# Patient Record
Sex: Female | Born: 1950 | Race: Black or African American | Hispanic: No | Smoking: Former smoker
Health system: Southern US, Community
[De-identification: ages and names within clinical notes are randomized; demographics above are authoritative.]

## PROBLEM LIST (undated history)

## (undated) DIAGNOSIS — I272 Pulmonary hypertension, unspecified: Secondary | ICD-10-CM

## (undated) DIAGNOSIS — M1990 Unspecified osteoarthritis, unspecified site: Secondary | ICD-10-CM

## (undated) DIAGNOSIS — I2510 Atherosclerotic heart disease of native coronary artery without angina pectoris: Secondary | ICD-10-CM

## (undated) DIAGNOSIS — E1169 Type 2 diabetes mellitus with other specified complication: Secondary | ICD-10-CM

## (undated) DIAGNOSIS — I5022 Chronic systolic (congestive) heart failure: Secondary | ICD-10-CM

## (undated) DIAGNOSIS — R0600 Dyspnea, unspecified: Secondary | ICD-10-CM

## (undated) DIAGNOSIS — IMO0002 Reserved for concepts with insufficient information to code with codable children: Secondary | ICD-10-CM

## (undated) DIAGNOSIS — I10 Essential (primary) hypertension: Secondary | ICD-10-CM

## (undated) DIAGNOSIS — E669 Obesity, unspecified: Secondary | ICD-10-CM

## (undated) DIAGNOSIS — R0601 Orthopnea: Secondary | ICD-10-CM

## (undated) DIAGNOSIS — I340 Nonrheumatic mitral (valve) insufficiency: Secondary | ICD-10-CM

## (undated) DIAGNOSIS — E785 Hyperlipidemia, unspecified: Secondary | ICD-10-CM

## (undated) DIAGNOSIS — G5603 Carpal tunnel syndrome, bilateral upper limbs: Secondary | ICD-10-CM

## (undated) DIAGNOSIS — R9430 Abnormal result of cardiovascular function study, unspecified: Secondary | ICD-10-CM

## (undated) DIAGNOSIS — I255 Ischemic cardiomyopathy: Secondary | ICD-10-CM

## (undated) DIAGNOSIS — I509 Heart failure, unspecified: Secondary | ICD-10-CM

## (undated) DIAGNOSIS — J90 Pleural effusion, not elsewhere classified: Secondary | ICD-10-CM

## (undated) DIAGNOSIS — I219 Acute myocardial infarction, unspecified: Secondary | ICD-10-CM

## (undated) HISTORY — DX: Type 2 diabetes mellitus with other specified complication: E11.69

## (undated) HISTORY — DX: Acute myocardial infarction, unspecified: I21.9

## (undated) HISTORY — DX: Pulmonary hypertension, unspecified: I27.20

## (undated) HISTORY — DX: Reserved for concepts with insufficient information to code with codable children: IMO0002

## (undated) HISTORY — DX: Morbid (severe) obesity due to excess calories: E66.01

## (undated) HISTORY — DX: Ischemic cardiomyopathy: I25.5

## (undated) HISTORY — PX: LAPAROSCOPY: SHX197

## (undated) HISTORY — DX: Chronic systolic (congestive) heart failure: I50.22

## (undated) HISTORY — DX: Hyperlipidemia, unspecified: E78.5

## (undated) HISTORY — PX: CHOLECYSTECTOMY: SHX55

## (undated) HISTORY — DX: Atherosclerotic heart disease of native coronary artery without angina pectoris: I25.10

## (undated) HISTORY — DX: Abnormal result of cardiovascular function study, unspecified: R94.30

## (undated) HISTORY — DX: Heart failure, unspecified: I50.9

## (undated) HISTORY — DX: Obesity, unspecified: E66.9

---

## 2011-08-09 ENCOUNTER — Inpatient Hospital Stay (HOSPITAL_COMMUNITY): Payer: Managed Care, Other (non HMO)

## 2011-08-09 ENCOUNTER — Inpatient Hospital Stay (HOSPITAL_COMMUNITY)
Admission: EM | Admit: 2011-08-09 | Discharge: 2011-08-26 | DRG: 231 | Disposition: A | Discharge status: Home Health | Payer: Managed Care, Other (non HMO) | Attending: Thoracic Surgery (Cardiothoracic Vascular Surgery) | Admitting: Thoracic Surgery (Cardiothoracic Vascular Surgery)

## 2011-08-09 ENCOUNTER — Emergency Department (HOSPITAL_COMMUNITY): Payer: Managed Care, Other (non HMO)

## 2011-08-09 DIAGNOSIS — E785 Hyperlipidemia, unspecified: Secondary | ICD-10-CM | POA: Diagnosis present

## 2011-08-09 DIAGNOSIS — D72829 Elevated white blood cell count, unspecified: Secondary | ICD-10-CM | POA: Diagnosis not present

## 2011-08-09 DIAGNOSIS — I509 Heart failure, unspecified: Secondary | ICD-10-CM | POA: Diagnosis present

## 2011-08-09 DIAGNOSIS — I2510 Atherosclerotic heart disease of native coronary artery without angina pectoris: Secondary | ICD-10-CM | POA: Diagnosis present

## 2011-08-09 DIAGNOSIS — I214 Non-ST elevation (NSTEMI) myocardial infarction: Principal | ICD-10-CM | POA: Diagnosis present

## 2011-08-09 DIAGNOSIS — E1169 Type 2 diabetes mellitus with other specified complication: Secondary | ICD-10-CM | POA: Diagnosis present

## 2011-08-09 DIAGNOSIS — J9819 Other pulmonary collapse: Secondary | ICD-10-CM | POA: Diagnosis present

## 2011-08-09 DIAGNOSIS — I472 Ventricular tachycardia, unspecified: Secondary | ICD-10-CM | POA: Diagnosis present

## 2011-08-09 DIAGNOSIS — I5023 Acute on chronic systolic (congestive) heart failure: Secondary | ICD-10-CM | POA: Diagnosis present

## 2011-08-09 DIAGNOSIS — D62 Acute posthemorrhagic anemia: Secondary | ICD-10-CM | POA: Diagnosis not present

## 2011-08-09 DIAGNOSIS — K089 Disorder of teeth and supporting structures, unspecified: Secondary | ICD-10-CM | POA: Diagnosis present

## 2011-08-09 DIAGNOSIS — E876 Hypokalemia: Secondary | ICD-10-CM | POA: Diagnosis present

## 2011-08-09 DIAGNOSIS — N390 Urinary tract infection, site not specified: Secondary | ICD-10-CM | POA: Diagnosis not present

## 2011-08-09 DIAGNOSIS — K5900 Constipation, unspecified: Secondary | ICD-10-CM | POA: Diagnosis not present

## 2011-08-09 DIAGNOSIS — E7800 Pure hypercholesterolemia, unspecified: Secondary | ICD-10-CM | POA: Diagnosis present

## 2011-08-09 DIAGNOSIS — I959 Hypotension, unspecified: Secondary | ICD-10-CM | POA: Diagnosis not present

## 2011-08-09 DIAGNOSIS — B961 Klebsiella pneumoniae [K. pneumoniae] as the cause of diseases classified elsewhere: Secondary | ICD-10-CM | POA: Diagnosis not present

## 2011-08-09 DIAGNOSIS — I519 Heart disease, unspecified: Secondary | ICD-10-CM | POA: Diagnosis present

## 2011-08-09 DIAGNOSIS — Z87891 Personal history of nicotine dependence: Secondary | ICD-10-CM

## 2011-08-09 DIAGNOSIS — I4729 Other ventricular tachycardia: Secondary | ICD-10-CM | POA: Diagnosis present

## 2011-08-09 DIAGNOSIS — R079 Chest pain, unspecified: Secondary | ICD-10-CM

## 2011-08-09 LAB — COMPREHENSIVE METABOLIC PANEL
ALT: 40 U/L — ABNORMAL HIGH (ref 0–35)
AST: 66 U/L — ABNORMAL HIGH (ref 0–37)
Albumin: 3.9 g/dL (ref 3.5–5.2)
Alkaline Phosphatase: 133 U/L — ABNORMAL HIGH (ref 39–117)
CO2: 24 mEq/L (ref 19–32)
Chloride: 98 mEq/L (ref 96–112)
GFR calc non Af Amer: >60 mL/min (ref >60)
Potassium: 4.1 mEq/L (ref 3.5–5.1)
Total Bilirubin: 0.6 mg/dL (ref 0.3–1.2)

## 2011-08-09 LAB — DIFFERENTIAL
Basophils Absolute: 0 10*3/uL (ref 0.0–0.1)
Eosinophils Absolute: 0 10*3/uL (ref 0.0–0.7)
Eosinophils Relative: 0 % (ref 0–5)
Lymphocytes Relative: 9 % — ABNORMAL LOW (ref 12–46)
Lymphs Abs: 0.7 10*3/uL (ref 0.7–4.0)
Neutrophils Relative %: 89 % — ABNORMAL HIGH (ref 43–77)

## 2011-08-09 LAB — CBC
Platelets: 267 10*3/uL (ref 150–400)
RBC: 5.04 MIL/uL (ref 3.87–5.11)
RDW: 12.7 % (ref 11.5–15.5)
WBC: 8.3 10*3/uL (ref 4.0–10.5)

## 2011-08-09 LAB — PROTIME-INR
INR: 0.98 (ref 0.00–1.49)
Prothrombin Time: 13.2 seconds (ref 11.6–15.2)

## 2011-08-09 LAB — LIPID PANEL
HDL: 61 mg/dL (ref >39)
LDL Cholesterol: 294 mg/dL — ABNORMAL HIGH (ref 0–99)
Total CHOL/HDL Ratio: 6.1 RATIO
Triglycerides: 94 mg/dL (ref <150)
VLDL: 19 mg/dL (ref 0–40)

## 2011-08-09 LAB — CK TOTAL AND CKMB (NOT AT ARMC)
Relative Index: 9.9 — ABNORMAL HIGH (ref 0.0–2.5)
Relative Index: 10 — ABNORMAL HIGH (ref 0.0–2.5)

## 2011-08-09 LAB — TROPONIN I: Troponin I: 3.77 ng/mL (ref <0.30)

## 2011-08-09 LAB — MAGNESIUM: Magnesium: 1.8 mg/dL (ref 1.5–2.5)

## 2011-08-10 DIAGNOSIS — E1169 Type 2 diabetes mellitus with other specified complication: Secondary | ICD-10-CM

## 2011-08-10 DIAGNOSIS — E669 Obesity, unspecified: Secondary | ICD-10-CM

## 2011-08-10 DIAGNOSIS — I059 Rheumatic mitral valve disease, unspecified: Secondary | ICD-10-CM

## 2011-08-10 DIAGNOSIS — I2510 Atherosclerotic heart disease of native coronary artery without angina pectoris: Secondary | ICD-10-CM

## 2011-08-10 DIAGNOSIS — E785 Hyperlipidemia, unspecified: Secondary | ICD-10-CM

## 2011-08-10 HISTORY — DX: Type 2 diabetes mellitus with other specified complication: E66.9

## 2011-08-10 HISTORY — DX: Type 2 diabetes mellitus with other specified complication: E11.69

## 2011-08-10 HISTORY — DX: Hyperlipidemia, unspecified: E78.5

## 2011-08-10 LAB — CARDIAC PANEL(CRET KIN+CKTOT+MB+TROPI)
CK, MB: 118.7 ng/mL (ref 0.3–4.0)
Total CK: 2426 U/L — ABNORMAL HIGH (ref 7–177)
Troponin I: >25 ng/mL (ref <0.30)

## 2011-08-10 LAB — GLUCOSE, CAPILLARY
Glucose-Capillary: 320 mg/dL — ABNORMAL HIGH (ref 70–99)
Glucose-Capillary: 252 mg/dL — ABNORMAL HIGH (ref 70–99)
Glucose-Capillary: 297 mg/dL — ABNORMAL HIGH (ref 70–99)

## 2011-08-10 LAB — BASIC METABOLIC PANEL
CO2: 27 mEq/L (ref 19–32)
Chloride: 100 mEq/L (ref 96–112)
Creatinine, Ser: 0.68 mg/dL (ref 0.50–1.10)
Glucose, Bld: 343 mg/dL — ABNORMAL HIGH (ref 70–99)

## 2011-08-10 LAB — CBC
MCHC: 35 g/dL (ref 30.0–36.0)
MCV: 88.9 fL (ref 78.0–100.0)
Platelets: 232 10*3/uL (ref 150–400)
RDW: 12.7 % (ref 11.5–15.5)
WBC: 12.1 10*3/uL — ABNORMAL HIGH (ref 4.0–10.5)

## 2011-08-10 LAB — LIPID PANEL
HDL: 59 mg/dL (ref >39)
Total CHOL/HDL Ratio: 5.8 RATIO

## 2011-08-10 LAB — PRO B NATRIURETIC PEPTIDE: Pro B Natriuretic peptide (BNP): 1852 pg/mL — ABNORMAL HIGH (ref 0–125)

## 2011-08-10 LAB — URINALYSIS, DIPSTICK ONLY
Ketones, ur: NEGATIVE mg/dL
Protein, ur: NEGATIVE mg/dL
Urobilinogen, UA: 1 mg/dL (ref 0.0–1.0)

## 2011-08-10 LAB — HEPARIN LEVEL (UNFRACTIONATED): Heparin Unfractionated: 0.43 IU/mL (ref 0.30–0.70)

## 2011-08-10 LAB — HEMOGLOBIN A1C: Mean Plasma Glucose: 249 mg/dL — ABNORMAL HIGH (ref <117)

## 2011-08-11 ENCOUNTER — Inpatient Hospital Stay (HOSPITAL_COMMUNITY): Payer: Managed Care, Other (non HMO)

## 2011-08-11 LAB — COMPREHENSIVE METABOLIC PANEL
ALT: 50 U/L — ABNORMAL HIGH (ref 0–35)
AST: 188 U/L — ABNORMAL HIGH (ref 0–37)
Alkaline Phosphatase: 94 U/L (ref 39–117)
CO2: 31 mEq/L (ref 19–32)
Calcium: 9 mg/dL (ref 8.4–10.5)
GFR calc Af Amer: >60 mL/min (ref >60)
GFR calc non Af Amer: >60 mL/min (ref >60)
Glucose, Bld: 157 mg/dL — ABNORMAL HIGH (ref 70–99)
Potassium: 3.3 mEq/L — ABNORMAL LOW (ref 3.5–5.1)
Sodium: 136 mEq/L (ref 135–145)

## 2011-08-11 LAB — BLOOD GAS, ARTERIAL
Acid-Base Excess: 5.9 mmol/L — ABNORMAL HIGH (ref 0.0–2.0)
FIO2: 0.4 %
MECHVT: 550 mL
O2 Content: 3 L/min
O2 Saturation: 90.6 %
O2 Saturation: 97.7 %
PEEP: 5 cmH2O
Patient temperature: 98.6
Patient temperature: 99.3
RATE: 14 resp/min
pH, Arterial: 7.457 — ABNORMAL HIGH (ref 7.350–7.400)
pO2, Arterial: 59.6 mmHg — ABNORMAL LOW (ref 80.0–100.0)

## 2011-08-11 LAB — CBC
Hemoglobin: 13.3 g/dL (ref 12.0–15.0)
Platelets: 214 10*3/uL (ref 150–400)
RBC: 4.29 MIL/uL (ref 3.87–5.11)
WBC: 9.7 10*3/uL (ref 4.0–10.5)

## 2011-08-11 LAB — PROTIME-INR
INR: 1.11 (ref 0.00–1.49)
Prothrombin Time: 14.5 seconds (ref 11.6–15.2)

## 2011-08-11 LAB — GLUCOSE, CAPILLARY
Glucose-Capillary: 299 mg/dL — ABNORMAL HIGH (ref 70–99)
Glucose-Capillary: 187 mg/dL — ABNORMAL HIGH (ref 70–99)
Glucose-Capillary: 144 mg/dL — ABNORMAL HIGH (ref 70–99)
Glucose-Capillary: 171 mg/dL — ABNORMAL HIGH (ref 70–99)
Glucose-Capillary: 152 mg/dL — ABNORMAL HIGH (ref 70–99)

## 2011-08-11 LAB — APTT: aPTT: 52 seconds — ABNORMAL HIGH (ref 24–37)

## 2011-08-11 NOTE — Consult Note (Signed)
Christine Knight, Christine Knight NO.:  [id number]  MEDICAL RECORD NO.:  [id number]  LOCATION:  2903                         FACILITY:  [facility]  PHYSICIAN:  [name], M.D. DATE OF BIRTH:  October 17, 1951  DATE OF CONSULTATION:  08/10/2011 DATE OF DISCHARGE:                                CONSULTATION   REQUESTING PHYSICIAN:  Dr. Deloris Ping. Nahser  REASON FOR CONSULTATION:  Severe three-vessel coronary artery disease status post acute myocardial infarction.  HISTORY OF PRESENT ILLNESS:  The patient is a 60 year old obese African American female from [place] who has not seen a physician in many years.  The patient denies any known history of cardiac disease, and she also denies any history of diabetes, hypertension, or hyperlipidemia. The patient states she was in her usual state of health until 3 days ago when she first began to develop severe substernal chest pain and epigastric pain that initially she attributed to indigestion.  The painwas associated with nausea, vomiting, and diarrhea.  The pain persisted for the next 48 hours without relief and began to become associated with shortness of breath.  She finally presented to the hospital where baseline electrocardiograms revealed sinus rhythm with nonspecific ST and T-wave changes.  However, cardiac enzymes were markedly elevated with troponin levels greater than 25.  The patient was admitted to the Cardiology Service.  She continued to have chest pain off and on and was subsequently brought to the cardiac cath lab earlier today by Dr. Elease Hashimoto and Dr. Nanetta Batty.  The patient was found to have severe three- vessel coronary artery disease with subtotal occlusion of the left circumflex coronary artery representing likely culprit vessel.  The patient underwent primary balloon angioplasty of the left circumflex coronary artery with good result.  Intra-aortic balloon pump was placed during the procedure.  The patient  was noted to have severe left ventricular dysfunction.  Cardiothoracic surgical consultation was requested.  REVIEW OF SYSTEMS:  The patient claims that she was in her usual state of health until 3 days ago.  She states that prior to that she had good energy and normal appetite.  CARDIAC:  The patient does admit that in retrospect she has been having intermittent episodic pain in her lower chest and epigastric region that she has attributed to indigestion. This has been going off and on for sometime.  The patient has stable mild exertional shortness of breath prior to this acute event.  Her breathing is much better since she returned from the cath lab today. She denies resting shortness of breath, PND, orthopnea, or syncope. RESPIRATORY:  Notable for some recent productive cough.  The patient states she had a viral illness.  She denies hemoptysis or wheezing. GASTROINTESTINAL:  The patient had nausea, vomiting, and diarrhea during her acute illness over the last few days.  She denies any ongoing abdominal pain at present and states that her bowels have settled down. She denies any history of difficulty swallowing.  NEUROLOGIC:  Negative. The patient denies symptoms suggestive of previous TIA or stroke. GENITOURINARY:  Negative.  HEENT:  Negative.  INFECTIOUS:  Negative.  PAST MEDICAL HISTORY:  The patient denies any known history of coronary artery disease, hypertension,  hyperlipidemia, or diabetes.  Since hospital admission, the patient has been found to have completely uncontrolled type 2 diabetes mellitus as well as severe hyperlipidemia and congestive heart failure.  The patient also is morbidly obese.  PAST SURGICAL HISTORY:  Cholecystectomy.  FAMILY HISTORY:  Noncontributory.  MEDICATIONS PRIOR TO ADMISSION:  None other than over-the-counter Mucinex.  DRUG ALLERGIES:  None known.  SOCIAL HISTORY:  The patient is a morbidly obese African American female who lives in  [place].  She works for [employer] [place].  She has a remote history of tobacco use but quit smoking 30 years ago.  She does not use alcohol.  She lives a sedentary lifestyle.  She lives with her son.  PHYSICAL EXAMINATION:  GENERAL:  The patient is morbidly obese African American female who appears her stated age in no acute distress.  She is in sinus rhythm. HEENT:  Grossly unrevealing. NECK:  Supple.  There are no carotid bruits. CHEST:  Auscultation of the chest demonstrates coarse rhonchi in both lung fields.  Breath sounds are symmetrical. CARDIOVASCULAR:  Notable for regular rate and rhythm.  No murmurs, rubs, or gallops noted. ABDOMEN:  Obese but soft, nontender.  Bowel sounds are present. EXTREMITIES:  Warm and adequately perfused.  There is mild bilateral lower extremity edema.  Distal pulses are not palpable in either lower leg.  Intra-aortic balloon pump was in place and functioning normally.  DIAGNOSTIC TEST:  A 2-D echocardiogram and left heart catheterization performed earlier today are both reviewed.  Echocardiogram demonstrates severe left ventricular dysfunction with akinesis of the inferior and lateral wall.  There is mild mitral regurgitation.  There is severe left ventricular chamber enlargement with severe left ventricular dysfunction.  The aortic valve appears normal.  Cardiac catheterization demonstrates severe three-vessel coronary artery disease with severe left ventricular dysfunction.  There is long segment 90% stenosis of the mid-left anterior descending coronary artery beginning just prior to and after takeoff of the diagonal branch.  There is 90% ostial stenosis of the diagonal branch.  The distal left anterior descending coronary artery is diffusely diseased and has numerous areas of high-grade narrowing.  The left circumflex coronary artery gives rise to a single large dominant obtuse marginal branch.  There is 99% subtotal occlusion of the proximal  vessel with thrombus and TIMI 2 flow in the distal vessel.  There is right dominant coronary circulation. There is long segment 70-80% stenosis of the proximal right coronary artery with 100% occlusion of the distal right coronary artery.  The posterior descending coronary artery and the terminal branch of the right coronary artery appeared diffusely disease and fill only via collaterals.  There is severe left ventricular dysfunction with ejection fraction estimated less than or equal to 20%.  Left ventricular end- diastolic pressure is 37.  There may be some mitral regurgitation.  IMPRESSION:  Severe three-vessel coronary artery disease with ischemic cardiomyopathy, status post acute lateral wall myocardial infarction, complicated by congestive heart failure.  The patient has undergone successful primary balloon angioplasty of the culprit vessel and is currently pain free and appears substantially improved.  She clearly has likely preexisting ischemic cardiomyopathy with previous occlusion of the right coronary artery in the remote past and diffuse disease involving the left anterior descending coronary artery as well.  The patient has relatively poor target vessels for grafting and risks associated with surgical revascularization will be exceptionally high. Unfortunately, there are no alternatives as the outlook with primary medical therapy would be dismal and there  are no reasonable options for any other percutaneous approach to her revascularization.  I agree that she needs coronary artery bypass grafting.  At this point, the only question will be timing in that she would clearly benefit from time to recover from her acute event as long as she remains clinically stable. Finally, she suffers from morbid obesity as well as completely uncontrolled diabetes mellitus and hyperlipidemia.  Her medical problems will be of critical importance if she is able to survive  surgical revascularization as her long-term outlook will still be remain guarded at best under any circumstances.  PLAN:  I have discussed matters at length with Ms. Tortora and her family at the bedside.  The rationale for surgical revascularization has been discussed.  The high-risk nature of the surgery has been discussed. All their questions have been addressed.  She understands and accepts all potential associated risks including but not limited to risk of death, stroke, myocardial infarction, congestive heart failure, respiratory failure, renal failure, bleeding requiring blood transfusion, arrhythmia, infection, late recurrence of coronary artery disease and/or congestive heart failure.  We will continue to follow along closely, make final plans for surgery depending on her clinical course in the near future.     [name], M.D.     CHO/MEDQ  D:  08/10/2011  T:  08/10/2011  Job:  [id number]  cc:   Luis Abed, MD, [facility] Nanetta Batty, M.D. Vesta Mixer, M.D.  Electronically Signed by Tressie Stalker M.D. on 08/11/2011 08:08:00 AM

## 2011-08-11 NOTE — Cardiovascular Report (Signed)
Christine Knight, DALIA NO.:  [id number]  MEDICAL RECORD NO.:  [id number]  LOCATION:  2903                         FACILITY:  [facility]  PHYSICIAN:  Nanetta Batty, M.D.   DATE OF BIRTH:  [birth year]  DATE OF PROCEDURE:  08/10/2011 DATE OF DISCHARGE:                           CARDIAC CATHETERIZATION   PROCEDURES:  Cardiac catheterization/aspiration thrombectomy/cutting balloon atherectomy/placement of intra-aortic balloon pump.  SURGEON:  Nanetta Batty, MD  INDICATIONS:  Ms. Aguado is a 60 year old mildly overweight African American female without prior cardiac history who presented to [facility] on August 10, 2011 with nausea, vomiting, and abdominal pain.  She ultimately ends up involving a non-ST-segment elevation myocardial infarction with no EKG changes.  Because of recurrent pain, she was brought to the cath lab urgently for angiography and potential intervention.  DESCRIPTION OF PROCEDURE:  The patient was brought to the second floor [place] cardiac cath lab urgently in the postabsorptive state.  She was premedicated with p.o. Valium, IV Versed, and fentanyl.  She had received aspirin prior to coming to the cath lab.  Right groin was prepped and shaved in the usual sterile fashion.  Xylocaine 1% was used for local anesthesia.  A 6-French sheath was inserted into the right femoral artery using standard Seldinger technique.  A 6-French right and left Judkins diagnostic catheters as well as French pigtail catheter were used for selective coronary angiography, left ventriculography, subselective left internal mammary artery angiography, and the distal abdominal aortography.  Visipaque dye was used to the entirety of the case.  Retrograde aortic, left ventricular, and pullback pressures were recorded.  HEMODYNAMIC RESULTS: 1. Aortic systolic pressure 111, diastolic pressure 77. 2. Left ventricular systolic pressure 105, end-diastolic pressure  33.  SELECTIVE CORONARY ANGIOGRAPHY: 1. Left main normal. 2. LAD; the LAD had a long segmental 80-90% stenosis in the midportion     at the takeoff of a moderate-sized first diagonal branch which had     a 95% ostial stenosis and 50% segmental proximal stenosis.  There     was 80% segmental stenosis just between the mid and distal third of     the vessel. 3. Left circumflex; this was the "infarct-related vessel" with 99%     stenosis in the mid AV groove with visible thrombus and TIMI 3     flow. 4. Right coronary artery; dominant with an occluded distal RCA with     bidirectional collaterals. 5. Left internal mammary artery; this was subselectively visualized     and this was widely patent.  Suitable for use during coronary     bypass grafting if required. 6. Left ventriculography; RAO left ventriculogram was performed using     20 mL of Visipaque dye at 12 mL per second.  The overall LVEF was     estimated approximately 20% with severe global hypokinesia. 7. Distal abdominal aortography; distal abdominal aortogram was     performed using 20 mL of Visipaque dye at 20 mL per second.  The     renal arteries were widely patent.  The infrarenal abdominal aorta     and iliac bifurcation were free of any significant atherosclerotic     changes and suitable for  placement of an intra-aortic balloon pump     if necessary.  IMPRESSION:  Ms. Flaim has severe three-vessel disease with severe LV dysfunction.  She will need coronary bypass grafting.  Her culprit vessel is her mid circumflex which is a large vessel with TIMI 2 flow. I have discussed the case with Dr. Elease Hashimoto, her attending cardiologist. The plan is to perform percutaneous intervention with aspiration thrombectomy to reestablish antegrade flow in anticipation of coronary bypass grafting.  Intra-aortic balloon pump will be placed for afterload reduction and to augment coronary blood flow.  DESCRIPTION OF PROCEDURE:  The patient  received Angiomax bolus with an ACT of 389.  Total of 190 mL of contrast was administered during the case.  Using a 6-French [id number] guide catheter along with an 0.014 x 190 Asahi soft wire and a 2.0 x 15 Emerge balloon angioplasty was performed. Following this, aspiration thrombectomy was performed with a Pronto aspiration device.  Cutting balloon atherectomy was then performed with a 2.5 x 15 angioscope resulting reduction of a 99% mid AV groove circumflex with TIMI 2 flow to less than 30% with TIMI 3 flow.  The patient tolerated the procedure well.  At the termination of the case, an intra-aortic balloon pump was placed via the right femoral artery at 1:1 counter pulsation.  The patient received 40 mg of Lasix intravenously and a Foley catheter was placed in the cath lab.  Angiomax was turned off and the patient got a bolus of Integrilin and was placed on an Integrilin drip.  She left the lab in stable condition.     Nanetta Batty, M.D.     Cordelia Pen  D:  08/10/2011  T:  08/10/2011  Job:  [id number]  cc:   Second Floor [facility] Vesta Mixer, M.D.  Electronically Signed by Nanetta Batty M.D. on 08/11/2011 11:53:37 AM

## 2011-08-12 DIAGNOSIS — I2510 Atherosclerotic heart disease of native coronary artery without angina pectoris: Secondary | ICD-10-CM

## 2011-08-12 DIAGNOSIS — Z01810 Encounter for preprocedural cardiovascular examination: Secondary | ICD-10-CM

## 2011-08-12 DIAGNOSIS — I219 Acute myocardial infarction, unspecified: Secondary | ICD-10-CM

## 2011-08-12 HISTORY — DX: Acute myocardial infarction, unspecified: I21.9

## 2011-08-12 HISTORY — DX: Atherosclerotic heart disease of native coronary artery without angina pectoris: I25.10

## 2011-08-12 LAB — BASIC METABOLIC PANEL
BUN: 10 mg/dL (ref 6–23)
CO2: 29 mEq/L (ref 19–32)
Calcium: 8.7 mg/dL (ref 8.4–10.5)
Glucose, Bld: 116 mg/dL — ABNORMAL HIGH (ref 70–99)
Sodium: 135 mEq/L (ref 135–145)

## 2011-08-12 LAB — CBC
HCT: 36.7 % (ref 36.0–46.0)
Hemoglobin: 12.6 g/dL (ref 12.0–15.0)
MCH: 30.9 pg (ref 26.0–34.0)
MCHC: 34.3 g/dL (ref 30.0–36.0)
MCV: 90 fL (ref 78.0–100.0)
RDW: 12.6 % (ref 11.5–15.5)

## 2011-08-12 LAB — GLUCOSE, CAPILLARY
Glucose-Capillary: 128 mg/dL — ABNORMAL HIGH (ref 70–99)
Glucose-Capillary: 116 mg/dL — ABNORMAL HIGH (ref 70–99)
Glucose-Capillary: 124 mg/dL — ABNORMAL HIGH (ref 70–99)
Glucose-Capillary: 173 mg/dL — ABNORMAL HIGH (ref 70–99)

## 2011-08-12 LAB — URINE CULTURE: Culture: NO GROWTH

## 2011-08-12 LAB — SURGICAL PCR SCREEN: MRSA, PCR: NEGATIVE

## 2011-08-13 ENCOUNTER — Inpatient Hospital Stay (HOSPITAL_COMMUNITY): Payer: Managed Care, Other (non HMO)

## 2011-08-13 DIAGNOSIS — I255 Ischemic cardiomyopathy: Secondary | ICD-10-CM | POA: Insufficient documentation

## 2011-08-13 DIAGNOSIS — I2510 Atherosclerotic heart disease of native coronary artery without angina pectoris: Secondary | ICD-10-CM

## 2011-08-13 HISTORY — DX: Ischemic cardiomyopathy: I25.5

## 2011-08-13 HISTORY — PX: CORONARY ARTERY BYPASS GRAFT: SHX141

## 2011-08-13 LAB — POCT I-STAT 3, ART BLOOD GAS (G3+)
Acid-base deficit: 4 mmol/L — ABNORMAL HIGH (ref 0.0–2.0)
Acid-base deficit: 2 mmol/L (ref 0.0–2.0)
Bicarbonate: 24.5 mEq/L — ABNORMAL HIGH (ref 20.0–24.0)
O2 Saturation: 90 %
O2 Saturation: 99 %
O2 Saturation: 99 %
Patient temperature: 37.1
TCO2: 26 mmol/L (ref 0–100)
TCO2: 22 mmol/L (ref 0–100)
TCO2: 24 mmol/L (ref 0–100)
pCO2 arterial: 40.3 mmHg (ref 35.0–45.0)
pCO2 arterial: 31.1 mmHg — ABNORMAL LOW (ref 35.0–45.0)
pCO2 arterial: 39.5 mmHg (ref 35.0–45.0)
pH, Arterial: 7.403 — ABNORMAL HIGH (ref 7.350–7.400)
pH, Arterial: 7.359 (ref 7.350–7.400)
pH, Arterial: 7.374 (ref 7.350–7.400)
pO2, Arterial: 71 mmHg — ABNORMAL LOW (ref 80.0–100.0)
pO2, Arterial: 131 mmHg — ABNORMAL HIGH (ref 80.0–100.0)

## 2011-08-13 LAB — CBC
HCT: 37.9 % (ref 36.0–46.0)
Hemoglobin: 13.3 g/dL (ref 12.0–15.0)
Hemoglobin: 11.9 g/dL — ABNORMAL LOW (ref 12.0–15.0)
Hemoglobin: 10.3 g/dL — ABNORMAL LOW (ref 12.0–15.0)
MCH: 31.4 pg (ref 26.0–34.0)
MCH: 30.7 pg (ref 26.0–34.0)
MCH: 30.7 pg (ref 26.0–34.0)
MCHC: 35.1 g/dL (ref 30.0–36.0)
MCHC: 34.1 g/dL (ref 30.0–36.0)
MCV: 89.4 fL (ref 78.0–100.0)
MCV: 88.1 fL (ref 78.0–100.0)
Platelets: 143 10*3/uL — ABNORMAL LOW (ref 150–400)
RBC: 3.36 MIL/uL — ABNORMAL LOW (ref 3.87–5.11)
RDW: 12.5 % (ref 11.5–15.5)
RDW: 12.5 % (ref 11.5–15.5)
WBC: 12.3 10*3/uL — ABNORMAL HIGH (ref 4.0–10.5)

## 2011-08-13 LAB — POCT I-STAT 4, (NA,K, GLUC, HGB,HCT)
Glucose, Bld: 97 mg/dL (ref 70–99)
Glucose, Bld: 85 mg/dL (ref 70–99)
Glucose, Bld: 119 mg/dL — ABNORMAL HIGH (ref 70–99)
Glucose, Bld: 158 mg/dL — ABNORMAL HIGH (ref 70–99)
Glucose, Bld: 162 mg/dL — ABNORMAL HIGH (ref 70–99)
HCT: 36 % (ref 36.0–46.0)
HCT: 35 % — ABNORMAL LOW (ref 36.0–46.0)
Hemoglobin: 12.2 g/dL (ref 12.0–15.0)
Hemoglobin: 9.2 g/dL — ABNORMAL LOW (ref 12.0–15.0)
Hemoglobin: 9.2 g/dL — ABNORMAL LOW (ref 12.0–15.0)
Hemoglobin: 11.9 g/dL — ABNORMAL LOW (ref 12.0–15.0)
Potassium: 4 mEq/L (ref 3.5–5.1)
Potassium: 3.8 mEq/L (ref 3.5–5.1)
Potassium: 5.1 mEq/L (ref 3.5–5.1)
Potassium: 4.7 mEq/L (ref 3.5–5.1)
Potassium: 4.4 mEq/L (ref 3.5–5.1)
Sodium: 137 mEq/L (ref 135–145)
Sodium: 133 mEq/L — ABNORMAL LOW (ref 135–145)
Sodium: 132 mEq/L — ABNORMAL LOW (ref 135–145)
Sodium: 136 mEq/L (ref 135–145)

## 2011-08-13 LAB — POCT I-STAT, CHEM 8
BUN: 7 mg/dL (ref 6–23)
Creatinine, Ser: 0.5 mg/dL (ref 0.50–1.10)
Potassium: 4.2 mEq/L (ref 3.5–5.1)
Sodium: 139 mEq/L (ref 135–145)

## 2011-08-13 LAB — PLATELET COUNT: Platelets: 169 10*3/uL (ref 150–400)

## 2011-08-13 LAB — POCT I-STAT 3, VENOUS BLOOD GAS (G3P V)
Bicarbonate: 24.1 mEq/L — ABNORMAL HIGH (ref 20.0–24.0)
pCO2, Ven: 44 mmHg — ABNORMAL LOW (ref 45.0–50.0)
pH, Ven: 7.346 — ABNORMAL HIGH (ref 7.250–7.300)

## 2011-08-13 LAB — BASIC METABOLIC PANEL
BUN: 8 mg/dL (ref 6–23)
CO2: 26 mEq/L (ref 19–32)
Calcium: 9 mg/dL (ref 8.4–10.5)
Creatinine, Ser: 0.47 mg/dL — ABNORMAL LOW (ref 0.50–1.10)
GFR calc non Af Amer: >90 mL/min (ref >90)
Glucose, Bld: 87 mg/dL (ref 70–99)

## 2011-08-13 LAB — POCT I-STAT GLUCOSE: Operator id: 3342

## 2011-08-13 LAB — CREATININE, SERUM: Creatinine, Ser: <0.47 mg/dL — ABNORMAL LOW (ref 0.50–1.10)

## 2011-08-13 LAB — GLUCOSE, CAPILLARY
Glucose-Capillary: 95 mg/dL (ref 70–99)
Glucose-Capillary: 103 mg/dL — ABNORMAL HIGH (ref 70–99)

## 2011-08-13 LAB — MAGNESIUM: Magnesium: 2.9 mg/dL — ABNORMAL HIGH (ref 1.5–2.5)

## 2011-08-13 LAB — PROTIME-INR: Prothrombin Time: 16.9 seconds — ABNORMAL HIGH (ref 11.6–15.2)

## 2011-08-14 ENCOUNTER — Inpatient Hospital Stay (HOSPITAL_COMMUNITY): Payer: Managed Care, Other (non HMO)

## 2011-08-14 LAB — BASIC METABOLIC PANEL
BUN: 9 mg/dL (ref 6–23)
Calcium: 8.5 mg/dL (ref 8.4–10.5)
Chloride: 105 mEq/L (ref 96–112)
Creatinine, Ser: 0.48 mg/dL — ABNORMAL LOW (ref 0.50–1.10)
GFR calc Af Amer: >90 mL/min (ref >90)
GFR calc non Af Amer: >90 mL/min (ref >90)

## 2011-08-14 LAB — GLUCOSE, CAPILLARY
Glucose-Capillary: 114 mg/dL — ABNORMAL HIGH (ref 70–99)
Glucose-Capillary: 98 mg/dL (ref 70–99)
Glucose-Capillary: 112 mg/dL — ABNORMAL HIGH (ref 70–99)
Glucose-Capillary: 113 mg/dL — ABNORMAL HIGH (ref 70–99)
Glucose-Capillary: 106 mg/dL — ABNORMAL HIGH (ref 70–99)
Glucose-Capillary: 129 mg/dL — ABNORMAL HIGH (ref 70–99)

## 2011-08-14 LAB — CBC
HCT: 30.5 % — ABNORMAL LOW (ref 36.0–46.0)
MCHC: 33.8 g/dL (ref 30.0–36.0)
RDW: 12.4 % (ref 11.5–15.5)
WBC: 11.8 10*3/uL — ABNORMAL HIGH (ref 4.0–10.5)

## 2011-08-14 LAB — POCT I-STAT 3, ART BLOOD GAS (G3+)
Acid-base deficit: 2 mmol/L (ref 0.0–2.0)
O2 Saturation: 94 %
Patient temperature: 37.7

## 2011-08-14 LAB — MAGNESIUM: Magnesium: 2.4 mg/dL (ref 1.5–2.5)

## 2011-08-14 LAB — POCT I-STAT, CHEM 8
BUN: 8 mg/dL (ref 6–23)
Calcium, Ion: 1.16 mmol/L (ref 1.12–1.32)
Creatinine, Ser: 0.7 mg/dL (ref 0.50–1.10)
Glucose, Bld: 132 mg/dL — ABNORMAL HIGH (ref 70–99)
TCO2: 24 mmol/L (ref 0–100)

## 2011-08-14 LAB — APTT: aPTT: 37 seconds (ref 24–37)

## 2011-08-14 LAB — PROTIME-INR
INR: 1.32 (ref 0.00–1.49)
Prothrombin Time: 16.6 seconds — ABNORMAL HIGH (ref 11.6–15.2)

## 2011-08-14 NOTE — Op Note (Signed)
NAMEAMELIANNA, MELLER NO.:  [id number]  MEDICAL RECORD NO.:  [id number]  LOCATION:  2301                         FACILITY:  [facility]  PHYSICIAN:  [name], M.D. DATE OF BIRTH:  03/11/1951  DATE OF PROCEDURE:  08/13/2011 DATE OF DISCHARGE:                              OPERATIVE REPORT   PREOPERATIVE DIAGNOSIS:  Severe three-vessel coronary artery disease.  POSTOPERATIVE DIAGNOSIS:  Severe three-vessel coronary artery disease.  PROCEDURES:  Median sternotomy for coronary artery bypass grafting x4 (left internal mammary artery to distal left anterior descending coronary artery, saphenous vein graft to diagonal branch, saphenous vein graft to obtuse marginal branch of the left circumflex coronary artery, saphenous vein graft to posterior descending coronary artery, endoscopic saphenous vein harvest from bilateral thighs).  SURGEON:  [name], MD  ASSISTANT:  Kerin Perna, MD  SECOND ASSISTANT:  [name], PA  BRIEF CLINICAL NOTE:  The patient is a 60 year old morbidly obese African American female from [place] who had not sought any type of routine medical care in a long time.  The patient was admitted to the hospital on August 09, 2011, with an acute myocardial infarction complicated by congestive heart failure.  The patient ultimately underwent cardiac catheterization demonstrating severe three-vessel coronary artery disease with severe left ventricular dysfunction and ischemic cardiomyopathy.  There was subtotal occlusion of the left circumflex coronary artery and this was felt to represent the culprit vessel responsible for the patient's acute presentation.  The patient had primary balloon angioplasty of the high-grade circumflex lesion and intra-aortic balloon pump was placed.  She stabilized medically and improved.  A full consultation note has been dictated previously.  The patient is now brought to the operating room  for definitive surgical revascularization.  The patient and her family understand the high-risk nature of surgery.  They understand and accept all associated risks and desire to proceed as described.  OPERATIVE FINDINGS: 1. Severe left ventricular dysfunction with ischemic cardiomyopathy,     ejection fraction estimated less than or equal to 20%. 2. Mild mitral regurgitation. 3. Fair quality left internal mammary artery conduit for grafting. 4. Fair-to-good quality saphenous vein conduit for grafting. 5. Fair-to-good quality target vessels for grafting.  OPERATIVE PROCEDURE IN DETAIL:  The patient was brought to the operating room directly from the Cardiac Intensive Care Unit and placed in the supine position on the operating table.  Central monitoring was established by the Anesthesia team under the care and direction of Dr. Judie Petit.  Specifically, a Swan-Ganz catheter was placed through the right internal jugular approach.  A radial arterial line was placed. Intravenous antibiotics were administered.  The patient's preexisting intra-aortic balloon pump was monitored carefully.  General endotracheal anesthesia was induced uneventfully.  A Foley catheter was placed.  The patient's chest, abdomen, both groins, and both lower extremities were prepared and draped in a sterile manner.  Baseline transesophageal echocardiogram was performed by Dr. Randa Evens. This demonstrates severe left ventricular dysfunction.  There was mild mitral regurgitation.  No other significant abnormalities were noted.  A median sternotomy incision was performed and the left internal mammary artery was dissected from the chest wall and prepared for bypass grafting.  The  left internal mammary artery was slightly small-caliber, but otherwise fair-to-good quality conduit.  Simultaneously, saphenous vein is obtained from both the left and right thighs using endoscopic vein harvest technique through small  incision made just below both knees.  The saphenous vein was fair-to-good quality conduit.  After the saphenous vein has been removed, the small incisions in both lower extremities were closed with absorbable suture.  Following systemic heparinization, the left internal mammary artery was transected distally and noted to have adequate flow.  The pericardium was opened.  The ascending aorta was normal in appearance.  The ascending aorta and the right atrium were cannulated for cardiopulmonary bypass.  A retrograde cardioplegic cannula was placed through the right atrium into the coronary sinus.  Antegrade cardioplegic cannula was placed directly in the ascending aorta.  The patient was allowed to cool passively to 32 degrees systemic temperature.  The aortic crossclamp was applied and cold blood cardioplegia was delivered in an antegrade fashion through the aortic root.  Iced saline slush was applied for topical hypothermia.  The initial cardioplegic arrest was rapid with early diastolic arrest. Supplemental cardioplegia was administered retrograde through the coronary sinus catheter.  Repeat doses of cardioplegia were administered intermittently throughout the entire crossclamp portion of the operation through the aortic root, down the subsequently placed vein grafts, and retrograde through the coronary sinus catheter to maintain completely flat electrocardiogram and left ventricular septal myocardial temperature below 15 degrees centigrade.  Myocardial protection was felt to be excellent.  The following distal coronary anastomoses were performed: 1. The obtuse marginal branch off the left circumflex coronary artery     was grafted with a saphenous vein graft in an end-to-side fashion.     This vessel measures 2.0 mm in diameter and was a good-quality     target vessel at the site of distal grafting. 2. The posterior descending coronary artery was grafted with a     saphenous vein  graft in an end-to-side fashion.  This vessel     measured 1.5 mm in diameter and was a fair to good quality target     vessel for grafting. 3. The second diagonal branch of the left anterior descending coronary     artery was grafted with a saphenous vein graft in an end-to-side     fashion.  This vessel measured 1.5 mm in diameter and was a fair-to-     good quality target vessel for grafting. 4. The distal left anterior descending coronary artery was grafted     with left internal mammary artery in an end-to-side fashion.  This     vessel measured 1.7 mm in diameter and was a fair-to-good quality     target vessel at the site of distal grafting.  However, there was     diffuse disease both proximal and distal to this vessel, and     approximately 1 cm beyond the distal anastomoses, there was a     lesion that will not take a 1.5 probe, but a 1.0 probe will pass to     the apex.  All three proximal saphenous vein anastomoses were performed directly to the ascending aorta prior to removal of the aortic crossclamp.  The left ventricular septal temperature rises rapidly with reperfusion of the left internal mammary artery.  One final dose of warm retrograde hot shot cardioplegia was administered.  The aortic crossclamp was removed after total crossclamp time of 91 minutes.  Heart began to beat spontaneously without need for cardioversion.  All proximal and distal coronary anastomoses were inspect for hemostasis and appropriate graft orientation.  Epicardial pacing wires were affixed to the right ventricular outflow tract into the right atrial appendage.  Low-dose milrinone and dopamine infusions were begun.  The patient was rewarmed to 37 degrees centigrade temperature.  Initial attempts at separation from bypass were notable for the development of intracoronary air with transient worsened biventricular dysfunction.  The patient was rested on cardiopulmonary bypass for an additional  several minutes and intracoronary air passes.  The patient was subsequently weaned from cardiopulmonary bypass without difficulty.  The patient's rhythm at separation from bypass was sinus tachycardia.  The patient was weaned from bypass on dopamine at 5 mcg/kg/minute and milrinone at 0.3 mcg/kg/minute as well as intra-aortic balloon counterpulsation.  Total cardiopulmonary bypass time for the operation was 124 minutes.  Followup transesophageal echocardiogram was performed by Dr. Randa Evens after separation from bypass demonstrates some improvement in left ventricular function.  There remains mild mitral regurgitation.  No other abnormalities were noted.  The venous and arterial cannulae were removed uneventfully.  Protamine was administered to reverse the anticoagulation.  The mediastinum and the left chest were irrigated with saline solution.  Meticulous surgical hemostasis was ascertained.  The mediastinum and both left and right pleural spaces were drained with four chest tubes placed through separate stab incisions inferiorly.  The pericardium and soft tissues anterior to the aorta were reapproximated loosely.  The sternum was closed using double-strength sternal wire.  The soft tissues anterior to the sternum were closed in multiple layers and the skin was closed with a running subcuticular skin closure.  The patient tolerated the procedure well and was transported to the Surgical Intensive Care Unit in stable condition.  There were no intraoperative complications.  All sponge, instrument, and needle counts were verified and correct at completion of the operation.  No blood products were administered.     [name], M.D.     CHO/MEDQ  D:  08/13/2011  T:  08/14/2011  Job:  [id number]  cc:   Vesta Mixer, M.D. Nanetta Batty, M.D. Luis Abed, MD, [facility]  Electronically Signed by Tressie Stalker M.D. on 08/14/2011 09:32:14 AM

## 2011-08-15 ENCOUNTER — Inpatient Hospital Stay (HOSPITAL_COMMUNITY): Payer: Managed Care, Other (non HMO)

## 2011-08-15 LAB — CBC
HCT: 29.5 % — ABNORMAL LOW (ref 36.0–46.0)
Hemoglobin: 10.2 g/dL — ABNORMAL LOW (ref 12.0–15.0)
MCV: 87.8 fL (ref 78.0–100.0)
Platelets: 140 10*3/uL — ABNORMAL LOW (ref 150–400)
RBC: 3.36 MIL/uL — ABNORMAL LOW (ref 3.87–5.11)
WBC: 12.1 10*3/uL — ABNORMAL HIGH (ref 4.0–10.5)

## 2011-08-15 LAB — GLUCOSE, CAPILLARY
Glucose-Capillary: 162 mg/dL — ABNORMAL HIGH (ref 70–99)
Glucose-Capillary: 244 mg/dL — ABNORMAL HIGH (ref 70–99)
Glucose-Capillary: 163 mg/dL — ABNORMAL HIGH (ref 70–99)
Glucose-Capillary: 151 mg/dL — ABNORMAL HIGH (ref 70–99)

## 2011-08-15 LAB — TYPE AND SCREEN
Antibody Screen: NEGATIVE
Unit division: 0

## 2011-08-15 LAB — BASIC METABOLIC PANEL
BUN: 10 mg/dL (ref 6–23)
Calcium: 8.6 mg/dL (ref 8.4–10.5)
Chloride: 98 mEq/L (ref 96–112)
GFR calc non Af Amer: >90 mL/min (ref >90)
Potassium: 4.1 mEq/L (ref 3.5–5.1)

## 2011-08-15 LAB — POCT ACTIVATED CLOTTING TIME: Activated Clotting Time: 149 seconds

## 2011-08-16 ENCOUNTER — Inpatient Hospital Stay (HOSPITAL_COMMUNITY): Payer: Managed Care, Other (non HMO)

## 2011-08-16 LAB — GLUCOSE, CAPILLARY
Glucose-Capillary: 131 mg/dL — ABNORMAL HIGH (ref 70–99)
Glucose-Capillary: 140 mg/dL — ABNORMAL HIGH (ref 70–99)
Glucose-Capillary: 167 mg/dL — ABNORMAL HIGH (ref 70–99)
Glucose-Capillary: 204 mg/dL — ABNORMAL HIGH (ref 70–99)

## 2011-08-16 LAB — CBC
HCT: 28 % — ABNORMAL LOW (ref 36.0–46.0)
MCH: 30.2 pg (ref 26.0–34.0)
MCV: 87.2 fL (ref 78.0–100.0)
RBC: 3.21 MIL/uL — ABNORMAL LOW (ref 3.87–5.11)
WBC: 12.2 10*3/uL — ABNORMAL HIGH (ref 4.0–10.5)

## 2011-08-16 LAB — BASIC METABOLIC PANEL
BUN: 11 mg/dL (ref 6–23)
CO2: 34 mEq/L — ABNORMAL HIGH (ref 19–32)
Calcium: 8.5 mg/dL (ref 8.4–10.5)
Chloride: 96 mEq/L (ref 96–112)
Creatinine, Ser: <0.47 mg/dL — ABNORMAL LOW (ref 0.50–1.10)
Glucose, Bld: 108 mg/dL — ABNORMAL HIGH (ref 70–99)
Potassium: 3.4 mEq/L — ABNORMAL LOW (ref 3.5–5.1)
Sodium: 134 mEq/L — ABNORMAL LOW (ref 135–145)

## 2011-08-17 ENCOUNTER — Inpatient Hospital Stay (HOSPITAL_COMMUNITY): Payer: Managed Care, Other (non HMO)

## 2011-08-17 DIAGNOSIS — IMO0001 Reserved for inherently not codable concepts without codable children: Secondary | ICD-10-CM

## 2011-08-17 DIAGNOSIS — E1165 Type 2 diabetes mellitus with hyperglycemia: Secondary | ICD-10-CM

## 2011-08-17 LAB — BASIC METABOLIC PANEL
BUN: 14 mg/dL (ref 6–23)
Chloride: 94 mEq/L — ABNORMAL LOW (ref 96–112)
GFR calc Af Amer: >90 mL/min (ref >90)
GFR calc non Af Amer: >90 mL/min (ref >90)
Potassium: 3.9 mEq/L (ref 3.5–5.1)
Sodium: 131 mEq/L — ABNORMAL LOW (ref 135–145)

## 2011-08-17 LAB — GLUCOSE, CAPILLARY
Glucose-Capillary: 103 mg/dL — ABNORMAL HIGH (ref 70–99)
Glucose-Capillary: 129 mg/dL — ABNORMAL HIGH (ref 70–99)
Glucose-Capillary: 151 mg/dL — ABNORMAL HIGH (ref 70–99)
Glucose-Capillary: 151 mg/dL — ABNORMAL HIGH (ref 70–99)
Glucose-Capillary: 179 mg/dL — ABNORMAL HIGH (ref 70–99)

## 2011-08-17 LAB — CULTURE, BLOOD (ROUTINE X 2): Culture: NO GROWTH

## 2011-08-17 LAB — CBC
HCT: 30.5 % — ABNORMAL LOW (ref 36.0–46.0)
MCHC: 34.8 g/dL (ref 30.0–36.0)
Platelets: 239 10*3/uL (ref 150–400)
RDW: 12.4 % (ref 11.5–15.5)
WBC: 11.1 10*3/uL — ABNORMAL HIGH (ref 4.0–10.5)

## 2011-08-18 ENCOUNTER — Inpatient Hospital Stay (HOSPITAL_COMMUNITY): Payer: Managed Care, Other (non HMO)

## 2011-08-18 LAB — BASIC METABOLIC PANEL
BUN: 13 mg/dL (ref 6–23)
CO2: 27 mEq/L (ref 19–32)
Calcium: 8.6 mg/dL (ref 8.4–10.5)
Chloride: 95 mEq/L — ABNORMAL LOW (ref 96–112)
Creatinine, Ser: 0.56 mg/dL (ref 0.50–1.10)
GFR calc Af Amer: >90 mL/min (ref >90)
GFR calc non Af Amer: >90 mL/min (ref >90)
Glucose, Bld: 151 mg/dL — ABNORMAL HIGH (ref 70–99)
Potassium: 3.6 mEq/L (ref 3.5–5.1)
Sodium: 133 mEq/L — ABNORMAL LOW (ref 135–145)

## 2011-08-18 LAB — GLUCOSE, CAPILLARY
Glucose-Capillary: 148 mg/dL — ABNORMAL HIGH (ref 70–99)
Glucose-Capillary: 155 mg/dL — ABNORMAL HIGH (ref 70–99)

## 2011-08-19 DIAGNOSIS — I472 Ventricular tachycardia: Secondary | ICD-10-CM

## 2011-08-19 LAB — BASIC METABOLIC PANEL
CO2: 30 mEq/L (ref 19–32)
Calcium: 9 mg/dL (ref 8.4–10.5)
GFR calc non Af Amer: >90 mL/min (ref >90)
Potassium: 3.5 mEq/L (ref 3.5–5.1)
Sodium: 132 mEq/L — ABNORMAL LOW (ref 135–145)

## 2011-08-19 LAB — GLUCOSE, CAPILLARY
Glucose-Capillary: 119 mg/dL — ABNORMAL HIGH (ref 70–99)
Glucose-Capillary: 109 mg/dL — ABNORMAL HIGH (ref 70–99)
Glucose-Capillary: 107 mg/dL — ABNORMAL HIGH (ref 70–99)

## 2011-08-20 DIAGNOSIS — I517 Cardiomegaly: Secondary | ICD-10-CM

## 2011-08-20 LAB — GLUCOSE, CAPILLARY
Glucose-Capillary: 54 mg/dL — ABNORMAL LOW (ref 70–99)
Glucose-Capillary: 125 mg/dL — ABNORMAL HIGH (ref 70–99)

## 2011-08-21 ENCOUNTER — Inpatient Hospital Stay (HOSPITAL_COMMUNITY): Payer: Managed Care, Other (non HMO)

## 2011-08-21 DIAGNOSIS — I219 Acute myocardial infarction, unspecified: Secondary | ICD-10-CM

## 2011-08-21 LAB — BASIC METABOLIC PANEL
BUN: 15 mg/dL (ref 6–23)
Calcium: 9.3 mg/dL (ref 8.4–10.5)
Creatinine, Ser: 0.87 mg/dL (ref 0.50–1.10)
GFR calc non Af Amer: 71 mL/min — ABNORMAL LOW (ref >90)
Glucose, Bld: 144 mg/dL — ABNORMAL HIGH (ref 70–99)

## 2011-08-21 LAB — HEPARIN LEVEL (UNFRACTIONATED): Heparin Unfractionated: 0.14 IU/mL — ABNORMAL LOW (ref 0.30–0.70)

## 2011-08-21 LAB — GLUCOSE, CAPILLARY
Glucose-Capillary: 91 mg/dL (ref 70–99)
Glucose-Capillary: 63 mg/dL — ABNORMAL LOW (ref 70–99)
Glucose-Capillary: 157 mg/dL — ABNORMAL HIGH (ref 70–99)

## 2011-08-21 LAB — CBC
MCH: 29.7 pg (ref 26.0–34.0)
MCHC: 33.1 g/dL (ref 30.0–36.0)
MCV: 89.6 fL (ref 78.0–100.0)
Platelets: 505 10*3/uL — ABNORMAL HIGH (ref 150–400)
RDW: 13.2 % (ref 11.5–15.5)
WBC: 15.9 10*3/uL — ABNORMAL HIGH (ref 4.0–10.5)

## 2011-08-21 NOTE — Discharge Summary (Signed)
NAMERAIMI, GUILLERMO NO.:  [id number]  MEDICAL RECORD NO.:  [id number]  LOCATION:  2017                         FACILITY:  [facility]  PHYSICIAN:  [name], M.D. DATE OF BIRTH:  05-Jul-1951  DATE OF ADMISSION:  08/09/2011 DATE OF DISCHARGE:                              DISCHARGE SUMMARY   ADMITTING DIAGNOSES: 1. Non-ST elevation myocardial infarction. 2. 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 3. Newly diagnosed diabetes mellitus. 4. Congestive heart failure. 5. Newly diagnosed hyperlipidemia. 6. History of remote tobacco abuse.  DISCHARGE DIAGNOSES: 1. Non-ST elevation myocardial infarction. 2. 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 3. Newly diagnosed diabetes mellitus. 4. Congestive heart failure. 5. Newly diagnosed hyperlipidemia. 6. History of remote tobacco abuse. 7. Acute blood loss anemia.  PROCEDURES: 1. Cardiac catheterization performed by Dr. Allyson Sabal on August 10, 2011. 2. Median sternotomy for CABG x4 (LIMA to LAD, SVG to diagonal, SVG to     obtuse marginal branch of the left circumflex artery, SVG to the     posterior descending coronary artery, BVH from bilateral thighs by     Dr. [name] on August 13, 2011.  HISTORY OF PRESENTING ILLNESS:  This is a 60 year old African American female who has not seen a physician in many years.  She denied any previous history of cardiac disease, diabetes, hypertension, or hyperlipidemia.  According to medical records, the patient was in her usual state of health until approximately 3 days prior to admission when she developed severe substernal chest pain and epigastric pain that she initially attributed to indigestion.  Associated with this was nausea, vomiting, and diarrhea.  The pain persisted for the next 48 hours without any relief and then became associated with shortness of breath as well.  She presented to [facility] Emergency Room for  further evaluation and treatment.  Baseline electrocardiograms revealed sinus rhythm with nonspecific ST and T changes.  Cardiac enzymes were markedly elevated with troponin levels greater than 25.  The patient ruled in for a non-STEMI.  She then continued to have intermittent chest pain and was taken to the cath lab and underwent a cardiac catheterization by Dr. Allyson Sabal on August 10, 2011.  The patient was found to have severe 3- vessel coronary artery disease with a subtotal occlusion of the left circumflex coronary artery (likely the culprit vessel).  The patient underwent primary balloon angioplasty of left circumflex coronary artery with good results.  The patient was found to severe left ventricular dysfunction (approximately 20%).  An intra-aortic balloon pump was placed.  A cardiothoracic consultation was then obtained with Dr. [name] for the consideration of coronary bypass grafting surgery.  Potential risks, complications, and benefits of the surgery were discussed with the patient and she did agree to proceed.  Prior to undergoing surgery, however, the patient did undergo duplex carotid ultrasound which showed possible 40-59% of internal carotid artery stenosis in the left but no significant right internal carotid artery stenosis.  ABIs were 1.16 on the right and 0.86 on the left.  The patient then underwent CABG x4 on August 13, 2011.  BRIEF HOSPITAL COURSE STAY:  The patient was extubated early the evening  of surgery without difficulty.  She remained afebrile and hemodynamically stable.  Initially she was on milrinone, dopamine, and Neo-Synephrine drips.  Her IVP was removed on postoperative day #1.  Her drips were gradually weaned as tolerated and she was started on low-dose Coreg.  Swan-Ganz, A-line, chest tubes, and Foley were all removed early in her postoperative course.  The patient was found to have acute blood loss anemia.  Her H and H went as low as 9.7 and 28.  She  did not require postoperative transfusion.  She was started on Nu-Iron.  As previously stated, the patient had newly diagnosed diabetes mellitus. Her preop hemoglobin A1c was 10.2.  She was initially maintained on an insulin drip.  She was able to be weaned off this after being given Lantus insulin b.i.d. as well as meal coverage with NovoLog.  As she continued to tolerate her diet better, she was started on glipizide 10 mg p.o. 2 times daily and the scheduled Lantus and NovoLog insulin were stopped.  She will require close management and followup upon discharge. The patient was felt surgically stable for discharge from the Intensive Care Unit to [facility]U for further convalescence on August 15, 2011.  The patient was very volume overloaded and diuresed accordingly.  She continued to progress with cardiac rehab, currently on postop day #5. Her complaints include not sleeping well and cough with sputum.  She will be given Ambien p.r.n. for sleep as well as Mucinex 2 times daily. She had T-max 99.3 but later became afebrile.  Her heart rate has been in the 70s-80s, BP is 140/90, O2 sat 96% on room air.  Her preop weight is 113 kg.  Today's weight is down to 116.7 kg.  CBGs are 183, 175, 103 respectively.  PHYSICAL EXAMINATION:  CARDIOVASCULAR:  Regular rate and rhythm. PULMONARY:  Slight decreased at bases. ABDOMEN:  Soft, nontender.  Bowel sounds present. EXTREMITIES:  2+ lower extremity edema.  Her sternal and bilateral thigh wounds are clean, dry, and continuing to heal.  On tele she is maintaining sinus rhythm and PVCs.  Coreg was increased to 12.5  mg p.o. 2 times daily and she was also started on lisinopril 10 mg p.o. daily as well.  It should be noted that the patient then had several beats of WCT (? aberrated SVT).  Her potassium was found to be low and  supplmenented accordingly.  Dr. Myrtis Ser ordered an echocardiogram, which was  finally done on Tuesday morning (results are currently  pending).  In addition, EPS was consulted to see whether or not an ICD needed to be placed.  It was ultimately decided that she would be re-evaluated in 3 months and if her EF is less than 35%, she would require an ICD.  Dr. Johney Frame also discussed the use of a Lifevest, but the patient dif not want this.  She has had no further WCT on tele.  She has already been tolerating a diabetic diet; however, had not had a bowel movement until she was given lactulose. Epicardial pacing wires have already been removed and her chest tube sutures will be removed in the morning prior to discharge.  Latest laboratory studies are as follows:  BMET done on October 8, potassium 3.5 which has been supplemented.  Sodium 132, BUN and creatinine 10 and 0.6 respectively.  Blood culture showed no growth. CBC last done on October 6, H and H 10.6 and 30.5.  White count of 11,100, platelet count 239,000.  Last chest x-ray  done on August 18, 2011 shows cardiac silhouette with stable enlargement and central vascular congestion, left lower lobe consolidation with small left pleural effusion, no pneumothorax, right lung clear.  DISCHARGE INSTRUCTIONS: 1. Diet:  Low-sodium heart-healthy, diabetic diet. 2. Activity:  The patient may walk up steps.  She may shower.  She is     not to lift more than 10 pounds for 4 weeks.  She is not to drive     until after 4 weeks.  She is to continue with breathing exercise     daily.  She is to walk daily and increase her frequency of duration     as tolerates. 3. Wound care:  She is to soap and water on her wound.  She is to     contact the office if any wound problems arise.  FOLLOWUP APPOINTMENTS: 1. The patient is to contact her medical doctor for further management     of her diabetes. 2. The patient is to contact Dr. Myrtis Ser office for followup appointment     in 2 weeks. 3. The patient is going to have an appointment made to see Dr. [name]     within 3 weeks and 45 minutes prior  to this office appointment, a     chest x-ray will be obtained.  DISCHARGE MEDICATIONS:  1. Enteric-coated aspirin 325 mg p.o. daily. 2. Coreg 12.5 mg p.o. 2 times daily. 3. Lasix 40 mg p.o. two times daily. 4. Potassium chloride 20 mEq p.o. two times daily. 5. Glipizide 10 mg p.o. 2 times daily. 6. Oxycodone 5 mg IR 1-2 tabs p.o. q.4-6 hours p.r.n. pain. 7. Crestor 20 mg p.o. at bedtime. 8. Mucinex 600 mg p.o. 2 times daily p.r.n. cough. 9. Nu-Iron 150 mg p.o. daily. 10.Lisinopril 10 mg p.o. daily.     [name], PA   ______________________________ Salvatore Decent [name], M.D.    DZ/MEDQ  D:  08/18/2011  T:  08/18/2011  Job:  [id number]  cc:   [name], M.D. Luis Abed, MD, [facility]  Electronically Signed by [name] PA on 08/20/2011 03:40:15 PM Electronically Signed by Tressie Stalker M.D. on 08/21/2011 08:59:42 AM

## 2011-08-22 ENCOUNTER — Inpatient Hospital Stay (HOSPITAL_COMMUNITY): Payer: Managed Care, Other (non HMO)

## 2011-08-22 LAB — GLUCOSE, CAPILLARY: Glucose-Capillary: 130 mg/dL — ABNORMAL HIGH (ref 70–99)

## 2011-08-22 LAB — CBC
Hemoglobin: 10 g/dL — ABNORMAL LOW (ref 12.0–15.0)
MCH: 30.1 pg (ref 26.0–34.0)
MCV: 89.2 fL (ref 78.0–100.0)
RBC: 3.32 MIL/uL — ABNORMAL LOW (ref 3.87–5.11)

## 2011-08-23 LAB — GLUCOSE, CAPILLARY
Glucose-Capillary: 182 mg/dL — ABNORMAL HIGH (ref 70–99)
Glucose-Capillary: 228 mg/dL — ABNORMAL HIGH (ref 70–99)
Glucose-Capillary: 130 mg/dL — ABNORMAL HIGH (ref 70–99)

## 2011-08-23 LAB — CBC
HCT: 28.7 % — ABNORMAL LOW (ref 36.0–46.0)
Hemoglobin: 9.7 g/dL — ABNORMAL LOW (ref 12.0–15.0)
MCH: 30.3 pg (ref 26.0–34.0)
MCHC: 33.8 g/dL (ref 30.0–36.0)
MCV: 89.7 fL (ref 78.0–100.0)
Platelets: 544 K/uL — ABNORMAL HIGH (ref 150–400)
RBC: 3.2 MIL/uL — ABNORMAL LOW (ref 3.87–5.11)
RDW: 13.1 % (ref 11.5–15.5)
WBC: 11.3 K/uL — ABNORMAL HIGH (ref 4.0–10.5)

## 2011-08-24 DIAGNOSIS — I5023 Acute on chronic systolic (congestive) heart failure: Secondary | ICD-10-CM

## 2011-08-24 LAB — CULTURE, RESPIRATORY W GRAM STAIN: Culture: NORMAL

## 2011-08-24 LAB — CBC
HCT: 28.4 % — ABNORMAL LOW (ref 36.0–46.0)
MCHC: 33.8 g/dL (ref 30.0–36.0)
Platelets: 554 10*3/uL — ABNORMAL HIGH (ref 150–400)
RDW: 13.1 % (ref 11.5–15.5)
WBC: 10.5 10*3/uL (ref 4.0–10.5)

## 2011-08-24 LAB — GLUCOSE, CAPILLARY: Glucose-Capillary: 137 mg/dL — ABNORMAL HIGH (ref 70–99)

## 2011-08-24 LAB — URINE CULTURE

## 2011-08-25 LAB — CBC
MCH: 30.8 pg (ref 26.0–34.0)
MCV: 89.4 fL (ref 78.0–100.0)
Platelets: 569 10*3/uL — ABNORMAL HIGH (ref 150–400)
RDW: 13.2 % (ref 11.5–15.5)
WBC: 11.6 10*3/uL — ABNORMAL HIGH (ref 4.0–10.5)

## 2011-08-25 LAB — GLUCOSE, CAPILLARY
Glucose-Capillary: 142 mg/dL — ABNORMAL HIGH (ref 70–99)
Glucose-Capillary: 114 mg/dL — ABNORMAL HIGH (ref 70–99)
Glucose-Capillary: 126 mg/dL — ABNORMAL HIGH (ref 70–99)

## 2011-08-25 NOTE — Discharge Summary (Signed)
Christine Knight, WHISNANT NO.:  [id number]  MEDICAL RECORD NO.:  [id number]  LOCATION:  2017                         FACILITY:  [facility]  PHYSICIAN:  Salvatore Decent. Christine Knight, M.D. DATE OF BIRTH:  1950/12/01  DATE OF ADMISSION:  08/09/2011 DATE OF DISCHARGE:  08/23/2011                              DISCHARGE SUMMARY   ADDENDUM  BRIEF HOSPITAL COURSE STAY SINCE LAST DICTATION:  The patient was going to be discharged on August 20, 2011; however, 2D echocardiogram that had been ordered earlier in the day, apparently raised a question of possible left ventricular mural thrombus at the apex.  It should be noted the intraoperative TEE that was done on August 13, 2011, did not show any signs of this.  Dr. Cornelius Knight thoroughly reviewed both echocardiograms with Dr. Shirlee Latch and felt there was unlikely a mural thrombus present. Pharmacy did start a heparin drip as well as gave 1 dose of Coumadin.  The patient then developed hypotension and hypoglycemia.  Systolic blood pressures remained in the 60s to 80s on August 20, 2011.  As a result, her Coreg dose was decreased to 6.25 mg p.o. 2 times daily, Lasix and potassium were held, she was given normal saline 100 mL x4 hours, and her vital signs were closely monitored.  She continued with hypotension and was then given 250 mL bolus.  On August 21, 2011, the patient was found to be afebrile, heart rate 70s to 80s, BP 96/58, and O2 sat 99%, and at that time, she was on 2 L nasal cannula.  Her complaints included not sleeping well  and as a result had a headache.  She also had complaints of feeling "hot and cold"and increasing cough with sputum; however, she did deny abdominal pain, shortness of breath, or chest pain.        Laboratory studies done10/08/2011 found that the potassium was low at 3.3 and this was supplemented accordingly.  BUN and creatinine was 15 and 0.87 respectively.  H and H was 10.3 and 31.1 and the white count was now up  to 15,900.  Chest x-ray essentially remained stable (no pneumothorax, right lung clear, cardiomegaly, atelectasis, and persistent consolidation of the left base).  There was no change on her physical exam.  Her lisinopril, Coreg, as well as diuretics were held on this day secondary to continued hypotension.  We did obtain a sputum culture, which results are currently pending.  It was felt that with the increase in white blood cell count from 11,100 to 15,900 and increased congestion as well as hypotension that she may be developing early sepsis possible secondary to pneumonia.  As a result, she was placed on Zosyn 3.375 mg IV q.6 h.  Regarding the patient's hot and cold symptoms this was probably related to her hypoglycemia. She later did note that she had some blurriness of her vision and also had some sweating; again, related to hypoglycemia.  Another echocardiogram with contrast was also obtained on August 21, 2011. There was no evidence of thrombus seen.  As a result, the patient's heparin was stopped and she was not given any more Coumadin.      Currently on August 22, 2011, the patient has  no complaints, she is feeling much better.  She did have a T-max of 100.3 overnight and was afebrile for last 12 hours.  Her heart rate is in the 80s, BP 103/78, O2 sat is 98% on room air.  Her preop weight is 113 kg, today's weight is 115.5 kg. CBG is 103, 82, and 127 respectively.  CBC done today showed a white count down to 13,000, H and H 10 and 29.6 respectively.  Gram smear from the sputum shows few white blood cells, few gram-positive cocci, rare yeast, and rare gram-positive rods.  Provided the patient remains afebrile, white blood cell count continues to decrease, she remains hemodynamically stable, and pending morning round evaluation, she will be surgically stable for discharge on August 23, 2011.  We will discontinue her Zosyn and start her on Augmentin 875 mg p.o. 2 times daily for  1 week.  In addition, because of the several episodes of hypoglycemia, I will decrease her glipizide to 5 mg p.o. 2 times daily and again, she will need to follow up with her medical doctor regarding further diabetes management.  As previously stated, her Coreg has already been decreased to 6.25 mg p.o. 2 times daily and she will be continued on this as well as lisinopril 10 mg p.o. daily.     [name], PA   ______________________________ Salvatore Decent Christine Knight, M.D.    DZ/MEDQ  D:  08/22/2011  T:  08/22/2011  Job:  [id number]  cc:   Luis Abed, MD, [facility]  Electronically Signed by [name] PA on 08/22/2011 12:26:59 PM Electronically Signed by Tressie Stalker M.D. on 08/25/2011 07:19:52 AM

## 2011-08-26 LAB — BASIC METABOLIC PANEL
BUN: 10 mg/dL (ref 6–23)
CO2: 28 mEq/L (ref 19–32)
Chloride: 96 mEq/L (ref 96–112)
Glucose, Bld: 89 mg/dL (ref 70–99)
Potassium: 3.8 mEq/L (ref 3.5–5.1)
Sodium: 135 mEq/L (ref 135–145)

## 2011-08-26 LAB — CBC
HCT: 26.9 % — ABNORMAL LOW (ref 36.0–46.0)
Hemoglobin: 9.1 g/dL — ABNORMAL LOW (ref 12.0–15.0)
MCV: 89.7 fL (ref 78.0–100.0)
WBC: 10.4 10*3/uL (ref 4.0–10.5)

## 2011-08-27 NOTE — Discharge Summary (Signed)
Christine Knight, Christine Knight NO.:  [id number]  MEDICAL RECORD NO.:  [id number]  LOCATION:  2017                         FACILITY:  [facility]  PHYSICIAN:  [name], M.D. DATE OF BIRTH:  08-01-1951  DATE OF ADMISSION:  08/09/2011 DATE OF DISCHARGE:  08/26/2011                              DISCHARGE SUMMARY   ADDENDUM:  BRIEF HOSPITAL COURSE STAY: Since last dictation, the patient did have a T-max of a 100, but had been afebrile the last 12 hours and her vital signs  remained stable.  On tele, she did have a brief run of the wide-complex tachycardia (as she had previously) questionable junctional, but then returned to sinus rhythm.  The patient initially was feeling okay in the morning, however, as the day progressed she did not feel all that well. As a result, her discharge was held.  Over the weekend she continued to progress with cardiac rehab.  She did continue to have hypotension (systolic blood pressure in the 70s to 80s) and was symptomatic.  As a result, the Coreg was then decreased at 3.125 mg p.o. daily and lisinopril was held.  Also as far as her diuresis, when her systolic blood pressure was in the 90s or higher, she was given Lasix 40 mg p.o. daily with potassium supplement.  The patient was also found to have a urinary tract infection (greater than 100,000 Klebsiella pneumoniae and Proteus mirabilis).  As a result, her Augmentin was discontinued and she was placed on Cipro 500 mg p.o. 2 times daily.  Currently, the patient had a T-max of 99.1 but it then become afebrile, heart rate is in the 80s, BP 113/79, O2 sat 100% on room air.  Preop weight is 113 kg, today's weight down to 108.9 kg. CBG is 86, 126, and 118.  PHYSICAL EXAMINATION:  CARDIOVASCULAR:  Regular rate and rhythm. PULMONARY:  Clear. ABDOMEN:  Soft and nontender.  Bowel sounds present. EXTREMITY:  Decreased lower extremity edema.  Sternal right and left lower extremity wounds are  clean, dry, and continuing to heal.  She has been seen and evaluated by both Cardiology and Dr. Barry Dienes and is felt surgically stable for discharge today.  LATEST LABORATORY STUDIES:  CBC done today H and H 9.1/26.9, white count 10,400, platelet count of 132,000.  BMET on this day, potassium 3.8 which has been supplemented.  BUN and creatinine 10 and 0.57 respectively.  Sodium 135.  CHANGES TO HER DISCHARGE INSTRUCTIONS:  The patient is going to be seen in followup by Dr. Barry Dienes on September 02, 2011.  45 minutes prior to this office appointment chest x-ray does need to be obtained. Please also add under follow up appointments, the patient is going to be seen and evaluated by EPS in approximately 3 months to determine whether or not her EF is low and whether or not an ICD needs to be placed. In addition, Cardiology has placed her on a limitation of her sodium to 2 g or less per day.    DISCHARGE MEDICATIONS:  At time of dictation include the following; 1. Crestor 20 mg p.o. h.s. 2. Enteric-coated aspirin 325 mg p.o. daily. 3. Mucinex 600 mg p.o. 2 times daily for  cough. 4. Glipizide 5 mg p.o. 2 times daily. 5. Coreg 3.125 mg p.o. 2 times daily. 6. Cipro 500 mg p.o. 2 times daily for 5 days. 7. Lasix 40 mg p.o. daily. 8. Potassium chloride 20 mEq p.o. daily. 9. Oxycodone 5 mg 1-2 tablets p.o. q.4-6 hours p.r.n. pain.  Please note the patient was not discharged home on an ACE inhibitor secondary to symptomatic hypotension.  We will begin this as an outpatient as her blood pressure tolerates.     [name], PA   ______________________________ Salvatore Decent [name], M.D.    DZ/MEDQ  D:  08/26/2011  T:  08/27/2011  Job:  [id number]  cc:   Luis Abed, MD, [facility]  Electronically Signed by [name] PA on 08/27/2011 04:54:09 AM Electronically Signed by Tressie Stalker M.D. on 08/27/2011 05:39:01 PM

## 2011-08-28 ENCOUNTER — Other Ambulatory Visit: Payer: Self-pay | Admitting: Thoracic Surgery (Cardiothoracic Vascular Surgery)

## 2011-08-28 DIAGNOSIS — I2510 Atherosclerotic heart disease of native coronary artery without angina pectoris: Secondary | ICD-10-CM

## 2011-08-30 ENCOUNTER — Encounter: Payer: Self-pay | Admitting: *Deleted

## 2011-08-30 DIAGNOSIS — E1140 Type 2 diabetes mellitus with diabetic neuropathy, unspecified: Secondary | ICD-10-CM | POA: Insufficient documentation

## 2011-08-30 DIAGNOSIS — Z87891 Personal history of nicotine dependence: Secondary | ICD-10-CM | POA: Insufficient documentation

## 2011-08-30 DIAGNOSIS — Z951 Presence of aortocoronary bypass graft: Secondary | ICD-10-CM | POA: Insufficient documentation

## 2011-09-02 ENCOUNTER — Encounter: Payer: Self-pay | Admitting: Thoracic Surgery (Cardiothoracic Vascular Surgery)

## 2011-09-02 ENCOUNTER — Ambulatory Visit
Admission: RE | Admit: 2011-09-02 | Discharge: 2011-09-02 | Disposition: A | Discharge status: Home / Self Care | Payer: Managed Care, Other (non HMO) | Source: Ambulatory Visit | Attending: Thoracic Surgery (Cardiothoracic Vascular Surgery) | Admitting: Thoracic Surgery (Cardiothoracic Vascular Surgery)

## 2011-09-02 ENCOUNTER — Ambulatory Visit (INDEPENDENT_AMBULATORY_CARE_PROVIDER_SITE_OTHER): Payer: Self-pay | Admitting: Thoracic Surgery (Cardiothoracic Vascular Surgery)

## 2011-09-02 ENCOUNTER — Other Ambulatory Visit: Payer: Self-pay | Admitting: Thoracic Surgery (Cardiothoracic Vascular Surgery)

## 2011-09-02 ENCOUNTER — Other Ambulatory Visit: Payer: Self-pay

## 2011-09-02 VITALS — BP 118/85 | HR 100 | Resp 20 | Ht 67.0 in | Wt 250.0 lb

## 2011-09-02 DIAGNOSIS — I2510 Atherosclerotic heart disease of native coronary artery without angina pectoris: Secondary | ICD-10-CM

## 2011-09-02 DIAGNOSIS — J90 Pleural effusion, not elsewhere classified: Secondary | ICD-10-CM | POA: Insufficient documentation

## 2011-09-02 DIAGNOSIS — Z951 Presence of aortocoronary bypass graft: Secondary | ICD-10-CM | POA: Insufficient documentation

## 2011-09-02 NOTE — Progress Notes (Signed)
HPI: Patient returns for routine postoperative follow-up having undergone high risk coronary artery bypass grafting x4 on 08/13/2011. The patient's early postoperative recovery while in the hospital was remarkably uncomplicated. Since hospital discharge the patient reports progressing fairly well. The patient reports some exertional shortness of breath. She has some mild residual soreness in her chest, but this does not seem to limit her much. She is walking every day. Her appetite is fair. She reports to have reasonable blood sugar control. Overall she has no complaints.   Current Outpatient Prescriptions  Medication Sig Dispense Refill  . aspirin 325 MG EC tablet Take 325 mg by mouth daily.        . carvedilol (COREG) 6.25 MG tablet Take 6.25 mg by mouth 2 (two) times daily with a meal.        . glipiZIDE (GLUCOTROL) 10 MG tablet Take 10 mg by mouth 2 (two) times daily before a meal.        . guaiFENesin (MUCINEX) 600 MG 12 hr tablet Take 1,200 mg by mouth 2 (two) times daily.        Marland Kitchen lisinopril (PRINIVIL,ZESTRIL) 10 MG tablet Take 10 mg by mouth daily.        Marland Kitchen oxycodone (OXY-IR) 5 MG capsule Take 5 mg by mouth every 4 (four) hours as needed. One or two q4-6h prn       . polysaccharide iron (NIFEREX) 150 MG CAPS capsule Take 150 mg by mouth daily.        . potassium chloride SA (K-DUR,KLOR-CON) 20 MEQ tablet daily.       . rosuvastatin (CRESTOR) 20 MG tablet Take 20 mg by mouth daily.          Physical Exam: On physical exam the patient has regular rate and rhythm with stable blood pressure. Her median sternotomy incision is healing nicely although there is an area near the top where a dry eschar has apparently fallen off leaving some underlying granulation tissue. The incision remains intact. The sternum is stable on palpation. Chest tube incisions are slowly healing but clean. Auscultation of the chest reveals diminished breath sounds on the left side. Breath sounds on the right are clear.  Cardiovascular exam is notable for regular rate and rhythm. The abdomen is soft and nontender. Extremities warm and well-perfused. There is mild to moderate bilateral lower extremity edema, more so on the right than the left. This has actually decreased in comparison with the amount of edema she had at the time of hospital discharge. The saphenous vein harvest incisions are healing well so far.  Diagnostic Tests: Chest x-ray performed today demonstrates a large left pleural effusion with near complete opacification of the left chest. All of the sternal wires appear intact and no other abnormalities are noted.  Impression: The patient has developed a large left pleural effusion since hospital discharge. Clinically she is getting along fairly well, although she does have some exertional shortness of breath. She otherwise seems to be doing fairly well.  Procedure Note: Following full informed consent the patient undergoes left needle thoracentesis here in the office today. With the patient sitting upright on the exam table, the left posterior chest was prepared and draped in a sterile manner. 1% lidocaine solution is utilized to anesthetize the skin and subcutaneous tissues. Needle thoracentesis is performed in the mid scapular line overlying approximately the eighth or ninth intercostal space. A total of 1.4 L of thin serous slightly blood-tinged fluid is easily evacuated. The patient tolerated the  procedure well.  Plan: We will obtain followup chest x-ray today. We will also have the patient return in 2 weeks with another followup chest x-ray at that time. We have not made any changes in her current medications. All of her questions been addressed.

## 2011-09-07 LAB — BODY FLUID CULTURE: Gram Stain: NONE SEEN

## 2011-09-09 ENCOUNTER — Ambulatory Visit: Payer: Managed Care, Other (non HMO) | Admitting: Thoracic Surgery (Cardiothoracic Vascular Surgery)

## 2011-09-10 ENCOUNTER — Encounter: Payer: Self-pay | Admitting: Thoracic Surgery (Cardiothoracic Vascular Surgery)

## 2011-09-11 ENCOUNTER — Other Ambulatory Visit: Payer: Self-pay | Admitting: Thoracic Surgery (Cardiothoracic Vascular Surgery)

## 2011-09-11 DIAGNOSIS — I2510 Atherosclerotic heart disease of native coronary artery without angina pectoris: Secondary | ICD-10-CM

## 2011-09-13 NOTE — Consult Note (Signed)
NAMEGRAYCEE, GREESON            ACCOUNT NO.:  [id number]  MEDICAL RECORD NO.:  [id number]  LOCATION:  2903                         FACILITY:  [facility]  PHYSICIAN:  Richarda Overlie, MD       DATE OF BIRTH:  Nov 17, 1950  DATE OF CONSULTATION:  08/10/2011 DATE OF DISCHARGE:                                CONSULTATION   REASON FOR CONSULTATION:  Consultation for management of hyperglycemia and uncontrolled diabetes.  REQUESTING PROVIDER:  Luis Abed, MD, [facility]  SUBJECTIVE:  A 60 year old female with no previous cardiac history and no history of diabetes, who presents to the ER with chest pain.  The patient also had some associated nausea, vomiting and diarrhea and had chest pressure 10/10 in intensity.  She has been unaware of any history of diabetes and has not seen a physician since 2005.  She was admitted only once in the last 8 years for a gallstone operation for cholecystectomy.  The patient states that she has otherwise been feeling well.  She has not had any orthopnea, paroxysmal nocturnal dyspnea, dependent edema.  She denies any fever, chills, rigors.  She was found to have an inferior Q-wave MI during this admission and has been managed by the cardiology service.  Consultation is requested to manage her diabetes.  PAST MEDICAL HISTORY:  History of cholecystectomy at [facility].  ALLERGIES:  No known drug allergies.  HOME MEDICATIONS:  Mucinex.  SOCIAL HISTORY:  Lives in [place] and her son lives with her.  She works as a Corporate investment banker.  She quit smoking more than 30 years ago and denies alcohol and drug use.  FAMILY HISTORY:  Her mother is 70 when she died with no cardiac issues. Father died in his 9s and had heart issues.  She has one brother who has diabetes.  REVIEW OF SYSTEMS:  Complete review of systems was done as documented in the HPI.  PHYSICAL EXAMINATION:  VITAL SIGNS: Blood pressure 113/71, heart rate of 88, temperature 97.8. GENERAL:  Currently comfortable, in no acute cardiopulmonary distress. HEENT: Pupils equal and reactive.  Extraocular movements are intact. NECK: Supple.  No JVD. LUNGS: Clear to auscultation bilaterally.  No wheezes or crackles or rhonchi. CARDIOVASCULAR: Regular rate and rhythm.  No murmurs, rubs or gallops. ABDOMEN: Soft, nontender, nondistended. EXTREMITIES: Without cyanosis, clubbing or edema. NEUROLOGIC: Cranial nerves II-XII grossly intact.  LAB:  Hemoglobin A1c is 10.3.  Troponin peaked at greater than 25. Sodium 136, potassium 4.3, chloride 100, bicarb 27, glucose 343, BUN 18, creatinine 0.68, and calcium of 10.1.  Lipid panel shows total cholesterol of 344, triglyceride 67, LDL of 272.  Pro BNP of 1852.  TSH 0.486, MRSA PCR is negative.  Chest x-ray shows persistent cardiac enlargement and pulmonary vascular congestion with developing perihilar airspace disease suggestive of developing edema.  ASSESSMENT/PLAN: 1. New-onset type 2 diabetes, probably insulin dependent. 2. Morbid obesity. 3. Dyslipidemia with elevated LDL. 4. Inferior Q-wave myocardial infarction.  PLAN:  The patient will be started on Lantus and sliding scale insulin, has already been initiated.  We will check her sugars q.a.c. and at bedtime and inpatient diabetes education and nutrition consult will be provided.  The patient refuses  to be discharged home on insulin therapy. Therefore, the patient will be started on oral hypoglycemics most likely a sulfonylurea.  The patient appears to the motivated to lose weight and monitor her CBGs judiciously at home.  We will continue to follow this patient along with cardiology service.     Richarda Overlie, MD     NA/MEDQ  D:  08/10/2011  T:  08/10/2011  Job:  [id number]  Electronically Signed by Richarda Overlie MD on 09/13/2011 10:40:10 AM

## 2011-09-14 NOTE — Consult Note (Signed)
NAMESCHERRY, Christine Knight NO.:  [id number]  MEDICAL RECORD NO.:  [id number]  LOCATION:  [facility]                         FACILITY:  [facility]  PHYSICIAN:  Luis Abed, MD, FACCDATE OF BIRTH:  1951-05-12  DATE OF CONSULTATION:  08/09/2011 DATE OF DISCHARGE:                                CONSULTATION   PRIMARY CARE PHYSICIAN:  None.  PRIMARY CARDIOLOGIST:  None.  CHIEF COMPLAINT:  Chest pain.  HISTORY OF PRESENT ILLNESS:  Christine Knight is a 60 year old female with no previous history of cardiac issues.  At approximately midnight last night at rest, she had onset of lower abdominal pain she describes as cramping.  She had nausea and vomiting and diarrhea.  She describes the pain is radiating up her abdomen and into her chest.  It was a pressure. It was a 10/10.  She thought she was having a heart attack but did not seek help or take any medications.  The nausea, vomiting, and diarrhea continued all night.  She came to the emergency room today when her symptoms did not resolve.  She was trying to drink Sprite but has not had any other significant p.o. intake.  She is currently describing the chest pain as a pressure.  It does not change with palpation of her chest or deep inspiration but she states it is worse lying down.  She has never had it before.  It does not radiate.  There was no blood in the vomitus or in the diarrhea.  She denies orthopnea or PND.  Currently she looks acutely uncomfortable and is vomiting a bilious vomitus.  PAST MEDICAL HISTORY:  Her last checkup was 7 or 8 years ago.  She denies any history of diabetes, hypertension, hyperlipidemia.  SURGICAL HISTORY:  She had a procedure for cholelithiasis that she describes it as them going through her navel but not taking out her gallbladder.  This was more than 5 years ago at [facility].  ALLERGIES:  No known drug allergies.  CURRENT MEDICATIONS:  Mucinex.  SOCIAL HISTORY:  She lives in  [place] and her son lives with her. She works as a Research officer, political party.  She quit tobacco more than 30 years ago and denies alcohol or drug abuse.  FAMILY HISTORY:  Her mother is 72 when she died with no cardiac issues. Her father was in his 40s and may have had cardiac issues but she has no siblings.  REVIEW OF SYSTEMS:  She denies any recent fevers, chills, or sweats. She has had some upper respiratory nasal congestion and cough but no wheezing for which she has been taking the Mucinex p.r.n.Marland Kitchen  She feels weak now.  The nausea, vomiting, diarrhea is described above.  Full 14 point review of systems is otherwise negative except as stated in the HPI.  PHYSICAL EXAMINATION:  VITAL SIGNS:  Temperature is 97.6, blood pressure 123/82, pulse 79, respiratory rate 30, O2 saturation 96% on room air. GENERAL:  She is a well-developed obese [place] American female who appears to be in mild to moderate distress. HEENT:  Normal. NECK:  There is no lymphadenopathy, thyromegaly, or bruits noted.  JVD is felt to be minimal but not well seen because  of body habitus. CV:  Her heart is regular in rate and rhythm with an S1, S2 and no significant murmur, rub, or gallop is noted.  Distal pulses are intact in all 4 extremities. LUNGS:  She has bilateral rales and possibly few crackles in the bases. SKIN:  No rashes or lesions are noted. ABDOMEN:  Her lower abdomen is firm and tender with very few bowel sounds noted. EXTREMITIES:  There is no cyanosis, clubbing, or edema noted. MUSCULOSKELETAL:  There is no joint deformity or effusions and no spine or CVA tenderness. NEURO:  She is alert and oriented with cranial nerves II-XII grossly intact.  Chest x-ray shows cardiomegaly with pulmonary vascular congestion.  EKG:  Sinus rhythm with PVCs, rate 91 with borderline ST changes and no old is available for comparison.  LABORATORY VALUES:  Initial point-of-care troponin was negative. Hemoglobin 16,  hematocrit 44.7, WBCs 8.3, platelets 267,000.  Sodium 137, potassium 4.1, chloride 98, CO2 24, BUN 13, creatinine 0.54, glucose 443, alkaline phosphatase 133, SGOT 66, SGPT 40, albumin 3.9. BNP 471.1.  IMPRESSION:  Christine Knight was seen today by Dr. Myrtis Ser, the patient evaluated and the data reviewed.  It is very difficult to assess her chest pressure.  She had vomiting all night and diarrhea.  Her EKG is nonspecific.  Her initial point-of-care troponin was initially not available but now turns out to be negative.  We will be happy to follow this patient in the hospital.  We will cycle cardiac enzymes and check an echocardiogram.  We will be happy to consult on this patient and continue to follow her closely with you.     Theodore Demark, PA-C   ______________________________ Luis Abed, MD, [facility]    RB/MEDQ  D:  08/09/2011  T:  08/09/2011  Job:  [id number]  Electronically Signed by Theodore Demark PA-C on 09/13/2011 04:09:13 PM Electronically Signed by Willa Rough MD FACC on 09/14/2011 07:59:08 AM

## 2011-09-16 ENCOUNTER — Ambulatory Visit
Admission: RE | Admit: 2011-09-16 | Discharge: 2011-09-16 | Disposition: A | Discharge status: Home / Self Care | Payer: Managed Care, Other (non HMO) | Source: Ambulatory Visit | Attending: Thoracic Surgery (Cardiothoracic Vascular Surgery) | Admitting: Thoracic Surgery (Cardiothoracic Vascular Surgery)

## 2011-09-16 ENCOUNTER — Encounter: Payer: Self-pay | Admitting: Thoracic Surgery (Cardiothoracic Vascular Surgery)

## 2011-09-16 ENCOUNTER — Ambulatory Visit (INDEPENDENT_AMBULATORY_CARE_PROVIDER_SITE_OTHER): Payer: Self-pay | Admitting: Thoracic Surgery (Cardiothoracic Vascular Surgery)

## 2011-09-16 VITALS — BP 127/91 | HR 96 | Resp 20 | Ht 67.0 in | Wt 246.0 lb

## 2011-09-16 DIAGNOSIS — J90 Pleural effusion, not elsewhere classified: Secondary | ICD-10-CM | POA: Insufficient documentation

## 2011-09-16 DIAGNOSIS — I2510 Atherosclerotic heart disease of native coronary artery without angina pectoris: Secondary | ICD-10-CM

## 2011-09-16 DIAGNOSIS — Z951 Presence of aortocoronary bypass graft: Secondary | ICD-10-CM

## 2011-09-16 MED ORDER — FUROSEMIDE 40 MG PO TABS: 40.0000 mg | ORAL_TABLET | Freq: Two times a day (BID) | ORAL | Status: DC

## 2011-09-16 NOTE — Patient Instructions (Signed)
Start lasix and continue potassium.  Return if shortness of breath develops.

## 2011-09-16 NOTE — Progress Notes (Signed)
Patient returns for further followup status post coronary artery bypass grafting x4 on 08/13/2011. She was last seen here in the office 2 weeks ago at which time she underwent thoracentesis for left pleural effusion. Since then she has felt better. She returns to the office today reporting that she feels quite well. She does have some soreness in her chest and in particular she has sensitivity along the left anterior chest wall which is commonly seen for patient with left internal mammary artery harvested in grafting. Her appetite is good. She reports her blood sugars have been under good control. She denies any shortness of breath. The remainder of her review of systems unremarkable.  On physical exam the patient surgical incisions are all healing nicely. Breath sounds are clear to auscultation although there is slightly diminished breath sounds left lung base. The extremities warm and well-perfused. There is mild to moderate bilateral lower extremity edema.  Chest x-ray performed today demonstrates some partial reaccumulation of the left pleural effusion. This is not large, but nonetheless there has been some reaccumulation in comparison with her last previous film.  We will restart the patient on oral Lasix. We will see the patient back in 2 weeks to see she is doing and get another followup x-ray. All of her questions been addressed.

## 2011-09-17 ENCOUNTER — Telehealth: Payer: Self-pay | Admitting: Cardiology

## 2011-09-17 NOTE — Telephone Encounter (Signed)
N/A.  LMTC. 

## 2011-09-17 NOTE — Telephone Encounter (Signed)
Pt saw dr Myrtis Ser in hospital about 3 weeks ago, having numbness in hands that she thinks is coming from medication

## 2011-09-18 NOTE — Telephone Encounter (Signed)
Post hospital patient called want  to be seen by Dr. Myrtis Ser. On D/C note from [facility] hospital, patient needed to have  A 2 weeks f/U in  this office.Patient was d/C on October 16 th 2012. Patient also C/O of constant pain and numbness on her left hand.. An appointment was made with Jacolyn Reedy PA on 09/19/11 at 1:00 Pm, patient aware.

## 2011-09-18 NOTE — Telephone Encounter (Signed)
Pt returning your call

## 2011-09-19 ENCOUNTER — Encounter: Payer: Self-pay | Admitting: Physician Assistant

## 2011-09-19 ENCOUNTER — Ambulatory Visit (INDEPENDENT_AMBULATORY_CARE_PROVIDER_SITE_OTHER): Payer: Managed Care, Other (non HMO) | Admitting: Physician Assistant

## 2011-09-19 VITALS — BP 125/95 | HR 94 | Ht 67.0 in | Wt 246.4 lb

## 2011-09-19 DIAGNOSIS — Z951 Presence of aortocoronary bypass graft: Secondary | ICD-10-CM

## 2011-09-19 DIAGNOSIS — J90 Pleural effusion, not elsewhere classified: Secondary | ICD-10-CM

## 2011-09-19 DIAGNOSIS — E785 Hyperlipidemia, unspecified: Secondary | ICD-10-CM

## 2011-09-19 DIAGNOSIS — I519 Heart disease, unspecified: Secondary | ICD-10-CM

## 2011-09-19 DIAGNOSIS — I2510 Atherosclerotic heart disease of native coronary artery without angina pectoris: Secondary | ICD-10-CM

## 2011-09-19 DIAGNOSIS — M79609 Pain in unspecified limb: Secondary | ICD-10-CM

## 2011-09-19 DIAGNOSIS — I509 Heart failure, unspecified: Secondary | ICD-10-CM

## 2011-09-19 DIAGNOSIS — M79643 Pain in unspecified hand: Secondary | ICD-10-CM

## 2011-09-19 LAB — BASIC METABOLIC PANEL
BUN: 10 mg/dL (ref 6–23)
Calcium: 9.3 mg/dL (ref 8.4–10.5)
Chloride: 105 mEq/L (ref 96–112)
Creatinine, Ser: 0.7 mg/dL (ref 0.4–1.2)

## 2011-09-19 NOTE — Progress Notes (Signed)
HPI:  This is a 60 year old African American female patient who is here for a postop follow-up after undergoing CABG x4 August 13, 2011 with LIMA to the LAD, SVG to the diagonal, SVG to the OM, SVG to the PDA. This occurred after a non-ST elevation MI. She is severely dysfunction ejection fraction 20%. She had thoracentesis for left pleural effusion approximately 2 weeks ago.She saw Dr. [name] on 09/16/2011 and had re accumulation of a left pleural effusion and was restarted on Lasix.  In the office today she complains of significant pain in her bilateral hands and feet. She says she's had severe pain that is constant since discharge from the hospital. She says it feels like pins and needles and sometimes wakes her at night. She's never had pain like this before.  She denies cardiac complaints of chest pain, palpitations, dyspnea, dyspnea on exertion, dizziness or presyncope. She does have minor soreness in her chest. No Known Allergies  Current Outpatient Prescriptions on File Prior to Visit  Medication Sig Dispense Refill  . aspirin 325 MG EC tablet Take 325 mg by mouth daily.        . carvedilol (COREG) 6.25 MG tablet Take 3.125 mg by mouth 2 (two) times daily with a meal.       . furosemide (LASIX) 40 MG tablet Take 1 tablet (40 mg total) by mouth 2 (two) times daily.  60 tablet  11  . glipiZIDE (GLUCOTROL) 10 MG tablet Take 5 mg by mouth 2 (two) times daily before a meal.       . potassium chloride SA (K-DUR,KLOR-CON) 20 MEQ tablet daily.       . rosuvastatin (CRESTOR) 20 MG tablet Take 20 mg by mouth daily.          Past Medical History  Diagnosis Date  . Obesity, morbid   . CHF (congestive heart failure)   . History of tobacco abuse   . Myocardial infarction 08/10/11    non-STEMI  . Hyperlipidemia 08/10/11    newly diagnosed  . Diabetes mellitus type 2 in obese 08/10/11/    discovered this admission  . CAD (coronary artery disease) 08/30/2011  . Ischemic cardiomyopathy 08/13/2011    EF  20%    Past Surgical History  Procedure Date  . Cholecystectomy   . Coronary artery bypass graft 08/13/11    CABG x4 with LIMA to LAD, SVG to Diag, SVG to OM, SVG to PDA, EVH via both thighs    No family history on file.  History   Social History  . Marital Status: Divorced    Spouse Name: N/A    Number of Children: N/A  . Years of Education: N/A   Occupational History  . Not on file.   Social History Main Topics  . Smoking status: Former Smoker    Types: Cigarettes    Quit date: 11/11/1978  . Smokeless tobacco: Never Used  . Alcohol Use: No  . Drug Use: No  . Sexually Active: Not on file   Other Topics Concern  . Not on file   Social History Narrative  . No narrative on file    ROS: See HPI Eyes: Negative Ears:Negative for hearing loss, tinnitus Cardiovascular: Negative for chest pain, palpitations,irregular heartbeat, dyspnea, dyspnea on exertion, near-syncope, orthopnea, paroxysmal nocturnal dyspnia and syncope,edema, claudication, cyanosis,.  Respiratory:   Negative for cough, hemoptysis, shortness of breath, sleep disturbances due to breathing, sputum production and wheezing.   Endocrine: Negative for cold intolerance and heat intolerance.  Hematologic/Lymphatic: Negative for adenopathy and bleeding problem. Does not bruise/bleed easily.  Musculoskeletal: soreness in chest. Pain in hands and feet. See above.  Gastrointestinal: Negative for nausea, vomiting, reflux, abdominal pain, diarrhea, constipation.   Genitourinary: Negative for bladder incontinence, dysuria, flank pain, frequency, hematuria, hesitancy, nocturia and urgency.  Neurological: Negative.  Allergic/Immunologic: Negative for environmental allergies.   PHYSICAL EXAM: Well-nournished, in no acute distress. Neck: No JVD, HJR, Bruit, or thyroid enlargement Lungs: decreased breath sounds on the left lung halfway up,No tachypnea, clear without wheezing, rales, or rhonchion the  right Cardiovascular: RRR, PMI not displaced,positive S4, bruit, thrill, or heave. Abdomen: BS normal. Soft without organomegaly, masses, lesions or tenderness. Extremities: without cyanosis, clubbing or edema. Good distal pulses bilateral SKin: Warm, no lesions or rashes  Musculoskeletal: No deformities Neuro: no focal signs  BP 125/95  Pulse 94  Ht 5\' 7"  (1.702 m)  Wt 246 lb 6.4 oz (111.766 kg)  BMI 38.59 kg/m2

## 2011-09-19 NOTE — Assessment & Plan Note (Signed)
Patient has an ejection fraction of 20%. There is no evidence of heart failure. We will check a 2-D echo when she sees Dr. Myrtis Ser in a month.

## 2011-09-19 NOTE — Assessment & Plan Note (Signed)
Check fasting lipid panel and LFTs. When asked the patient to hold her Crestor for 2-3 days to see if her hand and foot pain goes away.

## 2011-09-19 NOTE — Assessment & Plan Note (Signed)
Status post thoracentesis 2 weeks ago. Now on Lasix because of reaccumulation.

## 2011-09-19 NOTE — Assessment & Plan Note (Signed)
Overall the patient is doing well status post CABG from a cardiac standpoint. She does have a recurrent left pleural effusion and was just restarted on Lasix and is being followed closely by Dr. [name].

## 2011-09-19 NOTE — Patient Instructions (Signed)
Your physician recommends that you schedule a follow-up appointment in: 1 month with Dr Myrtis Ser Your physician recommends that you return for lab work in: today [facility]) Your physician has recommended you make the following change in your medication: You may hold Crestor for 2-3 days to see if pain stops, please call us and let us know.

## 2011-09-19 NOTE — Assessment & Plan Note (Signed)
Patient has significant pain in her hands and feet. Is not typical joint aches and pains. Not sure whether it could be coming from the Crestor but the pain has only been present since discharge from hospital. I asked her to hold it for 2 or 3 days to see if it makes a difference. She is to call us and if it has gone away we need to start another statin. We also will check her potassium.

## 2011-09-24 ENCOUNTER — Other Ambulatory Visit: Payer: Self-pay | Admitting: Thoracic Surgery (Cardiothoracic Vascular Surgery)

## 2011-09-24 DIAGNOSIS — I2510 Atherosclerotic heart disease of native coronary artery without angina pectoris: Secondary | ICD-10-CM

## 2011-09-25 ENCOUNTER — Other Ambulatory Visit: Payer: Self-pay

## 2011-09-25 DIAGNOSIS — J90 Pleural effusion, not elsewhere classified: Secondary | ICD-10-CM

## 2011-09-25 DIAGNOSIS — Z951 Presence of aortocoronary bypass graft: Secondary | ICD-10-CM

## 2011-09-25 MED ORDER — HYDROCODONE-ACETAMINOPHEN 7.5-750 MG PO TABS: 1.0000 | ORAL_TABLET | Freq: Four times a day (QID) | ORAL | Status: DC | PRN

## 2011-09-25 NOTE — Telephone Encounter (Signed)
Rx for Hydrocodone 7.5/500 mg po every 6 hours prn pain #40/0 refill called to pharm

## 2011-09-27 ENCOUNTER — Encounter: Payer: Self-pay | Admitting: *Deleted

## 2011-09-30 ENCOUNTER — Ambulatory Visit
Admission: RE | Admit: 2011-09-30 | Discharge: 2011-09-30 | Disposition: A | Discharge status: Home / Self Care | Payer: Managed Care, Other (non HMO) | Source: Ambulatory Visit | Attending: Thoracic Surgery (Cardiothoracic Vascular Surgery) | Admitting: Thoracic Surgery (Cardiothoracic Vascular Surgery)

## 2011-09-30 ENCOUNTER — Ambulatory Visit (INDEPENDENT_AMBULATORY_CARE_PROVIDER_SITE_OTHER): Payer: Self-pay | Admitting: Thoracic Surgery (Cardiothoracic Vascular Surgery)

## 2011-09-30 ENCOUNTER — Encounter: Payer: Self-pay | Admitting: Thoracic Surgery (Cardiothoracic Vascular Surgery)

## 2011-09-30 VITALS — BP 136/88 | HR 92 | Resp 20 | Ht 67.0 in | Wt 245.0 lb

## 2011-09-30 DIAGNOSIS — I2510 Atherosclerotic heart disease of native coronary artery without angina pectoris: Secondary | ICD-10-CM

## 2011-09-30 DIAGNOSIS — Z951 Presence of aortocoronary bypass graft: Secondary | ICD-10-CM

## 2011-09-30 DIAGNOSIS — J90 Pleural effusion, not elsewhere classified: Secondary | ICD-10-CM

## 2011-09-30 MED ORDER — POTASSIUM CHLORIDE CRYS ER 20 MEQ PO TBCR: 20.0000 meq | EXTENDED_RELEASE_TABLET | Freq: Every day | ORAL | Status: DC

## 2011-09-30 NOTE — Progress Notes (Signed)
                   [address]            [place] [address]          [phone]     CARDIOTHORACIC SURGERY OFFICE NOTE  Referring Provider is Luis Abed, MD PCP is Jacolyn Reedy, PA, PA   HPI:  Patient returns for further follow-up s/p CABG with postop left pleural effusions.  She was last seen in the office 2 weeks ago. Since then she reports continued improvement. She reports no shortness of breath. She states that her blood sugars have remained under good control. She still has significant soreness in her chest but overall things continue to gradually improve. She also has pain and numbness in her hands which is minimally improved.   Current Outpatient Prescriptions  Medication Sig Dispense Refill  . aspirin 325 MG EC tablet Take 325 mg by mouth daily.        . carvedilol (COREG) 6.25 MG tablet Take 3.125 mg by mouth 2 (two) times daily with a meal.       . furosemide (LASIX) 40 MG tablet Take 1 tablet (40 mg total) by mouth 2 (two) times daily.  60 tablet  11  . glipiZIDE (GLUCOTROL) 10 MG tablet Take 5 mg by mouth 2 (two) times daily before a meal.       . HYDROcodone-acetaminophen (VICODIN ES) 7.5-750 MG per tablet Take 1 tablet by mouth every 6 (six) hours as needed for pain.  40 tablet  0  . potassium chloride SA (K-DUR,KLOR-CON) 20 MEQ tablet daily.       . rosuvastatin (CRESTOR) 20 MG tablet Take 20 mg by mouth daily.            Physical Exam:   BP 136/88  Pulse 92  Resp 20  Ht 5\' 7"  (1.702 m)  Wt 245 lb (111.131 kg)  BMI 38.37 kg/m2  SpO2 97% Breath sounds are clear to auscultation and somewhat diminished at both lung bases. Her sternal incision is healing nicely. Extremities are warm and well-perfused. There is mild lower extremity edema.  Diagnostic Tests:  Chest x-ray performed today demonstrates interval decrease in the small left pleural effusion. There is a very small residual effusion at this time.  Impression:  Improvement in  small residual left pleural effusion  Plan:  We will not make any changes in the patient's care at this time. She will return in 2 months for further followup with 1 final chest x-ray to make sure that her pleural effusion has been resolved    Marilu Favre H. [name], MD

## 2011-10-15 ENCOUNTER — Encounter: Payer: Self-pay | Admitting: *Deleted

## 2011-10-21 ENCOUNTER — Other Ambulatory Visit (INDEPENDENT_AMBULATORY_CARE_PROVIDER_SITE_OTHER): Payer: Managed Care, Other (non HMO) | Admitting: *Deleted

## 2011-10-21 ENCOUNTER — Ambulatory Visit (HOSPITAL_COMMUNITY): Payer: Managed Care, Other (non HMO) | Attending: Internal Medicine | Admitting: Radiology

## 2011-10-21 DIAGNOSIS — R072 Precordial pain: Secondary | ICD-10-CM

## 2011-10-21 DIAGNOSIS — E785 Hyperlipidemia, unspecified: Secondary | ICD-10-CM

## 2011-10-21 DIAGNOSIS — I059 Rheumatic mitral valve disease, unspecified: Secondary | ICD-10-CM | POA: Insufficient documentation

## 2011-10-21 DIAGNOSIS — R209 Unspecified disturbances of skin sensation: Secondary | ICD-10-CM

## 2011-10-21 DIAGNOSIS — I079 Rheumatic tricuspid valve disease, unspecified: Secondary | ICD-10-CM | POA: Insufficient documentation

## 2011-10-21 DIAGNOSIS — I519 Heart disease, unspecified: Secondary | ICD-10-CM

## 2011-10-21 LAB — HEPATIC FUNCTION PANEL
ALT: 25 U/L (ref 0–35)
AST: 31 U/L (ref 0–37)
Albumin: 4.2 g/dL (ref 3.5–5.2)
Alkaline Phosphatase: 85 U/L (ref 39–117)
Bilirubin, Direct: 0.1 mg/dL (ref 0.0–0.3)
Total Bilirubin: 0.7 mg/dL (ref 0.3–1.2)
Total Protein: 8 g/dL (ref 6.0–8.3)

## 2011-10-21 LAB — LIPID PANEL
Cholesterol: 147 mg/dL (ref 0–200)
HDL: 40 mg/dL (ref >39.00)
LDL Cholesterol: 90 mg/dL (ref 0–99)
Total CHOL/HDL Ratio: 4
Triglycerides: 87 mg/dL (ref 0.0–149.0)
VLDL: 17.4 mg/dL (ref 0.0–40.0)

## 2011-10-25 ENCOUNTER — Other Ambulatory Visit (INDEPENDENT_AMBULATORY_CARE_PROVIDER_SITE_OTHER): Payer: Managed Care, Other (non HMO)

## 2011-10-25 ENCOUNTER — Encounter: Payer: Self-pay | Admitting: Internal Medicine

## 2011-10-25 ENCOUNTER — Ambulatory Visit: Payer: Managed Care, Other (non HMO) | Admitting: Cardiology

## 2011-10-25 ENCOUNTER — Other Ambulatory Visit (HOSPITAL_COMMUNITY): Payer: Managed Care, Other (non HMO) | Admitting: Radiology

## 2011-10-25 ENCOUNTER — Ambulatory Visit (INDEPENDENT_AMBULATORY_CARE_PROVIDER_SITE_OTHER): Payer: Managed Care, Other (non HMO) | Admitting: Internal Medicine

## 2011-10-25 VITALS — BP 130/80 | HR 84 | Temp 98.9°F | Resp 16 | Ht 66.0 in | Wt 249.0 lb

## 2011-10-25 DIAGNOSIS — M79609 Pain in unspecified limb: Secondary | ICD-10-CM

## 2011-10-25 DIAGNOSIS — D649 Anemia, unspecified: Secondary | ICD-10-CM | POA: Insufficient documentation

## 2011-10-25 DIAGNOSIS — G5600 Carpal tunnel syndrome, unspecified upper limb: Secondary | ICD-10-CM

## 2011-10-25 DIAGNOSIS — M609 Myositis, unspecified: Secondary | ICD-10-CM

## 2011-10-25 DIAGNOSIS — E785 Hyperlipidemia, unspecified: Secondary | ICD-10-CM

## 2011-10-25 DIAGNOSIS — E669 Obesity, unspecified: Secondary | ICD-10-CM

## 2011-10-25 DIAGNOSIS — M79643 Pain in unspecified hand: Secondary | ICD-10-CM

## 2011-10-25 DIAGNOSIS — E119 Type 2 diabetes mellitus without complications: Secondary | ICD-10-CM

## 2011-10-25 DIAGNOSIS — Z0000 Encounter for general adult medical examination without abnormal findings: Secondary | ICD-10-CM

## 2011-10-25 DIAGNOSIS — IMO0001 Reserved for inherently not codable concepts without codable children: Secondary | ICD-10-CM

## 2011-10-25 DIAGNOSIS — Z1231 Encounter for screening mammogram for malignant neoplasm of breast: Secondary | ICD-10-CM

## 2011-10-25 DIAGNOSIS — E1169 Type 2 diabetes mellitus with other specified complication: Secondary | ICD-10-CM

## 2011-10-25 LAB — CBC WITH DIFFERENTIAL/PLATELET
Basophils Relative: 0.6 % (ref 0.0–3.0)
Eosinophils Relative: 6.3 % — ABNORMAL HIGH (ref 0.0–5.0)
Hemoglobin: 12.4 g/dL (ref 12.0–15.0)
Lymphocytes Relative: 33.5 % (ref 12.0–46.0)
MCHC: 32.9 g/dL (ref 30.0–36.0)
Monocytes Relative: 6.9 % (ref 3.0–12.0)
Neutro Abs: 3 10*3/uL (ref 1.4–7.7)
RBC: 4.17 Mil/uL (ref 3.87–5.11)
WBC: 5.6 10*3/uL (ref 4.5–10.5)

## 2011-10-25 LAB — LIPID PANEL
Cholesterol: 139 mg/dL (ref 0–200)
LDL Cholesterol: 79 mg/dL (ref 0–99)
Total CHOL/HDL Ratio: 3

## 2011-10-25 LAB — COMPREHENSIVE METABOLIC PANEL
Albumin: 3.9 g/dL (ref 3.5–5.2)
BUN: 16 mg/dL (ref 6–23)
CO2: 27 mEq/L (ref 19–32)
Calcium: 9.3 mg/dL (ref 8.4–10.5)
Chloride: 108 mEq/L (ref 96–112)
Glucose, Bld: 135 mg/dL — ABNORMAL HIGH (ref 70–99)
Potassium: 4 mEq/L (ref 3.5–5.1)
Total Protein: 7.9 g/dL (ref 6.0–8.3)

## 2011-10-25 LAB — URINALYSIS, ROUTINE W REFLEX MICROSCOPIC
Nitrite: NEGATIVE
Specific Gravity, Urine: >=1.03 (ref 1.000–1.030)
Total Protein, Urine: 100
Urine Glucose: NEGATIVE
pH: 6 (ref 5.0–8.0)

## 2011-10-25 LAB — HM DIABETES EYE EXAM

## 2011-10-25 LAB — TSH: TSH: 1.37 u[IU]/mL (ref 0.35–5.50)

## 2011-10-25 LAB — SEDIMENTATION RATE: Sed Rate: 41 mm/hr — ABNORMAL HIGH (ref 0–22)

## 2011-10-25 LAB — HM DIABETES FOOT EXAM: HM Diabetic Foot Exam: NORMAL

## 2011-10-25 LAB — HM MAMMOGRAPHY

## 2011-10-25 LAB — IBC PANEL: Iron: 50 ug/dL (ref 42–145)

## 2011-10-25 MED ORDER — OXYCODONE HCL 5 MG PO TABS: 5.0000 mg | ORAL_TABLET | ORAL | Status: AC | PRN

## 2011-10-25 MED ORDER — OLMESARTAN MEDOXOMIL 20 MG PO TABS: 20.0000 mg | ORAL_TABLET | Freq: Every day | ORAL | Status: DC

## 2011-10-25 NOTE — Patient Instructions (Signed)
Carpal Tunnel Syndrome The carpal tunnel is a narrow hollow area in the wrist. It is formed by the wrist bones and ligaments. Nerves, blood vessels, and tendons (cord like structures which attach muscle to bone) on the palm side (the side of your hand in the direction your fingers bend) of your hand pass through the carpal tunnel. Repeated wrist motion or certain diseases may cause swelling within the tunnel. (That is why these are called repetitive trauma (damage caused by over use) disorders. It is also a common problem in late pregnancy.) This swelling pinches the main nerve in the wrist (median nerve) and causes the painful condition called carpal tunnel syndrome. A feeling of "pins and needles" may be noticed in the fingers or hand; however, the entire arm may ache from this condition. Carpal tunnel syndrome may clear up by itself. Cortisone injections may help. Sometimes, an operation may be needed to free the pinched nerve. An electromyogram (a type of test) may be needed to confirm this diagnosis (learning what is wrong). This is a test which measures nerve conduction. The nerve conduction is usually slowed in a carpal tunnel syndrome. HOME CARE INSTRUCTIONS   If your caregiver prescribed medication to help reduce swelling, take as directed.   If you were given a splint to keep your wrist from bending, use it as instructed. It is important to wear the splint at night. Use the splint for as long as you have pain or numbness in your hand, arm or wrist. This may take 1 to 2 months.   If you have pain at night, it may help to rub or shake your hand, or elevate your hand above the level of your heart (the center of your chest).   It is important to give your wrist a rest by stopping the activities that are causing the problem. If your symptoms (problems) are work-related, you may need to talk to your employer about changing to a job that does not require using your wrist.   Only take over-the-counter  or prescription medicines for pain, discomfort, or fever as directed by your caregiver.   Following periods of extended use, particularly strenuous use, apply an ice pack wrapped in a towel to the anterior (palm) side of the affected wrist for 20 to 30 minutes. Repeat as needed three to four times per day. This will help reduce the swelling.   Follow all instructions for follow-up with your caregiver. This includes any orthopedic referrals, physical therapy, and rehabilitation. Any delay in obtaining necessary care could result in a delay or failure of your condition to heal.  SEEK IMMEDIATE MEDICAL CARE IF:   You are still having pain and numbness following a week of treatment.   You develop new, unexplained symptoms.   Your current symptoms are getting worse and are not helped or controlled with medications.  MAKE SURE YOU:   Understand these instructions.   Will watch your condition.   Will get help right away if you are not doing well or get worse.  Document Released: 10/25/2000 Document Revised: 07/10/2011 Document Reviewed: 06/01/2008 [facility] Patient Information 2012 [place], [place].Diabetes, Type 2 Diabetes is a long-lasting (chronic) disease. In type 2 diabetes, the pancreas does not make enough insulin (a hormone), and the body does not respond normally to the insulin that is made. This type of diabetes was also previously called adult-onset diabetes. It usually occurs after the age of 3, but it can occur at any age.  CAUSES  Type 2  diabetes happens because the pancreasis not making enough insulin or your body has trouble using the insulin that your pancreas does make properly. SYMPTOMS   Drinking more than usual.   Urinating more than usual.   Blurred vision.   Dry, itchy skin.   Frequent infections.   Feeling more tired than usual (fatigue).  DIAGNOSIS The diagnosis of type 2 diabetes is usually made by one of the following tests:  Fasting blood glucose test. You  will not eat for at least 8 hours and then take a blood test.   Random blood glucose test. Your blood glucose (sugar) is checked at any time of the day regardless of when you ate.   Oral glucose tolerance test (OGTT). Your blood glucose is measured after you have not eaten (fasted) and then after you drink a glucose containing beverage.  TREATMENT   Healthy eating.   Exercise.   Medicine, if needed.   Monitoring blood glucose.   Seeing your caregiver regularly.  HOME CARE INSTRUCTIONS   Check your blood glucose at least once a day. More frequent monitoring may be necessary, depending on your medicines and on how well your diabetes is controlled. Your caregiver will advise you.   Take your medicine as directed by your caregiver.   Do not smoke.   Make wise food choices. Ask your caregiver for information. Weight loss can improve your diabetes.   Learn about low blood glucose (hypoglycemia) and how to treat it.   Get your eyes checked regularly.   Have a yearly physical exam. Have your blood pressure checked and your blood and urine tested.   Wear a pendant or bracelet saying that you have diabetes.   Check your feet every night for cuts, sores, blisters, and redness. Let your caregiver know if you have any problems.  SEEK MEDICAL CARE IF:   You have problems keeping your blood glucose in target range.   You have problems with your medicines.   You have symptoms of an illness that do not improve after 24 hours.   You have a sore or wound that is not healing.   You notice a change in vision or a new problem with your vision.   You have a fever.  MAKE SURE YOU:  Understand these instructions.   Will watch your condition.   Will get help right away if you are not doing well or get worse.  Document Released: 10/28/2005 Document Revised: 07/11/2011 Document Reviewed: 04/15/2011 [facility] Patient Information 2012 [place], [place].

## 2011-10-25 NOTE — Assessment & Plan Note (Signed)
I am suspicious that this is the cause of her pain so I have asked her to get a NCS/EMG done and I gave her an Rx of oxycodone for the pain

## 2011-10-25 NOTE — Progress Notes (Signed)
Addended by: Etta Grandchild on: 10/25/2011 12:00 PM   Modules accepted: Orders

## 2011-10-25 NOTE — Assessment & Plan Note (Signed)
She has an unusual pain syndrome happening, I will start evaluating her for myopathy, inflammatory disease, CT, RA, B12 deficiency, etc. She will take oxy for the pain.

## 2011-10-25 NOTE — Assessment & Plan Note (Signed)
I will recheck her CBC and will check her vitamin levels as well 

## 2011-10-25 NOTE — Assessment & Plan Note (Signed)
She is due for an a1c check, also I will monitor her renal function, she needs to start an ARB for renal protection as well

## 2011-10-25 NOTE — Progress Notes (Signed)
Subjective:    Patient ID: Christine Knight, female    DOB: [birth year], 60 y.o.   MRN: [id number]  Diabetes She presents for her follow-up diabetic visit. She has type 2 diabetes mellitus. The initial diagnosis of diabetes was made 3 months ago. Her disease course has been stable. There are no hypoglycemic associated symptoms. Pertinent negatives for hypoglycemia include no confusion, dizziness, headaches, nervousness/anxiousness, pallor, seizures, speech difficulty or tremors. Pertinent negatives for diabetes include no blurred vision, no chest pain, no fatigue, no foot paresthesias, no foot ulcerations, no polydipsia, no polyphagia, no polyuria, no visual change, no weakness and no weight loss. There are no hypoglycemic complications. Symptoms are stable. Diabetic complications include heart disease and peripheral neuropathy. Current diabetic treatment includes oral agent (monotherapy). She is compliant with treatment all of the time. Her weight is stable. She is following a generally healthy diet. Meal planning includes avoidance of concentrated sweets. She has not had a previous visit with a dietician. She never participates in exercise. There is no change in her home blood glucose trend. Her breakfast blood glucose range is generally 70-90 mg/dl. Her lunch blood glucose range is generally 90-110 mg/dl. Her dinner blood glucose range is generally 140-180 mg/dl. Her highest blood glucose is 140-180 mg/dl. Her overall blood glucose range is 130-140 mg/dl. An ACE inhibitor/angiotensin II receptor blocker is not being taken. She does not see a podiatrist.Eye exam is not current.   New to me she complains of debilitating bilateral hand pain that started a few days after she had CABG, she describes the pain as burning, sharp, and stabbing throughout both of her fingers and around her wrists. She also has some numbness and tingling. She was given oxycodone 5 mg post-op and that controlled the pain but she has  been out of meds for one month and reports that the pain keeps her awake at night and it interferes with her daily activities. She is distraught over this pain. Review of Systems  Constitutional: Negative for fever, chills, weight loss, diaphoresis, activity change, appetite change, fatigue and unexpected weight change.  HENT: Negative.   Eyes: Negative.  Negative for blurred vision.  Respiratory: Negative for cough, chest tightness, shortness of breath, wheezing and stridor.   Cardiovascular: Negative for chest pain, palpitations and leg swelling.  Gastrointestinal: Negative for nausea, vomiting, abdominal pain, diarrhea, constipation, blood in stool and abdominal distention.  Genitourinary: Negative for dysuria, urgency, polyuria, frequency, hematuria, flank pain, decreased urine volume, enuresis, difficulty urinating and dyspareunia.  Musculoskeletal: Positive for myalgias and arthralgias. Negative for back pain, joint swelling and gait problem.  Skin: Negative for color change, pallor, rash and wound.  Neurological: Positive for numbness. Negative for dizziness, tremors, seizures, syncope, facial asymmetry, speech difficulty, weakness, light-headedness and headaches.  Hematological: Negative for polydipsia, polyphagia and adenopathy. Does not bruise/bleed easily.  Psychiatric/Behavioral: Positive for dysphoric mood. Negative for suicidal ideas, hallucinations, behavioral problems, confusion, sleep disturbance, self-injury, decreased concentration and agitation. The patient is not nervous/anxious and is not hyperactive.        Objective:   Physical Exam  Constitutional: She is oriented to person, place, and time. She appears well-developed and well-nourished.  Non-toxic appearance. She does not have a sickly appearance. She does not appear ill. She appears distressed (tearful).  HENT:  Head: Normocephalic and atraumatic.  Mouth/Throat: Oropharynx is clear and moist. No oropharyngeal exudate.   Eyes: Conjunctivae are normal. Right eye exhibits no discharge. Left eye exhibits no discharge. No scleral icterus.  Neck: Normal  range of motion. Neck supple. No JVD present. No tracheal deviation present. No thyromegaly present.  Cardiovascular: Normal rate, regular rhythm, normal heart sounds and intact distal pulses.  Exam reveals no gallop and no friction rub.   No murmur heard. Pulses:      Carotid pulses are 1+ on the right side, and 1+ on the left side.      Radial pulses are 1+ on the right side, and 1+ on the left side.       Femoral pulses are 1+ on the right side, and 1+ on the left side.      Popliteal pulses are 1+ on the right side, and 1+ on the left side.       Dorsalis pedis pulses are 1+ on the right side, and 1+ on the left side.       Posterior tibial pulses are 1+ on the right side, and 1+ on the left side.  Pulmonary/Chest: Effort normal and breath sounds normal. No stridor. No respiratory distress. She has no decreased breath sounds. She has no wheezes. She has no rhonchi. She has no rales. She exhibits no tenderness.    Abdominal: Soft. Bowel sounds are normal. She exhibits no distension and no mass. There is no tenderness. There is no rebound and no guarding.  Musculoskeletal: Normal range of motion. She exhibits no edema and no tenderness.       Right hand: Normal. She exhibits normal range of motion, no tenderness, no bony tenderness, normal capillary refill, no deformity, no laceration and no swelling. normal sensation noted. Normal strength noted.       Left hand: Normal. She exhibits normal range of motion, no tenderness, no bony tenderness, normal capillary refill, no deformity, no laceration and no swelling. normal sensation noted. Normal strength noted.       + Tinel's and Phalen's tests in both wrists  Lymphadenopathy:    She has no cervical adenopathy.  Neurological: She is alert and oriented to person, place, and time. She displays no atrophy, no tremor and  normal reflexes. No cranial nerve deficit or sensory deficit. She exhibits normal muscle tone. She displays no seizure activity. Coordination and gait normal. She displays no Babinski's sign on the right side. She displays Babinski's sign on the left side.  Reflex Scores:      Tricep reflexes are 1+ on the right side and 1+ on the left side.      Bicep reflexes are 1+ on the right side and 1+ on the left side.      Brachioradialis reflexes are 1+ on the right side and 1+ on the left side.      Patellar reflexes are 1+ on the right side and 1+ on the left side.      Achilles reflexes are 1+ on the right side and 1+ on the left side. Skin: Skin is warm and dry. No rash noted. She is not diaphoretic. No erythema. No pallor.  Psychiatric: She has a normal mood and affect. Her behavior is normal. Judgment and thought content normal.      Lab Results  Component Value Date   WBC 10.4 08/26/2011   HGB 9.1* 08/26/2011   HCT 26.9* 08/26/2011   PLT 532* 08/26/2011   GLUCOSE 107* 09/19/2011   CHOL 147 10/21/2011   TRIG 87.0 10/21/2011   HDL 40.00 10/21/2011   LDLCALC 90 10/21/2011   ALT 25 10/21/2011   AST 31 10/21/2011   NA 139 09/19/2011   K  4.1 09/19/2011   CL 105 09/19/2011   CREATININE 0.7 09/19/2011   BUN 10 09/19/2011   CO2 27 09/19/2011   TSH 0.486 08/09/2011   INR 1.09 08/21/2011   HGBA1C 10.2* 08/12/2011      Assessment & Plan:

## 2011-10-25 NOTE — Assessment & Plan Note (Signed)
IF her CK level is elevated then she will stop or change the crestor and if no other causes for the pain are found I will change to another statin to see if that helps, I will check her FLP and other labs today

## 2011-10-25 NOTE — Assessment & Plan Note (Signed)
I am not sure what the cause of this is, I have started an extensive evaluation

## 2011-10-28 ENCOUNTER — Other Ambulatory Visit: Payer: Self-pay | Admitting: Internal Medicine

## 2011-10-28 DIAGNOSIS — E1169 Type 2 diabetes mellitus with other specified complication: Secondary | ICD-10-CM

## 2011-10-31 ENCOUNTER — Encounter: Payer: Self-pay | Admitting: *Deleted

## 2011-11-06 ENCOUNTER — Encounter: Payer: Self-pay | Admitting: Cardiology

## 2011-11-08 ENCOUNTER — Ambulatory Visit: Payer: Self-pay | Admitting: Cardiology

## 2011-11-13 ENCOUNTER — Other Ambulatory Visit: Payer: Self-pay

## 2011-11-13 ENCOUNTER — Emergency Department (HOSPITAL_COMMUNITY): Payer: Managed Care, Other (non HMO)

## 2011-11-13 ENCOUNTER — Encounter (HOSPITAL_COMMUNITY): Payer: Self-pay | Admitting: *Deleted

## 2011-11-13 ENCOUNTER — Inpatient Hospital Stay (HOSPITAL_COMMUNITY)
Admission: EM | Admit: 2011-11-13 | Discharge: 2011-11-15 | DRG: 292 | Disposition: A | Discharge status: Home / Self Care | Payer: Managed Care, Other (non HMO) | Attending: Cardiology | Admitting: Cardiology

## 2011-11-13 DIAGNOSIS — I5023 Acute on chronic systolic (congestive) heart failure: Principal | ICD-10-CM | POA: Diagnosis present

## 2011-11-13 DIAGNOSIS — IMO0002 Reserved for concepts with insufficient information to code with codable children: Secondary | ICD-10-CM | POA: Insufficient documentation

## 2011-11-13 DIAGNOSIS — I2510 Atherosclerotic heart disease of native coronary artery without angina pectoris: Secondary | ICD-10-CM | POA: Diagnosis present

## 2011-11-13 DIAGNOSIS — E1140 Type 2 diabetes mellitus with diabetic neuropathy, unspecified: Secondary | ICD-10-CM | POA: Insufficient documentation

## 2011-11-13 DIAGNOSIS — E669 Obesity, unspecified: Secondary | ICD-10-CM

## 2011-11-13 DIAGNOSIS — E785 Hyperlipidemia, unspecified: Secondary | ICD-10-CM | POA: Insufficient documentation

## 2011-11-13 DIAGNOSIS — Z7982 Long term (current) use of aspirin: Secondary | ICD-10-CM

## 2011-11-13 DIAGNOSIS — I2589 Other forms of chronic ischemic heart disease: Secondary | ICD-10-CM | POA: Diagnosis present

## 2011-11-13 DIAGNOSIS — Z6841 Body Mass Index (BMI) 40.0 and over, adult: Secondary | ICD-10-CM

## 2011-11-13 DIAGNOSIS — Z951 Presence of aortocoronary bypass graft: Secondary | ICD-10-CM | POA: Insufficient documentation

## 2011-11-13 DIAGNOSIS — E119 Type 2 diabetes mellitus without complications: Secondary | ICD-10-CM | POA: Diagnosis present

## 2011-11-13 DIAGNOSIS — I252 Old myocardial infarction: Secondary | ICD-10-CM

## 2011-11-13 DIAGNOSIS — R079 Chest pain, unspecified: Secondary | ICD-10-CM

## 2011-11-13 DIAGNOSIS — R0602 Shortness of breath: Secondary | ICD-10-CM

## 2011-11-13 DIAGNOSIS — I509 Heart failure, unspecified: Secondary | ICD-10-CM | POA: Diagnosis present

## 2011-11-13 HISTORY — DX: Pleural effusion, not elsewhere classified: J90

## 2011-11-13 HISTORY — DX: Essential (primary) hypertension: I10

## 2011-11-13 HISTORY — DX: Carpal tunnel syndrome, bilateral upper limbs: G56.03

## 2011-11-13 HISTORY — DX: Nonrheumatic mitral (valve) insufficiency: I34.0

## 2011-11-13 LAB — PRO B NATRIURETIC PEPTIDE: Pro B Natriuretic peptide (BNP): 1260 pg/mL — ABNORMAL HIGH (ref 0–125)

## 2011-11-13 LAB — DIFFERENTIAL
Basophils Absolute: 0 10*3/uL (ref 0.0–0.1)
Basophils Relative: 1 % (ref 0–1)
Eosinophils Absolute: 0.2 10*3/uL (ref 0.0–0.7)
Eosinophils Relative: 3 % (ref 0–5)
Lymphocytes Relative: 30 % (ref 12–46)
Lymphs Abs: 1.6 10*3/uL (ref 0.7–4.0)
Monocytes Absolute: 0.3 10*3/uL (ref 0.1–1.0)
Monocytes Relative: 6 % (ref 3–12)
Neutro Abs: 3.2 10*3/uL (ref 1.7–7.7)
Neutrophils Relative %: 60 % (ref 43–77)

## 2011-11-13 LAB — POCT I-STAT, CHEM 8
BUN: 14 mg/dL (ref 6–23)
Calcium, Ion: 1.21 mmol/L (ref 1.12–1.32)
Chloride: 108 mEq/L (ref 96–112)
Creatinine, Ser: 0.6 mg/dL (ref 0.50–1.10)
Glucose, Bld: 156 mg/dL — ABNORMAL HIGH (ref 70–99)
HCT: 43 % (ref 36.0–46.0)
Hemoglobin: 14.6 g/dL (ref 12.0–15.0)
Potassium: 4 mEq/L (ref 3.5–5.1)
Sodium: 142 mEq/L (ref 135–145)
TCO2: 25 mmol/L (ref 0–100)

## 2011-11-13 LAB — CBC
HCT: 39.8 % (ref 36.0–46.0)
HCT: 41.4 % (ref 36.0–46.0)
Hemoglobin: 13.3 g/dL (ref 12.0–15.0)
MCH: 29.4 pg (ref 26.0–34.0)
MCH: 28.9 pg (ref 26.0–34.0)
MCHC: 33.4 g/dL (ref 30.0–36.0)
MCHC: 32.9 g/dL (ref 30.0–36.0)
MCV: 88.1 fL (ref 78.0–100.0)
MCV: 87.9 fL (ref 78.0–100.0)
Platelets: 270 10*3/uL (ref 150–400)
RBC: 4.52 MIL/uL (ref 3.87–5.11)
RDW: 14.5 % (ref 11.5–15.5)
RDW: 14.3 % (ref 11.5–15.5)
WBC: 5.3 10*3/uL (ref 4.0–10.5)

## 2011-11-13 LAB — POCT I-STAT TROPONIN I: Troponin i, poc: 0.04 ng/mL (ref 0.00–0.08)

## 2011-11-13 LAB — CARDIAC PANEL(CRET KIN+CKTOT+MB+TROPI)
Relative Index: 1.9 (ref 0.0–2.5)
Troponin I: <0.3 ng/mL (ref <0.30)

## 2011-11-13 MED ORDER — ZOLPIDEM TARTRATE 5 MG PO TABS: 10.0000 mg | ORAL_TABLET | Freq: Every evening | ORAL | Status: DC | PRN

## 2011-11-13 MED ORDER — CARVEDILOL 6.25 MG PO TABS
6.2500 mg | ORAL_TABLET | Freq: Two times a day (BID) | ORAL | Status: DC
  Administered 2011-11-14 – 2011-11-15 (×2): 6.25 mg via ORAL
  Filled 2011-11-13 (×4): qty 1

## 2011-11-13 MED ORDER — POTASSIUM CHLORIDE CRYS ER 20 MEQ PO TBCR
20.0000 meq | EXTENDED_RELEASE_TABLET | Freq: Every day | ORAL | Status: DC
  Administered 2011-11-13 – 2011-11-15 (×3): 20 meq via ORAL
  Filled 2011-11-13 (×3): qty 1

## 2011-11-13 MED ORDER — FUROSEMIDE 10 MG/ML IJ SOLN
INTRAMUSCULAR | Status: AC
  Administered 2011-11-13: 40 mg via INTRAVENOUS
  Filled 2011-11-13: qty 4

## 2011-11-13 MED ORDER — SODIUM CHLORIDE 0.9 % IV SOLN: 250.0000 mL | INTRAVENOUS | Status: DC | PRN

## 2011-11-13 MED ORDER — ASPIRIN 81 MG PO CHEW
324.0000 mg | CHEWABLE_TABLET | Freq: Once | ORAL | Status: AC
  Administered 2011-11-13: 324 mg via ORAL
  Filled 2011-11-13: qty 4

## 2011-11-13 MED ORDER — SPIRONOLACTONE 25 MG PO TABS
25.0000 mg | ORAL_TABLET | Freq: Every day | ORAL | Status: DC
  Administered 2011-11-13 – 2011-11-15 (×3): 25 mg via ORAL
  Filled 2011-11-13 (×3): qty 1

## 2011-11-13 MED ORDER — GLIPIZIDE 5 MG PO TABS
5.0000 mg | ORAL_TABLET | Freq: Two times a day (BID) | ORAL | Status: DC
  Administered 2011-11-14 – 2011-11-15 (×3): 5 mg via ORAL
  Filled 2011-11-13 (×5): qty 1

## 2011-11-13 MED ORDER — ASPIRIN EC 81 MG PO TBEC
81.0000 mg | DELAYED_RELEASE_TABLET | Freq: Every day | ORAL | Status: DC
  Administered 2011-11-14 – 2011-11-15 (×2): 81 mg via ORAL
  Filled 2011-11-13 (×2): qty 1

## 2011-11-13 MED ORDER — ROSUVASTATIN CALCIUM 20 MG PO TABS
20.0000 mg | ORAL_TABLET | Freq: Every day | ORAL | Status: DC
  Administered 2011-11-13 – 2011-11-14 (×2): 20 mg via ORAL
  Filled 2011-11-13 (×3): qty 1

## 2011-11-13 MED ORDER — CARVEDILOL 6.25 MG PO TABS
6.2500 mg | ORAL_TABLET | Freq: Two times a day (BID) | ORAL | Status: DC
  Administered 2011-11-13 – 2011-11-14 (×3): 6.25 mg via ORAL
  Filled 2011-11-13 (×7): qty 1

## 2011-11-13 MED ORDER — FUROSEMIDE 10 MG/ML IJ SOLN: 80.0000 mg | Freq: Once | INTRAMUSCULAR | Status: DC

## 2011-11-13 MED ORDER — INSULIN ASPART 100 UNIT/ML ~~LOC~~ SOLN
0.0000 [IU] | Freq: Three times a day (TID) | SUBCUTANEOUS | Status: DC
  Administered 2011-11-14: 1 [IU] via SUBCUTANEOUS
  Administered 2011-11-14: 2 [IU] via SUBCUTANEOUS
  Administered 2011-11-15: 1 [IU] via SUBCUTANEOUS
  Filled 2011-11-13: qty 3

## 2011-11-13 MED ORDER — ONDANSETRON HCL 4 MG/2ML IJ SOLN: 4.0000 mg | Freq: Four times a day (QID) | INTRAMUSCULAR | Status: DC | PRN

## 2011-11-13 MED ORDER — OXYCODONE HCL 5 MG PO TABS
5.0000 mg | ORAL_TABLET | Freq: Four times a day (QID) | ORAL | Status: DC | PRN
  Administered 2011-11-13 – 2011-11-15 (×4): 5 mg via ORAL
  Filled 2011-11-13 (×4): qty 1

## 2011-11-13 MED ORDER — SODIUM CHLORIDE 0.9 % IJ SOLN
3.0000 mL | Freq: Two times a day (BID) | INTRAMUSCULAR | Status: DC
  Administered 2011-11-13 – 2011-11-15 (×4): 3 mL via INTRAVENOUS

## 2011-11-13 MED ORDER — FUROSEMIDE 10 MG/ML IJ SOLN
40.0000 mg | Freq: Three times a day (TID) | INTRAMUSCULAR | Status: DC
  Administered 2011-11-13 – 2011-11-14 (×2): 40 mg via INTRAVENOUS
  Filled 2011-11-13 (×4): qty 4

## 2011-11-13 MED ORDER — HEPARIN SODIUM (PORCINE) 5000 UNIT/ML IJ SOLN
5000.0000 [IU] | Freq: Three times a day (TID) | INTRAMUSCULAR | Status: DC
  Administered 2011-11-13 – 2011-11-15 (×5): 5000 [IU] via SUBCUTANEOUS
  Filled 2011-11-13 (×8): qty 1

## 2011-11-13 MED ORDER — SODIUM CHLORIDE 0.9 % IJ SOLN: 3.0000 mL | INTRAMUSCULAR | Status: DC | PRN

## 2011-11-13 MED ORDER — LISINOPRIL 5 MG PO TABS
5.0000 mg | ORAL_TABLET | Freq: Every day | ORAL | Status: DC
  Administered 2011-11-13 – 2011-11-15 (×3): 5 mg via ORAL
  Filled 2011-11-13 (×3): qty 1

## 2011-11-13 MED ORDER — ALPRAZOLAM 0.25 MG PO TABS: 0.2500 mg | ORAL_TABLET | Freq: Two times a day (BID) | ORAL | Status: DC | PRN

## 2011-11-13 MED ORDER — NITROGLYCERIN 0.4 MG SL SUBL
0.4000 mg | SUBLINGUAL_TABLET | Freq: Once | SUBLINGUAL | Status: AC
Start: 2011-11-13 — End: 2011-11-13
  Administered 2011-11-13: 0.4 mg via SUBLINGUAL
  Filled 2011-11-13: qty 25

## 2011-11-13 MED ORDER — ACETAMINOPHEN 325 MG PO TABS: 650.0000 mg | ORAL_TABLET | ORAL | Status: DC | PRN

## 2011-11-13 NOTE — ED Notes (Signed)
[id number] Ready

## 2011-11-13 NOTE — ED Notes (Addendum)
Pt had 4x cabg in Oct.  Pt states that for past 6 days she has had chest pain that is bilateral and feels like a burning in her chest. Pain is not constant and feels like shooting pain.  She has had SOB.  Pt experiencing anxiety over her SOB. Denies N/V and was not sweating.  Pt states SOB is increased when lying flat.

## 2011-11-13 NOTE — ED Provider Notes (Signed)
History     CSN: [id number]  Arrival date & time 11/13/11  1724   First MD Initiated Contact with Patient 11/13/11 1811      Chief Complaint  Patient presents with  . Chest Pain   HPI Patient presents to the emergency room with complaint of several days of chest pain that lasts for a few minutes at a time. States that it is unchanged with exertion. Reports associated shortness of breath, worse with laying down. Denies any exertional sob. Denies any leg pain. Reports some increased swelling in her legs. Patient is on lasix for CHF. Patient had a recent CABG at the end of September. No other complaints. No abdominal pain or nausea.   Past Medical History  Diagnosis Date  . Obesity, morbid   . CHF (congestive heart failure)   . History of tobacco abuse   . Myocardial infarction 08/10/11    non-STEMI  . Hyperlipidemia 08/10/11    newly diagnosed  . Diabetes mellitus type 2 in obese 08/10/11/    discovered this admission  . CAD (coronary artery disease) 08/30/2011  . Ischemic cardiomyopathy 08/13/2011    EF 20%  . Hx of CABG     [place], 2012    Past Surgical History  Procedure Date  . Cholecystectomy   . Coronary artery bypass graft 08/13/11    CABG x4 with LIMA to LAD, SVG to Diag, SVG to OM, SVG to PDA, EVH via both thighs    Family History  Problem Relation Age of Onset  . Heart disease Father   . Cancer Neg Hx   . Diabetes Neg Hx   . Hypertension Neg Hx   . Kidney disease Neg Hx     History  Substance Use Topics  . Smoking status: Former Smoker    Types: Cigarettes    Quit date: 11/11/1978  . Smokeless tobacco: Never Used  . Alcohol Use: No    OB History    Grav Para Term Preterm Abortions TAB SAB Ect Mult Living                  Review of Systems  Constitutional: Negative for fever, chills, diaphoresis and appetite change.  HENT: Negative for neck pain.   Eyes: Negative for photophobia and visual disturbance.  Respiratory: Positive for shortness of breath.  Negative for cough and chest tightness.   Cardiovascular: Positive for chest pain and leg swelling.  Gastrointestinal: Negative for nausea, vomiting and abdominal pain.  Genitourinary: Negative for flank pain.  Musculoskeletal: Negative for back pain.  Skin: Negative for rash.  Neurological: Negative for weakness and numbness.  All other systems reviewed and are negative.    Allergies  Review of patient's allergies indicates no known allergies.  Home Medications   Current Outpatient Rx  Name Route Sig Dispense Refill  . ASPIRIN 325 MG PO TBEC Oral Take 325 mg by mouth daily.      Marland Kitchen CARVEDILOL 6.25 MG PO TABS Oral Take 3.125 mg by mouth 2 (two) times daily with a meal.     . FUROSEMIDE 40 MG PO TABS Oral Take 40 mg by mouth 2 (two) times daily.      Marland Kitchen GLIPIZIDE 10 MG PO TABS Oral Take 5 mg by mouth 2 (two) times daily before a meal.     . OXYCODONE HCL 5 MG PO CAPS Oral Take 5 mg by mouth every 4 (four) hours as needed. For pain     . POTASSIUM CHLORIDE CRYS ER  20 MEQ PO TBCR Oral Take 20 mEq by mouth daily.      Marland Kitchen ROSUVASTATIN CALCIUM 20 MG PO TABS Oral Take 20 mg by mouth daily.       BP 142/111  Pulse 99  Temp(Src) 98.5 F (36.9 C) (Oral)  SpO2 95%  Physical Exam  Nursing note and vitals reviewed. Constitutional: She is oriented to person, place, and time. She appears well-developed and well-nourished.  Non-toxic appearance. No distress.       Vital signs stable  HENT:  Head: Normocephalic and atraumatic.  Eyes: EOM are normal. Pupils are equal, round, and reactive to light.  Neck: Normal range of motion. Neck supple.  Cardiovascular: Normal rate, regular rhythm, normal heart sounds and intact distal pulses.  Exam reveals no gallop and no friction rub.   No murmur heard. Pulmonary/Chest: Effort normal and breath sounds normal. No respiratory distress. She has no wheezes. She exhibits no tenderness.       Crackles heard at the base of lungs. CABG scar, well healing. No  signs of infection.   Abdominal: Soft. Bowel sounds are normal. There is no tenderness. There is no rebound and no guarding.  Musculoskeletal: Normal range of motion. She exhibits no edema and no tenderness.  Neurological: She is alert and oriented to person, place, and time. She exhibits normal muscle tone.  Skin: Skin is warm and dry. No rash noted. She is not diaphoretic.  Psychiatric: She has a normal mood and affect. Her behavior is normal. Judgment and thought content normal.    ED Course  Procedures (including critical care time)  Patient seen and evaluated.  VSS reviewed. . Nursing notes reviewed.  Initial testing ordered. Will monitor the patient closely. They agree with the treatment plan and diagnosis.   Results for orders placed during the hospital encounter of 11/13/11  CBC      Component Value Range   WBC 5.3  4.0 - 10.5 (K/uL)   RBC 4.52  3.87 - 5.11 (MIL/uL)   Hemoglobin 13.3  12.0 - 15.0 (g/dL)   HCT 09.8  11.9 - 14.7 (%)   MCV 88.1  78.0 - 100.0 (fL)   MCH 29.4  26.0 - 34.0 (pg)   MCHC 33.4  30.0 - 36.0 (g/dL)   RDW 82.9  56.2 - 13.0 (%)   Platelets 270  150 - 400 (K/uL)  DIFFERENTIAL      Component Value Range   Neutrophils Relative 60  43 - 77 (%)   Neutro Abs 3.2  1.7 - 7.7 (K/uL)   Lymphocytes Relative 30  12 - 46 (%)   Lymphs Abs 1.6  0.7 - 4.0 (K/uL)   Monocytes Relative 6  3 - 12 (%)   Monocytes Absolute 0.3  0.1 - 1.0 (K/uL)   Eosinophils Relative 3  0 - 5 (%)   Eosinophils Absolute 0.2  0.0 - 0.7 (K/uL)   Basophils Relative 1  0 - 1 (%)   Basophils Absolute 0.0  0.0 - 0.1 (K/uL)  POCT I-STAT, CHEM 8      Component Value Range   Sodium 142  135 - 145 (mEq/L)   Potassium 4.0  3.5 - 5.1 (mEq/L)   Chloride 108  96 - 112 (mEq/L)   BUN 14  6 - 23 (mg/dL)   Creatinine, Ser 8.65  0.50 - 1.10 (mg/dL)   Glucose, Bld 784 (*) 70 - 99 (mg/dL)   Calcium, Ion 6.96  2.95 - 1.32 (mmol/L)   TCO2 25  0 - 100 (mmol/L)   Hemoglobin 14.6  12.0 - 15.0 (g/dL)   HCT  16.1  09.6 - 04.5 (%)  POCT I-STAT TROPONIN I      Component Value Range   Troponin i, poc 0.04  0.00 - 0.08 (ng/mL)   Comment 3           PRO B NATRIURETIC PEPTIDE      Component Value Range   Pro B Natriuretic peptide (BNP) 1260.0 (*) 0 - 125 (pg/mL)   Dg Chest 2 View  11/13/2011  *RADIOLOGY REPORT*  Clinical Data: Chest pain.  Short of breath.  Previous CABG.  CHEST - 2 VIEW  Comparison: 09/30/2011 and previous  Findings: There has been previous median sternotomy and coronary artery bypass grafting.  The heart is enlarged.  There is venous hypertension with interstitial and early alveolar edema.  There is pleural fluid on the left with basilar volume loss.  There is a small amount of pleural fluid in the posterior costophrenic angle on the right.  Findings are consistent with worsened congestive heart failure.  IMPRESSION: Was suggest heart failure.  Venous hypertension.  Interstitial and alveolar edema.  Increasing effusions left more than right with basilar volume loss.  Original Report Authenticated By: Thomasenia Sales, M.D.    Patient seen and re-evaluated. Resting comfortably. VSS stable. NAD. Patient notified of testing results. Stated agreement and understanding. Patient stated understanding to treatment plan and diagnosis.   No diagnosis found.  7:20 PM spoke to [facility], who will come see the patient in the ED for further evaluation. Patient notified.     Date: 11/13/2011  Rate: 94  Rhythm: normal sinus rhythm  QRS Axis: right  Intervals: normal  ST/T Wave abnormalities: nonspecific ST/T changes  Conduction Disutrbances:none  Narrative Interpretation:   Old EKG Reviewed: unchanged    MDM  Chest pain SOB    Demetrius Charity, PA 11/13/11 2039

## 2011-11-13 NOTE — ED Notes (Signed)
Pt reports intermittent bilateral chest pain x several days. Pt reports today she started to feel sob. Pt denies hx of asthma or copd. Pt with cabg x 4 in October. Reports cough. Denies fever.

## 2011-11-13 NOTE — ED Notes (Signed)
RN introduced self to pt; no chest pain; only complaint is carpal tunnel in wrist which pt reports PCP manages; VSS; no signs of distress.

## 2011-11-13 NOTE — ED Notes (Signed)
Report given to floor. Cardiology currently at bedside. Will transport pt after being seen by MD.

## 2011-11-13 NOTE — H&P (Addendum)
History and Physical  Patient ID: Christine Knight MRN: [id number], SOB: 07/08/1951 61 y.o. Date of Encounter: 11/13/2011, 7:34 PM  Primary Physician: Sanda Linger, MD, MD Primary Cardiologist: Dr. Myrtis Ser  Chief Complaint: chest pain/SOB  HPI:  61y/o F with hx CAD s/p recent CABG 08/13/2011 with ICM EF 25%, DM, & post-op L pleural effusion requiring thoracentesis 09/02/11. After thoracentesis, her pleural effusion recurred requiring maintenance lasix for which she takes 40mg  bid. She presented to [facility] with complaints of CP. For the last 2-3 days has had intermittent chest pain, R&L sided, non-exertional described as sharp & burning. It is somewhat positional and is not similar to her prior angina. She also has noted marked DOE with even low-activity ADL's including walking to the bathroom. She endorses 2-3lb weight gain, +LEE and PND. She has been out of her lasix for the past 3 days. CXR showed CHF with venous HTN with interstitial and early alveloar edema, with pleural fluid on the left with basilar volume loss and small amount of pleural fluid in the posterior costophrenic angle. POC troponin is neg, BMET/CBC unremarkable except for glucose 156, & BNP is up to 1260.  Past Medical History  Diagnosis Date  . Obesity, morbid   . CHF (congestive heart failure)   . History of tobacco abuse   . Myocardial infarction 08/10/11    non-STEMI  . Hyperlipidemia 08/10/11    newly diagnosed  . Diabetes mellitus type 2 in obese 08/10/11/    discovered this admission  . CAD (coronary artery disease) 08/30/2011  . Ischemic cardiomyopathy 08/13/2011    EF 20%  . Hx of CABG     [place], 2012     Surgical History:  Past Surgical History  Procedure Date  . Cholecystectomy   . Coronary artery bypass graft 08/13/11    CABG x4 with LIMA to LAD, SVG to Diag, SVG to OM, SVG to PDA, EVH via both thighs     Home Meds: Medication Sig  aspirin 325 MG EC tablet Take 325 mg by mouth daily.    carvedilol (COREG)  6.25 MG tablet Take 3.125 mg by mouth 2 (two) times daily with a meal.   furosemide (LASIX) 40 MG tablet Take 40 mg by mouth 2 (two) times daily.    glipiZIDE (GLUCOTROL) 10 MG tablet Take 5 mg by mouth 2 (two) times daily before a meal.   oxycodone (OXY-IR) 5 MG capsule Take 5 mg by mouth every 4 (four) hours as needed. For pain   potassium chloride SA (K-DUR,KLOR-CON) 20 MEQ tablet Take 20 mEq by mouth daily.    rosuvastatin (CRESTOR) 20 MG tablet Take 20 mg by mouth daily.   It is unclear why she is not on an ACEI.  Allergies: No Known Allergies  History   Social History  . Marital Status: Divorced    Spouse Name: N/A    Number of Children: N/A  . Years of Education: N/A   Occupational History  . Not on file.   Social History Main Topics  . Smoking status: Former Smoker    Types: Cigarettes    Quit date: 11/11/1978  . Smokeless tobacco: Never Used  . Alcohol Use: No  . Drug Use: No  . Sexually Active: Not Currently    Birth Control/ Protection: Post-menopausal   Other Topics Concern  . Not on file   Social History Narrative  . No narrative on file     Family History  Problem Relation Age of Onset  .  Heart disease Father   . Cancer Neg Hx   . Diabetes Neg Hx   . Hypertension Neg Hx   . Kidney disease Neg Hx     Review of Systems: General: negative for chills, fever, night sweats  Cardiovascular: No palpitations. See above otherwise Dermatological: negative for rash Respiratory: negative for cough or wheezing Urologic: negative for hematuria Abdominal: negative for nausea, vomiting, diarrhea, bright red blood per rectum, melena, or hematemesis Neurologic: negative for visual changes, syncope, or dizziness All other systems reviewed and are otherwise negative except as noted above.  Labs:   Lab Results  Component Value Date   WBC 5.3 11/13/2011   HGB 14.6 11/13/2011   HCT 43.0 11/13/2011   MCV 88.1 11/13/2011   PLT 270 11/13/2011    Lab 11/13/11 1814  NA 142    K 4.0  CL 108  CO2 --  BUN 14  CREATININE 0.60  CALCIUM --  PROT --  BILITOT --  ALKPHOS --  ALT --  AST --  GLUCOSE 156*   POC troponin negative x 1  Lab Results  Component Value Date   CHOL 139 10/25/2011   HDL 53.80 10/25/2011   LDLCALC 79 10/25/2011   TRIG 32.0 10/25/2011   Radiology/Studies:  1. Chest 2 View 11/13/2011  *RADIOLOGY REPORT*  Clinical Data: Chest pain.  Short of breath.  Previous CABG.  CHEST - 2 VIEW  Comparison: 09/30/2011 and previous  Findings: There has been previous median sternotomy and coronary artery bypass grafting.  The heart is enlarged.  There is venous hypertension with interstitial and early alveolar edema.  There is pleural fluid on the left with basilar volume loss.  There is a small amount of pleural fluid in the posterior costophrenic angle on the right.  Findings are consistent with worsened congestive heart failure.  IMPRESSION: Was suggest heart failure.  Venous hypertension.  Interstitial and alveolar edema.  Increasing effusions left more than right with basilar volume loss.   2. Most recent echo 10/21/11 - Study Conclusions - Left ventricle: LVEF is approximately 25% with inferiror, distal inferoseptal akinesis; hypokinesis elsewhere. The cavity size was mildly dilated. Wall thickness was normal. - Mitral valve: Mild regurgitation. - Left atrium: The atrium was moderately dilated.  EKG: NSR with new LPFB, anterior infarct as seen on prior EKG with some extension of Q waves into V5   Physical Exam: Blood pressure 134/86, pulse 88, temperature 98.5 F (36.9 C), temperature source Oral, resp. rate 18, SpO2 100.00%. General: Well developed, well nourished, in no acute distress. Head: Normocephalic, atraumatic, sclera non-icteric, no xanthomas, nares are without discharge.  Neck: Negative for carotid bruits. ++JVD  Lungs: Tachypneic, bibasilar rales without wheezes or rhonchi. Heart: RRR with distant S1 S2. No murmurs, rubs, or gallops  appreciated. Abdomen: Soft, non-tender, non-distended with normoactive bowel sounds. No hepatomegaly. No rebound/guarding. No obvious abdominal masses. Msk:  Strength and tone appear normal for age. Extremities: No clubbing or cyanosis. Tr pedal edema.  Distal pedal pulses are 2+ and equal bilaterally. Neuro: Alert and oriented X 3. Moves all extremities spontaneously. Psych:  Responds to questions appropriately with a normal affect.  ASSESSMENT AND PLAN:   1. Acute on chronic systolic CHF exacerbation (EF 16%) - dyspnea likely reflects CHF exacerbation.  Sxs have been progressive over the past week, but recent noncompliance with Lasix the past 3 days may have caused more rapid and profound decompensation.   2. Chest pain with hx of CAD s/p recent CABG -  cycle enzymes. Pain is different from prior angina. Continue ASA/BB/statin.  3. DM - continue glipizide, add SSI.  Signed, Ronie Spies PA-C 11/13/2011, 7:34 PM  Cardiology Attending Patient interviewed and examined. Discussed with Dayna Dunn PA-C.  Above note annotated and modified based upon my findings.  Subacute CHF with known severe ischemic cardiomyopathy.  Associated CP of concern, but no acute abnormalities on EKG.  Cardiac markers are pending.  Proceed with diuresis.  Needs better medical regimen to include spironolactone, ACE-I or ARB, diuresis, titration of B-blocker, and consideration for AICD.  Patient has had symptoms recently diagnosed as bilateral carpal tunnel syndrome.  Attention to this issue during hospitalization with orthopedic or hand surgery consult would be appreciated by the patient.  [place] Bing, MD 11/13/2011, 9:36 PM

## 2011-11-13 NOTE — ED Notes (Signed)
Administered Lasix per cardiologist & admission orders. Phoned Irving Burton, 4700 RN and updated her on this. Pt is currently being transported by tele tech to floor.

## 2011-11-13 NOTE — ED Notes (Signed)
Attempted to phone floor report. Receiving RN providing pt care and unable to take report at this time. Informed them that we will bring pt up and give bedside report if receiving RN is unable to return call by 9:30.

## 2011-11-14 DIAGNOSIS — I509 Heart failure, unspecified: Secondary | ICD-10-CM

## 2011-11-14 LAB — BASIC METABOLIC PANEL
Calcium: 9.2 mg/dL (ref 8.4–10.5)
Creatinine, Ser: 0.56 mg/dL (ref 0.50–1.10)
GFR calc Af Amer: >90 mL/min (ref >90)
Sodium: 141 mEq/L (ref 135–145)

## 2011-11-14 LAB — CARDIAC PANEL(CRET KIN+CKTOT+MB+TROPI)
CK, MB: 2.6 ng/mL (ref 0.3–4.0)
Total CK: 126 U/L (ref 7–177)
Total CK: 135 U/L (ref 7–177)
Troponin I: <0.3 ng/mL (ref <0.30)

## 2011-11-14 LAB — GLUCOSE, CAPILLARY
Glucose-Capillary: 88 mg/dL (ref 70–99)
Glucose-Capillary: 152 mg/dL — ABNORMAL HIGH (ref 70–99)
Glucose-Capillary: 65 mg/dL — ABNORMAL LOW (ref 70–99)

## 2011-11-14 MED ORDER — FUROSEMIDE 10 MG/ML IJ SOLN
40.0000 mg | Freq: Two times a day (BID) | INTRAMUSCULAR | Status: DC
  Administered 2011-11-14 – 2011-11-15 (×2): 40 mg via INTRAVENOUS
  Filled 2011-11-14 (×4): qty 4

## 2011-11-14 NOTE — Progress Notes (Signed)
CSW met with pt  And pt's daughter at bedside. CSW met to provide emotional support and also provide education on CHF management. Pt was very knowledgeable about CHF management and was able to identify that her lack of adherence to her medications was the leading cause of her hospitalization. Pt was unable to afford her medications and did not ask for help from her children. Pt's daughter reported that all of her prescriptions have been filled and are at her home currently. Pt reported that this would not be a problem again.  Pt. Reported that she does not want home health but would like  a referral for outpatient rehab. CSW will follow up with this referral. Pt also reported symptoms of depression and scored a 5 on the PHQ9 depression screen. Pt was tearful and reported feelings of hopelessness and frustration. CSW provided brief goal directed  Therapy  to help the patient identify activities that canl help with her symptoms of depression. The patient vocalized that staying in her home all day increased her symptoms of depression. The CSW helped the patient to identify ways to get out of her house and get moving around. The CSW also provided the patient with referrals to the [facility] and other Mental health agencies to receive counseling for her depression. Pt has good support from her children and the goals defined are achievable. Clinical Social worker completed a comprehensive psychosocial assessment which can be found in the shadow chart. Clinical Social Worker will sign off for now as social work intervention is no longer needed. Please consult [place] again if new need arises.

## 2011-11-14 NOTE — ED Provider Notes (Signed)
Medical screening examination/treatment/procedure(s) were performed by non-physician practitioner and as supervising physician I was immediately available for consultation/collaboration.  Arlet Marter, MD 11/14/11 1550 

## 2011-11-14 NOTE — Progress Notes (Signed)
UR Completed.  Cannie Muckle Norris 11/14/2011 [phone]  

## 2011-11-14 NOTE — Progress Notes (Signed)
Clinical Social Worker was consulted due to the concerns the patient had regarding financial assistance. CSW provided patient with resources from [facility] DSS and [employer] regarding their emergency assistance program. CSW also provided the patient with information from [employer] and their [employer] Counseling Service that provide workshops regarding money management and financial planning. Patient was appreciative of these resources and stated "I will read over this information today." CSW alerted CM to assist the patient with her medication concerns. CSW will sign off for now, as social work intervention is no longer needed. Please consult [place] again should new needs arise.   Rozetta Nunnery MSW, [employer] [phone]

## 2011-11-14 NOTE — Progress Notes (Signed)
Physical Therapy Evaluation Patient Details Name: Christine Knight MRN: [id number] DOB: 1951-09-30 Today's Date: 11/14/2011  Problem List:  Patient Active Problem List  Diagnoses  . Obesity, morbid  . CHF (congestive heart failure)  . History of tobacco abuse  . Myocardial infarction  . Hyperlipidemia  . Diabetes mellitus type 2 in obese  . CAD (coronary artery disease)  . S/P CABG x 4  . Pleural effusion, left  . Unspecified pleural effusion  . Hand pain  . Anemia  . Myositis  . CTS (carpal tunnel syndrome)    Past Medical History:  Past Medical History  Diagnosis Date  . Obesity, morbid   . CHF (congestive heart failure)   . History of tobacco abuse   . Hyperlipidemia 08/10/11  . Diabetes mellitus type 2 in obese 08/10/11  . CAD (coronary artery disease) 08/2011    NSTEMI with subsequent CABG 08/13/2011 with LIMA-LAD, SVG-diagonal, SVG-OM, SVG-PDA  . Ischemic cardiomyopathy 08/13/2011    EF 20% in 08/2011, still 25% 10/21/2011  . Pleural effusion     Requiring L thoracentesis 09/02/11  . Mitral regurgitation     Mild by echo 10/21/11  . Carpal tunnel syndrome on both sides     "since OHS 08/2011"  . Angina   . Myocardial infarction 08/10/2011  . Shortness of breath on exertion 11/13/11    "this is my first time"  . Hypertension    Past Surgical History:  Past Surgical History  Procedure Date  . Coronary artery bypass graft 08/13/11    CABG x4 with LIMA to LAD, SVG to Diag, SVG to OM, SVG to PDA, EVH via both thighs  . Gallstones removed laparoscopy ~ 2000    PT Assessment/Plan/Recommendation PT Assessment Clinical Impression Statement: Pt is a 61 y/o female with recent CABG and admitted for SOB and chest pain.  Per MD chest pain muscoskeletal at orgin.  Pt moving independently however continues to be slightly SOB with increase ambulation distance.  Pt needed 2 standing rest breaks to walk 300 feet.  Pt is moving well and has no further acute PT needs.  However  recommend pt to be followed by the mobility team in hospital for daily ambulation.  Pt encouraged to ambulate 2-3 times/day and educated on energy conservation with handout given. PT Recommendation/Assessment: Patent does not need any further PT services No Skilled PT: All education completed;Patient is independent with all acitivity/mobility PT Recommendation Follow Up Recommendations: None Equipment Recommended: None recommended by PT PT Goals     PT Evaluation Precautions/Restrictions    Prior Functioning  Home Living Lives With: Spouse Receives Help From: Family Type of Home: House Home Layout: One level Home Access: Stairs to enter Entrance Stairs-Rails: None Entrance Stairs-Number of Steps: 2 Bathroom Shower/Tub: Associate Professor: Yes How Accessible: Accessible via walker Home Adaptive Equipment: None Prior Function Level of Independence: Independent with basic ADLs;Independent with gait;Independent with transfers Able to Take Stairs?: Yes Driving: Yes Vocation: Full time employment Cognition Cognition Arousal/Alertness: Awake/alert Overall Cognitive Status: Appears within functional limits for tasks assessed Sensation/Coordination Sensation Light Touch: Appears Intact Extremity Assessment RUE Assessment RUE Assessment: Within Functional Limits LUE Assessment LUE Assessment: Within Functional Limits RLE Assessment RLE Assessment: Within Functional Limits LLE Assessment LLE Assessment: Within Functional Limits Mobility (including Balance) Bed Mobility Bed Mobility: Yes Supine to Sit: 7: Independent Transfers Transfers: Yes Sit to Stand: 7: Independent Stand to Sit: 7: Independent Ambulation/Gait Ambulation/Gait: Yes Ambulation/Gait Assistance: 5: Supervision Ambulation/Gait Assistance Details (  indicate cue type and reason): Supervision due to pt with SOB at end of session. Ambulation Distance (Feet): 300  Feet Assistive device: None Gait Pattern: Within Functional Limits Stairs: Yes Stairs Assistance: 6: Modified independent (Device/Increase time) Stair Management Technique: One rail Right Number of Stairs: 2   Posture/Postural Control Posture/Postural Control: No significant limitations Exercise    End of Session PT - End of Session Equipment Utilized During Treatment: Gait belt Activity Tolerance: Patient tolerated treatment well Patient left: in bed;with call bell in reach (sitting EOB) Nurse Communication: Mobility status for transfers;Mobility status for ambulation General Behavior During Session: [facility] for tasks performed Cognition: [facility] for tasks performed  Dominyk Law 11/14/2011, 11:30 AM [phone]

## 2011-11-14 NOTE — Progress Notes (Signed)
@   Subjective:  Chest pain with lying in certain positions; dyspnea improving.   Objective:  Filed Vitals:   11/13/11 2030 11/13/11 2145 11/13/11 2154 11/14/11 0503  BP: 130/90  145/96 97/66  Pulse: 87  84 75  Temp:   97.3 F (36.3 C) 97.9 F (36.6 C)  TempSrc:   Oral Oral  Resp: 25  19 19   Height:   5\' 6"  (1.676 m)   Weight:   251 lb 11.2 oz (114.17 kg) 248 lb 3.2 oz (112.583 kg)  SpO2: 99% 100% 100% 98%    Intake/Output from previous day:  Intake/Output Summary (Last 24 hours) at 11/14/11 0741 Last data filed at 11/14/11 0600  Gross per 24 hour  Intake    248 ml  Output    500 ml  Net   -252 ml    Physical Exam: Physical exam: Well-developed well-nourished in no acute distress.  Skin is warm and dry.  HEENT is normal.  Neck is supple. No thyromegaly.  Chest is diminished BS bases Cardiovascular exam is regular rate and rhythm.  Abdominal exam nontender or distended. No masses palpated. Extremities show trace edema. neuro grossly intact    Lab Results: Basic Metabolic Panel:  Basename 11/14/11 0455 11/13/11 2225 11/13/11 1814  NA 141 -- 142  K 3.9 -- 4.0  CL 106 -- 108  CO2 27 -- --  GLUCOSE 169* -- 156*  BUN 13 -- 14  CREATININE 0.56 0.59 --  CALCIUM 9.2 -- --  MG -- -- --  PHOS -- -- --   CBC:  Basename 11/13/11 2225 11/13/11 1814 11/13/11 1755  WBC 5.2 -- 5.3  NEUTROABS -- -- 3.2  HGB 13.6 14.6 --  HCT 41.4 43.0 --  MCV 87.9 -- 88.1  PLT 290 -- 270   Cardiac Enzymes:  Basename 11/13/11 2216  CKTOTAL 148  CKMB 2.8  CKMBINDEX --  TROPONINI <0.30     Assessment/Plan:  1) chest pain - symptoms atypical and may be muskuloskeletal; enzymes negative; no further ischemia evaluation 2) Acute on chronic systolic CHF; improving; continue present meds but decrease lasix to 40 bid. Titrate other meds as outpatient. I have stressed compliance. 3) ICM; titrate meds; repeat echo as outpatient; if EF < 35 ICD 4) HTN continue present BP  meds.  Olga Millers 11/14/2011, 7:41 AM

## 2011-11-15 ENCOUNTER — Other Ambulatory Visit: Payer: Self-pay | Admitting: Physician Assistant

## 2011-11-15 DIAGNOSIS — I5023 Acute on chronic systolic (congestive) heart failure: Principal | ICD-10-CM

## 2011-11-15 LAB — BASIC METABOLIC PANEL
BUN: 15 mg/dL (ref 6–23)
Creatinine, Ser: 0.64 mg/dL (ref 0.50–1.10)
GFR calc Af Amer: >90 mL/min (ref >90)
GFR calc non Af Amer: >90 mL/min (ref >90)
Potassium: 4.5 mEq/L (ref 3.5–5.1)

## 2011-11-15 LAB — GLUCOSE, CAPILLARY

## 2011-11-15 MED ORDER — LISINOPRIL 5 MG PO TABS: 5.0000 mg | ORAL_TABLET | Freq: Every day | ORAL | Status: DC

## 2011-11-15 MED ORDER — CARVEDILOL 6.25 MG PO TABS: 6.2500 mg | ORAL_TABLET | Freq: Two times a day (BID) | ORAL | Status: DC

## 2011-11-15 NOTE — Progress Notes (Signed)
Patient ID: Christine Knight, female   DOB: Oct 13, 1951, 61 y.o.   MRN: [id number] A complete rounding note Has already been entered into the chart. I have seen the patient and reviewed all the data. She is stable. She is improved. She is to be discharged home on the same medicines that she was on coming into the hospital. Further medication titration will occur as an outpatient. She can be seen by me for Tereso Newcomer in the office over the next 2 weeks.

## 2011-11-15 NOTE — Progress Notes (Addendum)
IV d/c'd. Tele d/c'd. Pt d/c'd to home. Home meds and d/c instructions discussed with pt. Pt denies any questions or concerns at this time. Pt leaving unit via wheelchair and appears in no acute distress. Nino Glow RN

## 2011-11-15 NOTE — Discharge Summary (Signed)
Discharge Summary   Patient ID: Christine Knight MRN: [id number], DOB/AGE: 61-Aug-1952 61 y.o.  Primary MD: Sanda Linger, MD, MD Primary Cardiologist: Luis Abed, MD  Admit date: 11/13/2011 D/C date:     11/15/2011      Primary Discharge Diagnoses:  1. Acute on Chronic Systolic Heart Failure  - Diuresed with IV lasix, discharged on oral Lasix 40mg  BID  - Coreg increased to 6.25mg  BID, Lisinopril 5mg  initiated  2. Chest pain, atypical, likely musculoskeletal s/p CABG  - Negative cardiac enzymes x3  - No acute EKG changes  Secondary Discharge Diagnoses:  1. Ischemic Cardiomyopathy  - LVEF is approximately 25% with inferior,distal inferoseptal akinesis by echo 10/21/11.  - Recommend an outpatient echocardiogram with consideration of ICD placement if EF remains < 35% with medical therapy 2. CAD s/p CABG on 08/13/2011 3. Hyperlipidemia - LDL 79 (10/25/11).  4. Diabetes Mellitus: A1C 6.4 (10/25/11).   Allergies No Known Allergies  Diagnostic Studies/Procedures:  None  History of Present Illness: 61 y.o. female w/ PMHx significant for CAD s/p recent CABG 08/13/2011 with ICM EF 25%, HLD, DM, and obesity who presented to [facility] on 11/13/11 with complaints of shortness of breath.  She developed a post-op L pleural effusion requiring thoracentesis on 09/02/11. After thoracentesis, her pleural effusion recurred requiring maintenance lasix for which she takes 40mg  bid. She presented to [facility] with complaints of CP. For 2-3 days prior to presentation she had intermittent chest pain, R&L sided, non-exertional described as sharp & burning. It was somewhat positional and was not similar to her prior angina. She also noted marked DOE with even low-activity ADL's including walking to the bathroom. She endorsed 2-3lb weight gain, lower extremity edema and PND. She had been out of her lasix for the 3 days.  Hospital Course: In the ED her CXR showed CHF with venous HTN with interstitial and  early alveloar edema, with pleural fluid on the left with basilar volume loss and small amount of pleural fluid in the posterior costophrenic angle. POC troponin was negative, BMET/CBC were unremarkable except for glucose 156, & her BNP was up to 1260. She was admitted for further treatment and evaluation.  She was diuresed with IV Lasix with improvement in her shortness of breath and lower extremity edema. Lisinopril and Spironolactone were initiated and her Coreg was increased. She was able to ambulate without complaints of chest pain and with mild DOE, but felt she was almost back to baseline on day of discharge. Her discharge weight was 244lbs (111kg), down from 251lbs (114kg) on admission. She continued to have intermittent sharp right-sided chest pains, but her cardiac enzymes remained negative and her EKG was without acute changes and it was felt her chest pain was musculoskeletal.  She was discharged in stable condition to home with plans to follow up with Dr. Myrtis Ser. Lisinopril, Coreg and Lasix were continued upon discharge and Spironolactone was discontinued with further titration of medications to be done as an outpatient. It is recommended she continue with medical therapy and have a repeat echocardiogram to assess her need for possible ICD placement if EF remains < 35%.    Discharge Vitals: Blood pressure 101/66, pulse 71, temperature 98 F (36.7 C), temperature source Oral, resp. rate 24, height 5\' 6"  (1.676 m), weight 244 lb 11.4 oz (111 kg), SpO2 96.00%.  Labs: Lab Results  Component Value Date   WBC 5.2 11/13/2011   HGB 13.6 11/13/2011   HCT 41.4 11/13/2011   MCV 87.9  11/13/2011   PLT 290 11/13/2011    Lab 11/15/11 0550  NA 137  K 4.5  CL 103  CO2 26  BUN 15  CREATININE 0.64  CALCIUM 9.2  GLUCOSE 102*   Basename 11/14/11 1606 11/14/11 0915 11/13/11 2216  CKTOTAL 135 126 148  CKMB 2.6 2.7 2.8  TROPONINI <0.30 <0.30 <0.30   11/13/2011 18:39 Pro B Natriuretic peptide (BNP)  1260.0  (H)    Discharge Medications   Current Discharge Medication List    START taking these medications   Details  lisinopril (PRINIVIL,ZESTRIL) 5 MG tablet Take 1 tablet (5 mg total) by mouth daily. Qty: 30 tablet, Refills: 6    CONTINUE these medications which have CHANGED   Details  carvedilol (COREG) 6.25 MG tablet Take 1 tablet (6.25 mg total) by mouth 2 (two) times daily with a meal. Qty: 60 tablet, Refills: 6    CONTINUE these medications which have NOT CHANGED   Details  aspirin 325 MG EC tablet Take 325 mg by mouth daily.      furosemide (LASIX) 40 MG tablet Take 40 mg by mouth 2 (two) times daily.      glipiZIDE (GLUCOTROL) 10 MG tablet Take 5 mg by mouth 2 (two) times daily before a meal.     oxycodone (OXY-IR) 5 MG capsule Take 5 mg by mouth every 4 (four) hours as needed. For pain     potassium chloride SA (K-DUR,KLOR-CON) 20 MEQ tablet Take 20 mEq by mouth daily.      rosuvastatin (CRESTOR) 20 MG tablet Take 20 mg by mouth daily.       Disposition   Discharge Orders    Future Appointments: Provider: Department: Dept Phone: Center:   11/20/2011 11:00 AM [facility] [phone] [facility]   11/28/2011 11:00 AM Beatrice Lecher, PA [facility] [phone] [facility]   12/04/2011 11:30 AM Erick Blinks, MD [facility] [phone] [facility]   12/09/2011 1:00 PM Purcell Nails, MD [facility] [phone] [facility]   12/16/2011 2:45 PM Luis Abed, MD [facility] [phone] [facility]     Future Orders Please Complete By Expires   Diet - low sodium heart healthy      Increase activity slowly      Follow-up Information    Follow up with Sanda Linger, MD. Make an appointment in 2 weeks. [facility] follow up)       Follow up with Willa Rough, MD on 11/28/2011. (11:00)    Contact information:   [facility] [address], [address] [place] [address] [phone]     Outstanding  Labs/Studies: Outpatient Echocardiogram  Duration of Discharge Encounter: Greater than 30 minutes including physician and PA time.  Signed, Mitcheal Sweetin PA-C 11/15/2011, 3:03 PM

## 2011-11-15 NOTE — Progress Notes (Signed)
Cardiology Progress Note Patient Name: Christine Knight Date of Encounter: 11/15/2011, 1:31 PM     Subjective  No overnight events. Dyspnea improving, almost back to baseline. Able to ambulate without c/o chest pain, but still with DOE. Reports intermittent sharp right sided chest pains   Objective   Telemetry: sinus rhythm, 60s-80s, multiple PVCs  Medications: . aspirin  81 mg Oral Daily  . carvedilol  6.25 mg Oral BID WC  . furosemide  40 mg Intravenous BID  . glipiZIDE  5 mg Oral BID AC  . heparin  5,000 Units Subcutaneous Q8H  . insulin aspart  0-9 Units Subcutaneous TID WC  . lisinopril  5 mg Oral Daily  . potassium chloride SA  20 mEq Oral Daily  . rosuvastatin  20 mg Oral QHS  . sodium chloride  3 mL Intravenous Q12H  . spironolactone  25 mg Oral Daily   Physical Exam: Temp:  [97.6 F (36.4 C)-98.2 F (36.8 C)] 98 F (36.7 C) (01/04 0427) Pulse Rate:  [68-83] 71  (01/04 0900) Resp:  [18-24] 24  (01/04 0427) BP: (101-129)/(64-82) 101/66 mmHg (01/04 0900) SpO2:  [95 %-100 %] 96 % (01/04 0717) Weight:  [244 lb 11.4 oz (111 kg)] 244 lb 11.4 oz (111 kg) (01/04 0427)  General: Pleasant, overweight black female, in no acute distress. Head: Normocephalic, atraumatic, sclera non-icteric, nares are without discharge.  Neck: Supple. Negative for carotid bruits or JVD Lungs: Distant breath sounds. Scattered wheezes and fine bibasilar rales. No rhonchi. Breathing is unlabored. Heart: Distant heart sounds. RRR S1 S2 without murmurs, rubs, or gallops.  Abdomen: Soft, non-tender, non-distended with normoactive bowel sounds. No rebound/guarding. No obvious abdominal masses. Msk:  Strength and tone appear normal for age. Extremities: Trace bilat LE edema. No clubbing or cyanosis. Distal pedal pulses are 2+ and equal bilaterally. Neuro: Alert and oriented X 3. Moves all extremities spontaneously. Psych:  Responds to questions appropriately with a normal affect.  Intake/Output  Summary (Last 24 hours) at 11/15/11 1331 Last data filed at 11/15/11 1021  Gross per 24 hour  Intake   1078 ml  Output    500 ml  Net    578 ml   Labs:  [facility] 11/15/11 0550 11/14/11 0455  NA 137 141  K 4.5 3.9  CL 103 106  CO2 26 27  GLUCOSE 102* 169*  BUN 15 13  CREATININE 0.64 0.56  CALCIUM 9.2 9.2   Basename 11/13/11 2225 11/13/11 1814 11/13/11 1755  WBC 5.2 -- 5.3  NEUTROABS -- -- 3.2  HGB 13.6 14.6 --  HCT 41.4 43.0 --  MCV 87.9 -- 88.1  PLT 290 -- 270   Basename 11/14/11 1606 11/14/11 0915 11/13/11 2216  CKTOTAL 135 126 148  CKMB 2.6 2.7 2.8  TROPONINI <0.30 <0.30 <0.30     11/13/2011 18:39  Pro B Natriuretic peptide (BNP) 1260.0 (H)    Radiology/Studies:  Dg Chest 2 View 11/13/2011  Findings: There has been previous median sternotomy and coronary artery bypass grafting.  The heart is enlarged.  There is venous hypertension with interstitial and early alveolar edema.  There is pleural fluid on the left with basilar volume loss.  There is a small amount of pleural fluid in the posterior costophrenic angle on the right.  Findings are consistent with worsened congestive heart failure.  IMPRESSION: Was suggest heart failure.  Venous hypertension.  Interstitial and alveolar edema.  Increasing effusions left more than right with basilar volume loss.  Assessment and Plan  61 y.o. female w/ PMHx significant for CAD s/p recent CABG 08/13/2011 with ICM EF 25%, HLD, DM, and obesity who presented to [facility] on 11/13/11 with complaints of shortness of breath.  1. Acute on Chronic Systolic Heart Failure: Her dyspnea and lower extremity edema have improved with IV diuresis. She is receiving Lasix 40mg  IV BID. She is net positive on her I&Os but her weight is 244lbs (111kg) this am, down from 251lbs (114kg) on admission. She has been able to ambulate in the halls with mild DOE and feels like she is almost back to baseline. Social work discussed availability of medication  assistance with her and she communicates understanding of importance of medication compliance. Consider transitioning to oral lasix with possible dc later today. Cont Coreg, Lisinopril and Spironolactone with up titration as tolerated and close monitoring of K+.   2. Ischemic Cardiomyopathy: LVEF is approximately 25% with inferior,distal inferoseptal akinesis by echo 10/21/11. Titrate meds as above as tolerated. Repeat echo as an outpatient and consider ICD if EF remains < 35%  3. Chest Pain: Likely musculoskeletal with negative cardiac enzymes x3 and no acute changes on EKG. No further ischemic evaluation.  4. Hyperlipidemia: LDL 79 (10/25/11). Cont statin.  5. Diabetes Mellitus: A1C 6.4 (10/25/11). Cont home meds.  Signed, Jaynie Hitch PA-C

## 2011-11-19 ENCOUNTER — Other Ambulatory Visit: Payer: Self-pay | Admitting: Internal Medicine

## 2011-11-20 ENCOUNTER — Ambulatory Visit (AMBULATORY_SURGERY_CENTER): Payer: Managed Care, Other (non HMO) | Admitting: *Deleted

## 2011-11-20 VITALS — Ht 67.0 in | Wt 242.9 lb

## 2011-11-20 DIAGNOSIS — Z1211 Encounter for screening for malignant neoplasm of colon: Secondary | ICD-10-CM

## 2011-11-20 MED ORDER — MOVIPREP 100 G PO SOLR: ORAL | Status: DC

## 2011-11-25 ENCOUNTER — Ambulatory Visit (INDEPENDENT_AMBULATORY_CARE_PROVIDER_SITE_OTHER): Payer: Managed Care, Other (non HMO) | Admitting: Internal Medicine

## 2011-11-25 ENCOUNTER — Encounter: Payer: Self-pay | Admitting: Internal Medicine

## 2011-11-25 DIAGNOSIS — M79609 Pain in unspecified limb: Secondary | ICD-10-CM

## 2011-11-25 DIAGNOSIS — G5600 Carpal tunnel syndrome, unspecified upper limb: Secondary | ICD-10-CM

## 2011-11-25 DIAGNOSIS — M79643 Pain in unspecified hand: Secondary | ICD-10-CM

## 2011-11-25 DIAGNOSIS — I509 Heart failure, unspecified: Secondary | ICD-10-CM

## 2011-11-25 MED ORDER — OXYCODONE HCL 5 MG PO CAPS: 5.0000 mg | ORAL_CAPSULE | ORAL | Status: DC | PRN

## 2011-11-25 NOTE — Assessment & Plan Note (Signed)
Continue oxy-ir as needed

## 2011-11-25 NOTE — Assessment & Plan Note (Signed)
Her fluid status is normal today

## 2011-11-25 NOTE — Assessment & Plan Note (Signed)
Start OT, get a NCS/EMG done, continue oxy-ir as needed

## 2011-11-25 NOTE — Progress Notes (Signed)
Subjective:    Patient ID: Christine Knight, female    DOB: [birth year], 61 y.o.   MRN: [id number]  HPI She returns for f/up on the persistent bilateral hand and wrist pain, she tells me that she takes Oxy-IR for the more severe episodes of pain and it helps. She can't take nsaids due to CHF. She reports that the pain radiates up and down from her wrists and she has intermittent tingling in her hands and fingers but no weakness or numbness.   Review of Systems  Constitutional: Negative for fever, chills, diaphoresis, activity change, appetite change, fatigue and unexpected weight change.  HENT: Negative.   Eyes: Negative.   Respiratory: Negative for cough, chest tightness, shortness of breath, wheezing and stridor.   Cardiovascular: Negative for chest pain, palpitations and leg swelling.  Gastrointestinal: Negative for nausea, vomiting, diarrhea and anal bleeding.  Genitourinary: Negative.   Musculoskeletal: Positive for arthralgias. Negative for myalgias, back pain, joint swelling and gait problem.  Skin: Negative.   Neurological: Negative for dizziness, tremors, facial asymmetry, weakness and numbness.  Hematological: Negative for adenopathy. Does not bruise/bleed easily.  Psychiatric/Behavioral: Negative.        Objective:   Physical Exam  Vitals reviewed. Constitutional: She is oriented to person, place, and time. She appears well-developed and well-nourished. No distress.  HENT:  Head: Normocephalic and atraumatic.  Mouth/Throat: Oropharynx is clear and moist. No oropharyngeal exudate.  Eyes: Conjunctivae are normal. Right eye exhibits no discharge. Left eye exhibits no discharge. No scleral icterus.  Neck: Normal range of motion. Neck supple. No JVD present. No tracheal deviation present. No thyromegaly present.  Cardiovascular: Normal rate, regular rhythm, normal heart sounds and intact distal pulses.  Exam reveals no gallop and no friction rub.   No murmur  heard. Pulmonary/Chest: Effort normal and breath sounds normal. No stridor. No respiratory distress. She has no wheezes. She has no rales. She exhibits no tenderness.  Abdominal: Soft. Bowel sounds are normal. She exhibits no distension and no mass. There is no tenderness. There is no rebound and no guarding.  Musculoskeletal: Normal range of motion. She exhibits no edema and no tenderness.       Right wrist: Normal. She exhibits normal range of motion, no tenderness, no bony tenderness, no swelling, no effusion, no crepitus, no deformity and no laceration.       Left wrist: Normal. She exhibits normal range of motion, no tenderness, no bony tenderness, no swelling, no effusion, no crepitus, no deformity and no laceration.  Lymphadenopathy:    She has no cervical adenopathy.  Neurological: She is alert and oriented to person, place, and time. She has normal strength. She displays no atrophy, no tremor and normal reflexes. No cranial nerve deficit or sensory deficit. She exhibits normal muscle tone. She displays a negative Romberg sign. She displays no seizure activity. Coordination and gait normal. She displays no Babinski's sign on the right side. She displays no Babinski's sign on the left side.  Reflex Scores:      Tricep reflexes are 1+ on the right side and 1+ on the left side.      Bicep reflexes are 1+ on the right side and 1+ on the left side.      Brachioradialis reflexes are 1+ on the right side and 1+ on the left side.      Patellar reflexes are 1+ on the right side and 1+ on the left side.      Achilles reflexes are 1+ on the  right side and 1+ on the left side. Skin: Skin is warm and dry. No rash noted. She is not diaphoretic. No erythema. No pallor.  Psychiatric: She has a normal mood and affect. Her behavior is normal. Judgment and thought content normal.      Lab Results  Component Value Date   WBC 5.2 11/13/2011   HGB 13.6 11/13/2011   HCT 41.4 11/13/2011   PLT 290 11/13/2011    GLUCOSE 102* 11/15/2011   CHOL 139 10/25/2011   TRIG 32.0 10/25/2011   HDL 53.80 10/25/2011   LDLCALC 79 10/25/2011   ALT 28 10/25/2011   AST 28 10/25/2011   NA 137 11/15/2011   K 4.5 11/15/2011   CL 103 11/15/2011   CREATININE 0.64 11/15/2011   BUN 15 11/15/2011   CO2 26 11/15/2011   TSH 1.37 10/25/2011   INR 1.09 08/21/2011   HGBA1C 6.4 10/25/2011      Assessment & Plan:

## 2011-11-25 NOTE — Patient Instructions (Signed)
Carpal Tunnel Syndrome The carpal tunnel is a narrow hollow area in the wrist. It is formed by the wrist bones and ligaments. Nerves, blood vessels, and tendons (cord like structures which attach muscle to bone) on the palm side (the side of your hand in the direction your fingers bend) of your hand pass through the carpal tunnel. Repeated wrist motion or certain diseases may cause swelling within the tunnel. (That is why these are called repetitive trauma (damage caused by over use) disorders. It is also a common problem in late pregnancy.) This swelling pinches the main nerve in the wrist (median nerve) and causes the painful condition called carpal tunnel syndrome. A feeling of "pins and needles" may be noticed in the fingers or hand; however, the entire arm may ache from this condition. Carpal tunnel syndrome may clear up by itself. Cortisone injections may help. Sometimes, an operation may be needed to free the pinched nerve. An electromyogram (a type of test) may be needed to confirm this diagnosis (learning what is wrong). This is a test which measures nerve conduction. The nerve conduction is usually slowed in a carpal tunnel syndrome. HOME CARE INSTRUCTIONS   If your caregiver prescribed medication to help reduce swelling, take as directed.   If you were given a splint to keep your wrist from bending, use it as instructed. It is important to wear the splint at night. Use the splint for as long as you have pain or numbness in your hand, arm or wrist. This may take 1 to 2 months.   If you have pain at night, it may help to rub or shake your hand, or elevate your hand above the level of your heart (the center of your chest).   It is important to give your wrist a rest by stopping the activities that are causing the problem. If your symptoms (problems) are work-related, you may need to talk to your employer about changing to a job that does not require using your wrist.   Only take over-the-counter  or prescription medicines for pain, discomfort, or fever as directed by your caregiver.   Following periods of extended use, particularly strenuous use, apply an ice pack wrapped in a towel to the anterior (palm) side of the affected wrist for 20 to 30 minutes. Repeat as needed three to four times per day. This will help reduce the swelling.   Follow all instructions for follow-up with your caregiver. This includes any orthopedic referrals, physical therapy, and rehabilitation. Any delay in obtaining necessary care could result in a delay or failure of your condition to heal.  SEEK IMMEDIATE MEDICAL CARE IF:   You are still having pain and numbness following a week of treatment.   You develop new, unexplained symptoms.   Your current symptoms are getting worse and are not helped or controlled with medications.  MAKE SURE YOU:   Understand these instructions.   Will watch your condition.   Will get help right away if you are not doing well or get worse.  Document Released: 10/25/2000 Document Revised: 07/10/2011 Document Reviewed: 06/01/2008 Exit[employer] Patient Information 2012 [employer]. 

## 2011-11-28 ENCOUNTER — Encounter: Payer: Managed Care, Other (non HMO) | Admitting: Physician Assistant

## 2011-12-04 ENCOUNTER — Encounter: Payer: Self-pay | Admitting: Internal Medicine

## 2011-12-04 ENCOUNTER — Ambulatory Visit (AMBULATORY_SURGERY_CENTER): Payer: Managed Care, Other (non HMO) | Admitting: Internal Medicine

## 2011-12-04 VITALS — BP 121/87 | HR 82 | Temp 99.0°F | Resp 20 | Ht 67.0 in | Wt 242.0 lb

## 2011-12-04 DIAGNOSIS — K5730 Diverticulosis of large intestine without perforation or abscess without bleeding: Secondary | ICD-10-CM

## 2011-12-04 DIAGNOSIS — Z1211 Encounter for screening for malignant neoplasm of colon: Secondary | ICD-10-CM

## 2011-12-04 MED ORDER — SODIUM CHLORIDE 0.9 % IV SOLN: 500.0000 mL | INTRAVENOUS | Status: DC

## 2011-12-04 NOTE — Op Note (Signed)
[facility] [address] [place], [place]  [address]  COLONOSCOPY PROCEDURE REPORT  PATIENT:  Christine, Knight  MR#:  [id number] BIRTHDATE:  April 27, 1951, 60 yrs. old  GENDER:  female ENDOSCOPIST:  Carie Caddy. Armilda Vanderlinden, MD REF. BY:  Etta Grandchild, M.D. PROCEDURE DATE:  12/04/2011 PROCEDURE:  Colonoscopy 19147 ASA CLASS:  Class III INDICATIONS:  Routine Risk Screening (1st colonoscopy) MEDICATIONS:   These medications were titrated to patient response per physician's verbal order, Benadryl 25 mg IV, Fentanyl 75 mcg IV, Versed 8 mg IV  DESCRIPTION OF PROCEDURE:   After the risks benefits and alternatives of the procedure were thoroughly explained, informed consent was obtained.  Digital rectal exam was performed and revealed no rectal masses.   The LB CF-[id number] endoscope was introduced through the anus and advanced to the terminal ileum which was intubated for a short distance, without limitations. The quality of the prep was Moviprep fair.  The instrument was then slowly withdrawn as the colon was fully examined. <<PROCEDUREIMAGES>>  FINDINGS:  Fair prep throughout the colon.  Copious irrigation and lavage performed.  Moderate to severe diverticulosis was found in the left colon.  Small internal hemorrhoids were found. Retroflexed views in the rectum revealed no other findings other than those already described.   The scope was then withdrawn  from the cecum and the procedure completed.  COMPLICATIONS:  None  ENDOSCOPIC IMPRESSION: 1) Fair prep throughout the colon 2) Moderate diverticulosis in the left colon 3) Internal hemorrhoids  RECOMMENDATIONS: 1) High fiber diet. 2) Repeat Colonoscopy in 5 years, based on fair preparation today.  Carie Caddy. Rhea Belton, MD  CC:  Etta Grandchild, MD The Patient  n. eSIGNEDCarie Caddy. Valdez Brannan at 12/04/2011 10:38 AM  [name], [phone]

## 2011-12-04 NOTE — Patient Instructions (Signed)
HANDOUTS GIVEN ON DIVERTICULOSIS, HIGH FIBER DIET, AND HEMORRHOIDS  DISCHARGE INSTRUCTIONS PER BLUE AND NEON GREEN SHEETS. PLEASE KEEP THE GREEN SHEET CLOSE FOR THE NEXT 24 HOURS.  REPEAT COLONOSCOPY IN 5 YEARS. 2018. WE WILL MAIL YOU A LETTER TO REMIND YOU OF THIS SO YOU MAY CALL AND SCHEDULE THIS PROCEDURE.

## 2011-12-04 NOTE — Progress Notes (Signed)
Patient did not experience any of the following events: a burn prior to discharge; a fall within the facility; wrong site/side/patient/procedure/implant event; or a hospital transfer or hospital admission upon discharge from the facility. (G8907) Patient did not have preoperative order for IV antibiotic SSI prophylaxis. (G8918)  

## 2011-12-05 ENCOUNTER — Telehealth: Payer: Self-pay | Admitting: *Deleted

## 2011-12-05 ENCOUNTER — Other Ambulatory Visit: Payer: Self-pay | Admitting: Cardiology

## 2011-12-05 NOTE — Telephone Encounter (Signed)

## 2011-12-05 NOTE — Telephone Encounter (Signed)
Refill   Potassium   Lasix   Crestor  Verified pharmacy [facility]  [address]  [place], [place]

## 2011-12-06 ENCOUNTER — Other Ambulatory Visit: Payer: Self-pay | Admitting: Thoracic Surgery (Cardiothoracic Vascular Surgery)

## 2011-12-06 ENCOUNTER — Other Ambulatory Visit: Payer: Self-pay | Admitting: Internal Medicine

## 2011-12-06 DIAGNOSIS — J90 Pleural effusion, not elsewhere classified: Secondary | ICD-10-CM

## 2011-12-06 DIAGNOSIS — G5600 Carpal tunnel syndrome, unspecified upper limb: Secondary | ICD-10-CM

## 2011-12-09 ENCOUNTER — Ambulatory Visit
Admission: RE | Admit: 2011-12-09 | Discharge: 2011-12-09 | Disposition: A | Discharge status: Home / Self Care | Payer: Managed Care, Other (non HMO) | Source: Ambulatory Visit | Attending: Thoracic Surgery (Cardiothoracic Vascular Surgery) | Admitting: Thoracic Surgery (Cardiothoracic Vascular Surgery)

## 2011-12-09 ENCOUNTER — Encounter: Payer: Self-pay | Admitting: Thoracic Surgery (Cardiothoracic Vascular Surgery)

## 2011-12-09 ENCOUNTER — Ambulatory Visit (INDEPENDENT_AMBULATORY_CARE_PROVIDER_SITE_OTHER): Payer: Managed Care, Other (non HMO) | Admitting: Thoracic Surgery (Cardiothoracic Vascular Surgery)

## 2011-12-09 DIAGNOSIS — J90 Pleural effusion, not elsewhere classified: Secondary | ICD-10-CM

## 2011-12-09 DIAGNOSIS — I2510 Atherosclerotic heart disease of native coronary artery without angina pectoris: Secondary | ICD-10-CM

## 2011-12-09 DIAGNOSIS — Z951 Presence of aortocoronary bypass graft: Secondary | ICD-10-CM

## 2011-12-09 NOTE — Progress Notes (Signed)
[address]            [place] [address]          [phone]     CARDIOTHORACIC SURGERY OFFICE NOTE  Referring Provider is Luis Abed, MD PCP is Sanda Linger, MD, MD   HPI:  Patient returns for follow-up s/p CABG this past October.  Her early postoperative recovery was notable for the development of left pleural effusion requiring thoracentesis. She returns to the office today for further followup. She reports that she's feeling exceptionally well. She still has pain in both pains in this has kept her from going back to work. She apparently has been diagnosed with carpal tunnel syndrome and hopes to get this addressed in the near future. From a cardiovascular standpoint she is doing exceptionally well. She reports that her breathing is better than it was prior to surgery and her excise tolerance is quite good. She is also prepped that her blood sugars have been under good control she continues to work hard to try to lose weight. She states that she feels well and she is very pleased with her progress.   Current Outpatient Prescriptions  Medication Sig Dispense Refill  . ACCU-CHEK SMARTVIEW test strip       . aspirin 325 MG EC tablet Take 325 mg by mouth daily.        . carvedilol (COREG) 3.125 MG tablet TAKE 1 TABLET BY MOUTH TWICE DAILY WITH MEALS  60 tablet  0  . carvedilol (COREG) 6.25 MG tablet Take 1 tablet (6.25 mg total) by mouth 2 (two) times daily with a meal.  60 tablet  6  . CRESTOR 20 MG tablet TAKE 1 TABLET BY MOUTH EVERY NIGHT AT BEDTIME  30 tablet  0  . furosemide (LASIX) 40 MG tablet Take 40 mg by mouth 2 (two) times daily.        Marland Kitchen glipiZIDE (GLUCOTROL) 10 MG tablet Take 5 mg by mouth 2 (two) times daily before a meal.       . glipiZIDE (GLUCOTROL) 5 MG tablet TAKE 1 TABLET BY MOUTH TWICE DAILY WITH MEALS  60 tablet  0  . MOVIPREP 100 G SOLR       . oxycodone (OXY-IR) 5 MG capsule Take 1 capsule (5 mg total) by mouth every 4  (four) hours as needed. For pain  65 capsule  0  . potassium chloride SA (K-DUR,KLOR-CON) 20 MEQ tablet Take 20 mEq by mouth daily.            Physical Exam:   BP 136/94  Pulse 86  Resp 20  Ht 5\' 7"  (1.702 m)  Wt 245 lb (111.131 kg)  BMI 38.37 kg/m2  SpO2 96%  General:  Well appearing  Chest:   Clear to auscultation, sternotomy well healed  CV:   RRR  Abdomen:  soft  Extremities:  warm  Diagnostic Tests:  CHEST - 2 VIEW  12/09/2011 Comparison: 11/13/2011  Findings: Decreased left lower lobe atelectasis is seen and a small  left pleural effusion has nearly completely resolved. There is  minimal scarring seen in the lateral left lung base.  Cardiomegaly and pulmonary venous hypertension again demonstrated,  however there is no evidence of acute infiltrate. Prior CABG again  noted.  IMPRESSION:  1. Decreased left lower lobe atelectasis and near complete  resolution of small left pleural effusion.  2. Persistent cardiomegaly and pulmonary venous hypertension,  without definite pulmonary edema.    Impression:  Patient is doing well over 3 months following surgery. Her pleural effusion has resolved.  Plan:  In the future the patient will call and return to see [place] as needed. All of her questions have been addressed.   [name], MD 12/09/2011 1:11 PM

## 2011-12-10 ENCOUNTER — Encounter: Payer: Self-pay | Admitting: Internal Medicine

## 2011-12-11 LAB — HM COLONOSCOPY: HM Colonoscopy: NORMAL

## 2011-12-12 ENCOUNTER — Telehealth: Payer: Self-pay | Admitting: Cardiology

## 2011-12-12 NOTE — Telephone Encounter (Signed)
New Msg: Individual managing pt FMLA calling wanting to speak to nurse/MD regarding recommended time pt should be out of work. Please return call to discuss further.

## 2011-12-12 NOTE — Telephone Encounter (Signed)
Pt not feeling ready to return to work due to pain in her arms.  Appt coming up on 12/16/11 and will be evaluated at that time.  Her employer was notified.

## 2011-12-13 ENCOUNTER — Encounter: Payer: Self-pay | Admitting: Cardiology

## 2011-12-13 DIAGNOSIS — I5022 Chronic systolic (congestive) heart failure: Secondary | ICD-10-CM | POA: Insufficient documentation

## 2011-12-13 DIAGNOSIS — Z951 Presence of aortocoronary bypass graft: Secondary | ICD-10-CM | POA: Insufficient documentation

## 2011-12-13 DIAGNOSIS — J90 Pleural effusion, not elsewhere classified: Secondary | ICD-10-CM | POA: Insufficient documentation

## 2011-12-16 ENCOUNTER — Ambulatory Visit (INDEPENDENT_AMBULATORY_CARE_PROVIDER_SITE_OTHER): Payer: Managed Care, Other (non HMO) | Admitting: Cardiology

## 2011-12-16 ENCOUNTER — Encounter: Payer: Self-pay | Admitting: Cardiology

## 2011-12-16 DIAGNOSIS — I509 Heart failure, unspecified: Secondary | ICD-10-CM

## 2011-12-16 DIAGNOSIS — E119 Type 2 diabetes mellitus without complications: Secondary | ICD-10-CM

## 2011-12-16 DIAGNOSIS — E669 Obesity, unspecified: Secondary | ICD-10-CM

## 2011-12-16 DIAGNOSIS — I255 Ischemic cardiomyopathy: Secondary | ICD-10-CM

## 2011-12-16 DIAGNOSIS — E1169 Type 2 diabetes mellitus with other specified complication: Secondary | ICD-10-CM

## 2011-12-16 DIAGNOSIS — I2510 Atherosclerotic heart disease of native coronary artery without angina pectoris: Secondary | ICD-10-CM

## 2011-12-16 DIAGNOSIS — I059 Rheumatic mitral valve disease, unspecified: Secondary | ICD-10-CM

## 2011-12-16 DIAGNOSIS — I340 Nonrheumatic mitral (valve) insufficiency: Secondary | ICD-10-CM

## 2011-12-16 DIAGNOSIS — J90 Pleural effusion, not elsewhere classified: Secondary | ICD-10-CM

## 2011-12-16 DIAGNOSIS — I2589 Other forms of chronic ischemic heart disease: Secondary | ICD-10-CM

## 2011-12-16 MED ORDER — RAMIPRIL 2.5 MG PO CAPS: 2.5000 mg | ORAL_CAPSULE | Freq: Every day | ORAL | Status: DC

## 2011-12-16 MED ORDER — CARVEDILOL 3.125 MG PO TABS: 3.1250 mg | ORAL_TABLET | Freq: Two times a day (BID) | ORAL | Status: DC

## 2011-12-16 MED ORDER — POTASSIUM CHLORIDE CRYS ER 20 MEQ PO TBCR: 20.0000 meq | EXTENDED_RELEASE_TABLET | Freq: Every day | ORAL | Status: DC

## 2011-12-16 MED ORDER — SIMVASTATIN 20 MG PO TABS: 20.0000 mg | ORAL_TABLET | Freq: Every evening | ORAL | Status: DC

## 2011-12-16 MED ORDER — FUROSEMIDE 40 MG PO TABS: 40.0000 mg | ORAL_TABLET | Freq: Two times a day (BID) | ORAL | Status: DC

## 2011-12-16 NOTE — Assessment & Plan Note (Signed)
The patient underwent CABG in October, 2012. This was done after she presented with a large MI. She has significant left ventricular dysfunction. She does not need any further coronary workup at this time.

## 2011-12-16 NOTE — Assessment & Plan Note (Signed)
Her congestive heart failure is compensated at this time on her diuretics. Chemistry will be checked.

## 2011-12-16 NOTE — Assessment & Plan Note (Signed)
Clinically her effusions are improved. We will not proceed with a chest x-ray today. She will remain on her diuretic. Chemistry level be checked today.

## 2011-12-16 NOTE — Assessment & Plan Note (Signed)
The patient had only mild mitral regurgitation by echo in December, 2012. No further workup is needed at this time.

## 2011-12-16 NOTE — Patient Instructions (Addendum)
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

## 2011-12-16 NOTE — Assessment & Plan Note (Signed)
I explained to the patient the extreme importance of using medications for her left ventricular dysfunction. We will try once again to get her into blocker and ACE inhibitor restarted. I will not start spironolactone at this time. We will see her back to taper these medications over time.

## 2011-12-16 NOTE — Progress Notes (Signed)
HPI Patient is seen today to followup coronary disease and to followup a recent hospitalization. As part of today's evaluation I have reviewed the hospital records carefully. The patient was admitted on November 13 2011.  The patient has coronary disease. She had an MI and underwent CABG in October, 2012. She then presented to the hospital volume overloaded on November 13, 2011. She had a persistent pleural effusion. She had had a thoracentesis in late October 2 012. During the hospitalization in January there was a small effusion. She was diuresis with diuretics and she improved. Her edema decreased and went away. She was sent home on lisinopril and Coreg. However communication was not good with her pharmacy and ultimately she has not been on these medications. She has remained on a diuretic she was sent home on along with potassium. There was a decision to look into restarting spironolactone at a later date.  Fortunately by her maintaining her diuretic her volume status is stable. She is in fact feeling better. No Known Allergies  Current Outpatient Prescriptions  Medication Sig Dispense Refill  . ACCU-CHEK SMARTVIEW test strip       . aspirin 325 MG EC tablet Take 325 mg by mouth daily.        . carvedilol (COREG) 3.125 MG tablet TAKE 1 TABLET BY MOUTH TWICE DAILY WITH MEALS  60 tablet  0  . CRESTOR 20 MG tablet TAKE 1 TABLET BY MOUTH EVERY NIGHT AT BEDTIME  30 tablet  0  . furosemide (LASIX) 40 MG tablet Take 40 mg by mouth 2 (two) times daily.        Marland Kitchen glipiZIDE (GLUCOTROL) 5 MG tablet TAKE 1 TABLET BY MOUTH TWICE DAILY WITH MEALS  60 tablet  0  . oxycodone (OXY-IR) 5 MG capsule Take 1 capsule (5 mg total) by mouth every 4 (four) hours as needed. For pain  65 capsule  0  . potassium chloride SA (K-DUR,KLOR-CON) 20 MEQ tablet Take 20 mEq by mouth daily.          History   Social History  . Marital Status: Divorced    Spouse Name: N/A    Number of Children: N/A  . Years of Education: N/A    Occupational History  . Not on file.   Social History Main Topics  . Smoking status: Former Smoker    Types: Cigarettes    Quit date: 11/11/1978  . Smokeless tobacco: Never Used   Comment: "only a social smoker when I did smoke"  . Alcohol Use: Yes     11/13/11 "in the last 6 months I've had 1 drink"  . Drug Use: No  . Sexually Active: No   Other Topics Concern  . Not on file   Social History Narrative  . No narrative on file    Family History  Problem Relation Age of Onset  . Heart disease Father   . Cancer Neg Hx   . Diabetes Neg Hx   . Hypertension Neg Hx   . Kidney disease Neg Hx     Past Medical History  Diagnosis Date  . Obesity, morbid   . CHF (congestive heart failure)     Chronic systolic CHF  . History of tobacco abuse   . Hyperlipidemia 08/10/11  . Diabetes mellitus type 2 in obese 08/10/11  . CAD (coronary artery disease) 08/2011    NSTEMI with subsequent CABG 08/13/2011 with LIMA-LAD, SVG-diagonal, SVG-OM, SVG-PDA  . Ischemic cardiomyopathy 08/13/2011    EF 20% in  08/2011, still 25% 10/21/2011  . Pleural effusion     Requiring L thoracentesis 09/02/11  . Mitral regurgitation     Mild by echo 10/21/11  . Carpal tunnel syndrome on both sides     "since OHS 08/2011"  . Hypertension   . Hx of CABG     October, 2012    Past Surgical History  Procedure Date  . Coronary artery bypass graft 08/13/11    CABG x4 with LIMA to LAD, SVG to Diag, SVG to OM, SVG to PDA, EVH via both thighs  . Gallstones removed laparoscopy ~ 2000    ROS  Patient denies fever, chills, headache, sweats, rash, change in vision, change in hearing, chest pain. She does have tenderness from her mediastinal scar. She is not having shortness of breath. She's not had cough. She denies change in vision or change in hearing. She's having no GI or GU symptoms. All other systems are reviewed and are negative.  PHYSICAL EXAM The patient is here with a family member. She is oriented to  person time and place. Affect is normal. There is no jugulovenous distention. Lungs are clear. Respiratory effort is nonlabored. Cardiac exam reveals S1 and S2. There no clicks or significant murmurs. The abdomen is soft. There is no peripheral edema. There are no musculoskeletal deformities. There are no skin rashes. Filed Vitals:   12/16/11 1518  BP: 124/82  Pulse: 85  Height: 5\' 7"  (1.702 m)  Weight: 245 lb (111.131 kg)    ASSESSMENT & PLAN

## 2011-12-16 NOTE — Assessment & Plan Note (Signed)
Unfortunately the patient is not taking her diabetic medicine either. She has an appointment to see her primary physician within the next few days. Dr. Yetta Barre will have access to this record.

## 2011-12-17 LAB — BASIC METABOLIC PANEL
Chloride: 106 mEq/L (ref 96–112)
Potassium: 4.1 mEq/L (ref 3.5–5.1)

## 2011-12-19 ENCOUNTER — Ambulatory Visit (INDEPENDENT_AMBULATORY_CARE_PROVIDER_SITE_OTHER): Payer: Managed Care, Other (non HMO) | Admitting: Internal Medicine

## 2011-12-19 ENCOUNTER — Encounter: Payer: Self-pay | Admitting: Internal Medicine

## 2011-12-19 DIAGNOSIS — E119 Type 2 diabetes mellitus without complications: Secondary | ICD-10-CM

## 2011-12-19 DIAGNOSIS — G5600 Carpal tunnel syndrome, unspecified upper limb: Secondary | ICD-10-CM

## 2011-12-19 DIAGNOSIS — I509 Heart failure, unspecified: Secondary | ICD-10-CM

## 2011-12-19 DIAGNOSIS — E669 Obesity, unspecified: Secondary | ICD-10-CM

## 2011-12-19 DIAGNOSIS — E785 Hyperlipidemia, unspecified: Secondary | ICD-10-CM

## 2011-12-19 MED ORDER — GLIPIZIDE 5 MG PO TABS: ORAL_TABLET | ORAL | Status: DC

## 2011-12-19 NOTE — Assessment & Plan Note (Signed)
She can't take nsaids due to her history of CHF, I have asked her to go for OT in addition to ortho referral, she'll continue current meds for pain

## 2011-12-19 NOTE — Assessment & Plan Note (Signed)
Her recent FLP looks good

## 2011-12-19 NOTE — Patient Instructions (Signed)
Carpal Tunnel Syndrome The carpal tunnel is a narrow hollow area in the wrist. It is formed by the wrist bones and ligaments. Nerves, blood vessels, and tendons (cord like structures which attach muscle to bone) on the palm side (the side of your hand in the direction your fingers bend) of your hand pass through the carpal tunnel. Repeated wrist motion or certain diseases may cause swelling within the tunnel. (That is why these are called repetitive trauma (damage caused by over use) disorders. It is also a common problem in late pregnancy.) This swelling pinches the main nerve in the wrist (median nerve) and causes the painful condition called carpal tunnel syndrome. A feeling of "pins and needles" may be noticed in the fingers or hand; however, the entire arm may ache from this condition. Carpal tunnel syndrome may clear up by itself. Cortisone injections may help. Sometimes, an operation may be needed to free the pinched nerve. An electromyogram (a type of test) may be needed to confirm this diagnosis (learning what is wrong). This is a test which measures nerve conduction. The nerve conduction is usually slowed in a carpal tunnel syndrome. HOME CARE INSTRUCTIONS   If your caregiver prescribed medication to help reduce swelling, take as directed.   If you were given a splint to keep your wrist from bending, use it as instructed. It is important to wear the splint at night. Use the splint for as long as you have pain or numbness in your hand, arm or wrist. This may take 1 to 2 months.   If you have pain at night, it may help to rub or shake your hand, or elevate your hand above the level of your heart (the center of your chest).   It is important to give your wrist a rest by stopping the activities that are causing the problem. If your symptoms (problems) are work-related, you may need to talk to your employer about changing to a job that does not require using your wrist.   Only take over-the-counter  or prescription medicines for pain, discomfort, or fever as directed by your caregiver.   Following periods of extended use, particularly strenuous use, apply an ice pack wrapped in a towel to the anterior (palm) side of the affected wrist for 20 to 30 minutes. Repeat as needed three to four times per day. This will help reduce the swelling.   Follow all instructions for follow-up with your caregiver. This includes any orthopedic referrals, physical therapy, and rehabilitation. Any delay in obtaining necessary care could result in a delay or failure of your condition to heal.  SEEK IMMEDIATE MEDICAL CARE IF:   You are still having pain and numbness following a week of treatment.   You develop new, unexplained symptoms.   Your current symptoms are getting worse and are not helped or controlled with medications.  MAKE SURE YOU:   Understand these instructions.   Will watch your condition.   Will get help right away if you are not doing well or get worse.  Document Released: 10/25/2000 Document Revised: 07/10/2011 Document Reviewed: 06/01/2008 Exit[employer] Patient Information 2012 [employer]. 

## 2011-12-19 NOTE — Progress Notes (Signed)
  Subjective:    Patient ID: Christine Knight, female    DOB: 1951-07-11, 61 y.o.   MRN: [id number]  HPI  She needs help with bilateral wrist pain, a recent NCS/EMG showed mild-moderate CTS. She has been offered an ortho referral to consider surgical intervention.  Review of Systems  Constitutional: Negative.   HENT: Negative.   Eyes: Negative.   Respiratory: Negative for apnea, cough, choking, chest tightness, shortness of breath, wheezing and stridor.   Cardiovascular: Negative for chest pain, palpitations and leg swelling.  Gastrointestinal: Negative.   Genitourinary: Negative.   Musculoskeletal: Positive for arthralgias (both wrists). Negative for myalgias, back pain, joint swelling and gait problem.  Skin: Negative for color change, pallor, rash and wound.  Neurological: Negative.   Hematological: Negative for adenopathy. Does not bruise/bleed easily.  Psychiatric/Behavioral: Negative.        Objective:   Physical Exam  Vitals reviewed. Constitutional: She is oriented to person, place, and time. She appears well-developed and well-nourished. No distress.  HENT:  Head: Atraumatic.  Mouth/Throat: Oropharynx is clear and moist. No oropharyngeal exudate.  Eyes: Conjunctivae are normal. Right eye exhibits no discharge. Left eye exhibits no discharge. No scleral icterus.  Neck: Normal range of motion. Neck supple. No JVD present. No tracheal deviation present. No thyromegaly present.  Cardiovascular: Normal rate, regular rhythm, normal heart sounds and intact distal pulses.  Exam reveals no gallop and no friction rub.   No murmur heard. Pulmonary/Chest: Effort normal and breath sounds normal. No stridor. No respiratory distress. She has no wheezes. She has no rales. She exhibits no tenderness.  Abdominal: Soft. Bowel sounds are normal. She exhibits no distension and no mass. There is no tenderness. There is no rebound and no guarding.  Musculoskeletal: Normal range of motion. She  exhibits no edema and no tenderness.  Neurological: She is oriented to person, place, and time.  Skin: Skin is warm and dry. No rash noted. She is not diaphoretic. No erythema. No pallor.  Psychiatric: She has a normal mood and affect. Her behavior is normal. Judgment and thought content normal.      Lab Results  Component Value Date   WBC 5.2 11/13/2011   HGB 13.6 11/13/2011   HCT 41.4 11/13/2011   PLT 290 11/13/2011   GLUCOSE 171* 12/16/2011   CHOL 139 10/25/2011   TRIG 32.0 10/25/2011   HDL 53.80 10/25/2011   LDLCALC 79 10/25/2011   ALT 28 10/25/2011   AST 28 10/25/2011   NA 138 12/16/2011   K 4.1 12/16/2011   CL 106 12/16/2011   CREATININE 0.6 12/16/2011   BUN 15 12/16/2011   CO2 25 12/16/2011   TSH 1.37 10/25/2011   INR 1.09 08/21/2011   HGBA1C 6.4 10/25/2011      Assessment & Plan:

## 2011-12-19 NOTE — Assessment & Plan Note (Signed)
She has a normal fluid status today 

## 2011-12-19 NOTE — Assessment & Plan Note (Signed)
Her recent a1c looks good 

## 2011-12-20 ENCOUNTER — Telehealth: Payer: Self-pay

## 2011-12-20 NOTE — Telephone Encounter (Signed)
FLMA [employer] forms completed and faxed to [phone]. Original mailed to pt address

## 2011-12-26 ENCOUNTER — Telehealth: Payer: Self-pay

## 2011-12-26 NOTE — Telephone Encounter (Signed)
From my standpoint her disability is based on CTS so I think she will be out until that is seen and treated but the ortho surgeon

## 2011-12-26 NOTE — Telephone Encounter (Signed)
Christine Knight with pt disability called stating that pt was seen by PCP and cardiologist. Per Tammy cardiologist referred her back to PCP to determine if out of work leave should go thru 12/31/11 (cariologist appt) or if PCP feels that it should extend thru the next time patient see PCP which has not been scheduled or determined. Please advise Thanks

## 2011-12-31 ENCOUNTER — Encounter: Payer: Self-pay | Admitting: Physician Assistant

## 2011-12-31 ENCOUNTER — Ambulatory Visit (INDEPENDENT_AMBULATORY_CARE_PROVIDER_SITE_OTHER): Payer: Managed Care, Other (non HMO) | Admitting: Physician Assistant

## 2011-12-31 VITALS — BP 122/88 | HR 77 | Ht 67.0 in | Wt 257.0 lb

## 2011-12-31 DIAGNOSIS — I2589 Other forms of chronic ischemic heart disease: Secondary | ICD-10-CM

## 2011-12-31 DIAGNOSIS — I255 Ischemic cardiomyopathy: Secondary | ICD-10-CM

## 2011-12-31 DIAGNOSIS — I2510 Atherosclerotic heart disease of native coronary artery without angina pectoris: Secondary | ICD-10-CM

## 2011-12-31 DIAGNOSIS — I5022 Chronic systolic (congestive) heart failure: Secondary | ICD-10-CM

## 2011-12-31 MED ORDER — CARVEDILOL 6.25 MG PO TABS: 6.2500 mg | ORAL_TABLET | Freq: Two times a day (BID) | ORAL | Status: DC

## 2011-12-31 NOTE — Assessment & Plan Note (Signed)
She will need titration of her medications and follow up echo in 3 months to reassess LVF.  If EF still < 35%, she will need to see EP for consideration of ICD.  I discussed this with her today.  We also discussed the importance of beta blockers and ACE inhibitors in the treatment of systolic CHF.

## 2011-12-31 NOTE — Patient Instructions (Signed)
Your physician recommends that you schedule a follow-up appointment in: 2 WEEKS WITH SCOTT WEAVER, PA-C  Your physician recommends that you schedule a follow-up appointment in: 8 WEEKS WITH DR. KATZ  Your physician has recommended you make the following change in your medication: INCREASE COREG TO 6.25 MG TWICE DAILY A NEW PRESCRIPTION HAS BEEN SENT IN TODAY FOR YOU  Your physician recommends that you return for lab work in: TODAY BMET 428.22, 414.01

## 2011-12-31 NOTE — Assessment & Plan Note (Signed)
Doing well post CABG.  Continue ASA and statin.

## 2011-12-31 NOTE — Progress Notes (Signed)
[address] [place], [place]  [address] Phone: [phone] Fax:  [phone]  Date:  12/31/2011   Name:  Christine Knight       DOB:  April 17, 1951 MRN:  [id number]  PCP:  Dr. Sanda Linger Primary Cardiologist:  Dr. Zackery Barefoot  Primary Electrophysiologist:  None    History of Present Illness: Christine Knight is a 61 y.o. female who presents for follow up.  She has a history of CAD, status post recent NSTEMI followed by CABG 08/2011, Ischemic cardiomyopathy, systolic heart failure, diabetes, hyperlipidemia.  Last echocardiogram 12/12: EF 25%, mild MR, moderate LAE.  She was admitted 1/2-1/4 with acute on chronic systolic heart failure.  She was diuresed.  Coreg was increased.  ACE inhibitor and Aldactone were both initiated.  However, Aldactone was discontinued at discharge.  She was seen in follow up 12/16/11 by Dr. Myrtis Ser.  She was not on her ACE inhibitor at that time.  Weight was 245 pounds.  Volume status appeared stable.  ACE inhibitor was restarted.  Labs: Potassium 4.1, creatinine 0.6.  She was brought back for followup today and further titration of her medications.  She is doing well.  Median sternotomy scar is hypersensitive.  She also has CTS and is seeing OT at [facility].  Otherwise, denies chest pain.  He weight is up significantly today.  However, she notes she had her coat, gloves, etc on when she was weighed.  She denies increased DOE.  Notes class 2-2b symptoms.  No orthopnea, PND or edema.  No syncope.  No cough.    Past Medical History  Diagnosis Date  . Obesity, morbid   . Chronic systolic heart failure     Chronic systolic CHF  . History of tobacco abuse   . Hyperlipidemia 08/10/11  . Diabetes mellitus type 2 in obese 08/10/11  . CAD (coronary artery disease) 08/2011    NSTEMI with subsequent CABG 08/13/2011 with LIMA-LAD, SVG-diagonal, SVG-OM, SVG-PDA  . Ischemic cardiomyopathy 08/13/2011    EF 20% in 08/2011, still 25% 10/21/2011  .  Pleural effusion     Requiring L thoracentesis 09/02/11  . Mitral regurgitation     Mild by echo 10/21/11  . Carpal tunnel syndrome on both sides     "since OHS 08/2011"  . Hypertension   . Hx of CABG     October, 2012    Current Outpatient Prescriptions  Medication Sig Dispense Refill  . ACCU-CHEK SMARTVIEW test strip       . aspirin 325 MG EC tablet Take 325 mg by mouth daily.        . carvedilol (COREG) 3.125 MG tablet Take 1 tablet (3.125 mg total) by mouth 2 (two) times daily with a meal.  180 tablet  1  . furosemide (LASIX) 40 MG tablet Take 1 tablet (40 mg total) by mouth 2 (two) times daily.  180 tablet  3  . glipiZIDE (GLUCOTROL) 5 MG tablet TAKE 1 TABLET BY MOUTH TWICE DAILY WITH MEALS  180 tablet  1  . oxycodone (OXY-IR) 5 MG capsule Take 1 capsule (5 mg total) by mouth every 4 (four) hours as needed. For pain  65 capsule  0  . potassium chloride SA (K-DUR,KLOR-CON) 20 MEQ tablet Take 1 tablet (20 mEq total) by mouth daily.  90 tablet  3  . ramipril (ALTACE) 2.5 MG capsule Take 1 capsule (2.5 mg total) by mouth daily.  90 capsule  1  . simvastatin (ZOCOR) 20  MG tablet Take 1 tablet (20 mg total) by mouth every evening.  90 tablet  1    Allergies: No Known Allergies  History  Substance Use Topics  . Smoking status: Former Smoker    Types: Cigarettes    Quit date: 11/11/1978  . Smokeless tobacco: Never Used   Comment: "only a social smoker when I did smoke"  . Alcohol Use: Yes     11/13/11 "in the last 6 months I've had 1 drink"     ROS:  Please see the history of present illness.   All other systems reviewed and negative.   PHYSICAL EXAM: VS:  BP 122/88  Pulse 77  Ht 5\' 7"  (1.702 m)  Wt 257 lb (116.574 kg)  BMI 40.25 kg/m2 Well nourished, well developed, in no acute distress HEENT: normal Neck: no JVD Cardiac:  normal S1, S2; RRR; no murmur Lungs:  Decreased breath sounds bilaterally, no wheezing, rhonchi or rales Abd: soft, nontender, no hepatomegaly Ext:  no edema Skin: warm and dry Neuro:  CNs 2-12 intact, no focal abnormalities noted  EKG:  Sinus rhythm, heart rate 77, normal axis, poor R-wave progression, nonspecific ST-T wave changes  ASSESSMENT AND PLAN:

## 2011-12-31 NOTE — Assessment & Plan Note (Signed)
Volume is stable.  Weight today is unreliable.  She reports compliance with all of her medications.  She is limiting her salt.  Continue current dose of ramipril.  Increase carvedilol to 6.25 mg BID.  Check BMET today to follow up on renal fxn and K+ with recent initiation of ACE.  I will see her in 2 weeks for further titration of her medications.   I will set her up to see Dr. Zackery Barefoot in 8 weeks.

## 2012-01-01 ENCOUNTER — Telehealth: Payer: Self-pay | Admitting: *Deleted

## 2012-01-01 LAB — BASIC METABOLIC PANEL
CO2: 27 mEq/L (ref 19–32)
GFR: 101.22 mL/min (ref >60.00)
Glucose, Bld: 157 mg/dL — ABNORMAL HIGH (ref 70–99)
Potassium: 4 mEq/L (ref 3.5–5.1)
Sodium: 139 mEq/L (ref 135–145)

## 2012-01-01 NOTE — Telephone Encounter (Signed)
lmom labs normal. Christine Knight  

## 2012-01-01 NOTE — Telephone Encounter (Signed)
Message copied by Tarri Fuller on Wed Jan 01, 2012 12:23 PM ------      Message from: [place], [place] T      Created: Wed Jan 01, 2012 12:01 PM       Please notify patient that the lab results are ok.      Tereso Newcomer, PA-C  12:01 PM 01/01/2012

## 2012-01-02 ENCOUNTER — Ambulatory Visit: Payer: Managed Care, Other (non HMO) | Attending: Internal Medicine | Admitting: Occupational Therapy

## 2012-01-02 DIAGNOSIS — M6281 Muscle weakness (generalized): Secondary | ICD-10-CM | POA: Insufficient documentation

## 2012-01-02 DIAGNOSIS — M2550 Pain in unspecified joint: Secondary | ICD-10-CM | POA: Insufficient documentation

## 2012-01-02 DIAGNOSIS — IMO0001 Reserved for inherently not codable concepts without codable children: Secondary | ICD-10-CM | POA: Insufficient documentation

## 2012-01-02 NOTE — Telephone Encounter (Signed)
Returned call back to Tammy Gordon//LMOVM advising per MD

## 2012-01-07 ENCOUNTER — Encounter: Payer: Managed Care, Other (non HMO) | Admitting: Occupational Therapy

## 2012-01-09 ENCOUNTER — Ambulatory Visit: Payer: Managed Care, Other (non HMO) | Admitting: Occupational Therapy

## 2012-01-14 ENCOUNTER — Ambulatory Visit: Payer: Self-pay | Admitting: Physician Assistant

## 2012-01-14 ENCOUNTER — Ambulatory Visit: Payer: Managed Care, Other (non HMO) | Attending: Internal Medicine | Admitting: Occupational Therapy

## 2012-01-14 DIAGNOSIS — M2550 Pain in unspecified joint: Secondary | ICD-10-CM | POA: Insufficient documentation

## 2012-01-14 DIAGNOSIS — IMO0001 Reserved for inherently not codable concepts without codable children: Secondary | ICD-10-CM | POA: Insufficient documentation

## 2012-01-14 DIAGNOSIS — M6281 Muscle weakness (generalized): Secondary | ICD-10-CM | POA: Insufficient documentation

## 2012-01-16 ENCOUNTER — Ambulatory Visit: Payer: Managed Care, Other (non HMO) | Admitting: Occupational Therapy

## 2012-01-21 ENCOUNTER — Ambulatory Visit: Payer: Managed Care, Other (non HMO) | Admitting: Occupational Therapy

## 2012-01-22 ENCOUNTER — Encounter: Payer: Self-pay | Admitting: Physician Assistant

## 2012-01-22 ENCOUNTER — Ambulatory Visit (INDEPENDENT_AMBULATORY_CARE_PROVIDER_SITE_OTHER): Payer: Managed Care, Other (non HMO) | Admitting: Physician Assistant

## 2012-01-22 VITALS — BP 120/78 | HR 70 | Ht 67.0 in | Wt 251.0 lb

## 2012-01-22 DIAGNOSIS — I255 Ischemic cardiomyopathy: Secondary | ICD-10-CM

## 2012-01-22 DIAGNOSIS — I5022 Chronic systolic (congestive) heart failure: Secondary | ICD-10-CM

## 2012-01-22 DIAGNOSIS — I2510 Atherosclerotic heart disease of native coronary artery without angina pectoris: Secondary | ICD-10-CM

## 2012-01-22 DIAGNOSIS — I2589 Other forms of chronic ischemic heart disease: Secondary | ICD-10-CM

## 2012-01-22 MED ORDER — PRAVASTATIN SODIUM 40 MG PO TABS: 40.0000 mg | ORAL_TABLET | Freq: Every day | ORAL | Status: DC

## 2012-01-22 MED ORDER — LISINOPRIL 5 MG PO TABS: 5.0000 mg | ORAL_TABLET | Freq: Every day | ORAL | Status: DC

## 2012-01-22 MED ORDER — POTASSIUM CHLORIDE CRYS ER 20 MEQ PO TBCR: 20.0000 meq | EXTENDED_RELEASE_TABLET | Freq: Every day | ORAL | Status: DC

## 2012-01-22 NOTE — Patient Instructions (Addendum)
Your physician has recommended you make the following change in your medication: START LISINOPRIL 5 MG 1 TABLET DAILY;  START PRAVASTATIN 40 MG 1 TABLET EVERY NIGHT  STOP RAMIPRIL (ALTACE); STOP SIMVASTATIN  Your physician recommends that you return for lab work in: 1 WEEK 01/29/12 BMET 414.01  Your physician recommends that you return for lab work in: 6 WEEKS AROUND MAY 1ST FOR FASTING LIPID/LIVER PANEL 272.4  KEEP APPOINTMENT WITH DR. KATZ 03/04/12 @ 2:30

## 2012-01-22 NOTE — Progress Notes (Signed)
[address] [place], [place]  [address] Phone: [phone] Fax:  [phone]  Date:  01/22/2012   Name:  Christine Knight       DOB:  Sep 09, 1951 MRN:  [id number]  PCP:  Dr. Sanda Linger Primary Cardiologist:  Dr. Zackery Barefoot  Primary Electrophysiologist:  None    History of Present Illness: Christine Knight is a 61 y.o. female who presents for follow up.  She has a history of CAD, status post recent NSTEMI followed by CABG 08/2011, Ischemic cardiomyopathy, systolic heart failure, diabetes, hyperlipidemia.  Last echocardiogram 12/12: EF 25%, mild MR, moderate LAE.  Admitted 11/2011 with a/c systolic heart failure.  Seen in follow up 12/16/11 by Dr. Myrtis Ser.  She was not on her ACE inhibitor at that time and this was restarted.    I saw her 12/31/11.  I increased her carvedilol.  She was brought back today for further titration of her medications.  Doing well.  The patient denies chest pain, shortness of breath, syncope, orthopnea, PND or significant pedal edema.  She ran out of K+, simvastatin and ramipril 3 days ago (too expensive).  Past Medical History  Diagnosis Date  . Obesity, morbid   . Chronic systolic heart failure     Chronic systolic CHF  . History of tobacco abuse   . Hyperlipidemia 08/10/11  . Diabetes mellitus type 2 in obese 08/10/11  . CAD (coronary artery disease) 08/2011    NSTEMI with subsequent CABG 08/13/2011 with LIMA-LAD, SVG-diagonal, SVG-OM, SVG-PDA  . Ischemic cardiomyopathy 08/13/2011    EF 20% in 08/2011, still 25% 10/21/2011  . Pleural effusion     Requiring L thoracentesis 09/02/11  . Mitral regurgitation     Mild by echo 10/21/11  . Carpal tunnel syndrome on both sides     "since OHS 08/2011"  . Hypertension   . Hx of CABG     October, 2012    Current Outpatient Prescriptions  Medication Sig Dispense Refill  . ACCU-CHEK SMARTVIEW test strip       . aspirin 325 MG EC tablet Take 325 mg by mouth daily.        . carvedilol  (COREG) 6.25 MG tablet Take 1 tablet (6.25 mg total) by mouth 2 (two) times daily.  60 tablet  11  . furosemide (LASIX) 40 MG tablet Take 1 tablet (40 mg total) by mouth 2 (two) times daily.  180 tablet  3  . glipiZIDE (GLUCOTROL) 5 MG tablet TAKE 1 TABLET BY MOUTH TWICE DAILY WITH MEALS  180 tablet  1  . oxycodone (OXY-IR) 5 MG capsule Take 1 capsule (5 mg total) by mouth every 4 (four) hours as needed. For pain  65 capsule  0  . potassium chloride SA (K-DUR,KLOR-CON) 20 MEQ tablet Take 1 tablet (20 mEq total) by mouth daily.  90 tablet  3  . ramipril (ALTACE) 2.5 MG capsule Take 1 capsule (2.5 mg total) by mouth daily.  90 capsule  1  . simvastatin (ZOCOR) 20 MG tablet Take 1 tablet (20 mg total) by mouth every evening.  90 tablet  1    Allergies: No Known Allergies   History  Substance Use Topics  . Smoking status: Former Smoker    Types: Cigarettes    Quit date: 11/11/1978  . Smokeless tobacco: Never Used   Comment: "only a social smoker when I did smoke"  . Alcohol Use: Yes     11/13/11 "in the last 6 months I've had  1 drink"     ROS:  Please see the history of present illness.   All other systems reviewed and negative.   PHYSICAL EXAM: VS:  BP 120/78  Pulse 70  Ht 5\' 7"  (1.702 m)  Wt 251 lb (113.853 kg)  BMI 39.31 kg/m2 Well nourished, well developed, in no acute distress HEENT: normal Neck: no JVD Cardiac:  normal S1, S2; RRR; no murmur Lungs:  Decreased breath sounds bilaterally, no wheezing, rhonchi or rales Abd: soft, nontender, no hepatomegaly Ext: no edema Skin: warm and dry Neuro:  CNs 2-12 intact, no focal abnormalities noted  Labs:  Lab Results  Component Value Date   CREATININE 0.8 12/31/2011   BUN 25* 12/31/2011   NA 139 12/31/2011   K 4.0 12/31/2011   CL 106 12/31/2011   CO2 27 12/31/2011     ASSESSMENT AND PLAN:   1. CAD (coronary artery disease)  Stable.  Continue ASA and statin.  Follow up with Dr. Zackery Barefoot in April.   2. Chronic systolic  heart failure  Volume stable.  Change ramipril to lisinopril 5 mg QD ($4 at [facility]).  Check BMET in one week.  Refill K+.     3. Ischemic cardiomyopathy  Follow up with Dr. Zackery Barefoot in April.  She will need a repeat echo scheduled sometime in May to reassess LVF.   4.  Hyperlipidemia      Change simvastatin to pravastatin 40 mg QHS ($4).  Check FLP and LFTs in 6 weeks.   Signed, Tereso Newcomer, PA-C  2:19 PM 01/22/2012

## 2012-01-23 ENCOUNTER — Ambulatory Visit: Payer: Managed Care, Other (non HMO) | Admitting: Occupational Therapy

## 2012-01-28 ENCOUNTER — Ambulatory Visit: Payer: Managed Care, Other (non HMO) | Admitting: Occupational Therapy

## 2012-01-29 ENCOUNTER — Other Ambulatory Visit: Payer: Managed Care, Other (non HMO)

## 2012-01-30 ENCOUNTER — Other Ambulatory Visit (INDEPENDENT_AMBULATORY_CARE_PROVIDER_SITE_OTHER): Payer: Self-pay

## 2012-01-30 ENCOUNTER — Encounter: Payer: Managed Care, Other (non HMO) | Admitting: Occupational Therapy

## 2012-01-30 DIAGNOSIS — I2589 Other forms of chronic ischemic heart disease: Secondary | ICD-10-CM

## 2012-01-30 DIAGNOSIS — I255 Ischemic cardiomyopathy: Secondary | ICD-10-CM

## 2012-01-30 DIAGNOSIS — R0989 Other specified symptoms and signs involving the circulatory and respiratory systems: Secondary | ICD-10-CM

## 2012-01-30 DIAGNOSIS — I2510 Atherosclerotic heart disease of native coronary artery without angina pectoris: Secondary | ICD-10-CM

## 2012-01-30 DIAGNOSIS — I5022 Chronic systolic (congestive) heart failure: Secondary | ICD-10-CM

## 2012-01-30 LAB — HEPATIC FUNCTION PANEL
AST: 35 U/L (ref 0–37)
Albumin: 3.7 g/dL (ref 3.5–5.2)
Alkaline Phosphatase: 95 U/L (ref 39–117)
Total Bilirubin: 0.3 mg/dL (ref 0.3–1.2)

## 2012-01-30 LAB — BASIC METABOLIC PANEL
CO2: 26 mEq/L (ref 19–32)
Chloride: 101 mEq/L (ref 96–112)
Glucose, Bld: 98 mg/dL (ref 70–99)
Sodium: 136 mEq/L (ref 135–145)

## 2012-01-30 LAB — LIPID PANEL
HDL: 51 mg/dL (ref >39.00)
LDL Cholesterol: 112 mg/dL — ABNORMAL HIGH (ref 0–99)
Total CHOL/HDL Ratio: 3
Triglycerides: 57 mg/dL (ref 0.0–149.0)

## 2012-01-31 ENCOUNTER — Telehealth: Payer: Self-pay | Admitting: *Deleted

## 2012-01-31 NOTE — Telephone Encounter (Signed)
Message copied by Tarri Fuller on Fri Jan 31, 2012  2:39 PM ------      Message from: Reine Just      Created: Thu Jan 30, 2012  6:53 PM                   ----- Message -----         From: Beatrice Lecher, PA         Sent: 01/30/2012   5:22 PM           To: Reine Just, CMA, Tarri Fuller, CMA            BMET ok      I just changed simvastatin to pravastatin and plan was for Lipids and LFTs in 6 weeks      Credit her for lipid and LFTs done today and schedule for 6 weeks from now      [place], [place]  5:22 PM 01/30/2012

## 2012-01-31 NOTE — Telephone Encounter (Signed)
lmom labs ok, pt had L/L done in error, was noted for L/L to be done 03/11/12 but lab drew any way. I had pt acct credited for the L/L since these were not to be done until 03/11/12, pt will still have to L/L done 03/11/12 due to statin change. Danielle Rankin

## 2012-02-13 ENCOUNTER — Encounter (HOSPITAL_COMMUNITY)
Admission: RE | Admit: 2012-02-13 | Discharge: 2012-02-13 | Disposition: A | Discharge status: Home / Self Care | Payer: Managed Care, Other (non HMO) | Source: Ambulatory Visit | Attending: Cardiology | Admitting: Cardiology

## 2012-02-13 DIAGNOSIS — E119 Type 2 diabetes mellitus without complications: Secondary | ICD-10-CM | POA: Insufficient documentation

## 2012-02-13 DIAGNOSIS — I252 Old myocardial infarction: Secondary | ICD-10-CM | POA: Insufficient documentation

## 2012-02-13 DIAGNOSIS — Z7982 Long term (current) use of aspirin: Secondary | ICD-10-CM | POA: Insufficient documentation

## 2012-02-13 DIAGNOSIS — I509 Heart failure, unspecified: Secondary | ICD-10-CM | POA: Insufficient documentation

## 2012-02-13 DIAGNOSIS — Z951 Presence of aortocoronary bypass graft: Secondary | ICD-10-CM | POA: Insufficient documentation

## 2012-02-13 DIAGNOSIS — I5022 Chronic systolic (congestive) heart failure: Secondary | ICD-10-CM | POA: Insufficient documentation

## 2012-02-13 DIAGNOSIS — E785 Hyperlipidemia, unspecified: Secondary | ICD-10-CM | POA: Insufficient documentation

## 2012-02-13 DIAGNOSIS — Z5189 Encounter for other specified aftercare: Secondary | ICD-10-CM | POA: Insufficient documentation

## 2012-02-13 DIAGNOSIS — I2589 Other forms of chronic ischemic heart disease: Secondary | ICD-10-CM | POA: Insufficient documentation

## 2012-02-13 DIAGNOSIS — I2510 Atherosclerotic heart disease of native coronary artery without angina pectoris: Secondary | ICD-10-CM | POA: Insufficient documentation

## 2012-02-13 NOTE — Progress Notes (Signed)
Cardiac Rehab Medication Review by a Pharmacist  Does the patient  feel that his/her medications are working for him/her?  yes  Has the patient been experiencing any side effects to the medications prescribed?  yes  Does the patient measure his/her own blood pressure or blood glucose at home?  yes   Does the patient have any problems obtaining medications due to transportation or finances?   no  Understanding of regimen: good Understanding of indications: good Potential of compliance: good    Pharmacist comments: Patient reports feeling tired and constipated since starting coreg.  I encouraged her to continue with it as the symptoms should alleviate within a few weeks.  She also reports a dry cough with lisinopril.  Could an ARB be an option for her?    Ival Bible 02/13/2012 8:37 AM

## 2012-02-17 ENCOUNTER — Encounter (HOSPITAL_COMMUNITY)
Admission: RE | Admit: 2012-02-17 | Discharge: 2012-02-17 | Disposition: A | Discharge status: Home / Self Care | Payer: Managed Care, Other (non HMO) | Source: Ambulatory Visit | Attending: Cardiology | Admitting: Cardiology

## 2012-02-17 ENCOUNTER — Encounter (HOSPITAL_COMMUNITY): Payer: Self-pay

## 2012-02-17 LAB — GLUCOSE, CAPILLARY: Glucose-Capillary: 150 mg/dL — ABNORMAL HIGH (ref 70–99)

## 2012-02-17 NOTE — Progress Notes (Signed)
Pt started cardiac rehab today.  Pt tolerated light exercise without difficulty.  Pt denies chest pain or dyspnea.  Telemetry-NSR, frequent multifocal PVC, rare PAC.  VSS.  Pt oriented to exercise equipment and routine.  Understanding verbalized.  Will continue to monitor the patient throughout  the program.-jrion,rn

## 2012-02-19 ENCOUNTER — Encounter (HOSPITAL_COMMUNITY): Payer: Managed Care, Other (non HMO)

## 2012-02-19 ENCOUNTER — Telehealth: Payer: Self-pay | Admitting: Cardiology

## 2012-02-19 NOTE — Telephone Encounter (Signed)
Patient request return call at [phone]  Patient is having problems with medication, c/o coughing and fatigue.  Please return call to patient [phone]

## 2012-02-19 NOTE — Telephone Encounter (Signed)
N/A.  LMTC. 

## 2012-02-20 NOTE — Progress Notes (Signed)
Christine Knight 61 y.o. female       Nutrition Screen                                                                    YES  NO Do you live in a nursing home?  X   Do you eat out more than 3 times/week?    X If yes, how many times per week do you eat out?  Do you have food allergies?   X If yes, what are you allergic to?  Have you gained or lost more than 10 lbs without trying?               X If yes, how much weight have you lost and over what time period?  lbs gained or lost over  weeks/month  Do you want to lose weight?    X  If yes, what is a goal weight or amount of weight you would like to lose?  lb  Do you eat alone most of the time?   X   Do you eat less than 2 meals/day?  X If yes, how many meals do you eat?  Do you drink more than 3 alcohol drinks/day?  X If yes, how many drinks per day?  Are you having trouble with constipation? *  X If yes, what are you doing to help relieve constipation?  Do you have financial difficulties with buying food?*    X   Are you experiencing regular nausea/ vomiting?*     X   Do you have a poor appetite? *                                        X   Do you have trouble chewing/swallowing? *   X    Pt with diagnoses of:  X CABG              X Dyslipidemia  / HDL< 40 / LDL>70 / High TG      X %  Body fat >goal / Body Mass Index >25 X MI X CHF  X DM/A1c >6 / CBG >126       Pt Risk Score   1       Diagnosis Risk Score  120       Total Risk Score   121                        X High Risk                Low Risk    HT: 67" Ht Readings from Last 1 Encounters:  02/13/12 5\' 7"  (1.702 m)    WT:   254.5 lb (115.7 kg) Wt Readings from Last 3 Encounters:  02/13/12 255 lb 1.2 oz (115.7 kg)  01/22/12 251 lb (113.853 kg)  12/31/11 257 lb (116.574 kg)     IBW 61.4 189%IBW BMI 39.9 50.6%body fat  Meds reviewed: Glipizide, Lasix, Potassium Chloride Past Medical History  Diagnosis Date  . Obesity, morbid   . Chronic systolic heart failure    Chronic systolic CHF  .  History of tobacco abuse   . Hyperlipidemia 08/10/11  . Diabetes mellitus type 2 in obese 08/10/11  . CAD (coronary artery disease) 08/2011    NSTEMI with subsequent CABG 08/13/2011 with LIMA-LAD, SVG-diagonal, SVG-OM, SVG-PDA  . Ischemic cardiomyopathy 08/13/2011    EF 20% in 08/2011, still 25% 10/21/2011  . Pleural effusion     Requiring L thoracentesis 09/02/11  . Mitral regurgitation     Mild by echo 10/21/11  . Carpal tunnel syndrome on both sides     "since OHS 08/2011"  . Hypertension   . Hx of CABG     October, 2012       Activity level: Pt is moderately active  Wt goal: 230-242 lb ( 104.5-110 kg) Current tobacco use? No Food/Drug Interaction? No Labs:  Lipid Panel     Component Value Date/Time   CHOL 174 01/30/2012 1314   TRIG 57.0 01/30/2012 1314   HDL 51.00 01/30/2012 1314   CHOLHDL 3 01/30/2012 1314   VLDL 11.4 01/30/2012 1314   LDLCALC 112* 01/30/2012 1314    Lab Results  Component Value Date   HGBA1C 6.4 10/25/2011   01/30/12 Glucose 98  LDL goal: < 70      MI, DM and > 2:      family h/o, >61 yo female Estimated Daily Nutrition Needs for: ? wt loss  1650-2150 Kcal , Total Fat 45-60gm, Saturated Fat 12-16 gm, Trans Fat 1.6-2.2 gm,  Sodium less than 1500 mg , Grams of CHO 175-250

## 2012-02-20 NOTE — Telephone Encounter (Signed)
NA. Voice mail full 

## 2012-02-21 ENCOUNTER — Encounter (HOSPITAL_COMMUNITY)
Admission: RE | Admit: 2012-02-21 | Discharge: 2012-02-21 | Disposition: A | Discharge status: Home / Self Care | Payer: Managed Care, Other (non HMO) | Source: Ambulatory Visit | Attending: Cardiology | Admitting: Cardiology

## 2012-02-21 ENCOUNTER — Telehealth: Payer: Self-pay | Admitting: Cardiology

## 2012-02-21 MED ORDER — RAMIPRIL 2.5 MG PO CAPS: 2.5000 mg | ORAL_CAPSULE | Freq: Every day | ORAL | Status: DC

## 2012-02-21 NOTE — Telephone Encounter (Signed)
Christine Knight states that last time she saw Tereso Newcomer, he changed her ramipril to lisinopril because the ramipril was too expensive.  She states she has developed a dry cough and tiredness from the lisinopril.  She states her insurance has been reinstated and wishes to go back on the ramipril.

## 2012-02-21 NOTE — Telephone Encounter (Signed)
Ok per Dr Myrtis Ser for pt to go back to the Ramipril.  Rx was sent to [facility] per her request.

## 2012-02-21 NOTE — Telephone Encounter (Signed)
Pt notified that refill was called to pharmacy

## 2012-02-21 NOTE — Telephone Encounter (Signed)
Patient returning nurse Debbie call, she can be reached at [phone].

## 2012-02-21 NOTE — Telephone Encounter (Signed)
Patient missed nurse Debbie call once again, she can be reached at [phone] for return call.

## 2012-02-24 ENCOUNTER — Encounter (HOSPITAL_COMMUNITY)
Admission: RE | Admit: 2012-02-24 | Discharge: 2012-02-24 | Disposition: A | Discharge status: Home / Self Care | Payer: Managed Care, Other (non HMO) | Source: Ambulatory Visit | Attending: Cardiology | Admitting: Cardiology

## 2012-02-24 LAB — GLUCOSE, CAPILLARY
Glucose-Capillary: 137 mg/dL — ABNORMAL HIGH (ref 70–99)
Glucose-Capillary: 96 mg/dL (ref 70–99)

## 2012-02-26 ENCOUNTER — Encounter (HOSPITAL_COMMUNITY)
Admission: RE | Admit: 2012-02-26 | Discharge: 2012-02-26 | Disposition: A | Discharge status: Home / Self Care | Payer: Managed Care, Other (non HMO) | Source: Ambulatory Visit | Attending: Cardiology | Admitting: Cardiology

## 2012-02-26 LAB — GLUCOSE, CAPILLARY: Glucose-Capillary: 173 mg/dL — ABNORMAL HIGH (ref 70–99)

## 2012-02-28 ENCOUNTER — Encounter (HOSPITAL_COMMUNITY): Payer: Managed Care, Other (non HMO)

## 2012-03-02 ENCOUNTER — Encounter (HOSPITAL_COMMUNITY)
Admission: RE | Admit: 2012-03-02 | Discharge: 2012-03-02 | Disposition: A | Discharge status: Home / Self Care | Payer: Managed Care, Other (non HMO) | Source: Ambulatory Visit | Attending: Cardiology | Admitting: Cardiology

## 2012-03-04 ENCOUNTER — Encounter (HOSPITAL_COMMUNITY): Payer: Managed Care, Other (non HMO)

## 2012-03-04 ENCOUNTER — Encounter: Payer: Self-pay | Admitting: Cardiology

## 2012-03-04 ENCOUNTER — Ambulatory Visit (INDEPENDENT_AMBULATORY_CARE_PROVIDER_SITE_OTHER): Payer: Managed Care, Other (non HMO) | Admitting: Cardiology

## 2012-03-04 VITALS — BP 131/87 | HR 76 | Ht 67.0 in | Wt 254.0 lb

## 2012-03-04 DIAGNOSIS — I2589 Other forms of chronic ischemic heart disease: Secondary | ICD-10-CM

## 2012-03-04 DIAGNOSIS — I2510 Atherosclerotic heart disease of native coronary artery without angina pectoris: Secondary | ICD-10-CM

## 2012-03-04 DIAGNOSIS — I255 Ischemic cardiomyopathy: Secondary | ICD-10-CM

## 2012-03-04 MED ORDER — CARVEDILOL 12.5 MG PO TABS: 12.5000 mg | ORAL_TABLET | Freq: Two times a day (BID) | ORAL | Status: DC

## 2012-03-04 NOTE — Patient Instructions (Signed)
Your physician recommends that you schedule a follow-up appointment in: 2 weeks  Your physician has recommended you make the following change in your medication: Increase your carvedilol (coreg) to 12.5mg  twice daily  Please call Debby Jasiya Markie at [phone] and have me paged to give me a list of your medications

## 2012-03-04 NOTE — Assessment & Plan Note (Signed)
Coronary disease is stable post CABG.

## 2012-03-04 NOTE — Assessment & Plan Note (Signed)
The patient really is feeling well. She will be calling back today to reconfirm exactly what he said have been there is she is on. There is question that she is on both lisinopril and Altace, both at low doses. Her carvedilol dose will be increased today from 6.25 twice a day to 12.5 twice a day. I will plan to see her back over several weeks. I had a careful discussion with her explaining the rationale for titrating these medications. She seems to understand.

## 2012-03-04 NOTE — Progress Notes (Signed)
HPI Patient returns today to followup coronary disease and ischemic cardiomyopathy. She is feeling well. She works with [employer] and is ready to return to work. She's not having any chest pain or shortness of breath. She does have significant left ventricular dysfunction. Her echo in December, 2012 showed that she still had significant LV dysfunction. We have been trying to titrate her medications and there is more titration to be done.  No Known Allergies  Current Outpatient Prescriptions  Medication Sig Dispense Refill  . ACCU-CHEK SMARTVIEW test strip       . aspirin 325 MG EC tablet Take 325 mg by mouth daily.        . carvedilol (COREG) 6.25 MG tablet Take 1 tablet (6.25 mg total) by mouth 2 (two) times daily.  60 tablet  11  . furosemide (LASIX) 40 MG tablet Take 1 tablet (40 mg total) by mouth 2 (two) times daily.  180 tablet  3  . glipiZIDE (GLUCOTROL) 5 MG tablet TAKE 1 TABLET BY MOUTH TWICE DAILY WITH MEALS  180 tablet  1  . lisinopril (PRINIVIL,ZESTRIL) 5 MG tablet Take 1 tablet (5 mg total) by mouth daily.  30 tablet  11  . potassium chloride SA (K-DUR,KLOR-CON) 20 MEQ tablet Take 1 tablet (20 mEq total) by mouth daily.  30 tablet  11  . pravastatin (PRAVACHOL) 40 MG tablet Take 1 tablet (40 mg total) by mouth daily.  30 tablet  11  . ramipril (ALTACE) 2.5 MG capsule Take 1 capsule (2.5 mg total) by mouth daily.  90 capsule  2  . DISCONTD: simvastatin (ZOCOR) 20 MG tablet Take 1 tablet (20 mg total) by mouth every evening.  90 tablet  1    History   Social History  . Marital Status: Divorced    Spouse Name: N/A    Number of Children: N/A  . Years of Education: N/A   Occupational History  . Not on file.   Social History Main Topics  . Smoking status: Former Smoker    Types: Cigarettes    Quit date: 11/11/1978  . Smokeless tobacco: Never Used   Comment: "only a social smoker when I did smoke"  . Alcohol Use: Yes     11/13/11 "in the last 6 months I've had 1  drink"  . Drug Use: No  . Sexually Active: No   Other Topics Concern  . Not on file   Social History Narrative  . No narrative on file    Family History  Problem Relation Age of Onset  . Heart disease Father   . Cancer Neg Hx   . Diabetes Neg Hx   . Hypertension Neg Hx   . Kidney disease Neg Hx     Past Medical History  Diagnosis Date  . Obesity, morbid   . Chronic systolic heart failure     Chronic systolic CHF  . History of tobacco abuse   . Hyperlipidemia 08/10/11  . Diabetes mellitus type 2 in obese 08/10/11  . CAD (coronary artery disease) 08/2011    NSTEMI with subsequent CABG 08/13/2011 with LIMA-LAD, SVG-diagonal, SVG-OM, SVG-PDA  . Ischemic cardiomyopathy 08/13/2011    EF 20% in 08/2011, still 25% 10/21/2011  . Pleural effusion     Requiring L thoracentesis 09/02/11  . Mitral regurgitation     Mild by echo 10/21/11  . Carpal tunnel syndrome on both sides     "since OHS 08/2011"  . Hypertension   . Hx of CABG  October, 2012    Past Surgical History  Procedure Date  . Coronary artery bypass graft 08/13/11    CABG x4 with LIMA to LAD, SVG to Diag, SVG to OM, SVG to PDA, EVH via both thighs  . Gallstones removed laparoscopy ~ 2000    ROS   Patient denies fever, chills, headache, sweats, rash, change in vision, change in hearing, chest pain, cough, nausea vomiting, urinary symptoms. All other systems are reviewed and are negative.  PHYSICAL EXAM  Patient is oriented to person time and place. Affect is normal. She is overweight. She is trying to lose weight. Lungs are clear. Respiratory effort is nonlabored. Cardiac exam reveals S1 and S2. There no clicks or significant murmurs. The abdomen is soft. There is no significant peripheral edema.  Filed Vitals:   03/04/12 1443  BP: 131/87  Pulse: 76  Height: 5\' 7"  (1.702 m)  Weight: 254 lb (115.214 kg)     ASSESSMENT & PLAN

## 2012-03-06 ENCOUNTER — Encounter (HOSPITAL_COMMUNITY): Payer: Managed Care, Other (non HMO)

## 2012-03-06 IMAGING — CR DG CHEST 2V
2 series · 2 of 2 positions shown · non-contrast
Comparison: 09/02/2011

CLINICAL DATA: Post heart surgery, follow-up

CHEST - 2 VIEW

[view not recorded (1 of 2)]
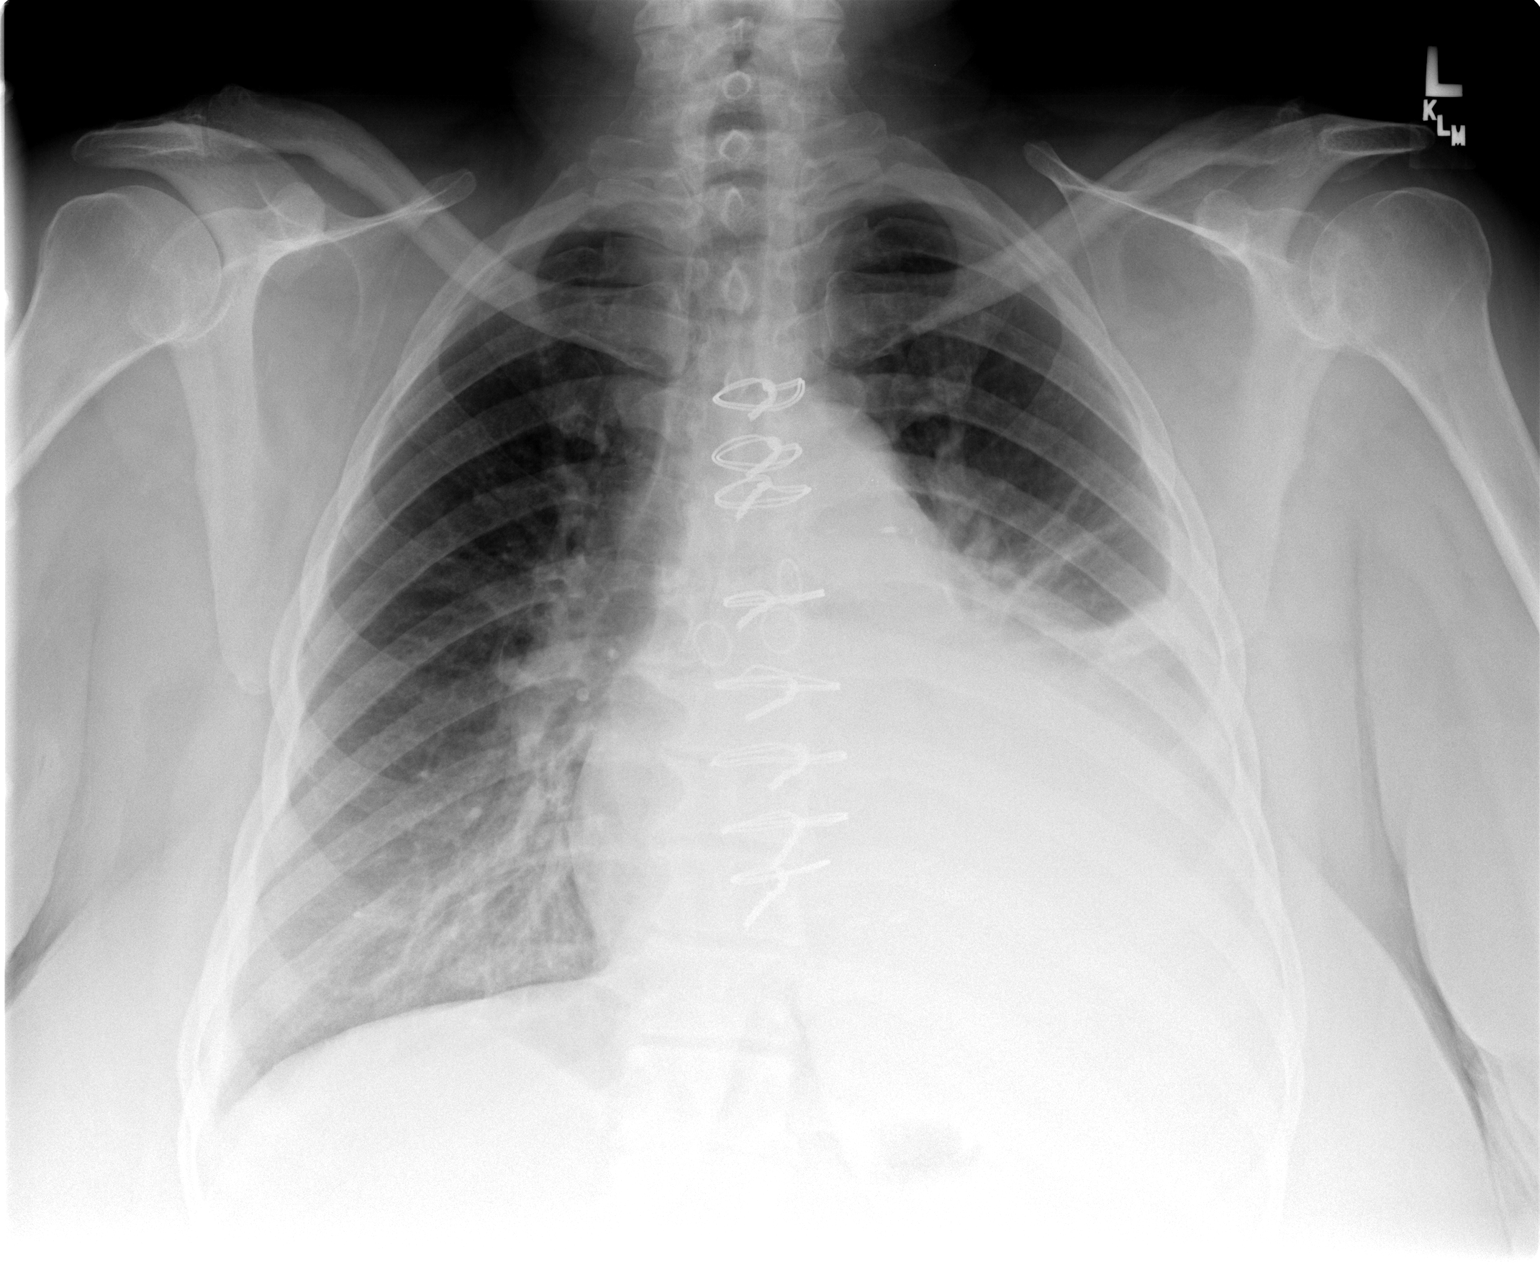

[view not recorded (2 of 2)]
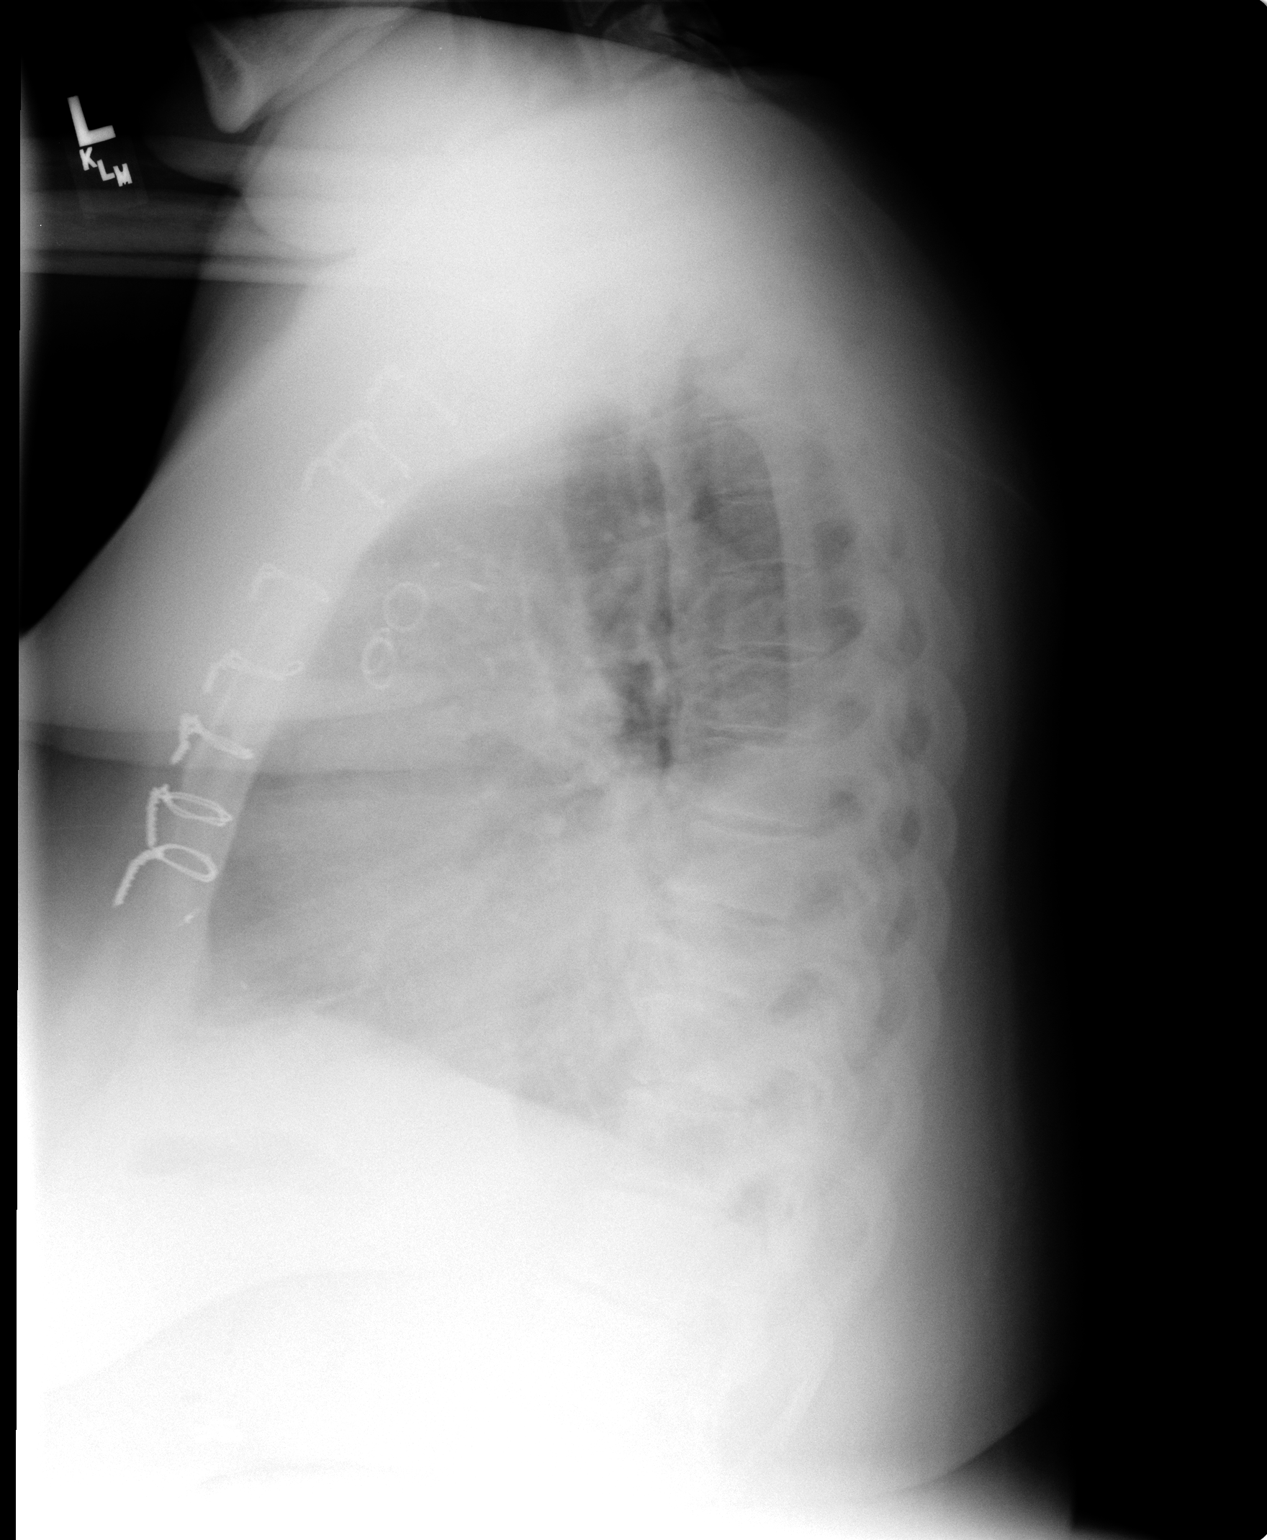

[2 of 2 positions shown; findings below may reference images not displayed]

FINDINGS: Enlargement of cardiac silhouette post CABG.
Pulmonary vascular congestion.
Moderate sized left pleural effusion and basilar atelectasis.
Minimal right base atelectasis.
Upper lungs clear.
No acute osseous findings.
IMPRESSION: Enlargement of cardiac silhouette post CABG.
New moderate sized left pleural effusion with significant
atelectasis of mid to lower left lung.

## 2012-03-09 ENCOUNTER — Encounter (HOSPITAL_COMMUNITY)
Admission: RE | Admit: 2012-03-09 | Discharge: 2012-03-09 | Disposition: A | Discharge status: Home / Self Care | Payer: Managed Care, Other (non HMO) | Source: Ambulatory Visit | Attending: Cardiology | Admitting: Cardiology

## 2012-03-09 ENCOUNTER — Telehealth: Payer: Self-pay

## 2012-03-09 ENCOUNTER — Encounter: Payer: Self-pay | Admitting: Cardiology

## 2012-03-09 NOTE — Progress Notes (Addendum)
The patient called back listing her medicines. She is on lisinopril but not ramapril. I gave instructions to increase her lisinopril to 10 mg daily.

## 2012-03-09 NOTE — Telephone Encounter (Signed)
N/A.  LMTC. 

## 2012-03-09 NOTE — Telephone Encounter (Signed)
The original concern had been at she was listed as being on both lisinopril and her ramipril. I see from the current note that she is not on ramipril. We had increased her carvedilol when she was there. I want to increase the lisinopril also. We can increase her from 5 mg to 10 mg. And I will reassess how she's feeling in her blood pressure when she returns for her followup visit

## 2012-03-09 NOTE — Telephone Encounter (Signed)
Pt called with her meds.  They are as follows. Pravastatin 40mg  qhs Carvedilol 12.5mg  bid Lisinopril 5mg  qd ASA 325mg  qd Furosemide 40mg  bid Glipizide 5mg  bid Potassium qd  Pt states she feels well.

## 2012-03-10 NOTE — Telephone Encounter (Signed)
Pt called back and was notified.

## 2012-03-10 NOTE — Telephone Encounter (Signed)
LMTC.  N/A. 

## 2012-03-11 ENCOUNTER — Other Ambulatory Visit: Payer: Managed Care, Other (non HMO)

## 2012-03-11 ENCOUNTER — Encounter (HOSPITAL_COMMUNITY)
Admission: RE | Admit: 2012-03-11 | Discharge: 2012-03-11 | Disposition: A | Discharge status: Home / Self Care | Payer: Managed Care, Other (non HMO) | Source: Ambulatory Visit | Attending: Cardiology | Admitting: Cardiology

## 2012-03-11 DIAGNOSIS — Z5189 Encounter for other specified aftercare: Secondary | ICD-10-CM | POA: Insufficient documentation

## 2012-03-11 DIAGNOSIS — E785 Hyperlipidemia, unspecified: Secondary | ICD-10-CM | POA: Insufficient documentation

## 2012-03-11 DIAGNOSIS — Z7982 Long term (current) use of aspirin: Secondary | ICD-10-CM | POA: Insufficient documentation

## 2012-03-11 DIAGNOSIS — I509 Heart failure, unspecified: Secondary | ICD-10-CM | POA: Insufficient documentation

## 2012-03-11 DIAGNOSIS — I5022 Chronic systolic (congestive) heart failure: Secondary | ICD-10-CM | POA: Insufficient documentation

## 2012-03-11 DIAGNOSIS — I252 Old myocardial infarction: Secondary | ICD-10-CM | POA: Insufficient documentation

## 2012-03-11 DIAGNOSIS — I2589 Other forms of chronic ischemic heart disease: Secondary | ICD-10-CM | POA: Insufficient documentation

## 2012-03-11 DIAGNOSIS — E119 Type 2 diabetes mellitus without complications: Secondary | ICD-10-CM | POA: Insufficient documentation

## 2012-03-11 DIAGNOSIS — I2510 Atherosclerotic heart disease of native coronary artery without angina pectoris: Secondary | ICD-10-CM | POA: Insufficient documentation

## 2012-03-11 DIAGNOSIS — Z951 Presence of aortocoronary bypass graft: Secondary | ICD-10-CM | POA: Insufficient documentation

## 2012-03-11 NOTE — Progress Notes (Signed)
Christine Knight 61 y.o. female Nutrition Note: High Risk change to Moderate Nutrition Risk Spoke with pt.  Nutrition Plan and Nutrition Survey reviewed with pt. Pt is following Step 2 of the Therapeutic Lifestyle Changes diet. Pt is diabetic. Pt reports she checks her CBG's 2 times daily. Last A1c indicates blood glucose well-controlled. Pt is trying to lose wt and gets frustrated when she has to eat a pre-exercise snack. Given pt on Glipizide, this writer explained that her CBG's need to be >110 mg/dL before exercise. Since pt CBG's well-controlled, pt to hold Glipizide Friday 5/3 am and monitor pre- and post-exercise CBG's. Pt states she has been previously educated re: DM diet and she feels knowledgeable about her disease. Pt wants to lose wt and is keeping her kcal intake to 1200 kcal/day. This Clinical research associate discussed recommended kcal restriction. Weight loss tips discussed. Low sodium diet discussed given pt with CHF. Pt monitoring sodium intake and reports making many changes to diet since her heart procedure. Pt expressed understanding of above. Nutrition Diagnosis   Food-and nutrition-related knowledge deficit related to lack of exposure to information as related to diagnosis of: ? CVD ? DM (A1c 6.4)    Obesity related to excessive energy intake as evidenced by a BMI of 39.9  Nutrition RX/ Estimated Daily Nutrition Needs for: wt loss  1650-2150 Kcal, 45-60 gm fat, 12-16 gm sat fat, 1.6-2.1 gm trans-fat, <1500 mg sodium , 175-250 gm CHO   Nutrition Intervention   Pt's individual nutrition plan including cholesterol goals reviewed with pt.   Benefits of adopting Therapeutic Lifestyle Changes discussed when Medficts reviewed.   Pt to attend the Portion Distortion class   Pt to attend the  ? Nutrition I class                          ? Nutrition II class        ? Diabetes Blitz class                          ? Diabetes Q & A class   Pt given handouts for: ? wt loss ? DM ? low sodium    Continue  client-centered nutrition education by RD, as part of interdisciplinary care. Goal(s)   Pt to identify food quantities necessary to achieve: ? wt loss to a goal wt of 230-242 lb (104.5-110 kg) at graduation from cardiac rehab.  Monitor and Evaluate progress toward nutrition goal with team.

## 2012-03-13 ENCOUNTER — Encounter (HOSPITAL_COMMUNITY)
Admission: RE | Admit: 2012-03-13 | Discharge: 2012-03-13 | Disposition: A | Discharge status: Home / Self Care | Payer: Managed Care, Other (non HMO) | Source: Ambulatory Visit | Attending: Cardiology | Admitting: Cardiology

## 2012-03-16 ENCOUNTER — Encounter (HOSPITAL_COMMUNITY)
Admission: RE | Admit: 2012-03-16 | Discharge: 2012-03-16 | Disposition: A | Discharge status: Home / Self Care | Payer: Managed Care, Other (non HMO) | Source: Ambulatory Visit | Attending: Cardiology | Admitting: Cardiology

## 2012-03-18 ENCOUNTER — Encounter (HOSPITAL_COMMUNITY): Payer: Managed Care, Other (non HMO)

## 2012-03-19 ENCOUNTER — Ambulatory Visit: Payer: Managed Care, Other (non HMO) | Admitting: Cardiology

## 2012-03-20 ENCOUNTER — Encounter (HOSPITAL_COMMUNITY): Payer: Managed Care, Other (non HMO)

## 2012-03-23 ENCOUNTER — Encounter (HOSPITAL_COMMUNITY): Payer: Managed Care, Other (non HMO)

## 2012-03-25 ENCOUNTER — Encounter (HOSPITAL_COMMUNITY)
Admission: RE | Admit: 2012-03-25 | Discharge: 2012-03-25 | Disposition: A | Discharge status: Home / Self Care | Payer: Managed Care, Other (non HMO) | Source: Ambulatory Visit | Attending: Cardiology | Admitting: Cardiology

## 2012-03-25 ENCOUNTER — Encounter (HOSPITAL_COMMUNITY): Payer: Managed Care, Other (non HMO)

## 2012-03-27 ENCOUNTER — Encounter (HOSPITAL_COMMUNITY): Payer: Managed Care, Other (non HMO)

## 2012-03-30 ENCOUNTER — Encounter (HOSPITAL_COMMUNITY)
Admission: RE | Admit: 2012-03-30 | Discharge: 2012-03-30 | Disposition: A | Discharge status: Home / Self Care | Payer: Managed Care, Other (non HMO) | Source: Ambulatory Visit | Attending: Cardiology | Admitting: Cardiology

## 2012-03-30 ENCOUNTER — Encounter (HOSPITAL_COMMUNITY): Payer: Managed Care, Other (non HMO)

## 2012-03-30 LAB — GLUCOSE, CAPILLARY
Glucose-Capillary: 82 mg/dL (ref 70–99)
Glucose-Capillary: 84 mg/dL (ref 70–99)
Glucose-Capillary: 107 mg/dL — ABNORMAL HIGH (ref 70–99)

## 2012-03-30 NOTE — Progress Notes (Signed)
Brief Nutrition Note Pt CBG 82 mg/dL, which is too low for exercise. Pt last ate at 10 am. Pt is aware of the need to eat before exercise. Pt returned to work last week and is struggling with managing her personal expectations of herself at work and her energy level. Pt stated, "I couldn't even go into work on Friday I felt so sick." Pt encouraged to pace herself and apply some of the stress management techniques she has learned in Cardiac Rehab. Pt reports her boss is very understanding and supportive of her medical situation and is willing to work with the pt re: hours worked. Continue client-centered nutrition education by RD as part of interdisciplinary care.  Monitor and evaluate progress toward nutrition goal with team.

## 2012-03-30 NOTE — Progress Notes (Signed)
Blood sugar initially 84. Lazette was given peanut butter graham crackers and tang repeat blood sugar 82 15 minutes later, Britta Mccreedy did not exercise today per protocol. Breeana ate breakfast at 1000 am and was counseled by the dietitian. Daysie was given ginger ale.  Repeat blood sugar 107.  Beyonca left cardiac rehab without complaints and plans to go home and eat a meal.

## 2012-04-01 ENCOUNTER — Encounter (HOSPITAL_COMMUNITY): Admission: RE | Admit: 2012-04-01 | Payer: Managed Care, Other (non HMO) | Source: Ambulatory Visit

## 2012-04-01 ENCOUNTER — Encounter (HOSPITAL_COMMUNITY): Payer: Managed Care, Other (non HMO)

## 2012-04-03 ENCOUNTER — Encounter (HOSPITAL_COMMUNITY): Payer: Managed Care, Other (non HMO)

## 2012-04-06 ENCOUNTER — Encounter (HOSPITAL_COMMUNITY): Payer: Managed Care, Other (non HMO)

## 2012-04-08 ENCOUNTER — Encounter (HOSPITAL_COMMUNITY): Payer: Managed Care, Other (non HMO)

## 2012-04-10 ENCOUNTER — Encounter (HOSPITAL_COMMUNITY): Payer: Managed Care, Other (non HMO)

## 2012-04-13 ENCOUNTER — Encounter (HOSPITAL_COMMUNITY): Payer: Managed Care, Other (non HMO)

## 2012-04-15 ENCOUNTER — Encounter (HOSPITAL_COMMUNITY): Payer: Managed Care, Other (non HMO)

## 2012-04-17 ENCOUNTER — Encounter (HOSPITAL_COMMUNITY): Payer: Managed Care, Other (non HMO)

## 2012-04-20 ENCOUNTER — Encounter (HOSPITAL_COMMUNITY): Payer: Managed Care, Other (non HMO)

## 2012-04-22 ENCOUNTER — Encounter (HOSPITAL_COMMUNITY): Payer: Managed Care, Other (non HMO)

## 2012-04-23 ENCOUNTER — Encounter (HOSPITAL_COMMUNITY): Payer: Self-pay | Admitting: *Deleted

## 2012-04-23 NOTE — Progress Notes (Deleted)
Subjective:      Patient ID: Christine Knight is a 61 y.o. female.  Chief Complaint: HPI {Common ambulatory SmartLinks:19316} ROS    Objective:    Physical Exam  Lab Review:  {Recent labs:19471::"not applicable"}    Assessment:     No diagnosis found.   Plan:     ***

## 2012-04-23 NOTE — Progress Notes (Signed)
Christine Knight has not been to exercise at cardiac rehab since 03/30/2012.  Will discharge the patient from the program from the program at this time for nonattendance.

## 2012-04-24 ENCOUNTER — Encounter (HOSPITAL_COMMUNITY): Payer: Managed Care, Other (non HMO)

## 2012-04-27 ENCOUNTER — Encounter (HOSPITAL_COMMUNITY): Payer: Managed Care, Other (non HMO)

## 2012-04-27 NOTE — Progress Notes (Signed)
Cardiac Rehabilitation Program Outcomes Report Orientation:  02/14/2012 Graduate Date:   Discharge Date:  04/25/2012 # of sessions completed: 10  Cardiologist: Adline Potter MD:  Ellan Lambert Time:  1315  A.  Exercise Program:  Tolerates exercise @ 2.3 METS for 30 minutes, Poor attendance due to work schedule and Discharged  B.  Mental Health:   C.  Education/Instruction/Skills  Uses Perceived Exertion Scale   D.  Nutrition/Weight Control/Body Composition:  Patient has lost 1.3 kg Pt following a step 2 Therapeutic Lifestyle Changes diet on admission. Section Completed by: Mickle Plumb, M.Ed, RD, LDN, CDE   E.  Blood Lipids  Lab Results  Component Value Date   CHOL 174 01/30/2012   HDL 51.00 01/30/2012   LDLCALC 112* 01/30/2012   TRIG 57.0 01/30/2012   CHOLHDL 3 01/30/2012   F.  Lifestyle Changes:   G.  Symptoms noted with exercise:  Hypoglycemic  Report Completed By:  Electronically signed by Harriett Sine MS on Monday April 27 2012 at 0646  Comments:

## 2012-04-29 ENCOUNTER — Encounter (HOSPITAL_COMMUNITY): Payer: Managed Care, Other (non HMO)

## 2012-05-01 ENCOUNTER — Encounter (HOSPITAL_COMMUNITY): Payer: Managed Care, Other (non HMO)

## 2012-05-04 ENCOUNTER — Encounter (HOSPITAL_COMMUNITY): Payer: Managed Care, Other (non HMO)

## 2012-05-06 ENCOUNTER — Encounter (HOSPITAL_COMMUNITY): Payer: Managed Care, Other (non HMO)

## 2012-05-06 ENCOUNTER — Ambulatory Visit: Payer: Managed Care, Other (non HMO) | Admitting: Cardiology

## 2012-05-07 ENCOUNTER — Ambulatory Visit: Payer: Managed Care, Other (non HMO) | Admitting: Cardiology

## 2012-05-08 ENCOUNTER — Encounter (HOSPITAL_COMMUNITY): Payer: Managed Care, Other (non HMO)

## 2012-05-11 ENCOUNTER — Encounter (HOSPITAL_COMMUNITY): Payer: Managed Care, Other (non HMO)

## 2012-05-13 ENCOUNTER — Encounter (HOSPITAL_COMMUNITY): Payer: Managed Care, Other (non HMO)

## 2012-05-15 ENCOUNTER — Encounter (HOSPITAL_COMMUNITY): Payer: Managed Care, Other (non HMO)

## 2012-05-18 ENCOUNTER — Encounter (HOSPITAL_COMMUNITY): Payer: Managed Care, Other (non HMO)

## 2012-05-20 ENCOUNTER — Encounter (HOSPITAL_COMMUNITY): Payer: Managed Care, Other (non HMO)

## 2012-05-22 ENCOUNTER — Encounter (HOSPITAL_COMMUNITY): Payer: Managed Care, Other (non HMO)

## 2012-05-29 ENCOUNTER — Ambulatory Visit: Payer: Managed Care, Other (non HMO) | Admitting: Cardiology

## 2012-07-21 ENCOUNTER — Ambulatory Visit (INDEPENDENT_AMBULATORY_CARE_PROVIDER_SITE_OTHER): Payer: Managed Care, Other (non HMO) | Admitting: Cardiology

## 2012-07-21 ENCOUNTER — Encounter: Payer: Self-pay | Admitting: Cardiology

## 2012-07-21 ENCOUNTER — Other Ambulatory Visit: Payer: Self-pay | Admitting: Internal Medicine

## 2012-07-21 VITALS — BP 122/84 | HR 64 | Resp 18 | Ht 67.0 in | Wt 256.4 lb

## 2012-07-21 DIAGNOSIS — I255 Ischemic cardiomyopathy: Secondary | ICD-10-CM

## 2012-07-21 DIAGNOSIS — I5022 Chronic systolic (congestive) heart failure: Secondary | ICD-10-CM

## 2012-07-21 DIAGNOSIS — I2589 Other forms of chronic ischemic heart disease: Secondary | ICD-10-CM

## 2012-07-21 DIAGNOSIS — I2510 Atherosclerotic heart disease of native coronary artery without angina pectoris: Secondary | ICD-10-CM

## 2012-07-21 MED ORDER — LISINOPRIL 10 MG PO TABS: 10.0000 mg | ORAL_TABLET | Freq: Every day | ORAL | Status: DC

## 2012-07-21 NOTE — Assessment & Plan Note (Signed)
We've been able to titrate her medications but not optimally. Her ACE inhibitor dose will be increased. For her overall fatigue we will proceed with a followup 2-D echo to reassess LV function. In addition I will obtain a CBC and chemistry, thyroid functions and CPK to be sure she is not having problems with her statin. 2-D echo will be obtained. I will then be in touch with her with information to plan the next step.

## 2012-07-21 NOTE — Addendum Note (Signed)
Addended by: Demetrios Loll on: 07/21/2012 05:23 PM   Modules accepted: Orders

## 2012-07-21 NOTE — Assessment & Plan Note (Signed)
The patient is post CABG from October, 2012. There is no evidence that she's having ongoing ischemia.

## 2012-07-21 NOTE — Progress Notes (Signed)
HPI  Patient is seen to followup CHF. She had an MI and CABG in October, 2012. Unfortunately she had severe left ventricular dysfunction. Echo was repeated in December, 2012. Her ejection fraction remains in the 25% range. I've tried to titrate her medications. We have not gotten as far as I would like. She feels fatigued. She says that normally she does not have edema. She has not had PND orthopnea. She has an overall general fatigue.  I reviewed the other records in this electronically record. We had hoped that she could go to rehabilitation but she was not able to do that. No Known Allergies  Current Outpatient Prescriptions  Medication Sig Dispense Refill  . ACCU-CHEK SMARTVIEW test strip       . aspirin 325 MG EC tablet Take 325 mg by mouth daily.        . carvedilol (COREG) 12.5 MG tablet Take 1 tablet (12.5 mg total) by mouth 2 (two) times daily.  60 tablet  11  . furosemide (LASIX) 40 MG tablet Take 1 tablet (40 mg total) by mouth 2 (two) times daily.  180 tablet  3  . glipiZIDE (GLUCOTROL) 5 MG tablet TAKE ONE TABLET BY MOUTH TWICE DAILY WITH MEALS  60 tablet  0  . lisinopril (PRINIVIL,ZESTRIL) 5 MG tablet Take 1 tablet (5 mg total) by mouth daily.  30 tablet  11  . potassium chloride SA (K-DUR,KLOR-CON) 20 MEQ tablet Take 1 tablet (20 mEq total) by mouth daily.  30 tablet  11  . pravastatin (PRAVACHOL) 40 MG tablet Take 1 tablet (40 mg total) by mouth daily.  30 tablet  11  . DISCONTD: glipiZIDE (GLUCOTROL) 5 MG tablet TAKE 1 TABLET BY MOUTH TWICE DAILY WITH MEALS  180 tablet  1  . DISCONTD: simvastatin (ZOCOR) 20 MG tablet Take 1 tablet (20 mg total) by mouth every evening.  90 tablet  1    History   Social History  . Marital Status: Divorced    Spouse Name: N/A    Number of Children: N/A  . Years of Education: N/A   Occupational History  . Not on file.   Social History Main Topics  . Smoking status: Former Smoker    Types: Cigarettes    Quit date: 11/11/1978  .  Smokeless tobacco: Never Used   Comment: "only a social smoker when I did smoke"  . Alcohol Use: Yes     11/13/11 "in the last 6 months I've had 1 drink"  . Drug Use: No  . Sexually Active: No   Other Topics Concern  . Not on file   Social History Narrative  . No narrative on file    Family History  Problem Relation Age of Onset  . Heart disease Father   . Cancer Neg Hx   . Diabetes Neg Hx   . Hypertension Neg Hx   . Kidney disease Neg Hx     Past Medical History  Diagnosis Date  . Obesity, morbid   . Chronic systolic heart failure     Chronic systolic CHF  . History of tobacco abuse   . Hyperlipidemia 08/10/11  . Diabetes mellitus type 2 in obese 08/10/11  . CAD (coronary artery disease) 08/2011    NSTEMI with subsequent CABG 08/13/2011 with LIMA-LAD, SVG-diagonal, SVG-OM, SVG-PDA  . Ischemic cardiomyopathy 08/13/2011    EF 20% in 08/2011, still 25% 10/21/2011  . Pleural effusion     Requiring L thoracentesis 09/02/11  . Mitral regurgitation  Mild by echo 10/21/11  . Carpal tunnel syndrome on both sides     "since OHS 08/2011"  . Hypertension   . Hx of CABG     October, 2012    Past Surgical History  Procedure Date  . Coronary artery bypass graft 08/13/11    CABG x4 with LIMA to LAD, SVG to Diag, SVG to OM, SVG to PDA, EVH via both thighs  . Gallstones removed laparoscopy ~ 2000    ROS   Patient denies fever, chills, headache, sweats, rash, change in vision, change in hearing, chest pain, cough, nausea vomiting, urinary symptoms. All other systems are reviewed and are negative.  PHYSICAL EXAM  Patient is oriented to person time and place. Affect is normal. Lungs are clear. Respiratory effort is nonlabored. There is no jugulovenous distention. Cardiac exam reveals S1 and S2. There no clicks or significant murmurs. The abdomen is obese but soft. She does have 1+ peripheral edema today. She says that she has this is a day goes on and then it disappears at nighttime.  There no musculoskeletal deformities. There are no skin rashes.  Filed Vitals:   07/21/12 1559  BP: 122/84  Pulse: 64  Resp: 18  Height: 5\' 7"  (1.702 m)  Weight: 256 lb 6.4 oz (116.302 kg)  SpO2: 94%    EKG is done today and reviewed by me. There is sinus rhythm. There is decreased anterior R wave progression. The QRS duration is 104 ms.  ASSESSMENT & PLAN

## 2012-07-21 NOTE — Patient Instructions (Signed)
Your physician recommends that you return for lab work in: today  Your physician has requested that you have an echocardiogram. Echocardiography is a painless test that uses sound waves to create images of your heart. It provides your doctor with information about the size and shape of your heart and how well your heart's chambers and valves are working. This procedure takes approximately one hour. There are no restrictions for this procedure.  Your physician wants you to follow-up in: 5 weeks. You will receive a reminder letter in the mail two months in advance. If you don't receive a letter, please call our office to schedule the follow-up appointment.

## 2012-07-21 NOTE — Assessment & Plan Note (Signed)
The patient is mildly volume overloaded today. She does that she had some ham that she boiled recently. She says that this is very infrequent. She says that the edema that she has today is more than usual. I decided not to change her diuretic dose today.

## 2012-07-22 LAB — CBC WITH DIFFERENTIAL/PLATELET
Basophils Relative: 0.5 % (ref 0.0–3.0)
Eosinophils Relative: 4.7 % (ref 0.0–5.0)
Lymphocytes Relative: 31 % (ref 12.0–46.0)
Monocytes Relative: 8.1 % (ref 3.0–12.0)
Neutrophils Relative %: 55.7 % (ref 43.0–77.0)
RBC: 4.45 Mil/uL (ref 3.87–5.11)
WBC: 5.4 10*3/uL (ref 4.5–10.5)

## 2012-07-22 LAB — BASIC METABOLIC PANEL
CO2: 24 mEq/L (ref 19–32)
Calcium: 9.5 mg/dL (ref 8.4–10.5)
GFR: 117.1 mL/min (ref >60.00)
Sodium: 136 mEq/L (ref 135–145)

## 2012-07-22 LAB — TSH: TSH: 1.38 u[IU]/mL (ref 0.35–5.50)

## 2012-07-23 ENCOUNTER — Encounter: Payer: Self-pay | Admitting: Cardiology

## 2012-07-23 NOTE — Progress Notes (Addendum)
   Labs were checked in the office yesterday when the patient was here. Renal function hemoglobin and TSH are normal. Total CPK is 183 with top normal 177. It would seem unlikely that she is having any type of abnormality that is significant with this trivial elevation. Her CPKs over the past several months however after she recovered from her MI, or in the 120-150 range. This data will be kept in mind.  I will have her hold her Pravachol for several weeks to see if this makes any difference.

## 2012-07-28 ENCOUNTER — Ambulatory Visit (HOSPITAL_COMMUNITY): Payer: Managed Care, Other (non HMO) | Attending: Cardiovascular Disease | Admitting: Radiology

## 2012-07-28 DIAGNOSIS — I255 Ischemic cardiomyopathy: Secondary | ICD-10-CM

## 2012-07-28 DIAGNOSIS — I2589 Other forms of chronic ischemic heart disease: Secondary | ICD-10-CM | POA: Insufficient documentation

## 2012-07-28 DIAGNOSIS — I509 Heart failure, unspecified: Secondary | ICD-10-CM | POA: Insufficient documentation

## 2012-07-28 DIAGNOSIS — E119 Type 2 diabetes mellitus without complications: Secondary | ICD-10-CM | POA: Insufficient documentation

## 2012-07-28 DIAGNOSIS — I2510 Atherosclerotic heart disease of native coronary artery without angina pectoris: Secondary | ICD-10-CM | POA: Insufficient documentation

## 2012-07-28 DIAGNOSIS — I369 Nonrheumatic tricuspid valve disorder, unspecified: Secondary | ICD-10-CM | POA: Insufficient documentation

## 2012-07-28 DIAGNOSIS — I379 Nonrheumatic pulmonary valve disorder, unspecified: Secondary | ICD-10-CM | POA: Insufficient documentation

## 2012-07-28 DIAGNOSIS — I059 Rheumatic mitral valve disease, unspecified: Secondary | ICD-10-CM | POA: Insufficient documentation

## 2012-07-28 NOTE — Addendum Note (Signed)
Addended by: Demetrios Loll on: 07/28/2012 10:47 AM   Modules accepted: Orders

## 2012-07-28 NOTE — Progress Notes (Signed)
Echocardiogram performed.  

## 2012-07-31 ENCOUNTER — Telehealth: Payer: Self-pay | Admitting: Cardiology

## 2012-07-31 NOTE — Telephone Encounter (Signed)
Pt was notified of lab results.  She states she will hold her pravachol x 3 weeks as requested by Dr Myrtis Ser due to an elevated CPK.  She states she has been having muscle pain x 4 weeks after helping her daughter move.

## 2012-07-31 NOTE — Telephone Encounter (Signed)
New problem    Returning call back to nurse.   

## 2012-07-31 NOTE — Telephone Encounter (Signed)
N/A.  LMTC. 

## 2012-08-05 ENCOUNTER — Other Ambulatory Visit: Payer: Self-pay | Admitting: *Deleted

## 2012-08-05 MED ORDER — GLIPIZIDE 5 MG PO TABS: 5.0000 mg | ORAL_TABLET | Freq: Two times a day (BID) | ORAL | Status: DC

## 2012-08-07 ENCOUNTER — Other Ambulatory Visit: Payer: Self-pay | Admitting: *Deleted

## 2012-08-07 DIAGNOSIS — I2510 Atherosclerotic heart disease of native coronary artery without angina pectoris: Secondary | ICD-10-CM

## 2012-08-07 DIAGNOSIS — I255 Ischemic cardiomyopathy: Secondary | ICD-10-CM

## 2012-08-07 DIAGNOSIS — I5022 Chronic systolic (congestive) heart failure: Secondary | ICD-10-CM

## 2012-08-07 MED ORDER — FUROSEMIDE 40 MG PO TABS: 40.0000 mg | ORAL_TABLET | Freq: Two times a day (BID) | ORAL | Status: DC

## 2012-08-07 MED ORDER — POTASSIUM CHLORIDE CRYS ER 20 MEQ PO TBCR: 20.0000 meq | EXTENDED_RELEASE_TABLET | Freq: Every day | ORAL | Status: DC

## 2012-08-07 MED ORDER — CARVEDILOL 12.5 MG PO TABS: 12.5000 mg | ORAL_TABLET | Freq: Two times a day (BID) | ORAL | Status: DC

## 2012-08-07 MED ORDER — LISINOPRIL 10 MG PO TABS: 10.0000 mg | ORAL_TABLET | Freq: Every day | ORAL | Status: DC

## 2012-08-10 ENCOUNTER — Ambulatory Visit: Payer: Managed Care, Other (non HMO) | Admitting: Internal Medicine

## 2012-08-10 ENCOUNTER — Telehealth: Payer: Self-pay | Admitting: Cardiology

## 2012-08-10 DIAGNOSIS — Z0289 Encounter for other administrative examinations: Secondary | ICD-10-CM

## 2012-08-10 NOTE — Telephone Encounter (Signed)
Pt returning nurse Larita Fife call, she can be reached at hm# [phone]

## 2012-08-10 NOTE — Telephone Encounter (Signed)
**Note De-Identified Christine Knight Obfuscation** Pt. Given Echo results, she verbalized understanding./LV

## 2012-08-20 ENCOUNTER — Telehealth: Payer: Self-pay | Admitting: *Deleted

## 2012-08-20 NOTE — Telephone Encounter (Signed)
Patient walked in thinking her appointment was today with Dr Myrtis Ser, he is not in the office. Patient stated she was still not feeling well, fatigue and shortness of breath. Reviewed last office note with Dr Myrtis Ser and this seemed to be going on at last visit. Did ask patient if she was feeling worse or about the same as at her last office visit with Dr Myrtis Ser and she stated no worse about the same.  Did get her weight today and it was up from 256 to 261.8 she stated it was from her not being active.  Patient states does have some swelling in her feet after working all day (worked today) but when she goes home and elevates it does go down. Did schedule her an appointment with Dr Myrtis Ser for Monday which was sooner than her original appointment.  Advised to go to emergency department if worse.  Patient was ok with this and verbalized understanding. Will forward to Dr Myrtis Ser so he will be aware prior to her appointment

## 2012-08-21 ENCOUNTER — Encounter: Payer: Self-pay | Admitting: Cardiology

## 2012-08-21 DIAGNOSIS — I272 Pulmonary hypertension, unspecified: Secondary | ICD-10-CM | POA: Insufficient documentation

## 2012-08-21 NOTE — Telephone Encounter (Signed)
OK 

## 2012-08-24 ENCOUNTER — Ambulatory Visit (INDEPENDENT_AMBULATORY_CARE_PROVIDER_SITE_OTHER): Payer: Managed Care, Other (non HMO) | Admitting: Cardiology

## 2012-08-24 ENCOUNTER — Encounter: Payer: Self-pay | Admitting: Cardiology

## 2012-08-24 VITALS — BP 114/79 | HR 68 | Ht 67.0 in | Wt 267.1 lb

## 2012-08-24 DIAGNOSIS — I272 Pulmonary hypertension, unspecified: Secondary | ICD-10-CM

## 2012-08-24 DIAGNOSIS — I059 Rheumatic mitral valve disease, unspecified: Secondary | ICD-10-CM

## 2012-08-24 DIAGNOSIS — I2789 Other specified pulmonary heart diseases: Secondary | ICD-10-CM

## 2012-08-24 DIAGNOSIS — E785 Hyperlipidemia, unspecified: Secondary | ICD-10-CM

## 2012-08-24 DIAGNOSIS — I2589 Other forms of chronic ischemic heart disease: Secondary | ICD-10-CM

## 2012-08-24 DIAGNOSIS — I5022 Chronic systolic (congestive) heart failure: Secondary | ICD-10-CM

## 2012-08-24 DIAGNOSIS — I340 Nonrheumatic mitral (valve) insufficiency: Secondary | ICD-10-CM

## 2012-08-24 DIAGNOSIS — I255 Ischemic cardiomyopathy: Secondary | ICD-10-CM

## 2012-08-24 DIAGNOSIS — I2510 Atherosclerotic heart disease of native coronary artery without angina pectoris: Secondary | ICD-10-CM

## 2012-08-24 DIAGNOSIS — D649 Anemia, unspecified: Secondary | ICD-10-CM

## 2012-08-24 MED ORDER — FUROSEMIDE 40 MG PO TABS: ORAL_TABLET | ORAL | Status: DC

## 2012-08-24 NOTE — Assessment & Plan Note (Signed)
The patient is post CABG. At this time I do not think she is having any significant ischemia.

## 2012-08-24 NOTE — Assessment & Plan Note (Signed)
Unfortunately she is having ongoing symptoms of congestive heart failure. I do believe that some of her exertional shortness of breath and fatigue are related to this. My plan is to increase her diuretic. In addition she does admit that she drinks excess fluid. I am hopeful that with further adjustments we can improve how she feels. However I did make it clear to her that we may not be able to make her feel a great deal better. She is able to do her work. However she is shortness of breath when she is going to and from her work.

## 2012-08-24 NOTE — Assessment & Plan Note (Signed)
Historically there was some anemia. Her most recent hemoglobin was 14. This is quite good and no further workup is needed.

## 2012-08-24 NOTE — Patient Instructions (Addendum)
Your physician recommends that you schedule a follow-up appointment in: 1 week  Your physician has recommended you make the following change in your medication: INCREASE your lasix to 80mg  twice daily for 3 days and then DECREASE to 80mg  am and 40mg  pm.   RESTART your Pravachol at 40mg  daily  Decrease your intake of oral fluids  Elevate your legs above the level of your heart as much as possible when not at work

## 2012-08-24 NOTE — Assessment & Plan Note (Signed)
We have put her statin on hold wondering if she was having any myalgias from the medication. She does not feel any better in this regard. Therefore her statin will be restarted.

## 2012-08-24 NOTE — Progress Notes (Signed)
Patient ID: Christine Knight, female   DOB: 16-Apr-1951, 61 y.o.   MRN: [id number]   HPI:   Patient is seen for followup coronary disease and CHF. I saw her last July 21, 2012. At that time we drew labs. Her renal function is stable. Thyroid was normal and hemoglobin was normal. There was a question of whether she could be having problems from statins. Total CPK was very slightly elevated. We put her statin on hold. She does not think that this helped significantly. Overall she is fatigued. She is not able to exercise because of shortness of breath. She's not having PND or orthopnea. She's not having any significant chest pain. He followup two-dimensional echo was done. Her ejection fraction is 35%. This is actually somewhat increased for her. However she does have significant pulmonary hypertension with her PA pressure in the range of 72 mmHg. This is higher than the past. I wonder if this measurement was somewhat based on her volume status at this time.   No Known Allergies  Current Outpatient Prescriptions  Medication Sig Dispense Refill  . ACCU-CHEK SMARTVIEW test strip       . aspirin 325 MG EC tablet Take 325 mg by mouth daily.        . carvedilol (COREG) 12.5 MG tablet Take 1 tablet (12.5 mg total) by mouth 2 (two) times daily.  180 tablet  3  . furosemide (LASIX) 40 MG tablet Take 1 tablet (40 mg total) by mouth 2 (two) times daily.  180 tablet  3  . glipiZIDE (GLUCOTROL) 5 MG tablet Take 1 tablet (5 mg total) by mouth 2 (two) times daily before a meal.  180 tablet  3  . lisinopril (PRINIVIL,ZESTRIL) 10 MG tablet Take 1 tablet (10 mg total) by mouth daily.  90 tablet  3  . potassium chloride SA (K-DUR,KLOR-CON) 20 MEQ tablet Take 1 tablet (20 mEq total) by mouth daily.  90 tablet  3  . pravastatin (PRAVACHOL) 40 MG tablet Take 1 tablet (40 mg total) by mouth daily.  30 tablet  11  . DISCONTD: simvastatin (ZOCOR) 20 MG tablet Take 1 tablet (20 mg total) by mouth every evening.  90 tablet   1    History   Social History  . Marital Status: Divorced    Spouse Name: N/A    Number of Children: N/A  . Years of Education: N/A   Occupational History  . Not on file.   Social History Main Topics  . Smoking status: Former Smoker    Types: Cigarettes    Quit date: 11/11/1978  . Smokeless tobacco: Never Used   Comment: "only a social smoker when I did smoke"  . Alcohol Use: Yes     11/13/11 "in the last 6 months I've had 1 drink"  . Drug Use: No  . Sexually Active: No   Other Topics Concern  . Not on file   Social History Narrative  . No narrative on file    Family History  Problem Relation Age of Onset  . Heart disease Father   . Cancer Neg Hx   . Diabetes Neg Hx   . Hypertension Neg Hx   . Kidney disease Neg Hx     Past Medical History  Diagnosis Date  . Obesity, morbid   . Chronic systolic heart failure     Chronic systolic CHF  . History of tobacco abuse   . Hyperlipidemia 08/10/11  . Diabetes mellitus type 2 in obese 08/10/11  .  CAD (coronary artery disease) 08/2011    NSTEMI with subsequent CABG 08/13/2011 with LIMA-LAD, SVG-diagonal, SVG-OM, SVG-PDA  . Ischemic cardiomyopathy 08/13/2011    EF 20% in 08/2011, still 25% 10/21/2011  . Pleural effusion     Requiring L thoracentesis 09/02/11  . Mitral regurgitation     Mild by echo 10/21/11  . Carpal tunnel syndrome on both sides     "since OHS 08/2011"  . Hypertension   . Hx of CABG     October, 2012  . Pulmonary hypertension     Echo, September, 2013, 72 mmHg.  Marland Kitchen Ejection fraction < 50%     EF 20%, October, 2012  //  EF 25% December, 2012  //   EF 35%, echo, September, 2013    Past Surgical History  Procedure Date  . Coronary artery bypass graft 08/13/11    CABG x4 with LIMA to LAD, SVG to Diag, SVG to OM, SVG to PDA, EVH via both thighs  . Gallstones removed laparoscopy ~ 2000    Patient Active Problem List  Diagnosis  . Obesity, morbid  . Hyperlipidemia  . Diabetes mellitus type 2 in  obese  . CAD (coronary artery disease)  . Anemia  . CTS (carpal tunnel syndrome)  . S/P CABG x 4  . Chronic systolic heart failure  . Ischemic cardiomyopathy  . Mitral regurgitation  . Pulmonary hypertension  . Ejection fraction < 50%    ROS   Patient denies fever, chills, headache, sweats, rash, change in vision, change in hearing, chest pain, cough, nausea vomiting, urinary symptoms. All other systems are reviewed and are negative very  PHYSICAL EXAM    Patient is overweight. She is tearful and that she hopes to be feeling better. She asks what she can do. She is oriented to person time and place. Affect is normal. There is no jugular venous distention. Lungs reveal a few scattered rhonchi. Cardiac exam reveals S1 and S2. There are no clicks or significant murmurs. The abdomen is soft. She does have 1+ peripheral edema. There are no musculoskeletal deformities. There are no skin rashes.  Filed Vitals:   08/24/12 0908  BP: 114/79  Pulse: 68  Height: 5\' 7"  (1.702 m)  Weight: 267 lb 1.6 oz (121.156 kg)  SpO2: 97%     ASSESSMENT & PLAN

## 2012-08-24 NOTE — Assessment & Plan Note (Signed)
Her PA pressure was higher than it had been at the time of her last echo. I am hoping that this will improve with better fluid management.

## 2012-08-24 NOTE — Assessment & Plan Note (Signed)
There has been mild mitral regurgitation. No further workup at this time.

## 2012-08-24 NOTE — Assessment & Plan Note (Signed)
The patient is overweight. She would like to try to lose some weight. However she feels she cannot be active because she has shortness of breath.

## 2012-08-24 NOTE — Assessment & Plan Note (Signed)
Currently she is on carvedilol 12.5 twice a day. She is on lisinopril 10 mg daily. She is on some potassium. The next step will be to adjust her diuretic. Then I will see her back soon and we will recheck her renal function and decide if we can push her beta blocker and ACE inhibitor further. After that I will  Consider spironolactone. The patient's ejection fraction has improved somewhat. Therefore I have not begun in the discussion about the possibility of whether or not she needs an ICD or other intervention.

## 2012-09-01 ENCOUNTER — Ambulatory Visit: Payer: Managed Care, Other (non HMO) | Admitting: Cardiology

## 2012-10-06 ENCOUNTER — Ambulatory Visit: Payer: Managed Care, Other (non HMO) | Admitting: Cardiology

## 2012-10-14 ENCOUNTER — Encounter: Payer: Self-pay | Admitting: Cardiology

## 2012-10-30 ENCOUNTER — Other Ambulatory Visit: Payer: Self-pay

## 2012-10-30 DIAGNOSIS — I2510 Atherosclerotic heart disease of native coronary artery without angina pectoris: Secondary | ICD-10-CM

## 2012-10-30 MED ORDER — PRAVASTATIN SODIUM 40 MG PO TABS: 40.0000 mg | ORAL_TABLET | Freq: Every day | ORAL | Status: DC

## 2013-03-17 ENCOUNTER — Encounter (HOSPITAL_COMMUNITY): Payer: Self-pay | Admitting: *Deleted

## 2013-03-17 ENCOUNTER — Inpatient Hospital Stay (HOSPITAL_COMMUNITY)
Admission: EM | Admit: 2013-03-17 | Discharge: 2013-03-25 | DRG: 291 | Disposition: A | Discharge status: Home / Self Care | Payer: Managed Care, Other (non HMO) | Attending: Cardiovascular Disease | Admitting: Cardiovascular Disease

## 2013-03-17 ENCOUNTER — Emergency Department (HOSPITAL_COMMUNITY): Payer: Managed Care, Other (non HMO)

## 2013-03-17 DIAGNOSIS — E1140 Type 2 diabetes mellitus with diabetic neuropathy, unspecified: Secondary | ICD-10-CM | POA: Diagnosis present

## 2013-03-17 DIAGNOSIS — I272 Pulmonary hypertension, unspecified: Secondary | ICD-10-CM | POA: Diagnosis present

## 2013-03-17 DIAGNOSIS — R109 Unspecified abdominal pain: Secondary | ICD-10-CM

## 2013-03-17 DIAGNOSIS — I255 Ischemic cardiomyopathy: Secondary | ICD-10-CM

## 2013-03-17 DIAGNOSIS — Z79899 Other long term (current) drug therapy: Secondary | ICD-10-CM

## 2013-03-17 DIAGNOSIS — I2789 Other specified pulmonary heart diseases: Secondary | ICD-10-CM | POA: Diagnosis present

## 2013-03-17 DIAGNOSIS — I059 Rheumatic mitral valve disease, unspecified: Secondary | ICD-10-CM | POA: Diagnosis present

## 2013-03-17 DIAGNOSIS — I2589 Other forms of chronic ischemic heart disease: Secondary | ICD-10-CM | POA: Diagnosis present

## 2013-03-17 DIAGNOSIS — E1169 Type 2 diabetes mellitus with other specified complication: Secondary | ICD-10-CM

## 2013-03-17 DIAGNOSIS — J9620 Acute and chronic respiratory failure, unspecified whether with hypoxia or hypercapnia: Secondary | ICD-10-CM | POA: Diagnosis present

## 2013-03-17 DIAGNOSIS — Z6841 Body Mass Index (BMI) 40.0 and over, adult: Secondary | ICD-10-CM

## 2013-03-17 DIAGNOSIS — I509 Heart failure, unspecified: Secondary | ICD-10-CM | POA: Diagnosis present

## 2013-03-17 DIAGNOSIS — Z87891 Personal history of nicotine dependence: Secondary | ICD-10-CM

## 2013-03-17 DIAGNOSIS — Z7982 Long term (current) use of aspirin: Secondary | ICD-10-CM

## 2013-03-17 DIAGNOSIS — Z951 Presence of aortocoronary bypass graft: Secondary | ICD-10-CM

## 2013-03-17 DIAGNOSIS — IMO0001 Reserved for inherently not codable concepts without codable children: Secondary | ICD-10-CM | POA: Diagnosis present

## 2013-03-17 DIAGNOSIS — I10 Essential (primary) hypertension: Secondary | ICD-10-CM | POA: Diagnosis present

## 2013-03-17 DIAGNOSIS — E662 Morbid (severe) obesity with alveolar hypoventilation: Secondary | ICD-10-CM | POA: Diagnosis present

## 2013-03-17 DIAGNOSIS — I5022 Chronic systolic (congestive) heart failure: Secondary | ICD-10-CM

## 2013-03-17 DIAGNOSIS — I252 Old myocardial infarction: Secondary | ICD-10-CM

## 2013-03-17 DIAGNOSIS — I5023 Acute on chronic systolic (congestive) heart failure: Principal | ICD-10-CM | POA: Diagnosis present

## 2013-03-17 DIAGNOSIS — E785 Hyperlipidemia, unspecified: Secondary | ICD-10-CM | POA: Diagnosis present

## 2013-03-17 DIAGNOSIS — D649 Anemia, unspecified: Secondary | ICD-10-CM

## 2013-03-17 DIAGNOSIS — E039 Hypothyroidism, unspecified: Secondary | ICD-10-CM | POA: Diagnosis present

## 2013-03-17 DIAGNOSIS — I2510 Atherosclerotic heart disease of native coronary artery without angina pectoris: Secondary | ICD-10-CM | POA: Diagnosis present

## 2013-03-17 HISTORY — DX: Orthopnea: R06.01

## 2013-03-17 LAB — GLUCOSE, CAPILLARY: Glucose-Capillary: 97 mg/dL (ref 70–99)

## 2013-03-17 LAB — CBC
HCT: 39.5 % (ref 36.0–46.0)
MCH: 30.8 pg (ref 26.0–34.0)
MCH: 30.6 pg (ref 26.0–34.0)
MCV: 89.6 fL (ref 78.0–100.0)
MCV: 90.1 fL (ref 78.0–100.0)
Platelets: 220 10*3/uL (ref 150–400)
Platelets: 243 10*3/uL (ref 150–400)
RBC: 4.41 MIL/uL (ref 3.87–5.11)
RDW: 15 % (ref 11.5–15.5)
RDW: 14.8 % (ref 11.5–15.5)
WBC: 4.8 10*3/uL (ref 4.0–10.5)
WBC: 5.6 10*3/uL (ref 4.0–10.5)

## 2013-03-17 LAB — BASIC METABOLIC PANEL
BUN: 19 mg/dL (ref 6–23)
Chloride: 106 mEq/L (ref 96–112)
Creatinine, Ser: 0.79 mg/dL (ref 0.50–1.10)
GFR calc Af Amer: >90 mL/min (ref >90)
Glucose, Bld: 100 mg/dL — ABNORMAL HIGH (ref 70–99)
Potassium: 4.4 mEq/L (ref 3.5–5.1)

## 2013-03-17 LAB — CREATININE, SERUM: Creatinine, Ser: 0.81 mg/dL (ref 0.50–1.10)

## 2013-03-17 LAB — TROPONIN I
Troponin I: <0.3 ng/mL (ref <0.30)
Troponin I: <0.3 ng/mL (ref <0.30)

## 2013-03-17 LAB — TSH: TSH: 2.331 u[IU]/mL (ref 0.350–4.500)

## 2013-03-17 MED ORDER — FUROSEMIDE 10 MG/ML IJ SOLN
40.0000 mg | Freq: Two times a day (BID) | INTRAMUSCULAR | Status: DC
  Administered 2013-03-17 – 2013-03-19 (×4): 40 mg via INTRAVENOUS
  Filled 2013-03-17 (×6): qty 4

## 2013-03-17 MED ORDER — SODIUM CHLORIDE 0.9 % IV SOLN: 250.0000 mL | INTRAVENOUS | Status: DC | PRN

## 2013-03-17 MED ORDER — ONDANSETRON HCL 4 MG/2ML IJ SOLN: 4.0000 mg | Freq: Four times a day (QID) | INTRAMUSCULAR | Status: DC | PRN

## 2013-03-17 MED ORDER — HEPARIN SODIUM (PORCINE) 5000 UNIT/ML IJ SOLN
5000.0000 [IU] | Freq: Three times a day (TID) | INTRAMUSCULAR | Status: DC
  Filled 2013-03-17 (×27): qty 1

## 2013-03-17 MED ORDER — FUROSEMIDE 10 MG/ML IJ SOLN: 40.0000 mg | Freq: Two times a day (BID) | INTRAMUSCULAR | Status: DC

## 2013-03-17 MED ORDER — CARVEDILOL 12.5 MG PO TABS
12.5000 mg | ORAL_TABLET | Freq: Two times a day (BID) | ORAL | Status: DC
  Administered 2013-03-17 – 2013-03-19 (×4): 12.5 mg via ORAL
  Filled 2013-03-17 (×5): qty 1

## 2013-03-17 MED ORDER — SODIUM CHLORIDE 0.9 % IJ SOLN: 3.0000 mL | INTRAMUSCULAR | Status: DC | PRN

## 2013-03-17 MED ORDER — INSULIN ASPART 100 UNIT/ML ~~LOC~~ SOLN
0.0000 [IU] | Freq: Three times a day (TID) | SUBCUTANEOUS | Status: DC
  Administered 2013-03-18: 3 [IU] via SUBCUTANEOUS
  Administered 2013-03-19: 2 [IU] via SUBCUTANEOUS
  Administered 2013-03-19: 3 [IU] via SUBCUTANEOUS
  Administered 2013-03-20 (×2): 4 [IU] via SUBCUTANEOUS
  Administered 2013-03-20: 3 [IU] via SUBCUTANEOUS
  Administered 2013-03-21: 4 [IU] via SUBCUTANEOUS
  Administered 2013-03-21 – 2013-03-22 (×3): 3 [IU] via SUBCUTANEOUS
  Administered 2013-03-22 – 2013-03-24 (×6): 4 [IU] via SUBCUTANEOUS
  Administered 2013-03-24: 3 [IU] via SUBCUTANEOUS
  Administered 2013-03-24 – 2013-03-25 (×3): 4 [IU] via SUBCUTANEOUS

## 2013-03-17 MED ORDER — POTASSIUM CHLORIDE CRYS ER 20 MEQ PO TBCR
20.0000 meq | EXTENDED_RELEASE_TABLET | Freq: Every day | ORAL | Status: DC
  Administered 2013-03-18 – 2013-03-25 (×8): 20 meq via ORAL
  Filled 2013-03-17 (×7): qty 1
  Filled 2013-03-17: qty 2
  Filled 2013-03-17: qty 1

## 2013-03-17 MED ORDER — INSULIN ASPART 100 UNIT/ML ~~LOC~~ SOLN
0.0000 [IU] | Freq: Every day | SUBCUTANEOUS | Status: DC
  Administered 2013-03-22: 2 [IU] via SUBCUTANEOUS

## 2013-03-17 MED ORDER — LISINOPRIL 10 MG PO TABS
10.0000 mg | ORAL_TABLET | Freq: Every day | ORAL | Status: DC
  Administered 2013-03-18 – 2013-03-19 (×2): 10 mg via ORAL
  Filled 2013-03-17 (×2): qty 1

## 2013-03-17 MED ORDER — ACETAMINOPHEN 325 MG PO TABS: 650.0000 mg | ORAL_TABLET | ORAL | Status: DC | PRN

## 2013-03-17 MED ORDER — SODIUM CHLORIDE 0.9 % IJ SOLN
3.0000 mL | Freq: Two times a day (BID) | INTRAMUSCULAR | Status: DC
  Administered 2013-03-17 – 2013-03-25 (×7): 3 mL via INTRAVENOUS

## 2013-03-17 MED ORDER — ASPIRIN EC 325 MG PO TBEC
325.0000 mg | DELAYED_RELEASE_TABLET | Freq: Every day | ORAL | Status: DC
  Administered 2013-03-18 – 2013-03-25 (×8): 325 mg via ORAL
  Filled 2013-03-17 (×8): qty 1

## 2013-03-17 MED ORDER — FUROSEMIDE 10 MG/ML IJ SOLN
80.0000 mg | Freq: Once | INTRAMUSCULAR | Status: AC
  Administered 2013-03-17: 80 mg via INTRAVENOUS
  Filled 2013-03-17: qty 8

## 2013-03-17 NOTE — Progress Notes (Signed)
Patient transferred from ED to room 4735. Patient alert and oriented. Oriented and educated patient of reason she is here on the [facility]. HF folder given and explained. VSS. Oriented patient to call bell system and surroundings in room. Will continue to monitor to end of shift.

## 2013-03-17 NOTE — ED Provider Notes (Signed)
History     CSN: [id number]  Arrival date & time 03/17/13  1141   First MD Initiated Contact with Patient 03/17/13 1213      Chief Complaint  Patient presents with  . Chest Pain  . Shortness of Breath    (Consider location/radiation/quality/duration/timing/severity/associated sxs/prior treatment) Patient is a 62 y.o. female presenting with chest pain and shortness of breath. The history is provided by the patient.  Chest Pain Associated symptoms: shortness of breath   Associated symptoms: no abdominal pain, no back pain, no cough, no fever, no headache and no palpitations   Shortness of Breath Associated symptoms: chest pain   Associated symptoms: no abdominal pain, no cough, no fever, no headaches, no neck pain and no rash   pt notes hx cad/cabg, chf, c/o progressive sob for the past 1-2 weeks. Feels similar to prior chf exacerbations. Pt states is taking her normal meds, no recent change in meds/dose. Urinating normal amount. States unspecific wt increase above baseline. +orthopnea/pnd. No chest pain. +bil leg swelling. No cough or uri c/o. No fever or chills.     Past Medical History  Diagnosis Date  . Obesity, morbid   . Chronic systolic heart failure     Chronic systolic CHF  . History of tobacco abuse   . Hyperlipidemia 08/10/11  . Diabetes mellitus type 2 in obese 08/10/11  . CAD (coronary artery disease) 08/2011    NSTEMI with subsequent CABG 08/13/2011 with LIMA-LAD, SVG-diagonal, SVG-OM, SVG-PDA  . Ischemic cardiomyopathy 08/13/2011    EF 20% in 08/2011, still 25% 10/21/2011  . Pleural effusion     Requiring L thoracentesis 09/02/11  . Mitral regurgitation     Mild by echo 10/21/11  . Carpal tunnel syndrome on both sides     "since OHS 08/2011"  . Hypertension   . Hx of CABG     October, 2012  . Pulmonary hypertension     Echo, September, 2013, 72 mmHg.  Marland Kitchen Ejection fraction < 50%     EF 20%, October, 2012  //  EF 25% December, 2012  //   EF 35%, echo,  September, 2013    Past Surgical History  Procedure Laterality Date  . Coronary artery bypass graft  08/13/11    CABG x4 with LIMA to LAD, SVG to Diag, SVG to OM, SVG to PDA, EVH via both thighs  . Gallstones removed laparoscopy  ~ 2000    Family History  Problem Relation Age of Onset  . Heart disease Father   . Cancer Neg Hx   . Diabetes Neg Hx   . Hypertension Neg Hx   . Kidney disease Neg Hx     History  Substance Use Topics  . Smoking status: Former Smoker    Types: Cigarettes    Quit date: 11/11/1978  . Smokeless tobacco: Never Used     Comment: "only a social smoker when I did smoke"  . Alcohol Use: Yes     Comment: 11/13/11 "in the last 6 months I've had 1 drink"    OB History   Grav Para Term Preterm Abortions TAB SAB Ect Mult Living                  Review of Systems  Constitutional: Negative for fever and chills.  HENT: Negative for neck pain.   Eyes: Negative for redness.  Respiratory: Positive for shortness of breath. Negative for cough.   Cardiovascular: Positive for chest pain and leg swelling. Negative for palpitations.  Gastrointestinal: Negative for abdominal pain.  Genitourinary: Negative for flank pain.  Musculoskeletal: Negative for back pain.  Skin: Negative for rash.  Neurological: Negative for headaches.  Hematological: Does not bruise/bleed easily.  Psychiatric/Behavioral: Negative for confusion.    Allergies  Review of patient's allergies indicates no known allergies.  Home Medications   Current Outpatient Rx  Name  Route  Sig  Dispense  Refill  . ACCU-CHEK SMARTVIEW test strip               . aspirin 325 MG EC tablet   Oral   Take 325 mg by mouth daily.           . carvedilol (COREG) 12.5 MG tablet   Oral   Take 1 tablet (12.5 mg total) by mouth 2 (two) times daily.   180 tablet   3   . furosemide (LASIX) 40 MG tablet      Take 80mg  (2 tablets) by mouth twice daily for 3 days and then decrease to 80mg  (2 tabs) every  am and 40mg  (1 tab) every pm   270 tablet   3   . glipiZIDE (GLUCOTROL) 5 MG tablet   Oral   Take 1 tablet (5 mg total) by mouth 2 (two) times daily before a meal.   180 tablet   3   . lisinopril (PRINIVIL,ZESTRIL) 10 MG tablet   Oral   Take 1 tablet (10 mg total) by mouth daily.   90 tablet   3     Dose increase   . potassium chloride SA (K-DUR,KLOR-CON) 20 MEQ tablet   Oral   Take 1 tablet (20 mEq total) by mouth daily.   90 tablet   3   . pravastatin (PRAVACHOL) 40 MG tablet   Oral   Take 1 tablet (40 mg total) by mouth daily.   90 tablet   0     BP 135/78  Pulse 73  Temp(Src) 97.8 F (36.6 C) (Oral)  Resp 20  SpO2 93%  Physical Exam  Nursing note and vitals reviewed. Constitutional: She appears well-developed and well-nourished. No distress.  HENT:  Mouth/Throat: Oropharynx is clear and moist.  Eyes: Conjunctivae are normal. No scleral icterus.  Neck: Neck supple. JVD present. No tracheal deviation present.  Cardiovascular: Normal rate, regular rhythm, normal heart sounds and intact distal pulses.   Pulmonary/Chest: Effort normal and breath sounds normal. No respiratory distress.  Abdominal: Soft. Normal appearance and bowel sounds are normal. She exhibits no distension. There is no tenderness.  obese  Genitourinary:  No cva tenderness  Musculoskeletal: She exhibits edema. She exhibits no tenderness.  Symmetric edema bil legs to thigh  Neurological: She is alert.  Skin: Skin is warm and dry. No rash noted. She is not diaphoretic.  Psychiatric: She has a normal mood and affect.    ED Course  Procedures (including critical care time)   Results for orders placed during the hospital encounter of 03/17/13  CBC      Result Value Range   WBC 4.8  4.0 - 10.5 K/uL   RBC 4.41  3.87 - 5.11 MIL/uL   Hemoglobin 13.6  12.0 - 15.0 g/dL   HCT 16.1  09.6 - 04.5 %   MCV 89.6  78.0 - 100.0 fL   MCH 30.8  26.0 - 34.0 pg   MCHC 34.4  30.0 - 36.0 g/dL   RDW 40.9   81.1 - 91.4 %   Platelets 220  150 - 400 K/uL  PRO B NATRIURETIC PEPTIDE      Result Value Range   Pro B Natriuretic peptide (BNP) 1322.0 (*) 0 - 125 pg/mL  BASIC METABOLIC PANEL      Result Value Range   Sodium 138  135 - 145 mEq/L   Potassium 4.4  3.5 - 5.1 mEq/L   Chloride 106  96 - 112 mEq/L   CO2 25  19 - 32 mEq/L   Glucose, Bld 100 (*) 70 - 99 mg/dL   BUN 19  6 - 23 mg/dL   Creatinine, Ser 1.61  0.50 - 1.10 mg/dL   Calcium 9.3  8.4 - 09.6 mg/dL   GFR calc non Af Amer 88 (*) >90 mL/min   GFR calc Af Amer >90  >90 mL/min  POCT I-STAT TROPONIN I      Result Value Range   Troponin i, poc 0.02  0.00 - 0.08 ng/mL   Comment 3            Dg Chest 2 View  03/17/2013  *RADIOLOGY REPORT*  Clinical Data: Chest tightness and shortness of breath.  CHEST - 2 VIEW  Comparison: 12/09/2011  Findings: Two views of the chest demonstrate stable cardiomegaly. The patient has median sternotomy wires.  Prominent central vascular structures appear unchanged.  Stable densities at the left lung base may represent scarring.  No evidence for pleural effusions.  IMPRESSION: Stable cardiomegaly and prominent central vascular structures. Findings are similar to the previous examination.  No evidence for frank pulmonary edema.   Original Report Authenticated By: Richarda Overlie, M.D.       MDM  Iv ns. Labs. Cxr. Ecg.  Reviewed nursing notes and prior charts for additional history.   Lasix iv.   [place] card called-  Will see in ed.  Recheck pt no cp.        Suzi Roots, MD 03/17/13 1416

## 2013-03-17 NOTE — H&P (Signed)
History and Physical  Patient ID: Christine Knight MRN: [id number], SOB: [birth year] 62 y.o. Date of Encounter: 03/17/2013, 2:55 PM  Primary Physician: Sanda Linger, MD Primary Cardiologist: Willa Rough, MD   Chief Complaint: shortness of breath and leg swelling  HPI: 62 y.o. female w/ PMHx significant for ischemic cardiomyopathy, h/o NSTEMI s/p CABG Oct 2012, chronic systolic congestive heart failure (07/2012 EF 35%), pulmonary hypertension (PA peak ), mild mitral valve regurgitation, poorly controlled diabetes mellitus type 2, hypothyroidism and morbid obesity who presented to [facility] on 03/17/2013 with complaints of increasing shortness of breath and progressive bilateral leg swelling over the past few months since starting an additional job "doing taxes". She reports weight gain, pain in her legs with ambulation and fatigue. She normally uses one pillow at night and did so yesterday evening. She presented today because she was awakened from sleep short of breath such that she had to sit upright.   In addition, she experienced fleeting chest pressure that radiates to her left shoulder.  This pressure lasts for seconds at a time but has occurred several times since onset yesterday evening. She reports compliance without missed doses of all of her medications namely Lasix, Lisinopril, and Carvedilol.  Of note, pt last evaluated in Dr. Myrtis Ser office Oct 2013 for sob and fatigue at which time she was put on tapering dose of Lasix (weight 267 lbs).  She reports that following the increase in Lasix at that time she felt "good" and was down to 242 lbs by January.    Past Medical History  Diagnosis Date  . Obesity, morbid   . Chronic systolic heart failure     Chronic systolic CHF  . History of tobacco abuse   . Hyperlipidemia 08/10/11  . Diabetes mellitus type 2 in obese 08/10/11  . CAD (coronary artery disease) 08/2011    NSTEMI with subsequent CABG 08/13/2011 with LIMA-LAD,  SVG-diagonal, SVG-OM, SVG-PDA  . Ischemic cardiomyopathy 08/13/2011    EF 20% in 08/2011, still 25% 10/21/2011  . Pleural effusion     Requiring L thoracentesis 09/02/11  . Mitral regurgitation     Mild by echo 10/21/11  . Carpal tunnel syndrome on both sides     "since OHS 08/2011"  . Hypertension   . Hx of CABG     October, 2012  . Pulmonary hypertension     Echo, September, 2013, 72 mmHg.  Marland Kitchen Ejection fraction < 50%     EF 20%, October, 2012  //  EF 25% December, 2012  //   EF 35%, echo, September, 2013     Surgical History:  Past Surgical History  Procedure Laterality Date  . Coronary artery bypass graft  08/13/11    CABG x4 with LIMA to LAD, SVG to Diag, SVG to OM, SVG to PDA, EVH via both thighs  . Gallstones removed laparoscopy  ~ 2000     Home Meds: Prior to Admission medications   Medication Sig Start Date End Date Taking? Authorizing Provider  aspirin 325 MG EC tablet Take 325 mg by mouth daily.     Yes Historical Provider, MD  carvedilol (COREG) 12.5 MG tablet Take 1 tablet (12.5 mg total) by mouth 2 (two) times daily. 08/07/12 08/07/13 Yes Luis Abed, MD  furosemide (LASIX) 40 MG tablet Take 40 mg by mouth 2 (two) times daily.   Yes Historical Provider, MD  glipiZIDE (GLUCOTROL) 5 MG tablet Take 1 tablet (5 mg total) by mouth 2 (two) times  daily before a meal. 08/05/12 08/05/13 Yes Michele Mcalpine, MD  lisinopril (PRINIVIL,ZESTRIL) 10 MG tablet Take 1 tablet (10 mg total) by mouth daily. 08/07/12 08/07/13 Yes Luis Abed, MD  potassium chloride SA (K-DUR,KLOR-CON) 20 MEQ tablet Take 1 tablet (20 mEq total) by mouth daily. 08/07/12  Yes Luis Abed, MD  ACCU-CHEK SMARTVIEW test strip  08/27/11   Historical Provider, MD    Allergies: No Known Allergies  History   Social History  . Marital Status: Divorced    Spouse Name: N/A    Number of Children: N/A  . Years of Education: N/A   Occupational History  . Not on file.   Social History Main Topics  . Smoking  status: Former Smoker    Types: Cigarettes    Quit date: 11/11/1978  . Smokeless tobacco: Never Used     Comment: "only a social smoker when I did smoke"  . Alcohol Use: Yes     Comment: 11/13/11 "in the last 6 months I've had 1 drink"  . Drug Use: No  . Sexually Active: No   Other Topics Concern  . Not on file   Social History Narrative  . No narrative on file     Family History  Problem Relation Age of Onset  . Heart disease Father   . Cancer Neg Hx   . Diabetes Neg Hx   . Hypertension Neg Hx   . Kidney disease Neg Hx     Review of Systems: Constitutional:  Endorses fatigue, Denies fever, chills, diaphoresis, appetite change  HEENT: Denies congestion, sore throat, rhinorrhea  Respiratory: Endorses SOB, DOE, Denies cough, wheezing.  Cardiovascular: Endorses chest pressure and leg swelling, denies palpitations  Gastrointestinal: Denies nausea, vomiting, abdominal pain, diarrhea, constipation, blood in stool and abdominal distention.  Genitourinary: Denies dysuria, urgency, frequency, hematuria, flank pain and difficulty urinating.  Musculoskeletal: Denies myalgias, back pain, joint swelling, arthralgias and gait problem.   Skin: Denies rash and wound.  Neurological: Denies dizziness, seizures, syncope, weakness, light-headedness, numbness and headaches.   All other systems reviewed and are otherwise negative except as noted above.    Physical Exam: Blood pressure 119/71, pulse 73, temperature 97.8 F (36.6 C), temperature source Oral, resp. rate 18, SpO2 95.00%. General: Obese, AA female, alert and oriented, in mild distress. HEENT: Normocephalic, atraumatic, murky sclera, no xanthomas  Neck: Supple. Carotids 2+ without bruits. No increased JVP appreciated Lungs: Clear bilaterally to auscultation without wheezes, rales, or rhonchi Heart: RRR with normal S1 and S2. No murmurs, rubs, or gallops appreciated. Abdomen: Soft, obese, non-tender, normoactive bowel sounds.    Back: No CVA tenderness Msk:  Strength and tone appear normal for age although lifting her legs on table required exertion Extremities: Bilateral LE 2+ edema up to lower thigh.  Distal pedal pulses are 2+ and equal bilaterally. Warm, mildly tender to palpation along shin, no posterior leg tenderness Neuro: CNII-XII intact, moves all extremities spontaneously. Psych:  Responds to questions appropriately with a normal affect.   Labs:   Lab Results  Component Value Date   WBC 4.8 03/17/2013   HGB 13.6 03/17/2013   HCT 39.5 03/17/2013   MCV 89.6 03/17/2013   PLT 220 03/17/2013    Recent Labs Lab 03/17/13 1152  NA 138  K 4.4  CL 106  CO2 25  BUN 19  CREATININE 0.79  CALCIUM 9.3  GLUCOSE 100*   No results found for this basename: CKTOTAL, CKMB, TROPONINI,  in the last 72 hours  Lab Results  Component Value Date   CHOL 174 01/30/2012   HDL 51.00 01/30/2012   LDLCALC 112* 01/30/2012   TRIG 57.0 01/30/2012   No results found for this basename: DDIMER    Radiology/Studies:  Dg Chest 2 View  03/17/2013  *RADIOLOGY REPORT*  Clinical Data: Chest tightness and shortness of breath.  CHEST - 2 VIEW  Comparison: 12/09/2011  Findings: Two views of the chest demonstrate stable cardiomegaly. The patient has median sternotomy wires.  Prominent central vascular structures appear unchanged.  Stable densities at the left lung base may represent scarring.  No evidence for pleural effusions.  IMPRESSION: Stable cardiomegaly and prominent central vascular structures. Findings are similar to the previous examination.  No evidence for frank pulmonary edema.   Original Report Authenticated By: Richarda Overlie, M.D.    2D ECHO 07/18/2012 Study Conclusions - Left ventricle: Inferior wall hypokinesis with septal and apical akinesis The cavity size was moderately dilated. Wall thickness was increased in a pattern of mild LVH. The estimated ejection fraction was 35%. - Mitral valve: Mild regurgitation. - Left atrium: The  atrium was mildly dilated. - Atrial septum: No defect or patent foramen ovale was identified. - Pulmonary arteries: PA peak pressure: 72mm Hg (S).  Oct 2012 CABG x4 with LIMA to LAD, SVG to Diag, SVG to OM, SVG to PDA, EVH via both thighs     EKG: EKG revealed 79bpm, NSR, RAD, decreased anterior R-wave progression.   ASSESSMENT AND PLAN:  1. Acute on chronic systolic congestive heart failure: check BNP, repeat 2D ECHO, IV Lasix 2. Ischemic cardiomyopathy with h/o NSTEMI: new chest pressure with radiation, cycle troponins  3. Pulmonary hypertension: repeat ECHO to assess peak PA pressure 4. Mitral regurgitation, mild 5. CAD s/p CABG  6. Diabetes Mellitus Type 2, uncontrolled: check HgbA1c  DISCUSSION: Ms. Mand symptoms of increased dyspnea, fatigue, and lower extremity edema is likely secondary to acute exacerbation of her CHF.  She has a 25 lb weight gained since Jan 2013 (dry weight ~242 lbs). Her most recent EF was 35% and she reports compliance with her loop diuretic therapy. Pt received Lasix 80 mg IV in ED. Check pro-BNP and get 2D ECHO to reassess her left ventricular function as well as pulmonary artery pressure. We will medically manage with Lasix 40 mg IV bid (double her home dose equivalent of 40 mg orally), monitor daily weights,  I/Os and creatinine function.  She is currently without chest pressure as she states it is fleeting in nature.  We will cycle her troponins given her cardiac history and continue her ACEi and beta-blocker as her blood pressure tolerates.  As for her diabetes, we will assess her glycemic control by checking HgbA1c and hold her glipizide in the acute setting.  She will need SSI coverage with consideration for basal insulin.  She would also benefit from Diabetes Coordinator consultation.  Signed, Kristie Cowman, MD Internal Medicine Resident PGY2 03/17/2013, 2:55 PM   Attending Note:   The patient was seen and examined.  Agree with assessment and  plan as noted above.  Changes made to the above note as needed.  She presents with 3 weeks of progressive weight gain, leg edema and dyspnea.  She has been compliant with her meds.    Exam is above. 2+ leg edema.   Will give her Lasix 40 IV bid for now.  She is putting out lots of urine with that dose.  Check labs tomorrow.    Vesta Mixer,  Montez Hageman., MD, [facility] 03/17/2013, 4:57 PM

## 2013-03-17 NOTE — ED Notes (Signed)
Reports not feeling well x several weeks, having sob with exertion and swelling to her legs. Now having increase in sob, unable to sleep and has chest pressure that radiates into her neck. Airway intact at triage, speaking in full sentences and ekg done.

## 2013-03-18 DIAGNOSIS — E039 Hypothyroidism, unspecified: Secondary | ICD-10-CM | POA: Diagnosis present

## 2013-03-18 DIAGNOSIS — I509 Heart failure, unspecified: Secondary | ICD-10-CM

## 2013-03-18 DIAGNOSIS — I5023 Acute on chronic systolic (congestive) heart failure: Principal | ICD-10-CM

## 2013-03-18 DIAGNOSIS — I059 Rheumatic mitral valve disease, unspecified: Secondary | ICD-10-CM

## 2013-03-18 LAB — BASIC METABOLIC PANEL
BUN: 23 mg/dL (ref 6–23)
CO2: 27 mEq/L (ref 19–32)
Calcium: 9.1 mg/dL (ref 8.4–10.5)
Glucose, Bld: 122 mg/dL — ABNORMAL HIGH (ref 70–99)
Potassium: 4.1 mEq/L (ref 3.5–5.1)
Sodium: 140 mEq/L (ref 135–145)

## 2013-03-18 LAB — GLUCOSE, CAPILLARY
Glucose-Capillary: 115 mg/dL — ABNORMAL HIGH (ref 70–99)
Glucose-Capillary: 122 mg/dL — ABNORMAL HIGH (ref 70–99)
Glucose-Capillary: 160 mg/dL — ABNORMAL HIGH (ref 70–99)

## 2013-03-18 NOTE — Progress Notes (Signed)
Patient ID: Christine Knight, female   DOB: Mar 27, 1951, 62 y.o.   MRN: [id number]   SUBJECTIVE: Good diuresis.Renal function is stable today. The patient is still uncomfortable with her fluid overload. She has marked edema. She says that she had been doing quite well for a period of time. Then the edema started and continued to become out of control.   Filed Vitals:   03/17/13 1638 03/17/13 2100 03/18/13 0225 03/18/13 0605  BP: 120/64 121/71 107/61 102/58  Pulse: 83 69 62 58  Temp: 97.7 F (36.5 C) 97.8 F (36.6 C) 97.6 F (36.4 C) 99.2 F (37.3 C)  TempSrc: Oral Oral Oral Oral  Resp: 20 20 18 20   Height: 5\' 7"  (1.702 m)     Weight: 293 lb 11.2 oz (133.221 kg)   291 lb 14.2 oz (132.4 kg)  SpO2: 99% 95% 95% 95%    Intake/Output Summary (Last 24 hours) at 03/18/13 0718 Last data filed at 03/18/13 4098  Gross per 24 hour  Intake    600 ml  Output   2500 ml  Net  -1900 ml    LABS: Basic Metabolic Panel:  Recent Labs  11/91/47 1152 03/17/13 1631 03/18/13 0350  NA 138  --  140  K 4.4  --  4.1  CL 106  --  103  CO2 25  --  27  GLUCOSE 100*  --  122*  BUN 19  --  23  CREATININE 0.79 0.81 0.83  CALCIUM 9.3  --  9.1  MG  --  2.0  --    Liver Function Tests: No results found for this basename: AST, ALT, ALKPHOS, BILITOT, PROT, ALBUMIN,  in the last 72 hours No results found for this basename: LIPASE, AMYLASE,  in the last 72 hours CBC:  Recent Labs  03/17/13 1152 03/17/13 1631  WBC 4.8 5.6  HGB 13.6 13.6  HCT 39.5 40.1  MCV 89.6 90.1  PLT 220 243   Cardiac Enzymes:  Recent Labs  03/17/13 1631 03/17/13 2221 03/18/13 0350  TROPONINI <0.30 <0.30 <0.30   BNP: No components found with this basename: POCBNP,  D-Dimer: No results found for this basename: DDIMER,  in the last 72 hours Hemoglobin A1C:  Recent Labs  03/17/13 1631  HGBA1C 6.7*   Fasting Lipid Panel: No results found for this basename: CHOL, HDL, LDLCALC, TRIG, CHOLHDL, LDLDIRECT,  in the  last 72 hours Thyroid Function Tests:  Recent Labs  03/17/13 1631  TSH 2.331    RADIOLOGY: Dg Chest 2 View  03/17/2013  *RADIOLOGY REPORT*  Clinical Data: Chest tightness and shortness of breath.  CHEST - 2 VIEW  Comparison: 12/09/2011  Findings: Two views of the chest demonstrate stable cardiomegaly. The patient has median sternotomy wires.  Prominent central vascular structures appear unchanged.  Stable densities at the left lung base may represent scarring.  No evidence for pleural effusions.  IMPRESSION: Stable cardiomegaly and prominent central vascular structures. Findings are similar to the previous examination.  No evidence for frank pulmonary edema.   Original Report Authenticated By: Richarda Overlie, M.D.     PHYSICAL EXAM  The patient has 2+ peripheral edema. She is oriented to person time and place. Affect is normal. Lungs reveal scattered rhonchi. Cardiac exam reveals S1 and S2. The patient is overweight. There is 2+ peripheral edema.   TELEMETRY: I have reviewed telemetry today Mar 18, 2013. She has normal sinus rhythm. The rate is remaining between 60 and 65.   ASSESSMENT AND PLAN:  Obesity, morbid    Hyperlipidemia     When I saw her last in the office I outlined the fact that she had had some muscle aches and pains. We kept her off Pravachol for a while and this did not help her aches and pains. Decision at that time was to resume Pravachol. I will speak with her on rounds tomorrow concerning this issue to see if we can restart her Pravachol.    Diabetes mellitus type 2    CAD (coronary artery disease)     Troponins are normal    S/P CABG x 4    Ischemic cardiomyopathy     I have been trying to titrate her medications as an outpatient. She's been motivated to try to work as an outpatient. It has been difficult to have her in the office on a regular basis to titrate her meds. She is on carvedilol and her heart rate is at optimal level with her current dose. She is on an  ACE inhibitor. She has not yet had a trial of spironolactone. After further diuresis in the hospital I will try to add spironolactone while she is in the hospital. At some point we will reassess her LV function. I have not had a formal discussion with her about the possibility of an ICD over time.    Mitral regurgitation    Mild by history    Pulmonary hypertension    Ejection fraction < 50%    Hypothyroidism    TSH is normal    Acute on chronic systolic CHF (congestive heart failure)     She is diaries in her current regimen. I will leave her on Lasix 40 IV twice a day. I spoke with her again about being careful with her salt intake and limiting her fluid intake. She needs to keep her feet elevated.  Willa Rough 03/18/2013 7:18 AM

## 2013-03-18 NOTE — Progress Notes (Signed)
Utilization Review Completed.   Linday Rhodes, RN, BSN Nurse Case Manager  [phone]  

## 2013-03-18 NOTE — Progress Notes (Signed)
  Echocardiogram 2D Echocardiogram has been performed.  Christine Knight FRANCES 03/18/2013, 5:36 PM

## 2013-03-19 LAB — BASIC METABOLIC PANEL
Chloride: 101 mEq/L (ref 96–112)
GFR calc Af Amer: 88 mL/min — ABNORMAL LOW (ref >90)
Potassium: 4.2 mEq/L (ref 3.5–5.1)

## 2013-03-19 LAB — GLUCOSE, CAPILLARY
Glucose-Capillary: 167 mg/dL — ABNORMAL HIGH (ref 70–99)
Glucose-Capillary: 120 mg/dL — ABNORMAL HIGH (ref 70–99)
Glucose-Capillary: 145 mg/dL — ABNORMAL HIGH (ref 70–99)

## 2013-03-19 MED ORDER — CARVEDILOL 6.25 MG PO TABS
6.2500 mg | ORAL_TABLET | Freq: Two times a day (BID) | ORAL | Status: DC
  Administered 2013-03-19 – 2013-03-20 (×2): 6.25 mg via ORAL
  Filled 2013-03-19 (×4): qty 1

## 2013-03-19 MED ORDER — LISINOPRIL 5 MG PO TABS
5.0000 mg | ORAL_TABLET | Freq: Every day | ORAL | Status: DC
  Administered 2013-03-20: 5 mg via ORAL
  Filled 2013-03-19: qty 1

## 2013-03-19 MED ORDER — FUROSEMIDE 10 MG/ML IJ SOLN
10.0000 mg/h | INTRAVENOUS | Status: DC
  Administered 2013-03-19 – 2013-03-23 (×4): 10 mg/h via INTRAVENOUS
  Filled 2013-03-19 (×11): qty 25

## 2013-03-19 MED ORDER — SODIUM CHLORIDE 0.9 % IJ SOLN
10.0000 mL | INTRAMUSCULAR | Status: DC | PRN
  Administered 2013-03-20 – 2013-03-24 (×8): 10 mL
  Administered 2013-03-24 – 2013-03-25 (×2): 20 mL

## 2013-03-19 MED ORDER — CARVEDILOL 3.125 MG PO TABS
3.1250 mg | ORAL_TABLET | Freq: Two times a day (BID) | ORAL | Status: DC
  Filled 2013-03-19 (×2): qty 1

## 2013-03-19 NOTE — Plan of Care (Signed)
Problem: Food- and Nutrition-Related Knowledge Deficit (NB-1.1) Goal: Nutrition education Formal process to instruct or train a patient/client in a skill or to impart knowledge to help patients/clients voluntarily manage or modify food choices and eating behavior to maintain or improve health. Outcome: Completed/Met Date Met:  03/19/13  Nutrition Education Note  RD consulted for nutrition education regarding a Low Sodium diet.   Lipid Panel     Component Value Date/Time    CHOL 174 01/30/2012 1314    TRIG 57.0 01/30/2012 1314    HDL 51.00 01/30/2012 1314    CHOLHDL 3 01/30/2012 1314    VLDL 11.4 01/30/2012 1314    LDLCALC 112* 01/30/2012 1314    RD provided "Heart Healthy Nutrition Therapy." Reviewed patient's dietary recall. Provided examples on ways to decrease sodium and fat intake in diet. Discouraged intake of processed foods and use of salt shaker. Encouraged fresh fruits and vegetables as well as whole grain sources of carbohydrates to maximize fiber intake. Teach back method used.  Expect good compliance.  Body mass index is 45.57 kg/(m^2). Pt meets criteria for Obese Class III based on current BMI.  Current diet order is Heart Healthy, patient is consuming approximately 100% of meals at this time. Labs and medications reviewed. No further nutrition interventions warranted at this time. RD contact information provided. If additional nutrition issues arise, please re-consult RD.  Jarold Motto MS, RD, LDN Pager: [phone] After-hours pager: [phone]

## 2013-03-19 NOTE — Progress Notes (Signed)
03/19/13 1540 In to complete Heart Failure Home health screen.  Pt. states she does take care of herself, however lately she has been busy and did not look after herself well.  At this time, she does not want home health, and this NCM explained to pt. that home health would be helpful to keep her healthy, however, she did not want any assistance.  NCM will follow if further dc needs arise. Tera Mater, RN, BSN NCM [phone]

## 2013-03-19 NOTE — Progress Notes (Signed)
CARDIAC REHAB PHASE I   PRE:  Rate/Rhythm: 66 SR    BP: sitting 109/79    SaO2: 98 RA  MODE:  Ambulation: 200 ft   POST:  Rate/Rhythm: 66 SR    BP: sitting 110/70     SaO2: 98 RA  Pt very DOE. Sts this is constant but worse with activity. Sts her SOB hasn't changed since admit. SaO2 good. Pt rested at 100 ft then returned to room. Very dyspneic. Will continue to follow. IV team arrived for PICC. [id number]   Christine Knight CES, ACSM 03/19/2013 2:29 PM

## 2013-03-19 NOTE — Progress Notes (Signed)
PT had heart rate down to 36/min.then went back to upper 50's. Pt asymptomatic. Dr. Gerome Apley made aware no orders received. Continued to closely monitor pt.

## 2013-03-19 NOTE — Consult Note (Addendum)
Advanced Heart Failure Team Consult Note  Referring Physician: Dr Myrtis Ser Primary Physician: Dr Yetta Barre Primary Cardiologist:  Dr Myrtis Ser   Reason for Consultation: Acute/Chronic  Systolic Heart Failure with worsening EF.   HPI:   The Heart Failure Team was asked to provide a consult by Dr Myrtis Ser due acute/chronic systolic heart failure with worsening EF.   62 y.o. female w/ PMHx significant for morbid obesity, systolic HF due to ischemic cardiomyopathy (10/12 EF 25%; 07/2012 EF 35%; 03/18/13 EF 20-25%), CAD with h/o NSTEMI s/p CABG Oct 2012, pulmonary hypertension (PA peak ), and poorly controlled diabetes mellitus type 2.  Over the last 5 months her weight has gone up from the 240s to 290 pounds. In the last 2 weeks she reports increased dyspnea with exertion, resting SOB, and PND. Limited activity due to dyspena. She noticed increased lower extremity edema. She reports compliance with medications but had poor urine output from home diuretic regimen (lasix 40 mg bid). She continued to work full time until hospital admit. Lives alone.   Admitted 03/17/13 from [facility] ED with recurrent HF. She was placed 40 mg IV lasix bid. Admit weight 293 pounds. Pertinent admission labs include: Pro BNP 1322, CEs negative, Magnesium 2.0, and creatinine 0.81. Has been diuresing fairly well but weight only down 2 pounds.   Remains SOB talking. + Orthopnea.    ECHO 03/18/14 EF 20-25% Peak PA pressure 65 RV mildly elevated.     Review of Systems: [y] = yes, [ ]  = no   General: Weight gain [Y ]; Weight loss [ ] ; Anorexia [ ] ; Fatigue [ Y]; Fever [ ] ; Chills [ ] ; Weakness [ ]   Cardiac: Chest pain/pressure [ ] ; Resting SOB [Y ]; Exertional SOB [ Y]; Orthopnea [Y ]; Pedal Edema [ Y]; Palpitations [ ] ; Syncope [ ] ; Presyncope [ ] ; Paroxysmal nocturnal dyspnea[Y ]  Pulmonary: Cough [Y ]; Wheezing[ ] ; Hemoptysis[ ] ; Sputum [ ] ; Snoring [ ]   GI: Vomiting[ ] ; Dysphagia[ ] ; Melena[ ] ; Hematochezia [ ] ; Heartburn[ ] ; Abdominal pain  [ ] ; Constipation [ ] ; Diarrhea [ ] ; BRBPR [ ]   GU: Hematuria[ ] ; Dysuria [ ] ; Nocturia[ ]   Vascular: Pain in legs with walking [ ] ; Pain in feet with lying flat [ ] ; Non-healing sores [ ] ; Stroke [ ] ; TIA [ ] ; Slurred speech [ ] ;  Neuro: Headaches[ ] ; Vertigo[ ] ; Seizures[ ] ; Paresthesias[ ] ;Blurred vision [ ] ; Diplopia [ ] ; Vision changes [ ]   Ortho/Skin: Arthritis [ ] ; Joint pain [Y ]; Muscle pain [ ] ; Joint swelling [ ] ; Back Pain [ Y]; Rash [ ]   Psych: Depression[ ] ; Anxiety[ ]   Heme: Bleeding problems [ ] ; Clotting disorders [ ] ; Anemia [ ]   Endocrine: Diabetes [ Y]; Thyroid dysfunction[ ]   Home Medications Prior to Admission medications   Medication Sig Start Date End Date Taking? Authorizing Provider  aspirin 325 MG EC tablet Take 325 mg by mouth daily.     Yes Historical Provider, MD  carvedilol (COREG) 12.5 MG tablet Take 1 tablet (12.5 mg total) by mouth 2 (two) times daily. 08/07/12 08/07/13 Yes Luis Abed, MD  furosemide (LASIX) 40 MG tablet Take 40 mg by mouth 2 (two) times daily.   Yes Historical Provider, MD  glipiZIDE (GLUCOTROL) 5 MG tablet Take 1 tablet (5 mg total) by mouth 2 (two) times daily before a meal. 08/05/12 08/05/13 Yes Michele Mcalpine, MD  lisinopril (PRINIVIL,ZESTRIL) 10 MG tablet Take 1 tablet (10 mg total) by mouth daily. 08/07/12  08/07/13 Yes Luis Abed, MD  potassium chloride SA (K-DUR,KLOR-CON) 20 MEQ tablet Take 1 tablet (20 mEq total) by mouth daily. 08/07/12  Yes Luis Abed, MD  ACCU-CHEK SMARTVIEW test strip  08/27/11   Historical Provider, MD    Past Medical History: Past Medical History  Diagnosis Date  . Obesity, morbid   . Chronic systolic heart failure     Chronic systolic CHF  . Hyperlipidemia 08/10/11  . Diabetes mellitus type 2 in obese 08/10/11  . CAD (coronary artery disease) 08/2011    NSTEMI with subsequent CABG 08/13/2011 with LIMA-LAD, SVG-diagonal, SVG-OM, SVG-PDA  . Ischemic cardiomyopathy 08/13/2011    EF 20% in 08/2011, still  25% 10/21/2011  . Pleural effusion     Requiring L thoracentesis 09/02/11  . Mitral regurgitation     Mild by echo 10/21/11  . Hypertension   . Pulmonary hypertension     Echo, September, 2013, 72 mmHg.  Marland Kitchen Ejection fraction < 50%     EF 20%, October, 2012  //  EF 25% December, 2012  //   EF 35%, echo, September, 2013  . Myocardial infarction 08/2011  . Orthopnoea     "progressively worse over last 3 wks" (03/17/2013)  . CHF (congestive heart failure)   . Carpal tunnel syndrome on both sides     "post OHS in 08/2011; resolved now" (03/17/2013)    Past Surgical History: Past Surgical History  Procedure Laterality Date  . Laparoscopy  1980's?    "removed gallstones; not gallbladder" (03/17/2013)  . Coronary artery bypass graft  08/13/11    CABG x4 with LIMA to LAD, SVG to Diag, SVG to OM, SVG to PDA, EVH via both thighs    Family History: Family History  Problem Relation Age of Onset  . Heart disease Father   . Cancer Neg Hx   . Diabetes Neg Hx   . Hypertension Neg Hx   . Kidney disease Neg Hx     Social History: History   Social History  . Marital Status: Divorced    Spouse Name: N/A    Number of Children: N/A  . Years of Education: N/A   Social History Main Topics  . Smoking status: Former Smoker    Types: Cigarettes    Quit date: 11/11/1978  . Smokeless tobacco: Never Used     Comment: 03/17/2013 "only a social smoker when I did smoke; ever bought any"  . Alcohol Use: Yes     Comment: 03/17/2013 "in the last 6 months I've had 1 glass of wine"  . Drug Use: No  . Sexually Active: Not Currently    Birth Control/ Protection: Post-menopausal   Other Topics Concern  . None   Social History Narrative  . None    Allergies:  No Known Allergies  Objective:    Vital Signs:   Temp:  [97.8 F (36.6 C)-97.9 F (36.6 C)] 97.9 F (36.6 C) (05/09 0530) Pulse Rate:  [59-71] 59 (05/09 0530) Resp:  [16-20] 16 (05/09 0530) BP: (113-121)/(62-77) 120/77 mmHg (05/09  0530) SpO2:  [97 %-100 %] 100 % (05/09 0530) Weight:  [291 lb (131.997 kg)] 291 lb (131.997 kg) (05/09 0530) Last BM Date: 03/18/13  Weight change: Filed Weights   03/17/13 1638 03/18/13 0605 03/19/13 0530  Weight: 293 lb 11.2 oz (133.221 kg) 291 lb 14.2 oz (132.4 kg) 291 lb (131.997 kg)    Intake/Output:   Intake/Output Summary (Last 24 hours) at 03/19/13 0914 Last data filed at 03/19/13  7829  Gross per 24 hour  Intake   1086 ml  Output   2100 ml  Net  -1014 ml     Physical Exam: General:  Chronically ill  appearing. Breathless when talking HEENT: normal Neck: supple. JVP to ear . Carotids 2+ bilat; no bruits. No lymphadenopathy or thryomegaly appreciated. Cor: PMI nondisplaced. Regular rate & rhythm. No rubs, gallops or murmurs. Lungs: clear Abdomen: Obese, soft, nontender, nondistended. No hepatosplenomegaly. No bruits or masses. Good bowel sounds. Extremities: no cyanosis, clubbing, rash, RLE and LLE 3+ edema Neuro: alert & orientedx3, cranial nerves grossly intact. moves all 4 extremities w/o difficulty. Affect pleasant  Telemetry: SR  Labs: Basic Metabolic Panel:  Recent Labs Lab 03/17/13 1152 03/17/13 1631 03/18/13 0350 03/19/13 0500  NA 138  --  140 138  K 4.4  --  4.1 4.2  CL 106  --  103 101  CO2 25  --  27 28  GLUCOSE 100*  --  122* 135*  BUN 19  --  23 24*  CREATININE 0.79 0.81 0.83 0.82  CALCIUM 9.3  --  9.1 9.5  MG  --  2.0  --   --     Liver Function Tests: No results found for this basename: AST, ALT, ALKPHOS, BILITOT, PROT, ALBUMIN,  in the last 168 hours No results found for this basename: LIPASE, AMYLASE,  in the last 168 hours No results found for this basename: AMMONIA,  in the last 168 hours  CBC:  Recent Labs Lab 03/17/13 1152 03/17/13 1631  WBC 4.8 5.6  HGB 13.6 13.6  HCT 39.5 40.1  MCV 89.6 90.1  PLT 220 243    Cardiac Enzymes:  Recent Labs Lab 03/17/13 1631 03/17/13 2221 03/18/13 0350  TROPONINI <0.30 <0.30 <0.30     BNP: BNP (last 3 results)  Recent Labs  03/17/13 1152  PROBNP 1322.0*    CBG:  Recent Labs Lab 03/18/13 0607 03/18/13 1132 03/18/13 1628 03/18/13 2120 03/19/13 0615  GLUCAP 115* 146* 122* 160* 141*    Coagulation Studies: No results found for this basename: LABPROT, INR,  in the last 72 hours  Other results: EKG: low voltage. SR 79 bpm Imaging: Dg Chest 2 View  03/17/2013  *RADIOLOGY REPORT*  Clinical Data: Chest tightness and shortness of breath.  CHEST - 2 VIEW  Comparison: 12/09/2011  Findings: Two views of the chest demonstrate stable cardiomegaly. The patient has median sternotomy wires.  Prominent central vascular structures appear unchanged.  Stable densities at the left lung base may represent scarring.  No evidence for pleural effusions.  IMPRESSION: Stable cardiomegaly and prominent central vascular structures. Findings are similar to the previous examination.  No evidence for frank pulmonary edema.   Original Report Authenticated By: Richarda Overlie, M.D.       Medications:     Current Medications: . aspirin  325 mg Oral Daily  . carvedilol  12.5 mg Oral BID  . furosemide  40 mg Intravenous BID  . heparin  5,000 Units Subcutaneous Q8H  . insulin aspart  0-20 Units Subcutaneous TID WC  . insulin aspart  0-5 Units Subcutaneous QHS  . lisinopril  10 mg Oral Daily  . potassium chloride SA  20 mEq Oral Daily  . sodium chloride  3 mL Intravenous Q12H     Infusions:      Assessment:   1. Acute on chronic systolic congestive heart failure: EF 25% 03/18/13  2. Ischemic cardiomyopathy   --EF (10/12) 25%   --  EF (9/13)  35%             --EF (03/18/13)  20-25%  with h/o NSTEMI: CEs negative  3. Pulmonary hypertension: Peak PA pressure 65 4. Mitral regurgitation, mild  5. CAD s/p CABG 2012 6. Diabetes Mellitus Type 2, uncontrolled:  Hgb A1c 6.7 7. Morbid obesity 8. Acute on chronic respiratory failure 9. Probable obesity hypoventilation  syndrome   Plan/Discussion:    Ms Knight is 38 year admitted with acute systolic heart failure with massive volume overload. EF actually appears pretty stable when compared to 10/12. Perhaps slightly down from 2013.  Wii start lasix drip at 10 mg per hour. Try to diurese 20-30 pounds. May need PICC to assess CVP/CO-OX. Cut carvedilol down from 12.5 to 3.125 mg twice daily due to acute decompensation. Cut back lisinopril to 5 mg twice a day. Will need spironolactone later and possibly hydralazine/IMDUR. Once diuresed will need RHC.   Consult dietitian and cardiac rehab.   Length of Stay: 2  CLEGG,AMY 03/19/2013, 9:14 AM  Patient seen and examined with Tonye Becket, NP. We discussed all aspects of the encounter. I agree with the assessment and plan as stated above.   Ms. Cardella is massively volume overloaded in the setting of severe systolic HF and probable obesity hypoventilation syndrome. Agree with lasix gtt. Place PICC. If not diuresing well will need transfer to stepdown for monitoring of CVP and co-ox.   Will need RHC once diuresed to establish baseline. At discharge would favor torsemide over po lasix. Agree with cutting back b-blocker and ACE-I for now given soft BP. Add spiro to minimize potassium requirements in setting of need for large-volume diuresis.  Over weekend would focus on diuresis (baseline weight ~240) . If not, diuresing would send to SDU for co-ox and CVP montoring with probable addition of milrinone.  Dyrell Tuccillo,MD 10:20 AM    Advanced Heart Failure Team Pager [phone] (M-F; 7a - 4p)  Please contact [facility] for night-coverage after hours (4p -7a ) and weekends on [email or web address]

## 2013-03-19 NOTE — Progress Notes (Signed)
Peripherally Inserted Central Catheter/Midline Placement  The IV Nurse has discussed with the patient and/or persons authorized to consent for the patient, the purpose of this procedure and the potential benefits and risks involved with this procedure.  The benefits include less needle sticks, lab draws from the catheter and patient may be discharged home with the catheter.  Risks include, but not limited to, infection, bleeding, blood clot (thrombus formation), and puncture of an artery; nerve damage and irregular heat beat.  Alternatives to this procedure were also discussed.  PICC/Midline Placement Documentation        Christine Knight 03/19/2013, 2:49 PM

## 2013-03-19 NOTE — Progress Notes (Signed)
Patient ID: Christine Knight, female   DOB: 04/26/1951, 62 y.o.   MRN: [id number]   Pt. Diuresing. I read echo from last night. EF now 20-25%, somewhat worse than 2013. I will ask Heart Failure team to help.  Jerral Bonito, MD

## 2013-03-20 DIAGNOSIS — I2589 Other forms of chronic ischemic heart disease: Secondary | ICD-10-CM

## 2013-03-20 LAB — BASIC METABOLIC PANEL
BUN: 24 mg/dL — ABNORMAL HIGH (ref 6–23)
Calcium: 8.9 mg/dL (ref 8.4–10.5)
Creatinine, Ser: 0.92 mg/dL (ref 0.50–1.10)
GFR calc Af Amer: 76 mL/min — ABNORMAL LOW (ref >90)
GFR calc non Af Amer: 66 mL/min — ABNORMAL LOW (ref >90)

## 2013-03-20 LAB — GLUCOSE, CAPILLARY: Glucose-Capillary: 138 mg/dL — ABNORMAL HIGH (ref 70–99)

## 2013-03-20 MED ORDER — SPIRONOLACTONE 25 MG PO TABS
25.0000 mg | ORAL_TABLET | Freq: Every day | ORAL | Status: DC
  Administered 2013-03-20 – 2013-03-25 (×6): 25 mg via ORAL
  Filled 2013-03-20 (×6): qty 1

## 2013-03-20 MED ORDER — LISINOPRIL 5 MG PO TABS
5.0000 mg | ORAL_TABLET | Freq: Two times a day (BID) | ORAL | Status: DC
  Administered 2013-03-20 – 2013-03-25 (×8): 5 mg via ORAL
  Filled 2013-03-20 (×11): qty 1

## 2013-03-20 MED ORDER — CARVEDILOL 12.5 MG PO TABS
12.5000 mg | ORAL_TABLET | Freq: Two times a day (BID) | ORAL | Status: DC
  Administered 2013-03-20 – 2013-03-25 (×8): 12.5 mg via ORAL
  Filled 2013-03-20 (×13): qty 1

## 2013-03-20 NOTE — Progress Notes (Signed)
Christine Knight  62 y.o.  female  Subjective: Patient feels much better. Dyspnea resolved. Edema has notably decreased. Polyuria and urinary frequency secondary to diuretic continues unabated today.  Allergy: Review of patient's allergies indicates no known allergies.  Objective: Vital signs in last 24 hours: Temp:  [97.8 F (36.6 C)-98.6 F (37 C)] 98 F (36.7 C) (05/10 0552) Pulse Rate:  [58-69] 68 (05/10 0552) Resp:  [18] 18 (05/10 0552) BP: (100-109)/(59-79) 103/61 mmHg (05/10 0552) SpO2:  [90 %-98 %] 90 % (05/10 0552) Weight:  [128.005 kg (282 lb 3.2 oz)] 128.005 kg (282 lb 3.2 oz) (05/10 0552)  128.005 kg (282 lb 3.2 oz) Body mass index is 44.19 kg/(m^2).  Weight change: -3.992 kg (-8 lb 12.8 oz) Last BM Date: 03/18/13  Intake/Output from previous day: 05/09 0701 - 05/10 0700 In: 1260 [P.O.:1260] Out: 5950 [Urine:5950] Total I/O since admission:  -7.8 L  General- Well developed; no acute distress; markedly overweight Neck-mild JVD, no carotid bruits Lungs- decreased breath sounds at the bases; normal I:E ratio Cardiovascular-PMI not palpable; distant S1 and S2 Abdomen- normal bowel sounds; soft and non-tender without masses or organomegaly Skin- Warm, no significant lesions Extremities- Nl distal pulses; 2+ pretibial edema  Lab Results: Cardiac Markers:   Recent Labs  03/17/13 2221 03/18/13 0350  TROPONINI <0.30 <0.30   CBC:   Recent Labs  03/17/13 1631  WBC 5.6  HGB 13.6  HCT 40.1  PLT 243   BMET:  Recent Labs  03/19/13 0500 03/20/13 0500  NA 138 137  K 4.2 3.9  CL 101 97  CO2 28 32  GLUCOSE 135* 156*  BUN 24* 24*  CREATININE 0.82 0.92  CALCIUM 9.5 8.9   EKG: Tracing performed 03/17/13 reviewed: Normal sinus rhythm; right ventricular conduction delay; possible prior septal MI; low-voltage; left posterior fascicular block; minor nonspecific ST-T wave abnormality; no previous tracing for comparison.  Echocardiogram 03/18/13: Moderate left  ventricular enlargement; severe left ventricular dysfunction with EF 20-25%, moderate pulmonary hypertension.  Imaging Studies/Results: CXR 03/17/2013: Stable cardiomegaly and vascular redistribution; no pulmonary edema.  Imaging: Imaging results have been reviewed  Medications:  I have reviewed the patient's current medications. Scheduled: . aspirin  325 mg Oral Daily  . carvedilol  6.25 mg Oral BID WC  . heparin  5,000 Units Subcutaneous Q8H  . insulin aspart  0-20 Units Subcutaneous TID WC  . insulin aspart  0-5 Units Subcutaneous QHS  . lisinopril  5 mg Oral Daily  . potassium chloride SA  20 mEq Oral Daily  . sodium chloride  3 mL Intravenous Q12H   Infusions:   . furosemide (LASIX) infusion 10 mg/hr (03/20/13 0951)    Assessment/Plan: Congestive heart failure, systolic, acute on chronic: Patient is diuresing exuberantly with continuous furosemide infusion and tolerating well.  Blood pressure is on the low side, but no systolics have been less than 100 mmHg. We will continue to titrate her medications and to diuresis as tolerated.  Baseline weight appears to be approximately 255 pounds suggesting the presence of substantial remaining excess fluid.  Ischemic cardiomyopathy: No evidence for acute ischemia; myocardial infarction ruled out.  LOS: 3 days   [place] Bing 03/20/2013, 12:30 PM

## 2013-03-20 NOTE — Progress Notes (Signed)
CARDIAC REHAB PHASE I   PRE:  Rate/Rhythm: 60SR with PVC    BP: sitting 100/70    SaO2: 95 RA  MODE:  Ambulation: 360 ft   POST:  Rate/Rhythm: 72     BP: sitting 120/84     SaO2: 88-91 RA  Pt less SOB today. Able to increase distance. Still considerably SOB at 1/2 way, then rested, and SOB on return to room. SaO2 surprisingly lower after walk (wasn't yesterday). Encouraged being up in recliner and pursed lip breathing. Also to walk 3-4 times a day. [id number]   Elissa Lovett [place] CES, ACSM 03/20/2013 10:31 AM

## 2013-03-20 NOTE — Plan of Care (Signed)
Problem: Phase I Progression Outcomes Goal: EF % per last Echo/documented,Core Reminder form on chart Outcome: Completed/Met Date Met:  03/20/13 EF 20-25% from 03/18/2013

## 2013-03-21 DIAGNOSIS — D649 Anemia, unspecified: Secondary | ICD-10-CM

## 2013-03-21 DIAGNOSIS — E669 Obesity, unspecified: Secondary | ICD-10-CM

## 2013-03-21 DIAGNOSIS — E119 Type 2 diabetes mellitus without complications: Secondary | ICD-10-CM

## 2013-03-21 LAB — GLUCOSE, CAPILLARY
Glucose-Capillary: 144 mg/dL — ABNORMAL HIGH (ref 70–99)
Glucose-Capillary: 129 mg/dL — ABNORMAL HIGH (ref 70–99)

## 2013-03-21 LAB — BASIC METABOLIC PANEL
BUN: 28 mg/dL — ABNORMAL HIGH (ref 6–23)
CO2: 32 mEq/L (ref 19–32)
Chloride: 94 mEq/L — ABNORMAL LOW (ref 96–112)
Creatinine, Ser: 0.98 mg/dL (ref 0.50–1.10)
Potassium: 3.7 mEq/L (ref 3.5–5.1)

## 2013-03-21 MED ORDER — POTASSIUM CHLORIDE CRYS ER 20 MEQ PO TBCR: 40.0000 meq | EXTENDED_RELEASE_TABLET | Freq: Two times a day (BID) | ORAL | Status: DC

## 2013-03-21 NOTE — Progress Notes (Signed)
Christine Knight  62 y.o.  female  Subjective: Patient feels much better. Dyspnea resolved.  No orthopnea last night for the first time in a long time enabling her to sleep well.   Edema continues to decrease.   Allergy: Review of patient's allergies indicates no known allergies.  Objective: Vital signs in last 24 hours: Temp:  [98.2 F (36.8 C)-98.7 F (37.1 C)] 98.2 F (36.8 C) (05/11 0524) Pulse Rate:  [60-64] 64 (05/11 0524) Resp:  [18] 18 (05/11 0524) BP: (95-111)/(57-71) 95/57 mmHg (05/11 0524) SpO2:  [94 %-98 %] 98 % (05/11 0524) Weight:  [125.283 kg (276 lb 3.2 oz)] 125.283 kg (276 lb 3.2 oz) (05/11 0524)  125.283 kg (276 lb 3.2 oz) Body mass index is 43.25 kg/(m^2).  Weight change: -2.722 kg (-6 lb) Last BM Date: 03/18/13  Intake/Output from previous day: 05/10 0701 - 05/11 0700 In: 660 [P.O.:540; I.V.:120] Out: 5400 [Urine:5400] Total I/O since admission:  -11.5 L  General- Well developed; no acute distress; markedly overweight Neck-no JVD when sitting upright, no carotid bruits Lungs- decreased breath sounds at the bases-essentially clear; normal I:E ratio Cardiovascular-PMI not palpable; distant S1 and S2; no third heart sound appreciated Abdomen- normal bowel sounds; soft and non-tender without masses or organomegaly Skin- Warm, no significant lesions Extremities- Nl distal pulses; 2+ edema to just above the knees  Lab Results:  Recent Labs  03/20/13 0500 03/21/13 0500  NA 137 137  K 3.9 3.7  CL 97 94*  CO2 32 32  GLUCOSE 156* 160*  BUN 24* 28*  CREATININE 0.92 0.98  CALCIUM 8.9 9.1   EKG: Tracing performed 03/17/13 reviewed: Normal sinus rhythm; right ventricular conduction delay; possible prior septal MI; low-voltage; left posterior fascicular block; minor nonspecific ST-T wave abnormality; no previous tracing for comparison.  Echocardiogram 03/18/13: Moderate left ventricular enlargement; severe left ventricular dysfunction with EF 20-25%, moderate  pulmonary hypertension.  Imaging Studies/Results: CXR 03/17/2013: Stable cardiomegaly and vascular redistribution; no pulmonary edema.  Imaging: Imaging results have been reviewed  Medications:  I have reviewed the patient's current medications. Scheduled: . aspirin  325 mg Oral Daily  . carvedilol  12.5 mg Oral BID WC  . heparin  5,000 Units Subcutaneous Q8H  . insulin aspart  0-20 Units Subcutaneous TID WC  . insulin aspart  0-5 Units Subcutaneous QHS  . lisinopril  5 mg Oral BID  . potassium chloride SA  20 mEq Oral Daily  . sodium chloride  3 mL Intravenous Q12H  . spironolactone  25 mg Oral Daily   Infusions:   . furosemide (LASIX) infusion 10 mg/hr (03/20/13 0951)    Assessment/Plan: Congestive heart failure, systolic, acute on chronic: Impressive diuresis continues and has been well tolerated.  Blood pressure continues to trend down; remains adequate, but I am not inclined to increase her dose of beta blocker or ACE inhibitor.  Estimated dry weight of 255 pounds suggests considerable diuresis remains; will continue furosemide infusion for now.  Ischemic cardiomyopathy: No evidence for acute ischemia; myocardial infarction ruled out.  Borderline hypokalemia: Potassium level continues to drift down, but spironolactone added to medications yesterday; monitoring electrolytes and renal function.   LOS: 4 days   [place] Bing 03/21/2013, 8:52 AM

## 2013-03-22 DIAGNOSIS — R109 Unspecified abdominal pain: Secondary | ICD-10-CM

## 2013-03-22 LAB — BASIC METABOLIC PANEL
BUN: 28 mg/dL — ABNORMAL HIGH (ref 6–23)
Chloride: 95 mEq/L — ABNORMAL LOW (ref 96–112)
Creatinine, Ser: 1.03 mg/dL (ref 0.50–1.10)
GFR calc Af Amer: 67 mL/min — ABNORMAL LOW (ref >90)
GFR calc non Af Amer: 57 mL/min — ABNORMAL LOW (ref >90)
Glucose, Bld: 160 mg/dL — ABNORMAL HIGH (ref 70–99)
Potassium: 3.7 mEq/L (ref 3.5–5.1)

## 2013-03-22 LAB — GLUCOSE, CAPILLARY
Glucose-Capillary: 166 mg/dL — ABNORMAL HIGH (ref 70–99)
Glucose-Capillary: 206 mg/dL — ABNORMAL HIGH (ref 70–99)

## 2013-03-22 NOTE — Plan of Care (Signed)
Problem: Phase III Progression Outcomes Goal: Tolerating diet Outcome: Completed/Met Date Met:  03/22/13 Pt tolerating diet, no c/o n/v

## 2013-03-22 NOTE — Progress Notes (Signed)
CARDIAC REHAB PHASE I   PRE:  Rate/Rhythm: 71 SR with PVC    BP: sitting 104/50    SaO2: 95 RA  MODE:  Ambulation: 660 ft   POST:  Rate/Rhythm: 78 SR with PVCs    BP: sitting 90/60     SaO2: 92 RA  Tolerated fairly well, improving. Did not need to rest until she had walked 450 ft. BP lower after walk, denied dizziness. Sts she walked long distance yesterday but was tired afterward.  Will continue to follow. [id number]  Elissa Lovett [place] CES, ACSM 03/22/2013 10:04 AM

## 2013-03-22 NOTE — Progress Notes (Signed)
Advanced Heart Failure Rounding Note   Subjective:    62 y.o. female w/ PMHx significant for morbid obesity, systolic HF due to ischemic cardiomyopathy (10/12 EF 25%; 07/2012 EF 35%; 03/18/13 EF 20-25%), CAD with h/o NSTEMI s/p CABG Oct 2012, pulmonary hypertension (PA peak ), and poorly controlled diabetes mellitus type 2.  Admitted 03/17/13 from [facility] ED with recurrent HF. She was placed 40 mg IV lasix bid. Admit weight 293 pounds. (Baseline apparently near 240 pounds) Pertinent admission labs include: Pro BNP 1322, CEs negative, Magnesium 2.0, and creatinine 0.81.  ECHO 03/18/14 EF 20-25% Peak PA pressure 65 RV mildly elevated.  Started on lasix gtt at 10 on 5/10.  Down 8 pounds overnight, total 25 pounds total.  SBP 84-113  C/o nausea and stomach pain over the last couple of days.  Appetite diminished.  SOB with ambulation.  Legs feel tight but improving.    Objective:   Weight Range:  Vital Signs:   Temp:  [97.9 F (36.6 C)-99.3 F (37.4 C)] 99.1 F (37.3 C) (05/12 0700) Pulse Rate:  [65-95] 65 (05/12 0700) Resp:  [20-22] 22 (05/12 0700) BP: (84-113)/(52-74) 95/55 mmHg (05/12 0700) SpO2:  [95 %-96 %] 95 % (05/12 0700) Weight:  [268 lb 1.3 oz (121.6 kg)] 268 lb 1.3 oz (121.6 kg) (05/12 0700) Last BM Date: 03/21/13  Weight change: Filed Weights   03/20/13 0552 03/21/13 0524 03/22/13 0700  Weight: 282 lb 3.2 oz (128.005 kg) 276 lb 3.2 oz (125.283 kg) 268 lb 1.3 oz (121.6 kg)    Intake/Output:   Intake/Output Summary (Last 24 hours) at 03/22/13 0812 Last data filed at 03/22/13 0700  Gross per 24 hour  Intake    480 ml  Output   3225 ml  Net  -2745 ml     Physical Exam: General:  Well appearing. No resp difficulty HEENT: normal Neck: supple. JVP  jaw. Carotids 2+ bilat; no bruits. No lymphadenopathy or thryomegaly appreciated. Cor: PMI nondisplaced. Regular rate & rhythm. No rubs, gallops or murmurs. Lungs: clear Abdomen: soft, nontender, + distention.  No  hepatosplenomegaly. No bruits or masses. Good bowel sounds. Extremities: no cyanosis, clubbing, rash, 2+ lower extremity edema Neuro: alert & orientedx3, cranial nerves grossly intact. moves all 4 extremities w/o difficulty. Affect pleasant  Telemetry:   Labs: Basic Metabolic Panel:  Recent Labs Lab 03/17/13 1631 03/18/13 0350 03/19/13 0500 03/20/13 0500 03/21/13 0500 03/22/13 0505  NA  --  140 138 137 137 136  K  --  4.1 4.2 3.9 3.7 3.7  CL  --  103 101 97 94* 95*  CO2  --  27 28 32 32 34*  GLUCOSE  --  122* 135* 156* 160* 160*  BUN  --  23 24* 24* 28* 28*  CREATININE 0.81 0.83 0.82 0.92 0.98 1.03  CALCIUM  --  9.1 9.5 8.9 9.1 9.6  MG 2.0  --   --   --   --   --     Liver Function Tests: No results found for this basename: AST, ALT, ALKPHOS, BILITOT, PROT, ALBUMIN,  in the last 168 hours No results found for this basename: LIPASE, AMYLASE,  in the last 168 hours No results found for this basename: AMMONIA,  in the last 168 hours  CBC:  Recent Labs Lab 03/17/13 1152 03/17/13 1631  WBC 4.8 5.6  HGB 13.6 13.6  HCT 39.5 40.1  MCV 89.6 90.1  PLT 220 243    Cardiac Enzymes:  Recent Labs Lab  03/17/13 1631 03/17/13 2221 03/18/13 0350  TROPONINI <0.30 <0.30 <0.30    BNP: BNP (last 3 results)  Recent Labs  03/17/13 1152  PROBNP 1322.0*     Other results:  EKG:   Imaging:  No results found.   Medications:     Scheduled Medications: . aspirin  325 mg Oral Daily  . carvedilol  12.5 mg Oral BID WC  . heparin  5,000 Units Subcutaneous Q8H  . insulin aspart  0-20 Units Subcutaneous TID WC  . insulin aspart  0-5 Units Subcutaneous QHS  . lisinopril  5 mg Oral BID  . potassium chloride SA  20 mEq Oral Daily  . sodium chloride  3 mL Intravenous Q12H  . spironolactone  25 mg Oral Daily     Infusions: . furosemide (LASIX) infusion 10 mg/hr (03/20/13 0951)     PRN Medications:  sodium chloride, acetaminophen, ondansetron (ZOFRAN) IV, sodium  chloride, sodium chloride   Assessment:   1. Acute on chronic systolic congestive heart failure: EF 25% 03/18/13  2. Ischemic cardiomyopathy  --EF (10/12) 25%  --EF (9/13) 35%  --EF (03/18/13) 20-25%  with h/o NSTEMI: CEs negative  3. Pulmonary hypertension: Peak PA pressure 65  4. Mitral regurgitation, mild  5. CAD s/p CABG 2012  6. Diabetes Mellitus Type 2, uncontrolled: Hgb A1c 6.7  7. Morbid obesity  8. Acute on chronic respiratory failure  9. Probable obesity hypoventilation syndrome  Plan/Discussion:    Patient remains volume overloaded despite removal of 25 pounds.  Will continue with lasix gtt as tolerated.  Can give dose of metolazone today.  Continue carvedilol, lisinopril and spiro.  No room for titration with soft BP.    Will continue to monitor nausea, suspect it maybe gastritis.  Continue to work with cardiac rehab  Length of Stay: 5  Robbi Garter, [facility] 03/22/2013, 8:12 AM  Advanced Heart Failure Team Pager [phone] (M-F; 7a - 4p)  Please contact  Cardiology for night-coverage after hours (4p -7a ) and weekends on [email or web address]  Patient seen and examined with Ulyess Blossom, PA-C. We discussed all aspects of the encounter. I agree with the assessment and plan as stated above.   Continues to diurese well. Still with fluid on board. Continue current regimen.  Ab exam is benign. She is having regular BMs. Will check CMET and amylase/lipase.  Ashanti Ratti,MD 8:46 AM

## 2013-03-23 LAB — COMPREHENSIVE METABOLIC PANEL
ALT: 28 U/L (ref 0–35)
AST: 31 U/L (ref 0–37)
Alkaline Phosphatase: 155 U/L — ABNORMAL HIGH (ref 39–117)
Calcium: 9.4 mg/dL (ref 8.4–10.5)
Glucose, Bld: 154 mg/dL — ABNORMAL HIGH (ref 70–99)
Potassium: 3.8 mEq/L (ref 3.5–5.1)
Sodium: 136 mEq/L (ref 135–145)
Total Protein: 7.8 g/dL (ref 6.0–8.3)

## 2013-03-23 LAB — GLUCOSE, CAPILLARY: Glucose-Capillary: 181 mg/dL — ABNORMAL HIGH (ref 70–99)

## 2013-03-23 NOTE — Progress Notes (Signed)
Advanced Heart Failure Rounding Note   Subjective:    62 y.o. female w/ PMHx significant for morbid obesity, systolic HF due to ischemic cardiomyopathy (10/12 EF 25%; 07/2012 EF 35%; 03/18/13 EF 20-25%), CAD with h/o NSTEMI s/p CABG Oct 2012, pulmonary hypertension (PA peak ), and poorly controlled diabetes mellitus type 2.  Admitted 03/17/13 from [facility] ED with recurrent HF. She was placed 40 mg IV lasix bid. Admit weight 293 pounds. (Baseline apparently near 240 pounds) Pertinent admission labs include: Pro BNP 1322, CEs negative, Magnesium 2.0, and creatinine 0.81.  ECHO 03/18/14 EF 20-25% Peak PA pressure 65 RV mildly elevated.  Started on lasix gtt at 10 on 5/10.  Patient down another 2 pounds overnight, total 27 pounds total.  Diuresis beginning to slow. SBP 85-100. No longer complaining of nausea. Ambulating in the halls with no SOB. Denies orthopnea or CP.   Objective:   Weight Range:  Vital Signs:   Temp:  [98 F (36.7 C)-98.6 F (37 C)] 98.6 F (37 C) (05/13 0636) Pulse Rate:  [63-69] 65 (05/13 0636) Resp:  [16-20] 16 (05/13 0636) BP: (85-101)/(50-67) 100/67 mmHg (05/13 0635) SpO2:  [96 %-99 %] 96 % (05/13 0636) Weight:  [266 lb 12.1 oz (121 kg)] 266 lb 12.1 oz (121 kg) (05/13 0636) Last BM Date: 03/21/13  Weight change: Filed Weights   03/21/13 0524 03/22/13 0700 03/23/13 0636  Weight: 276 lb 3.2 oz (125.283 kg) 268 lb 1.3 oz (121.6 kg) 266 lb 12.1 oz (121 kg)    Intake/Output:   Intake/Output Summary (Last 24 hours) at 03/23/13 0844 Last data filed at 03/23/13 0700  Gross per 24 hour  Intake   1040 ml  Output   1550 ml  Net   -510 ml     Physical Exam: General:  Well appearing. No resp difficulty; laying in bed HEENT: normal Neck: supple. JVP hard to see appears 8-9. Carotids 2+ bilat; no bruits. No lymphadenopathy or thryomegaly appreciated. Cor: PMI nondisplaced. Regular rate & rhythm. No rubs, gallops or murmurs. Lungs: CTA diminished in bases Abdomen: soft,  nontender, + distention.  No hepatosplenomegaly. No bruits or masses. Good bowel sounds. Extremities: no cyanosis, clubbing, rash, tr-1++ lower extremity edema Neuro: alert & orientedx3, cranial nerves grossly intact. moves all 4 extremities w/o difficulty. Affect pleasant  Telemetry: SR with PVCs 60  Labs: Basic Metabolic Panel:  Recent Labs Lab 03/17/13 1631  03/19/13 0500 03/20/13 0500 03/21/13 0500 03/22/13 0505 03/23/13 0550  NA  --   < > 138 137 137 136 136  K  --   < > 4.2 3.9 3.7 3.7 3.8  CL  --   < > 101 97 94* 95* 92*  CO2  --   < > 28 32 32 34* 33*  GLUCOSE  --   < > 135* 156* 160* 160* 154*  BUN  --   < > 24* 24* 28* 28* 30*  CREATININE 0.81  < > 0.82 0.92 0.98 1.03 1.13*  CALCIUM  --   < > 9.5 8.9 9.1 9.6 9.4  MG 2.0  --   --   --   --   --   --   < > = values in this interval not displayed.  Liver Function Tests:  Recent Labs Lab 03/23/13 0550  AST 31  ALT 28  ALKPHOS 155*  BILITOT 0.7  PROT 7.8  ALBUMIN 3.4*    Recent Labs Lab 03/23/13 0550  LIPASE 44  AMYLASE 117*  No results found for this basename: AMMONIA,  in the last 168 hours  CBC:  Recent Labs Lab 03/17/13 1152 03/17/13 1631  WBC 4.8 5.6  HGB 13.6 13.6  HCT 39.5 40.1  MCV 89.6 90.1  PLT 220 243    Cardiac Enzymes:  Recent Labs Lab 03/17/13 1631 03/17/13 2221 03/18/13 0350  TROPONINI <0.30 <0.30 <0.30    BNP: BNP (last 3 results)  Recent Labs  03/17/13 1152  PROBNP 1322.0*     Other results:  EKG:   Imaging: No results found.   Medications:     Scheduled Medications: . aspirin  325 mg Oral Daily  . carvedilol  12.5 mg Oral BID WC  . heparin  5,000 Units Subcutaneous Q8H  . insulin aspart  0-20 Units Subcutaneous TID WC  . insulin aspart  0-5 Units Subcutaneous QHS  . lisinopril  5 mg Oral BID  . potassium chloride SA  20 mEq Oral Daily  . sodium chloride  3 mL Intravenous Q12H  . spironolactone  25 mg Oral Daily    Infusions: . furosemide  (LASIX) infusion 10 mg/hr (03/22/13 1502)    PRN Medications: sodium chloride, acetaminophen, ondansetron (ZOFRAN) IV, sodium chloride, sodium chloride   Assessment:   1. Acute on chronic systolic congestive heart failure: EF 25% 03/18/13  2. Ischemic cardiomyopathy  --EF (10/12) 25%  --EF (9/13) 35%  --EF (03/18/13) 20-25%  with h/o NSTEMI: CEs negative  3. Pulmonary hypertension: Peak PA pressure 65  4. Mitral regurgitation, mild  5. CAD s/p CABG 2012  6. Diabetes Mellitus Type 2, uncontrolled: Hgb A1c 6.7  7. Morbid obesity  8. Acute on chronic respiratory failure  9. Probable obesity hypoventilation syndrome  Plan/Discussion:    Continues to be volume overloaded. Will continue IV lasix gtt and give 5 mg metolazone today, continue to monitor Cr. SBP still remains soft will not titrate any medications. Continue to ambulate with CR.    Nausea improved and no longer complains of abdominal pain.   Length of Stay: 6  Aundria Rud, [facility] 03/23/2013, 8:44 AM  Advanced Heart Failure Team Pager [phone] (M-F; 7a - 4p)  Please contact [facility] for night-coverage after hours (4p -7a ) and weekends on [email or web address]   Patient seen and examined with Ulla Potash, NP. We discussed all aspects of the encounter. I agree with the assessment and plan as stated above. I think she is nearing euvolemia. Will continue lasix drip today as long as BP tolerates. Likely turn off tonight or tomorrow. Possibly home tomorrow or Thursday.   Reuel Boom Bensimhon,MD 3:42 PM

## 2013-03-23 NOTE — Plan of Care (Signed)
Problem: Phase III Progression Outcomes Goal: Dyspnea controlled with activity Outcome: Completed/Met Date Met:  03/23/13 Pt is oob ad lib, pt states her breathing feels much better, pt on room air o2 sats WNL

## 2013-03-24 LAB — GLUCOSE, CAPILLARY
Glucose-Capillary: 147 mg/dL — ABNORMAL HIGH (ref 70–99)
Glucose-Capillary: 198 mg/dL — ABNORMAL HIGH (ref 70–99)
Glucose-Capillary: 162 mg/dL — ABNORMAL HIGH (ref 70–99)

## 2013-03-24 LAB — BASIC METABOLIC PANEL
BUN: 29 mg/dL — ABNORMAL HIGH (ref 6–23)
CO2: 32 mEq/L (ref 19–32)
Chloride: 93 mEq/L — ABNORMAL LOW (ref 96–112)
Creatinine, Ser: 1 mg/dL (ref 0.50–1.10)
Glucose, Bld: 121 mg/dL — ABNORMAL HIGH (ref 70–99)
Potassium: 3.6 mEq/L (ref 3.5–5.1)

## 2013-03-24 MED ORDER — FUROSEMIDE 80 MG PO TABS
80.0000 mg | ORAL_TABLET | Freq: Two times a day (BID) | ORAL | Status: DC
  Administered 2013-03-24 – 2013-03-25 (×2): 80 mg via ORAL
  Filled 2013-03-24 (×4): qty 1

## 2013-03-24 NOTE — Progress Notes (Signed)
Advanced Heart Failure Rounding Note   Subjective:    62 y.o. female w/ PMHx significant for morbid obesity, systolic HF due to ischemic cardiomyopathy (10/12 EF 25%; 07/2012 EF 35%; 03/18/13 EF 20-25%), CAD with h/o NSTEMI s/p CABG Oct 2012, pulmonary hypertension (PA peak ), and poorly controlled diabetes mellitus type 2.  Admitted 03/17/13 from [facility] ED with recurrent HF. She was placed 40 mg IV lasix bid. Admit weight 293 pounds. (Baseline apparently near 240 pounds) Pertinent admission labs include: Pro BNP 1322, CEs negative, Magnesium 2.0, and creatinine 0.81.  ECHO 03/18/14 EF 20-25% Peak PA pressure 65 RV mildly elevated.  Started on lasix gtt at 10 on 5/10.  Patient down another 1 pound overnight, total 28 pounds total.  UOP -2 L.  SBP 100s. No more nausea. Ambulating in the halls with no SOB.  No orthopnea or CP.   Objective:   Weight Range:  Vital Signs:   Temp:  [98.3 F (36.8 C)-99 F (37.2 C)] 98.3 F (36.8 C) (05/14 0552) Pulse Rate:  [57-69] 63 (05/14 0552) Resp:  [18] 18 (05/14 0552) BP: (100-122)/(57-89) 100/67 mmHg (05/14 0730) SpO2:  [97 %-99 %] 98 % (05/14 0552) Weight:  [265 lb 3.4 oz (120.3 kg)] 265 lb 3.4 oz (120.3 kg) (05/14 0552) Last BM Date: 03/23/13  Weight change: Filed Weights   03/22/13 0700 03/23/13 0636 03/24/13 0552  Weight: 268 lb 1.3 oz (121.6 kg) 266 lb 12.1 oz (121 kg) 265 lb 3.4 oz (120.3 kg)    Intake/Output:   Intake/Output Summary (Last 24 hours) at 03/24/13 1208 Last data filed at 03/24/13 0900  Gross per 24 hour  Intake    720 ml  Output   2050 ml  Net  -1330 ml     Physical Exam: General:  Well appearing. No resp difficulty; lying in bed HEENT: normal Neck: supple. JVP hard to see appears down. Carotids 2+ bilat; no bruits. No lymphadenopathy or thryomegaly appreciated. Cor: PMI nondisplaced. Regular rate & rhythm. No rubs, gallops or murmurs. Lungs: CTA diminished in bases Abdomen: soft, nontender, obese. no distention.  No  hepatosplenomegaly. No bruits or masses. Good bowel sounds. Extremities: no cyanosis, clubbing, rash, tr lower extremity edema Neuro: alert & orientedx3, cranial nerves grossly intact. moves all 4 extremities w/o difficulty. Affect pleasant  Telemetry: SR with PVCs 60  Labs: Basic Metabolic Panel:  Recent Labs Lab 03/17/13 1631  03/20/13 0500 03/21/13 0500 03/22/13 0505 03/23/13 0550 03/24/13 0500  NA  --   < > 137 137 136 136 135  K  --   < > 3.9 3.7 3.7 3.8 3.6  CL  --   < > 97 94* 95* 92* 93*  CO2  --   < > 32 32 34* 33* 32  GLUCOSE  --   < > 156* 160* 160* 154* 121*  BUN  --   < > 24* 28* 28* 30* 29*  CREATININE 0.81  < > 0.92 0.98 1.03 1.13* 1.00  CALCIUM  --   < > 8.9 9.1 9.6 9.4 9.3  MG 2.0  --   --   --   --   --   --   < > = values in this interval not displayed.  Liver Function Tests:  Recent Labs Lab 03/23/13 0550  AST 31  ALT 28  ALKPHOS 155*  BILITOT 0.7  PROT 7.8  ALBUMIN 3.4*    Recent Labs Lab 03/23/13 0550  LIPASE 44  AMYLASE 117*  No results found for this basename: AMMONIA,  in the last 168 hours  CBC:  Recent Labs Lab 03/17/13 1631  WBC 5.6  HGB 13.6  HCT 40.1  MCV 90.1  PLT 243    Cardiac Enzymes:  Recent Labs Lab 03/17/13 1631 03/17/13 2221 03/18/13 0350  TROPONINI <0.30 <0.30 <0.30    BNP: BNP (last 3 results)  Recent Labs  03/17/13 1152  PROBNP 1322.0*      Medications:     Scheduled Medications: . aspirin  325 mg Oral Daily  . carvedilol  12.5 mg Oral BID WC  . heparin  5,000 Units Subcutaneous Q8H  . insulin aspart  0-20 Units Subcutaneous TID WC  . insulin aspart  0-5 Units Subcutaneous QHS  . lisinopril  5 mg Oral BID  . potassium chloride SA  20 mEq Oral Daily  . sodium chloride  3 mL Intravenous Q12H  . spironolactone  25 mg Oral Daily    Infusions: . furosemide (LASIX) infusion 10 mg/hr (03/23/13 1749)    PRN Medications: sodium chloride, acetaminophen, ondansetron (ZOFRAN) IV, sodium  chloride, sodium chloride   Assessment:   1. Acute on chronic systolic congestive heart failure: EF 25% 03/18/13  2. Ischemic cardiomyopathy  --EF (10/12) 25%  --EF (9/13) 35%  --EF (03/18/13) 20-25%  with h/o NSTEMI: CEs negative  3. Pulmonary hypertension: Peak PA pressure 65  4. Mitral regurgitation, mild  5. CAD s/p CABG 2012  6. Diabetes Mellitus Type 2, uncontrolled: Hgb A1c 6.7  7. Morbid obesity  8. Acute on chronic respiratory failure  9. Probable obesity hypoventilation syndrome  Plan/Discussion:    Volume status looks good.  Will transition back to po lasix 80 mg BID.  If weight remains stable will discharge tomorrow. BP soft therefore will not titrate medications at this time.  Further med titration in outpatient setting.    Length of Stay: 7  Robbi Garter, [facility] 03/24/2013, 12:08 PM  Advanced Heart Failure Team Pager [phone] (M-F; 7a - 4p)  Please contact [facility] for night-coverage after hours (4p -7a ) and weekends on [email or web address]  Patient seen and examined with Ulyess Blossom, PA-C. We discussed all aspects of the encounter. I agree with the assessment and plan as stated above.   Volume status looks much better. She appears euvolemic. Agree with switching back to po lasix. May need demadex in future. Will follow in HF clinic. Reinforced need for daily weights and reviewed use of sliding scale diuretics. Hopefully home in am.    Truman Hayward 4:18 PM

## 2013-03-24 NOTE — Progress Notes (Signed)
UR complete.  Milt Coye RN, MSN 

## 2013-03-24 NOTE — Plan of Care (Addendum)
Problem: Phase II Progression Outcomes Goal: Fluid volume status improved Outcome: Progressing Pt still has bilateral lower extremely edema, swelling is down, pt diuresing well  Problem: Phase III Progression Outcomes Goal: Activity at appropriate level-compared to baseline Outcome: Completed/Met Date Met:  03/24/13 Pt oob ad lib, pt activity is at baseline, pt states breathing is better

## 2013-03-24 NOTE — Progress Notes (Addendum)
CARDIAC REHAB PHASE I   PRE:  Rate/Rhythm: 62 SR  BP:  Supine:   Sitting: 100/70  Standing:    SaO2: 98 RA  MODE:  Ambulation: 460 ft   POST:  Rate/Rhythm: 75  BP:  Supine:   Sitting: 104/70  Standing:    SaO2: 92 RA 1015-1040 Pt tolerated ambulation well without c/o. Gait steady VS stable. Some DOE noted but pt did not c/o. Pt back to recliner after walk with call light in reach. Completed CHF education with pt Discussed daily weights, low sodium diet , zones and when to call MD and 911. Pt has CHF packet and states that she has watched CHF video twice. She declines Outpt. CRP due to her work hours.  Melina Copa RN 03/24/2013 10:40 AM

## 2013-03-25 LAB — BASIC METABOLIC PANEL
BUN: 31 mg/dL — ABNORMAL HIGH (ref 6–23)
Calcium: 9.2 mg/dL (ref 8.4–10.5)
Chloride: 93 mEq/L — ABNORMAL LOW (ref 96–112)
Creatinine, Ser: 1.13 mg/dL — ABNORMAL HIGH (ref 0.50–1.10)
GFR calc Af Amer: 60 mL/min — ABNORMAL LOW (ref >90)

## 2013-03-25 LAB — GLUCOSE, CAPILLARY: Glucose-Capillary: 160 mg/dL — ABNORMAL HIGH (ref 70–99)

## 2013-03-25 MED ORDER — SPIRONOLACTONE 25 MG PO TABS: 25.0000 mg | ORAL_TABLET | Freq: Every day | ORAL | Status: DC

## 2013-03-25 MED ORDER — LISINOPRIL 5 MG PO TABS: 5.0000 mg | ORAL_TABLET | Freq: Two times a day (BID) | ORAL | Status: DC

## 2013-03-25 MED ORDER — FUROSEMIDE 80 MG PO TABS: 80.0000 mg | ORAL_TABLET | Freq: Two times a day (BID) | ORAL | Status: DC

## 2013-03-25 NOTE — Discharge Summary (Signed)
Advanced Heart Failure Team  Discharge Summary   Patient ID: Christine Knight MRN: [id number], DOB/AGE: 1951/10/30 62 y.o. Admit date: 03/17/2013 D/C date:     03/25/2013   Primary Discharge Diagnoses:  1. Acute on chronic systolic congestive heart failure: EF 25% 03/18/13  2. Ischemic cardiomyopathy, with h/o NSTEMI 3. Pulmonary hypertension: Peak PA pressure 65 4.  Acute on chronic respiratory failure 5. Probable obesity hypoventilation syndrome  Secondary Discharge Diagnoses:  1. Mitral regurgitation, mild  2. CAD s/p CABG 2012 3. Diabetes Mellitus Type 2, uncontrolled: Hgb A1c 6.7 4. Morbid obesity  Hospital Course: Christine Knight is a 62 y.o. female w/ PMHx significant for morbid obesity, systolic HF due to ischemic cardiomyopathy (10/12 EF 25%; 07/2012 EF 35%; 03/18/13 EF 20-25%), CAD with h/o NSTEMI s/p CABG Oct 2012, pulmonary hypertension (PA peak ), and poorly controlled diabetes mellitus type 2.  She presented to the ED on 03/17/13 for increased SOB, bilateral leg edema, and weight gain over the last couple of months (about 50 pounds). In the ED she received 40 mg IV lasix BID. On admission pertinent labs include: Pro BNP 1322, CEs negative, Magnesium 2.0, and creatinine 0.81. Admission weight was 293 lbs.   During her hospital stay she was placed on a lasix gtt with great diuresis. She is -15 Liters and down 29 pounds total. On 03/24/13 she was transitioned to PO lasix BID and weight remained stable. If in the future she is unable to maintain weight could consider switching to demadex. She remained on a BB, ACE-I and Cleda Daub with no titration due to hypotension. On exam she denies SOB, orthopnea, CP or PND.   Christine Knight will be discharged home with close follow up in the HF clinic next week. We will continue to titrate medications as tolerated on the outpatient side. Upon discharge her physical exam was as below:    Physical Exam:  General: Well appearing. NAD; laying in bed  HEENT:  normal  Neck: supple. JVP hard to see appears 7-8. Carotids 2+ bilat; no bruits. No lymphadenopathy or thryomegaly appreciated.  Cor: PMI nondisplaced. Regular rate & rhythm. No rubs, gallops or murmurs.  Lungs: CTA diminished in bases  Abdomen: soft, nontender, + distention. No hepatosplenomegaly. No bruits or masses. +BS.  Extremities: no cyanosis, clubbing, rash, no edema  Neuro: alert & orientedx3, cranial nerves grossly intact. moves all 4 extremities w/o difficulty. Affect pleasant     Discharge Weight Range: 264-266 lbs Discharge Vitals: Blood pressure 102/75, pulse 59, temperature 98.3 F (36.8 C), temperature source Oral, resp. rate 18, height 5\' 7"  (1.702 m), weight 264 lb 5.3 oz (119.9 kg), SpO2 96.00%.  Labs: Lab Results  Component Value Date   WBC 5.6 03/17/2013   HGB 13.6 03/17/2013   HCT 40.1 03/17/2013   MCV 90.1 03/17/2013   PLT 243 03/17/2013    Recent Labs Lab 03/23/13 0550  03/25/13 0634  NA 136  < > 133*  K 3.8  < > 4.2  CL 92*  < > 93*  CO2 33*  < > 26  BUN 30*  < > 31*  CREATININE 1.13*  < > 1.13*  CALCIUM 9.4  < > 9.2  PROT 7.8  --   --   BILITOT 0.7  --   --   ALKPHOS 155*  --   --   ALT 28  --   --   AST 31  --   --   GLUCOSE 154*  < > 157*  < > =  values in this interval not displayed. Lab Results  Component Value Date   CHOL 174 01/30/2012   HDL 51.00 01/30/2012   LDLCALC 112* 01/30/2012   TRIG 57.0 01/30/2012   BNP (last 3 results)  Recent Labs  03/17/13 1152  PROBNP 1322.0*    Diagnostic Studies/Procedures   No results found.  Discharge Medications     Medication List    ASK your doctor about these medications       ACCU-CHEK SMARTVIEW test strip  Generic drug:  glucose blood     aspirin 325 MG EC tablet  Take 325 mg by mouth daily.     carvedilol 12.5 MG tablet  Commonly known as:  COREG  Take 1 tablet (12.5 mg total) by mouth 2 (two) times daily.     furosemide 40 MG tablet  Commonly known as:  LASIX  Take 40 mg by mouth  2 (two) times daily.     glipiZIDE 5 MG tablet  Commonly known as:  GLUCOTROL  Take 1 tablet (5 mg total) by mouth 2 (two) times daily before a meal.     lisinopril 10 MG tablet  Commonly known as:  PRINIVIL,ZESTRIL  Take 1 tablet (10 mg total) by mouth daily.     potassium chloride SA 20 MEQ tablet  Commonly known as:  K-DUR,KLOR-CON  Take 1 tablet (20 mEq total) by mouth daily.        Disposition   The patient will be discharged in stable condition to home.      Future Appointments Provider Department Dept Phone   03/31/2013 4:00 PM [facility] [phone]     Follow-up Information   Follow up with Arvilla Meres, MD On 03/31/2013. (@ 4:00 pm; garage code [id number])    Contact information:   [address] [place] [address] [phone]         Duration of Discharge Encounter: Greater than 35 minutes   Signed, Aundria Rud  03/25/2013, 9:46 AM   Patient seen and examined with Ulla Potash, NP. We discussed all aspects of the encounter. I agree with the assessment and plan as stated above. Volume status much better. Will d/c home today on lasix 80 po bid (up from 40 bid). Reinforced need for daily weights and reviewed use of sliding scale diuretics. See in HF clinic. If not stable can switch to demadex 40 bid.   Alina Gilkey,MD 1:46 PM

## 2013-03-26 ENCOUNTER — Other Ambulatory Visit (HOSPITAL_COMMUNITY): Payer: Self-pay | Admitting: *Deleted

## 2013-03-26 DIAGNOSIS — I255 Ischemic cardiomyopathy: Secondary | ICD-10-CM

## 2013-03-26 DIAGNOSIS — I2510 Atherosclerotic heart disease of native coronary artery without angina pectoris: Secondary | ICD-10-CM

## 2013-03-26 MED ORDER — LISINOPRIL 5 MG PO TABS: 5.0000 mg | ORAL_TABLET | Freq: Two times a day (BID) | ORAL | Status: DC

## 2013-03-26 MED ORDER — CARVEDILOL 12.5 MG PO TABS: 12.5000 mg | ORAL_TABLET | Freq: Two times a day (BID) | ORAL | Status: DC

## 2013-03-26 MED ORDER — SPIRONOLACTONE 25 MG PO TABS: 25.0000 mg | ORAL_TABLET | Freq: Every day | ORAL | Status: DC

## 2013-03-26 MED ORDER — POTASSIUM CHLORIDE CRYS ER 20 MEQ PO TBCR: 20.0000 meq | EXTENDED_RELEASE_TABLET | Freq: Every day | ORAL | Status: DC

## 2013-03-26 MED ORDER — FUROSEMIDE 80 MG PO TABS: 80.0000 mg | ORAL_TABLET | Freq: Two times a day (BID) | ORAL | Status: DC

## 2013-03-26 NOTE — Progress Notes (Signed)
Utilization Review Completed.   Amanii Snethen, RN, BSN Nurse Case Manager  [phone]  

## 2013-03-30 NOTE — Progress Notes (Signed)
Utilization Review Completed.   Kalei Meda, RN, BSN Nurse Case Manager  [phone]  

## 2013-03-31 ENCOUNTER — Encounter (HOSPITAL_COMMUNITY): Payer: Self-pay

## 2013-03-31 ENCOUNTER — Ambulatory Visit (HOSPITAL_COMMUNITY)
Admit: 2013-03-31 | Discharge: 2013-03-31 | Disposition: A | Discharge status: Home / Self Care | Payer: Managed Care, Other (non HMO) | Source: Ambulatory Visit | Attending: Internal Medicine | Admitting: Internal Medicine

## 2013-03-31 VITALS — BP 112/68 | HR 79 | Resp 19 | Ht 67.0 in | Wt 270.0 lb

## 2013-03-31 DIAGNOSIS — I5022 Chronic systolic (congestive) heart failure: Secondary | ICD-10-CM

## 2013-03-31 DIAGNOSIS — I509 Heart failure, unspecified: Secondary | ICD-10-CM | POA: Insufficient documentation

## 2013-03-31 DIAGNOSIS — I2510 Atherosclerotic heart disease of native coronary artery without angina pectoris: Secondary | ICD-10-CM

## 2013-03-31 LAB — BASIC METABOLIC PANEL WITH GFR
BUN: 51 mg/dL — ABNORMAL HIGH (ref 6–23)
CO2: 21 meq/L (ref 19–32)
Calcium: 9.5 mg/dL (ref 8.4–10.5)
Chloride: 98 meq/L (ref 96–112)
Creatinine, Ser: 1.1 mg/dL (ref 0.50–1.10)
GFR calc Af Amer: 61 mL/min — ABNORMAL LOW
GFR calc non Af Amer: 53 mL/min — ABNORMAL LOW
Glucose, Bld: 117 mg/dL — ABNORMAL HIGH (ref 70–99)
Potassium: 5.2 meq/L — ABNORMAL HIGH (ref 3.5–5.1)
Sodium: 134 meq/L — ABNORMAL LOW (ref 135–145)

## 2013-03-31 MED ORDER — TORSEMIDE 20 MG PO TABS: 40.0000 mg | ORAL_TABLET | Freq: Two times a day (BID) | ORAL | Status: DC

## 2013-03-31 NOTE — Progress Notes (Signed)
HPI: Ms.Barra is a 62 y.o. female w/ PMHx significant for morbid obesity, systolic HF due to ischemic cardiomyopathy (10/12 EF 25%; 07/2012 EF 35%; 03/18/13 EF 20-25%), CAD with h/o NSTEMI s/p CABG Oct 2012, pulmonary hypertension (PA peak ), and poorly controlled diabetes mellitus type 2.  She was admitted to [facility] for dyspnea, leg edema, and weight gain.  Lasix gtt used and he diuresed 15 liters. Discharge weight 264 pounds.  She returns for post hospital follow up today.  Feels well today.  She is walking  20 min twice daily and gets tired by the end of it.  No orthopnea/PND.  No edema but says weight has climbed 6 pounds since discharge.  264->270 pounds.  She is watching fluid closely and only taking in 1.5 L a day.  She feels her urine output has diminished greatly.  She hasn't started spiro yet due to it is mail order, it should be here today.  She is back to working daily.     ROS: All systems negative except as listed in HPI, PMH and Problem List.  Past Medical History  Diagnosis Date  . Obesity, morbid   . Chronic systolic heart failure     Chronic systolic CHF  . Hyperlipidemia 08/10/11  . Diabetes mellitus type 2 in obese 08/10/11  . CAD (coronary artery disease) 08/2011    NSTEMI with subsequent CABG 08/13/2011 with LIMA-LAD, SVG-diagonal, SVG-OM, SVG-PDA  . Ischemic cardiomyopathy 08/13/2011    EF 20% in 08/2011, still 25% 10/21/2011  . Pleural effusion     Requiring L thoracentesis 09/02/11  . Mitral regurgitation     Mild by echo 10/21/11  . Hypertension   . Pulmonary hypertension     Echo, September, 2013, 72 mmHg.  Marland Kitchen Ejection fraction < 50%     EF 20%, October, 2012  //  EF 25% December, 2012  //   EF 35%, echo, September, 2013  . Myocardial infarction 08/2011  . Orthopnoea     "progressively worse over last 3 wks" (03/17/2013)  . CHF (congestive heart failure)   . Carpal tunnel syndrome on both sides     "post OHS in 08/2011; resolved now" (03/17/2013)    Current  Outpatient Prescriptions  Medication Sig Dispense Refill  . ACCU-CHEK SMARTVIEW test strip       . aspirin 325 MG EC tablet Take 325 mg by mouth daily.        . carvedilol (COREG) 12.5 MG tablet Take 1 tablet (12.5 mg total) by mouth 2 (two) times daily.  180 tablet  3  . furosemide (LASIX) 80 MG tablet Take 1 tablet (80 mg total) by mouth 2 (two) times daily.  180 tablet  3  . glipiZIDE (GLUCOTROL) 5 MG tablet Take 1 tablet (5 mg total) by mouth 2 (two) times daily before a meal.  180 tablet  3  . lisinopril (PRINIVIL,ZESTRIL) 5 MG tablet Take 1 tablet (5 mg total) by mouth 2 (two) times daily.  180 tablet  3  . potassium chloride SA (K-DUR,KLOR-CON) 20 MEQ tablet Take 1 tablet (20 mEq total) by mouth daily.  90 tablet  3  . spironolactone (ALDACTONE) 25 MG tablet Take 1 tablet (25 mg total) by mouth daily.  90 tablet  3  . [DISCONTINUED] simvastatin (ZOCOR) 20 MG tablet Take 1 tablet (20 mg total) by mouth every evening.  90 tablet  1   No current facility-administered medications for this encounter.     PHYSICAL EXAM: Filed Vitals:  03/31/13 1550  BP: 112/68  Pulse: 79  Resp: 19  Height: 5\' 7"  (1.702 m)  Weight: 270 lb (122.471 kg)  SpO2: 95%    General:  Well appearing. No resp difficulty HEENT: normal Neck: supple. JVP hard to assess. Carotids 2+ bilaterally; no bruits. No lymphadenopathy or thryomegaly appreciated. Cor: PMI normal. Regular rate & rhythm. No rubs, gallops or murmurs. Lungs: clear Abdomen: soft, nontender, nondistended. No hepatosplenomegaly. No bruits or masses. Good bowel sounds. Extremities: no cyanosis, clubbing, rash, tr lower extremity edema Neuro: alert & orientedx3, cranial nerves grossly intact. Moves all 4 extremities w/o difficulty. Affect pleasant.     ASSESSMENT & PLAN:

## 2013-03-31 NOTE — Patient Instructions (Addendum)
Stop lasix  Start torsemide 40 mg (2 tabs) twice daily  Labs today  Follow up 3 weeks.

## 2013-04-02 DIAGNOSIS — I5022 Chronic systolic (congestive) heart failure: Secondary | ICD-10-CM | POA: Insufficient documentation

## 2013-04-02 NOTE — Assessment & Plan Note (Signed)
NYHA II.  Volume status climbing 6 pounds since discharge and urine output down.  Will change lasix to torsemide 40 mg BID for better absorption.  Have discussed need to call clinic if weight is not returning to baseline.  Have also talked about low sodium diet and fluid restrictions, she says she is working on both.  Will not titrate other HF meds with change in torsemide.  Continue carvedilol, lisinopril and spiro.  Check BMET today.  Continue to follow in clinic and reassess echo in 3 months.

## 2013-04-02 NOTE — Assessment & Plan Note (Signed)
No ischemic symptoms.  Continue ASA

## 2013-04-06 ENCOUNTER — Telehealth (HOSPITAL_COMMUNITY): Payer: Self-pay | Admitting: *Deleted

## 2013-04-06 DIAGNOSIS — I5022 Chronic systolic (congestive) heart failure: Secondary | ICD-10-CM

## 2013-04-06 NOTE — Telephone Encounter (Signed)
Message copied by Noralee Space on Tue Apr 06, 2013  4:46 PM ------      Message from: Hadassah Pais      Created: Thu Apr 01, 2013  8:09 AM       Stop potassium.  Recheck early next week. ------

## 2013-04-09 ENCOUNTER — Other Ambulatory Visit: Payer: Managed Care, Other (non HMO)

## 2013-04-22 ENCOUNTER — Ambulatory Visit (HOSPITAL_COMMUNITY)
Admission: RE | Admit: 2013-04-22 | Discharge: 2013-04-22 | Disposition: A | Discharge status: Home / Self Care | Payer: Managed Care, Other (non HMO) | Source: Ambulatory Visit | Attending: Internal Medicine | Admitting: Internal Medicine

## 2013-04-22 ENCOUNTER — Encounter (HOSPITAL_COMMUNITY): Payer: Self-pay

## 2013-04-22 ENCOUNTER — Telehealth (HOSPITAL_COMMUNITY): Payer: Self-pay | Admitting: Adult Health

## 2013-04-22 VITALS — BP 90/66 | HR 67 | Wt 266.4 lb

## 2013-04-22 DIAGNOSIS — E119 Type 2 diabetes mellitus without complications: Secondary | ICD-10-CM | POA: Insufficient documentation

## 2013-04-22 DIAGNOSIS — I10 Essential (primary) hypertension: Secondary | ICD-10-CM | POA: Insufficient documentation

## 2013-04-22 DIAGNOSIS — I5022 Chronic systolic (congestive) heart failure: Secondary | ICD-10-CM

## 2013-04-22 DIAGNOSIS — E669 Obesity, unspecified: Secondary | ICD-10-CM | POA: Insufficient documentation

## 2013-04-22 DIAGNOSIS — I2510 Atherosclerotic heart disease of native coronary artery without angina pectoris: Secondary | ICD-10-CM | POA: Insufficient documentation

## 2013-04-22 DIAGNOSIS — E785 Hyperlipidemia, unspecified: Secondary | ICD-10-CM | POA: Insufficient documentation

## 2013-04-22 LAB — BASIC METABOLIC PANEL
Calcium: 9.2 mg/dL (ref 8.4–10.5)
Creatinine, Ser: 1.32 mg/dL — ABNORMAL HIGH (ref 0.50–1.10)
GFR calc Af Amer: 49 mL/min — ABNORMAL LOW (ref >90)
Sodium: 130 mEq/L — ABNORMAL LOW (ref 135–145)

## 2013-04-22 MED ORDER — SPIRONOLACTONE 25 MG PO TABS: 12.5000 mg | ORAL_TABLET | Freq: Every day | ORAL | Status: DC

## 2013-04-22 NOTE — Assessment & Plan Note (Signed)
NYHA II. Volume improved but mildly elevated which is likely due to increased fluid intake from fruit. Continue current diuretic regimen. Will not titrate HF  meds today as BP is soft. Check BMET today if potassium increased will cut back spironolactone to 12.5 mg daily. Plan to repeat ECHO in late July after HF medications optimized. Reinforced limiting fluid intake which includes fruits such as watermelon, daily weights, and low salt food choices. Follow up in 3 weeks.

## 2013-04-22 NOTE — Patient Instructions (Addendum)
Follow up in 3 weeks  Do the following things EVERYDAY: 1) Weigh yourself in the morning before breakfast. Write it down and keep it in a log. 2) Take your medicines as prescribed 3) Eat low salt foods-Limit salt (sodium) to 2000 mg per day.  4) Stay as active as you can everyday 5) Limit all fluids for the day to less than 2 liters 

## 2013-04-22 NOTE — Telephone Encounter (Signed)
Attempted to call with lab results.  Will need to decrease spironolactone to 12.5 mg daily.   CLEGG,AMY 4:10 PM

## 2013-04-22 NOTE — Progress Notes (Signed)
Patient ID: Christine Knight, female   DOB: 1951/02/13, 62 y.o.   MRN: [id number] HPI: Christine Knight is a 62 y.o. female w/ PMHx significant for morbid obesity, systolic HF due to ICM (10/12 EF 25%; 07/2012 EF 35%; 03/18/13 EF 20-25%), CAD with h/o NSTEMI s/p CABG Oct 2012, pulmonary hypertension (PA peak ), and poorly controlled DM 2.  She was admitted to [facility] for dyspnea, leg edema, and weight gain.  Lasix gtt used and she diuresed 15 liters. ECHO- EF 20-25%.  Discharge weight 264 pounds.   She returns for follow up today.  Last visit lasix stopped and she was started on Torsemide 40 mg twice a day. Denies SOB/PND/Orthopnea/CP. Denies dizziness.Compliant with medications.  Weight at home trending down from 270 to 263 pounds. Works full time. Tries to follow low salt diet. She limits fluid intake to < 2 liters. Eats 2 entire watermelons a week. Uses tread mill 3-4 times a week at a speed of 2.5.    03/18/13 ECHO EF 20-25%   ROS: All systems negative except as listed in HPI, PMH and Problem List.  Past Medical History  Diagnosis Date  . Obesity, morbid   . Chronic systolic heart failure     Chronic systolic CHF  . Hyperlipidemia 08/10/11  . Diabetes mellitus type 2 in obese 08/10/11  . CAD (coronary artery disease) 08/2011    NSTEMI with subsequent CABG 08/13/2011 with LIMA-LAD, SVG-diagonal, SVG-OM, SVG-PDA  . Ischemic cardiomyopathy 08/13/2011    EF 20% in 08/2011, still 25% 10/21/2011  . Pleural effusion     Requiring L thoracentesis 09/02/11  . Mitral regurgitation     Mild by echo 10/21/11  . Hypertension   . Pulmonary hypertension     Echo, September, 2013, 72 mmHg.  Marland Kitchen Ejection fraction < 50%     EF 20%, October, 2012  //  EF 25% December, 2012  //   EF 35%, echo, September, 2013  . Myocardial infarction 08/2011  . Orthopnoea     "progressively worse over last 3 wks" (03/17/2013)  . CHF (congestive heart failure)   . Carpal tunnel syndrome on both sides     "post OHS in 08/2011;  resolved now" (03/17/2013)    Current Outpatient Prescriptions  Medication Sig Dispense Refill  . ACCU-CHEK SMARTVIEW test strip       . aspirin 325 MG EC tablet Take 325 mg by mouth daily.        . carvedilol (COREG) 12.5 MG tablet Take 1 tablet (12.5 mg total) by mouth 2 (two) times daily.  180 tablet  3  . glipiZIDE (GLUCOTROL) 5 MG tablet Take 1 tablet (5 mg total) by mouth 2 (two) times daily before a meal.  180 tablet  3  . lisinopril (PRINIVIL,ZESTRIL) 5 MG tablet Take 1 tablet (5 mg total) by mouth 2 (two) times daily.  180 tablet  3  . spironolactone (ALDACTONE) 25 MG tablet Take 1 tablet (25 mg total) by mouth daily.  90 tablet  3  . torsemide (DEMADEX) 20 MG tablet Take 2 tablets (40 mg total) by mouth 2 (two) times daily.  360 tablet  1  . [DISCONTINUED] simvastatin (ZOCOR) 20 MG tablet Take 1 tablet (20 mg total) by mouth every evening.  90 tablet  1   No current facility-administered medications for this encounter.     PHYSICAL EXAM: Filed Vitals:   04/22/13 1433  BP: 90/66  Pulse: 67  Weight: 266 lb 6.4 oz (120.838 kg)  SpO2: 98%  General:  Well appearing. No resp difficulty HEENT: normal Neck: supple. JVP hard to assess. Carotids 2+ bilaterally; no bruits. No lymphadenopathy or thryomegaly appreciated. Cor: PMI normal. Regular rate & rhythm. No rubs, gallops or murmurs. Lungs: clear Abdomen: soft, nontender, nondistended. No hepatosplenomegaly. No bruits or masses. Good bowel sounds. Extremities: no cyanosis, clubbing, rash, tr lower extremity edema Neuro: alert & orientedx3, cranial nerves grossly intact. Moves all 4 extremities w/o difficulty. Affect pleasant.     ASSESSMENT & PLAN:

## 2013-04-27 ENCOUNTER — Telehealth (HOSPITAL_COMMUNITY): Payer: Self-pay | Admitting: Adult Health

## 2013-04-27 NOTE — Telephone Encounter (Signed)
Provided lab results. Potassium elevated. 5.2  Recommended to cut spironolactone to 12.5 mg daily however she is complaining of dizziness that she believes is from the Spironolactone.   Stop spironolactone. Follow up 05/11/13. Repeat BEMT at that time.   Instructed to call back if dizziness persists.   Christine Knight verbalized understanding.

## 2013-05-11 ENCOUNTER — Encounter (HOSPITAL_COMMUNITY): Payer: Self-pay

## 2013-05-11 ENCOUNTER — Ambulatory Visit (HOSPITAL_COMMUNITY)
Admission: RE | Admit: 2013-05-11 | Discharge: 2013-05-11 | Disposition: A | Discharge status: Home / Self Care | Payer: Managed Care, Other (non HMO) | Source: Ambulatory Visit | Attending: Internal Medicine | Admitting: Internal Medicine

## 2013-05-11 VITALS — BP 93/60 | HR 60 | Wt 267.4 lb

## 2013-05-11 DIAGNOSIS — I509 Heart failure, unspecified: Secondary | ICD-10-CM | POA: Insufficient documentation

## 2013-05-11 DIAGNOSIS — I5022 Chronic systolic (congestive) heart failure: Secondary | ICD-10-CM | POA: Insufficient documentation

## 2013-05-11 DIAGNOSIS — I428 Other cardiomyopathies: Secondary | ICD-10-CM | POA: Insufficient documentation

## 2013-05-11 DIAGNOSIS — I2510 Atherosclerotic heart disease of native coronary artery without angina pectoris: Secondary | ICD-10-CM | POA: Insufficient documentation

## 2013-05-11 DIAGNOSIS — E785 Hyperlipidemia, unspecified: Secondary | ICD-10-CM | POA: Insufficient documentation

## 2013-05-11 DIAGNOSIS — Z951 Presence of aortocoronary bypass graft: Secondary | ICD-10-CM | POA: Insufficient documentation

## 2013-05-11 DIAGNOSIS — E119 Type 2 diabetes mellitus without complications: Secondary | ICD-10-CM | POA: Insufficient documentation

## 2013-05-11 DIAGNOSIS — Z79899 Other long term (current) drug therapy: Secondary | ICD-10-CM | POA: Insufficient documentation

## 2013-05-11 DIAGNOSIS — I252 Old myocardial infarction: Secondary | ICD-10-CM | POA: Insufficient documentation

## 2013-05-11 DIAGNOSIS — I059 Rheumatic mitral valve disease, unspecified: Secondary | ICD-10-CM | POA: Insufficient documentation

## 2013-05-11 DIAGNOSIS — Z7982 Long term (current) use of aspirin: Secondary | ICD-10-CM | POA: Insufficient documentation

## 2013-05-11 NOTE — Progress Notes (Signed)
Patient ID: Christine Knight, female   DOB: January 23, 1951, 63 y.o.   MRN: [id number] PCP: None  HPI: Christine Knight is a 62 y.o. female w/ PMHx significant for morbid obesity, systolic HF due to ICM (10/12 EF 25%; 07/2012 EF 35%; 03/18/13 EF 20-25%), CAD with h/o NSTEMI s/p CABG Oct 2012, pulmonary hypertension (PA peak ), and poorly controlled DM 2.  She was admitted to [facility] for dyspnea, leg edema, and weight gain.  Lasix gtt used and she diuresed 15 liters. Diureses 29 pounds.  ECHO- EF 20-25%.  Discharge weight 264 pounds.   03/18/13 ECHO EF 20-25%  She returns for follow up today.  Last visit spironolactone stopped due to hyperkalemia and dizziness. Says she feels much better off spironolactone. Denies SOB/PND/Orthopnea/dizziness. Weight at home 263 pounds. She has cut back her fluid intake to < 2 liters per day. Following low salt diet. Compliant with medications. Working full time.        ROS: All systems negative except as listed in HPI, PMH and Problem List.  Past Medical History  Diagnosis Date  . Obesity, morbid   . Chronic systolic heart failure     Chronic systolic CHF  . Hyperlipidemia 08/10/11  . Diabetes mellitus type 2 in obese 08/10/11  . CAD (coronary artery disease) 08/2011    NSTEMI with subsequent CABG 08/13/2011 with LIMA-LAD, SVG-diagonal, SVG-OM, SVG-PDA  . Ischemic cardiomyopathy 08/13/2011    EF 20% in 08/2011, still 25% 10/21/2011  . Pleural effusion     Requiring L thoracentesis 09/02/11  . Mitral regurgitation     Mild by echo 10/21/11  . Hypertension   . Pulmonary hypertension     Echo, September, 2013, 72 mmHg.  Marland Kitchen Ejection fraction < 50%     EF 20%, October, 2012  //  EF 25% December, 2012  //   EF 35%, echo, September, 2013  . Myocardial infarction 08/2011  . Orthopnoea     "progressively worse over last 3 wks" (03/17/2013)  . CHF (congestive heart failure)   . Carpal tunnel syndrome on both sides     "post OHS in 08/2011; resolved now" (03/17/2013)     Current Outpatient Prescriptions  Medication Sig Dispense Refill  . ACCU-CHEK SMARTVIEW test strip       . aspirin 325 MG EC tablet Take 325 mg by mouth daily.        . carvedilol (COREG) 12.5 MG tablet Take 1 tablet (12.5 mg total) by mouth 2 (two) times daily.  180 tablet  3  . glipiZIDE (GLUCOTROL) 5 MG tablet Take 1 tablet (5 mg total) by mouth 2 (two) times daily before a meal.  180 tablet  3  . lisinopril (PRINIVIL,ZESTRIL) 5 MG tablet Take 1 tablet (5 mg total) by mouth 2 (two) times daily.  180 tablet  3  . torsemide (DEMADEX) 20 MG tablet Take 2 tablets (40 mg total) by mouth 2 (two) times daily.  360 tablet  1  . [DISCONTINUED] simvastatin (ZOCOR) 20 MG tablet Take 1 tablet (20 mg total) by mouth every evening.  90 tablet  1   No current facility-administered medications for this encounter.     PHYSICAL EXAM: Filed Vitals:   05/11/13 1452  BP: 93/60  Pulse: 60  Weight: 267 lb 6.4 oz (121.292 kg)  SpO2: 99%    General:  Well appearing. No resp difficulty HEENT: normal Neck: supple. JVP hard to assess but does not appear elevated. Carotids 2+ bilaterally; no bruits. No lymphadenopathy or thryomegaly  appreciated. Cor: PMI normal. Regular rate & rhythm. No rubs, gallops or murmurs. Lungs: clear Abdomen: soft, nontender, nondistended. No hepatosplenomegaly. No bruits or masses. Good bowel sounds. Extremities: no cyanosis, clubbing, rash, tr lower extremity edema Neuro: alert & orientedx3, cranial nerves grossly intact. Moves all 4 extremities w/o difficulty. Affect pleasant.     ASSESSMENT & PLAN:

## 2013-05-11 NOTE — Patient Instructions (Addendum)
Follow up in 4-5 weeks with an ECHO  Do the following things EVERYDAY: 1) Weigh yourself in the morning before breakfast. Write it down and keep it in a log. 2) Take your medicines as prescribed 3) Eat low salt foods-Limit salt (sodium) to 2000 mg per day.  4) Stay as active as you can everyday 5) Limit all fluids for the day to less than 2 liters

## 2013-05-11 NOTE — Assessment & Plan Note (Signed)
NYHA II. Volume status stable. Continue current diuretic regimen. Unable to titrate beta blocker due to soft BP and HR 60. Continue lisinopril at current dose will not titrate up due to elevated potassium. She will remain off spironolactone due to hyperkalemia and dizziness. Follow up in 1 month with repeat ECHO. If EF remains will need referral for ICD to prevent SCD.

## 2013-06-17 ENCOUNTER — Ambulatory Visit (HOSPITAL_BASED_OUTPATIENT_CLINIC_OR_DEPARTMENT_OTHER)
Admission: RE | Admit: 2013-06-17 | Discharge: 2013-06-17 | Disposition: A | Discharge status: Home / Self Care | Payer: Managed Care, Other (non HMO) | Source: Ambulatory Visit | Attending: Internal Medicine | Admitting: Internal Medicine

## 2013-06-17 ENCOUNTER — Ambulatory Visit (HOSPITAL_COMMUNITY)
Admission: RE | Admit: 2013-06-17 | Discharge: 2013-06-17 | Disposition: A | Discharge status: Home / Self Care | Payer: Managed Care, Other (non HMO) | Source: Ambulatory Visit | Attending: Internal Medicine | Admitting: Internal Medicine

## 2013-06-17 ENCOUNTER — Telehealth (HOSPITAL_COMMUNITY): Payer: Self-pay | Admitting: Anesthesiology

## 2013-06-17 VITALS — BP 118/78 | HR 70 | Wt 268.5 lb

## 2013-06-17 DIAGNOSIS — I5022 Chronic systolic (congestive) heart failure: Secondary | ICD-10-CM

## 2013-06-17 DIAGNOSIS — I2510 Atherosclerotic heart disease of native coronary artery without angina pectoris: Secondary | ICD-10-CM

## 2013-06-17 DIAGNOSIS — I379 Nonrheumatic pulmonary valve disorder, unspecified: Secondary | ICD-10-CM | POA: Insufficient documentation

## 2013-06-17 DIAGNOSIS — I059 Rheumatic mitral valve disease, unspecified: Secondary | ICD-10-CM | POA: Insufficient documentation

## 2013-06-17 DIAGNOSIS — I252 Old myocardial infarction: Secondary | ICD-10-CM | POA: Insufficient documentation

## 2013-06-17 DIAGNOSIS — I079 Rheumatic tricuspid valve disease, unspecified: Secondary | ICD-10-CM | POA: Insufficient documentation

## 2013-06-17 DIAGNOSIS — E119 Type 2 diabetes mellitus without complications: Secondary | ICD-10-CM | POA: Insufficient documentation

## 2013-06-17 DIAGNOSIS — R0602 Shortness of breath: Secondary | ICD-10-CM | POA: Insufficient documentation

## 2013-06-17 DIAGNOSIS — E875 Hyperkalemia: Secondary | ICD-10-CM | POA: Insufficient documentation

## 2013-06-17 DIAGNOSIS — I509 Heart failure, unspecified: Secondary | ICD-10-CM | POA: Insufficient documentation

## 2013-06-17 DIAGNOSIS — I428 Other cardiomyopathies: Secondary | ICD-10-CM | POA: Insufficient documentation

## 2013-06-17 DIAGNOSIS — E785 Hyperlipidemia, unspecified: Secondary | ICD-10-CM | POA: Insufficient documentation

## 2013-06-17 DIAGNOSIS — I10 Essential (primary) hypertension: Secondary | ICD-10-CM | POA: Insufficient documentation

## 2013-06-17 LAB — BASIC METABOLIC PANEL
BUN: 43 mg/dL — ABNORMAL HIGH (ref 6–23)
CO2: 28 mEq/L (ref 19–32)
Calcium: 10 mg/dL (ref 8.4–10.5)
Creatinine, Ser: 0.99 mg/dL (ref 0.50–1.10)
Glucose, Bld: 95 mg/dL (ref 70–99)
Sodium: 138 mEq/L (ref 135–145)

## 2013-06-17 MED ORDER — GLIPIZIDE 10 MG PO TABS: 10.0000 mg | ORAL_TABLET | Freq: Two times a day (BID) | ORAL | Status: DC

## 2013-06-17 NOTE — Progress Notes (Signed)
Patient ID: Christine Knight, female   DOB: [birth year], 62 y.o.   MRN: [id number] PCP: None  HPI: Christine Knight is a 62 y.o. female w/ PMHx significant for morbid obesity, systolic HF due to ICM (10/12 EF 25%; 07/2012 EF 35%; 03/18/13 EF 20-25%), CAD with h/o NSTEMI s/p CABG Oct 2012, pulmonary hypertension (PA peak ), and poorly controlled DM 2.  She was admitted to [facility] for dyspnea, leg edema, and weight gain.  Lasix gtt used and she diuresed 15 liters. Diuresed 29 pounds.  ECHO- EF 20-25%.  Discharge weight 264 pounds. Cleda Daub stopped d/t dizziness and hyperkalemia   03/18/13 ECHO EF 20-25%  06/17/2013 ECHO EF 35% RV ok (read formally after visit 35-40%)  Follow up: Reports feeling good. Denies SOB, orthopnea and CP. No edema Does not try to walk up steps. Goal is to lose some weight. Taking medications as prescribed and weighing daily. Weight at home 260-263.   ROS: All systems negative except as listed in HPI, PMH and Problem List.  Past Medical History  Diagnosis Date  . Obesity, morbid   . Chronic systolic heart failure     Chronic systolic CHF  . Hyperlipidemia 08/10/11  . Diabetes mellitus type 2 in obese 08/10/11  . CAD (coronary artery disease) 08/2011    NSTEMI with subsequent CABG 08/13/2011 with LIMA-LAD, SVG-diagonal, SVG-OM, SVG-PDA  . Ischemic cardiomyopathy 08/13/2011    EF 20% in 08/2011, still 25% 10/21/2011  . Pleural effusion     Requiring L thoracentesis 09/02/11  . Mitral regurgitation     Mild by echo 10/21/11  . Hypertension   . Pulmonary hypertension     Echo, September, 2013, 72 mmHg.  Marland Kitchen Ejection fraction < 50%     EF 20%, October, 2012  //  EF 25% December, 2012  //   EF 35%, echo, September, 2013  . Myocardial infarction 08/2011  . Orthopnoea     "progressively worse over last 3 wks" (03/17/2013)  . CHF (congestive heart failure)   . Carpal tunnel syndrome on both sides     "post OHS in 08/2011; resolved now" (03/17/2013)    Current Outpatient Prescriptions   Medication Sig Dispense Refill  . ACCU-CHEK SMARTVIEW test strip       . aspirin 325 MG EC tablet Take 325 mg by mouth daily.        . carvedilol (COREG) 12.5 MG tablet Take 1 tablet (12.5 mg total) by mouth 2 (two) times daily.  180 tablet  3  . glipiZIDE (GLUCOTROL) 5 MG tablet Take 1 tablet (5 mg total) by mouth 2 (two) times daily before a meal.  180 tablet  3  . lisinopril (PRINIVIL,ZESTRIL) 5 MG tablet Take 1 tablet (5 mg total) by mouth 2 (two) times daily.  180 tablet  3  . torsemide (DEMADEX) 20 MG tablet Take 2 tablets (40 mg total) by mouth 2 (two) times daily.  360 tablet  1  . [DISCONTINUED] simvastatin (ZOCOR) 20 MG tablet Take 1 tablet (20 mg total) by mouth every evening.  90 tablet  1   No current facility-administered medications for this encounter.     PHYSICAL EXAM: Filed Vitals:   06/17/13 1601  BP: 118/78  Pulse: 70  Weight: 268 lb 8 oz (121.791 kg)  SpO2: 95%    General:  Well appearing. No resp difficulty HEENT: normal Neck: supple. JVP hard to assess but does not appear elevated. Carotids 2+ bilaterally; no bruits. No lymphadenopathy or thryomegaly appreciated. Cor: PMI normal.  Regular rate & rhythm. No rubs, gallops or murmurs. Lungs: clear Abdomen: soft, nontender, nondistended. No hepatosplenomegaly. No bruits or masses. Good bowel sounds. Extremities: no cyanosis, clubbing, rash, tr lower extremity edema Neuro: alert & orientedx3, cranial nerves grossly intact. Moves all 4 extremities w/o difficulty. Affect pleasant.  ASSESSMENT  1. Chronic systolic HF due to iCM EF 35% 2. CAD s/p NSTEMI and CABG 10/12 3. Morbid obesity 4. Pulmonary HTN 5. DM2 6. Hyperkalemia - spiro stopped  PLAN/DISCUSSION:  Attending: Patient seen and examined with Ulla Potash, NP. We discussed all aspects of the encounter. I agree with the assessment and plan as stated above.   Continues to improve from functional standpoint now NYHA I-II. Volume status looks good. I  reviewed echo today and EF ~35%. We discussed possibility of ICD but she feels strongly that her heart will continue to recover and wants to wait to make decision. Is aware of risk of SCD. Will increase lisinopril to 10 bid. Will also decrease ASA to 81 mg daily. Will check BMET today and in 1 week. (Wait to see K+ before increasing lisinopril).   Daniel Bensimhon,MD 4:18 PM

## 2013-06-17 NOTE — Progress Notes (Signed)
Echo Lab  2D Echocardiogram completed.  Kenan Moodie L Alyzabeth Pontillo, RDCS 06/17/2013 3:47 PM

## 2013-06-17 NOTE — Patient Instructions (Addendum)
Will call with lab results, if K+ good will increase lisinopril to 10 mg twice a day.  Follow up in 2 months.

## 2013-06-17 NOTE — Telephone Encounter (Signed)
Patient's K+ stable 3.9 and Cr. 99 will increase lisinopril to 10 mg BID. Recheck BMET next week at [facility]. Patient states she understands.

## 2013-09-07 ENCOUNTER — Encounter (HOSPITAL_COMMUNITY): Payer: Self-pay | Admitting: Cardiology

## 2013-09-07 ENCOUNTER — Telehealth (HOSPITAL_COMMUNITY): Payer: Self-pay | Admitting: Cardiology

## 2013-09-07 NOTE — Telephone Encounter (Signed)
Attempting to schedule 2 month follow up I have been unable to reach this patient by phone.  A letter is being sent to the last known home address.  

## 2013-09-16 ENCOUNTER — Other Ambulatory Visit: Payer: Self-pay

## 2013-10-05 ENCOUNTER — Other Ambulatory Visit (HOSPITAL_COMMUNITY): Payer: Self-pay | Admitting: *Deleted

## 2013-10-05 MED ORDER — TORSEMIDE 20 MG PO TABS: 40.0000 mg | ORAL_TABLET | Freq: Two times a day (BID) | ORAL | Status: DC

## 2013-11-14 ENCOUNTER — Encounter: Payer: Self-pay | Admitting: Cardiology

## 2013-11-14 DIAGNOSIS — T500X5A Adverse effect of mineralocorticoids and their antagonists, initial encounter: Secondary | ICD-10-CM | POA: Insufficient documentation

## 2013-11-15 ENCOUNTER — Ambulatory Visit: Payer: Managed Care, Other (non HMO) | Admitting: Cardiology

## 2013-11-15 ENCOUNTER — Encounter: Payer: Self-pay | Admitting: Cardiology

## 2013-11-15 ENCOUNTER — Ambulatory Visit (INDEPENDENT_AMBULATORY_CARE_PROVIDER_SITE_OTHER): Payer: Managed Care, Other (non HMO) | Admitting: Cardiology

## 2013-11-15 VITALS — BP 137/82 | HR 80 | Ht 67.0 in | Wt 256.8 lb

## 2013-11-15 DIAGNOSIS — I5022 Chronic systolic (congestive) heart failure: Secondary | ICD-10-CM

## 2013-11-15 DIAGNOSIS — I255 Ischemic cardiomyopathy: Secondary | ICD-10-CM

## 2013-11-15 DIAGNOSIS — E119 Type 2 diabetes mellitus without complications: Secondary | ICD-10-CM

## 2013-11-15 DIAGNOSIS — T500X5D Adverse effect of mineralocorticoids and their antagonists, subsequent encounter: Secondary | ICD-10-CM

## 2013-11-15 DIAGNOSIS — Z5189 Encounter for other specified aftercare: Secondary | ICD-10-CM

## 2013-11-15 DIAGNOSIS — I2589 Other forms of chronic ischemic heart disease: Secondary | ICD-10-CM

## 2013-11-15 DIAGNOSIS — E1169 Type 2 diabetes mellitus with other specified complication: Secondary | ICD-10-CM

## 2013-11-15 DIAGNOSIS — I2510 Atherosclerotic heart disease of native coronary artery without angina pectoris: Secondary | ICD-10-CM

## 2013-11-15 DIAGNOSIS — E669 Obesity, unspecified: Secondary | ICD-10-CM

## 2013-11-15 MED ORDER — GLIPIZIDE 10 MG PO TABS: 10.0000 mg | ORAL_TABLET | Freq: Two times a day (BID) | ORAL | Status: DC

## 2013-11-15 MED ORDER — LISINOPRIL 10 MG PO TABS: 10.0000 mg | ORAL_TABLET | Freq: Every day | ORAL | Status: DC

## 2013-11-15 MED ORDER — ASPIRIN EC 81 MG PO TBEC: 81.0000 mg | DELAYED_RELEASE_TABLET | Freq: Every day | ORAL | Status: DC

## 2013-11-15 MED ORDER — CARVEDILOL 25 MG PO TABS: 25.0000 mg | ORAL_TABLET | Freq: Two times a day (BID) | ORAL | Status: DC

## 2013-11-15 NOTE — Assessment & Plan Note (Signed)
The patient's ejection fraction was as low as 20-25% in May, 2014. The left ventricular EF was up to 35% in August, 2014. Also her right ventricular function then seemed to be okay. She is feeling much better. I will continue to titrate her medications. She cannot take spear lactone because of dizziness and hyperkalemia. The patient has not wanted to consider an ICD up to this point. I will continue to adjust her meds and and see her back for followup.

## 2013-11-15 NOTE — Assessment & Plan Note (Signed)
Patient underwent bypass surgery in 2012. She does not need any further workup of her coronary disease at this time.

## 2013-11-15 NOTE — Progress Notes (Signed)
HPI  Patient is seen today to followup cardiomyopathy. I saw her last in the office in October, 2013. After that time she was actually admitted to the hospital with marked CHF in May, 2014. Her EF was 20-35%. She had marked diuresis. She was then seen in followup in the heart failure clinic on several occasions. Her ejection fraction increased to 35% by echo in August, 2014. In the heart failure clinic she was feeling better and now she continues to feel better. She says she is very careful with her medications and limiting her salt and fluid intake. She says she can climb stairs and she feels much better.  As part of today's evaluation I have reviewed the extensive hospital records. I have updated this record. I've reviewed the visits in the heart failure clinic. No Known Allergies  Current Outpatient Prescriptions  Medication Sig Dispense Refill  . ACCU-CHEK SMARTVIEW test strip       . aspirin 325 MG EC tablet Take 325 mg by mouth daily.        . carvedilol (COREG) 12.5 MG tablet Take 1 tablet (12.5 mg total) by mouth 2 (two) times daily.  180 tablet  3  . glipiZIDE (GLUCOTROL) 10 MG tablet Take 1 tablet (10 mg total) by mouth 2 (two) times daily before a meal.  180 tablet  3  . lisinopril (PRINIVIL,ZESTRIL) 5 MG tablet Take 1 tablet (5 mg total) by mouth 2 (two) times daily.  180 tablet  3  . torsemide (DEMADEX) 20 MG tablet Take 2 tablets (40 mg total) by mouth 2 (two) times daily.  360 tablet  3  . [DISCONTINUED] simvastatin (ZOCOR) 20 MG tablet Take 1 tablet (20 mg total) by mouth every evening.  90 tablet  1   No current facility-administered medications for this visit.    History   Social History  . Marital Status: Divorced    Spouse Name: N/A    Number of Children: N/A  . Years of Education: N/A   Occupational History  . Not on file.   Social History Main Topics  . Smoking status: Former Smoker    Types: Cigarettes    Quit date: 11/11/1978  . Smokeless tobacco: Never  Used     Comment: 03/17/2013 "only a social smoker when I did smoke; ever bought any"  . Alcohol Use: Yes     Comment: 03/17/2013 "in the last 6 months I've had 1 glass of wine"  . Drug Use: No  . Sexual Activity: Not Currently    Birth Control/ Protection: Post-menopausal   Other Topics Concern  . Not on file   Social History Narrative  . No narrative on file    Family History  Problem Relation Age of Onset  . Heart disease Father   . Cancer Neg Hx   . Diabetes Neg Hx   . Hypertension Neg Hx   . Kidney disease Neg Hx     Past Medical History  Diagnosis Date  . Obesity, morbid   . Chronic systolic heart failure     Chronic systolic CHF  . Hyperlipidemia 08/10/11  . Diabetes mellitus type 2 in obese 08/10/11  . CAD (coronary artery disease) 08/2011    NSTEMI with subsequent CABG 08/13/2011 with LIMA-LAD, SVG-diagonal, SVG-OM, SVG-PDA  . Ischemic cardiomyopathy 08/13/2011    EF 20% in 08/2011, still 25% 10/21/2011  . Pleural effusion     Requiring L thoracentesis 09/02/11  . Mitral regurgitation     Mild  by echo 10/21/11  . Hypertension   . Pulmonary hypertension     Echo, September, 2013, 72 mmHg.  Marland Kitchen Ejection fraction < 50%     EF 20%, October, 2012  //  EF 25% December, 2012  //   EF 35%, echo, September, 2013  . Myocardial infarction 08/2011  . Orthopnoea     "progressively worse over last 3 wks" (03/17/2013)  . CHF (congestive heart failure)   . Carpal tunnel syndrome on both sides     "post OHS in 08/2011; resolved now" (03/17/2013)    Past Surgical History  Procedure Laterality Date  . Laparoscopy  1980's?    "removed gallstones; not gallbladder" (03/17/2013)  . Coronary artery bypass graft  08/13/11    CABG x4 with LIMA to LAD, SVG to Diag, SVG to OM, SVG to PDA, EVH via both thighs    Patient Active Problem List   Diagnosis Date Noted  . Spironolactone adverse reaction 11/14/2013  . Hyperkalemia 06/17/2013  . Chronic systolic heart failure 04/02/2013  .  Abdominal pain 03/22/2013  . Hypothyroidism 03/18/2013  . Pulmonary hypertension   . Mitral regurgitation   . Anemia 10/25/2011  . CAD (coronary artery disease) 08/30/2011  . Obesity, morbid   . Diabetes mellitus type 2 in obese   . Ischemic cardiomyopathy 08/13/2011  . Hyperlipidemia 08/10/2011    ROS   Patient denies fever, chills, headache, sweats, rash, change in vision, change in hearing, chest pain, cough, nausea vomiting, urinary symptoms. All other systems are reviewed and are negative.  PHYSICAL EXAM  Patient is oriented to person time and place. Affect is normal. There is no jugulovenous distention. Lungs are clear. Respiratory effort is nonlabored. Cardiac exam reveals S1 and S2. The abdomen is soft. There is no peripheral edema. There no musculoskeletal deformities. There are no skin rashes.  Filed Vitals:   11/15/13 1424  BP: 137/82  Pulse: 80  Height: 5\' 7"  (1.702 m)  Weight: 256 lb 12.8 oz (116.484 kg)     ASSESSMENT & PLAN

## 2013-11-15 NOTE — Assessment & Plan Note (Addendum)
The patient's chronic systolic heart failure is under control today. Continue the same medications. Chemistry lab will be checked today.  As part of today's evaluation I spent greater than 25 minutes with a total care. More than half of this time was with direct contact with her talking about her medications and her overall care.

## 2013-11-15 NOTE — Assessment & Plan Note (Signed)
Patient ran out of her glipizide. We will write his prescription for her as I want her to have this before she will be able to see a primary physician

## 2013-11-15 NOTE — Patient Instructions (Signed)
**Note De-Identified Christine Knight Obfuscation** Your physician has recommended you make the following change in your medication: increase Carvedilol to 25 mg twice daily and Lisinopril to 10 mg once daily  Your physician recommends that you return for lab work in: today  Your physician recommends that you schedule a follow-up appointment in: 6 weeks

## 2013-11-16 ENCOUNTER — Telehealth: Payer: Self-pay | Admitting: *Deleted

## 2013-11-16 LAB — BASIC METABOLIC PANEL
BUN: 22 mg/dL (ref 6–23)
CALCIUM: 9.2 mg/dL (ref 8.4–10.5)
CHLORIDE: 88 meq/L — AB (ref 96–112)
CO2: 29 meq/L (ref 19–32)
Creatinine, Ser: 1.2 mg/dL (ref 0.4–1.2)
GFR: 57.93 mL/min — ABNORMAL LOW (ref >60.00)
Glucose, Bld: 756 mg/dL (ref 70–99)
Potassium: 3.9 mEq/L (ref 3.5–5.1)
SODIUM: 127 meq/L — AB (ref 135–145)

## 2013-11-16 NOTE — Telephone Encounter (Signed)
[facility] called critical glucose from yesterday/ glucose 756. I called pt and left a msg to call back regarding lab results.

## 2013-11-16 NOTE — Telephone Encounter (Signed)
Left another msg to call for lab results.

## 2013-11-17 NOTE — Telephone Encounter (Signed)
LMTCB

## 2013-11-22 NOTE — Telephone Encounter (Signed)
LMTCB

## 2013-11-23 NOTE — Telephone Encounter (Signed)
Phone call went straight to pts VM.  We have been trying to reach pt at phone number provided with out success since 11/16/13. Results letter will be mailed to pts address.

## 2013-12-16 ENCOUNTER — Emergency Department (HOSPITAL_COMMUNITY)
Admission: EM | Admit: 2013-12-16 | Discharge: 2013-12-17 | Disposition: A | Discharge status: Home / Self Care | Payer: Managed Care, Other (non HMO) | Attending: Emergency Medicine | Admitting: Emergency Medicine

## 2013-12-16 ENCOUNTER — Encounter (HOSPITAL_COMMUNITY): Payer: Self-pay | Admitting: Emergency Medicine

## 2013-12-16 DIAGNOSIS — R739 Hyperglycemia, unspecified: Secondary | ICD-10-CM

## 2013-12-16 DIAGNOSIS — Z87891 Personal history of nicotine dependence: Secondary | ICD-10-CM | POA: Insufficient documentation

## 2013-12-16 DIAGNOSIS — Z8709 Personal history of other diseases of the respiratory system: Secondary | ICD-10-CM | POA: Insufficient documentation

## 2013-12-16 DIAGNOSIS — I2510 Atherosclerotic heart disease of native coronary artery without angina pectoris: Secondary | ICD-10-CM | POA: Insufficient documentation

## 2013-12-16 DIAGNOSIS — I10 Essential (primary) hypertension: Secondary | ICD-10-CM | POA: Insufficient documentation

## 2013-12-16 DIAGNOSIS — H9319 Tinnitus, unspecified ear: Secondary | ICD-10-CM

## 2013-12-16 DIAGNOSIS — I059 Rheumatic mitral valve disease, unspecified: Secondary | ICD-10-CM | POA: Insufficient documentation

## 2013-12-16 DIAGNOSIS — Z951 Presence of aortocoronary bypass graft: Secondary | ICD-10-CM | POA: Insufficient documentation

## 2013-12-16 DIAGNOSIS — I5022 Chronic systolic (congestive) heart failure: Secondary | ICD-10-CM | POA: Insufficient documentation

## 2013-12-16 DIAGNOSIS — R42 Dizziness and giddiness: Secondary | ICD-10-CM

## 2013-12-16 DIAGNOSIS — R110 Nausea: Secondary | ICD-10-CM | POA: Insufficient documentation

## 2013-12-16 DIAGNOSIS — E119 Type 2 diabetes mellitus without complications: Secondary | ICD-10-CM | POA: Insufficient documentation

## 2013-12-16 DIAGNOSIS — Z7982 Long term (current) use of aspirin: Secondary | ICD-10-CM | POA: Insufficient documentation

## 2013-12-16 DIAGNOSIS — I252 Old myocardial infarction: Secondary | ICD-10-CM | POA: Insufficient documentation

## 2013-12-16 DIAGNOSIS — Z79899 Other long term (current) drug therapy: Secondary | ICD-10-CM | POA: Insufficient documentation

## 2013-12-16 LAB — CBC
HCT: 40.6 % (ref 36.0–46.0)
HEMOGLOBIN: 14.4 g/dL (ref 12.0–15.0)
MCH: 31.7 pg (ref 26.0–34.0)
MCHC: 35.5 g/dL (ref 30.0–36.0)
MCV: 89.4 fL (ref 78.0–100.0)
Platelets: 263 10*3/uL (ref 150–400)
RBC: 4.54 MIL/uL (ref 3.87–5.11)
RDW: 12.9 % (ref 11.5–15.5)
WBC: 5.7 10*3/uL (ref 4.0–10.5)

## 2013-12-16 LAB — DIFFERENTIAL
Basophils Absolute: 0 10*3/uL (ref 0.0–0.1)
Basophils Relative: 0 % (ref 0–1)
EOS ABS: 0.1 10*3/uL (ref 0.0–0.7)
Eosinophils Relative: 3 % (ref 0–5)
LYMPHS ABS: 1.8 10*3/uL (ref 0.7–4.0)
LYMPHS PCT: 32 % (ref 12–46)
MONOS PCT: 8 % (ref 3–12)
Monocytes Absolute: 0.4 10*3/uL (ref 0.1–1.0)
NEUTROS PCT: 58 % (ref 43–77)
Neutro Abs: 3.3 10*3/uL (ref 1.7–7.7)

## 2013-12-16 LAB — COMPREHENSIVE METABOLIC PANEL
ALK PHOS: 139 U/L — AB (ref 39–117)
ALT: 15 U/L (ref 0–35)
AST: 21 U/L (ref 0–37)
Albumin: 3.5 g/dL (ref 3.5–5.2)
BILIRUBIN TOTAL: 0.4 mg/dL (ref 0.3–1.2)
BUN: 14 mg/dL (ref 6–23)
CO2: 25 meq/L (ref 19–32)
Calcium: 9.3 mg/dL (ref 8.4–10.5)
Chloride: 96 mEq/L (ref 96–112)
Creatinine, Ser: 0.72 mg/dL (ref 0.50–1.10)
GLUCOSE: 313 mg/dL — AB (ref 70–99)
POTASSIUM: 4.1 meq/L (ref 3.7–5.3)
SODIUM: 136 meq/L — AB (ref 137–147)
TOTAL PROTEIN: 8.3 g/dL (ref 6.0–8.3)

## 2013-12-16 LAB — GLUCOSE, CAPILLARY: GLUCOSE-CAPILLARY: 225 mg/dL — AB (ref 70–99)

## 2013-12-16 LAB — POCT I-STAT TROPONIN I: Troponin i, poc: 0.03 ng/mL (ref 0.00–0.08)

## 2013-12-16 LAB — TROPONIN I

## 2013-12-16 MED ORDER — LORAZEPAM 2 MG/ML IJ SOLN
1.0000 mg | Freq: Once | INTRAMUSCULAR | Status: AC
  Administered 2013-12-16: 1 mg via INTRAVENOUS
  Filled 2013-12-16: qty 1

## 2013-12-16 MED ORDER — SODIUM CHLORIDE 0.9 % IV BOLUS (SEPSIS)
1000.0000 mL | Freq: Once | INTRAVENOUS | Status: AC
  Administered 2013-12-16: 1000 mL via INTRAVENOUS

## 2013-12-16 MED ORDER — INSULIN ASPART 100 UNIT/ML ~~LOC~~ SOLN
10.0000 [IU] | Freq: Once | SUBCUTANEOUS | Status: AC
  Administered 2013-12-16: 10 [IU] via INTRAVENOUS
  Filled 2013-12-16: qty 1

## 2013-12-16 NOTE — ED Notes (Signed)
Performed pt CBG and the result was 225 and nurse was notified of result

## 2013-12-16 NOTE — ED Provider Notes (Signed)
CSN: [id number]     Arrival date & time 12/16/13  1824 History   First MD Initiated Contact with Patient 12/16/13 2158     Chief Complaint  Patient presents with  . Tinnitus   (Consider location/radiation/quality/duration/timing/severity/associated sxs/prior Treatment) HPI  This patient presents with concerns of tinnitus, nausea. Symptoms began approximately 4 days ago. Initially the problem was isolated, but in the interval she has developed nausea, mild disequilibrium, with no falls, loss of consciousness, confusion, disorientation, vomiting, diarrhea, fever, chills. Patient has no mouth pain, no hearing loss. No new medication.  However, the patient has been without her anti-hyperglycemics until recently. No clear alleviating or exacerbating factors.   Past Medical History  Diagnosis Date  . Obesity, morbid   . Chronic systolic heart failure     Chronic systolic CHF  . Hyperlipidemia 08/10/11  . Diabetes mellitus type 2 in obese 08/10/11  . CAD (coronary artery disease) 08/2011    NSTEMI with subsequent CABG 08/13/2011 with LIMA-LAD, SVG-diagonal, SVG-OM, SVG-PDA  . Ischemic cardiomyopathy 08/13/2011    EF 20% in 08/2011, still 25% 10/21/2011  . Pleural effusion     Requiring L thoracentesis 09/02/11  . Mitral regurgitation     Mild by echo 10/21/11  . Hypertension   . Pulmonary hypertension     Echo, September, 2013, 72 mmHg.  Marland Kitchen. Ejection fraction < 50%     EF 20%, October, 2012  //  EF 25% December, 2012  //   EF 35%, echo, September, 2013  . Myocardial infarction 08/2011  . Orthopnoea     "progressively worse over last 3 wks" (03/17/2013)  . CHF (congestive heart failure)   . Carpal tunnel syndrome on both sides     "post OHS in 08/2011; resolved now" (03/17/2013)   Past Surgical History  Procedure Laterality Date  . Laparoscopy  1980's?    "removed gallstones; not gallbladder" (03/17/2013)  . Coronary artery bypass graft  08/13/11    CABG x4 with LIMA to LAD, SVG to Diag,  SVG to OM, SVG to PDA, EVH via both thighs   Family History  Problem Relation Age of Onset  . Heart disease Father   . Cancer Neg Hx   . Diabetes Neg Hx   . Hypertension Neg Hx   . Kidney disease Neg Hx    History  Substance Use Topics  . Smoking status: Former Smoker    Types: Cigarettes    Quit date: 11/11/1978  . Smokeless tobacco: Never Used     Comment: 03/17/2013 "only a social smoker when I did smoke; ever bought any"  . Alcohol Use: Yes     Comment: 03/17/2013 "in the last 6 months I've had 1 glass of wine"   OB History   Grav Para Term Preterm Abortions TAB SAB Ect Mult Living                 Review of Systems  Constitutional:       Per HPI, otherwise negative  HENT:       Per HPI, otherwise negative  Respiratory:       Per HPI, otherwise negative  Cardiovascular:       Per HPI, otherwise negative  Gastrointestinal: Positive for nausea. Negative for vomiting.  Endocrine:       Negative aside from HPI  Genitourinary:       Neg aside from HPI   Musculoskeletal:       Per HPI, otherwise negative  Skin: Negative.  Neurological: Positive for dizziness. Negative for syncope.    Allergies  Review of patient's allergies indicates no known allergies.  Home Medications   Current Outpatient Rx  Name  Route  Sig  Dispense  Refill  . ACCU-CHEK SMARTVIEW test strip               . aspirin 81 MG tablet   Oral   Take 1 tablet (81 mg total) by mouth daily.   30 tablet   9   . carvedilol (COREG) 25 MG tablet   Oral   Take 1 tablet (25 mg total) by mouth 2 (two) times daily.   180 tablet   0     Increase in dose   . glipiZIDE (GLUCOTROL) 10 MG tablet   Oral   Take 1 tablet (10 mg total) by mouth 2 (two) times daily before a meal.   180 tablet   0   . lisinopril (PRINIVIL,ZESTRIL) 10 MG tablet   Oral   Take 1 tablet (10 mg total) by mouth daily.   90 tablet   0     Increase in dose   . torsemide (DEMADEX) 20 MG tablet   Oral   Take 2 tablets  (40 mg total) by mouth 2 (two) times daily.   360 tablet   3    BP 145/76  Pulse 63  Temp(Src) 98.8 F (37.1 C) (Oral)  Resp 16  Ht 5\' 7"  (1.702 m)  Wt 262 lb 12.8 oz (119.205 kg)  BMI 41.15 kg/m2  SpO2 100% Physical Exam  Nursing note and vitals reviewed. Constitutional: She is oriented to person, place, and time. She appears well-developed and well-nourished. No distress.  HENT:  Head: Normocephalic and atraumatic.  Left tympanic membrane with mild effusion, no injection, mild sclerotic changes  Eyes: Conjunctivae and EOM are normal.  Cardiovascular: Normal rate and regular rhythm.   Pulmonary/Chest: Effort normal and breath sounds normal. No stridor. No respiratory distress.  Abdominal: She exhibits no distension.  Musculoskeletal: She exhibits no edema.  Neurological: She is alert and oriented to person, place, and time. She displays no atrophy and no tremor. No cranial nerve deficit or sensory deficit. She exhibits normal muscle tone. She displays no seizure activity. Coordination normal.  Patient with nausea provoked with head rotation.   Skin: Skin is warm and dry.  Psychiatric: She has a normal mood and affect.    ED Course  Procedures (including critical care time) Labs Review Labs Reviewed  COMPREHENSIVE METABOLIC PANEL - Abnormal; Notable for the following:    Sodium 136 (*)    Glucose, Bld 313 (*)    Alkaline Phosphatase 139 (*)    All other components within normal limits  CBC  DIFFERENTIAL  TROPONIN I  POCT I-STAT TROPONIN I   Imaging Review No results found.  EKG Interpretation    Date/Time:  Thursday December 16 2013 19:00:19 EST Ventricular Rate:  63 PR Interval:  166 QRS Duration: 108 QT Interval:  440 QTC Calculation: 450 R Axis:   93 Text Interpretation:  Normal sinus rhythm Rightward axis Borderline ECG Sinus rhythm Rightward axis Non-specific intra-ventricular conduction delay No significant change since last tracing (slightly greater  amplitude) Abnormal ekg Confirmed by Gerhard Munch  MD ([id number]) on 12/16/2013 10:51:31 PM          Initial labs notable for hyperglycemia. Patient seems convinced that her symptoms are secondary to this. She has received fluids, will receive a small bolus of insulin as well.  12:21 AM Patient in no distress w no ongoing complaints. Glucose 225 (down from >300)    MDM   1. Tinnitus   2. Vertigo   3. Hyperglycemia    Patient presents with concerns of tinnitus, vertigo-like symptoms and concern for hyperglycemia.  Exam she is awake and alert, neurologically intact, in no distress.  Patient's presentation may be secondary to benign vertigo, though with hyperglycemia, it is likely multifactorial. Patient has improvement with fluids, insulin, antinausea medication. Absent distress, with no acute findings, she was discharged in stable condition to follow up with primary care for further evaluation and management.    Gerhard Munch, MD 12/17/13 Rich Fuchs

## 2013-12-16 NOTE — ED Notes (Addendum)
Reports "ringing in her left ear" that started on tues. Now feeling off balance and nauseated. Grips are equal, no arm drift, no facial droop, speech clear at triage.

## 2013-12-16 NOTE — ED Notes (Signed)
Patient presents today with a chief complaint of tinnitus to bilateral ears with dizziness since Tuesday. Patient denies trauma, pain, syncope. Patient states "I just feel drunk when I walk." Patient alert and orientedx4, resting comfortably, family at bedside.

## 2013-12-17 MED ORDER — MECLIZINE HCL 25 MG PO TABS: 25.0000 mg | ORAL_TABLET | Freq: Three times a day (TID) | ORAL | Status: DC

## 2013-12-17 MED ORDER — LORAZEPAM 1 MG PO TABS: 1.0000 mg | ORAL_TABLET | Freq: Three times a day (TID) | ORAL | Status: DC | PRN

## 2013-12-17 NOTE — Discharge Instructions (Signed)
As discussed, it is important that you follow up as soon as possible with your physician for continued management of your condition. ° °If you develop any new, or concerning changes in your condition, please return to the emergency department immediately. ° °Dizziness °Dizziness is a common problem. It is a feeling of unsteadiness or lightheadedness. You may feel like you are about to faint. Dizziness can lead to injury if you stumble or fall. A person of any age group can suffer from dizziness, but dizziness is more common in older adults. °CAUSES  °Dizziness can be caused by many different things, including: °· Middle ear problems. °· Standing for too long. °· Infections. °· An allergic reaction. °· Aging. °· An emotional response to something, such as the sight of blood. °· Side effects of medicines. °· Fatigue. °· Problems with circulation or blood pressure. °· Excess use of alcohol, medicines, or illegal drug use. °· Breathing too fast (hyperventilation). °· An arrhythmia or problems with your heart rhythm. °· Low red blood cell count (anemia). °· Pregnancy. °· Vomiting, diarrhea, fever, or other illnesses that cause dehydration. °· Diseases or conditions such as Parkinson's disease, high blood pressure (hypertension), diabetes, and thyroid problems. °· Exposure to extreme heat. °DIAGNOSIS  °To find the cause of your dizziness, your caregiver may do a physical exam, lab tests, radiologic imaging scans, or an electrocardiography test (ECG).  °TREATMENT  °Treatment of dizziness depends on the cause of your symptoms and can vary greatly. °HOME CARE INSTRUCTIONS  °· Drink enough fluids to keep your urine clear or pale yellow. This is especially important in very hot weather. In the elderly, it is also important in cold weather. °· If your dizziness is caused by medicines, take them exactly as directed. When taking blood pressure medicines, it is especially important to get up slowly. °· Rise slowly from chairs and  steady yourself until you feel okay. °· In the morning, first sit up on the side of the bed. When this seems okay, stand slowly while holding onto something until you know your balance is fine. °· If you need to stand in one place for a long time, be sure to move your legs often. Tighten and relax the muscles in your legs while standing. °· If dizziness continues to be a problem, have someone stay with you for a day or two. Do this until you feel you are well enough to stay alone. Have the person call your caregiver if he or she notices changes in you that are concerning. °· Do not drive or use heavy machinery if you feel dizzy. °· Do not drink alcohol. °SEEK IMMEDIATE MEDICAL CARE IF:  °· Your dizziness or lightheadedness gets worse. °· You feel nauseous or vomit. °· You develop problems with talking, walking, weakness, or using your arms, hands, or legs. °· You are not thinking clearly or you have difficulty forming sentences. It may take a friend or family member to determine if your thinking is normal. °· You develop chest pain, abdominal pain, shortness of breath, or sweating. °· Your vision changes. °· You notice any bleeding. °· You have side effects from medicine that seems to be getting worse rather than better. °MAKE SURE YOU:  °· Understand these instructions. °· Will watch your condition. °· Will get help right away if you are not doing well or get worse. °Document Released: 04/23/2001 Document Revised: 01/20/2012 Document Reviewed: 05/17/2011 °[employer]® Patient Information ©2014 [employer]. ° °

## 2013-12-19 ENCOUNTER — Emergency Department (HOSPITAL_COMMUNITY)
Admission: EM | Admit: 2013-12-19 | Discharge: 2013-12-19 | Disposition: A | Discharge status: Home / Self Care | Payer: Managed Care, Other (non HMO) | Attending: Emergency Medicine | Admitting: Emergency Medicine

## 2013-12-19 ENCOUNTER — Encounter (HOSPITAL_COMMUNITY): Payer: Self-pay | Admitting: Emergency Medicine

## 2013-12-19 ENCOUNTER — Emergency Department (HOSPITAL_COMMUNITY): Payer: Managed Care, Other (non HMO)

## 2013-12-19 DIAGNOSIS — H7409 Tympanosclerosis, unspecified ear: Secondary | ICD-10-CM | POA: Insufficient documentation

## 2013-12-19 DIAGNOSIS — Z79899 Other long term (current) drug therapy: Secondary | ICD-10-CM | POA: Insufficient documentation

## 2013-12-19 DIAGNOSIS — H9209 Otalgia, unspecified ear: Secondary | ICD-10-CM | POA: Insufficient documentation

## 2013-12-19 DIAGNOSIS — I2510 Atherosclerotic heart disease of native coronary artery without angina pectoris: Secondary | ICD-10-CM | POA: Insufficient documentation

## 2013-12-19 DIAGNOSIS — Z8709 Personal history of other diseases of the respiratory system: Secondary | ICD-10-CM | POA: Insufficient documentation

## 2013-12-19 DIAGNOSIS — I5022 Chronic systolic (congestive) heart failure: Secondary | ICD-10-CM | POA: Insufficient documentation

## 2013-12-19 DIAGNOSIS — H9319 Tinnitus, unspecified ear: Secondary | ICD-10-CM | POA: Insufficient documentation

## 2013-12-19 DIAGNOSIS — I509 Heart failure, unspecified: Secondary | ICD-10-CM | POA: Insufficient documentation

## 2013-12-19 DIAGNOSIS — N390 Urinary tract infection, site not specified: Secondary | ICD-10-CM | POA: Insufficient documentation

## 2013-12-19 DIAGNOSIS — I252 Old myocardial infarction: Secondary | ICD-10-CM | POA: Insufficient documentation

## 2013-12-19 DIAGNOSIS — E119 Type 2 diabetes mellitus without complications: Secondary | ICD-10-CM | POA: Insufficient documentation

## 2013-12-19 DIAGNOSIS — Z7982 Long term (current) use of aspirin: Secondary | ICD-10-CM | POA: Insufficient documentation

## 2013-12-19 DIAGNOSIS — Z951 Presence of aortocoronary bypass graft: Secondary | ICD-10-CM | POA: Insufficient documentation

## 2013-12-19 DIAGNOSIS — I10 Essential (primary) hypertension: Secondary | ICD-10-CM | POA: Insufficient documentation

## 2013-12-19 DIAGNOSIS — R42 Dizziness and giddiness: Secondary | ICD-10-CM

## 2013-12-19 DIAGNOSIS — H9312 Tinnitus, left ear: Secondary | ICD-10-CM

## 2013-12-19 DIAGNOSIS — Z87891 Personal history of nicotine dependence: Secondary | ICD-10-CM | POA: Insufficient documentation

## 2013-12-19 LAB — BASIC METABOLIC PANEL
BUN: 22 mg/dL (ref 6–23)
CO2: 26 mEq/L (ref 19–32)
CREATININE: 0.83 mg/dL (ref 0.50–1.10)
Calcium: 9.6 mg/dL (ref 8.4–10.5)
Chloride: 94 mEq/L — ABNORMAL LOW (ref 96–112)
GFR, EST AFRICAN AMERICAN: 86 mL/min — AB (ref >90)
GFR, EST NON AFRICAN AMERICAN: 74 mL/min — AB (ref >90)
GLUCOSE: 372 mg/dL — AB (ref 70–99)
POTASSIUM: 4.3 meq/L (ref 3.7–5.3)
Sodium: 135 mEq/L — ABNORMAL LOW (ref 137–147)

## 2013-12-19 LAB — URINALYSIS, ROUTINE W REFLEX MICROSCOPIC
BILIRUBIN URINE: NEGATIVE
Glucose, UA: 500 mg/dL — AB
KETONES UR: NEGATIVE mg/dL
Nitrite: NEGATIVE
PH: 5 (ref 5.0–8.0)
Protein, ur: NEGATIVE mg/dL
SPECIFIC GRAVITY, URINE: 1.012 (ref 1.005–1.030)
UROBILINOGEN UA: 0.2 mg/dL (ref 0.0–1.0)

## 2013-12-19 LAB — CBC WITH DIFFERENTIAL/PLATELET
BASOS PCT: 0 % (ref 0–1)
Basophils Absolute: 0 10*3/uL (ref 0.0–0.1)
EOS ABS: 0.1 10*3/uL (ref 0.0–0.7)
EOS PCT: 3 % (ref 0–5)
HEMATOCRIT: 44 % (ref 36.0–46.0)
HEMOGLOBIN: 15.4 g/dL — AB (ref 12.0–15.0)
LYMPHS ABS: 1.4 10*3/uL (ref 0.7–4.0)
Lymphocytes Relative: 26 % (ref 12–46)
MCH: 31.5 pg (ref 26.0–34.0)
MCHC: 35 g/dL (ref 30.0–36.0)
MCV: 90 fL (ref 78.0–100.0)
MONO ABS: 0.4 10*3/uL (ref 0.1–1.0)
MONOS PCT: 7 % (ref 3–12)
NEUTROS PCT: 65 % (ref 43–77)
Neutro Abs: 3.5 10*3/uL (ref 1.7–7.7)
Platelets: 264 10*3/uL (ref 150–400)
RBC: 4.89 MIL/uL (ref 3.87–5.11)
RDW: 13.1 % (ref 11.5–15.5)
WBC: 5.3 10*3/uL (ref 4.0–10.5)

## 2013-12-19 LAB — URINE MICROSCOPIC-ADD ON

## 2013-12-19 LAB — TROPONIN I: Troponin I: <0.3 ng/mL (ref <0.30)

## 2013-12-19 MED ORDER — DIAZEPAM 5 MG PO TABS: 5.0000 mg | ORAL_TABLET | Freq: Three times a day (TID) | ORAL | Status: DC | PRN

## 2013-12-19 MED ORDER — DIAZEPAM 5 MG PO TABS
5.0000 mg | ORAL_TABLET | Freq: Once | ORAL | Status: AC
  Administered 2013-12-19: 5 mg via ORAL
  Filled 2013-12-19: qty 1

## 2013-12-19 MED ORDER — CEPHALEXIN 500 MG PO CAPS: 500.0000 mg | ORAL_CAPSULE | Freq: Four times a day (QID) | ORAL | Status: DC

## 2013-12-19 MED ORDER — DIAZEPAM 5 MG/ML IJ SOLN
5.0000 mg | Freq: Once | INTRAMUSCULAR | Status: AC
  Administered 2013-12-19: 5 mg via INTRAVENOUS
  Filled 2013-12-19: qty 2

## 2013-12-19 NOTE — ED Provider Notes (Signed)
CSN: [id number]     Arrival date & time 12/19/13  1316 History   First MD Initiated Contact with Patient 12/19/13 1554     Chief Complaint  Patient presents with  . Dizziness  . Otalgia   (Consider location/radiation/quality/duration/timing/severity/associated sxs/prior Treatment) HPI Comments: Patient is a 63 year old female past medical history significant for CHF, CAD, DM, CAD, HTN, MI presenting to the ED for continued dizziness with left sided otalgia and tinnitus since being seen on 12/16/12 in the ED for the same complaints. Patient states that the symptoms began one week prior to initial evaluation suddenly with position change at work. Patient states she was beginning to feel better after discharge from the hospital on the 5th, but then took one dose of the Meclizine and developed worsening symptoms.   Patient is a 63 y.o. female presenting with dizziness and ear pain.  Dizziness Otalgia   Past Medical History  Diagnosis Date  . Obesity, morbid   . Chronic systolic heart failure     Chronic systolic CHF  . Hyperlipidemia 08/10/11  . Diabetes mellitus type 2 in obese 08/10/11  . CAD (coronary artery disease) 08/2011    NSTEMI with subsequent CABG 08/13/2011 with LIMA-LAD, SVG-diagonal, SVG-OM, SVG-PDA  . Ischemic cardiomyopathy 08/13/2011    EF 20% in 08/2011, still 25% 10/21/2011  . Pleural effusion     Requiring L thoracentesis 09/02/11  . Mitral regurgitation     Mild by echo 10/21/11  . Hypertension   . Pulmonary hypertension     Echo, September, 2013, 72 mmHg.  Marland Kitchen Ejection fraction < 50%     EF 20%, October, 2012  //  EF 25% December, 2012  //   EF 35%, echo, September, 2013  . Myocardial infarction 08/2011  . Orthopnoea     "progressively worse over last 3 wks" (03/17/2013)  . CHF (congestive heart failure)   . Carpal tunnel syndrome on both sides     "post OHS in 08/2011; resolved now" (03/17/2013)   Past Surgical History  Procedure Laterality Date  . Laparoscopy   1980's?    "removed gallstones; not gallbladder" (03/17/2013)  . Coronary artery bypass graft  08/13/11    CABG x4 with LIMA to LAD, SVG to Diag, SVG to OM, SVG to PDA, EVH via both thighs   Family History  Problem Relation Age of Onset  . Heart disease Father   . Cancer Neg Hx   . Diabetes Neg Hx   . Hypertension Neg Hx   . Kidney disease Neg Hx    History  Substance Use Topics  . Smoking status: Former Smoker    Types: Cigarettes    Quit date: 11/11/1978  . Smokeless tobacco: Never Used     Comment: 03/17/2013 "only a social smoker when I did smoke; ever bought any"  . Alcohol Use: Yes     Comment: 03/17/2013 "in the last 6 months I've had 1 glass of wine"   OB History   Grav Para Term Preterm Abortions TAB SAB Ect Mult Living                 Review of Systems  HENT: Positive for ear pain.   Neurological: Positive for dizziness.    Allergies  Review of patient's allergies indicates no known allergies.  Home Medications   Current Outpatient Rx  Name  Route  Sig  Dispense  Refill  . aspirin 81 MG tablet   Oral   Take 1 tablet (81 mg total)  by mouth daily.   30 tablet   9   . carvedilol (COREG) 25 MG tablet   Oral   Take 1 tablet (25 mg total) by mouth 2 (two) times daily.   180 tablet   0     Increase in dose   . glipiZIDE (GLUCOTROL) 10 MG tablet   Oral   Take 1 tablet (10 mg total) by mouth 2 (two) times daily before a meal.   180 tablet   0   . lisinopril (PRINIVIL,ZESTRIL) 10 MG tablet   Oral   Take 1 tablet (10 mg total) by mouth daily.   90 tablet   0     Increase in dose   . Propylene Glycol (SYSTANE BALANCE) 0.6 % SOLN   Ophthalmic   Apply 2 drops to eye 3 (three) times daily.         Marland Kitchen. torsemide (DEMADEX) 20 MG tablet   Oral   Take 2 tablets (40 mg total) by mouth 2 (two) times daily.   360 tablet   3    BP 123/76  Pulse 62  Temp(Src) 98.3 F (36.8 C) (Oral)  Resp 16  Ht 5\' 7"  (1.702 m)  Wt 262 lb (118.842 kg)  BMI 41.03 kg/m2   SpO2 100% Physical Exam  Constitutional: She is oriented to person, place, and time. She appears well-developed and well-nourished. No distress.  HENT:  Head: Normocephalic and atraumatic.  Right Ear: Hearing, tympanic membrane, external ear and ear canal normal.  Left Ear: Hearing, external ear and ear canal normal. Tympanic membrane is scarred.  Nose: Nose normal.  Mouth/Throat: Uvula is midline, oropharynx is clear and moist and mucous membranes are normal. No oropharyngeal exudate.  L TM w/ sclerotic changes.   Eyes: Conjunctivae and EOM are normal. Pupils are equal, round, and reactive to light.  Neck: Normal range of motion. Neck supple.  Cardiovascular: Normal rate, regular rhythm, normal heart sounds and intact distal pulses.   Pulmonary/Chest: Effort normal and breath sounds normal. No respiratory distress.  Abdominal: Soft. There is no tenderness.  Neurological: She is alert and oriented to person, place, and time. She has normal strength. No cranial nerve deficit or sensory deficit. She displays a negative Romberg sign. Gait normal. GCS eye subscore is 4. GCS verbal subscore is 5. GCS motor subscore is 6.  No pronator drift. Bilateral heel-knee-shin intact. Bilateral finger-nose-finger intact.   Skin: Skin is warm and dry. She is not diaphoretic.    ED Course  Procedures (including critical care time) Medications  diazepam (VALIUM) injection 5 mg (5 mg Intravenous Given 12/19/13 1651)    Labs Review Labs Reviewed  CBC WITH DIFFERENTIAL - Abnormal; Notable for the following:    Hemoglobin 15.4 (*)    All other components within normal limits  BASIC METABOLIC PANEL - Abnormal; Notable for the following:    Sodium 135 (*)    Chloride 94 (*)    Glucose, Bld 372 (*)    GFR calc non Af Amer 74 (*)    GFR calc Af Amer 86 (*)    All other components within normal limits  URINALYSIS, ROUTINE W REFLEX MICROSCOPIC - Abnormal; Notable for the following:    APPearance CLOUDY (*)     Glucose, UA 500 (*)    Hgb urine dipstick MODERATE (*)    Leukocytes, UA MODERATE (*)    All other components within normal limits  URINE MICROSCOPIC-ADD ON - Abnormal; Notable for the following:  Bacteria, UA FEW (*)    Casts HYALINE CASTS (*)    All other components within normal limits  URINE CULTURE  TROPONIN I   Imaging Review Mr Brain Wo Contrast  12/19/2013   CLINICAL DATA:  Dizziness.  Left-sided otalgia.  EXAM: MRI HEAD WITHOUT CONTRAST  TECHNIQUE: Multiplanar, multiecho pulse sequences of the brain and surrounding structures were obtained without intravenous contrast.  COMPARISON:  None.  FINDINGS: Diffusion imaging does not show any acute or subacute infarction. The brainstem and cerebellum are normal except for a few old small vessel cerebellar infarctions. The cerebral hemispheres show mild chronic small-vessel change of the deep white matter. No cortical or large vessel territory infarction. No mass lesion, hemorrhage, hydrocephalus or extra-axial collection. CP angle regions are normal. Seventh and eighth nerve complexes appear normal as seen. No pituitary mass. No fluid in the sinuses, middle ears or mastoids. No soft tissue lesion seen in the region of the left ear. No skull base lesion.  IMPRESSION: No acute or significant finding. Mild chronic small-vessel change. No cause of left-sided otalgia identified.   Electronically Signed   By: Paulina Fusi M.D.   On: 12/19/2013 18:27    EKG Interpretation   None       MDM   1. Vertigo   2. Sclerosis, tympanic   3. Tinnitus, left   4. UTI (urinary tract infection)     Filed Vitals:   12/19/13 1817  BP: 123/76  Pulse: 62  Temp:   Resp:     Anion Gap 15. No evidence of DKA.   Afebrile, NAD, non-toxic appearing, AAOx4. No neural focal deficits on examination. No changes in neurologic examination from previous ED visit on the 5th. Sclerotic changes to L TM noted. Blood work was repeated and without acute abnormality.  Urine did note urinary tract infection. EKG unremarkable. MRI was obtained and without acute abnormality, no evidence of acute finding. Valium did help alleviate patient's vertigo, which again appears to be a peripheral cause rather than central cause. Will have patient followup with ear nose and throat for sclerotic changes and followup with outpatient neurology for vertigo. Precautions discussed. Patient to plan. Patient is agreeable to plan. Patient d/w with Dr. Redgie Grayer, agrees with plan.          Jeannetta Ellis, PA-C 12/20/13 0112  Lise Auer Kenniyah Sasaki, PA-C 12/20/13 1610

## 2013-12-19 NOTE — Discharge Instructions (Signed)
Please follow up with your primary care physician in 1-2 days. If you do not have one please call the [facility] number listed above. Please follow up with Dr. Pollyann Kennedyosen, ENT, and one of the [facility] to schedule a follow up appointment. Please take Valium as prescribed for vertigo. Please take your antibiotic until completion. Please read all discharge instructions and return precautions.    Benign Positional Vertigo Vertigo means you feel like you or your surroundings are moving when they are not. Benign positional vertigo is the most common form of vertigo. Benign means that the cause of your condition is not serious. Benign positional vertigo is more common in older adults. CAUSES  Benign positional vertigo is the result of an upset in the labyrinth system. This is an area in the middle ear that helps control your balance. This may be caused by a viral infection, head injury, or repetitive motion. However, often no specific cause is found. SYMPTOMS  Symptoms of benign positional vertigo occur when you move your head or eyes in different directions. Some of the symptoms may include:  Loss of balance and falls.  Vomiting.  Blurred vision.  Dizziness.  Nausea.  Involuntary eye movements (nystagmus). DIAGNOSIS  Benign positional vertigo is usually diagnosed by physical exam. If the specific cause of your benign positional vertigo is unknown, your caregiver may perform imaging tests, such as magnetic resonance imaging (MRI) or computed tomography (CT). TREATMENT  Your caregiver may recommend movements or procedures to correct the benign positional vertigo. Medicines such as meclizine, benzodiazepines, and medicines for nausea may be used to treat your symptoms. In rare cases, if your symptoms are caused by certain conditions that affect the inner ear, you may need surgery. HOME CARE INSTRUCTIONS   Follow your caregiver's instructions.  Move slowly. Do  not make sudden body or head movements.  Avoid driving.  Avoid operating heavy machinery.  Avoid performing any tasks that would be dangerous to you or others during a vertigo episode.  Drink enough fluids to keep your urine clear or pale yellow. SEEK IMMEDIATE MEDICAL CARE IF:   You develop problems with walking, weakness, numbness, or using your arms, hands, or legs.  You have difficulty speaking.  You develop severe headaches.  Your nausea or vomiting continues or gets worse.  You develop visual changes.  Your family or friends notice any behavioral changes.  Your condition gets worse.  You have a fever.  You develop a stiff neck or sensitivity to light. MAKE SURE YOU:   Understand these instructions.  Will watch your condition.  Will get help right away if you are not doing well or get worse. Document Released: 08/05/2006 Document Revised: 01/20/2012 Document Reviewed: 07/18/2011 [facility] Patient Information 2014 [place] [place]. Tinnitus Sounds you hear in your ears and coming from within the ear is called tinnitus. This can be a symptom of many ear disorders. It is often associated with hearing loss.  Tinnitus can be seen with: Infections. Ear blockages such as wax buildup. Meniere's disease. Ear damage. Inherited. Occupational causes. While irritating, it is not usually a threat to health. When the cause of the tinnitus is wax, infection in the middle ear, or foreign body it is easily treated. Hearing loss will usually be reversible.  TREATMENT  When treating the underlying cause does not get rid of tinnitus, it may be necessary to get rid of the unwanted sound by covering it up with more pleasant background noises. This may  include music, the radio etc. There are tinnitus maskers which can be worn which produce background noise to cover up the tinnitus. Avoid all medications which tend to make tinnitus worse such as alcohol, caffeine, aspirin, and nicotine.  There are many soothing background tapes such as rain, ocean, thunderstorms, etc. These soothing sounds help with sleeping or resting. Keep all follow-up appointments and referrals. This is important to identify the cause of the problem. It also helps avoid complications, impaired hearing, disability, or chronic pain. Document Released: 10/28/2005 Document Revised: 01/20/2012 Document Reviewed: 06/15/2008 [facility] Patient Information 2014 [place], [place].  Urinary Tract Infection Urinary tract infections (UTIs) can develop anywhere along your urinary tract. Your urinary tract is your body's drainage system for removing wastes and extra water. Your urinary tract includes two kidneys, two ureters, a bladder, and a urethra. Your kidneys are a pair of bean-shaped organs. Each kidney is about the size of your fist. They are located below your ribs, one on each side of your spine. CAUSES Infections are caused by microbes, which are microscopic organisms, including fungi, viruses, and bacteria. These organisms are so small that they can only be seen through a microscope. Bacteria are the microbes that most commonly cause UTIs. SYMPTOMS  Symptoms of UTIs may vary by age and gender of the patient and by the location of the infection. Symptoms in young women typically include a frequent and intense urge to urinate and a painful, burning feeling in the bladder or urethra during urination. Older women and men are more likely to be tired, shaky, and weak and have muscle aches and abdominal pain. A fever may mean the infection is in your kidneys. Other symptoms of a kidney infection include pain in your back or sides below the ribs, nausea, and vomiting. DIAGNOSIS To diagnose a UTI, your caregiver will ask you about your symptoms. Your caregiver also will ask to provide a urine sample. The urine sample will be tested for bacteria and white blood cells. White blood cells are made by your body to help fight  infection. TREATMENT  Typically, UTIs can be treated with medication. Because most UTIs are caused by a bacterial infection, they usually can be treated with the use of antibiotics. The choice of antibiotic and length of treatment depend on your symptoms and the type of bacteria causing your infection. HOME CARE INSTRUCTIONS  If you were prescribed antibiotics, take them exactly as your caregiver instructs you. Finish the medication even if you feel better after you have only taken some of the medication.  Drink enough water and fluids to keep your urine clear or pale yellow.  Avoid caffeine, tea, and carbonated beverages. They tend to irritate your bladder.  Empty your bladder often. Avoid holding urine for long periods of time.  Empty your bladder before and after sexual intercourse.  After a bowel movement, women should cleanse from front to back. Use each tissue only once. SEEK MEDICAL CARE IF:   You have back pain.  You develop a fever.  Your symptoms do not begin to resolve within 3 days. SEEK IMMEDIATE MEDICAL CARE IF:   You have severe back pain or lower abdominal pain.  You develop chills.  You have nausea or vomiting.  You have continued burning or discomfort with urination. MAKE SURE YOU:   Understand these instructions.  Will watch your condition.  Will get help right away if you are not doing well or get worse. Document Released: 08/07/2005 Document Revised: 04/28/2012 Document Reviewed: 12/06/2011  Reviewed: 01/07/2012 °Exit[employer]® Patient Information ©2014 [employer]. ° °

## 2013-12-19 NOTE — ED Notes (Signed)
Patient still in MRI.  

## 2013-12-19 NOTE — ED Notes (Signed)
Pt presents to department for evaluation of L ear "fullness" and dizziness. Was seen last week for L ear issue. Was prescribed meclizine and ativan. No relief of symptoms. Pt states L ear pain increased, and also states high pitched noises and full feeling to ear. Pt is alert and oriented x4. Respirations unlabored.

## 2013-12-19 NOTE — ED Notes (Signed)
Patient returned from MRI.

## 2013-12-20 LAB — URINE CULTURE: Colony Count: 30000

## 2013-12-20 NOTE — ED Provider Notes (Signed)
Medical screening examination/treatment/procedure(s) were performed by non-physician practitioner and as supervising physician I was immediately available for consultation/collaboration.  EKG Interpretation   None         Darlys Galesavid Leovanni Bjorkman, MD 12/20/13 1328

## 2013-12-21 ENCOUNTER — Other Ambulatory Visit: Payer: Self-pay | Admitting: *Deleted

## 2013-12-21 DIAGNOSIS — I255 Ischemic cardiomyopathy: Secondary | ICD-10-CM

## 2013-12-21 DIAGNOSIS — I2510 Atherosclerotic heart disease of native coronary artery without angina pectoris: Secondary | ICD-10-CM

## 2013-12-21 MED ORDER — LISINOPRIL 10 MG PO TABS: 10.0000 mg | ORAL_TABLET | Freq: Every day | ORAL | Status: DC

## 2013-12-21 MED ORDER — CARVEDILOL 25 MG PO TABS: 25.0000 mg | ORAL_TABLET | Freq: Two times a day (BID) | ORAL | Status: DC

## 2013-12-28 ENCOUNTER — Ambulatory Visit: Payer: Managed Care, Other (non HMO) | Admitting: Cardiology

## 2014-01-31 ENCOUNTER — Ambulatory Visit: Payer: Managed Care, Other (non HMO) | Admitting: Cardiology

## 2014-02-18 ENCOUNTER — Encounter: Payer: Self-pay | Admitting: Cardiology

## 2014-03-04 ENCOUNTER — Encounter: Payer: Self-pay | Admitting: Cardiology

## 2014-03-21 ENCOUNTER — Other Ambulatory Visit: Payer: Self-pay | Admitting: *Deleted

## 2014-03-21 DIAGNOSIS — I2510 Atherosclerotic heart disease of native coronary artery without angina pectoris: Secondary | ICD-10-CM

## 2014-03-21 DIAGNOSIS — I255 Ischemic cardiomyopathy: Secondary | ICD-10-CM

## 2014-03-21 MED ORDER — CARVEDILOL 25 MG PO TABS: 25.0000 mg | ORAL_TABLET | Freq: Two times a day (BID) | ORAL | Status: DC

## 2014-03-21 MED ORDER — LISINOPRIL 10 MG PO TABS: 10.0000 mg | ORAL_TABLET | Freq: Every day | ORAL | Status: DC

## 2014-04-13 ENCOUNTER — Ambulatory Visit (INDEPENDENT_AMBULATORY_CARE_PROVIDER_SITE_OTHER): Payer: Managed Care, Other (non HMO) | Admitting: Cardiology

## 2014-04-13 ENCOUNTER — Encounter: Payer: Self-pay | Admitting: Cardiology

## 2014-04-13 VITALS — BP 110/70 | HR 56 | Ht 67.0 in | Wt 262.0 lb

## 2014-04-13 DIAGNOSIS — I255 Ischemic cardiomyopathy: Secondary | ICD-10-CM

## 2014-04-13 DIAGNOSIS — I5022 Chronic systolic (congestive) heart failure: Secondary | ICD-10-CM

## 2014-04-13 DIAGNOSIS — E785 Hyperlipidemia, unspecified: Secondary | ICD-10-CM

## 2014-04-13 DIAGNOSIS — I2510 Atherosclerotic heart disease of native coronary artery without angina pectoris: Secondary | ICD-10-CM

## 2014-04-13 DIAGNOSIS — I2589 Other forms of chronic ischemic heart disease: Secondary | ICD-10-CM

## 2014-04-13 MED ORDER — ATORVASTATIN CALCIUM 10 MG PO TABS: 10.0000 mg | ORAL_TABLET | Freq: Every day | ORAL | Status: DC

## 2014-04-13 NOTE — Assessment & Plan Note (Signed)
Coronary disease is stable at this time. No change in therapy. 

## 2014-04-13 NOTE — Assessment & Plan Note (Signed)
Her volume status is stable. No change in therapy. 

## 2014-04-13 NOTE — Assessment & Plan Note (Signed)
The patient has known coronary disease. Historically there was question of myalgias. However when I took her off her medication this did not help her symptoms. I will be encouraging her to restart any statin.

## 2014-04-13 NOTE — Progress Notes (Signed)
Patient ID: Christine Knight, female   DOB: 11/20/1950, 63 y.o.   MRN: [id number]    HPI  Patient is seen today to followup coronary disease and cardiomyopathy. Saw her last in the office January, 2015. At that time her carvedilol was increased to 25 twice a day. She cannot take spironolactone. She's been stable since her last visit. She's not having any chest pain or shortness of breath.  She has had some significant vertigo that is now improved.  No Known Allergies  Current Outpatient Prescriptions  Medication Sig Dispense Refill  . aspirin 81 MG tablet Take 1 tablet (81 mg total) by mouth daily.  30 tablet  9  . glipiZIDE (GLUCOTROL) 10 MG tablet Take 1 tablet (10 mg total) by mouth 2 (two) times daily before a meal.  180 tablet  0  . lisinopril (PRINIVIL,ZESTRIL) 10 MG tablet Take 1 tablet (10 mg total) by mouth daily.  90 tablet  0  . torsemide (DEMADEX) 20 MG tablet Take 2 tablets (40 mg total) by mouth 2 (two) times daily.  360 tablet  3  . [DISCONTINUED] simvastatin (ZOCOR) 20 MG tablet Take 1 tablet (20 mg total) by mouth every evening.  90 tablet  1   No current facility-administered medications for this visit.    History   Social History  . Marital Status: Divorced    Spouse Name: N/A    Number of Children: N/A  . Years of Education: N/A   Occupational History  . Not on file.   Social History Main Topics  . Smoking status: Former Smoker    Types: Cigarettes    Quit date: 11/11/1978  . Smokeless tobacco: Never Used     Comment: 03/17/2013 "only a social smoker when I did smoke; ever bought any"  . Alcohol Use: Yes     Comment: 03/17/2013 "in the last 6 months I've had 1 glass of wine"  . Drug Use: No  . Sexual Activity: Not Currently    Birth Control/ Protection: Post-menopausal   Other Topics Concern  . Not on file   Social History Narrative  . No narrative on file    Family History  Problem Relation Age of Onset  . Heart disease Father   . Cancer Neg Hx     . Diabetes Neg Hx   . Hypertension Neg Hx   . Kidney disease Neg Hx     Past Medical History  Diagnosis Date  . Obesity, morbid   . Chronic systolic heart failure     Chronic systolic CHF  . Hyperlipidemia 08/10/11  . Diabetes mellitus type 2 in obese 08/10/11  . CAD (coronary artery disease) 08/2011    NSTEMI with subsequent CABG 08/13/2011 with LIMA-LAD, SVG-diagonal, SVG-OM, SVG-PDA  . Ischemic cardiomyopathy 08/13/2011    EF 20% in 08/2011, still 25% 10/21/2011  . Pleural effusion     Requiring L thoracentesis 09/02/11  . Mitral regurgitation     Mild by echo 10/21/11  . Hypertension   . Pulmonary hypertension     Echo, September, 2013, 72 mmHg.  Marland Kitchen Ejection fraction < 50%     EF 20%, October, 2012  //  EF 25% December, 2012  //   EF 35%, echo, September, 2013  . Myocardial infarction 08/2011  . Orthopnoea     "progressively worse over last 3 wks" (03/17/2013)  . CHF (congestive heart failure)   . Carpal tunnel syndrome on both sides     "post OHS in 08/2011; resolved  now" (03/17/2013)    Past Surgical History  Procedure Laterality Date  . Laparoscopy  1980's?    "removed gallstones; not gallbladder" (03/17/2013)  . Coronary artery bypass graft  08/13/11    CABG x4 with LIMA to LAD, SVG to Diag, SVG to OM, SVG to PDA, EVH via both thighs    Patient Active Problem List   Diagnosis Date Noted  . Spironolactone adverse reaction 11/14/2013  . Hyperkalemia 06/17/2013  . Chronic systolic heart failure 04/02/2013  . Abdominal pain 03/22/2013  . Hypothyroidism 03/18/2013  . Pulmonary hypertension   . Mitral regurgitation   . Anemia 10/25/2011  . CAD (coronary artery disease) 08/30/2011  . Obesity, morbid   . Diabetes mellitus type 2 in obese   . Ischemic cardiomyopathy 08/13/2011  . Hyperlipidemia 08/10/2011    ROS   Patient denies fever, chills, headache, sweats, rash, change in vision, change in hearing, chest pain, cough, nausea vomiting, urinary symptoms. All other  systems are reviewed and are negative other than the history of present illness.  PHYSICAL EXAM  Patient is overweight. She is oriented to person time and place. Affect is normal. Head is atraumatic. Sclera and conjunctiva are normal. There is no jugulovenous distention. Lungs are clear. Respiratory effort is nonlabored. Cardiac exam reveals S1 and S2. Abdomen is soft. She is overweight. There is no significant peripheral edema. There are no musculoskeletal deformities. There are no skin rashes.  Filed Vitals:   04/13/14 1533  BP: 110/70  Pulse: 56  Height: 5\' 7"  (1.702 m)  Weight: 262 lb (118.842 kg)     ASSESSMENT & PLAN

## 2014-04-13 NOTE — Assessment & Plan Note (Signed)
She is taking medications for LV dysfunction. She cannot take spironolactone. She has not wanted to consider an ICD.

## 2014-04-13 NOTE — Patient Instructions (Signed)
**Note De-Identified Sofie Schendel Obfuscation** Your physician has recommended you make the following change in your medication: start taking Atorvastatin 10 mg at bedtime.  Your physician wants you to follow-up in: 6 months. You will receive a reminder letter in the mail two months in advance. If you don't receive a letter, please call our office to schedule the follow-up appointment.

## 2014-05-04 ENCOUNTER — Telehealth: Payer: Self-pay | Admitting: Cardiology

## 2014-05-04 NOTE — Telephone Encounter (Signed)
LMTCB

## 2014-05-04 NOTE — Telephone Encounter (Signed)
**Note De-Identified Airik Goodlin Obfuscation** The pt is advised to go to urgent care, since she has no PCP, to be evaluated for her knee and joint pains and that if the MD there agrees that she needs referral for rheumatologist they will refer her. She verbalized understanding and agrees with plan.

## 2014-05-04 NOTE — Telephone Encounter (Signed)
New message         Pt needs a referral to a rheumatologist / please give pt a call

## 2014-08-08 ENCOUNTER — Other Ambulatory Visit: Payer: Self-pay

## 2014-08-08 ENCOUNTER — Telehealth: Payer: Self-pay | Admitting: Cardiology

## 2014-08-08 MED ORDER — ATORVASTATIN CALCIUM 10 MG PO TABS
10.0000 mg | ORAL_TABLET | Freq: Every day | ORAL | Status: DC
Start: 2014-08-08 — End: 2014-12-12

## 2014-08-08 MED ORDER — LISINOPRIL 10 MG PO TABS: 10.0000 mg | ORAL_TABLET | Freq: Every day | ORAL | Status: DC

## 2014-08-08 MED ORDER — CARVEDILOL 25 MG PO TABS: 25.0000 mg | ORAL_TABLET | Freq: Two times a day (BID) | ORAL | Status: DC

## 2014-08-08 NOTE — Telephone Encounter (Signed)
New message           Pt needs a referral to see someone for her joint paints / pt has no PCP

## 2014-08-08 NOTE — Telephone Encounter (Signed)
The pt called the office on 05/04/14 with c/o a lot of pain in her knees and joints and was advised as follows: The pt is advised to go to urgent care, since she has no PCP, to be evaluated for her knee and joint pains and that if the MD there agrees that she needs referral for rheumatologist they will refer her. She verbalized understanding and agrees with plan.  The pt called back today and states that she did not go to urgent care at that time (she would not say why) and that her feet and joints are really hurting her now. I again recommended that she go to ER or Urgent care but she wants a referral from Dr Myrtis Ser as she has no PCP. Please advise.

## 2014-08-19 LAB — HM DIABETES EYE EXAM

## 2014-08-29 ENCOUNTER — Encounter: Payer: Self-pay | Admitting: Cardiology

## 2014-10-07 ENCOUNTER — Other Ambulatory Visit: Payer: Self-pay

## 2014-10-07 ENCOUNTER — Other Ambulatory Visit: Payer: Self-pay | Admitting: Cardiology

## 2014-10-07 MED ORDER — GLIPIZIDE 10 MG PO TABS: 10.0000 mg | ORAL_TABLET | Freq: Two times a day (BID) | ORAL | Status: DC

## 2014-10-07 NOTE — Telephone Encounter (Signed)
Patient  Requesting a refill of Glipizide. She was supposed to have found a PCP back in February of this year. Advised her we could refill for one month only. She states this is OK, as she has an appointment with someone next week.

## 2014-10-10 ENCOUNTER — Ambulatory Visit (INDEPENDENT_AMBULATORY_CARE_PROVIDER_SITE_OTHER): Payer: Managed Care, Other (non HMO) | Admitting: Internal Medicine

## 2014-10-10 ENCOUNTER — Encounter: Payer: Self-pay | Admitting: Internal Medicine

## 2014-10-10 ENCOUNTER — Other Ambulatory Visit (INDEPENDENT_AMBULATORY_CARE_PROVIDER_SITE_OTHER): Payer: Managed Care, Other (non HMO)

## 2014-10-10 VITALS — BP 112/70 | HR 62 | Temp 98.6°F | Resp 16 | Ht 66.0 in | Wt 276.0 lb

## 2014-10-10 DIAGNOSIS — E669 Obesity, unspecified: Secondary | ICD-10-CM

## 2014-10-10 DIAGNOSIS — E785 Hyperlipidemia, unspecified: Secondary | ICD-10-CM

## 2014-10-10 DIAGNOSIS — Z23 Encounter for immunization: Secondary | ICD-10-CM

## 2014-10-10 DIAGNOSIS — I270 Primary pulmonary hypertension: Secondary | ICD-10-CM

## 2014-10-10 DIAGNOSIS — E119 Type 2 diabetes mellitus without complications: Secondary | ICD-10-CM

## 2014-10-10 DIAGNOSIS — E1169 Type 2 diabetes mellitus with other specified complication: Secondary | ICD-10-CM

## 2014-10-10 DIAGNOSIS — E1140 Type 2 diabetes mellitus with diabetic neuropathy, unspecified: Secondary | ICD-10-CM

## 2014-10-10 DIAGNOSIS — E038 Other specified hypothyroidism: Secondary | ICD-10-CM

## 2014-10-10 DIAGNOSIS — I272 Pulmonary hypertension, unspecified: Secondary | ICD-10-CM

## 2014-10-10 LAB — CBC WITH DIFFERENTIAL/PLATELET
BASOS ABS: 0 10*3/uL (ref 0.0–0.1)
Basophils Relative: 0.7 % (ref 0.0–3.0)
Eosinophils Absolute: 0.3 10*3/uL (ref 0.0–0.7)
Eosinophils Relative: 5 % (ref 0.0–5.0)
HCT: 41 % (ref 36.0–46.0)
Hemoglobin: 13.3 g/dL (ref 12.0–15.0)
Lymphocytes Relative: 17.2 % (ref 12.0–46.0)
Lymphs Abs: 1.1 10*3/uL (ref 0.7–4.0)
MCHC: 32.5 g/dL (ref 30.0–36.0)
MCV: 92.8 fl (ref 78.0–100.0)
MONO ABS: 0.4 10*3/uL (ref 0.1–1.0)
Monocytes Relative: 6.8 % (ref 3.0–12.0)
Neutro Abs: 4.5 10*3/uL (ref 1.4–7.7)
Neutrophils Relative %: 70.3 % (ref 43.0–77.0)
PLATELETS: 243 10*3/uL (ref 150.0–400.0)
RBC: 4.42 Mil/uL (ref 3.87–5.11)
RDW: 14.7 % (ref 11.5–15.5)
WBC: 6.3 10*3/uL (ref 4.0–10.5)

## 2014-10-10 LAB — URINALYSIS, ROUTINE W REFLEX MICROSCOPIC
BILIRUBIN URINE: NEGATIVE
Ketones, ur: NEGATIVE
Nitrite: NEGATIVE
Specific Gravity, Urine: 1.01 (ref 1.000–1.030)
TOTAL PROTEIN, URINE-UPE24: NEGATIVE
URINE GLUCOSE: NEGATIVE
Urobilinogen, UA: 1 (ref 0.0–1.0)
pH: 6 (ref 5.0–8.0)

## 2014-10-10 LAB — LIPID PANEL
CHOL/HDL RATIO: 6
CHOLESTEROL: 148 mg/dL (ref 0–200)
HDL: 26.1 mg/dL — ABNORMAL LOW (ref >39.00)
LDL Cholesterol: 105 mg/dL — ABNORMAL HIGH (ref 0–99)
NonHDL: 121.9
TRIGLYCERIDES: 87 mg/dL (ref 0.0–149.0)
VLDL: 17.4 mg/dL (ref 0.0–40.0)

## 2014-10-10 LAB — COMPREHENSIVE METABOLIC PANEL
ALK PHOS: 116 U/L (ref 39–117)
ALT: 23 U/L (ref 0–35)
AST: 27 U/L (ref 0–37)
Albumin: 3.7 g/dL (ref 3.5–5.2)
BUN: 32 mg/dL — ABNORMAL HIGH (ref 6–23)
CO2: 27 mEq/L (ref 19–32)
Calcium: 9 mg/dL (ref 8.4–10.5)
Chloride: 99 mEq/L (ref 96–112)
Creatinine, Ser: 1.1 mg/dL (ref 0.4–1.2)
GFR: 67.29 mL/min (ref >60.00)
GLUCOSE: 127 mg/dL — AB (ref 70–99)
POTASSIUM: 3.8 meq/L (ref 3.5–5.1)
SODIUM: 136 meq/L (ref 135–145)
TOTAL PROTEIN: 7.7 g/dL (ref 6.0–8.3)
Total Bilirubin: 0.9 mg/dL (ref 0.2–1.2)

## 2014-10-10 LAB — MICROALBUMIN / CREATININE URINE RATIO
CREATININE, U: 67.2 mg/dL
MICROALB UR: 1.8 mg/dL (ref 0.0–1.9)
Microalb Creat Ratio: 2.7 mg/g (ref 0.0–30.0)

## 2014-10-10 LAB — HEMOGLOBIN A1C: Hgb A1c MFr Bld: 8.6 % — ABNORMAL HIGH (ref 4.6–6.5)

## 2014-10-10 LAB — CK: CK TOTAL: 117 U/L (ref 7–177)

## 2014-10-10 LAB — SEDIMENTATION RATE: Sed Rate: 33 mm/hr — ABNORMAL HIGH (ref 0–22)

## 2014-10-10 LAB — TSH: TSH: 2.55 u[IU]/mL (ref 0.35–4.50)

## 2014-10-10 MED ORDER — OXYCODONE-ACETAMINOPHEN 7.5-325 MG PO TABS: 1.0000 | ORAL_TABLET | Freq: Three times a day (TID) | ORAL | Status: DC | PRN

## 2014-10-10 NOTE — Progress Notes (Signed)
Pre visit review using our clinic review tool, if applicable. No additional management support is needed unless otherwise documented below in the visit note. 

## 2014-10-10 NOTE — Progress Notes (Signed)
Subjective:    Patient ID: Christine Knight, female    DOB: [birth year], 63 y.o.   MRN: [id number]  Diabetes She presents for her follow-up diabetic visit. She has type 2 diabetes mellitus. Her disease course has been worsening. There are no hypoglycemic associated symptoms. Associated symptoms include polydipsia. Pertinent negatives for diabetes include no blurred vision, no chest pain, no fatigue, no foot paresthesias, no foot ulcerations, no polyphagia, no polyuria, no visual change, no weakness and no weight loss. There are no hypoglycemic complications. Symptoms are worsening. Diabetic complications include peripheral neuropathy. Current diabetic treatment includes oral agent (monotherapy). She is compliant with treatment most of the time. Her weight is stable. She is following a generally healthy diet. Meal planning includes avoidance of concentrated sweets. She never participates in exercise. Her home blood glucose trend is increasing steadily. An ACE inhibitor/angiotensin II receptor blocker is being taken. She does not see a podiatrist.Eye exam is current.      Review of Systems  Constitutional: Positive for unexpected weight change (wt gain). Negative for fever, chills, weight loss, diaphoresis, appetite change and fatigue.  HENT: Negative.   Eyes: Negative.  Negative for blurred vision.  Respiratory: Negative.  Negative for cough, choking, chest tightness, shortness of breath and stridor.   Cardiovascular: Negative.  Negative for chest pain, palpitations and leg swelling.  Gastrointestinal: Negative.  Negative for nausea, vomiting, abdominal pain, diarrhea and constipation.  Endocrine: Positive for polydipsia. Negative for polyphagia and polyuria.  Genitourinary: Negative.  Negative for urgency, frequency, hematuria and difficulty urinating.  Musculoskeletal: Positive for arthralgias. Negative for myalgias, back pain, joint swelling, gait problem, neck pain and neck stiffness.   She complains of severe stabbing and burning pain in her knees and down to her feet  Skin: Negative.   Allergic/Immunologic: Negative.   Neurological: Negative.  Negative for weakness.  Hematological: Negative.  Negative for adenopathy. Does not bruise/bleed easily.  Psychiatric/Behavioral: Negative.        Objective:   Physical Exam  Constitutional: She is oriented to person, place, and time. She appears well-developed and well-nourished. No distress.  HENT:  Head: Normocephalic and atraumatic.  Mouth/Throat: Oropharynx is clear and moist. No oropharyngeal exudate.  Eyes: Conjunctivae are normal. Right eye exhibits no discharge. Left eye exhibits no discharge. No scleral icterus.  Neck: Normal range of motion. Neck supple. No JVD present. No tracheal deviation present. No thyromegaly present.  Cardiovascular: Normal rate, regular rhythm, normal heart sounds and intact distal pulses.  Exam reveals no gallop and no friction rub.   No murmur heard. Pulmonary/Chest: Effort normal and breath sounds normal. No stridor. No respiratory distress. She has no wheezes. She has no rales. She exhibits no tenderness.  Abdominal: Soft. Bowel sounds are normal. She exhibits no distension and no mass. There is no tenderness. There is no rebound and no guarding.  Musculoskeletal: Normal range of motion. She exhibits no edema or tenderness.  Lymphadenopathy:    She has no cervical adenopathy.  Neurological: She is oriented to person, place, and time. She has normal strength. She displays no atrophy, no tremor and normal reflexes. No cranial nerve deficit or sensory deficit. She exhibits normal muscle tone. She displays a negative Romberg sign. She displays no seizure activity. Coordination and gait normal.  Reflex Scores:      Tricep reflexes are 1+ on the right side and 1+ on the left side.      Bicep reflexes are 1+ on the right side and 1+ on the  left side.      Brachioradialis reflexes are 1+ on the  right side and 1+ on the left side.      Patellar reflexes are 1+ on the right side and 1+ on the left side.      Achilles reflexes are 1+ on the right side and 1+ on the left side. Skin: Skin is warm and dry. No rash noted. She is not diaphoretic. No erythema. No pallor.  Vitals reviewed.    Lab Results  Component Value Date   WBC 5.3 12/19/2013   HGB 15.4* 12/19/2013   HCT 44.0 12/19/2013   PLT 264 12/19/2013   GLUCOSE 372* 12/19/2013   CHOL 174 01/30/2012   TRIG 57.0 01/30/2012   HDL 51.00 01/30/2012   LDLCALC 112* 01/30/2012   ALT 15 12/16/2013   AST 21 12/16/2013   NA 135* 12/19/2013   K 4.3 12/19/2013   CL 94* 12/19/2013   CREATININE 0.83 12/19/2013   BUN 22 12/19/2013   CO2 26 12/19/2013   TSH 2.331 03/17/2013   INR 1.09 08/21/2011   HGBA1C 6.7* 03/17/2013       Assessment & Plan:

## 2014-10-10 NOTE — Patient Instructions (Signed)

## 2014-10-11 ENCOUNTER — Encounter: Payer: Self-pay | Admitting: Internal Medicine

## 2014-10-12 MED ORDER — GLUCOSE BLOOD VI STRP: ORAL_STRIP | Status: DC

## 2014-10-12 MED ORDER — EXENATIDE ER 2 MG ~~LOC~~ SUSR: 2.0000 mg | SUBCUTANEOUS | Status: DC

## 2014-10-12 MED ORDER — ONETOUCH VERIO W/DEVICE KIT: 1.0000 | PACK | Freq: Three times a day (TID) | Status: DC

## 2014-10-12 MED ORDER — METFORMIN HCL 1000 MG PO TABS: 1000.0000 mg | ORAL_TABLET | Freq: Two times a day (BID) | ORAL | Status: DC

## 2014-10-12 NOTE — Assessment & Plan Note (Signed)
She will try percocet for the pain

## 2014-10-12 NOTE — Addendum Note (Signed)
Addended by: Etta GrandchildJONES, Matias Thurman L on: 10/12/2014 02:26 PM   Modules accepted: Orders

## 2014-10-12 NOTE — Assessment & Plan Note (Signed)
Her blood sugars are not well controlled, A1C = 8.6 Will start an GLP-1 agonist and metformin

## 2014-10-12 NOTE — Assessment & Plan Note (Signed)
Her BP is well controlled She has no s/s of this Lytes and renal function are stable

## 2014-10-12 NOTE — Assessment & Plan Note (Signed)
Her TSH is on the normal range She will stay on the current synthroid dose

## 2014-11-07 ENCOUNTER — Encounter: Payer: Self-pay | Admitting: Internal Medicine

## 2014-11-07 ENCOUNTER — Ambulatory Visit (INDEPENDENT_AMBULATORY_CARE_PROVIDER_SITE_OTHER): Payer: Managed Care, Other (non HMO) | Admitting: Internal Medicine

## 2014-11-07 VITALS — BP 116/74 | HR 63 | Temp 98.6°F | Resp 16 | Ht 66.0 in | Wt 282.0 lb

## 2014-11-07 DIAGNOSIS — I5022 Chronic systolic (congestive) heart failure: Secondary | ICD-10-CM

## 2014-11-07 DIAGNOSIS — I255 Ischemic cardiomyopathy: Secondary | ICD-10-CM

## 2014-11-07 DIAGNOSIS — E1169 Type 2 diabetes mellitus with other specified complication: Secondary | ICD-10-CM

## 2014-11-07 DIAGNOSIS — E038 Other specified hypothyroidism: Secondary | ICD-10-CM

## 2014-11-07 DIAGNOSIS — M150 Primary generalized (osteo)arthritis: Secondary | ICD-10-CM

## 2014-11-07 DIAGNOSIS — E669 Obesity, unspecified: Secondary | ICD-10-CM

## 2014-11-07 DIAGNOSIS — E119 Type 2 diabetes mellitus without complications: Secondary | ICD-10-CM

## 2014-11-07 DIAGNOSIS — M159 Polyosteoarthritis, unspecified: Secondary | ICD-10-CM | POA: Insufficient documentation

## 2014-11-07 DIAGNOSIS — I270 Primary pulmonary hypertension: Secondary | ICD-10-CM

## 2014-11-07 DIAGNOSIS — I272 Pulmonary hypertension, unspecified: Secondary | ICD-10-CM

## 2014-11-07 MED ORDER — EXENATIDE ER 2 MG ~~LOC~~ SUSR: 2.0000 mg | SUBCUTANEOUS | Status: DC

## 2014-11-07 MED ORDER — GLIPIZIDE 10 MG PO TABS: 10.0000 mg | ORAL_TABLET | Freq: Two times a day (BID) | ORAL | Status: DC

## 2014-11-07 MED ORDER — CARVEDILOL 25 MG PO TABS: 25.0000 mg | ORAL_TABLET | Freq: Two times a day (BID) | ORAL | Status: DC

## 2014-11-07 NOTE — Patient Instructions (Signed)

## 2014-11-08 NOTE — Assessment & Plan Note (Signed)
She has not been compliant with her medications and her blood sugars are not well controlled I have asked her to be more compliant and to work on her lifestyle modifications

## 2014-11-08 NOTE — Progress Notes (Signed)
Subjective:    Patient ID: Christine Knight, female    DOB: Aug 25, 1951, 63 y.o.   MRN: [id number]  Arthritis Presents for follow-up visit. The disease course has been worsening. She complains of pain. She reports no stiffness, joint swelling or joint warmth. Affected locations include the left knee, right ankle, left ankle and right knee. Her pain is at a severity of 2/10. Pertinent negatives include no diarrhea, dry eyes, dry mouth, dysuria, fatigue, fever, pain at night, pain while resting, rash, Raynaud's syndrome, uveitis or weight loss. Her past medical history is significant for osteoarthritis and rheumatoid arthritis. There is no history of chronic back pain, lupus or psoriasis.  Past treatments include acetaminophen and an opioid. The treatment provided moderate relief. Factors aggravating her arthritis include activity.      Review of Systems  Constitutional: Negative.  Negative for fever, chills, weight loss, diaphoresis, appetite change and fatigue.  HENT: Negative.   Eyes: Negative.   Respiratory: Negative.  Negative for cough, choking, chest tightness, shortness of breath and stridor.   Cardiovascular: Negative.  Negative for chest pain, palpitations and leg swelling.  Gastrointestinal: Negative.  Negative for nausea, vomiting, abdominal pain, diarrhea, constipation and blood in stool.  Endocrine: Negative.   Genitourinary: Negative.  Negative for dysuria.  Musculoskeletal: Positive for arthralgias and arthritis. Negative for myalgias, back pain, joint swelling, gait problem, stiffness, neck pain and neck stiffness.  Skin: Negative.  Negative for rash.  Allergic/Immunologic: Negative.   Neurological: Negative.   Hematological: Negative.  Negative for adenopathy. Does not bruise/bleed easily.  Psychiatric/Behavioral: Negative.        Objective:   Physical Exam  Constitutional: She is oriented to person, place, and time. She appears well-developed and well-nourished. No  distress.  HENT:  Head: Normocephalic and atraumatic.  Mouth/Throat: Oropharynx is clear and moist. No oropharyngeal exudate.  Eyes: Conjunctivae are normal. Right eye exhibits no discharge. Left eye exhibits no discharge. No scleral icterus.  Neck: Normal range of motion. Neck supple. No JVD present. No tracheal deviation present. No thyromegaly present.  Cardiovascular: Normal rate, regular rhythm, normal heart sounds and intact distal pulses.  Exam reveals no gallop and no friction rub.   No murmur heard. Pulmonary/Chest: Effort normal and breath sounds normal. No stridor. No respiratory distress. She has no wheezes. She has no rales. She exhibits no tenderness.  Abdominal: Soft. Bowel sounds are normal. She exhibits no distension and no mass. There is no tenderness. There is no rebound and no guarding.  Musculoskeletal: Normal range of motion. She exhibits edema (trace edema in BLE). She exhibits no tenderness.       Right knee: Normal.       Left knee: Normal.       Right ankle: Normal.       Left ankle: Normal.  Lymphadenopathy:    She has no cervical adenopathy.  Neurological: She is oriented to person, place, and time.  Skin: Skin is warm and dry. No rash noted. She is not diaphoretic. No erythema. No pallor.  Vitals reviewed.    Lab Results  Component Value Date   WBC 6.3 10/10/2014   HGB 13.3 10/10/2014   HCT 41.0 10/10/2014   PLT 243.0 10/10/2014   GLUCOSE 127* 10/10/2014   CHOL 148 10/10/2014   TRIG 87.0 10/10/2014   HDL 26.10* 10/10/2014   LDLCALC 105* 10/10/2014   ALT 23 10/10/2014   AST 27 10/10/2014   NA 136 10/10/2014   K 3.8 10/10/2014   CL  99 10/10/2014   CREATININE 1.1 10/10/2014   BUN 32* 10/10/2014   CO2 27 10/10/2014   TSH 2.55 10/10/2014   INR 1.09 08/21/2011   HGBA1C 8.6* 10/10/2014   MICROALBUR 1.8 10/10/2014       Assessment & Plan:

## 2014-11-08 NOTE — Assessment & Plan Note (Signed)
She has stopped taking percocet because she did not like they way it made her feel She wants to see a rheumatologist to be "properly diagnosed"

## 2014-11-08 NOTE — Assessment & Plan Note (Signed)
Her TSH is in the normal range 

## 2014-11-08 NOTE — Assessment & Plan Note (Signed)
Her BP is normal She has a normal fluid status Will cont the current meds for now

## 2014-11-08 NOTE — Addendum Note (Signed)
Addended by: Etta GrandchildJONES, Rylyn Ranganathan L on: 11/08/2014 02:33 PM   Modules accepted: Kipp BroodSmartSet

## 2014-11-09 ENCOUNTER — Other Ambulatory Visit: Payer: Self-pay

## 2014-11-09 MED ORDER — TORSEMIDE 20 MG PO TABS: 40.0000 mg | ORAL_TABLET | Freq: Two times a day (BID) | ORAL | Status: DC

## 2014-11-14 ENCOUNTER — Ambulatory Visit: Payer: Managed Care, Other (non HMO) | Admitting: Cardiology

## 2014-12-12 ENCOUNTER — Encounter: Payer: Self-pay | Admitting: Cardiology

## 2014-12-12 ENCOUNTER — Ambulatory Visit (INDEPENDENT_AMBULATORY_CARE_PROVIDER_SITE_OTHER): Payer: Managed Care, Other (non HMO) | Admitting: Cardiology

## 2014-12-12 VITALS — BP 120/82 | HR 62 | Ht 66.0 in | Wt 280.0 lb

## 2014-12-12 DIAGNOSIS — M159 Polyosteoarthritis, unspecified: Secondary | ICD-10-CM

## 2014-12-12 DIAGNOSIS — I255 Ischemic cardiomyopathy: Secondary | ICD-10-CM

## 2014-12-12 DIAGNOSIS — I270 Primary pulmonary hypertension: Secondary | ICD-10-CM

## 2014-12-12 DIAGNOSIS — I272 Pulmonary hypertension, unspecified: Secondary | ICD-10-CM

## 2014-12-12 DIAGNOSIS — I25810 Atherosclerosis of coronary artery bypass graft(s) without angina pectoris: Secondary | ICD-10-CM

## 2014-12-12 DIAGNOSIS — I5022 Chronic systolic (congestive) heart failure: Secondary | ICD-10-CM

## 2014-12-12 MED ORDER — CARVEDILOL 25 MG PO TABS: 25.0000 mg | ORAL_TABLET | Freq: Two times a day (BID) | ORAL | Status: DC

## 2014-12-12 MED ORDER — ATORVASTATIN CALCIUM 10 MG PO TABS: 10.0000 mg | ORAL_TABLET | Freq: Every day | ORAL | Status: DC

## 2014-12-12 NOTE — Progress Notes (Signed)
Cardiology Office Note   Date:  12/12/2014   ID:  Christine Knight, DOB 1951/01/24, MRN [id number]  PCP:  Scarlette Calico, MD  Cardiologist:  Dola Argyle, MD   Chief Complaint  Patient presents with  . Appointment    Follow-up coronary disease and cardiomyopathy      History of Present Illness: Christine Knight is a 64 y.o. female who presents for follow-up of coronary disease and cardiomyopathy. I saw her last June, 2015. I have tried over time to titrate her medications. She cannot take spironolactone. She is on carvedilol and ACE inhibitor. She has significant left ventricular dysfunction. At some point we will need a follow-up echo for further evaluation to see if they device is indicated. However she's had multiple other medical problems. She has severe arthritic pain. She will be seeing rheumatology in consultation in March. She is not having any chest pain or significant shortness of breath.    Past Medical History  Diagnosis Date  . Obesity, morbid   . Chronic systolic heart failure     Chronic systolic CHF  . Hyperlipidemia 08/10/11  . Diabetes mellitus type 2 in obese 08/10/11  . CAD (coronary artery disease) 08/2011    NSTEMI with subsequent CABG 08/13/2011 with LIMA-LAD, SVG-diagonal, SVG-OM, SVG-PDA  . Ischemic cardiomyopathy 08/13/2011    EF 20% in 08/2011, still 25% 10/21/2011  . Pleural effusion     Requiring L thoracentesis 09/02/11  . Mitral regurgitation     Mild by echo 10/21/11  . Hypertension   . Pulmonary hypertension     Echo, September, 2013, 72 mmHg.  Marland Kitchen Ejection fraction < 50%     EF 20%, October, 2012  //  EF 25% December, 2012  //   EF 35%, echo, September, 2013  . Myocardial infarction 08/2011  . Orthopnoea     "progressively worse over last 3 wks" (03/17/2013)  . CHF (congestive heart failure)   . Carpal tunnel syndrome on both sides     "post OHS in 08/2011; resolved now" (03/17/2013)    Past Surgical History  Procedure Laterality Date  .  Laparoscopy  1980's?    "removed gallstones; not gallbladder" (03/17/2013)  . Coronary artery bypass graft  08/13/11    CABG x4 with LIMA to LAD, SVG to Diag, SVG to OM, SVG to PDA, EVH via both thighs    Patient Active Problem List   Diagnosis Date Noted  . DJD (degenerative joint disease), multiple sites 11/07/2014  . Painful diabetic neuropathy 10/10/2014  . Spironolactone adverse reaction 11/14/2013  . Chronic systolic heart failure 58/83/2549  . Hypothyroidism 03/18/2013  . Pulmonary hypertension   . Mitral regurgitation   . CAD (coronary artery disease) 08/30/2011  . Obesity, morbid   . Diabetes mellitus type 2 in obese   . Ischemic cardiomyopathy 08/13/2011  . Hyperlipidemia LDL goal <70 08/10/2011      Current Outpatient Prescriptions  Medication Sig Dispense Refill  . acetaminophen (TYLENOL) 650 MG CR tablet Take 650 mg by mouth every 8 (eight) hours as needed for pain.    Marland Kitchen aspirin 81 MG tablet Take 1 tablet (81 mg total) by mouth daily. 30 tablet 9  . atorvastatin (LIPITOR) 10 MG tablet Take 1 tablet (10 mg total) by mouth daily. 90 tablet 1  . Blood Glucose Monitoring Suppl (ONETOUCH VERIO) W/DEVICE KIT 1 Act by Does not apply route 3 (three) times daily. 2 kit 0  . carvedilol (COREG) 25 MG tablet Take 1 tablet (25 mg  total) by mouth 2 (two) times daily. 180 tablet 1  . glipiZIDE (GLUCOTROL) 10 MG tablet Take 1 tablet (10 mg total) by mouth 2 (two) times daily before a meal. 180 tablet 1  . glucose blood test strip Use TID 100 each 12  . lisinopril (PRINIVIL,ZESTRIL) 10 MG tablet Take 1 tablet (10 mg total) by mouth daily. 90 tablet 1  . torsemide (DEMADEX) 20 MG tablet Take 2 tablets (40 mg total) by mouth 2 (two) times daily. 360 tablet 3  . [DISCONTINUED] simvastatin (ZOCOR) 20 MG tablet Take 1 tablet (20 mg total) by mouth every evening. 90 tablet 1   No current facility-administered medications for this visit.    Allergies:   Review of patient's allergies  indicates no known allergies.    Social History:  The patient  reports that she quit smoking about 36 years ago. Her smoking use included Cigarettes. She has never used smokeless tobacco. She reports that she drinks alcohol. She reports that she does not use illicit drugs.   Family History:  The patient's family history includes Heart disease in her father. There is no history of Cancer, Diabetes, Hypertension, or Kidney disease.    ROS:  Please see the history of present illness. Patient denies fever, chills, headache, sweats, rash, change in vision, change in hearing, chest pain, cough, nausea or vomiting, urinary symptoms. All other systems are reviewed and are negative.        PHYSICAL EXAM: VS:  BP 120/82 mmHg  Pulse 62  Ht _0  (1.676 m)  Wt 280 lb (127.007 kg)  BMI 45.21 kg/m2 , The patient is overweight. She is stable. She is oriented to person time and place. Affect is normal. Head is atraumatic. Sclera and conjunctiva are normal. There is no jugular venous distention. Lungs are clear. Respiratory effort is not labored. Cardiac exam reveals an S1 and S2. Abdomen is soft there is no peripheral edema. There are no musculoskeletal deformities. There are no skin rashes. Neurologic is grossly intact.  EKG:   EKG is done today and reviewed by me. She has low voltage QRS. This has been seen in the past. There is evidence of an old septal MI.   Recent Labs: 10/10/2014: ALT 23; BUN 32*; Creatinine 1.1; Hemoglobin 13.3; Platelets 243.0; Potassium 3.8; Sodium 136; TSH 2.55    Lipid Panel    Component Value Date/Time   CHOL 148 10/10/2014 1649   TRIG 87.0 10/10/2014 1649   HDL 26.10* 10/10/2014 1649   CHOLHDL 6 10/10/2014 1649   VLDL 17.4 10/10/2014 1649   LDLCALC 105* 10/10/2014 1649      Wt Readings from Last 3 Encounters:  12/12/14 280 lb (127.007 kg)  11/07/14 282 lb (127.914 kg)  10/10/14 276 lb (125.193 kg)      Current medicines are reviewed. I discussed her cardiac  meds at length. We will not make any changes at this time.      ASSESSMENT AND PLAN:

## 2014-12-12 NOTE — Assessment & Plan Note (Signed)
Her volume status is stable. No change in therapy. 

## 2014-12-12 NOTE — Assessment & Plan Note (Signed)
I have been concerned about the treatment of her cardiomyopathy. She is on reasonable medications. She has diffuse joint pains. I'm hesitant to adjust her meds any further. Her ejection fraction had been slowly improving up to 35% in August, 2014. When I see her next we will consider a follow-up echo.

## 2014-12-12 NOTE — Assessment & Plan Note (Signed)
The patient is very limited by her arthritic pain. She will be seeing rheumatology in March, 2016. She does try to limit the medication she uses for this. Most recently her renal function was good.

## 2014-12-12 NOTE — Patient Instructions (Signed)
Your physician recommends that you continue on your current medications as directed. Please refer to the Current Medication list given to you today.  Your physician recommends that you schedule a follow-up appointment in: 3 months  

## 2014-12-12 NOTE — Assessment & Plan Note (Signed)
The patient underwent bypass surgery in 2012. Unfortunately she has left ventricular dysfunction from her infarct. She's not having any significant ischemic symptoms.

## 2015-02-06 ENCOUNTER — Ambulatory Visit: Payer: Managed Care, Other (non HMO) | Admitting: Internal Medicine

## 2015-02-20 ENCOUNTER — Other Ambulatory Visit: Payer: Self-pay

## 2015-02-20 MED ORDER — LISINOPRIL 10 MG PO TABS: 10.0000 mg | ORAL_TABLET | Freq: Every day | ORAL | Status: DC

## 2015-03-13 ENCOUNTER — Inpatient Hospital Stay (HOSPITAL_COMMUNITY)
Admission: EM | Admit: 2015-03-13 | Discharge: 2015-03-20 | DRG: 292 | Disposition: A | Discharge status: Home / Self Care | Payer: BLUE CROSS/BLUE SHIELD | Attending: Internal Medicine | Admitting: Internal Medicine

## 2015-03-13 ENCOUNTER — Telehealth: Payer: Self-pay | Admitting: Cardiology

## 2015-03-13 ENCOUNTER — Encounter (HOSPITAL_COMMUNITY): Payer: Self-pay

## 2015-03-13 ENCOUNTER — Other Ambulatory Visit (HOSPITAL_COMMUNITY): Payer: Self-pay

## 2015-03-13 ENCOUNTER — Other Ambulatory Visit: Payer: Self-pay

## 2015-03-13 ENCOUNTER — Emergency Department (HOSPITAL_COMMUNITY): Payer: BLUE CROSS/BLUE SHIELD

## 2015-03-13 DIAGNOSIS — I5023 Acute on chronic systolic (congestive) heart failure: Secondary | ICD-10-CM | POA: Diagnosis not present

## 2015-03-13 DIAGNOSIS — E785 Hyperlipidemia, unspecified: Secondary | ICD-10-CM | POA: Diagnosis present

## 2015-03-13 DIAGNOSIS — I340 Nonrheumatic mitral (valve) insufficiency: Secondary | ICD-10-CM | POA: Diagnosis present

## 2015-03-13 DIAGNOSIS — E039 Hypothyroidism, unspecified: Secondary | ICD-10-CM | POA: Diagnosis present

## 2015-03-13 DIAGNOSIS — I5043 Acute on chronic combined systolic (congestive) and diastolic (congestive) heart failure: Secondary | ICD-10-CM | POA: Diagnosis not present

## 2015-03-13 DIAGNOSIS — I255 Ischemic cardiomyopathy: Secondary | ICD-10-CM | POA: Diagnosis present

## 2015-03-13 DIAGNOSIS — Z951 Presence of aortocoronary bypass graft: Secondary | ICD-10-CM | POA: Diagnosis not present

## 2015-03-13 DIAGNOSIS — I509 Heart failure, unspecified: Secondary | ICD-10-CM | POA: Diagnosis not present

## 2015-03-13 DIAGNOSIS — I5022 Chronic systolic (congestive) heart failure: Secondary | ICD-10-CM | POA: Diagnosis present

## 2015-03-13 DIAGNOSIS — R0789 Other chest pain: Secondary | ICD-10-CM | POA: Diagnosis present

## 2015-03-13 DIAGNOSIS — I272 Other secondary pulmonary hypertension: Secondary | ICD-10-CM | POA: Diagnosis present

## 2015-03-13 DIAGNOSIS — M159 Polyosteoarthritis, unspecified: Secondary | ICD-10-CM | POA: Diagnosis present

## 2015-03-13 DIAGNOSIS — IMO0002 Reserved for concepts with insufficient information to code with codable children: Secondary | ICD-10-CM | POA: Diagnosis present

## 2015-03-13 DIAGNOSIS — T500X5A Adverse effect of mineralocorticoids and their antagonists, initial encounter: Secondary | ICD-10-CM | POA: Diagnosis present

## 2015-03-13 DIAGNOSIS — I25709 Atherosclerosis of coronary artery bypass graft(s), unspecified, with unspecified angina pectoris: Secondary | ICD-10-CM | POA: Diagnosis not present

## 2015-03-13 DIAGNOSIS — I252 Old myocardial infarction: Secondary | ICD-10-CM | POA: Diagnosis not present

## 2015-03-13 DIAGNOSIS — Z87891 Personal history of nicotine dependence: Secondary | ICD-10-CM | POA: Diagnosis not present

## 2015-03-13 DIAGNOSIS — R079 Chest pain, unspecified: Secondary | ICD-10-CM

## 2015-03-13 DIAGNOSIS — E1140 Type 2 diabetes mellitus with diabetic neuropathy, unspecified: Secondary | ICD-10-CM | POA: Diagnosis not present

## 2015-03-13 DIAGNOSIS — R072 Precordial pain: Secondary | ICD-10-CM | POA: Diagnosis not present

## 2015-03-13 DIAGNOSIS — I10 Essential (primary) hypertension: Secondary | ICD-10-CM | POA: Diagnosis present

## 2015-03-13 DIAGNOSIS — E1165 Type 2 diabetes mellitus with hyperglycemia: Secondary | ICD-10-CM

## 2015-03-13 DIAGNOSIS — I2510 Atherosclerotic heart disease of native coronary artery without angina pectoris: Secondary | ICD-10-CM | POA: Diagnosis present

## 2015-03-13 DIAGNOSIS — Z6841 Body Mass Index (BMI) 40.0 and over, adult: Secondary | ICD-10-CM

## 2015-03-13 LAB — TROPONIN I: Troponin I: <0.03 ng/mL (ref <0.031)

## 2015-03-13 LAB — GLUCOSE, CAPILLARY
GLUCOSE-CAPILLARY: 256 mg/dL — AB (ref 70–99)
Glucose-Capillary: 204 mg/dL — ABNORMAL HIGH (ref 70–99)

## 2015-03-13 LAB — BASIC METABOLIC PANEL
Anion gap: 9 (ref 5–15)
BUN: 17 mg/dL (ref 6–20)
CALCIUM: 9.5 mg/dL (ref 8.9–10.3)
CHLORIDE: 102 mmol/L (ref 101–111)
CO2: 27 mmol/L (ref 22–32)
CREATININE: 0.98 mg/dL (ref 0.44–1.00)
GFR calc non Af Amer: >60 mL/min (ref >60)
Glucose, Bld: 230 mg/dL — ABNORMAL HIGH (ref 70–99)
Potassium: 4.5 mmol/L (ref 3.5–5.1)
Sodium: 138 mmol/L (ref 135–145)

## 2015-03-13 LAB — CBC
HCT: 40.1 % (ref 36.0–46.0)
HEMOGLOBIN: 13.2 g/dL (ref 12.0–15.0)
MCH: 29.3 pg (ref 26.0–34.0)
MCHC: 32.9 g/dL (ref 30.0–36.0)
MCV: 89.1 fL (ref 78.0–100.0)
Platelets: 203 10*3/uL (ref 150–400)
RBC: 4.5 MIL/uL (ref 3.87–5.11)
RDW: 15.2 % (ref 11.5–15.5)
WBC: 6.5 10*3/uL (ref 4.0–10.5)

## 2015-03-13 LAB — I-STAT TROPONIN, ED: TROPONIN I, POC: 0 ng/mL (ref 0.00–0.08)

## 2015-03-13 LAB — BRAIN NATRIURETIC PEPTIDE: B NATRIURETIC PEPTIDE 5: 750.3 pg/mL — AB (ref 0.0–100.0)

## 2015-03-13 MED ORDER — ASPIRIN 81 MG PO CHEW
81.0000 mg | CHEWABLE_TABLET | Freq: Every day | ORAL | Status: DC
  Administered 2015-03-14 – 2015-03-20 (×7): 81 mg via ORAL
  Filled 2015-03-13 (×8): qty 1

## 2015-03-13 MED ORDER — ACETAMINOPHEN 325 MG PO TABS
650.0000 mg | ORAL_TABLET | ORAL | Status: DC | PRN
  Administered 2015-03-15 – 2015-03-18 (×4): 650 mg via ORAL
  Filled 2015-03-13 (×4): qty 2

## 2015-03-13 MED ORDER — LISINOPRIL 10 MG PO TABS
10.0000 mg | ORAL_TABLET | Freq: Every day | ORAL | Status: DC
  Administered 2015-03-14 – 2015-03-18 (×5): 10 mg via ORAL
  Filled 2015-03-13 (×6): qty 1

## 2015-03-13 MED ORDER — FUROSEMIDE 10 MG/ML IJ SOLN
60.0000 mg | Freq: Once | INTRAMUSCULAR | Status: AC
  Administered 2015-03-13: 60 mg via INTRAVENOUS
  Filled 2015-03-13: qty 6

## 2015-03-13 MED ORDER — HEPARIN SODIUM (PORCINE) 5000 UNIT/ML IJ SOLN
5000.0000 [IU] | Freq: Three times a day (TID) | INTRAMUSCULAR | Status: DC
  Administered 2015-03-18 – 2015-03-19 (×2): 5000 [IU] via SUBCUTANEOUS
  Filled 2015-03-13 (×21): qty 1

## 2015-03-13 MED ORDER — FUROSEMIDE 10 MG/ML IJ SOLN
60.0000 mg | Freq: Once | INTRAMUSCULAR | Status: AC
  Administered 2015-03-13: 60 mg via INTRAVENOUS

## 2015-03-13 MED ORDER — NITROGLYCERIN 0.4 MG SL SUBL: 0.4000 mg | SUBLINGUAL_TABLET | SUBLINGUAL | Status: DC | PRN

## 2015-03-13 MED ORDER — CARVEDILOL 25 MG PO TABS
25.0000 mg | ORAL_TABLET | Freq: Two times a day (BID) | ORAL | Status: DC
  Administered 2015-03-13 – 2015-03-18 (×9): 25 mg via ORAL
  Filled 2015-03-13 (×12): qty 1

## 2015-03-13 MED ORDER — FUROSEMIDE 10 MG/ML IJ SOLN
120.0000 mg | Freq: Two times a day (BID) | INTRAVENOUS | Status: DC
  Administered 2015-03-13 – 2015-03-14 (×2): 120 mg via INTRAVENOUS
  Filled 2015-03-13 (×4): qty 12

## 2015-03-13 MED ORDER — ONDANSETRON HCL 4 MG/2ML IJ SOLN: 4.0000 mg | Freq: Four times a day (QID) | INTRAMUSCULAR | Status: DC | PRN

## 2015-03-13 MED ORDER — INSULIN ASPART 100 UNIT/ML ~~LOC~~ SOLN
0.0000 [IU] | Freq: Three times a day (TID) | SUBCUTANEOUS | Status: DC
  Administered 2015-03-13 – 2015-03-14 (×2): 7 [IU] via SUBCUTANEOUS
  Administered 2015-03-14 (×2): 4 [IU] via SUBCUTANEOUS
  Administered 2015-03-15: 3 [IU] via SUBCUTANEOUS
  Administered 2015-03-15 (×2): 4 [IU] via SUBCUTANEOUS
  Administered 2015-03-16: 7 [IU] via SUBCUTANEOUS
  Administered 2015-03-16 – 2015-03-17 (×2): 3 [IU] via SUBCUTANEOUS
  Administered 2015-03-17: 4 [IU] via SUBCUTANEOUS
  Administered 2015-03-17: 3 [IU] via SUBCUTANEOUS
  Administered 2015-03-18: 4 [IU] via SUBCUTANEOUS
  Administered 2015-03-18: 11 [IU] via SUBCUTANEOUS
  Administered 2015-03-18: 7 [IU] via SUBCUTANEOUS
  Administered 2015-03-19: 3 [IU] via SUBCUTANEOUS
  Administered 2015-03-19: 7 [IU] via SUBCUTANEOUS
  Administered 2015-03-19 – 2015-03-20 (×2): 3 [IU] via SUBCUTANEOUS
  Administered 2015-03-20: 6 [IU] via SUBCUTANEOUS
  Filled 2015-03-13: qty 0.2

## 2015-03-13 MED ORDER — ATORVASTATIN CALCIUM 10 MG PO TABS
10.0000 mg | ORAL_TABLET | Freq: Every day | ORAL | Status: DC
  Administered 2015-03-14 – 2015-03-20 (×7): 10 mg via ORAL
  Filled 2015-03-13 (×7): qty 1

## 2015-03-13 NOTE — ED Notes (Signed)
Pt was wheeled to bathroom and became very sob from getting out of the wheelchair into the bed.

## 2015-03-13 NOTE — ED Provider Notes (Signed)
CSN: [id number]     Arrival date & time 03/13/15  1275 History   First MD Initiated Contact with Patient 03/13/15 1105     Chief Complaint  Patient presents with  . Chest Pain     (Consider location/radiation/quality/duration/timing/severity/associated sxs/prior Treatment) Patient is a 64 y.o. female presenting with chest pain.  Chest Pain Pain location:  Substernal area Pain quality: pressure   Pain radiates to:  Neck and L arm Pain radiates to the back: no   Pain severity:  Severe Onset quality:  Gradual Duration:  2 weeks Timing:  Constant Progression:  Unchanged Chronicity:  Recurrent Context comment:  Prior MI with EF 35% 2 years ago Relieved by:  Nothing Worsened by:  Exertion Associated symptoms: shortness of breath   Associated symptoms: no abdominal pain, no nausea and not vomiting     Past Medical History  Diagnosis Date  . Obesity, morbid   . Chronic systolic heart failure     Chronic systolic CHF  . Hyperlipidemia 08/10/11  . Diabetes mellitus type 2 in obese 08/10/11  . CAD (coronary artery disease) 08/2011    NSTEMI with subsequent CABG 08/13/2011 with LIMA-LAD, SVG-diagonal, SVG-OM, SVG-PDA  . Ischemic cardiomyopathy 08/13/2011    EF 20% in 08/2011, still 25% 10/21/2011  . Pleural effusion     Requiring L thoracentesis 09/02/11  . Mitral regurgitation     Mild by echo 10/21/11  . Hypertension   . Pulmonary hypertension     Echo, September, 2013, 72 mmHg.  Marland Kitchen Ejection fraction < 50%     EF 20%, October, 2012  //  EF 25% December, 2012  //   EF 35%, echo, September, 2013  . Myocardial infarction 08/2011  . Orthopnoea     "progressively worse over last 3 wks" (03/17/2013)  . CHF (congestive heart failure)   . Carpal tunnel syndrome on both sides     "post OHS in 08/2011; resolved now" (03/17/2013)   Past Surgical History  Procedure Laterality Date  . Laparoscopy  1980's?    "removed gallstones; not gallbladder" (03/17/2013)  . Coronary artery bypass graft   08/13/11    CABG x4 with LIMA to LAD, SVG to Diag, SVG to OM, SVG to PDA, EVH via both thighs   Family History  Problem Relation Age of Onset  . Heart disease Father   . Cancer Neg Hx   . Diabetes Neg Hx   . Hypertension Neg Hx   . Kidney disease Neg Hx    History  Substance Use Topics  . Smoking status: Former Smoker    Types: Cigarettes    Quit date: 11/11/1978  . Smokeless tobacco: Never Used     Comment: 03/17/2013 "only a social smoker when I did smoke; ever bought any"  . Alcohol Use: Yes     Comment: 03/17/2013 "in the last 6 months I've had 1 glass of wine"   OB History    No data available     Review of Systems  Respiratory: Positive for shortness of breath.   Cardiovascular: Positive for chest pain.  Gastrointestinal: Negative for nausea, vomiting and abdominal pain.  All other systems reviewed and are negative.     Allergies  Review of patient's allergies indicates no known allergies.  Home Medications   Prior to Admission medications   Medication Sig Start Date End Date Taking? Authorizing Provider  acetaminophen (TYLENOL) 650 MG CR tablet Take 650 mg by mouth every 8 (eight) hours as needed for pain.  Yes Historical Provider, MD  amoxicillin (AMOXIL) 500 MG capsule Take 500 mg by mouth every 6 (six) hours as needed. Infection prevention 03/02/15  Yes Historical Provider, MD  aspirin 81 MG tablet Take 1 tablet (81 mg total) by mouth daily. 11/15/13  Yes Carlena Bjornstad, MD  atorvastatin (LIPITOR) 10 MG tablet Take 1 tablet (10 mg total) by mouth daily. 12/12/14  Yes Carlena Bjornstad, MD  Blood Glucose Monitoring Suppl (ONETOUCH VERIO) W/DEVICE KIT 1 Act by Does not apply route 3 (three) times daily. 10/12/14  Yes Janith Lima, MD  carvedilol (COREG) 25 MG tablet Take 1 tablet (25 mg total) by mouth 2 (two) times daily. 12/12/14  Yes Carlena Bjornstad, MD  glipiZIDE (GLUCOTROL) 10 MG tablet Take 1 tablet (10 mg total) by mouth 2 (two) times daily before a meal. 11/07/14  11/07/15 Yes Janith Lima, MD  glucose blood test strip Use TID 10/12/14  Yes Janith Lima, MD  lisinopril (PRINIVIL,ZESTRIL) 10 MG tablet Take 1 tablet (10 mg total) by mouth daily. 02/20/15  Yes Carlena Bjornstad, MD  torsemide (DEMADEX) 20 MG tablet Take 2 tablets (40 mg total) by mouth 2 (two) times daily. 11/09/14  Yes Carlena Bjornstad, MD   BP 98/54 mmHg  Pulse 60  Temp(Src) 97 F (36.1 C) (Oral)  Resp 18  Ht _0  (1.702 m)  Wt 293 lb 4.8 oz (133.04 kg)  BMI 45.93 kg/m2  SpO2 95% Physical Exam  Constitutional: She is oriented to person, place, and time. She appears well-developed and well-nourished.  HENT:  Head: Normocephalic and atraumatic.  Right Ear: External ear normal.  Left Ear: External ear normal.  Eyes: Conjunctivae and EOM are normal. Pupils are equal, round, and reactive to light.  Neck: Normal range of motion. Neck supple.  Cardiovascular: Normal rate, regular rhythm, normal heart sounds and intact distal pulses.   Pulmonary/Chest: Effort normal. She has rales in the right lower field and the left lower field.  Abdominal: Soft. Bowel sounds are normal. There is no tenderness.  Musculoskeletal: Normal range of motion.       Right lower leg: She exhibits edema.       Left lower leg: She exhibits edema.  Neurological: She is alert and oriented to person, place, and time.  Skin: Skin is warm and dry.  Vitals reviewed.   ED Course  Procedures (including critical care time) Labs Review Labs Reviewed  BASIC METABOLIC PANEL - Abnormal; Notable for the following:    Glucose, Bld 230 (*)    All other components within normal limits  BRAIN NATRIURETIC PEPTIDE - Abnormal; Notable for the following:    B Natriuretic Peptide 750.3 (*)    All other components within normal limits  BASIC METABOLIC PANEL - Abnormal; Notable for the following:    Chloride 98 (*)    Glucose, Bld 206 (*)    All other components within normal limits  HEMOGLOBIN A1C - Abnormal; Notable for  the following:    Hgb A1c MFr Bld 11.2 (*)    All other components within normal limits  GLUCOSE, CAPILLARY - Abnormal; Notable for the following:    Glucose-Capillary 204 (*)    All other components within normal limits  LIPID PANEL - Abnormal; Notable for the following:    HDL 26 (*)    LDL Cholesterol 126 (*)    All other components within normal limits  GLUCOSE, CAPILLARY - Abnormal; Notable for the following:  Glucose-Capillary 256 (*)    All other components within normal limits  GLUCOSE, CAPILLARY - Abnormal; Notable for the following:    Glucose-Capillary 198 (*)    All other components within normal limits  CBC  TROPONIN I  TROPONIN I  TROPONIN I  I-STAT TROPOININ, ED    Imaging Review Dg Chest 2 View  03/13/2015   CLINICAL DATA:  Chest pressure and shortness of breath for 2 weeks  EXAM: CHEST  2 VIEW  COMPARISON:  03/17/2013  FINDINGS: Cardiac shadow remains mildly enlarged. Postsurgical changes are seen. Mild central vascular congestion is noted. No focal infiltrate or sizable effusion is seen. No bony abnormality is noted.  IMPRESSION: Central vascular congestion consistent with mild CHF.   Electronically Signed   By: Inez Catalina M.D.   On: 03/13/2015 10:43     EKG Interpretation None      MDM   Final diagnoses:  None    64 y.o. female with pertinent PMH of CAD, CHF (last EF 35% 2014), DM, obesity presents with chest pain and exertional dyspnea as above.  Exam consistent with chf exacerbation. Wu consistent with same.  No ECG changes, trop negative.  Consulted cardiology for ACS ro and CHF exacerbation   I have reviewed all laboratory and imaging studies if ordered as above  1. Chest pain 2. CHF exacerbation    Debby Freiberg, MD 03/14/15 [phone]

## 2015-03-13 NOTE — Progress Notes (Signed)
Pt a/o, no c/o pain, pt oob ad lib, pt given heart failure booklet and zone tool reviewed with pt, VSS, pt stable

## 2015-03-13 NOTE — ED Notes (Signed)
Attempted to call report. Floor RN unable to accept report.  

## 2015-03-13 NOTE — Telephone Encounter (Signed)
Called patient. Someone answered phone. Indistinguishable noises and then loud "plunk" like phone dropped. This occurred the same way during two immediately following call attempts. Attempted to call Emergency Contacts: Christine Knight x2, lmtcb urgently and Christine Knight x2, line either busy or non-working. Called patient again. No answer at all. Called 911 immediately. They are willing to do Welfare Check and send out EMS, however we have no address on file, just [address] 911 had difficulty finding address, finally used [facility] address, sent unit out there however it was an old address from two years ago and patient not there. 911 called us back and we continued to search for a more recent address. Finally found one from 2015 - [address], [place], [place] [id number]. 911 sending unit and EMS to that address. Attempted to call Christine Knight again without success, lmtcb. Checked status of [facility] encounters. Patient has arrived to ED (apparently someone drove her there since she was not listed with EMS). Called 911 to let them know that patient had just arrived to ED.

## 2015-03-13 NOTE — H&P (Signed)
Patient ID: Christine Knight MRN: [id number], DOB/AGE: 08-06-1951   Admit date: 03/13/2015   Primary Physician: Scarlette Calico, MD Primary Cardiologist: Dr. Ron Parker (also seen Dr. Haroldine Laws in [facility] in 2014)  Pt. Profile:  morbidly obese 64 year old African-American female with has medical history of CAD s/p 4v CABG (LIMA to LAD, SVG to diagonal, SVG to OM, SVG to PDA) on 94/05/6545, chronic systolic heart failure with baseline EF around 35%, HTN, HLD, DM and pulmonary HTN present with dyspnea, orthopnea, PND and CP x 2 wks  Problem List  Past Medical History  Diagnosis Date  . Obesity, morbid   . Chronic systolic heart failure     Chronic systolic CHF  . Hyperlipidemia 08/10/11  . Diabetes mellitus type 2 in obese 08/10/11  . CAD (coronary artery disease) 08/2011    NSTEMI with subsequent CABG 08/13/2011 with LIMA-LAD, SVG-diagonal, SVG-OM, SVG-PDA  . Ischemic cardiomyopathy 08/13/2011    EF 20% in 08/2011, still 25% 10/21/2011  . Pleural effusion     Requiring L thoracentesis 09/02/11  . Mitral regurgitation     Mild by echo 10/21/11  . Hypertension   . Pulmonary hypertension     Echo, September, 2013, 72 mmHg.  Marland Kitchen Ejection fraction < 50%     EF 20%, October, 2012  //  EF 25% December, 2012  //   EF 35%, echo, September, 2013  . Myocardial infarction 08/2011  . Orthopnoea     "progressively worse over last 3 wks" (03/17/2013)  . CHF (congestive heart failure)   . Carpal tunnel syndrome on both sides     "post OHS in 08/2011; resolved now" (03/17/2013)    Past Surgical History  Procedure Laterality Date  . Laparoscopy  1980's?    "removed gallstones; not gallbladder" (03/17/2013)  . Coronary artery bypass graft  08/13/11    CABG x4 with LIMA to LAD, SVG to Diag, SVG to OM, SVG to PDA, EVH via both thighs     Allergies  No Known Allergies  HPI  The patient is a pleasant morbidly obese 64 year old African-American female with has medical history of CAD s/p 4v CABG (LIMA to  LAD, SVG to diagonal, SVG to OM, SVG to PDA) on 50/01/5464, chronic systolic heart failure with baseline EF around 35%, HTN, HLD, DM and pulmonary HTN. Patient's last cardiac catheterization was prior to her CABG. Surprisingly she states she did not have any chest discomfort prior to cardiac catheterization, however she did note a lot of "burping". Due to previous infarct, her EF in 2012 was 20% which improved to 35% in 2014. She was last admitted for heart failure in 2014, at which time he was seen by heart failure service. She has since been on carvedilol and lisinopril. She has not been able to tolerate spironolactone due to side effect of dizziness and hyperkalemia.   She has been compliant with her medications at home. She states she also has been very careful about fluid intake and sodium intake. She does not weigh herself on daily basis. For the past 2 weeks, she has been noticing increasing exertional chest discomfort. She described as a pressure-like sensation over the left substernal area that is worse with exertion and relieved with rest. Around the same time, she also noted worsening lower extremity edema, orthopnea and paroxysmal nocturnal dyspnea. She denies other exacerbating factors such as palpation, deep inspiration or body rotation. She has an intermittent cough recently, however denies any recent fever or chill.   Patient's shortness  of breath and paroxysmal nocturnal dyspnea worsened to the degree that she decided to seek medical attention at [facility] on 03/13/2015. Her BMET and CBC were normal. Troponin negative. BNP 750. Glucose 230. Chest x-ray shows central vascular congestion consistent with mild CHF. EKG had motion artifact and difficult to interpret, appears to be sinus rhythm, unable to assess T waves due to poor quality. Cardiology has been consulted for heart failure and chest pain.   Home Medications  Prior to Admission medications   Medication Sig Start Date End Date  Taking? Authorizing Provider  acetaminophen (TYLENOL) 650 MG CR tablet Take 650 mg by mouth every 8 (eight) hours as needed for pain.   Yes Historical Provider, MD  amoxicillin (AMOXIL) 500 MG capsule Take 500 mg by mouth every 6 (six) hours as needed. Infection prevention 03/02/15  Yes Historical Provider, MD  aspirin 81 MG tablet Take 1 tablet (81 mg total) by mouth daily. 11/15/13  Yes Carlena Bjornstad, MD  atorvastatin (LIPITOR) 10 MG tablet Take 1 tablet (10 mg total) by mouth daily. 12/12/14  Yes Carlena Bjornstad, MD  Blood Glucose Monitoring Suppl (ONETOUCH VERIO) W/DEVICE KIT 1 Act by Does not apply route 3 (three) times daily. 10/12/14  Yes Janith Lima, MD  carvedilol (COREG) 25 MG tablet Take 1 tablet (25 mg total) by mouth 2 (two) times daily. 12/12/14  Yes Carlena Bjornstad, MD  glipiZIDE (GLUCOTROL) 10 MG tablet Take 1 tablet (10 mg total) by mouth 2 (two) times daily before a meal. 11/07/14 11/07/15 Yes Janith Lima, MD  glucose blood test strip Use TID 10/12/14  Yes Janith Lima, MD  lisinopril (PRINIVIL,ZESTRIL) 10 MG tablet Take 1 tablet (10 mg total) by mouth daily. 02/20/15  Yes Carlena Bjornstad, MD  torsemide (DEMADEX) 20 MG tablet Take 2 tablets (40 mg total) by mouth 2 (two) times daily. 11/09/14  Yes Carlena Bjornstad, MD    Family History  Family History  Problem Relation Age of Onset  . Heart disease Father   . Cancer Neg Hx   . Diabetes Neg Hx   . Hypertension Neg Hx   . Kidney disease Neg Hx     Social History  History   Social History  . Marital Status: Divorced    Spouse Name: N/A  . Number of Children: N/A  . Years of Education: N/A   Occupational History  . Not on file.   Social History Main Topics  . Smoking status: Former Smoker    Types: Cigarettes    Quit date: 11/11/1978  . Smokeless tobacco: Never Used     Comment: 03/17/2013 "only a social smoker when I did smoke; ever bought any"  . Alcohol Use: Yes     Comment: 03/17/2013 "in the last 6 months I've had  1 glass of wine"  . Drug Use: No  . Sexual Activity: Not Currently    Birth Control/ Protection: Post-menopausal   Other Topics Concern  . Not on file   Social History Narrative     Review of Systems General:  No chills, fever, night sweats or weight changes.  Cardiovascular:  No palpitations +chest pain, dyspnea on exertion, edema, orthopnea, paroxysmal nocturnal dyspnea. Dermatological: No rash, lesions/masses Respiratory: +cough, dyspnea Urologic: No hematuria, dysuria Abdominal:   No nausea, vomiting, diarrhea, bright red blood per rectum, melena, or hematemesis Neurologic:  No visual changes, wkns, changes in mental status. All other systems reviewed and are otherwise negative except  as noted above.  Physical Exam  Blood pressure 120/67, pulse 58, temperature 98.4 F (36.9 C), temperature source Oral, resp. rate 17, height _0  (1.702 m), weight 291 lb 6 oz (132.167 kg), SpO2 89 %.  General: Pleasant, NAD Psych: Normal affect. Neuro: Alert and oriented X 3. Moves all extremities spontaneously. HEENT: Normal  Neck: Supple without bruits. JVD difficult to assess as neck full   Lungs:  Resp regular and unlabored. Markedly diminished breath sound, did not appreciate obvious rale, however difficult to assess given body habitus Heart: RRR no s3, s4, or murmurs. Abdomen: Soft, non-tender, non-distended, BS + x 4.  Extremities: No clubbing, cyanosis. DP/PT/Radials 2+ and equal bilaterally. 2+ edema in LE  Labs  Troponin (Point of Care Test)  Recent Labs  03/13/15 1135  TROPIPOC 0.00   No results for input(s): CKTOTAL, CKMB, TROPONINI in the last 72 hours. Lab Results  Component Value Date   WBC 6.5 03/13/2015   HGB 13.2 03/13/2015   HCT 40.1 03/13/2015   MCV 89.1 03/13/2015   PLT 203 03/13/2015    Recent Labs Lab 03/13/15 1135  NA 138  K 4.5  CL 102  CO2 27  BUN 17  CREATININE 0.98  CALCIUM 9.5  GLUCOSE 230*   Lab Results  Component Value Date   CHOL  148 10/10/2014   HDL 26.10* 10/10/2014   LDLCALC 105* 10/10/2014   TRIG 87.0 10/10/2014   No results found for: DDIMER   Radiology/Studies  Dg Chest 2 View  03/13/2015   CLINICAL DATA:  Chest pressure and shortness of breath for 2 weeks  EXAM: CHEST  2 VIEW  COMPARISON:  03/17/2013  FINDINGS: Cardiac shadow remains mildly enlarged. Postsurgical changes are seen. Mild central vascular congestion is noted. No focal infiltrate or sizable effusion is seen. No bony abnormality is noted.  IMPRESSION: Central vascular congestion consistent with mild CHF.   Electronically Signed   By: Inez Catalina M.D.   On: 03/13/2015 10:43    ECG  EKG had motion artifact and difficult to interpret, appears to be sinus rhythm, unable to assess T waves due to poor quality.  Echocardiogram  EF improved to 35% in August, 2014    ASSESSMENT AND PLAN  1. Acute on chronic HF  - obtain repeat echo, trend enzyme  - start IV lasix 182m BID  2. Chest pain, worse with exertion and relieved with rest  This is new for her  Prior to CABG felt like she had to belch    R/O MI  ONce diuresed would recomm R and L heart cath to define     3. CAD s/p CABG (LIMA to LAD, SVG to diagonal, SVG to OM, SVG to PDA)  As noted above    4. HTN  Follow   5. HLD  Continue statin   6. DM  7. pulmonary HTN  Signed, MAlmyra Deforest PA-C 03/13/2015, 2:43 PM  Patinet seen and examined  I have amended noted by HJanan Ridgeabove  to reflect my findings   Patient with history of CAD and chronic systolic CHF  Now with worsenting SOB and edema  And now CP which is new.  Concerining for ischemia. WIll plan to admit and diurese  ONce volume improved would recomm R and L heart cath to redefine anatomy and define pressures PDorris Carnes Will admit

## 2015-03-13 NOTE — Telephone Encounter (Signed)
New Message  Pt wanted to make out office/Dr. Myrtis SerKAtz aware that she is headed [facility]- per pt- she is very sick.

## 2015-03-13 NOTE — ED Notes (Signed)
Pt. Reports having lt. Side chest pain for 2 weeks.l  The pressure has been constant with twinges of pain. Pt. Reports having difficulty sleeping due to waking up gasping for air,  Pt. Cannot lay down flat.  Pt. Also reports that her lower face is numb, pt. Woke up those symptoms.  Chest pressure radiated to under her lt. Breast.  Bilateral lower extremity edema noted. GCS 15. Skin is was warm and dry.  No Neuro deficits noted

## 2015-03-13 NOTE — Telephone Encounter (Signed)
Patient admitted to [facility] with CHF exacerbation. Routed to Dr. Myrtis SerKatz.

## 2015-03-13 NOTE — ED Notes (Signed)
Pt O2 dropped to 87% after ambulating

## 2015-03-14 ENCOUNTER — Encounter (HOSPITAL_COMMUNITY): Payer: Self-pay | Admitting: General Practice

## 2015-03-14 LAB — GLUCOSE, CAPILLARY
GLUCOSE-CAPILLARY: 194 mg/dL — AB (ref 70–99)
Glucose-Capillary: 198 mg/dL — ABNORMAL HIGH (ref 70–99)
Glucose-Capillary: 217 mg/dL — ABNORMAL HIGH (ref 70–99)
Glucose-Capillary: 152 mg/dL — ABNORMAL HIGH (ref 70–99)

## 2015-03-14 LAB — HEMOGLOBIN A1C
HEMOGLOBIN A1C: 11.2 % — AB (ref 4.8–5.6)
Mean Plasma Glucose: 275 mg/dL

## 2015-03-14 LAB — BASIC METABOLIC PANEL
Anion gap: 9 (ref 5–15)
BUN: 19 mg/dL (ref 6–20)
CO2: 29 mmol/L (ref 22–32)
CREATININE: 0.84 mg/dL (ref 0.44–1.00)
Calcium: 8.9 mg/dL (ref 8.9–10.3)
Chloride: 98 mmol/L — ABNORMAL LOW (ref 101–111)
GFR calc non Af Amer: >60 mL/min (ref >60)
Glucose, Bld: 206 mg/dL — ABNORMAL HIGH (ref 70–99)
Potassium: 4 mmol/L (ref 3.5–5.1)
Sodium: 136 mmol/L (ref 135–145)

## 2015-03-14 LAB — LIPID PANEL
Cholesterol: 167 mg/dL (ref 0–200)
HDL: 26 mg/dL — ABNORMAL LOW (ref >40)
LDL Cholesterol: 126 mg/dL — ABNORMAL HIGH (ref 0–99)
Total CHOL/HDL Ratio: 6.4 RATIO
Triglycerides: 76 mg/dL (ref <150)
VLDL: 15 mg/dL (ref 0–40)

## 2015-03-14 LAB — TROPONIN I: Troponin I: <0.03 ng/mL (ref <0.031)

## 2015-03-14 MED ORDER — FUROSEMIDE 10 MG/ML IJ SOLN
8.0000 mg/h | INTRAVENOUS | Status: DC
  Administered 2015-03-14 – 2015-03-17 (×3): 8 mg/h via INTRAVENOUS
  Filled 2015-03-14 (×6): qty 25

## 2015-03-14 NOTE — Progress Notes (Signed)
Inpatient Diabetes Program Recommendations  AACE/ADA: New Consensus Statement on Inpatient Glycemic Control (2013)  Target Ranges:  Prepandial:   less than 140 mg/dL      Peak postprandial:   less than 180 mg/dL (1-2 hours)      Critically ill patients:  140 - 180 mg/dL    Inpatient Diabetes Program Recommendations Insulin - Basal: Glucose sustained in 200 range since admission. Resistant correction used tidwc. Pt would benefit from addition of basal lantus/levemir at 15-20 units to start (minimal starting dose based on wt in kg, at 0.2 units/kg, pt would require 26.6 units basl)  Thank you Lenor CoffinAnn Klaire Court, RN, MSN, CDE  Diabetes Inpatient Program Office: [phone] Pager: [phone] 8:00 am to 5:00 pm

## 2015-03-14 NOTE — Care Management Note (Signed)
Case Management Note  Patient Details  Name: Christine Knight MRN: [id number] Date of Birth: 10/16/1951  Subjective/Objective:    Pt admitted on 03/13/15 with CHF.  PTA, pt resides at home.  Will follow up with pt on home support.                  Action/Plan: Will follow for home needs as pt progresses.    Expected Discharge Date:  03/18/15               Expected Discharge Plan:  Home w Home Health Services  In-House Referral:     Discharge planning Services  CM Consult  Post Acute Care Choice:    Choice offered to:     DME Arranged:    DME Agency:     HH Arranged:    HH Agency:     Status of Service:  In process, will continue to follow  Medicare Important Message Given:    Date Medicare IM Given:    Medicare IM give by:    Date Additional Medicare IM Given:    Additional Medicare Important Message give by:     If discussed at Long Length of Stay Meetings, dates discussed:    Additional Comments:  Christine Knight, Christine Shillingford M, RN 03/14/2015, 4:12 PM Phone #[phone]

## 2015-03-14 NOTE — Progress Notes (Signed)
SUBJECTIVE:  Tearful, no acute SOB, still not improved much however.  Unable to be flat.     PHYSICAL EXAM Filed Vitals:   03/13/15 2036 03/14/15 0206 03/14/15 0558 03/14/15 1028  BP: 101/46 99/54 98/54  116/69  Pulse: 63 58 60 60  Temp: 96.7 F (35.9 C) 96.7 F (35.9 C) 97 F (36.1 C)   TempSrc: Oral Oral Oral   Resp: Height:      Weight:   293 lb 4.8 oz (133.04 kg)   SpO2: 97% 95% 95% 100%   General:  No acute distress Lungs:  Clear Heart:  RRR Abdomen:  Positive bowel sounds, no rebound no guarding\ Extremities:  Severe edema   LABS: Lab Results  Component Value Date   TROPONINI <0.03 03/14/2015   Results for orders placed or performed during the hospital encounter of 03/13/15 (from the past 24 hour(s))  Glucose, capillary     Status: Abnormal   Collection Time: 03/13/15  5:14 PM  Result Value Ref Range   Glucose-Capillary 204 (H) 70 - 99 mg/dL   Comment 1 Notify RN   Troponin I     Status: None   Collection Time: 03/13/15  6:38 PM  Result Value Ref Range   Troponin I <0.03 <0.031 ng/mL  Hemoglobin A1c     Status: Abnormal   Collection Time: 03/13/15  6:38 PM  Result Value Ref Range   Hgb A1c MFr Bld 11.2 (H) 4.8 - 5.6 %   Mean Plasma Glucose 275 mg/dL  Glucose, capillary     Status: Abnormal   Collection Time: 03/13/15  9:53 PM  Result Value Ref Range   Glucose-Capillary 256 (H) 70 - 99 mg/dL  Troponin I     Status: None   Collection Time: 03/13/15 10:33 PM  Result Value Ref Range   Troponin I <0.03 <0.031 ng/mL  Basic metabolic panel     Status: Abnormal   Collection Time: 03/14/15  5:24 AM  Result Value Ref Range   Sodium 136 135 - 145 mmol/L   Potassium 4.0 3.5 - 5.1 mmol/L   Chloride 98 (L) 101 - 111 mmol/L   CO2 29 22 - 32 mmol/L   Glucose, Bld 206 (H) 70 - 99 mg/dL   BUN 19 6 - 20 mg/dL   Creatinine, Ser 0.45 0.44 - 1.00 mg/dL   Calcium 8.9 8.9 - 40.9 mg/dL   GFR calc non Af Amer >60 >60 mL/min   GFR calc Af Amer >60 >60  mL/min   Anion gap 9 5 - 15  Troponin I     Status: None   Collection Time: 03/14/15  5:24 AM  Result Value Ref Range   Troponin I <0.03 <0.031 ng/mL  Lipid panel     Status: Abnormal   Collection Time: 03/14/15  5:24 AM  Result Value Ref Range   Cholesterol 167 0 - 200 mg/dL   Triglycerides 76 <811 mg/dL   HDL 26 (L) >91 mg/dL   Total CHOL/HDL Ratio 6.4 RATIO   VLDL 15 0 - 40 mg/dL   LDL Cholesterol 478 (H) 0 - 99 mg/dL  Glucose, capillary     Status: Abnormal   Collection Time: 03/14/15  6:13 AM  Result Value Ref Range   Glucose-Capillary 198 (H) 70 - 99 mg/dL  Glucose, capillary     Status: Abnormal   Collection Time: 03/14/15 11:35 AM  Result Value Ref Range   Glucose-Capillary 217 (H) 70 - 99  mg/dL    Intake/Output Summary (Last 24 hours) at 03/14/15 1211 Last data filed at 03/14/15 1029  Gross per 24 hour  Intake    762 ml  Output   1950 ml  Net  -1188 ml     ASSESSMENT AND PLAN:  ACUTE ON CHRONIC SYSTOLIC AND DIASTOLIC HF:  Continue IV diuresis.  I will change to continuous drip.    CHEST PAIN:  Enzymes negative .  Plan right and left heart cath   DM:  Continue current meds.   Christine FearingJames [facility] 03/14/2015 12:11 PM

## 2015-03-15 ENCOUNTER — Other Ambulatory Visit (HOSPITAL_COMMUNITY): Payer: Managed Care, Other (non HMO)

## 2015-03-15 ENCOUNTER — Ambulatory Visit: Payer: Managed Care, Other (non HMO) | Admitting: Cardiology

## 2015-03-15 ENCOUNTER — Inpatient Hospital Stay (HOSPITAL_COMMUNITY): Payer: BLUE CROSS/BLUE SHIELD

## 2015-03-15 DIAGNOSIS — I509 Heart failure, unspecified: Secondary | ICD-10-CM

## 2015-03-15 LAB — BASIC METABOLIC PANEL
Anion gap: 9 (ref 5–15)
BUN: 21 mg/dL — AB (ref 6–20)
CO2: 30 mmol/L (ref 22–32)
Calcium: 8.8 mg/dL — ABNORMAL LOW (ref 8.9–10.3)
Chloride: 96 mmol/L — ABNORMAL LOW (ref 101–111)
Creatinine, Ser: 1 mg/dL (ref 0.44–1.00)
GFR, EST NON AFRICAN AMERICAN: 59 mL/min — AB (ref >60)
GLUCOSE: 151 mg/dL — AB (ref 70–99)
POTASSIUM: 3.9 mmol/L (ref 3.5–5.1)
Sodium: 135 mmol/L (ref 135–145)

## 2015-03-15 LAB — GLUCOSE, CAPILLARY
GLUCOSE-CAPILLARY: 186 mg/dL — AB (ref 70–99)
Glucose-Capillary: 149 mg/dL — ABNORMAL HIGH (ref 70–99)
Glucose-Capillary: 187 mg/dL — ABNORMAL HIGH (ref 70–99)
Glucose-Capillary: 136 mg/dL — ABNORMAL HIGH (ref 70–99)

## 2015-03-15 MED ORDER — INSULIN GLARGINE 100 UNIT/ML ~~LOC~~ SOLN
15.0000 [IU] | Freq: Every day | SUBCUTANEOUS | Status: DC
  Administered 2015-03-15 – 2015-03-20 (×6): 15 [IU] via SUBCUTANEOUS
  Filled 2015-03-15 (×6): qty 0.15

## 2015-03-15 NOTE — Progress Notes (Signed)
Asked Dr. Antoine PocheHochrein, who was on floor and just saw pt, if any  medications should be held due to pt BP of 87/56. Dr. Antoine PocheHochrein stated to give pt all medicines and do not hold anything.Will continue to monitor pt.

## 2015-03-15 NOTE — Progress Notes (Signed)
    SUBJECTIVE:  Breathing somewhat better.  No pain   PHYSICAL EXAM Filed Vitals:   03/14/15 1359 03/14/15 2018 03/15/15 0149 03/15/15 0522  BP: 98/41 96/40 110/79 116/66  Pulse: 66 57 59 59  Temp: 98.4 F (36.9 C) 98.6 F (37 C) 98.3 F (36.8 C) 98.2 F (36.8 C)  TempSrc: Oral Oral Oral Oral  Resp: 18 18 22 22   Height:      Weight:    289 lb 12.8 oz (131.452 kg)  SpO2: 99% 92% 93% 93%   General:  No acute distress Lungs:  Clear Heart:  RRR Abdomen:  Positive bowel sounds, no rebound no guarding\ Extremities:  Severe edema slightly improved.   LABS: Lab Results  Component Value Date   TROPONINI <0.03 03/14/2015   Results for orders placed or performed during the hospital encounter of 03/13/15 (from the past 24 hour(s))  Glucose, capillary     Status: Abnormal   Collection Time: 03/14/15 11:35 AM  Result Value Ref Range   Glucose-Capillary 217 (H) 70 - 99 mg/dL  Glucose, capillary     Status: Abnormal   Collection Time: 03/14/15  4:02 PM  Result Value Ref Range   Glucose-Capillary 194 (H) 70 - 99 mg/dL  Glucose, capillary     Status: Abnormal   Collection Time: 03/14/15  8:42 PM  Result Value Ref Range   Glucose-Capillary 152 (H) 70 - 99 mg/dL   Comment 1 Notify RN    Comment 2 Document in Chart   Basic metabolic panel     Status: Abnormal   Collection Time: 03/15/15  3:41 AM  Result Value Ref Range   Sodium 135 135 - 145 mmol/L   Potassium 3.9 3.5 - 5.1 mmol/L   Chloride 96 (L) 101 - 111 mmol/L   CO2 30 22 - 32 mmol/L   Glucose, Bld 151 (H) 70 - 99 mg/dL   BUN 21 (H) 6 - 20 mg/dL   Creatinine, Ser 6.571.00 0.44 - 1.00 mg/dL   Calcium 8.8 (L) 8.9 - 10.3 mg/dL   GFR calc non Af Amer 59 (L) >60 mL/min   GFR calc Af Amer >60 >60 mL/min   Anion gap 9 5 - 15  Glucose, capillary     Status: Abnormal   Collection Time: 03/15/15  6:18 AM  Result Value Ref Range   Glucose-Capillary 149 (H) 70 - 99 mg/dL    Intake/Output Summary (Last 24 hours) at 03/15/15  [id number] Last data filed at 03/15/15 0600  Gross per 24 hour  Intake 1216.27 ml  Output   1975 ml  Net -758.73 ml     ASSESSMENT AND PLAN:  ACUTE ON CHRONIC SYSTOLIC AND DIASTOLIC HF:  Continue IV diuresis.  Started IV drip yesterday.  I will increase this.    CHEST PAIN:  Enzymes negative .  Plan right and left heart cath   DM:  I will start lantus per diabetes management.  I am thankful for their advice.  A1c is greater than 11.   Christine Knight 03/15/2015 9:22 AM

## 2015-03-15 NOTE — Progress Notes (Signed)
  Echocardiogram 2D Echocardiogram has been performed.  Arvil ChacoFoster, Anvita Hirata 03/15/2015, 3:01 PM

## 2015-03-16 ENCOUNTER — Other Ambulatory Visit: Payer: Self-pay

## 2015-03-16 DIAGNOSIS — R072 Precordial pain: Secondary | ICD-10-CM

## 2015-03-16 LAB — PROTIME-INR
INR: 1.28 (ref 0.00–1.49)
PROTHROMBIN TIME: 16.1 s — AB (ref 11.6–15.2)

## 2015-03-16 LAB — BASIC METABOLIC PANEL
Anion gap: 10 (ref 5–15)
BUN: 23 mg/dL — AB (ref 6–20)
CO2: 28 mmol/L (ref 22–32)
CREATININE: 0.98 mg/dL (ref 0.44–1.00)
Calcium: 9 mg/dL (ref 8.9–10.3)
Chloride: 99 mmol/L — ABNORMAL LOW (ref 101–111)
GFR calc Af Amer: >60 mL/min (ref >60)
GLUCOSE: 127 mg/dL — AB (ref 70–99)
Potassium: 4 mmol/L (ref 3.5–5.1)
Sodium: 137 mmol/L (ref 135–145)

## 2015-03-16 LAB — GLUCOSE, CAPILLARY
GLUCOSE-CAPILLARY: 112 mg/dL — AB (ref 70–99)
GLUCOSE-CAPILLARY: 133 mg/dL — AB (ref 70–99)
Glucose-Capillary: 148 mg/dL — ABNORMAL HIGH (ref 70–99)
Glucose-Capillary: 208 mg/dL — ABNORMAL HIGH (ref 70–99)

## 2015-03-16 MED ORDER — SODIUM CHLORIDE 0.9 % IV SOLN: 250.0000 mL | INTRAVENOUS | Status: DC | PRN

## 2015-03-16 MED ORDER — LIVING WELL WITH DIABETES BOOK
Freq: Once | Status: AC
  Administered 2015-03-16: 1
  Filled 2015-03-16: qty 1

## 2015-03-16 MED ORDER — SODIUM CHLORIDE 0.9 % IV SOLN
INTRAVENOUS | Status: DC
Start: 2015-03-16 — End: 2015-03-20
  Administered 2015-03-16 – 2015-03-17 (×2): via INTRAVENOUS

## 2015-03-16 MED ORDER — SODIUM CHLORIDE 0.9 % IJ SOLN
3.0000 mL | Freq: Two times a day (BID) | INTRAMUSCULAR | Status: DC
  Administered 2015-03-16 – 2015-03-19 (×7): 3 mL via INTRAVENOUS

## 2015-03-16 MED ORDER — SODIUM CHLORIDE 0.9 % IJ SOLN: 3.0000 mL | INTRAMUSCULAR | Status: DC | PRN

## 2015-03-16 MED ORDER — INSULIN STARTER KIT- PEN NEEDLES (ENGLISH)
1.0000 | Freq: Once | Status: AC
  Administered 2015-03-16: 1
  Filled 2015-03-16: qty 1

## 2015-03-16 NOTE — Progress Notes (Signed)
    SUBJECTIVE:  Breathing better.  No pain   PHYSICAL EXAM Filed Vitals:   03/15/15 1744 03/15/15 2106 03/15/15 2205 03/16/15 0628  BP: 110/49 98/50 110/73 103/53  Pulse: 56 58 60 54  Temp: 98.6 F (37 C) 98.6 F (37 C)  97.6 F (36.4 C)  TempSrc: Oral Oral  Oral  Resp: 20 18  19   Height:      Weight:    247 lb 8 oz (112.265 kg)  SpO2: 93% 100%  99%   General:  No acute distress Lungs:  Clear Heart:  RRR Abdomen:  Positive bowel sounds, no rebound no guarding Extremities:  Severe edema slightly improved.  Neuro:  Nonfocal  LABS: Lab Results  Component Value Date   TROPONINI <0.03 03/14/2015   Results for orders placed or performed during the hospital encounter of 03/13/15 (from the past 24 hour(s))  Glucose, capillary     Status: Abnormal   Collection Time: 03/15/15 11:29 AM  Result Value Ref Range   Glucose-Capillary 187 (H) 70 - 99 mg/dL  Glucose, capillary     Status: Abnormal   Collection Time: 03/15/15  4:29 PM  Result Value Ref Range   Glucose-Capillary 186 (H) 70 - 99 mg/dL   Comment 1 Notify RN   Glucose, capillary     Status: Abnormal   Collection Time: 03/15/15 10:52 PM  Result Value Ref Range   Glucose-Capillary 136 (H) 70 - 99 mg/dL  Basic metabolic panel     Status: Abnormal   Collection Time: 03/16/15  3:29 AM  Result Value Ref Range   Sodium 137 135 - 145 mmol/L   Potassium 4.0 3.5 - 5.1 mmol/L   Chloride 99 (L) 101 - 111 mmol/L   CO2 28 22 - 32 mmol/L   Glucose, Bld 127 (H) 70 - 99 mg/dL   BUN 23 (H) 6 - 20 mg/dL   Creatinine, Ser 0.980.98 0.44 - 1.00 mg/dL   Calcium 9.0 8.9 - 11.910.3 mg/dL   GFR calc non Af Amer >60 >60 mL/min   GFR calc Af Amer >60 >60 mL/min   Anion gap 10 5 - 15  Glucose, capillary     Status: Abnormal   Collection Time: 03/16/15  6:46 AM  Result Value Ref Range   Glucose-Capillary 112 (H) 70 - 99 mg/dL    Intake/Output Summary (Last 24 hours) at 03/16/15 0851 Last data filed at 03/16/15 0827  Gross per 24 hour    Intake 1562.53 ml  Output   2675 ml  Net -1112.47 ml     ASSESSMENT AND PLAN:  ACUTE ON CHRONIC SYSTOLIC AND DIASTOLIC HF:  Continue IV diuresis.  Started IV drip two days ago.  I will continue this.   CHEST PAIN:  Enzymes negative .  Plan right and left heart cath for the AM.  The patient understands that risks included but are not limited to stroke (1 in 1000), death (1 in 1000), kidney failure [usually temporary] (1 in 500), bleeding (1 in 200), allergic reaction [possibly serious] (1 in 200).  The patient understands and agrees to proceed.   DM:  I was started on lantus.  A1c is greater than 11.   Rollene RotundaJames Brylon Brenning 03/16/2015 8:51 AM

## 2015-03-16 NOTE — Progress Notes (Signed)
Inpatient Diabetes Program Recommendations  AACE/ADA: New Consensus Statement on Inpatient Glycemic Control (2013)  Target Ranges:  Prepandial:   less than 140 mg/dL      Peak postprandial:   less than 180 mg/dL (1-2 hours)      Critically ill patients:  140 - 180 mg/dL    Inpatient Diabetes Program Recommendations Insulin - Basal: Glucose improved with addition of basal lantus. Pt most probably needs basal insulin at discharge. Ordered insulin pen starter kit and RN assist with instuction on administeriing insulin. HgbA1C: 11.2% Diet: Ordered dietiician consult   Ordered other teaching materials including system network ed'l videos, RD consult for diet review for both cardiac and diabetes, and ed'l manual from pharmacy. Will instruct on insulin pen use if to be discharged home on insulin. For cardiac cath tomorrow am, thus will wait to do this teaching after procedure.   Thank you Rosita Kea, RN, MSN, CDE  Diabetes Inpatient Program Office: [phone] Pager: [phone] 8:00 am to 5:00 pm

## 2015-03-17 ENCOUNTER — Encounter (HOSPITAL_COMMUNITY)
Admission: EM | Disposition: A | Discharge status: Home / Self Care | Payer: Self-pay | Source: Home / Self Care | Attending: Internal Medicine

## 2015-03-17 DIAGNOSIS — I272 Other secondary pulmonary hypertension: Secondary | ICD-10-CM

## 2015-03-17 DIAGNOSIS — I255 Ischemic cardiomyopathy: Secondary | ICD-10-CM

## 2015-03-17 LAB — GLUCOSE, CAPILLARY
Glucose-Capillary: 132 mg/dL — ABNORMAL HIGH (ref 70–99)
Glucose-Capillary: 125 mg/dL — ABNORMAL HIGH (ref 70–99)
Glucose-Capillary: 187 mg/dL — ABNORMAL HIGH (ref 70–99)
Glucose-Capillary: 188 mg/dL — ABNORMAL HIGH (ref 70–99)

## 2015-03-17 LAB — BASIC METABOLIC PANEL
Anion gap: 9 (ref 5–15)
BUN: 22 mg/dL — ABNORMAL HIGH (ref 6–20)
CHLORIDE: 100 mmol/L — AB (ref 101–111)
CO2: 28 mmol/L (ref 22–32)
Calcium: 9.3 mg/dL (ref 8.9–10.3)
Creatinine, Ser: 0.91 mg/dL (ref 0.44–1.00)
GFR calc non Af Amer: >60 mL/min (ref >60)
Glucose, Bld: 129 mg/dL — ABNORMAL HIGH (ref 70–99)
Potassium: 3.5 mmol/L (ref 3.5–5.1)
SODIUM: 137 mmol/L (ref 135–145)

## 2015-03-17 SURGERY — RIGHT/LEFT HEART CATH AND CORONARY/GRAFT ANGIOGRAPHY
Anesthesia: LOCAL

## 2015-03-17 NOTE — Progress Notes (Signed)
  RD consulted for nutrition education regarding diabetics and heart healthy diet.   Lab Results  Component Value Date   HGBA1C 11.2* 03/13/2015   Pt states that due to her arthritis she has been primarily sedentary for over one year causing elevated glucose levels. Reviewed patient's dietary recall. Provided examples on ways to decrease sodium intake and carbohydrate intake in diet. Discouraged intake of processed foods and use of salt shaker. Encouraged fresh fruits and vegetables as well as whole grain sources of carbohydrates to maximize fiber intake. Encouraged intake of lean protein and heart-healthy fats.   RD provided "Carbohydrate Counting for People with Diabetes" and "Low Sodium Nutrition Therapy"  handout from the Academy of Nutrition and Dietetics. Provided list of carbohydrates and recommended serving sizes of common foods. Encouraged pt to aim for 3-5 servings of carbohydrates foods per meal.   Teach back method used.  Expect good compliance.  Body mass index is 40.77 kg/(m^2). Pt meets criteria for Morbid Obesity based on current BMI.  Current diet order is Heart Healthy/Carb Modified, patient is consuming approximately 100% of meals at this time. Labs and medications reviewed. No further nutrition interventions warranted at this time. RD contact information provided. If additional nutrition issues arise, please re-consult RD.  Ian Malkineanne Barnett RD, LDN Inpatient Clinical Dietitian Pager: [phone] After Hours Pager: [phone]

## 2015-03-17 NOTE — Progress Notes (Signed)
Patient Name: Christine Knight Date of Encounter: 03/17/2015     Active Problems:   Acute on chronic systolic heart failure    SUBJECTIVE  No CP. SOB much improved from admission. No more orthopnea. Still with LE edema. L>R which is unual for her. She does not think she is at her baseline volume status. She has refused L/RHC scheduled for this AM. SHe was up all night worried about it. Her family could not be here for the procedure, which was unacceptable to her. She was "okay with them just looking but does not want any stents."  CURRENT MEDS . aspirin  81 mg Oral Daily  . atorvastatin  10 mg Oral Daily  . carvedilol  25 mg Oral BID  . heparin  5,000 Units Subcutaneous 3 times per day  . insulin aspart  0-20 Units Subcutaneous TID WC  . insulin glargine  15 Units Subcutaneous Daily  . lisinopril  10 mg Oral Daily  . sodium chloride  3 mL Intravenous Q12H    OBJECTIVE  Filed Vitals:   03/16/15 1045 03/16/15 1412 03/16/15 2006 03/17/15 0600  BP: 106/59 94/50 101/70 105/62  Pulse: 46 53 55 55  Temp:  97.7 F (36.5 C) 98 F (36.7 C) 98.4 F (36.9 C)  TempSrc:  Oral Oral Oral  Resp:  18 18 20   Height:      Weight:    260 lb 5.8 oz (118.1 kg)  SpO2:  91% 97% 98%    Intake/Output Summary (Last 24 hours) at 03/17/15 1031 Last data filed at 03/17/15 0914  Gross per 24 hour  Intake 1166.47 ml  Output   2375 ml  Net -1208.53 ml   Filed Weights   03/15/15 0522 03/16/15 0628 03/17/15 0600  Weight: 289 lb 12.8 oz (131.452 kg) 247 lb 8 oz (112.265 kg) 260 lb 5.8 oz (118.1 kg)    PHYSICAL EXAM  General: Pleasant, NAD. obese Neuro: Alert and oriented X 3. Moves all extremities spontaneously. Psych: Normal affect. HEENT:  Normal  Neck: Supple without bruits or JVD. Lungs:  Resp regular and unlabored, CTA. Mild diffuse wheezing Heart: RRR no s3, s4, or murmurs. Abdomen: Soft, non-tender, non-distended, BS + x 4.  Extremities: No clubbing, cyanosis. DP/PT/Radials 2+ and  equal bilaterally. BIlateral LE edema  L>R  Accessory Clinical Findings   Basic Metabolic Panel  Recent Labs  03/15/15 0341 03/16/15 0329  NA 135 137  K 3.9 4.0  CL 96* 99*  CO2 30 28  GLUCOSE 151* 127*  BUN 21* 23*  CREATININE 1.00 0.98  CALCIUM 8.8* 9.0    TELE  NSR with some PVCs  Radiology/Studies  Dg Chest 2 View  03/13/2015   CLINICAL DATA:  Chest pressure and shortness of breath for 2 weeks  EXAM: CHEST  2 VIEW  COMPARISON:  03/17/2013  FINDINGS: Cardiac shadow remains mildly enlarged. Postsurgical changes are seen. Mild central vascular congestion is noted. No focal infiltrate or sizable effusion is seen. No bony abnormality is noted.  IMPRESSION: Central vascular congestion consistent with mild CHF.   Electronically Signed   By: Alcide CleverMark  Lukens M.D.   On: 03/13/2015 10:43    2D ECHO: 03/15/2015 LV EF: 30% -   35% Study Conclusions - Left ventricle: The cavity size was mildly dilated. Systolic   function was moderately to severely reduced. The estimated   ejection fraction was in the range of 30% to 35%. Diffuse   hypokinesis. There is akinesis of the entire inferolateral  myocardium. Other myocardial segments are not adequately   visualized. Recommend definity contrast study. There was a   reduced contribution of atrial contraction to ventricular   filling, due to increased ventricular diastolic pressure or   atrial contractile dysfunction. Doppler parameters are consistent   with a reversible restrictive pattern, indicative of decreased   left ventricular diastolic compliance and/or increased left   atrial pressure (grade 3 diastolic dysfunction). Doppler   parameters are consistent with high ventricular filling pressure. - Aortic valve: Trileaflet; normal thickness, mildly calcified   leaflets. - Mitral valve: Poorly visualized. - Right ventricle: Poorly visualized. The cavity size was   moderately dilated. Wall thickness was normal. Systolic function   was  severely reduced. - Right atrium: The atrium was severely dilated. - Tricuspid valve: There was moderate regurgitation. - Pulmonic valve: There was mild regurgitation. - Pulmonary arteries: PA peak pressure: 81 mm Hg (S). Impressions: - The right ventricular systolic pressure was increased consistent   with severe pulmonary hypertension.   ASSESSMENT AND PLAN  This is a morbidly obese 64 year old African-American female with has medical history of CAD s/p 4v CABG (LIMA to LAD, SVG to diagonal, SVG to OM, SVG to PDA) on 08/13/2011, chronic systolic heart failure with baseline EF around 35%, HTN, HLD, DM and pulmonary HTN who [facility] with dyspnea, orthopnea, PND and CP x 2 wks.  ACUTE ON CHRONIC SYSTOLIC AND DIASTOLIC HF/ Severe pulmonary HTN  -- Placed on IV lasix which was converted to a lasix gtt on 03/14/15. Net neg 4.6L and weight down 31 lbs ( 291--> 260 lbs). Still with LE edema but breathing much better. Not sure what her dry weight is ( she was 280lbs at last office visit in 12/2014) -- Repeat 2D ECHO 03/15/15 with EF 30-35%, diffuse HK, akinesis of entire inferolat myocardium, G3DD, RV systolic function severely reduced, RA severely dilated, mod TR, mild PR, RV systolic pressure increased c/w severe pulmonary HTN ( PA pk pressure 81mm Hg). Pulmonary HTN new since last 2D ECHO -- She was set up for [facility]/RHC this morning but refused.  -- Continue carvedilol 25mg  BID and lisinopril 10mg  qd. She has not been able to tolerate spironolactone due to side effect of dizziness and hyperkalemia  CAD s/p CABG/Chest pain: Enzymes negative, but no serial enzymes. It was planned for [facility]/RHC today, but now patient refusing because she was up all night worried and does not want any stents placed (she was okay with them only looking but does not want anymore interventions)  DM: A1c is greater than 11. She was started on Lantus. Diabetes coordinator assistance appreciated.   HLD- LDL above  goal at 126. Consider up titrating her statin   LE edema- L>R . She usually does not have unequal swelling. No calf pain. Consider LE dopplers to rule out DVT.   Dispo- patient eager to go home. I do not think she is ready for discharge. SHe is still on a lasix gtt with some evidence of volume overload. She was supposed to have a L/RHC heart cath today but refused. She does not want any interventions ( stents placed.) she does have new significant pulmonary HTN and difficult to tell what her dry weight is so she may still benefit from a diagnostic RHC. This would have to be put off until Monday due to scheduling. Dr. Royann Shiversroitoru to follow.   Billy FischerSigned, THOMPSON, KATHRYN R PA-C  Pager [phone]  She was very upset when consent paper listed "  possible stent" - she does not want one. Cath canceled today, especially since her family could not come today and she wants them here for the procedure.. Agree she would benefit from further diuresis and from right heart catheterization. This could help establish whether it is due entirely to LV failure or also intrinsic lung disease. She is still reluctant, but may agree to have a diagnostic only procedure next week.Thurmon Fair, MD, [facility] [phone] office [phone] pager

## 2015-03-17 NOTE — Progress Notes (Signed)
As ordered, pt was given the "Living with Diabetes" handbook.  She was also given the insulin pen and instruction manual.  Following is a list of the educational videos that she watched: "Monitoring Glucose" "Managing Diabetes" "How to Use Insulin Pen"

## 2015-03-18 LAB — BASIC METABOLIC PANEL
ANION GAP: 9 (ref 5–15)
BUN: 27 mg/dL — ABNORMAL HIGH (ref 6–20)
CHLORIDE: 98 mmol/L — AB (ref 101–111)
CO2: 28 mmol/L (ref 22–32)
Calcium: 9 mg/dL (ref 8.9–10.3)
Creatinine, Ser: 0.94 mg/dL (ref 0.44–1.00)
GFR calc non Af Amer: >60 mL/min (ref >60)
Glucose, Bld: 156 mg/dL — ABNORMAL HIGH (ref 70–99)
POTASSIUM: 4 mmol/L (ref 3.5–5.1)
SODIUM: 135 mmol/L (ref 135–145)

## 2015-03-18 LAB — GLUCOSE, CAPILLARY
GLUCOSE-CAPILLARY: 173 mg/dL — AB (ref 70–99)
GLUCOSE-CAPILLARY: 222 mg/dL — AB (ref 70–99)
Glucose-Capillary: 178 mg/dL — ABNORMAL HIGH (ref 70–99)

## 2015-03-18 MED ORDER — CARVEDILOL 12.5 MG PO TABS
12.5000 mg | ORAL_TABLET | Freq: Two times a day (BID) | ORAL | Status: DC
  Administered 2015-03-18: 12.5 mg via ORAL
  Filled 2015-03-18 (×3): qty 1

## 2015-03-18 MED ORDER — FUROSEMIDE 10 MG/ML IJ SOLN
120.0000 mg | Freq: Two times a day (BID) | INTRAVENOUS | Status: DC
  Administered 2015-03-18: 120 mg via INTRAVENOUS
  Filled 2015-03-18 (×4): qty 12

## 2015-03-18 NOTE — Progress Notes (Signed)
Patient Name: Christine BabeBarbara Knight Date of Encounter: 03/18/2015     Active Problems:   Acute on chronic systolic heart failure    SUBJECTIVE  Dyspnea improving; no chest pain  CURRENT MEDS . aspirin  81 mg Oral Daily  . atorvastatin  10 mg Oral Daily  . carvedilol  25 mg Oral BID  . heparin  5,000 Units Subcutaneous 3 times per day  . insulin aspart  0-20 Units Subcutaneous TID WC  . insulin glargine  15 Units Subcutaneous Daily  . lisinopril  10 mg Oral Daily  . sodium chloride  3 mL Intravenous Q12H    OBJECTIVE  Filed Vitals:   03/17/15 1048 03/17/15 1413 03/17/15 2206 03/18/15 0459  BP: 120/64 92/54 108/53 102/60  Pulse: 59 51 61 56  Temp:  98.3 F (36.8 C) 98.2 F (36.8 C) 98 F (36.7 C)  TempSrc:  Oral Oral Oral  Resp:  20 20 20   Height:      Weight:    256 lb 9.6 oz (116.393 kg)  SpO2:  94% 95% 99%    Intake/Output Summary (Last 24 hours) at 03/18/15 1030 Last data filed at 03/18/15 0921  Gross per 24 hour  Intake 1548.34 ml  Output   3775 ml  Net -2226.66 ml   Filed Weights   03/16/15 0628 03/17/15 0600 03/18/15 0459  Weight: 247 lb 8 oz (112.265 kg) 260 lb 5.8 oz (118.1 kg) 256 lb 9.6 oz (116.393 kg)    PHYSICAL EXAM  General: Pleasant, NAD. obese Neuro: Alert and oriented X 3. Moves all extremities spontaneously. HEENT:  Normal  Neck: Supple  Lungs:  CTA Heart: RRR  Abdomen: Soft, non-tender, non-distended Extremities: trace to 1+ edema  Accessory Clinical Findings   Basic Metabolic Panel  Recent Labs  03/17/15 1159 03/18/15 0345  NA 137 135  K 3.5 4.0  CL 100* 98*  CO2 28 28  GLUCOSE 129* 156*  BUN 22* 27*  CREATININE 0.91 0.94  CALCIUM 9.3 9.0    TELE  Sinus  Radiology/Studies  Dg Chest 2 View  03/13/2015   CLINICAL DATA:  Chest pressure and shortness of breath for 2 weeks  EXAM: CHEST  2 VIEW  COMPARISON:  03/17/2013  FINDINGS: Cardiac shadow remains mildly enlarged. Postsurgical changes are seen. Mild central  vascular congestion is noted. No focal infiltrate or sizable effusion is seen. No bony abnormality is noted.  IMPRESSION: Central vascular congestion consistent with mild CHF.   Electronically Signed   By: Alcide CleverMark  Lukens M.D.   On: 03/13/2015 10:43    2D ECHO: 03/15/2015 LV EF: 30% -   35% Study Conclusions - Left ventricle: The cavity size was mildly dilated. Systolic   function was moderately to severely reduced. The estimated   ejection fraction was in the range of 30% to 35%. Diffuse   hypokinesis. There is akinesis of the entire inferolateral   myocardium. Other myocardial segments are not adequately   visualized. Recommend definity contrast study. There was a   reduced contribution of atrial contraction to ventricular   filling, due to increased ventricular diastolic pressure or   atrial contractile dysfunction. Doppler parameters are consistent   with a reversible restrictive pattern, indicative of decreased   left ventricular diastolic compliance and/or increased left   atrial pressure (grade 3 diastolic dysfunction). Doppler   parameters are consistent with high ventricular filling pressure. - Aortic valve: Trileaflet; normal thickness, mildly calcified   leaflets. - Mitral valve: Poorly visualized. - Right  ventricle: Poorly visualized. The cavity size was   moderately dilated. Wall thickness was normal. Systolic function   was severely reduced. - Right atrium: The atrium was severely dilated. - Tricuspid valve: There was moderate regurgitation. - Pulmonic valve: There was mild regurgitation. - Pulmonary arteries: PA peak pressure: 81 mm Hg (S). Impressions: - The right ventricular systolic pressure was increased consistent   with severe pulmonary hypertension.   ASSESSMENT AND PLAN  This is a morbidly obese 64 year old African-American female with has medical history of CAD s/p 4v CABG (LIMA to LAD, SVG to diagonal, SVG to OM, SVG to PDA) on 08/13/2011, chronic systolic  heart failure with baseline EF around 35%, HTN, HLD, DM and pulmonary HTN who [facility] with dyspnea, orthopnea, PND and CP x 2 wks.  ACUTE ON CHRONIC SYSTOLIC AND DIASTOLIC HF/ Severe pulmonary HTN  -- Repeat 2D ECHO 03/15/15 with EF 30-35%, diffuse HK, akinesis of entire inferolat myocardium, G3DD, RV systolic function severely reduced, RA severely dilated, mod TR, mild PR, RV systolic pressure increased c/w severe pulmonary HTN ( PA pk pressure 81mm Hg).  -- Change carvedilol to 12.5mg  BID (due to bradycardia) and continue lisinopril 10mg  qd. She has not been able to tolerate spironolactone due to side effect of dizziness and hyperkalemia -- DC lasix drip and begin 120 mg IV BID; follow renal function.  CAD s/p CABG/Chest pain: cardiac catheterization had been planned. However the patient declines. I explained the risks of undiagnosed coronary disease including myocardial infarction and death. She would prefer medical therapy.  DM: A1c greater than 11. She was started on Lantus. Diabetes coordinator assistance appreciated.   Plan to potentially convert IV Lasix to by mouth tomorrow. Discharge late tomorrow evening or Monday pending symptoms.  Signed, Olga MillersBrian Crenshaw MD

## 2015-03-19 LAB — BASIC METABOLIC PANEL
Anion gap: 8 (ref 5–15)
BUN: 30 mg/dL — ABNORMAL HIGH (ref 6–20)
CO2: 29 mmol/L (ref 22–32)
Calcium: 9 mg/dL (ref 8.9–10.3)
Chloride: 97 mmol/L — ABNORMAL LOW (ref 101–111)
Creatinine, Ser: 1.11 mg/dL — ABNORMAL HIGH (ref 0.44–1.00)
GFR calc Af Amer: 60 mL/min — ABNORMAL LOW (ref >60)
GFR, EST NON AFRICAN AMERICAN: 52 mL/min — AB (ref >60)
Glucose, Bld: 174 mg/dL — ABNORMAL HIGH (ref 70–99)
Potassium: 4.3 mmol/L (ref 3.5–5.1)
Sodium: 134 mmol/L — ABNORMAL LOW (ref 135–145)

## 2015-03-19 LAB — GLUCOSE, CAPILLARY
GLUCOSE-CAPILLARY: 204 mg/dL — AB (ref 70–99)
GLUCOSE-CAPILLARY: 172 mg/dL — AB (ref 70–99)
Glucose-Capillary: 142 mg/dL — ABNORMAL HIGH (ref 70–99)
Glucose-Capillary: 190 mg/dL — ABNORMAL HIGH (ref 70–99)

## 2015-03-19 MED ORDER — TORSEMIDE 20 MG PO TABS
40.0000 mg | ORAL_TABLET | Freq: Two times a day (BID) | ORAL | Status: DC
  Administered 2015-03-19 – 2015-03-20 (×3): 40 mg via ORAL
  Filled 2015-03-19 (×5): qty 2

## 2015-03-19 MED ORDER — CARVEDILOL 6.25 MG PO TABS
6.2500 mg | ORAL_TABLET | Freq: Two times a day (BID) | ORAL | Status: DC
  Administered 2015-03-19 – 2015-03-20 (×3): 6.25 mg via ORAL
  Filled 2015-03-19 (×4): qty 1

## 2015-03-19 MED ORDER — LISINOPRIL 5 MG PO TABS
5.0000 mg | ORAL_TABLET | Freq: Every day | ORAL | Status: DC
  Administered 2015-03-19 – 2015-03-20 (×2): 5 mg via ORAL
  Filled 2015-03-19 (×2): qty 1

## 2015-03-19 NOTE — Progress Notes (Signed)
Patient Name: Christine BabeBarbara Knight Date of Encounter: 03/19/2015     Active Problems:   Acute on chronic systolic heart failure    SUBJECTIVE  Denies dyspnea; no chest pain  CURRENT MEDS . aspirin  81 mg Oral Daily  . atorvastatin  10 mg Oral Daily  . carvedilol  12.5 mg Oral BID  . furosemide  120 mg Intravenous BID  . heparin  5,000 Units Subcutaneous 3 times per day  . insulin aspart  0-20 Units Subcutaneous TID WC  . insulin glargine  15 Units Subcutaneous Daily  . lisinopril  10 mg Oral Daily  . sodium chloride  3 mL Intravenous Q12H    OBJECTIVE  Filed Vitals:   03/18/15 1454 03/18/15 2203 03/18/15 2240 03/19/15 0632  BP: 99/62 98/56 105/62 88/67  Pulse:  52 58 57  Temp:  98.4 F (36.9 C)  98.6 F (37 C)  TempSrc:  Oral  Oral  Resp:  18  18  Height:      Weight:    256 lb 2.8 oz (116.2 kg)  SpO2:  100%  97%    Intake/Output Summary (Last 24 hours) at 03/19/15 0924 Last data filed at 03/19/15 0813  Gross per 24 hour  Intake   1262 ml  Output   1500 ml  Net   -238 ml   Filed Weights   03/17/15 0600 03/18/15 0459 03/19/15 0632  Weight: 260 lb 5.8 oz (118.1 kg) 256 lb 9.6 oz (116.393 kg) 256 lb 2.8 oz (116.2 kg)    PHYSICAL EXAM  General: Pleasant, NAD. obese Neuro: Alert and oriented X 3. Moves all extremities spontaneously. HEENT:  Normal  Neck: Supple  Lungs:  CTA Heart: RRR  Abdomen: Soft, non-tender, non-distended Extremities: no edema  Accessory Clinical Findings   Basic Metabolic Panel  Recent Labs  03/18/15 0345 03/19/15 0340  NA 135 134*  K 4.0 4.3  CL 98* 97*  CO2 28 29  GLUCOSE 156* 174*  BUN 27* 30*  CREATININE 0.94 1.11*  CALCIUM 9.0 9.0    TELE  Sinus bradycardia  Radiology/Studies  Dg Chest 2 View  03/13/2015   CLINICAL DATA:  Chest pressure and shortness of breath for 2 weeks  EXAM: CHEST  2 VIEW  COMPARISON:  03/17/2013  FINDINGS: Cardiac shadow remains mildly enlarged. Postsurgical changes are seen. Mild  central vascular congestion is noted. No focal infiltrate or sizable effusion is seen. No bony abnormality is noted.  IMPRESSION: Central vascular congestion consistent with mild CHF.   Electronically Signed   By: Alcide CleverMark  Lukens M.D.   On: 03/13/2015 10:43    2D ECHO: 03/15/2015 LV EF: 30% -   35% Study Conclusions - Left ventricle: The cavity size was mildly dilated. Systolic   function was moderately to severely reduced. The estimated   ejection fraction was in the range of 30% to 35%. Diffuse   hypokinesis. There is akinesis of the entire inferolateral   myocardium. Other myocardial segments are not adequately   visualized. Recommend definity contrast study. There was a   reduced contribution of atrial contraction to ventricular   filling, due to increased ventricular diastolic pressure or   atrial contractile dysfunction. Doppler parameters are consistent   with a reversible restrictive pattern, indicative of decreased   left ventricular diastolic compliance and/or increased left   atrial pressure (grade 3 diastolic dysfunction). Doppler   parameters are consistent with high ventricular filling pressure. - Aortic valve: Trileaflet; normal thickness, mildly calcified  leaflets. - Mitral valve: Poorly visualized. - Right ventricle: Poorly visualized. The cavity size was   moderately dilated. Wall thickness was normal. Systolic function   was severely reduced. - Right atrium: The atrium was severely dilated. - Tricuspid valve: There was moderate regurgitation. - Pulmonic valve: There was mild regurgitation. - Pulmonary arteries: PA peak pressure: 81 mm Hg (S). Impressions: - The right ventricular systolic pressure was increased consistent   with severe pulmonary hypertension.   ASSESSMENT AND PLAN  This is a morbidly obese 64 year old African-American female with has medical history of CAD s/p 4v CABG (LIMA to LAD, SVG to diagonal, SVG to OM, SVG to PDA) on 08/13/2011, chronic  systolic heart failure with baseline EF around 35%, HTN, HLD, DM and pulmonary HTN who [facility] with dyspnea, orthopnea, PND and CP x 2 wks.  ACUTE ON CHRONIC SYSTOLIC AND DIASTOLIC HF/ Severe pulmonary HTN  -- Repeat 2D ECHO 03/15/15 with EF 30-35%, diffuse HK, akinesis of entire inferolat myocardium, G3DD, RV systolic function severely reduced, RA severely dilated, mod TR, mild PR, RV systolic pressure increased c/w severe pulmonary HTN ( PA pk pressure 81mm Hg).  -- Change carvedilol to 6.25mg  BID (due to bradycardia) and decrease lisinopril to 5 mg qd (low BP). She has not been able to tolerate spironolactone due to side effect of dizziness and hyperkalemia -- DC IV lasix and resume demadex 40 mg BID; follow renal function.  CAD s/p CABG/Chest pain: cardiac catheterization had been planned. However the patient declines. I explained the risks of undiagnosed coronary disease including myocardial infarction and death previously. She continues to decline and would prefer medical therapy.  DM: A1c greater than 11. She was started on Lantus. Diabetes coordinator assistance appreciated.   Possible DC in AM if stable.  Signed, Olga MillersBrian Saren Corkern MD

## 2015-03-20 ENCOUNTER — Telehealth: Payer: Self-pay | Admitting: Cardiology

## 2015-03-20 DIAGNOSIS — Z951 Presence of aortocoronary bypass graft: Secondary | ICD-10-CM

## 2015-03-20 DIAGNOSIS — I5043 Acute on chronic combined systolic (congestive) and diastolic (congestive) heart failure: Principal | ICD-10-CM

## 2015-03-20 LAB — GLUCOSE, CAPILLARY
Glucose-Capillary: 140 mg/dL — ABNORMAL HIGH (ref 70–99)
Glucose-Capillary: 170 mg/dL — ABNORMAL HIGH (ref 70–99)

## 2015-03-20 LAB — BASIC METABOLIC PANEL
ANION GAP: 9 (ref 5–15)
BUN: 23 mg/dL — ABNORMAL HIGH (ref 6–20)
CALCIUM: 8.8 mg/dL — AB (ref 8.9–10.3)
CO2: 26 mmol/L (ref 22–32)
CREATININE: 0.89 mg/dL (ref 0.44–1.00)
Chloride: 98 mmol/L — ABNORMAL LOW (ref 101–111)
GFR calc non Af Amer: >60 mL/min (ref >60)
Glucose, Bld: 143 mg/dL — ABNORMAL HIGH (ref 70–99)
POTASSIUM: 4.1 mmol/L (ref 3.5–5.1)
SODIUM: 133 mmol/L — AB (ref 135–145)

## 2015-03-20 MED ORDER — CARVEDILOL 6.25 MG PO TABS: 6.2500 mg | ORAL_TABLET | Freq: Two times a day (BID) | ORAL | Status: DC

## 2015-03-20 MED ORDER — NITROGLYCERIN 0.4 MG SL SUBL: 0.4000 mg | SUBLINGUAL_TABLET | SUBLINGUAL | Status: DC | PRN

## 2015-03-20 MED ORDER — LISINOPRIL 5 MG PO TABS: 5.0000 mg | ORAL_TABLET | Freq: Every day | ORAL | Status: DC

## 2015-03-20 MED ORDER — INSULIN GLARGINE 100 UNIT/ML ~~LOC~~ SOLN: 15.0000 [IU] | Freq: Every day | SUBCUTANEOUS | Status: DC

## 2015-03-20 NOTE — Discharge Summary (Signed)
Patient ID: Christine Knight,  MRN: [id number], DOB/AGE: April 18, 1951 64 y.o.  Admit date: 03/13/2015 Discharge date: 03/20/2015  Primary Care Provider: Scarlette Calico, MD Primary Cardiologist: Dr Ron Parker  Discharge Diagnoses Principal Problem:   Acute on chronic combined systolic and diastolic heart failure Active Problems:   Chest pain with high risk of acute coronary syndrome-(pt declined cath)   Type 2 diabetes mellitus, uncontrolled   Hx of CABG 2012   Ischemic cardiomyopathy   Pulmonary hypertension   Chronic systolic heart failure   Diastolic dysfunction-grade 3   Obesity, morbid-BMI 45   Hyperlipidemia LDL goal <70   Hypothyroidism   Spironolactone adverse reaction   Painful diabetic neuropathy   DJD (degenerative joint disease), multiple sites    Hospital Course:  Christine Knight is a pleasant, morbidly obese 64 year old African-American female with a past medical history of CAD s/p 4v CABG (LIMA to LAD, SVG to diagonal, SVG to OM, SVG to PDA) on 08/13/2011. She has chronic systolic heart failure with baseline EF around 35%, HTN, HLD, DM and pulmonary HTN. She presented 03/13/15 with dyspnea, orthopnea, PND and CP x 2 wks. Echo showed EF 30-35% with pulm HTN and grade 3 DD. She had AK in her inferior lateral wall. Rt and Lt heart cath was recommended but the pt declined, opting for medical treatment.  She diuresed 8L. Discharge wgt is 262 lbs. Her beta blocker was cut back secondary to bradycardia. Her DM was poorly controlled and she was placed on Lantus. She'll need a TOC 14 day office visit.   Discharge Vitals:  Blood pressure 105/70, pulse 65, temperature 98 F (36.7 C), temperature source Oral, resp. rate 18, height $RemoveBe'5\' 7"'mlGsIMLBe$  (1.702 m), weight 262 lb 14.4 oz (119.251 kg), SpO2 97 %.    Labs: Results for orders placed or performed during the hospital encounter of 03/13/15 (from the past 24 hour(s))  Glucose, capillary     Status: Abnormal   Collection Time: 03/19/15  4:50 PM    Result Value Ref Range   Glucose-Capillary 204 (H) 70 - 99 mg/dL   Comment 1 Notify RN   Glucose, capillary     Status: Abnormal   Collection Time: 03/19/15 10:26 PM  Result Value Ref Range   Glucose-Capillary 172 (H) 70 - 99 mg/dL  Basic metabolic panel     Status: Abnormal   Collection Time: 03/20/15  3:26 AM  Result Value Ref Range   Sodium 133 (L) 135 - 145 mmol/L   Potassium 4.1 3.5 - 5.1 mmol/L   Chloride 98 (L) 101 - 111 mmol/L   CO2 26 22 - 32 mmol/L   Glucose, Bld 143 (H) 70 - 99 mg/dL   BUN 23 (H) 6 - 20 mg/dL   Creatinine, Ser 0.89 0.44 - 1.00 mg/dL   Calcium 8.8 (L) 8.9 - 10.3 mg/dL   GFR calc non Af Amer >60 >60 mL/min   GFR calc Af Amer >60 >60 mL/min   Anion gap 9 5 - 15  Glucose, capillary     Status: Abnormal   Collection Time: 03/20/15  6:31 AM  Result Value Ref Range   Glucose-Capillary 140 (H) 70 - 99 mg/dL    Disposition:  Follow-up Information    Follow up with Dola Argyle, MD.   Specialty:  Cardiology   Why:  office will contact you   Contact information:   [address] [address] [phone]       Discharge Medications:  Medication List    STOP taking these medications        glipiZIDE 10 MG tablet  Commonly known as:  GLUCOTROL      TAKE these medications        acetaminophen 650 MG CR tablet  Commonly known as:  TYLENOL  Take 650 mg by mouth every 8 (eight) hours as needed for pain.     amoxicillin 500 MG capsule  Commonly known as:  AMOXIL  Take 500 mg by mouth every 6 (six) hours as needed. Infection prevention     aspirin EC 81 MG tablet  Take 1 tablet (81 mg total) by mouth daily.     atorvastatin 10 MG tablet  Commonly known as:  LIPITOR  Take 1 tablet (10 mg total) by mouth daily.     carvedilol 6.25 MG tablet  Commonly known as:  COREG  Take 1 tablet (6.25 mg total) by mouth 2 (two) times daily.     glucose blood test strip  Use TID     insulin glargine 100 UNIT/ML injection   Commonly known as:  LANTUS  Inject 0.15 mLs (15 Units total) into the skin daily.     lisinopril 5 MG tablet  Commonly known as:  PRINIVIL,ZESTRIL  Take 1 tablet (5 mg total) by mouth daily.     nitroGLYCERIN 0.4 MG SL tablet  Commonly known as:  NITROSTAT  Place 1 tablet (0.4 mg total) under the tongue every 5 (five) minutes x 3 doses as needed for chest pain.     ONETOUCH VERIO W/DEVICE Kit  1 Act by Does not apply route 3 (three) times daily.     torsemide 20 MG tablet  Commonly known as:  DEMADEX  Take 2 tablets (40 mg total) by mouth 2 (two) times daily.         Duration of Discharge Encounter: Greater than 30 minutes including physician time.  Angelena Form PA-C 03/20/2015 9:18 AM

## 2015-03-20 NOTE — Telephone Encounter (Signed)
7-10 day TOC fu--appt with Christine Knight 03-29-15 at 11a

## 2015-03-20 NOTE — Progress Notes (Signed)
    Subjective:  No chest pain or SOB  Objective:  Vital Signs in the last 24 hours: Temp:  [98 F (36.7 C)-98.3 F (36.8 C)] 98 F (36.7 C) (05/09 0635) Pulse Rate:  [58-65] 65 (05/09 0635) Resp:  [18] 18 (05/09 0635) BP: (105-112)/(70) 105/70 mmHg (05/09 0635) SpO2:  [97 %-100 %] 97 % (05/09 0635) Weight:  [279 lb 6.4 oz (126.735 kg)] 279 lb 6.4 oz (126.735 kg) (05/09 16100635)  Intake/Output from previous day:  Intake/Output Summary (Last 24 hours) at 03/20/15 0903 Last data filed at 03/20/15 0253  Gross per 24 hour  Intake    483 ml  Output   1300 ml  Net   -817 ml    Physical Exam: General appearance: alert, cooperative, no distress and morbidly obese Neck: no JVD Lungs: clear to auscultation bilaterally Heart: regular rate and rhythm Extremities: trace edema Skin: Skin color, texture, turgor normal. No rashes or lesions Neurologic: Grossly normal   Rate: 54  Rhythm: NSR, SB, Wenkebach  Lab Results: No results for input(s): WBC, HGB, PLT in the last 72 hours.  Recent Labs  03/19/15 0340 03/20/15 0326  NA 134* 133*  K 4.3 4.1  CL 97* 98*  CO2 29 26  GLUCOSE 174* 143*  BUN 30* 23*  CREATININE 1.11* 0.89   No results for input(s): TROPONINI in the last 72 hours.  Invalid input(s): CK, MB No results for input(s): INR in the last 72 hours.  Scheduled Meds: . aspirin  81 mg Oral Daily  . atorvastatin  10 mg Oral Daily  . carvedilol  6.25 mg Oral BID  . heparin  5,000 Units Subcutaneous 3 times per day  . insulin aspart  0-20 Units Subcutaneous TID WC  . insulin glargine  15 Units Subcutaneous Daily  . lisinopril  5 mg Oral Daily  . torsemide  40 mg Oral BID   Continuous Infusions:  PRN Meds:.acetaminophen, nitroGLYCERIN, ondansetron (ZOFRAN) IV   Imaging: Imaging results have been reviewed   Assessment/Plan:  morbidly obese [age] year old African-American female with has medical history of CAD s/p 4v CABG (LIMA to LAD, SVG to diagonal, SVG to OM,  SVG to PDA) on 08/13/2011, chronic systolic heart failure with baseline EF around 35%, HTN, HLD, DM and pulmonary HTN present with dyspnea, orthopnea, PND and CP x 2 wks. Echo showed EF 30-35% with pulm HTN and grade 3 DD. She had AK in her inferior lateral wall. Rt and Lt heart cath recommended but pt declined. She diuresed 8L. Beta blocker cut back secondary to bradycardia.    Principal Problem:   Acute on chronic combined systolic and diastolic heart failure Active Problems:   Chest pain with high risk of acute coronary syndrome-(pt declined cath)   Type 2 diabetes mellitus, uncontrolled   Hx of CABG 2012   Ischemic cardiomyopathy   Pulmonary hypertension   Chronic systolic heart failure   Diastolic dysfunction-grade 3   Obesity, morbid-BMI 45   Hyperlipidemia LDL goal <70   Hypothyroidism   Spironolactone adverse reaction   Painful diabetic neuropathy   DJD (degenerative joint disease), multiple sites   PLAN: Home today, TOC f/u 14 days. Her DC wgt was re checked- 262.2 lbs  Corine ShelterLuke Lileigh Fahringer PA-C 03/20/2015, 9:03 AM [phone]

## 2015-03-20 NOTE — Discharge Instructions (Signed)

## 2015-03-20 NOTE — Progress Notes (Signed)
D/C Tele, D/C IV, D/C instructions reviewed with pt. And D/C paperwork given to pt., pt. Verbalized understanding of D/C instructions, pt. Showed no signs or symptoms of distress. Pt. Left unit via transporter in wheelchair and will drive herself home, no narcotic medications given to prevent pt. From driving.

## 2015-03-21 NOTE — Telephone Encounter (Signed)
Left message for patient to call back regarding follow up

## 2015-03-22 NOTE — Telephone Encounter (Signed)
Left message for patient to call office regarding recent hospital discharge

## 2015-03-23 NOTE — Telephone Encounter (Signed)
LM on VM re: TCM f/u

## 2015-03-29 ENCOUNTER — Inpatient Hospital Stay: Payer: Managed Care, Other (non HMO) | Admitting: Internal Medicine

## 2015-03-29 ENCOUNTER — Encounter: Payer: Managed Care, Other (non HMO) | Admitting: Cardiology

## 2015-03-29 DIAGNOSIS — Z0289 Encounter for other administrative examinations: Secondary | ICD-10-CM

## 2015-03-30 ENCOUNTER — Encounter: Payer: Self-pay | Admitting: Cardiovascular Disease

## 2015-03-30 ENCOUNTER — Encounter: Payer: Self-pay | Admitting: Cardiology

## 2015-05-02 ENCOUNTER — Telehealth: Payer: Self-pay | Admitting: *Deleted

## 2015-05-02 NOTE — Telephone Encounter (Signed)
Unable to reach patient to have a1c drawn.

## 2015-05-12 ENCOUNTER — Encounter: Payer: Self-pay | Admitting: Cardiology

## 2015-05-12 ENCOUNTER — Ambulatory Visit (INDEPENDENT_AMBULATORY_CARE_PROVIDER_SITE_OTHER): Payer: BLUE CROSS/BLUE SHIELD | Admitting: Cardiology

## 2015-05-12 VITALS — BP 100/62 | HR 59 | Ht 67.0 in | Wt 281.8 lb

## 2015-05-12 DIAGNOSIS — J069 Acute upper respiratory infection, unspecified: Secondary | ICD-10-CM | POA: Diagnosis not present

## 2015-05-12 DIAGNOSIS — I5022 Chronic systolic (congestive) heart failure: Secondary | ICD-10-CM

## 2015-05-12 DIAGNOSIS — I255 Ischemic cardiomyopathy: Secondary | ICD-10-CM

## 2015-05-12 MED ORDER — AZITHROMYCIN 250 MG PO TABS: ORAL_TABLET | ORAL | Status: DC

## 2015-05-12 NOTE — Assessment & Plan Note (Signed)
Patient has significant joint pain. She hopes to follow-up with her rheumatologist next week.  I've written a letter today to document that she needs to be out of work for a period of time until she is feeling better.

## 2015-05-12 NOTE — Assessment & Plan Note (Signed)
The patient was recently hospitalized with volume overload. She diuresed well and her weight is now stable. I have tried to titrate her medications over time. She did not tolerate spironolactone.

## 2015-05-12 NOTE — Assessment & Plan Note (Signed)
The patient has an upper respiratory infection. She'll be seeing her primary physician next week. She does have a cough with duct of sputum that has some discoloration. I decided to give her a Z-pack. She will continue to use Mucinex and also try Zyrtec

## 2015-05-12 NOTE — Progress Notes (Signed)
Cardiology Office Note   Date:  05/12/2015   ID:  Christine Knight, DOB [birth year], MRN [id number]  PCP:  Scarlette Calico, MD  Cardiologist:  Dola Argyle, MD   Chief Complaint  Patient presents with  . Appointment    Follow-up CHF      History of Present Illness: Christine Knight is a 64 y.o. female who presents today to follow-up CHF. She has known coronary disease and left ventricular dysfunction. She was recently hospitalized with volume overload and some chest discomfort. She diuresed. Her EF was documented at 35%. It appeared that her right heart function had decreased over time. Recommendation was made to proceed with right and left heart cath while she was in the hospital. The patient decided she did not want this and wanted to be treated medically. She is seen today in the office early posthospitalization for follow-up. Unfortunately she is not feeling well. She is tearful because she is not able to go to work. She has a significant upper respiratory infection. In addition she has significant arthritic pain. She is not having any marked shortness of breath.    Past Medical History  Diagnosis Date  . Obesity, morbid   . Chronic systolic heart failure     Chronic systolic CHF  . Hyperlipidemia 08/10/11  . Diabetes mellitus type 2 in obese 08/10/11  . CAD (coronary artery disease) 08/2011    NSTEMI with subsequent CABG 08/13/2011 with LIMA-LAD, SVG-diagonal, SVG-OM, SVG-PDA  . Ischemic cardiomyopathy 08/13/2011    EF 20% in 08/2011, still 25% 10/21/2011  . Pleural effusion     Requiring L thoracentesis 09/02/11  . Mitral regurgitation     Mild by echo 10/21/11  . Hypertension   . Pulmonary hypertension     Echo, September, 2013, 72 mmHg.  Marland Kitchen Ejection fraction < 50%     EF 20%, October, 2012  //  EF 25% December, 2012  //   EF 35%, echo, September, 2013  . Myocardial infarction 08/2011  . Orthopnoea     "progressively worse over last 3 wks" (03/17/2013)  . CHF (congestive  heart failure)   . Carpal tunnel syndrome on both sides     "post OHS in 08/2011; resolved now" (03/17/2013)    Past Surgical History  Procedure Laterality Date  . Laparoscopy  1980's?    "removed gallstones; not gallbladder" (03/17/2013)  . Coronary artery bypass graft  08/13/11    CABG x4 with LIMA to LAD, SVG to Diag, SVG to OM, SVG to PDA, EVH via both thighs    Patient Active Problem List   Diagnosis Date Noted  . Chest pain with high risk of acute coronary syndrome-(pt declined cath) 03/20/2015  . Diastolic dysfunction-grade 3 03/20/2015  . Acute on chronic combined systolic and diastolic heart failure 65/46/5035  . DJD (degenerative joint disease), multiple sites 11/07/2014  . Painful diabetic neuropathy 10/10/2014  . Spironolactone adverse reaction 11/14/2013  . Chronic systolic heart failure 46/56/8127  . Hypothyroidism 03/18/2013  . Pulmonary hypertension   . Hx of CABG 2012 08/30/2011  . Obesity, morbid-BMI 45   . Type 2 diabetes mellitus, uncontrolled   . Ischemic cardiomyopathy 08/13/2011  . Hyperlipidemia LDL goal <70 08/10/2011      Current Outpatient Prescriptions  Medication Sig Dispense Refill  . aspirin 81 MG tablet Take 1 tablet (81 mg total) by mouth daily. 30 tablet 9  . atorvastatin (LIPITOR) 10 MG tablet Take 1 tablet (10 mg total) by mouth daily. 90 tablet 1  .  Blood Glucose Monitoring Suppl (ONETOUCH VERIO) W/DEVICE KIT 1 Act by Does not apply route 3 (three) times daily. 2 kit 0  . carvedilol (COREG) 6.25 MG tablet Take 1 tablet (6.25 mg total) by mouth 2 (two) times daily. 60 tablet 11  . glipiZIDE (GLUCOTROL) 10 MG tablet Take 10 mg by mouth 2 (two) times daily.  1  . glucose blood test strip Use TID 100 each 12  . lisinopril (PRINIVIL,ZESTRIL) 10 MG tablet Take 10 mg by mouth daily.  0  . torsemide (DEMADEX) 20 MG tablet Take 2 tablets (40 mg total) by mouth 2 (two) times daily. 360 tablet 3  . Vitamin D, Ergocalciferol, (DRISDOL) 50000 UNITS CAPS  capsule Take 50,000 Units by mouth 2 (two) times daily.  0  . azithromycin (ZITHROMAX Z-PAK) 250 MG tablet Follow instructions on pac. 6 each 0  . [DISCONTINUED] simvastatin (ZOCOR) 20 MG tablet Take 1 tablet (20 mg total) by mouth every evening. 90 tablet 1   No current facility-administered medications for this visit.    Allergies:   Review of patient's allergies indicates no known allergies.    Social History:  The patient  reports that she quit smoking about 36 years ago. Her smoking use included Cigarettes. She has never used smokeless tobacco. She reports that she drinks alcohol. She reports that she does not use illicit drugs.   Family History:  The patient's family history includes Heart disease in her father. There is no history of Cancer, Diabetes, Hypertension, or Kidney disease.    ROS:  Please see the history of present illness.    Patient denies fever, chills, headache, sweats, rash, change in vision, change in hearing, chest pain. She does have a productive cough. She denies nausea vomiting or diaphoresis.   PHYSICAL EXAM: VS:  BP 100/62 mmHg  Pulse 59  Ht $R'5\' 7"'ql$  (1.702 m)  Wt 281 lb 12.8 oz (127.824 kg)  BMI 44.13 kg/m2 ,  Patient is significantly overweight. She is tearful today because of her upper respiratory infection and her arthritic pain. She is missing work and she is very concerned about this. She is oriented to person time and place. Affect is normal. Head is atraumatic. Sclera and conjunctiva are normal. There is no jugulovenous distention. Lungs reveal scattered rhonchi. Cardiac exam reveals S1 and S2. The abdomen is soft. She is overweight. She has no significant peripheral edema today. There are no skin rashes.  EKG:   EKG is not done today. Recent Labs: 10/10/2014: ALT 23; TSH 2.55 03/13/2015: B Natriuretic Peptide 750.3*; Hemoglobin 13.2; Platelets 203 03/20/2015: BUN 23*; Creatinine, Ser 0.89; Potassium 4.1; Sodium 133*    Lipid Panel    Component Value  Date/Time   CHOL 167 03/14/2015 0524   TRIG 76 03/14/2015 0524   HDL 26* 03/14/2015 0524   CHOLHDL 6.4 03/14/2015 0524   VLDL 15 03/14/2015 0524   LDLCALC 126* 03/14/2015 0524      Wt Readings from Last 3 Encounters:  05/12/15 281 lb 12.8 oz (127.824 kg)  03/20/15 262 lb 14.4 oz (119.251 kg)  12/12/14 280 lb (127.007 kg)      Current medicines are reviewed  The patient understands her medications.     ASSESSMENT AND PLAN:

## 2015-05-12 NOTE — Patient Instructions (Signed)
**Note De-Identified Epifania Littrell Obfuscation** Medication Instructions:  Take Azithromycin Z-Pac as directed You may take over the counter Musinex, Zyrtec and Claritin  Labwork: None  Testing/Procedures: None  Follow-Up: Your physician recommends that you schedule a follow-up appointment in: 4 weeks.

## 2015-05-16 ENCOUNTER — Ambulatory Visit (INDEPENDENT_AMBULATORY_CARE_PROVIDER_SITE_OTHER): Payer: BLUE CROSS/BLUE SHIELD | Admitting: Internal Medicine

## 2015-05-16 ENCOUNTER — Ambulatory Visit (INDEPENDENT_AMBULATORY_CARE_PROVIDER_SITE_OTHER)
Admission: RE | Admit: 2015-05-16 | Discharge: 2015-05-16 | Disposition: A | Discharge status: Home / Self Care | Payer: BLUE CROSS/BLUE SHIELD | Source: Ambulatory Visit | Attending: Internal Medicine | Admitting: Internal Medicine

## 2015-05-16 ENCOUNTER — Encounter: Payer: Self-pay | Admitting: Internal Medicine

## 2015-05-16 VITALS — BP 118/78 | HR 61 | Temp 98.6°F | Resp 12 | Ht 67.0 in | Wt 284.0 lb

## 2015-05-16 DIAGNOSIS — R05 Cough: Secondary | ICD-10-CM

## 2015-05-16 DIAGNOSIS — E1165 Type 2 diabetes mellitus with hyperglycemia: Secondary | ICD-10-CM | POA: Diagnosis not present

## 2015-05-16 DIAGNOSIS — R059 Cough, unspecified: Secondary | ICD-10-CM

## 2015-05-16 DIAGNOSIS — I270 Primary pulmonary hypertension: Secondary | ICD-10-CM | POA: Diagnosis not present

## 2015-05-16 DIAGNOSIS — I272 Pulmonary hypertension, unspecified: Secondary | ICD-10-CM

## 2015-05-16 DIAGNOSIS — IMO0002 Reserved for concepts with insufficient information to code with codable children: Secondary | ICD-10-CM

## 2015-05-16 DIAGNOSIS — I5022 Chronic systolic (congestive) heart failure: Secondary | ICD-10-CM

## 2015-05-16 MED ORDER — PROMETHAZINE-DM 6.25-15 MG/5ML PO SYRP: 5.0000 mL | ORAL_SOLUTION | Freq: Four times a day (QID) | ORAL | Status: DC | PRN

## 2015-05-16 NOTE — Patient Instructions (Signed)
Cough, Adult  A cough is a reflex that helps clear your throat and airways. It can help heal the body or may be a reaction to an irritated airway. A cough may only last 2 or 3 weeks (acute) or may last more than 8 weeks (chronic).  CAUSES Acute cough:  Viral or bacterial infections. Chronic cough:  Infections.  Allergies.  Asthma.  Post-nasal drip.  Smoking.  Heartburn or acid reflux.  Some medicines.  Chronic lung problems (COPD).  Cancer. SYMPTOMS   Cough.  Fever.  Chest pain.  Increased breathing rate.  High-pitched whistling sound when breathing (wheezing).  Colored mucus that you cough up (sputum). TREATMENT   A bacterial cough may be treated with antibiotic medicine.  A viral cough must run its course and will not respond to antibiotics.  Your caregiver may recommend other treatments if you have a chronic cough. HOME CARE INSTRUCTIONS   Only take over-the-counter or prescription medicines for pain, discomfort, or fever as directed by your caregiver. Use cough suppressants only as directed by your caregiver.  Use a cold steam vaporizer or humidifier in your bedroom or home to help loosen secretions.  Sleep in a semi-upright position if your cough is worse at night.  Rest as needed.  Stop smoking if you smoke. SEEK IMMEDIATE MEDICAL CARE IF:   You have pus in your sputum.  Your cough starts to worsen.  You cannot control your cough with suppressants and are losing sleep.  You begin coughing up blood.  You have difficulty breathing.  You develop pain which is getting worse or is uncontrolled with medicine.  You have a fever. MAKE SURE YOU:   Understand these instructions.  Will watch your condition.  Will get help right away if you are not doing well or get worse. Document Released: 04/26/2011 Document Revised: 01/20/2012 Document Reviewed: 04/26/2011 [employer] Patient Information 2015 [employer]. This information is not intended  to replace advice given to you by your health care provider. Make sure you discuss any questions you have with your health care provider.  

## 2015-05-16 NOTE — Progress Notes (Signed)
Pre visit review using our clinic review tool, if applicable. No additional management support is needed unless otherwise documented below in the visit note. 

## 2015-05-17 MED ORDER — SITAGLIPTIN PHOS-METFORMIN HCL 50-1000 MG PO TABS: 1.0000 | ORAL_TABLET | Freq: Two times a day (BID) | ORAL | Status: DC

## 2015-05-17 NOTE — Progress Notes (Signed)
Subjective:  Patient ID: Christine Knight, female    DOB: 1951/09/28  Age: 64 y.o. MRN: [id number]  CC: Hospitalization Follow-up and Cough   HPI Christine Knight presents for the complaint of cough productive of white phlegm for 2 months - she was recently placed on zithromax for the last 3 days but that has not helped.  Outpatient Prescriptions Prior to Visit  Medication Sig Dispense Refill  . aspirin 81 MG tablet Take 1 tablet (81 mg total) by mouth daily. 30 tablet 9  . atorvastatin (LIPITOR) 10 MG tablet Take 1 tablet (10 mg total) by mouth daily. 90 tablet 1  . azithromycin (ZITHROMAX Z-PAK) 250 MG tablet Follow instructions on pac. 6 each 0  . Blood Glucose Monitoring Suppl (ONETOUCH VERIO) W/DEVICE KIT 1 Act by Does not apply route 3 (three) times daily. 2 kit 0  . carvedilol (COREG) 6.25 MG tablet Take 1 tablet (6.25 mg total) by mouth 2 (two) times daily. 60 tablet 11  . glipiZIDE (GLUCOTROL) 10 MG tablet Take 10 mg by mouth 2 (two) times daily.  1  . glucose blood test strip Use TID 100 each 12  . torsemide (DEMADEX) 20 MG tablet Take 2 tablets (40 mg total) by mouth 2 (two) times daily. 360 tablet 3  . Vitamin D, Ergocalciferol, (DRISDOL) 50000 UNITS CAPS capsule Take 50,000 Units by mouth 2 (two) times daily.  0  . lisinopril (PRINIVIL,ZESTRIL) 10 MG tablet Take 10 mg by mouth daily.  0   No facility-administered medications prior to visit.    ROS Review of Systems  Constitutional: Negative.  Negative for fever, chills, diaphoresis, activity change, appetite change, fatigue and unexpected weight change.  HENT: Negative.  Negative for ear discharge, facial swelling, sinus pressure, sore throat, trouble swallowing and voice change.   Eyes: Negative.   Respiratory: Positive for cough. Negative for apnea, choking, chest tightness, shortness of breath, wheezing and stridor.   Cardiovascular: Negative.  Negative for chest pain, palpitations and leg swelling.    Gastrointestinal: Negative.  Negative for nausea, vomiting, abdominal pain, diarrhea, constipation and blood in stool.  Endocrine: Negative.   Genitourinary: Negative.  Negative for difficulty urinating.  Musculoskeletal: Negative.   Skin: Negative.  Negative for rash.  Allergic/Immunologic: Negative.   Neurological: Negative.  Negative for dizziness, syncope, speech difficulty, light-headedness, numbness and headaches.  Hematological: Negative.   Psychiatric/Behavioral: Negative.     Objective:  BP 118/78 mmHg  Pulse 61  Temp(Src) 98.6 F (37 C) (Oral)  Resp 12  Ht _0  (1.702 m)  Wt 284 lb (128.822 kg)  BMI 44.47 kg/m2  SpO2 97%  BP Readings from Last 3 Encounters:  05/16/15 118/78  05/12/15 100/62  03/20/15 114/64    Wt Readings from Last 3 Encounters:  05/16/15 284 lb (128.822 kg)  05/12/15 281 lb 12.8 oz (127.824 kg)  03/20/15 262 lb 14.4 oz (119.251 kg)    Physical Exam  Constitutional: She is oriented to person, place, and time. She appears well-developed and well-nourished. No distress.  HENT:  Head: Normocephalic and atraumatic.  Mouth/Throat: Oropharynx is clear and moist. No oropharyngeal exudate.  Eyes: Conjunctivae are normal. Right eye exhibits no discharge. Left eye exhibits no discharge. No scleral icterus.  Neck: Normal range of motion. Neck supple. No JVD present. No tracheal deviation present. No thyromegaly present.  Cardiovascular: Normal rate, regular rhythm, normal heart sounds and intact distal pulses.  Exam reveals no gallop and no friction rub.   No murmur heard. Pulmonary/Chest:  Effort normal and breath sounds normal. No stridor. No respiratory distress. She has no wheezes. She has no rales. She exhibits no tenderness.  Abdominal: Soft. Bowel sounds are normal. She exhibits no distension and no mass. There is no tenderness. There is no rebound and no guarding.  Musculoskeletal: Normal range of motion. She exhibits no edema or tenderness.   Lymphadenopathy:    She has no cervical adenopathy.  Neurological: She is alert and oriented to person, place, and time. She has normal reflexes.  Skin: Skin is warm and dry. No rash noted. She is not diaphoretic. No erythema. No pallor.  Psychiatric: She has a normal mood and affect. Her behavior is normal. Judgment and thought content normal.  Vitals reviewed.   Lab Results  Component Value Date   WBC 6.5 03/13/2015   HGB 13.2 03/13/2015   HCT 40.1 03/13/2015   PLT 203 03/13/2015   GLUCOSE 143* 03/20/2015   CHOL 167 03/14/2015   TRIG 76 03/14/2015   HDL 26* 03/14/2015   LDLCALC 126* 03/14/2015   ALT 23 10/10/2014   AST 27 10/10/2014   NA 133* 03/20/2015   K 4.1 03/20/2015   CL 98* 03/20/2015   CREATININE 0.89 03/20/2015   BUN 23* 03/20/2015   CO2 26 03/20/2015   TSH 2.55 10/10/2014   INR 1.28 03/16/2015   HGBA1C 11.2* 03/13/2015   MICROALBUR 1.8 10/10/2014    Dg Chest 2 View  05/16/2015   CLINICAL DATA:  64 year old female with cough and congestion, shortness of breath and low grade fever x2 months  EXAM: CHEST  2 VIEW  COMPARISON:  Chest radiograph dated 03/13/2015  FINDINGS: Two views of the chest again demonstrates mild central vascular prominence similar to prior study. There is no focal consolidation. There is minimal blunting of the left costophrenic angle. No pneumothorax. Median sternotomy wires and CABG clips noted. Stable mild cardiomegaly. The osseous structures appear unremarkable.  IMPRESSION: No significant interval change. Stable central vascular prominence. No focal consolidation.   Electronically Signed   By: Christine Knight M.D.   On: 05/16/2015 16:53    Assessment & Plan:   Christine Knight was seen today for hospitalization follow-up and cough.  Diagnoses and all orders for this visit:  Cough- her exam and CXR are WNL, I think this is an ACEI-related cough so will stop the ACEI, will try phenergan-dm for the cough Orders: -     DG Chest 2 View; Future -      promethazine-dextromethorphan (PROMETHAZINE-DM) 6.25-15 MG/5ML syrup; Take 5 mLs by mouth 4 (four) times daily as needed for cough.   I have discontinued Ms. Sommers's lisinopril. I am also having her start on promethazine-dextromethorphan. Additionally, I am having her maintain her aspirin EC, glucose blood, ONETOUCH VERIO, torsemide, atorvastatin, carvedilol, Vitamin D (Ergocalciferol), glipiZIDE, and azithromycin.  Meds ordered this encounter  Medications  . promethazine-dextromethorphan (PROMETHAZINE-DM) 6.25-15 MG/5ML syrup    Sig: Take 5 mLs by mouth 4 (four) times daily as needed for cough.    Dispense:  118 mL    Refill:  0     Follow-up: Return in about 3 weeks (around 06/06/2015).  Scarlette Calico, MD

## 2015-05-17 NOTE — Assessment & Plan Note (Addendum)
Her blood sugars are not well controlled Will recheck her A1C and renal function Will start metformin and a DDP-4 inhibitor Will refer to ENDO

## 2015-05-17 NOTE — Assessment & Plan Note (Signed)
Will stop the ACEI due to cough Will recheck in 2-3 weeks May start an ARB

## 2015-05-17 NOTE — Assessment & Plan Note (Signed)
Will have to stop the ACEI  Will recheck her BP in 3 weeks and may start an ARB if needed

## 2015-05-29 ENCOUNTER — Other Ambulatory Visit: Payer: Self-pay

## 2015-05-29 ENCOUNTER — Encounter (HOSPITAL_COMMUNITY): Payer: Self-pay | Admitting: *Deleted

## 2015-05-29 ENCOUNTER — Emergency Department (HOSPITAL_COMMUNITY): Payer: BLUE CROSS/BLUE SHIELD

## 2015-05-29 ENCOUNTER — Inpatient Hospital Stay (HOSPITAL_COMMUNITY)
Admission: EM | Admit: 2015-05-29 | Discharge: 2015-06-10 | DRG: 280 | Disposition: A | Discharge status: Home / Self Care | Payer: BLUE CROSS/BLUE SHIELD | Attending: Internal Medicine | Admitting: Internal Medicine

## 2015-05-29 DIAGNOSIS — I2510 Atherosclerotic heart disease of native coronary artery without angina pectoris: Secondary | ICD-10-CM | POA: Diagnosis present

## 2015-05-29 DIAGNOSIS — Z87891 Personal history of nicotine dependence: Secondary | ICD-10-CM | POA: Diagnosis not present

## 2015-05-29 DIAGNOSIS — E1165 Type 2 diabetes mellitus with hyperglycemia: Secondary | ICD-10-CM | POA: Diagnosis present

## 2015-05-29 DIAGNOSIS — I5023 Acute on chronic systolic (congestive) heart failure: Secondary | ICD-10-CM | POA: Diagnosis present

## 2015-05-29 DIAGNOSIS — Z7982 Long term (current) use of aspirin: Secondary | ICD-10-CM

## 2015-05-29 DIAGNOSIS — Z6841 Body Mass Index (BMI) 40.0 and over, adult: Secondary | ICD-10-CM

## 2015-05-29 DIAGNOSIS — N390 Urinary tract infection, site not specified: Secondary | ICD-10-CM | POA: Diagnosis present

## 2015-05-29 DIAGNOSIS — N179 Acute kidney failure, unspecified: Secondary | ICD-10-CM | POA: Diagnosis present

## 2015-05-29 DIAGNOSIS — T500X5A Adverse effect of mineralocorticoids and their antagonists, initial encounter: Secondary | ICD-10-CM | POA: Diagnosis present

## 2015-05-29 DIAGNOSIS — M159 Polyosteoarthritis, unspecified: Secondary | ICD-10-CM | POA: Diagnosis not present

## 2015-05-29 DIAGNOSIS — I214 Non-ST elevation (NSTEMI) myocardial infarction: Secondary | ICD-10-CM | POA: Diagnosis present

## 2015-05-29 DIAGNOSIS — E038 Other specified hypothyroidism: Secondary | ICD-10-CM

## 2015-05-29 DIAGNOSIS — I272 Other secondary pulmonary hypertension: Secondary | ICD-10-CM | POA: Diagnosis present

## 2015-05-29 DIAGNOSIS — I248 Other forms of acute ischemic heart disease: Secondary | ICD-10-CM | POA: Diagnosis present

## 2015-05-29 DIAGNOSIS — E785 Hyperlipidemia, unspecified: Secondary | ICD-10-CM | POA: Diagnosis present

## 2015-05-29 DIAGNOSIS — N289 Disorder of kidney and ureter, unspecified: Secondary | ICD-10-CM

## 2015-05-29 DIAGNOSIS — R05 Cough: Secondary | ICD-10-CM | POA: Diagnosis present

## 2015-05-29 DIAGNOSIS — I255 Ischemic cardiomyopathy: Secondary | ICD-10-CM | POA: Diagnosis present

## 2015-05-29 DIAGNOSIS — M1990 Unspecified osteoarthritis, unspecified site: Secondary | ICD-10-CM | POA: Diagnosis present

## 2015-05-29 DIAGNOSIS — E162 Hypoglycemia, unspecified: Secondary | ICD-10-CM | POA: Diagnosis present

## 2015-05-29 DIAGNOSIS — E871 Hypo-osmolality and hyponatremia: Secondary | ICD-10-CM | POA: Diagnosis present

## 2015-05-29 DIAGNOSIS — R531 Weakness: Secondary | ICD-10-CM | POA: Diagnosis present

## 2015-05-29 DIAGNOSIS — I5022 Chronic systolic (congestive) heart failure: Secondary | ICD-10-CM | POA: Diagnosis present

## 2015-05-29 DIAGNOSIS — J80 Acute respiratory distress syndrome: Secondary | ICD-10-CM | POA: Diagnosis present

## 2015-05-29 DIAGNOSIS — I509 Heart failure, unspecified: Secondary | ICD-10-CM

## 2015-05-29 DIAGNOSIS — J180 Bronchopneumonia, unspecified organism: Secondary | ICD-10-CM | POA: Diagnosis present

## 2015-05-29 DIAGNOSIS — L8990 Pressure ulcer of unspecified site, unspecified stage: Secondary | ICD-10-CM | POA: Diagnosis present

## 2015-05-29 DIAGNOSIS — I10 Essential (primary) hypertension: Secondary | ICD-10-CM | POA: Diagnosis present

## 2015-05-29 DIAGNOSIS — Z951 Presence of aortocoronary bypass graft: Secondary | ICD-10-CM | POA: Diagnosis not present

## 2015-05-29 DIAGNOSIS — R404 Transient alteration of awareness: Secondary | ICD-10-CM

## 2015-05-29 DIAGNOSIS — E870 Hyperosmolality and hypernatremia: Secondary | ICD-10-CM | POA: Diagnosis present

## 2015-05-29 DIAGNOSIS — R059 Cough, unspecified: Secondary | ICD-10-CM | POA: Diagnosis present

## 2015-05-29 DIAGNOSIS — T500X5D Adverse effect of mineralocorticoids and their antagonists, subsequent encounter: Secondary | ICD-10-CM

## 2015-05-29 DIAGNOSIS — E1140 Type 2 diabetes mellitus with diabetic neuropathy, unspecified: Secondary | ICD-10-CM | POA: Diagnosis present

## 2015-05-29 DIAGNOSIS — E11649 Type 2 diabetes mellitus with hypoglycemia without coma: Secondary | ICD-10-CM | POA: Diagnosis present

## 2015-05-29 DIAGNOSIS — I5043 Acute on chronic combined systolic (congestive) and diastolic (congestive) heart failure: Secondary | ICD-10-CM | POA: Diagnosis present

## 2015-05-29 DIAGNOSIS — IMO0002 Reserved for concepts with insufficient information to code with codable children: Secondary | ICD-10-CM | POA: Diagnosis present

## 2015-05-29 DIAGNOSIS — I5021 Acute systolic (congestive) heart failure: Secondary | ICD-10-CM | POA: Diagnosis not present

## 2015-05-29 DIAGNOSIS — E039 Hypothyroidism, unspecified: Secondary | ICD-10-CM | POA: Diagnosis present

## 2015-05-29 LAB — CBC
HEMATOCRIT: 42.2 % (ref 36.0–46.0)
HEMOGLOBIN: 14.6 g/dL (ref 12.0–15.0)
MCH: 30 pg (ref 26.0–34.0)
MCHC: 34.6 g/dL (ref 30.0–36.0)
MCV: 86.7 fL (ref 78.0–100.0)
Platelets: 173 10*3/uL (ref 150–400)
RBC: 4.87 MIL/uL (ref 3.87–5.11)
RDW: 14.4 % (ref 11.5–15.5)
WBC: 5.2 10*3/uL (ref 4.0–10.5)

## 2015-05-29 LAB — CBG MONITORING, ED
Glucose-Capillary: 141 mg/dL — ABNORMAL HIGH (ref 65–99)
Glucose-Capillary: 73 mg/dL (ref 65–99)
Glucose-Capillary: 127 mg/dL — ABNORMAL HIGH (ref 65–99)

## 2015-05-29 LAB — BASIC METABOLIC PANEL
ANION GAP: 11 (ref 5–15)
BUN: 54 mg/dL — AB (ref 6–20)
CHLORIDE: 91 mmol/L — AB (ref 101–111)
CO2: 26 mmol/L (ref 22–32)
Calcium: 9.1 mg/dL (ref 8.9–10.3)
Creatinine, Ser: 1.5 mg/dL — ABNORMAL HIGH (ref 0.44–1.00)
GFR calc non Af Amer: 36 mL/min — ABNORMAL LOW (ref >60)
GFR, EST AFRICAN AMERICAN: 42 mL/min — AB (ref >60)
Glucose, Bld: 42 mg/dL — CL (ref 65–99)
Potassium: 4.5 mmol/L (ref 3.5–5.1)
SODIUM: 128 mmol/L — AB (ref 135–145)

## 2015-05-29 LAB — URINE MICROSCOPIC-ADD ON

## 2015-05-29 LAB — URINALYSIS, ROUTINE W REFLEX MICROSCOPIC
BILIRUBIN URINE: NEGATIVE
GLUCOSE, UA: NEGATIVE mg/dL
Ketones, ur: NEGATIVE mg/dL
Nitrite: NEGATIVE
Protein, ur: 30 mg/dL — AB
Specific Gravity, Urine: 1.015 (ref 1.005–1.030)
Urobilinogen, UA: 1 mg/dL (ref 0.0–1.0)
pH: 5 (ref 5.0–8.0)

## 2015-05-29 LAB — BRAIN NATRIURETIC PEPTIDE: B Natriuretic Peptide: 921.7 pg/mL — ABNORMAL HIGH (ref 0.0–100.0)

## 2015-05-29 LAB — GLUCOSE, CAPILLARY: GLUCOSE-CAPILLARY: 146 mg/dL — AB (ref 65–99)

## 2015-05-29 LAB — MRSA PCR SCREENING: MRSA by PCR: NEGATIVE

## 2015-05-29 LAB — TROPONIN I
TROPONIN I: 2.81 ng/mL — AB (ref <0.031)
Troponin I: 1.98 ng/mL (ref <0.031)

## 2015-05-29 MED ORDER — SODIUM CHLORIDE 0.9 % IV SOLN
INTRAVENOUS | Status: DC
  Administered 2015-05-29: 16:00:00 via INTRAVENOUS

## 2015-05-29 MED ORDER — SODIUM CHLORIDE 0.9 % IV BOLUS (SEPSIS)
500.0000 mL | Freq: Once | INTRAVENOUS | Status: AC
  Administered 2015-05-29: 500 mL via INTRAVENOUS

## 2015-05-29 MED ORDER — ACETAMINOPHEN 325 MG PO TABS
650.0000 mg | ORAL_TABLET | Freq: Once | ORAL | Status: AC
  Administered 2015-05-29: 650 mg via ORAL
  Filled 2015-05-29: qty 2

## 2015-05-29 MED ORDER — SODIUM CHLORIDE 0.9 % IJ SOLN
3.0000 mL | Freq: Two times a day (BID) | INTRAMUSCULAR | Status: DC
  Administered 2015-05-30 – 2015-06-09 (×10): 3 mL via INTRAVENOUS

## 2015-05-29 MED ORDER — SODIUM CHLORIDE 0.9 % IJ SOLN
3.0000 mL | INTRAMUSCULAR | Status: DC | PRN
  Administered 2015-05-31: 3 mL via INTRAVENOUS
  Administered 2015-06-05: 10 mL via INTRAVENOUS
  Filled 2015-05-29 (×2): qty 3

## 2015-05-29 MED ORDER — FUROSEMIDE 10 MG/ML IJ SOLN
40.0000 mg | Freq: Once | INTRAMUSCULAR | Status: AC
  Administered 2015-05-29: 40 mg via INTRAVENOUS
  Filled 2015-05-29: qty 4

## 2015-05-29 MED ORDER — ASPIRIN EC 81 MG PO TBEC
81.0000 mg | DELAYED_RELEASE_TABLET | Freq: Every day | ORAL | Status: DC
  Administered 2015-05-30 – 2015-06-10 (×12): 81 mg via ORAL
  Filled 2015-05-29 (×13): qty 1

## 2015-05-29 MED ORDER — INSULIN ASPART 100 UNIT/ML ~~LOC~~ SOLN
0.0000 [IU] | Freq: Every day | SUBCUTANEOUS | Status: DC
  Administered 2015-05-30: 2 [IU] via SUBCUTANEOUS
  Administered 2015-05-31: 3 [IU] via SUBCUTANEOUS
  Administered 2015-06-01 – 2015-06-06 (×2): 2 [IU] via SUBCUTANEOUS

## 2015-05-29 MED ORDER — CEFTRIAXONE SODIUM IN DEXTROSE 20 MG/ML IV SOLN
1.0000 g | INTRAVENOUS | Status: DC
  Administered 2015-05-30: 1 g via INTRAVENOUS
  Filled 2015-05-29 (×3): qty 50

## 2015-05-29 MED ORDER — SODIUM CHLORIDE 0.9 % IV SOLN: INTRAVENOUS | Status: AC

## 2015-05-29 MED ORDER — SODIUM CHLORIDE 0.9 % IJ SOLN
3.0000 mL | Freq: Two times a day (BID) | INTRAMUSCULAR | Status: DC
  Administered 2015-05-30 – 2015-06-05 (×9): 3 mL via INTRAVENOUS
  Administered 2015-06-05: 10 mL via INTRAVENOUS
  Administered 2015-06-06 – 2015-06-10 (×8): 3 mL via INTRAVENOUS

## 2015-05-29 MED ORDER — SODIUM CHLORIDE 0.9 % IV SOLN
250.0000 mL | INTRAVENOUS | Status: DC | PRN
Start: 2015-05-29 — End: 2015-06-10
  Administered 2015-05-30: 250 mL via INTRAVENOUS

## 2015-05-29 MED ORDER — ACETAMINOPHEN 325 MG PO TABS
650.0000 mg | ORAL_TABLET | Freq: Four times a day (QID) | ORAL | Status: DC | PRN
  Administered 2015-05-30: 650 mg via ORAL
  Filled 2015-05-29: qty 2

## 2015-05-29 MED ORDER — INSULIN ASPART 100 UNIT/ML ~~LOC~~ SOLN
0.0000 [IU] | Freq: Three times a day (TID) | SUBCUTANEOUS | Status: DC
  Administered 2015-05-30: 8 [IU] via SUBCUTANEOUS
  Administered 2015-05-30: 5 [IU] via SUBCUTANEOUS
  Administered 2015-05-30: 8 [IU] via SUBCUTANEOUS
  Administered 2015-05-31: 5 [IU] via SUBCUTANEOUS
  Administered 2015-05-31: 11 [IU] via SUBCUTANEOUS
  Administered 2015-05-31: 8 [IU] via SUBCUTANEOUS
  Administered 2015-06-01: 5 [IU] via SUBCUTANEOUS
  Administered 2015-06-01: 8 [IU] via SUBCUTANEOUS
  Administered 2015-06-01: 15 [IU] via SUBCUTANEOUS
  Administered 2015-06-02 (×2): 8 [IU] via SUBCUTANEOUS
  Administered 2015-06-02: 5 [IU] via SUBCUTANEOUS
  Administered 2015-06-03: 2 [IU] via SUBCUTANEOUS
  Administered 2015-06-03: 3 [IU] via SUBCUTANEOUS
  Administered 2015-06-03: 2 [IU] via SUBCUTANEOUS
  Administered 2015-06-04 (×3): 3 [IU] via SUBCUTANEOUS
  Administered 2015-06-05: 5 [IU] via SUBCUTANEOUS
  Administered 2015-06-05: 8 [IU] via SUBCUTANEOUS
  Administered 2015-06-05: 5 [IU] via SUBCUTANEOUS
  Administered 2015-06-05: 3 [IU] via SUBCUTANEOUS
  Administered 2015-06-06 (×2): 5 [IU] via SUBCUTANEOUS
  Administered 2015-06-06: 8 [IU] via SUBCUTANEOUS
  Administered 2015-06-07: 5 [IU] via SUBCUTANEOUS
  Administered 2015-06-07: 3 [IU] via SUBCUTANEOUS
  Administered 2015-06-07: 11 [IU] via SUBCUTANEOUS
  Administered 2015-06-08: 5 [IU] via SUBCUTANEOUS
  Administered 2015-06-08: 2 [IU] via SUBCUTANEOUS
  Administered 2015-06-08: 11 [IU] via SUBCUTANEOUS
  Administered 2015-06-09 (×2): 5 [IU] via SUBCUTANEOUS
  Administered 2015-06-09: 11 [IU] via SUBCUTANEOUS
  Administered 2015-06-10: 3 [IU] via SUBCUTANEOUS

## 2015-05-29 MED ORDER — FUROSEMIDE 10 MG/ML IJ SOLN
40.0000 mg | Freq: Two times a day (BID) | INTRAMUSCULAR | Status: DC
  Administered 2015-05-30 – 2015-06-01 (×6): 40 mg via INTRAVENOUS
  Filled 2015-05-29 (×8): qty 4

## 2015-05-29 MED ORDER — HEPARIN SODIUM (PORCINE) 5000 UNIT/ML IJ SOLN
5000.0000 [IU] | Freq: Three times a day (TID) | INTRAMUSCULAR | Status: DC
  Administered 2015-05-29 – 2015-06-05 (×19): 5000 [IU] via SUBCUTANEOUS
  Filled 2015-05-29 (×25): qty 1

## 2015-05-29 MED ORDER — CEFTRIAXONE SODIUM 1 G IJ SOLR
1.0000 g | Freq: Once | INTRAMUSCULAR | Status: AC
  Administered 2015-05-29: 1 g via INTRAVENOUS
  Filled 2015-05-29: qty 10

## 2015-05-29 MED ORDER — DEXTROSE 50 % IV SOLN
1.0000 | Freq: Once | INTRAVENOUS | Status: AC
  Administered 2015-05-29: 50 mL via INTRAVENOUS
  Filled 2015-05-29: qty 50

## 2015-05-29 MED ORDER — ASPIRIN 81 MG PO CHEW
324.0000 mg | CHEWABLE_TABLET | Freq: Once | ORAL | Status: AC
  Administered 2015-05-29: 324 mg via ORAL
  Filled 2015-05-29: qty 4

## 2015-05-29 NOTE — Consult Note (Signed)
CARDIOLOGY CONSULT NOTE   Patient ID: Lodema Parma MRN: [id number] DOB/AGE: March 09, 1951 64 y.o.  Admit Date: 05/29/2015  Primary Physician: Sanda Linger, MD  Primary Cardiologist    Myrtis Ser   Clinical Summary Ms. Fritzsche is a 64 y.o.female. She presented the emergency room today with decreased mental status. Her glucose was 40. This was treated in her mental status improved. However her chest x-ray shows pulmonary edema. Her troponin is elevated.  I know this patient well from the office. She has known coronary disease post CABG in the past. She has significant left ventricular dysfunction. I have tried over time to optimize her medications and then begin to consider whether an ICD should be placed. Recently she was hospitalized with CHF. Right and left heart cath was recommended. The patient did not want this. She was discharged home and I saw her back in the office. On that day her volume status was stable. However she was having a cough. Since that time she's been seen by her primary physician. ACE inhibitor was put on hold and her cough was being treated. However despite this she has continued to feel poorly. She then presented to the emergency room today.   Allergies  Allergen Reactions  . Lisinopril Cough    cough    Medications Scheduled Medications:     Infusions: . sodium chloride 125 mL/hr at 05/29/15 1619     PRN Medications:     Past Medical History  Diagnosis Date  . Obesity, morbid   . Chronic systolic heart failure     Chronic systolic CHF  . Hyperlipidemia 08/10/11  . Diabetes mellitus type 2 in obese 08/10/11  . CAD (coronary artery disease) 08/2011    NSTEMI with subsequent CABG 08/13/2011 with LIMA-LAD, SVG-diagonal, SVG-OM, SVG-PDA  . Ischemic cardiomyopathy 08/13/2011    EF 20% in 08/2011, still 25% 10/21/2011  . Pleural effusion     Requiring L thoracentesis 09/02/11  . Mitral regurgitation     Mild by echo 10/21/11  . Hypertension   .  Pulmonary hypertension     Echo, September, 2013, 72 mmHg.  Marland Kitchen Ejection fraction < 50%     EF 20%, October, 2012  //  EF 25% December, 2012  //   EF 35%, echo, September, 2013  . Myocardial infarction 08/2011  . Orthopnoea     "progressively worse over last 3 wks" (03/17/2013)  . CHF (congestive heart failure)   . Carpal tunnel syndrome on both sides     "post OHS in 08/2011; resolved now" (03/17/2013)    Past Surgical History  Procedure Laterality Date  . Laparoscopy  1980's?    "removed gallstones; not gallbladder" (03/17/2013)  . Coronary artery bypass graft  08/13/11    CABG x4 with LIMA to LAD, SVG to Diag, SVG to OM, SVG to PDA, EVH via both thighs    Family History  Problem Relation Age of Onset  . Heart disease Father   . Cancer Neg Hx   . Diabetes Neg Hx   . Hypertension Neg Hx   . Kidney disease Neg Hx     Social History Ms. Castilleja reports that she quit smoking about 36 years ago. Her smoking use included Cigarettes. She has never used smokeless tobacco. Ms. Szczygiel reports that she drinks alcohol.  Review of Systems Patient denies fever, chills, headache, sweats, rash, change in vision, change in hearing, chest pain, nausea or vomiting, urinary symptoms. All other systems are reviewed and are negative.  Physical Examination Blood pressure 115/71, pulse 68, temperature 98.7 F (37.1 C), temperature source Oral, resp. rate 24, height 5\' 7"  (1.702 m), weight 280 lb (127.007 kg), SpO2 96 %. No intake or output data in the 24 hours ending 05/29/15 1807  Her mental status has improved since her hypoglycemia was treated. Currently she is oriented to person time and place. Affect is normal. There is a family member in the room. Head is atraumatic. Sclera and conjunctiva are normal. She has rales or rhonchi. There is no respiratory distress at this time. Cardiac exam reveals S1 and S2. The abdomen is soft. She does have 1+ peripheral edema. There are no musculoskeletal  deformities. Neurologic is grossly intact at this time.  Prior Cardiac Testing/Procedures  Lab Results  Basic Metabolic Panel:  Recent Labs Lab 05/29/15 1131  NA 128*  K 4.5  CL 91*  CO2 26  GLUCOSE 42*  BUN 54*  CREATININE 1.50*  CALCIUM 9.1    Liver Function Tests: No results for input(s): AST, ALT, ALKPHOS, BILITOT, PROT, ALBUMIN in the last 168 hours.  CBC:  Recent Labs Lab 05/29/15 1131  WBC 5.2  HGB 14.6  HCT 42.2  MCV 86.7  PLT 173    Cardiac Enzymes:  Recent Labs Lab 05/29/15 1608  TROPONINI 2.81*    BNP: Invalid input(s): POCBNP   Radiology: Dg Chest 2 View  05/29/2015   CLINICAL DATA:  Cough, congestion, wheezing and fever today.  EXAM: CHEST  2 VIEW  COMPARISON:  May 16, 2015  FINDINGS: The heart size and mediastinal contours are stable. The heart size is enlarged. Patient status post prior CABG and median sternotomy. There is pulmonary edema. There is no focal pneumonia. There is no pleural effusion. The visualized skeletal structures are stable.  IMPRESSION: Congestive heart failure.   Electronically Signed   By: Sherian ReinWei-Chen  Lin M.D.   On: 05/29/2015 14:50   Ct Head Wo Contrast  05/29/2015   CLINICAL DATA:  Generalized weakness for 1 week, slurred speech.  EXAM: CT HEAD WITHOUT CONTRAST  TECHNIQUE: Contiguous axial images were obtained from the base of the skull through the vertex without intravenous contrast.  COMPARISON:  MRI 12/19/2013  FINDINGS: No acute intracranial abnormality. Specifically, no hemorrhage, hydrocephalus, mass lesion, acute infarction, or significant intracranial injury. No acute calvarial abnormality. Visualized paranasal sinuses and mastoids clear. Orbital soft tissues unremarkable.  IMPRESSION: Negative.   Electronically Signed   By: Charlett NoseKevin  Dover M.D.   On: 05/29/2015 12:14     ECG: EKG is done today and reviewed by me. There is sinus rhythm. There are old changes of an old anterior MI. There is no acute  change.   Impression and Recommendations     Type 2 diabetes mellitus, uncontrolled    Ischemic cardiomyopathy     I have been trying to adjust her medicines as an outpatient with plans to repeat her echo and consider whether she should have an ICD placed. However with a recent admission in need for repeat catheterization I cannot proceed until we have more information.    Cough    She's had a persistent cough. Her ACE inhibitor was stopped. There may be an infectious component to it. She has been receiving medications from her primary physician. I suspect her pulmonary status has put additional stress on her overall cardiac status.    NSTEMI (non-ST elevated myocardial infarction)      At this point it is not possible to save her troponin elevation is  related to her CHF or a primary ischemic event. I want to proceed with right and left heart catheterization this admission. She tells me in the emergency room that she does not want to undergo cardiac catheterization. From our viewpoint we will need to try to stabilize her volume status to best possible.    Acute on chronic systolic CHF (congestive heart failure)     Patient is clearly volume overloaded at this time. Her hyponatremia and increased BUN and creatinine are due to her heart failure. She has received diuretics and she is already beginning to respond in the emergency room. Plan to continue to stabilize her volume status.    Hypoglycemia    she had significant hypoglycemia in the emergency room that caused mental status changes. In general her diabetes is out of control. We will have to try to stabilize this problem during her hospitalization.    Jerral Bonito, MD 05/29/2015, 6:07 PM

## 2015-05-29 NOTE — ED Provider Notes (Signed)
CSN: [id number]     Arrival date & time 05/29/15  1101 History   First MD Initiated Contact with Patient 05/29/15 1117     Chief Complaint  Patient presents with  . Aphasia  . Weakness     (Consider location/radiation/quality/duration/timing/severity/associated sxs/prior Treatment) HPI   Christine Knight is a 64 y.o. female who presents for evaluation of weakness, decreased appetite, lethargy, and slurred speech. It is gradual in onset over last 3-4 days. He also has a cough productive of white sputum, but no fever, or vomiting. She's been using her regular medications. No similar problems in the past. There are no other known modifying factors.   Past Medical History  Diagnosis Date  . Obesity, morbid   . Chronic systolic heart failure     Chronic systolic CHF  . Hyperlipidemia 08/10/11  . Diabetes mellitus type 2 in obese 08/10/11  . CAD (coronary artery disease) 08/2011    NSTEMI with subsequent CABG 08/13/2011 with LIMA-LAD, SVG-diagonal, SVG-OM, SVG-PDA  . Ischemic cardiomyopathy 08/13/2011    EF 20% in 08/2011, still 25% 10/21/2011  . Pleural effusion     Requiring L thoracentesis 09/02/11  . Mitral regurgitation     Mild by echo 10/21/11  . Hypertension   . Pulmonary hypertension     Echo, September, 2013, 72 mmHg.  Marland Kitchen Ejection fraction < 50%     EF 20%, October, 2012  //  EF 25% December, 2012  //   EF 35%, echo, September, 2013  . Myocardial infarction 08/2011  . Orthopnoea     "progressively worse over last 3 wks" (03/17/2013)  . CHF (congestive heart failure)   . Carpal tunnel syndrome on both sides     "post OHS in 08/2011; resolved now" (03/17/2013)   Past Surgical History  Procedure Laterality Date  . Laparoscopy  1980's?    "removed gallstones; not gallbladder" (03/17/2013)  . Coronary artery bypass graft  08/13/11    CABG x4 with LIMA to LAD, SVG to Diag, SVG to OM, SVG to PDA, EVH via both thighs   Family History  Problem Relation Age of Onset  . Heart disease  Father   . Cancer Neg Hx   . Diabetes Neg Hx   . Hypertension Neg Hx   . Kidney disease Neg Hx    History  Substance Use Topics  . Smoking status: Former Smoker    Types: Cigarettes    Quit date: 11/11/1978  . Smokeless tobacco: Never Used     Comment: 03/17/2013 "only a social smoker when I did smoke; ever bought any"  . Alcohol Use: Yes     Comment: 03/17/2013 "in the last 6 months I've had 1 glass of wine"   OB History    No data available     Review of Systems  All other systems reviewed and are negative.     Allergies  Lisinopril  Home Medications   Prior to Admission medications   Medication Sig Start Date End Date Taking? Authorizing Provider  acetaminophen (TYLENOL) 325 MG tablet Take 650 mg by mouth every 6 (six) hours as needed for mild pain.   Yes Historical Provider, MD  aspirin 81 MG tablet Take 1 tablet (81 mg total) by mouth daily. 11/15/13  Yes Carlena Bjornstad, MD  Blood Glucose Monitoring Suppl (ONETOUCH VERIO) W/DEVICE KIT 1 Act by Does not apply route 3 (three) times daily. 10/12/14  Yes Janith Lima, MD  glipiZIDE (GLUCOTROL) 10 MG tablet Take 10 mg  by mouth 2 (two) times daily. 02/20/15  Yes Historical Provider, MD  glucose blood test strip Use TID 10/12/14  Yes Janith Lima, MD  sitaGLIPtin-metformin (JANUMET) 50-1000 MG per tablet Take 1 tablet by mouth 2 (two) times daily with a meal. 05/17/15  Yes Janith Lima, MD  torsemide (DEMADEX) 20 MG tablet Take 2 tablets (40 mg total) by mouth 2 (two) times daily. 11/09/14  Yes Carlena Bjornstad, MD  Vitamin D, Ergocalciferol, (DRISDOL) 50000 UNITS CAPS capsule Take 50,000 Units by mouth 2 (two) times a week.  04/27/15  Yes Historical Provider, MD  atorvastatin (LIPITOR) 10 MG tablet Take 1 tablet (10 mg total) by mouth daily. Patient not taking: Reported on 05/29/2015 12/12/14   Carlena Bjornstad, MD  promethazine-dextromethorphan (PROMETHAZINE-DM) 6.25-15 MG/5ML syrup Take 5 mLs by mouth 4 (four) times daily as needed for  cough. Patient not taking: Reported on 05/29/2015 05/16/15   Janith Lima, MD   BP 115/71 mmHg  Pulse 68  Temp(Src) 98.7 F (37.1 C) (Oral)  Resp 24  Ht $R'5\' 7"'yv$  (1.702 m)  Wt 280 lb (127.007 kg)  BMI 43.84 kg/m2  SpO2 96% Physical Exam  Constitutional: She is oriented to person, place, and time. She appears well-developed and well-nourished.  HENT:  Head: Normocephalic and atraumatic.  Right Ear: External ear normal.  Left Ear: External ear normal.  Eyes: Conjunctivae and EOM are normal. Pupils are equal, round, and reactive to light.  Neck: Normal range of motion and phonation normal. Neck supple.  Cardiovascular: Normal rate, regular rhythm and normal heart sounds.   Pulmonary/Chest: Effort normal and breath sounds normal. She exhibits no bony tenderness.  Abdominal: Soft. There is no tenderness.  Musculoskeletal: Normal range of motion.  Neurological: She is alert and oriented to person, place, and time. No cranial nerve deficit or sensory deficit. She exhibits normal muscle tone. Coordination normal.  Skin: Skin is warm, dry and intact.  Psychiatric: She has a normal mood and affect. Her behavior is normal. Judgment and thought content normal.  Nursing note and vitals reviewed.   ED Course  Procedures (including critical care time)  Patient was evaluated initially by me, at the time  her glucose returned low. She is alert, conversant and did appear to have some slurred speech, and left facial droop. These symptoms completely resolved with administration of D50.  Medications  0.9 %  sodium chloride infusion ( Intravenous New Bag/Given 05/29/15 1619)  aspirin chewable tablet 324 mg (not administered)  dextrose 50 % solution 50 mL (50 mLs Intravenous Given 05/29/15 1217)  sodium chloride 0.9 % bolus 500 mL (0 mLs Intravenous Stopped 05/29/15 1623)  acetaminophen (TYLENOL) tablet 650 mg (650 mg Oral Given 05/29/15 1420)  furosemide (LASIX) injection 40 mg (40 mg Intravenous Given  05/29/15 1620)  cefTRIAXone (ROCEPHIN) 1 g in dextrose 5 % 50 mL IVPB (1 g Intravenous New Bag/Given 05/29/15 1629)    Patient Vitals for the past 24 hrs:  BP Temp Temp src Pulse Resp SpO2 Height Weight  05/29/15 1730 - - - 68 24 96 % - -  05/29/15 1715 115/71 mmHg - - - - - - -  05/29/15 1645 118/73 mmHg - - 61 13 95 % - -  05/29/15 1631 - 98.7 F (37.1 C) Oral - - - - -  05/29/15 1630 114/82 mmHg - - 65 18 93 % - -  05/29/15 1615 - - - 66 17 95 % - -  05/29/15 1613  104/55 mmHg - - 68 - 92 % - -  05/29/15 1545 - - - 63 20 92 % - -  05/29/15 1519 123/67 mmHg - - 74 22 99 % - -  05/29/15 1515 112/69 mmHg - - 67 23 94 % - -  05/29/15 1415 125/89 mmHg - - - 21 - - -  05/29/15 1403 - 100.2 F (37.9 C) - - - - - -  05/29/15 1345 114/70 mmHg - - 67 24 100 % - -  05/29/15 1315 117/58 mmHg - - 66 24 97 % - -  05/29/15 1300 124/73 mmHg - - 66 26 98 % - -  05/29/15 1230 112/98 mmHg - - 68 16 94 % - -  05/29/15 1216 116/72 mmHg - - 96 25 99 % - -  05/29/15 1145 131/84 mmHg - - 66 22 99 % - -  05/29/15 1130 125/72 mmHg - - 63 25 96 % - -  05/29/15 1109 121/71 mmHg 98.9 F (37.2 C) Oral 62 20 94 % $Re'5\' 7"'Ztk$  (1.702 m) 280 lb (127.007 kg)    3:46 PM Reevaluation with update and discussion. After initial assessment and treatment, an updated evaluation reveals she remains alert. CBG is somewhat low at 74. She is not eating yet, but has been able to drink. She agrees to try eating a sandwich at this time. At this time, the nursing staff is concerned because her cardiac monitor has shown asystole several times. I was with the patient, when a similar asystolic event occurred, however, it was notable for the presence of a regular cardiac beats, with low amplitude, but not asystole. At this time, the patient was wide awake, alert, and had a normal blood pressure. Shirlena Brinegar L   17:30- troponin is positive. At this time. The patient denies chest pain or shortness of breath, while resting, with nasal cannula  oxygen.  17:31- cardiology paged to see patient and likely admit. She was treated by them in May 2016 for congestive heart failure exacerbation, offered Cath, but declined.  17:40- Cardiology request Medicine admission, Dr. Ron Parker will see as consultation for cardiology.  5:44 PM-Consult complete with Hospitalist. Patient case explained and discussed. She agrees to admit patient for further evaluation and treatment. Call ended at 17:50  Labs Review Labs Reviewed  BASIC METABOLIC PANEL - Abnormal; Notable for the following:    Sodium 128 (*)    Chloride 91 (*)    Glucose, Bld 42 (*)    BUN 54 (*)    Creatinine, Ser 1.50 (*)    GFR calc non Af Amer 36 (*)    GFR calc Af Amer 42 (*)    All other components within normal limits  URINALYSIS, ROUTINE W REFLEX MICROSCOPIC (NOT AT [facility]) - Abnormal; Notable for the following:    Hgb urine dipstick MODERATE (*)    Protein, ur 30 (*)    Leukocytes, UA MODERATE (*)    All other components within normal limits  URINE MICROSCOPIC-ADD ON - Abnormal; Notable for the following:    Squamous Epithelial / LPF FEW (*)    Bacteria, UA FEW (*)    Crystals CA OXALATE CRYSTALS (*)    All other components within normal limits  TROPONIN I - Abnormal; Notable for the following:    Troponin I 2.81 (*)    All other components within normal limits  BRAIN NATRIURETIC PEPTIDE - Abnormal; Notable for the following:    B Natriuretic Peptide 921.7 (*)  All other components within normal limits  CBG MONITORING, ED - Abnormal; Notable for the following:    Glucose-Capillary 141 (*)    All other components within normal limits  CBC  CBG MONITORING, ED   BUN  Date Value Ref Range Status  05/29/2015 54* 6 - 20 mg/dL Final  03/20/2015 23* 6 - 20 mg/dL Final  03/19/2015 30* 6 - 20 mg/dL Final  03/18/2015 27* 6 - 20 mg/dL Final   CREATININE, SER  Date Value Ref Range Status  05/29/2015 1.50* 0.44 - 1.00 mg/dL Final  03/20/2015 0.89 0.44 - 1.00 mg/dL Final   03/19/2015 1.11* 0.44 - 1.00 mg/dL Final  03/18/2015 0.94 0.44 - 1.00 mg/dL Final   CRITICAL CARE Performed by: Daleen Bo L Total critical care time: 45 minutes Critical care time was exclusive of separately billable procedures and treating other patients. Critical care was necessary to treat or prevent imminent or life-threatening deterioration. Critical care was time spent personally by me on the following activities: development of treatment plan with patient and/or surrogate as well as nursing, discussions with consultants, evaluation of patient's response to treatment, examination of patient, obtaining history from patient or surrogate, ordering and performing treatments and interventions, ordering and review of laboratory studies, ordering and review of radiographic studies, pulse oximetry and re-evaluation of patient's condition.   Imaging Review Dg Chest 2 View  05/29/2015   CLINICAL DATA:  Cough, congestion, wheezing and fever today.  EXAM: CHEST  2 VIEW  COMPARISON:  May 16, 2015  FINDINGS: The heart size and mediastinal contours are stable. The heart size is enlarged. Patient status post prior CABG and median sternotomy. There is pulmonary edema. There is no focal pneumonia. There is no pleural effusion. The visualized skeletal structures are stable.  IMPRESSION: Congestive heart failure.   Electronically Signed   By: Abelardo Diesel M.D.   On: 05/29/2015 14:50   Ct Head Wo Contrast  05/29/2015   CLINICAL DATA:  Generalized weakness for 1 week, slurred speech.  EXAM: CT HEAD WITHOUT CONTRAST  TECHNIQUE: Contiguous axial images were obtained from the base of the skull through the vertex without intravenous contrast.  COMPARISON:  MRI 12/19/2013  FINDINGS: No acute intracranial abnormality. Specifically, no hemorrhage, hydrocephalus, mass lesion, acute infarction, or significant intracranial injury. No acute calvarial abnormality. Visualized paranasal sinuses and mastoids clear. Orbital  soft tissues unremarkable.  IMPRESSION: Negative.   Electronically Signed   By: Rolm Baptise M.D.   On: 05/29/2015 12:14     EKG Interpretation   Date/Time:  Monday May 29 2015 15:25:51 EDT Ventricular Rate:  75 PR Interval:  161 QRS Duration: 109 QT Interval:  391 QTC Calculation: 437 R Axis:   129 Text Interpretation:  Sinus rhythm Multiform ventricular premature  complexes Anterior infarct, old Since last tracing of earlier today  Premature ventricular complexes have occured and the native rate is slower  Confirmed by [facility]  MD, Gio Janoski [phone]) on 05/29/2015 3:45:50 PM      MDM   Final diagnoses:  Hypoglycemia  Transient alteration of awareness  Renal insufficiency  UTI (lower urinary tract infection)  CHF exacerbation  NSTEMI (non-ST elevated myocardial infarction)    Malaise, with anorexia, and altered mental status. Patient's evaluation consistent with hypoglycemia and NSTEMI. She has mild renal insufficiency at this time. She has mild hypoglycemia and congestive heart failure. She may have a urinary tract infection. Doubt pyelonephritis, or sepsis. No evidence for CVA, or persistent localizing neurologic abnormality. History of congestive heart  failure with increased pulmonary fluid, despite her recent anorexia and decreased oral intake. History of ejection fraction of 30-40%, on echo 2 years ago. Doubt ACS, at this time. She has mild renal insufficiency at this time, making treatment of fluid overload, difficult.I suspect the low voltage EKG is related to her body habitus and her intravascular volume overload.  Nursing Notes Reviewed/ Care Coordinated Applicable Imaging Reviewed Interpretation of Laboratory Data incorporated into ED treatment  Plan: Admit    Daleen Bo, MD 05/29/15 1754

## 2015-05-29 NOTE — ED Notes (Signed)
Pt presents via [facility] for slurred speech x 2 days and generalized weakness x 1 week. Pt d/c in June for respiratory infection.  Per EMS pt has not been able to get off of the couch since Saturday. Pt normally drives and lives on the 2nd floor apt.  Pt leaning to the left on arrival.  Pt denies pain/vomiting/diarrhea.  Pt incontinent on arrival, pupils equal and reactive per EMS.  Hx: CHF, HTN, DM, High cholesterol.  BP-112/65 P-50-60s, O2-95% RA, CBG-72.  EKG unremarkable per EMS.  Pt a x 4 on arrival, emotional and crying.  Pt NAD.

## 2015-05-29 NOTE — ED Notes (Signed)
MD at bedside. 

## 2015-05-29 NOTE — ED Notes (Signed)
Patient transported to CT 

## 2015-05-29 NOTE — ED Notes (Signed)
Pt brady down into 30s, EKG completed, MD at bedside assessing pt.  Pt asymptomatic.

## 2015-05-29 NOTE — H&P (Signed)
[facility] History and Physical  [name] FOY:[id number] DOB: [birth year] DOA: 05/29/2015  Referring physician: er PCP: Scarlette Calico, MD   Chief Complaint: AMS  HPI: Christine Knight is a 64 y.o. female  With PMhx of obesity, DM, CAD.   She was hospitalized in May with acute on chronic systolic and diastolic HF.  She recently followed up with her PCP for a 2 month cough- placed on azithromax and ACE was stopped.  She presents to the ER today with slurred speech x 2 days and weakness.  When EMS arrived, her blood sugars were low.  Symptoms of slurred speech resolved with the blood sugar treatment.  +SOB with exertion, no fever, no chills, + cough.  Decreased appetite.  No dysuria  In the ER, she was found to be hypoglycemia- this was corrected.  She also was found to have en elevated troponin.  Her Cr was worsened from her baseline.  Her urine had LE.  Her x ray showed pulm edema.  She was initially given fluid in ER but changed to lasix when x ray back Cath briefly discussed with patient by cardiology at bedside but so far patient is resistant.    Hospitalist were called to assist with admission.     Review of Systems:  All systems reviewed, negative unless stated above   Past Medical History  Diagnosis Date  . Obesity, morbid   . Chronic systolic heart failure     Chronic systolic CHF  . Hyperlipidemia 08/10/11  . Diabetes mellitus type 2 in obese 08/10/11  . CAD (coronary artery disease) 08/2011    NSTEMI with subsequent CABG 08/13/2011 with LIMA-LAD, SVG-diagonal, SVG-OM, SVG-PDA  . Ischemic cardiomyopathy 08/13/2011    EF 20% in 08/2011, still 25% 10/21/2011  . Pleural effusion     Requiring L thoracentesis 09/02/11  . Mitral regurgitation     Mild by echo 10/21/11  . Hypertension   . Pulmonary hypertension     Echo, September, 2013, 72 mmHg.  Marland Kitchen Ejection fraction < 50%     EF 20%, October, 2012  //  EF 25% December, 2012  //   EF 35%, echo, September, 2013  .  Myocardial infarction 08/2011  . Orthopnoea     "progressively worse over last 3 wks" (03/17/2013)  . CHF (congestive heart failure)   . Carpal tunnel syndrome on both sides     "post OHS in 08/2011; resolved now" (03/17/2013)   Past Surgical History  Procedure Laterality Date  . Laparoscopy  1980's?    "removed gallstones; not gallbladder" (03/17/2013)  . Coronary artery bypass graft  08/13/11    CABG x4 with LIMA to LAD, SVG to Diag, SVG to OM, SVG to PDA, EVH via both thighs   Social History:  reports that she quit smoking about 36 years ago. Her smoking use included Cigarettes. She has never used smokeless tobacco. She reports that she drinks alcohol. She reports that she does not use illicit drugs.  Allergies  Allergen Reactions  . Lisinopril Cough    cough    Family History  Problem Relation Age of Onset  . Heart disease Father   . Cancer Neg Hx   . Diabetes Neg Hx   . Hypertension Neg Hx   . Kidney disease Neg Hx      Prior to Admission medications   Medication Sig Start Date End Date Taking? Authorizing Provider  acetaminophen (TYLENOL) 325 MG tablet Take 650 mg by mouth every 6 (six) hours as  needed for mild pain.   Yes Historical Provider, MD  aspirin 81 MG tablet Take 1 tablet (81 mg total) by mouth daily. 11/15/13  Yes Carlena Bjornstad, MD  Blood Glucose Monitoring Suppl (ONETOUCH VERIO) W/DEVICE KIT 1 Act by Does not apply route 3 (three) times daily. 10/12/14  Yes Janith Lima, MD  glipiZIDE (GLUCOTROL) 10 MG tablet Take 10 mg by mouth 2 (two) times daily. 02/20/15  Yes Historical Provider, MD  glucose blood test strip Use TID 10/12/14  Yes Janith Lima, MD  sitaGLIPtin-metformin (JANUMET) 50-1000 MG per tablet Take 1 tablet by mouth 2 (two) times daily with a meal. 05/17/15  Yes Janith Lima, MD  torsemide (DEMADEX) 20 MG tablet Take 2 tablets (40 mg total) by mouth 2 (two) times daily. 11/09/14  Yes Carlena Bjornstad, MD  Vitamin D, Ergocalciferol, (DRISDOL) 50000 UNITS  CAPS capsule Take 50,000 Units by mouth 2 (two) times a week.  04/27/15  Yes Historical Provider, MD  atorvastatin (LIPITOR) 10 MG tablet Take 1 tablet (10 mg total) by mouth daily. Patient not taking: Reported on 05/29/2015 12/12/14   Carlena Bjornstad, MD  promethazine-dextromethorphan (PROMETHAZINE-DM) 6.25-15 MG/5ML syrup Take 5 mLs by mouth 4 (four) times daily as needed for cough. Patient not taking: Reported on 05/29/2015 05/16/15   Janith Lima, MD   Physical Exam: Filed Vitals:   05/29/15 1631 05/29/15 1645 05/29/15 1715 05/29/15 1730  BP:  118/73 115/71   Pulse:  61  68  Temp: 98.7 F (37.1 C)     TempSrc: Oral     Resp:  13  24  Height:      Weight:      SpO2:  95%  96%    Wt Readings from Last 3 Encounters:  05/29/15 127.007 kg (280 lb)  05/16/15 128.822 kg (284 lb)  05/12/15 127.824 kg (281 lb 12.8 oz)    General:  uncomfortable appearing Eyes: PERRL, normal lids, irises & conjunctiva ENT: grossly normal hearing, lips & tongue Neck: no LAD, masses or thyromegaly Cardiovascular: RRR, no m/r/g. + edema Respiratory: diminished Abdomen: soft, ntnd- obese Skin: no rash or induration seen on limited exam Musculoskeletal: grossly normal tone BUE/BLE Psychiatric: grossly normal mood and affect, speech fluent and appropriate Neurologic: grossly non-focal.          Labs on Admission:  Basic Metabolic Panel:  Recent Labs Lab 05/29/15 1131  NA 128*  K 4.5  CL 91*  CO2 26  GLUCOSE 42*  BUN 54*  CREATININE 1.50*  CALCIUM 9.1   Liver Function Tests: No results for input(s): AST, ALT, ALKPHOS, BILITOT, PROT, ALBUMIN in the last 168 hours. No results for input(s): LIPASE, AMYLASE in the last 168 hours. No results for input(s): AMMONIA in the last 168 hours. CBC:  Recent Labs Lab 05/29/15 1131  WBC 5.2  HGB 14.6  HCT 42.2  MCV 86.7  PLT 173   Cardiac Enzymes:  Recent Labs Lab 05/29/15 1608  TROPONINI 2.81*    BNP (last 3 results)  Recent Labs   03/13/15 1135 05/29/15 1608  BNP 750.3* 921.7*    ProBNP (last 3 results) No results for input(s): PROBNP in the last 8760 hours.  CBG:  Recent Labs Lab 05/29/15 1237 05/29/15 1531  GLUCAP 141* 73    Radiological Exams on Admission: Dg Chest 2 View  05/29/2015   CLINICAL DATA:  Cough, congestion, wheezing and fever today.  EXAM: CHEST  2 VIEW  COMPARISON:  May 16, 2015  FINDINGS: The heart size and mediastinal contours are stable. The heart size is enlarged. Patient status post prior CABG and median sternotomy. There is pulmonary edema. There is no focal pneumonia. There is no pleural effusion. The visualized skeletal structures are stable.  IMPRESSION: Congestive heart failure.   Electronically Signed   By: Abelardo Diesel M.D.   On: 05/29/2015 14:50   Ct Head Wo Contrast  05/29/2015   CLINICAL DATA:  Generalized weakness for 1 week, slurred speech.  EXAM: CT HEAD WITHOUT CONTRAST  TECHNIQUE: Contiguous axial images were obtained from the base of the skull through the vertex without intravenous contrast.  COMPARISON:  MRI 12/19/2013  FINDINGS: No acute intracranial abnormality. Specifically, no hemorrhage, hydrocephalus, mass lesion, acute infarction, or significant intracranial injury. No acute calvarial abnormality. Visualized paranasal sinuses and mastoids clear. Orbital soft tissues unremarkable.  IMPRESSION: Negative.   Electronically Signed   By: Rolm Baptise M.D.   On: 05/29/2015 12:14    EKG: Independently reviewed. Low voltage with PVCs  Assessment/Plan Active Problems:   Obesity, morbid-BMI 45   Type 2 diabetes mellitus, uncontrolled   Ischemic cardiomyopathy   Cough   CHF (congestive heart failure)   NSTEMI (non-ST elevated myocardial infarction)   Acute on chronic systolic CHF (congestive heart failure)   Hypoglycemia   Hyponatremia   NSTEMI- defer to cards ASA Echo -cycle troponin  Uncontrolled DM -SSI -carb mod diet HgbA1C: 11.2 -hold PO  meds  AKI -suspect from volume overload  UTI? -await culture but abx given before -continue rocephin for now  pulm edema with known CHF- EF 30% -IV Lasix -cards consult -BNP elevated  Obesity  Hypoglycemia -SSI -monitor  Hyponatremia -suspect fluid overload   cardiology Dr. Ron Parker at bedside with me  Code Status: full DVT Prophylaxis: Family Communication: family at bedside Disposition Plan:   Time spent: 27 min  Eulogio Bear [facility] Pager [phone]

## 2015-05-29 NOTE — ED Notes (Signed)
Patient transported to X-ray 

## 2015-05-30 ENCOUNTER — Ambulatory Visit: Payer: BLUE CROSS/BLUE SHIELD | Admitting: Cardiology

## 2015-05-30 DIAGNOSIS — R05 Cough: Secondary | ICD-10-CM

## 2015-05-30 DIAGNOSIS — L8990 Pressure ulcer of unspecified site, unspecified stage: Secondary | ICD-10-CM | POA: Diagnosis present

## 2015-05-30 DIAGNOSIS — E871 Hypo-osmolality and hyponatremia: Secondary | ICD-10-CM

## 2015-05-30 DIAGNOSIS — I214 Non-ST elevation (NSTEMI) myocardial infarction: Secondary | ICD-10-CM

## 2015-05-30 DIAGNOSIS — E162 Hypoglycemia, unspecified: Secondary | ICD-10-CM

## 2015-05-30 DIAGNOSIS — I255 Ischemic cardiomyopathy: Secondary | ICD-10-CM

## 2015-05-30 LAB — BASIC METABOLIC PANEL
Anion gap: 12 (ref 5–15)
BUN: 52 mg/dL — ABNORMAL HIGH (ref 6–20)
CALCIUM: 8.9 mg/dL (ref 8.9–10.3)
CO2: 26 mmol/L (ref 22–32)
CREATININE: 1.37 mg/dL — AB (ref 0.44–1.00)
Chloride: 91 mmol/L — ABNORMAL LOW (ref 101–111)
GFR calc Af Amer: 46 mL/min — ABNORMAL LOW (ref >60)
GFR, EST NON AFRICAN AMERICAN: 40 mL/min — AB (ref >60)
GLUCOSE: 166 mg/dL — AB (ref 65–99)
POTASSIUM: 4.4 mmol/L (ref 3.5–5.1)
Sodium: 129 mmol/L — ABNORMAL LOW (ref 135–145)

## 2015-05-30 LAB — HEMOGLOBIN A1C
HEMOGLOBIN A1C: 8 % — AB (ref 4.8–5.6)
MEAN PLASMA GLUCOSE: 183 mg/dL

## 2015-05-30 LAB — CBC
HCT: 43.9 % (ref 36.0–46.0)
Hemoglobin: 15.3 g/dL — ABNORMAL HIGH (ref 12.0–15.0)
MCH: 30.7 pg (ref 26.0–34.0)
MCHC: 34.9 g/dL (ref 30.0–36.0)
MCV: 88 fL (ref 78.0–100.0)
Platelets: 182 10*3/uL (ref 150–400)
RBC: 4.99 MIL/uL (ref 3.87–5.11)
RDW: 14.5 % (ref 11.5–15.5)
WBC: 4.7 10*3/uL (ref 4.0–10.5)

## 2015-05-30 LAB — GLUCOSE, CAPILLARY
GLUCOSE-CAPILLARY: 285 mg/dL — AB (ref 65–99)
Glucose-Capillary: 270 mg/dL — ABNORMAL HIGH (ref 65–99)
Glucose-Capillary: 226 mg/dL — ABNORMAL HIGH (ref 65–99)

## 2015-05-30 LAB — TROPONIN I
TROPONIN I: 1.45 ng/mL — AB (ref <0.031)
Troponin I: 1.56 ng/mL (ref <0.031)

## 2015-05-30 MED ORDER — INSULIN GLARGINE 100 UNIT/ML ~~LOC~~ SOLN
10.0000 [IU] | Freq: Every day | SUBCUTANEOUS | Status: DC
  Administered 2015-05-30: 10 [IU] via SUBCUTANEOUS
  Filled 2015-05-30 (×2): qty 0.1

## 2015-05-30 MED ORDER — INSULIN GLARGINE 100 UNIT/ML ~~LOC~~ SOLN: 5.0000 [IU] | Freq: Every day | SUBCUTANEOUS | Status: DC

## 2015-05-30 MED ORDER — CLOPIDOGREL BISULFATE 75 MG PO TABS
75.0000 mg | ORAL_TABLET | Freq: Every day | ORAL | Status: DC
  Administered 2015-05-30 – 2015-06-10 (×12): 75 mg via ORAL
  Filled 2015-05-30 (×12): qty 1

## 2015-05-30 NOTE — Progress Notes (Deleted)
Discussed with Dr. Joseph ArtWoods if we can advance her diet but claimed that pt still complaining of abdominal pain.

## 2015-05-30 NOTE — Progress Notes (Signed)
Inpatient Diabetes Program Recommendations  AACE/ADA: New Consensus Statement on Inpatient Glycemic Control (2013)  Target Ranges:  Prepandial:   less than 140 mg/dL      Peak postprandial:   less than 180 mg/dL (1-2 hours)      Critically ill patients:  140 - 180 mg/dL   Results for [name], Ryver (MRN [id number]) as of 05/30/2015 12:32  Ref. Range 05/29/2015 12:37 05/29/2015 15:31 05/29/2015 18:54 05/29/2015 21:23 05/30/2015 08:26 05/30/2015 11:55  Glucose-Capillary Latest Ref Range: 65-99 mg/dL 409141 (H) 73 811127 (H) 914146 (H) 285 (H) 270 (H)    Diabetes history: DM2 Outpatient Diabetes medications: Per pt she was only taking Janumet 50-1000 mg BID Current orders for Inpatient glycemic control: Novolog 0-15 units TID with meals, Novolog 0-5 units HS, Lantus 5 units QHS (just ordered)  Inpatient Diabetes Program Recommendations Insulin - Basal: Please consider increasing Lantus to 10 units QHS.  Note: In reviewing the chart, noted patient has Glipzide 10 mg BID and Janumet 50-1000 mg BID listed on home medication list as outpatient diabetes medications. Spoke with patient and she states that she was no longer taking the Glipizide. She states that her PCP just recently (05/17/15) started her on Janumet and that was the only diabetes medication she was taking prior to coming to the hospital. Also noted in the chart review that patient was hospitalized in May 2016 and when she was discharged on May 9, she was prescribed Lantus 15 units daily and asked to stop taking the Glipizide. Patient reports that she never started taking the Lantus after discharge.  At this time recommend ordering Lantus 10 units QHS. Will continue to follow and make further recommendations as more data is collected.  Thanks, Orlando PennerMarie Thorin Starner, RN, MSN, CCRN, CDE Diabetes Coordinator Inpatient Diabetes Program [phone] (Team Pager from 8am to 5pm) [phone] ([facility] office) [phone] [facility] office) [phone] [facility] office)

## 2015-05-30 NOTE — Progress Notes (Signed)
DAILY PROGRESS NOTE  Subjective:  Feels better today. Still coughing. Significant hypoglycemia with AMS yesterday. Troponin peaked to 2.81 and is declining. She is hyponatremic, probably hypervolemic hypernatremia.  Much clearer today than described in the notes. 1.2L negative overnight. Sodium is slowing rising.  Objective:  Temp:  [98 F (36.7 C)-101 F (38.3 C)] 101 F (38.3 C) (07/18 2300) Pulse Rate:  [59-96] 78 (07/19 0735) Resp:  [13-32] 25 (07/19 0735) BP: (104-135)/(55-98) 135/66 mmHg (07/19 0735) SpO2:  [90 %-100 %] 95 % (07/19 0735) Weight:  [272 lb 11.3 oz (123.7 kg)-280 lb (127.007 kg)] 272 lb 11.3 oz (123.7 kg) (07/18 2009) Weight change:   Intake/Output from previous day: 07/18 0701 - 07/19 0700 In: 120 [P.O.:120] Out: 1350 [Urine:1350]  Intake/Output from this shift:    Medications: Current Facility-Administered Medications  Medication Dose Route Frequency Provider Last Rate Last Dose  . 0.9 %  sodium chloride infusion   Intravenous STAT Mancel Bale, MD      . 0.9 %  sodium chloride infusion  250 mL Intravenous PRN Joseph Art, DO      . acetaminophen (TYLENOL) tablet 650 mg  650 mg Oral Q6H PRN Joseph Art, DO      . aspirin EC tablet 81 mg  81 mg Oral Daily Joseph Art, DO   81 mg at 05/29/15 2000  . cefTRIAXone (ROCEPHIN) 1 g in dextrose 5 % 50 mL IVPB - Premix  1 g Intravenous Q24H Joseph Art, DO   1 g at 05/29/15 2000  . furosemide (LASIX) injection 40 mg  40 mg Intravenous BID Joseph Art, DO   40 mg at 05/29/15 2000  . heparin injection 5,000 Units  5,000 Units Subcutaneous 3 times per day Joseph Art, DO   5,000 Units at 05/29/15 2329  . insulin aspart (novoLOG) injection 0-15 Units  0-15 Units Subcutaneous TID WC Jessica U Vann, DO      . insulin aspart (novoLOG) injection 0-5 Units  0-5 Units Subcutaneous QHS Joseph Art, DO   0 Units at 05/29/15 2324  . sodium chloride 0.9 % injection 3 mL  3 mL Intravenous Q12H Jessica U  Vann, DO      . sodium chloride 0.9 % injection 3 mL  3 mL Intravenous Q12H Jessica U Vann, DO      . sodium chloride 0.9 % injection 3 mL  3 mL Intravenous PRN Joseph Art, DO        Physical Exam: General appearance: alert, no distress and morbidly obese Neck: JVD - 5 cm above sternal notch and no carotid bruit Lungs: diminished breath sounds bilaterally and rhonchi bilaterally Heart: regular rate and rhythm and occasional missed beats Abdomen: soft, non-tender; bowel sounds normal; no masses,  no organomegaly and obese Extremities: edema trace bilateral pedal edema Pulses: 2+ and symmetric Skin: Skin color, texture, turgor normal. No rashes or lesions Neurologic: Mental status: Alert, oriented, thought content appropriate Psych: Pleasant  Lab Results: Results for orders placed or performed during the hospital encounter of 05/29/15 (from the past 48 hour(s))  Basic metabolic panel     Status: Abnormal   Collection Time: 05/29/15 11:31 AM  Result Value Ref Range   Sodium 128 (L) 135 - 145 mmol/L   Potassium 4.5 3.5 - 5.1 mmol/L   Chloride 91 (L) 101 - 111 mmol/L   CO2 26 22 - 32 mmol/L   Glucose, Bld 42 (LL) 65 - 99 mg/dL  Comment: REPEATED TO VERIFY CRITICAL RESULT CALLED TO, READ BACK BY AND VERIFIED WITH: H.SHELTON,RN 05/29/15 1209 BY BSLADE    BUN 54 (H) 6 - 20 mg/dL   Creatinine, Ser 1.50 (H) 0.44 - 1.00 mg/dL   Calcium 9.1 8.9 - 10.3 mg/dL   GFR calc non Af Amer 36 (L) >60 mL/min   GFR calc Af Amer 42 (L) >60 mL/min    Comment: (NOTE) The eGFR has been calculated using the CKD EPI equation. This calculation has not been validated in all clinical situations. eGFR's persistently <60 mL/min signify possible Chronic Kidney Disease.    Anion gap 11 5 - 15  CBC     Status: None   Collection Time: 05/29/15 11:31 AM  Result Value Ref Range   WBC 5.2 4.0 - 10.5 K/uL   RBC 4.87 3.87 - 5.11 MIL/uL   Hemoglobin 14.6 12.0 - 15.0 g/dL   HCT 42.2 36.0 - 46.0 %   MCV 86.7  78.0 - 100.0 fL   MCH 30.0 26.0 - 34.0 pg   MCHC 34.6 30.0 - 36.0 g/dL   RDW 14.4 11.5 - 15.5 %   Platelets 173 150 - 400 K/uL  Hemoglobin A1c     Status: Abnormal   Collection Time: 05/29/15 11:31 AM  Result Value Ref Range   Hgb A1c MFr Bld 8.0 (H) 4.8 - 5.6 %    Comment: (NOTE)         Pre-diabetes: 5.7 - 6.4         Diabetes: >6.4         Glycemic control for adults with diabetes: <7.0    Mean Plasma Glucose 183 mg/dL    Comment: (NOTE) Performed At: [facility] [place], [place] [id number] Lindon Romp MD GY:[id number]   CBG monitoring, ED     Status: Abnormal   Collection Time: 05/29/15 12:37 PM  Result Value Ref Range   Glucose-Capillary 141 (H) 65 - 99 mg/dL  Urinalysis, Routine w reflex microscopic (not at [facility])     Status: Abnormal   Collection Time: 05/29/15  2:14 PM  Result Value Ref Range   Color, Urine YELLOW YELLOW   APPearance CLEAR CLEAR   Specific Gravity, Urine 1.015 1.005 - 1.030   pH 5.0 5.0 - 8.0   Glucose, UA NEGATIVE NEGATIVE mg/dL   Hgb urine dipstick MODERATE (A) NEGATIVE   Bilirubin Urine NEGATIVE NEGATIVE   Ketones, ur NEGATIVE NEGATIVE mg/dL   Protein, ur 30 (A) NEGATIVE mg/dL   Urobilinogen, UA 1.0 0.0 - 1.0 mg/dL   Nitrite NEGATIVE NEGATIVE   Leukocytes, UA MODERATE (A) NEGATIVE  Urine microscopic-add on     Status: Abnormal   Collection Time: 05/29/15  2:14 PM  Result Value Ref Range   Squamous Epithelial / LPF FEW (A) RARE   WBC, UA 7-10 <3 WBC/hpf   RBC / HPF 3-6 <3 RBC/hpf   Bacteria, UA FEW (A) RARE   Crystals CA OXALATE CRYSTALS (A) NEGATIVE  CBG monitoring, ED     Status: None   Collection Time: 05/29/15  3:31 PM  Result Value Ref Range   Glucose-Capillary 73 65 - 99 mg/dL  Troponin I     Status: Abnormal   Collection Time: 05/29/15  4:08 PM  Result Value Ref Range   Troponin I 2.81 (HH) <0.031 ng/mL    Comment:        POSSIBLE MYOCARDIAL ISCHEMIA. SERIAL TESTING RECOMMENDED. REPEATED TO  VERIFY CRITICAL RESULT CALLED TO,  READ BACK BY AND VERIFIED WITH: EISNER,M RN $[email or web address]'@[email or web address]$  BY GRINSTEAD,C 7.18.16   Brain natriuretic peptide     Status: Abnormal   Collection Time: 05/29/15  4:08 PM  Result Value Ref Range   B Natriuretic Peptide 921.7 (H) 0.0 - 100.0 pg/mL  CBG monitoring, ED     Status: Abnormal   Collection Time: 05/29/15  6:54 PM  Result Value Ref Range   Glucose-Capillary 127 (H) 65 - 99 mg/dL  MRSA PCR Screening     Status: None   Collection Time: 05/29/15  7:20 PM  Result Value Ref Range   MRSA by PCR NEGATIVE NEGATIVE    Comment:        The GeneXpert MRSA Assay (FDA approved for NASAL specimens only), is one component of a comprehensive MRSA colonization surveillance program. It is not intended to diagnose MRSA infection nor to guide or monitor treatment for MRSA infections.   Troponin I     Status: Abnormal   Collection Time: 05/29/15  8:40 PM  Result Value Ref Range   Troponin I 1.98 (HH) <0.031 ng/mL    Comment:        POSSIBLE MYOCARDIAL ISCHEMIA. SERIAL TESTING RECOMMENDED. REPEATED TO VERIFY CRITICAL VALUE NOTED.  VALUE IS CONSISTENT WITH PREVIOUSLY REPORTED AND CALLED VALUE.   Glucose, capillary     Status: Abnormal   Collection Time: 05/29/15  9:23 PM  Result Value Ref Range   Glucose-Capillary 146 (H) 65 - 99 mg/dL   Comment 1 Notify RN   Basic metabolic panel     Status: Abnormal   Collection Time: 05/30/15 12:39 AM  Result Value Ref Range   Sodium 129 (L) 135 - 145 mmol/L   Potassium 4.4 3.5 - 5.1 mmol/L   Chloride 91 (L) 101 - 111 mmol/L   CO2 26 22 - 32 mmol/L   Glucose, Bld 166 (H) 65 - 99 mg/dL   BUN 52 (H) 6 - 20 mg/dL   Creatinine, Ser 1.37 (H) 0.44 - 1.00 mg/dL   Calcium 8.9 8.9 - 10.3 mg/dL   GFR calc non Af Amer 40 (L) >60 mL/min   GFR calc Af Amer 46 (L) >60 mL/min    Comment: (NOTE) The eGFR has been calculated using the CKD EPI equation. This calculation has not been validated in all clinical situations. eGFR's  persistently <60 mL/min signify possible Chronic Kidney Disease.    Anion gap 12 5 - 15  CBC     Status: Abnormal   Collection Time: 05/30/15 12:39 AM  Result Value Ref Range   WBC 4.7 4.0 - 10.5 K/uL   RBC 4.99 3.87 - 5.11 MIL/uL   Hemoglobin 15.3 (H) 12.0 - 15.0 g/dL   HCT 43.9 36.0 - 46.0 %   MCV 88.0 78.0 - 100.0 fL   MCH 30.7 26.0 - 34.0 pg   MCHC 34.9 30.0 - 36.0 g/dL   RDW 14.5 11.5 - 15.5 %   Platelets 182 150 - 400 K/uL  Troponin I     Status: Abnormal   Collection Time: 05/30/15 12:39 AM  Result Value Ref Range   Troponin I 1.56 (HH) <0.031 ng/mL    Comment:        POSSIBLE MYOCARDIAL ISCHEMIA. SERIAL TESTING RECOMMENDED. REPEATED TO VERIFY CRITICAL VALUE NOTED.  VALUE IS CONSISTENT WITH PREVIOUSLY REPORTED AND CALLED VALUE.     Imaging: Dg Chest 2 View  05/29/2015   CLINICAL DATA:  Cough, congestion, wheezing and fever today.  EXAM: CHEST  2  VIEW  COMPARISON:  May 16, 2015  FINDINGS: The heart size and mediastinal contours are stable. The heart size is enlarged. Patient status post prior CABG and median sternotomy. There is pulmonary edema. There is no focal pneumonia. There is no pleural effusion. The visualized skeletal structures are stable.  IMPRESSION: Congestive heart failure.   Electronically Signed   By: Abelardo Diesel M.D.   On: 05/29/2015 14:50   Ct Head Wo Contrast  05/29/2015   CLINICAL DATA:  Generalized weakness for 1 week, slurred speech.  EXAM: CT HEAD WITHOUT CONTRAST  TECHNIQUE: Contiguous axial images were obtained from the base of the skull through the vertex without intravenous contrast.  COMPARISON:  MRI 12/19/2013  FINDINGS: No acute intracranial abnormality. Specifically, no hemorrhage, hydrocephalus, mass lesion, acute infarction, or significant intracranial injury. No acute calvarial abnormality. Visualized paranasal sinuses and mastoids clear. Orbital soft tissues unremarkable.  IMPRESSION: Negative.   Electronically Signed   By: Rolm Baptise  M.D.   On: 05/29/2015 12:14    Assessment:  1. Active Problems: 2.   Obesity, morbid-BMI 45 3.   Type 2 diabetes mellitus, uncontrolled 4.   Ischemic cardiomyopathy 5.   Cough 6.   CHF (congestive heart failure) 7.   NSTEMI (non-ST elevated myocardial infarction) 8.   Acute on chronic systolic CHF (congestive heart failure) 9.   Hypoglycemia 10.   Hyponatremia 11.   Pressure ulcer 12.   Plan:  1. Significant volume overload with hypervolemic hyponatremia - seems to be slowly improving with diuresis. Continue diuresis today - lasix 40 mg IV BID. With downtrending troponin, would not recommend IV heparin at this point. Suspect this is demand ischemia. On aspirin 81 mg daily. Would benefit from DAPT given troponin elevation. Start plavix 75 mg daily. Will need repeat right and left heart catheterization, per Dr. Kae Heller note, later this week when she is more volume optimized and sodium is near normal.  Time Spent Directly with Patient:  15 minutes  Length of Stay:  LOS: 1 day   Pixie Casino, MD, [facility] Attending Cardiologist [place] 05/30/2015, 8:18 AM

## 2015-05-30 NOTE — Care Management Note (Signed)
Case Management Note  Patient Details  Name: Christine Knight MRN: [id number] Date of Birth: [birth year]  Subjective/Objective:    Adm w heart failure , nstemi                Action/Plan: lives w fam, pcp dr Sanda Lingerthomas jones   Expected Discharge Date:                  Expected Discharge Plan:     In-House Referral:     Discharge planning Services     Post Acute Care Choice:    Choice offered to:     DME Arranged:    DME Agency:     HH Arranged:    HH Agency:     Status of Service:     Medicare Important Message Given:    Date Medicare IM Given:    Medicare IM give by:    Date Additional Medicare IM Given:    Additional Medicare Important Message give by:     If discussed at Long Length of Stay Meetings, dates discussed:    Additional Comments: ur review done  Hanley HaysDowell, Collie Wernick T, RN 05/30/2015, 9:10 AM

## 2015-05-30 NOTE — Progress Notes (Signed)
PROGRESS NOTE  Chryl Holten WUJ:[id number] DOB: Jan 16, 1951 DOA: 05/29/2015 PCP: Sanda Linger, MD  Assessment/Plan: NSTEMI- defer to cards ASA Echo - troponin trending down  Uncontrolled DM -SSI- add lantus -carb mod diet HgbA1C: 11.2 -hold PO meds  AKI -suspect from volume overload  Fever with UTI? -await culture but abx given before -continue rocephin for now -add blood cultures  pulm edema with known CHF- EF 30% -IV Lasix -cards consult -BNP elevated  Obesity  Hypoglycemia -SSI -monitor  Hyponatremia -suspect fluid overload   Code Status: full Family Communication: patient Disposition Plan: leave in SDU for now   Consultants:  cards  Procedures:      HPI/Subjective: Fever overnight Breathing better  Objective: Filed Vitals:   05/30/15 1150  BP: 115/66  Pulse: 73  Temp: 100.3 F (37.9 C)  Resp: 27    Intake/Output Summary (Last 24 hours) at 05/30/15 1227 Last data filed at 05/30/15 0900  Gross per 24 hour  Intake    320 ml  Output   1750 ml  Net  -1430 ml   Filed Weights   05/29/15 1109 05/29/15 2009  Weight: 127.007 kg (280 lb) 123.7 kg (272 lb 11.3 oz)    Exam:   General:  Awake, no increased work of breathing  Cardiovascular: rrr  Respiratory: diminshed  Abdomen: +Bs, soft  Musculoskeletal: min LE edema   Data Reviewed: Basic Metabolic Panel:  Recent Labs Lab 05/29/15 1131 05/30/15 0039  NA 128* 129*  K 4.5 4.4  CL 91* 91*  CO2 26 26  GLUCOSE 42* 166*  BUN 54* 52*  CREATININE 1.50* 1.37*  CALCIUM 9.1 8.9   Liver Function Tests: No results for input(s): AST, ALT, ALKPHOS, BILITOT, PROT, ALBUMIN in the last 168 hours. No results for input(s): LIPASE, AMYLASE in the last 168 hours. No results for input(s): AMMONIA in the last 168 hours. CBC:  Recent Labs Lab 05/29/15 1131 05/30/15 0039  WBC 5.2 4.7  HGB 14.6 15.3*  HCT 42.2 43.9  MCV 86.7 88.0  PLT 173 182   Cardiac Enzymes:  Recent  Labs Lab 05/29/15 1608 05/29/15 2040 05/30/15 0039 05/30/15 0705  TROPONINI 2.81* 1.98* 1.56* 1.45*   BNP (last 3 results)  Recent Labs  03/13/15 1135 05/29/15 1608  BNP 750.3* 921.7*    ProBNP (last 3 results) No results for input(s): PROBNP in the last 8760 hours.  CBG:  Recent Labs Lab 05/29/15 1237 05/29/15 1531 05/29/15 1854 05/29/15 2123 05/30/15 0826  GLUCAP 141* 73 127* 146* 285*    Recent Results (from the past 240 hour(s))  Culture, Urine     Status: None (Preliminary result)   Collection Time: 05/29/15  2:04 PM  Result Value Ref Range Status   Specimen Description URINE, CLEAN CATCH  Final   Special Requests NONE  Final   Culture NO GROWTH < 24 HOURS  Final   Report Status PENDING  Incomplete  MRSA PCR Screening     Status: None   Collection Time: 05/29/15  7:20 PM  Result Value Ref Range Status   MRSA by PCR NEGATIVE NEGATIVE Final    Comment:        The GeneXpert MRSA Assay (FDA approved for NASAL specimens only), is one component of a comprehensive MRSA colonization surveillance program. It is not intended to diagnose MRSA infection nor to guide or monitor treatment for MRSA infections.      Studies: Dg Chest 2 View  05/29/2015   CLINICAL DATA:  Cough, congestion, wheezing  and fever today.  EXAM: CHEST  2 VIEW  COMPARISON:  May 16, 2015  FINDINGS: The heart size and mediastinal contours are stable. The heart size is enlarged. Patient status post prior CABG and median sternotomy. There is pulmonary edema. There is no focal pneumonia. There is no pleural effusion. The visualized skeletal structures are stable.  IMPRESSION: Congestive heart failure.   Electronically Signed   By: Sherian ReinWei-Chen  Lin M.D.   On: 05/29/2015 14:50   Ct Head Wo Contrast  05/29/2015   CLINICAL DATA:  Generalized weakness for 1 week, slurred speech.  EXAM: CT HEAD WITHOUT CONTRAST  TECHNIQUE: Contiguous axial images were obtained from the base of the skull through the vertex  without intravenous contrast.  COMPARISON:  MRI 12/19/2013  FINDINGS: No acute intracranial abnormality. Specifically, no hemorrhage, hydrocephalus, mass lesion, acute infarction, or significant intracranial injury. No acute calvarial abnormality. Visualized paranasal sinuses and mastoids clear. Orbital soft tissues unremarkable.  IMPRESSION: Negative.   Electronically Signed   By: Charlett NoseKevin  Dover M.D.   On: 05/29/2015 12:14    Scheduled Meds: . sodium chloride   Intravenous STAT  . aspirin  81 mg Oral Daily  . cefTRIAXone (ROCEPHIN)  IV  1 g Intravenous Q24H  . clopidogrel  75 mg Oral Daily  . furosemide  40 mg Intravenous BID  . heparin  5,000 Units Subcutaneous 3 times per day  . insulin aspart  0-15 Units Subcutaneous TID WC  . insulin aspart  0-5 Units Subcutaneous QHS  . sodium chloride  3 mL Intravenous Q12H  . sodium chloride  3 mL Intravenous Q12H   Continuous Infusions:  Antibiotics Given (last 72 hours)    None      Active Problems:   Obesity, morbid-BMI 45   Type 2 diabetes mellitus, uncontrolled   Ischemic cardiomyopathy   Cough   CHF (congestive heart failure)   NSTEMI (non-ST elevated myocardial infarction)   Acute on chronic systolic CHF (congestive heart failure)   Hypoglycemia   Hyponatremia   Pressure ulcer    Time spent: 35 min    Kelechi Astarita  [facility] Pager [phone]. If 7PM-7AM, please contact night-coverage at [email or web address], password [email or web address] 05/30/2015, 12:27 PM  LOS: 1 day

## 2015-05-31 DIAGNOSIS — N289 Disorder of kidney and ureter, unspecified: Secondary | ICD-10-CM

## 2015-05-31 DIAGNOSIS — E1165 Type 2 diabetes mellitus with hyperglycemia: Secondary | ICD-10-CM

## 2015-05-31 LAB — COMPREHENSIVE METABOLIC PANEL
ALBUMIN: 3.1 g/dL — AB (ref 3.5–5.0)
ALK PHOS: 142 U/L — AB (ref 38–126)
ALT: 70 U/L — ABNORMAL HIGH (ref 14–54)
ANION GAP: 13 (ref 5–15)
AST: 92 U/L — ABNORMAL HIGH (ref 15–41)
BUN: 47 mg/dL — AB (ref 6–20)
CALCIUM: 9 mg/dL (ref 8.9–10.3)
CO2: 23 mmol/L (ref 22–32)
Chloride: 92 mmol/L — ABNORMAL LOW (ref 101–111)
Creatinine, Ser: 1.08 mg/dL — ABNORMAL HIGH (ref 0.44–1.00)
GFR, EST NON AFRICAN AMERICAN: 53 mL/min — AB (ref >60)
GLUCOSE: 264 mg/dL — AB (ref 65–99)
Potassium: 4.7 mmol/L (ref 3.5–5.1)
Sodium: 128 mmol/L — ABNORMAL LOW (ref 135–145)
Total Bilirubin: 1.4 mg/dL — ABNORMAL HIGH (ref 0.3–1.2)
Total Protein: 7.6 g/dL (ref 6.5–8.1)

## 2015-05-31 LAB — CBC
HEMATOCRIT: 42.6 % (ref 36.0–46.0)
HEMOGLOBIN: 14.9 g/dL (ref 12.0–15.0)
MCH: 30 pg (ref 26.0–34.0)
MCHC: 35 g/dL (ref 30.0–36.0)
MCV: 85.9 fL (ref 78.0–100.0)
PLATELETS: 160 10*3/uL (ref 150–400)
RBC: 4.96 MIL/uL (ref 3.87–5.11)
RDW: 14.5 % (ref 11.5–15.5)
WBC: 5.1 10*3/uL (ref 4.0–10.5)

## 2015-05-31 LAB — URINE CULTURE

## 2015-05-31 LAB — GLUCOSE, CAPILLARY
GLUCOSE-CAPILLARY: 294 mg/dL — AB (ref 65–99)
Glucose-Capillary: 228 mg/dL — ABNORMAL HIGH (ref 65–99)
Glucose-Capillary: 217 mg/dL — ABNORMAL HIGH (ref 65–99)
Glucose-Capillary: 309 mg/dL — ABNORMAL HIGH (ref 65–99)
Glucose-Capillary: 287 mg/dL — ABNORMAL HIGH (ref 65–99)

## 2015-05-31 MED ORDER — LEVOFLOXACIN 750 MG PO TABS
750.0000 mg | ORAL_TABLET | Freq: Every day | ORAL | Status: AC
  Administered 2015-05-31 – 2015-06-04 (×5): 750 mg via ORAL
  Filled 2015-05-31 (×5): qty 1

## 2015-05-31 MED ORDER — ATORVASTATIN CALCIUM 80 MG PO TABS
80.0000 mg | ORAL_TABLET | Freq: Every day | ORAL | Status: DC
  Administered 2015-05-31 – 2015-06-09 (×10): 80 mg via ORAL
  Filled 2015-05-31 (×11): qty 1

## 2015-05-31 MED ORDER — GUAIFENESIN-CODEINE 100-10 MG/5ML PO SOLN
10.0000 mL | ORAL | Status: DC | PRN
  Administered 2015-05-31 – 2015-06-02 (×6): 10 mL via ORAL
  Filled 2015-05-31 (×6): qty 10

## 2015-05-31 MED ORDER — INSULIN GLARGINE 100 UNIT/ML ~~LOC~~ SOLN
13.0000 [IU] | Freq: Every day | SUBCUTANEOUS | Status: DC
  Administered 2015-05-31: 13 [IU] via SUBCUTANEOUS
  Filled 2015-05-31 (×2): qty 0.13

## 2015-05-31 NOTE — Progress Notes (Signed)
ANTIBIOTIC CONSULT NOTE - INITIAL  Pharmacy Consult for Levofloxacin Indication: rule out pneumonia / UTI  Allergies  Allergen Reactions  . Lisinopril Cough    cough    Patient Measurements: Height: 5\' 7"  (170.2 cm) Weight: 270 lb 1 oz (122.5 kg) IBW/kg (Calculated) : 61.6   Vital Signs: Temp: 99 F (37.2 C) (07/20 0720) Temp Source: Oral (07/20 0418) BP: 153/82 mmHg (07/20 0720) Pulse Rate: 69 (07/20 0720) Intake/Output from previous day: 07/19 0701 - 07/20 0700 In: 880 [P.O.:830; IV Piggyback:50] Out: 1275 [Urine:1275] Intake/Output from this shift:    Labs:  Recent Labs  05/29/15 1131 05/30/15 0039 05/31/15 0914  WBC 5.2 4.7 5.1  HGB 14.6 15.3* 14.9  PLT 173 182 160  CREATININE 1.50* 1.37* 1.08*   Estimated Creatinine Clearance: 72.4 mL/min (by C-G formula based on Cr of 1.08). No results for input(s): VANCOTROUGH, VANCOPEAK, VANCORANDOM, GENTTROUGH, GENTPEAK, GENTRANDOM, TOBRATROUGH, TOBRAPEAK, TOBRARND, AMIKACINPEAK, AMIKACINTROU, AMIKACIN in the last 72 hours.   Microbiology: Recent Results (from the past 720 hour(s))  Culture, Urine     Status: None   Collection Time: 05/29/15  2:04 PM  Result Value Ref Range Status   Specimen Description URINE, CLEAN CATCH  Final   Special Requests NONE  Final   Culture   Final    MULTIPLE SPECIES PRESENT, SUGGEST RECOLLECTION IF CLINICALLY INDICATED   Report Status 05/31/2015 FINAL  Final  MRSA PCR Screening     Status: None   Collection Time: 05/29/15  7:20 PM  Result Value Ref Range Status   MRSA by PCR NEGATIVE NEGATIVE Final    Comment:        The GeneXpert MRSA Assay (FDA approved for NASAL specimens only), is one component of a comprehensive MRSA colonization surveillance program. It is not intended to diagnose MRSA infection nor to guide or monitor treatment for MRSA infections.     Medical History: Past Medical History  Diagnosis Date  . Obesity, morbid   . Chronic systolic heart failure     Chronic systolic CHF  . Hyperlipidemia 08/10/11  . Diabetes mellitus type 2 in obese 08/10/11  . CAD (coronary artery disease) 08/2011    NSTEMI with subsequent CABG 08/13/2011 with LIMA-LAD, SVG-diagonal, SVG-OM, SVG-PDA  . Ischemic cardiomyopathy 08/13/2011    EF 20% in 08/2011, still 25% 10/21/2011  . Pleural effusion     Requiring L thoracentesis 09/02/11  . Mitral regurgitation     Mild by echo 10/21/11  . Hypertension   . Pulmonary hypertension     Echo, September, 2013, 72 mmHg.  Marland Kitchen. Ejection fraction < 50%     EF 20%, October, 2012  //  EF 25% December, 2012  //   EF 35%, echo, September, 2013  . Myocardial infarction 08/2011  . Orthopnoea     "progressively worse over last 3 wks" (03/17/2013)  . CHF (congestive heart failure)   . Carpal tunnel syndrome on both sides     "post OHS in 08/2011; resolved now" (03/17/2013)     Assessment: 63yof admitted with SOB and diuresed 1.5L with IV furosemide.  SOB and cough - continued.  Spiked low grade fever 100.5, WBC wnl, UA + leukocytes and few bacteria  - UCX in process.  Plan to treat for r/O pneumonia and UTI.  QTc 350     Plan:  Levofloxacin 750gm po q24 x 5  Leota SauersLisa Nayomi Tabron Pharm.D. CPP, BCPS Clinical Pharmacist [phone] 05/31/2015 10:44 AM

## 2015-05-31 NOTE — Progress Notes (Addendum)
DAILY PROGRESS NOTE  Subjective:  Feels better today. Creatinine and sodium improving with diuresis. Weight is down 9 lbs. -1.6L negative.  Persistent cough, making it difficult to sleep at night.  Objective:  Temp:  [97.7 F (36.5 C)-100.4 F (38 C)] 99.3 F (37.4 C) (07/20 0418) Pulse Rate:  [58-74] 69 (07/20 0720) Resp:  [23-29] 29 (07/20 0720) BP: (101-153)/(57-87) 153/82 mmHg (07/20 0720) SpO2:  [90 %-99 %] 98 % (07/20 0720) Weight:  [270 lb 1 oz (122.5 kg)] 270 lb 1 oz (122.5 kg) (07/20 0500) Weight change: -9 lb 15 oz (-4.507 kg)  Intake/Output from previous day: 07/19 0701 - 07/20 0700 In: 880 [P.O.:830; IV Piggyback:50] Out: 1275 [Urine:1275]  Intake/Output from this shift:    Medications: Current Facility-Administered Medications  Medication Dose Route Frequency Provider Last Rate Last Dose  . 0.9 %  sodium chloride infusion  250 mL Intravenous PRN Joseph Art, DO 0 mL/hr at 05/30/15 1941 250 mL at 05/30/15 1941  . acetaminophen (TYLENOL) tablet 650 mg  650 mg Oral Q6H PRN Joseph Art, DO   650 mg at 05/30/15 1726  . aspirin EC tablet 81 mg  81 mg Oral Daily Joseph Art, DO   81 mg at 05/30/15 0913  . cefTRIAXone (ROCEPHIN) 1 g in dextrose 5 % 50 mL IVPB - Premix  1 g Intravenous Q24H Joseph Art, DO   1 g at 05/30/15 1941  . clopidogrel (PLAVIX) tablet 75 mg  75 mg Oral Daily Chrystie Nose, MD   75 mg at 05/30/15 0913  . furosemide (LASIX) injection 40 mg  40 mg Intravenous BID Joseph Art, DO   40 mg at 05/30/15 1716  . heparin injection 5,000 Units  5,000 Units Subcutaneous 3 times per day Joseph Art, DO   5,000 Units at 05/31/15 0631  . insulin aspart (novoLOG) injection 0-15 Units  0-15 Units Subcutaneous TID WC Joseph Art, DO   5 Units at 05/30/15 1723  . insulin aspart (novoLOG) injection 0-5 Units  0-5 Units Subcutaneous QHS Joseph Art, DO   2 Units at 05/30/15 2139  . insulin glargine (LANTUS) injection 10 Units  10 Units  Subcutaneous QHS Joseph Art, DO   10 Units at 05/30/15 2138  . sodium chloride 0.9 % injection 3 mL  3 mL Intravenous Q12H Joseph Art, DO   3 mL at 05/30/15 2144  . sodium chloride 0.9 % injection 3 mL  3 mL Intravenous Q12H Joseph Art, DO   3 mL at 05/30/15 2144  . sodium chloride 0.9 % injection 3 mL  3 mL Intravenous PRN Joseph Art, DO        Physical Exam: General appearance: alert, no distress and morbidly obese Neck: JVD - 3 cm above sternal notch and no carotid bruit Lungs: diminished breath sounds bilaterally and rhonchi bilaterally Heart: regular rate and rhythm and occasional missed beats Abdomen: soft, non-tender; bowel sounds normal; no masses,  no organomegaly and obese Extremities: edema trace bilateral pedal edema Pulses: 2+ and symmetric Skin: Skin color, texture, turgor normal. No rashes or lesions Neurologic: Mental status: Alert, oriented, thought content appropriate Psych: Pleasant  Lab Results: Results for orders placed or performed during the hospital encounter of 05/29/15 (from the past 48 hour(s))  Basic metabolic panel     Status: Abnormal   Collection Time: 05/29/15 11:31 AM  Result Value Ref Range   Sodium 128 (L) 135 -  145 mmol/L   Potassium 4.5 3.5 - 5.1 mmol/L   Chloride 91 (L) 101 - 111 mmol/L   CO2 26 22 - 32 mmol/L   Glucose, Bld 42 (LL) 65 - 99 mg/dL    Comment: REPEATED TO VERIFY CRITICAL RESULT CALLED TO, READ BACK BY AND VERIFIED WITH: H.SHELTON,RN 05/29/15 1209 BY BSLADE    BUN 54 (H) 6 - 20 mg/dL   Creatinine, Ser 1.50 (H) 0.44 - 1.00 mg/dL   Calcium 9.1 8.9 - 10.3 mg/dL   GFR calc non Af Amer 36 (L) >60 mL/min   GFR calc Af Amer 42 (L) >60 mL/min    Comment: (NOTE) The eGFR has been calculated using the CKD EPI equation. This calculation has not been validated in all clinical situations. eGFR's persistently <60 mL/min signify possible Chronic Kidney Disease.    Anion gap 11 5 - 15  CBC     Status: None   Collection  Time: 05/29/15 11:31 AM  Result Value Ref Range   WBC 5.2 4.0 - 10.5 K/uL   RBC 4.87 3.87 - 5.11 MIL/uL   Hemoglobin 14.6 12.0 - 15.0 g/dL   HCT 42.2 36.0 - 46.0 %   MCV 86.7 78.0 - 100.0 fL   MCH 30.0 26.0 - 34.0 pg   MCHC 34.6 30.0 - 36.0 g/dL   RDW 14.4 11.5 - 15.5 %   Platelets 173 150 - 400 K/uL  Hemoglobin A1c     Status: Abnormal   Collection Time: 05/29/15 11:31 AM  Result Value Ref Range   Hgb A1c MFr Bld 8.0 (H) 4.8 - 5.6 %    Comment: (NOTE)         Pre-diabetes: 5.7 - 6.4         Diabetes: >6.4         Glycemic control for adults with diabetes: <7.0    Mean Plasma Glucose 183 mg/dL    Comment: (NOTE) Performed At: [facility] [place], [place] [id number] Lindon Romp MD [id number]   CBG monitoring, ED     Status: Abnormal   Collection Time: 05/29/15 12:37 PM  Result Value Ref Range   Glucose-Capillary 141 (H) 65 - 99 mg/dL  Culture, Urine     Status: None (Preliminary result)   Collection Time: 05/29/15  2:04 PM  Result Value Ref Range   Specimen Description URINE, CLEAN CATCH    Special Requests NONE    Culture NO GROWTH < 24 HOURS    Report Status PENDING   Urinalysis, Routine w reflex microscopic (not at [facility])     Status: Abnormal   Collection Time: 05/29/15  2:14 PM  Result Value Ref Range   Color, Urine YELLOW YELLOW   APPearance CLEAR CLEAR   Specific Gravity, Urine 1.015 1.005 - 1.030   pH 5.0 5.0 - 8.0   Glucose, UA NEGATIVE NEGATIVE mg/dL   Hgb urine dipstick MODERATE (A) NEGATIVE   Bilirubin Urine NEGATIVE NEGATIVE   Ketones, ur NEGATIVE NEGATIVE mg/dL   Protein, ur 30 (A) NEGATIVE mg/dL   Urobilinogen, UA 1.0 0.0 - 1.0 mg/dL   Nitrite NEGATIVE NEGATIVE   Leukocytes, UA MODERATE (A) NEGATIVE  Urine microscopic-add on     Status: Abnormal   Collection Time: 05/29/15  2:14 PM  Result Value Ref Range   Squamous Epithelial / LPF FEW (A) RARE   WBC, UA 7-10 <3 WBC/hpf   RBC / HPF 3-6 <3 RBC/hpf   Bacteria, UA FEW  (A) RARE  Crystals CA OXALATE CRYSTALS (A) NEGATIVE  CBG monitoring, ED     Status: None   Collection Time: 05/29/15  3:31 PM  Result Value Ref Range   Glucose-Capillary 73 65 - 99 mg/dL  Troponin I     Status: Abnormal   Collection Time: 05/29/15  4:08 PM  Result Value Ref Range   Troponin I 2.81 (HH) <0.031 ng/mL    Comment:        POSSIBLE MYOCARDIAL ISCHEMIA. SERIAL TESTING RECOMMENDED. REPEATED TO VERIFY CRITICAL RESULT CALLED TO, READ BACK BY AND VERIFIED WITH: EISNER,M RN $[email or web address]$  BY GRINSTEAD,C 7.18.16   Brain natriuretic peptide     Status: Abnormal   Collection Time: 05/29/15  4:08 PM  Result Value Ref Range   B Natriuretic Peptide 921.7 (H) 0.0 - 100.0 pg/mL  CBG monitoring, ED     Status: Abnormal   Collection Time: 05/29/15  6:54 PM  Result Value Ref Range   Glucose-Capillary 127 (H) 65 - 99 mg/dL  MRSA PCR Screening     Status: None   Collection Time: 05/29/15  7:20 PM  Result Value Ref Range   MRSA by PCR NEGATIVE NEGATIVE    Comment:        The GeneXpert MRSA Assay (FDA approved for NASAL specimens only), is one component of a comprehensive MRSA colonization surveillance program. It is not intended to diagnose MRSA infection nor to guide or monitor treatment for MRSA infections.   Troponin I     Status: Abnormal   Collection Time: 05/29/15  8:40 PM  Result Value Ref Range   Troponin I 1.98 (HH) <0.031 ng/mL    Comment:        POSSIBLE MYOCARDIAL ISCHEMIA. SERIAL TESTING RECOMMENDED. REPEATED TO VERIFY CRITICAL VALUE NOTED.  VALUE IS CONSISTENT WITH PREVIOUSLY REPORTED AND CALLED VALUE.   Glucose, capillary     Status: Abnormal   Collection Time: 05/29/15  9:23 PM  Result Value Ref Range   Glucose-Capillary 146 (H) 65 - 99 mg/dL   Comment 1 Notify RN   Basic metabolic panel     Status: Abnormal   Collection Time: 05/30/15 12:39 AM  Result Value Ref Range   Sodium 129 (L) 135 - 145 mmol/L   Potassium 4.4 3.5 - 5.1 mmol/L   Chloride 91 (L) 101  - 111 mmol/L   CO2 26 22 - 32 mmol/L   Glucose, Bld 166 (H) 65 - 99 mg/dL   BUN 52 (H) 6 - 20 mg/dL   Creatinine, Ser 1.37 (H) 0.44 - 1.00 mg/dL   Calcium 8.9 8.9 - 10.3 mg/dL   GFR calc non Af Amer 40 (L) >60 mL/min   GFR calc Af Amer 46 (L) >60 mL/min    Comment: (NOTE) The eGFR has been calculated using the CKD EPI equation. This calculation has not been validated in all clinical situations. eGFR's persistently <60 mL/min signify possible Chronic Kidney Disease.    Anion gap 12 5 - 15  CBC     Status: Abnormal   Collection Time: 05/30/15 12:39 AM  Result Value Ref Range   WBC 4.7 4.0 - 10.5 K/uL   RBC 4.99 3.87 - 5.11 MIL/uL   Hemoglobin 15.3 (H) 12.0 - 15.0 g/dL   HCT 43.9 36.0 - 46.0 %   MCV 88.0 78.0 - 100.0 fL   MCH 30.7 26.0 - 34.0 pg   MCHC 34.9 30.0 - 36.0 g/dL   RDW 14.5 11.5 - 15.5 %   Platelets 182 150 -  400 K/uL  Troponin I     Status: Abnormal   Collection Time: 05/30/15 12:39 AM  Result Value Ref Range   Troponin I 1.56 (HH) <0.031 ng/mL    Comment:        POSSIBLE MYOCARDIAL ISCHEMIA. SERIAL TESTING RECOMMENDED. REPEATED TO VERIFY CRITICAL VALUE NOTED.  VALUE IS CONSISTENT WITH PREVIOUSLY REPORTED AND CALLED VALUE.   Troponin I     Status: Abnormal   Collection Time: 05/30/15  7:05 AM  Result Value Ref Range   Troponin I 1.45 (HH) <0.031 ng/mL    Comment:        POSSIBLE MYOCARDIAL ISCHEMIA. SERIAL TESTING RECOMMENDED. REPEATED TO VERIFY CRITICAL VALUE NOTED.  VALUE IS CONSISTENT WITH PREVIOUSLY REPORTED AND CALLED VALUE.   Glucose, capillary     Status: Abnormal   Collection Time: 05/30/15  8:26 AM  Result Value Ref Range   Glucose-Capillary 285 (H) 65 - 99 mg/dL   Comment 1 Capillary Specimen   Glucose, capillary     Status: Abnormal   Collection Time: 05/30/15 11:55 AM  Result Value Ref Range   Glucose-Capillary 270 (H) 65 - 99 mg/dL  Glucose, capillary     Status: Abnormal   Collection Time: 05/30/15  5:19 PM  Result Value Ref Range    Glucose-Capillary 226 (H) 65 - 99 mg/dL  Glucose, capillary     Status: Abnormal   Collection Time: 05/30/15  9:39 PM  Result Value Ref Range   Glucose-Capillary 228 (H) 65 - 99 mg/dL   Comment 1 Capillary Specimen     Imaging: Dg Chest 2 View  05/29/2015   CLINICAL DATA:  Cough, congestion, wheezing and fever today.  EXAM: CHEST  2 VIEW  COMPARISON:  May 16, 2015  FINDINGS: The heart size and mediastinal contours are stable. The heart size is enlarged. Patient status post prior CABG and median sternotomy. There is pulmonary edema. There is no focal pneumonia. There is no pleural effusion. The visualized skeletal structures are stable.  IMPRESSION: Congestive heart failure.   Electronically Signed   By: Abelardo Diesel M.D.   On: 05/29/2015 14:50   Ct Head Wo Contrast  05/29/2015   CLINICAL DATA:  Generalized weakness for 1 week, slurred speech.  EXAM: CT HEAD WITHOUT CONTRAST  TECHNIQUE: Contiguous axial images were obtained from the base of the skull through the vertex without intravenous contrast.  COMPARISON:  MRI 12/19/2013  FINDINGS: No acute intracranial abnormality. Specifically, no hemorrhage, hydrocephalus, mass lesion, acute infarction, or significant intracranial injury. No acute calvarial abnormality. Visualized paranasal sinuses and mastoids clear. Orbital soft tissues unremarkable.  IMPRESSION: Negative.   Electronically Signed   By: Rolm Baptise M.D.   On: 05/29/2015 12:14    Assessment:  Active Problems:   Obesity, morbid-BMI 45   Type 2 diabetes mellitus, uncontrolled   Ischemic cardiomyopathy   Cough   CHF (congestive heart failure)   NSTEMI (non-ST elevated myocardial infarction)   Acute on chronic systolic CHF (congestive heart failure)   Hypoglycemia   Hyponatremia   Pressure ulcer   Renal insufficiency   Plan:  Continue diuresis. On DAPT. Will add high dose atorvastatin 80 mg, check FLP tomorrow. Will add codeine cough syrup and IS for bronchopneumonia. Will  need heart catheterization later this week. When this was presented to her she said repeatedly "we'll see".  I think she lacks insight into how important it is for [place] to understand the mechanism of her CHF and troponin elevation in getting her  better. Will continue to address this. Tentatively plan for [facility] and possible PCI Friday at 10:30.  Time Spent Directly with Patient:  15 minutes  Length of Stay:  LOS: 2 days   Pixie Casino, MD, [facility] Attending Cardiologist [place] 05/31/2015, 8:07 AM

## 2015-05-31 NOTE — Progress Notes (Signed)
PROGRESS NOTE  Christine BabeBarbara Knight ZOX:[id number] DOB: 1951/05/19 DOA: 05/29/2015 PCP: Sanda Lingerhomas Jones, MD  Assessment/Plan: NSTEMI- defer to cards ASA/plavix Echo - troponin trending down -FLP- check CMP and FLP in AM- start high dose statin -cath planned for Friday if allowed by patient  Uncontrolled DM -SSI- add lantus 10 units -carb mod diet HgbA1C: 11.2 -hold PO meds  AKI -suspect from volume overload  Fever with UTI? -await culture but abx given before -change to levaquin to cover PNA as well--- was on azithromycin as outpatient -add blood cultures  pulm edema with known CHF- EF 30% -IV Lasix- weight down -cards consult -BNP elevated  Obesity  Hypoglycemia -SSI -monitor  Hyponatremia -suspect fluid overload   Code Status: full Family Communication: patient- did not want me to call family Disposition Plan: leave in SDU for now   Consultants:  cards  Procedures:      HPI/Subjective: Hesitant to have cath- not sure of why says  " I just want to feel better" O2 is drying out nose  Objective: Filed Vitals:   05/31/15 0720  BP: 153/82  Pulse: 69  Temp:   Resp: 29    Intake/Output Summary (Last 24 hours) at 05/31/15 0800 Last data filed at 05/31/15 0600  Gross per 24 hour  Intake    880 ml  Output   1275 ml  Net   -395 ml   Filed Weights   05/29/15 1109 05/29/15 2009 05/31/15 0500  Weight: 127.007 kg (280 lb) 123.7 kg (272 lb 11.3 oz) 122.5 kg (270 lb 1 oz)    Exam:   General:  Awake, no increased work of breathing  Cardiovascular: rrr  Respiratory: diminshed  Abdomen: +Bs, soft  Musculoskeletal: min LE edema   Data Reviewed: Basic Metabolic Panel:  Recent Labs Lab 05/29/15 1131 05/30/15 0039  NA 128* 129*  K 4.5 4.4  CL 91* 91*  CO2 26 26  GLUCOSE 42* 166*  BUN 54* 52*  CREATININE 1.50* 1.37*  CALCIUM 9.1 8.9   Liver Function Tests: No results for input(s): AST, ALT, ALKPHOS, BILITOT, PROT, ALBUMIN in the last  168 hours. No results for input(s): LIPASE, AMYLASE in the last 168 hours. No results for input(s): AMMONIA in the last 168 hours. CBC:  Recent Labs Lab 05/29/15 1131 05/30/15 0039  WBC 5.2 4.7  HGB 14.6 15.3*  HCT 42.2 43.9  MCV 86.7 88.0  PLT 173 182   Cardiac Enzymes:  Recent Labs Lab 05/29/15 1608 05/29/15 2040 05/30/15 0039 05/30/15 0705  TROPONINI 2.81* 1.98* 1.56* 1.45*   BNP (last 3 results)  Recent Labs  03/13/15 1135 05/29/15 1608  BNP 750.3* 921.7*    ProBNP (last 3 results) No results for input(s): PROBNP in the last 8760 hours.  CBG:  Recent Labs Lab 05/29/15 2123 05/30/15 0826 05/30/15 1155 05/30/15 1719 05/30/15 2139  GLUCAP 146* 285* 270* 226* 228*    Recent Results (from the past 240 hour(s))  Culture, Urine     Status: None (Preliminary result)   Collection Time: 05/29/15  2:04 PM  Result Value Ref Range Status   Specimen Description URINE, CLEAN CATCH  Final   Special Requests NONE  Final   Culture NO GROWTH < 24 HOURS  Final   Report Status PENDING  Incomplete  MRSA PCR Screening     Status: None   Collection Time: 05/29/15  7:20 PM  Result Value Ref Range Status   MRSA by PCR NEGATIVE NEGATIVE Final    Comment:  The GeneXpert MRSA Assay (FDA approved for NASAL specimens only), is one component of a comprehensive MRSA colonization surveillance program. It is not intended to diagnose MRSA infection nor to guide or monitor treatment for MRSA infections.      Studies: Dg Chest 2 View  05/29/2015   CLINICAL DATA:  Cough, congestion, wheezing and fever today.  EXAM: CHEST  2 VIEW  COMPARISON:  May 16, 2015  FINDINGS: The heart size and mediastinal contours are stable. The heart size is enlarged. Patient status post prior CABG and median sternotomy. There is pulmonary edema. There is no focal pneumonia. There is no pleural effusion. The visualized skeletal structures are stable.  IMPRESSION: Congestive heart failure.    Electronically Signed   By: Sherian Rein M.D.   On: 05/29/2015 14:50   Ct Head Wo Contrast  05/29/2015   CLINICAL DATA:  Generalized weakness for 1 week, slurred speech.  EXAM: CT HEAD WITHOUT CONTRAST  TECHNIQUE: Contiguous axial images were obtained from the base of the skull through the vertex without intravenous contrast.  COMPARISON:  MRI 12/19/2013  FINDINGS: No acute intracranial abnormality. Specifically, no hemorrhage, hydrocephalus, mass lesion, acute infarction, or significant intracranial injury. No acute calvarial abnormality. Visualized paranasal sinuses and mastoids clear. Orbital soft tissues unremarkable.  IMPRESSION: Negative.   Electronically Signed   By: Charlett Nose M.D.   On: 05/29/2015 12:14    Scheduled Meds: . aspirin  81 mg Oral Daily  . cefTRIAXone (ROCEPHIN)  IV  1 g Intravenous Q24H  . clopidogrel  75 mg Oral Daily  . furosemide  40 mg Intravenous BID  . heparin  5,000 Units Subcutaneous 3 times per day  . insulin aspart  0-15 Units Subcutaneous TID WC  . insulin aspart  0-5 Units Subcutaneous QHS  . insulin glargine  10 Units Subcutaneous QHS  . sodium chloride  3 mL Intravenous Q12H  . sodium chloride  3 mL Intravenous Q12H   Continuous Infusions:  Antibiotics Given (last 72 hours)    Date/Time Action Medication Dose Rate   05/30/15 1941 Given   cefTRIAXone (ROCEPHIN) 1 g in dextrose 5 % 50 mL IVPB - Premix 1 g 100 mL/hr      Active Problems:   Obesity, morbid-BMI 45   Type 2 diabetes mellitus, uncontrolled   Ischemic cardiomyopathy   Cough   CHF (congestive heart failure)   NSTEMI (non-ST elevated myocardial infarction)   Acute on chronic systolic CHF (congestive heart failure)   Hypoglycemia   Hyponatremia   Pressure ulcer   Renal insufficiency    Time spent: 35 min    Brandy Zuba  [facility] Pager [phone]. If 7PM-7AM, please contact night-coverage at [email or web address], password [facility] 05/31/2015, 8:00 AM  LOS: 2 days

## 2015-05-31 NOTE — Progress Notes (Addendum)
Inpatient Diabetes Program Recommendations  AACE/ADA: New Consensus Statement on Inpatient Glycemic Control (2013)  Target Ranges:  Prepandial:   less than 140 mg/dL      Peak postprandial:   less than 180 mg/dL (1-2 hours)      Critically ill patients:  140 - 180 mg/dL   Results for Christine Knight, Christine Knight (MRN [id number]) as of 05/31/2015 10:07  Ref. Range 05/30/2015 08:26 05/30/2015 11:55 05/30/2015 17:19 05/30/2015 21:39 05/31/2015 07:38  Glucose-Capillary Latest Ref Range: 65-99 mg/dL 782285 (H) 956270 (H) 213226 (H) 228 (H) 217 (H)   Diabetes history: DM2 Outpatient Diabetes medications: Per pt she was only taking Janumet 50-1000 mg BID Current orders for Inpatient glycemic control: Novolog 0-15 units TID with meals, Novolog 0-5 units HS, Lantus 10 units QHS  Inpatient Diabetes Program Recommendations Insulin - Basal: Please consider increasing Lantus to 15 units QHS.  Thanks, Orlando PennerMarie Tyshun Tuckerman, RN, MSN, CCRN, CDE Diabetes Coordinator Inpatient Diabetes Program [phone] (Team Pager from 8am to 5pm) [phone] ([facility] office) [phone] [facility] office) [phone] [facility] office)

## 2015-05-31 NOTE — Consult Note (Addendum)
WOC wound consult note Reason for Consult: Consult requested for stage 2 wound to buttocks. Pt states this is an abrasion "where EMS dragged me across the hard board." Pt was admitted with decreased mental status; suspect she had an episode of incontinence and skin remained moist and she does not recall this occurring, then the partial thickness abrasion occurred. Pt states area is painful. Wound type: Appearance is consistent with moisture associated skin damage and partial thickness abrasion. Pressure Ulcer POA: This is NOT a pressure ulcer Measurement: Inner gluteal fold with 2 areas of partail thickness skin loss; 4X1 and 2X1cm on bilat sides.   Wound bed:  Affected areas are red and moist Drainage (amount, consistency, odor) Small amt yellow drainage, no odor Periwound: Intact skin surrounding. Dressing procedure/placement/frequency: Foam dressing in place to absorb drainage and promote healing.  Discussed plan of care with patient and she verbalizes understanding. Please re-consult if further assistance is needed.  Thank-you,  Cammie Mcgeeawn Geno Sydnor MSN, RN, CWOCN, PaulinaWCN-[facility], CNS [phone]

## 2015-05-31 NOTE — Progress Notes (Signed)
Managed to use the incentive spirometry, explained to her to use  It 10 times every hour.

## 2015-06-01 ENCOUNTER — Inpatient Hospital Stay (HOSPITAL_COMMUNITY): Payer: BLUE CROSS/BLUE SHIELD

## 2015-06-01 LAB — GLUCOSE, CAPILLARY
GLUCOSE-CAPILLARY: 361 mg/dL — AB (ref 65–99)
Glucose-Capillary: 265 mg/dL — ABNORMAL HIGH (ref 65–99)
Glucose-Capillary: 242 mg/dL — ABNORMAL HIGH (ref 65–99)
Glucose-Capillary: 203 mg/dL — ABNORMAL HIGH (ref 65–99)

## 2015-06-01 LAB — CBC
HEMATOCRIT: 39 % (ref 36.0–46.0)
HEMOGLOBIN: 13.1 g/dL (ref 12.0–15.0)
MCH: 29.7 pg (ref 26.0–34.0)
MCHC: 33.6 g/dL (ref 30.0–36.0)
MCV: 88.4 fL (ref 78.0–100.0)
PLATELETS: 227 10*3/uL (ref 150–400)
RBC: 4.41 MIL/uL (ref 3.87–5.11)
RDW: 14.4 % (ref 11.5–15.5)
WBC: 5 10*3/uL (ref 4.0–10.5)

## 2015-06-01 LAB — BASIC METABOLIC PANEL
Anion gap: 12 (ref 5–15)
BUN: 37 mg/dL — ABNORMAL HIGH (ref 6–20)
CO2: 27 mmol/L (ref 22–32)
CREATININE: 1.12 mg/dL — AB (ref 0.44–1.00)
Calcium: 8.8 mg/dL — ABNORMAL LOW (ref 8.9–10.3)
Chloride: 88 mmol/L — ABNORMAL LOW (ref 101–111)
GFR calc non Af Amer: 51 mL/min — ABNORMAL LOW (ref >60)
GFR, EST AFRICAN AMERICAN: 59 mL/min — AB (ref >60)
GLUCOSE: 374 mg/dL — AB (ref 65–99)
Potassium: 4.4 mmol/L (ref 3.5–5.1)
Sodium: 127 mmol/L — ABNORMAL LOW (ref 135–145)

## 2015-06-01 LAB — LIPID PANEL
CHOL/HDL RATIO: 6.8 ratio
CHOLESTEROL: 143 mg/dL (ref 0–200)
HDL: 21 mg/dL — ABNORMAL LOW (ref >40)
LDL CALC: 98 mg/dL (ref 0–99)
Triglycerides: 122 mg/dL (ref <150)
VLDL: 24 mg/dL (ref 0–40)

## 2015-06-01 MED ORDER — ALBUTEROL SULFATE (2.5 MG/3ML) 0.083% IN NEBU
2.5000 mg | INHALATION_SOLUTION | Freq: Four times a day (QID) | RESPIRATORY_TRACT | Status: DC
  Administered 2015-06-01 – 2015-06-02 (×6): 2.5 mg via RESPIRATORY_TRACT
  Filled 2015-06-01 (×6): qty 3

## 2015-06-01 MED ORDER — PERFLUTREN LIPID MICROSPHERE
INTRAVENOUS | Status: AC
  Filled 2015-06-01: qty 10

## 2015-06-01 MED ORDER — ALBUTEROL SULFATE HFA 108 (90 BASE) MCG/ACT IN AERS
2.0000 | INHALATION_SPRAY | Freq: Four times a day (QID) | RESPIRATORY_TRACT | Status: DC
Start: 2015-06-01 — End: 2015-06-01

## 2015-06-01 MED ORDER — PERFLUTREN LIPID MICROSPHERE: 1.0000 mL | INTRAVENOUS | Status: DC | PRN

## 2015-06-01 MED ORDER — INSULIN GLARGINE 100 UNIT/ML ~~LOC~~ SOLN
17.0000 [IU] | Freq: Every day | SUBCUTANEOUS | Status: DC
  Administered 2015-06-01: 17 [IU] via SUBCUTANEOUS
  Filled 2015-06-01 (×2): qty 0.17

## 2015-06-01 NOTE — Progress Notes (Signed)
DAILY PROGRESS NOTE  Subjective:  Diuresed additionally overnight - now out 2.5L. Creatinine improved. LDL is 98, TC 143. Sodium remains low. AST/ALT mildly elevated (<3x ULN).  Objective:  Temp:  [98.1 F (36.7 C)-99.8 F (37.7 C)] 98.2 F (36.8 C) (07/21 0718) Pulse Rate:  [63-72] 65 (07/21 0718) Resp:  [18-27] 27 (07/21 0718) BP: (103-127)/(56-81) 103/56 mmHg (07/21 0718) SpO2:  [92 %-99 %] 99 % (07/21 0718) Weight:  [272 lb 7.8 oz (123.6 kg)] 272 lb 7.8 oz (123.6 kg) (07/21 0341) Weight change: 2 lb 6.8 oz (1.1 kg)  Intake/Output from previous day: 07/20 0701 - 07/21 0700 In: 250 [P.O.:250] Out: 1150 [Urine:1150]  Intake/Output from this shift:    Medications: Current Facility-Administered Medications  Medication Dose Route Frequency Provider Last Rate Last Dose  . 0.9 %  sodium chloride infusion  250 mL Intravenous PRN Geradine Girt, DO 0 mL/hr at 05/30/15 1941 250 mL at 05/30/15 1941  . acetaminophen (TYLENOL) tablet 650 mg  650 mg Oral Q6H PRN Geradine Girt, DO   650 mg at 05/30/15 1726  . aspirin EC tablet 81 mg  81 mg Oral Daily Geradine Girt, DO   81 mg at 05/31/15 0857  . atorvastatin (LIPITOR) tablet 80 mg  80 mg Oral q1800 Pixie Casino, MD   80 mg at 05/31/15 1715  . clopidogrel (PLAVIX) tablet 75 mg  75 mg Oral Daily Pixie Casino, MD   75 mg at 05/31/15 0857  . furosemide (LASIX) injection 40 mg  40 mg Intravenous BID Geradine Girt, DO   40 mg at 06/01/15 0846  . guaiFENesin-codeine 100-10 MG/5ML solution 10 mL  10 mL Oral Q4H PRN Pixie Casino, MD   10 mL at 06/01/15 2505  . heparin injection 5,000 Units  5,000 Units Subcutaneous 3 times per day Geradine Girt, DO   5,000 Units at 06/01/15 3976  . insulin aspart (novoLOG) injection 0-15 Units  0-15 Units Subcutaneous TID WC Geradine Girt, DO   8 Units at 06/01/15 0846  . insulin aspart (novoLOG) injection 0-5 Units  0-5 Units Subcutaneous QHS Geradine Girt, DO   3 Units at 05/31/15 2204  .  insulin glargine (LANTUS) injection 13 Units  13 Units Subcutaneous QHS Geradine Girt, DO   13 Units at 05/31/15 2205  . levofloxacin (LEVAQUIN) tablet 750 mg  750 mg Oral Daily Geradine Girt, DO   750 mg at 05/31/15 1306  . sodium chloride 0.9 % injection 3 mL  3 mL Intravenous Q12H Jessica U Vann, DO   3 mL at 05/31/15 1311  . sodium chloride 0.9 % injection 3 mL  3 mL Intravenous Q12H Geradine Girt, DO   3 mL at 05/31/15 2205  . sodium chloride 0.9 % injection 3 mL  3 mL Intravenous PRN Geradine Girt, DO   3 mL at 05/31/15 7341    Physical Exam: General appearance: alert, no distress and morbidly obese Neck: JVD - 3 cm above sternal notch and no carotid bruit Lungs: diminished breath sounds bilaterally, rhonchi bilaterally and wheezes LUL and RUL Heart: regular rate and rhythm and occasional missed beats Abdomen: soft, non-tender; bowel sounds normal; no masses,  no organomegaly and obese Extremities: edema trace bilateral pedal edema Pulses: 2+ and symmetric Skin: Skin color, texture, turgor normal. No rashes or lesions Neurologic: Mental status: Alert, oriented, thought content appropriate Psych: Pleasant  Lab Results: Results for orders placed or  performed during the hospital encounter of 05/29/15 (from the past 48 hour(s))  Glucose, capillary     Status: Abnormal   Collection Time: 05/30/15 11:55 AM  Result Value Ref Range   Glucose-Capillary 270 (H) 65 - 99 mg/dL  Glucose, capillary     Status: Abnormal   Collection Time: 05/30/15  5:19 PM  Result Value Ref Range   Glucose-Capillary 226 (H) 65 - 99 mg/dL  Glucose, capillary     Status: Abnormal   Collection Time: 05/30/15  9:39 PM  Result Value Ref Range   Glucose-Capillary 228 (H) 65 - 99 mg/dL   Comment 1 Capillary Specimen   Glucose, capillary     Status: Abnormal   Collection Time: 05/31/15  7:38 AM  Result Value Ref Range   Glucose-Capillary 217 (H) 65 - 99 mg/dL   Comment 1 Notify RN   CBC     Status: None    Collection Time: 05/31/15  9:14 AM  Result Value Ref Range   WBC 5.1 4.0 - 10.5 K/uL   RBC 4.96 3.87 - 5.11 MIL/uL   Hemoglobin 14.9 12.0 - 15.0 g/dL   HCT 42.6 36.0 - 46.0 %   MCV 85.9 78.0 - 100.0 fL   MCH 30.0 26.0 - 34.0 pg   MCHC 35.0 30.0 - 36.0 g/dL   RDW 14.5 11.5 - 15.5 %   Platelets 160 150 - 400 K/uL  Comprehensive metabolic panel     Status: Abnormal   Collection Time: 05/31/15  9:14 AM  Result Value Ref Range   Sodium 128 (L) 135 - 145 mmol/L   Potassium 4.7 3.5 - 5.1 mmol/L   Chloride 92 (L) 101 - 111 mmol/L   CO2 23 22 - 32 mmol/L   Glucose, Bld 264 (H) 65 - 99 mg/dL   BUN 47 (H) 6 - 20 mg/dL   Creatinine, Ser 1.08 (H) 0.44 - 1.00 mg/dL   Calcium 9.0 8.9 - 10.3 mg/dL   Total Protein 7.6 6.5 - 8.1 g/dL   Albumin 3.1 (L) 3.5 - 5.0 g/dL   AST 92 (H) 15 - 41 U/L   ALT 70 (H) 14 - 54 U/L   Alkaline Phosphatase 142 (H) 38 - 126 U/L   Total Bilirubin 1.4 (H) 0.3 - 1.2 mg/dL   GFR calc non Af Amer 53 (L) >60 mL/min   GFR calc Af Amer >60 >60 mL/min    Comment: (NOTE) The eGFR has been calculated using the CKD EPI equation. This calculation has not been validated in all clinical situations. eGFR's persistently <60 mL/min signify possible Chronic Kidney Disease.    Anion gap 13 5 - 15  Glucose, capillary     Status: Abnormal   Collection Time: 05/31/15  1:00 PM  Result Value Ref Range   Glucose-Capillary 294 (H) 65 - 99 mg/dL   Comment 1 Notify RN   Glucose, capillary     Status: Abnormal   Collection Time: 05/31/15  5:08 PM  Result Value Ref Range   Glucose-Capillary 309 (H) 65 - 99 mg/dL  Glucose, capillary     Status: Abnormal   Collection Time: 05/31/15  9:27 PM  Result Value Ref Range   Glucose-Capillary 287 (H) 65 - 99 mg/dL   Comment 1 Capillary Specimen   Lipid panel     Status: Abnormal   Collection Time: 06/01/15  2:34 AM  Result Value Ref Range   Cholesterol 143 0 - 200 mg/dL   Triglycerides 122 <150 mg/dL  HDL 21 (L) >40 mg/dL   Total  CHOL/HDL Ratio 6.8 RATIO   VLDL 24 0 - 40 mg/dL   LDL Cholesterol 98 0 - 99 mg/dL    Comment:        Total Cholesterol/HDL:CHD Risk Coronary Heart Disease Risk Table                     Men   Women  1/2 Average Risk   3.4   3.3  Average Risk       5.0   4.4  2 X Average Risk   9.6   7.1  3 X Average Risk  23.4   11.0        Use the calculated Patient Ratio above and the CHD Risk Table to determine the patient's CHD Risk.        ATP III CLASSIFICATION (LDL):  <100     mg/dL   Optimal  100-129  mg/dL   Near or Above                    Optimal  130-159  mg/dL   Borderline  160-189  mg/dL   High  >190     mg/dL   Very High   Glucose, capillary     Status: Abnormal   Collection Time: 06/01/15  7:17 AM  Result Value Ref Range   Glucose-Capillary 265 (H) 65 - 99 mg/dL   Comment 1 Capillary Specimen     Imaging: Dg Chest Port 1 View  06/01/2015   CLINICAL DATA:  Cough congestion, and wheezing since May of 2016  EXAM: PORTABLE CHEST - 1 VIEW  COMPARISON:  Chest x-ray of May 29, 2015 and May 16, 2015.  FINDINGS: The lungs are adequately inflated. There remain increased interstitial densities bilaterally. These have slightly improved since the previous study. Pulmonary vascular congestion is also slightly decreased. The cardiac silhouette remains enlarged but its margins are more distinct. The patient has undergone previous CABG. There are 8 intact sternal wires. The mediastinum is normal in width. The bony thorax exhibits no acute abnormality.  IMPRESSION: Slight interval improvement in pulmonary interstitial edema secondary to CHF. There is no focal pneumonia.   Electronically Signed   By: David  Martinique M.D.   On: 06/01/2015 08:22    Assessment:  Active Problems:   Obesity, morbid-BMI 45   Type 2 diabetes mellitus, uncontrolled   Ischemic cardiomyopathy   Cough   CHF (congestive heart failure)   NSTEMI (non-ST elevated myocardial infarction)   Acute on chronic systolic CHF  (congestive heart failure)   Hypoglycemia   Hyponatremia   Pressure ulcer   Renal insufficiency   Plan:  Diuresis is slow - will increase lasix to 40 mg IV TID today. Decrease lipitor to 20 mg QHS due to elevated liver enzymes and LDL of 98. Patient is refusing heart catheterization- said today "please don't ask me again".  Will take her off the cath board for tomorrow. Add scheduled nebulizers per RT, still wheezy today.  Time Spent Directly with Patient:  15 minutes  Length of Stay:  LOS: 3 days   Pixie Casino, MD, [facility] Attending Cardiologist [place] 06/01/2015, 9:08 AM

## 2015-06-01 NOTE — Progress Notes (Signed)
Inpatient Diabetes Program Recommendations  AACE/ADA: New Consensus Statement on Inpatient Glycemic Control (2013)  Target Ranges:  Prepandial:   less than 140 mg/dL      Peak postprandial:   less than 180 mg/dL (1-2 hours)      Critically ill patients:  140 - 180 mg/dL   Results for Christine Knight, Christine Knight (MRN [id number]) as of 06/01/2015 08:24  Ref. Range 05/31/2015 07:38 05/31/2015 13:00 05/31/2015 17:08 05/31/2015 21:27 06/01/2015 07:17  Glucose-Capillary Latest Ref Range: 65-99 mg/dL 409 (H) 811 (H) 914 (H) 287 (H) 265 (H)   Diabetes history: DM2 Outpatient Diabetes medications: Per pt she was only taking Janumet 50-1000 mg BID Current orders for Inpatient glycemic control: Novolog 0-15 units TID with meals, Novolog 0-5 units HS, Lantus 13 units QHS  Inpatient Diabetes Program Recommendations Insulin - Basal: Please increase Lantus to 17 units QHS. Insulin - Meal Coverage: If patient is eating at least 50% of meals, please consider ordering Novolog 5 units TID with meals for meal coverage.  Thanks, Orlando Penner, RN, MSN, CCRN, CDE Diabetes Coordinator Inpatient Diabetes Program [phone] (Team Pager from 8am to 5pm) [phone] ([facility] office) [phone] [facility] office) [phone] [facility] office)

## 2015-06-01 NOTE — Progress Notes (Signed)
PROGRESS NOTE  Christine Knight ZOX:[id number] DOB: [birth year] DOA: 05/29/2015 PCP: Sanda Linger, MD  Ms. Marcoux is a 64 y.o.female. who presented the emergency room  with altered mental status. Her glucose was 40. This was treated in her mental status improved. However her chest x-ray showed pulmonary edema and her troponin was elevated. She has known coronary disease. She has significant left ventricular dysfunction.  Recently she was hospitalized with CHF. Right and left heart cath was recommended. The patient did not want this and she is still resistant to catherization   Assessment/Plan: NSTEMI- defer to cards ASA/plavix - troponin trending down -FLP- AST/ALT elevated, suspect due to congestion from heart failure - high dose statin-- trend liver enzymes -cath planned for Friday if allowed by patient  Uncontrolled DM -SSI- added lantus 10 units- adjust up -carb mod diet HgbA1C: 11.2 -hold PO meds  AKI -suspect from volume overload -decreasing with diuresis  Fever with UTI? -await culture but abx given before -change to levaquin to cover PNA as well--- was on azithromycin as outpatient -add blood cultures  pulm edema with known CHF- EF 30% -IV Lasix- weight down -cards consult -BNP elevated  Obesity  Hypoglycemia -SSI -monitor  Hyponatremia -suspect due to fluid overload   Code Status: full Family Communication: patient- did not want me to call family Disposition Plan: tx to tele?   Consultants:  cards  Procedures:      HPI/Subjective: Breathing still not improved  Objective: Filed Vitals:   06/01/15 0718  BP: 103/56  Pulse: 65  Temp: 98.2 F (36.8 C)  Resp: 27    Intake/Output Summary (Last 24 hours) at 06/01/15 0804 Last data filed at 06/01/15 0300  Gross per 24 hour  Intake    250 ml  Output   1150 ml  Net   -900 ml   Filed Weights   05/29/15 2009 05/31/15 0500 06/01/15 0341  Weight: 123.7 kg (272 lb 11.3 oz) 122.5 kg (270 lb 1  oz) 123.6 kg (272 lb 7.8 oz)    Exam:   General:  Awake, no increased work of breathing- on O2  Cardiovascular: rrr  Respiratory: diminished, mild wheezing  Abdomen: +Bs, soft  Musculoskeletal: b/l LE edema   Data Reviewed: Basic Metabolic Panel:  Recent Labs Lab 05/29/15 1131 05/30/15 0039 05/31/15 0914  NA 128* 129* 128*  K 4.5 4.4 4.7  CL 91* 91* 92*  CO2 GLUCOSE 42* 166* 264*  BUN 54* 52* 47*  CREATININE 1.50* 1.37* 1.08*  CALCIUM 9.1 8.9 9.0   Liver Function Tests:  Recent Labs Lab 05/31/15 0914  AST 92*  ALT 70*  ALKPHOS 142*  BILITOT 1.4*  PROT 7.6  ALBUMIN 3.1*   No results for input(s): LIPASE, AMYLASE in the last 168 hours. No results for input(s): AMMONIA in the last 168 hours. CBC:  Recent Labs Lab 05/29/15 1131 05/30/15 0039 05/31/15 0914  WBC 5.2 4.7 5.1  HGB 14.6 15.3* 14.9  HCT 42.2 43.9 42.6  MCV 86.7 88.0 85.9  PLT 173 182 160   Cardiac Enzymes:  Recent Labs Lab 05/29/15 1608 05/29/15 2040 05/30/15 0039 05/30/15 0705  TROPONINI 2.81* 1.98* 1.56* 1.45*   BNP (last 3 results)  Recent Labs  03/13/15 1135 05/29/15 1608  BNP 750.3* 921.7*    ProBNP (last 3 results) No results for input(s): PROBNP in the last 8760 hours.  CBG:  Recent Labs Lab 05/30/15 2139 05/31/15 0738 05/31/15 1300 05/31/15 1708 05/31/15 2127  GLUCAP 228*  217* 294* 309* 287*    Recent Results (from the past 240 hour(s))  Culture, Urine     Status: None   Collection Time: 05/29/15  2:04 PM  Result Value Ref Range Status   Specimen Description URINE, CLEAN CATCH  Final   Special Requests NONE  Final   Culture   Final    MULTIPLE SPECIES PRESENT, SUGGEST RECOLLECTION IF CLINICALLY INDICATED   Report Status 05/31/2015 FINAL  Final  MRSA PCR Screening     Status: None   Collection Time: 05/29/15  7:20 PM  Result Value Ref Range Status   MRSA by PCR NEGATIVE NEGATIVE Final    Comment:        The GeneXpert MRSA Assay  (FDA approved for NASAL specimens only), is one component of a comprehensive MRSA colonization surveillance program. It is not intended to diagnose MRSA infection nor to guide or monitor treatment for MRSA infections.   Culture, blood (routine x 2)     Status: None (Preliminary result)   Collection Time: 05/30/15  8:50 AM  Result Value Ref Range Status   Specimen Description BLOOD LEFT HAND  Final   Special Requests BOTTLES DRAWN AEROBIC ONLY  3CC  Final   Culture NO GROWTH 1 DAY  Final   Report Status PENDING  Incomplete     Studies: No results found.  Scheduled Meds: . aspirin  81 mg Oral Daily  . atorvastatin  80 mg Oral q1800  . clopidogrel  75 mg Oral Daily  . furosemide  40 mg Intravenous BID  . heparin  5,000 Units Subcutaneous 3 times per day  . insulin aspart  0-15 Units Subcutaneous TID WC  . insulin aspart  0-5 Units Subcutaneous QHS  . insulin glargine  13 Units Subcutaneous QHS  . levofloxacin  750 mg Oral Daily  . sodium chloride  3 mL Intravenous Q12H  . sodium chloride  3 mL Intravenous Q12H   Continuous Infusions:  Antibiotics Given (last 72 hours)    Date/Time Action Medication Dose Rate   05/30/15 1941 Given   cefTRIAXone (ROCEPHIN) 1 g in dextrose 5 % 50 mL IVPB - Premix 1 g 100 mL/hr   05/31/15 1306 Given   levofloxacin (LEVAQUIN) tablet 750 mg 750 mg       Active Problems:   Obesity, morbid-BMI 45   Type 2 diabetes mellitus, uncontrolled   Ischemic cardiomyopathy   Cough   CHF (congestive heart failure)   NSTEMI (non-ST elevated myocardial infarction)   Acute on chronic systolic CHF (congestive heart failure)   Hypoglycemia   Hyponatremia   Pressure ulcer   Renal insufficiency    Time spent: 35 min    Megin Consalvo  [facility] Pager [phone]. If 7PM-7AM, please contact night-coverage at [email or web address], password [email or web address] 06/01/2015, 8:04 AM  LOS: 3 days

## 2015-06-02 ENCOUNTER — Encounter (HOSPITAL_COMMUNITY)
Admission: EM | Disposition: A | Discharge status: Home / Self Care | Payer: Self-pay | Source: Home / Self Care | Attending: Internal Medicine

## 2015-06-02 DIAGNOSIS — M159 Polyosteoarthritis, unspecified: Secondary | ICD-10-CM

## 2015-06-02 LAB — CBC
HCT: 38 % (ref 36.0–46.0)
Hemoglobin: 12.8 g/dL (ref 12.0–15.0)
MCH: 29.8 pg (ref 26.0–34.0)
MCHC: 33.7 g/dL (ref 30.0–36.0)
MCV: 88.4 fL (ref 78.0–100.0)
Platelets: 247 10*3/uL (ref 150–400)
RBC: 4.3 MIL/uL (ref 3.87–5.11)
RDW: 14.6 % (ref 11.5–15.5)
WBC: 5.7 10*3/uL (ref 4.0–10.5)

## 2015-06-02 LAB — COMPREHENSIVE METABOLIC PANEL
ALBUMIN: 2.8 g/dL — AB (ref 3.5–5.0)
ALK PHOS: 141 U/L — AB (ref 38–126)
ALT: 56 U/L — AB (ref 14–54)
AST: 56 U/L — ABNORMAL HIGH (ref 15–41)
Anion gap: 10 (ref 5–15)
BILIRUBIN TOTAL: 1 mg/dL (ref 0.3–1.2)
BUN: 38 mg/dL — ABNORMAL HIGH (ref 6–20)
CHLORIDE: 91 mmol/L — AB (ref 101–111)
CO2: 29 mmol/L (ref 22–32)
Calcium: 9 mg/dL (ref 8.9–10.3)
Creatinine, Ser: 1.3 mg/dL — ABNORMAL HIGH (ref 0.44–1.00)
GFR calc non Af Amer: 43 mL/min — ABNORMAL LOW (ref >60)
GFR, EST AFRICAN AMERICAN: 50 mL/min — AB (ref >60)
GLUCOSE: 204 mg/dL — AB (ref 65–99)
POTASSIUM: 4.4 mmol/L (ref 3.5–5.1)
Sodium: 130 mmol/L — ABNORMAL LOW (ref 135–145)
Total Protein: 7.7 g/dL (ref 6.5–8.1)

## 2015-06-02 LAB — GLUCOSE, CAPILLARY
GLUCOSE-CAPILLARY: 212 mg/dL — AB (ref 65–99)
Glucose-Capillary: 258 mg/dL — ABNORMAL HIGH (ref 65–99)
Glucose-Capillary: 45 mg/dL — ABNORMAL LOW (ref 65–99)

## 2015-06-02 LAB — SEDIMENTATION RATE: Sed Rate: 70 mm/hr — ABNORMAL HIGH (ref 0–22)

## 2015-06-02 SURGERY — RIGHT/LEFT HEART CATH AND CORONARY ANGIOGRAPHY

## 2015-06-02 MED ORDER — ALBUTEROL SULFATE (2.5 MG/3ML) 0.083% IN NEBU: 2.5000 mg | INHALATION_SOLUTION | Freq: Four times a day (QID) | RESPIRATORY_TRACT | Status: DC | PRN

## 2015-06-02 MED ORDER — GLIPIZIDE 5 MG PO TABS
10.0000 mg | ORAL_TABLET | Freq: Two times a day (BID) | ORAL | Status: DC
  Administered 2015-06-02: 10 mg via ORAL
  Filled 2015-06-02 (×2): qty 1
  Filled 2015-06-02: qty 2

## 2015-06-02 MED ORDER — SITAGLIPTIN PHOS-METFORMIN HCL 50-1000 MG PO TABS: 1.0000 | ORAL_TABLET | Freq: Two times a day (BID) | ORAL | Status: DC

## 2015-06-02 MED ORDER — BENZONATATE 100 MG PO CAPS: 100.0000 mg | ORAL_CAPSULE | Freq: Three times a day (TID) | ORAL | Status: DC | PRN

## 2015-06-02 MED ORDER — LINAGLIPTIN 5 MG PO TABS
5.0000 mg | ORAL_TABLET | Freq: Every day | ORAL | Status: DC
  Administered 2015-06-02: 5 mg via ORAL
  Filled 2015-06-02: qty 1

## 2015-06-02 MED ORDER — INSULIN GLARGINE 100 UNIT/ML ~~LOC~~ SOLN
10.0000 [IU] | Freq: Every day | SUBCUTANEOUS | Status: DC
  Filled 2015-06-02: qty 0.1

## 2015-06-02 MED ORDER — FUROSEMIDE 40 MG PO TABS
40.0000 mg | ORAL_TABLET | Freq: Two times a day (BID) | ORAL | Status: DC
  Administered 2015-06-02 – 2015-06-05 (×7): 40 mg via ORAL
  Filled 2015-06-02 (×7): qty 1

## 2015-06-02 MED ORDER — METFORMIN HCL 500 MG PO TABS
1000.0000 mg | ORAL_TABLET | Freq: Two times a day (BID) | ORAL | Status: DC
  Administered 2015-06-02: 1000 mg via ORAL
  Filled 2015-06-02 (×3): qty 2

## 2015-06-02 MED ORDER — DM-GUAIFENESIN ER 30-600 MG PO TB12
1.0000 | ORAL_TABLET | Freq: Two times a day (BID) | ORAL | Status: DC
  Administered 2015-06-02 – 2015-06-10 (×17): 1 via ORAL
  Filled 2015-06-02 (×18): qty 1

## 2015-06-02 MED ORDER — ALBUTEROL SULFATE (2.5 MG/3ML) 0.083% IN NEBU
2.5000 mg | INHALATION_SOLUTION | Freq: Two times a day (BID) | RESPIRATORY_TRACT | Status: DC
  Administered 2015-06-03 – 2015-06-08 (×12): 2.5 mg via RESPIRATORY_TRACT
  Filled 2015-06-02 (×12): qty 3

## 2015-06-02 MED ORDER — ACETAMINOPHEN 325 MG PO TABS
650.0000 mg | ORAL_TABLET | Freq: Three times a day (TID) | ORAL | Status: DC
  Administered 2015-06-02 – 2015-06-10 (×25): 650 mg via ORAL
  Filled 2015-06-02 (×25): qty 2

## 2015-06-02 NOTE — Progress Notes (Signed)
DAILY PROGRESS NOTE  Subjective:  I's and O's recorded matched overnight, despite increase in lasix to 40 mg IV TID. Weight yesterday was 272, no weight recorded today. She reports her breathing is better. Sodium is up to 130 today. AST/ALT have improved, however creatinine has bumped up to 1.3. She is complaining of arthritis pain.  Objective:  Temp:  [98 F (36.7 C)-98.9 F (37.2 C)] 98.9 F (37.2 C) (07/22 0726) Pulse Rate:  [69-76] 76 (07/22 0127) Resp:  [17-21] 18 (07/22 0726) BP: (94-117)/(50-62) 100/62 mmHg (07/22 0726) SpO2:  [92 %-99 %] 92 % (07/22 0726) Weight change:   Intake/Output from previous day: 07/21 0701 - 07/22 0700 In: 850 [P.O.:850] Out: 850 [Urine:850]  Intake/Output from this shift:    Medications: Current Facility-Administered Medications  Medication Dose Route Frequency Provider Last Rate Last Dose  . 0.9 %  sodium chloride infusion  250 mL Intravenous PRN Geradine Girt, DO 0 mL/hr at 05/30/15 1941 250 mL at 05/30/15 1941  . acetaminophen (TYLENOL) tablet 650 mg  650 mg Oral Q6H PRN Geradine Girt, DO   650 mg at 05/30/15 1726  . albuterol (PROVENTIL) (2.5 MG/3ML) 0.083% nebulizer solution 2.5 mg  2.5 mg Nebulization Q6H Jessica U Vann, DO   2.5 mg at 06/02/15 0127  . aspirin EC tablet 81 mg  81 mg Oral Daily Geradine Girt, DO   81 mg at 06/01/15 1224  . atorvastatin (LIPITOR) tablet 80 mg  80 mg Oral q1800 Pixie Casino, MD   80 mg at 06/01/15 1815  . clopidogrel (PLAVIX) tablet 75 mg  75 mg Oral Daily Pixie Casino, MD   75 mg at 06/01/15 1224  . furosemide (LASIX) injection 40 mg  40 mg Intravenous BID Geradine Girt, DO   40 mg at 06/01/15 1815  . guaiFENesin-codeine 100-10 MG/5ML solution 10 mL  10 mL Oral Q4H PRN Pixie Casino, MD   10 mL at 06/02/15 0530  . heparin injection 5,000 Units  5,000 Units Subcutaneous 3 times per day Geradine Girt, DO   5,000 Units at 06/02/15 0526  . insulin aspart (novoLOG) injection 0-15 Units  0-15  Units Subcutaneous TID WC Geradine Girt, DO   5 Units at 06/01/15 1815  . insulin aspart (novoLOG) injection 0-5 Units  0-5 Units Subcutaneous QHS Geradine Girt, DO   2 Units at 06/01/15 2248  . insulin glargine (LANTUS) injection 17 Units  17 Units Subcutaneous QHS Pixie Casino, MD   17 Units at 06/01/15 2250  . levofloxacin (LEVAQUIN) tablet 750 mg  750 mg Oral Daily Geradine Girt, DO   750 mg at 06/01/15 1224  . sodium chloride 0.9 % injection 3 mL  3 mL Intravenous Q12H Jessica U Vann, DO   3 mL at 06/01/15 1000  . sodium chloride 0.9 % injection 3 mL  3 mL Intravenous Q12H Geradine Girt, DO   3 mL at 06/01/15 2252  . sodium chloride 0.9 % injection 3 mL  3 mL Intravenous PRN Geradine Girt, DO   3 mL at 05/31/15 0981    Physical Exam: General appearance: alert, no distress and morbidly obese Neck: JVD - 1 cm above sternal notch and no carotid bruit Lungs: diminished breath sounds bilaterally, rhonchi bilaterally and wheezes LUL and RUL Heart: regular rate and rhythm and occasional missed beats Abdomen: soft, non-tender; bowel sounds normal; no masses,  no organomegaly and obese Extremities: edema trace  bilateral pedal edema Pulses: 2+ and symmetric Skin: Skin color, texture, turgor normal. No rashes or lesions Neurologic: Mental status: Alert, oriented, thought content appropriate Psych: Pleasant  Lab Results: Results for orders placed or performed during the hospital encounter of 05/29/15 (from the past 48 hour(s))  CBC     Status: None   Collection Time: 05/31/15  9:14 AM  Result Value Ref Range   WBC 5.1 4.0 - 10.5 K/uL   RBC 4.96 3.87 - 5.11 MIL/uL   Hemoglobin 14.9 12.0 - 15.0 g/dL   HCT 42.6 36.0 - 46.0 %   MCV 85.9 78.0 - 100.0 fL   MCH 30.0 26.0 - 34.0 pg   MCHC 35.0 30.0 - 36.0 g/dL   RDW 14.5 11.5 - 15.5 %   Platelets 160 150 - 400 K/uL  Comprehensive metabolic panel     Status: Abnormal   Collection Time: 05/31/15  9:14 AM  Result Value Ref Range   Sodium  128 (L) 135 - 145 mmol/L   Potassium 4.7 3.5 - 5.1 mmol/L   Chloride 92 (L) 101 - 111 mmol/L   CO2 23 22 - 32 mmol/L   Glucose, Bld 264 (H) 65 - 99 mg/dL   BUN 47 (H) 6 - 20 mg/dL   Creatinine, Ser 1.08 (H) 0.44 - 1.00 mg/dL   Calcium 9.0 8.9 - 10.3 mg/dL   Total Protein 7.6 6.5 - 8.1 g/dL   Albumin 3.1 (L) 3.5 - 5.0 g/dL   AST 92 (H) 15 - 41 U/L   ALT 70 (H) 14 - 54 U/L   Alkaline Phosphatase 142 (H) 38 - 126 U/L   Total Bilirubin 1.4 (H) 0.3 - 1.2 mg/dL   GFR calc non Af Amer 53 (L) >60 mL/min   GFR calc Af Amer >60 >60 mL/min    Comment: (NOTE) The eGFR has been calculated using the CKD EPI equation. This calculation has not been validated in all clinical situations. eGFR's persistently <60 mL/min signify possible Chronic Kidney Disease.    Anion gap 13 5 - 15  Glucose, capillary     Status: Abnormal   Collection Time: 05/31/15  1:00 PM  Result Value Ref Range   Glucose-Capillary 294 (H) 65 - 99 mg/dL   Comment 1 Notify RN   Glucose, capillary     Status: Abnormal   Collection Time: 05/31/15  5:08 PM  Result Value Ref Range   Glucose-Capillary 309 (H) 65 - 99 mg/dL  Glucose, capillary     Status: Abnormal   Collection Time: 05/31/15  9:27 PM  Result Value Ref Range   Glucose-Capillary 287 (H) 65 - 99 mg/dL   Comment 1 Capillary Specimen   Lipid panel     Status: Abnormal   Collection Time: 06/01/15  2:34 AM  Result Value Ref Range   Cholesterol 143 0 - 200 mg/dL   Triglycerides 122 <150 mg/dL   HDL 21 (L) >40 mg/dL   Total CHOL/HDL Ratio 6.8 RATIO   VLDL 24 0 - 40 mg/dL   LDL Cholesterol 98 0 - 99 mg/dL    Comment:        Total Cholesterol/HDL:CHD Risk Coronary Heart Disease Risk Table                     Men   Women  1/2 Average Risk   3.4   3.3  Average Risk       5.0   4.4  2 X Average Risk  9.6   7.1  3 X Average Risk  23.4   11.0        Use the calculated Patient Ratio above and the CHD Risk Table to determine the patient's CHD Risk.        ATP III  CLASSIFICATION (LDL):  <100     mg/dL   Optimal  100-129  mg/dL   Near or Above                    Optimal  130-159  mg/dL   Borderline  160-189  mg/dL   High  >190     mg/dL   Very High   Glucose, capillary     Status: Abnormal   Collection Time: 06/01/15  7:17 AM  Result Value Ref Range   Glucose-Capillary 265 (H) 65 - 99 mg/dL   Comment 1 Capillary Specimen   CBC     Status: None   Collection Time: 06/01/15  9:42 AM  Result Value Ref Range   WBC 5.0 4.0 - 10.5 K/uL   RBC 4.41 3.87 - 5.11 MIL/uL   Hemoglobin 13.1 12.0 - 15.0 g/dL   HCT 39.0 36.0 - 46.0 %   MCV 88.4 78.0 - 100.0 fL   MCH 29.7 26.0 - 34.0 pg   MCHC 33.6 30.0 - 36.0 g/dL   RDW 14.4 11.5 - 15.5 %   Platelets 227 150 - 400 K/uL  Basic metabolic panel     Status: Abnormal   Collection Time: 06/01/15  9:42 AM  Result Value Ref Range   Sodium 127 (L) 135 - 145 mmol/L   Potassium 4.4 3.5 - 5.1 mmol/L   Chloride 88 (L) 101 - 111 mmol/L   CO2 27 22 - 32 mmol/L   Glucose, Bld 374 (H) 65 - 99 mg/dL   BUN 37 (H) 6 - 20 mg/dL   Creatinine, Ser 1.12 (H) 0.44 - 1.00 mg/dL   Calcium 8.8 (L) 8.9 - 10.3 mg/dL   GFR calc non Af Amer 51 (L) >60 mL/min   GFR calc Af Amer 59 (L) >60 mL/min    Comment: (NOTE) The eGFR has been calculated using the CKD EPI equation. This calculation has not been validated in all clinical situations. eGFR's persistently <60 mL/min signify possible Chronic Kidney Disease.    Anion gap 12 5 - 15  Glucose, capillary     Status: Abnormal   Collection Time: 06/01/15 11:11 AM  Result Value Ref Range   Glucose-Capillary 361 (H) 65 - 99 mg/dL   Comment 1 Capillary Specimen   Glucose, capillary     Status: Abnormal   Collection Time: 06/01/15  3:59 PM  Result Value Ref Range   Glucose-Capillary 242 (H) 65 - 99 mg/dL   Comment 1 Capillary Specimen   Glucose, capillary     Status: Abnormal   Collection Time: 06/01/15  9:54 PM  Result Value Ref Range   Glucose-Capillary 203 (H) 65 - 99 mg/dL    Comment 1 Notify RN   CBC     Status: None   Collection Time: 06/02/15  3:49 AM  Result Value Ref Range   WBC 5.7 4.0 - 10.5 K/uL   RBC 4.30 3.87 - 5.11 MIL/uL   Hemoglobin 12.8 12.0 - 15.0 g/dL   HCT 38.0 36.0 - 46.0 %   MCV 88.4 78.0 - 100.0 fL   MCH 29.8 26.0 - 34.0 pg   MCHC 33.7 30.0 - 36.0 g/dL   RDW 14.6  11.5 - 15.5 %   Platelets 247 150 - 400 K/uL  Comprehensive metabolic panel     Status: Abnormal   Collection Time: 06/02/15  3:49 AM  Result Value Ref Range   Sodium 130 (L) 135 - 145 mmol/L   Potassium 4.4 3.5 - 5.1 mmol/L   Chloride 91 (L) 101 - 111 mmol/L   CO2 29 22 - 32 mmol/L   Glucose, Bld 204 (H) 65 - 99 mg/dL   BUN 38 (H) 6 - 20 mg/dL   Creatinine, Ser 1.30 (H) 0.44 - 1.00 mg/dL   Calcium 9.0 8.9 - 10.3 mg/dL   Total Protein 7.7 6.5 - 8.1 g/dL   Albumin 2.8 (L) 3.5 - 5.0 g/dL   AST 56 (H) 15 - 41 U/L   ALT 56 (H) 14 - 54 U/L   Alkaline Phosphatase 141 (H) 38 - 126 U/L   Total Bilirubin 1.0 0.3 - 1.2 mg/dL   GFR calc non Af Amer 43 (L) >60 mL/min   GFR calc Af Amer 50 (L) >60 mL/min    Comment: (NOTE) The eGFR has been calculated using the CKD EPI equation. This calculation has not been validated in all clinical situations. eGFR's persistently <60 mL/min signify possible Chronic Kidney Disease.    Anion gap 10 5 - 15    Imaging: Dg Chest Port 1 View  06/01/2015   CLINICAL DATA:  Cough congestion, and wheezing since May of 2016  EXAM: PORTABLE CHEST - 1 VIEW  COMPARISON:  Chest x-ray of May 29, 2015 and May 16, 2015.  FINDINGS: The lungs are adequately inflated. There remain increased interstitial densities bilaterally. These have slightly improved since the previous study. Pulmonary vascular congestion is also slightly decreased. The cardiac silhouette remains enlarged but its margins are more distinct. The patient has undergone previous CABG. There are 8 intact sternal wires. The mediastinum is normal in width. The bony thorax exhibits no acute  abnormality.  IMPRESSION: Slight interval improvement in pulmonary interstitial edema secondary to CHF. There is no focal pneumonia.   Electronically Signed   By: David  Martinique M.D.   On: 06/01/2015 08:22    Assessment:  Active Problems:   Obesity, morbid-BMI 45   Type 2 diabetes mellitus, uncontrolled   Ischemic cardiomyopathy   Cough   CHF (congestive heart failure)   NSTEMI (non-ST elevated myocardial infarction)   Acute on chronic systolic CHF (congestive heart failure)   Hypoglycemia   Hyponatremia   Pressure ulcer   Renal insufficiency   Plan:  Really not a lot of recorded urine output overnight- creatinine is rising with TID lasix. Creatinine has been as high as 1.5 in the past, but was <1 last year which is likely her baseline. Will back off on diuresis today. Continue antibiotics and nebulizers. Arthritis management per medicine service. Probably can transfer to telemetry floor today.  Time Spent Directly with Patient:  15 minutes  Length of Stay:  LOS: 4 days   Pixie Casino, MD, [facility] Attending Cardiologist [place] 06/02/2015, 8:17 AM

## 2015-06-02 NOTE — Progress Notes (Signed)
Inpatient Diabetes Program Recommendations  AACE/ADA: New Consensus Statement on Inpatient Glycemic Control (2013)  Target Ranges:  Prepandial:   less than 140 mg/dL      Peak postprandial:   less than 180 mg/dL (1-2 hours)      Critically ill patients:  140 - 180 mg/dL   Pt had been on 17 units lantus and glucose running in low 200's. Lantus 10 units now ordered.  Inpatient Diabetes Program Recommendations Insulin - Basal: Please increase Lantus to 17 units QHS. Insulin - Meal Coverage: xxxxxxx Thank you Lenor Coffin, RN, MSN, CDE  Diabetes Inpatient Program Office: [phone] Pager: [phone] 8:00 am to 5:00 pm

## 2015-06-02 NOTE — Evaluation (Addendum)
Physical Therapy Evaluation Patient Details Name: Christine Knight MRN: [id number] DOB: Dec 07, 1950 Today's Date: 06/02/2015   History of Present Illness  64 y.o. female admitted to [facility] on 05/29/15 for slurred speech and weakness. Pt was found to be hypoglycemic and had elevated triponin in the ED.  CXR showed pulmonary edema and cardiology recommending cardiac cath, but pt refusing.  Pt dx with NSTEMI and uncontrolled DM.  Pt with significant PMHx of obesity, chronic systolic heart failure, DM, CAD (NSTEMI with subsequent CABG x 4 vessels), ischemic cardiomyopathy, pleural effusion (requiring L thoracentesis), and bil carpal tunnel syndrome.  Clinical Impression  Pt reports that today is a "bad day" re: her arthritis pain.  She has a very stiff, guarded gait pattern and is reaching for stability in her room and in the hallway "furniture walking."  She would benefit from a cane for home use on these bad days and PT plans to bring one for her to try during our next session.   PT to follow acutely for deficits listed below.       Follow Up Recommendations No PT follow up    Equipment Recommendations  Cane;Other (comment) (if pt agrees, she may opt to purchase it on her own)    Recommendations for Other Services   NA     Precautions / Restrictions Precautions Precautions: Fall Precaution Comments: due to painful arthritis throughout.  Pt is reaching for furniture for stability around room and in hallway.  Restrictions Weight Bearing Restrictions: No      Mobility   Transfers Overall transfer level: Needs assistance Equipment used: None Transfers: Sit to/from Stand Sit to Stand: Min guard         General transfer comment: Pt relies heavily on hands and extra time to get to standing.  Uncontrolled "crash" descent to sit despite use of hands.   Ambulation/Gait Ambulation/Gait assistance: Min assist Ambulation Distance (Feet): 100 Feet Assistive device: 1 person hand held  assist Gait Pattern/deviations: Step-through pattern;Antalgic;Staggering left;Staggering right Gait velocity: decreased Gait velocity interpretation: <1.8 ft/sec, indicative of risk for recurrent falls General Gait Details: pt with painful, stiff-legged gait pattern, reaching for hallway railing for stability during gait.  PT talked with pt about trying a cane that she could use at home on her "bad days" and pt seemed open to the idea.          Balance Overall balance assessment: Needs assistance Sitting-balance support: Feet supported;No upper extremity supported Sitting balance-[name] Scale: Good     Standing balance support: Single extremity supported Standing balance-[name] Scale: Fair                               Pertinent Vitals/Pain Pain Assessment: 0-10 Pain Score: 6  Pain Location: generalized in her joints Pain Descriptors / Indicators: Aching;Constant Pain Intervention(s): Limited activity within patient's tolerance;Monitored during session;Repositioned;Patient requesting pain meds-RN notified    Home Living Family/patient expects to be discharged to:: Private residence     Type of Home: Apartment Home Access: Stairs to enter Entrance Stairs-Rails: Right Entrance Stairs-Number of Steps: 6 Home Layout: One level Home Equipment: None      Prior Function Level of Independence: Independent         Comments: pt works full time at a desk job, drives        Extremity/Trunk Assessment   Upper Extremity Assessment: Overall [facility] for tasks assessed  Lower Extremity Assessment: Generalized weakness (limited by pain)         Communication   Communication: No difficulties  Cognition Arousal/Alertness: Awake/alert Behavior During Therapy: WFL for tasks assessed/performed;Flat affect Overall Cognitive Status: Within Functional Limits for tasks assessed                               Assessment/Plan    PT Assessment  Patient needs continued PT services  PT Diagnosis Difficulty walking;Abnormality of gait;Generalized weakness;Acute pain   PT Problem List Decreased strength;Decreased activity tolerance;Decreased balance;Decreased mobility;Decreased knowledge of use of DME;Obesity;Pain  PT Treatment Interventions DME instruction;Gait training;Stair training;Functional mobility training;Therapeutic activities;Therapeutic exercise;Balance training;Neuromuscular re-education;Patient/family education;Modalities   PT Goals (Current goals can be found in the Care Plan section) Acute Rehab PT Goals Patient Stated Goal: to feel better so she can go home PT Goal Formulation: With patient Time For Goal Achievement: 06/16/15 Potential to Achieve Goals: Good    Frequency Min 3X/week    End of Session   Activity Tolerance: No increased pain;Patient limited by fatigue Patient left: in chair;with call bell/phone within reach Nurse Communication: Patient requests pain meds         Time: 1610-9604 PT Time Calculation (min) (ACUTE ONLY): 13 min   Charges:   PT Evaluation $Initial PT Evaluation Tier I: 1 Procedure          Jibril Mcminn B. Caedyn Raygoza, PT, DPT [phone]   06/02/2015, 5:38 PM

## 2015-06-02 NOTE — Progress Notes (Signed)
PROGRESS NOTE  Christine Knight LXB:[id number] DOB: 08/20/1951 DOA: 05/29/2015 PCP: Scarlette Calico, MD  [name] is a 64 y.o.female. who presented the emergency room  with altered mental status. Her glucose was 40. This was treated in her mental status improved. However her chest x-ray showed pulmonary edema and her troponin was elevated. She has known coronary disease. She has significant left ventricular dysfunction.  Recently she was hospitalized with CHF. Right and left heart cath was recommended. The patient did not want this and she is still resistant to catherization   Assessment/Plan: NSTEMI- defer to cards ASA/plavix/statin.  Refuses cath. Transfer to telemetry  Uncontrolled DM CBGs remain high.  Resume home meds. Continue lantus HgbA1C: 11.2  AKI Monitor. Creatinine increased.  Cardiology decreasing Lasix.  Cough/UTI? Urine culture with multiple morphotypes.  Continue Levaquin  blood cultures negative to date.   Acute on chronic systolic and diastolic heart failure, improving   Obesity  Hypoglycemia Resolved   Hyponatremia Secondary to heart failure, improving  Polyarthritis: Patient has pain in multiple joints, including hands and knees wrists ankles and feet. She reports she has an appointment with Dr. Estanislado Pandy next month. Per outpatient records has a history of osteoarthritis. Will schedule Tylenol 3 times a day. Check rheumatoid factor, ANA, ESR. Physical therapy evaluation  Code Status: full Family Communication: patient a and o Disposition Plan: eventually home    Consultants:  cards  Procedures:    HPI/Subjective: Still with cough productive of white sputum. Breathing improved. Complains of arthritis pain in hands fingers wrists and feet and ankles and knees.   Objective: Filed Vitals:   06/02/15 0726  BP: 100/62  Pulse:   Temp: 98.9 F (37.2 C)  Resp: 18    Intake/Output Summary (Last 24 hours) at 06/02/15 0915 Last data filed at  06/01/15 1800  Gross per 24 hour  Intake    670 ml  Output    600 ml  Net     70 ml   Filed Weights   05/29/15 2009 05/31/15 0500 06/01/15 0341  Weight: 123.7 kg (272 lb 11.3 oz) 122.5 kg (270 lb 1 oz) 123.6 kg (272 lb 7.8 oz)    Exam:   General:  in chair. Alert oriented comfortable  Cardiovascular: rrr  Respiratory: diminished, without wheezes rhonchi or rales   Abdomen: +Bs, soft  Musculoskeletal:  trace pitting edema. Joint exam without warmth or significant tenderness.   Data Reviewed: Basic Metabolic Panel:  Recent Labs Lab 05/29/15 1131 05/30/15 0039 05/31/15 0914 06/01/15 0942 06/02/15 0349  NA 128* 129* 128* 127* 130*  K 4.5 4.4 4.7 4.4 4.4  CL 91* 91* 92* 88* 91*  CO2 $Re'26 26 23 27 29  'kOV$ GLUCOSE 42* 166* 264* 374* 204*  BUN 54* 52* 47* 37* 38*  CREATININE 1.50* 1.37* 1.08* 1.12* 1.30*  CALCIUM 9.1 8.9 9.0 8.8* 9.0   Liver Function Tests:  Recent Labs Lab 05/31/15 0914 06/02/15 0349  AST 92* 56*  ALT 70* 56*  ALKPHOS 142* 141*  BILITOT 1.4* 1.0  PROT 7.6 7.7  ALBUMIN 3.1* 2.8*   No results for input(s): LIPASE, AMYLASE in the last 168 hours. No results for input(s): AMMONIA in the last 168 hours. CBC:  Recent Labs Lab 05/29/15 1131 05/30/15 0039 05/31/15 0914 06/01/15 0942 06/02/15 0349  WBC 5.2 4.7 5.1 5.0 5.7  HGB 14.6 15.3* 14.9 13.1 12.8  HCT 42.2 43.9 42.6 39.0 38.0  MCV 86.7 88.0 85.9 88.4 88.4  PLT 173 182 160 227 247  Cardiac Enzymes:  Recent Labs Lab 05/29/15 1608 05/29/15 2040 05/30/15 0039 05/30/15 0705  TROPONINI 2.81* 1.98* 1.56* 1.45*   BNP (last 3 results)  Recent Labs  03/13/15 1135 05/29/15 1608  BNP 750.3* 921.7*    ProBNP (last 3 results) No results for input(s): PROBNP in the last 8760 hours.  CBG:  Recent Labs Lab 05/31/15 2127 06/01/15 0717 06/01/15 1111 06/01/15 1559 06/01/15 2154  GLUCAP 287* 265* 361* 242* 203*    Recent Results (from the past 240 hour(s))  Culture, Urine      Status: None   Collection Time: 05/29/15  2:04 PM  Result Value Ref Range Status   Specimen Description URINE, CLEAN CATCH  Final   Special Requests NONE  Final   Culture   Final    MULTIPLE SPECIES PRESENT, SUGGEST RECOLLECTION IF CLINICALLY INDICATED   Report Status 05/31/2015 FINAL  Final  MRSA PCR Screening     Status: None   Collection Time: 05/29/15  7:20 PM  Result Value Ref Range Status   MRSA by PCR NEGATIVE NEGATIVE Final    Comment:        The GeneXpert MRSA Assay (FDA approved for NASAL specimens only), is one component of a comprehensive MRSA colonization surveillance program. It is not intended to diagnose MRSA infection nor to guide or monitor treatment for MRSA infections.   Culture, blood (routine x 2)     Status: None (Preliminary result)   Collection Time: 05/30/15  8:50 AM  Result Value Ref Range Status   Specimen Description BLOOD LEFT HAND  Final   Special Requests BOTTLES DRAWN AEROBIC ONLY  3CC  Final   Culture NO GROWTH 2 DAYS  Final   Report Status PENDING  Incomplete     Studies: Dg Chest Port 1 View  06/01/2015   CLINICAL DATA:  Cough congestion, and wheezing since May of 2016  EXAM: PORTABLE CHEST - 1 VIEW  COMPARISON:  Chest x-ray of May 29, 2015 and May 16, 2015.  FINDINGS: The lungs are adequately inflated. There remain increased interstitial densities bilaterally. These have slightly improved since the previous study. Pulmonary vascular congestion is also slightly decreased. The cardiac silhouette remains enlarged but its margins are more distinct. The patient has undergone previous CABG. There are 8 intact sternal wires. The mediastinum is normal in width. The bony thorax exhibits no acute abnormality.  IMPRESSION: Slight interval improvement in pulmonary interstitial edema secondary to CHF. There is no focal pneumonia.   Electronically Signed   By: David  Martinique M.D.   On: 06/01/2015 08:22    Scheduled Meds: . acetaminophen  650 mg Oral TID    . albuterol  2.5 mg Nebulization Q6H  . aspirin  81 mg Oral Daily  . atorvastatin  80 mg Oral q1800  . clopidogrel  75 mg Oral Daily  . furosemide  40 mg Oral BID  . heparin  5,000 Units Subcutaneous 3 times per day  . insulin aspart  0-15 Units Subcutaneous TID WC  . insulin aspart  0-5 Units Subcutaneous QHS  . insulin glargine  17 Units Subcutaneous QHS  . levofloxacin  750 mg Oral Daily  . sodium chloride  3 mL Intravenous Q12H  . sodium chloride  3 mL Intravenous Q12H   Continuous Infusions:  Antibiotics Given (last 72 hours)    Date/Time Action Medication Dose Rate   05/30/15 1941 Given   cefTRIAXone (ROCEPHIN) 1 g in dextrose 5 % 50 mL IVPB - Premix 1 g  100 mL/hr   05/31/15 1306 Given   levofloxacin (LEVAQUIN) tablet 750 mg 750 mg    06/01/15 1224 Given   levofloxacin (LEVAQUIN) tablet 750 mg 750 mg       Active Problems:   Obesity, morbid-BMI 45   Type 2 diabetes mellitus, uncontrolled   Ischemic cardiomyopathy   Cough   CHF (congestive heart failure)   NSTEMI (non-ST elevated myocardial infarction)   Acute on chronic systolic CHF (congestive heart failure)   Hypoglycemia   Hyponatremia   Pressure ulcer   Renal insufficiency  Time spent: 25 min  [facility] [email or web address], password [facility] 06/02/2015, 9:15 AM  LOS: 4 days

## 2015-06-03 ENCOUNTER — Inpatient Hospital Stay (HOSPITAL_COMMUNITY): Payer: BLUE CROSS/BLUE SHIELD

## 2015-06-03 LAB — BASIC METABOLIC PANEL
Anion gap: 9 (ref 5–15)
BUN: 35 mg/dL — AB (ref 6–20)
CHLORIDE: 92 mmol/L — AB (ref 101–111)
CO2: 32 mmol/L (ref 22–32)
CREATININE: 1.16 mg/dL — AB (ref 0.44–1.00)
Calcium: 9 mg/dL (ref 8.9–10.3)
GFR calc non Af Amer: 49 mL/min — ABNORMAL LOW (ref >60)
GFR, EST AFRICAN AMERICAN: 57 mL/min — AB (ref >60)
GLUCOSE: 77 mg/dL (ref 65–99)
POTASSIUM: 4.3 mmol/L (ref 3.5–5.1)
Sodium: 133 mmol/L — ABNORMAL LOW (ref 135–145)

## 2015-06-03 LAB — GLUCOSE, CAPILLARY
GLUCOSE-CAPILLARY: 101 mg/dL — AB (ref 65–99)
GLUCOSE-CAPILLARY: 118 mg/dL — AB (ref 65–99)
GLUCOSE-CAPILLARY: 139 mg/dL — AB (ref 65–99)
GLUCOSE-CAPILLARY: 145 mg/dL — AB (ref 65–99)
GLUCOSE-CAPILLARY: 59 mg/dL — AB (ref 65–99)
Glucose-Capillary: 78 mg/dL (ref 65–99)

## 2015-06-03 LAB — RHEUMATOID FACTOR: RHEUMATOID FACTOR: 13.9 [IU]/mL (ref 0.0–13.9)

## 2015-06-03 MED ORDER — INSULIN GLARGINE 100 UNIT/ML ~~LOC~~ SOLN: 5.0000 [IU] | Freq: Every day | SUBCUTANEOUS | Status: DC

## 2015-06-03 MED ORDER — BENZONATATE 100 MG PO CAPS
100.0000 mg | ORAL_CAPSULE | Freq: Three times a day (TID) | ORAL | Status: DC
  Administered 2015-06-03 – 2015-06-10 (×22): 100 mg via ORAL
  Filled 2015-06-03 (×23): qty 1

## 2015-06-03 MED ORDER — DEXTROSE 50 % IV SOLN: 25.0000 mL | INTRAVENOUS | Status: DC | PRN

## 2015-06-03 MED ORDER — DEXTROSE 50 % IV SOLN: 1.0000 | INTRAVENOUS | Status: DC | PRN

## 2015-06-03 NOTE — Progress Notes (Signed)
Subjective:  Still complains of significant coughing of white sputum.  Creatinine is improved today with reduction and diuretics.  Blood pressure is still somewhat low.  Objective:  Vital Signs in the last 24 hours: BP 95/67 mmHg  Pulse 64  Temp(Src) 97.8 F (36.6 C) (Oral)  Resp 20  Ht  (1.702 m)  Wt 122.879 kg (270 lb 14.4 oz)  BMI 42.42 kg/m2  SpO2 98%  Physical Exam: Obese black female in no acute distress Pulmonary: Reduced breath sounds at bases  Neck: JVD not elevated Cardiac:  Regular rhythm, normal S1 and S2, no S3, no murmur heard Extremities:  No edema present  Intake/Output from previous day: 07/22 0701 - 07/23 0700 In: 915 [P.O.:715] Out: 500 [Urine:500] Weight Filed Weights   05/31/15 0500 06/01/15 0341 06/02/15 1303  Weight: 122.5 kg (270 lb 1 oz) 123.6 kg (272 lb 7.8 oz) 122.879 kg (270 lb 14.4 oz)    Lab Results: Basic Metabolic Panel:  Recent Labs  78/29/56 0349 06/03/15 0418  NA 130* 133*  K 4.4 4.3  CL 91* 92*  CO2 29 32  GLUCOSE 204* 77  BUN 38* 35*  CREATININE 1.30* 1.16*    CBC:  Recent Labs  06/01/15 0942 06/02/15 0349  WBC 5.0 5.7  HGB 13.1 12.8  HCT 39.0 38.0  MCV 88.4 88.4  PLT 227 247    BNP    Component Value Date/Time   BNP 921.7* 05/29/2015 1608   Telemetry: Currently sinus rhythm.  Assessment/Plan:    1.  Acute on chronic systolic heart failure-refuses catheterization-moderate to severe pulmonary hypertension noted 2.  Worsening renal insufficiency improved today 3.  Cough of uncertain etiology  Recommendations:  Intake and output is difficult to assess.  Her weight is essentially flat.  I would continue her current diuretics dose.  Darden Palmer  MD [facility]  06/03/2015, 11:53 AM

## 2015-06-03 NOTE — Progress Notes (Signed)
PROGRESS NOTE  Christine Knight QDI:[id number] DOB: 19-Nov-1950 DOA: 05/29/2015 PCP: Scarlette Calico, MD  Christine Knight is a 64 y.o.female. who presented the emergency room  with altered mental status. Her glucose was 40. This was treated in her mental status improved. However her chest x-ray showed pulmonary edema and her troponin was elevated. She has known coronary disease. She has significant left ventricular dysfunction.  Recently she was hospitalized with CHF. Right and left heart cath was recommended., but patient refuses.   Assessment/Plan: NSTEMI- defer to cards ASA/plavix/statin.  Refuses cath.   Uncontrolled DM with intermittent hypoglycemia Blood glucose is had been running high. Low overnight. Will hold hypoglycemics for now and resume slowly. HgbA1C: 11.2  AKI Monitor. Creatinine stabilizing.  Cough/UTI? Urine culture with multiple morphotypes.  Continue Levaquin  blood cultures negative to date.  Patient reports having a cough since May despite being on antibiotics as an outpatient and inpatient. Will get CT chest to rule out inflammatory component or underlying pneumonia. On scheduled Mucinex DM. Add scheduled Tessalon.  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 She reports she has an appointment with Dr. Estanislado Pandy next month. Per outpatient records has a history of osteoarthritis. Patient reports pain slightly improved today on scheduled Tylenol. Rheumatoid factor borderline high, ANA pending, ESR 70, nonspecific. Physical therapy without specific recommendations  Code Status: full Family Communication: patient a and o Disposition Plan: Home 1-2 days if remains stable.   Consultants:  cards  Procedures:    HPI/Subjective: Still with cough productive of white sputum.  Breathing improved. Pain in multiple joints somewhat improved, as she has been able to ambulate better.  Objective: Filed Vitals:   06/03/15 0440  BP: 95/67  Pulse: 64  Temp: 97.8 F (36.6 C)  Resp: 20    Intake/Output Summary (Last 24 hours) at 06/03/15 1026 Last data filed at 06/02/15 2345  Gross per 24 hour  Intake    560 ml  Output      0 ml  Net    560 ml   Filed Weights   05/31/15 0500 06/01/15 0341 06/02/15 1303  Weight: 122.5 kg (270 lb 1 oz) 123.6 kg (272 lb 7.8 oz) 122.879 kg (270 lb 14.4 oz)    Exam:   General:  in chair. Alert oriented. Frequent bouts of coughing.  Cardiovascular: rrr  Respiratory: diminished, without wheezes rhonchi or rales   Abdomen: +Bs, soft  Musculoskeletal:  trace pitting edema. Joint exam without warmth or significant tenderness.   Data Reviewed: Basic Metabolic Panel:  Recent Labs Lab 05/30/15 0039 05/31/15 0914 06/01/15 0942 06/02/15 0349 06/03/15 0418  NA 129* 128* 127* 130* 133*  K 4.4 4.7 4.4 4.4 4.3  CL 91* 92* 88* 91* 92*  CO2 $Re'26 23 27 29 'SmU$ 32  GLUCOSE 166* 264* 374* 204* 77  BUN 52* 47* 37* 38* 35*  CREATININE 1.37* 1.08* 1.12* 1.30* 1.16*  CALCIUM 8.9 9.0 8.8* 9.0 9.0   Liver Function Tests:  Recent Labs Lab 05/31/15 0914 06/02/15 0349  AST 92* 56*  ALT 70* 56*  ALKPHOS 142* 141*  BILITOT 1.4* 1.0  PROT 7.6 7.7  ALBUMIN 3.1* 2.8*   No results for input(s): LIPASE, AMYLASE in the last 168 hours. No results for input(s): AMMONIA in the last 168 hours. CBC:  Recent Labs Lab 05/29/15 1131 05/30/15 0039 05/31/15  6000 06/01/15 0942 06/02/15 0349  WBC 5.2 4.7 5.1 5.0 5.7  HGB 14.6 15.3* 14.9 13.1 12.8  HCT 42.2 43.9 42.6 39.0 38.0  MCV 86.7 88.0 85.9 88.4 88.4  PLT 173 182 160 227 247   Cardiac Enzymes:  Recent Labs Lab 05/29/15 1608 05/29/15 2040 05/30/15 0039 05/30/15 0705  TROPONINI 2.81* 1.98* 1.56* 1.45*   BNP (last 3 results)  Recent Labs  03/13/15 1135 05/29/15 1608  BNP  750.3* 921.7*    ProBNP (last 3 results) No results for input(s): PROBNP in the last 8760 hours.  CBG:  Recent Labs Lab 06/02/15 2248 06/02/15 2332 06/03/15 0047 06/03/15 0502 06/03/15 0805  GLUCAP 45* 59* 78 101* 139*    Recent Results (from the past 240 hour(s))  Culture, Urine     Status: None   Collection Time: 05/29/15  2:04 PM  Result Value Ref Range Status   Specimen Description URINE, CLEAN CATCH  Final   Special Requests NONE  Final   Culture   Final    MULTIPLE SPECIES PRESENT, SUGGEST RECOLLECTION IF CLINICALLY INDICATED   Report Status 05/31/2015 FINAL  Final  MRSA PCR Screening     Status: None   Collection Time: 05/29/15  7:20 PM  Result Value Ref Range Status   MRSA by PCR NEGATIVE NEGATIVE Final    Comment:        The GeneXpert MRSA Assay (FDA approved for NASAL specimens only), is one component of a comprehensive MRSA colonization surveillance program. It is not intended to diagnose MRSA infection nor to guide or monitor treatment for MRSA infections.   Culture, blood (routine x 2)     Status: None (Preliminary result)   Collection Time: 05/30/15  8:50 AM  Result Value Ref Range Status   Specimen Description BLOOD LEFT HAND  Final   Special Requests BOTTLES DRAWN AEROBIC ONLY  3CC  Final   Culture NO GROWTH 3 DAYS  Final   Report Status PENDING  Incomplete     Studies: No results found.  Scheduled Meds: . acetaminophen  650 mg Oral TID  . albuterol  2.5 mg Nebulization BID  . aspirin  81 mg Oral Daily  . atorvastatin  80 mg Oral q1800  . clopidogrel  75 mg Oral Daily  . dextromethorphan-guaiFENesin  1 tablet Oral BID  . furosemide  40 mg Oral BID  . heparin  5,000 Units Subcutaneous 3 times per day  . insulin aspart  0-15 Units Subcutaneous TID WC  . insulin aspart  0-5 Units Subcutaneous QHS  . levofloxacin  750 mg Oral Daily  . sodium chloride  3 mL Intravenous Q12H  . sodium chloride  3 mL Intravenous Q12H   Continuous  Infusions:  Antibiotics Given (last 72 hours)    Date/Time Action Medication Dose   05/31/15 1306 Given   levofloxacin (LEVAQUIN) tablet 750 mg 750 mg   06/01/15 1224 Given   levofloxacin (LEVAQUIN) tablet 750 mg 750 mg   06/02/15 1224 Given   levofloxacin (LEVAQUIN) tablet 750 mg 750 mg   06/03/15 0834 Given   levofloxacin (LEVAQUIN) tablet 750 mg 750 mg      Active Problems:   Obesity, morbid-BMI 45   Type 2 diabetes mellitus, uncontrolled   Ischemic cardiomyopathy   DJD (degenerative joint disease), multiple sites   Cough   NSTEMI (non-ST elevated myocardial infarction)   Acute on chronic systolic CHF (congestive heart failure)   Hypoglycemia   Hyponatremia   Pressure ulcer  Renal insufficiency  Time spent: 25 min  [facility] [email or web address], password [facility] 06/03/2015, 10:26 AM  LOS: 5 days

## 2015-06-03 NOTE — Progress Notes (Signed)
Maren Reamer NP  paged and made aware Hypoglycemia issues and what has been done so far. Stat BS cancelled Per Clydie Braun. To repeat CBG in and at 4am. Pt continues to remain asymptomatic.

## 2015-06-03 NOTE — Progress Notes (Signed)
HS CBG 45. Pt asymptomatic. VS obtained.  Orange juice with 2 sugars provided and a snack. Glipizide and Lantus Insulin held. 23:30 CBG repeated = 59  Stat Glucose ordered. Additional glass of OJ provided and graham crackers per pt request.

## 2015-06-04 LAB — BASIC METABOLIC PANEL
Anion gap: 9 (ref 5–15)
BUN: 24 mg/dL — ABNORMAL HIGH (ref 6–20)
CHLORIDE: 95 mmol/L — AB (ref 101–111)
CO2: 29 mmol/L (ref 22–32)
Calcium: 9.2 mg/dL (ref 8.9–10.3)
Creatinine, Ser: 1.01 mg/dL — ABNORMAL HIGH (ref 0.44–1.00)
GFR calc non Af Amer: 58 mL/min — ABNORMAL LOW (ref >60)
GLUCOSE: 163 mg/dL — AB (ref 65–99)
POTASSIUM: 4.2 mmol/L (ref 3.5–5.1)
Sodium: 133 mmol/L — ABNORMAL LOW (ref 135–145)

## 2015-06-04 LAB — GLUCOSE, CAPILLARY
Glucose-Capillary: 177 mg/dL — ABNORMAL HIGH (ref 65–99)
Glucose-Capillary: 170 mg/dL — ABNORMAL HIGH (ref 65–99)
Glucose-Capillary: 190 mg/dL — ABNORMAL HIGH (ref 65–99)
Glucose-Capillary: 195 mg/dL — ABNORMAL HIGH (ref 65–99)

## 2015-06-04 LAB — CULTURE, BLOOD (ROUTINE X 2): Culture: NO GROWTH

## 2015-06-04 LAB — BRAIN NATRIURETIC PEPTIDE: B NATRIURETIC PEPTIDE 5: 340.1 pg/mL — AB (ref 0.0–100.0)

## 2015-06-04 NOTE — Consult Note (Signed)
Name: Christine Knight MRN: [id number] DOB: 10-31-1951    ADMISSION DATE:  05/29/2015 CONSULTATION DATE:  06/04/15  REFERRING MD :  Conley Canal TRH  CHIEF COMPLAINT:  Cough  BRIEF PATIENT DESCRIPTION: 64 yo F never smoker admitted with AMS/ hypoglycemia. Known CAD, chronic CHF, DM, Pulmonary Hypertension. Persistent cough with white sputum persist since previous hosp for CHF. PCCM consulted with question if there is an additional underlying pulmonary process.  SIGNIFICANT EVENTS    STUDIES:  CT chest 7/23   HISTORY OF PRESENT ILLNESS:  64 yo F never smoker admitted with AMS/ hypoglycemia. Known CAD, chronic CHF, DM Pulmonary Hypertension. Persistent cough with white sputum persist since previous hosp for CHF. Smoked only in high school. No pneumonia or TB exposure recalled. She describes bothersome paroxysmal cough with slight wheeze, not a familiar part of her heart experience and present when she did not feel fluid overloaded. "Like a cold" but not responsive to antibiotics or cold meds. Describes night sweats, some chills, without purulent sputum, nodes, rash, dysuria.  Known osteoarthritis. Sed rate elevated, ANA pending. CT questions asymmetrical edema noting ground glass and septal thickening and offered ddx including viral pneumonia, sarcoid, lymphangitic Ca. She makes it clear she is not willing to have invasive procedures including heart cath or bronchoscopy, because she doesn't want to take any risks with her heart.  PAST MEDICAL HISTORY :   has a past medical history of Obesity, morbid; Chronic systolic heart failure; Hyperlipidemia (08/10/11); Diabetes mellitus type 2 in obese (08/10/11); CAD (coronary artery disease) (08/2011); Ischemic cardiomyopathy (08/13/2011); Pleural effusion; Mitral regurgitation; Hypertension; Pulmonary hypertension; Ejection fraction < 50%; Myocardial infarction (08/2011); Orthopnoea; CHF (congestive heart failure); and Carpal tunnel syndrome on both sides.  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 Prior to Admission medications   Medication Sig Start Date End Date Taking? Authorizing Provider  acetaminophen (TYLENOL) 325 MG tablet Take 650 mg by mouth every 6 (six) hours as needed for mild pain.   Yes Historical Provider, MD  aspirin 81 MG tablet Take 1 tablet (81 mg total) by mouth daily. 11/15/13  Yes Carlena Bjornstad, MD  Blood Glucose Monitoring Suppl (ONETOUCH VERIO) W/DEVICE KIT 1 Act by Does not apply route 3 (three) times daily. 10/12/14  Yes Janith Lima, MD  glipiZIDE (GLUCOTROL) 10 MG tablet Take 10 mg by mouth 2 (two) times daily. 02/20/15  Yes Historical Provider, MD  glucose blood test strip Use TID 10/12/14  Yes Janith Lima, MD  sitaGLIPtin-metformin (JANUMET) 50-1000 MG per tablet Take 1 tablet by mouth 2 (two) times daily with a meal. 05/17/15  Yes Janith Lima, MD  torsemide (DEMADEX) 20 MG tablet Take 2 tablets (40 mg total) by mouth 2 (two) times daily. 11/09/14  Yes Carlena Bjornstad, MD  Vitamin D, Ergocalciferol, (DRISDOL) 50000 UNITS CAPS capsule Take 50,000 Units by mouth 2 (two) times a week.  04/27/15  Yes Historical Provider, MD  atorvastatin (LIPITOR) 10 MG tablet Take 1 tablet (10 mg total) by mouth daily. Patient not taking: Reported on 05/29/2015 12/12/14   Carlena Bjornstad, MD  promethazine-dextromethorphan (PROMETHAZINE-DM) 6.25-15 MG/5ML syrup Take 5 mLs by mouth 4 (four) times daily as needed for cough. Patient not taking: Reported on 05/29/2015 05/16/15   Janith Lima, MD   Allergies  Allergen Reactions  . Lisinopril Cough    cough    FAMILY HISTORY:  family history includes Heart disease in her father. There is  no history of Cancer, Diabetes, Hypertension, or Kidney disease. SOCIAL HISTORY:  reports that she quit smoking about 36 years ago. Her smoking use included Cigarettes. She has never used smokeless tobacco. She reports that she drinks alcohol. She  reports that she does not use illicit drugs.  REVIEW OF SYSTEMS:   Constitutional: Negative for fever, chills, weight loss, malaise/fatigue and diaphoresis.  HENT: Negative for hearing loss, ear pain, nosebleeds, congestion, sore throat, neck pain, tinnitus and ear discharge.   Eyes: Negative for blurred vision, double vision, photophobia, pain, discharge and redness.  Respiratory: Negative for cough, hemoptysis, sputum production, shortness of breath, wheezing and stridor.   Cardiovascular: Negative for chest pain, palpitations, orthopnea, claudication, leg swelling and PND.  Gastrointestinal: Negative for heartburn, nausea, vomiting, abdominal pain, diarrhea, constipation, blood in stool and melena.  Genitourinary: Negative for dysuria, urgency, frequency, hematuria and flank pain.  Musculoskeletal: Negative for myalgias, back pain, joint pain and falls.  Skin: Negative for itching and rash.  Neurological: Negative for dizziness, tingling, tremors, sensory change, speech change, focal weakness, seizures, loss of consciousness, weakness and headaches.  Endo/Heme/Allergies: Negative for environmental allergies and polydipsia. Does not bruise/bleed easily.  SUBJECTIVE:   VITAL SIGNS: Temp:  [97.7 F (36.5 C)] 97.7 F (36.5 C) (07/24 0500) Pulse Rate:  [65-73] 73 (07/24 0500) Resp:  [18-20] 19 (07/24 0500) BP: (106-113)/(66) 113/66 mmHg (07/24 0500) SpO2:  [94 %-97 %] 94 % (07/24 0852) Weight:  [269 lb 4.8 oz (122.154 kg)] 269 lb 4.8 oz (122.154 kg) (07/24 0500)  PHYSICAL EXAMINATION: General:  Obese, pleasant, cooperative woman, up in chair eating breakfast Neuro:  Alert, oriented, non-focal HEENT:  Eating, speech clear, gross vision and hearing intact, no stridor or JVD Cardiovascular:  RR, I don't hear murmur Lungs:  Light cough and trace expiratory wheeze at end-expiration Abdomen:  Obese Musculoskeletal:  Moves all extremities, normal muscle bulk Skin:  No visible rash or  bruising Nodes- no enlarged nodes found   Recent Labs Lab 06/02/15 0349 06/03/15 0418 06/04/15 0546  NA 130* 133* 133*  K 4.4 4.3 4.2  CL 91* 92* 95*  CO2 29 32 29  BUN 38* 35* 24*  CREATININE 1.30* 1.16* 1.01*  GLUCOSE 204* 77 163*    Recent Labs Lab 05/31/15 0914 06/01/15 0942 06/02/15 0349  HGB 14.9 13.1 12.8  HCT 42.6 39.0 38.0  WBC 5.1 5.0 5.7  PLT 160 227 247   Ct Chest Wo Contrast  06/03/2015   CLINICAL DATA:  Shortness of breath and worsening cough for 2 months. History of ischemic cardiomyopathy and heart failure.  EXAM: CT CHEST WITHOUT CONTRAST  TECHNIQUE: Multidetector CT imaging of the chest was performed following the standard protocol without IV contrast.  COMPARISON:  Chest radiograph 06/01/2015  FINDINGS: No enlarged axillary or hilar lymph nodes are identified. Borderline enlarged mediastinal lymph nodes measure up to 9 mm in short axis. The heart is mildly enlarged. Sequelae of prior CABG are identified, and there are 3 vessel coronary artery calcifications. There is no pleural or pericardial effusion. A small amount of mucus/debris is present at the left mainstem bronchus orifice.  There are rather extensive areas of ground-glass opacity and interlobular septal thickening throughout the right lung, located greater peripherally than centrally and with evidence of some nodularity. The left lung is largely spared, although there are some mild areas of peripheral reticulation, predominantly in the left upper lobe, and minimal subpleural ground-glass opacity. There is a 3.5 cm confluent opacity in the posterior  left lower lobe which extends to the pleural surface with evidence of volume loss in the left lower lobe.  The visualized portion of the upper abdomen is unremarkable. Disc degeneration and vacuum disc phenomenon is seen at multiple levels in the lower thoracic and visualized upper lumbar spine.  IMPRESSION: 1. Ground-glass opacities and interlobular septal  thickening with some nodularity, predominantly involving the right lung and nonspecific. There may be a component of asymmetric pulmonary edema, however additional considerations include infectious pneumonia (such as viral), chronic interstitial pneumonitis, sarcoidosis, or possibly lymphangitic spread of tumor. Further evaluation with high-resolution chest CT as an outpatient is suggested. 2. Left lower lobe round atelectasis.   Electronically Signed   By: Logan Bores   On: 06/03/2015 15:35   I have reviewed CT images and compared with prior images  ASSESSMENT / PLAN: Cough- She thinks pattern has changed. There is known overlying chronic systolic LV CHF, and pulmonary hypertension. She thinks she is near dry weight. She is not wiling to have heart cath or bronchoscopy. She would be at increased risk of complications from bronch, so empiric treatment with steroids may be best approach. We will check ACE for possible sarcoid, but she is getting old for that as a new dx. The time course is too prolonged for an ordinary viral pneumonitis. If  This is lymphangitic Ca, that will become more evident.  Plan- Will Add ACE level and await ANA. Push to dry weight, then recommend High Resolution CT chest without contrast. Anticipate trial of steroid based on these results. We will follow with you.   Pulmonary and [place] Pager: [phone]  06/04/2015, 12:19 PM

## 2015-06-04 NOTE — Progress Notes (Signed)
PROGRESS NOTE  Christine Knight BII:[id number] DOB: Oct 02, 1951 DOA: 05/29/2015 PCP: Christine Linger, MD  [name] is a 64 y.o.female. who presented the emergency room  with altered mental status. Her glucose was 40. This was treated in her mental status improved. However her chest x-ray showed pulmonary edema and her troponin was elevated. She has known coronary disease. She has significant left ventricular dysfunction.  Recently she was hospitalized with CHF. Right and left heart cath was recommended., but patient refuses.   Assessment/Plan: NSTEMI- defer to cards ASA/plavix/statin.  Refuses cath.   Cough x2 months productive of white sputum.  This is patients main complaint.  CT chest shows right sided ground glass opacities: asymmetric edema v.  infectious pneumonia (such as viral), chronic interstitial pneumonitis, sarcoidosis, or possibly lymphangitic spread of tumor. Will consult pulmonary medicine for recommendations. May need steroids. Will repeat bnp. Had a course of antibiotic as an outpatient and now on levaquin.  Uncontrolled DM with intermittent hypoglycemia cbgs have been labile. Continue ssi for now  HgbA1C: 11.2  AKI improved  UTI? Urine culture with multiple morphotypes.  Continue Levaquin  blood cultures negative to date.    Acute on chronic systolic and diastolic heart failure, improving. Weights flat.  Obesity  Hyponatremia Secondary to heart failure, improving  Polyarthritis: Patient has pain in multiple joints, including hands and knees wrists ankles and feet. She reports she has an appointment with Dr. Corliss Skains next month. Per outpatient records has a history of osteoarthritis. Patient reports pain slightly improved today on scheduled Tylenol. Rheumatoid factor borderline high, ANA pending, ESR 70, nonspecific. Physical therapy without specific recommendations  Code Status: full Family Communication: patient a and o Disposition Plan: Home 1-2 days if  remains stable.   Consultants:  cards  Procedures:    HPI/Subjective: Still with cough productive of white sputum. Breathing improved. Pain in multiple joints somewhat improved Objective: Filed Vitals:   06/04/15 0500  BP: 113/66  Pulse: 73  Temp: 97.7 F (36.5 C)  Resp: 19   No intake or output data in the 24 hours ending 06/04/15 0808 Filed Weights   06/01/15 0341 06/02/15 1303 06/04/15 0500  Weight: 123.6 kg (272 lb 7.8 oz) 122.879 kg (270 lb 14.4 oz) 122.154 kg (269 lb 4.8 oz)    Exam:   General:  in chair. Alert oriented. Frequent bouts of wet coughing.  Cardiovascular: rrr without MGR  Respiratory: diminished, without wheezes rhonchi or rales   Abdomen: +Bs, soft  Musculoskeletal:  trace pitting edema. Joint exam without warmth or significant tenderness.   Data Reviewed: Basic Metabolic Panel:  Recent Labs Lab 05/31/15 0914 06/01/15 0942 06/02/15 0349 06/03/15 0418 06/04/15 0546  NA 128* 127* 130* 133* 133*  K 4.7 4.4 4.4 4.3 4.2  CL 92* 88* 91* 92* 95*  CO2 23 27 29  32 29  GLUCOSE 264* 374* 204* 77 163*  BUN 47* 37* 38* 35* 24*  CREATININE 1.08* 1.12* 1.30* 1.16* 1.01*  CALCIUM 9.0 8.8* 9.0 9.0 9.2   Liver Function Tests:  Recent Labs Lab 05/31/15 0914 06/02/15 0349  AST 92* 56*  ALT 70* 56*  ALKPHOS 142* 141*  BILITOT 1.4* 1.0  PROT 7.6 7.7  ALBUMIN 3.1* 2.8*   No results for input(s): LIPASE, AMYLASE in the last 168 hours. No results for input(s): AMMONIA in the last 168 hours. CBC:  Recent Labs Lab 05/29/15 1131 05/30/15 0039 05/31/15 0914 06/01/15 0942 06/02/15 0349  WBC 5.2 4.7 5.1 5.0 5.7  HGB 14.6 15.3*  14.9 13.1 12.8  HCT 42.2 43.9 42.6 39.0 38.0  MCV 86.7 88.0 85.9 88.4 88.4  PLT 173 182 160 227 247   Cardiac Enzymes:  Recent Labs Lab 05/29/15 1608 05/29/15 2040 05/30/15 0039 05/30/15 0705  TROPONINI 2.81* 1.98* 1.56* 1.45*   BNP (last 3 results)  Recent Labs  03/13/15 1135 05/29/15 1608  BNP  750.3* 921.7*    ProBNP (last 3 results) No results for input(s): PROBNP in the last 8760 hours.  CBG:  Recent Labs Lab 06/03/15 0047 06/03/15 0502 06/03/15 0805 06/03/15 1102 06/03/15 2050  GLUCAP 78 101* 139* 145* 118*    Recent Results (from the past 240 hour(s))  Culture, Urine     Status: None   Collection Time: 05/29/15  2:04 PM  Result Value Ref Range Status   Specimen Description URINE, CLEAN CATCH  Final   Special Requests NONE  Final   Culture   Final    MULTIPLE SPECIES PRESENT, SUGGEST RECOLLECTION IF CLINICALLY INDICATED   Report Status 05/31/2015 FINAL  Final  MRSA PCR Screening     Status: None   Collection Time: 05/29/15  7:20 PM  Result Value Ref Range Status   MRSA by PCR NEGATIVE NEGATIVE Final    Comment:        The GeneXpert MRSA Assay (FDA approved for NASAL specimens only), is one component of a comprehensive MRSA colonization surveillance program. It is not intended to diagnose MRSA infection nor to guide or monitor treatment for MRSA infections.   Culture, blood (routine x 2)     Status: None (Preliminary result)   Collection Time: 05/30/15  8:50 AM  Result Value Ref Range Status   Specimen Description BLOOD LEFT HAND  Final   Special Requests BOTTLES DRAWN AEROBIC ONLY  3CC  Final   Culture NO GROWTH 4 DAYS  Final   Report Status PENDING  Incomplete     Studies: Ct Chest Wo Contrast  06/03/2015   CLINICAL DATA:  Shortness of breath and worsening cough for 2 months. History of ischemic cardiomyopathy and heart failure.  EXAM: CT CHEST WITHOUT CONTRAST  TECHNIQUE: Multidetector CT imaging of the chest was performed following the standard protocol without IV contrast.  COMPARISON:  Chest radiograph 06/01/2015  FINDINGS: No enlarged axillary or hilar lymph nodes are identified. Borderline enlarged mediastinal lymph nodes measure up to 9 mm in short axis. The heart is mildly enlarged. Sequelae of prior CABG are identified, and there are 3  vessel coronary artery calcifications. There is no pleural or pericardial effusion. A small amount of mucus/debris is present at the left mainstem bronchus orifice.  There are rather extensive areas of ground-glass opacity and interlobular septal thickening throughout the right lung, located greater peripherally than centrally and with evidence of some nodularity. The left lung is largely spared, although there are some mild areas of peripheral reticulation, predominantly in the left upper lobe, and minimal subpleural ground-glass opacity. There is a 3.5 cm confluent opacity in the posterior left lower lobe which extends to the pleural surface with evidence of volume loss in the left lower lobe.  The visualized portion of the upper abdomen is unremarkable. Disc degeneration and vacuum disc phenomenon is seen at multiple levels in the lower thoracic and visualized upper lumbar spine.  IMPRESSION: 1. Ground-glass opacities and interlobular septal thickening with some nodularity, predominantly involving the right lung and nonspecific. There may be a component of asymmetric pulmonary edema, however additional considerations include infectious pneumonia (such as viral),  chronic interstitial pneumonitis, sarcoidosis, or possibly lymphangitic spread of tumor. Further evaluation with high-resolution chest CT as an outpatient is suggested. 2. Left lower lobe round atelectasis.   Electronically Signed   By: Logan Bores   On: 06/03/2015 15:35    Scheduled Meds: . acetaminophen  650 mg Oral TID  . albuterol  2.5 mg Nebulization BID  . aspirin  81 mg Oral Daily  . atorvastatin  80 mg Oral q1800  . benzonatate  100 mg Oral TID  . clopidogrel  75 mg Oral Daily  . dextromethorphan-guaiFENesin  1 tablet Oral BID  . furosemide  40 mg Oral BID  . heparin  5,000 Units Subcutaneous 3 times per day  . insulin aspart  0-15 Units Subcutaneous TID WC  . insulin aspart  0-5 Units Subcutaneous QHS  . levofloxacin  750 mg Oral  Daily  . sodium chloride  3 mL Intravenous Q12H  . sodium chloride  3 mL Intravenous Q12H   Continuous Infusions:  Antibiotics Given (last 72 hours)    Date/Time Action Medication Dose   06/01/15 1224 Given   levofloxacin (LEVAQUIN) tablet 750 mg 750 mg   06/02/15 1224 Given   levofloxacin (LEVAQUIN) tablet 750 mg 750 mg   06/03/15 0834 Given   levofloxacin (LEVAQUIN) tablet 750 mg 750 mg      Active Problems:   Obesity, morbid-BMI 45   Type 2 diabetes mellitus, uncontrolled   Ischemic cardiomyopathy   DJD (degenerative joint disease), multiple sites   Cough   NSTEMI (non-ST elevated myocardial infarction)   Acute on chronic systolic CHF (congestive heart failure)   Hypoglycemia   Hyponatremia   Pressure ulcer   Renal insufficiency  Time spent: 25 min  [facility] [email or web address], password [facility] 06/04/2015, 8:08 AM  LOS: 6 days

## 2015-06-04 NOTE — Progress Notes (Signed)
Pt had 7 bt run of vtach.  Asymptomatic and VSS, Dr. Lendell Caprice notified, no orders received.  Will continue to closely monitor.

## 2015-06-04 NOTE — Progress Notes (Signed)
Chaplain was paged to visit patient due to sadness and depression this morning, but she has had company all day.  Finally was able to visit privately with her this evening, in which we discussed her feelings "things are much better" and prayed together for her healing and peace.  Rev. [place], [place] [phone]

## 2015-06-04 NOTE — Progress Notes (Signed)
Subjective:  She is feeling better today but still coughing.  Weight is down another pound.  Still significant rhonchi and chest.  Objective:  Vital Signs in the last 24 hours: BP 113/66 mmHg  Pulse 73  Temp(Src) 97.7 F (36.5 C) (Oral)  Resp 19  Ht  (1.702 m)  Wt 122.154 kg (269 lb 4.8 oz)  BMI 42.17 kg/m2  SpO2 94%  Physical Exam: Obese black female in no acute distress Pulmonary: Reduced breath sounds at bases  Neck: JVD not elevated Cardiac:  Regular rhythm, normal S1 and S2, no S3, no murmur heard Extremities:  No edema present  Intake/Output from previous day: I/O last 3 completed shifts: In: 560 [P.O.:360; Other:200] Out: -      Weight Filed Weights   06/01/15 0341 06/02/15 1303 06/04/15 0500  Weight: 123.6 kg (272 lb 7.8 oz) 122.879 kg (270 lb 14.4 oz) 122.154 kg (269 lb 4.8 oz)    Lab Results: Basic Metabolic Panel:  Recent Labs  16/10/96 0418 06/04/15 0546  NA 133* 133*  K 4.3 4.2  CL 92* 95*  CO2 32 29  GLUCOSE 77 163*  BUN 35* 24*  CREATININE 1.16* 1.01*    CBC:  Recent Labs  06/02/15 0349  WBC 5.7  HGB 12.8  HCT 38.0  MCV 88.4  PLT 247    BNP    Component Value Date/Time   BNP 340.1* 06/04/2015 0945   Telemetry: Currently sinus rhythm.  Assessment/Plan:  1.  Acute on chronic systolic heart failure-refuses catheterization-moderate to severe pulmonary hypertension noted 2.  Acute renal failure-improved 3.  Cough of uncertain etiology-appears to be improving 4.  Obesity  Recommendations:  Weight down 11 pounds from admission.  Very difficult to tell about intake and output.  Not saving urine.  Continue diuretics at current dose since renal failure improved.  Darden Palmer  MD [facility]  06/04/2015, 11:14 AM

## 2015-06-05 DIAGNOSIS — I5021 Acute systolic (congestive) heart failure: Secondary | ICD-10-CM

## 2015-06-05 LAB — GLUCOSE, CAPILLARY
GLUCOSE-CAPILLARY: 222 mg/dL — AB (ref 65–99)
Glucose-Capillary: 187 mg/dL — ABNORMAL HIGH (ref 65–99)
Glucose-Capillary: 280 mg/dL — ABNORMAL HIGH (ref 65–99)
Glucose-Capillary: 151 mg/dL — ABNORMAL HIGH (ref 65–99)
Glucose-Capillary: 178 mg/dL — ABNORMAL HIGH (ref 65–99)

## 2015-06-05 LAB — ANGIOTENSIN CONVERTING ENZYME: ANGIOTENSIN-CONVERTING ENZYME: 56 U/L (ref 14–82)

## 2015-06-05 LAB — ANTINUCLEAR ANTIBODIES, IFA: ANTINUCLEAR ANTIBODIES, IFA: NEGATIVE

## 2015-06-05 MED ORDER — FUROSEMIDE 10 MG/ML IJ SOLN
80.0000 mg | Freq: Two times a day (BID) | INTRAMUSCULAR | Status: DC
  Administered 2015-06-05 – 2015-06-08 (×7): 80 mg via INTRAVENOUS
  Filled 2015-06-05 (×7): qty 8

## 2015-06-05 MED ORDER — GLIPIZIDE 5 MG PO TABS
5.0000 mg | ORAL_TABLET | Freq: Every day | ORAL | Status: DC
  Administered 2015-06-05 – 2015-06-07 (×3): 5 mg via ORAL
  Filled 2015-06-05 (×3): qty 1

## 2015-06-05 NOTE — Progress Notes (Signed)
While walking in the hall patient has 4-5 beat run of SVT in the 140s. Patient was asymptomatic. Is now NSR with occassional PVCs. Notified Kirby NP of this and will continue to monitor.

## 2015-06-05 NOTE — Progress Notes (Signed)
PROGRESS NOTE  Christine Knight OVP:[id number] DOB: 07-05-1951 DOA: 05/29/2015 PCP: Scarlette Calico, MD  Christine Knight is a 64 y.o.female. who presented the emergency room  with altered mental status. Her glucose was 40. This was treated in her mental status improved. However her chest x-ray showed pulmonary edema and her troponin was elevated. She has known coronary disease. She has significant left ventricular dysfunction.  Recently she was hospitalized with CHF. Right and left heart cath was recommended, but patient refuses.  Chronic cough x2 months has been worked up with CT chest and pulmonary consult.   Assessment/Plan: NSTEMI ASA/plavix/statin.  Refuses cath.   Cough x2 months productive of white sputum.  This is patients main complaint.  CT chest shows right sided ground glass opacities: asymmetric edema v.  infectious pneumonia (such as viral), chronic interstitial pneumonitis, sarcoidosis, or possibly lymphangitic spread of tumor. Repeat BNP was 340, down from 921. Weight has decreased to 266 from 280 with diuresis, but was in the 255-260 range 2 months ago. Discussed with pulmonary and cardiology. Would push diuresis a bit more to dry weight while monitoring basic metabolic panels and get a high resolution CT chest as an inpatient.  Will give IV Lasix 80 mg twice a day today and monitor BMET. Has received 5 days of levofloxacin. Completed course.  Uncontrolled DM with intermittent hypoglycemia cbgs have been labile but starting to increase again. Continue sliding scale. Will resume glipizide at a lower dose and monitor. Will likely need lowering of oral hypoglycemic agents at discharge HgbA1C: 8  AKI Improved.  UTI? Urine culture with multiple morphotypes.   Levaquin course completed blood cultures negative  Acute on chronic systolic and diastolic heart failure, improving. See above  Obesity  Hyponatremia Secondary to heart failure, improving  Polyarthritis: Patient has pain  in multiple joints, including hands and knees wrists ankles and feet. She reports she has an appointment with Dr. Estanislado Pandy next month. Per outpatient records has a history of osteoarthritis. Patient reports pain slightly improved on scheduled Tylenol. Rheumatoid factor borderline high, ANA pending, ESR 70, nonspecific. Physical therapy without specific recommendations  Code Status: full Family Communication: patient a and o Disposition Plan: Home once diuresed down to dry weight and after high resolution CT chest   Consultants:  cards  Procedures:    HPI/Subjective: Cough improving. Was able to rest last night. Denies dyspnea or chest pain. Joint pains improved.  Objective: Filed Vitals:   06/05/15 0900  BP: 109/67  Pulse: 71  Temp: 98.1 F (36.7 C)  Resp:     Intake/Output Summary (Last 24 hours) at 06/05/15 1053 Last data filed at 06/05/15 0847  Gross per 24 hour  Intake    480 ml  Output      0 ml  Net    480 ml   Filed Weights   06/02/15 1303 06/04/15 0500 06/05/15 0643  Weight: 122.879 kg (270 lb 14.4 oz) 122.154 kg (269 lb 4.8 oz) 121.065 kg (266 lb 14.4 oz)    Exam:   General:  in chair. Alert oriented. No cough today!  Cardiovascular: rrr without MGR  Respiratory: diminished, without wheezes rhonchi or rales   Abdomen: +Bs, soft  Musculoskeletal:  trace pitting edema. Joint exam without warmth or significant tenderness.   Data Reviewed: Basic Metabolic Panel:  Recent Labs Lab 05/31/15 0914 06/01/15 0942 06/02/15 0349 06/03/15 0418 06/04/15 0546  NA 128* 127* 130* 133* 133*  K 4.7 4.4 4.4 4.3 4.2  CL 92* 88* 91* 92*  95*  CO2 23 27 29  32 29  GLUCOSE 264* 374* 204* 77 163*  BUN 47* 37* 38* 35* 24*  CREATININE 1.08* 1.12* 1.30* 1.16* 1.01*  CALCIUM 9.0 8.8* 9.0 9.0 9.2   Liver Function Tests:  Recent Labs Lab 05/31/15 0914 06/02/15 0349  AST 92* 56*  ALT 70* 56*  ALKPHOS 142* 141*  BILITOT 1.4* 1.0  PROT 7.6 7.7  ALBUMIN 3.1*  2.8*   No results for input(s): LIPASE, AMYLASE in the last 168 hours. No results for input(s): AMMONIA in the last 168 hours. CBC:  Recent Labs Lab 05/29/15 1131 05/30/15 0039 05/31/15 0914 06/01/15 0942 06/02/15 0349  WBC 5.2 4.7 5.1 5.0 5.7  HGB 14.6 15.3* 14.9 13.1 12.8  HCT 42.2 43.9 42.6 39.0 38.0  MCV 86.7 88.0 85.9 88.4 88.4  PLT 173 182 160 227 247   Cardiac Enzymes:  Recent Labs Lab 05/29/15 1608 05/29/15 2040 05/30/15 0039 05/30/15 0705  TROPONINI 2.81* 1.98* 1.56* 1.45*   BNP (last 3 results)  Recent Labs  03/13/15 1135 05/29/15 1608 06/04/15 0945  BNP 750.3* 921.7* 340.1*    ProBNP (last 3 results) No results for input(s): PROBNP in the last 8760 hours.  CBG:  Recent Labs Lab 06/04/15 0807 06/04/15 1156 06/04/15 1641 06/04/15 2103 06/05/15 0743  GLUCAP 177* 170* 190* 195* 222*    Recent Results (from the past 240 hour(s))  Culture, Urine     Status: None   Collection Time: 05/29/15  2:04 PM  Result Value Ref Range Status   Specimen Description URINE, CLEAN CATCH  Final   Special Requests NONE  Final   Culture   Final    MULTIPLE SPECIES PRESENT, SUGGEST RECOLLECTION IF CLINICALLY INDICATED   Report Status 05/31/2015 FINAL  Final  MRSA PCR Screening     Status: None   Collection Time: 05/29/15  7:20 PM  Result Value Ref Range Status   MRSA by PCR NEGATIVE NEGATIVE Final    Comment:        The GeneXpert MRSA Assay (FDA approved for NASAL specimens only), is one component of a comprehensive MRSA colonization surveillance program. It is not intended to diagnose MRSA infection nor to guide or monitor treatment for MRSA infections.   Culture, blood (routine x 2)     Status: None   Collection Time: 05/30/15  8:50 AM  Result Value Ref Range Status   Specimen Description BLOOD LEFT HAND  Final   Special Requests BOTTLES DRAWN AEROBIC ONLY  3CC  Final   Culture NO GROWTH 5 DAYS  Final   Report Status 06/04/2015 FINAL  Final      Studies: Ct Chest Wo Contrast  06/03/2015   CLINICAL DATA:  Shortness of breath and worsening cough for 2 months. History of ischemic cardiomyopathy and heart failure.  EXAM: CT CHEST WITHOUT CONTRAST  TECHNIQUE: Multidetector CT imaging of the chest was performed following the standard protocol without IV contrast.  COMPARISON:  Chest radiograph 06/01/2015  FINDINGS: No enlarged axillary or hilar lymph nodes are identified. Borderline enlarged mediastinal lymph nodes measure up to 9 mm in short axis. The heart is mildly enlarged. Sequelae of prior CABG are identified, and there are 3 vessel coronary artery calcifications. There is no pleural or pericardial effusion. A small amount of mucus/debris is present at the left mainstem bronchus orifice.  There are rather extensive areas of ground-glass opacity and interlobular septal thickening throughout the right lung, located greater peripherally than centrally and with evidence of  some nodularity. The left lung is largely spared, although there are some mild areas of peripheral reticulation, predominantly in the left upper lobe, and minimal subpleural ground-glass opacity. There is a 3.5 cm confluent opacity in the posterior left lower lobe which extends to the pleural surface with evidence of volume loss in the left lower lobe.  The visualized portion of the upper abdomen is unremarkable. Disc degeneration and vacuum disc phenomenon is seen at multiple levels in the lower thoracic and visualized upper lumbar spine.  IMPRESSION: 1. Ground-glass opacities and interlobular septal thickening with some nodularity, predominantly involving the right lung and nonspecific. There may be a component of asymmetric pulmonary edema, however additional considerations include infectious pneumonia (such as viral), chronic interstitial pneumonitis, sarcoidosis, or possibly lymphangitic spread of tumor. Further evaluation with high-resolution chest CT as an outpatient is  suggested. 2. Left lower lobe round atelectasis.   Electronically Signed   By: Logan Bores   On: 06/03/2015 15:35    Scheduled Meds: . acetaminophen  650 mg Oral TID  . albuterol  2.5 mg Nebulization BID  . aspirin  81 mg Oral Daily  . atorvastatin  80 mg Oral q1800  . benzonatate  100 mg Oral TID  . clopidogrel  75 mg Oral Daily  . dextromethorphan-guaiFENesin  1 tablet Oral BID  . furosemide  80 mg Intravenous BID  . heparin  5,000 Units Subcutaneous 3 times per day  . insulin aspart  0-15 Units Subcutaneous TID WC  . insulin aspart  0-5 Units Subcutaneous QHS  . sodium chloride  3 mL Intravenous Q12H  . sodium chloride  3 mL Intravenous Q12H   Continuous Infusions:  Antibiotics Given (last 72 hours)    Date/Time Action Medication Dose   06/02/15 1224 Given   levofloxacin (LEVAQUIN) tablet 750 mg 750 mg   06/03/15 0834 Given   levofloxacin (LEVAQUIN) tablet 750 mg 750 mg   06/04/15 6314 Given   levofloxacin (LEVAQUIN) tablet 750 mg 750 mg      Principal Problem:   Acute on chronic systolic CHF (congestive heart failure) Active Problems:   Obesity, morbid-BMI 45   Hyperlipidemia LDL goal <70   Type 2 diabetes mellitus, uncontrolled   Hx of CABG 2012   Ischemic cardiomyopathy-30-35% May 2016   Pulmonary hypertension   Hypothyroidism   Chronic systolic heart failure   Spironolactone adverse reaction   Painful diabetic neuropathy   DJD (degenerative joint disease), multiple sites   Cough   NSTEMI- declined cath   Hypoglycemia on adm 7/18   Hyponatremia   Pressure ulcer   Renal insufficiency  Time spent: 25 min  [facility] [email or web address], password [facility] 06/05/2015, 10:53 AM  LOS: 7 days

## 2015-06-05 NOTE — Progress Notes (Signed)
Subjective:  Still has a dry cough  Objective:  Vital Signs in the last 24 hours: Temp:  [97.8 F (36.6 C)-98.1 F (36.7 C)] 97.8 F (36.6 C) (07/25 0510) Pulse Rate:  [69-74] 74 (07/25 0510) Resp:  [18] 18 (07/25 0510) BP: (109-126)/(59-74) 109/74 mmHg (07/25 0510) SpO2:  [94 %-99 %] 96 % (07/25 0510) Weight:  [266 lb 14.4 oz (121.065 kg)] 266 lb 14.4 oz (121.065 kg) (07/25 0643)  Intake/Output from previous day:  Intake/Output Summary (Last 24 hours) at 06/05/15 0848 Last data filed at 06/05/15 0847  Gross per 24 hour  Intake    480 ml  Output      0 ml  Net    480 ml    Physical Exam: General appearance: alert, cooperative, no distress and morbidly obese Lungs: decreased breath sounds Lt base- no wheezing appreciated Heart: regular rate and rhythm   Rate: 80  Rhythm: normal sinus rhythm and premature atrial contractions (PAC)  Lab Results: No results for input(s): WBC, HGB, PLT in the last 72 hours.  Recent Labs  06/03/15 0418 06/04/15 0546  NA 133* 133*  K 4.3 4.2  CL 92* 95*  CO2 32 29  GLUCOSE 77 163*  BUN 35* 24*  CREATININE 1.16* 1.01*   No results for input(s): TROPONINI in the last 72 hours.  Invalid input(s): CK, MB No results for input(s): INR in the last 72 hours.  Scheduled Meds: . acetaminophen  650 mg Oral TID  . albuterol  2.5 mg Nebulization BID  . aspirin  81 mg Oral Daily  . atorvastatin  80 mg Oral q1800  . benzonatate  100 mg Oral TID  . clopidogrel  75 mg Oral Daily  . dextromethorphan-guaiFENesin  1 tablet Oral BID  . furosemide  40 mg Oral BID  . heparin  5,000 Units Subcutaneous 3 times per day  . insulin aspart  0-15 Units Subcutaneous TID WC  . insulin aspart  0-5 Units Subcutaneous QHS  . sodium chloride  3 mL Intravenous Q12H  . sodium chloride  3 mL Intravenous Q12H   Continuous Infusions:  PRN Meds:.sodium chloride, albuterol, dextrose, sodium chloride   Imaging: Ct Chest Wo Contrast  06/03/2015   CLINICAL  DATA:  Shortness of breath and worsening cough for 2 months. History of ischemic cardiomyopathy and heart failure.  EXAM: CT CHEST WITHOUT CONTRAST  TECHNIQUE:   IMPRESSION: 1. Ground-glass opacities and interlobular septal thickening with some nodularity, predominantly involving the right lung and nonspecific. There may be a component of asymmetric pulmonary edema, however additional considerations include infectious pneumonia (such as viral), chronic interstitial pneumonitis, sarcoidosis, or possibly lymphangitic spread of tumor. Further evaluation with high-resolution chest CT as an outpatient is suggested. 2. Left lower lobe round atelectasis.   Electronically Signed   By: Sebastian Ache   On: 06/03/2015 15:35    Assessment/Plan:   Principal Problem:   Acute on chronic systolic CHF (congestive heart failure) Active Problems:   Cough   NSTEMI- declined cath   Type 2 diabetes mellitus, uncontrolled   Hx of CABG 2012   Ischemic cardiomyopathy-30-35% May 2016   Pulmonary hypertension   Chronic systolic heart failure   Hypoglycemia on adm 7/18   Obesity, morbid-BMI 45   Hyperlipidemia LDL goal <70   Hypothyroidism   Spironolactone adverse reaction   Painful diabetic neuropathy   DJD (degenerative joint disease), multiple sites   Hyponatremia   Pressure ulcer   Renal insufficiency   PLAN:  Per Dr Roxy Cedar consult note 06/04/15. Pt appears to be compensated from CHF standpoint. Wgt down to 266 from 280. BNP and renal stable. Consider ARB ? -may want to resolve cough issue first.   Corine Shelter PA-C 06/05/2015, 8:48 AM [phone]  Patient seen and examined. I agree with the assessment and plan as detailed above. See also my additional thoughts below.   I have spoken with Dr. Lendell Caprice. It is very difficult to know what this patient's home dry weight is. I feel that we do need to try to push diuresis with her in the hospital so that we can get her to the driest weight possible watching her  renal function. We can then do the high-resolution CT scan at that point.  Willa Rough, MD, [facility] 06/05/2015 11:22 AM

## 2015-06-05 NOTE — Progress Notes (Signed)
[facility] PCCM  S: feels better, no cough O:  Filed Vitals:   06/05/15 0510 06/05/15 0643 06/05/15 0900 06/05/15 0957  BP: 109/74  109/67   Pulse: 74  71   Temp: 97.8 F (36.6 C)  98.1 F (36.7 C)   TempSrc: Oral  Oral   Resp: 18     Height:      Weight:  121.065 kg (266 lb 14.4 oz)    SpO2: 96%  99% 98%   RA  Gen: well appearing HENT: OP clear, TM's clear, neck supple PULM: CTA B, normal percussion CV: RRR, no mgr, trace edema GI: BS+, soft, nontender Derm: no cyanosis or rash Psyche: normal mood and affect  CT chest > R> L tree-in bud and interstitial changes in a bronchovascular pattern  Serum ACE> pending  Impression/Plan Acute respiratory distress> improved; suspect this was a viral process given rapid improvement; CHF likely contributing;  I explained to her that she should follow up with my partner Dr. Maple Hudson in clinic and I have asked her to call for that after leaving the hospital.  She may need to have a HRCT at somepoint, but given her clinical improvement and normal oxygenation I don't think it is needed now  PCCM to sign off  He[place] , MD [place] [facility] Pager: [phone] Cell: [phone] After 3pm or if no response, call [phone]

## 2015-06-05 NOTE — Progress Notes (Signed)
Physical Therapy Treatment Patient Details Name: Christine Knight MRN: [id number] DOB: [birth year] Today's Date: 06/05/2015    History of Present Illness 64 y.o. female admitted to [facility] on 05/29/15 for slurred speech and weakness. Pt was found to be hypoglycemic and had elevated triponin in the ED.  CXR showed pulmonary edema and cardiology recommending cardiac cath, but pt refusing.  Pt dx with NSTEMI and uncontrolled DM.  Pt with significant PMHx of obesity, chronic systolic heart failure, DM, CAD (NSTEMI with subsequent CABG x 4 vessels), ischemic cardiomyopathy, pleural effusion (requiring L thoracentesis), and bil carpal tunnel syndrome.    PT Comments    Pt doing great today and did well with SPC, however feels that she would like to purchase one on her own.  Will sign off on pt at this time.   Follow Up Recommendations  No PT follow up     Equipment Recommendations   (pt states she will purchase her own)    Recommendations for Other Services       Precautions / Restrictions Precautions Precautions: Fall Restrictions Weight Bearing Restrictions: No    Mobility  Bed Mobility               General bed mobility comments: Pt in recliner when PT arrived  Transfers Overall transfer level: Modified independent Equipment used: None Transfers: Sit to/from Stand Sit to Stand: Modified independent (Device/Increase time)            Ambulation/Gait Ambulation/Gait assistance: Modified independent (Device/Increase time);Supervision Ambulation Distance (Feet): 200 Feet Assistive device: Straight cane Gait Pattern/deviations: WFL(Within Functional Limits) Gait velocity: decreased   General Gait Details: Pt with no pain today in BLEs, however agreeable to learning use of SPC.  Provided demonstration and pt able to return at S progressing to mod I level during session.  Discussed ordering SPC and pt states that she would like to just buy one at [facility] so that it can be more  "fancy".     Stairs Stairs: Yes Stairs assistance: Supervision Stair Management: One rail Left;With cane Number of Stairs: 8 General stair comments: Provided pt with different sequencing options in case of increased pain from arthritis flare up.   Wheelchair Mobility    Modified Rankin (Stroke Patients Only)       Balance Overall balance assessment: Modified Independent                                  Cognition Arousal/Alertness: Awake/alert Behavior During Therapy: WFL for tasks assessed/performed;Flat affect Overall Cognitive Status: Within Functional Limits for tasks assessed                      Exercises      General Comments        Pertinent Vitals/Pain Pain Assessment: No/denies pain    Home Living                      Prior Function            PT Goals (current goals can now be found in the care plan section) Acute Rehab PT Goals Patient Stated Goal: to feel better so she can go home PT Goal Formulation: With patient Time For Goal Achievement: 06/16/15 Potential to Achieve Goals: Good Progress towards PT goals: Progressing toward goals    Frequency  Min 3X/week    PT Plan  (Will D/C therapy, pt mod  I)    Co-evaluation             End of Session   Activity Tolerance: No increased pain Patient left: in chair;with call bell/phone within reach     Time: 7829-5621 PT Time Calculation (min) (ACUTE ONLY): 13 min  Charges:  $Gait Training: 8-22 mins                    G Codes:      Vista Deck 06/05/2015, 5:03 PM

## 2015-06-06 LAB — BASIC METABOLIC PANEL
ANION GAP: 8 (ref 5–15)
BUN: 22 mg/dL — ABNORMAL HIGH (ref 6–20)
CALCIUM: 9.4 mg/dL (ref 8.9–10.3)
CO2: 28 mmol/L (ref 22–32)
CREATININE: 1.06 mg/dL — AB (ref 0.44–1.00)
Chloride: 98 mmol/L — ABNORMAL LOW (ref 101–111)
GFR calc Af Amer: >60 mL/min (ref >60)
GFR, EST NON AFRICAN AMERICAN: 55 mL/min — AB (ref >60)
GLUCOSE: 210 mg/dL — AB (ref 65–99)
POTASSIUM: 4 mmol/L (ref 3.5–5.1)
Sodium: 134 mmol/L — ABNORMAL LOW (ref 135–145)

## 2015-06-06 LAB — GLUCOSE, CAPILLARY
GLUCOSE-CAPILLARY: 246 mg/dL — AB (ref 65–99)
GLUCOSE-CAPILLARY: 250 mg/dL — AB (ref 65–99)
GLUCOSE-CAPILLARY: 290 mg/dL — AB (ref 65–99)
Glucose-Capillary: 248 mg/dL — ABNORMAL HIGH (ref 65–99)

## 2015-06-06 NOTE — Progress Notes (Signed)
PROGRESS NOTE  Christine Knight JEO:[id number] DOB: 02-18-1951 DOA: 05/29/2015 PCP: Christine Linger, MD  Christine Knight is a 64 y.o.female. who presented the emergency room  with altered mental status. Her glucose was 40. This was treated in her mental status improved. However her chest x-ray showed pulmonary edema and her troponin was elevated. She has known coronary disease. She has significant left ventricular dysfunction.  Recently she was hospitalized with CHF. Right and left heart cath was recommended, but patient refuses.  Chronic cough x2 months has been worked up with CT chest and pulmonary consult.   Assessment/Plan: NSTEMI ASA/plavix/statin.  Refuses cath.   Cough x2 months productive of white sputum.  This is patients main complaint.  CT chest shows right sided ground glass opacities: asymmetric edema v.  infectious pneumonia (such as viral), chronic interstitial pneumonitis, sarcoidosis, or possibly lymphangitic spread of tumor. Repeat BNP was 340, down from 921. Weight has decreased to 266 from 280 with diuresis, but was in the 255-260 range 2 months ago. Discussed with pulmonary and cardiology. continue diuresis a bit more to dry weight while monitoring basic metabolic panels and get a high resolution CT chest as an inpatient.  Will give IV Lasix 80 mg twice a day today and monitor BMET. Has received 5 days of levofloxacin. Completed course.  Uncontrolled DM with intermittent hypoglycemia cbgs have been labile but starting to increase again. Continue sliding scale. Will resume glipizide at a lower dose and monitor. Will likely need lowering of oral hypoglycemic agents at discharge HgbA1C: 8  AKI Improved.  UTI? Urine culture with multiple morphotypes.   Levaquin course completed blood cultures negative  Acute on chronic systolic and diastolic heart failure, improving. See above  Obesity  Hyponatremia Secondary to heart failure, improving  Polyarthritis: Patient has pain in  multiple joints, including hands and knees wrists ankles and feet. She reports she has an appointment with Christine Knight next month. Per outpatient records has a history of osteoarthritis. Patient reports pain slightly improved on scheduled Tylenol. Rheumatoid factor borderline high, ANA pending, ESR 70, nonspecific. Physical therapy without specific recommendations  Code Status: full Family Communication: patient Disposition Plan: Home once diuresed down to dry weight and after high resolution CT chest   Consultants:  cards  Procedures:    HPI/Subjective: Breathing better  Objective: Filed Vitals:   06/06/15 0621  BP: 117/72  Pulse: 89  Temp: 97.9 F (36.6 C)  Resp: 18    Intake/Output Summary (Last 24 hours) at 06/06/15 1017 Last data filed at 06/06/15 0624  Gross per 24 hour  Intake    363 ml  Output    700 ml  Net   -337 ml   Filed Weights   06/04/15 0500 06/05/15 0643 06/06/15 0621  Weight: 122.154 kg (269 lb 4.8 oz) 121.065 kg (266 lb 14.4 oz) 121 kg (266 lb 12.1 oz)    Exam:   General:  in chair. Alert oriented.   Cardiovascular: rrr without MGR  Respiratory: diminished, without wheezes rhonchi or rales   Abdomen: +Bs, soft  Musculoskeletal:  trace pitting edema. Joint exam without warmth or significant tenderness.   Data Reviewed: Basic Metabolic Panel:  Recent Labs Lab 06/01/15 0942 06/02/15 0349 06/03/15 0418 06/04/15 0546 06/06/15 0446  NA 127* 130* 133* 133* 134*  K 4.4 4.4 4.3 4.2 4.0  CL 88* 91* 92* 95* 98*  CO2 27 29 32 29 28  GLUCOSE 374* 204* 77 163* 210*  BUN 37* 38* 35* 24* 22*  CREATININE 1.12* 1.30* 1.16* 1.01* 1.06*  CALCIUM 8.8* 9.0 9.0 9.2 9.4   Liver Function Tests:  Recent Labs Lab 05/31/15 0914 06/02/15 0349  AST 92* 56*  ALT 70* 56*  ALKPHOS 142* 141*  BILITOT 1.4* 1.0  PROT 7.6 7.7  ALBUMIN 3.1* 2.8*   No results for input(s): LIPASE, AMYLASE in the last 168 hours. No results for input(s): AMMONIA in  the last 168 hours. CBC:  Recent Labs Lab 05/31/15 0914 06/01/15 0942 06/02/15 0349  WBC 5.1 5.0 5.7  HGB 14.9 13.1 12.8  HCT 42.6 39.0 38.0  MCV 85.9 88.4 88.4  PLT 160 227 247   Cardiac Enzymes: No results for input(s): CKTOTAL, CKMB, CKMBINDEX, TROPONINI in the last 168 hours. BNP (last 3 results)  Recent Labs  03/13/15 1135 05/29/15 1608 06/04/15 0945  BNP 750.3* 921.7* 340.1*    ProBNP (last 3 results) No results for input(s): PROBNP in the last 8760 hours.  CBG:  Recent Labs Lab 06/05/15 0743 06/05/15 1131 06/05/15 1704 06/05/15 2124 06/06/15 0747  GLUCAP 222* 280* 151* 178* 246*    Recent Results (from the past 240 hour(s))  Culture, Urine     Status: None   Collection Time: 05/29/15  2:04 PM  Result Value Ref Range Status   Specimen Description URINE, CLEAN CATCH  Final   Special Requests NONE  Final   Culture   Final    MULTIPLE SPECIES PRESENT, SUGGEST RECOLLECTION IF CLINICALLY INDICATED   Report Status 05/31/2015 FINAL  Final  MRSA PCR Screening     Status: None   Collection Time: 05/29/15  7:20 PM  Result Value Ref Range Status   MRSA by PCR NEGATIVE NEGATIVE Final    Comment:        The GeneXpert MRSA Assay (FDA approved for NASAL specimens only), is one component of a comprehensive MRSA colonization surveillance program. It is not intended to diagnose MRSA infection nor to guide or monitor treatment for MRSA infections.   Culture, blood (routine x 2)     Status: None   Collection Time: 05/30/15  8:50 AM  Result Value Ref Range Status   Specimen Description BLOOD LEFT HAND  Final   Special Requests BOTTLES DRAWN AEROBIC ONLY  3CC  Final   Culture NO GROWTH 5 DAYS  Final   Report Status 06/04/2015 FINAL  Final     Studies: Ct Chest Wo Contrast  06/03/2015   CLINICAL DATA:  Shortness of breath and worsening cough for 2 months. History of ischemic cardiomyopathy and heart failure.  EXAM: CT CHEST WITHOUT CONTRAST  TECHNIQUE:  Multidetector CT imaging of the chest was performed following the standard protocol without IV contrast.  COMPARISON:  Chest radiograph 06/01/2015  FINDINGS: No enlarged axillary or hilar lymph nodes are identified. Borderline enlarged mediastinal lymph nodes measure up to 9 mm in short axis. The heart is mildly enlarged. Sequelae of prior CABG are identified, and there are 3 vessel coronary artery calcifications. There is no pleural or pericardial effusion. A small amount of mucus/debris is present at the left mainstem bronchus orifice.  There are rather extensive areas of ground-glass opacity and interlobular septal thickening throughout the right lung, located greater peripherally than centrally and with evidence of some nodularity. The left lung is largely spared, although there are some mild areas of peripheral reticulation, predominantly in the left upper lobe, and minimal subpleural ground-glass opacity. There is a 3.5 cm confluent opacity in the posterior left lower lobe which extends to  the pleural surface with evidence of volume loss in the left lower lobe.  The visualized portion of the upper abdomen is unremarkable. Disc degeneration and vacuum disc phenomenon is seen at multiple levels in the lower thoracic and visualized upper lumbar spine.  IMPRESSION: 1. Ground-glass opacities and interlobular septal thickening with some nodularity, predominantly involving the right lung and nonspecific. There may be a component of asymmetric pulmonary edema, however additional considerations include infectious pneumonia (such as viral), chronic interstitial pneumonitis, sarcoidosis, or possibly lymphangitic spread of tumor. Further evaluation with high-resolution chest CT as an outpatient is suggested. 2. Left lower lobe round atelectasis.   Electronically Signed   By: Logan Bores   On: 06/03/2015 15:35    Scheduled Meds: . acetaminophen  650 mg Oral TID  . albuterol  2.5 mg Nebulization BID  . aspirin  81 mg  Oral Daily  . atorvastatin  80 mg Oral q1800  . benzonatate  100 mg Oral TID  . clopidogrel  75 mg Oral Daily  . dextromethorphan-guaiFENesin  1 tablet Oral BID  . furosemide  80 mg Intravenous BID  . glipiZIDE  5 mg Oral QAC breakfast  . heparin  5,000 Units Subcutaneous 3 times per day  . insulin aspart  0-15 Units Subcutaneous TID WC  . insulin aspart  0-5 Units Subcutaneous QHS  . sodium chloride  3 mL Intravenous Q12H  . sodium chloride  3 mL Intravenous Q12H   Continuous Infusions:  Antibiotics Given (last 72 hours)    Date/Time Action Medication Dose   06/04/15 0822 Given   levofloxacin (LEVAQUIN) tablet 750 mg 750 mg      Principal Problem:   Acute on chronic systolic CHF (congestive heart failure) Active Problems:   Obesity, morbid-BMI 45   Hyperlipidemia LDL goal <70   Type 2 diabetes mellitus, uncontrolled   Hx of CABG 2012   Ischemic cardiomyopathy-30-35% May 2016   Pulmonary hypertension   Hypothyroidism   Chronic systolic heart failure   Spironolactone adverse reaction   Painful diabetic neuropathy   DJD (degenerative joint disease), multiple sites   Cough   NSTEMI- declined cath   Hypoglycemia on adm 7/18   Hyponatremia   Pressure ulcer   Renal insufficiency  Time spent: 25 min  Osa Fogarty  [facility] [email or web address], password [facility] 06/06/2015, 10:17 AM  LOS: 8 days

## 2015-06-06 NOTE — Progress Notes (Signed)
SUBJECTIVE: The patient is feeling a little better. Her diuresis is only modest. Renal function is stable. When we have diuresed her optimally, she will have a high sensitivity chest CT   Filed Vitals:   06/05/15 1300 06/05/15 2029 06/05/15 2044 06/06/15 0621  BP: 103/57  121/71 117/72  Pulse: 71  78 89  Temp: 98 F (36.7 C)  97.8 F (36.6 C) 97.9 F (36.6 C)  TempSrc: Oral  Oral Oral  Resp: Height:      Weight:    266 lb 12.1 oz (121 kg)  SpO2: 97% 98% 99% 97%     Intake/Output Summary (Last 24 hours) at 06/06/15 1100 Last data filed at 06/06/15 0624  Gross per 24 hour  Intake    363 ml  Output    700 ml  Net   -337 ml    LABS: Basic Metabolic Panel:  Recent Labs  16/10/96 0546 06/06/15 0446  NA 133* 134*  K 4.2 4.0  CL 95* 98*  CO2 29 28  GLUCOSE 163* 210*  BUN 24* 22*  CREATININE 1.01* 1.06*  CALCIUM 9.2 9.4   Liver Function Tests: No results for input(s): AST, ALT, ALKPHOS, BILITOT, PROT, ALBUMIN in the last 72 hours. No results for input(s): LIPASE, AMYLASE in the last 72 hours. CBC: No results for input(s): WBC, NEUTROABS, HGB, HCT, MCV, PLT in the last 72 hours. Cardiac Enzymes: No results for input(s): CKTOTAL, CKMB, CKMBINDEX, TROPONINI in the last 72 hours. BNP: Invalid input(s): POCBNP D-Dimer: No results for input(s): DDIMER in the last 72 hours. Hemoglobin A1C: No results for input(s): HGBA1C in the last 72 hours. Fasting Lipid Panel: No results for input(s): CHOL, HDL, LDLCALC, TRIG, CHOLHDL, LDLDIRECT in the last 72 hours. Thyroid Function Tests: No results for input(s): TSH, T4TOTAL, T3FREE, THYROIDAB in the last 72 hours.  Invalid input(s): FREET3  RADIOLOGY: Dg Chest 2 View  05/29/2015   CLINICAL DATA:  Cough, congestion, wheezing and fever today.  EXAM: CHEST  2 VIEW  COMPARISON:  May 16, 2015  FINDINGS: The heart size and mediastinal contours are stable. The heart size is enlarged. Patient status post prior CABG and  median sternotomy. There is pulmonary edema. There is no focal pneumonia. There is no pleural effusion. The visualized skeletal structures are stable.  IMPRESSION: Congestive heart failure.   Electronically Signed   By: Sherian Rein M.D.   On: 05/29/2015 14:50   Dg Chest 2 View  05/16/2015   CLINICAL DATA:  64 year old female with cough and congestion, shortness of breath and low grade fever x2 months  EXAM: CHEST  2 VIEW  COMPARISON:  Chest radiograph dated 03/13/2015  FINDINGS: Two views of the chest again demonstrates mild central vascular prominence similar to prior study. There is no focal consolidation. There is minimal blunting of the left costophrenic angle. No pneumothorax. Median sternotomy wires and CABG clips noted. Stable mild cardiomegaly. The osseous structures appear unremarkable.  IMPRESSION: No significant interval change. Stable central vascular prominence. No focal consolidation.   Electronically Signed   By: Elgie Collard M.D.   On: 05/16/2015 16:53   Ct Head Wo Contrast  05/29/2015   CLINICAL DATA:  Generalized weakness for 1 week, slurred speech.  EXAM: CT HEAD WITHOUT CONTRAST  TECHNIQUE: Contiguous axial images were obtained from the base of the skull through the vertex without intravenous contrast.  COMPARISON:  MRI 12/19/2013  FINDINGS: No acute intracranial abnormality. Specifically, no hemorrhage, hydrocephalus,  mass lesion, acute infarction, or significant intracranial injury. No acute calvarial abnormality. Visualized paranasal sinuses and mastoids clear. Orbital soft tissues unremarkable.  IMPRESSION: Negative.   Electronically Signed   By: Charlett Nose M.D.   On: 05/29/2015 12:14   Ct Chest Wo Contrast  06/03/2015   CLINICAL DATA:  Shortness of breath and worsening cough for 2 months. History of ischemic cardiomyopathy and heart failure.  EXAM: CT CHEST WITHOUT CONTRAST  TECHNIQUE: Multidetector CT imaging of the chest was performed following the standard protocol without  IV contrast.  COMPARISON:  Chest radiograph 06/01/2015  FINDINGS: No enlarged axillary or hilar lymph nodes are identified. Borderline enlarged mediastinal lymph nodes measure up to 9 mm in short axis. The heart is mildly enlarged. Sequelae of prior CABG are identified, and there are 3 vessel coronary artery calcifications. There is no pleural or pericardial effusion. A small amount of mucus/debris is present at the left mainstem bronchus orifice.  There are rather extensive areas of ground-glass opacity and interlobular septal thickening throughout the right lung, located greater peripherally than centrally and with evidence of some nodularity. The left lung is largely spared, although there are some mild areas of peripheral reticulation, predominantly in the left upper lobe, and minimal subpleural ground-glass opacity. There is a 3.5 cm confluent opacity in the posterior left lower lobe which extends to the pleural surface with evidence of volume loss in the left lower lobe.  The visualized portion of the upper abdomen is unremarkable. Disc degeneration and vacuum disc phenomenon is seen at multiple levels in the lower thoracic and visualized upper lumbar spine.  IMPRESSION: 1. Ground-glass opacities and interlobular septal thickening with some nodularity, predominantly involving the right lung and nonspecific. There may be a component of asymmetric pulmonary edema, however additional considerations include infectious pneumonia (such as viral), chronic interstitial pneumonitis, sarcoidosis, or possibly lymphangitic spread of tumor. Further evaluation with high-resolution chest CT as an outpatient is suggested. 2. Left lower lobe round atelectasis.   Electronically Signed   By: Sebastian Ache   On: 06/03/2015 15:35   Dg Chest Port 1 View  06/01/2015   CLINICAL DATA:  Cough congestion, and wheezing since May of 2016  EXAM: PORTABLE CHEST - 1 VIEW  COMPARISON:  Chest x-ray of May 29, 2015 and May 16, 2015.   FINDINGS: The lungs are adequately inflated. There remain increased interstitial densities bilaterally. These have slightly improved since the previous study. Pulmonary vascular congestion is also slightly decreased. The cardiac silhouette remains enlarged but its margins are more distinct. The patient has undergone previous CABG. There are 8 intact sternal wires. The mediastinum is normal in width. The bony thorax exhibits no acute abnormality.  IMPRESSION: Slight interval improvement in pulmonary interstitial edema secondary to CHF. There is no focal pneumonia.   Electronically Signed   By: David  [place] M.D.   On: 06/01/2015 08:22    PHYSICAL EXAm  Lungs are clear. Respiratory effort is nonlabored. Cardiac exam reveals S1 and S2. She does not have pitting edema.   TELEMETRY:   I have reviewed telemetry today June 05, 2015. There is normal sinus rhythm.ASSESSMENT AND PLAN:    Acute on chronic systolic CHF (congestive heart failure)     Assessment of her volume status is always difficult. We will continue with the outlined plan. Diuresis is to be continued watching renal function. When she is as dry as safely possible, she will have a high sensitivity chest CT  Active Problems:   Obesity,  morbid-BMI 45   Hyperlipidemia LDL goal <70   Type 2 diabetes mellitus, uncontrolled   Hx of CABG 2012   Ischemic cardiomyopathy-30-35% May 2016   Pulmonary hypertension   Hypothyroidism   Chronic systolic heart failure   Spironolactone adverse reaction   Painful diabetic neuropathy   DJD (degenerative joint disease), multiple sites   Cough   NSTEMI- declined cath   Hypoglycemia on adm 7/18   Hyponatremia   Pressure ulcer   Renal insufficiency   Willa Rough 06/06/2015 11:00 AM

## 2015-06-06 NOTE — Progress Notes (Signed)
Inpatient Diabetes Program Recommendations  AACE/ADA: New Consensus Statement on Inpatient Glycemic Control (2013)  Target Ranges:  Prepandial:   less than 140 mg/dL      Peak postprandial:   less than 180 mg/dL (1-2 hours)      Critically ill patients:  140 - 180 mg/dL   Results for Christine Knight, Christine Knight (MRN [id number]) as of 06/06/2015 13:18  Ref. Range 06/05/2015 11:31 06/05/2015 17:04 06/05/2015 21:24 06/06/2015 07:47 06/06/2015 11:01  Glucose-Capillary Latest Ref Range: 65-99 mg/dL 409 (H) 811 (H) 914 (H) 246 (H) 250 (H)    Reason for assessment: elevated CBG   Diabetes history: type 2 Outpatient Diabetes medications: Glucotrol 10 mg bid, Janumet50/1000mg   1 tab bid  Current orders for Inpatient glycemic control: Glucotrol , Novolog 0-15 units tid, 0-5 units qhs  Consider starting the patient on Novolog 4 units tid with meals- this will allow patient to get consistent mealtime insulin.  Continue Novolog correction as ordered.   Susette Racer, RN, BA, MHA, CDE Diabetes Coordinator Inpatient Diabetes Program  [phone] (Team Pager) [phone] [facility]) 06/06/2015 1:25 PM

## 2015-06-07 LAB — BASIC METABOLIC PANEL
Anion gap: 7 (ref 5–15)
BUN: 25 mg/dL — ABNORMAL HIGH (ref 6–20)
CO2: 27 mmol/L (ref 22–32)
Calcium: 9.3 mg/dL (ref 8.9–10.3)
Chloride: 103 mmol/L (ref 101–111)
Creatinine, Ser: 0.95 mg/dL (ref 0.44–1.00)
GFR calc Af Amer: >60 mL/min (ref >60)
Glucose, Bld: 258 mg/dL — ABNORMAL HIGH (ref 65–99)
POTASSIUM: 4.1 mmol/L (ref 3.5–5.1)
Sodium: 137 mmol/L (ref 135–145)

## 2015-06-07 LAB — GLUCOSE, CAPILLARY
GLUCOSE-CAPILLARY: 241 mg/dL — AB (ref 65–99)
GLUCOSE-CAPILLARY: 309 mg/dL — AB (ref 65–99)
Glucose-Capillary: 199 mg/dL — ABNORMAL HIGH (ref 65–99)
Glucose-Capillary: 165 mg/dL — ABNORMAL HIGH (ref 65–99)

## 2015-06-07 MED ORDER — GLIPIZIDE 5 MG PO TABS
10.0000 mg | ORAL_TABLET | Freq: Every day | ORAL | Status: DC
  Administered 2015-06-08 – 2015-06-10 (×3): 10 mg via ORAL
  Filled 2015-06-07 (×3): qty 2

## 2015-06-07 MED ORDER — INSULIN ASPART 100 UNIT/ML ~~LOC~~ SOLN
5.0000 [IU] | Freq: Three times a day (TID) | SUBCUTANEOUS | Status: DC
Start: 2015-06-07 — End: 2015-06-10
  Administered 2015-06-07 – 2015-06-10 (×8): 5 [IU] via SUBCUTANEOUS

## 2015-06-07 NOTE — Progress Notes (Signed)
Inpatient Diabetes Program Recommendations  AACE/ADA: New Consensus Statement on Inpatient Glycemic Control (2013)  Target Ranges:  Prepandial:   less than 140 mg/dL      Peak postprandial:   less than 180 mg/dL (1-2 hours)      Critically ill patients:  140 - 180 mg/dL   CBGs elevated 295-621 range.  Pt would benefit from the addition of basal Lantus or Levemir.   Recommend basal 10 units and add Novolog meal coverage TID.  Thank you  Piedad Climes BSN, RN,CDE Inpatient Diabetes Coordinator [phone] (team pager)

## 2015-06-07 NOTE — Progress Notes (Signed)
PROGRESS NOTE  Christine Knight SWF:[id number] DOB: 09-May-1951 DOA: 05/29/2015 PCP: Scarlette Calico, MD  Christine Knight is a 64 y.o.female. who presented the emergency room  with altered mental status. Her glucose was 40. This was treated in her mental status improved. However her chest x-ray showed pulmonary edema and her troponin was elevated. She has known coronary disease. She has significant left ventricular dysfunction.  Recently she was hospitalized with CHF. Right and left heart cath was recommended, but patient refuses.  Chronic cough x2 months has been worked up with CT chest and pulmonary consult.   Assessment/Plan: NSTEMI ASA/plavix/statin.  Refuses cath.   Cough x2 months productive of white sputum.  This is patients main complaint.  CT chest shows right sided ground glass opacities: asymmetric edema v.  infectious pneumonia (such as viral), chronic interstitial pneumonitis, sarcoidosis, or possibly lymphangitic spread of tumor. Repeat BNP was 340, down from 921. Weight has decreased to 266 from 280 with diuresis, but was in the 255-260 range 2 months ago. Discussed with pulmonary and cardiology. continue diuresis a bit more to dry weight while monitoring basic metabolic panels and get a high resolution CT chest as an inpatient.  Will give IV Lasix 80 mg twice a day today and monitor BMET. Has received 5 days of levofloxacin. Completed course.  Uncontrolled DM with intermittent hypoglycemia cbgs have been labile but starting to increase again. Continue sliding scale. Increase glipizide HgbA1C: 8  AKI Improved.  UTI? Urine culture with multiple morphotypes.   Levaquin course completed blood cultures negative  Acute on chronic systolic and diastolic heart failure, improving. 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 She reports she has an  appointment with Dr. Estanislado Pandy next month. Per outpatient records has a history of osteoarthritis. Patient reports pain slightly improved on scheduled Tylenol. Rheumatoid factor borderline high, ANA negative, ESR 70, nonspecific. Physical therapy without specific recommendations  Code Status: full Family Communication: patient Disposition Plan: Home once diuresed down to dry weight and after high resolution CT chest- hope for Thursday   Consultants:  cards  Procedures:    HPI/Subjective: Breathing better  Objective: Filed Vitals:   06/07/15 0549  BP: 108/71  Pulse: 77  Temp: 97.9 F (36.6 C)  Resp: 18    Intake/Output Summary (Last 24 hours) at 06/07/15 1046 Last data filed at 06/07/15 0951  Gross per 24 hour  Intake    180 ml  Output      0 ml  Net    180 ml   Filed Weights   06/05/15 0643 06/06/15 0621 06/07/15 0549  Weight: 121.065 kg (266 lb 14.4 oz) 121 kg (266 lb 12.1 oz) 120.611 kg (265 lb 14.4 oz)    Exam:   General:  in chair. Alert oriented.   Cardiovascular: rrr without MGR  Respiratory: diminished, without wheezes rhonchi or rales   Abdomen: +Bs, soft  Musculoskeletal:  trace pitting edema. Joint exam without warmth or significant tenderness.   Data Reviewed: Basic Metabolic Panel:  Recent Labs Lab 06/02/15 0349 06/03/15 0418 06/04/15 0546 06/06/15 0446 06/07/15 0500  NA 130* 133* 133* 134* 137  K 4.4 4.3 4.2 4.0 4.1  CL 91* 92* 95* 98* 103  CO2 29 32 _0 GLUCOSE 204* 77 163* 210* 258*  BUN 38* 35* 24* 22* 25*  CREATININE 1.30* 1.16* 1.01* 1.06* 0.95  CALCIUM 9.0 9.0 9.2  9.4 9.3   Liver Function Tests:  Recent Labs Lab 06/02/15 0349  AST 56*  ALT 56*  ALKPHOS 141*  BILITOT 1.0  PROT 7.7  ALBUMIN 2.8*   No results for input(s): LIPASE, AMYLASE in the last 168 hours. No results for input(s): AMMONIA in the last 168 hours. CBC:  Recent Labs Lab 06/01/15 0942 06/02/15 0349  WBC 5.0 5.7  HGB 13.1 12.8  HCT 39.0  38.0  MCV 88.4 88.4  PLT 227 247   Cardiac Enzymes: No results for input(s): CKTOTAL, CKMB, CKMBINDEX, TROPONINI in the last 168 hours. BNP (last 3 results)  Recent Labs  03/13/15 1135 05/29/15 1608 06/04/15 0945  BNP 750.3* 921.7* 340.1*    ProBNP (last 3 results) No results for input(s): PROBNP in the last 8760 hours.  CBG:  Recent Labs Lab 06/06/15 0747 06/06/15 1101 06/06/15 1640 06/06/15 2047 06/07/15 0749  GLUCAP 246* 250* 290* 248* 241*    Recent Results (from the past 240 hour(s))  Culture, Urine     Status: None   Collection Time: 05/29/15  2:04 PM  Result Value Ref Range Status   Specimen Description URINE, CLEAN CATCH  Final   Special Requests NONE  Final   Culture   Final    MULTIPLE SPECIES PRESENT, SUGGEST RECOLLECTION IF CLINICALLY INDICATED   Report Status 05/31/2015 FINAL  Final  MRSA PCR Screening     Status: None   Collection Time: 05/29/15  7:20 PM  Result Value Ref Range Status   MRSA by PCR NEGATIVE NEGATIVE Final    Comment:        The GeneXpert MRSA Assay (FDA approved for NASAL specimens only), is one component of a comprehensive MRSA colonization surveillance program. It is not intended to diagnose MRSA infection nor to guide or monitor treatment for MRSA infections.   Culture, blood (routine x 2)     Status: None   Collection Time: 05/30/15  8:50 AM  Result Value Ref Range Status   Specimen Description BLOOD LEFT HAND  Final   Special Requests BOTTLES DRAWN AEROBIC ONLY  3CC  Final   Culture NO GROWTH 5 DAYS  Final   Report Status 06/04/2015 FINAL  Final     Studies: Ct Chest Wo Contrast  06/03/2015   CLINICAL DATA:  Shortness of breath and worsening cough for 2 months. History of ischemic cardiomyopathy and heart failure.  EXAM: CT CHEST WITHOUT CONTRAST  TECHNIQUE: Multidetector CT imaging of the chest was performed following the standard protocol without IV contrast.  COMPARISON:  Chest radiograph 06/01/2015  FINDINGS: No  enlarged axillary or hilar lymph nodes are identified. Borderline enlarged mediastinal lymph nodes measure up to 9 mm in short axis. The heart is mildly enlarged. Sequelae of prior CABG are identified, and there are 3 vessel coronary artery calcifications. There is no pleural or pericardial effusion. A small amount of mucus/debris is present at the left mainstem bronchus orifice.  There are rather extensive areas of ground-glass opacity and interlobular septal thickening throughout the right lung, located greater peripherally than centrally and with evidence of some nodularity. The left lung is largely spared, although there are some mild areas of peripheral reticulation, predominantly in the left upper lobe, and minimal subpleural ground-glass opacity. There is a 3.5 cm confluent opacity in the posterior left lower lobe which extends to the pleural surface with evidence of volume loss in the left lower lobe.  The visualized portion of the upper abdomen is unremarkable. Disc degeneration and  vacuum disc phenomenon is seen at multiple levels in the lower thoracic and visualized upper lumbar spine.  IMPRESSION: 1. Ground-glass opacities and interlobular septal thickening with some nodularity, predominantly involving the right lung and nonspecific. There may be a component of asymmetric pulmonary edema, however additional considerations include infectious pneumonia (such as viral), chronic interstitial pneumonitis, sarcoidosis, or possibly lymphangitic spread of tumor. Further evaluation with high-resolution chest CT as an outpatient is suggested. 2. Left lower lobe round atelectasis.   Electronically Signed   By: Logan Bores   On: 06/03/2015 15:35    Scheduled Meds: . acetaminophen  650 mg Oral TID  . albuterol  2.5 mg Nebulization BID  . aspirin  81 mg Oral Daily  . atorvastatin  80 mg Oral q1800  . benzonatate  100 mg Oral TID  . clopidogrel  75 mg Oral Daily  . dextromethorphan-guaiFENesin  1 tablet Oral  BID  . furosemide  80 mg Intravenous BID  . glipiZIDE  5 mg Oral QAC breakfast  . heparin  5,000 Units Subcutaneous 3 times per day  . insulin aspart  0-15 Units Subcutaneous TID WC  . insulin aspart  0-5 Units Subcutaneous QHS  . sodium chloride  3 mL Intravenous Q12H  . sodium chloride  3 mL Intravenous Q12H   Continuous Infusions:  Antibiotics Given (last 72 hours)    None      Principal Problem:   Acute on chronic systolic CHF (congestive heart failure) Active Problems:   Obesity, morbid-BMI 45   Hyperlipidemia LDL goal <70   Type 2 diabetes mellitus, uncontrolled   Hx of CABG 2012   Ischemic cardiomyopathy-30-35% May 2016   Pulmonary hypertension   Hypothyroidism   Chronic systolic heart failure   Spironolactone adverse reaction   Painful diabetic neuropathy   DJD (degenerative joint disease), multiple sites   Cough   NSTEMI- declined cath   Hypoglycemia on adm 7/18   Hyponatremia   Pressure ulcer   Renal insufficiency  Time spent: 25 min  Marjani Kobel  [facility] [email or web address], password [email or web address] 06/07/2015, 10:46 AM  LOS: 9 days

## 2015-06-07 NOTE — Progress Notes (Signed)
SUBJECTIVE   patient continues to improve. Her breathing is better. She still has a cough but it is somewhat improved. Her weight is decreasing. I cannot completely document her input and output. I have ordered strict I&O. Chemistry lab this morning shows that her renal function is stable since yesterday. Her creatinine is actually slightly lower. She is tolerating the current diuresis.   Filed Vitals:   06/06/15 1436 06/06/15 2034 06/06/15 2120 06/07/15 0549  BP: 106/65 118/70  108/71  Pulse: 73 72  77  Temp: 97.7 F (36.5 C) 98.2 F (36.8 C)  97.9 F (36.6 C)  TempSrc: Axillary Oral  Oral  Resp:  18  18  Height:      Weight:    265 lb 14.4 oz (120.611 kg)  SpO2: 100% 97% 97% 95%    No intake or output data in the 24 hours ending 06/07/15 0826  LABS: Basic Metabolic Panel:  Recent Labs  16/10/96 0446 06/07/15 0500  NA 134* 137  K 4.0 4.1  CL 98* 103  CO2 28 27  GLUCOSE 210* 258*  BUN 22* 25*  CREATININE 1.06* 0.95  CALCIUM 9.4 9.3   Liver Function Tests: No results for input(s): AST, ALT, ALKPHOS, BILITOT, PROT, ALBUMIN in the last 72 hours. No results for input(s): LIPASE, AMYLASE in the last 72 hours. CBC: No results for input(s): WBC, NEUTROABS, HGB, HCT, MCV, PLT in the last 72 hours. Cardiac Enzymes: No results for input(s): CKTOTAL, CKMB, CKMBINDEX, TROPONINI in the last 72 hours. BNP: Invalid input(s): POCBNP D-Dimer: No results for input(s): DDIMER in the last 72 hours. Hemoglobin A1C: No results for input(s): HGBA1C in the last 72 hours. Fasting Lipid Panel: No results for input(s): CHOL, HDL, LDLCALC, TRIG, CHOLHDL, LDLDIRECT in the last 72 hours. Thyroid Function Tests: No results for input(s): TSH, T4TOTAL, T3FREE, THYROIDAB in the last 72 hours.  Invalid input(s): FREET3  RADIOLOGY: Dg Chest 2 View  05/29/2015   CLINICAL DATA:  Cough, congestion, wheezing and fever today.  EXAM: CHEST  2 VIEW  COMPARISON:  May 16, 2015  FINDINGS: The heart  size and mediastinal contours are stable. The heart size is enlarged. Patient status post prior CABG and median sternotomy. There is pulmonary edema. There is no focal pneumonia. There is no pleural effusion. The visualized skeletal structures are stable.  IMPRESSION: Congestive heart failure.   Electronically Signed   By: Sherian Rein M.D.   On: 05/29/2015 14:50   Dg Chest 2 View  05/16/2015   CLINICAL DATA:  64 year old female with cough and congestion, shortness of breath and low grade fever x2 months  EXAM: CHEST  2 VIEW  COMPARISON:  Chest radiograph dated 03/13/2015  FINDINGS: Two views of the chest again demonstrates mild central vascular prominence similar to prior study. There is no focal consolidation. There is minimal blunting of the left costophrenic angle. No pneumothorax. Median sternotomy wires and CABG clips noted. Stable mild cardiomegaly. The osseous structures appear unremarkable.  IMPRESSION: No significant interval change. Stable central vascular prominence. No focal consolidation.   Electronically Signed   By: Elgie Collard M.D.   On: 05/16/2015 16:53   Ct Head Wo Contrast  05/29/2015   CLINICAL DATA:  Generalized weakness for 1 week, slurred speech.  EXAM: CT HEAD WITHOUT CONTRAST  TECHNIQUE: Contiguous axial images were obtained from the base of the skull through the vertex without intravenous contrast.  COMPARISON:  MRI 12/19/2013  FINDINGS: No acute intracranial abnormality. Specifically, no  hemorrhage, hydrocephalus, mass lesion, acute infarction, or significant intracranial injury. No acute calvarial abnormality. Visualized paranasal sinuses and mastoids clear. Orbital soft tissues unremarkable.  IMPRESSION: Negative.   Electronically Signed   By: Charlett Nose M.D.   On: 05/29/2015 12:14   Ct Chest Wo Contrast  06/03/2015   CLINICAL DATA:  Shortness of breath and worsening cough for 2 months. History of ischemic cardiomyopathy and heart failure.  EXAM: CT CHEST WITHOUT  CONTRAST  TECHNIQUE: Multidetector CT imaging of the chest was performed following the standard protocol without IV contrast.  COMPARISON:  Chest radiograph 06/01/2015  FINDINGS: No enlarged axillary or hilar lymph nodes are identified. Borderline enlarged mediastinal lymph nodes measure up to 9 mm in short axis. The heart is mildly enlarged. Sequelae of prior CABG are identified, and there are 3 vessel coronary artery calcifications. There is no pleural or pericardial effusion. A small amount of mucus/debris is present at the left mainstem bronchus orifice.  There are rather extensive areas of ground-glass opacity and interlobular septal thickening throughout the right lung, located greater peripherally than centrally and with evidence of some nodularity. The left lung is largely spared, although there are some mild areas of peripheral reticulation, predominantly in the left upper lobe, and minimal subpleural ground-glass opacity. There is a 3.5 cm confluent opacity in the posterior left lower lobe which extends to the pleural surface with evidence of volume loss in the left lower lobe.  The visualized portion of the upper abdomen is unremarkable. Disc degeneration and vacuum disc phenomenon is seen at multiple levels in the lower thoracic and visualized upper lumbar spine.  IMPRESSION: 1. Ground-glass opacities and interlobular septal thickening with some nodularity, predominantly involving the right lung and nonspecific. There may be a component of asymmetric pulmonary edema, however additional considerations include infectious pneumonia (such as viral), chronic interstitial pneumonitis, sarcoidosis, or possibly lymphangitic spread of tumor. Further evaluation with high-resolution chest CT as an outpatient is suggested. 2. Left lower lobe round atelectasis.   Electronically Signed   By: Sebastian Ache   On: 06/03/2015 15:35   Dg Chest Port 1 View  06/01/2015   CLINICAL DATA:  Cough congestion, and wheezing since  May of 2016  EXAM: PORTABLE CHEST - 1 VIEW  COMPARISON:  Chest x-ray of May 29, 2015 and May 16, 2015.  FINDINGS: The lungs are adequately inflated. There remain increased interstitial densities bilaterally. These have slightly improved since the previous study. Pulmonary vascular congestion is also slightly decreased. The cardiac silhouette remains enlarged but its margins are more distinct. The patient has undergone previous CABG. There are 8 intact sternal wires. The mediastinum is normal in width. The bony thorax exhibits no acute abnormality.  IMPRESSION: Slight interval improvement in pulmonary interstitial edema secondary to CHF. There is no focal pneumonia.   Electronically Signed   By: David  [place] M.D.   On: 06/01/2015 08:22    PHYSICAL EXAM   patient is oriented to person time and place. Affect is normal. Head is atraumatic. Sclera and conjunctiva are normal. Lungs reveals few rhonchi in the left lung field. Cardiac exam reveals S1 and S2. The patient does not appear to have any significant pitting edema.  TELEMETRY: I have reviewed telemetry today June 07, 2015. There is normal sinus rhythm.   ASSESSMENT AND PLAN:     Acute on chronic systolic CHF (congestive heart failure)    The plan is to continue her diuresis. She has had several hospitalizations recently. The plan now  is to keep her in the hospital until we are sure that she has been maximally diuresed. After that, she needs high-resolution CT to complete her pulmonary evaluation.    Obesity, morbid-BMI 45   Hyperlipidemia LDL goal <70   Type 2 diabetes mellitus, uncontrolled    Hx of CABG 2012   Ischemic cardiomyopathy-30-35% May 2016    Willa Rough 06/07/2015 8:26 AM

## 2015-06-08 LAB — BASIC METABOLIC PANEL
Anion gap: 9 (ref 5–15)
BUN: 21 mg/dL — ABNORMAL HIGH (ref 6–20)
CO2: 26 mmol/L (ref 22–32)
CREATININE: 0.88 mg/dL (ref 0.44–1.00)
Calcium: 9.3 mg/dL (ref 8.9–10.3)
Chloride: 103 mmol/L (ref 101–111)
GFR calc non Af Amer: >60 mL/min (ref >60)
Glucose, Bld: 189 mg/dL — ABNORMAL HIGH (ref 65–99)
POTASSIUM: 4 mmol/L (ref 3.5–5.1)
SODIUM: 138 mmol/L (ref 135–145)

## 2015-06-08 LAB — GLUCOSE, CAPILLARY
GLUCOSE-CAPILLARY: 246 mg/dL — AB (ref 65–99)
GLUCOSE-CAPILLARY: 336 mg/dL — AB (ref 65–99)
GLUCOSE-CAPILLARY: 122 mg/dL — AB (ref 65–99)
GLUCOSE-CAPILLARY: 94 mg/dL (ref 65–99)

## 2015-06-08 NOTE — Progress Notes (Signed)
SUBJECTIVE: The patient has had significant diuresis. She clearly continues to feel better.   Filed Vitals:   06/07/15 2020 06/07/15 2107 06/08/15 0425 06/08/15 0947  BP: 128/74  133/79   Pulse: 76 72 83   Temp: 98 F (36.7 C)  97.7 F (36.5 C)   TempSrc: Oral  Oral   Resp: 18 16 18    Height:      Weight:   266 lb 12.8 oz (121.02 kg)   SpO2: 98%  95% 93%     Intake/Output Summary (Last 24 hours) at 06/08/15 1126 Last data filed at 06/08/15 0845  Gross per 24 hour  Intake    900 ml  Output   3500 ml  Net  -2600 ml    LABS: Basic Metabolic Panel:  Recent Labs  16/10/96 0500 06/08/15 0414  NA 137 138  K 4.1 4.0  CL 103 103  CO2 27 26  GLUCOSE 258* 189*  BUN 25* 21*  CREATININE 0.95 0.88  CALCIUM 9.3 9.3   Liver Function Tests: No results for input(s): AST, ALT, ALKPHOS, BILITOT, PROT, ALBUMIN in the last 72 hours. No results for input(s): LIPASE, AMYLASE in the last 72 hours. CBC: No results for input(s): WBC, NEUTROABS, HGB, HCT, MCV, PLT in the last 72 hours. Cardiac Enzymes: No results for input(s): CKTOTAL, CKMB, CKMBINDEX, TROPONINI in the last 72 hours. BNP: Invalid input(s): POCBNP D-Dimer: No results for input(s): DDIMER in the last 72 hours. Hemoglobin A1C: No results for input(s): HGBA1C in the last 72 hours. Fasting Lipid Panel: No results for input(s): CHOL, HDL, LDLCALC, TRIG, CHOLHDL, LDLDIRECT in the last 72 hours. Thyroid Function Tests: No results for input(s): TSH, T4TOTAL, T3FREE, THYROIDAB in the last 72 hours.  Invalid input(s): FREET3  RADIOLOGY: Dg Chest 2 View  05/29/2015   CLINICAL DATA:  Cough, congestion, wheezing and fever today.  EXAM: CHEST  2 VIEW  COMPARISON:  May 16, 2015  FINDINGS: The heart size and mediastinal contours are stable. The heart size is enlarged. Patient status post prior CABG and median sternotomy. There is pulmonary edema. There is no focal pneumonia. There is no pleural effusion. The visualized  skeletal structures are stable.  IMPRESSION: Congestive heart failure.   Electronically Signed   By: Sherian Rein M.D.   On: 05/29/2015 14:50   Dg Chest 2 View  05/16/2015   CLINICAL DATA:  64 year old female with cough and congestion, shortness of breath and low grade fever x2 months  EXAM: CHEST  2 VIEW  COMPARISON:  Chest radiograph dated 03/13/2015  FINDINGS: Two views of the chest again demonstrates mild central vascular prominence similar to prior study. There is no focal consolidation. There is minimal blunting of the left costophrenic angle. No pneumothorax. Median sternotomy wires and CABG clips noted. Stable mild cardiomegaly. The osseous structures appear unremarkable.  IMPRESSION: No significant interval change. Stable central vascular prominence. No focal consolidation.   Electronically Signed   By: Elgie Collard M.D.   On: 05/16/2015 16:53   Ct Head Wo Contrast  05/29/2015   CLINICAL DATA:  Generalized weakness for 1 week, slurred speech.  EXAM: CT HEAD WITHOUT CONTRAST  TECHNIQUE: Contiguous axial images were obtained from the base of the skull through the vertex without intravenous contrast.  COMPARISON:  MRI 12/19/2013  FINDINGS: No acute intracranial abnormality. Specifically, no hemorrhage, hydrocephalus, mass lesion, acute infarction, or significant intracranial injury. No acute calvarial abnormality. Visualized paranasal sinuses and mastoids clear. Orbital soft tissues unremarkable.  IMPRESSION: Negative.   Electronically Signed   By: Charlett Nose M.D.   On: 05/29/2015 12:14   Ct Chest Wo Contrast  06/03/2015   CLINICAL DATA:  Shortness of breath and worsening cough for 2 months. History of ischemic cardiomyopathy and heart failure.  EXAM: CT CHEST WITHOUT CONTRAST  TECHNIQUE: Multidetector CT imaging of the chest was performed following the standard protocol without IV contrast.  COMPARISON:  Chest radiograph 06/01/2015  FINDINGS: No enlarged axillary or hilar lymph nodes are  identified. Borderline enlarged mediastinal lymph nodes measure up to 9 mm in short axis. The heart is mildly enlarged. Sequelae of prior CABG are identified, and there are 3 vessel coronary artery calcifications. There is no pleural or pericardial effusion. A small amount of mucus/debris is present at the left mainstem bronchus orifice.  There are rather extensive areas of ground-glass opacity and interlobular septal thickening throughout the right lung, located greater peripherally than centrally and with evidence of some nodularity. The left lung is largely spared, although there are some mild areas of peripheral reticulation, predominantly in the left upper lobe, and minimal subpleural ground-glass opacity. There is a 3.5 cm confluent opacity in the posterior left lower lobe which extends to the pleural surface with evidence of volume loss in the left lower lobe.  The visualized portion of the upper abdomen is unremarkable. Disc degeneration and vacuum disc phenomenon is seen at multiple levels in the lower thoracic and visualized upper lumbar spine.  IMPRESSION: 1. Ground-glass opacities and interlobular septal thickening with some nodularity, predominantly involving the right lung and nonspecific. There may be a component of asymmetric pulmonary edema, however additional considerations include infectious pneumonia (such as viral), chronic interstitial pneumonitis, sarcoidosis, or possibly lymphangitic spread of tumor. Further evaluation with high-resolution chest CT as an outpatient is suggested. 2. Left lower lobe round atelectasis.   Electronically Signed   By: Sebastian Ache   On: 06/03/2015 15:35   Dg Chest Port 1 View  06/01/2015   CLINICAL DATA:  Cough congestion, and wheezing since May of 2016  EXAM: PORTABLE CHEST - 1 VIEW  COMPARISON:  Chest x-ray of May 29, 2015 and May 16, 2015.  FINDINGS: The lungs are adequately inflated. There remain increased interstitial densities bilaterally. These have  slightly improved since the previous study. Pulmonary vascular congestion is also slightly decreased. The cardiac silhouette remains enlarged but its margins are more distinct. The patient has undergone previous CABG. There are 8 intact sternal wires. The mediastinum is normal in width. The bony thorax exhibits no acute abnormality.  IMPRESSION: Slight interval improvement in pulmonary interstitial edema secondary to CHF. There is no focal pneumonia.   Electronically Signed   By: David  [place] M.D.   On: 06/01/2015 08:22    PHYSICAL EXAM  patient is oriented to person time and place. Affect is normal. Lungs reveal scattered rhonchi. Cardiac exam reveals an S1 and S2. There may be trace peripheral edema.   TELEMETRY: I have reviewed telemetry today July 28. There is normal sinus rhythm.  ASSESSMENT AND PLAN:     Acute on chronic systolic CHF (congestive heart failure)    This evaluation has been difficult. However we decided to push diuresis until we are absolutely sure that she wasn't dry weight. This turns out to have been a very good decision. She had significant diuresis yesterday and she feels better and better. The plan will be to continue this approach until we are sure that she is at dry weight.  We will then do a high-resolution CT to complete the evaluation.    Renal insufficiency     Renal function is stable.   Willa Rough 06/08/2015 11:26 AM

## 2015-06-08 NOTE — Progress Notes (Signed)
PROGRESS NOTE  Mykala Mccready IPJ:[id number] DOB: 1951-08-28 DOA: 05/29/2015 PCP: Scarlette Calico, MD  Ms. Barretta is a 64 y.o.female. who presented the emergency room  with altered mental status. Her glucose was 40. This was treated in her mental status improved. However her chest x-ray showed pulmonary edema and her troponin was elevated. She has known coronary disease. She has significant left ventricular dysfunction.  Recently she was hospitalized with CHF. Right and left heart cath was recommended, but patient refuses.  Chronic cough x2 months has been worked up with CT chest and pulmonary consult.   Assessment/Plan: NSTEMI ASA/plavix/statin.  Refuses cath.   Cough x2 months productive of white sputum.  This is patients main complaint.  CT chest shows right sided ground glass opacities: asymmetric edema v.  infectious pneumonia (such as viral), chronic interstitial pneumonitis, sarcoidosis, or possibly lymphangitic spread of tumor. Repeat BNP was 340, down from 921. Weight has decreased to 266 from 280 with diuresis, but was in the 255-260 range 2 months ago. Discussed with pulmonary and cardiology. continue diuresis a bit more to dry weight while monitoring basic metabolic panels and get a high resolution CT chest as an inpatient.  Will give IV Lasix 80 mg twice a day today and monitor BMET. Has received 5 days of levofloxacin. Completed course.  Uncontrolled DM with intermittent hypoglycemia cbgs have been labile but starting to increase again. Continue sliding scale. Increase glipizide HgbA1C: 8  AKI Improved.  UTI? Urine culture with multiple morphotypes.   Levaquin course completed blood cultures negative  Acute on chronic systolic and diastolic heart failure, improving. 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 She reports she has an  appointment with Dr. Estanislado Pandy next month. Per outpatient records has a history of osteoarthritis. Patient reports pain slightly improved on scheduled Tylenol. Rheumatoid factor borderline high, ANA negative, ESR 70, nonspecific. Physical therapy without specific recommendations  Code Status: full Family Communication: patient Disposition Plan: Home once diuresed down to dry weight and after high resolution CT chest- hope for Thursday   Consultants:  cards  Procedures:    HPI/Subjective: Feeling better and better everyday  Objective: Filed Vitals:   06/08/15 0425  BP: 133/79  Pulse: 83  Temp: 97.7 F (36.5 C)  Resp: 18    Intake/Output Summary (Last 24 hours) at 06/08/15 1224 Last data filed at 06/08/15 0845  Gross per 24 hour  Intake    900 ml  Output   3500 ml  Net  -2600 ml   Filed Weights   06/06/15 0621 06/07/15 0549 06/08/15 0425  Weight: 121 kg (266 lb 12.1 oz) 120.611 kg (265 lb 14.4 oz) 121.02 kg (266 lb 12.8 oz)    Exam:   General:  in chair. Alert oriented.   Cardiovascular: rrr without MGR  Respiratory: diminished, without wheezes rhonchi or rales   Abdomen: +Bs, soft  Musculoskeletal:  Min edema  Data Reviewed: Basic Metabolic Panel:  Recent Labs Lab 06/03/15 0418 06/04/15 0546 06/06/15 0446 06/07/15 0500 06/08/15 0414  NA 133* 133* 134* 137 138  K 4.3 4.2 4.0 4.1 4.0  CL 92* 95* 98* 103 103  CO2 32 _0 GLUCOSE 77 163* 210* 258* 189*  BUN 35* 24* 22* 25* 21*  CREATININE 1.16* 1.01* 1.06* 0.95 0.88  CALCIUM 9.0 9.2 9.4 9.3 9.3   Liver Function Tests:  Recent Labs Lab  06/02/15 0349  AST 56*  ALT 56*  ALKPHOS 141*  BILITOT 1.0  PROT 7.7  ALBUMIN 2.8*   No results for input(s): LIPASE, AMYLASE in the last 168 hours. No results for input(s): AMMONIA in the last 168 hours. CBC:  Recent Labs Lab 06/02/15 0349  WBC 5.7  HGB 12.8  HCT 38.0  MCV 88.4  PLT 247   Cardiac Enzymes: No results for input(s):  CKTOTAL, CKMB, CKMBINDEX, TROPONINI in the last 168 hours. BNP (last 3 results)  Recent Labs  03/13/15 1135 05/29/15 1608 06/04/15 0945  BNP 750.3* 921.7* 340.1*    ProBNP (last 3 results) No results for input(s): PROBNP in the last 8760 hours.  CBG:  Recent Labs Lab 06/07/15 1107 06/07/15 1613 06/07/15 2055 06/08/15 0730 06/08/15 1104  GLUCAP 309* 199* 165* 246* 336*    Recent Results (from the past 240 hour(s))  Culture, Urine     Status: None   Collection Time: 05/29/15  2:04 PM  Result Value Ref Range Status   Specimen Description URINE, CLEAN CATCH  Final   Special Requests NONE  Final   Culture   Final    MULTIPLE SPECIES PRESENT, SUGGEST RECOLLECTION IF CLINICALLY INDICATED   Report Status 05/31/2015 FINAL  Final  MRSA PCR Screening     Status: None   Collection Time: 05/29/15  7:20 PM  Result Value Ref Range Status   MRSA by PCR NEGATIVE NEGATIVE Final    Comment:        The GeneXpert MRSA Assay (FDA approved for NASAL specimens only), is one component of a comprehensive MRSA colonization surveillance program. It is not intended to diagnose MRSA infection nor to guide or monitor treatment for MRSA infections.   Culture, blood (routine x 2)     Status: None   Collection Time: 05/30/15  8:50 AM  Result Value Ref Range Status   Specimen Description BLOOD LEFT HAND  Final   Special Requests BOTTLES DRAWN AEROBIC ONLY  3CC  Final   Culture NO GROWTH 5 DAYS  Final   Report Status 06/04/2015 FINAL  Final     Studies: Ct Chest Wo Contrast  06/03/2015   CLINICAL DATA:  Shortness of breath and worsening cough for 2 months. History of ischemic cardiomyopathy and heart failure.  EXAM: CT CHEST WITHOUT CONTRAST  TECHNIQUE: Multidetector CT imaging of the chest was performed following the standard protocol without IV contrast.  COMPARISON:  Chest radiograph 06/01/2015  FINDINGS: No enlarged axillary or hilar lymph nodes are identified. Borderline enlarged  mediastinal lymph nodes measure up to 9 mm in short axis. The heart is mildly enlarged. Sequelae of prior CABG are identified, and there are 3 vessel coronary artery calcifications. There is no pleural or pericardial effusion. A small amount of mucus/debris is present at the left mainstem bronchus orifice.  There are rather extensive areas of ground-glass opacity and interlobular septal thickening throughout the right lung, located greater peripherally than centrally and with evidence of some nodularity. The left lung is largely spared, although there are some mild areas of peripheral reticulation, predominantly in the left upper lobe, and minimal subpleural ground-glass opacity. There is a 3.5 cm confluent opacity in the posterior left lower lobe which extends to the pleural surface with evidence of volume loss in the left lower lobe.  The visualized portion of the upper abdomen is unremarkable. Disc degeneration and vacuum disc phenomenon is seen at multiple levels in the lower thoracic and visualized upper lumbar spine.  IMPRESSION: 1. Ground-glass opacities and interlobular septal thickening with some nodularity, predominantly involving the right lung and nonspecific. There may be a component of asymmetric pulmonary edema, however additional considerations include infectious pneumonia (such as viral), chronic interstitial pneumonitis, sarcoidosis, or possibly lymphangitic spread of tumor. Further evaluation with high-resolution chest CT as an outpatient is suggested. 2. Left lower lobe round atelectasis.   Electronically Signed   By: Logan Bores   On: 06/03/2015 15:35    Scheduled Meds: . acetaminophen  650 mg Oral TID  . albuterol  2.5 mg Nebulization BID  . aspirin  81 mg Oral Daily  . atorvastatin  80 mg Oral q1800  . benzonatate  100 mg Oral TID  . clopidogrel  75 mg Oral Daily  . dextromethorphan-guaiFENesin  1 tablet Oral BID  . furosemide  80 mg Intravenous BID  . glipiZIDE  10 mg Oral QAC  breakfast  . heparin  5,000 Units Subcutaneous 3 times per day  . insulin aspart  0-15 Units Subcutaneous TID WC  . insulin aspart  0-5 Units Subcutaneous QHS  . insulin aspart  5 Units Subcutaneous TID WC  . sodium chloride  3 mL Intravenous Q12H  . sodium chloride  3 mL Intravenous Q12H   Continuous Infusions:  Antibiotics Given (last 72 hours)    None      Principal Problem:   Acute on chronic systolic CHF (congestive heart failure) Active Problems:   Obesity, morbid-BMI 45   Hyperlipidemia LDL goal <70   Type 2 diabetes mellitus, uncontrolled   Hx of CABG 2012   Ischemic cardiomyopathy-30-35% May 2016   Pulmonary hypertension   Hypothyroidism   Chronic systolic heart failure   Spironolactone adverse reaction   Painful diabetic neuropathy   DJD (degenerative joint disease), multiple sites   Cough   NSTEMI- declined cath   Hypoglycemia on adm 7/18   Hyponatremia   Pressure ulcer   Renal insufficiency  Time spent: 25 min  VANN, JESSICA  [facility] [email or web address], password [facility] 06/08/2015, 12:24 PM  LOS: 10 days

## 2015-06-08 NOTE — Progress Notes (Signed)
Patient did ambulate around unit this evening.

## 2015-06-09 ENCOUNTER — Inpatient Hospital Stay (HOSPITAL_COMMUNITY): Payer: BLUE CROSS/BLUE SHIELD

## 2015-06-09 ENCOUNTER — Telehealth: Payer: Self-pay | Admitting: *Deleted

## 2015-06-09 LAB — BASIC METABOLIC PANEL
Anion gap: 7 (ref 5–15)
BUN: 25 mg/dL — ABNORMAL HIGH (ref 6–20)
CHLORIDE: 101 mmol/L (ref 101–111)
CO2: 28 mmol/L (ref 22–32)
Calcium: 9.3 mg/dL (ref 8.9–10.3)
Creatinine, Ser: 0.93 mg/dL (ref 0.44–1.00)
GFR calc Af Amer: >60 mL/min (ref >60)
GFR calc non Af Amer: >60 mL/min (ref >60)
Glucose, Bld: 208 mg/dL — ABNORMAL HIGH (ref 65–99)
POTASSIUM: 4 mmol/L (ref 3.5–5.1)
Sodium: 136 mmol/L (ref 135–145)

## 2015-06-09 LAB — GLUCOSE, CAPILLARY
GLUCOSE-CAPILLARY: 242 mg/dL — AB (ref 65–99)
Glucose-Capillary: 246 mg/dL — ABNORMAL HIGH (ref 65–99)
Glucose-Capillary: 305 mg/dL — ABNORMAL HIGH (ref 65–99)
Glucose-Capillary: 136 mg/dL — ABNORMAL HIGH (ref 65–99)

## 2015-06-09 MED ORDER — FUROSEMIDE 80 MG PO TABS
80.0000 mg | ORAL_TABLET | Freq: Two times a day (BID) | ORAL | Status: DC
  Administered 2015-06-09 – 2015-06-10 (×3): 80 mg via ORAL
  Filled 2015-06-09 (×3): qty 1

## 2015-06-09 NOTE — Telephone Encounter (Signed)
Pt being dc'd on Sat 7/30 & has a TOC appt on Friday 8/5 :30am with Lawson Fiscal. Please have someone call pt Monday or Tuesday

## 2015-06-09 NOTE — Progress Notes (Signed)
Inpatient Diabetes Program Recommendations  AACE/ADA: New Consensus Statement on Inpatient Glycemic Control (2013)  Target Ranges:  Prepandial:   less than 140 mg/dL      Peak postprandial:   less than 180 mg/dL (1-2 hours)      Critically ill patients:  140 - 180 mg/dL   Recommend adding basal Lantus or Levemir 10 units to start.  Thank you  Piedad Climes BSN, RN,CDE Inpatient Diabetes Coordinator [phone] (team pager)

## 2015-06-09 NOTE — Progress Notes (Signed)
PROGRESS NOTE  Christine Knight LRJ:[id number] DOB: Jan 01, 1951 DOA: 05/29/2015 PCP: Scarlette Calico, MD  Christine Knight is a 64 y.o.female. who presented the emergency room  with altered mental status. Her glucose was 40. This was treated in her mental status improved. However her chest x-ray showed pulmonary edema and her troponin was elevated. She has known coronary disease. She has significant left ventricular dysfunction.  Recently she was hospitalized with CHF. Right and left heart cath was recommended, but patient refuses.  Chronic cough x2 months has been worked up with CT chest and pulmonary consult.   Assessment/Plan: NSTEMI ASA/plavix/statin.  Refuses cath.   Cough x2 months productive of white sputum.  This is patients main complaint.  CT chest shows right sided ground glass opacities: asymmetric edema v.  infectious pneumonia (such as viral), chronic interstitial pneumonitis, sarcoidosis, or possibly lymphangitic spread of tumor. Repeat BNP was 340, down from 921. Weight has decreased to 266 from 280 with diuresis, but was in the 255-260 range 2 months ago. Discussed with pulmonary and cardiology. continue diuresis a bit more to dry weight while monitoring basic metabolic panels and get a high resolution CT chest as an inpatient.   -change lasix to PO 7/29- observe 1 more day  Uncontrolled DM with intermittent hypoglycemia cbgs have been labile but starting to increase again. Continue sliding scale. Increase glipizide HgbA1C: 8  AKI Improved.  UTI? Urine culture with multiple morphotypes.   Levaquin course completed blood cultures negative  Acute on chronic systolic and diastolic heart failure, improving. 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 She reports she has an appointment with Dr. Estanislado Pandy next month. Per outpatient records has a  history of osteoarthritis. Patient reports pain slightly improved on scheduled Tylenol. Rheumatoid factor borderline high, ANA negative, ESR 70, nonspecific. Physical therapy without specific recommendations  Code Status: full Family Communication: patient Disposition Plan: Home in AM??   Consultants:  cards  Procedures:    HPI/Subjective: No overnight events  Objective: Filed Vitals:   06/09/15 0500  BP: 111/67  Pulse: 76  Temp: 97.9 F (36.6 C)  Resp: 18    Intake/Output Summary (Last 24 hours) at 06/09/15 1211 Last data filed at 06/09/15 0911  Gross per 24 hour  Intake   1560 ml  Output   1850 ml  Net   -290 ml   Filed Weights   06/06/15 0621 06/07/15 0549 06/08/15 0425  Weight: 121 kg (266 lb 12.1 oz) 120.611 kg (265 lb 14.4 oz) 121.02 kg (266 lb 12.8 oz)    Exam:   General:  in chair. Alert oriented.   Cardiovascular: rrr without MGR  Respiratory: diminished, without wheezes rhonchi or rales   Abdomen: +Bs, soft  Musculoskeletal:  Min edema  Data Reviewed: Basic Metabolic Panel:  Recent Labs Lab 06/04/15 0546 06/06/15 0446 06/07/15 0500 06/08/15 0414 06/09/15 0409  NA 133* 134* 137 138 136  K 4.2 4.0 4.1 4.0 4.0  CL 95* 98* 103 103 101  CO2 _0 GLUCOSE 163* 210* 258* 189* 208*  BUN 24* 22* 25* 21* 25*  CREATININE 1.01* 1.06* 0.95 0.88 0.93  CALCIUM 9.2 9.4 9.3 9.3 9.3   Liver Function Tests: No results for input(s): AST, ALT, ALKPHOS, BILITOT, PROT, ALBUMIN in the last 168 hours. No results for input(s): LIPASE, AMYLASE in the last 168 hours. No results for input(s):  AMMONIA in the last 168 hours. CBC: No results for input(s): WBC, NEUTROABS, HGB, HCT, MCV, PLT in the last 168 hours. Cardiac Enzymes: No results for input(s): CKTOTAL, CKMB, CKMBINDEX, TROPONINI in the last 168 hours. BNP (last 3 results)  Recent Labs  03/13/15 1135 05/29/15 1608 06/04/15 0945  BNP 750.3* 921.7* 340.1*    ProBNP (last 3  results) No results for input(s): PROBNP in the last 8760 hours.  CBG:  Recent Labs Lab 06/08/15 1104 06/08/15 1618 06/08/15 2014 06/09/15 0717 06/09/15 1110  GLUCAP 336* 122* 94 246* 305*    No results found for this or any previous visit (from the past 240 hour(s)).   Studies: Ct Chest Wo Contrast  06/03/2015   CLINICAL DATA:  Shortness of breath and worsening cough for 2 months. History of ischemic cardiomyopathy and heart failure.  EXAM: CT CHEST WITHOUT CONTRAST  TECHNIQUE: Multidetector CT imaging of the chest was performed following the standard protocol without IV contrast.  COMPARISON:  Chest radiograph 06/01/2015  FINDINGS: No enlarged axillary or hilar lymph nodes are identified. Borderline enlarged mediastinal lymph nodes measure up to 9 mm in short axis. The heart is mildly enlarged. Sequelae of prior CABG are identified, and there are 3 vessel coronary artery calcifications. There is no pleural or pericardial effusion. A small amount of mucus/debris is present at the left mainstem bronchus orifice.  There are rather extensive areas of ground-glass opacity and interlobular septal thickening throughout the right lung, located greater peripherally than centrally and with evidence of some nodularity. The left lung is largely spared, although there are some mild areas of peripheral reticulation, predominantly in the left upper lobe, and minimal subpleural ground-glass opacity. There is a 3.5 cm confluent opacity in the posterior left lower lobe which extends to the pleural surface with evidence of volume loss in the left lower lobe.  The visualized portion of the upper abdomen is unremarkable. Disc degeneration and vacuum disc phenomenon is seen at multiple levels in the lower thoracic and visualized upper lumbar spine.  IMPRESSION: 1. Ground-glass opacities and interlobular septal thickening with some nodularity, predominantly involving the right lung and nonspecific. There may be a  component of asymmetric pulmonary edema, however additional considerations include infectious pneumonia (such as viral), chronic interstitial pneumonitis, sarcoidosis, or possibly lymphangitic spread of tumor. Further evaluation with high-resolution chest CT as an outpatient is suggested. 2. Left lower lobe round atelectasis.   Electronically Signed   By: Logan Bores   On: 06/03/2015 15:35    Scheduled Meds: . acetaminophen  650 mg Oral TID  . aspirin  81 mg Oral Daily  . atorvastatin  80 mg Oral q1800  . benzonatate  100 mg Oral TID  . clopidogrel  75 mg Oral Daily  . dextromethorphan-guaiFENesin  1 tablet Oral BID  . furosemide  80 mg Oral BID  . glipiZIDE  10 mg Oral QAC breakfast  . heparin  5,000 Units Subcutaneous 3 times per day  . insulin aspart  0-15 Units Subcutaneous TID WC  . insulin aspart  0-5 Units Subcutaneous QHS  . insulin aspart  5 Units Subcutaneous TID WC  . sodium chloride  3 mL Intravenous Q12H  . sodium chloride  3 mL Intravenous Q12H   Continuous Infusions:  Antibiotics Given (last 72 hours)    None      Principal Problem:   Acute on chronic systolic CHF (congestive heart failure) Active Problems:   Obesity, morbid-BMI 45   Hyperlipidemia LDL goal <70  Type 2 diabetes mellitus, uncontrolled   Hx of CABG 2012   Ischemic cardiomyopathy-30-35% May 2016   Pulmonary hypertension   Hypothyroidism   Chronic systolic heart failure   Spironolactone adverse reaction   Painful diabetic neuropathy   DJD (degenerative joint disease), multiple sites   Cough   NSTEMI- declined cath   Hypoglycemia on adm 7/18   Hyponatremia   Pressure ulcer   Renal insufficiency  Time spent: 25 min  Byrdie Miyazaki  [facility] [email or web address], password [facility] 06/09/2015, 12:11 PM  LOS: 11 days

## 2015-06-09 NOTE — Progress Notes (Addendum)
SUBJECTIVE:  The recorded input and output are equal. BUN is 25 and creatinine 0.93. I suspect the patient is now a dry weight. Would proceed with high-resolution CT scan of the chest as previously planned. This can be done today.   Filed Vitals:   06/08/15 1653 06/08/15 2003 06/08/15 2047 06/09/15 0500  BP: 103/65 104/69  111/67  Pulse:  75  76  Temp:  98.4 F (36.9 C)  97.9 F (36.6 C)  TempSrc:  Oral  Oral  Resp:  18  18  Height:      Weight:      SpO2:  98% 98% 97%     Intake/Output Summary (Last 24 hours) at 06/09/15 0736 Last data filed at 06/09/15 0600  Gross per 24 hour  Intake   1920 ml  Output   1850 ml  Net     70 ml    LABS: Basic Metabolic Panel:  Recent Labs  16/10/96 0414 06/09/15 0409  NA 138 136  K 4.0 4.0  CL 103 101  CO2 26 28  GLUCOSE 189* 208*  BUN 21* 25*  CREATININE 0.88 0.93  CALCIUM 9.3 9.3   Liver Function Tests: No results for input(s): AST, ALT, ALKPHOS, BILITOT, PROT, ALBUMIN in the last 72 hours. No results for input(s): LIPASE, AMYLASE in the last 72 hours. CBC: No results for input(s): WBC, NEUTROABS, HGB, HCT, MCV, PLT in the last 72 hours. Cardiac Enzymes: No results for input(s): CKTOTAL, CKMB, CKMBINDEX, TROPONINI in the last 72 hours. BNP: Invalid input(s): POCBNP D-Dimer: No results for input(s): DDIMER in the last 72 hours. Hemoglobin A1C: No results for input(s): HGBA1C in the last 72 hours. Fasting Lipid Panel: No results for input(s): CHOL, HDL, LDLCALC, TRIG, CHOLHDL, LDLDIRECT in the last 72 hours. Thyroid Function Tests: No results for input(s): TSH, T4TOTAL, T3FREE, THYROIDAB in the last 72 hours.  Invalid input(s): FREET3  RADIOLOGY: Dg Chest 2 View  05/29/2015   CLINICAL DATA:  Cough, congestion, wheezing and fever today.  EXAM: CHEST  2 VIEW  COMPARISON:  May 16, 2015  FINDINGS: The heart size and mediastinal contours are stable. The heart size is enlarged. Patient status post prior CABG and median  sternotomy. There is pulmonary edema. There is no focal pneumonia. There is no pleural effusion. The visualized skeletal structures are stable.  IMPRESSION: Congestive heart failure.   Electronically Signed   By: Sherian Rein M.D.   On: 05/29/2015 14:50   Dg Chest 2 View  05/16/2015   CLINICAL DATA:  64 year old female with cough and congestion, shortness of breath and low grade fever x2 months  EXAM: CHEST  2 VIEW  COMPARISON:  Chest radiograph dated 03/13/2015  FINDINGS: Two views of the chest again demonstrates mild central vascular prominence similar to prior study. There is no focal consolidation. There is minimal blunting of the left costophrenic angle. No pneumothorax. Median sternotomy wires and CABG clips noted. Stable mild cardiomegaly. The osseous structures appear unremarkable.  IMPRESSION: No significant interval change. Stable central vascular prominence. No focal consolidation.   Electronically Signed   By: Elgie Collard M.D.   On: 05/16/2015 16:53   Ct Head Wo Contrast  05/29/2015   CLINICAL DATA:  Generalized weakness for 1 week, slurred speech.  EXAM: CT HEAD WITHOUT CONTRAST  TECHNIQUE: Contiguous axial images were obtained from the base of the skull through the vertex without intravenous contrast.  COMPARISON:  MRI 12/19/2013  FINDINGS: No acute intracranial abnormality. Specifically, no  hemorrhage, hydrocephalus, mass lesion, acute infarction, or significant intracranial injury. No acute calvarial abnormality. Visualized paranasal sinuses and mastoids clear. Orbital soft tissues unremarkable.  IMPRESSION: Negative.   Electronically Signed   By: Charlett Nose M.D.   On: 05/29/2015 12:14   Ct Chest Wo Contrast  06/03/2015   CLINICAL DATA:  Shortness of breath and worsening cough for 2 months. History of ischemic cardiomyopathy and heart failure.  EXAM: CT CHEST WITHOUT CONTRAST  TECHNIQUE: Multidetector CT imaging of the chest was performed following the standard protocol without IV  contrast.  COMPARISON:  Chest radiograph 06/01/2015  FINDINGS: No enlarged axillary or hilar lymph nodes are identified. Borderline enlarged mediastinal lymph nodes measure up to 9 mm in short axis. The heart is mildly enlarged. Sequelae of prior CABG are identified, and there are 3 vessel coronary artery calcifications. There is no pleural or pericardial effusion. A small amount of mucus/debris is present at the left mainstem bronchus orifice.  There are rather extensive areas of ground-glass opacity and interlobular septal thickening throughout the right lung, located greater peripherally than centrally and with evidence of some nodularity. The left lung is largely spared, although there are some mild areas of peripheral reticulation, predominantly in the left upper lobe, and minimal subpleural ground-glass opacity. There is a 3.5 cm confluent opacity in the posterior left lower lobe which extends to the pleural surface with evidence of volume loss in the left lower lobe.  The visualized portion of the upper abdomen is unremarkable. Disc degeneration and vacuum disc phenomenon is seen at multiple levels in the lower thoracic and visualized upper lumbar spine.  IMPRESSION: 1. Ground-glass opacities and interlobular septal thickening with some nodularity, predominantly involving the right lung and nonspecific. There may be a component of asymmetric pulmonary edema, however additional considerations include infectious pneumonia (such as viral), chronic interstitial pneumonitis, sarcoidosis, or possibly lymphangitic spread of tumor. Further evaluation with high-resolution chest CT as an outpatient is suggested. 2. Left lower lobe round atelectasis.   Electronically Signed   By: Sebastian Ache   On: 06/03/2015 15:35   Dg Chest Port 1 View  06/01/2015   CLINICAL DATA:  Cough congestion, and wheezing since May of 2016  EXAM: PORTABLE CHEST - 1 VIEW  COMPARISON:  Chest x-ray of May 29, 2015 and May 16, 2015.  FINDINGS:  The lungs are adequately inflated. There remain increased interstitial densities bilaterally. These have slightly improved since the previous study. Pulmonary vascular congestion is also slightly decreased. The cardiac silhouette remains enlarged but its margins are more distinct. The patient has undergone previous CABG. There are 8 intact sternal wires. The mediastinum is normal in width. The bony thorax exhibits no acute abnormality.  IMPRESSION: Slight interval improvement in pulmonary interstitial edema secondary to CHF. There is no focal pneumonia.   Electronically Signed   By: David  [place] M.D.   On: 06/01/2015 08:22    PHYSICAL EXAM the patient is oriented to person time and place. Affect is normal. Lungs are clear. Respiratory effort is nonlabored. Cardiac exam reveals an S1 and S2. Abdomen is soft. There is no significant peripheral edema.   TELEMETRY: I have reviewed telemetry today June 09, 2015. There is normal sinus rhythm.   ASSESSMENT AND PLAN:     Acute on chronic systolic CHF (congestive heart failure)      I think she is now at dry weight. We will work are to try to keep her there at home. Today she needs her high-resolution  CT scan of the chest.  I have not ordered this. I will leave this up to the primary team. I have had a long discussion with the patient reminding her once again to limit salt and fluid intake. I have instructed her to weigh herself when she gets home wearing the close that she would normally be wearing when she gets up in the morning. She is then to continue to weigh herself each morning and record her values. We will arrange for early post hospital CHF follow-up. I have instructed her to bring the recording of her weights to that visit. We have used IV diuretics to diuresis her. I will try to see if we can keep her stable on 80 of Lasix by mouth twice a day at home. We will know when she seen back in the office if she needs higher dosing or other meds that work  synergistically with Lasix.    Active Problems:   Obesity, morbid-BMI 45   Hyperlipidemia LDL goal <70   Type 2 diabetes mellitus, uncontrolled   Hx of CABG 2012   Ischemic cardiomyopathy-30-35% May 2016   Pulmonary hypertension   Hypothyroidism   Chronic systolic heart failure   Spironolactone adverse reaction   Painful diabetic neuropathy   DJD (degenerative joint disease), multiple sites   Cough   NSTEMI- declined cath   Hypoglycemia on adm 7/18   Hyponatremia   Pressure ulcer   Renal insufficiency   Willa Rough 06/09/2015 7:36 AM

## 2015-06-10 LAB — GLUCOSE, CAPILLARY: Glucose-Capillary: 198 mg/dL — ABNORMAL HIGH (ref 65–99)

## 2015-06-10 MED ORDER — BENZONATATE 100 MG PO CAPS: 100.0000 mg | ORAL_CAPSULE | Freq: Three times a day (TID) | ORAL | Status: DC

## 2015-06-10 MED ORDER — CLOPIDOGREL BISULFATE 75 MG PO TABS: 75.0000 mg | ORAL_TABLET | Freq: Every day | ORAL | Status: DC

## 2015-06-10 MED ORDER — BLOOD GLUCOSE MONITOR KIT: PACK | Status: DC

## 2015-06-10 MED ORDER — FUROSEMIDE 80 MG PO TABS: 80.0000 mg | ORAL_TABLET | Freq: Two times a day (BID) | ORAL | Status: DC

## 2015-06-10 MED ORDER — ATORVASTATIN CALCIUM 80 MG PO TABS: 80.0000 mg | ORAL_TABLET | Freq: Every day | ORAL | Status: DC

## 2015-06-10 NOTE — Progress Notes (Signed)
discharge order received.  Discharge papers reviewed with patient.  Patient given heart failure book and education on heart failure management.  IV removed.  Patient awaiting daughter for ride  Home.  Patient in no active distress.

## 2015-06-10 NOTE — Progress Notes (Signed)
Spoke with Arielle Antenucci in CT.  She stated that patient did not receive contrast.  RN educated patient that she can resume Janumet tonight

## 2015-06-10 NOTE — Discharge Summary (Signed)
Physician Discharge Summary  Christine Knight OEH:[id number] DOB: [birth year] DOA: 05/29/2015  PCP: Scarlette Calico, MD  Admit date: 05/29/2015 Discharge date: 06/10/2015  Time spent: 35 minutes  Recommendations for Outpatient Follow-up:  1. High resolution CT scan results pending- needs follow up with Dr. Annamaria Boots. 2. Follow up with cardiology 8/5  Discharge Diagnoses:  Principal Problem:   Acute on chronic systolic CHF (congestive heart failure) Active Problems:   Obesity, morbid-BMI 45   Hyperlipidemia LDL goal <70   Type 2 diabetes mellitus, uncontrolled   Hx of CABG 2012   Ischemic cardiomyopathy-30-35% May 2016   Pulmonary hypertension   Hypothyroidism   Chronic systolic heart failure   Spironolactone adverse reaction   Painful diabetic neuropathy   DJD (degenerative joint disease), multiple sites   Cough   NSTEMI- declined cath   Hypoglycemia on adm 7/18   Hyponatremia   Pressure ulcer   Renal insufficiency   Discharge Condition: improved  Diet recommendation: carb/cardiac  Filed Weights   06/06/15 0621 06/07/15 0549 06/08/15 0425  Weight: 121 kg (266 lb 12.1 oz) 120.611 kg (265 lb 14.4 oz) 121.02 kg (266 lb 12.8 oz)    History of present illness:  Christine Knight is a 64 y.o. female  With PMhx of obesity, DM, CAD. She was hospitalized in May with acute on chronic systolic and diastolic HF. She recently followed up with her PCP for a 2 month cough- placed on azithromax and ACE was stopped. She presents to the ER today with slurred speech x 2 days and weakness. When EMS arrived, her blood sugars were low. Symptoms of slurred speech resolved with the blood sugar treatment. +SOB with exertion, no fever, no chills, + cough. Decreased appetite. No dysuria  In the ER, she was found to be hypoglycemia- this was corrected. She also was found to have en elevated troponin. Her Cr was worsened from her baseline. Her urine had LE. Her x ray showed pulm edema. She was  initially given fluid in ER but changed to lasix when x ray back Cath briefly discussed with patient by cardiology at bedside but so far patient is resistant.   Hospitalist were called to assist with admission.   Hospital Course:  NSTEMI ASA/plavix/statin. Refuses cath.   Cough x2 months productive of white sputum. This is patients main complaint. CT chest shows right sided ground glass opacities: asymmetric edema v. infectious pneumonia (such as viral), chronic interstitial pneumonitis, sarcoidosis, or possibly lymphangitic spread of tumor. Repeat BNP was 340, down from 921. Weight has decreased to 266 from 280 with diuresis, but was in the 255-260 range 2 months ago.  CT scan done but not read- will defer to PCP- pulm follow up as outaptient -change lasix to PO 7/29  Uncontrolled DM with intermittent hypoglycemia -may need injectable insulin in future but will try to maximize oral meds and encourage weight loss and exercise  AKI Improved.  UTI? Urine culture with multiple morphotypes.  Levaquin course completed blood cultures negative  Acute on chronic systolic and diastolic heart failure, improving. 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 She reports she has an appointment with Dr. Estanislado Pandy next month. Per outpatient records has a history of osteoarthritis. Patient reports pain slightly improved on scheduled Tylenol. Rheumatoid factor borderline high, ANA negative, ESR 70, nonspecific. Physical therapy without specific recommendations  Procedures:    Consultations:  Cards  pulm  Discharge Exam: Filed Vitals:   06/10/15 0500  BP: 117/66  Pulse: 78  Temp: 98.6 F (37 C)  Resp: 18    General: A+Ox3, NAD- much improved from admission No wheezing  Discharge Instructions   Discharge Instructions    (HEART FAILURE  PATIENTS) Call MD:  Anytime you have any of the following symptoms: 1) 3 pound weight gain in 24 hours or 5 pounds in 1 week 2) shortness of breath, with or without a dry hacking cough 3) swelling in the hands, feet or stomach 4) if you have to sleep on extra pillows at night in order to breathe.    Complete by:  As directed      Diet - low sodium heart healthy    Complete by:  As directed      Diet Carb Modified    Complete by:  As directed      Discharge instructions    Complete by:  As directed   Weigh when you get home wearing the clothing that you would normally be wearing when you get up in the morning.  -continue to weigh each morning and record values and bring to appointment     Increase activity slowly    Complete by:  As directed           Current Discharge Medication List    START taking these medications   Details  benzonatate (TESSALON) 100 MG capsule Take 1 capsule (100 mg total) by mouth 3 (three) times daily. Qty: 40 capsule, Refills: 1    !! blood glucose meter kit and supplies KIT Dispense based on patient and insurance preference. Use up to four times daily as directed. (FOR ICD-9 250.00, 250.01). Qty: 1 each, Refills: 0    clopidogrel (PLAVIX) 75 MG tablet Take 1 tablet (75 mg total) by mouth daily. Qty: 30 tablet, Refills: 0    furosemide (LASIX) 80 MG tablet Take 1 tablet (80 mg total) by mouth 2 (two) times daily. Qty: 60 tablet, Refills: 0     !! - Potential duplicate medications found. Please discuss with provider.    CONTINUE these medications which have CHANGED   Details  atorvastatin (LIPITOR) 80 MG tablet Take 1 tablet (80 mg total) by mouth daily at 6 PM. Qty: 30 tablet, Refills: 0      CONTINUE these medications which have NOT CHANGED   Details  acetaminophen (TYLENOL) 325 MG tablet Take 650 mg by mouth every 6 (six) hours as needed for mild pain.    aspirin 81 MG tablet Take 1 tablet (81 mg total) by mouth daily. Qty: 30 tablet, Refills: 9     !! Blood Glucose Monitoring Suppl (ONETOUCH VERIO) W/DEVICE KIT 1 Act by Does not apply route 3 (three) times daily. Qty: 2 kit, Refills: 0   Associated Diagnoses: Diabetes mellitus type 2 in obese    glipiZIDE (GLUCOTROL) 10 MG tablet Take 10 mg by mouth 2 (two) times daily. Refills: 1    glucose blood test strip Use TID Qty: 100 each, Refills: 12   Associated Diagnoses: Diabetes mellitus type 2 in obese    sitaGLIPtin-metformin (JANUMET) 50-1000 MG per tablet Take 1 tablet by mouth 2 (two) times daily with a meal. Qty: 180 tablet, Refills: 1   Associated Diagnoses: Type 2 diabetes mellitus, uncontrolled    Vitamin D, Ergocalciferol, (DRISDOL) 50000 UNITS CAPS capsule Take 50,000 Units by mouth 2 (two) times a week.  Refills: 0     !! - Potential duplicate  medications found. Please discuss with provider.    STOP taking these medications     torsemide (DEMADEX) 20 MG tablet      promethazine-dextromethorphan (PROMETHAZINE-DM) 6.25-15 MG/5ML syrup        Allergies  Allergen Reactions  . Lisinopril Cough    cough   Follow-up Information    Follow up with Truitt Merle, NP On 06/16/2015.   Specialties:  Nurse Practitioner, Interventional Cardiology, Cardiology, Radiology   Why:  8:30am   Contact information:   [place] [place] [address] [phone]       Follow up with Scarlette Calico, MD In 1 week.   Specialty:  Internal Medicine   Why:  results of high resolution CT scan (pending at d/c)   Contact information:   [address] [address] [phone]       Follow up with Deneise Lever, MD In 1 month.   Specialty:  Pulmonary Disease   Contact information:   [place] [address] [phone]        The results of significant diagnostics from this hospitalization (including imaging, microbiology, ancillary and laboratory) are listed below for reference.    Significant Diagnostic Studies: Dg  Chest 2 View  05/29/2015   CLINICAL DATA:  Cough, congestion, wheezing and fever today.  EXAM: CHEST  2 VIEW  COMPARISON:  May 16, 2015  FINDINGS: The heart size and mediastinal contours are stable. The heart size is enlarged. Patient status post prior CABG and median sternotomy. There is pulmonary edema. There is no focal pneumonia. There is no pleural effusion. The visualized skeletal structures are stable.  IMPRESSION: Congestive heart failure.   Electronically Signed   By: Abelardo Diesel M.D.   On: 05/29/2015 14:50   Dg Chest 2 View  05/16/2015   CLINICAL DATA:  64 year old female with cough and congestion, shortness of breath and low grade fever x2 months  EXAM: CHEST  2 VIEW  COMPARISON:  Chest radiograph dated 03/13/2015  FINDINGS: Two views of the chest again demonstrates mild central vascular prominence similar to prior study. There is no focal consolidation. There is minimal blunting of the left costophrenic angle. No pneumothorax. Median sternotomy wires and CABG clips noted. Stable mild cardiomegaly. The osseous structures appear unremarkable.  IMPRESSION: No significant interval change. Stable central vascular prominence. No focal consolidation.   Electronically Signed   By: Anner Crete M.D.   On: 05/16/2015 16:53   Ct Head Wo Contrast  05/29/2015   CLINICAL DATA:  Generalized weakness for 1 week, slurred speech.  EXAM: CT HEAD WITHOUT CONTRAST  TECHNIQUE: Contiguous axial images were obtained from the base of the skull through the vertex without intravenous contrast.  COMPARISON:  MRI 12/19/2013  FINDINGS: No acute intracranial abnormality. Specifically, no hemorrhage, hydrocephalus, mass lesion, acute infarction, or significant intracranial injury. No acute calvarial abnormality. Visualized paranasal sinuses and mastoids clear. Orbital soft tissues unremarkable.  IMPRESSION: Negative.   Electronically Signed   By: Rolm Baptise M.D.   On: 05/29/2015 12:14   Ct Chest Wo Contrast  06/03/2015    CLINICAL DATA:  Shortness of breath and worsening cough for 2 months. History of ischemic cardiomyopathy and heart failure.  EXAM: CT CHEST WITHOUT CONTRAST  TECHNIQUE: Multidetector CT imaging of the chest was performed following the standard protocol without IV contrast.  COMPARISON:  Chest radiograph 06/01/2015  FINDINGS: No enlarged axillary or hilar lymph nodes are identified. Borderline enlarged mediastinal  lymph nodes measure up to 9 mm in short axis. The heart is mildly enlarged. Sequelae of prior CABG are identified, and there are 3 vessel coronary artery calcifications. There is no pleural or pericardial effusion. A small amount of mucus/debris is present at the left mainstem bronchus orifice.  There are rather extensive areas of ground-glass opacity and interlobular septal thickening throughout the right lung, located greater peripherally than centrally and with evidence of some nodularity. The left lung is largely spared, although there are some mild areas of peripheral reticulation, predominantly in the left upper lobe, and minimal subpleural ground-glass opacity. There is a 3.5 cm confluent opacity in the posterior left lower lobe which extends to the pleural surface with evidence of volume loss in the left lower lobe.  The visualized portion of the upper abdomen is unremarkable. Disc degeneration and vacuum disc phenomenon is seen at multiple levels in the lower thoracic and visualized upper lumbar spine.  IMPRESSION: 1. Ground-glass opacities and interlobular septal thickening with some nodularity, predominantly involving the right lung and nonspecific. There may be a component of asymmetric pulmonary edema, however additional considerations include infectious pneumonia (such as viral), chronic interstitial pneumonitis, sarcoidosis, or possibly lymphangitic spread of tumor. Further evaluation with high-resolution chest CT as an outpatient is suggested. 2. Left lower lobe round atelectasis.    Electronically Signed   By: Logan Bores   On: 06/03/2015 15:35   Dg Chest Port 1 View  06/01/2015   CLINICAL DATA:  Cough congestion, and wheezing since May of 2016  EXAM: PORTABLE CHEST - 1 VIEW  COMPARISON:  Chest x-ray of May 29, 2015 and May 16, 2015.  FINDINGS: The lungs are adequately inflated. There remain increased interstitial densities bilaterally. These have slightly improved since the previous study. Pulmonary vascular congestion is also slightly decreased. The cardiac silhouette remains enlarged but its margins are more distinct. The patient has undergone previous CABG. There are 8 intact sternal wires. The mediastinum is normal in width. The bony thorax exhibits no acute abnormality.  IMPRESSION: Slight interval improvement in pulmonary interstitial edema secondary to CHF. There is no focal pneumonia.   Electronically Signed   By: David  Martinique M.D.   On: 06/01/2015 08:22    Microbiology: No results found for this or any previous visit (from the past 240 hour(s)).   Labs: Basic Metabolic Panel:  Recent Labs Lab 06/04/15 0546 06/06/15 0446 06/07/15 0500 06/08/15 0414 06/09/15 0409  NA 133* 134* 137 138 136  K 4.2 4.0 4.1 4.0 4.0  CL 95* 98* 103 103 101  CO2 _0 GLUCOSE 163* 210* 258* 189* 208*  BUN 24* 22* 25* 21* 25*  CREATININE 1.01* 1.06* 0.95 0.88 0.93  CALCIUM 9.2 9.4 9.3 9.3 9.3   Liver Function Tests: No results for input(s): AST, ALT, ALKPHOS, BILITOT, PROT, ALBUMIN in the last 168 hours. No results for input(s): LIPASE, AMYLASE in the last 168 hours. No results for input(s): AMMONIA in the last 168 hours. CBC: No results for input(s): WBC, NEUTROABS, HGB, HCT, MCV, PLT in the last 168 hours. Cardiac Enzymes: No results for input(s): CKTOTAL, CKMB, CKMBINDEX, TROPONINI in the last 168 hours. BNP: BNP (last 3 results)  Recent Labs  03/13/15 1135 05/29/15 1608 06/04/15 0945  BNP 750.3* 921.7* 340.1*    ProBNP (last 3 results) No results  for input(s): PROBNP in the last 8760 hours.  CBG:  Recent Labs Lab 06/09/15 0717 06/09/15 1110 06/09/15 1633 06/09/15 2159 06/10/15  [place]       Signed:  VANN, JESSICA  [facility] 06/10/2015, 11:28 AM

## 2015-06-10 NOTE — Discharge Instructions (Signed)
Low Blood Sugar °Low blood sugar (hypoglycemia) means that the level of sugar in your blood is lower than it should be. Signs of low blood sugar include: °· Getting sweaty. °· Feeling hungry. °· Feeling dizzy or weak. °· Feeling sleepier than normal. °· Feeling nervous. °· Headaches. °· Having a fast heartbeat. °Low blood sugar can happen fast and can be an emergency. Your doctor can do tests to check your blood sugar level. You can have low blood sugar and not have diabetes. °HOME CARE °· Check your blood sugar as told by your doctor. If it is less than 70 mg/dl or as told by your doctor, take 1 of the following: °¨ 3 to 4 glucose tablets. °¨ ½ cup clear juice. °¨ ½ cup soda pop, not diet. °¨ 1 cup milk. °¨ 5 to 6 hard candies. °· Recheck blood sugar after 15 minutes. Repeat until it is at the right level. °· Eat a snack if it is more than 1 hour until the next meal. °· Only take medicine as told by your doctor. °· Do not skip meals. Eat on time. °· Do not drink alcohol except with meals. °· Check your blood glucose before driving. °· Check your blood glucose before and after exercise. °· Always carry treatment with you, such as glucose pills. °· Always wear a medical alert bracelet if you have diabetes. °GET HELP RIGHT AWAY IF:  °· Your blood glucose goes below 70 mg/dl or as told by your doctor, and you: °¨ Are confused. °¨ Are not able to swallow. °¨ Pass out (faint). °· You cannot treat yourself. You may need someone to help you. °· You have low blood sugar problems often. °· You have problems from your medicines. °· You are not feeling better after 3 to 4 days. °· You have vision changes. °MAKE SURE YOU:  °· Understand these instructions. °· Will watch this condition. °· Will get help right away if you are not doing well or get worse. °Document Released: 01/22/2010 Document Revised: 01/20/2012 Document Reviewed: 01/22/2010 °[employer]® Patient Information ©2015 [employer]. This information is not intended to  replace advice given to you by your health care provider. Make sure you discuss any questions you have with your health care provider. ° °

## 2015-06-12 NOTE — Telephone Encounter (Signed)
TCM - LMTCB 

## 2015-06-12 NOTE — Telephone Encounter (Signed)
LMTCB

## 2015-06-16 ENCOUNTER — Encounter: Payer: BLUE CROSS/BLUE SHIELD | Admitting: Nurse Practitioner

## 2015-06-27 ENCOUNTER — Telehealth: Payer: Self-pay

## 2015-06-27 NOTE — Telephone Encounter (Signed)
This patient was noted to be on coreg bid for CHF but noted in past medication hx that the patient stopped taking this in may or July?  Outreach to the patient and LVM to explore dc of coreg.  03/15/15 Transthoracic Echocardiography with EF of 30 to 35%;

## 2015-06-28 NOTE — Telephone Encounter (Signed)
2nd outreach to discuss coreg discontinuation but the patient did not answer. Did not leave another message.

## 2015-07-03 ENCOUNTER — Ambulatory Visit: Payer: BLUE CROSS/BLUE SHIELD | Admitting: Internal Medicine

## 2015-07-03 DIAGNOSIS — Z0289 Encounter for other administrative examinations: Secondary | ICD-10-CM

## 2015-07-13 ENCOUNTER — Other Ambulatory Visit: Payer: Self-pay | Admitting: Internal Medicine

## 2015-07-25 ENCOUNTER — Other Ambulatory Visit (INDEPENDENT_AMBULATORY_CARE_PROVIDER_SITE_OTHER): Payer: BLUE CROSS/BLUE SHIELD

## 2015-07-25 ENCOUNTER — Encounter: Payer: Self-pay | Admitting: Internal Medicine

## 2015-07-25 ENCOUNTER — Ambulatory Visit (INDEPENDENT_AMBULATORY_CARE_PROVIDER_SITE_OTHER): Payer: BLUE CROSS/BLUE SHIELD | Admitting: Internal Medicine

## 2015-07-25 VITALS — BP 110/70 | HR 68 | Temp 98.4°F | Resp 16 | Ht 67.0 in | Wt 267.0 lb

## 2015-07-25 DIAGNOSIS — E038 Other specified hypothyroidism: Secondary | ICD-10-CM

## 2015-07-25 DIAGNOSIS — N289 Disorder of kidney and ureter, unspecified: Secondary | ICD-10-CM

## 2015-07-25 DIAGNOSIS — E785 Hyperlipidemia, unspecified: Secondary | ICD-10-CM | POA: Diagnosis not present

## 2015-07-25 DIAGNOSIS — IMO0002 Reserved for concepts with insufficient information to code with codable children: Secondary | ICD-10-CM

## 2015-07-25 DIAGNOSIS — J984 Other disorders of lung: Secondary | ICD-10-CM

## 2015-07-25 DIAGNOSIS — M1000 Idiopathic gout, unspecified site: Secondary | ICD-10-CM | POA: Diagnosis not present

## 2015-07-25 DIAGNOSIS — E1165 Type 2 diabetes mellitus with hyperglycemia: Secondary | ICD-10-CM | POA: Diagnosis not present

## 2015-07-25 DIAGNOSIS — R918 Other nonspecific abnormal finding of lung field: Secondary | ICD-10-CM | POA: Insufficient documentation

## 2015-07-25 DIAGNOSIS — E1140 Type 2 diabetes mellitus with diabetic neuropathy, unspecified: Secondary | ICD-10-CM

## 2015-07-25 LAB — URIC ACID: Uric Acid, Serum: 5.6 mg/dL (ref 2.4–7.0)

## 2015-07-25 LAB — BASIC METABOLIC PANEL
BUN: 64 mg/dL — AB (ref 6–23)
CO2: 31 mEq/L (ref 19–32)
Calcium: 9.1 mg/dL (ref 8.4–10.5)
Chloride: 99 mEq/L (ref 96–112)
Creatinine, Ser: 1.7 mg/dL — ABNORMAL HIGH (ref 0.40–1.20)
GFR: 38.91 mL/min — ABNORMAL LOW (ref >60.00)
GLUCOSE: 135 mg/dL — AB (ref 70–99)
Potassium: 3.6 mEq/L (ref 3.5–5.1)
Sodium: 140 mEq/L (ref 135–145)

## 2015-07-25 LAB — TSH: TSH: 5 u[IU]/mL — ABNORMAL HIGH (ref 0.35–4.50)

## 2015-07-25 LAB — CK: Total CK: 559 U/L — ABNORMAL HIGH (ref 7–177)

## 2015-07-25 LAB — HEMOGLOBIN A1C: Hgb A1c MFr Bld: 7.8 % — ABNORMAL HIGH (ref 4.6–6.5)

## 2015-07-25 NOTE — Progress Notes (Signed)
Pre visit review using our clinic review tool, if applicable. No additional management support is needed unless otherwise documented below in the visit note. 

## 2015-07-25 NOTE — Patient Instructions (Signed)

## 2015-07-25 NOTE — Progress Notes (Signed)
Subjective:  Patient ID: Christine Knight, female    DOB: Apr 08, 1951  Age: 64 y.o. MRN: [id number]  CC: Hypothyroidism and Diabetes   HPI Christine Knight presents for follow-up on diabetes and hypothyroidism. She also wants to inquire about her recent CT scan of the lungs. She was admitted to the hospital a couple months ago with a respiratory illness and had a CT scan  that raised suspicion for a left lower lobe mass and cryptogenic organizing pneumonia. All of her pulmonary symptoms have resolved, she has no cough, shortness of breath, or wheezing. She is dealing with quite a bit of pain in her ankles, knees and fingers. She has been seeing a rheumatologist about this.  Outpatient Prescriptions Prior to Visit  Medication Sig Dispense Refill  . acetaminophen (TYLENOL) 325 MG tablet Take 650 mg by mouth every 6 (six) hours as needed for mild pain.    Marland Kitchen aspirin 81 MG tablet Take 1 tablet (81 mg total) by mouth daily. 30 tablet 9  . atorvastatin (LIPITOR) 80 MG tablet Take 1 tablet (80 mg total) by mouth daily at 6 PM. 30 tablet 0  . blood glucose meter kit and supplies KIT Dispense based on patient and insurance preference. Use up to four times daily as directed. (FOR ICD-9 250.00, 250.01). 1 each 0  . Blood Glucose Monitoring Suppl (ONETOUCH VERIO) W/DEVICE KIT 1 Act by Does not apply route 3 (three) times daily. 2 kit 0  . clopidogrel (PLAVIX) 75 MG tablet Take 1 tablet (75 mg total) by mouth daily. 30 tablet 0  . furosemide (LASIX) 80 MG tablet Take 1 tablet (80 mg total) by mouth 2 (two) times daily. 60 tablet 0  . glipiZIDE (GLUCOTROL) 10 MG tablet Take 10 mg by mouth 2 (two) times daily.  1  . glipiZIDE (GLUCOTROL) 10 MG tablet TAKE 1 TABLET BY MOUTH TWICE DAILY BEFORE A MEAL 180 tablet 1  . glucose blood test strip Use TID 100 each 12  . sitaGLIPtin-metformin (JANUMET) 50-1000 MG per tablet Take 1 tablet by mouth 2 (two) times daily with a meal. 180 tablet 1  . Vitamin D,  Ergocalciferol, (DRISDOL) 50000 UNITS CAPS capsule Take 50,000 Units by mouth 2 (two) times a week.   0  . benzonatate (TESSALON) 100 MG capsule Take 1 capsule (100 mg total) by mouth 3 (three) times daily. 40 capsule 1   No facility-administered medications prior to visit.    ROS Review of Systems  Constitutional: Positive for fatigue and unexpected weight change. Negative for fever, chills, diaphoresis and appetite change.  HENT: Negative.   Eyes: Negative.  Negative for photophobia and visual disturbance.  Respiratory: Negative.  Negative for cough, choking, chest tightness, shortness of breath, wheezing and stridor.   Cardiovascular: Negative.  Negative for chest pain, palpitations and leg swelling.  Gastrointestinal: Negative.  Negative for nausea, vomiting, abdominal pain, diarrhea, constipation and blood in stool.  Endocrine: Negative.  Negative for polydipsia, polyphagia and polyuria.  Genitourinary: Negative.  Negative for dysuria and difficulty urinating.  Musculoskeletal: Positive for arthralgias. Negative for myalgias, back pain and joint swelling.  Skin: Negative.  Negative for rash.  Allergic/Immunologic: Negative.   Neurological: Negative.  Negative for weakness.  Hematological: Negative.  Negative for adenopathy. Does not bruise/bleed easily.  Psychiatric/Behavioral: Negative.     Objective:  BP 110/70 mmHg  Pulse 68  Temp(Src) 98.4 F (36.9 C) (Oral)  Resp 16  Ht _0  (1.702 m)  Wt 267 lb (121.11 kg)  BMI 41.81 kg/m2  SpO2 98%  BP Readings from Last 3 Encounters:  07/25/15 110/70  06/10/15 117/66  05/16/15 118/78    Wt Readings from Last 3 Encounters:  07/25/15 267 lb (121.11 kg)  06/08/15 266 lb 12.8 oz (121.02 kg)  05/16/15 284 lb (128.822 kg)    Physical Exam  Constitutional: She is oriented to person, place, and time. No distress.  HENT:  Mouth/Throat: Oropharynx is clear and moist. No oropharyngeal exudate.  Eyes: Conjunctivae are normal.  Right eye exhibits no discharge. Left eye exhibits no discharge. No scleral icterus.  Neck: Normal range of motion. Neck supple. No JVD present. No tracheal deviation present. No thyromegaly present.  Cardiovascular: Normal rate, regular rhythm, normal heart sounds and intact distal pulses.  Exam reveals no gallop and no friction rub.   No murmur heard. Pulmonary/Chest: Effort normal and breath sounds normal. No stridor. No respiratory distress. She has no wheezes. She has no rales. She exhibits no tenderness.  Abdominal: Soft. Bowel sounds are normal. She exhibits no distension and no mass. There is no tenderness. There is no rebound and no guarding.  Musculoskeletal: Normal range of motion. She exhibits no edema or tenderness.  Lymphadenopathy:    She has no cervical adenopathy.  Neurological: She is oriented to person, place, and time.  Skin: Skin is warm and dry. No rash noted. She is not diaphoretic. No erythema. No pallor.  Psychiatric: She has a normal mood and affect. Her behavior is normal. Judgment and thought content normal.  Vitals reviewed.   Lab Results  Component Value Date   WBC 5.7 06/02/2015   HGB 12.8 06/02/2015   HCT 38.0 06/02/2015   PLT 247 06/02/2015   GLUCOSE 208* 06/09/2015   CHOL 143 06/01/2015   TRIG 122 06/01/2015   HDL 21* 06/01/2015   LDLCALC 98 06/01/2015   ALT 56* 06/02/2015   AST 56* 06/02/2015   NA 136 06/09/2015   K 4.0 06/09/2015   CL 101 06/09/2015   CREATININE 0.93 06/09/2015   BUN 25* 06/09/2015   CO2 28 06/09/2015   TSH 2.55 10/10/2014   INR 1.28 03/16/2015   HGBA1C 8.0* 05/29/2015   MICROALBUR 1.8 10/10/2014    Dg Chest 2 View  05/29/2015   CLINICAL DATA:  Cough, congestion, wheezing and fever today.  EXAM: CHEST  2 VIEW  COMPARISON:  May 16, 2015  FINDINGS: The heart size and mediastinal contours are stable. The heart size is enlarged. Patient status post prior CABG and median sternotomy. There is pulmonary edema. There is no focal  pneumonia. There is no pleural effusion. The visualized skeletal structures are stable.  IMPRESSION: Congestive heart failure.   Electronically Signed   By: Abelardo Diesel M.D.   On: 05/29/2015 14:50   Ct Head Wo Contrast  05/29/2015   CLINICAL DATA:  Generalized weakness for 1 week, slurred speech.  EXAM: CT HEAD WITHOUT CONTRAST  TECHNIQUE: Contiguous axial images were obtained from the base of the skull through the vertex without intravenous contrast.  COMPARISON:  MRI 12/19/2013  FINDINGS: No acute intracranial abnormality. Specifically, no hemorrhage, hydrocephalus, mass lesion, acute infarction, or significant intracranial injury. No acute calvarial abnormality. Visualized paranasal sinuses and mastoids clear. Orbital soft tissues unremarkable.  IMPRESSION: Negative.   Electronically Signed   By: Rolm Baptise M.D.   On: 05/29/2015 12:14    Assessment & Plan:   Falon was seen today for hypothyroidism and diabetes.  Diagnoses and all orders for this visit:  Other specified hypothyroidism- her TSH is slightly elevated, she has an appointment later this week with endocrinology and will see if it is thought that she needs to be on thyroid replacement therapy. -     TSH; Future  Type 2 diabetes, uncontrolled, with neuropathy- her blood sugars are better but still not adequately controlled, she has an point a later this week with endocrinology. -     Ambulatory referral to Ophthalmology -     Amb Referral to Nutrition and Diabetic E -     Basic metabolic panel; Future -     Hemoglobin A1c; Future  Painful diabetic neuropathy  Mass of lower lobe of left lung- I have ordered a repeat CT scan of her chest to see if this is an area of atelectasis rather not its infection or malignancy. -     CT Chest W Contrast; Future  Renal insufficiency- she has a normal volume status today but her BUN and creatinine showed that she is prerenal with mild insufficiency renal insufficiency. I've asked her to  be more liberal with her fluid intake. -     Basic metabolic panel; Future  Idiopathic gout, unspecified chronicity, unspecified site -     Uric acid; Future  Hyperlipidemia LDL goal <70- she has pain and her CPK level is elevated. I have asked her to hold the statin for now. -     CK; Future -     TSH; Future   I have discontinued Ms. Shells's benzonatate. I am also having her maintain her aspirin EC, glucose blood, ONETOUCH VERIO, Vitamin D (Ergocalciferol), glipiZIDE, sitaGLIPtin-metformin, acetaminophen, atorvastatin, clopidogrel, furosemide, blood glucose meter kit and supplies, glipiZIDE, allopurinol, and Colchicine.  Meds ordered this encounter  Medications  . allopurinol (ZYLOPRIM) 300 MG tablet    Sig: TK 1/2 T PO Q DAY. INCREASE TO 1 PO Q DAY. START 2 WKS AFTER ALLOPURINOL    Refill:  2  . Colchicine 0.6 MG CAPS    Sig:   . DISCONTD: atorvastatin (LIPITOR) 10 MG tablet    Sig: TK 1 T PO D    Refill:  1     Follow-up: Return in about 6 months (around 01/22/2016).  Scarlette Calico, MD

## 2015-07-28 ENCOUNTER — Ambulatory Visit: Payer: BLUE CROSS/BLUE SHIELD | Admitting: Internal Medicine

## 2015-08-13 ENCOUNTER — Other Ambulatory Visit: Payer: Self-pay | Admitting: Cardiology

## 2015-08-15 ENCOUNTER — Other Ambulatory Visit: Payer: Self-pay

## 2015-08-16 ENCOUNTER — Ambulatory Visit: Payer: BLUE CROSS/BLUE SHIELD | Admitting: Nurse Practitioner

## 2015-08-22 ENCOUNTER — Encounter: Payer: Self-pay | Admitting: Internal Medicine

## 2015-08-23 ENCOUNTER — Encounter: Payer: Self-pay | Admitting: Nurse Practitioner

## 2015-09-11 ENCOUNTER — Telehealth: Payer: Self-pay

## 2015-09-11 NOTE — Telephone Encounter (Signed)
Call to the patient and lvm to call back; Outreach is to confirm if she on Coreg or if she stopped it.  It would appear Cardiologist noted she was on this medication in July.

## 2015-09-12 ENCOUNTER — Other Ambulatory Visit: Payer: Self-pay | Admitting: Cardiology

## 2015-09-26 ENCOUNTER — Encounter: Payer: Self-pay | Admitting: Internal Medicine

## 2015-12-03 ENCOUNTER — Other Ambulatory Visit: Payer: Self-pay | Admitting: Cardiology

## 2015-12-04 ENCOUNTER — Other Ambulatory Visit: Payer: Self-pay | Admitting: Cardiology

## 2015-12-08 ENCOUNTER — Encounter (HOSPITAL_COMMUNITY): Payer: Self-pay

## 2015-12-08 ENCOUNTER — Emergency Department (HOSPITAL_COMMUNITY): Payer: Medicaid Other

## 2015-12-08 ENCOUNTER — Inpatient Hospital Stay (HOSPITAL_COMMUNITY)
Admission: EM | Admit: 2015-12-08 | Discharge: 2015-12-28 | DRG: 286 | Disposition: A | Discharge status: Home Health | Payer: Medicaid Other | Attending: Internal Medicine | Admitting: Internal Medicine

## 2015-12-08 DIAGNOSIS — E039 Hypothyroidism, unspecified: Secondary | ICD-10-CM | POA: Diagnosis present

## 2015-12-08 DIAGNOSIS — R001 Bradycardia, unspecified: Secondary | ICD-10-CM | POA: Diagnosis not present

## 2015-12-08 DIAGNOSIS — I255 Ischemic cardiomyopathy: Secondary | ICD-10-CM | POA: Diagnosis present

## 2015-12-08 DIAGNOSIS — Z6841 Body Mass Index (BMI) 40.0 and over, adult: Secondary | ICD-10-CM

## 2015-12-08 DIAGNOSIS — R0789 Other chest pain: Secondary | ICD-10-CM | POA: Insufficient documentation

## 2015-12-08 DIAGNOSIS — N179 Acute kidney failure, unspecified: Secondary | ICD-10-CM | POA: Diagnosis present

## 2015-12-08 DIAGNOSIS — I509 Heart failure, unspecified: Secondary | ICD-10-CM | POA: Diagnosis not present

## 2015-12-08 DIAGNOSIS — I340 Nonrheumatic mitral (valve) insufficiency: Secondary | ICD-10-CM | POA: Diagnosis present

## 2015-12-08 DIAGNOSIS — Z87891 Personal history of nicotine dependence: Secondary | ICD-10-CM | POA: Diagnosis not present

## 2015-12-08 DIAGNOSIS — I272 Other secondary pulmonary hypertension: Secondary | ICD-10-CM | POA: Diagnosis present

## 2015-12-08 DIAGNOSIS — E1122 Type 2 diabetes mellitus with diabetic chronic kidney disease: Secondary | ICD-10-CM | POA: Diagnosis present

## 2015-12-08 DIAGNOSIS — Z7984 Long term (current) use of oral hypoglycemic drugs: Secondary | ICD-10-CM

## 2015-12-08 DIAGNOSIS — I472 Ventricular tachycardia: Secondary | ICD-10-CM | POA: Diagnosis not present

## 2015-12-08 DIAGNOSIS — I493 Ventricular premature depolarization: Secondary | ICD-10-CM | POA: Diagnosis not present

## 2015-12-08 DIAGNOSIS — Z7982 Long term (current) use of aspirin: Secondary | ICD-10-CM | POA: Diagnosis not present

## 2015-12-08 DIAGNOSIS — I130 Hypertensive heart and chronic kidney disease with heart failure and stage 1 through stage 4 chronic kidney disease, or unspecified chronic kidney disease: Principal | ICD-10-CM | POA: Diagnosis present

## 2015-12-08 DIAGNOSIS — I2510 Atherosclerotic heart disease of native coronary artery without angina pectoris: Secondary | ICD-10-CM | POA: Diagnosis present

## 2015-12-08 DIAGNOSIS — I5043 Acute on chronic combined systolic (congestive) and diastolic (congestive) heart failure: Secondary | ICD-10-CM | POA: Diagnosis present

## 2015-12-08 DIAGNOSIS — E871 Hypo-osmolality and hyponatremia: Secondary | ICD-10-CM | POA: Diagnosis not present

## 2015-12-08 DIAGNOSIS — E785 Hyperlipidemia, unspecified: Secondary | ICD-10-CM | POA: Diagnosis present

## 2015-12-08 DIAGNOSIS — E038 Other specified hypothyroidism: Secondary | ICD-10-CM

## 2015-12-08 DIAGNOSIS — I252 Old myocardial infarction: Secondary | ICD-10-CM | POA: Diagnosis not present

## 2015-12-08 DIAGNOSIS — E1140 Type 2 diabetes mellitus with diabetic neuropathy, unspecified: Secondary | ICD-10-CM | POA: Diagnosis not present

## 2015-12-08 DIAGNOSIS — Z888 Allergy status to other drugs, medicaments and biological substances status: Secondary | ICD-10-CM

## 2015-12-08 DIAGNOSIS — Z7902 Long term (current) use of antithrombotics/antiplatelets: Secondary | ICD-10-CM

## 2015-12-08 DIAGNOSIS — Z951 Presence of aortocoronary bypass graft: Secondary | ICD-10-CM

## 2015-12-08 DIAGNOSIS — IMO0002 Reserved for concepts with insufficient information to code with codable children: Secondary | ICD-10-CM | POA: Diagnosis present

## 2015-12-08 DIAGNOSIS — Z79899 Other long term (current) drug therapy: Secondary | ICD-10-CM | POA: Diagnosis not present

## 2015-12-08 DIAGNOSIS — N183 Chronic kidney disease, stage 3 (moderate): Secondary | ICD-10-CM | POA: Diagnosis not present

## 2015-12-08 DIAGNOSIS — E1165 Type 2 diabetes mellitus with hyperglycemia: Secondary | ICD-10-CM | POA: Diagnosis not present

## 2015-12-08 DIAGNOSIS — I5022 Chronic systolic (congestive) heart failure: Secondary | ICD-10-CM

## 2015-12-08 DIAGNOSIS — E876 Hypokalemia: Secondary | ICD-10-CM | POA: Diagnosis not present

## 2015-12-08 LAB — COMPREHENSIVE METABOLIC PANEL
ALT: 12 U/L — AB (ref 14–54)
ANION GAP: 11 (ref 5–15)
AST: 28 U/L (ref 15–41)
Albumin: 3.5 g/dL (ref 3.5–5.0)
Alkaline Phosphatase: 117 U/L (ref 38–126)
BUN: 22 mg/dL — ABNORMAL HIGH (ref 6–20)
CALCIUM: 9 mg/dL (ref 8.9–10.3)
CHLORIDE: 97 mmol/L — AB (ref 101–111)
CO2: 29 mmol/L (ref 22–32)
CREATININE: 1.31 mg/dL — AB (ref 0.44–1.00)
GFR, EST AFRICAN AMERICAN: 49 mL/min — AB (ref >60)
GFR, EST NON AFRICAN AMERICAN: 42 mL/min — AB (ref >60)
Glucose, Bld: 173 mg/dL — ABNORMAL HIGH (ref 65–99)
Potassium: 4 mmol/L (ref 3.5–5.1)
Sodium: 137 mmol/L (ref 135–145)
Total Bilirubin: 1.5 mg/dL — ABNORMAL HIGH (ref 0.3–1.2)
Total Protein: 8.1 g/dL (ref 6.5–8.1)

## 2015-12-08 LAB — CBC WITH DIFFERENTIAL/PLATELET
Basophils Absolute: 0 10*3/uL (ref 0.0–0.1)
Basophils Relative: 1 %
EOS ABS: 0.2 10*3/uL (ref 0.0–0.7)
EOS PCT: 3 %
HCT: 43.5 % (ref 36.0–46.0)
Hemoglobin: 13.7 g/dL (ref 12.0–15.0)
LYMPHS ABS: 0.9 10*3/uL (ref 0.7–4.0)
LYMPHS PCT: 15 %
MCH: 29.7 pg (ref 26.0–34.0)
MCHC: 31.5 g/dL (ref 30.0–36.0)
MCV: 94.2 fL (ref 78.0–100.0)
MONO ABS: 0.6 10*3/uL (ref 0.1–1.0)
MONOS PCT: 11 %
Neutro Abs: 3.9 10*3/uL (ref 1.7–7.7)
Neutrophils Relative %: 70 %
PLATELETS: 275 10*3/uL (ref 150–400)
RBC: 4.62 MIL/uL (ref 3.87–5.11)
RDW: 16.5 % — ABNORMAL HIGH (ref 11.5–15.5)
WBC: 5.5 10*3/uL (ref 4.0–10.5)

## 2015-12-08 LAB — GLUCOSE, CAPILLARY
GLUCOSE-CAPILLARY: 112 mg/dL — AB (ref 65–99)
GLUCOSE-CAPILLARY: 137 mg/dL — AB (ref 65–99)

## 2015-12-08 LAB — I-STAT TROPONIN, ED: TROPONIN I, POC: 0.04 ng/mL (ref 0.00–0.08)

## 2015-12-08 LAB — TROPONIN I
Troponin I: 0.03 ng/mL (ref <0.031)
Troponin I: 0.03 ng/mL (ref <0.031)

## 2015-12-08 LAB — BRAIN NATRIURETIC PEPTIDE: B Natriuretic Peptide: 1155.5 pg/mL — ABNORMAL HIGH (ref 0.0–100.0)

## 2015-12-08 LAB — TSH: TSH: 3.253 u[IU]/mL (ref 0.350–4.500)

## 2015-12-08 MED ORDER — INSULIN ASPART 100 UNIT/ML ~~LOC~~ SOLN
0.0000 [IU] | Freq: Every day | SUBCUTANEOUS | Status: DC
  Administered 2015-12-11: 2 [IU] via SUBCUTANEOUS
  Administered 2015-12-14: 3 [IU] via SUBCUTANEOUS
  Administered 2015-12-18 – 2015-12-24 (×7): 2 [IU] via SUBCUTANEOUS

## 2015-12-08 MED ORDER — SODIUM CHLORIDE 0.9 % IV SOLN: 250.0000 mL | INTRAVENOUS | Status: DC | PRN

## 2015-12-08 MED ORDER — ACETAMINOPHEN 325 MG PO TABS
650.0000 mg | ORAL_TABLET | Freq: Four times a day (QID) | ORAL | Status: DC | PRN
  Administered 2015-12-18 – 2015-12-26 (×2): 650 mg via ORAL
  Filled 2015-12-08 (×2): qty 2

## 2015-12-08 MED ORDER — ATORVASTATIN CALCIUM 10 MG PO TABS: 10.0000 mg | ORAL_TABLET | Freq: Every day | ORAL | Status: DC

## 2015-12-08 MED ORDER — NITROGLYCERIN 2 % TD OINT
1.0000 [in_us] | TOPICAL_OINTMENT | Freq: Once | TRANSDERMAL | Status: AC
  Administered 2015-12-08: 1 [in_us] via TOPICAL
  Filled 2015-12-08: qty 1

## 2015-12-08 MED ORDER — NITROGLYCERIN IN D5W 200-5 MCG/ML-% IV SOLN
10.0000 ug/min | INTRAVENOUS | Status: DC
  Administered 2015-12-08: 10 ug/min via INTRAVENOUS
  Filled 2015-12-08: qty 250

## 2015-12-08 MED ORDER — CLOPIDOGREL BISULFATE 75 MG PO TABS: 75.0000 mg | ORAL_TABLET | Freq: Every day | ORAL | Status: DC

## 2015-12-08 MED ORDER — INSULIN ASPART 100 UNIT/ML ~~LOC~~ SOLN
0.0000 [IU] | Freq: Three times a day (TID) | SUBCUTANEOUS | Status: DC
  Administered 2015-12-10: 1 [IU] via SUBCUTANEOUS
  Administered 2015-12-11 – 2015-12-13 (×6): 2 [IU] via SUBCUTANEOUS
  Administered 2015-12-13: 1 [IU] via SUBCUTANEOUS
  Administered 2015-12-14 (×2): 2 [IU] via SUBCUTANEOUS
  Administered 2015-12-14: 3 [IU] via SUBCUTANEOUS
  Administered 2015-12-15: 1 [IU] via SUBCUTANEOUS

## 2015-12-08 MED ORDER — METFORMIN HCL 500 MG PO TABS
1000.0000 mg | ORAL_TABLET | Freq: Two times a day (BID) | ORAL | Status: DC
Start: 2015-12-08 — End: 2015-12-09
  Administered 2015-12-08: 1000 mg via ORAL
  Filled 2015-12-08 (×4): qty 2

## 2015-12-08 MED ORDER — ONDANSETRON HCL 4 MG PO TABS: 4.0000 mg | ORAL_TABLET | Freq: Four times a day (QID) | ORAL | Status: DC | PRN

## 2015-12-08 MED ORDER — OXYCODONE HCL 5 MG PO TABS
5.0000 mg | ORAL_TABLET | ORAL | Status: DC | PRN
  Administered 2015-12-14 – 2015-12-24 (×4): 5 mg via ORAL
  Filled 2015-12-08 (×4): qty 1

## 2015-12-08 MED ORDER — ACETAMINOPHEN 650 MG RE SUPP: 650.0000 mg | Freq: Four times a day (QID) | RECTAL | Status: DC | PRN

## 2015-12-08 MED ORDER — GUAIFENESIN 100 MG/5ML PO SOLN
5.0000 mL | ORAL | Status: DC | PRN
  Administered 2015-12-08 (×2): 100 mg via ORAL
  Filled 2015-12-08 (×2): qty 10

## 2015-12-08 MED ORDER — ISOSORB DINITRATE-HYDRALAZINE 20-37.5 MG PO TABS
1.0000 | ORAL_TABLET | Freq: Two times a day (BID) | ORAL | Status: DC
  Administered 2015-12-08 – 2015-12-17 (×18): 1 via ORAL
  Filled 2015-12-08 (×19): qty 1

## 2015-12-08 MED ORDER — ASPIRIN 81 MG PO CHEW
324.0000 mg | CHEWABLE_TABLET | Freq: Once | ORAL | Status: AC
  Administered 2015-12-08: 324 mg via ORAL
  Filled 2015-12-08: qty 4

## 2015-12-08 MED ORDER — ONDANSETRON HCL 4 MG/2ML IJ SOLN: 4.0000 mg | Freq: Four times a day (QID) | INTRAMUSCULAR | Status: DC | PRN

## 2015-12-08 MED ORDER — ASPIRIN EC 81 MG PO TBEC
81.0000 mg | DELAYED_RELEASE_TABLET | Freq: Every day | ORAL | Status: DC
  Administered 2015-12-08 – 2015-12-28 (×21): 81 mg via ORAL
  Filled 2015-12-08 (×21): qty 1

## 2015-12-08 MED ORDER — ALUM & MAG HYDROXIDE-SIMETH 200-200-20 MG/5ML PO SUSP: 30.0000 mL | Freq: Four times a day (QID) | ORAL | Status: DC | PRN

## 2015-12-08 MED ORDER — FUROSEMIDE 10 MG/ML IJ SOLN
80.0000 mg | Freq: Once | INTRAMUSCULAR | Status: AC
  Administered 2015-12-08: 80 mg via INTRAVENOUS
  Filled 2015-12-08: qty 8

## 2015-12-08 MED ORDER — ALBUTEROL SULFATE (2.5 MG/3ML) 0.083% IN NEBU
5.0000 mg | INHALATION_SOLUTION | Freq: Once | RESPIRATORY_TRACT | Status: AC
  Administered 2015-12-08: 5 mg via RESPIRATORY_TRACT
  Filled 2015-12-08: qty 6

## 2015-12-08 MED ORDER — LINAGLIPTIN 5 MG PO TABS
5.0000 mg | ORAL_TABLET | Freq: Every day | ORAL | Status: DC
  Filled 2015-12-08: qty 1

## 2015-12-08 MED ORDER — ADULT MULTIVITAMIN W/MINERALS CH
1.0000 | ORAL_TABLET | Freq: Every day | ORAL | Status: DC
  Administered 2015-12-09 – 2015-12-28 (×20): 1 via ORAL
  Filled 2015-12-08 (×20): qty 1

## 2015-12-08 MED ORDER — ENOXAPARIN SODIUM 40 MG/0.4ML ~~LOC~~ SOLN
40.0000 mg | SUBCUTANEOUS | Status: DC
  Filled 2015-12-08 (×6): qty 0.4

## 2015-12-08 MED ORDER — SODIUM CHLORIDE 0.9% FLUSH: 3.0000 mL | INTRAVENOUS | Status: DC | PRN

## 2015-12-08 MED ORDER — FUROSEMIDE 10 MG/ML IJ SOLN
80.0000 mg | Freq: Four times a day (QID) | INTRAMUSCULAR | Status: DC
  Administered 2015-12-08 – 2015-12-09 (×4): 80 mg via INTRAVENOUS
  Filled 2015-12-08 (×7): qty 8

## 2015-12-08 MED ORDER — POLYETHYLENE GLYCOL 3350 17 G PO PACK
17.0000 g | PACK | Freq: Every day | ORAL | Status: DC | PRN
  Administered 2015-12-19: 17 g via ORAL
  Filled 2015-12-08: qty 1

## 2015-12-08 MED ORDER — SODIUM CHLORIDE 0.9% FLUSH
3.0000 mL | Freq: Two times a day (BID) | INTRAVENOUS | Status: DC
  Administered 2015-12-09 – 2015-12-11 (×2): 3 mL via INTRAVENOUS

## 2015-12-08 MED ORDER — SITAGLIPTIN PHOS-METFORMIN HCL 50-1000 MG PO TABS: 1.0000 | ORAL_TABLET | Freq: Two times a day (BID) | ORAL | Status: DC

## 2015-12-08 MED ORDER — POTASSIUM CHLORIDE CRYS ER 20 MEQ PO TBCR
20.0000 meq | EXTENDED_RELEASE_TABLET | Freq: Two times a day (BID) | ORAL | Status: DC
  Administered 2015-12-08 – 2015-12-11 (×5): 20 meq via ORAL
  Filled 2015-12-08 (×7): qty 1

## 2015-12-08 MED ORDER — SODIUM CHLORIDE 0.9% FLUSH
3.0000 mL | Freq: Two times a day (BID) | INTRAVENOUS | Status: DC
  Administered 2015-12-08 – 2015-12-14 (×6): 3 mL via INTRAVENOUS

## 2015-12-08 MED ORDER — GLIPIZIDE 10 MG PO TABS
10.0000 mg | ORAL_TABLET | Freq: Two times a day (BID) | ORAL | Status: DC
  Administered 2015-12-08: 10 mg via ORAL
  Filled 2015-12-08 (×3): qty 1

## 2015-12-08 NOTE — ED Provider Notes (Signed)
CSN: [id number]     Arrival date & time 12/08/15  0806 History   First MD Initiated Contact with Patient 12/08/15 [phone]     Chief Complaint  Patient presents with  . Shortness of Breath  . Cough     (Consider location/radiation/quality/duration/timing/severity/associated sxs/prior Treatment) HPI Patient with history of chronic artery disease and ischemic cardiomyopathy presents with increasing dyspnea on exertion and orthopnea for the last couple weeks but worse over the last 2 days. They she's had a cough productive of white sputum. States she's been compliant with her medications that she did not take her morning meds. She's had increased swelling in her abdomen and bilateral lower extremities. She denies any fever. She denies chest pain but states she does have some central chest pressure. Past Medical History  Diagnosis Date  . Obesity, morbid ([place])   . Chronic systolic heart failure (HCC)     Chronic systolic CHF  . Hyperlipidemia 08/10/11  . Diabetes mellitus type 2 in obese ([place]) 08/10/11  . CAD (coronary artery disease) 08/2011    NSTEMI with subsequent CABG 08/13/2011 with LIMA-LAD, SVG-diagonal, SVG-OM, SVG-PDA  . Ischemic cardiomyopathy 08/13/2011    EF 20% in 08/2011, still 25% 10/21/2011  . Pleural effusion     Requiring L thoracentesis 09/02/11  . Mitral regurgitation     Mild by echo 10/21/11  . Hypertension   . Pulmonary hypertension ([place])     Echo, September, 2013, 72 mmHg.  Marland Kitchen Ejection fraction < 50%     EF 20%, October, 2012  //  EF 25% December, 2012  //   EF 35%, echo, September, 2013  . Myocardial infarction ([place]) 08/2011  . Orthopnoea     "progressively worse over last 3 wks" (03/17/2013)  . CHF (congestive heart failure) ([place])   . Carpal tunnel syndrome on both sides     "post OHS in 08/2011; resolved now" (03/17/2013)   Past Surgical History  Procedure Laterality Date  . Laparoscopy  1980's?    "removed gallstones; not gallbladder" (03/17/2013)  . Coronary  artery bypass graft  08/13/11    CABG x4 with LIMA to LAD, SVG to Diag, SVG to OM, SVG to PDA, EVH via both thighs   Family History  Problem Relation Age of Onset  . Heart disease Father   . Cancer Neg Hx   . Diabetes Neg Hx   . Hypertension Neg Hx   . Kidney disease Neg Hx    Social History  Substance Use Topics  . Smoking status: Former Smoker    Types: Cigarettes    Quit date: 11/11/1978  . Smokeless tobacco: Never Used     Comment: 03/17/2013 "only a social smoker when I did smoke; ever bought any"  . Alcohol Use: Yes     Comment: 03/17/2013 "in the last 6 months I've had 1 glass of wine"   OB History    No data available     Review of Systems  Constitutional: Positive for chills, activity change and fatigue. Negative for fever.  HENT: Negative for congestion.   Respiratory: Positive for cough, chest tightness and shortness of breath.   Cardiovascular: Positive for leg swelling. Negative for chest pain and palpitations.  Gastrointestinal: Positive for abdominal distention. Negative for vomiting and abdominal pain.  Musculoskeletal: Negative for myalgias, arthralgias and neck pain.  Skin: Negative for rash and wound.  Neurological: Negative for dizziness, weakness, light-headedness, numbness and headaches.  All other systems reviewed and are negative.  Allergies  Lisinopril  Home Medications   Prior to Admission medications   Medication Sig Start Date End Date Taking? Authorizing Provider  acetaminophen (TYLENOL) 325 MG tablet Take 650 mg by mouth every 6 (six) hours as needed for mild pain.   Yes Historical Provider, MD  aspirin 81 MG tablet Take 1 tablet (81 mg total) by mouth daily. 11/15/13  Yes Carlena Bjornstad, MD  carvedilol (COREG) 25 MG tablet TAKE 1 TABLET BY MOUTH TWICE DAILY 12/04/15  Yes Carlena Bjornstad, MD  glipiZIDE (GLUCOTROL) 10 MG tablet Take 10 mg by mouth 2 (two) times daily. 02/20/15  Yes Historical Provider, MD  Multiple Vitamin (MULTIVITAMIN WITH  MINERALS) TABS tablet Take 1 tablet by mouth daily.   Yes Historical Provider, MD  sitaGLIPtin-metformin (JANUMET) 50-1000 MG per tablet Take 1 tablet by mouth 2 (two) times daily with a meal. 05/17/15  Yes Janith Lima, MD  torsemide (DEMADEX) 20 MG tablet Take 40 mg by mouth 2 (two) times daily. 11/06/15  Yes Historical Provider, MD  atorvastatin (LIPITOR) 10 MG tablet TAKE 1 TABLET BY MOUTH DAILY 08/14/15   Carlena Bjornstad, MD  Blood Glucose Monitoring Suppl (ONETOUCH VERIO) W/DEVICE KIT 1 Act by Does not apply route 3 (three) times daily. 10/12/14   Janith Lima, MD  clopidogrel (PLAVIX) 75 MG tablet Take 1 tablet (75 mg total) by mouth daily. 06/10/15   Geradine Girt, DO  furosemide (LASIX) 80 MG tablet Take 1 tablet (80 mg total) by mouth 2 (two) times daily. 06/10/15   Geradine Girt, DO  glucose blood test strip Use TID 10/12/14   Janith Lima, MD   BP 119/75 mmHg  Pulse 59  Temp(Src) 97.8 F (36.6 C) (Oral)  Resp 14  SpO2 97% Physical Exam  Constitutional: She is oriented to person, place, and time. She appears well-developed and well-nourished.  Mild respiratory distress  HENT:  Head: Normocephalic and atraumatic.  Mouth/Throat: Oropharynx is clear and moist. No oropharyngeal exudate.  Eyes: EOM are normal. Pupils are equal, round, and reactive to light.  Neck: Normal range of motion. Neck supple.  Cardiovascular: Normal rate and regular rhythm.   Pulmonary/Chest: She is in respiratory distress. She has no wheezes. She has rales. She exhibits no tenderness.  Decreased air movement. Mild tachypnea. Crackles bilateral bases.  Abdominal: Soft. Bowel sounds are normal. She exhibits distension. She exhibits no mass. There is no tenderness. There is no rebound and no guarding.  Pitting anasarca to abdominal wall  Musculoskeletal: Normal range of motion. She exhibits edema. She exhibits no tenderness.  3+ bilateral lower extremity pitting edema.   Neurological: She is alert and  oriented to person, place, and time.  Moving all extremities without deficit. Sensation is fully intact.  Skin: Skin is warm and dry. No rash noted. No erythema.  Psychiatric: She has a normal mood and affect. Her behavior is normal.  Nursing note and vitals reviewed.   ED Course  Procedures (including critical care time) Labs Review Labs Reviewed  BRAIN NATRIURETIC PEPTIDE - Abnormal; Notable for the following:    B Natriuretic Peptide 1155.5 (*)    All other components within normal limits  CBC WITH DIFFERENTIAL/PLATELET - Abnormal; Notable for the following:    RDW 16.5 (*)    All other components within normal limits  COMPREHENSIVE METABOLIC PANEL - Abnormal; Notable for the following:    Chloride 97 (*)    Glucose, Bld 173 (*)    BUN  22 (*)    Creatinine, Ser 1.31 (*)    ALT 12 (*)    Total Bilirubin 1.5 (*)    GFR calc non Af Amer 42 (*)    GFR calc Af Amer 49 (*)    All other components within normal limits  Randolm Idol, ED    Imaging Review Dg Chest Port 1 View  12/08/2015  CLINICAL DATA:  Generalized pain and pressure for about 2 weeks EXAM: PORTABLE CHEST 1 VIEW COMPARISON:  06/09/15 FINDINGS: Cardiomegaly is noted. Status post CABG. Central vascular congestion and mild interstitial prominence suspicious for mild interstitial edema. There is left basilar atelectasis or infiltrate. Limited study by patient's large body habitus. IMPRESSION: Central vascular congestion and mild interstitial prominence suspicious for mild interstitial edema. There is left basilar atelectasis or infiltrate. Electronically Signed   By: Lahoma Crocker M.D.   On: 12/08/2015 09:23   I have personally reviewed and evaluated these images and lab results as part of my medical decision-making.   EKG Interpretation   Date/Time:  Friday December 08 2015 08:28:57 EST Ventricular Rate:  64 PR Interval:  170 QRS Duration: 109 QT Interval:  453 QTC Calculation: 467 R Axis:   141 Text  Interpretation:  Sinus rhythm Multiform ventricular premature  complexes Right axis deviation Low voltage, extremity and precordial leads  Probable anteroseptal infarct, old Confirmed by YELVERTON  MD, DAVID  ([id number]) on 12/08/2015 8:40:51 AM      MDM   Final diagnoses:  Acute on chronic congestive heart failure, unspecified congestive heart failure type (HCC)  Chest tightness   Patient's chest tightness has resolved. Her breathing has improved. On further questioning states she has gained 30-40 pounds in the last 2 weeks. Will discuss with [facility] and admit for CHF exacerbation.   Given patient's improved symptoms will take off the nitro drip and add Nitropaste. Discussed with Dr. Rockne Menghini. Will admit to telemetry bed  Julianne Rice, MD 12/08/15 1044

## 2015-12-08 NOTE — Progress Notes (Signed)
Pt transferred from ED to 1507. AO x4. Pt belongings are at pt bedside. Pt on 2L [place] saturating at 94%. Pt made aware of unit procedures and bed alarm was set. Informed pt to use call bell when need assistance.  Paticia Moster W Jaclyn Andy, RN

## 2015-12-08 NOTE — H&P (Addendum)
History and Physical:    Christine Knight   LTJ:[id number] DOB: Jun 27, 1951 DOA: 12/08/2015  Referring MD/provider: Dr. Lita Mains PCP: Christine Calico, MD   Chief Complaint: Worsening dyspnea  History of Present Illness:   Christine Knight is an 65 y.o. female with a PMH of chronic systolic and diastolic CHF (EF 07-62%, grade 3 diastolic dysfunction on Echo done 03/15/15) who presents with a 2 week history of progressive shortness of breath,  LE edema, and a 40 lb weight gain.  The patient reports that she has been taking all of her usual medications, and has not missed any doses.  She doubled the dose of the Torsemide for the past 3 days with no relief of symptoms.  Activity/exertion worsens symptoms. No recent medication changes.  No dietary indiscretions. Symptoms have been accompanied by chest tightness which began 2 days ago.  ROS otherwise noted below.    ROS:   Review of Systems  Constitutional: Positive for chills and malaise/fatigue. Negative for fever.       40 lb weight gain  HENT: Negative.   Respiratory: Positive for cough, sputum production and shortness of breath.        White sputum  Cardiovascular: Positive for chest pain, orthopnea, leg swelling and PND. Negative for palpitations.       Described as "pressure"  Gastrointestinal: Negative for nausea, vomiting, abdominal pain, diarrhea, constipation, blood in stool and melena.  Genitourinary: Negative.   Musculoskeletal: Positive for joint pain.  Skin: Positive for itching.  Neurological: Positive for dizziness.  Endo/Heme/Allergies: Bruises/bleeds easily.  Psychiatric/Behavioral: Negative.      Past Medical History:   Past Medical History  Diagnosis Date  . Obesity, morbid ([place])   . Chronic systolic heart failure (HCC)     Chronic systolic CHF  . Hyperlipidemia 08/10/11  . Diabetes mellitus type 2 in obese ([place]) 08/10/11  . CAD (coronary artery disease) 08/2011    NSTEMI with subsequent CABG 08/13/2011 with  LIMA-LAD, SVG-diagonal, SVG-OM, SVG-PDA  . Ischemic cardiomyopathy 08/13/2011    EF 20% in 08/2011, still 25% 10/21/2011  . Pleural effusion     Requiring L thoracentesis 09/02/11  . Mitral regurgitation     Mild by echo 10/21/11  . Hypertension   . Pulmonary hypertension ([place])     Echo, September, 2013, 72 mmHg.  Marland Kitchen Ejection fraction < 50%     EF 20%, October, 2012  //  EF 25% December, 2012  //   EF 35%, echo, September, 2013  . Myocardial infarction ([place]) 08/2011  . Orthopnoea     "progressively worse over last 3 wks" (03/17/2013)  . CHF (congestive heart failure) ([place])   . Carpal tunnel syndrome on both sides     "post OHS in 08/2011; resolved now" (03/17/2013)    Past Surgical History:   Past Surgical History  Procedure Laterality Date  . Laparoscopy  1980's?    "removed gallstones; not gallbladder" (03/17/2013)  . Coronary artery bypass graft  08/13/11    CABG x4 with LIMA to LAD, SVG to Diag, SVG to OM, SVG to PDA, EVH via both thighs    Social History:   Social History   Social History  . Marital Status: Divorced    Spouse Name: Christine Knight  . Number of Children: 3  . Years of Education: Christine Knight   Occupational History  . 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 Smoking status: Former Smoker    Types: Cigarettes    Quit  date: 11/11/1978  . Smokeless tobacco: Never Used     Comment: 03/17/2013 "only a social smoker when I did smoke; ever bought any"  . Alcohol Use: Yes     Comment: 03/17/2013 "in the last 6 months I've had 1 glass of wine"  . Drug Use: No  . Sexual Activity: Not Currently    Birth Control/ Protection: Post-menopausal   Other Topics Concern  . Not on file   Social History Narrative   Lives with her sister and daughter.  Ambulates independently.    Family history:   Family History  Problem Relation Age of Onset  . Heart disease Father   . Cancer Neg Hx   . Diabetes Neg Hx   . Kidney disease Neg Hx   . Hypertension Maternal Grandmother   .  Hypertension Sister     Allergies   Lisinopril  Current Medications:   Prior to Admission medications   Medication Sig Start Date End Date Taking? Authorizing Provider  acetaminophen (TYLENOL) 325 MG tablet Take 650 mg by mouth every 6 (six) hours as needed for mild pain.   Yes Historical Provider, MD  aspirin 81 MG tablet Take 1 tablet (81 mg total) by mouth daily. 11/15/13  Yes Carlena Bjornstad, MD  carvedilol (COREG) 25 MG tablet TAKE 1 TABLET BY MOUTH TWICE DAILY 12/04/15  Yes Carlena Bjornstad, MD  glipiZIDE (GLUCOTROL) 10 MG tablet Take 10 mg by mouth 2 (two) times daily. 02/20/15  Yes Historical Provider, MD  Multiple Vitamin (MULTIVITAMIN WITH MINERALS) TABS tablet Take 1 tablet by mouth daily.   Yes Historical Provider, MD  sitaGLIPtin-metformin (JANUMET) 50-1000 MG per tablet Take 1 tablet by mouth 2 (two) times daily with a meal. 05/17/15  Yes Janith Lima, MD  torsemide (DEMADEX) 20 MG tablet Take 40 mg by mouth 2 (two) times daily. 11/06/15  Yes Historical Provider, MD  atorvastatin (LIPITOR) 10 MG tablet TAKE 1 TABLET BY MOUTH DAILY 08/14/15   Carlena Bjornstad, MD  Blood Glucose Monitoring Suppl (ONETOUCH VERIO) W/DEVICE KIT 1 Act by Does not apply route 3 (three) times daily. 10/12/14   Janith Lima, MD  clopidogrel (PLAVIX) 75 MG tablet Take 1 tablet (75 mg total) by mouth daily. 06/10/15   Geradine Girt, DO  furosemide (LASIX) 80 MG tablet Take 1 tablet (80 mg total) by mouth 2 (two) times daily. 06/10/15   Geradine Girt, DO  glucose blood test strip Use TID 10/12/14   Janith Lima, MD    Physical Exam:   Filed Vitals:   12/08/15 1029 12/08/15 1030 12/08/15 1045 12/08/15 1140  BP: 119/75 120/81 110/66 117/78  Pulse: 59 60 61 56  Temp:      TempSrc:      Resp: '14 17 16 15  '$ SpO2: 97% 99% 98% 100%     Physical Exam: Blood pressure 117/78, pulse 56, temperature 97.8 F (36.6 C), temperature source Oral, resp. rate 15, SpO2 100 %. Gen: Moderate acute respiratory  distress. Head: Normocephalic, atraumatic. Eyes: PERRL, EOMI, sclerae nonicteric. Mouth: Oropharynx clear. Neck: Supple, no thyromegaly, no lymphadenopathy, no jugular venous distention. Chest: Lungs diminished at the bases with a few crackles. CV: Heart sounds are regular.  No M/R/G. Abdomen: Soft, nontender, nondistended with normal active bowel sounds. Extremities: Extremities show 3+ doughy pitting edema. Skin: Warm and dry. Neuro: Alert and oriented times 3; Non-focal. Psych: Mood and affect normal.   Data Review:    Labs: Basic Metabolic Panel:  Recent Labs Lab 12/08/15 0922  NA 137  K 4.0  CL 97*  CO2 29  GLUCOSE 173*  BUN 22*  CREATININE 1.31*  CALCIUM 9.0   Liver Function Tests:  Recent Labs Lab 12/08/15 0922  AST 28  ALT 12*  ALKPHOS 117  BILITOT 1.5*  PROT 8.1  ALBUMIN 3.5   CBC:  Recent Labs Lab 12/08/15 0922  WBC 5.5  NEUTROABS 3.9  HGB 13.7  HCT 43.5  MCV 94.2  PLT 275    Radiographic Studies: Dg Chest Port 1 View  12/08/2015  CLINICAL DATA:  Generalized pain and pressure for about 2 weeks EXAM: PORTABLE CHEST 1 VIEW COMPARISON:  06/09/15 FINDINGS: Cardiomegaly is noted. Status post CABG. Central vascular congestion and mild interstitial prominence suspicious for mild interstitial edema. There is left basilar atelectasis or infiltrate. Limited study by patient's large body habitus. IMPRESSION: Central vascular congestion and mild interstitial prominence suspicious for mild interstitial edema. There is left basilar atelectasis or infiltrate. Electronically Signed   By: Lahoma Crocker M.D.   On: 12/08/2015 09:23   *I have personally reviewed the images above*  EKG: Independently reviewed. Sinus rhythm at 64 bpm (but HR dropping down into 30's on tele); Multiform ventricular premature complexes; Right axis deviation' Low voltage, extremity and precordial leads; Probable anteroseptal infarct, old (Q waves in V1 & V2).   Assessment/Plan:    Principal Problem:   Acute on chronic combined systolic and diastolic CHF (congestive heart failure) (HCC) - Admit to tele.  CHF order set utilized. - Repeat 2 D Echo given decompensation without an obvious cause. - Continue ASA. - CXR shows: interstitial edema.  BNP: 1155/5. - Evaluate for ACS with EKG/serial troponins. - EKG personally reviewed and shows no evidence of ischemia, infarction, arrhythmia or left ventricular hypertrophy. - Last 2 D Echocardiogram done 03/15/15 and showed an EF of 92-33%, grade 3 diastolic dysfunction. - Provide supplemental oxygen to maintain oxygen saturations of >90%. - Briefly received NTG IV in ED, then a dose of nitropaste. - Start lasix at 80 mg Q 6 hours.  (Home regimen: Torsemide 40 mg BID and Lasix 80 mg BID) - Start Hydralazine. - No ACE/ARB due to h/o allergy to Lisinopril.  Active Problems:   Obesity, morbid-BMI 45 - Dietician consult.    Hyperlipidemia LDL goal <70 - Was treated with Lipitor, but no longer taking. Check fasting lipid panel in the morning.    Type 2 diabetes, uncontrolled, with neuropathy ([place]) - Continue Glucotrol and Janumet. - Check hemoglobin A1c. - Start SSI Q AC/HS, sensitive scale.    Hx of CABG 2012/Ischemic cardiomyopathy-30-35% May 2016 - Continue ASA.  Hold Coreg given bradycardia. Although Plavix on med list, no longer taking this.    Pulmonary hypertension ([place]) - Supplemental oxygen ordered.    Hypothyroidism - Check TSH, not on synthroid.    Painful diabetic neuropathy ([place]) - Not currently on medication.    AKI (acute kidney injury) ([place]) - Monitor renal function.  Baseline creatinine 0.8.    Bradycardia - Monitor on tele.  Hold Coreg.    DVT prophylaxis - Lovenox ordered.  Code Status / Family Communication / Disposition Plan:   Code Status: Full. Family Communication: No family at the bedside. Disposition Plan: Home when stable. Anticipate a 3 day hospital stay.  Attestation  regarding necessity of inpatient status:   The appropriate admission status for this patient is INPATIENT. Inpatient status is judged to be reasonable and necessary in order to provide the  required intensity of service to ensure the patient's safety. The patient's presenting symptoms, physical exam findings, and initial radiographic and laboratory data in the context of their chronic comorbidities is felt to place them at high risk for further clinical deterioration. Furthermore, it is not anticipated that the patient will be medically stable for discharge from the hospital within 2 midnights of admission. The following factors support the admission status of inpatient.   -The patient's presenting symptoms include dyspnea, lightheadedness, chest pressure. - The worrisome physical exam findings include increased WOB, crackles, 3+ LE edema, morbid obesity. - The initial radiographic and laboratory data are worrisome because of CXR findings of interstitial edema and elevated BNP, elevated creatinine. - The chronic co-morbidities include DM, morbid obesity, ischemic cardiomyopathy, chronic systolic and diastolic CHF. - Patient requires inpatient status due to high intensity of service, high risk for further deterioration and high frequency of surveillance required. - I certify that at the point of admission it is my clinical judgment that the patient will require inpatient hospital care spanning beyond 2 midnights from the point of admission.   Time spent: 70 minutes.  Robet Crutchfield [facility] Pager [phone] Cell: [phone]   If 7PM-7AM, please contact night-coverage [email or web address] Password [facility] 12/08/2015, 12:25 PM

## 2015-12-08 NOTE — ED Notes (Signed)
Pt with hx of CHF.  Pt has had increased shortness of breath x 2 weeks.  Leg swelling. Also has cough.  No fever.

## 2015-12-09 ENCOUNTER — Inpatient Hospital Stay (HOSPITAL_COMMUNITY): Payer: Medicaid Other

## 2015-12-09 DIAGNOSIS — I509 Heart failure, unspecified: Secondary | ICD-10-CM

## 2015-12-09 DIAGNOSIS — R601 Generalized edema: Secondary | ICD-10-CM

## 2015-12-09 LAB — BASIC METABOLIC PANEL
Anion gap: 10 (ref 5–15)
BUN: 22 mg/dL — AB (ref 6–20)
CHLORIDE: 98 mmol/L — AB (ref 101–111)
CO2: 32 mmol/L (ref 22–32)
CREATININE: 1.3 mg/dL — AB (ref 0.44–1.00)
Calcium: 8.8 mg/dL — ABNORMAL LOW (ref 8.9–10.3)
GFR calc Af Amer: 49 mL/min — ABNORMAL LOW (ref >60)
GFR calc non Af Amer: 42 mL/min — ABNORMAL LOW (ref >60)
Glucose, Bld: 105 mg/dL — ABNORMAL HIGH (ref 65–99)
POTASSIUM: 4 mmol/L (ref 3.5–5.1)
Sodium: 140 mmol/L (ref 135–145)

## 2015-12-09 LAB — GLUCOSE, CAPILLARY
GLUCOSE-CAPILLARY: 87 mg/dL (ref 65–99)
GLUCOSE-CAPILLARY: 86 mg/dL (ref 65–99)
GLUCOSE-CAPILLARY: 80 mg/dL (ref 65–99)
GLUCOSE-CAPILLARY: 185 mg/dL — AB (ref 65–99)
Glucose-Capillary: 64 mg/dL — ABNORMAL LOW (ref 65–99)

## 2015-12-09 LAB — TROPONIN I: Troponin I: 0.03 ng/mL (ref <0.031)

## 2015-12-09 LAB — LIPID PANEL
CHOL/HDL RATIO: 5.2 ratio
Cholesterol: 136 mg/dL (ref 0–200)
HDL: 26 mg/dL — ABNORMAL LOW (ref >40)
LDL CALC: 94 mg/dL (ref 0–99)
TRIGLYCERIDES: 82 mg/dL (ref <150)
VLDL: 16 mg/dL (ref 0–40)

## 2015-12-09 MED ORDER — METOLAZONE 5 MG PO TABS: 5.0000 mg | ORAL_TABLET | Freq: Two times a day (BID) | ORAL | Status: DC

## 2015-12-09 MED ORDER — FUROSEMIDE 10 MG/ML IJ SOLN
160.0000 mg | Freq: Two times a day (BID) | INTRAVENOUS | Status: DC
  Filled 2015-12-09: qty 16

## 2015-12-09 MED ORDER — PERFLUTREN LIPID MICROSPHERE
1.0000 mL | INTRAVENOUS | Status: AC | PRN
  Administered 2015-12-09: 1.3 mL via INTRAVENOUS
  Filled 2015-12-09: qty 10

## 2015-12-09 MED ORDER — METOLAZONE 2.5 MG PO TABS
2.5000 mg | ORAL_TABLET | Freq: Two times a day (BID) | ORAL | Status: DC
  Administered 2015-12-09 – 2015-12-14 (×11): 2.5 mg via ORAL
  Filled 2015-12-09 (×13): qty 1

## 2015-12-09 MED ORDER — FUROSEMIDE 10 MG/ML IJ SOLN
160.0000 mg | Freq: Two times a day (BID) | INTRAVENOUS | Status: DC
  Administered 2015-12-09 – 2015-12-12 (×7): 160 mg via INTRAVENOUS
  Filled 2015-12-09 (×3): qty 16
  Filled 2015-12-09: qty 10
  Filled 2015-12-09: qty 16
  Filled 2015-12-09: qty 12
  Filled 2015-12-09 (×3): qty 10

## 2015-12-09 NOTE — Progress Notes (Signed)
Echocardiogram 2D Echocardiogram has been performed.  Christine Knight 12/09/2015, 1:45 PM

## 2015-12-09 NOTE — Progress Notes (Signed)
2D echo result reviewed. Shows further decrease in EF of 15-20% and diffuse hypokinesis. Severe TR with decrease RV function.  continue diuresis. Will consult cardiology in am.

## 2015-12-09 NOTE — Progress Notes (Signed)
Notified physician that central telemetry called to report pt had 5 beats Vtach.

## 2015-12-09 NOTE — Progress Notes (Signed)
[facility] PROGRESS NOTE  Christine Knight ZOX:[id number] DOB: [birth year] DOA: 12/08/2015 PCP: Sanda Linger, MD  Brief narrative 65 year old morbidly obese female wit history of systolic and diastolic CHF (EF of 30-35% with grade 2 diastolic dysfunction from last echo in 03/15/2015, followed with Dr. Myrtis Ser), diabetes mellitus on metformin, CAD with history of NST MI and subsequent CABG in 2012, pulmonary hypertension, hyperlipidemia who presented to the ED with 2 weeks history of progressive shortness of breath associated with orthopnea and significant leg swellings. She has almost 40 pound weight gain. She reports that she has has not taken her diuretics for the past 3-4 days as she wasn't able to afford them. Denies any recent illness or travel. Patient admitted for acute on chronic CHF with anasarca.  Assessment/Plan: Acute on chronic systolic and diastolic CHF with anasarca Patient started on IV Lasix 80 every 6 hours. Urine output has not been clinically monitored. Needs strict I/O and daily weight. Patient is 40 pounds above her baseline weight. I would switch to Lasix GTT (  q12 hr) . Add metolazone 2.5 mg twice a day. Check daily lites and renal function and replenish low potassium. Symptoms improved consult cardiology. Continue BiDil. Coreg held due to acute decompensation and bradycardia to the 40s.. Not on ACE inhibitor due to allergy. Unclear why she is not on ARB. Check lipid panel. I will order repeat 2-D echo.  Type 2 diabetes mellitus with hypoglycemia FSG in the 60s this morning. Will hold her metformin, Tradjenta and glipizide. Check A1c. Monitor on sliding scale coverage alone.  Coronary artery disease with history of CABG Continue aspirin and statin. Holding beta blocker due to acute decompensated CHF and patient bradycardic to the 40s on admission..  Sinus bradycardia Stable on telemetry. Holding Coreg.  Acute kidney injury Possibly cardiorenal. Monitor with  aggressive diuresis.  Hypothyroidism Not on any medications. TSH normal.   DVT prophylaxis: Subcutaneous Lovenox Diet: Heart healthy with fluid restriction.   Code Status: Full code Family Communication: Daughter at bedside Disposition Plan: Home once clinically improved.   Consultants:  None  Procedures:  None  Antibiotics:  None  HPI/Subjective: Patient reports that her shortness of breath has not improved much from admission.  Objective: Filed Vitals:   12/08/15 2215 12/09/15 0500  BP: 106/77 109/73  Pulse: 62 91  Temp: 98.1 F (36.7 C) 98.8 F (37.1 C)  Resp: 20 22    Intake/Output Summary (Last 24 hours) at 12/09/15 1244 Last data filed at 12/09/15 1203  Gross per 24 hour  Intake    720 ml  Output    800 ml  Net    -80 ml   Filed Weights   12/08/15 1339  Weight: 140.161 kg (309 lb)    Exam:   General:  Elderly obese female not in distress  HEENT:JVD++, moist mucosa  Chest: Diminished bilateral breath sounds  CVS: Normal S1 and S2, no murmurs  GI: Pitting abdominal wall edema, nontender, nondistended  Musculoskeletal:3+ pitting edema bilaterally  CNS: Alert and oriented  Data Reviewed: Basic Metabolic Panel:  Recent Labs Lab 12/08/15 0922 12/09/15 0346  NA 137 140  K 4.0 4.0  CL 97* 98*  CO2 29 32  GLUCOSE 173* 105*  BUN 22* 22*  CREATININE 1.31* 1.30*  CALCIUM 9.0 8.8*   Liver Function Tests:  Recent Labs Lab 12/08/15 0922  AST 28  ALT 12*  ALKPHOS 117  BILITOT 1.5*  PROT 8.1  ALBUMIN 3.5   No results for input(s): LIPASE, AMYLASE  in the last 168 hours. No results for input(s): AMMONIA in the last 168 hours. CBC:  Recent Labs Lab 12/08/15 0922  WBC 5.5  NEUTROABS 3.9  HGB 13.7  HCT 43.5  MCV 94.2  PLT 275   Cardiac Enzymes:  Recent Labs Lab 12/08/15 1535 12/08/15 2200 12/09/15 0340  TROPONINI 0.03 0.03 0.03   BNP (last 3 results)  Recent Labs  05/29/15 1608 06/04/15 0945 12/08/15 0922   BNP 921.7* 340.1* 1155.5*    ProBNP (last 3 results) No results for input(s): PROBNP in the last 8760 hours.  CBG:  Recent Labs Lab 12/08/15 1632 12/08/15 2211 12/09/15 0733 12/09/15 0825  GLUCAP 112* 137* 64* 87    No results found for this or any previous visit (from the past 240 hour(s)).   Studies: Dg Chest Port 1 View  12/08/2015  CLINICAL DATA:  Generalized pain and pressure for about 2 weeks EXAM: PORTABLE CHEST 1 VIEW COMPARISON:  06/09/15 FINDINGS: Cardiomegaly is noted. Status post CABG. Central vascular congestion and mild interstitial prominence suspicious for mild interstitial edema. There is left basilar atelectasis or infiltrate. Limited study by patient's large body habitus. IMPRESSION: Central vascular congestion and mild interstitial prominence suspicious for mild interstitial edema. There is left basilar atelectasis or infiltrate. Electronically Signed   By: Natasha Mead M.D.   On: 12/08/2015 09:23    Scheduled Meds: . aspirin  81 mg Oral Daily  . enoxaparin (LOVENOX) injection  40 mg Subcutaneous Q24H  . furosemide  160 mg Intravenous BID  . insulin aspart  0-5 Units Subcutaneous QHS  . insulin aspart  0-9 Units Subcutaneous TID WC  . isosorbide-hydrALAZINE  1 tablet Oral BID  . multivitamin with minerals  1 tablet Oral Daily  . potassium chloride  20 mEq Oral BID  . sodium chloride flush  3 mL Intravenous Q12H  . sodium chloride flush  3 mL Intravenous Q12H   Continuous Infusions:   Principal Problem:   Acute on chronic combined systolic and diastolic CHF (congestive heart failure) (HCC) Active Problems:   Obesity, morbid-BMI 45   Hyperlipidemia LDL goal <70   Type 2 diabetes, uncontrolled, with neuropathy (HCC)   Hx of CABG 2012   Ischemic cardiomyopathy-30-35% May 2016   Pulmonary hypertension (HCC)   Hypothyroidism   Painful diabetic neuropathy (HCC)   AKI (acute kidney injury) (HCC)   Bradycardia    Time spent: 25 minutes    Christine Knight,  Christine Knight  [facility] Pager [phone] If 7PM-7AM, please contact night-coverage at [email or web address], password [facility] 12/09/2015, 12:44 PM  LOS: 1 day

## 2015-12-10 DIAGNOSIS — I255 Ischemic cardiomyopathy: Secondary | ICD-10-CM

## 2015-12-10 DIAGNOSIS — R001 Bradycardia, unspecified: Secondary | ICD-10-CM

## 2015-12-10 DIAGNOSIS — I5043 Acute on chronic combined systolic (congestive) and diastolic (congestive) heart failure: Secondary | ICD-10-CM

## 2015-12-10 DIAGNOSIS — E785 Hyperlipidemia, unspecified: Secondary | ICD-10-CM

## 2015-12-10 DIAGNOSIS — E1140 Type 2 diabetes mellitus with diabetic neuropathy, unspecified: Secondary | ICD-10-CM

## 2015-12-10 DIAGNOSIS — Z951 Presence of aortocoronary bypass graft: Secondary | ICD-10-CM

## 2015-12-10 DIAGNOSIS — N179 Acute kidney failure, unspecified: Secondary | ICD-10-CM

## 2015-12-10 DIAGNOSIS — E1165 Type 2 diabetes mellitus with hyperglycemia: Secondary | ICD-10-CM

## 2015-12-10 DIAGNOSIS — I272 Other secondary pulmonary hypertension: Secondary | ICD-10-CM

## 2015-12-10 LAB — BASIC METABOLIC PANEL
ANION GAP: 10 (ref 5–15)
BUN: 23 mg/dL — ABNORMAL HIGH (ref 6–20)
CO2: 31 mmol/L (ref 22–32)
Calcium: 8.7 mg/dL — ABNORMAL LOW (ref 8.9–10.3)
Chloride: 97 mmol/L — ABNORMAL LOW (ref 101–111)
Creatinine, Ser: 1.42 mg/dL — ABNORMAL HIGH (ref 0.44–1.00)
GFR, EST AFRICAN AMERICAN: 44 mL/min — AB (ref >60)
GFR, EST NON AFRICAN AMERICAN: 38 mL/min — AB (ref >60)
GLUCOSE: 79 mg/dL (ref 65–99)
POTASSIUM: 3.6 mmol/L (ref 3.5–5.1)
Sodium: 138 mmol/L (ref 135–145)

## 2015-12-10 LAB — MAGNESIUM: Magnesium: 2.3 mg/dL (ref 1.7–2.4)

## 2015-12-10 LAB — GLUCOSE, CAPILLARY
GLUCOSE-CAPILLARY: 80 mg/dL (ref 65–99)
GLUCOSE-CAPILLARY: 95 mg/dL (ref 65–99)
GLUCOSE-CAPILLARY: 128 mg/dL — AB (ref 65–99)
Glucose-Capillary: 161 mg/dL — ABNORMAL HIGH (ref 65–99)

## 2015-12-10 MED ORDER — CARVEDILOL 3.125 MG PO TABS
3.1250 mg | ORAL_TABLET | Freq: Two times a day (BID) | ORAL | Status: DC
  Administered 2015-12-10 – 2015-12-17 (×14): 3.125 mg via ORAL
  Filled 2015-12-10 (×16): qty 1

## 2015-12-10 NOTE — Plan of Care (Signed)
Problem: Food- and Nutrition-Related Knowledge Deficit (NB-1.1) Goal: Nutrition education Formal process to instruct or train a patient/client in a skill or to impart knowledge to help patients/clients voluntarily manage or modify food choices and eating behavior to maintain or improve health. Outcome: Completed/Met Date Met:  12/10/15 Nutrition Education Note  RD consulted for nutrition education regarding morbid obesity and recent weight gain of 40 lb.  RD provided "Low Sodium Nutrition Therapy" handout from the Academy of [employer]etics. Reviewed patient's dietary recall. Provided examples on ways to decrease sodium intake in diet. Discouraged intake of processed foods and use of salt shaker. Encouraged fresh fruits and vegetables as well as whole grain sources of carbohydrates to maximize fiber intake.   Provided "My Plate" handout. Reviewed carbohydrate foods and portion sizes with patient. Teach back method used.  Expect fair compliance. Pt reports using sea salt because "it is lower in the sodium". Explained to pt that sea salt has the same amount of sodium as regular table salt. Pt reports poor appetite and inconsistent intakes.  Body mass index is 48.32 kg/(m^2). Pt meets criteria for morbid obesity based on current BMI.  Current diet order is Heart Healthy/CHO modified with 1500 ml fluid restriction , patient is consuming approximately 75-85% of meals at this time. Labs and medications reviewed. No further nutrition interventions warranted at this time. RD contact information provided. If additional nutrition issues arise, please re-consult RD.   Clayton Bibles, MS, RD, LDN Pager: [phone] After Hours Pager: [phone]

## 2015-12-10 NOTE — Consult Note (Signed)
CONSULTATION NOTE  Reason for Consult: CHF  Requesting Physician: Dr. Clementeen Graham  Cardiologist: Dr. Ron Parker  CC: "I'm having trouble breathing"  HPI: This is a 65 y.o. female with a past medical history significant for chronic systolic congestive heart failure, morbid obesity, type 2 diabetes, dyslipidemia, recent NSTEMI in July 2016 and prior CABG x 4 in 2012 with LIMA to LAD, SVG to diagonal, SVG to OM and SVG to PDA. I saw her along with Dr. Ron Parker this past summer in the hospital for congestive heart failure and NSTEMI. We had recommended cardiac catheterization at that time but she adamantly refused. She was diuresed with improvement in her symptoms. Her discharge rate was 121 kg. She is now admitted with a two-week history of progressive shortness of breath, lower extremity edema and proximally 40 pound weight gain (admission weight is 140 kg). She reports that she's been compliant with her diuretics and then for the past several days she felt a dose of her torsemide without any improvement in her symptoms. Eyes any dietary indiscretion or changes. She also had some progressive chest tightness 2 days ago as well. BMP admission was 1155. Troponin is negative. She was started on 4 times a day Lasix and her creatinine is rising today. That recorded diuresis about 1.6 L. A repeat echocardiogram was performed yesterday and demonstrated further decline in LV function to EF of 15-20% from 30-35% in May 2016. There is severe RV dysfunction as well, however peak PA pressures 81 mmHg. Cardiology is asked to consult regarding congestive heart failure.  PMHx:  Past Medical History  Diagnosis Date  . Obesity, morbid ([place])   . Chronic systolic heart failure (HCC)     Chronic systolic CHF  . Hyperlipidemia 08/10/11  . Diabetes mellitus type 2 in obese ([place]) 08/10/11  . CAD (coronary artery disease) 08/2011    NSTEMI with subsequent CABG 08/13/2011 with LIMA-LAD, SVG-diagonal, SVG-OM, SVG-PDA  . Ischemic  cardiomyopathy 08/13/2011    EF 20% in 08/2011, still 25% 10/21/2011  . Pleural effusion     Requiring L thoracentesis 09/02/11  . Mitral regurgitation     Mild by echo 10/21/11  . Hypertension   . Pulmonary hypertension ([place])     Echo, September, 2013, 72 mmHg.  Marland Kitchen Ejection fraction < 50%     EF 20%, October, 2012  //  EF 25% December, 2012  //   EF 35%, echo, September, 2013  . Myocardial infarction ([place]) 08/2011  . Orthopnoea     "progressively worse over last 3 wks" (03/17/2013)  . CHF (congestive heart failure) ([place])   . Carpal tunnel syndrome on both sides     "post OHS in 08/2011; resolved now" (03/17/2013)   Past Surgical History  Procedure Laterality Date  . Laparoscopy  1980's?    "removed gallstones; not gallbladder" (03/17/2013)  . Coronary artery bypass graft  08/13/11    CABG x4 with LIMA to LAD, SVG to Diag, SVG to OM, SVG to PDA, EVH via both thighs    FAMHx: Family History  Problem Relation Age of Onset  . Heart disease Father   . Cancer Neg Hx   . Diabetes Neg Hx   . Kidney disease Neg Hx   . Hypertension Maternal Grandmother   . Hypertension Sister     SOCHx:  reports that she quit smoking about 37 years ago. Her smoking use included Cigarettes. She has never used smokeless tobacco. She reports that she drinks alcohol. She reports that she  does not use illicit drugs.  ALLERGIES: Allergies  Allergen Reactions  . Lisinopril Cough    cough    ROS: Pertinent items noted in HPI and remainder of comprehensive ROS otherwise negative.  HOME MEDICATIONS:   Medication List    ASK your doctor about these medications        acetaminophen 325 MG tablet  Commonly known as:  TYLENOL  Take 650 mg by mouth every 6 (six) hours as needed for mild pain.     aspirin EC 81 MG tablet  Take 1 tablet (81 mg total) by mouth daily.     atorvastatin 10 MG tablet  Commonly known as:  LIPITOR  TAKE 1 TABLET BY MOUTH DAILY     carvedilol 25 MG tablet  Commonly known as:   COREG  TAKE 1 TABLET BY MOUTH TWICE DAILY     clopidogrel 75 MG tablet  Commonly known as:  PLAVIX  Take 1 tablet (75 mg total) by mouth daily.     furosemide 80 MG tablet  Commonly known as:  LASIX  Take 1 tablet (80 mg total) by mouth 2 (two) times daily.     glipiZIDE 10 MG tablet  Commonly known as:  GLUCOTROL  Take 10 mg by mouth 2 (two) times daily.     glucose blood test strip  Use TID     multivitamin with minerals Tabs tablet  Take 1 tablet by mouth daily.     ONETOUCH VERIO w/Device Kit  1 Act by Does not apply route 3 (three) times daily.     sitaGLIPtin-metformin 50-1000 MG tablet  Commonly known as:  JANUMET  Take 1 tablet by mouth 2 (two) times daily with a meal.     torsemide 20 MG tablet  Commonly known as:  DEMADEX  Take 40 mg by mouth 2 (two) times daily.        HOSPITAL MEDICATIONS: I have reviewed the patient's current medications.  VITALS: Blood pressure 107/56, pulse 65, temperature 98.4 F (36.9 C), temperature source Oral, resp. rate 20, weight 308 lb 9.6 oz (139.98 kg), SpO2 97 %.  PHYSICAL EXAM: General appearance: alert, mild distress and morbidly obese Neck: JVD - several cm above sternal notch and no carotid bruit Lungs: diminished breath sounds bibasilar Heart: regular rate and rhythm and systolic murmur: holosystolic 3/6, blowing at lower left sternal border Abdomen: protuberant Extremities: edema 3-4+ Pulses: faint distal pulses Skin: Skin color, texture, turgor normal. No rashes or lesions Neurologic: Grossly normal Psych: Pleasant  LABS: Results for orders placed or performed during the hospital encounter of 12/08/15 (from the past 48 hour(s))  Troponin I     Status: None   Collection Time: 12/08/15  3:35 PM  Result Value Ref Range   Troponin I 0.03 <0.031 ng/mL    Comment:        NO INDICATION OF MYOCARDIAL INJURY.   TSH     Status: None   Collection Time: 12/08/15  3:36 PM  Result Value Ref Range   TSH 3.253 0.350  - 4.500 uIU/mL  Glucose, capillary     Status: Abnormal   Collection Time: 12/08/15  4:32 PM  Result Value Ref Range   Glucose-Capillary 112 (H) 65 - 99 mg/dL  Troponin I     Status: None   Collection Time: 12/08/15 10:00 PM  Result Value Ref Range   Troponin I 0.03 <0.031 ng/mL    Comment:        NO INDICATION OF MYOCARDIAL  INJURY.   Glucose, capillary     Status: Abnormal   Collection Time: 12/08/15 10:11 PM  Result Value Ref Range   Glucose-Capillary 137 (H) 65 - 99 mg/dL   Comment 1 Notify RN   Troponin I     Status: None   Collection Time: 12/09/15  3:40 AM  Result Value Ref Range   Troponin I 0.03 <0.031 ng/mL    Comment:        NO INDICATION OF MYOCARDIAL INJURY.   Lipid panel     Status: Abnormal   Collection Time: 12/09/15  3:40 AM  Result Value Ref Range   Cholesterol 136 0 - 200 mg/dL   Triglycerides 82 <150 mg/dL   HDL 26 (L) >40 mg/dL   Total CHOL/HDL Ratio 5.2 RATIO   VLDL 16 0 - 40 mg/dL   LDL Cholesterol 94 0 - 99 mg/dL    Comment:        Total Cholesterol/HDL:CHD Risk Coronary Heart Disease Risk Table                     Men   Women  1/2 Average Risk   3.4   3.3  Average Risk       5.0   4.4  2 X Average Risk   9.6   7.1  3 X Average Risk  23.4   11.0        Use the calculated Patient Ratio above and the CHD Risk Table to determine the patient's CHD Risk.        ATP III CLASSIFICATION (LDL):  <100     mg/dL   Optimal  100-129  mg/dL   Near or Above                    Optimal  130-159  mg/dL   Borderline  160-189  mg/dL   High  >190     mg/dL   Very High Performed at [facility] metabolic panel     Status: Abnormal   Collection Time: 12/09/15  3:46 AM  Result Value Ref Range   Sodium 140 135 - 145 mmol/L   Potassium 4.0 3.5 - 5.1 mmol/L   Chloride 98 (L) 101 - 111 mmol/L   CO2 32 22 - 32 mmol/L   Glucose, Bld 105 (H) 65 - 99 mg/dL   BUN 22 (H) 6 - 20 mg/dL   Creatinine, Ser 1.30 (H) 0.44 - 1.00 mg/dL   Calcium 8.8 (L)  8.9 - 10.3 mg/dL   GFR calc non Af Amer 42 (L) >60 mL/min   GFR calc Af Amer 49 (L) >60 mL/min    Comment: (NOTE) The eGFR has been calculated using the CKD EPI equation. This calculation has not been validated in all clinical situations. eGFR's persistently <60 mL/min signify possible Chronic Kidney Disease.    Anion gap 10 5 - 15  Glucose, capillary     Status: Abnormal   Collection Time: 12/09/15  7:33 AM  Result Value Ref Range   Glucose-Capillary 64 (L) 65 - 99 mg/dL  Glucose, capillary     Status: None   Collection Time: 12/09/15  8:25 AM  Result Value Ref Range   Glucose-Capillary 87 65 - 99 mg/dL  Glucose, capillary     Status: None   Collection Time: 12/09/15 12:54 PM  Result Value Ref Range   Glucose-Capillary 86 65 - 99 mg/dL  Glucose, capillary  Status: None   Collection Time: 12/09/15  4:52 PM  Result Value Ref Range   Glucose-Capillary 80 65 - 99 mg/dL  Glucose, capillary     Status: Abnormal   Collection Time: 12/09/15  9:54 PM  Result Value Ref Range   Glucose-Capillary 185 (H) 65 - 99 mg/dL  Basic metabolic panel     Status: Abnormal   Collection Time: 12/10/15  5:20 AM  Result Value Ref Range   Sodium 138 135 - 145 mmol/L   Potassium 3.6 3.5 - 5.1 mmol/L   Chloride 97 (L) 101 - 111 mmol/L   CO2 31 22 - 32 mmol/L   Glucose, Bld 79 65 - 99 mg/dL   BUN 23 (H) 6 - 20 mg/dL   Creatinine, Ser 1.42 (H) 0.44 - 1.00 mg/dL   Calcium 8.7 (L) 8.9 - 10.3 mg/dL   GFR calc non Af Amer 38 (L) >60 mL/min   GFR calc Af Amer 44 (L) >60 mL/min    Comment: (NOTE) The eGFR has been calculated using the CKD EPI equation. This calculation has not been validated in all clinical situations. eGFR's persistently <60 mL/min signify possible Chronic Kidney Disease.    Anion gap 10 5 - 15  Magnesium     Status: None   Collection Time: 12/10/15  5:20 AM  Result Value Ref Range   Magnesium 2.3 1.7 - 2.4 mg/dL  Glucose, capillary     Status: None   Collection Time: 12/10/15   7:50 AM  Result Value Ref Range   Glucose-Capillary 80 65 - 99 mg/dL    IMAGING: No results found.  HOSPITAL DIAGNOSES: Principal Problem:   Acute on chronic combined systolic and diastolic CHF (congestive heart failure) (HCC) Active Problems:   Obesity, morbid-BMI 45   Hyperlipidemia LDL goal <70   Type 2 diabetes, uncontrolled, with neuropathy ([place])   Hx of CABG 2012   Ischemic cardiomyopathy-30-35% May 2016   Pulmonary hypertension (HCC)   Hypothyroidism   Painful diabetic neuropathy (HCC)   AKI (acute kidney injury) ([place])   Bradycardia   IMPRESSION: 1. Acute on chronic congestive systolic heart failure, NYHA class IV - EF now 15-20% 2. Morbid obesity, 3. Uncontrolled type 2 diabetes 4. Prior CABG 4 2012 5. Recent NSTEMI in July 2016 - refused cath at that time 6. NSVT-secondary to #1  RECOMMENDATION: 1. Mrs. Cannedy has gained about 40 pounds since she was diuresed this past summer. She is anasarca take in significantly short of breath. She reported that she did run out of her Lasix for a few days but had been compliant with her medicine. I suspect this represents worsening cardiomyopathy. She does have a history of non-ST elevation MI last summer and refused cardiac catheterization at that time. I suspect she has lost bypass graft sores developed progressively worsening ischemic cardiomyopathy as the cause of her worsening congestive heart failure. I again strongly recommended a repeat heart catheterization. She initially seemed agreeable but then said she had to think about it after explaining it. I agree with her current treatment and would recommend continued IV diuresis with Lasix drip. She will likely need several days of diuresis before considering catheterization. She is having some NSVT. Although she is decompensated, I think she could tolerate low-dose carvedilol 3.125 mg twice a day. Follow blood pressure closely as she is borderline hypertensive.  Cardiology will  follow with you. Thanks again for the consult.  Time Spent Directly with Patient: 45 minutes  Pixie Casino, MD,  [facility] Attending Cardiologist [place] C John Vasconcelos 12/10/2015, 9:36 AM

## 2015-12-10 NOTE — Progress Notes (Signed)
[facility] PROGRESS NOTE  Christine Knight ZOX:[id number] DOB: 12-12-1950 DOA: 12/08/2015 PCP: Sanda Linger, MD  Brief narrative 65 year old morbidly obese female wit history of systolic and diastolic CHF (EF of 30-35% with grade 2 diastolic dysfunction from last echo in 03/15/2015, followed with Dr. Myrtis Ser), diabetes mellitus on metformin, CAD with history of NSTE MI and subsequent CABG in 2012, pulmonary hypertension, hyperlipidemia who presented to the ED with 2 weeks history of progressive shortness of breath associated with orthopnea and significant leg swellings. She has almost 40 pound weight gain. She reports that she has has not taken her diuretics for the past 3-4 days as she wasn't able to afford them. Denies any recent illness or travel. Patient admitted for acute on chronic CHF with anasarca.  Assessment/Plan: Acute on chronic systolic and diastolic CHF with anasarca Patient started on IV Lasix 80 every 6 hours. Urine output has not been clinically monitored. Needs strict I/O and daily weight. Patient is 40 pounds above her baseline weight. Switched to Lasix GTT (  q12 hr) . Added metolazone 2.5 mg twice a day. Needs aggressive diuresis. (1.8 L negative since admission and symptoms slightly better) 2-D echo shows worsened EF of 15-20% with diffuse hypokinesis. Patient was admitted with NST MI in 2016 but refused cardiac cath and sent home on medical management. -Seen by cardiology and recommends left heart catheterization once symptoms better with diuresis (for now she is not able to lay down flat) . Patient undecided at this time and once another day to think about it. -Monitor lites closely. Continue daily potassium. -Resumed low-dose Coreg. Continue BiDil.    Type 2 diabetes mellitus with hypoglycemia Home medications due to low FSG. Check A1c. Monitor on sliding scale coverage.  Coronary artery disease with history of CABG Continue aspirin and statin. Resume low-dose  Coreg as heart rate is stable  Sinus bradycardia Stable on telemetry. Resume low-dose Coreg.  NSVT and PVCs Potassium and magnesium normal. Added low dose Coreg  Acute kidney injury Possibly cardiorenal. Monitor with aggressive diuresis.  Hypothyroidism Not on any medications. TSH normal.   DVT prophylaxis: Subcutaneous Lovenox Diet: Heart healthy with fluid restriction.     Code Status: Full code Family Communication: Daughter at bedside Disposition Plan: Home once clinically improved.   Consultants:  Cardiology  Procedures:  None  Antibiotics:  None  HPI/Subjective: Patient reports that her shortness of breath has not improved much from admission.  Objective: Filed Vitals:   12/09/15 2206 12/10/15 0500  BP: 108/74 107/56  Pulse: 72 65  Temp: 99.1 F (37.3 C) 98.4 F (36.9 C)  Resp: 22 20    Intake/Output Summary (Last 24 hours) at 12/10/15 1318 Last data filed at 12/10/15 1131  Gross per 24 hour  Intake    240 ml  Output   2000 ml  Net  -1760 ml   Filed Weights   12/08/15 1339 12/10/15 0700  Weight: 140.161 kg (309 lb) 139.98 kg (308 lb 9.6 oz)    Exam:   General:  Elderly obese female not in distress  HEENT:JVD++, moist mucosa  Chest: Diminished bilateral breath sounds  CVS: Normal S1 and S2, no murmurs  GI: Pitting abdominal wall edema (improved since admission), nontender, nondistended  Musculoskeletal:2+ pitting edema bilaterally  CNS: Alert and oriented  Data Reviewed: Basic Metabolic Panel:  Recent Labs Lab 12/08/15 0922 12/09/15 0346 12/10/15 0520  NA 137 140 138  K 4.0 4.0 3.6  CL 97* 98* 97*  CO2 29 32 31  GLUCOSE  173* 105* 79  BUN 22* 22* 23*  CREATININE 1.31* 1.30* 1.42*  CALCIUM 9.0 8.8* 8.7*  MG  --   --  2.3   Liver Function Tests:  Recent Labs Lab 12/08/15 0922  AST 28  ALT 12*  ALKPHOS 117  BILITOT 1.5*  PROT 8.1  ALBUMIN 3.5   No results for input(s): LIPASE, AMYLASE in the last 168  hours. No results for input(s): AMMONIA in the last 168 hours. CBC:  Recent Labs Lab 12/08/15 0922  WBC 5.5  NEUTROABS 3.9  HGB 13.7  HCT 43.5  MCV 94.2  PLT 275   Cardiac Enzymes:  Recent Labs Lab 12/08/15 1535 12/08/15 2200 12/09/15 0340  TROPONINI 0.03 0.03 0.03   BNP (last 3 results)  Recent Labs  05/29/15 1608 06/04/15 0945 12/08/15 0922  BNP 921.7* 340.1* 1155.5*    ProBNP (last 3 results) No results for input(s): PROBNP in the last 8760 hours.  CBG:  Recent Labs Lab 12/09/15 0825 12/09/15 1254 12/09/15 1652 12/09/15 2154 12/10/15 0750  GLUCAP 87 86 80 185* 80    No results found for this or any previous visit (from the past 240 hour(s)).   Studies: No results found.  Scheduled Meds: . aspirin  81 mg Oral Daily  . carvedilol  3.125 mg Oral BID WC  . enoxaparin (LOVENOX) injection  40 mg Subcutaneous Q24H  . furosemide  160 mg Intravenous BID  . insulin aspart  0-5 Units Subcutaneous QHS  . insulin aspart  0-9 Units Subcutaneous TID WC  . isosorbide-hydrALAZINE  1 tablet Oral BID  . metolazone  2.5 mg Oral BID  . multivitamin with minerals  1 tablet Oral Daily  . potassium chloride  20 mEq Oral BID  . sodium chloride flush  3 mL Intravenous Q12H  . sodium chloride flush  3 mL Intravenous Q12H   Continuous Infusions:   Principal Problem:   Acute on chronic combined systolic and diastolic CHF (congestive heart failure) (HCC) Active Problems:   Obesity, morbid-BMI 45   Hyperlipidemia LDL goal <70   Type 2 diabetes, uncontrolled, with neuropathy (HCC)   Hx of CABG 2012   Ischemic cardiomyopathy-30-35% May 2016   Pulmonary hypertension (HCC)   Hypothyroidism   Painful diabetic neuropathy (HCC)   AKI (acute kidney injury) (HCC)   Bradycardia    Time spent: 25 minutes    Doyt Castellana  [facility] Pager [phone] If 7PM-7AM, please contact night-coverage at [email or web address], password [facility] 12/10/2015, 1:18 PM  LOS: 2 days

## 2015-12-11 DIAGNOSIS — R0789 Other chest pain: Secondary | ICD-10-CM | POA: Insufficient documentation

## 2015-12-11 LAB — BASIC METABOLIC PANEL
Anion gap: 11 (ref 5–15)
BUN: 22 mg/dL — AB (ref 6–20)
CHLORIDE: 96 mmol/L — AB (ref 101–111)
CO2: 33 mmol/L — ABNORMAL HIGH (ref 22–32)
CREATININE: 1.45 mg/dL — AB (ref 0.44–1.00)
Calcium: 9.4 mg/dL (ref 8.9–10.3)
GFR calc Af Amer: 43 mL/min — ABNORMAL LOW (ref >60)
GFR calc non Af Amer: 37 mL/min — ABNORMAL LOW (ref >60)
GLUCOSE: 140 mg/dL — AB (ref 65–99)
Potassium: 3.5 mmol/L (ref 3.5–5.1)
SODIUM: 140 mmol/L (ref 135–145)

## 2015-12-11 LAB — GLUCOSE, CAPILLARY
GLUCOSE-CAPILLARY: 132 mg/dL — AB (ref 65–99)
Glucose-Capillary: 178 mg/dL — ABNORMAL HIGH (ref 65–99)
Glucose-Capillary: 197 mg/dL — ABNORMAL HIGH (ref 65–99)
Glucose-Capillary: 204 mg/dL — ABNORMAL HIGH (ref 65–99)

## 2015-12-11 LAB — HEMOGLOBIN A1C
HEMOGLOBIN A1C: 8.3 % — AB (ref 4.8–5.6)
MEAN PLASMA GLUCOSE: 192 mg/dL

## 2015-12-11 MED ORDER — POTASSIUM CHLORIDE CRYS ER 20 MEQ PO TBCR
40.0000 meq | EXTENDED_RELEASE_TABLET | Freq: Two times a day (BID) | ORAL | Status: DC
  Administered 2015-12-11 – 2015-12-13 (×4): 40 meq via ORAL
  Filled 2015-12-11 (×6): qty 2

## 2015-12-11 NOTE — Progress Notes (Signed)
[facility] PROGRESS NOTE  Christine Knight GNF:[id number] DOB: [birth year] DOA: 12/08/2015 PCP: Sanda Linger, MD  Brief narrative 65 year old morbidly obese female wit history of systolic and diastolic CHF (EF of 30-35% with grade 2 diastolic dysfunction from last echo in 03/15/2015, followed with Dr. Myrtis Ser), diabetes mellitus on metformin, CAD with history of NSTE MI and subsequent CABG in 2012, pulmonary hypertension, hyperlipidemia who presented to the ED with 2 weeks history of progressive shortness of breath associated with orthopnea and significant leg swellings. She has almost 40 pound weight gain. She reports that she has has not taken her diuretics for the past 3-4 days as she wasn't able to afford them. Denies any recent illness or travel. Patient admitted for acute on chronic CHF with anasarca.  Assessment/Plan: Acute on chronic systolic and diastolic CHF with anasarca Patient is 40 pounds above her baseline weight. Switched to Lasix GTT (  q12 hr) . Added metolazone 2.5 mg twice a day. Needs aggressive diuresis. (5.2 L negative since admission, symptoms slightly better, however weight hasn't gone down much) 2-D echo shows worsened EF of 15-20% with diffuse hypokinesis. Patient was admitted with NSTE MI in 2016 but refused cardiac cath and sent home on medical management. -Seen by cardiology and recommends left heart catheterization once symptoms better with diuresis ( she is still not able to lay down flat) . Patient undecided at this time and once another day to think about it. -Monitor lites closely. Continue daily potassium. CO2 and creatinine slowly increasing. Monitor closely. -Resumed low-dose Coreg. Continue BiDil.    Type 2 diabetes mellitus with hypoglycemia Held  medications due to low FSG. Check A1c of 8.3. Monitor on sliding scale coverage.  Coronary artery disease with history of CABG Continue aspirin and statin. Resumed low-dose Coreg as heart rate is  stable  Sinus bradycardia on admission Stable on telemetry. Resumed   low-dose Coreg.  NSVT and PVCs Potassium and magnesium normal. Added low dose Coreg.  Acute kidney injury Possibly cardiorenal. Continue aggressive diuresis for now.  Hypothyroidism Not on any medications. TSH normal.   DVT prophylaxis: Subcutaneous Lovenox Diet: Heart healthy with fluid restriction.     Code Status: Full code Family Communication: None at bedside Disposition Plan: Will need several days of IV diuresis and cardiac cath eventually. (If patient agrees) . Home once clinically improved.   Consultants:  Cardiology  Procedures:  None  Antibiotics:  None  HPI/Subjective: Shortness of breath better but still unable to lie down flat.  Objective: Filed Vitals:   12/10/15 2239 12/11/15 0549  BP: 104/66 116/76  Pulse: 71 66  Temp: 98.5 F (36.9 C) 97.7 F (36.5 C)  Resp: 20 16    Intake/Output Summary (Last 24 hours) at 12/11/15 1310 Last data filed at 12/11/15 1001  Gross per 24 hour  Intake     63 ml  Output   3450 ml  Net  -3387 ml   Filed Weights   12/08/15 1339 12/10/15 0700  Weight: 140.161 kg (309 lb) 139.98 kg (308 lb 9.6 oz)    Exam:   General:  Elderly obese female not in distress  HEENT:JVD++, moist mucosa  Chest: Diminished bilateral breath sounds  CVS: Normal S1 and S2, no murmurs  GI: Pitting abdominal wall edema (improved since admission), nontender, nondistended  Musculoskeletal:3+ pitting edema bilaterally    Data Reviewed: Basic Metabolic Panel:  Recent Labs Lab 12/08/15 0922 12/09/15 0346 12/10/15 0520 12/11/15 0445  NA 137 140 138 140  K 4.0 4.0 3.6  3.5  CL 97* 98* 97* 96*  CO2 29 32 31 33*  GLUCOSE 173* 105* 79 140*  BUN 22* 22* 23* 22*  CREATININE 1.31* 1.30* 1.42* 1.45*  CALCIUM 9.0 8.8* 8.7* 9.4  MG  --   --  2.3  --    Liver Function Tests:  Recent Labs Lab 12/08/15 0922  AST 28  ALT 12*  ALKPHOS 117  BILITOT  1.5*  PROT 8.1  ALBUMIN 3.5   No results for input(s): LIPASE, AMYLASE in the last 168 hours. No results for input(s): AMMONIA in the last 168 hours. CBC:  Recent Labs Lab 12/08/15 0922  WBC 5.5  NEUTROABS 3.9  HGB 13.7  HCT 43.5  MCV 94.2  PLT 275   Cardiac Enzymes:  Recent Labs Lab 12/08/15 1535 12/08/15 2200 12/09/15 0340  TROPONINI 0.03 0.03 0.03   BNP (last 3 results)  Recent Labs  05/29/15 1608 06/04/15 0945 12/08/15 0922  BNP 921.7* 340.1* 1155.5*    ProBNP (last 3 results) No results for input(s): PROBNP in the last 8760 hours.  CBG:  Recent Labs Lab 12/10/15 1230 12/10/15 1706 12/10/15 2217 12/11/15 0734 12/11/15 1137  GLUCAP 95 128* 161* 132* 178*    No results found for this or any previous visit (from the past 240 hour(s)).   Studies: No results found.  Scheduled Meds: . aspirin  81 mg Oral Daily  . carvedilol  3.125 mg Oral BID WC  . enoxaparin (LOVENOX) injection  40 mg Subcutaneous Q24H  . furosemide  160 mg Intravenous BID  . insulin aspart  0-5 Units Subcutaneous QHS  . insulin aspart  0-9 Units Subcutaneous TID WC  . isosorbide-hydrALAZINE  1 tablet Oral BID  . metolazone  2.5 mg Oral BID  . multivitamin with minerals  1 tablet Oral Daily  . potassium chloride  20 mEq Oral BID  . sodium chloride flush  3 mL Intravenous Q12H  . sodium chloride flush  3 mL Intravenous Q12H   Continuous Infusions:   Principal Problem:   Acute on chronic combined systolic and diastolic CHF (congestive heart failure) (HCC) Active Problems:   Obesity, morbid-BMI 45   Hyperlipidemia LDL goal <70   Type 2 diabetes, uncontrolled, with neuropathy (HCC)   Hx of CABG 2012   Ischemic cardiomyopathy-30-35% May 2016   Pulmonary hypertension (HCC)   Hypothyroidism   Painful diabetic neuropathy (HCC)   AKI (acute kidney injury) (HCC)   Bradycardia    Time spent: 25 minutes    Christine Knight  [facility] Pager [phone] If 7PM-7AM,  please contact night-coverage at [email or web address], password [facility] 12/11/2015, 1:10 PM  LOS: 3 days

## 2015-12-11 NOTE — Progress Notes (Signed)
65 y.o. female with a past medical history significant for chronic systolic congestive heart failure, morbid obesity, type 2 diabetes, dyslipidemia, recent NSTEMI in July 2016 and prior CABG x 4 in 2012 with LIMA to LAD, SVG to diagonal, SVG to OM and SVG to PDA. I saw her along with Dr. Myrtis Ser this past summer in the hospital for congestive heart failure and NSTEMI. We had recommended cardiac catheterization at that time but she adamantly refused. She was diuresed with improvement in her symptoms. Her discharge rate was 121 kg. She is now admitted with a two-week history of progressive shortness of breath, lower extremity edema and proximally 40 pound weight gain (admission weight is 140 kg).   Subjective: No chest pain Still with SOB and cough unable to lie flat.   Objective: Vital signs in last 24 hours: Temp:  [97.7 F (36.5 C)-98.5 F (36.9 C)] 97.7 F (36.5 C) (01/30 0549) Pulse Rate:  [66-92] 66 (01/30 0549) Resp:  [15-20] 16 (01/30 0549) BP: (104-121)/(66-76) 116/76 mmHg (01/30 0549) SpO2:  [92 %-96 %] 96 % (01/30 0549) Weight change:  Last BM Date: 12/10/15 Intake/Output from previous day: -4750 01/29 0701 - 01/30 0700 In: -  Out: 4750 [Urine:4750] Intake/Output this shift: Total I/O In: 60 [P.O.:60] Out: -   PE: General:Pleasant affect, NAD, up in chair Skin:Warm and dry, brisk capillary refill HEENT:normocephalic, sclera clear, mucus membranes moist Heart:S1S2 RRR without murmur, gallup, rub or click Lungs:diminished half way up, without rales, rhonchi, or wheezes ZOX:WRUEA, soft, non tender, + BS, do not palpate liver spleen or masses Ext:2-4 +  lower ext edema up through her hips,  2+ radial pulses Neuro:alert and oriented X 3, MAE, follows commands, + facial symmetry Tele: SR with NSVT     Lab Results:  Recent Labs  12/08/15 0922  WBC 5.5  HGB 13.7  HCT 43.5  PLT 275   BMET  Recent Labs  12/10/15 0520 12/11/15 0445  NA 138 140  K 3.6 3.5    CL 97* 96*  CO2 31 33*  GLUCOSE 79 140*  BUN 23* 22*  CREATININE 1.42* 1.45*  CALCIUM 8.7* 9.4    Recent Labs  12/08/15 2200 12/09/15 0340  TROPONINI 0.03 0.03    Lab Results  Component Value Date   CHOL 136 12/09/2015   HDL 26* 12/09/2015   LDLCALC 94 12/09/2015   TRIG 82 12/09/2015   CHOLHDL 5.2 12/09/2015   Lab Results  Component Value Date   HGBA1C 7.8* 07/25/2015     Lab Results  Component Value Date   TSH 3.253 12/08/2015    Hepatic Function Panel  Recent Labs  12/08/15 0922  PROT 8.1  ALBUMIN 3.5  AST 28  ALT 12*  ALKPHOS 117  BILITOT 1.5*    Recent Labs  12/09/15 0340  CHOL 136   No results for input(s): PROTIME in the last 72 hours.     Studies/Results: Echo: Study Conclusions  - Left ventricle: The cavity size was mildly dilated. Wall thickness was normal. The estimated ejection fraction was in the range of 15% to 20%. Diffuse hypokinesis. Doppler parameters are consistent with restrictive physiology, indicative of decreased left ventricular diastolic compliance and/or increased left atrial pressure. - Mitral valve: There was mild regurgitation. - Left atrium: The atrium was moderately dilated. - Right ventricle: The cavity size was mildly dilated. Systolic function was moderately reduced. - Right atrium: The atrium was moderately dilated. - Tricuspid valve: There was severe  regurgitation. - Pulmonary arteries: Systolic pressure was moderately increased. PA peak pressure: 57 mm Hg (S). - Impressions: Technically limited study due to poor sound wave transmission.  Impressions:  - Technically limited study due to poor sound wave transmission.  Medications: I have reviewed the patient's current medications. Scheduled Meds: . aspirin  81 mg Oral Daily  . carvedilol  3.125 mg Oral BID WC  . enoxaparin (LOVENOX) injection  40 mg Subcutaneous Q24H  . furosemide  160 mg Intravenous BID  . insulin aspart  0-5  Units Subcutaneous QHS  . insulin aspart  0-9 Units Subcutaneous TID WC  . isosorbide-hydrALAZINE  1 tablet Oral BID  . metolazone  2.5 mg Oral BID  . multivitamin with minerals  1 tablet Oral Daily  . potassium chloride  20 mEq Oral BID  . sodium chloride flush  3 mL Intravenous Q12H  . sodium chloride flush  3 mL Intravenous Q12H   Continuous Infusions:  PRN Meds:.sodium chloride, acetaminophen **OR** acetaminophen, alum & mag hydroxide-simeth, guaiFENesin, ondansetron **OR** ondansetron (ZOFRAN) IV, oxyCODONE, polyethylene glycol, sodium chloride flush  Assessment/Plan: Principal Problem:   Acute on chronic combined systolic and diastolic CHF (congestive heart failure) (HCC) Active Problems:   Obesity, morbid-BMI 45   Hyperlipidemia LDL goal <70   Type 2 diabetes, uncontrolled, with neuropathy (HCC)   Hx of CABG 2012   Ischemic cardiomyopathy-30-35% May 2016   Pulmonary hypertension (HCC)   Hypothyroidism   Painful diabetic neuropathy (HCC)   AKI (acute kidney injury) (HCC)   Bradycardia  1. Acute on chronic congestive systolic heart failure, NYHA class IV - EF now 15-20% 2. Now with -4750 overnight.  IV lasix drip and metolazone at 2.5 mg. On ASA and low dose coreg.  Also bidil.  (since admit -5290)  Not weighed today  Cr 1.45 today (1.31 on admit) 3. --hold ARB (allergy to ACE) until after cath to protect kidneys. 4.   5. Morbid obesity,per IM 6. Uncontrolled type 2 diabetes per IM 7. Prior CABG 4 2012 8. Recent NSTEMI in July 2016 - refused cath at that time 9. NSVT-secondary to #1 sort runs.  10. Cardiomyopathy with EF now 15-20% down from 30-35% in 03/2015.-- Dr. Rennis Golden discussed cardiac cath with pt she has decided to proceed but cannot lie flat yet and is coughing a lot.    LOS: 3 days   Time spent with pt. : 15 minutes. [facility] R  Nurse Practitioner Certified Pager [phone] or after 5pm and on weekends call [phone] 12/11/2015, 8:34 AM   I have examined the  patient and reviewed assessment and plan and discussed with patient.  Agree with above as stated.  Diuresing.  Will have to watch renal function in anticipation of cardiac cath to evaluate her decreased LVEF.  I suspect she will need advanced heart failure management in the future given the severity of her LV dysfunction.   Oliveah Zwack S.

## 2015-12-12 LAB — GLUCOSE, CAPILLARY
GLUCOSE-CAPILLARY: 164 mg/dL — AB (ref 65–99)
GLUCOSE-CAPILLARY: 164 mg/dL — AB (ref 65–99)
GLUCOSE-CAPILLARY: 145 mg/dL — AB (ref 65–99)
Glucose-Capillary: 169 mg/dL — ABNORMAL HIGH (ref 65–99)

## 2015-12-12 LAB — BASIC METABOLIC PANEL
ANION GAP: 10 (ref 5–15)
BUN: 26 mg/dL — ABNORMAL HIGH (ref 6–20)
CALCIUM: 9 mg/dL (ref 8.9–10.3)
CO2: 33 mmol/L — ABNORMAL HIGH (ref 22–32)
CREATININE: 1.36 mg/dL — AB (ref 0.44–1.00)
Chloride: 93 mmol/L — ABNORMAL LOW (ref 101–111)
GFR, EST AFRICAN AMERICAN: 47 mL/min — AB (ref >60)
GFR, EST NON AFRICAN AMERICAN: 40 mL/min — AB (ref >60)
Glucose, Bld: 175 mg/dL — ABNORMAL HIGH (ref 65–99)
Potassium: 3.3 mmol/L — ABNORMAL LOW (ref 3.5–5.1)
SODIUM: 136 mmol/L (ref 135–145)

## 2015-12-12 LAB — MAGNESIUM: Magnesium: 2.2 mg/dL (ref 1.7–2.4)

## 2015-12-12 MED ORDER — SODIUM CHLORIDE 0.9% FLUSH: 3.0000 mL | Freq: Two times a day (BID) | INTRAVENOUS | Status: DC

## 2015-12-12 MED ORDER — SODIUM CHLORIDE 0.9 % IV SOLN: 250.0000 mL | INTRAVENOUS | Status: DC | PRN

## 2015-12-12 MED ORDER — SODIUM CHLORIDE 0.9 % IV SOLN: INTRAVENOUS | Status: DC

## 2015-12-12 MED ORDER — ASPIRIN 81 MG PO CHEW: 81.0000 mg | CHEWABLE_TABLET | ORAL | Status: DC

## 2015-12-12 MED ORDER — POTASSIUM CHLORIDE CRYS ER 20 MEQ PO TBCR
40.0000 meq | EXTENDED_RELEASE_TABLET | Freq: Once | ORAL | Status: AC
  Administered 2015-12-12: 40 meq via ORAL

## 2015-12-12 MED ORDER — SODIUM CHLORIDE 0.9% FLUSH
3.0000 mL | INTRAVENOUS | Status: DC | PRN
  Administered 2015-12-12: 3 mL via INTRAVENOUS
  Filled 2015-12-12: qty 3

## 2015-12-12 MED ORDER — LEVALBUTEROL HCL 0.63 MG/3ML IN NEBU: 0.6300 mg | INHALATION_SOLUTION | Freq: Three times a day (TID) | RESPIRATORY_TRACT | Status: DC

## 2015-12-12 MED ORDER — LEVALBUTEROL HCL 0.63 MG/3ML IN NEBU
0.6300 mg | INHALATION_SOLUTION | Freq: Three times a day (TID) | RESPIRATORY_TRACT | Status: DC
  Administered 2015-12-12 (×2): 0.63 mg via RESPIRATORY_TRACT
  Filled 2015-12-12 (×2): qty 3

## 2015-12-12 MED ORDER — LEVALBUTEROL HCL 0.63 MG/3ML IN NEBU
0.6300 mg | INHALATION_SOLUTION | Freq: Three times a day (TID) | RESPIRATORY_TRACT | Status: DC
  Administered 2015-12-13 – 2015-12-14 (×3): 0.63 mg via RESPIRATORY_TRACT
  Filled 2015-12-12 (×4): qty 3

## 2015-12-12 NOTE — Progress Notes (Signed)
[facility] PROGRESS NOTE  Christine Knight ZOX:[id number] DOB: 03-Nov-1951 DOA: 12/08/2015 PCP: Sanda Linger, MD  Brief narrative 65 year old morbidly obese female wit history of systolic and diastolic CHF (EF of 30-35% with grade 2 diastolic dysfunction from last echo in 03/15/2015, followed with Dr. Myrtis Ser), diabetes mellitus on metformin, CAD with history of NSTE MI and subsequent CABG in 2012, pulmonary hypertension, hyperlipidemia who presented to the ED with 2 weeks history of progressive shortness of breath associated with orthopnea and significant leg swellings. She has almost 40 pound weight gain. She reports that she has has not taken her diuretics for the past 3-4 days as she wasn't able to afford them. Denies any recent illness or travel. Patient admitted for acute on chronic CHF with anasarca.  Assessment/Plan: Acute on chronic systolic and diastolic CHF with anasarca Patient is 40 pounds above her baseline weight. Switched to Lasix GTT (  q12 hr) . Added metolazone 2.5 mg twice a day. Needs aggressive diuresis. (7.7 lLnegative since admission, symptoms slightly better, however weight still hasn't gone down much) 2-D echo shows worsened EF of 15-20% with diffuse hypokinesis. Patient was admitted with NSTE MI in 2016 but refused cardiac cath and sent home on medical management. -Monitor lites closely. Continue daily potassium (added further dose today).. Magnesium normal.  CO2 and creatinine slowly increasing. Monitor closely. -Resumed low-dose Coreg. Continue BiDil. Allergy to lisinopril. Holding ARB until cardiac cath.  Seen by cardiology with plan on left heart catheterization on 2/1 if renal function stable..    Type 2 diabetes mellitus with hypoglycemia Held  medications due to low FSG. A1c of 8.3. Monitor on sliding scale coverage.  Coronary artery disease with history of CABG Continue aspirin, Coreg and statin.   Sinus bradycardia on admission Stable on telemetry.  Resumed   low-dose Coreg.  NSVT and PVCs Potassium and magnesium normal. Added low dose Coreg.  Acute kidney injury Possibly cardiorenal. Continue aggressive diuresis for now.  Hypothyroidism Not on any medications. TSH normal.   DVT prophylaxis: Subcutaneous Lovenox  Diet: Heart healthy with fluid restriction. Nothing by mouth after midnight for cardiac cath on 2/1     Code Status: Full code Family Communication: None at bedside Disposition Plan: Cardiac cath on 2/1. Will need to stay in hospital for further diuresis.   Consultants:  Cardiology  Procedures:  None  Antibiotics:  None  HPI/Subjective: Shortness of breath better but still unable to lie down flat.  Objective: Filed Vitals:   12/11/15 2210 12/12/15 0645  BP: 142/90 117/82  Pulse: 69 67  Temp: 98.2 F (36.8 C) 98.7 F (37.1 C)  Resp: 17 16    Intake/Output Summary (Last 24 hours) at 12/12/15 1256 Last data filed at 12/12/15 1015  Gross per 24 hour  Intake   1173 ml  Output   2950 ml  Net  -1777 ml   Filed Weights   12/08/15 1339 12/10/15 0700  Weight: 140.161 kg (309 lb) 139.98 kg (308 lb 9.6 oz)    Exam:   General:  Elderly obese female not in distress  HEENT:JVD++, moist mucosa  Chest: Diminished bilateral breath sounds  CVS: Normal S1 and S2, no murmurs  GI: Pitting abdominal wall edema  nontender, nondistended  Musculoskeletal:3+ pitting edema bilaterally up to the thighs  CNS: Alert and oriented    Data Reviewed: Basic Metabolic Panel:  Recent Labs Lab 12/08/15 0922 12/09/15 0346 12/10/15 0520 12/11/15 0445 12/12/15 0610 12/12/15 0615  NA 137 140 138 140 136  --  K 4.0 4.0 3.6 3.5 3.3*  --   CL 97* 98* 97* 96* 93*  --   CO2 29 32 31 33* 33*  --   GLUCOSE 173* 105* 79 140* 175*  --   BUN 22* 22* 23* 22* 26*  --   CREATININE 1.31* 1.30* 1.42* 1.45* 1.36*  --   CALCIUM 9.0 8.8* 8.7* 9.4 9.0  --   MG  --   --  2.3  --   --  2.2   Liver Function  Tests:  Recent Labs Lab 12/08/15 0922  AST 28  ALT 12*  ALKPHOS 117  BILITOT 1.5*  PROT 8.1  ALBUMIN 3.5   No results for input(s): LIPASE, AMYLASE in the last 168 hours. No results for input(s): AMMONIA in the last 168 hours. CBC:  Recent Labs Lab 12/08/15 0922  WBC 5.5  NEUTROABS 3.9  HGB 13.7  HCT 43.5  MCV 94.2  PLT 275   Cardiac Enzymes:  Recent Labs Lab 12/08/15 1535 12/08/15 2200 12/09/15 0340  TROPONINI 0.03 0.03 0.03   BNP (last 3 results)  Recent Labs  05/29/15 1608 06/04/15 0945 12/08/15 0922  BNP 921.7* 340.1* 1155.5*    ProBNP (last 3 results) No results for input(s): PROBNP in the last 8760 hours.  CBG:  Recent Labs Lab 12/11/15 1137 12/11/15 1631 12/11/15 2247 12/12/15 0737 12/12/15 1244  GLUCAP 178* 197* 204* 164* 169*    No results found for this or any previous visit (from the past 240 hour(s)).   Studies: No results found.  Scheduled Meds: . aspirin  81 mg Oral Daily  . carvedilol  3.125 mg Oral BID WC  . enoxaparin (LOVENOX) injection  40 mg Subcutaneous Q24H  . furosemide  160 mg Intravenous BID  . insulin aspart  0-5 Units Subcutaneous QHS  . insulin aspart  0-9 Units Subcutaneous TID WC  . isosorbide-hydrALAZINE  1 tablet Oral BID  . levalbuterol  0.63 mg Nebulization Q8H  . metolazone  2.5 mg Oral BID  . multivitamin with minerals  1 tablet Oral Daily  . potassium chloride  40 mEq Oral BID  . sodium chloride flush  3 mL Intravenous Q12H  . sodium chloride flush  3 mL Intravenous Q12H   Continuous Infusions:     Time spent: 25 minutes    Christine Knight  [facility] Pager [phone] If 7PM-7AM, please contact night-coverage at [email or web address], password [facility] 12/12/2015, 12:56 PM  LOS: 4 days

## 2015-12-12 NOTE — Progress Notes (Signed)
65 y.o. female with a past medical history significant for chronic systolic congestive heart failure, morbid obesity, type 2 diabetes, dyslipidemia, recent NSTEMI in July 2016 and prior CABG x 4 in 2012 with LIMA to LAD, SVG to diagonal, SVG to OM and SVG to PDA. I saw her along with Dr. Myrtis Ser this past summer in the hospital for congestive heart failure and NSTEMI. We had recommended cardiac catheterization at that time but she adamantly refused. She was diuresed with improvement in her symptoms. Her discharge rate was 121 kg. She is now admitted with a two-week history of progressive shortness of breath, lower extremity edema and proximally 40 pound weight gain (admission weight is 140 kg).   Subjective: Episode of cough and SOB this AM,  Just now with chest tightness but improved sitting up in bed and has resolved.   Objective: Vital signs in last 24 hours: Temp:  [98.2 F (36.8 C)-98.8 F (37.1 C)] 98.7 F (37.1 C) (01/31 0645) Pulse Rate:  [67-72] 67 (01/31 0645) Resp:  [16-17] 16 (01/31 0645) BP: (117-142)/(73-90) 117/82 mmHg (01/31 0645) SpO2:  [91 %-95 %] 91 % (01/31 0645) Weight change:  Last BM Date: 12/10/15 Intake/Output from previous day: -2037 01/30 0701 - 01/31 0700 In: 1113 [P.O.:780; I.V.:3; IV Piggyback:330] Out: 3150 [Urine:3150] Intake/Output this shift: Total I/O In: 363 [P.O.:360; I.V.:3] Out: 800 [Urine:800]  PE: General:Pleasant affect, NAD- now though recently with chest tightness now resolved Skin:Warm and dry, brisk capillary refill HEENT:normocephalic, sclera clear, mucus membranes moist Heart:S1S2 RRR without murmur, gallup, rub or click Lungs:diminished throughout some rales, no rhonchi, or wheezes ZOX:WRUEA, soft, non tender, + BS, do not palpate liver spleen or masses Ext:3-4+  lower ext edema to her hips, 2+ radial pulses Neuro:alert and oriented X 3, MAE, follows commands, + facial symmetry  Tele: SR with NSVT  Lab Results: No results  for input(s): WBC, HGB, HCT, PLT in the last 72 hours. BMET  Recent Labs  12/11/15 0445 12/12/15 0610  NA 140 136  K 3.5 3.3*  CL 96* 93*  CO2 33* 33*  GLUCOSE 140* 175*  BUN 22* 26*  CREATININE 1.45* 1.36*  CALCIUM 9.4 9.0   No results for input(s): TROPONINI in the last 72 hours.  Invalid input(s): CK, MB  Lab Results  Component Value Date   CHOL 136 12/09/2015   HDL 26* 12/09/2015   LDLCALC 94 12/09/2015   TRIG 82 12/09/2015   CHOLHDL 5.2 12/09/2015   Lab Results  Component Value Date   HGBA1C 8.3* 12/09/2015     Lab Results  Component Value Date   TSH 3.253 12/08/2015      Studies/Results: No results found.  Medications: I have reviewed the patient's current medications. Scheduled Meds: . aspirin  81 mg Oral Daily  . carvedilol  3.125 mg Oral BID WC  . enoxaparin (LOVENOX) injection  40 mg Subcutaneous Q24H  . furosemide  160 mg Intravenous BID  . insulin aspart  0-5 Units Subcutaneous QHS  . insulin aspart  0-9 Units Subcutaneous TID WC  . isosorbide-hydrALAZINE  1 tablet Oral BID  . metolazone  2.5 mg Oral BID  . multivitamin with minerals  1 tablet Oral Daily  . potassium chloride  40 mEq Oral BID  . sodium chloride flush  3 mL Intravenous Q12H  . sodium chloride flush  3 mL Intravenous Q12H   Continuous Infusions:  PRN Meds:.sodium chloride, acetaminophen **OR** acetaminophen, alum & mag hydroxide-simeth, guaiFENesin, ondansetron **  OR** ondansetron (ZOFRAN) IV, oxyCODONE, polyethylene glycol, sodium chloride flush  Assessment/Plan: Principal Problem:   Acute on chronic combined systolic and diastolic CHF (congestive heart failure) (HCC) Active Problems:   Obesity, morbid-BMI 45   Hyperlipidemia LDL goal <70   Type 2 diabetes, uncontrolled, with neuropathy (HCC)   Hx of CABG 2012   Ischemic cardiomyopathy-30-35% May 2016   Pulmonary hypertension (HCC)   Hypothyroidism   Painful diabetic neuropathy (HCC)   AKI (acute kidney injury) (HCC)    Bradycardia   Chest tightness  1. Acute on chronic congestive systolic heart failure, NYHA class IV - EF now 15-20% 2.  -2037 overnight. IV lasix drip and metolazone at 2.5 mg. IV out currently planning on PICC line.  On ASA and low dose coreg. Also bidil. (since admit -7327) Not weighed today Cr 1.45--> 1.36  today (1.31 on admit) 3. --hold ARB (allergy to ACE) until after cath to protect kidneys. 4. --add xopenex neb to see if helps wit the SOB. 5.  6. Morbid obesity,per IM 7. Uncontrolled type 2 diabetes per IM 8. Prior CABG 4 2012 9. Recent NSTEMI in July 2016 - refused cath at that time 10. NSVT-secondary to #1 short runs.  11. Cardiomyopathy with EF now 15-20% down from 30-35% in 03/2015.-- Dr. Rennis Golden discussed cardiac cath with pt she has decided to proceed but cannot lie flat yet and is coughing a lot. Perhaps by Thursday.  Her edema continues to be significant.  ? IV milrinone with transfer to [facility]  And HF consult?   12.    LOS: 4 days   Time spent with pt. :15 minutes. [facility] R  Nurse Practitioner Certified Pager [phone] or after 5pm and on weekends call [phone] 12/12/2015, 11:02 AM   I have examined the patient and reviewed assessment and plan and discussed with patient.  Agree with above as stated.  Breathing is better.  SHe can lie flat easier at this time.  Plan for cath tomorrow if renal function ok.  Stable at this time.  Would try for left radial approach so that she could sit up after the procedure.  Cath will likely need to be done with a wedge to keep her head up.   Unnamed Zeien S.

## 2015-12-13 ENCOUNTER — Encounter (HOSPITAL_COMMUNITY)
Admission: EM | Disposition: A | Discharge status: Home Health | Payer: Self-pay | Source: Home / Self Care | Attending: Internal Medicine

## 2015-12-13 ENCOUNTER — Encounter (HOSPITAL_COMMUNITY): Payer: Self-pay | Admitting: Cardiology

## 2015-12-13 DIAGNOSIS — I2510 Atherosclerotic heart disease of native coronary artery without angina pectoris: Secondary | ICD-10-CM

## 2015-12-13 DIAGNOSIS — I472 Ventricular tachycardia: Secondary | ICD-10-CM

## 2015-12-13 HISTORY — PX: CARDIAC CATHETERIZATION: SHX172

## 2015-12-13 LAB — POCT I-STAT 3, VENOUS BLOOD GAS (G3P V)
Acid-Base Excess: 14 mmol/L — ABNORMAL HIGH (ref 0.0–2.0)
BICARBONATE: 40.4 meq/L — AB (ref 20.0–24.0)
O2 SAT: 59 %
PCO2 VEN: 57 mmHg — AB (ref 45.0–50.0)
PO2 VEN: 30 mmHg (ref 30.0–45.0)
TCO2: 42 mmol/L (ref 0–100)
pH, Ven: 7.458 — ABNORMAL HIGH (ref 7.250–7.300)

## 2015-12-13 LAB — URINALYSIS, ROUTINE W REFLEX MICROSCOPIC
Bilirubin Urine: NEGATIVE
Glucose, UA: NEGATIVE mg/dL
Hgb urine dipstick: NEGATIVE
Ketones, ur: NEGATIVE mg/dL
LEUKOCYTES UA: NEGATIVE
Nitrite: NEGATIVE
PROTEIN: NEGATIVE mg/dL
SPECIFIC GRAVITY, URINE: 1.02 (ref 1.005–1.030)
pH: 8 (ref 5.0–8.0)

## 2015-12-13 LAB — CBC
HEMATOCRIT: 38.6 % (ref 36.0–46.0)
HEMATOCRIT: 39.4 % (ref 36.0–46.0)
HEMOGLOBIN: 12.3 g/dL (ref 12.0–15.0)
Hemoglobin: 12.8 g/dL (ref 12.0–15.0)
MCH: 29.4 pg (ref 26.0–34.0)
MCH: 29.7 pg (ref 26.0–34.0)
MCHC: 31.9 g/dL (ref 30.0–36.0)
MCHC: 32.5 g/dL (ref 30.0–36.0)
MCV: 92.1 fL (ref 78.0–100.0)
MCV: 91.4 fL (ref 78.0–100.0)
Platelets: 276 10*3/uL (ref 150–400)
Platelets: 278 10*3/uL (ref 150–400)
RBC: 4.19 MIL/uL (ref 3.87–5.11)
RBC: 4.31 MIL/uL (ref 3.87–5.11)
RDW: 16.4 % — ABNORMAL HIGH (ref 11.5–15.5)
RDW: 16.1 % — AB (ref 11.5–15.5)
WBC: 5.3 10*3/uL (ref 4.0–10.5)
WBC: 5.2 10*3/uL (ref 4.0–10.5)

## 2015-12-13 LAB — POCT I-STAT 3, ART BLOOD GAS (G3+)
Acid-Base Excess: 12 mmol/L — ABNORMAL HIGH (ref 0.0–2.0)
BICARBONATE: 37.7 meq/L — AB (ref 20.0–24.0)
O2 SAT: 92 %
PH ART: 7.491 — AB (ref 7.350–7.450)
TCO2: 39 mmol/L (ref 0–100)
pCO2 arterial: 49.4 mmHg — ABNORMAL HIGH (ref 35.0–45.0)
pO2, Arterial: 60 mmHg — ABNORMAL LOW (ref 80.0–100.0)

## 2015-12-13 LAB — GLUCOSE, CAPILLARY
GLUCOSE-CAPILLARY: 140 mg/dL — AB (ref 65–99)
GLUCOSE-CAPILLARY: 132 mg/dL — AB (ref 65–99)
Glucose-Capillary: 151 mg/dL — ABNORMAL HIGH (ref 65–99)
Glucose-Capillary: 182 mg/dL — ABNORMAL HIGH (ref 65–99)

## 2015-12-13 LAB — PROTIME-INR
INR: 1.33 (ref 0.00–1.49)
PROTHROMBIN TIME: 16.6 s — AB (ref 11.6–15.2)

## 2015-12-13 LAB — CREATININE, SERUM
Creatinine, Ser: 1.32 mg/dL — ABNORMAL HIGH (ref 0.44–1.00)
GFR, EST AFRICAN AMERICAN: 48 mL/min — AB (ref >60)
GFR, EST NON AFRICAN AMERICAN: 42 mL/min — AB (ref >60)

## 2015-12-13 LAB — BASIC METABOLIC PANEL
ANION GAP: 11 (ref 5–15)
BUN: 23 mg/dL — ABNORMAL HIGH (ref 6–20)
CO2: 34 mmol/L — AB (ref 22–32)
Calcium: 9.2 mg/dL (ref 8.9–10.3)
Chloride: 92 mmol/L — ABNORMAL LOW (ref 101–111)
Creatinine, Ser: 1.32 mg/dL — ABNORMAL HIGH (ref 0.44–1.00)
GFR calc Af Amer: 48 mL/min — ABNORMAL LOW (ref >60)
GFR, EST NON AFRICAN AMERICAN: 42 mL/min — AB (ref >60)
GLUCOSE: 154 mg/dL — AB (ref 65–99)
POTASSIUM: 3.7 mmol/L (ref 3.5–5.1)
Sodium: 137 mmol/L (ref 135–145)

## 2015-12-13 LAB — CARBOXYHEMOGLOBIN
CARBOXYHEMOGLOBIN: 1.8 % — AB (ref 0.5–1.5)
Methemoglobin: 0.6 % (ref 0.0–1.5)
O2 SAT: 57.6 %
Total hemoglobin: 12.5 g/dL (ref 12.0–16.0)

## 2015-12-13 LAB — MRSA PCR SCREENING: MRSA BY PCR: NEGATIVE

## 2015-12-13 SURGERY — RIGHT/LEFT HEART CATH AND CORONARY/GRAFT ANGIOGRAPHY
Anesthesia: LOCAL

## 2015-12-13 MED ORDER — SODIUM CHLORIDE 0.9% FLUSH
3.0000 mL | Freq: Two times a day (BID) | INTRAVENOUS | Status: DC
  Administered 2015-12-13: 3 mL via INTRAVENOUS

## 2015-12-13 MED ORDER — MIDAZOLAM HCL 2 MG/2ML IJ SOLN
INTRAMUSCULAR | Status: AC
  Filled 2015-12-13: qty 2

## 2015-12-13 MED ORDER — CETYLPYRIDINIUM CHLORIDE 0.05 % MT LIQD
7.0000 mL | Freq: Two times a day (BID) | OROMUCOSAL | Status: DC
  Administered 2015-12-14 – 2015-12-24 (×19): 7 mL via OROMUCOSAL

## 2015-12-13 MED ORDER — MIDAZOLAM HCL 2 MG/2ML IJ SOLN
INTRAMUSCULAR | Status: DC | PRN
  Administered 2015-12-13: 1 mg via INTRAVENOUS

## 2015-12-13 MED ORDER — SODIUM CHLORIDE 0.9 % IV SOLN: 250.0000 mL | INTRAVENOUS | Status: DC | PRN

## 2015-12-13 MED ORDER — SODIUM CHLORIDE 0.9% FLUSH: 3.0000 mL | INTRAVENOUS | Status: DC | PRN

## 2015-12-13 MED ORDER — POTASSIUM CHLORIDE CRYS ER 20 MEQ PO TBCR
40.0000 meq | EXTENDED_RELEASE_TABLET | Freq: Three times a day (TID) | ORAL | Status: DC
  Administered 2015-12-13 – 2015-12-22 (×27): 40 meq via ORAL
  Filled 2015-12-13 (×28): qty 2

## 2015-12-13 MED ORDER — LIDOCAINE HCL (PF) 1 % IJ SOLN
INTRAMUSCULAR | Status: DC | PRN
  Administered 2015-12-13: 10 mL via INTRADERMAL

## 2015-12-13 MED ORDER — ENOXAPARIN SODIUM 80 MG/0.8ML ~~LOC~~ SOLN
65.0000 mg | SUBCUTANEOUS | Status: DC
  Administered 2015-12-14 – 2015-12-20 (×7): 65 mg via SUBCUTANEOUS
  Filled 2015-12-13 (×7): qty 0.8

## 2015-12-13 MED ORDER — HEPARIN (PORCINE) IN NACL 2-0.9 UNIT/ML-% IJ SOLN
INTRAMUSCULAR | Status: AC
  Filled 2015-12-13: qty 1500

## 2015-12-13 MED ORDER — ENOXAPARIN SODIUM 40 MG/0.4ML ~~LOC~~ SOLN: 40.0000 mg | SUBCUTANEOUS | Status: DC

## 2015-12-13 MED ORDER — LIDOCAINE HCL (PF) 1 % IJ SOLN
INTRAMUSCULAR | Status: AC
  Filled 2015-12-13: qty 30

## 2015-12-13 MED ORDER — HEPARIN (PORCINE) IN NACL 2-0.9 UNIT/ML-% IJ SOLN
INTRAMUSCULAR | Status: DC | PRN
  Administered 2015-12-13: 12:00:00

## 2015-12-13 MED ORDER — FENTANYL CITRATE (PF) 100 MCG/2ML IJ SOLN
INTRAMUSCULAR | Status: AC
  Filled 2015-12-13: qty 2

## 2015-12-13 MED ORDER — FUROSEMIDE 10 MG/ML IJ SOLN
30.0000 mg/h | INTRAVENOUS | Status: DC
  Administered 2015-12-13 – 2015-12-21 (×21): 30 mg/h via INTRAVENOUS
  Filled 2015-12-13 (×50): qty 25

## 2015-12-13 MED ORDER — FENTANYL CITRATE (PF) 100 MCG/2ML IJ SOLN
INTRAMUSCULAR | Status: DC | PRN
  Administered 2015-12-13: 25 ug via INTRAVENOUS

## 2015-12-13 MED ORDER — SODIUM CHLORIDE 0.9% FLUSH
10.0000 mL | Freq: Two times a day (BID) | INTRAVENOUS | Status: DC
  Administered 2015-12-13: 10 mL
  Administered 2015-12-14: 20 mL
  Administered 2015-12-15 – 2015-12-24 (×16): 10 mL
  Administered 2015-12-24: 20 mL
  Administered 2015-12-25: 10 mL
  Administered 2015-12-25: 20 mL
  Administered 2015-12-26: 30 mL
  Administered 2015-12-26: 10 mL
  Administered 2015-12-27 – 2015-12-28 (×2): 20 mL

## 2015-12-13 MED ORDER — SODIUM CHLORIDE 0.9% FLUSH: 10.0000 mL | INTRAVENOUS | Status: DC | PRN

## 2015-12-13 MED ORDER — FUROSEMIDE 10 MG/ML IJ SOLN
40.0000 mg | Freq: Once | INTRAMUSCULAR | Status: AC
  Administered 2015-12-13: 40 mg via INTRAVENOUS

## 2015-12-13 SURGICAL SUPPLY — 12 items
CATH INFINITI 5FR MULTPACK ANG (CATHETERS) ×2 IMPLANT
CATH SWAN GANZ 7F STRAIGHT (CATHETERS) ×2 IMPLANT
HOVERMATT SINGLE USE (MISCELLANEOUS) ×2 IMPLANT
KIT HEART LEFT (KITS) ×2 IMPLANT
KIT HEART RIGHT NAMIC (KITS) ×2 IMPLANT
PACK CARDIAC CATHETERIZATION (CUSTOM PROCEDURE TRAY) ×2 IMPLANT
SHEATH PINNACLE 5F 10CM (SHEATH) ×2 IMPLANT
SHEATH PINNACLE 7F 10CM (SHEATH) ×2 IMPLANT
SYR MEDRAD MARK V 150ML (SYRINGE) ×2 IMPLANT
TRANSDUCER W/STOPCOCK (MISCELLANEOUS) ×2 IMPLANT
WIRE EMERALD 3MM-J .025X260CM (WIRE) ×2 IMPLANT
WIRE EMERALD 3MM-J .035X150CM (WIRE) ×2 IMPLANT

## 2015-12-13 NOTE — Progress Notes (Signed)
Subjective: No chest pain, breathing easier  Objective: Vital signs in last 24 hours: Temp:  [97.7 F (36.5 C)-99.7 F (37.6 C)] 97.7 F (36.5 C) (02/01 0613) Pulse Rate:  [64-67] 64 (02/01 0725) Resp:  [18-22] 22 (02/01 0725) BP: (111-118)/(67-72) 118/67 mmHg (02/01 0613) SpO2:  [89 %-98 %] 95 % (02/01 0725) Weight:  [293 lb 9.6 oz (133.176 kg)] 293 lb 9.6 oz (133.176 kg) (02/01 0725) Weight change:  Last BM Date: 12/10/15 Intake/Output from previous day: -3305 01/31 0701 - 02/01 0700 In: 795 [P.O.:660; I.V.:3; IV Piggyback:132] Out: 4100 [Urine:4100] Intake/Output this shift:    PE:  General:Pleasant affect, NAD Skin:Warm and dry, brisk capillary refill HEENT:normocephalic, sclera clear, mucus membranes moist Heart:S1S2 RRR without murmur, gallup, rub or click Lungs:diminished without rales, some rhonchi, no wheezes ZOX:WRUE, non tender, + BS, do not palpate liver spleen or masses Ext:3-4+ lower ext edema to her hips,  2+ radial pulses Neuro:alert and oriented X 3, MAE, follows commands, + facial symmetry Tele:  Still with NSVT brief bursts.     Lab Results:  Recent Labs  12/13/15 0615  WBC 5.3  HGB 12.3  HCT 38.6  PLT 276   BMET  Recent Labs  12/12/15 0610 12/13/15 0615  NA 136 137  K 3.3* 3.7  CL 93* 92*  CO2 33* 34*  GLUCOSE 175* 154*  BUN 26* 23*  CREATININE 1.36* 1.32*  CALCIUM 9.0 9.2   No results for input(s): TROPONINI in the last 72 hours.  Invalid input(s): CK, MB  Lab Results  Component Value Date   CHOL 136 12/09/2015   HDL 26* 12/09/2015   LDLCALC 94 12/09/2015   TRIG 82 12/09/2015   CHOLHDL 5.2 12/09/2015   Lab Results  Component Value Date   HGBA1C 8.3* 12/09/2015     Lab Results  Component Value Date   TSH 3.253 12/08/2015     Studies/Results: No results found.  Medications: I have reviewed the patient's current medications. Scheduled Meds: . aspirin  81 mg Oral Pre-Cath  . aspirin  81 mg Oral  Daily  . carvedilol  3.125 mg Oral BID WC  . enoxaparin (LOVENOX) injection  40 mg Subcutaneous Q24H  . furosemide  160 mg Intravenous BID  . insulin aspart  0-5 Units Subcutaneous QHS  . insulin aspart  0-9 Units Subcutaneous TID WC  . isosorbide-hydrALAZINE  1 tablet Oral BID  . levalbuterol  0.63 mg Nebulization TID  . metolazone  2.5 mg Oral BID  . multivitamin with minerals  1 tablet Oral Daily  . potassium chloride  40 mEq Oral BID  . sodium chloride flush  3 mL Intravenous Q12H  . sodium chloride flush  3 mL Intravenous Q12H  . sodium chloride flush  3 mL Intravenous Q12H   Continuous Infusions: . sodium chloride     PRN Meds:.sodium chloride, sodium chloride, acetaminophen **OR** acetaminophen, alum & mag hydroxide-simeth, guaiFENesin, ondansetron **OR** ondansetron (ZOFRAN) IV, oxyCODONE, polyethylene glycol, sodium chloride flush, sodium chloride flush  Assessment/Plan: Principal Problem:   Acute on chronic combined systolic and diastolic CHF (congestive heart failure) (HCC) Active Problems:   Obesity, morbid-BMI 45   Hyperlipidemia LDL goal <70   Type 2 diabetes, uncontrolled, with neuropathy (HCC)   Hx of CABG 2012   Ischemic cardiomyopathy-30-35% May 2016   Pulmonary hypertension (HCC)   Hypothyroidism   Painful diabetic neuropathy (HCC)   AKI (acute kidney injury) (HCC)   Bradycardia   Chest  tightness  1. Acute on chronic congestive systolic heart failure, NYHA class IV - EF now 15-20% 2. -3305 overnight. and since admit -10,632 IV lasix 160 BID  and metolazone at 2.5 mg. On ASA and low dose coreg. Also bidil.  Weight 293 down from 309 today Cr 1.45--> 1.36--> 1.32 today (1.31 on admit) 3. --hold ARB (allergy to ACE) until after cath to protect kidneys. 4. --add xopenex neb to see if helps wit the SOB. 5. --better could lie at 20 degrees.   6.  7. Morbid obesity,per IM 8. Uncontrolled type 2 diabetes per IM 9. Prior CABG 4 2012 10. Recent NSTEMI in  July 2016 - refused cath at that time 11. NSVT-secondary to #1 short runs.  Cardiomyopathy with EF now 15-20% down from 30-35% in 03/2015.-- Dr. Rennis Golden discussed cardiac cath with pt .   For cardiac cath today rt and Lt and possible HF consult.  Will need foley cath for cardiac cath.     LOS: 5 days   Time spent with pt. :15 minutes. [facility] R  Nurse Practitioner Certified Pager [phone] or after 5pm and on weekends call [phone] 12/13/2015, 7:54 AM   I have examined the patient and reviewed assessment and plan and discussed with patient.  Agree with above as stated.  Cath today.  Patent grafts.  Heart failure team to evaluate low EF despite adequate perfusion.  Grissel Tyrell S.

## 2015-12-13 NOTE — Progress Notes (Signed)
Patient refused foley placement.  Notified Nada Boozer, NP.  Educated patient on reasons for foley catheter but patient refused.

## 2015-12-13 NOTE — Progress Notes (Signed)
Advanced Heart Failure Rounding Note  PCP: Dr. Sanda Linger Primary Cardiologist: Dr Myrtis Ser  Subjective:    Admitted 12/08/15 with 2 weeks of progressive SOB, LE edema, and a 40 lb weight gain. She increased her torsemide x 3 days with no improvement.   Weight 267 lbs 07/25/15, Weight 293 lbs today.  R/LHC as below consistent with volume overload and pulmonary hypertension.  Previously followed by Dr Myrtis Ser.  Wasn't peeing at home despite doubling her torsemide.  Feels somewhat better but still having SOB. Says fluid has been building up for several weeks to months.  Thinks she is up 40 lbs.  No CP. No lightheadedness or dizziness.   [facility] 12/13/15 Hemodynamics RA 25/25 (23) RV pressure 92/13  RV EDP 26 PA pressure 86/34 (56) PW mean 37 AO Pressure 114/72 (88) LV Pressure 101/12 (26) LV EDP 26 PVR 3.24 Fick CO/CI 5.85 / 2.41L/min   Prox LAD lesion, 100% stenosed. Prox Cx lesion, 99% stenosed. Mid RCA lesion, 80% stenosed. Dist RCA lesion, 100% stenosed. Patent grafts   Objective:   Weight Range: 293 lb 9.6 oz (133.176 kg) Body mass index is 45.97 kg/(m^2).   Vital Signs:   Temp:  [97.7 F (36.5 C)-99.7 F (37.6 C)] 97.7 F (36.5 C) (02/01 0613) Pulse Rate:  [64-252] 65 (02/01 1310) Resp:  [15-31] 21 (02/01 1310) BP: (94-132)/(59-85) 103/61 mmHg (02/01 1310) SpO2:  [2 %-98 %] 96 % (02/01 1310) Weight:  [293 lb 9.6 oz (133.176 kg)] 293 lb 9.6 oz (133.176 kg) (02/01 0725) Last BM Date: 12/10/15  Weight change: Filed Weights   12/08/15 1339 12/10/15 0700 12/13/15 0725  Weight: 309 lb (140.161 kg) 308 lb 9.6 oz (139.98 kg) 293 lb 9.6 oz (133.176 kg)    Intake/Output:   Intake/Output Summary (Last 24 hours) at 12/13/15 1330 Last data filed at 12/13/15 1610  Gross per 24 hour  Intake    192 ml  Output   2800 ml  Net  -2608 ml     Physical Exam: General:  Elderly appearing, NAD HEENT: normal Neck: supple. JVP difficult to assess, thick. Carotids 2+ bilat; no  bruits. No thyromegaly or nodule noted. Cor: PMI nondisplaced. RRR. No rubs, gallops or murmurs. Lungs: Diminished, worse in bases. Abdomen: Morbidly obese, soft, NT, ND, no HSM. No bruits or masses. +BS  Extremities: no cyanosis, clubbing, rash. 4+ edema into thighs. Neuro: alert & orientedx3, cranial nerves grossly intact. moves all 4 extremities w/o difficulty. Affect pleasant  Telemetry: Reviewed personally, NSR 60s  Labs: CBC  Recent Labs  12/13/15 0615  WBC 5.3  HGB 12.3  HCT 38.6  MCV 92.1  PLT 276   Basic Metabolic Panel  Recent Labs  12/12/15 0610 12/12/15 0615 12/13/15 0615  NA 136  --  137  K 3.3*  --  3.7  CL 93*  --  92*  CO2 33*  --  34*  GLUCOSE 175*  --  154*  BUN 26*  --  23*  CREATININE 1.36*  --  1.32*  CALCIUM 9.0  --  9.2  MG  --  2.2  --    Liver Function Tests No results for input(s): AST, ALT, ALKPHOS, BILITOT, PROT, ALBUMIN in the last 72 hours. No results for input(s): LIPASE, AMYLASE in the last 72 hours. Cardiac Enzymes No results for input(s): CKTOTAL, CKMB, CKMBINDEX, TROPONINI in the last 72 hours.  BNP: BNP (last 3 results)  Recent Labs  05/29/15 1608 06/04/15 0945 12/08/15 0922  BNP  921.7* 340.1* 1155.5*    ProBNP (last 3 results) No results for input(s): PROBNP in the last 8760 hours.   D-Dimer No results for input(s): DDIMER in the last 72 hours. Hemoglobin A1C No results for input(s): HGBA1C in the last 72 hours. Fasting Lipid Panel No results for input(s): CHOL, HDL, LDLCALC, TRIG, CHOLHDL, LDLDIRECT in the last 72 hours. Thyroid Function Tests No results for input(s): TSH, T4TOTAL, T3FREE, THYROIDAB in the last 72 hours.  Invalid input(s): FREET3  Other results:     Imaging/Studies:   No results found.  Latest Echo  Latest Cath   Medications:     Scheduled Medications: . aspirin  81 mg Oral Pre-Cath  . [MAR Hold] aspirin  81 mg Oral Daily  . [MAR Hold] carvedilol  3.125 mg Oral BID WC  .  [MAR Hold] enoxaparin (LOVENOX) injection  40 mg Subcutaneous Q24H  . [MAR Hold] furosemide  160 mg Intravenous BID  . [MAR Hold] insulin aspart  0-5 Units Subcutaneous QHS  . [MAR Hold] insulin aspart  0-9 Units Subcutaneous TID WC  . [MAR Hold] isosorbide-hydrALAZINE  1 tablet Oral BID  . [MAR Hold] levalbuterol  0.63 mg Nebulization TID  . [MAR Hold] metolazone  2.5 mg Oral BID  . [MAR Hold] multivitamin with minerals  1 tablet Oral Daily  . [MAR Hold] potassium chloride  40 mEq Oral BID  . [MAR Hold] sodium chloride flush  3 mL Intravenous Q12H  . [MAR Hold] sodium chloride flush  3 mL Intravenous Q12H  . sodium chloride flush  3 mL Intravenous Q12H     Infusions: . sodium chloride       PRN Medications:  [MAR Hold] sodium chloride, sodium chloride, [MAR Hold] acetaminophen **OR** [MAR Hold] acetaminophen, [MAR Hold] alum & mag hydroxide-simeth, [MAR Hold] guaiFENesin, [MAR Hold] ondansetron **OR** [MAR Hold] ondansetron (ZOFRAN) IV, [MAR Hold] oxyCODONE, [MAR Hold] polyethylene glycol, [MAR Hold] sodium chloride flush, sodium chloride flush   Assessment   1. Acute on chronic CHF - NYHA Class IV - EF 15-20% 2. CAD s/p CABG x 4 2012  3. Recent NSTEMI 4. NSVT 5. Morbid Obesity  Plan    Remains markedly volume overloaded with 4+ pitting edema into her hips.  Increase lasix to gtt at 30 mg/hr. Increase K supp to 40 meq TID.  Insert foley for aggressive diuresis.    Will place PICC for CVP monitoring and also check a coox.   Will need to watch renal function closely.   Length of Stay: 5   [name] PA-C 12/13/2015, 1:30 PM  Advanced Heart Failure Team Pager [phone] (M-F; 7a - 4p)  Please contact [facility] for night-coverage after hours (4p -7a ) and weekends on [email or web address]   Patient seen and examined with Otilio Saber, PA-C. We discussed all aspects of the encounter. I agree with the assessment and plan as stated above.   Cath results reviewed  personally. She is massively volume overloaded with poor response to bolus IV lasix. Cardiac output preserved. Will start lasix gtt at 30. Place Foley. Follow co-ox and CVP. Will need extensive HF education. Will need w/u for sleep apnea.  Bensimhon, Daniel,MD 3:25 PM

## 2015-12-13 NOTE — Progress Notes (Signed)
Patient left belongings in room 1517 at [facility].  Called daughter, Camelia Eng and left message that belongings would be at front nursing station for pickup.

## 2015-12-13 NOTE — Care Management Note (Signed)
Case Management Note  Patient Details  Name: Dayle Sherpa MRN: [id number] Date of Birth: 1951/09/04  Subjective/Objective:     Adm w chf, was transf from [facility] for card cath today 2-1               Action/Plan: lives w sister and da, pcp dr Sanda Linger   Expected Discharge Date:   (UNKNOWN)               Expected Discharge Plan:     In-House Referral:     Discharge planning Services     Post Acute Care Choice:    Choice offered to:     DME Arranged:    DME Agency:     HH Arranged:    HH Agency:     Status of Service:     Medicare Important Message Given:    Date Medicare IM Given:    Medicare IM give by:    Date Additional Medicare IM Given:    Additional Medicare Important Message give by:     If discussed at Long Length of Stay Meetings, dates discussed:    Additional Comments: ur review done  Hanley Hays, RN 12/13/2015, 1:49 PM

## 2015-12-13 NOTE — Progress Notes (Signed)
Telemetry notified RN that patient had 8-beat run of v-tach.  MD paged.  Patient asymptomatic.

## 2015-12-13 NOTE — Progress Notes (Signed)
Site area: rt groin fa and fv sheaths Site Prior to Removal:  Level 0 Pressure Applied For:  20 minutes Manual:   Yes  Patient Status During Pull:  stable Post Pull Site:  Level  0 Post Pull Instructions Given:  yes Post Pull Pulses Present: yes Dressing Applied:  tegaderm Bedrest begins @ 1320 Comments:

## 2015-12-13 NOTE — Progress Notes (Signed)
RN called for report on patient.  [facility] transported patient via stretcher with 2L oxygen via [place].  Patient consent sent with [facility].  Patient and daughter gathered belongings.  Placed in hospital gown.  Patient continues to refuse foley catheter.

## 2015-12-13 NOTE — H&P (View-Only) (Signed)
     65 y.o. female with a past medical history significant for chronic systolic congestive heart failure, morbid obesity, type 2 diabetes, dyslipidemia, recent NSTEMI in July 2016 and prior CABG x 4 in 2012 with LIMA to LAD, SVG to diagonal, SVG to OM and SVG to PDA. I saw her along with Dr. Katz this past summer in the hospital for congestive heart failure and NSTEMI. We had recommended cardiac catheterization at that time but she adamantly refused. She was diuresed with improvement in her symptoms. Her discharge rate was 121 kg. She is now admitted with a two-week history of progressive shortness of breath, lower extremity edema and proximally 40 pound weight gain (admission weight is 140 kg).   Subjective: Episode of cough and SOB this AM,  Just now with chest tightness but improved sitting up in bed and has resolved.   Objective: Vital signs in last 24 hours: Temp:  [98.2 F (36.8 C)-98.8 F (37.1 C)] 98.7 F (37.1 C) (01/31 0645) Pulse Rate:  [67-72] 67 (01/31 0645) Resp:  [16-17] 16 (01/31 0645) BP: (117-142)/(73-90) 117/82 mmHg (01/31 0645) SpO2:  [91 %-95 %] 91 % (01/31 0645) Weight change:  Last BM Date: 12/10/15 Intake/Output from previous day: -2037 01/30 0701 - 01/31 0700 In: 1113 [P.O.:780; I.V.:3; IV Piggyback:330] Out: 3150 [Urine:3150] Intake/Output this shift: Total I/O In: 363 [P.O.:360; I.V.:3] Out: 800 [Urine:800]  PE: General:Pleasant affect, NAD- now though recently with chest tightness now resolved Skin:Warm and dry, brisk capillary refill HEENT:normocephalic, sclera clear, mucus membranes moist Heart:S1S2 RRR without murmur, gallup, rub or click Lungs:diminished throughout some rales, no rhonchi, or wheezes Abd:obese, soft, non tender, + BS, do not palpate liver spleen or masses Ext:3-4+  lower ext edema to her hips, 2+ radial pulses Neuro:alert and oriented X 3, MAE, follows commands, + facial symmetry  Tele: SR with NSVT  Lab Results: No results  for input(s): WBC, HGB, HCT, PLT in the last 72 hours. BMET  Recent Labs  12/11/15 0445 12/12/15 0610  NA 140 136  K 3.5 3.3*  CL 96* 93*  CO2 33* 33*  GLUCOSE 140* 175*  BUN 22* 26*  CREATININE 1.45* 1.36*  CALCIUM 9.4 9.0   No results for input(s): TROPONINI in the last 72 hours.  Invalid input(s): CK, MB  Lab Results  Component Value Date   CHOL 136 12/09/2015   HDL 26* 12/09/2015   LDLCALC 94 12/09/2015   TRIG 82 12/09/2015   CHOLHDL 5.2 12/09/2015   Lab Results  Component Value Date   HGBA1C 8.3* 12/09/2015     Lab Results  Component Value Date   TSH 3.253 12/08/2015      Studies/Results: No results found.  Medications: I have reviewed the patient's current medications. Scheduled Meds: . aspirin  81 mg Oral Daily  . carvedilol  3.125 mg Oral BID WC  . enoxaparin (LOVENOX) injection  40 mg Subcutaneous Q24H  . furosemide  160 mg Intravenous BID  . insulin aspart  0-5 Units Subcutaneous QHS  . insulin aspart  0-9 Units Subcutaneous TID WC  . isosorbide-hydrALAZINE  1 tablet Oral BID  . metolazone  2.5 mg Oral BID  . multivitamin with minerals  1 tablet Oral Daily  . potassium chloride  40 mEq Oral BID  . sodium chloride flush  3 mL Intravenous Q12H  . sodium chloride flush  3 mL Intravenous Q12H   Continuous Infusions:  PRN Meds:.sodium chloride, acetaminophen **OR** acetaminophen, alum & mag hydroxide-simeth, guaiFENesin, ondansetron **  OR** ondansetron (ZOFRAN) IV, oxyCODONE, polyethylene glycol, sodium chloride flush  Assessment/Plan: Principal Problem:   Acute on chronic combined systolic and diastolic CHF (congestive heart failure) (HCC) Active Problems:   Obesity, morbid-BMI 45   Hyperlipidemia LDL goal <70   Type 2 diabetes, uncontrolled, with neuropathy (HCC)   Hx of CABG 2012   Ischemic cardiomyopathy-30-35% May 2016   Pulmonary hypertension (HCC)   Hypothyroidism   Painful diabetic neuropathy (HCC)   AKI (acute kidney injury) (HCC)    Bradycardia   Chest tightness  1. Acute on chronic congestive systolic heart failure, NYHA class IV - EF now 15-20% 2.  -2037 overnight. IV lasix drip and metolazone at 2.5 mg. IV out currently planning on PICC line.  On ASA and low dose coreg. Also bidil. (since admit -7327) Not weighed today Cr 1.45--> 1.36  today (1.31 on admit) 3. --hold ARB (allergy to ACE) until after cath to protect kidneys. 4. --add xopenex neb to see if helps wit the SOB. 5.  6. Morbid obesity,per IM 7. Uncontrolled type 2 diabetes per IM 8. Prior CABG 4 2012 9. Recent NSTEMI in July 2016 - refused cath at that time 10. NSVT-secondary to #1 short runs.  11. Cardiomyopathy with EF now 15-20% down from 30-35% in 03/2015.-- Dr. Rennis Golden discussed cardiac cath with pt she has decided to proceed but cannot lie flat yet and is coughing a lot. Perhaps by Thursday.  Her edema continues to be significant.  ? IV milrinone with transfer to [facility]  And HF consult?   12.    LOS: 4 days   Time spent with pt. :15 minutes. [facility] R  Nurse Practitioner Certified Pager [phone] or after 5pm and on weekends call [phone] 12/12/2015, 11:02 AM   I have examined the patient and reviewed assessment and plan and discussed with patient.  Agree with above as stated.  Breathing is better.  SHe can lie flat easier at this time.  Plan for cath tomorrow if renal function ok.  Stable at this time.  Would try for left radial approach so that she could sit up after the procedure.  Cath will likely need to be done with a wedge to keep her head up.   VARANASI,JAYADEEP S.

## 2015-12-13 NOTE — Progress Notes (Signed)
Peripherally Inserted Central Catheter/Midline Placement  The IV Nurse has discussed with the patient and/or persons authorized to consent for the patient, the purpose of this procedure and the potential benefits and risks involved with this procedure.  The benefits include less needle sticks, lab draws from the catheter and patient may be discharged home with the catheter.  Risks include, but not limited to, infection, bleeding, blood clot (thrombus formation), and puncture of an artery; nerve damage and irregular heat beat.  Alternatives to this procedure were also discussed.  PICC/Midline Placement Documentation  PICC Double Lumen 12/13/15 PICC Right Brachial 45 cm 2 cm (Active)  Indication for Insertion or Continuance of Line Vasoactive infusions 12/13/2015  4:40 PM  Exposed Catheter (cm) 2 cm 12/13/2015  4:40 PM  Dressing Change Due 12/20/15 12/13/2015  4:40 PM       Stacie Glaze Horton 12/13/2015, 4:56 PM

## 2015-12-13 NOTE — Interval H&P Note (Signed)
History and Physical Interval Note:  12/13/2015 11:21 AM  Christine Knight  has presented today for surgery, with the diagnosis of chf  The various methods of treatment have been discussed with the patient and family. After consideration of risks, benefits and other options for treatment, the patient has consented to  Procedure(s): Right/Left Heart Cath and Coronary/Graft Angiography (N/A) as a surgical intervention .  The patient's history has been reviewed, patient examined, no change in status, stable for surgery.  I have reviewed the patient's chart and labs.  Questions were answered to the patient's satisfaction.   Cath Lab Visit (complete for each Cath Lab visit)  Clinical Evaluation Leading to the Procedure:   ACS: No.  Non-ACS:    Anginal Classification: CCS II  Anti-ischemic medical therapy: Maximal Therapy (2 or more classes of medications)  Non-Invasive Test Results: No non-invasive testing performed  Prior CABG: Previous CABG        Theron Arista [facility] 12/13/2015 11:21 AM

## 2015-12-14 DIAGNOSIS — E876 Hypokalemia: Secondary | ICD-10-CM

## 2015-12-14 LAB — GLUCOSE, CAPILLARY
GLUCOSE-CAPILLARY: 215 mg/dL — AB (ref 65–99)
GLUCOSE-CAPILLARY: 188 mg/dL — AB (ref 65–99)
GLUCOSE-CAPILLARY: 293 mg/dL — AB (ref 65–99)
Glucose-Capillary: 180 mg/dL — ABNORMAL HIGH (ref 65–99)

## 2015-12-14 LAB — COMPREHENSIVE METABOLIC PANEL
ALBUMIN: 2.9 g/dL — AB (ref 3.5–5.0)
ALT: 11 U/L — AB (ref 14–54)
AST: 23 U/L (ref 15–41)
Alkaline Phosphatase: 102 U/L (ref 38–126)
Anion gap: 10 (ref 5–15)
BUN: 18 mg/dL (ref 6–20)
CHLORIDE: 88 mmol/L — AB (ref 101–111)
CO2: 35 mmol/L — AB (ref 22–32)
CREATININE: 1.31 mg/dL — AB (ref 0.44–1.00)
Calcium: 9 mg/dL (ref 8.9–10.3)
GFR calc non Af Amer: 42 mL/min — ABNORMAL LOW (ref >60)
GFR, EST AFRICAN AMERICAN: 49 mL/min — AB (ref >60)
GLUCOSE: 315 mg/dL — AB (ref 65–99)
Potassium: 3.7 mmol/L (ref 3.5–5.1)
SODIUM: 133 mmol/L — AB (ref 135–145)
Total Bilirubin: 1.6 mg/dL — ABNORMAL HIGH (ref 0.3–1.2)
Total Protein: 7 g/dL (ref 6.5–8.1)

## 2015-12-14 LAB — CARBOXYHEMOGLOBIN
Carboxyhemoglobin: 1.9 % — ABNORMAL HIGH (ref 0.5–1.5)
METHEMOGLOBIN: 0.6 % (ref 0.0–1.5)
O2 Saturation: 66 %
TOTAL HEMOGLOBIN: 12.3 g/dL (ref 12.0–16.0)

## 2015-12-14 MED ORDER — METOLAZONE 5 MG PO TABS
5.0000 mg | ORAL_TABLET | Freq: Two times a day (BID) | ORAL | Status: DC
  Administered 2015-12-14 – 2015-12-21 (×15): 5 mg via ORAL
  Filled 2015-12-14 (×16): qty 1

## 2015-12-14 MED ORDER — POTASSIUM CHLORIDE CRYS ER 20 MEQ PO TBCR
20.0000 meq | EXTENDED_RELEASE_TABLET | Freq: Once | ORAL | Status: AC
  Administered 2015-12-14: 20 meq via ORAL
  Filled 2015-12-14: qty 1

## 2015-12-14 MED ORDER — LEVALBUTEROL HCL 0.63 MG/3ML IN NEBU
0.6300 mg | INHALATION_SOLUTION | RESPIRATORY_TRACT | Status: DC | PRN
Start: 2015-12-14 — End: 2015-12-28

## 2015-12-14 NOTE — Progress Notes (Signed)
Spoke w pt. She had prim phy but has not seen since fall. No ins at present. meds are hard for her and family to afford. Spoke w [facility] to see if they can assist w her disabiltiy process. Pt states she has applied for disability. Pt agreeable to getting appt w [facility] since no ins at present and paying out of pocket for office visits. [facility] appt on 2-6 at 12noon. Gave pt  [place] pt assist form .

## 2015-12-14 NOTE — Progress Notes (Signed)
Advanced Heart Failure Rounding Note  PCP: Dr. Sanda Linger Primary Cardiologist: Dr Myrtis Ser  Subjective:    Admitted 12/08/15 with 2 weeks of progressive SOB, LE edema, and a 40 lb weight gain. She increased her torsemide x 3 days with no improvement.   Weight 267 lbs 07/25/15, Weight 293 lbs today.  R/LHC as below consistent with volume overload and pulmonary hypertension.  Out >3L x 24 hrs, though weight only down 2 lbs. Creatinine stable 1.32 => 1.3. K 3.7. CVP 23-24 on my check.   States she feels much better this morning. Breathing improving.  Has [place] off, dries out her nose.  Denies CP, lightheadedness, dizziness, or palpitations.  Still has a lot of swelling.  Willing to stay as long as it takes. "Wants to be well"  Objective:   Weight Range: 287 lb 3.2 oz (130.273 kg) Body mass index is 44.97 kg/(m^2).   Vital Signs:   Temp:  [98 F (36.7 C)-98.5 F (36.9 C)] 98.5 F (36.9 C) (02/02 0400) Pulse Rate:  [63-252] 65 (02/02 0600) Resp:  [14-31] 31 (02/02 0600) BP: (94-132)/(56-85) 102/63 mmHg (02/02 0600) SpO2:  [2 %-99 %] 95 % (02/02 0600) Weight:  [287 lb 3.2 oz (130.273 kg)-293 lb 9.6 oz (133.176 kg)] 287 lb 3.2 oz (130.273 kg) (02/02 0500) Last BM Date: 12/10/15  Weight change: Filed Weights   12/13/15 0725 12/13/15 1400 12/14/15 0500  Weight: 293 lb 9.6 oz (133.176 kg) 289 lb 3.9 oz (131.2 kg) 287 lb 3.2 oz (130.273 kg)    Intake/Output:   Intake/Output Summary (Last 24 hours) at 12/14/15 0706 Last data filed at 12/14/15 0600  Gross per 24 hour  Intake    930 ml  Output   4025 ml  Net  -3095 ml     Physical Exam: CVP 23-24 General:  Elderly appearing, NAD HEENT: 02 via [place]ck: supple. JVP difficult to assess, thick. Carotids 2+ bilat; no bruits. No thyromegaly or nodule noted. Cor: PMI nondisplaced. Distant, RRR. No rubs, gallops or murmurs. Lungs: Diminished throughout, mild basilar crackles. Abdomen: Morbidly obese, soft, NT, ND, no HSM. No bruits or  masses. +BS. Edematous into flanks. Extremities: no cyanosis, clubbing, rash. 4+ edema into thighs/flank. Neuro: alert & orientedx3, cranial nerves grossly intact. moves all 4 extremities w/o difficulty. Affect pleasant  Telemetry: Reviewed personally, NSR 70s, Occasional PVCs  Labs: CBC  Recent Labs  12/13/15 0615 12/13/15 1401  WBC 5.3 5.2  HGB 12.3 12.8  HCT 38.6 39.4  MCV 92.1 91.4  PLT 276 278   Basic Metabolic Panel  Recent Labs  12/12/15 0615 12/13/15 0615 12/13/15 1401 12/14/15 0427  NA  --  137  --  133*  K  --  3.7  --  3.7  CL  --  92*  --  88*  CO2  --  34*  --  35*  GLUCOSE  --  154*  --  315*  BUN  --  23*  --  18  CREATININE  --  1.32* 1.32* 1.31*  CALCIUM  --  9.2  --  9.0  MG 2.2  --   --   --    Liver Function Tests  Recent Labs  12/14/15 0427  AST 23  ALT 11*  ALKPHOS 102  BILITOT 1.6*  PROT 7.0  ALBUMIN 2.9*   No results for input(s): LIPASE, AMYLASE in the last 72 hours. Cardiac Enzymes No results for input(s): CKTOTAL, CKMB, CKMBINDEX, TROPONINI in the last 72  hours.  BNP: BNP (last 3 results)  Recent Labs  05/29/15 1608 06/04/15 0945 12/08/15 0922  BNP 921.7* 340.1* 1155.5*    ProBNP (last 3 results) No results for input(s): PROBNP in the last 8760 hours.   D-Dimer No results for input(s): DDIMER in the last 72 hours. Hemoglobin A1C No results for input(s): HGBA1C in the last 72 hours. Fasting Lipid Panel No results for input(s): CHOL, HDL, LDLCALC, TRIG, CHOLHDL, LDLDIRECT in the last 72 hours. Thyroid Function Tests No results for input(s): TSH, T4TOTAL, T3FREE, THYROIDAB in the last 72 hours.  Invalid input(s): FREET3  Other results:     Imaging/Studies:  No results found.  Latest Echo  Latest Cath   Medications:     Scheduled Medications: . antiseptic oral rinse  7 mL Mouth Rinse BID  . aspirin  81 mg Oral Daily  . carvedilol  3.125 mg Oral BID WC  . enoxaparin (LOVENOX) injection  65 mg  Subcutaneous Q24H  . insulin aspart  0-5 Units Subcutaneous QHS  . insulin aspart  0-9 Units Subcutaneous TID WC  . isosorbide-hydrALAZINE  1 tablet Oral BID  . levalbuterol  0.63 mg Nebulization TID  . metolazone  2.5 mg Oral BID  . multivitamin with minerals  1 tablet Oral Daily  . potassium chloride  40 mEq Oral TID  . sodium chloride flush  10-40 mL Intracatheter Q12H  . sodium chloride flush  3 mL Intravenous Q12H  . sodium chloride flush  3 mL Intravenous Q12H  . sodium chloride flush  3 mL Intravenous Q12H  . sodium chloride flush  3 mL Intravenous Q12H    Infusions: . furosemide (LASIX) infusion 30 mg/hr (12/13/15 2145)    PRN Medications: sodium chloride, sodium chloride, sodium chloride, acetaminophen **OR** acetaminophen, alum & mag hydroxide-simeth, guaiFENesin, ondansetron **OR** ondansetron (ZOFRAN) IV, oxyCODONE, polyethylene glycol, sodium chloride flush, sodium chloride flush, sodium chloride flush, sodium chloride flush   Assessment   1. Acute on chronic CHF - NYHA Class IV - EF 15-20% 2. CAD s/p CABG x 4 2012  3. Recent NSTEMI 4. NSVT 5. Morbid Obesity  Plan    CVP 23-24 on my check this am. She remains markedly volume overload with edema up into her abdomen/flanks. Continue lasix gtt at 30 mg/hr and metolazone 2.5 mg BID.   BP soft this am. No up-titration or added meds. Currently on Bidil 1 tab BID and Coreg 3.125 BID.  Responding well to lasix thus far. Supp K for goal of > 4.0. Monitor closely with aggressive diuresis.  Needs to be worked up for sleep apnea. Will start with Nocturnal oximetry tonight. Though may need to wait until she is better diuresed for more clear results.   Length of Stay: 6  [name] PA-C 12/14/2015, 7:06 AM  Advanced Heart Failure Team Pager [phone] (M-F; 7a - 4p)  Please contact [facility] for night-coverage after hours (4p -7a ) and weekends on [email or web address]  Patient seen and examined with Otilio Saber,  PA-C. We discussed all aspects of the encounter. I agree with the assessment and plan as stated above.   Respiratory status improving. She remains massively volume overloaded. CVP 23. Co-ox ok. Now on high dose lasix gtt with moderate urine output. Will add metolazone 5 bid. Watch electrolytes and renal function closely. Supp K+. Will also add spiro.    Courtnee Myer,MD 2:50 PM

## 2015-12-14 NOTE — Progress Notes (Deleted)
Subjective: No acute events overnight. UOP 4 L over past 24 hours while on Lasix gtt. Cr holding steady at 1.3. Her weight is down 21 lbs since admission. She had a cardiac cath yesterday which showed severe 3 vessel disease (prox LAD 100%, prox Cx 99%, Mid RCA 80%, Dist RCA 100%) and severe pulmonary HTN (PAP 86 mmHg).   She reports shortness of breath and coughing "spells" at night. She still has trouble lying flat. She reports feeling mildly better than when she came in. She denies any chest pain.   Objective: Vital signs in last 24 hours: Temp:  [98 F (36.7 C)-98.5 F (36.9 C)] 98.5 F (36.9 C) (02/02 0400) Pulse Rate:  [63-252] 67 (02/02 0742) Resp:  [14-31] 18 (02/02 0742) BP: (94-132)/(56-85) 106/72 mmHg (02/02 0742) SpO2:  [2 %-99 %] 93 % (02/02 0742) Weight:  [287 lb 3.2 oz (130.273 kg)-289 lb 3.9 oz (131.2 kg)] 287 lb 3.2 oz (130.273 kg) (02/02 0500) Weight change:  Last BM Date: 12/12/15 Intake/Output from previous day: -3305 02/01 0701 - 02/02 0700 In: 930 [P.O.:480; I.V.:450] Out: 4025 [Urine:4025] Intake/Output this shift:    Physical Exam:  General: Morbidly obese woman resting in bed, pleasant, NAD HEENT: [place]/AT, EOMI, sclera anicteric, mucus membranes moist Neck: JVD+ Heart: RRR, no m/g/r Lungs: crackles at bases, breaths non-labored on room air Abd: BS+, soft, obese, non-tender Ext: 3-4+ lower ext edema to her hips,  2+ radial pulses Neuro: alert and oriented X 3  Tele: Still with a few brief episodes of NSVT    Lab Results:  Recent Labs  12/13/15 0615 12/13/15 1401  WBC 5.3 5.2  HGB 12.3 12.8  HCT 38.6 39.4  PLT 276 278   BMET  Recent Labs  12/13/15 0615 12/13/15 1401 12/14/15 0427  NA 137  --  133*  K 3.7  --  3.7  CL 92*  --  88*  CO2 34*  --  35*  GLUCOSE 154*  --  315*  BUN 23*  --  18  CREATININE 1.32* 1.32* 1.31*  CALCIUM 9.2  --  9.0    Lab Results  Component Value Date   CHOL 136 12/09/2015   HDL 26*  12/09/2015   LDLCALC 94 12/09/2015   TRIG 82 12/09/2015   CHOLHDL 5.2 12/09/2015   Lab Results  Component Value Date   HGBA1C 8.3* 12/09/2015     Lab Results  Component Value Date   TSH 3.253 12/08/2015     Medications: I have reviewed the patient's current medications. Scheduled Meds: . antiseptic oral rinse  7 mL Mouth Rinse BID  . aspirin  81 mg Oral Daily  . carvedilol  3.125 mg Oral BID WC  . enoxaparin (LOVENOX) injection  65 mg Subcutaneous Q24H  . insulin aspart  0-5 Units Subcutaneous QHS  . insulin aspart  0-9 Units Subcutaneous TID WC  . isosorbide-hydrALAZINE  1 tablet Oral BID  . levalbuterol  0.63 mg Nebulization TID  . metolazone  2.5 mg Oral BID  . multivitamin with minerals  1 tablet Oral Daily  . potassium chloride  20 mEq Oral Once  . potassium chloride  40 mEq Oral TID  . sodium chloride flush  10-40 mL Intracatheter Q12H  . sodium chloride flush  3 mL Intravenous Q12H  . sodium chloride flush  3 mL Intravenous Q12H  . sodium chloride flush  3 mL Intravenous Q12H  . sodium chloride flush  3 mL  Intravenous Q12H   Continuous Infusions: . furosemide (LASIX) infusion 30 mg/hr (12/13/15 2145)   PRN Meds:.sodium chloride, sodium chloride, sodium chloride, acetaminophen **OR** acetaminophen, alum & mag hydroxide-simeth, guaiFENesin, ondansetron **OR** ondansetron (ZOFRAN) IV, oxyCODONE, polyethylene glycol, sodium chloride flush, sodium chloride flush, sodium chloride flush, sodium chloride flush  Assessment/Plan:  Acute on Chronic Congestive Systolic Heart Failure: NYHA class IV - EF now 15-20%. Cardiac cath yesterday revealing severe 3 vessel disease and severe pulmonary HTN. She had excellent diuresis overnight with 4 L UOP. She has put out 13.7 L since admission. Weight is down 21 lbs. Cr holding steady at 1.31 (Cr 1.45 on admission). She remains significantly volume overloaded so will continue diuresis with Lasix gtt and Metolazone 2.5 mg BID per HF team.    - Continue Lasix gtt and Metolazone - Would continue to hold ARB as BPs in 90s-100s systolic and Cr still elevated - Continue potassium supplementation.  - Continue Coreg 3.125 mg BID - Continue Bidil 20-37.5 mg BID - Appreciate HF team following   Morbid obesity: Agree with HF that she needs to be worked up for sleep apnea given her morbid obesity and severe pulmonary HTN. This can be done as an outpatient. Rest per IM.   Uncontrolled type 2 diabetes: A1c 8.3. Blood sugars doing well in 130s-180s. Continue sensitive ISS.   Prior CABG 4 2012  Recent NSTEMI in July 2016 - refused cath at that time, but had cath performed yesterday (2/1)  NSVT-brief runs. Secondary to her systolic CHF      Cardiomyopathy with EF now 15-20% down from 30-35% in 03/2015.      Hyperlipidemia: LDL 94. Goal should be <70 for patients with diabetes. Can add on statin.    Attending note to follow.     LOS: 6 days   Rich Number, MD, MPH Internal Medicine Resident, PGY-II Pager: [phone] 12/14/2015, 7:53 AM

## 2015-12-15 LAB — BASIC METABOLIC PANEL
Anion gap: 11 (ref 5–15)
BUN: 19 mg/dL (ref 6–20)
CALCIUM: 9 mg/dL (ref 8.9–10.3)
CHLORIDE: 83 mmol/L — AB (ref 101–111)
CO2: 37 mmol/L — AB (ref 22–32)
CREATININE: 1.42 mg/dL — AB (ref 0.44–1.00)
GFR calc non Af Amer: 38 mL/min — ABNORMAL LOW (ref >60)
GFR, EST AFRICAN AMERICAN: 44 mL/min — AB (ref >60)
GLUCOSE: 264 mg/dL — AB (ref 65–99)
Potassium: 3.6 mmol/L (ref 3.5–5.1)
Sodium: 131 mmol/L — ABNORMAL LOW (ref 135–145)

## 2015-12-15 LAB — GLUCOSE, CAPILLARY
GLUCOSE-CAPILLARY: 139 mg/dL — AB (ref 65–99)
GLUCOSE-CAPILLARY: 171 mg/dL — AB (ref 65–99)
Glucose-Capillary: 183 mg/dL — ABNORMAL HIGH (ref 65–99)
Glucose-Capillary: 170 mg/dL — ABNORMAL HIGH (ref 65–99)

## 2015-12-15 LAB — CARBOXYHEMOGLOBIN
CARBOXYHEMOGLOBIN: 1.7 % — AB (ref 0.5–1.5)
Methemoglobin: 0.7 % (ref 0.0–1.5)
O2 SAT: 72.3 %
TOTAL HEMOGLOBIN: 14.5 g/dL (ref 12.0–16.0)

## 2015-12-15 MED ORDER — SPIRONOLACTONE 25 MG PO TABS
12.5000 mg | ORAL_TABLET | Freq: Every day | ORAL | Status: DC
  Administered 2015-12-15: 12.5 mg via ORAL
  Filled 2015-12-15: qty 1

## 2015-12-15 MED ORDER — TRAMADOL HCL 50 MG PO TABS
50.0000 mg | ORAL_TABLET | Freq: Four times a day (QID) | ORAL | Status: DC | PRN
  Administered 2015-12-15 – 2015-12-19 (×3): 50 mg via ORAL
  Filled 2015-12-15 (×4): qty 1

## 2015-12-15 MED ORDER — POTASSIUM CHLORIDE CRYS ER 20 MEQ PO TBCR
20.0000 meq | EXTENDED_RELEASE_TABLET | Freq: Once | ORAL | Status: AC
  Administered 2015-12-15: 20 meq via ORAL
  Filled 2015-12-15: qty 1

## 2015-12-15 MED ORDER — MILRINONE IN DEXTROSE 20 MG/100ML IV SOLN
0.1250 ug/kg/min | INTRAVENOUS | Status: DC
  Administered 2015-12-15 – 2015-12-22 (×9): 0.125 ug/kg/min via INTRAVENOUS
  Filled 2015-12-15 (×9): qty 100

## 2015-12-15 MED ORDER — INSULIN ASPART 100 UNIT/ML ~~LOC~~ SOLN
0.0000 [IU] | Freq: Three times a day (TID) | SUBCUTANEOUS | Status: DC
  Administered 2015-12-15: 3 [IU] via SUBCUTANEOUS
  Administered 2015-12-15: 5 [IU] via SUBCUTANEOUS
  Administered 2015-12-16 (×2): 3 [IU] via SUBCUTANEOUS
  Administered 2015-12-16: 2 [IU] via SUBCUTANEOUS
  Administered 2015-12-17: 3 [IU] via SUBCUTANEOUS
  Administered 2015-12-17 (×2): 2 [IU] via SUBCUTANEOUS
  Administered 2015-12-18: 5 [IU] via SUBCUTANEOUS
  Administered 2015-12-18: 3 [IU] via SUBCUTANEOUS
  Administered 2015-12-18 – 2015-12-19 (×4): 5 [IU] via SUBCUTANEOUS
  Administered 2015-12-20 (×2): 3 [IU] via SUBCUTANEOUS
  Administered 2015-12-20: 5 [IU] via SUBCUTANEOUS
  Administered 2015-12-21: 3 [IU] via SUBCUTANEOUS
  Administered 2015-12-21 – 2015-12-23 (×7): 5 [IU] via SUBCUTANEOUS
  Administered 2015-12-23: 3 [IU] via SUBCUTANEOUS
  Administered 2015-12-24 (×2): 5 [IU] via SUBCUTANEOUS
  Administered 2015-12-24: 3 [IU] via SUBCUTANEOUS
  Administered 2015-12-25: 5 [IU] via SUBCUTANEOUS
  Administered 2015-12-25 (×2): 3 [IU] via SUBCUTANEOUS
  Administered 2015-12-26: 8 [IU] via SUBCUTANEOUS
  Administered 2015-12-26: 2 [IU] via SUBCUTANEOUS
  Administered 2015-12-26: 5 [IU] via SUBCUTANEOUS
  Administered 2015-12-27: 3 [IU] via SUBCUTANEOUS
  Administered 2015-12-27 (×2): 5 [IU] via SUBCUTANEOUS
  Administered 2015-12-28 (×2): 3 [IU] via SUBCUTANEOUS

## 2015-12-15 NOTE — Progress Notes (Signed)
Advanced Heart Failure Rounding Note  PCP: Dr. Sanda Knight Primary Cardiologist: Dr Christine Knight  Subjective:    Admitted 12/08/15 with 2 weeks of progressive SOB, LE edema, and a 40 lb weight gain. She increased her torsemide x 3 days with no improvement.   Weight 267 lbs 07/25/15, Weight 293 lbs today.  R/LHC as below consistent with volume overload and pulmonary hypertension.  Again out 3 L and down 2 lbs. (confirmed weight done standing) Creatinine relatively stable 1.32 => 1.3 => 1.42. K 3.6. CVP 19-20 on my check though waveform dipping below 15. May be closer to 17-18.  Coox stable (72%) without any inotropic support.  Breathing OK this morning. Still feels bad and tired.  No CP or lightheadedness/dizziness. Sleepy this morning, just waking up.   Objective:   Weight Range: 285 lb 7.9 oz (129.5 kg) Body mass index is 44.7 kg/(m^2).   Vital Signs:   Temp:  [98 F (36.7 C)-98.4 F (36.9 C)] 98.4 F (36.9 C) (02/03 0400) Pulse Rate:  [62-77] 68 (02/03 0400) Resp:  [15-33] 19 (02/03 0600) BP: (92-110)/(58-81) 101/63 mmHg (02/03 0600) SpO2:  [89 %-100 %] 100 % (02/03 0600) Weight:  [285 lb 7.9 oz (129.5 kg)] 285 lb 7.9 oz (129.5 kg) (02/03 0500) Last BM Date: 12/12/15  Weight change: Filed Weights   12/13/15 1400 12/14/15 0500 12/15/15 0500  Weight: 289 lb 3.9 oz (131.2 kg) 287 lb 3.2 oz (130.273 kg) 285 lb 7.9 oz (129.5 kg)    Intake/Output:   Intake/Output Summary (Last 24 hours) at 12/15/15 0726 Last data filed at 12/15/15 0700  Gross per 24 hour  Intake    930 ml  Output   3520 ml  Net  -2590 ml     Physical Exam: CVP 19-20 General:  Elderly appearing, NAD HEENT: 02 via [place] Neck: supple. JVP difficult to assess, thick. Carotids 2+ bilat; no bruits. No thyromegaly or nodule noted. Cor: PMI nondisplaced. Distant, RRR. No rubs, gallops or murmurs. Lungs: mild crackles at bases, otherwise clear. Abdomen: Morbidly obese, soft, NT, ND, no HSM. No bruits or masses. +BS.  Remains edematous into flanks. Extremities: no cyanosis, clubbing, rash. 3-4+ edema into thighs/flank. Neuro: alert & orientedx3, cranial nerves grossly intact. moves all 4 extremities w/o difficulty. Affect pleasant  Telemetry: Reviewed personally, NSR 70s, Occasional PVCs  Labs: CBC  Recent Labs  12/13/15 0615 12/13/15 1401  WBC 5.3 5.2  HGB 12.3 12.8  HCT 38.6 39.4  MCV 92.1 91.4  PLT 276 278   Basic Metabolic Panel  Recent Labs  12/14/15 0427 12/15/15 0456  NA 133* 131*  K 3.7 3.6  CL 88* 83*  CO2 35* 37*  GLUCOSE 315* 264*  BUN 18 19  CREATININE 1.31* 1.42*  CALCIUM 9.0 9.0   Liver Function Tests  Recent Labs  12/14/15 0427  AST 23  ALT 11*  ALKPHOS 102  BILITOT 1.6*  PROT 7.0  ALBUMIN 2.9*   No results for input(s): LIPASE, AMYLASE in the last 72 hours. Cardiac Enzymes No results for input(s): CKTOTAL, CKMB, CKMBINDEX, TROPONINI in the last 72 hours.  BNP: BNP (last 3 results)  Recent Labs  05/29/15 1608 06/04/15 0945 12/08/15 0922  BNP 921.7* 340.1* 1155.5*    ProBNP (last 3 results) No results for input(s): PROBNP in the last 8760 hours.   D-Dimer No results for input(s): DDIMER in the last 72 hours. Hemoglobin A1C No results for input(s): HGBA1C in the last 72 hours. Fasting Lipid Panel  No results for input(s): CHOL, HDL, LDLCALC, TRIG, CHOLHDL, LDLDIRECT in the last 72 hours. Thyroid Function Tests No results for input(s): TSH, T4TOTAL, T3FREE, THYROIDAB in the last 72 hours.  Invalid input(s): FREET3  Other results:     Imaging/Studies:  No results found.  Latest Echo  Latest Cath   Medications:     Scheduled Medications: . antiseptic oral rinse  7 mL Mouth Rinse BID  . aspirin  81 mg Oral Daily  . carvedilol  3.125 mg Oral BID WC  . enoxaparin (LOVENOX) injection  65 mg Subcutaneous Q24H  . insulin aspart  0-5 Units Subcutaneous QHS  . insulin aspart  0-9 Units Subcutaneous TID WC  . isosorbide-hydrALAZINE   1 tablet Oral BID  . metolazone  5 mg Oral BID  . multivitamin with minerals  1 tablet Oral Daily  . potassium chloride  40 mEq Oral TID  . sodium chloride flush  10-40 mL Intracatheter Q12H    Infusions: . furosemide (LASIX) infusion 30 mg/hr (12/15/15 0102)    PRN Medications: acetaminophen **OR** acetaminophen, alum & mag hydroxide-simeth, guaiFENesin, levalbuterol, ondansetron **OR** ondansetron (ZOFRAN) IV, oxyCODONE, polyethylene glycol   Assessment   1. Acute on chronic CHF - NYHA Class IV - EF 15-20% 2. CAD s/p CABG x 4 2012  3. Recent NSTEMI 4. NSVT 5. Morbid Obesity  Plan    CVP 19-20. She remains markedly volume overloaded.  Will continue lasix gtt at 30 mg/hr and metolazone 5 mg BID. Starting 12.5 spiro today, will repeat K supp from yesterday. Watch creatinine closely with aggressive diuresis. Relatively stable thus far 1.31 => 1.42.  Albumin 2.9 12/14/15  BP remains soft. No room to up-titrate with adding Cleda Daub.  Continue Bidil 1 tab BID and Coreg 3.125 BID.   Can get nocturnal oximetry for evaluation of sleep apnea.   Dispo: She will likely be here for 5-7 more days at the least.   Can likely move to stepdown.   Length of Stay: 7  Christine Freer PA-C 12/15/2015, 7:26 AM  Advanced Heart Failure Team Pager [phone] (M-F; 7a - 4p)  Please contact [facility] for night-coverage after hours (4p -7a ) and weekends on [email or web address]  Patient seen and examined with Christine Saber, PA-C. We discussed all aspects of the encounter. I agree with the assessment and plan as stated above.   She remains markedly volume overloaded. She is on high dose diuretics and diuresing slowly. Creatinine up a bit.Supp K+.  Despite co-ox i think she would benefit from a brief course of milrinone to support diuresis as she has severe LV dysfunction and marked overload.   Knight, Daniel,MD 11:38 AM

## 2015-12-15 NOTE — Progress Notes (Signed)
Results for CLYDIA, NIEVES (MRN [id number]) as of 12/15/2015 08:48  Ref. Range 12/14/2015 11:39 12/14/2015 17:53 12/14/2015 21:52  Glucose-Capillary Latest Ref Range: 65-99 mg/dL 409 (H) 811 (H) 914 (H)  Noted that blood sugars are trending greater than 180 mg/dl. Last two blood sugars have been 293, 264 mg/dl. Recommend increasing Novolog correction scale to MODERATE TID & HS. Will continue to monitor while in the hospital. Smith Mince RN BSN CDE

## 2015-12-15 NOTE — Progress Notes (Signed)
Pt on lasix drip, will not be out of hospital by [facility] appt on 2-6. Have resched for 2-10 at 11am. They will only sched 5 days out so if not out will resched as indicated.

## 2015-12-16 LAB — BASIC METABOLIC PANEL
ANION GAP: 12 (ref 5–15)
BUN: 21 mg/dL — ABNORMAL HIGH (ref 6–20)
CHLORIDE: 83 mmol/L — AB (ref 101–111)
CO2: 38 mmol/L — AB (ref 22–32)
Calcium: 9.4 mg/dL (ref 8.9–10.3)
Creatinine, Ser: 1.47 mg/dL — ABNORMAL HIGH (ref 0.44–1.00)
GFR calc non Af Amer: 37 mL/min — ABNORMAL LOW (ref >60)
GFR, EST AFRICAN AMERICAN: 42 mL/min — AB (ref >60)
Glucose, Bld: 179 mg/dL — ABNORMAL HIGH (ref 65–99)
Potassium: 4.1 mmol/L (ref 3.5–5.1)
Sodium: 133 mmol/L — ABNORMAL LOW (ref 135–145)

## 2015-12-16 LAB — GLUCOSE, CAPILLARY
GLUCOSE-CAPILLARY: 198 mg/dL — AB (ref 65–99)
GLUCOSE-CAPILLARY: 140 mg/dL — AB (ref 65–99)
GLUCOSE-CAPILLARY: 130 mg/dL — AB (ref 65–99)
Glucose-Capillary: 162 mg/dL — ABNORMAL HIGH (ref 65–99)

## 2015-12-16 LAB — CARBOXYHEMOGLOBIN
Carboxyhemoglobin: 1.7 % — ABNORMAL HIGH (ref 0.5–1.5)
Methemoglobin: 0.7 % (ref 0.0–1.5)
O2 Saturation: 73.2 %
Total hemoglobin: 12.1 g/dL (ref 12.0–16.0)

## 2015-12-16 MED ORDER — SPIRONOLACTONE 25 MG PO TABS
25.0000 mg | ORAL_TABLET | Freq: Every day | ORAL | Status: DC
  Administered 2015-12-16 – 2015-12-24 (×9): 25 mg via ORAL
  Filled 2015-12-16 (×9): qty 1

## 2015-12-16 MED ORDER — ACETAZOLAMIDE 250 MG PO TABS
250.0000 mg | ORAL_TABLET | Freq: Two times a day (BID) | ORAL | Status: DC
  Administered 2015-12-16 – 2015-12-22 (×13): 250 mg via ORAL
  Filled 2015-12-16 (×15): qty 1

## 2015-12-16 NOTE — Progress Notes (Signed)
CARDIAC REHAB PHASE I   Attempted to ambulate with pt. Pt up in recliner, states she has been walking to the bathroom with assistance. Pt states she is having an "arthritis flare" and just received pain medication which has made her "groggy." Pt declines ambulation at this time, states "I just can't." RN aware. Encouraged pt to ambulate with nursing staff tomorrow as able.  Pt in recliner, call bell within reach. Will follow-up Monday.  Joylene Grapes, RN, BSN 12/16/2015 2:38 PM

## 2015-12-16 NOTE — Progress Notes (Signed)
Patient ID: Christine Knight, female   DOB: [birth year], 65 y.o.   MRN: [id number]     Advanced Heart Failure Rounding Note  PCP: Dr. Sanda Linger Primary Cardiologist: Dr Myrtis Ser  Subjective:    Admitted 12/08/15 with 2 weeks of progressive SOB, LE edema, and a 40 lb weight gain. She increased her torsemide x 3 days with no improvement.   Weight 267 lbs 07/25/15, Weight 293 lbs today.  R/LHC as below consistent with volume overload and pulmonary hypertension.  Echo (1/17) with EF 15-20%, moderate RV systolic dysfunction.   RHC/LHC:  - Patent bypass grafts, severe native vessel disease. RA mean 23 PA 86/34, mean 56 PCWP mean 37 CI 2.4 PVR 3.2 WU  Milrinone 0.125 added 2/3 to help with diuresis.   Diuresis not particularly impressive given very aggressive diuretics, net negative 1.7 L.  Weighed in bed today so not accurate.  CVP 20 today with co-ox 73%.   Breathing OK this morning. Feels fatigued.  No CP or lightheadedness/dizziness.    Objective:   Weight Range: 287 lb 14.4 oz (130.591 kg) Body mass index is 45.08 kg/(m^2).   Vital Signs:   Temp:  [98.1 F (36.7 C)-98.2 F (36.8 C)] 98.2 F (36.8 C) (02/04 0400) Resp:  [16-30] 21 (02/04 0600) BP: (86-117)/(56-100) 109/67 mmHg (02/04 0600) SpO2:  [92 %-98 %] 97 % (02/04 0600) Weight:  [287 lb 14.4 oz (130.591 kg)] 287 lb 14.4 oz (130.591 kg) (02/04 0400) Last BM Date: 12/12/15  Weight change: Filed Weights   12/14/15 0500 12/15/15 0500 12/16/15 0400  Weight: 287 lb 3.2 oz (130.273 kg) 285 lb 7.9 oz (129.5 kg) 287 lb 14.4 oz (130.591 kg)    Intake/Output:   Intake/Output Summary (Last 24 hours) at 12/16/15 0842 Last data filed at 12/16/15 0600  Gross per 24 hour  Intake  993.1 ml  Output   2615 ml  Net -1621.9 ml     Physical Exam: CVP 20 General:  Elderly appearing, NAD, obese.  HEENT: 02 via [place] Neck: supple. JVP to jaw. Carotids 2+ bilat; no bruits. No thyromegaly or nodule noted. Cor: PMI nondisplaced.  Distant, RRR. No rubs, gallops or murmurs. Lungs: mild crackles at bases, otherwise clear. Abdomen: Morbidly obese, soft, NT, ND, no HSM. No bruits or masses. +BS. Remains edematous into flanks. Extremities: no cyanosis, clubbing, rash. 2+ edema to thighs. Neuro: alert & orientedx3, cranial nerves grossly intact. moves all 4 extremities w/o difficulty. Affect pleasant  Telemetry: Reviewed personally, NSR 70s, Occasional PVCs  Labs: CBC  Recent Labs  12/13/15 1401  WBC 5.2  HGB 12.8  HCT 39.4  MCV 91.4  PLT 278   Basic Metabolic Panel  Recent Labs  12/15/15 0456 12/16/15 0405  NA 131* 133*  K 3.6 4.1  CL 83* 83*  CO2 37* 38*  GLUCOSE 264* 179*  BUN 19 21*  CREATININE 1.42* 1.47*  CALCIUM 9.0 9.4   Liver Function Tests  Recent Labs  12/14/15 0427  AST 23  ALT 11*  ALKPHOS 102  BILITOT 1.6*  PROT 7.0  ALBUMIN 2.9*   No results for input(s): LIPASE, AMYLASE in the last 72 hours. Cardiac Enzymes No results for input(s): CKTOTAL, CKMB, CKMBINDEX, TROPONINI in the last 72 hours.  BNP: BNP (last 3 results)  Recent Labs  05/29/15 1608 06/04/15 0945 12/08/15 0922  BNP 921.7* 340.1* 1155.5*    ProBNP (last 3 results) No results for input(s): PROBNP in the last 8760 hours.   D-Dimer No results for  input(s): DDIMER in the last 72 hours. Hemoglobin A1C No results for input(s): HGBA1C in the last 72 hours. Fasting Lipid Panel No results for input(s): CHOL, HDL, LDLCALC, TRIG, CHOLHDL, LDLDIRECT in the last 72 hours. Thyroid Function Tests No results for input(s): TSH, T4TOTAL, T3FREE, THYROIDAB in the last 72 hours.  Invalid input(s): FREET3   Medications:     Scheduled Medications: . acetaZOLAMIDE  250 mg Oral BID  . antiseptic oral rinse  7 mL Mouth Rinse BID  . aspirin  81 mg Oral Daily  . carvedilol  3.125 mg Oral BID WC  . enoxaparin (LOVENOX) injection  65 mg Subcutaneous Q24H  . insulin aspart  0-15 Units Subcutaneous TID WC  . insulin  aspart  0-5 Units Subcutaneous QHS  . isosorbide-hydrALAZINE  1 tablet Oral BID  . metolazone  5 mg Oral BID  . multivitamin with minerals  1 tablet Oral Daily  . potassium chloride  40 mEq Oral TID  . sodium chloride flush  10-40 mL Intracatheter Q12H  . spironolactone  25 mg Oral Daily    Infusions: . furosemide (LASIX) infusion 30 mg/hr (12/16/15 0514)  . milrinone 0.125 mcg/kg/min (12/16/15 0514)    PRN Medications: acetaminophen **OR** acetaminophen, alum & mag hydroxide-simeth, guaiFENesin, levalbuterol, ondansetron **OR** ondansetron (ZOFRAN) IV, oxyCODONE, polyethylene glycol, traMADol   Assessment   1. Acute on chronic CHF: Ischemic cardiomyopathy. NYHA Class IV.  Echo (1/17): EF 15-20% with moderate RV systolic dysfunction.  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  2. CAD s/p CABG x 4 2012: Angiography this admission with patent grafts, severe native vessel disease. 3. NSVT 4. Morbid Obesity 5. CKD  Plan    CVP 20. She remains markedly volume overloaded.  Some diuresis but not marked given the diuretic doses she is getting.  Needs standing weight this morning. On milrinone now to augment diuresis, co-ox 73%.  - Continue milrinone 0.125  - Continue Lasix 30 mg/hr and metolazone 5 bid.  - HCO3 38, will add acetazolamide 250 mg bid.  - Increase spironolactone to 25 mg daily.   Creatinine remains stable with diuresis.   BP remains soft.  Will continue current Bidil and Coreg.   Walk with cardiac rehab.   Length of Stay: 8  Christine Knight Shirlee Latch 12/16/2015, 8:42 AM

## 2015-12-17 LAB — CARBOXYHEMOGLOBIN
CARBOXYHEMOGLOBIN: 1.9 % — AB (ref 0.5–1.5)
Methemoglobin: 0.8 % (ref 0.0–1.5)
O2 SAT: 66.5 %
TOTAL HEMOGLOBIN: 10.6 g/dL — AB (ref 12.0–16.0)

## 2015-12-17 LAB — BASIC METABOLIC PANEL
Anion gap: 14 (ref 5–15)
Anion gap: 12 (ref 5–15)
BUN: 26 mg/dL — AB (ref 6–20)
BUN: 25 mg/dL — AB (ref 6–20)
CALCIUM: 9.2 mg/dL (ref 8.9–10.3)
CHLORIDE: 79 mmol/L — AB (ref 101–111)
CHLORIDE: 79 mmol/L — AB (ref 101–111)
CO2: 38 mmol/L — AB (ref 22–32)
CO2: 37 mmol/L — ABNORMAL HIGH (ref 22–32)
CREATININE: 1.55 mg/dL — AB (ref 0.44–1.00)
Calcium: 8.8 mg/dL — ABNORMAL LOW (ref 8.9–10.3)
Creatinine, Ser: 1.46 mg/dL — ABNORMAL HIGH (ref 0.44–1.00)
GFR calc Af Amer: 43 mL/min — ABNORMAL LOW (ref >60)
GFR calc non Af Amer: 34 mL/min — ABNORMAL LOW (ref >60)
GFR calc non Af Amer: 37 mL/min — ABNORMAL LOW (ref >60)
GFR, EST AFRICAN AMERICAN: 40 mL/min — AB (ref >60)
Glucose, Bld: 169 mg/dL — ABNORMAL HIGH (ref 65–99)
Glucose, Bld: 286 mg/dL — ABNORMAL HIGH (ref 65–99)
POTASSIUM: 3.2 mmol/L — AB (ref 3.5–5.1)
Potassium: 3.7 mmol/L (ref 3.5–5.1)
SODIUM: 128 mmol/L — AB (ref 135–145)
Sodium: 131 mmol/L — ABNORMAL LOW (ref 135–145)

## 2015-12-17 LAB — GLUCOSE, CAPILLARY
GLUCOSE-CAPILLARY: 178 mg/dL — AB (ref 65–99)
GLUCOSE-CAPILLARY: 157 mg/dL — AB (ref 65–99)
Glucose-Capillary: 149 mg/dL — ABNORMAL HIGH (ref 65–99)
Glucose-Capillary: 177 mg/dL — ABNORMAL HIGH (ref 65–99)

## 2015-12-17 LAB — CBC
HCT: 35.8 % — ABNORMAL LOW (ref 36.0–46.0)
HEMOGLOBIN: 11.6 g/dL — AB (ref 12.0–15.0)
MCH: 29.7 pg (ref 26.0–34.0)
MCHC: 32.4 g/dL (ref 30.0–36.0)
MCV: 91.6 fL (ref 78.0–100.0)
Platelets: 233 10*3/uL (ref 150–400)
RBC: 3.91 MIL/uL (ref 3.87–5.11)
RDW: 16 % — AB (ref 11.5–15.5)
WBC: 6.2 10*3/uL (ref 4.0–10.5)

## 2015-12-17 LAB — MAGNESIUM: Magnesium: 2.3 mg/dL (ref 1.7–2.4)

## 2015-12-17 MED ORDER — SODIUM CHLORIDE 0.9 % IV SOLN
INTRAVENOUS | Status: DC | PRN
  Administered 2015-12-17: 500 mL via INTRAVENOUS

## 2015-12-17 MED ORDER — POTASSIUM CHLORIDE CRYS ER 20 MEQ PO TBCR
40.0000 meq | EXTENDED_RELEASE_TABLET | Freq: Once | ORAL | Status: AC
  Administered 2015-12-17: 40 meq via ORAL

## 2015-12-17 MED ORDER — SODIUM CHLORIDE 0.9 % IV SOLN
INTRAVENOUS | Status: DC | PRN
  Administered 2015-12-21: 250 mL via INTRAVENOUS

## 2015-12-17 NOTE — Progress Notes (Signed)
Orthopedic Tech Progress Note Patient Details:  Christine Knight 1951-03-26 [id number]  Ortho Devices Type of Ortho Device: Radio broadcast assistant Ortho Device/Splint Location: (B) LE Ortho Device/Splint Interventions: Ordered, Application   Jennye Moccasin 12/17/2015, 5:08 PM

## 2015-12-17 NOTE — Progress Notes (Signed)
Patient ID: Christine Knight, female   DOB: [birth year], 65 y.o.   MRN: [id number]     Advanced Heart Failure Rounding Note  PCP: Dr. Sanda Linger Primary Cardiologist: Dr Myrtis Ser  Subjective:    Admitted 12/08/15 with ma, and a 40+ lb weight gain. 2 weeks of progressive SOB, LE edema, and a 40+ lb weight gain. Baseline weight  267 lbs 07/25/15. Weight 309 lbs on admission.  R/LHC as below consistent with volume overload and pulmonary hypertension.  Echo (1/17) with EF 15-20%, moderate RV systolic dysfunction.   RHC/LHC on 2/1: - Patent bypass grafts, severe native vessel disease. RA mean 23 PA 86/34, mean 56 PCWP mean 37 CI 2.4 PVR 3.2 WU  Milrinone 0.125 added 2/3 to help with diuresis. Diamox added 2./   Diuresis not particularly impressive given very aggressive diuretics, net negative 900c.  Weight down 3 pounds though. CO-ox 67% Creatinine up slightly. CVP still 15-17 (was 25)  Breathing OK this morning. Feels fatigued. Could not walk with CR.  No CP or lightheadedness/dizziness.    Objective:   Weight Range: 128.912 kg (284 lb 3.2 oz) Body mass index is 44.5 kg/(m^2).   Vital Signs:   Temp:  [98.5 F (36.9 C)-99.5 F (37.5 C)] 99.5 F (37.5 C) (02/05 0851) Pulse Rate:  [57-82] 82 (02/05 0733) Resp:  [18-27] 24 (02/05 0851) BP: (95-111)/(55-71) 99/55 mmHg (02/05 1200) SpO2:  [96 %-99 %] 99 % (02/05 0733) Weight:  [128.912 kg (284 lb 3.2 oz)] 128.912 kg (284 lb 3.2 oz) (02/05 0442) Last BM Date: 12/12/15  Weight change: Filed Weights   12/15/15 0500 12/16/15 0400 12/17/15 0442  Weight: 129.5 kg (285 lb 7.9 oz) 130.591 kg (287 lb 14.4 oz) 128.912 kg (284 lb 3.2 oz)    Intake/Output:   Intake/Output Summary (Last 24 hours) at 12/17/15 1209 Last data filed at 12/17/15 1200  Gross per 24 hour  Intake 1617.43 ml  Output   3205 ml  Net -1587.57 ml     Physical Exam: CVP 15-17 General:  Elderly appearing, NAD, obese. Lying in bed HEENT: normal Neck: thick  supple. JVP to jaw. Carotids 2+ bilat; no bruits. No thyromegaly or nodule noted. Cor: PMI nondisplaced. Distant, RRR. No rubs, gallops or murmurs. Lungs: clear anteriorly Abdomen: Morbidly obese, soft, NT, ND, no HSM. No bruits or masses. +BS. Remains edematous into flanks. Extremities: no cyanosis, clubbing, rash. 3+ edema to thighs. + foley Neuro: alert & orientedx3, cranial nerves grossly intact. moves all 4 extremities w/o difficulty. Affect pleasant  Telemetry: Reviewed personally, NSR 70s, Occasional PVCs  Labs: CBC  Recent Labs  12/17/15 0436  WBC 6.2  HGB 11.6*  HCT 35.8*  MCV 91.6  PLT 233   Basic Metabolic Panel  Recent Labs  12/16/15 0405 12/17/15 0436  NA 133* 131*  K 4.1 3.7  CL 83* 79*  CO2 38* 38*  GLUCOSE 179* 169*  BUN 21* 26*  CREATININE 1.47* 1.55*  CALCIUM 9.4 9.2   Liver Function Tests No results for input(s): AST, ALT, ALKPHOS, BILITOT, PROT, ALBUMIN in the last 72 hours. No results for input(s): LIPASE, AMYLASE in the last 72 hours. Cardiac Enzymes No results for input(s): CKTOTAL, CKMB, CKMBINDEX, TROPONINI in the last 72 hours.  BNP: BNP (last 3 results)  Recent Labs  05/29/15 1608 06/04/15 0945 12/08/15 0922  BNP 921.7* 340.1* 1155.5*    ProBNP (last 3 results) No results for input(s): PROBNP in the last 8760 hours.   D-Dimer No results for  input(s): DDIMER in the last 72 hours. Hemoglobin A1C No results for input(s): HGBA1C in the last 72 hours. Fasting Lipid Panel No results for input(s): CHOL, HDL, LDLCALC, TRIG, CHOLHDL, LDLDIRECT in the last 72 hours. Thyroid Function Tests No results for input(s): TSH, T4TOTAL, T3FREE, THYROIDAB in the last 72 hours.  Invalid input(s): FREET3   Medications:     Scheduled Medications: . acetaZOLAMIDE  250 mg Oral BID  . antiseptic oral rinse  7 mL Mouth Rinse BID  . aspirin  81 mg Oral Daily  . carvedilol  3.125 mg Oral BID WC  . enoxaparin (LOVENOX) injection  65 mg  Subcutaneous Q24H  . insulin aspart  0-15 Units Subcutaneous TID WC  . insulin aspart  0-5 Units Subcutaneous QHS  . isosorbide-hydrALAZINE  1 tablet Oral BID  . metolazone  5 mg Oral BID  . multivitamin with minerals  1 tablet Oral Daily  . potassium chloride  40 mEq Oral TID  . sodium chloride flush  10-40 mL Intracatheter Q12H  . spironolactone  25 mg Oral Daily    Infusions: . sodium chloride 500 mL (12/17/15 1002)  . furosemide (LASIX) infusion 30 mg/hr (12/17/15 0858)  . milrinone 0.125 mcg/kg/min (12/17/15 0800)    PRN Medications: sodium chloride, acetaminophen **OR** acetaminophen, alum & mag hydroxide-simeth, guaiFENesin, levalbuterol, ondansetron **OR** ondansetron (ZOFRAN) IV, oxyCODONE, polyethylene glycol, traMADol   Assessment   1. Acute on chronic CHF: Ischemic cardiomyopathy. NYHA Class IV.  Echo (1/17): EF 15-20% with moderate RV systolic dysfunction.  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  2. CAD s/p CABG x 4 2012: Angiography this admission with patent grafts, severe native vessel disease. 3. NSVT 4. Morbid Obesity 5. Acute on chronic renal failure, stage 3 6. Hypokalemia  Plan    She remains markedly volume overloaded.  Some diuresis but not marked given the diuretic doses she is getting and milrinone support. - Continue milrinone 0.125  - Continue Lasix 30 mg/hr and metolazone 5 bid and acetazolamide 250 mg bid.  - Continue spironolactone to 25 mg daily.   Creatinine remains stable with diuresis.   BP remains soft.  Will stop Bidil and carvedilol. Will consider adding low dose dopamine to facilitate diuresis  But she has been having frequent PVCs on monitor and don't want to exacerbate. Place UNNA boots. Supp K and mag.   Cardiac rehab limited due to leg pain and swelling.   Length of Stay: 9  Arvilla Meres MD 12/17/2015, 12:09 PM

## 2015-12-18 ENCOUNTER — Ambulatory Visit: Payer: BLUE CROSS/BLUE SHIELD | Admitting: Family Medicine

## 2015-12-18 LAB — BASIC METABOLIC PANEL
ANION GAP: 10 (ref 5–15)
BUN: 26 mg/dL — ABNORMAL HIGH (ref 6–20)
CO2: 40 mmol/L — ABNORMAL HIGH (ref 22–32)
Calcium: 9.1 mg/dL (ref 8.9–10.3)
Chloride: 81 mmol/L — ABNORMAL LOW (ref 101–111)
Creatinine, Ser: 1.45 mg/dL — ABNORMAL HIGH (ref 0.44–1.00)
GFR calc Af Amer: 43 mL/min — ABNORMAL LOW (ref >60)
GFR, EST NON AFRICAN AMERICAN: 37 mL/min — AB (ref >60)
Glucose, Bld: 201 mg/dL — ABNORMAL HIGH (ref 65–99)
POTASSIUM: 3 mmol/L — AB (ref 3.5–5.1)
SODIUM: 131 mmol/L — AB (ref 135–145)

## 2015-12-18 LAB — GLUCOSE, CAPILLARY
GLUCOSE-CAPILLARY: 232 mg/dL — AB (ref 65–99)
GLUCOSE-CAPILLARY: 225 mg/dL — AB (ref 65–99)
GLUCOSE-CAPILLARY: 206 mg/dL — AB (ref 65–99)
GLUCOSE-CAPILLARY: 202 mg/dL — AB (ref 65–99)
Glucose-Capillary: 188 mg/dL — ABNORMAL HIGH (ref 65–99)

## 2015-12-18 LAB — CARBOXYHEMOGLOBIN
Carboxyhemoglobin: 2.2 % — ABNORMAL HIGH (ref 0.5–1.5)
METHEMOGLOBIN: 0.5 % (ref 0.0–1.5)
O2 Saturation: 67.3 %
Total hemoglobin: 12.9 g/dL (ref 12.0–16.0)

## 2015-12-18 MED ORDER — POTASSIUM CHLORIDE CRYS ER 20 MEQ PO TBCR
40.0000 meq | EXTENDED_RELEASE_TABLET | Freq: Once | ORAL | Status: AC
  Administered 2015-12-18: 40 meq via ORAL
  Filled 2015-12-18: qty 2

## 2015-12-18 NOTE — Progress Notes (Signed)
CARDIAC REHAB PHASE I   PRE:  Rate/Rhythm: 79 SR c/ occ PVCs  BP:  Sitting: 107/54        SaO2: 99 2L  MODE:  Ambulation: 100 ft   POST:  Rate/Rhythm: 95 SR c/ frequent PVCs  BP:  Sitting: 113/56         SaO2: 93 2L  Pt up in recliner, c/o discomfort in legs due to unna boot wraps. Pt agreeable to walk. Pt ambulated 100 ft on 2L O2, IV, rolling walker, foley catheter, gait belt, assist x1, slow fairly steady gait, tolerated fair. Pt c/o significant DOE, mild dizziness, feeling of tightness and weakness in her legs and fatigue, standing rest x3. VSS. Pt states she was able to walk independently and care for herself prior to admission and it is her goal to return to her baseline. Pt to recliner after walk, feet elevated, call bell within reach. Will follow.    [id number] Joylene Grapes, RN, BSN 12/18/2015 11:14 AM

## 2015-12-18 NOTE — Progress Notes (Signed)
Patient ID: Christine Knight, female   DOB: [birth year], 65 y.o.   MRN: [id number]     Advanced Heart Failure Rounding Note  PCP: Dr. Sanda Linger Primary Cardiologist: Dr Myrtis Ser  Subjective:    Admitted 12/08/15 with ma, and a 40+ lb weight gain. 2 weeks of progressive SOB, LE edema, and a 40+ lb weight gain. Baseline weight  267 lbs 07/25/15. Weight 309 lbs on admission.  R/LHC as below consistent with volume overload and pulmonary hypertension.  Echo (1/17) with EF 15-20%, moderate RV systolic dysfunction.   RHC/LHC on 2/1: - Patent bypass grafts, severe native vessel disease. RA mean 23 PA 86/34, mean 56 PCWP mean 37 CI 2.4 PVR 3.2 WU  Milrinone 0.125 added 2/3 to help with diuresis. Diuresing with lasix drip, diamox, and metolazone. Brisk diuresis noted. Weight down 7 pounds. CO-OX 67%.   Feeling a little better. Denies SOB. Having ongoing leg pain.    Objective:   Weight Range: 277 lb 1.9 oz (125.7 kg) Body mass index is 43.39 kg/(m^2).   Vital Signs:   Temp:  [97.9 F (36.6 C)-99.5 F (37.5 C)] 97.9 F (36.6 C) (02/06 0320) Pulse Rate:  [71-83] 75 (02/06 0400) Resp:  [20-26] 24 (02/06 0400) BP: (94-105)/(51-65) 105/62 mmHg (02/06 0320) SpO2:  [93 %-100 %] 93 % (02/06 0400) Weight:  [277 lb 1.9 oz (125.7 kg)] 277 lb 1.9 oz (125.7 kg) (02/06 0525) Last BM Date: 12/12/15  Weight change: Filed Weights   12/16/15 0400 12/17/15 0442 12/18/15 0525  Weight: 287 lb 14.4 oz (130.591 kg) 284 lb 3.2 oz (128.912 kg) 277 lb 1.9 oz (125.7 kg)    Intake/Output:   Intake/Output Summary (Last 24 hours) at 12/18/15 0738 Last data filed at 12/18/15 0600  Gross per 24 hour  Intake 947.43 ml  Output   4800 ml  Net -3852.57 ml     Physical Exam: CVP 13-14 General:  Elderly appearing, NAD, obese. In the chair.  HEENT: normal Neck: thick supple. JVP to jaw. Carotids 2+ bilat; no bruits. No thyromegaly or nodule noted. Cor: PMI nondisplaced. Distant, RRR. No rubs, gallops or  murmurs. Lungs: clear anteriorly Abdomen: Morbidly obese, soft, NT, ND, no HSM. No bruits or masses. +BS. Remains edematous into flanks. Extremities: no cyanosis, clubbing, rash. 3+ edema to thighs. R and LLE compression wraps.  Neuro: alert & orientedx3, cranial nerves grossly intact. moves all 4 extremities w/o difficulty. Affect pleasant GU: Foley  Telemetry: Reviewed personally, NSR 70s, Occasional PVCs  Labs: CBC  Recent Labs  12/17/15 0436  WBC 6.2  HGB 11.6*  HCT 35.8*  MCV 91.6  PLT 233   Basic Metabolic Panel  Recent Labs  12/17/15 1345 12/18/15 0400  NA 128* 131*  K 3.2* 3.0*  CL 79* 81*  CO2 37* 40*  GLUCOSE 286* 201*  BUN 25* 26*  CREATININE 1.46* 1.45*  CALCIUM 8.8* 9.1  MG 2.3  --    Liver Function Tests No results for input(s): AST, ALT, ALKPHOS, BILITOT, PROT, ALBUMIN in the last 72 hours. No results for input(s): LIPASE, AMYLASE in the last 72 hours. Cardiac Enzymes No results for input(s): CKTOTAL, CKMB, CKMBINDEX, TROPONINI in the last 72 hours.  BNP: BNP (last 3 results)  Recent Labs  05/29/15 1608 06/04/15 0945 12/08/15 0922  BNP 921.7* 340.1* 1155.5*    ProBNP (last 3 results) No results for input(s): PROBNP in the last 8760 hours.   D-Dimer No results for input(s): DDIMER in the last 72 hours. Hemoglobin  A1C No results for input(s): HGBA1C in the last 72 hours. Fasting Lipid Panel No results for input(s): CHOL, HDL, LDLCALC, TRIG, CHOLHDL, LDLDIRECT in the last 72 hours. Thyroid Function Tests No results for input(s): TSH, T4TOTAL, T3FREE, THYROIDAB in the last 72 hours.  Invalid input(s): FREET3   Medications:     Scheduled Medications: . acetaZOLAMIDE  250 mg Oral BID  . antiseptic oral rinse  7 mL Mouth Rinse BID  . aspirin  81 mg Oral Daily  . enoxaparin (LOVENOX) injection  65 mg Subcutaneous Q24H  . insulin aspart  0-15 Units Subcutaneous TID WC  . insulin aspart  0-5 Units Subcutaneous QHS  . metolazone  5  mg Oral BID  . multivitamin with minerals  1 tablet Oral Daily  . potassium chloride  40 mEq Oral TID  . sodium chloride flush  10-40 mL Intracatheter Q12H  . spironolactone  25 mg Oral Daily    Infusions: . sodium chloride    . furosemide (LASIX) infusion 30 mg/hr (12/17/15 2041)  . milrinone 0.125 mcg/kg/min (12/18/15 0219)    PRN Medications: sodium chloride, acetaminophen **OR** acetaminophen, alum & mag hydroxide-simeth, guaiFENesin, levalbuterol, ondansetron **OR** ondansetron (ZOFRAN) IV, oxyCODONE, polyethylene glycol, traMADol   Assessment   1. Acute on chronic CHF: Ischemic cardiomyopathy. NYHA Class IV.  Echo (1/17): EF 15-20% with moderate RV systolic dysfunction.  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  2. CAD s/p CABG x 4 2012: Angiography this admission with patent grafts, severe native vessel disease. 3. NSVT 4. Morbid Obesity 5. Acute on chronic renal failure, stage 3 6. Hypokalemia 7. Hyponatremia  Plan    She remains markedly volume overloaded.  Brisk diuresis noted. CVP coming down. Weight down 7 pounds. Renal function stable. Keep off bidil and BB for now.  - Continue milrinone 0.125  - Continue Lasix 30 mg/hr and metolazone 5 bid and acetazolamide 250 mg bid.  - Continue spironolactone to 25 mg daily.  -Supplement K. Check Mag in am.   Cardiac rehab limited due to leg pain and swelling. Consult PT.   Length of Stay: 10  [name]  NP-C  12/18/2015, 7:38 AM  Patient seen and examined with Tonye Becket, NP. We discussed all aspects of the encounter. I agree with the assessment and plan as stated above.   We are finally making progress with diuresis. She is requiring very high dose lasix, metolazone, milrinone, spiro and diamox. Renal function stable. Supp K+. Suspect she has at least 2-3 more days of IV diuresis.   Bensimhon, Daniel,MD 9:15 AM

## 2015-12-18 NOTE — Evaluation (Signed)
Physical Therapy Evaluation Patient Details Name: Christine Knight MRN: [id number] DOB: January 03, 1951 Today's Date: 12/18/2015   History of Present Illness  patient is a 65 yo female admitted 12/08/15 with ma, and a 40+ lb weight gain. 2 weeks of progressive SOB, LE edema, and a 40+ lb weight gain. Findings consistent with volume overload and pulmonary hypertension.  Clinical Impression  Patient demonstrates deficits in functional mobility as indicated below. Will need continued skilled PT to address deficits and maximize function. Will see as indicated and progress as tolerated  OF NOTE: Patient with some DOE and dizziness, desaturation to 89% on room air with ambulation, BP assessed in sitting post activity 109/69 HR 90s (Afib) on monitor.    Follow Up Recommendations Home health PT;Supervision for mobility/OOB    Equipment Recommendations  Rolling walker with 5" wheels    Recommendations for Other Services       Precautions / Restrictions Precautions Precautions: Fall Restrictions Weight Bearing Restrictions: No      Mobility  Bed Mobility               General bed mobility comments: received in recliner  Transfers Overall transfer level: Needs assistance Equipment used: Rolling walker (2 wheeled) Transfers: Sit to/from Stand Sit to Stand: Min guard         General transfer comment: VCs for positioning at edge of chair, increased time and effort to elevate to standing.  Ambulation/Gait Ambulation/Gait assistance: Min guard;Supervision Ambulation Distance (Feet): 120 Feet Assistive device: Rolling walker (2 wheeled) Gait Pattern/deviations: Step-through pattern;Decreased stride length;Wide base of support;Trunk flexed;Shuffle Gait velocity: decreased Gait velocity interpretation: Below normal speed for age/gender General Gait Details: limited by LEs swelling/discomfort and dizziness/fatigue (ambulated on room air dest to 89%)  Stairs             Wheelchair Mobility    Modified Rankin (Stroke Patients Only)       Balance Overall balance assessment: Needs assistance Sitting-balance support: Feet supported Sitting balance-[name] Scale: Good     Standing balance support: During functional activity;Bilateral upper extremity supported Standing balance-[name] Scale: Fair Standing balance comment: heavy reliance on Rw                             Pertinent Vitals/Pain Pain Assessment: Faces Faces Pain Scale: Hurts even more Pain Location: BLEs Pain Descriptors / Indicators: Aching;Discomfort;Guarding;Heaviness Pain Intervention(s): Limited activity within patient's tolerance;Monitored during session;Repositioned;Relaxation    Home Living Family/patient expects to be discharged to:: Private residence Living Arrangements: Other relatives;Children (SISTER AND DAUGHTER LIVE WITH PATIENT)   Type of Home: Apartment Home Access: Stairs to enter Entrance Stairs-Rails: Right Entrance Stairs-Number of Steps: 6 Home Layout: One level Home Equipment: None      Prior Function Level of Independence: Independent               Hand Dominance   Dominant Hand: Right    Extremity/Trunk Assessment   Upper Extremity Assessment: Defer to OT evaluation           Lower Extremity Assessment: Generalized weakness (increased BLE edema )      Cervical / Trunk Assessment:  (increased body habitus)  Communication   Communication: No difficulties  Cognition Arousal/Alertness: Awake/alert Behavior During Therapy: WFL for tasks assessed/performed Overall Cognitive Status: Within Functional Limits for tasks assessed                      General Comments General  comments (skin integrity, edema, etc.): increased Bilateral LE edema noted. Educated patient on elevation for edema control    Exercises        Assessment/Plan    PT Assessment Patient needs continued PT services  PT Diagnosis Difficulty  walking;Abnormality of gait;Acute pain;Generalized weakness   PT Problem List Decreased strength;Decreased activity tolerance;Decreased balance;Decreased mobility;Decreased range of motion;Cardiopulmonary status limiting activity;Pain;Obesity  PT Treatment Interventions DME instruction;Gait training;Stair training;Functional mobility training;Therapeutic activities;Therapeutic exercise;Balance training;Patient/family education   PT Goals (Current goals can be found in the Care Plan section) Acute Rehab PT Goals Patient Stated Goal: to go home PT Goal Formulation: With patient Time For Goal Achievement: 01/01/16 Potential to Achieve Goals: Good    Frequency Min 3X/week   Barriers to discharge        Co-evaluation               End of Session Equipment Utilized During Treatment: Gait belt;Oxygen Activity Tolerance: Patient limited by fatigue Patient left: in chair;with call bell/phone within reach Nurse Communication: Mobility status         Time: 1610-9604 PT Time Calculation (min) (ACUTE ONLY): 21 min   Charges:   PT Evaluation $PT Eval Moderate Complexity: 1 Procedure     PT G CodesFabio Asa January 09, 2016, 3:42 PM Charlotte Crumb, PT DPT  [phone]

## 2015-12-18 NOTE — Progress Notes (Signed)
Inpatient Diabetes Program Recommendations  AACE/ADA: New Consensus Statement on Inpatient Glycemic Control (2015)  Target Ranges:  Prepandial:   less than 140 mg/dL      Peak postprandial:   less than 180 mg/dL (1-2 hours)      Critically ill patients:  140 - 180 mg/dL   Review of Glycemic Control: Results for EMMILY, PELLEGRIN (MRN [id number]) as of 12/18/2015 13:26  Ref. Range 12/17/2015 17:37 12/17/2015 21:38 12/18/2015 09:08 12/18/2015 09:30 12/18/2015 11:52  Glucose-Capillary Latest Ref Range: 65-99 mg/dL 409 (H) 811 (H) 914 (H) 225 (H) 206 (H)    Inpatient Diabetes Program Recommendations:     Noted patient is ordered a Heart Healthy diet. This coordinator added the carbohydrate modified to the heart healthy diet orders. Glucose remaining in 200's presently, although patient is documented as eating only 25%. Fasting this am in 200's, higher than the HS glucose last night. May want to consider adding low dose basal insulin while here of lantus 10 units.  Thank you Lenor Coffin, RN, MSN, CDE  Diabetes Inpatient Program Office: [phone] Pager: [phone] 8:00 am to 5:00 pm

## 2015-12-18 NOTE — Progress Notes (Signed)
PT Cancellation Note  Patient Details Name: Christine Knight MRN: [id number] DOB: 06-Nov-1951   Cancelled Treatment:    Reason Eval/Treat Not Completed: Fatigue/lethargy limiting ability to participate, patient just finished with cardiac rehab. Will follow up this PM.   Fabio Asa 12/18/2015, 11:54 AM Charlotte Crumb, PT DPT  [phone]

## 2015-12-19 LAB — MAGNESIUM: MAGNESIUM: 2.3 mg/dL (ref 1.7–2.4)

## 2015-12-19 LAB — CARBOXYHEMOGLOBIN
Carboxyhemoglobin: 2 % — ABNORMAL HIGH (ref 0.5–1.5)
Methemoglobin: 0.7 % (ref 0.0–1.5)
O2 Saturation: 67.3 %
Total hemoglobin: 12.1 g/dL (ref 12.0–16.0)

## 2015-12-19 LAB — BASIC METABOLIC PANEL
Anion gap: 13 (ref 5–15)
BUN: 26 mg/dL — AB (ref 6–20)
CO2: 39 mmol/L — ABNORMAL HIGH (ref 22–32)
CREATININE: 1.47 mg/dL — AB (ref 0.44–1.00)
Calcium: 9.7 mg/dL (ref 8.9–10.3)
Chloride: 79 mmol/L — ABNORMAL LOW (ref 101–111)
GFR, EST AFRICAN AMERICAN: 42 mL/min — AB (ref >60)
GFR, EST NON AFRICAN AMERICAN: 37 mL/min — AB (ref >60)
GLUCOSE: 231 mg/dL — AB (ref 65–99)
POTASSIUM: 3 mmol/L — AB (ref 3.5–5.1)
SODIUM: 131 mmol/L — AB (ref 135–145)

## 2015-12-19 LAB — GLUCOSE, CAPILLARY
GLUCOSE-CAPILLARY: 226 mg/dL — AB (ref 65–99)
GLUCOSE-CAPILLARY: 219 mg/dL — AB (ref 65–99)
Glucose-Capillary: 210 mg/dL — ABNORMAL HIGH (ref 65–99)
Glucose-Capillary: 243 mg/dL — ABNORMAL HIGH (ref 65–99)

## 2015-12-19 NOTE — Progress Notes (Signed)
Inpatient Diabetes Program Recommendations  AACE/ADA: New Consensus Statement on Inpatient Glycemic Control (2015)  Target Ranges:  Prepandial:   less than 140 mg/dL      Peak postprandial:   less than 180 mg/dL (1-2 hours)      Critically ill patients:  140 - 180 mg/dL   Review of Glycemic Control Results for CHANDLAR, STAEBELL (MRN [id number]) as of 12/19/2015 12:38  Ref. Range 12/18/2015 09:30 12/18/2015 11:52 12/18/2015 16:34 12/18/2015 22:09 12/19/2015 07:43 12/19/2015 11:25  Glucose-Capillary Latest Ref Range: 65-99 mg/dL 244 (H) 010 (H) 272 (H) 202 (H) 210 (H) 226 (H)   Inpatient Diabetes Program Recommendations:    Glucose remains in 200's. In addition to correction ordered, please consider addition of low dose basal insulin, lantus or levemir 10 units.  Thank you Lenor Coffin, RN, MSN, CDE  Diabetes Inpatient Program Office: [phone] Pager: [phone] 8:00 am to 5:00 pm

## 2015-12-19 NOTE — Progress Notes (Signed)
Patient ID: Christine Knight, female   DOB: January 07, 1951, 65 y.o.   MRN: [id number]     Advanced Heart Failure Rounding Note  PCP: Dr. Sanda Linger Primary Cardiologist: Dr Myrtis Ser  Subjective:    Admitted 12/08/15 with 40+ lb weight gain. 2 weeks of progressive SOB, LE edema, and a 40+ lb weight gain. Baseline weight  267 lbs 07/25/15. Weight 309 lbs on admission.  R/LHC as below consistent with volume overload and pulmonary hypertension.  Echo (1/17) with EF 15-20%, moderate RV systolic dysfunction.   RHC/LHC on 2/1: - Patent bypass grafts, severe native vessel disease. RA mean 23 PA 86/34, mean 56 PCWP mean 37 CI 2.4 PVR 3.2 WU  Milrinone 0.125 added 2/3 to help with diuresis. Diuresing with lasix drip, diamox, and metolazone. Brisk diuresis noted. Weight down another 10  pounds. CO-OX 67%.    Denies SOB. Having ongoing leg pain. Able to stand.    Objective:   Weight Range: 267 lb 10.2 oz (121.4 kg) Body mass index is 41.91 kg/(m^2).   Vital Signs:   Temp:  [97.6 F (36.4 C)-99.2 F (37.3 C)] 97.9 F (36.6 C) (02/07 1125) Pulse Rate:  [53-78] 57 (02/07 0700) Resp:  [19-30] 25 (02/07 1125) BP: (84-105)/(44-62) 89/56 mmHg (02/07 1125) SpO2:  [90 %-100 %] 98 % (02/07 1125) Weight:  [267 lb 10.2 oz (121.4 kg)] 267 lb 10.2 oz (121.4 kg) (02/07 0414) Last BM Date: 12/12/15  Weight change: Filed Weights   12/17/15 0442 12/18/15 0525 12/19/15 0414  Weight: 284 lb 3.2 oz (128.912 kg) 277 lb 1.9 oz (125.7 kg) 267 lb 10.2 oz (121.4 kg)    Intake/Output:   Intake/Output Summary (Last 24 hours) at 12/19/15 1131 Last data filed at 12/19/15 0700  Gross per 24 hour  Intake   1188 ml  Output   4800 ml  Net  -3612 ml     Physical Exam: CVP 14 General:  Elderly appearing, NAD, obese. In the chair.  HEENT: normal Neck: thick supple. JVP to jaw. Carotids 2+ bilat; no bruits. No thyromegaly or nodule noted. Cor: PMI nondisplaced. Distant, RRR. No rubs, gallops or murmurs. Lungs:  clear anteriorly Abdomen: Morbidly obese, soft, NT, ND, no HSM. No bruits or masses. +BS. Remains edematous into flanks. Extremities: no cyanosis, clubbing, rash. 2+ edema R and LLE compression wraps.  Neuro: alert & orientedx3, cranial nerves grossly intact. moves all 4 extremities w/o difficulty. Affect pleasant GU: Foley  Telemetry: Reviewed personally, NSR 70s, Occasional PVCs  Labs: CBC  Recent Labs  12/17/15 0436  WBC 6.2  HGB 11.6*  HCT 35.8*  MCV 91.6  PLT 233   Basic Metabolic Panel  Recent Labs  12/17/15 1345 12/18/15 0400 12/19/15 0400  NA 128* 131* 131*  K 3.2* 3.0* 3.0*  CL 79* 81* 79*  CO2 37* 40* 39*  GLUCOSE 286* 201* 231*  BUN 25* 26* 26*  CREATININE 1.46* 1.45* 1.47*  CALCIUM 8.8* 9.1 9.7  MG 2.3  --  2.3   Liver Function Tests No results for input(s): AST, ALT, ALKPHOS, BILITOT, PROT, ALBUMIN in the last 72 hours. No results for input(s): LIPASE, AMYLASE in the last 72 hours. Cardiac Enzymes No results for input(s): CKTOTAL, CKMB, CKMBINDEX, TROPONINI in the last 72 hours.  BNP: BNP (last 3 results)  Recent Labs  05/29/15 1608 06/04/15 0945 12/08/15 0922  BNP 921.7* 340.1* 1155.5*    ProBNP (last 3 results) No results for input(s): PROBNP in the last 8760 hours.   D-Dimer  No results for input(s): DDIMER in the last 72 hours. Hemoglobin A1C No results for input(s): HGBA1C in the last 72 hours. Fasting Lipid Panel No results for input(s): CHOL, HDL, LDLCALC, TRIG, CHOLHDL, LDLDIRECT in the last 72 hours. Thyroid Function Tests No results for input(s): TSH, T4TOTAL, T3FREE, THYROIDAB in the last 72 hours.  Invalid input(s): FREET3   Medications:     Scheduled Medications: . acetaZOLAMIDE  250 mg Oral BID  . antiseptic oral rinse  7 mL Mouth Rinse BID  . aspirin  81 mg Oral Daily  . enoxaparin (LOVENOX) injection  65 mg Subcutaneous Q24H  . insulin aspart  0-15 Units Subcutaneous TID WC  . insulin aspart  0-5 Units  Subcutaneous QHS  . metolazone  5 mg Oral BID  . multivitamin with minerals  1 tablet Oral Daily  . potassium chloride  40 mEq Oral TID  . sodium chloride flush  10-40 mL Intracatheter Q12H  . spironolactone  25 mg Oral Daily    Infusions: . sodium chloride 5 mL/hr at 12/18/15 0800  . furosemide (LASIX) infusion 30 mg/hr (12/19/15 1125)  . milrinone 0.125 mcg/kg/min (12/18/15 2343)    PRN Medications: sodium chloride, acetaminophen **OR** acetaminophen, alum & mag hydroxide-simeth, guaiFENesin, levalbuterol, ondansetron **OR** ondansetron (ZOFRAN) IV, oxyCODONE, polyethylene glycol, traMADol   Assessment   1. Acute on chronic CHF: Ischemic cardiomyopathy. NYHA Class IV.  Echo (1/17): EF 15-20% with moderate RV systolic dysfunction.  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  2. CAD s/p CABG x 4 2012: Angiography this admission with patent grafts, severe native vessel disease. 3. NSVT 4. Morbid Obesity 5. Acute on chronic renal failure, stage 3 6. Hypokalemia 7. Hyponatremia  Plan    She remains markedly volume overloaded.  Brisk diuresis noted. CVP coming down. Weight down another 10  pounds. Renal function stable. Keep off bidil and BB for now.  - Continue milrinone 0.125  - Continue Lasix 30 mg/hr and metolazone 5 bid and acetazolamide 250 mg bid.  - Continue spironolactone to 25 mg daily.  -Supplement K. Check Mag in am.   Cardiac rehab limited due to leg pain and swelling. Consult PT.   Length of Stay: 11  [name]  NP-C  12/19/2015, 11:31 AM  Patient seen and examined with Tonye Becket, NP. We discussed all aspects of the encounter. I agree with the assessment and plan as stated above.   Volumes status getting better but still markedly overloaded. Renal function stable. Potassium is low. Continue milrinone and high-dose diuretics. supp K+ aggressively. Respiratory status also improved. Need to encourage more CR. Keep in SDU for at least 2 more days.  Bensimhon,  Daniel,MD 3:40 PM

## 2015-12-19 NOTE — Progress Notes (Signed)
CARDIAC REHAB PHASE I   PRE:  Rate/Rhythm: 70 SR PVCs  BP:  Supine:   Sitting: 89/56  Standing:    SaO2: 92%RA  MODE:  Ambulation: 250 ft   POST:  Rate/Rhythm: 98 SR PVCs/PACs  BP:  Supine:   Sitting: 105/62  Standing:    SaO2: 87%RA  94% 2L 1136-1210 Pt walked 250 ft on RA with rolling walker and asst x 2. Pt had c/o legs hurting but she wanted to increase distance from yesterday. No DOE noted but sats dropped with activity. Put back on 2L. Tolerated well. To recliner with call bell.   Luetta Nutting, RN BSN  12/19/2015 12:05 PM

## 2015-12-20 LAB — CARBOXYHEMOGLOBIN
CARBOXYHEMOGLOBIN: 2.2 % — AB (ref 0.5–1.5)
Carboxyhemoglobin: 1.8 % — ABNORMAL HIGH (ref 0.5–1.5)
METHEMOGLOBIN: 0.6 % (ref 0.0–1.5)
Methemoglobin: 0.6 % (ref 0.0–1.5)
O2 SAT: 55.4 %
O2 Saturation: 83.2 %
Total hemoglobin: 9.3 g/dL — ABNORMAL LOW (ref 12.0–16.0)
Total hemoglobin: 13.1 g/dL (ref 12.0–16.0)

## 2015-12-20 LAB — GLUCOSE, CAPILLARY
GLUCOSE-CAPILLARY: 211 mg/dL — AB (ref 65–99)
Glucose-Capillary: 198 mg/dL — ABNORMAL HIGH (ref 65–99)
Glucose-Capillary: 158 mg/dL — ABNORMAL HIGH (ref 65–99)
Glucose-Capillary: 218 mg/dL — ABNORMAL HIGH (ref 65–99)

## 2015-12-20 LAB — CBC
HEMATOCRIT: 40.4 % (ref 36.0–46.0)
HEMOGLOBIN: 12.9 g/dL (ref 12.0–15.0)
MCH: 28.8 pg (ref 26.0–34.0)
MCHC: 31.9 g/dL (ref 30.0–36.0)
MCV: 90.2 fL (ref 78.0–100.0)
PLATELETS: 326 10*3/uL (ref 150–400)
RBC: 4.48 MIL/uL (ref 3.87–5.11)
RDW: 15.7 % — ABNORMAL HIGH (ref 11.5–15.5)
WBC: 5.1 10*3/uL (ref 4.0–10.5)

## 2015-12-20 LAB — BASIC METABOLIC PANEL
ANION GAP: 12 (ref 5–15)
BUN: 25 mg/dL — AB (ref 6–20)
CALCIUM: 9.5 mg/dL (ref 8.9–10.3)
CO2: 39 mmol/L — AB (ref 22–32)
CREATININE: 1.47 mg/dL — AB (ref 0.44–1.00)
Chloride: 80 mmol/L — ABNORMAL LOW (ref 101–111)
GFR calc Af Amer: 42 mL/min — ABNORMAL LOW (ref >60)
GFR, EST NON AFRICAN AMERICAN: 37 mL/min — AB (ref >60)
GLUCOSE: 252 mg/dL — AB (ref 65–99)
Potassium: 4.1 mmol/L (ref 3.5–5.1)
Sodium: 131 mmol/L — ABNORMAL LOW (ref 135–145)

## 2015-12-20 MED ORDER — ENOXAPARIN SODIUM 60 MG/0.6ML ~~LOC~~ SOLN: 60.0000 mg | SUBCUTANEOUS | Status: DC

## 2015-12-20 NOTE — Progress Notes (Signed)
CARDIAC REHAB PHASE I   PRE:  Rate/Rhythm: 63 SR PACs  BP:  Supine:   Sitting: 96/52  Standing:    SaO2: 94% 3L  MODE:  Ambulation: 290 ft   POST:  Rate/Rhythm: 85 SR PACs/PVCs  BP:  Supine:   Sitting: 100/70  Standing:    SaO2: 95% 2L 1425-1500 Pt in deep sleep. Woke up and agreed to walk. Pt walked 290 ft on 2L with rolling walker and asst x 1 with steady gait. Pt to recliner after walk. Set up lunch as pt stated had not eaten today. Encouraged her to eat as she stated she felt a little lightheaded during walk but not enough to worry about falling . Call light in reach.   Luetta Nutting, RN BSN  12/20/2015 2:54 PM

## 2015-12-20 NOTE — Progress Notes (Signed)
Physical Therapy Treatment Patient Details Name: Christine Knight MRN: [id number] DOB: Nov 14, 1950 Today's Date: 12/20/2015    History of Present Illness patient is a 65 yo female admitted 12/08/15 with ma, and a 40+ lb weight gain. 2 weeks of progressive SOB, LE edema, and a 40+ lb weight gain. Findings consistent with volume overload and pulmonary hypertension.    PT Comments    Patient seen for mobility progression. Patient initially very lethargic and hard to arouse. Increased veral and tactile cues to incite alertness. Patient with increased dizziness with positional change (BP assessed 90s/70, improved to 110s/70s) SpO2 at rest on room air 89% ([place] was on her forehead) educated on proper use and importance of compliance with O2. Ambulated on 3 liters with saturations in low 90s, improved to 95% with rest and 3 liters. Will continue to see and progress as tolerated.   Follow Up Recommendations  Home health PT;Supervision for mobility/OOB     Equipment Recommendations  Rolling walker with 5" wheels    Recommendations for Other Services       Precautions / Restrictions Precautions Precautions: Fall Restrictions Weight Bearing Restrictions: No    Mobility  Bed Mobility Overal bed mobility: Needs Assistance Bed Mobility: Rolling;Sidelying to Sit Rolling: Min guard Sidelying to sit: Min guard       General bed mobility comments: increased time to perform. VCs for positioning  Transfers Overall transfer level: Needs assistance Equipment used: Rolling walker (2 wheeled) Transfers: Sit to/from Stand Sit to Stand: Min guard         General transfer comment: VCs for positioning at edge of chair, increased time and effort to elevate to standing.  Ambulation/Gait Ambulation/Gait assistance: Supervision Ambulation Distance (Feet): 130 Feet Assistive device: Rolling walker (2 wheeled) Gait Pattern/deviations: Step-through pattern;Decreased stride length;Wide base of  support;Trunk flexed;Shuffle Gait velocity: decreased Gait velocity interpretation: Below normal speed for age/gender General Gait Details: improved saturations on suuplemental O2 with activity 3 liters. (ambulated on room air dest to 89%)   Stairs            Wheelchair Mobility    Modified Rankin (Stroke Patients Only)       Balance   Sitting-balance support: Feet supported Sitting balance-[name] Scale: Good     Standing balance support: During functional activity Standing balance-[name] Scale: Fair                      Cognition Arousal/Alertness: Awake/alert;Lethargic (initally very lethargic) Behavior During Therapy: Flat affect Overall Cognitive Status: Within Functional Limits for tasks assessed                      Exercises      General Comments General comments (skin integrity, edema, etc.): spoke with patient at length regarding tips for energy conservation and demonstrated pursed lip breathing. Also discussed saturation values and why it is important to remain compliant with use of [name] (saturations were 89% at rest upon entering the room with [name] on forehead) Improved to 95% on 3 liters.      Pertinent Vitals/Pain Pain Assessment: Faces Faces Pain Scale: Hurts a little bit Pain Location: BLEs Pain Descriptors / Indicators: Aching;Discomfort;Guarding;Heaviness Pain Intervention(s): Monitored during session;Repositioned    Home Living                      Prior Function            PT Goals (current goals can now be found  in the care plan section) Acute Rehab PT Goals Patient Stated Goal: to go home PT Goal Formulation: With patient Time For Goal Achievement: 01/01/16 Potential to Achieve Goals: Good Progress towards PT goals: Progressing toward goals    Frequency  Min 3X/week    PT Plan Current plan remains appropriate    Co-evaluation             End of Session Equipment Utilized During Treatment: Gait  belt;Oxygen Activity Tolerance: Patient limited by fatigue Patient left: in chair;with call bell/phone within reach     Time: 1154-1218 PT Time Calculation (min) (ACUTE ONLY): 24 min  Charges:  $Gait Training: 8-22 mins $Therapeutic Activity: 8-22 mins                    G CodesFabio Asa 2015/12/21, 1:00 PM Charlotte Crumb, PT DPT  [phone]

## 2015-12-20 NOTE — Progress Notes (Signed)
Patient ID: Christine Knight, female   DOB: 09-22-1951, 65 y.o.   MRN: [id number]     Advanced Heart Failure Rounding Note    Subjective:    Admitted 12/08/15 with 40+ lb weight gain. 2 weeks of progressive SOB, LE edema, and a 40+ lb weight gain. Baseline weight  267 lbs 07/25/15. Weight 309 lbs on admission.  R/LHC as below consistent with volume overload and pulmonary hypertension.  Echo (1/17) with EF 15-20%, moderate RV systolic dysfunction.   RHC/LHC on 2/1: - Patent bypass grafts, severe native vessel disease. RA mean 23 PA 86/34, mean 56 PCWP mean 37 CI 2.4 PVR 3.2 WU  Milrinone 0.125 added 2/3 to help with diuresis. Diuresing with lasix drip, diamox, and metolazone. Brisk diuresis noted. Weight down another 10  Pounds. (52 pounds total) CO-OX down to 55%. Hgb on co-ox 12.1->9.3. Renal function stable. CVP 12-13   Denies SOB. Having ongoing leg pain. Able to stand. Working with CR.    Objective:   Weight Range: 116.892 kg (257 lb 11.2 oz) Body mass index is 40.35 kg/(m^2).   Vital Signs:   Temp:  [97.3 F (36.3 C)-98.4 F (36.9 C)] 97.3 F (36.3 C) (02/08 0800) Pulse Rate:  [73] 73 (02/08 0800) Resp:  [15-25] 20 (02/08 0800) BP: (89-108)/(48-64) 91/63 mmHg (02/08 0800) SpO2:  [97 %-98 %] 97 % (02/08 0800) Weight:  [116.892 kg (257 lb 11.2 oz)] 116.892 kg (257 lb 11.2 oz) (02/08 0400) Last BM Date: 12/19/15  Weight change: Filed Weights   12/18/15 0525 12/19/15 0414 12/20/15 0400  Weight: 125.7 kg (277 lb 1.9 oz) 121.4 kg (267 lb 10.2 oz) 116.892 kg (257 lb 11.2 oz)    Intake/Output:   Intake/Output Summary (Last 24 hours) at 12/20/15 0831 Last data filed at 12/20/15 0600  Gross per 24 hour  Intake 1097.7 ml  Output   7375 ml  Net -6277.3 ml     Physical Exam: CVP 12-13 General:  Lying flat in bed. NAD HEENT: normal Neck: thick supple. JVP to jaw. Carotids 2+ bilat; no bruits. No thyromegaly or nodule noted. Cor: PMI nondisplaced. Distant, RRR. No  rubs, gallops or murmurs. Lungs: clear anteriorly Abdomen: Morbidly obese, soft, NT, ND, no HSM. No bruits or masses. +BS. Remains edematous into flanks. Extremities: no cyanosis, clubbing, rash. 1-2+ edema R and LLE compression wraps.  Neuro: alert & orientedx3, cranial nerves grossly intact. moves all 4 extremities w/o difficulty. Affect pleasant GU: Foley  Telemetry: Reviewed personally, NSR 70s, Occasional PVCs  Labs: CBC No results for input(s): WBC, NEUTROABS, HGB, HCT, MCV, PLT in the last 72 hours. Basic Metabolic Panel  Recent Labs  12/17/15 1345  12/19/15 0400 12/20/15 0350  NA 128*  < > 131* 131*  K 3.2*  < > 3.0* 4.1  CL 79*  < > 79* 80*  CO2 37*  < > 39* 39*  GLUCOSE 286*  < > 231* 252*  BUN 25*  < > 26* 25*  CREATININE 1.46*  < > 1.47* 1.47*  CALCIUM 8.8*  < > 9.7 9.5  MG 2.3  --  2.3  --   < > = values in this interval not displayed. Liver Function Tests No results for input(s): AST, ALT, ALKPHOS, BILITOT, PROT, ALBUMIN in the last 72 hours. No results for input(s): LIPASE, AMYLASE in the last 72 hours. Cardiac Enzymes No results for input(s): CKTOTAL, CKMB, CKMBINDEX, TROPONINI in the last 72 hours.  BNP: BNP (last 3 results)  Recent Labs  05/29/15 1608 06/04/15 0945 12/08/15 0922  BNP 921.7* 340.1* 1155.5*    ProBNP (last 3 results) No results for input(s): PROBNP in the last 8760 hours.   D-Dimer No results for input(s): DDIMER in the last 72 hours. Hemoglobin A1C No results for input(s): HGBA1C in the last 72 hours. Fasting Lipid Panel No results for input(s): CHOL, HDL, LDLCALC, TRIG, CHOLHDL, LDLDIRECT in the last 72 hours. Thyroid Function Tests No results for input(s): TSH, T4TOTAL, T3FREE, THYROIDAB in the last 72 hours.  Invalid input(s): FREET3   Medications:     Scheduled Medications: . acetaZOLAMIDE  250 mg Oral BID  . antiseptic oral rinse  7 mL Mouth Rinse BID  . aspirin  81 mg Oral Daily  . enoxaparin (LOVENOX)  injection  65 mg Subcutaneous Q24H  . insulin aspart  0-15 Units Subcutaneous TID WC  . insulin aspart  0-5 Units Subcutaneous QHS  . metolazone  5 mg Oral BID  . multivitamin with minerals  1 tablet Oral Daily  . potassium chloride  40 mEq Oral TID  . sodium chloride flush  10-40 mL Intracatheter Q12H  . spironolactone  25 mg Oral Daily    Infusions: . sodium chloride 5 mL/hr at 12/18/15 0800  . furosemide (LASIX) infusion 30 mg/hr (12/20/15 0538)  . milrinone 0.125 mcg/kg/min (12/19/15 2007)    PRN Medications: sodium chloride, acetaminophen **OR** acetaminophen, alum & mag hydroxide-simeth, guaiFENesin, levalbuterol, ondansetron **OR** ondansetron (ZOFRAN) IV, oxyCODONE, polyethylene glycol, traMADol   Assessment   1. Acute on chronic CHF: Ischemic cardiomyopathy. NYHA Class IV.  Echo (1/17): EF 15-20% with moderate RV systolic dysfunction.  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  2. CAD s/p CABG x 4 2012: Angiography this admission with patent grafts, severe native vessel disease. 3. NSVT 4. Morbid Obesity 5. Acute on chronic renal failure, stage 3 6. Hypokalemia 7. Hyponatremia  Plan    Volume status continues to improve.  Brisk diuresis ongoing. CVP now 12 Weight down another 10  pounds. Renal function stable. Keep off bidil and BB for now.  - Continue milrinone 0.125  - Continue Lasix 30 mg/hr and metolazone 5 bid and acetazolamide 250 mg bid.  - Continue spironolactone to 25 mg daily.  - K improved.   Hgb and co-ox down. Will check CBC and repeat co-ox. Continue IV lasix one more day.   Appreciate DM2 coordinators eval. Glucose remains high. Will add lantus 10u.   Continue CR and PT.    Length of Stay: 12   Bensimhon, Daniel,MD 8:31 AM

## 2015-12-21 DIAGNOSIS — E871 Hypo-osmolality and hyponatremia: Secondary | ICD-10-CM

## 2015-12-21 DIAGNOSIS — N183 Chronic kidney disease, stage 3 (moderate): Secondary | ICD-10-CM

## 2015-12-21 LAB — CBC
HEMATOCRIT: 39.9 % (ref 36.0–46.0)
HEMOGLOBIN: 12.7 g/dL (ref 12.0–15.0)
MCH: 28.7 pg (ref 26.0–34.0)
MCHC: 31.8 g/dL (ref 30.0–36.0)
MCV: 90.1 fL (ref 78.0–100.0)
Platelets: 328 10*3/uL (ref 150–400)
RBC: 4.43 MIL/uL (ref 3.87–5.11)
RDW: 15.6 % — AB (ref 11.5–15.5)
WBC: 4.8 10*3/uL (ref 4.0–10.5)

## 2015-12-21 LAB — BASIC METABOLIC PANEL
Anion gap: 16 — ABNORMAL HIGH (ref 5–15)
BUN: 26 mg/dL — AB (ref 6–20)
CHLORIDE: 77 mmol/L — AB (ref 101–111)
CO2: 40 mmol/L — ABNORMAL HIGH (ref 22–32)
Calcium: 10.1 mg/dL (ref 8.9–10.3)
Creatinine, Ser: 1.34 mg/dL — ABNORMAL HIGH (ref 0.44–1.00)
GFR calc Af Amer: 47 mL/min — ABNORMAL LOW (ref >60)
GFR calc non Af Amer: 41 mL/min — ABNORMAL LOW (ref >60)
Glucose, Bld: 212 mg/dL — ABNORMAL HIGH (ref 65–99)
POTASSIUM: 3.4 mmol/L — AB (ref 3.5–5.1)
SODIUM: 133 mmol/L — AB (ref 135–145)

## 2015-12-21 LAB — GLUCOSE, CAPILLARY
GLUCOSE-CAPILLARY: 228 mg/dL — AB (ref 65–99)
GLUCOSE-CAPILLARY: 228 mg/dL — AB (ref 65–99)
GLUCOSE-CAPILLARY: 214 mg/dL — AB (ref 65–99)
Glucose-Capillary: 194 mg/dL — ABNORMAL HIGH (ref 65–99)

## 2015-12-21 LAB — CARBOXYHEMOGLOBIN
Carboxyhemoglobin: 1.8 % — ABNORMAL HIGH (ref 0.5–1.5)
Methemoglobin: 0.8 % (ref 0.0–1.5)
O2 Saturation: 63.8 %
Total hemoglobin: 13.4 g/dL (ref 12.0–16.0)

## 2015-12-21 LAB — MAGNESIUM: Magnesium: 2.3 mg/dL (ref 1.7–2.4)

## 2015-12-21 MED ORDER — ATORVASTATIN CALCIUM 10 MG PO TABS
10.0000 mg | ORAL_TABLET | Freq: Every day | ORAL | Status: DC
  Administered 2015-12-21 – 2015-12-27 (×7): 10 mg via ORAL
  Filled 2015-12-21 (×7): qty 1

## 2015-12-21 MED ORDER — ENOXAPARIN SODIUM 60 MG/0.6ML ~~LOC~~ SOLN
55.0000 mg | SUBCUTANEOUS | Status: DC
  Administered 2015-12-21 – 2015-12-23 (×3): 55 mg via SUBCUTANEOUS
  Filled 2015-12-21 (×5): qty 0.6

## 2015-12-21 MED ORDER — INSULIN GLARGINE 100 UNIT/ML ~~LOC~~ SOLN
10.0000 [IU] | Freq: Every day | SUBCUTANEOUS | Status: DC
  Administered 2015-12-21 – 2015-12-28 (×8): 10 [IU] via SUBCUTANEOUS
  Filled 2015-12-21 (×10): qty 0.1

## 2015-12-21 NOTE — Progress Notes (Signed)
Patient ID: Christine Knight, female   DOB: 04/24/1951, 65 y.o.   MRN: [id number]     Advanced Heart Failure Rounding Note    Subjective:    Admitted 12/08/15 with 40+ lb weight gain. 2 weeks of progressive SOB, LE edema, and a 40+ lb weight gain. Baseline weight  267 lbs 07/25/15. Weight 309 lbs on admission.  R/LHC as below consistent with volume overload and pulmonary hypertension.  Echo (1/17) with EF 15-20%, moderate RV systolic dysfunction.   RHC/LHC on 2/1: - Patent bypass grafts, severe native vessel disease. RA mean 23 PA 86/34, mean 56 PCWP mean 37 CI 2.4 PVR 3.2 WU  Milrinone 0.125 added 2/3 to help with diuresis.   Diuresing with lasix drip, diamox, and metolazone. Brisk diuresis noted again. Weight down another 11 Pounds. (63 pounds total) CO-OX down to 64%. Renal function stable. CVP remains 15.    Denies SOB. Having ongoing leg pain but feeling better. Walked 290 feet with CR yesterday   Objective:   Weight Range: 111.8 kg (246 lb 7.6 oz) Body mass index is 38.59 kg/(m^2).   Vital Signs:   Temp:  [97.3 F (36.3 C)-98.5 F (36.9 C)] 98.3 F (36.8 C) (02/09 0748) Pulse Rate:  [63-73] 73 (02/09 0748) Resp:  [17-26] 26 (02/09 0748) BP: (90-111)/(49-83) 110/83 mmHg (02/09 0748) SpO2:  [91 %-98 %] 95 % (02/09 0748) Weight:  [111.8 kg (246 lb 7.6 oz)] 111.8 kg (246 lb 7.6 oz) (02/09 0500) Last BM Date: 12/19/15  Weight change: Filed Weights   12/19/15 0414 12/20/15 0400 12/21/15 0500  Weight: 121.4 kg (267 lb 10.2 oz) 116.892 kg (257 lb 11.2 oz) 111.8 kg (246 lb 7.6 oz)    Intake/Output:   Intake/Output Summary (Last 24 hours) at 12/21/15 0859 Last data filed at 12/21/15 0600  Gross per 24 hour  Intake 1437.8 ml  Output   5755 ml  Net -4317.2 ml     Physical Exam: CVP 15 General:  Lying flat in bed. NAD HEENT: normal Neck: thick supple. JVP to jaw. Carotids 2+ bilat; no bruits. No thyromegaly or nodule noted. Cor: PMI nondisplaced. Distant, RRR. No  rubs, gallops or murmurs. Lungs: clear anteriorly Abdomen: Morbidly obese, soft, NT, ND, no HSM. No bruits or masses. +BS. Remains edematous into flanks. Extremities: no cyanosis, clubbing, rash. 1+ edema R and LLE compression wraps.  Neuro: alert & orientedx3, cranial nerves grossly intact. moves all 4 extremities w/o difficulty. Affect pleasant GU: Foley  Telemetry: Reviewed personally, NSR 70s, Occasional PVCs  Labs: CBC  Recent Labs  12/20/15 1200 12/21/15 0515  WBC 5.1 4.8  HGB 12.9 12.7  HCT 40.4 39.9  MCV 90.2 90.1  PLT 326 328   Basic Metabolic Panel  Recent Labs  12/19/15 0400 12/20/15 0350 12/21/15 0515  NA 131* 131* 133*  K 3.0* 4.1 3.4*  CL 79* 80* 77*  CO2 39* 39* 40*  GLUCOSE 231* 252* 212*  BUN 26* 25* 26*  CREATININE 1.47* 1.47* 1.34*  CALCIUM 9.7 9.5 10.1  MG 2.3  --  2.3   Liver Function Tests No results for input(s): AST, ALT, ALKPHOS, BILITOT, PROT, ALBUMIN in the last 72 hours. No results for input(s): LIPASE, AMYLASE in the last 72 hours. Cardiac Enzymes No results for input(s): CKTOTAL, CKMB, CKMBINDEX, TROPONINI in the last 72 hours.  BNP: BNP (last 3 results)  Recent Labs  05/29/15 1608 06/04/15 0945 12/08/15 0922  BNP 921.7* 340.1* 1155.5*    ProBNP (last 3 results) No  results for input(s): PROBNP in the last 8760 hours.   D-Dimer No results for input(s): DDIMER in the last 72 hours. Hemoglobin A1C No results for input(s): HGBA1C in the last 72 hours. Fasting Lipid Panel No results for input(s): CHOL, HDL, LDLCALC, TRIG, CHOLHDL, LDLDIRECT in the last 72 hours. Thyroid Function Tests No results for input(s): TSH, T4TOTAL, T3FREE, THYROIDAB in the last 72 hours.  Invalid input(s): FREET3   Medications:     Scheduled Medications: . acetaZOLAMIDE  250 mg Oral BID  . antiseptic oral rinse  7 mL Mouth Rinse BID  . aspirin  81 mg Oral Daily  . enoxaparin (LOVENOX) injection  55 mg Subcutaneous Q24H  . insulin aspart   0-15 Units Subcutaneous TID WC  . insulin aspart  0-5 Units Subcutaneous QHS  . metolazone  5 mg Oral BID  . multivitamin with minerals  1 tablet Oral Daily  . potassium chloride  40 mEq Oral TID  . sodium chloride flush  10-40 mL Intracatheter Q12H  . spironolactone  25 mg Oral Daily    Infusions: . sodium chloride 5 mL/hr at 12/18/15 0800  . furosemide (LASIX) infusion 30 mg/hr (12/21/15 0116)  . milrinone 0.125 mcg/kg/min (12/20/15 1800)    PRN Medications: sodium chloride, acetaminophen **OR** acetaminophen, alum & mag hydroxide-simeth, guaiFENesin, levalbuterol, ondansetron **OR** ondansetron (ZOFRAN) IV, oxyCODONE, polyethylene glycol, traMADol   Assessment   1. Acute on chronic CHF: Ischemic cardiomyopathy. NYHA Class IV.  Echo (1/17): EF 15-20% with moderate RV systolic dysfunction.  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  2. CAD s/p CABG x 4 2012: Angiography this admission with patent grafts, severe native vessel disease. 3. NSVT 4. Morbid Obesity 5. Acute on chronic renal failure, stage 3 6. Hypokalemia 7. Hyponatremia  Plan    Volume status continues to improve.  Brisk diuresis ongoing but CVP still 15. Weight down another 11  pounds. Renal function actually improved. Keep off bidil and BB for now.  - Continue milrinone 0.125  - Continue Lasix 30 mg/hr and metolazone 5 bid and acetazolamide 250 mg bid.  - Continue spironolactone  25 mg daily.  - Continue to supp K aggressively  Hgb and co-ox stable.  Appreciate DM2 coordinators eval. Glucose remains high. Will add lantus 10u.   Continue CR and PT. PT recommending HHPT.    Length of Stay: 13  Bensimhon, Daniel,MD 8:59 AM

## 2015-12-21 NOTE — Progress Notes (Signed)
CARDIAC REHAB PHASE I   PRE:  Rate/Rhythm: 87 SR  BP:  Supine:   Sitting: 96/66  Standing:    SaO2: 100% 2L  MODE:  Ambulation: 390 ft   POST:  Rate/Rhythm: 101 ST  BP:  Supine:   Sitting: 122/83  Standing:    SaO2: 97%2L 1422-1500 Pt walked 390 ft on 2L with rolling walker and asst x 1 with steady gait. Tolerated well. To recliner after walk. Stopped once to rest.   Luetta Nutting, RN BSN  12/21/2015 2:57 PM

## 2015-12-22 ENCOUNTER — Ambulatory Visit: Payer: BLUE CROSS/BLUE SHIELD | Admitting: Family Medicine

## 2015-12-22 LAB — GLUCOSE, CAPILLARY
GLUCOSE-CAPILLARY: 208 mg/dL — AB (ref 65–99)
Glucose-Capillary: 231 mg/dL — ABNORMAL HIGH (ref 65–99)
Glucose-Capillary: 237 mg/dL — ABNORMAL HIGH (ref 65–99)
Glucose-Capillary: 208 mg/dL — ABNORMAL HIGH (ref 65–99)

## 2015-12-22 LAB — BASIC METABOLIC PANEL
ANION GAP: 10 (ref 5–15)
BUN: 30 mg/dL — ABNORMAL HIGH (ref 6–20)
CALCIUM: 9.9 mg/dL (ref 8.9–10.3)
CO2: 43 mmol/L — ABNORMAL HIGH (ref 22–32)
Chloride: 79 mmol/L — ABNORMAL LOW (ref 101–111)
Creatinine, Ser: 1.47 mg/dL — ABNORMAL HIGH (ref 0.44–1.00)
GFR, EST AFRICAN AMERICAN: 42 mL/min — AB (ref >60)
GFR, EST NON AFRICAN AMERICAN: 37 mL/min — AB (ref >60)
GLUCOSE: 219 mg/dL — AB (ref 65–99)
Potassium: 3.6 mmol/L (ref 3.5–5.1)
Sodium: 132 mmol/L — ABNORMAL LOW (ref 135–145)

## 2015-12-22 LAB — CARBOXYHEMOGLOBIN
CARBOXYHEMOGLOBIN: 1.4 % (ref 0.5–1.5)
METHEMOGLOBIN: 0.8 % (ref 0.0–1.5)
O2 Saturation: 81.5 %
Total hemoglobin: 13.5 g/dL (ref 12.0–16.0)

## 2015-12-22 MED ORDER — TORSEMIDE 20 MG PO TABS
40.0000 mg | ORAL_TABLET | Freq: Two times a day (BID) | ORAL | Status: DC
  Administered 2015-12-23: 40 mg via ORAL
  Filled 2015-12-22: qty 2

## 2015-12-22 MED ORDER — POTASSIUM CHLORIDE CRYS ER 20 MEQ PO TBCR
40.0000 meq | EXTENDED_RELEASE_TABLET | Freq: Once | ORAL | Status: AC
  Administered 2015-12-22: 40 meq via ORAL
  Filled 2015-12-22: qty 2

## 2015-12-22 NOTE — Progress Notes (Signed)
Inpatient Diabetes Program Recommendations  AACE/ADA: New Consensus Statement on Inpatient Glycemic Control (2015)  Target Ranges:  Prepandial:   less than 140 mg/dL      Peak postprandial:   less than 180 mg/dL (1-2 hours)      Critically ill patients:  140 - 180 mg/dL  Results for Christine Knight, Christine Knight (MRN [id number]) as of 12/22/2015 12:29  Ref. Range 12/21/2015 07:51 12/21/2015 11:45 12/21/2015 16:13 12/21/2015 21:54 12/22/2015 07:27  Glucose-Capillary Latest Ref Range: 65-99 mg/dL 409 (H) 811 (H) 914 (H) 214 (H) 231 (H)   Review of Glycemic Control  Current orders for Inpatient glycemic control: Lantus 10 units daily, Novolog 0-15 units TID with meals, Novolog 0-5 units QHS  Inpatient Diabetes Program Recommendations: Insulin - Basal: Please consider increasing Lantus to 13 units daily. Insulin - Meal Coverage: Please consider ordering Novolog 3 units TID with meals for meal coverage if patients eats at least 50% of meal (in addition to Novolog correction scale).  Thanks, Orlando Penner, RN, MSN, CDE Diabetes Coordinator Inpatient Diabetes Program [phone] (Team Pager from 8am to 5pm) [phone] ([facility] office) [phone] [facility] office) [phone] [facility] office)

## 2015-12-22 NOTE — Progress Notes (Addendum)
Patient ID: Christine Knight, female   DOB: Jul 10, 1951, 65 y.o.   MRN: [id number]     Advanced Heart Failure Rounding Note    Subjective:    Admitted 12/08/15 with 40+ lb weight gain. 2 weeks of progressive SOB, LE edema, and a 40+ lb weight gain. Baseline weight  267 lbs 07/25/15. Weight 309 lbs on admission.  R/LHC as below consistent with volume overload and pulmonary hypertension.  Echo (1/17) with EF 15-20%, moderate RV systolic dysfunction.   RHC/LHC on 2/1: - Patent bypass grafts, severe native vessel disease. RA mean 23 PA 86/34, mean 56 PCWP mean 37 CI 2.4 PVR 3.2 WU  Milrinone 0.125 added 2/3 to help with diuresis.   Diuresing with lasix drip, diamox, and metolazone. Brisk diuresis noted again. Weight down another 5 Pounds. (68 pounds total) CO-OX down to 81% (?). Creatinine up slightly. CVP finally under 10 (was 7mm HG this am - checked personally)  Feeling much better.  Leg pain improving. Walking with CR yesterday   Objective:   Weight Range: 109.408 kg (241 lb 3.2 oz) Body mass index is 37.77 kg/(m^2).   Vital Signs:   Temp:  [98 F (36.7 C)-98.4 F (36.9 C)] 98.3 F (36.8 C) (02/10 0720) Pulse Rate:  [40-143] 81 (02/10 1000) Resp:  [16-27] 27 (02/10 1000) BP: (91-119)/(44-97) 97/44 mmHg (02/10 1000) SpO2:  [77 %-100 %] 97 % (02/10 1000) Weight:  [109.408 kg (241 lb 3.2 oz)] 109.408 kg (241 lb 3.2 oz) (02/10 0400) Last BM Date: 12/22/15  Weight change: Filed Weights   12/20/15 0400 12/21/15 0500 12/22/15 0400  Weight: 116.892 kg (257 lb 11.2 oz) 111.8 kg (246 lb 7.6 oz) 109.408 kg (241 lb 3.2 oz)    Intake/Output:   Intake/Output Summary (Last 24 hours) at 12/22/15 1136 Last data filed at 12/22/15 1000  Gross per 24 hour  Intake 1067.83 ml  Output   3695 ml  Net -2627.17 ml     Physical Exam: CVP 7  General:  Sitting in chair NAD HEENT: normal Neck: thick supple. Carotids 2+ bilat; no bruits. No thyromegaly or nodule noted. Cor: PMI  nondisplaced. Distant, RRR. No rubs, gallops or murmurs. Lungs: clear anteriorly Abdomen: Morbidly obese, soft, NT, ND, no HSM. No bruits or masses. +BS. Remains edematous into flanks. Extremities: no cyanosis, clubbing, rash. Trace edema R and LLE compression wraps.  Neuro: alert & orientedx3, cranial nerves grossly intact. moves all 4 extremities w/o difficulty. Affect pleasant GU: Foley  Telemetry: Reviewed personally, NSR 70s, Occasional PVCs  Labs: CBC  Recent Labs  12/20/15 1200 12/21/15 0515  WBC 5.1 4.8  HGB 12.9 12.7  HCT 40.4 39.9  MCV 90.2 90.1  PLT 326 328   Basic Metabolic Panel  Recent Labs  12/21/15 0515 12/22/15 0425  NA 133* 132*  K 3.4* 3.6  CL 77* 79*  CO2 40* 43*  GLUCOSE 212* 219*  BUN 26* 30*  CREATININE 1.34* 1.47*  CALCIUM 10.1 9.9  MG 2.3  --    Liver Function Tests No results for input(s): AST, ALT, ALKPHOS, BILITOT, PROT, ALBUMIN in the last 72 hours. No results for input(s): LIPASE, AMYLASE in the last 72 hours. Cardiac Enzymes No results for input(s): CKTOTAL, CKMB, CKMBINDEX, TROPONINI in the last 72 hours.  BNP: BNP (last 3 results)  Recent Labs  05/29/15 1608 06/04/15 0945 12/08/15 0922  BNP 921.7* 340.1* 1155.5*    ProBNP (last 3 results) No results for input(s): PROBNP in the last 8760 hours.  D-Dimer No results for input(s): DDIMER in the last 72 hours. Hemoglobin A1C No results for input(s): HGBA1C in the last 72 hours. Fasting Lipid Panel No results for input(s): CHOL, HDL, LDLCALC, TRIG, CHOLHDL, LDLDIRECT in the last 72 hours. Thyroid Function Tests No results for input(s): TSH, T4TOTAL, T3FREE, THYROIDAB in the last 72 hours.  Invalid input(s): FREET3   Medications:     Scheduled Medications: . antiseptic oral rinse  7 mL Mouth Rinse BID  . aspirin  81 mg Oral Daily  . atorvastatin  10 mg Oral q1800  . enoxaparin (LOVENOX) injection  55 mg Subcutaneous Q24H  . insulin aspart  0-15 Units  Subcutaneous TID WC  . insulin aspart  0-5 Units Subcutaneous QHS  . insulin glargine  10 Units Subcutaneous Daily  . multivitamin with minerals  1 tablet Oral Daily  . potassium chloride  40 mEq Oral TID  . sodium chloride flush  10-40 mL Intracatheter Q12H  . spironolactone  25 mg Oral Daily  . [START ON 12/23/2015] torsemide  40 mg Oral BID    Infusions: . sodium chloride Stopped (12/22/15 0945)    PRN Medications: sodium chloride, acetaminophen **OR** acetaminophen, alum & mag hydroxide-simeth, guaiFENesin, levalbuterol, ondansetron **OR** ondansetron (ZOFRAN) IV, oxyCODONE, polyethylene glycol, traMADol   Assessment   1. Acute on chronic CHF: Ischemic cardiomyopathy. NYHA Class IV.  Echo (1/17): EF 15-20% with moderate RV systolic dysfunction.  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  2. CAD s/p CABG x 4 2012: Angiography this admission with patent grafts, severe native vessel disease. 3. NSVT 4. Morbid Obesity 5. Acute on chronic renal failure, stage 3 6. Hypokalemia 7. Hyponatremia  Plan    Volume status much improved. Weight down 68 pounds. CVP finally under 10. Renal function slightly up but close to baseline. Electrolytes stable.  Will stop milrinone, IV lasix, metolazone and acetazolamide. Challenge now will be to find an oral regimen which works. Was on torsemide 40 bid at home. Will restart and hopefully will be more effective now that gut edema improved. May need to increase to 60 bid. Continue spironolactone  25 mg daily. Back down on K.  BP too low to restart carvedilol or Bidil. Given DM2 would also like to consider losartan as tolerated. If SBP > 100 would start half tab Bidil tomorrow.    Appreciate DM2 coordinators eval.  Lantus 10u daily added 2/9   Continue CR and PT. PT recommending HHPT.   Possible home Monday or Tuesday. Would leave in SDU to follow CVPs for now.    Length of Stay: 14  Dash Cardarelli,MD 11:36 AM

## 2015-12-22 NOTE — Progress Notes (Signed)
CARDIAC REHAB PHASE I   PRE:  Rate/Rhythm: 81 SR c/ PVCs  BP:  Sitting: 97/44        SaO2: 95 4L, 97 2L  MODE:  Ambulation: 350 ft   POST:  Rate/Rhythm: 111 ST c/ freq PVCs  BP:  Sitting: 110/68         SaO2: 90-92 2L  Pt ambulated 350 ft on 2L O2, rolling walker, foley catheter, assist x1, steady gait, tolerated well. DOE improved, standing rest x2. Sats 90-92 on 2L O2. Pt smiling, appreciate of walk. Pt to recliner after walk, call bell within reach. Will follow.   1000-1030 Joylene Grapes, RN, BSN 12/22/2015 10:27 AM

## 2015-12-22 NOTE — Progress Notes (Signed)
Physical Therapy Treatment Patient Details Name: Christine Knight MRN: [id number] DOB: 01/08/1951 Today's Date: 12/22/2015    History of Present Illness patient is a 65 yo female admitted 12/08/15 with ma, and a 40+ lb weight gain. 2 weeks of progressive SOB, LE edema, and a 40+ lb weight gain. Findings consistent with volume overload and pulmonary hypertension.    PT Comments    Patient demonstrates steady progress towards PT goals. Patient very motivated and upbeat this session. Discussed desires to return home vs need for medical care. Patient ambulated on room air today with increased distance. She did required multiple rest breaks with some desaturation to 89% but improved with rest and breath control. Upon returning to room, patient replaced on 2 liters with saturations increasing to 95%.    Follow Up Recommendations  Home health PT;Supervision for mobility/OOB     Equipment Recommendations  Rolling walker with 5" wheels    Recommendations for Other Services       Precautions / Restrictions Precautions Precautions: Fall Restrictions Weight Bearing Restrictions: No    Mobility  Bed Mobility               General bed mobility comments: received in chair  Transfers Overall transfer level: Needs assistance Equipment used: Rolling walker (2 wheeled) Transfers: Sit to/from Stand Sit to Stand: Supervision;Min guard         General transfer comment: supervision with one cue for hand placement upon standing, poorly controlled descent to chair upon sitting. Educated regarding controlled movement  Ambulation/Gait Ambulation/Gait assistance: Supervision Ambulation Distance (Feet): 480 Feet Assistive device: Rolling walker (2 wheeled) Gait Pattern/deviations: Step-through pattern;Decreased stride length;Wide base of support;Trunk flexed;Shuffle Gait velocity: decreased Gait velocity interpretation: Below normal speed for age/gender General Gait Details: ambulated on  room air with 3 standing rest breaks. O2 saturations on room air dropping to 88% but improved with pursed lip breathing and extended rest. (ambulated on room air dest to 89%)   Stairs            Wheelchair Mobility    Modified Rankin (Stroke Patients Only)       Balance   Sitting-balance support: Feet supported Sitting balance-[name] Scale: Good     Standing balance support: During functional activity Standing balance-[name] Scale: Fair                      Cognition Arousal/Alertness: Awake/alert Behavior During Therapy: WFL for tasks assessed/performed Overall Cognitive Status: Within Functional Limits for tasks assessed                      Exercises      General Comments General comments (skin integrity, edema, etc.): patient was able to verbalize education for edema control as discussed last session.       Pertinent Vitals/Pain Pain Assessment: No/denies pain    Home Living                      Prior Function            PT Goals (current goals can now be found in the care plan section) Acute Rehab PT Goals Patient Stated Goal: to go home PT Goal Formulation: With patient Time For Goal Achievement: 01/01/16 Potential to Achieve Goals: Good Progress towards PT goals: Progressing toward goals    Frequency  Min 3X/week    PT Plan Current plan remains appropriate    Co-evaluation  End of Session Equipment Utilized During Treatment: Gait belt;Oxygen (2 liters at end of session) Activity Tolerance: Patient tolerated treatment well Patient left: in chair;with call bell/phone within reach     Time: 1422-1445 PT Time Calculation (min) (ACUTE ONLY): 23 min  Charges:  $Gait Training: 8-22 mins $Self Care/Home Management: 8-22                    G CodesFabio Asa 01-19-2016, 4:34 PM Charlotte Crumb, PT DPT  [phone]

## 2015-12-23 LAB — GLUCOSE, CAPILLARY
GLUCOSE-CAPILLARY: 205 mg/dL — AB (ref 65–99)
Glucose-Capillary: 183 mg/dL — ABNORMAL HIGH (ref 65–99)
Glucose-Capillary: 228 mg/dL — ABNORMAL HIGH (ref 65–99)

## 2015-12-23 LAB — BASIC METABOLIC PANEL
ANION GAP: 14 (ref 5–15)
BUN: 35 mg/dL — ABNORMAL HIGH (ref 6–20)
CALCIUM: 9.8 mg/dL (ref 8.9–10.3)
CO2: 39 mmol/L — ABNORMAL HIGH (ref 22–32)
Chloride: 80 mmol/L — ABNORMAL LOW (ref 101–111)
Creatinine, Ser: 1.55 mg/dL — ABNORMAL HIGH (ref 0.44–1.00)
GFR, EST AFRICAN AMERICAN: 40 mL/min — AB (ref >60)
GFR, EST NON AFRICAN AMERICAN: 34 mL/min — AB (ref >60)
Glucose, Bld: 192 mg/dL — ABNORMAL HIGH (ref 65–99)
POTASSIUM: 3.5 mmol/L (ref 3.5–5.1)
Sodium: 133 mmol/L — ABNORMAL LOW (ref 135–145)

## 2015-12-23 MED ORDER — ISOSORB DINITRATE-HYDRALAZINE 20-37.5 MG PO TABS
0.5000 | ORAL_TABLET | Freq: Three times a day (TID) | ORAL | Status: DC
  Administered 2015-12-23 – 2015-12-24 (×6): 0.5 via ORAL
  Filled 2015-12-23 (×6): qty 1

## 2015-12-23 MED ORDER — TORSEMIDE 20 MG PO TABS
60.0000 mg | ORAL_TABLET | Freq: Two times a day (BID) | ORAL | Status: DC
  Administered 2015-12-23 – 2015-12-24 (×2): 60 mg via ORAL
  Filled 2015-12-23 (×2): qty 3

## 2015-12-23 MED ORDER — POTASSIUM CHLORIDE CRYS ER 20 MEQ PO TBCR
40.0000 meq | EXTENDED_RELEASE_TABLET | Freq: Every day | ORAL | Status: DC
  Administered 2015-12-23 – 2015-12-24 (×2): 40 meq via ORAL
  Filled 2015-12-23 (×2): qty 2

## 2015-12-23 NOTE — Progress Notes (Signed)
Patient ID: Christine Knight, female   DOB: 12/25/1950, 65 y.o.   MRN: [id number]     Advanced Heart Failure Rounding Note    Subjective:    Admitted 12/08/15 with 40+ lb weight gain. 2 weeks of progressive SOB, LE edema, and a 40+ lb weight gain. Baseline weight  267 lbs 07/25/15. Weight 309 lbs on admission.  R/LHC as below consistent with volume overload and pulmonary hypertension.  Echo (1/17) with EF 15-20%, moderate RV systolic dysfunction.   RHC/LHC on 2/1: - Patent bypass grafts, severe native vessel disease. RA mean 23 PA 86/34, mean 56 PCWP mean 37 CI 2.4 PVR 3.2 WU  Milrinone 0.125 added 2/3 to help with diuresis.   IV Lasix, milrinone, and acetazolamide stopped yesterday.  Weight down 1 lb.  Now on torsemide 40 mg bid.   Feeling stronger, continuing to walk in halls with cardiac rehab.    Objective:   Weight Range: 240 lb 11.9 oz (109.2 kg) Body mass index is 37.7 kg/(m^2).   Vital Signs:   Temp:  [97.3 F (36.3 C)-98.8 F (37.1 C)] 97.6 F (36.4 C) (02/11 1122) Pulse Rate:  [66-82] 71 (02/11 0751) Resp:  [11-21] 15 (02/11 1122) BP: (86-112)/(59-74) 102/73 mmHg (02/11 1122) SpO2:  [92 %-98 %] 98 % (02/11 1122) Weight:  [240 lb 11.9 oz (109.2 kg)] 240 lb 11.9 oz (109.2 kg) (02/11 0418) Last BM Date: 12/22/15  Weight change: Filed Weights   12/21/15 0500 12/22/15 0400 12/23/15 0418  Weight: 246 lb 7.6 oz (111.8 kg) 241 lb 3.2 oz (109.408 kg) 240 lb 11.9 oz (109.2 kg)    Intake/Output:   Intake/Output Summary (Last 24 hours) at 12/23/15 1127 Last data filed at 12/23/15 0941  Gross per 24 hour  Intake    800 ml  Output   1380 ml  Net   -580 ml     Physical Exam: CVP 11 (checked personally)  General:  Sitting in chair NAD HEENT: normal Neck: JVP 8-9 cm. Carotids 2+ bilat; no bruits. No thyromegaly or nodule noted. Cor: PMI nondisplaced. Distant, RRR. No rubs, gallops or murmurs. Lungs: clear anteriorly Abdomen: Morbidly obese, soft, NT, ND, no  HSM. No bruits or masses. +BS. Remains edematous into flanks. Extremities: no cyanosis, clubbing, rash. Trace edema R and LLE compression wraps.  Neuro: alert & orientedx3, cranial nerves grossly intact. moves all 4 extremities w/o difficulty. Affect pleasant GU: Foley  Telemetry: Reviewed personally, NSR 70s, Occasional PVCs  Labs: CBC  Recent Labs  12/20/15 1200 12/21/15 0515  WBC 5.1 4.8  HGB 12.9 12.7  HCT 40.4 39.9  MCV 90.2 90.1  PLT 326 328   Basic Metabolic Panel  Recent Labs  12/21/15 0515 12/22/15 0425 12/23/15 0444  NA 133* 132* 133*  K 3.4* 3.6 3.5  CL 77* 79* 80*  CO2 40* 43* 39*  GLUCOSE 212* 219* 192*  BUN 26* 30* 35*  CREATININE 1.34* 1.47* 1.55*  CALCIUM 10.1 9.9 9.8  MG 2.3  --   --    Liver Function Tests No results for input(s): AST, ALT, ALKPHOS, BILITOT, PROT, ALBUMIN in the last 72 hours. No results for input(s): LIPASE, AMYLASE in the last 72 hours. Cardiac Enzymes No results for input(s): CKTOTAL, CKMB, CKMBINDEX, TROPONINI in the last 72 hours.  BNP: BNP (last 3 results)  Recent Labs  05/29/15 1608 06/04/15 0945 12/08/15 0922  BNP 921.7* 340.1* 1155.5*    ProBNP (last 3 results) No results for input(s): PROBNP in the last 8760  hours.   D-Dimer No results for input(s): DDIMER in the last 72 hours. Hemoglobin A1C No results for input(s): HGBA1C in the last 72 hours. Fasting Lipid Panel No results for input(s): CHOL, HDL, LDLCALC, TRIG, CHOLHDL, LDLDIRECT in the last 72 hours. Thyroid Function Tests No results for input(s): TSH, T4TOTAL, T3FREE, THYROIDAB in the last 72 hours.  Invalid input(s): FREET3   Medications:     Scheduled Medications: . antiseptic oral rinse  7 mL Mouth Rinse BID  . aspirin  81 mg Oral Daily  . atorvastatin  10 mg Oral q1800  . enoxaparin (LOVENOX) injection  55 mg Subcutaneous Q24H  . insulin aspart  0-15 Units Subcutaneous TID WC  . insulin aspart  0-5 Units Subcutaneous QHS  . insulin  glargine  10 Units Subcutaneous Daily  . isosorbide-hydrALAZINE  0.5 tablet Oral TID  . multivitamin with minerals  1 tablet Oral Daily  . sodium chloride flush  10-40 mL Intracatheter Q12H  . spironolactone  25 mg Oral Daily  . torsemide  40 mg Oral BID    Infusions: . sodium chloride Stopped (12/22/15 0945)    PRN Medications: sodium chloride, acetaminophen **OR** acetaminophen, alum & mag hydroxide-simeth, guaiFENesin, levalbuterol, ondansetron **OR** ondansetron (ZOFRAN) IV, oxyCODONE, polyethylene glycol, traMADol   Assessment   1. Acute on chronic CHF: Ischemic cardiomyopathy. NYHA Class IV.  Echo (1/17): EF 15-20% with moderate RV systolic dysfunction.  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  2. CAD s/p CABG x 4 2012: Angiography this admission with patent grafts, severe native vessel disease. 3. NSVT 4. Morbid Obesity 5. Acute on chronic renal failure, stage 3 6. Hypokalemia 7. Hyponatremia  Plan    Volume status much improved compared to earlier in stay.  CVP remains mildly elevated at 11.  - I will increase torsemide to 60 mg bid.  She was on 40 mg bid prior to admission.  - Keep in SDU today and follow CVP.  - Continue spironolactone 25 mg daily.  SBP running 100-110.  I will try her on Bidil 1/2 tablet tid today.     Creatinine mildly higher at 1.55, follow closely.   Appreciate DM2 coordinators eval.  Lantus 10 u daily added 2/9   Continue CR and PT. PT recommending HHPT.   Possible home Monday or Tuesday.    Length of Stay: 15  Remee Charley,MD 11:27 AM  12/23/2015

## 2015-12-23 NOTE — Progress Notes (Signed)
CARDIAC REHAB PHASE I   PRE:  Rate/Rhythm: 72 NSR with PVCs  BP:  Sitting: 116/74      SaO2: 95 RA  MODE:  Ambulation: 350 ft   POST:  Rate/Rhythm: 112 ST with PVCs  BP:  Sitting: 107/77     SaO2: 91 RA 1326-1347 Patient ambulated x 1 assist with RW. Steady gait noted. Denied complaints. Post ambulation patient to chair with call bell and phone in reach. Encouraged patient to keep legs elevated. Encouraged patient to walk again later today with staff RN. Will follow up on Monday.  Maude Leriche, BSN 12/23/2015 1:48 PM

## 2015-12-24 LAB — CARBOXYHEMOGLOBIN
Carboxyhemoglobin: 1.3 % (ref 0.5–1.5)
METHEMOGLOBIN: 0.8 % (ref 0.0–1.5)
O2 Saturation: 56.6 %
TOTAL HEMOGLOBIN: 15 g/dL (ref 12.0–16.0)

## 2015-12-24 LAB — BASIC METABOLIC PANEL
ANION GAP: 16 — AB (ref 5–15)
BUN: 44 mg/dL — AB (ref 6–20)
CO2: 37 mmol/L — ABNORMAL HIGH (ref 22–32)
Calcium: 9.8 mg/dL (ref 8.9–10.3)
Chloride: 81 mmol/L — ABNORMAL LOW (ref 101–111)
Creatinine, Ser: 1.72 mg/dL — ABNORMAL HIGH (ref 0.44–1.00)
GFR calc Af Amer: 35 mL/min — ABNORMAL LOW (ref >60)
GFR, EST NON AFRICAN AMERICAN: 30 mL/min — AB (ref >60)
Glucose, Bld: 189 mg/dL — ABNORMAL HIGH (ref 65–99)
POTASSIUM: 3.7 mmol/L (ref 3.5–5.1)
SODIUM: 134 mmol/L — AB (ref 135–145)

## 2015-12-24 LAB — GLUCOSE, CAPILLARY
GLUCOSE-CAPILLARY: 223 mg/dL — AB (ref 65–99)
GLUCOSE-CAPILLARY: 196 mg/dL — AB (ref 65–99)
GLUCOSE-CAPILLARY: 238 mg/dL — AB (ref 65–99)
GLUCOSE-CAPILLARY: 221 mg/dL — AB (ref 65–99)
Glucose-Capillary: 250 mg/dL — ABNORMAL HIGH (ref 65–99)

## 2015-12-24 LAB — CBC
HCT: 43 % (ref 36.0–46.0)
HEMOGLOBIN: 14.1 g/dL (ref 12.0–15.0)
MCH: 29.2 pg (ref 26.0–34.0)
MCHC: 32.8 g/dL (ref 30.0–36.0)
MCV: 89 fL (ref 78.0–100.0)
PLATELETS: 415 10*3/uL — AB (ref 150–400)
RBC: 4.83 MIL/uL (ref 3.87–5.11)
RDW: 15.9 % — ABNORMAL HIGH (ref 11.5–15.5)
WBC: 6 10*3/uL (ref 4.0–10.5)

## 2015-12-24 MED ORDER — TORSEMIDE 20 MG PO TABS: 60.0000 mg | ORAL_TABLET | Freq: Every day | ORAL | Status: DC

## 2015-12-24 MED ORDER — TORSEMIDE 20 MG PO TABS
40.0000 mg | ORAL_TABLET | Freq: Every evening | ORAL | Status: DC
  Administered 2015-12-24: 40 mg via ORAL
  Filled 2015-12-24: qty 2

## 2015-12-24 NOTE — Progress Notes (Signed)
Patient ID: Christine Knight, female   DOB: Jun 15, 1951, 65 y.o.   MRN: [id number]     Advanced Heart Failure Rounding Note    Subjective:    Admitted 12/08/15 with 40+ lb weight gain. 2 weeks of progressive SOB, LE edema, and a 40+ lb weight gain. Baseline weight  267 lbs 07/25/15. Weight 309 lbs on admission.  R/LHC as below consistent with volume overload and pulmonary hypertension.  Echo (1/17) with EF 15-20%, moderate RV systolic dysfunction.   RHC/LHC on 2/1: - Patent bypass grafts, severe native vessel disease. RA mean 23 PA 86/34, mean 56 PCWP mean 37 CI 2.4 PVR 3.2 WU  Milrinone 0.125 added 2/3 to help with diuresis.   IV Lasix, milrinone, and acetazolamide stopped 2/10.  Weight down 1 lb again today.  Now on torsemide 60 mg bid. CVP 11 today with co-ox 57%.  Feeling stronger, continuing to walk in halls with cardiac rehab.    Objective:   Weight Range: 239 lb 8 oz (108.636 kg) Body mass index is 37.5 kg/(m^2).   Vital Signs:   Temp:  [97.7 F (36.5 C)-98.4 F (36.9 C)] 98.2 F (36.8 C) (02/12 1132) Pulse Rate:  [82] 82 (02/12 0421) Resp:  [18-27] 25 (02/12 0740) BP: (93-105)/(44-73) 93/44 mmHg (02/12 1132) SpO2:  [94 %-97 %] 94 % (02/12 1132) Weight:  [239 lb 8 oz (108.636 kg)] 239 lb 8 oz (108.636 kg) (02/12 0400) Last BM Date: 12/23/15  Weight change: Filed Weights   12/22/15 0400 12/23/15 0418 12/24/15 0400  Weight: 241 lb 3.2 oz (109.408 kg) 240 lb 11.9 oz (109.2 kg) 239 lb 8 oz (108.636 kg)    Intake/Output:   Intake/Output Summary (Last 24 hours) at 12/24/15 1235 Last data filed at 12/24/15 1100  Gross per 24 hour  Intake    480 ml  Output   1050 ml  Net   -570 ml     Physical Exam: CVP 11 (checked personally)  General:  Sitting in chair NAD HEENT: normal Neck: JVP 8-9 cm. Carotids 2+ bilat; no bruits. No thyromegaly or nodule noted. Cor: PMI nondisplaced. Distant, RRR. No rubs, gallops or murmurs. Lungs: clear anteriorly Abdomen:  Morbidly obese, soft, NT, ND, no HSM. No bruits or masses. +BS.  Extremities: no cyanosis, clubbing, rash. 1+ ankle edema.  Neuro: alert & orientedx3, cranial nerves grossly intact. moves all 4 extremities w/o difficulty. Affect pleasant GU: Foley  Telemetry: Reviewed personally, NSR 70s, Occasional PVCs  Labs: CBC  Recent Labs  12/24/15 0420  WBC 6.0  HGB 14.1  HCT 43.0  MCV 89.0  PLT 415*   Basic Metabolic Panel  Recent Labs  12/23/15 0444 12/24/15 0420  NA 133* 134*  K 3.5 3.7  CL 80* 81*  CO2 39* 37*  GLUCOSE 192* 189*  BUN 35* 44*  CREATININE 1.55* 1.72*  CALCIUM 9.8 9.8   Liver Function Tests No results for input(s): AST, ALT, ALKPHOS, BILITOT, PROT, ALBUMIN in the last 72 hours. No results for input(s): LIPASE, AMYLASE in the last 72 hours. Cardiac Enzymes No results for input(s): CKTOTAL, CKMB, CKMBINDEX, TROPONINI in the last 72 hours.  BNP: BNP (last 3 results)  Recent Labs  05/29/15 1608 06/04/15 0945 12/08/15 0922  BNP 921.7* 340.1* 1155.5*    ProBNP (last 3 results) No results for input(s): PROBNP in the last 8760 hours.   D-Dimer No results for input(s): DDIMER in the last 72 hours. Hemoglobin A1C No results for input(s): HGBA1C in the last 72 hours.  Fasting Lipid Panel No results for input(s): CHOL, HDL, LDLCALC, TRIG, CHOLHDL, LDLDIRECT in the last 72 hours. Thyroid Function Tests No results for input(s): TSH, T4TOTAL, T3FREE, THYROIDAB in the last 72 hours.  Invalid input(s): FREET3   Medications:     Scheduled Medications: . antiseptic oral rinse  7 mL Mouth Rinse BID  . aspirin  81 mg Oral Daily  . atorvastatin  10 mg Oral q1800  . enoxaparin (LOVENOX) injection  55 mg Subcutaneous Q24H  . insulin aspart  0-15 Units Subcutaneous TID WC  . insulin aspart  0-5 Units Subcutaneous QHS  . insulin glargine  10 Units Subcutaneous Daily  . isosorbide-hydrALAZINE  0.5 tablet Oral TID  . multivitamin with minerals  1 tablet Oral  Daily  . potassium chloride  40 mEq Oral Daily  . sodium chloride flush  10-40 mL Intracatheter Q12H  . spironolactone  25 mg Oral Daily  . torsemide  40 mg Oral QPM  . [START ON 12/25/2015] torsemide  60 mg Oral QPC breakfast    Infusions: . sodium chloride Stopped (12/22/15 0945)    PRN Medications: sodium chloride, acetaminophen **OR** acetaminophen, alum & mag hydroxide-simeth, guaiFENesin, levalbuterol, ondansetron **OR** ondansetron (ZOFRAN) IV, oxyCODONE, polyethylene glycol, traMADol   Assessment   1. Acute on chronic CHF: Ischemic cardiomyopathy. NYHA Class IV.  Echo (1/17): EF 15-20% with moderate RV systolic dysfunction.  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  2. CAD s/p CABG x 4 2012: Angiography this admission with patent grafts, severe native vessel disease. 3. NSVT 4. Morbid Obesity 5. Acute on chronic renal failure, stage 3 6. Hypokalemia 7. Hyponatremia  Plan    Volume status much improved compared to earlier in stay.  CVP remains mildly elevated at 11 still.  Co-ox adequate but marginal at 57%.  - Lower torsemide at bit to 60 qam/40 qpm as BUN/creatinine continue to rise.   - Continue to follow CVP.  - Continue spironolactone 25 mg daily.  She is tolerating Bidil 1/2 tab tid so far but no room to titrate.      Continue to follow creatinine closely, slowly rising.   Continue CR and PT. PT recommending HHPT.   Home Monday or Tuesday.   Length of Stay: 16  Hunt Zajicek,MD 12:35 PM  12/24/2015

## 2015-12-25 LAB — CARBOXYHEMOGLOBIN
CARBOXYHEMOGLOBIN: 1.8 % — AB (ref 0.5–1.5)
METHEMOGLOBIN: 0.6 % (ref 0.0–1.5)
O2 SAT: 66.1 %
Total hemoglobin: 13.6 g/dL (ref 12.0–16.0)

## 2015-12-25 LAB — GLUCOSE, CAPILLARY
GLUCOSE-CAPILLARY: 219 mg/dL — AB (ref 65–99)
Glucose-Capillary: 198 mg/dL — ABNORMAL HIGH (ref 65–99)
Glucose-Capillary: 197 mg/dL — ABNORMAL HIGH (ref 65–99)

## 2015-12-25 LAB — BASIC METABOLIC PANEL
Anion gap: 15 (ref 5–15)
BUN: 51 mg/dL — AB (ref 6–20)
CO2: 33 mmol/L — ABNORMAL HIGH (ref 22–32)
CREATININE: 1.9 mg/dL — AB (ref 0.44–1.00)
Calcium: 9.8 mg/dL (ref 8.9–10.3)
Chloride: 86 mmol/L — ABNORMAL LOW (ref 101–111)
GFR calc Af Amer: 31 mL/min — ABNORMAL LOW (ref >60)
GFR calc non Af Amer: 27 mL/min — ABNORMAL LOW (ref >60)
GLUCOSE: 178 mg/dL — AB (ref 65–99)
POTASSIUM: 2.9 mmol/L — AB (ref 3.5–5.1)
Sodium: 134 mmol/L — ABNORMAL LOW (ref 135–145)

## 2015-12-25 LAB — CBC
HCT: 40.3 % (ref 36.0–46.0)
Hemoglobin: 13.5 g/dL (ref 12.0–15.0)
MCH: 29.8 pg (ref 26.0–34.0)
MCHC: 33.5 g/dL (ref 30.0–36.0)
MCV: 89 fL (ref 78.0–100.0)
Platelets: 372 10*3/uL (ref 150–400)
RBC: 4.53 MIL/uL (ref 3.87–5.11)
RDW: 16.1 % — AB (ref 11.5–15.5)
WBC: 5.6 10*3/uL (ref 4.0–10.5)

## 2015-12-25 MED ORDER — POTASSIUM CHLORIDE CRYS ER 20 MEQ PO TBCR
40.0000 meq | EXTENDED_RELEASE_TABLET | Freq: Once | ORAL | Status: AC
  Administered 2015-12-25: 40 meq via ORAL

## 2015-12-25 MED ORDER — POTASSIUM CHLORIDE CRYS ER 20 MEQ PO TBCR
40.0000 meq | EXTENDED_RELEASE_TABLET | Freq: Every day | ORAL | Status: DC
  Filled 2015-12-25: qty 2

## 2015-12-25 MED ORDER — POTASSIUM CHLORIDE CRYS ER 20 MEQ PO TBCR
40.0000 meq | EXTENDED_RELEASE_TABLET | Freq: Every day | ORAL | Status: DC
  Administered 2015-12-25 – 2015-12-26 (×2): 40 meq via ORAL
  Filled 2015-12-25 (×3): qty 2

## 2015-12-25 MED ORDER — POTASSIUM CHLORIDE CRYS ER 20 MEQ PO TBCR
40.0000 meq | EXTENDED_RELEASE_TABLET | Freq: Once | ORAL | Status: AC
  Administered 2015-12-25: 40 meq via ORAL
  Filled 2015-12-25: qty 2

## 2015-12-25 MED ORDER — POTASSIUM CHLORIDE CRYS ER 20 MEQ PO TBCR
20.0000 meq | EXTENDED_RELEASE_TABLET | Freq: Once | ORAL | Status: AC
  Administered 2015-12-25: 20 meq via ORAL
  Filled 2015-12-25: qty 1

## 2015-12-25 NOTE — Progress Notes (Signed)
Inpatient Diabetes Program Recommendations  AACE/ADA: New Consensus Statement on Inpatient Glycemic Control (2015)  Target Ranges:  Prepandial:   less than 140 mg/dL      Peak postprandial:   less than 180 mg/dL (1-2 hours)      Critically ill patients:  140 - 180 mg/dL   Review of Glycemic Control: Results for Christine Knight, Christine Knight (MRN [id number]) as of 12/25/2015 11:14  Ref. Range 12/24/2015 07:42 12/24/2015 11:31 12/24/2015 16:17 12/24/2015 21:31 12/25/2015 07:27  Glucose-Capillary Latest Ref Range: 65-99 mg/dL 865 (H) 784 (H) 696 (H) 221 (H) 198 (H)     Diabetes history: Type 2 diabetes Outpatient Diabetes medications: Janumet 50-1000 mg bid, Glipizide 10 mg bid Current orders for Inpatient glycemic control:  Lantus 10 units daily, Novolog moderate tid with meals and HS  Inpatient Diabetes Program Recommendations: Insulin - Basal: Please consider increasing Lantus to 13 units daily. Insulin - Meal Coverage: Please consider ordering Novolog 3 units TID with meals for meal coverage if patients eats at least 50% of meal (in addition to Novolog correction scale).  Thanks, Beryl Meager, RN, BC-ADM Inpatient Diabetes Coordinator Pager [phone] (8a-5p)

## 2015-12-25 NOTE — Progress Notes (Addendum)
Patient ID: Christine Knight, female   DOB: 11/09/1951, 65 y.o.   MRN: [id number]     Advanced Heart Failure Rounding Note    Subjective:    Admitted 12/08/15 with 40+ lb weight gain. 2 weeks of progressive SOB, LE edema, and a 40+ lb weight gain. Baseline weight  267 lbs 07/25/15. Weight 309 lbs on admission.  R/LHC as below consistent with volume overload and pulmonary hypertension.  Echo (1/17) with EF 15-20%, moderate RV systolic dysfunction.   RHC/LHC on 2/1: - Patent bypass grafts, severe native vessel disease. RA mean 23 PA 86/34, mean 56 PCWP mean 37 CI 2.4 PVR 3.2 WU  Milrinone 0.125 added 2/3 to help with diuresis.   IV Lasix, milrinone, and acetazolamide stopped 2/10.  Yesterday torsemide was cut back. Weight down 2 llb again today.  Creatinine trending up.    Denies SOB/Orthopnea.    Objective:   Weight Range: 237 lb 9.6 oz (107.775 kg) Body mass index is 37.2 kg/(m^2).   Vital Signs:   Temp:  [97.6 F (36.4 C)-98.5 F (36.9 C)] 98.2 F (36.8 C) (02/13 0719) Pulse Rate:  [73] 73 (02/13 0719) Resp:  [17-25] 18 (02/13 0719) BP: (93-115)/(44-69) 106/69 mmHg (02/13 0719) SpO2:  [92 %-99 %] 92 % (02/13 0719) Weight:  [237 lb 9.6 oz (107.775 kg)] 237 lb 9.6 oz (107.775 kg) (02/13 0351) Last BM Date: 12/24/15  Weight change: Filed Weights   12/23/15 0418 12/24/15 0400 12/25/15 0351  Weight: 240 lb 11.9 oz (109.2 kg) 239 lb 8 oz (108.636 kg) 237 lb 9.6 oz (107.775 kg)    Intake/Output:   Intake/Output Summary (Last 24 hours) at 12/25/15 0726 Last data filed at 12/25/15 0400  Gross per 24 hour  Intake    600 ml  Output   2350 ml  Net  -1750 ml     Physical Exam:  General:  Sitting in chair NAD HEENT: normal Neck: JVP 6-7 cm. Carotids 2+ bilat; no bruits. No thyromegaly or nodule noted. Cor: PMI nondisplaced. Distant, RRR. No rubs, gallops or murmurs. Lungs: clear anteriorly Abdomen: Morbidly obese, soft, NT, ND, no HSM. No bruits or masses. +BS.    Extremities: no cyanosis, clubbing, rash. No edema.   Neuro: alert & orientedx3, cranial nerves grossly intact. moves all 4 extremities w/o difficulty. Affect pleasant   Telemetry: Reviewed personally, NSR 70s, Occasional PVCs  Labs: CBC  Recent Labs  12/24/15 0420 12/25/15 0533  WBC 6.0 5.6  HGB 14.1 13.5  HCT 43.0 40.3  MCV 89.0 89.0  PLT 415* 372   Basic Metabolic Panel  Recent Labs  12/24/15 0420 12/25/15 0533  NA 134* 134*  K 3.7 2.9*  CL 81* 86*  CO2 37* 33*  GLUCOSE 189* 178*  BUN 44* 51*  CREATININE 1.72* 1.90*  CALCIUM 9.8 9.8   Liver Function Tests No results for input(s): AST, ALT, ALKPHOS, BILITOT, PROT, ALBUMIN in the last 72 hours. No results for input(s): LIPASE, AMYLASE in the last 72 hours. Cardiac Enzymes No results for input(s): CKTOTAL, CKMB, CKMBINDEX, TROPONINI in the last 72 hours.  BNP: BNP (last 3 results)  Recent Labs  05/29/15 1608 06/04/15 0945 12/08/15 0922  BNP 921.7* 340.1* 1155.5*    ProBNP (last 3 results) No results for input(s): PROBNP in the last 8760 hours.   D-Dimer No results for input(s): DDIMER in the last 72 hours. Hemoglobin A1C No results for input(s): HGBA1C in the last 72 hours. Fasting Lipid Panel No results for input(s): CHOL,  HDL, LDLCALC, TRIG, CHOLHDL, LDLDIRECT in the last 72 hours. Thyroid Function Tests No results for input(s): TSH, T4TOTAL, T3FREE, THYROIDAB in the last 72 hours.  Invalid input(s): FREET3   Medications:     Scheduled Medications: . antiseptic oral rinse  7 mL Mouth Rinse BID  . aspirin  81 mg Oral Daily  . atorvastatin  10 mg Oral q1800  . enoxaparin (LOVENOX) injection  55 mg Subcutaneous Q24H  . insulin aspart  0-15 Units Subcutaneous TID WC  . insulin aspart  0-5 Units Subcutaneous QHS  . insulin glargine  10 Units Subcutaneous Daily  . isosorbide-hydrALAZINE  0.5 tablet Oral TID  . multivitamin with minerals  1 tablet Oral Daily  . potassium chloride  40 mEq  Oral Daily  . sodium chloride flush  10-40 mL Intracatheter Q12H  . spironolactone  25 mg Oral Daily  . torsemide  40 mg Oral QPM  . torsemide  60 mg Oral QPC breakfast    Infusions: . sodium chloride Stopped (12/22/15 0945)    PRN Medications: sodium chloride, acetaminophen **OR** acetaminophen, alum & mag hydroxide-simeth, guaiFENesin, levalbuterol, ondansetron **OR** ondansetron (ZOFRAN) IV, oxyCODONE, polyethylene glycol, traMADol   Assessment   1. Acute on chronic CHF: Ischemic cardiomyopathy. NYHA Class IV.  Echo (1/17): EF 15-20% with moderate RV systolic dysfunction.  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  2. CAD s/p CABG x 4 2012: Angiography this admission with patent grafts, severe native vessel disease. 3. NSVT 4. Morbid Obesity 5. Acute on chronic renal failure, stage 3 6. Hypokalemia 7. Hyponatremia  Plan    Volume status much improved. Overall she has diuresed  72 pounds. Creatinine continues to rise. Hold torsemide today.  Continue spironolactone 25 mg daily with hypokalemia.   She is tolerating Bidil 1/2 tab tid so far but no room to titrate.      Continue to follow creatinine closely, slowly rising.   Continue CR and PT. PT recommending HHPT.    Transfer to telemetry.   Length of Stay: 17  [name] NP-C  7:26 AM  12/25/2015  Patient seen and examined with Tonye Becket, NP. We discussed all aspects of the encounter. I agree with the assessment and plan as stated above.   She is much improved but creatinine still rising. Hold all diuretics. Encourage po intake. Supp K+ aggressively. Hold Bidil and spiro to avoid hypotension. Continue CR/PT. Can go to tele. Need to see renal function leveling out prior to discharge.   Malakye Nolden,MD 8:56 AM

## 2015-12-25 NOTE — Progress Notes (Signed)
Physical Therapy Treatment Patient Details Name: Christine Knight MRN: [id number] DOB: Mar 16, 1951 Today's Date: 12/25/2015    History of Present Illness patient is a 65 yo female admitted 12/08/15 with ma, and a 40+ lb weight gain. 2 weeks of progressive SOB, LE edema, and a 40+ lb weight gain. Findings consistent with volume overload and pulmonary hypertension.    PT Comments    Patient making steady progress towards PT goals. Ambulated on room air without assistive device this session. Tolerated well. Will continue to see and progress as tolerated.  Follow Up Recommendations  Home health PT;Supervision for mobility/OOB     Equipment Recommendations  Rolling walker with 5" wheels    Recommendations for Other Services       Precautions / Restrictions Precautions Precautions: Fall Restrictions Weight Bearing Restrictions: No    Mobility  Bed Mobility               General bed mobility comments: received performing hygiene activities in standing  Transfers Overall transfer level: Needs assistance Equipment used: Rolling walker (2 wheeled)   Sit to Stand: Supervision;Min guard         General transfer comment: supervision with one cue for hand placement upon standing, poorly controlled descent to chair upon sitting. Educated regarding controlled movement  Ambulation/Gait Ambulation/Gait assistance: Supervision (independent in room) Ambulation Distance (Feet): 360 Feet Assistive device: None   Gait velocity: decreased   General Gait Details: ambulated on room air without assistive device, tolerated well. Some increased fatigue noted today.  (ambulated without RW this session)   Stairs            Wheelchair Mobility    Modified Rankin (Stroke Patients Only)       Balance   Sitting-balance support: Feet supported Sitting balance-[name] Scale: Good     Standing balance support: During functional activity Standing balance-[name] Scale:  Fair Standing balance comment: no need for assistive device this session                    Cognition Arousal/Alertness: Awake/alert Behavior During Therapy: WFL for tasks assessed/performed Overall Cognitive Status: Within Functional Limits for tasks assessed                      Exercises      General Comments General comments (skin integrity, edema, etc.): discussed energy conservation techniques for anticipation of discharge. Patient in great spirits and very recptive to education.      Pertinent Vitals/Pain Pain Assessment: No/denies pain    Home Living                      Prior Function            PT Goals (current goals can now be found in the care plan section) Acute Rehab PT Goals Patient Stated Goal: to go home PT Goal Formulation: With patient Time For Goal Achievement: 01/01/16 Potential to Achieve Goals: Good Progress towards PT goals: Progressing toward goals    Frequency  Min 3X/week    PT Plan Current plan remains appropriate    Co-evaluation             End of Session Equipment Utilized During Treatment: Gait belt Activity Tolerance: Patient limited by fatigue Patient left: in bed;with call bell/phone within reach (sitting EOB)     Time: 6433-2951 PT Time Calculation (min) (ACUTE ONLY): 20 min  Charges:  $Gait Training: 8-22 mins  G CodesFabio Asa 2016/01/06, 12:15 PM Charlotte Crumb, PT DPT  [phone]

## 2015-12-25 NOTE — Progress Notes (Signed)
CARDIAC REHAB PHASE I   PRE:  Rate/Rhythm: 67 SR PVCs  BP:  Supine:   Sitting: 105/73  Standing:    SaO2: 97%RA  MODE:  Ambulation: 250 ft   POST:  Rate/Rhythm: 90  BP:  Supine:   Sitting: 108/67  Standing:    SaO2: 91%RA 1500-1525 Pt walked 250 ft on RA with hand held asst with steady gait. Pt stated she has CHF booklet at home. Could answer teachback questions re weight, sodium and fluid restriction. Pt did not know if she had low sodium diets so gave them to her to look over. To recliner with call bell.   Christine Nutting, RN BSN  12/25/2015 3:21 PM

## 2015-12-25 NOTE — Progress Notes (Signed)
CRITICAL VALUE ALERT  Critical value received:  K+ 2.9  Date of notification:  12/25/15  Time of notification:  06:20  Nurse who received alert:  M. Nattaly Yebra  MD notified (1st page):  [place]  Time of first page:  06:21  Responding MD:  [place]  Time MD responded:  06:32  *New order for of Potassium chloride

## 2015-12-26 LAB — GLUCOSE, CAPILLARY
GLUCOSE-CAPILLARY: 208 mg/dL — AB (ref 65–99)
GLUCOSE-CAPILLARY: 253 mg/dL — AB (ref 65–99)
Glucose-Capillary: 181 mg/dL — ABNORMAL HIGH (ref 65–99)
Glucose-Capillary: 149 mg/dL — ABNORMAL HIGH (ref 65–99)
Glucose-Capillary: 169 mg/dL — ABNORMAL HIGH (ref 65–99)

## 2015-12-26 LAB — BASIC METABOLIC PANEL
Anion gap: 16 — ABNORMAL HIGH (ref 5–15)
BUN: 52 mg/dL — ABNORMAL HIGH (ref 6–20)
CHLORIDE: 90 mmol/L — AB (ref 101–111)
CO2: 29 mmol/L (ref 22–32)
CREATININE: 1.67 mg/dL — AB (ref 0.44–1.00)
Calcium: 9.9 mg/dL (ref 8.9–10.3)
GFR calc non Af Amer: 31 mL/min — ABNORMAL LOW (ref >60)
GFR, EST AFRICAN AMERICAN: 36 mL/min — AB (ref >60)
Glucose, Bld: 282 mg/dL — ABNORMAL HIGH (ref 65–99)
POTASSIUM: 4 mmol/L (ref 3.5–5.1)
SODIUM: 135 mmol/L (ref 135–145)

## 2015-12-26 NOTE — Progress Notes (Signed)
Patient ID: Lynze Reddy, female   DOB: 1951-07-05, 65 y.o.   MRN: [id number]     Advanced Heart Failure Rounding Note    Subjective:     Feels much better. Ambulating room without any difficulty. Diuretics remain on hold due to worsening renal failure. BMET pending for today.   Objective:   Weight Range: 237 lb 4.8 oz (107.639 kg) Body mass index is 37.16 kg/(m^2).   Vital Signs:   Temp:  [98.3 F (36.8 C)-98.7 F (37.1 C)] 98.3 F (36.8 C) (02/14 0806) Pulse Rate:  [71-79] 71 (02/14 0806) Resp:  [16-19] 18 (02/14 0806) BP: (100-113)/(62-76) 113/66 mmHg (02/14 0806) SpO2:  [97 %-100 %] 99 % (02/14 0806) Weight:  [237 lb 4.8 oz (107.639 kg)] 237 lb 4.8 oz (107.639 kg) (02/14 0612) Last BM Date: 12/24/15  Weight change: Filed Weights   12/24/15 0400 12/25/15 0351 12/26/15 0612  Weight: 239 lb 8 oz (108.636 kg) 237 lb 9.6 oz (107.775 kg) 237 lb 4.8 oz (107.639 kg)    Intake/Output:   Intake/Output Summary (Last 24 hours) at 12/26/15 1124 Last data filed at 12/26/15 0926  Gross per 24 hour  Intake    780 ml  Output   1300 ml  Net   -520 ml     Physical Exam:  General:  Sitting in chair NAD HEENT: normal Neck: JVP 6-7 cm. Carotids 2+ bilat; no bruits. No thyromegaly or nodule noted. Cor: PMI nondisplaced. Distant, RRR. No rubs, gallops or murmurs. Lungs: clear anteriorly Abdomen: Morbidly obese, soft, NT, ND, no HSM. No bruits or masses. +BS.  Extremities: no cyanosis, clubbing, rash. No edema.   Neuro: alert & orientedx3, cranial nerves grossly intact. moves all 4 extremities w/o difficulty. Affect pleasant   Telemetry: Reviewed personally, NSR 70s, Occasional PVCs. 7 beat NSVT  Labs: CBC  Recent Labs  12/24/15 0420 12/25/15 0533  WBC 6.0 5.6  HGB 14.1 13.5  HCT 43.0 40.3  MCV 89.0 89.0  PLT 415* 372   Basic Metabolic Panel  Recent Labs  12/24/15 0420 12/25/15 0533  NA 134* 134*  K 3.7 2.9*  CL 81* 86*  CO2 37* 33*  GLUCOSE 189* 178*    BUN 44* 51*  CREATININE 1.72* 1.90*  CALCIUM 9.8 9.8   Liver Function Tests No results for input(s): AST, ALT, ALKPHOS, BILITOT, PROT, ALBUMIN in the last 72 hours. No results for input(s): LIPASE, AMYLASE in the last 72 hours. Cardiac Enzymes No results for input(s): CKTOTAL, CKMB, CKMBINDEX, TROPONINI in the last 72 hours.  BNP: BNP (last 3 results)  Recent Labs  05/29/15 1608 06/04/15 0945 12/08/15 0922  BNP 921.7* 340.1* 1155.5*    ProBNP (last 3 results) No results for input(s): PROBNP in the last 8760 hours.   D-Dimer No results for input(s): DDIMER in the last 72 hours. Hemoglobin A1C No results for input(s): HGBA1C in the last 72 hours. Fasting Lipid Panel No results for input(s): CHOL, HDL, LDLCALC, TRIG, CHOLHDL, LDLDIRECT in the last 72 hours. Thyroid Function Tests No results for input(s): TSH, T4TOTAL, T3FREE, THYROIDAB in the last 72 hours.  Invalid input(s): FREET3   Medications:     Scheduled Medications: . aspirin  81 mg Oral Daily  . atorvastatin  10 mg Oral q1800  . insulin aspart  0-15 Units Subcutaneous TID WC  . insulin aspart  0-5 Units Subcutaneous QHS  . insulin glargine  10 Units Subcutaneous Daily  . multivitamin with minerals  1 tablet Oral Daily  .  potassium chloride  40 mEq Oral Daily  . sodium chloride flush  10-40 mL Intracatheter Q12H    Infusions: . sodium chloride Stopped (12/22/15 0945)    PRN Medications: sodium chloride, acetaminophen **OR** acetaminophen, alum & mag hydroxide-simeth, guaiFENesin, levalbuterol, ondansetron **OR** ondansetron (ZOFRAN) IV, oxyCODONE, polyethylene glycol, traMADol   Assessment   1. Acute on chronic CHF: Ischemic cardiomyopathy. NYHA Class IV.  Echo (1/17): EF 15-20% with moderate RV systolic dysfunction.  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  2. CAD s/p CABG x 4 2012: Angiography this admission with patent grafts, severe native vessel disease. 3. NSVT 4. Morbid Obesity 5. Acute  on chronic renal failure, stage 3 6. Hypokalemia 7. Hyponatremia 8. NSVT  Plan    Feels much better. Ambulating room without any difficulty. Diuretics remain on hold due to worsening renal failure. BMET pending for today.   Will await BMET. If renal function and hypokalemia better would restart HF meds and can d/c later today or tomorrow am with close HF f/u.   Home regimen to be   Torsemide 40 bid Carvedilol 3.125 bid (reduced dose) Spiro 12.5 mg daily Bidil 1/2 tab tid Asa 81 daily Atorva 10 daily  No ACE or ARB due to CKD  F/u in HF clinic next week.   Length of Stay: 18  Arvilla Meres MD 11:24 AM  12/26/2015

## 2015-12-26 NOTE — Progress Notes (Signed)
Pt had 7 beats of VTach, asymptomatic. Denies chest pain,nausea or vomiting, not in respiratory distress. Will continue to monitor pt

## 2015-12-26 NOTE — Progress Notes (Signed)
CARDIAC REHAB PHASE I   PRE:  Rate/Rhythm: 78 SR with PVCs    BP: sitting 116/76    SaO2: 98 RA  MODE:  Ambulation: 350 ft   POST:  Rate/Rhythm: 98 SR with PVCs    BP: sitting 120/80     SaO2: 97 RA  Pt tolerated well, no c/o. Sts "I'm not even SOB!" Return to recliner, VSS. [id number]  Elissa Lovett [place] CES, ACSM 12/26/2015 2:33 PM

## 2015-12-26 NOTE — Care Management Note (Addendum)
Case Management Note  Patient Details  Name: Christine Knight MRN: [id number] Date of Birth: [birth year]  Subjective/Objective:                    Action/Plan:  Pt is independent from home.  CM offered choice for HH, pt chose [facility], agency contacted and referral accepted.  Agency accepted HF order.   Pt is uninsured and stated she can not afford to private pay for PT. Pt refused DME rolling walker.  Pt has already been set up with [facility] - however missed her appt due to being admitted, CM attempted to reschedule appt however clinic was closed.  CM provided clinic information on AVS and requested bedside nurse stress importance of following up and rescheduling appt if discharged evening of 12/26/15.     Expected Discharge Date:   (UNKNOWN)               Expected Discharge Plan:  Home w Home Health Services  In-House Referral:     Discharge planning Services  CM Consult  Post Acute Care Choice:    Choice offered to:  Patient  DME Arranged:    DME Agency:     HH Arranged:  RN HH Agency:  [facility]  Status of Service:  In process, will continue to follow  Medicare Important Message Given:    Date Medicare IM Given:    Medicare IM give by:    Date Additional Medicare IM Given:    Additional Medicare Important Message give by:     If discussed at Long Length of Stay Meetings, dates discussed:    Additional Comments:  Cherylann Parr, RN 12/26/2015, 2:26 PM

## 2015-12-27 LAB — BASIC METABOLIC PANEL
Anion gap: 13 (ref 5–15)
BUN: 52 mg/dL — AB (ref 6–20)
CHLORIDE: 91 mmol/L — AB (ref 101–111)
CO2: 31 mmol/L (ref 22–32)
Calcium: 10.2 mg/dL (ref 8.9–10.3)
Creatinine, Ser: 1.5 mg/dL — ABNORMAL HIGH (ref 0.44–1.00)
GFR, EST AFRICAN AMERICAN: 41 mL/min — AB (ref >60)
GFR, EST NON AFRICAN AMERICAN: 36 mL/min — AB (ref >60)
GLUCOSE: 230 mg/dL — AB (ref 65–99)
POTASSIUM: 4.2 mmol/L (ref 3.5–5.1)
SODIUM: 135 mmol/L (ref 135–145)

## 2015-12-27 LAB — GLUCOSE, CAPILLARY
GLUCOSE-CAPILLARY: 228 mg/dL — AB (ref 65–99)
Glucose-Capillary: 157 mg/dL — ABNORMAL HIGH (ref 65–99)
Glucose-Capillary: 229 mg/dL — ABNORMAL HIGH (ref 65–99)
Glucose-Capillary: 177 mg/dL — ABNORMAL HIGH (ref 65–99)

## 2015-12-27 MED ORDER — SPIRONOLACTONE 25 MG PO TABS
12.5000 mg | ORAL_TABLET | Freq: Every day | ORAL | Status: DC
  Administered 2015-12-28: 12.5 mg via ORAL
  Filled 2015-12-27: qty 1

## 2015-12-27 MED ORDER — TORSEMIDE 20 MG PO TABS
40.0000 mg | ORAL_TABLET | Freq: Two times a day (BID) | ORAL | Status: DC
  Administered 2015-12-28: 40 mg via ORAL
  Filled 2015-12-27: qty 2

## 2015-12-27 MED ORDER — ISOSORB DINITRATE-HYDRALAZINE 20-37.5 MG PO TABS
0.5000 | ORAL_TABLET | Freq: Three times a day (TID) | ORAL | Status: DC
  Administered 2015-12-27 – 2015-12-28 (×3): 0.5 via ORAL
  Filled 2015-12-27 (×3): qty 1

## 2015-12-27 MED ORDER — CARVEDILOL 3.125 MG PO TABS
3.1250 mg | ORAL_TABLET | Freq: Two times a day (BID) | ORAL | Status: DC
  Administered 2015-12-28 (×2): 3.125 mg via ORAL
  Filled 2015-12-27 (×2): qty 1

## 2015-12-27 NOTE — Progress Notes (Signed)
Physical Therapy Treatment Patient Details Name: Archana Eckman MRN: [id number] DOB: 1951/09/13 Today's Date: 12/27/2015    History of Present Illness patient is a 65 yo female admitted 12/08/15 with ma, and a 40+ lb weight gain. 2 weeks of progressive SOB, LE edema, and a 40+ lb weight gain. Findings consistent with volume overload and pulmonary hypertension.    PT Comments    Patient seen for mobility progression and stair training. Patient ambulating well without devices, encouraged continued mobility. During stair training, patient had difficulty elevating up on to step, RLE buckling and required assist to elevate on to step. Definitive weakness noted. Will continue to see with stair negotiation goal as patient would like to be able to go up the stairs to her bedroom.  Follow Up Recommendations  Supervision for mobility/OOB     Equipment Recommendations  Rolling walker with 5" wheels    Recommendations for Other Services       Precautions / Restrictions Precautions Precautions: Fall Restrictions Weight Bearing Restrictions: No    Mobility  Bed Mobility               General bed mobility comments: received performing hygiene activities in standing  Transfers Overall transfer level: Independent                  Ambulation/Gait Ambulation/Gait assistance: Independent Ambulation Distance (Feet): 360 Feet Assistive device: None   Gait velocity: improved   General Gait Details: ambulating well, no difficulties, minimal DOE at end of session.   Stairs Stairs: Yes Stairs assistance: Min assist Stair Management: One rail Right;Step to pattern;Forwards Number of Stairs: 6 General stair comments: patient had difficulty elevating up on to step, RLE buckling and required assist to elevate on to step. Definitive weakness noted.   Wheelchair Mobility    Modified Rankin (Stroke Patients Only)       Balance     Sitting balance-[name] Scale: Good      Standing balance support: During functional activity Standing balance-[name] Scale: Fair                      Cognition Arousal/Alertness: Awake/alert Behavior During Therapy: WFL for tasks assessed/performed Overall Cognitive Status: Within Functional Limits for tasks assessed                      Exercises      General Comments        Pertinent Vitals/Pain Pain Assessment: No/denies pain    Home Living                      Prior Function            PT Goals (current goals can now be found in the care plan section) Acute Rehab PT Goals Patient Stated Goal: to go home PT Goal Formulation: With patient Time For Goal Achievement: 01/01/16 Potential to Achieve Goals: Good Progress towards PT goals: Progressing toward goals    Frequency  Min 3X/week    PT Plan Current plan remains appropriate    Co-evaluation             End of Session Equipment Utilized During Treatment: Gait belt Activity Tolerance: Patient limited by fatigue Patient left: in chair;with call bell/phone within reach     Time: 1502-1521 PT Time Calculation (min) (ACUTE ONLY): 19 min  Charges:  $Gait Training: 8-22 mins  G CodesFabio Asa 01-10-16, 3:43 PM Charlotte Crumb, PT DPT  [phone]

## 2015-12-27 NOTE — Progress Notes (Signed)
Inpatient Diabetes Program Recommendations  AACE/ADA: New Consensus Statement on Inpatient Glycemic Control (2015)  Target Ranges:  Prepandial:   less than 140 mg/dL      Peak postprandial:   less than 180 mg/dL (1-2 hours)      Critically ill patients:  140 - 180 mg/dL   Results for Christine Knight, Christine Knight (MRN [id number]) as of 12/27/2015 10:03  Ref. Range 12/26/2015 05:50 12/26/2015 11:46 12/26/2015 16:36 12/26/2015 21:02 12/27/2015 06:16  Glucose-Capillary Latest Ref Range: 65-99 mg/dL 621 (H) 308 (H) 657 (H) 169 (H) 157 (H)   Diabetes history: Type 2 diabetes Outpatient Diabetes medications: Janumet 50-1000 mg bid, Glipizide 10 mg bid Current orders for Inpatient glycemic control:  Lantus 10 units daily, Novolog moderate tid with meals and HS  Inpatient Diabetes Program Recommendations:  Insulin - Meal Coverage: Glucose increases to 200's around meal times. Please consider ordering Novolog 3 units TID with meals for meal coverage if patients eats at least 50% of meal (in addition to Novolog correction scale).  Thanks,  Christena Deem RN, MSN, [facility] Inpatient Diabetes Coordinator Team Pager [phone] (8a-5p)

## 2015-12-27 NOTE — Progress Notes (Signed)
CARDIAC REHAB PHASE I   PRE:  Rate/Rhythm: 70 SR c/ PVCs  BP:  Sitting: 117/81        SaO2: 96 RA  MODE:  Ambulation: 460 ft   POST:  Rate/Rhythm: 96 SR c/ PVCs  BP:  Sitting: 133/78         SaO2: 95 RA  Pt agreeable to walk. Pt ambulated 460 ft on RA, steady gait, handheld assist, tolerated well. Pt denies any complaints. Reviewed CHF education with pt, pt able to perform teach back. Reviewed exercise guidelines, phase 2 cardiac rehab, pt verbalized understanding. Pt interested in CRP2, currently has no insurance, gave pt financial assistance form. Will send referral to [facility]. Pt to recliner after walk, call bell within reach.   [id number] Joylene Grapes, RN, BSN 12/27/2015 10:09 AM

## 2015-12-27 NOTE — Progress Notes (Signed)
Patient ID: Christine Knight, female   DOB: 23-Jul-1951, 65 y.o.   MRN: [id number]     Advanced Heart Failure Rounding Note    Subjective:     Feels great. . Ambulating halls without any difficulty. BMET yesterday with improved renal function and potassium   Objective:   Weight Range: 107.321 kg (236 lb 9.6 oz) Body mass index is 37.05 kg/(m^2).   Vital Signs:   Temp:  [97.6 F (36.4 C)-98.4 F (36.9 C)] 98.1 F (36.7 C) (02/15 1225) Pulse Rate:  [72-79] 72 (02/15 1225) Resp:  [16-18] 18 (02/15 1225) BP: (110-116)/(51-71) 110/66 mmHg (02/15 1225) SpO2:  [98 %-99 %] 99 % (02/15 1225) Weight:  [107.321 kg (236 lb 9.6 oz)] 107.321 kg (236 lb 9.6 oz) (02/15 0536) Last BM Date: 12/24/15  Weight change: Filed Weights   12/25/15 0351 12/26/15 0612 12/27/15 0536  Weight: 107.775 kg (237 lb 9.6 oz) 107.639 kg (237 lb 4.8 oz) 107.321 kg (236 lb 9.6 oz)    Intake/Output:   Intake/Output Summary (Last 24 hours) at 12/27/15 1718 Last data filed at 12/27/15 1436  Gross per 24 hour  Intake    740 ml  Output   1075 ml  Net   -335 ml     Physical Exam:  General:  Sitting in chair NAD HEENT: normal Neck: JVP 7 cm. Carotids 2+ bilat; no bruits. No thyromegaly or nodule noted. Cor: PMI nondisplaced. Distant, RRR. No rubs, gallops or murmurs. Lungs: clear anteriorly Abdomen: Morbidly obese, soft, NT, ND, no HSM. No bruits or masses. +BS.  Extremities: no cyanosis, clubbing, rash. No edema.   Neuro: alert & orientedx3, cranial nerves grossly intact. moves all 4 extremities w/o difficulty. Affect pleasant   Telemetry: Reviewed personally, NSR 70s, Occasional PVCs. Labs: CBC  Recent Labs  12/25/15 0533  WBC 5.6  HGB 13.5  HCT 40.3  MCV 89.0  PLT 372   Basic Metabolic Panel  Recent Labs  12/25/15 0533 12/26/15 1445  NA 134* 135  K 2.9* 4.0  CL 86* 90*  CO2 33* 29  GLUCOSE 178* 282*  BUN 51* 52*  CREATININE 1.90* 1.67*  CALCIUM 9.8 9.9   Liver Function Tests No  results for input(s): AST, ALT, ALKPHOS, BILITOT, PROT, ALBUMIN in the last 72 hours. No results for input(s): LIPASE, AMYLASE in the last 72 hours. Cardiac Enzymes No results for input(s): CKTOTAL, CKMB, CKMBINDEX, TROPONINI in the last 72 hours.  BNP: BNP (last 3 results)  Recent Labs  05/29/15 1608 06/04/15 0945 12/08/15 0922  BNP 921.7* 340.1* 1155.5*    ProBNP (last 3 results) No results for input(s): PROBNP in the last 8760 hours.   D-Dimer No results for input(s): DDIMER in the last 72 hours. Hemoglobin A1C No results for input(s): HGBA1C in the last 72 hours. Fasting Lipid Panel No results for input(s): CHOL, HDL, LDLCALC, TRIG, CHOLHDL, LDLDIRECT in the last 72 hours. Thyroid Function Tests No results for input(s): TSH, T4TOTAL, T3FREE, THYROIDAB in the last 72 hours.  Invalid input(s): FREET3   Medications:     Scheduled Medications: . aspirin  81 mg Oral Daily  . atorvastatin  10 mg Oral q1800  . carvedilol  3.125 mg Oral BID WC  . insulin aspart  0-15 Units Subcutaneous TID WC  . insulin aspart  0-5 Units Subcutaneous QHS  . insulin glargine  10 Units Subcutaneous Daily  . isosorbide-hydrALAZINE  0.5 tablet Oral TID  . multivitamin with minerals  1 tablet Oral Daily  .  sodium chloride flush  10-40 mL Intracatheter Q12H  . [START ON 12/28/2015] spironolactone  12.5 mg Oral Daily  . torsemide  40 mg Oral BID    Infusions: . sodium chloride Stopped (12/22/15 0945)    PRN Medications: sodium chloride, acetaminophen **OR** acetaminophen, alum & mag hydroxide-simeth, guaiFENesin, levalbuterol, ondansetron **OR** ondansetron (ZOFRAN) IV, oxyCODONE, polyethylene glycol, traMADol   Assessment   1. Acute on chronic CHF: Ischemic cardiomyopathy. NYHA Class IV.  Echo (1/17): EF 15-20% with moderate RV systolic dysfunction.  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  2. CAD s/p CABG x 4 2012: Angiography this admission with patent grafts, severe native vessel  disease. 3. NSVT 4. Morbid Obesity 5. Acute on chronic renal failure, stage 3 6. Hypokalemia 7. Hyponatremia 8. NSVT  Plan    Feels much better. Ambulating room without any difficulty. BMET improved.   Ideally was to go home today but patient listed on [facility] and has not been seen by them in several days. She is now unable to go home tonight because she has no one at home to watch her. Will restart home meds as below and send home in am. Check BMET in am.   Torsemide 40 bid Carvedilol 3.125 bid (reduced dose) Spiro 12.5 mg daily Bidil 1/2 tab tid Asa 81 daily Atorva 10 daily  No ACE or ARB due to CKD  F/u in HF clinic next week.   Length of Stay: 19  Arvilla Meres MD 5:18 PM  12/27/2015

## 2015-12-28 LAB — BASIC METABOLIC PANEL
ANION GAP: 12 (ref 5–15)
BUN: 51 mg/dL — AB (ref 6–20)
CO2: 28 mmol/L (ref 22–32)
Calcium: 9.6 mg/dL (ref 8.9–10.3)
Chloride: 94 mmol/L — ABNORMAL LOW (ref 101–111)
Creatinine, Ser: 1.4 mg/dL — ABNORMAL HIGH (ref 0.44–1.00)
GFR calc Af Amer: 45 mL/min — ABNORMAL LOW (ref >60)
GFR calc non Af Amer: 39 mL/min — ABNORMAL LOW (ref >60)
GLUCOSE: 148 mg/dL — AB (ref 65–99)
POTASSIUM: 3.4 mmol/L — AB (ref 3.5–5.1)
Sodium: 134 mmol/L — ABNORMAL LOW (ref 135–145)

## 2015-12-28 LAB — GLUCOSE, CAPILLARY
Glucose-Capillary: 151 mg/dL — ABNORMAL HIGH (ref 65–99)
Glucose-Capillary: 170 mg/dL — ABNORMAL HIGH (ref 65–99)

## 2015-12-28 MED ORDER — POTASSIUM CHLORIDE CRYS ER 20 MEQ PO TBCR
40.0000 meq | EXTENDED_RELEASE_TABLET | Freq: Two times a day (BID) | ORAL | Status: AC
Start: 2015-12-28 — End: 2015-12-28
  Administered 2015-12-28 (×2): 40 meq via ORAL
  Filled 2015-12-28 (×2): qty 2

## 2015-12-28 MED ORDER — ATORVASTATIN CALCIUM 10 MG PO TABS: 10.0000 mg | ORAL_TABLET | Freq: Every day | ORAL | Status: DC

## 2015-12-28 MED ORDER — CARVEDILOL 3.125 MG PO TABS: 3.1250 mg | ORAL_TABLET | Freq: Two times a day (BID) | ORAL | Status: DC

## 2015-12-28 MED ORDER — TORSEMIDE 20 MG PO TABS: 40.0000 mg | ORAL_TABLET | Freq: Two times a day (BID) | ORAL | Status: DC

## 2015-12-28 MED ORDER — CLOPIDOGREL BISULFATE 75 MG PO TABS: 75.0000 mg | ORAL_TABLET | Freq: Every day | ORAL | Status: DC

## 2015-12-28 MED ORDER — ISOSORB DINITRATE-HYDRALAZINE 20-37.5 MG PO TABS: 0.5000 | ORAL_TABLET | Freq: Three times a day (TID) | ORAL | Status: DC

## 2015-12-28 MED ORDER — POTASSIUM CHLORIDE CRYS ER 20 MEQ PO TBCR: 20.0000 meq | EXTENDED_RELEASE_TABLET | Freq: Every day | ORAL | Status: DC

## 2015-12-28 MED ORDER — SPIRONOLACTONE 25 MG PO TABS
12.5000 mg | ORAL_TABLET | Freq: Every day | ORAL | Status: DC
Start: 2015-12-28 — End: 2016-04-26

## 2015-12-28 NOTE — Progress Notes (Signed)
DC instructions given to patient and verbalized understanding 

## 2015-12-28 NOTE — Progress Notes (Signed)
Physical Therapy Treatment Patient Details Name: Christine Knight MRN: [id number] DOB: Oct 17, 1951 Today's Date: 01/24/2016    History of Present Illness patient is a 65 yo female admitted 12/08/15 with ma, and a 40+ lb weight gain. 2 weeks of progressive SOB, LE edema, and a 40+ lb weight gain. Findings consistent with volume overload and pulmonary hypertension.    PT Comments    Patient seen for stair negotiation for discharge home. Performed 3 trials with education, tolerated well. Ambulated to and from without difficulty. No further acute PT needs. Goals met.  Follow Up Recommendations  Supervision for mobility/OOB     Equipment Recommendations  Rolling walker with 5" wheels    Recommendations for Other Services       Precautions / Restrictions Precautions Precautions: Fall Restrictions Weight Bearing Restrictions: No    Mobility  Bed Mobility               General bed mobility comments: received performing hygiene activities in standing  Transfers Overall transfer level: Independent                  Ambulation/Gait Ambulation/Gait assistance: Independent Ambulation Distance (Feet): 360 Feet Assistive device: None   Gait velocity: improved   General Gait Details: ambulating well, no difficulties, minimal DOE at end of session.   Stairs Stairs: Yes Stairs assistance: Supervision Stair Management: One rail Right;Step to pattern;Forwards Number of Stairs: 6 (x3 trials) General stair comments: reinforced step to technique and sequencing, performed x3  Wheelchair Mobility    Modified Rankin (Stroke Patients Only)       Balance     Sitting balance-[name] Scale: Good       Standing balance-[name] Scale: Fair                      Cognition Arousal/Alertness: Awake/alert Behavior During Therapy: WFL for tasks assessed/performed Overall Cognitive Status: Within Functional Limits for tasks assessed                       Exercises      General Comments        Pertinent Vitals/Pain Pain Assessment: No/denies pain    Home Living                      Prior Function            PT Goals (current goals can now be found in the care plan section) Acute Rehab PT Goals Patient Stated Goal: to go home PT Goal Formulation: With patient Time For Goal Achievement: 01/01/16 Potential to Achieve Goals: Good Progress towards PT goals: Goals met/education completed, patient discharged from PT    Frequency  Min 3X/week    PT Plan Current plan remains appropriate    Co-evaluation             End of Session Equipment Utilized During Treatment: Gait belt Activity Tolerance: Patient limited by fatigue Patient left: in chair;with call bell/phone within reach     Time: 1039-1100 PT Time Calculation (min) (ACUTE ONLY): 21 min  Charges:  $Gait Training: 8-22 mins                    G CodesDuncan Knight 01-24-2016, 12:20 PM Christine Knight, [place] DPT  [phone]

## 2015-12-28 NOTE — Progress Notes (Signed)
Patient ID: Christine Knight, female   DOB: 03-Aug-1951, 65 y.o.   MRN: [id number]     Advanced Heart Failure Rounding Note    Subjective:    Overall feeling very good.  Feels like a new woman.  Ambulating without problem. Did have some difficulty with steps yesterday. Daughter gets off around 2 today and says she wants to go anytime after that.   Creatinine improved. 1.67 -> 1.50 -> 1.40. K down slightly 3.4.  Objective:   Weight Range: 237 lb 8 oz (107.729 kg) Body mass index is 37.19 kg/(m^2).   Vital Signs:   Temp:  [98.1 F (36.7 C)-98.6 F (37 C)] 98.6 F (37 C) (02/16 0451) Pulse Rate:  [72-78] 78 (02/16 0451) Resp:  [18] 18 (02/16 0451) BP: (110-121)/(66-89) 115/66 mmHg (02/16 0451) SpO2:  [98 %-100 %] 98 % (02/16 0451) Weight:  [237 lb 8 oz (107.729 kg)] 237 lb 8 oz (107.729 kg) (02/16 0451) Last BM Date: 12/27/15  Weight change: Filed Weights   12/26/15 0612 12/27/15 0536 12/28/15 0451  Weight: 237 lb 4.8 oz (107.639 kg) 236 lb 9.6 oz (107.321 kg) 237 lb 8 oz (107.729 kg)    Intake/Output:   Intake/Output Summary (Last 24 hours) at 12/28/15 0826 Last data filed at 12/28/15 0640  Gross per 24 hour  Intake   1080 ml  Output    650 ml  Net    430 ml     Physical Exam:  General:  Sitting in chair NAD HEENT: normal Neck: JVP 6-7 cm. Carotids 2+ bilat; no bruits. No thyromegaly or lymphadenopathy noted. Cor: PMI nondisplaced. Distant, RRR. No M/G/R Lungs: clear anteriorly Abdomen: Morbidly obese, soft, non-tender, non-distended, no HSM. No bruits or masses. +BS.  Extremities: no cyanosis, clubbing, rash. No edema.   Neuro: alert & orientedx3, cranial nerves grossly intact. moves all 4 extremities w/o difficulty. Affect pleasant   Telemetry: Reviewed personally, NSR 70s Labs: CBC No results for input(s): WBC, NEUTROABS, HGB, HCT, MCV, PLT in the last 72 hours. Basic Metabolic Panel  Recent Labs  12/27/15 1855 12/28/15 0552  NA 135 134*  K 4.2 3.4*    CL 91* 94*  CO2 31 28  GLUCOSE 230* 148*  BUN 52* 51*  CREATININE 1.50* 1.40*  CALCIUM 10.2 9.6   Liver Function Tests No results for input(s): AST, ALT, ALKPHOS, BILITOT, PROT, ALBUMIN in the last 72 hours. No results for input(s): LIPASE, AMYLASE in the last 72 hours. Cardiac Enzymes No results for input(s): CKTOTAL, CKMB, CKMBINDEX, TROPONINI in the last 72 hours.  BNP: BNP (last 3 results)  Recent Labs  05/29/15 1608 06/04/15 0945 12/08/15 0922  BNP 921.7* 340.1* 1155.5*    ProBNP (last 3 results) No results for input(s): PROBNP in the last 8760 hours.   D-Dimer No results for input(s): DDIMER in the last 72 hours. Hemoglobin A1C No results for input(s): HGBA1C in the last 72 hours. Fasting Lipid Panel No results for input(s): CHOL, HDL, LDLCALC, TRIG, CHOLHDL, LDLDIRECT in the last 72 hours. Thyroid Function Tests No results for input(s): TSH, T4TOTAL, T3FREE, THYROIDAB in the last 72 hours.  Invalid input(s): FREET3   Medications:     Scheduled Medications: . aspirin  81 mg Oral Daily  . atorvastatin  10 mg Oral q1800  . carvedilol  3.125 mg Oral BID WC  . insulin aspart  0-15 Units Subcutaneous TID WC  . insulin aspart  0-5 Units Subcutaneous QHS  . insulin glargine  10 Units Subcutaneous Daily  .  isosorbide-hydrALAZINE  0.5 tablet Oral TID  . multivitamin with minerals  1 tablet Oral Daily  . potassium chloride  40 mEq Oral BID  . sodium chloride flush  10-40 mL Intracatheter Q12H  . spironolactone  12.5 mg Oral Daily  . torsemide  40 mg Oral BID    Infusions: . sodium chloride Stopped (12/22/15 0945)    PRN Medications: sodium chloride, acetaminophen **OR** acetaminophen, alum & mag hydroxide-simeth, guaiFENesin, levalbuterol, ondansetron **OR** ondansetron (ZOFRAN) IV, oxyCODONE, polyethylene glycol, traMADol   Assessment   1. Acute on chronic CHF: Ischemic cardiomyopathy. NYHA Class IV.  Echo (1/17): EF 15-20% with moderate RV systolic  dysfunction.  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  2. CAD s/p CABG x 4 2012: Angiography this admission with patent grafts, severe native vessel disease. 3. NSVT 4. Morbid Obesity 5. Acute on chronic renal failure, stage 3 6. Hypokalemia 7. Hyponatremia 8. NSVT  Plan    She looks great. Overall down 70 lbs and > 40 L liters of fluid.   She is stable for d/c home today with close HF follow up next week with labwork.   Meds for home Torsemide 40 bid Carvedilol 3.125 bid (reduced dose) Spiro 12.5 mg daily Bidil 1/2 tab tid Asa 81 daily Atorva 10 daily  Will also give 20 meq of K daily, and recheck potassium next week.   No ACE or ARB due to CKD  F/u in HF clinic next week.   Length of Stay: [address]  [name] PA-C 8:26 AM  12/28/2015  Advanced Heart Failure Team Pager [phone] (M-F; 7a - 4p)  Please contact [facility] for night-coverage after hours (4p -7a ) and weekends on [email or web address]   Patient seen and examined with Otilio Saber, PA-C. We discussed all aspects of the encounter. I agree with the assessment and plan as stated above.   Ok for d/c today on above regimen. Supp K prior to d/c. Will see in clinic next week. With improvement in renal function may be able to reconsider ACE or ARNI at f/u.   Christine Schumm,MD 9:47 AM

## 2015-12-28 NOTE — Progress Notes (Signed)
Coupon card given to patient for Bidil. Patient lives at her sister's home since Sept 2016 due to the loss of her home/ she is independent of ADL's; Was employed at [employer] but is out of work due to medical leave, insurance cancelled in September. PCP is Dr Yetta Barre, Cardiologist is Dr Myrtis Ser but patient stated that she can no longer afford to see them and has not seen them in months. She is agreeable to go to the [facility] and [facility] - apt to be made prior to discharge home. Pharmacy of choice is [facility]. HHRN arranged with [facility]. Patient is using her retirement funds to pay for meds/ and shelter at her sister's home. Financial Counselor has seen the patient and she has applied for Medicaid / Disability; Alexis Goodell [phone]

## 2015-12-28 NOTE — Discharge Summary (Signed)
Advanced Heart Failure Discharge Note   Discharge Summary   Patient ID: Christine Knight MRN: [id number], DOB/AGE: [birth year] 65 y.o. Admit date: 12/08/2015 D/C date:     12/28/2015   Primary Discharge Diagnoses:  1. Acute on chronic systolic CHF: Ischemic cardiomyopathy. NYHA Class IV. Echo (1/17): EF 15-20% with moderate RV systolic dysfunction.  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  2. CAD s/p CABG x 4 2012: Angiography this admission with patent grafts, severe native vessel disease. 3. NSVT - quiescent.  4. Morbid Obesity - Body mass index is 37.19 kg/(m^2). 5. Acute on chronic renal failure, stage 3 - resolved 6. Hypokalemia - resolved 7. Hyponatremia - stable  Consults: Diabetes coordinator.   Hospital Course:   Christine Knight is a 65 y.o. female admitted from [facility] 12/08/15 with worsening dyspnea on exertion for several weeks and cough productive of white sputum. No improvement despite increasing her torsemide and she c/o 40 lb weight gain since September. With difficult diuresis initially she was transferred to [facility] for [facility] with full results as below that showed marked volume overload and moderate Pulmonary HTN. She failed to diurese on a standard regimen so the HF team was called. She was started on aggressive diuresis with lasix gtt at 30 mg/hr. PICC placed for CVP monitoring. 2.5 mg metolazone BID also given    CVP initially remained elevated with slow diuresis. Metolazone increased to 5 mg BID. Pt also placed on milrinone for diuretic augmentation. Diamox also added 12/16/15 with HCO3 of 38.  Meds titrated as able with BP on soft side. Creatinine remained relatively stable despite very high dose diuretics. Bidil and BB eventually held with soft BP.   Cardiac rehab initially limited by leg pain and swelling. UNNA boots placed and pt functional status gradually improved with diuresis. She slow increased her ability to walk, eventually working up to doing steps, and walking on  flat ground without DOE.   DM2 management adjusted with assistance from Diabetes coordinator.   IV lasix, milrinone, and acetazolmide stopped 2/10. Torsemide was initially cut back on 12/24/15 with worsening creatinine. Creatinine peaked at 1.90 and trended back down with medication hold.  Meds added back 12/27/15. Pt was thought to be stable for discharge after resumption of meds.  Overall she diuresed > 40 L and down 70 lbs from admission.  She will be sent home in stable condition with close follow up in the HF clinic as below.   Echo 12/09/15 LVEF 15-20%, Mild MR, RV mild dilated, mod reduced, RA mod dilated, Severe TR, PA peak pressure 57 mm Hg.   [facility] 12/13/15 Hemodynamics RA 25/25 (23) RV pressure 92/13  RV EDP 26 PA pressure 86/34 (56) PW mean 37 AO Pressure 114/72 (88) LV Pressure 101/12 (26) LV EDP 26 PVR 3.24 Fick CO/CI 5.85 / 2.41L/min   Prox LAD lesion, 100% stenosed. Prox Cx lesion, 99% stenosed. Mid RCA lesion, 80% stenosed. Dist RCA lesion, 100% stenosed. Patent grafts  Discharge Weight Range:  237 lbs (Weight on admit 309 lbs) Discharge Vitals: Blood pressure 115/66, pulse 78, temperature 98.6 F (37 C), temperature source Oral, resp. rate 18, height '5\' 7"'$  (1.702 m), weight 237 lb 8 oz (107.729 kg), SpO2 98 %.  Labs: Lab Results  Component Value Date   WBC 5.6 12/25/2015   HGB 13.5 12/25/2015   HCT 40.3 12/25/2015   MCV 89.0 12/25/2015   PLT 372 12/25/2015     Recent Labs Lab 12/28/15 0552  NA 134*  K 3.4*  CL 94*  CO2 28  BUN 51*  CREATININE 1.40*  CALCIUM 9.6  GLUCOSE 148*   Lab Results  Component Value Date   CHOL 136 12/09/2015   HDL 26* 12/09/2015   LDLCALC 94 12/09/2015   TRIG 82 12/09/2015   BNP (last 3 results)  Recent Labs  05/29/15 1608 06/04/15 0945 12/08/15 0922  BNP 921.7* 340.1* 1155.5*    ProBNP (last 3 results) No results for input(s): PROBNP in the last 8760 hours.   Diagnostic Studies/Procedures   No  results found.  Discharge Medications     Medication List    STOP taking these medications        furosemide 80 MG tablet  Commonly known as:  LASIX      TAKE these medications        acetaminophen 325 MG tablet  Commonly known as:  TYLENOL  Take 650 mg by mouth every 6 (six) hours as needed for mild pain.     aspirin EC 81 MG tablet  Take 1 tablet (81 mg total) by mouth daily.     atorvastatin 10 MG tablet  Commonly known as:  LIPITOR  Take 1 tablet (10 mg total) by mouth daily.     carvedilol 3.125 MG tablet  Commonly known as:  COREG  Take 1 tablet (3.125 mg total) by mouth 2 (two) times daily with a meal.     clopidogrel 75 MG tablet  Commonly known as:  PLAVIX  Take 1 tablet (75 mg total) by mouth daily.     glipiZIDE 10 MG tablet  Commonly known as:  GLUCOTROL  Take 10 mg by mouth 2 (two) times daily.     glucose blood test strip  Use TID     isosorbide-hydrALAZINE 20-37.5 MG tablet  Commonly known as:  BIDIL  Take 0.5 tablets by mouth 3 (three) times daily.     multivitamin with minerals Tabs tablet  Take 1 tablet by mouth daily.     ONETOUCH VERIO w/Device Kit  1 Act by Does not apply route 3 (three) times daily.     potassium chloride SA 20 MEQ tablet  Commonly known as:  K-DUR,KLOR-CON  Take 1 tablet (20 mEq total) by mouth daily.     sitaGLIPtin-metformin 50-1000 MG tablet  Commonly known as:  JANUMET  Take 1 tablet by mouth 2 (two) times daily with a meal.     spironolactone 25 MG tablet  Commonly known as:  ALDACTONE  Take 0.5 tablets (12.5 mg total) by mouth daily.     torsemide 20 MG tablet  Commonly known as:  DEMADEX  Take 2 tablets (40 mg total) by mouth 2 (two) times daily.        Disposition   The patient will be discharged in stable condition to home. Discharge Instructions    Amb Referral to Cardiac Rehabilitation    Complete by:  As directed   Diagnosis:  Heart Failure (see criteria below)  Heart Failure Type:  Chronic  Systolic     Diet - low sodium heart healthy    Complete by:  As directed      Heart Failure patients record your daily weight using the same scale at the same time of day    Complete by:  As directed      Increase activity slowly    Complete by:  As directed           Follow-up Information    Follow up with [facility]. Go on 01/04/2016.   Why:  Please call to reschedule appointment, explain that this is a discharge follow up'@3'$ :00pm please arrive  '@2'$ :45pm   Contact information:   [address] [place] [address] [phone]      Follow up with [facility].   Why:  registered nurse   Contact information:   [address] [place] [address] [phone]       Follow up with [facility] On 01/04/2016.   Specialty:  Cardiology   Why:  at 1000 for post hospital follow up. Please bring all of your medications to your visit.  The code for patient parking is 0001.   Contact information:   [address] [id number] [place] [phone]        Duration of Discharge Encounter: Greater than 35 minutes   Signed, Annamaria Helling 12/28/2015, 12:07 PM  Patient seen and examined with Oda Kilts, PA-C. We discussed all aspects of the encounter. I agree with the assessment and plan as stated above.   Rani Sisney,MD 6:05 PM

## 2015-12-28 NOTE — Progress Notes (Signed)
RUA PICC d/c'ed per order. Pt verbalizes understanding of signs of infection and bleeding and interventions and when to call doctor via teachback method.  Drsg change in 24-48 hours.  Site cleaned with CHG, covered with vaseline guaze and dry 2x2.  No signs of infection present no ADN.

## 2016-01-02 ENCOUNTER — Encounter (HOSPITAL_COMMUNITY): Payer: Self-pay | Admitting: *Deleted

## 2016-01-02 NOTE — Progress Notes (Signed)
Referral for cardiac rehab faxed 

## 2016-01-03 NOTE — Progress Notes (Signed)
Patient ID: Christine Knight, female   DOB: Nov 27, 1950, 65 y.o.   MRN: [id number]    Advanced Heart Failure Clinic Note   PCP: None HF: Dr. Haroldine Laws   HPI: Christine Knight is a 65 y.o. female w/ PMHx significant for morbid obesity, systolic HF due to ICM (72/53 EF 25%; 07/2012 EF 35%; 03/18/13 EF 20-25%), CAD with h/o NSTEMI s/p CABG Oct 2012, pulmonary hypertension (PA peak 90mHg), and poorly controlled DM 2.  [facility], leg edema, and weight gain.  Lasix gtt used and she diuresed 15 liters. Diuresed 29 pounds.  ECHO- EF 20-25%.  Discharge weight 264 pounds. Christine Knight d/t dizziness and hyperkalemia   Admitted 12/08/2015 - 12/28/2015 with worsening DOE and cough. Stated she had a 40 lb weight since 07/2015. She was diuresed aggressively with lasix gtt at 30 mg/hr and metolazone 5 mg BID. Diamox eventually added. Diuretics held temporarily towards end of stay with worsening Creatinine. Overall she diuresed >40 L and was down 70 lbs from admission weight. Pt had R/LHC with preserved CO and mild/mod Pulm HTN. Severe native CAD with patent CABG grafts. Discharge weight 237 lbs.   She presents today for post hospital follow up. Prior to recent admission had not been seen in HF clinic since August 2014. Overall feels great. Weight stable, states she has lost several more lbs at home.  234 at home this morning. (239 when she got home from admit) Going to the gym using Treadmill for 2 10 minute intervals (speed 2, about 1/2 mile). Has done it every day since she left the hospital. Denies SOB or DOE. No CP. Swelling has not come back either. Being very careful with sodium and fluid intake. Asks about going back to work, sits and types on a computer.   03/18/13 ECHO EF 20-25% 06/17/2013 ECHO EF 35% RV ok (read formally after visit 35-40%) 03/15/2015 ECHO EF 30-35%, grade 3 DD, Moderate TR, Severely reduced RV, PA peak pressure 81 mm Hg 12/09/2015 ECHO EF 15-20%, Decreased LV diastolic  compliance, RV mod reduced, PA peak 57 mmHg, Severe TR   [facility] RA 25/25 (23) RV pressure 92/13  RV EDP 26 PA pressure 86/34 (56) PW mean 37 AO Pressure 114/72 (88) LV Pressure 101/12 (26) LV EDP 26 PVR 3.24 Fick CO/CI 5.85 / 2.41L/min   Prox LAD lesion, 100% stenosed. Prox Cx lesion, 99% stenosed. Mid RCA lesion, 80% stenosed. Dist RCA lesion, 100% stenosed. Patent grafts  ROS: All systems negative except as listed in HPI, PMH and Problem List.  Past Medical History  Diagnosis Date  . Obesity, morbid ([facility]   . Chronic systolic heart failure (HCC)     Chronic systolic CHF  . Hyperlipidemia 08/10/11  . Diabetes mellitus type 2 in obese ([facility] [place] 08/10/11  . CAD (coronary artery disease) 08/2011    NSTEMI with subsequent CABG 08/13/2011 with LIMA-LAD, SVG-diagonal, SVG-OM, SVG-PDA  . Ischemic cardiomyopathy 08/13/2011    EF 20% in 08/2011, still 25% 10/21/2011  . Pleural effusion     Requiring L thoracentesis 09/02/11  . Mitral regurgitation     Mild by echo 10/21/11  . Hypertension   . Pulmonary hypertension ([facility]     Echo, September, 2013, 72 mmHg.  .Marland KitchenEjection fraction < 50%     EF 20%, October, 2012  //  EF 25% December, 2012  //   EF 35%, echo, September, 2013  . Myocardial infarction ([facility] 08/2011  . Orthopnoea     "  progressively worse over last 3 wks" (03/17/2013)  . CHF (congestive heart failure) (HCC)   . Carpal tunnel syndrome on both sides     "post OHS in 08/2011; resolved now" (03/17/2013)    Current Outpatient Prescriptions  Medication Sig Dispense Refill  . acetaminophen (TYLENOL) 325 MG tablet Take 650 mg by mouth every 6 (six) hours as needed for mild pain.    Marland Kitchen aspirin 81 MG tablet Take 1 tablet (81 mg total) by mouth daily. 30 tablet 9  . atorvastatin (LIPITOR) 10 MG tablet Take 1 tablet (10 mg total) by mouth daily. 30 tablet 6  . Blood Glucose Monitoring Suppl (ONETOUCH VERIO) W/DEVICE KIT 1 Act by Does not apply route 3 (three) times  daily. 2 kit 0  . carvedilol (COREG) 3.125 MG tablet Take 1 tablet (3.125 mg total) by mouth 2 (two) times daily with a meal. 60 tablet 6  . glipiZIDE (GLUCOTROL) 10 MG tablet Take 10 mg by mouth 2 (two) times daily.  1  . glucose blood test strip Use TID 100 each 12  . isosorbide-hydrALAZINE (BIDIL) 20-37.5 MG tablet Take 0.5 tablets by mouth 3 (three) times daily. 15 tablet 6  . Multiple Vitamin (MULTIVITAMIN WITH MINERALS) TABS tablet Take 1 tablet by mouth daily.    . potassium chloride SA (K-DUR,KLOR-CON) 20 MEQ tablet Take 1 tablet (20 mEq total) by mouth daily. 30 tablet 6  . spironolactone (ALDACTONE) 25 MG tablet Take 0.5 tablets (12.5 mg total) by mouth daily. 15 tablet 6  . torsemide (DEMADEX) 20 MG tablet Take 2 tablets (40 mg total) by mouth 2 (two) times daily. 120 tablet 6  . sitaGLIPtin-metformin (JANUMET) 50-1000 MG per tablet Take 1 tablet by mouth 2 (two) times daily with a meal. (Patient not taking: Reported on 01/04/2016) 180 tablet 1  . [DISCONTINUED] simvastatin (ZOCOR) 20 MG tablet Take 1 tablet (20 mg total) by mouth every evening. 90 tablet 1   No current facility-administered medications for this encounter.     PHYSICAL EXAM: Filed Vitals:   01/04/16 1041  BP: 132/84  Pulse: 88  Weight: 238 lb (107.956 kg)  SpO2: 93%   General:Looks great, NAD HEENT: normal Neck: JVP 6-7 cm. Carotids 2+ bilat; no bruits. No thyromegaly or nodule noted. Cor: PMI nondisplaced. Distant, RRR. No M/G/R Lungs: CTAB, normal effort Abdomen: Morbidly obese, soft, NT, ND, no HSM. No bruits or masses. +BS  Extremities: no cyanosis, clubbing, rash. No edema.  Neuro: alert & orientedx3, cranial nerves grossly intact. moves all 4 extremities w/o difficulty. Affect pleasant  ASSESSMENT/PLAN  1. 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 - Volume status looks stable.  Continue 40 mg torsemide BID with 20 meq K daily  Check BMET/BNP today. - Continue coreg 3.125 mg BID.  Anticipate increasing at next visit. - Increase bidil to 1 tab TID.  - Continue spironolactone 12.5 mg daily - Will see back in 2 weeks to try and start ARB/ARNI (cough with ACEi) - Plan on rechecking Echo 2-3 months with med optimization. 2. CAD s/p NSTEMI and CABG 10/12  - Severe native CAD with patent grafts on LHC 12/13/15 - Continue ASA 81 mg BID and atorvastatin 10 mg daily.  3. Morbid obesity - Encouraged to watch portions and increase activity as able.  4. Pulmonary HTN - mild to mod RHC 12/13/15. PVR 3.24.  5. CKD stage III - BMET today 6. DM2   Med changes as above.  BMET/BNP today.  Will see  back in 2 week for med titration.  Think it is reasonable for her to remain out of work until then.   Shirley Friar, PA-C 11:09 AM

## 2016-01-04 ENCOUNTER — Ambulatory Visit: Payer: Medicaid Other | Attending: Family Medicine | Admitting: Family Medicine

## 2016-01-04 ENCOUNTER — Other Ambulatory Visit: Payer: Self-pay

## 2016-01-04 ENCOUNTER — Telehealth: Payer: Self-pay

## 2016-01-04 ENCOUNTER — Encounter (HOSPITAL_COMMUNITY): Payer: Self-pay

## 2016-01-04 ENCOUNTER — Ambulatory Visit (HOSPITAL_BASED_OUTPATIENT_CLINIC_OR_DEPARTMENT_OTHER)
Admit: 2016-01-04 | Discharge: 2016-01-04 | Disposition: A | Discharge status: Home / Self Care | Payer: Medicaid Other | Source: Ambulatory Visit | Attending: Internal Medicine | Admitting: Internal Medicine

## 2016-01-04 VITALS — BP 132/84 | HR 88 | Wt 238.0 lb

## 2016-01-04 VITALS — BP 101/63 | HR 75 | Temp 98.2°F | Resp 16 | Ht 67.0 in | Wt 239.0 lb

## 2016-01-04 DIAGNOSIS — I2510 Atherosclerotic heart disease of native coronary artery without angina pectoris: Secondary | ICD-10-CM

## 2016-01-04 DIAGNOSIS — E1122 Type 2 diabetes mellitus with diabetic chronic kidney disease: Secondary | ICD-10-CM | POA: Insufficient documentation

## 2016-01-04 DIAGNOSIS — E1165 Type 2 diabetes mellitus with hyperglycemia: Secondary | ICD-10-CM | POA: Diagnosis not present

## 2016-01-04 DIAGNOSIS — I5030 Unspecified diastolic (congestive) heart failure: Secondary | ICD-10-CM | POA: Insufficient documentation

## 2016-01-04 DIAGNOSIS — I130 Hypertensive heart and chronic kidney disease with heart failure and stage 1 through stage 4 chronic kidney disease, or unspecified chronic kidney disease: Secondary | ICD-10-CM | POA: Insufficient documentation

## 2016-01-04 DIAGNOSIS — Z7984 Long term (current) use of oral hypoglycemic drugs: Secondary | ICD-10-CM

## 2016-01-04 DIAGNOSIS — I252 Old myocardial infarction: Secondary | ICD-10-CM | POA: Insufficient documentation

## 2016-01-04 DIAGNOSIS — Z951 Presence of aortocoronary bypass graft: Secondary | ICD-10-CM

## 2016-01-04 DIAGNOSIS — N183 Chronic kidney disease, stage 3 (moderate): Secondary | ICD-10-CM | POA: Insufficient documentation

## 2016-01-04 DIAGNOSIS — I272 Other secondary pulmonary hypertension: Secondary | ICD-10-CM | POA: Insufficient documentation

## 2016-01-04 DIAGNOSIS — E1169 Type 2 diabetes mellitus with other specified complication: Secondary | ICD-10-CM

## 2016-01-04 DIAGNOSIS — Z79899 Other long term (current) drug therapy: Secondary | ICD-10-CM | POA: Insufficient documentation

## 2016-01-04 DIAGNOSIS — I5022 Chronic systolic (congestive) heart failure: Secondary | ICD-10-CM

## 2016-01-04 DIAGNOSIS — I5043 Acute on chronic combined systolic (congestive) and diastolic (congestive) heart failure: Secondary | ICD-10-CM

## 2016-01-04 DIAGNOSIS — E785 Hyperlipidemia, unspecified: Secondary | ICD-10-CM

## 2016-01-04 DIAGNOSIS — Z87891 Personal history of nicotine dependence: Secondary | ICD-10-CM | POA: Insufficient documentation

## 2016-01-04 DIAGNOSIS — I255 Ischemic cardiomyopathy: Secondary | ICD-10-CM | POA: Insufficient documentation

## 2016-01-04 DIAGNOSIS — IMO0002 Reserved for concepts with insufficient information to code with codable children: Secondary | ICD-10-CM

## 2016-01-04 DIAGNOSIS — Z7982 Long term (current) use of aspirin: Secondary | ICD-10-CM

## 2016-01-04 DIAGNOSIS — E119 Type 2 diabetes mellitus without complications: Secondary | ICD-10-CM | POA: Insufficient documentation

## 2016-01-04 LAB — BASIC METABOLIC PANEL
ANION GAP: 14 (ref 5–15)
BUN: 37 mg/dL — ABNORMAL HIGH (ref 6–20)
CALCIUM: 10.1 mg/dL (ref 8.9–10.3)
CO2: 25 mmol/L (ref 22–32)
CREATININE: 1.56 mg/dL — AB (ref 0.44–1.00)
Chloride: 99 mmol/L — ABNORMAL LOW (ref 101–111)
GFR, EST AFRICAN AMERICAN: 39 mL/min — AB (ref >60)
GFR, EST NON AFRICAN AMERICAN: 34 mL/min — AB (ref >60)
Glucose, Bld: 223 mg/dL — ABNORMAL HIGH (ref 65–99)
Potassium: 4.3 mmol/L (ref 3.5–5.1)
SODIUM: 138 mmol/L (ref 135–145)

## 2016-01-04 LAB — BRAIN NATRIURETIC PEPTIDE: B NATRIURETIC PEPTIDE 5: 513.3 pg/mL — AB (ref 0.0–100.0)

## 2016-01-04 MED ORDER — ISOSORB DINITRATE-HYDRALAZINE 20-37.5 MG PO TABS: 1.0000 | ORAL_TABLET | Freq: Three times a day (TID) | ORAL | Status: DC

## 2016-01-04 MED ORDER — ISOSORBIDE DINITRATE 20 MG PO TABS: 20.0000 mg | ORAL_TABLET | Freq: Three times a day (TID) | ORAL | Status: DC

## 2016-01-04 MED ORDER — SITAGLIPTIN PHOS-METFORMIN HCL 50-1000 MG PO TABS: 1.0000 | ORAL_TABLET | Freq: Two times a day (BID) | ORAL | Status: DC

## 2016-01-04 MED ORDER — HYDRALAZINE HCL 25 MG PO TABS: 37.5000 mg | ORAL_TABLET | Freq: Three times a day (TID) | ORAL | Status: DC

## 2016-01-04 NOTE — Telephone Encounter (Signed)
CMA called patient, patient verified name and DOB. Patient was informed that her medication Janumet was sent to [facility] as well as her Hydralazine, Isosorbide medication, which was split into to meds instead of Bidil until she receive approval from the PASS program for the Bidil. Patient was given a 30 day supply of Janumet was told for additional refills she has to make a f/up appt. Patient states that when she comes tomorrow to pick up her medication, she will make an appt then. Patient verbalized she understood with no further.

## 2016-01-04 NOTE — Progress Notes (Signed)
   Subjective:    Patient ID: Christine Knight, female    DOB: 10-01-1951, 65 y.o.   MRN: [id number]  HPI  Patient here to establish care in [facility], recently hospitalized for dCHF exacerbation, EF 15-20%.  Was seen earlier today by Cardiology in follow up, meds reviewed and labs ordered.   Upon discharge her vitals were as follows: Discharge Weight Range: 237 lbs (Weight on admit 309 lbs) Discharge Vitals: Blood pressure 115/66, pulse 78, temperature 98.6 F (37 C), temperature source Oral, resp. rate 18, height  (1.702 m), weight 237 lb 8 oz (107.729 kg), SpO2 98 %.  Today her weight is 237#.   She denies chest pain or dyspnea; says she feels that she is getting slightly stronger with time. Is avoiding all added salt to food.   History DM2, needs refill of Janumet (has glipizide still).   Last A1C end-Jan was 8.3% (May 2016 was 11%).   Social Hx; Distant history smoking (quit over 40 yrs ago).    Review of Systems No fevers/chills, no cough, no dyspnea, no chest pain; no abd pain. Declines offer for flu shot.     Objective:   Physical Exam Well appearing, no distress HEENT neck supple, No JVD observed. No cervical adenopathy.  COR regular S1S2, no extra sounds PULM Clear bilat, no rales or wheezes EXTS trace-1+ bilateral ankle edema noted, symmetric.        Assessment & Plan:  1. DCHF, recent hospitalization: in follow up with Cardiology. Maintaining weight at discharge weight. Diuretics managed by CHF clinic, labs ordered today by them.  She says she has follow up there in 2 weeks.   2. DM2: recent A1C 8.3%, next due in 2-3 months. Refill Janumet; continue this with glipizide for now. Follow up here at time of next A1C.   Paula Compton, MD

## 2016-01-04 NOTE — Patient Instructions (Signed)
INCREASE Bidil to one tab, three times per day  Labs today  Be sure to use Tylenol for headaches   Your physician recommends that you schedule a follow-up appointment in: 2 weeks In the [facility]  Do the following things EVERYDAY: 1) Weigh yourself in the morning before breakfast. Write it down and keep it in a log. 2) Take your medicines as prescribed 3) Eat low salt foods-Limit salt (sodium) to 2000 mg per day.  4) Stay as active as you can everyday 5) Limit all fluids for the day to less than 2 liters 6)

## 2016-01-04 NOTE — Progress Notes (Signed)
Advanced Heart Failure Medication Review by a Pharmacist  Does the patient  feel that his/her medications are working for him/her?  yes  Has the patient been experiencing any side effects to the medications prescribed?  no  Does the patient measure his/her own blood pressure or blood glucose at home?  yes   Does the patient have any problems obtaining medications due to transportation or finances?   no  Understanding of regimen: good Understanding of indications: good Potential of compliance: good Patient understands to avoid NSAIDs. Patient understands to avoid decongestants.  Issues to address at subsequent visits: None   Pharmacist comments:  Ms. Clendenin is a pleasant 65 yo F presenting with her medication bottles. She reports excellent compliance with her medications but seemed overwhelmed with the amount of new HF medications she went home with after her last discharge. We discussed, in detail, the indications and side effects associated with each of her HF medications and she verbalized understanding of the importance of continued consistent use. She did not have any specific medication-related questions or concerns for me at this time.   Tyler Deis. Bonnye Fava, PharmD, BCPS, CPP Clinical Pharmacist Pager: [phone] Phone: [phone] 01/04/2016 11:10 AM      Time with patient: 16 minutes Preparation and documentation time: 2 minutes Total time: 18 minutes

## 2016-01-04 NOTE — Addendum Note (Signed)
Addended by: Barbaraann Barthel on: 01/04/2016 05:14 PM   Modules accepted: Orders

## 2016-01-04 NOTE — Addendum Note (Signed)
Addended by: Benjamin Stain L on: 01/04/2016 05:20 PM   Modules accepted: Orders

## 2016-01-04 NOTE — Progress Notes (Signed)
Patient ID: Christine Knight, female   DOB: 03-16-1951, 65 y.o.   MRN: [id number]  Patient having difficulty accessing Bidil due to cost. Rx for hydralazine and isosorbide dinitrate as separate medications until the bidil patient assistance program is completed.   Paula Compton, MD

## 2016-01-04 NOTE — Patient Instructions (Signed)
It was a pleasure to meet you today.   As we discussed, I sent in the Rx for the Janumet to your pharmacy.   PASS program at our front desk for the Bidil medication.   Follow up in this office in 2 months for diabetes follow up; call sooner if needed.   Keep cardiology follow up in 2 weeks as per Cardiology.

## 2016-01-04 NOTE — Progress Notes (Signed)
Patient's here for hospital f/up for Congestive Heart Failure.   Patient states she's not having in pain today.  Patient declines A1c screening and Flu shot.

## 2016-01-05 MED FILL — !JANUMET 50-1000MG TABLET: 50-1000 | 30 days supply | Qty: 60 | Fill #0

## 2016-01-05 MED FILL — hydrALAZINE HCL 25 MG TABS: 25 | 30 days supply | Qty: 135 | Fill #0

## 2016-01-17 ENCOUNTER — Other Ambulatory Visit: Payer: Self-pay | Admitting: Internal Medicine

## 2016-01-17 MED ORDER — ISOSORB DINITRATE-HYDRALAZINE 20-37.5 MG PO TABS: 1.0000 | ORAL_TABLET | Freq: Three times a day (TID) | ORAL | Status: DC

## 2016-01-17 NOTE — Progress Notes (Signed)
Patient ID: Christine Knight, female   DOB: [birth year], 65 y.o.   MRN: [id number]    Advanced Heart Failure Clinic Note   PCP: None HF: Dr. Haroldine Laws   HPI: Christine Knight is a 65 y.o. female w/ PMHx significant for morbid obesity, systolic HF due to ICM (37/04 EF 25%; 07/2012 EF 35%; 03/18/13 EF 20-25%), CAD with h/o NSTEMI s/p CABG Oct 2012, pulmonary hypertension (PA peak 52mHg), and poorly controlled DM 2.  [facility] edema, and weight gain.  Lasix gtt used and she diuresed 15 liters. Diuresed 29 pounds.  ECHO- EF 20-25%.  Discharge weight 264 pounds. Christine Knight d/t dizziness and hyperkalemia   Admitted 12/08/2015 - 12/28/2015 with worsening DOE and cough. Stated she had a 40 lb weight since 07/2015. She was diuresed aggressively with lasix gtt at 30 mg/hr and metolazone 5 mg BID. Diamox eventually added. Diuretics held temporarily towards end of stay with worsening Creatinine. Overall she diuresed >40 L and was down 70 lbs from admission weight. Pt had R/LHC with preserved CO and mild/mod Pulm HTN. Severe native CAD with patent CABG grafts. Discharge weight 237 lbs.   She presents today for regular follow up.  At last visit increased Bidil. She lost her insurance, so was switched from Bidil to Imdur/Hydralazine. It looks like Imdur was not started, and hydralazine stopped last week after SBPs in 70-80s at home with dizziness. Weight at 235 at this morning.  Denies SOB. Going 30 minutes at a time on the treadmill now, going at speed 2 (was doing 10 minutes x 2 at last visit).  No orthopnea or bendopnea.  Denies edema. Watching salt and fluid intake.  She does not feel ready to go back to work.  She states that she would like to go back, but hasn't worked since May 02, 2015. They still have her job on hold. She is on medical leave, but short term disability denied and insurance lapsed.   Labs 2/17 K 4.3, Cr 1.56  03/18/13 ECHO EF 20-25% 06/17/2013 ECHO EF 35% RV ok  (read formally after visit 35-40%) 03/15/2015 ECHO EF 30-35%, grade 3 DD, Moderate TR, Severely reduced RV, PA peak pressure 81 mm Hg 12/09/2015 ECHO EF 15-20%, Decreased LV diastolic compliance, RV mod reduced, PA peak 57 mmHg, Severe TR   [facility] Hemodynamics RA 25/25 (23) RV pressure 92/13  RV EDP 26 PA pressure 86/34 (56) PW mean 37 AO Pressure 114/72 (88) LV Pressure 101/12 (26) LV EDP 26 PVR 3.24 Fick CO/CI 5.85 / 2.41L/min   Prox LAD lesion, 100% stenosed. Prox Cx lesion, 99% stenosed. Mid RCA lesion, 80% stenosed. Dist RCA lesion, 100% stenosed. Patent grafts  ROS: All systems negative except as listed in HPI, PMH and Problem List.  Past Medical History  Diagnosis Date  . Obesity, morbid ([facility]   . Chronic systolic heart failure (HCC)     Chronic systolic CHF  . Hyperlipidemia 08/10/11  . Diabetes mellitus type 2 in obese ([place] 08/10/11  . CAD (coronary artery disease) 08/2011    NSTEMI with subsequent CABG 08/13/2011 with LIMA-LAD, SVG-diagonal, SVG-OM, SVG-PDA  . Ischemic cardiomyopathy 08/13/2011    EF 20% in 08/2011, still 25% 10/21/2011  . Pleural effusion     Requiring L thoracentesis 09/02/11  . Mitral regurgitation     Mild by echo 10/21/11  . Hypertension   . Pulmonary hypertension ([place]     Echo, September, 2013, 72 mmHg.  .Marland KitchenEjection fraction <  50%     EF 20%, October, 2012  //  EF 25% December, 2012  //   EF 35%, echo, September, 2013  . Myocardial infarction ([place]) 08/2011  . Orthopnoea     "progressively worse over last 3 wks" (03/17/2013)  . CHF (congestive heart failure) ([place])   . Carpal tunnel syndrome on both sides     "post OHS in 08/2011; resolved now" (03/17/2013)    Current Outpatient Prescriptions  Medication Sig Dispense Refill  . acetaminophen (TYLENOL) 325 MG tablet Take 650 mg by mouth every 6 (six) hours as needed for mild pain.    Marland Kitchen aspirin 81 MG tablet Take 1 tablet (81 mg total) by mouth daily. 30 tablet 9  . atorvastatin  (LIPITOR) 10 MG tablet Take 1 tablet (10 mg total) by mouth daily. 30 tablet 6  . Blood Glucose Monitoring Suppl (ONETOUCH VERIO) W/DEVICE KIT 1 Act by Does not apply route 3 (three) times daily. 2 kit 0  . carvedilol (COREG) 3.125 MG tablet Take 1 tablet (3.125 mg total) by mouth 2 (two) times daily with a meal. 60 tablet 6  . glipiZIDE (GLUCOTROL) 10 MG tablet Take 10 mg by mouth 2 (two) times daily.  1  . glucose blood test strip Use TID 100 each 12  . Multiple Vitamin (MULTIVITAMIN WITH MINERALS) TABS tablet Take 1 tablet by mouth daily.    . potassium chloride SA (K-DUR,KLOR-CON) 20 MEQ tablet Take 1 tablet (20 mEq total) by mouth daily. 30 tablet 6  . sitaGLIPtin-metformin (JANUMET) 50-1000 MG tablet Take 1 tablet by mouth 2 (two) times daily with a meal. 60 tablet 0  . spironolactone (ALDACTONE) 25 MG tablet Take 0.5 tablets (12.5 mg total) by mouth daily. 15 tablet 6  . torsemide (DEMADEX) 20 MG tablet Take 2 tablets (40 mg total) by mouth 2 (two) times daily. 120 tablet 6  . [DISCONTINUED] simvastatin (ZOCOR) 20 MG tablet Take 1 tablet (20 mg total) by mouth every evening. 90 tablet 1   No current facility-administered medications for this encounter.     PHYSICAL EXAM: Filed Vitals:   01/18/16 1005  BP: 124/74  Pulse: 62  Weight: 239 lb 3.2 oz (108.5 kg)  SpO2: 100%   General:NAD HEENT: normal Neck: JVP 6-7 cm. Carotids 2+ bilat; no bruits. No thyromegaly or nodule noted. Cor: PMI nondisplaced. Distant, Regular with PVCs, slightly brady. No M/G/R Lungs: CTAB, normal effort Abdomen: Morbidly obese, soft, NT, ND, no HSM. No bruits or masses. +BS  Extremities: no cyanosis, clubbing, rash. No edema.  Neuro: alert & orientedx3, cranial nerves grossly intact. moves all 4 extremities w/o difficulty. Affect pleasant  EKG: NSR 67s with PVC  ASSESSMENT/PLAN  1. 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 - Volume status stable on exam.  -  Continue 40 mg torsemide  BID with 20 meq K daily   - Continue coreg 3.125 mg BID. Will not increase with slow HR today.  - Can no longer do bidil with insurance situation.  Stopped hydralazine last week with dizziness and home pressure in 70s.   - Discussed case with pharm.  Will start on low dose ARB and see back in <2 weeks for further med titration and labs with pharmacy.  - Continue spironolactone 12.5 mg daily - Plan on rechecking Echo 2-3 months after med optimization. 2. CAD s/p NSTEMI and CABG 10/12  - Severe native CAD with patent grafts on LHC 12/13/15 - Continue ASA 81 mg BID  and atorvastatin 10 mg daily.  3. Morbid obesity - Encouraged to watch portions and continue to increase activity.   4. Pulmonary HTN - mild to mod RHC 12/13/15. PVR 3.24.  5. CKD stage III - Repeat BMET today 6. DM2  7. Social - on medical leave from work, but insurance has lapsed.  Will have CSW see.   BMET today. Losartan 12.5 mg today with mortality benefit. Will follow up 2 weeks with pharmacy and 1 month with Dr. Haroldine Laws.    Shirley Friar, PA-C 10:12 AM

## 2016-01-18 ENCOUNTER — Encounter: Payer: Self-pay | Admitting: Licensed Clinical Social Worker

## 2016-01-18 ENCOUNTER — Ambulatory Visit (HOSPITAL_COMMUNITY)
Admission: RE | Admit: 2016-01-18 | Discharge: 2016-01-18 | Disposition: A | Discharge status: Home / Self Care | Payer: Medicaid Other | Source: Ambulatory Visit | Attending: Cardiology | Admitting: Cardiology

## 2016-01-18 ENCOUNTER — Encounter (HOSPITAL_COMMUNITY): Payer: Self-pay

## 2016-01-18 VITALS — BP 124/74 | HR 62 | Wt 239.2 lb

## 2016-01-18 DIAGNOSIS — E1122 Type 2 diabetes mellitus with diabetic chronic kidney disease: Secondary | ICD-10-CM | POA: Insufficient documentation

## 2016-01-18 DIAGNOSIS — Z951 Presence of aortocoronary bypass graft: Secondary | ICD-10-CM

## 2016-01-18 DIAGNOSIS — E1140 Type 2 diabetes mellitus with diabetic neuropathy, unspecified: Secondary | ICD-10-CM

## 2016-01-18 DIAGNOSIS — I2510 Atherosclerotic heart disease of native coronary artery without angina pectoris: Secondary | ICD-10-CM | POA: Insufficient documentation

## 2016-01-18 DIAGNOSIS — Z7984 Long term (current) use of oral hypoglycemic drugs: Secondary | ICD-10-CM | POA: Insufficient documentation

## 2016-01-18 DIAGNOSIS — I130 Hypertensive heart and chronic kidney disease with heart failure and stage 1 through stage 4 chronic kidney disease, or unspecified chronic kidney disease: Secondary | ICD-10-CM | POA: Diagnosis not present

## 2016-01-18 DIAGNOSIS — I255 Ischemic cardiomyopathy: Secondary | ICD-10-CM | POA: Insufficient documentation

## 2016-01-18 DIAGNOSIS — E785 Hyperlipidemia, unspecified: Secondary | ICD-10-CM

## 2016-01-18 DIAGNOSIS — I272 Other secondary pulmonary hypertension: Secondary | ICD-10-CM | POA: Diagnosis not present

## 2016-01-18 DIAGNOSIS — Z79899 Other long term (current) drug therapy: Secondary | ICD-10-CM | POA: Insufficient documentation

## 2016-01-18 DIAGNOSIS — Z7982 Long term (current) use of aspirin: Secondary | ICD-10-CM | POA: Diagnosis not present

## 2016-01-18 DIAGNOSIS — E1165 Type 2 diabetes mellitus with hyperglycemia: Secondary | ICD-10-CM

## 2016-01-18 DIAGNOSIS — N183 Chronic kidney disease, stage 3 (moderate): Secondary | ICD-10-CM | POA: Insufficient documentation

## 2016-01-18 DIAGNOSIS — I5022 Chronic systolic (congestive) heart failure: Secondary | ICD-10-CM

## 2016-01-18 DIAGNOSIS — I252 Old myocardial infarction: Secondary | ICD-10-CM | POA: Diagnosis not present

## 2016-01-18 DIAGNOSIS — IMO0002 Reserved for concepts with insufficient information to code with codable children: Secondary | ICD-10-CM

## 2016-01-18 LAB — BASIC METABOLIC PANEL
ANION GAP: 9 (ref 5–15)
BUN: 29 mg/dL — AB (ref 6–20)
CHLORIDE: 102 mmol/L (ref 101–111)
CO2: 28 mmol/L (ref 22–32)
Calcium: 10.2 mg/dL (ref 8.9–10.3)
Creatinine, Ser: 1.19 mg/dL — ABNORMAL HIGH (ref 0.44–1.00)
GFR, EST AFRICAN AMERICAN: 55 mL/min — AB (ref >60)
GFR, EST NON AFRICAN AMERICAN: 47 mL/min — AB (ref >60)
Glucose, Bld: 53 mg/dL — ABNORMAL LOW (ref 65–99)
POTASSIUM: 4 mmol/L (ref 3.5–5.1)
SODIUM: 139 mmol/L (ref 135–145)

## 2016-01-18 MED ORDER — LOSARTAN POTASSIUM 25 MG PO TABS: 12.5000 mg | ORAL_TABLET | Freq: Every day | ORAL | Status: DC

## 2016-01-18 MED FILL — ATORVASTATIN 20 MG TABLET: 20 | 34 days supply | Qty: 17 | Fill #0

## 2016-01-18 MED FILL — LOSARTAN POTASSIUM 25 MG TA: 25 | 34 days supply | Qty: 17 | Fill #0

## 2016-01-18 MED FILL — SPIRONOLACTONE 25 MG TABLET: 25 | 34 days supply | Qty: 17 | Fill #0

## 2016-01-18 MED FILL — POTASSIUM CL ER 20 MEQ TAB: 20 | 34 days supply | Qty: 34 | Fill #0

## 2016-01-18 MED FILL — CARVEDILOL 3.125 MG TABLET: 3.125 | 34 days supply | Qty: 68 | Fill #0

## 2016-01-18 MED FILL — TORSEMIDE 20 MG TABLET: 20 | 34 days supply | Qty: 136 | Fill #0

## 2016-01-18 NOTE — Progress Notes (Signed)
CSW referred to assist with resources and disability. Patient reports she has a pending disability and medicaid application although currently has no insurance or income. Patient reports she lost her house and is currently staying with her sister "on the couch". Patient states she is "on medical leave" at [employer] but unsure if she will return to work due to illness. Patient will follow up with medicaid on status and return call to CSW for further assistance if needed. Patient assisted with medications through the HF fund. CSW will continue to be avilable as needed. Lasandra BeechJackie Leonda Cristo, LCSW [phone]

## 2016-01-18 NOTE — Progress Notes (Signed)
Advanced Heart Failure Medication Review by a Pharmacist  Does the patient  feel that his/her medications are working for him/her?  yes  Has the patient been experiencing any side effects to the medications prescribed?  Yes (Hydralazine see below)  Does the patient measure his/her own blood pressure or blood glucose at home?  yes   Does the patient have any problems obtaining medications due to transportation or finances?   yes  Understanding of regimen: good Understanding of indications: good Potential of compliance: good Patient understands to avoid NSAIDs. Patient understands to avoid decongestants.  Issues to address at subsequent visits: None   Pharmacist comments: 65 YO pleasant female presenting for HF followup. Pt states that her insurance ran out and [facility] after her BiDil ran out and was going to cost $400.  Pt was switched to hydralazine/imdur but never picked up the imdur prescription.  She experienced symptomatic hypotension (BP 78/60s) with hydralazine and stopped after 3 days.  Since stopping she states that her BP has normalized (115-120/60-70 now normally) and she no longer experiences dizziness.  We will enroll patient in Heart Failure [facility] so she can receive all of her heart failure meds for free until she gets insurance.     Time with patient: 10 min Preparation and documentation time: 5 min  Total time: 15 min

## 2016-01-18 NOTE — Patient Instructions (Signed)
STOP Hydralazine START Losartan 12.5 mg, daily at bedtime  Your physician recommends that you schedule a follow-up appointment in: 2 weeks with the CHF pharmacist and in  1 month with Dr.Bensimhon  Do the following things EVERYDAY: 1) Weigh yourself in the morning before breakfast. Write it down and keep it in a log. 2) Take your medicines as prescribed 3) Eat low salt foods-Limit salt (sodium) to 2000 mg per day.  4) Stay as active as you can everyday 5) Limit all fluids for the day to less than 2 liters 6)

## 2016-01-29 ENCOUNTER — Ambulatory Visit (HOSPITAL_COMMUNITY): Payer: BLUE CROSS/BLUE SHIELD

## 2016-01-30 ENCOUNTER — Telehealth (HOSPITAL_COMMUNITY): Payer: Self-pay | Admitting: Cardiac Rehabilitation

## 2016-01-30 NOTE — Telephone Encounter (Signed)
Pt arrived at cardiac rehab to report she is not interested in cardiac rehab at this time due to financial barrier.  Pt is applying for medicaid. Pt is being followed by heart failure clinic. Pt is exercising on her own.

## 2016-02-01 ENCOUNTER — Inpatient Hospital Stay (HOSPITAL_COMMUNITY): Admission: RE | Admit: 2016-02-01 | Payer: BLUE CROSS/BLUE SHIELD | Source: Ambulatory Visit

## 2016-02-02 ENCOUNTER — Telehealth (HOSPITAL_COMMUNITY): Payer: Self-pay | Admitting: Vascular Surgery

## 2016-02-02 NOTE — Telephone Encounter (Signed)
Left pt message to reschedule appt  

## 2016-02-05 NOTE — Telephone Encounter (Signed)
Left pt second message to reschedule appt due to holiday

## 2016-02-06 ENCOUNTER — Telehealth (HOSPITAL_COMMUNITY): Payer: Self-pay | Admitting: Vascular Surgery

## 2016-02-06 NOTE — Telephone Encounter (Signed)
Left message to reschedule pt appt /will send pt a letter

## 2016-02-21 MED FILL — CARVEDILOL 3.125 MG TABLET: 3.125 | 34 days supply | Qty: 68 | Fill #1

## 2016-02-21 MED FILL — LOSARTAN POTASSIUM 25 MG TA: 25 | 34 days supply | Qty: 17 | Fill #1

## 2016-02-21 MED FILL — ATORVASTATIN 20 MG TABLET: 20 | 34 days supply | Qty: 17 | Fill #1

## 2016-02-21 MED FILL — KLOR-CON M20 TABLET: 20 | 34 days supply | Qty: 34 | Fill #1

## 2016-02-21 MED FILL — SPIRONOLACTONE 25 MG TABLET: 25 | 34 days supply | Qty: 17 | Fill #1

## 2016-02-21 MED FILL — TORSEMIDE 20 MG TABLET: 20 | 34 days supply | Qty: 136 | Fill #1

## 2016-02-23 ENCOUNTER — Encounter (HOSPITAL_COMMUNITY): Payer: BLUE CROSS/BLUE SHIELD | Admitting: Internal Medicine

## 2016-03-04 ENCOUNTER — Encounter (HOSPITAL_COMMUNITY): Payer: BLUE CROSS/BLUE SHIELD | Admitting: Internal Medicine

## 2016-03-04 ENCOUNTER — Encounter (HOSPITAL_COMMUNITY): Payer: BLUE CROSS/BLUE SHIELD

## 2016-03-27 MED FILL — KLOR-CON M20 TABLET: 20 | 34 days supply | Qty: 34 | Fill #2

## 2016-03-27 MED FILL — SPIRONOLACTONE 25 MG TABLET: 25 | 34 days supply | Qty: 17 | Fill #2

## 2016-03-27 MED FILL — CARVEDILOL 3.125 MG TABLET: 3.125 | 34 days supply | Qty: 68 | Fill #2

## 2016-03-27 MED FILL — TORSEMIDE 20 MG TABLET: 20 | 34 days supply | Qty: 136 | Fill #2

## 2016-03-27 MED FILL — ATORVASTATIN 20 MG TABLET: 20 | 34 days supply | Qty: 17 | Fill #2

## 2016-03-27 MED FILL — LOSARTAN POTASSIUM 25 MG TA: 25 | 34 days supply | Qty: 17 | Fill #2

## 2016-04-11 ENCOUNTER — Encounter (HOSPITAL_COMMUNITY): Payer: Self-pay

## 2016-04-11 ENCOUNTER — Emergency Department (HOSPITAL_COMMUNITY): Payer: Medicaid Other

## 2016-04-11 ENCOUNTER — Inpatient Hospital Stay (HOSPITAL_COMMUNITY)
Admission: EM | Admit: 2016-04-11 | Discharge: 2016-04-26 | DRG: 291 | Disposition: A | Discharge status: Home Health | Payer: Medicaid Other | Attending: Internal Medicine | Admitting: Internal Medicine

## 2016-04-11 DIAGNOSIS — Z8249 Family history of ischemic heart disease and other diseases of the circulatory system: Secondary | ICD-10-CM | POA: Diagnosis not present

## 2016-04-11 DIAGNOSIS — E876 Hypokalemia: Secondary | ICD-10-CM | POA: Diagnosis present

## 2016-04-11 DIAGNOSIS — E1140 Type 2 diabetes mellitus with diabetic neuropathy, unspecified: Secondary | ICD-10-CM | POA: Diagnosis present

## 2016-04-11 DIAGNOSIS — I272 Other secondary pulmonary hypertension: Secondary | ICD-10-CM | POA: Diagnosis present

## 2016-04-11 DIAGNOSIS — N179 Acute kidney failure, unspecified: Secondary | ICD-10-CM | POA: Diagnosis present

## 2016-04-11 DIAGNOSIS — Z951 Presence of aortocoronary bypass graft: Secondary | ICD-10-CM

## 2016-04-11 DIAGNOSIS — I5043 Acute on chronic combined systolic (congestive) and diastolic (congestive) heart failure: Secondary | ICD-10-CM | POA: Diagnosis present

## 2016-04-11 DIAGNOSIS — E1165 Type 2 diabetes mellitus with hyperglycemia: Secondary | ICD-10-CM | POA: Diagnosis present

## 2016-04-11 DIAGNOSIS — Z888 Allergy status to other drugs, medicaments and biological substances status: Secondary | ICD-10-CM | POA: Diagnosis not present

## 2016-04-11 DIAGNOSIS — E785 Hyperlipidemia, unspecified: Secondary | ICD-10-CM | POA: Diagnosis present

## 2016-04-11 DIAGNOSIS — Z7982 Long term (current) use of aspirin: Secondary | ICD-10-CM | POA: Diagnosis not present

## 2016-04-11 DIAGNOSIS — I130 Hypertensive heart and chronic kidney disease with heart failure and stage 1 through stage 4 chronic kidney disease, or unspecified chronic kidney disease: Secondary | ICD-10-CM | POA: Diagnosis present

## 2016-04-11 DIAGNOSIS — Z87891 Personal history of nicotine dependence: Secondary | ICD-10-CM | POA: Diagnosis not present

## 2016-04-11 DIAGNOSIS — E875 Hyperkalemia: Secondary | ICD-10-CM | POA: Diagnosis not present

## 2016-04-11 DIAGNOSIS — M109 Gout, unspecified: Secondary | ICD-10-CM | POA: Diagnosis present

## 2016-04-11 DIAGNOSIS — I2510 Atherosclerotic heart disease of native coronary artery without angina pectoris: Secondary | ICD-10-CM | POA: Diagnosis present

## 2016-04-11 DIAGNOSIS — E11649 Type 2 diabetes mellitus with hypoglycemia without coma: Secondary | ICD-10-CM | POA: Diagnosis not present

## 2016-04-11 DIAGNOSIS — N183 Chronic kidney disease, stage 3 (moderate): Secondary | ICD-10-CM | POA: Diagnosis not present

## 2016-04-11 DIAGNOSIS — N184 Chronic kidney disease, stage 4 (severe): Secondary | ICD-10-CM | POA: Diagnosis present

## 2016-04-11 DIAGNOSIS — E1122 Type 2 diabetes mellitus with diabetic chronic kidney disease: Secondary | ICD-10-CM | POA: Diagnosis present

## 2016-04-11 DIAGNOSIS — I252 Old myocardial infarction: Secondary | ICD-10-CM

## 2016-04-11 DIAGNOSIS — I255 Ischemic cardiomyopathy: Secondary | ICD-10-CM | POA: Diagnosis present

## 2016-04-11 DIAGNOSIS — Z6841 Body Mass Index (BMI) 40.0 and over, adult: Secondary | ICD-10-CM

## 2016-04-11 DIAGNOSIS — IMO0002 Reserved for concepts with insufficient information to code with codable children: Secondary | ICD-10-CM | POA: Diagnosis present

## 2016-04-11 DIAGNOSIS — Z7984 Long term (current) use of oral hypoglycemic drugs: Secondary | ICD-10-CM

## 2016-04-11 DIAGNOSIS — I509 Heart failure, unspecified: Secondary | ICD-10-CM | POA: Diagnosis not present

## 2016-04-11 DIAGNOSIS — R0600 Dyspnea, unspecified: Secondary | ICD-10-CM | POA: Diagnosis present

## 2016-04-11 DIAGNOSIS — R0602 Shortness of breath: Secondary | ICD-10-CM

## 2016-04-11 DIAGNOSIS — I5023 Acute on chronic systolic (congestive) heart failure: Secondary | ICD-10-CM | POA: Diagnosis present

## 2016-04-11 LAB — BRAIN NATRIURETIC PEPTIDE: B NATRIURETIC PEPTIDE 5: 1176.5 pg/mL — AB (ref 0.0–100.0)

## 2016-04-11 LAB — CBC
HEMATOCRIT: 43.1 % (ref 36.0–46.0)
HEMOGLOBIN: 13.8 g/dL (ref 12.0–15.0)
MCH: 30.8 pg (ref 26.0–34.0)
MCHC: 32 g/dL (ref 30.0–36.0)
MCV: 96.2 fL (ref 78.0–100.0)
Platelets: 218 10*3/uL (ref 150–400)
RBC: 4.48 MIL/uL (ref 3.87–5.11)
RDW: 17.6 % — AB (ref 11.5–15.5)
WBC: 4.7 10*3/uL (ref 4.0–10.5)

## 2016-04-11 LAB — BASIC METABOLIC PANEL
ANION GAP: 7 (ref 5–15)
BUN: 27 mg/dL — AB (ref 6–20)
CHLORIDE: 101 mmol/L (ref 101–111)
CO2: 29 mmol/L (ref 22–32)
Calcium: 9.5 mg/dL (ref 8.9–10.3)
Creatinine, Ser: 1.49 mg/dL — ABNORMAL HIGH (ref 0.44–1.00)
GFR calc Af Amer: 42 mL/min — ABNORMAL LOW (ref >60)
GFR, EST NON AFRICAN AMERICAN: 36 mL/min — AB (ref >60)
GLUCOSE: 110 mg/dL — AB (ref 65–99)
POTASSIUM: 4.3 mmol/L (ref 3.5–5.1)
SODIUM: 137 mmol/L (ref 135–145)

## 2016-04-11 LAB — I-STAT TROPONIN, ED: Troponin i, poc: 0.04 ng/mL (ref 0.00–0.08)

## 2016-04-11 MED ORDER — FUROSEMIDE 10 MG/ML IJ SOLN
40.0000 mg | Freq: Once | INTRAMUSCULAR | Status: AC
  Administered 2016-04-11: 40 mg via INTRAVENOUS
  Filled 2016-04-11: qty 4

## 2016-04-11 NOTE — ED Notes (Signed)
Patient here with 1 week of increasing chest pain and shortness of breath. Goes to heart failure clinic and no appointment to late June. Has noticed increased lower extremity edema for same.

## 2016-04-11 NOTE — H&P (Addendum)
Christine Knight is an 65 y.o. female.    Chief Complaint: dyspnea and weight gain Primary Cardiologist: Dr. Haroldine Laws HPI: Christine Knight is a 65 yo woman with PMH of ischemic cardiomyopathy and chronic systolic heart failure with last known EF ~ 15-20%, CABG 08/2011, morbid obesity, pulmonary hypertension (PA pressure 72 mmHg), poorly controlled T2DM who presents with weight gain and dyspnea and was last seen in HF clinic 01/04/16 and 01/18/16 after a HF hospitalization 1/27 - 12/28/15 with 40 lb weight gain treated with lasix gtt at 30 mg/hr and metolazone 5 mg bid with some diamox with 70 lb diuresis and discharge weight of 237 lbs and RHC/LHC revealing preserved CO and mild/moderate pulmonary hypertension with severe native CAD and patent grafts. She weighed 234 lbs in HF clinic at that time and she endorses medication compliance, walking on the treadmill for 1/2 mile at 26mh. Her home diuretic is 40 mg bid torsemide. She was being treated with coreg 3.125 mg bid, bidil 1 tab tid, SL 12.5 mg, losartan 12.5 mg daily.   She says she's been gaining weight over the last one month. She's noticed early satiety and thinks things were triggered because of gout and joint/musculoskeletal pain. Her pain has improved but her dyspnea, orthopnea and PND have progressed. She denies sick contacts. She endorses medication compliance. She is accompanied by her daughter. She received 40 mg IV lasix in the ER with minimal response.    Past Medical History  Diagnosis Date  . Obesity, morbid ([place]   . Chronic systolic heart failure (HCC)     Chronic systolic CHF  . Hyperlipidemia 08/10/11  . Diabetes mellitus type 2 in obese ([place] 08/10/11  . CAD (coronary artery disease) 08/2011    NSTEMI with subsequent CABG 08/13/2011 with LIMA-LAD, SVG-diagonal, SVG-OM, SVG-PDA  . Ischemic cardiomyopathy 08/13/2011    EF 20% in 08/2011, still 25% 10/21/2011  . Pleural effusion     Requiring L thoracentesis 09/02/11  . Mitral  regurgitation     Mild by echo 10/21/11  . Hypertension   . Pulmonary hypertension ([place]     Echo, September, 2013, 72 mmHg.  .Marland KitchenEjection fraction < 50%     EF 20%, October, 2012  //  EF 25% December, 2012  //   EF 35%, echo, September, 2013  . Myocardial infarction ([place] 08/2011  . Orthopnoea     "progressively worse over last 3 wks" (03/17/2013)  . CHF (congestive heart failure) ([place]   . Carpal tunnel syndrome on both sides     "post OHS in 08/2011; resolved now" (03/17/2013)    Past Surgical History  Procedure Laterality Date  . Laparoscopy  1980's?    "removed gallstones; not gallbladder" (03/17/2013)  . Coronary artery bypass graft  08/13/11    CABG x4 with LIMA to LAD, SVG to Diag, SVG to OM, SVG to PDA, EVH via both thighs  . Cardiac catheterization N/A 12/13/2015    Procedure: Right/Left Heart Cath and Coronary/Graft Angiography;  Surgeon: Peter M JMartinique MD;  [facility];  Service: Cardiovascular;  Laterality: N/A;    Family History  Problem Relation Age of Onset  . Heart disease Father   . Cancer Neg Hx   . Diabetes Neg Hx   . Kidney disease Neg Hx   . Hypertension Maternal Grandmother   . Hypertension Sister    Social History:  reports that she quit smoking about 37 years ago. Her smoking use included Cigarettes. She has never used  smokeless tobacco. She reports that she drinks alcohol. She reports that she does not use illicit drugs.  Allergies:  Allergies  Allergen Reactions  . Lisinopril Cough    cough  . Metformin And Related Anaphylaxis     (Not in a hospital admission)  Results for orders placed or performed during the hospital encounter of 04/11/16 (from the past 48 hour(s))  I-stat troponin, ED     Status: None   Collection Time: 04/11/16  7:11 PM  Result Value Ref Range   Troponin i, poc 0.04 0.00 - 0.08 ng/mL   Comment 3            Comment: Due to the release kinetics of cTnI, a negative result within the first hours of the onset of  symptoms does not rule out myocardial infarction with certainty. If myocardial infarction is still suspected, repeat the test at appropriate intervals.   Basic metabolic panel     Status: Abnormal   Collection Time: 04/11/16  7:29 PM  Result Value Ref Range   Sodium 137 135 - 145 mmol/L   Potassium 4.3 3.5 - 5.1 mmol/L   Chloride 101 101 - 111 mmol/L   CO2 29 22 - 32 mmol/L   Glucose, Bld 110 (H) 65 - 99 mg/dL   BUN 27 (H) 6 - 20 mg/dL   Creatinine, Ser 1.49 (H) 0.44 - 1.00 mg/dL   Calcium 9.5 8.9 - 10.3 mg/dL   GFR calc non Af Amer 36 (L) >60 mL/min   GFR calc Af Amer 42 (L) >60 mL/min    Comment: (NOTE) The eGFR has been calculated using the CKD EPI equation. This calculation has not been validated in all clinical situations. eGFR's persistently <60 mL/min signify possible Chronic Kidney Disease.    Anion gap 7 5 - 15  CBC     Status: Abnormal   Collection Time: 04/11/16  7:29 PM  Result Value Ref Range   WBC 4.7 4.0 - 10.5 K/uL   RBC 4.48 3.87 - 5.11 MIL/uL   Hemoglobin 13.8 12.0 - 15.0 g/dL   HCT 43.1 36.0 - 46.0 %   MCV 96.2 78.0 - 100.0 fL   MCH 30.8 26.0 - 34.0 pg   MCHC 32.0 30.0 - 36.0 g/dL   RDW 17.6 (H) 11.5 - 15.5 %   Platelets 218 150 - 400 K/uL  Brain natriuretic peptide     Status: Abnormal   Collection Time: 04/11/16  7:29 PM  Result Value Ref Range   B Natriuretic Peptide 1176.5 (H) 0.0 - 100.0 pg/mL   Dg Chest 2 View  04/11/2016  CLINICAL DATA:  Shortness of breath for several weeks, worsened over the past 3 days. 30 pound weight gain over the past month. EXAM: CHEST  2 VIEW COMPARISON:  Single view of the chest 12/08/2015. PA and lateral chest 03/13/2015. FINDINGS: There is cardiomegaly. The patient is status post CABG. Bibasilar airspace disease appears worse on the left. There are likely bilateral pleural effusions. Mild interstitial edema is seen. IMPRESSION: Cardiomegaly and mild interstitial edema. Likely bilateral pleural effusions and basilar  airspace disease which could be atelectasis or pneumonia. Electronically Signed   By: Inge Rise M.D.   On: 04/11/2016 20:11    Review of Systems  Constitutional: Positive for malaise/fatigue. Negative for fever, chills, weight loss and diaphoresis.  HENT: Negative for ear pain and tinnitus.   Eyes: Negative for double vision and photophobia.  Respiratory: Positive for shortness of breath. Negative for hemoptysis and  sputum production.   Cardiovascular: Positive for orthopnea, leg swelling and PND. Negative for chest pain and palpitations.  Gastrointestinal: Positive for nausea. Negative for vomiting and abdominal pain.  Genitourinary: Negative for dysuria, urgency and frequency.  Musculoskeletal: Positive for joint pain. Negative for myalgias and neck pain.  Skin: Negative for rash.  Neurological: Negative for tingling, tremors and sensory change.  Endo/Heme/Allergies: Negative for polydipsia.  Psychiatric/Behavioral: Negative for suicidal ideas, hallucinations and substance abuse.    Blood pressure 111/74, pulse 73, temperature 98.7 F (37.1 C), temperature source Oral, resp. rate 16, height '5\' 6"'$  (1.676 m), weight 124.558 kg (274 lb 9.6 oz), SpO2 94 %. Physical Exam  Nursing note and vitals reviewed. Constitutional: She is oriented to person, place, and time. She appears well-developed and well-nourished. No distress.  HENT:  Head: Normocephalic and atraumatic.  Nose: Nose normal.  Mouth/Throat: Oropharynx is clear and moist. No oropharyngeal exudate.  Eyes: Conjunctivae and EOM are normal. Pupils are equal, round, and reactive to light. No scleral icterus.  Neck: Normal range of motion. Neck supple. JVD present. No tracheal deviation present.  Cardiovascular: Normal rate, regular rhythm, normal heart sounds and intact distal pulses.  Exam reveals no gallop.   No murmur heard. Respiratory: Effort normal. No respiratory distress. She has no wheezes. She has rales.  GI: Soft.  Bowel sounds are normal. She exhibits no distension. There is no tenderness. There is no rebound.  Musculoskeletal: Normal range of motion. She exhibits edema. She exhibits no tenderness.  Neurological: She is alert and oriented to person, place, and time. No cranial nerve deficit.  Skin: Skin is warm and dry. She is not diaphoretic.  Psychiatric: She has a normal mood and affect. Her behavior is normal. Thought content normal.    RHC/LHC 12/13/15: RAP 23, RV 92/13, PA 86/34, mean 56, PCWP 37, Ao 114/72, mean 88, LV 101/12, LVEDP 26, PVR 3.2, Fick CO/CI 5.85/2.4 prox LAD 100%, prox LCx 99%, mid RCA 80%, distal RCA 100%, patent grafts K 4.3, bun/cr 27/1.49, BNP 1176, h/h 13.8/43.1, plt 218 Chest x-ray: atelectasis, bilateral pulmonary edema EKG: SR, low voltage throughout, anteroseptal infarct, PVCs Assessment/Plan Ms. Oriordan is a 64 yo woman with PMH of ischemic cardiomyopathy and chronic systolic heart failure with last known EF ~ 15-20%, CABG 08/2011, morbid obesity, pulmonary hypertension (PA pressure 72 mmHg), poorly controlled T2DM who presents with weight gain and dyspnea and signs/symptoms consistent with acute on chronic systolic heart failure. Will start with 100 mg IV lasix bolus + lasix gtt at 15 mg/hr (previously required 30 mg/hr with addition of metolazone and diamox). Hold losartan given active diuresis for now.  1. Acute on chronic systolic heart failure 2. Ischemic cardiomyopathy with recent coronary angiogram 3. Pulmonary hypertension 4. T2DM  Plan 1. Admit to telemetry, trend biomarkers 2. Diuresis with 100 mg IV lasix x1, 15 mg/hr gtt 3. Continue asa 81 mg and atorva 20 mg and spiro 12.5 mg daily, coreg 3.125 mg bid 3. Continue glipizide and janumet for diabetes given no planned studies (for now) 4. Watch cbgs 5. Anticipate 3+ days for active diuresis (last dry weight ~ 234-237 lbs)  Christine Husbands, MD 04/11/2016, 9:40 PM

## 2016-04-11 NOTE — ED Provider Notes (Signed)
CSN: [id number]     Arrival date & time 04/11/16  1851 History   First MD Initiated Contact with Patient 04/11/16 1906     Chief Complaint  Patient presents with  . Shortness of Breath  . Chest Pain     (Consider location/radiation/quality/duration/timing/severity/associated sxs/prior Treatment) HPI  Pt presenting with c/o shortness of breath.  Pt has hx of ICM, CHF, HTN, DM. She states that she has been more short of breath for the past week- has gained over 20 pounds in the past month.  She also has been having bilateral increased lower extremity edema.  States she has seen clear fluid weeping from both her lower legs.  She feels more short of breath with walking or movement.  No fever/chills, no cough.  Denies chest pain.  There are no other associated systemic symptoms, there are no other alleviating or modifying factors.   Past Medical History  Diagnosis Date  . Obesity, morbid ([place])   . Chronic systolic heart failure (HCC)     Chronic systolic CHF  . Hyperlipidemia 08/10/11  . Diabetes mellitus type 2 in obese ([place]) 08/10/11  . CAD (coronary artery disease) 08/2011    NSTEMI with subsequent CABG 08/13/2011 with LIMA-LAD, SVG-diagonal, SVG-OM, SVG-PDA  . Ischemic cardiomyopathy 08/13/2011    EF 20% in 08/2011, still 25% 10/21/2011  . Pleural effusion     Requiring L thoracentesis 09/02/11  . Mitral regurgitation     Mild by echo 10/21/11  . Hypertension   . Pulmonary hypertension ([place])     Echo, September, 2013, 72 mmHg.  Marland Kitchen Ejection fraction < 50%     EF 20%, October, 2012  //  EF 25% December, 2012  //   EF 35%, echo, September, 2013  . Myocardial infarction ([place]) 08/2011  . Orthopnoea     "progressively worse over last 3 wks" (03/17/2013)  . CHF (congestive heart failure) ([place])   . Carpal tunnel syndrome on both sides     "post OHS in 08/2011; resolved now" (03/17/2013)   Past Surgical History  Procedure Laterality Date  . Laparoscopy  1980's?    "removed gallstones; not  gallbladder" (03/17/2013)  . Coronary artery bypass graft  08/13/11    CABG x4 with LIMA to LAD, SVG to Diag, SVG to OM, SVG to PDA, EVH via both thighs  . Cardiac catheterization N/A 12/13/2015    Procedure: Right/Left Heart Cath and Coronary/Graft Angiography;  Surgeon: Peter M Martinique, MD;  Location: [facility];  Service: Cardiovascular;  Laterality: N/A;   Family History  Problem Relation Age of Onset  . Heart disease Father   . Cancer Neg Hx   . Diabetes Neg Hx   . Kidney disease Neg Hx   . Hypertension Maternal Grandmother   . Hypertension Sister    Social History  Substance Use Topics  . Smoking status: Former Smoker    Types: Cigarettes    Quit date: 11/11/1978  . Smokeless tobacco: Never Used     Comment: 03/17/2013 "only a social smoker when I did smoke; ever bought any"  . Alcohol Use: Yes     Comment: 03/17/2013 "in the last 6 months I've had 1 glass of wine"   OB History    No data available     Review of Systems  ROS reviewed and all otherwise negative except for mentioned in HPI    Allergies  Lisinopril and Metformin and related  Home Medications   Prior to Admission medications  Medication Sig Start Date End Date Taking? Authorizing Provider  acetaminophen (TYLENOL) 500 MG tablet Take 1,000 mg by mouth every 12 (twelve) hours as needed for mild pain or moderate pain.   Yes Historical Provider, MD  aspirin 81 MG tablet Take 1 tablet (81 mg total) by mouth daily. 11/15/13  Yes Carlena Bjornstad, MD  atorvastatin (LIPITOR) 10 MG tablet Take 1 tablet (10 mg total) by mouth daily. 12/28/15  Yes Shirley Friar, PA-C  Blood Glucose Monitoring Suppl (ONETOUCH VERIO) W/DEVICE KIT 1 Act by Does not apply route 3 (three) times daily. 10/12/14  Yes Janith Lima, MD  carvedilol (COREG) 3.125 MG tablet Take 1 tablet (3.125 mg total) by mouth 2 (two) times daily with a meal. 12/28/15  Yes Shirley Friar, PA-C  dextromethorphan-guaiFENesin [facility] DM) 30-600 MG  12hr tablet Take 1 tablet by mouth daily as needed for cough.   Yes Historical Provider, MD  glipiZIDE (GLUCOTROL) 10 MG tablet Take 10 mg by mouth 2 (two) times daily. 02/20/15  Yes Historical Provider, MD  glucose blood test strip Use TID 10/12/14  Yes Janith Lima, MD  losartan (COZAAR) 25 MG tablet Take 0.5 tablets (12.5 mg total) by mouth daily. 01/18/16  Yes Shirley Friar, PA-C  Multiple Vitamins-Minerals (CENTRUM SILVER 50+WOMEN PO) Take 1 tablet by mouth every morning.   Yes Historical Provider, MD  potassium chloride SA (K-DUR,KLOR-CON) 20 MEQ tablet Take 1 tablet (20 mEq total) by mouth daily. 12/28/15  Yes Shirley Friar, PA-C  spironolactone (ALDACTONE) 25 MG tablet Take 0.5 tablets (12.5 mg total) by mouth daily. 12/28/15  Yes Shirley Friar, PA-C  torsemide (DEMADEX) 20 MG tablet Take 2 tablets (40 mg total) by mouth 2 (two) times daily. 12/28/15  Yes Shirley Friar, PA-C  sitaGLIPtin-metformin (JANUMET) 50-1000 MG tablet Take 1 tablet by mouth 2 (two) times daily with a meal. 01/04/16   Willeen Niece, MD   BP 115/66 mmHg  Pulse 72  Temp(Src) 98.7 F (37.1 C) (Oral)  Resp 20  Ht 5' 6" (1.676 m)  Wt 124.558 kg  BMI 44.34 kg/m2  SpO2 91%  Vitals reviewed Physical Exam  Physical Examination: General appearance - alert, well appearing, and in no distress Mental status - alert, oriented to person, place, and time Eyes - no conjunctival injection no scleral icterus Chest - BSS, decreased breath sounds throughout, increased respiratory effort Heart - normal rate, regular rhythm, normal S1, S2, no murmurs, rubs, clicks or gallops Abdomen - soft, nontender, nondistended, no masses or organomegaly Neurological - alert, oriented, normal speech Extremities - peripheral pulses normal, 2-3+ pedal edema with venous stasis changes bilaterally, no clubbing or cyanosis Skin - normal coloration and turgor, no rashes  ED Course  Procedures (including critical care  time) Labs Review Labs Reviewed  BASIC METABOLIC PANEL - Abnormal; Notable for the following:    Glucose, Bld 110 (*)    BUN 27 (*)    Creatinine, Ser 1.49 (*)    GFR calc non Af Amer 36 (*)    GFR calc Af Amer 42 (*)    All other components within normal limits  CBC - Abnormal; Notable for the following:    RDW 17.6 (*)    All other components within normal limits  BRAIN NATRIURETIC PEPTIDE - Abnormal; Notable for the following:    B Natriuretic Peptide 1176.5 (*)    All other components within normal limits  Randolm Idol, ED    Imaging  Review Dg Chest 2 View  04/11/2016  CLINICAL DATA:  Shortness of breath for several weeks, worsened over the past 3 days. 30 pound weight gain over the past month. EXAM: CHEST  2 VIEW COMPARISON:  Single view of the chest 12/08/2015. PA and lateral chest 03/13/2015. FINDINGS: There is cardiomegaly. The patient is status post CABG. Bibasilar airspace disease appears worse on the left. There are likely bilateral pleural effusions. Mild interstitial edema is seen. IMPRESSION: Cardiomegaly and mild interstitial edema. Likely bilateral pleural effusions and basilar airspace disease which could be atelectasis or pneumonia. Electronically Signed   By: Inge Rise M.D.   On: 04/11/2016 20:11   I have personally reviewed and evaluated these images and lab results as part of my medical decision-making.   EKG Interpretation   Date/Time:  Thursday April 11 2016 19:04:11 EDT Ventricular Rate:  72 PR Interval:    QRS Duration: 98 QT Interval:  400 QTC Calculation: 438 R Axis:   143 Text Interpretation:  sinus rhythm wtih PVCs Right axis deviation Low  voltage QRS Septal infarct , age undetermined Abnormal ECG Since previous  tracing QRS is more low voltage Confirmed by Canary Brim  MD, MARTHA [phone]) on  04/11/2016 7:32:32 PM      MDM   Final diagnoses:  Acute on chronic systolic congestive heart failure (HCC)    Pt presenting with co increasing  shortness of breath, increased weight gain, and lower extremity edema.  Most c/w heart failure exacerbation.  Pt started on IV lasix in the ED.  Doubt pneumonia- no elevation in WBC.  Pt admitted to cardiology service.   9:24 PM d/w Dr. Claiborne Billings, cardiology- he will admit patient to cardiology service.    Alfonzo Beers, MD 04/12/16 1640

## 2016-04-11 NOTE — ED Notes (Signed)
Cardiology at bedside.

## 2016-04-12 ENCOUNTER — Other Ambulatory Visit: Payer: Self-pay

## 2016-04-12 DIAGNOSIS — I5023 Acute on chronic systolic (congestive) heart failure: Secondary | ICD-10-CM

## 2016-04-12 LAB — COMPREHENSIVE METABOLIC PANEL
ALBUMIN: 3.5 g/dL (ref 3.5–5.0)
ALT: 19 U/L (ref 14–54)
AST: 32 U/L (ref 15–41)
Alkaline Phosphatase: 114 U/L (ref 38–126)
Anion gap: 7 (ref 5–15)
BUN: 27 mg/dL — AB (ref 6–20)
CHLORIDE: 101 mmol/L (ref 101–111)
CO2: 29 mmol/L (ref 22–32)
Calcium: 9.4 mg/dL (ref 8.9–10.3)
Creatinine, Ser: 1.38 mg/dL — ABNORMAL HIGH (ref 0.44–1.00)
GFR calc Af Amer: 46 mL/min — ABNORMAL LOW (ref >60)
GFR calc non Af Amer: 39 mL/min — ABNORMAL LOW (ref >60)
GLUCOSE: 109 mg/dL — AB (ref 65–99)
POTASSIUM: 4.1 mmol/L (ref 3.5–5.1)
SODIUM: 137 mmol/L (ref 135–145)
Total Bilirubin: 1.9 mg/dL — ABNORMAL HIGH (ref 0.3–1.2)
Total Protein: 7.9 g/dL (ref 6.5–8.1)

## 2016-04-12 LAB — PROTIME-INR
INR: 1.48 (ref 0.00–1.49)
Prothrombin Time: 18 seconds — ABNORMAL HIGH (ref 11.6–15.2)

## 2016-04-12 LAB — CBC
HCT: 40.6 % (ref 36.0–46.0)
Hemoglobin: 12.8 g/dL (ref 12.0–15.0)
MCH: 29.9 pg (ref 26.0–34.0)
MCHC: 31.5 g/dL (ref 30.0–36.0)
MCV: 94.9 fL (ref 78.0–100.0)
PLATELETS: 217 10*3/uL (ref 150–400)
RBC: 4.28 MIL/uL (ref 3.87–5.11)
RDW: 17.6 % — AB (ref 11.5–15.5)
WBC: 4.5 10*3/uL (ref 4.0–10.5)

## 2016-04-12 LAB — TROPONIN I
TROPONIN I: 0.05 ng/mL — AB (ref <0.031)
TROPONIN I: 0.05 ng/mL — AB (ref <0.031)
Troponin I: 0.05 ng/mL — ABNORMAL HIGH (ref <0.031)

## 2016-04-12 LAB — GLUCOSE, CAPILLARY
Glucose-Capillary: 86 mg/dL (ref 65–99)
Glucose-Capillary: 94 mg/dL (ref 65–99)
Glucose-Capillary: 108 mg/dL — ABNORMAL HIGH (ref 65–99)
Glucose-Capillary: 63 mg/dL — ABNORMAL LOW (ref 65–99)

## 2016-04-12 LAB — TSH: TSH: 4.607 u[IU]/mL — AB (ref 0.350–4.500)

## 2016-04-12 LAB — MAGNESIUM: Magnesium: 2.2 mg/dL (ref 1.7–2.4)

## 2016-04-12 MED ORDER — SODIUM CHLORIDE 0.9% FLUSH: 3.0000 mL | INTRAVENOUS | Status: DC | PRN

## 2016-04-12 MED ORDER — ENOXAPARIN SODIUM 40 MG/0.4ML ~~LOC~~ SOLN
40.0000 mg | SUBCUTANEOUS | Status: DC
  Administered 2016-04-14 – 2016-04-23 (×3): 40 mg via SUBCUTANEOUS
  Filled 2016-04-12 (×11): qty 0.4

## 2016-04-12 MED ORDER — SODIUM CHLORIDE 0.9 % IV SOLN
250.0000 mL | INTRAVENOUS | Status: DC | PRN
  Administered 2016-04-16: 250 mL via INTRAVENOUS

## 2016-04-12 MED ORDER — SODIUM CHLORIDE 0.9% FLUSH
3.0000 mL | Freq: Two times a day (BID) | INTRAVENOUS | Status: DC
  Administered 2016-04-12 – 2016-04-25 (×15): 3 mL via INTRAVENOUS

## 2016-04-12 MED ORDER — ACETAMINOPHEN 325 MG PO TABS
650.0000 mg | ORAL_TABLET | ORAL | Status: DC | PRN
  Administered 2016-04-17 – 2016-04-26 (×16): 650 mg via ORAL
  Filled 2016-04-12 (×16): qty 2

## 2016-04-12 MED ORDER — POTASSIUM CHLORIDE CRYS ER 20 MEQ PO TBCR
20.0000 meq | EXTENDED_RELEASE_TABLET | Freq: Two times a day (BID) | ORAL | Status: DC
  Administered 2016-04-12 – 2016-04-14 (×6): 20 meq via ORAL
  Filled 2016-04-12 (×7): qty 1

## 2016-04-12 MED ORDER — SPIRONOLACTONE 25 MG PO TABS
12.5000 mg | ORAL_TABLET | Freq: Every day | ORAL | Status: DC
  Administered 2016-04-12 – 2016-04-14 (×3): 12.5 mg via ORAL
  Filled 2016-04-12 (×3): qty 1

## 2016-04-12 MED ORDER — ONDANSETRON HCL 4 MG/2ML IJ SOLN: 4.0000 mg | Freq: Four times a day (QID) | INTRAMUSCULAR | Status: DC | PRN

## 2016-04-12 MED ORDER — ADULT MULTIVITAMIN W/MINERALS CH
ORAL_TABLET | Freq: Every morning | ORAL | Status: DC
  Administered 2016-04-12 – 2016-04-26 (×15): 1 via ORAL
  Filled 2016-04-12 (×15): qty 1

## 2016-04-12 MED ORDER — GLIPIZIDE 10 MG PO TABS
10.0000 mg | ORAL_TABLET | Freq: Two times a day (BID) | ORAL | Status: DC
  Administered 2016-04-12 – 2016-04-19 (×12): 10 mg via ORAL
  Filled 2016-04-12 (×16): qty 1

## 2016-04-12 MED ORDER — CARVEDILOL 3.125 MG PO TABS
3.1250 mg | ORAL_TABLET | Freq: Two times a day (BID) | ORAL | Status: DC
  Administered 2016-04-12 (×2): 3.125 mg via ORAL
  Filled 2016-04-12 (×2): qty 1

## 2016-04-12 MED ORDER — CARVEDILOL 3.125 MG PO TABS
3.1250 mg | ORAL_TABLET | Freq: Two times a day (BID) | ORAL | Status: DC
  Administered 2016-04-13 – 2016-04-14 (×2): 3.125 mg via ORAL
  Filled 2016-04-12 (×3): qty 1

## 2016-04-12 MED ORDER — DM-GUAIFENESIN ER 30-600 MG PO TB12
1.0000 | ORAL_TABLET | Freq: Every day | ORAL | Status: DC | PRN
  Administered 2016-04-13 – 2016-04-17 (×7): 1 via ORAL
  Filled 2016-04-12 (×7): qty 1

## 2016-04-12 MED ORDER — METOLAZONE 5 MG PO TABS
5.0000 mg | ORAL_TABLET | Freq: Every day | ORAL | Status: DC
  Administered 2016-04-12 – 2016-04-14 (×3): 5 mg via ORAL
  Filled 2016-04-12: qty 2
  Filled 2016-04-12 (×2): qty 1

## 2016-04-12 MED ORDER — ASPIRIN EC 81 MG PO TBEC: 81.0000 mg | DELAYED_RELEASE_TABLET | Freq: Every day | ORAL | Status: DC

## 2016-04-12 MED ORDER — ASPIRIN EC 81 MG PO TBEC
81.0000 mg | DELAYED_RELEASE_TABLET | Freq: Every day | ORAL | Status: DC
  Administered 2016-04-12 – 2016-04-26 (×15): 81 mg via ORAL
  Filled 2016-04-12 (×15): qty 1

## 2016-04-12 MED ORDER — FUROSEMIDE 10 MG/ML IJ SOLN
30.0000 mg/h | INTRAVENOUS | Status: DC
  Administered 2016-04-12: 15 mg/h via INTRAVENOUS
  Administered 2016-04-12 – 2016-04-14 (×5): 30 mg/h via INTRAVENOUS
  Filled 2016-04-12 (×20): qty 25

## 2016-04-12 MED ORDER — FUROSEMIDE 10 MG/ML IJ SOLN
100.0000 mg | Freq: Once | INTRAVENOUS | Status: AC
  Administered 2016-04-12: 100 mg via INTRAVENOUS
  Filled 2016-04-12: qty 10

## 2016-04-12 MED ORDER — LOSARTAN POTASSIUM 25 MG PO TABS
12.5000 mg | ORAL_TABLET | Freq: Every day | ORAL | Status: DC
  Administered 2016-04-12 – 2016-04-13 (×2): 12.5 mg via ORAL
  Filled 2016-04-12 (×3): qty 1

## 2016-04-12 MED ORDER — ATORVASTATIN CALCIUM 40 MG PO TABS
40.0000 mg | ORAL_TABLET | Freq: Every day | ORAL | Status: DC
  Administered 2016-04-12 – 2016-04-26 (×15): 40 mg via ORAL
  Filled 2016-04-12 (×15): qty 1

## 2016-04-12 NOTE — Progress Notes (Signed)
Advanced Heart Failure Rounding Note   Subjective:    Admitted with marked volume overload. Started lasix drip. Weight down 1 pound.  Remains SOB at rest.     Objective:   Weight Range:  Vital Signs:   Temp:  [97.4 F (36.3 C)-98.7 F (37.1 C)] 97.4 F (36.3 C) (06/02 0519) Pulse Rate:  [60-90] 79 (06/02 1022) Resp:  [15-28] 22 (06/02 0519) BP: (102-137)/(63-96) 117/95 mmHg (06/02 1022) SpO2:  [91 %-100 %] 99 % (06/02 0519) Weight:  [272 lb 11.2 oz (123.696 kg)-274 lb 9.6 oz (124.558 kg)] 272 lb 11.2 oz (123.696 kg) (06/02 0519) Last BM Date: 04/11/16  Weight change: Filed Weights   04/11/16 1905 04/12/16 0025 04/12/16 0519  Weight: 274 lb 9.6 oz (124.558 kg) 273 lb 4.8 oz (123.968 kg) 272 lb 11.2 oz (123.696 kg)    Intake/Output:   Intake/Output Summary (Last 24 hours) at 04/12/16 1144 Last data filed at 04/12/16 0827  Gross per 24 hour  Intake    480 ml  Output    900 ml  Net   -420 ml     Physical Exam: General:  Dyspneic at rest. Sitting in the chair.  HEENT: normal Neck: supple. JVP to ear. . Carotids 2+ bilat; no bruits. No lymphadenopathy or thryomegaly appreciated. Cor: PMI nondisplaced. Regular rate & rhythm. No rubs, gallops or murmurs. Lungs: clear Abdomen: soft, nontender, nondistended. No hepatosplenomegaly. No bruits or masses. Good bowel sounds. Extremities: no cyanosis, clubbing, rash, edema. R and LLE 3+ edema.  Neuro: alert & orientedx3, cranial nerves grossly intact. moves all 4 extremities w/o difficulty. Affect pleasant  Telemetry: SR PVCs 70s   Labs: Basic Metabolic Panel:  Recent Labs Lab 04/11/16 1929 04/12/16 0200  NA 137 137  K 4.3 4.1  CL 101 101  CO2 29 29  GLUCOSE 110* 109*  BUN 27* 27*  CREATININE 1.49* 1.38*  CALCIUM 9.5 9.4  MG  --  2.2    Liver Function Tests:  Recent Labs Lab 04/12/16 0200  AST 32  ALT 19  ALKPHOS 114  BILITOT 1.9*  PROT 7.9  ALBUMIN 3.5   No results for input(s): LIPASE,  AMYLASE in the last 168 hours. No results for input(s): AMMONIA in the last 168 hours.  CBC:  Recent Labs Lab 04/11/16 1929 04/12/16 0200  WBC 4.7 4.5  HGB 13.8 12.8  HCT 43.1 40.6  MCV 96.2 94.9  PLT 218 217    Cardiac Enzymes:  Recent Labs Lab 04/12/16 0200 04/12/16 0634  TROPONINI 0.05* 0.05*    BNP: BNP (last 3 results)  Recent Labs  12/08/15 0922 01/04/16 1139 04/11/16 1929  BNP 1155.5* 513.3* 1176.5*    ProBNP (last 3 results) No results for input(s): PROBNP in the last 8760 hours.    Other results:  Imaging: Dg Chest 2 View  04/11/2016  CLINICAL DATA:  Shortness of breath for several weeks, worsened over the past 3 days. 30 pound weight gain over the past month. EXAM: CHEST  2 VIEW COMPARISON:  Single view of the chest 12/08/2015. PA and lateral chest 03/13/2015. FINDINGS: There is cardiomegaly. The patient is status post CABG. Bibasilar airspace disease appears worse on the left. There are likely bilateral pleural effusions. Mild interstitial edema is seen. IMPRESSION: Cardiomegaly and mild interstitial edema. Likely bilateral pleural effusions and basilar airspace disease which could be atelectasis or pneumonia. Electronically Signed   By: Drusilla Kanner M.D.   On: 04/11/2016 20:11  Medications:     Scheduled Medications: . aspirin EC  81 mg Oral Daily  . atorvastatin  40 mg Oral Daily  . carvedilol  3.125 mg Oral BID WC  . enoxaparin (LOVENOX) injection  40 mg Subcutaneous Q24H  . glipiZIDE  10 mg Oral BID AC  . multivitamin with minerals   Oral q morning - 10a  . potassium chloride SA  20 mEq Oral BID  . sodium chloride flush  3 mL Intravenous Q12H  . spironolactone  12.5 mg Oral Daily     Infusions: . furosemide (LASIX) infusion 15 mg/hr (04/12/16 0256)     PRN Medications:  sodium chloride, acetaminophen, dextromethorphan-guaiFENesin, ondansetron (ZOFRAN) IV, sodium chloride flush   Assessment/Plan/Discussion   Ms.  Christine Knight is a 65 yo woman with PMH of ischemic cardiomyopathy and chronic systolic heart failure with last known EF ~ 15-20%, CABG 08/2011, morbid obesity, pulmonary hypertension (PA pressure 72 mmHg), poorly controlled T2DM.    1. Acute on chronic systolic heart failure  NYHA IV. Sluggish diuresis with lasix drip at 15 mg /hour. Previously required 30 mg/hr with addition of metolazone and diamox. Increase lasix drip to 30 mg per hour and add metolazone 5 mg daily. Once diuresed, anticipate switching to torsemide 80 mg twice a day. Prior to admit she was on torsemide 40 mg twice a day. Follow renal function closely.  Continue low dose coreg.  Hold losartan given active diuresis for now.  2. Ischemic cardiomyopathy with recent coronary angiogram 3. Pulmonary hypertension 4. T2DM  Length of Stay: 1  [name] NP-C  04/12/2016, 11:44 AM  Advanced Heart Failure Team Pager [phone] (M-F; 7a - 4p)  Please contact [facility] for night-coverage after hours (4p -7a ) and weekends on [email or web address]  Patient seen with NP, agree with the above note.  She is markedly volume overloaded.  Had been mostly in bed for a month due to "arthritis"and noted marked dyspnea when she started walking around again.   - Increase Lasix to 30 mg/hr and add metolazone, this is the regimen used in the past.  Her creatinine is fairly stable. She needs a lot of fluid off.  - Can continue low dose Coreg and losartan 12.5 mg daily.   Marca AnconaDalton Jerome Viglione 04/12/2016 12:09 PM

## 2016-04-13 ENCOUNTER — Inpatient Hospital Stay (HOSPITAL_COMMUNITY): Payer: Medicaid Other

## 2016-04-13 DIAGNOSIS — E1140 Type 2 diabetes mellitus with diabetic neuropathy, unspecified: Secondary | ICD-10-CM

## 2016-04-13 DIAGNOSIS — I255 Ischemic cardiomyopathy: Secondary | ICD-10-CM

## 2016-04-13 DIAGNOSIS — E1165 Type 2 diabetes mellitus with hyperglycemia: Secondary | ICD-10-CM

## 2016-04-13 LAB — GLUCOSE, CAPILLARY
GLUCOSE-CAPILLARY: 71 mg/dL (ref 65–99)
GLUCOSE-CAPILLARY: 92 mg/dL (ref 65–99)
GLUCOSE-CAPILLARY: 73 mg/dL (ref 65–99)
Glucose-Capillary: 101 mg/dL — ABNORMAL HIGH (ref 65–99)
Glucose-Capillary: 75 mg/dL (ref 65–99)

## 2016-04-13 LAB — BASIC METABOLIC PANEL
ANION GAP: 9 (ref 5–15)
BUN: 31 mg/dL — ABNORMAL HIGH (ref 6–20)
CALCIUM: 9.1 mg/dL (ref 8.9–10.3)
CO2: 27 mmol/L (ref 22–32)
Chloride: 97 mmol/L — ABNORMAL LOW (ref 101–111)
Creatinine, Ser: 1.46 mg/dL — ABNORMAL HIGH (ref 0.44–1.00)
GFR, EST AFRICAN AMERICAN: 43 mL/min — AB (ref >60)
GFR, EST NON AFRICAN AMERICAN: 37 mL/min — AB (ref >60)
GLUCOSE: 86 mg/dL (ref 65–99)
POTASSIUM: 5.1 mmol/L (ref 3.5–5.1)
Sodium: 133 mmol/L — ABNORMAL LOW (ref 135–145)

## 2016-04-13 MED ORDER — MILRINONE LACTATE IN DEXTROSE 20-5 MG/100ML-% IV SOLN
0.1250 ug/kg/min | INTRAVENOUS | Status: DC
  Administered 2016-04-13 – 2016-04-17 (×8): 0.25 ug/kg/min via INTRAVENOUS
  Administered 2016-04-17 – 2016-04-21 (×5): 0.125 ug/kg/min via INTRAVENOUS
  Filled 2016-04-13 (×14): qty 100

## 2016-04-13 MED ORDER — SODIUM CHLORIDE 0.9% FLUSH
10.0000 mL | INTRAVENOUS | Status: DC | PRN
Start: 2016-04-13 — End: 2016-04-26

## 2016-04-13 MED ORDER — SODIUM CHLORIDE 0.9% FLUSH
10.0000 mL | Freq: Two times a day (BID) | INTRAVENOUS | Status: DC
  Administered 2016-04-13 – 2016-04-25 (×8): 10 mL
  Administered 2016-04-25: 20 mL

## 2016-04-13 NOTE — Progress Notes (Signed)
Peripherally Inserted Central Catheter/Midline Placement  The IV Nurse has discussed with the patient and/or persons authorized to consent for the patient, the purpose of this procedure and the potential benefits and risks involved with this procedure.  The benefits include less needle sticks, lab draws from the catheter and patient may be discharged home with the catheter.  Risks include, but not limited to, infection, bleeding, blood clot (thrombus formation), and puncture of an artery; nerve damage and irregular heat beat.  Alternatives to this procedure were also discussed.  PICC/Midline Placement Documentation  PICC Double Lumen 04/13/16 PICC Right Brachial 44 cm 0 cm (Active)  Indication for Insertion or Continuance of Line Vasoactive infusions 04/13/2016  2:21 PM  Exposed Catheter (cm) 0 cm 04/13/2016  2:21 PM  Site Assessment Clean;Dry;Intact 04/13/2016  2:21 PM  Lumen #1 Status Flushed;Saline locked;Blood return noted 04/13/2016  2:21 PM  Lumen #2 Status Flushed;Saline locked;Blood return noted 04/13/2016  2:21 PM  Dressing Type Transparent 04/13/2016  2:21 PM  Dressing Status Clean;Dry;Intact 04/13/2016  2:21 PM  Dressing Change Due 04/20/16 04/13/2016  2:21 PM       Ethelda Chickurrie, Presleigh Feldstein Robert 04/13/2016, 2:22 PM

## 2016-04-13 NOTE — Progress Notes (Signed)
Patient Name: Lum BabeBarbara Marzo Date of Encounter: 04/13/2016  Principal Problem:   Acute on chronic systolic heart failure, NYHA class 3 (HCC) Active Problems:   Obesity, morbid-BMI 45   Hyperlipidemia LDL goal <70   Type 2 diabetes, uncontrolled, with neuropathy (HCC)   Ischemic cardiomyopathy-30-35% May 2016   Acute on chronic systolic and diastolic heart failure, NYHA class 4 (HCC)   Length of Stay: 2  SUBJECTIVE  No progress in last 24 h. In>out by 700 mL and weight unchanged. Good diuresis, but not nearly as much as expected for the diuretic dose.  CURRENT MEDS . aspirin EC  81 mg Oral Daily  . atorvastatin  40 mg Oral Daily  . carvedilol  3.125 mg Oral BID WC  . enoxaparin (LOVENOX) injection  40 mg Subcutaneous Q24H  . glipiZIDE  10 mg Oral BID AC  . losartan  12.5 mg Oral Daily  . metolazone  5 mg Oral Daily  . multivitamin with minerals   Oral q morning - 10a  . potassium chloride SA  20 mEq Oral BID  . sodium chloride flush  3 mL Intravenous Q12H  . spironolactone  12.5 mg Oral Daily    OBJECTIVE   Intake/Output Summary (Last 24 hours) at 04/13/16 1145 Last data filed at 04/13/16 1000  Gross per 24 hour  Intake 2930.5 ml  Output   2450 ml  Net  480.5 ml   Filed Weights   04/12/16 0025 04/12/16 0519 04/13/16 0627  Weight: 123.968 kg (273 lb 4.8 oz) 123.696 kg (272 lb 11.2 oz) 123.651 kg (272 lb 9.6 oz)    PHYSICAL EXAM Filed Vitals:   04/13/16 0627 04/13/16 0800 04/13/16 0813 04/13/16 0900  BP:  110/55 110/55 95/45  Pulse:  62 62 66  Temp:  98.4 F (36.9 C)    TempSrc:  Oral    Resp:      Height:      Weight: 123.651 kg (272 lb 9.6 oz)     SpO2:  100%     General: Alert, oriented x3, no distress Head: no evidence of trauma, PERRL, EOMI, no exophtalmos or lid lag, no myxedema, no xanthelasma; normal ears, nose and oropharynx Neck: elevated jugular venous pulsations and no hepatojugular reflux; brisk carotid pulses without delay and no carotid  bruits Chest: clear to auscultation, no signs of consolidation by percussion or palpation, normal fremitus, symmetrical and full respiratory excursions Cardiovascular: normal position and quality of the apical impulse, regular rhythm, normal first and louder second heart sounds, no rubs, S3 gallop, no murmur Abdomen: no tenderness or distention, no masses by palpation, no abnormal pulsatility or arterial bruits, normal bowel sounds, no hepatosplenomegaly Extremities: no clubbing, cyanosis or edema; 2+ radial, ulnar and brachial pulses bilaterally; 2+ right femoral, posterior tibial and dorsalis pedis pulses; 2+ left femoral, posterior tibial and dorsalis pedis pulses; no subclavian or femoral bruits Neurological: grossly nonfocal  LABS  CBC  Recent Labs  04/11/16 1929 04/12/16 0200  WBC 4.7 4.5  HGB 13.8 12.8  HCT 43.1 40.6  MCV 96.2 94.9  PLT 218 217   Basic Metabolic Panel  Recent Labs  04/12/16 0200 04/13/16 0248  NA 137 133*  K 4.1 5.1  CL 101 97*  CO2 29 27  GLUCOSE 109* 86  BUN 27* 31*  CREATININE 1.38* 1.46*  CALCIUM 9.4 9.1  MG 2.2  --    Liver Function Tests  Recent Labs  04/12/16 0200  AST 32  ALT 19  ALKPHOS  114  BILITOT 1.9*  PROT 7.9  ALBUMIN 3.5   No results for input(s): LIPASE, AMYLASE in the last 72 hours. Cardiac Enzymes  Recent Labs  04/12/16 0200 04/12/16 0634 04/12/16 1252  TROPONINI 0.05* 0.05* 0.05*     Recent Labs  04/12/16 0200  TSH 4.607*    Radiology Studies Imaging results have been reviewed and Dg Chest 2 View  04/11/2016  CLINICAL DATA:  Shortness of breath for several weeks, worsened over the past 3 days. 30 pound weight gain over the past month. EXAM: CHEST  2 VIEW COMPARISON:  Single view of the chest 12/08/2015. PA and lateral chest 03/13/2015. FINDINGS: There is cardiomegaly. The patient is status post CABG. Bibasilar airspace disease appears worse on the left. There are likely bilateral pleural effusions. Mild  interstitial edema is seen. IMPRESSION: Cardiomegaly and mild interstitial edema. Likely bilateral pleural effusions and basilar airspace disease which could be atelectasis or pneumonia. Electronically Signed   By: Drusilla Kanner M.D.   On: 04/11/2016 20:11    TELE NSR    ASSESSMENT AND PLAN  Ms. Doland is a 65 yo woman with PMH of ischemic cardiomyopathy and chronic systolic heart failure with last known EF ~ 15-20%, CABG 08/2011, morbid obesity, pulmonary hypertension (PA pressure 72 mmHg), poorly controlled T2DM. Estimated dry weight 235 lb, 40 lb over now.  1. Acute on chronic systolic heart failure NYHA IV. Fair diuresis with lasix drip at  30 mg /hour with addition of metolazone, but in positive fluid balance. Add Milrinone 0.25 mcg/kg/h, place PICC. Once diuresed, anticipate switching to torsemide 80 mg twice a day. Prior to admit she was on torsemide 40 mg twice a day. Follow renal function closely.  May have to temporarily stop low dose coreg.  Hold losartan given active diuresis for now.  Repeat echo 2. Ischemic cardiomyopathy with recent coronary angiogram, occluded native vessels, but patent grafts 3. Pulmonary hypertension, severe 4. T2DM   Thurmon Fair, MD, [facility] [phone] office [phone] pager 04/13/2016 11:45 AM

## 2016-04-14 ENCOUNTER — Inpatient Hospital Stay (HOSPITAL_COMMUNITY): Payer: Medicaid Other

## 2016-04-14 DIAGNOSIS — R0600 Dyspnea, unspecified: Secondary | ICD-10-CM

## 2016-04-14 DIAGNOSIS — N183 Chronic kidney disease, stage 3 (moderate): Secondary | ICD-10-CM

## 2016-04-14 LAB — ECHOCARDIOGRAM COMPLETE
Height: 66 in
Weight: 4361.6 oz

## 2016-04-14 LAB — BASIC METABOLIC PANEL
ANION GAP: 7 (ref 5–15)
BUN: 36 mg/dL — AB (ref 6–20)
CHLORIDE: 93 mmol/L — AB (ref 101–111)
CO2: 32 mmol/L (ref 22–32)
Calcium: 9.2 mg/dL (ref 8.9–10.3)
Creatinine, Ser: 1.67 mg/dL — ABNORMAL HIGH (ref 0.44–1.00)
GFR, EST AFRICAN AMERICAN: 36 mL/min — AB (ref >60)
GFR, EST NON AFRICAN AMERICAN: 31 mL/min — AB (ref >60)
Glucose, Bld: 100 mg/dL — ABNORMAL HIGH (ref 65–99)
POTASSIUM: 4.1 mmol/L (ref 3.5–5.1)
SODIUM: 132 mmol/L — AB (ref 135–145)

## 2016-04-14 LAB — GLUCOSE, CAPILLARY
GLUCOSE-CAPILLARY: 125 mg/dL — AB (ref 65–99)
Glucose-Capillary: 156 mg/dL — ABNORMAL HIGH (ref 65–99)
Glucose-Capillary: 186 mg/dL — ABNORMAL HIGH (ref 65–99)
Glucose-Capillary: 217 mg/dL — ABNORMAL HIGH (ref 65–99)

## 2016-04-14 LAB — CARBOXYHEMOGLOBIN
CARBOXYHEMOGLOBIN: 2 % — AB (ref 0.5–1.5)
Methemoglobin: 0.7 % (ref 0.0–1.5)
O2 SAT: 61.2 %
Total hemoglobin: 11.9 g/dL — ABNORMAL LOW (ref 12.0–16.0)

## 2016-04-14 MED ORDER — METOLAZONE 5 MG PO TABS
5.0000 mg | ORAL_TABLET | Freq: Two times a day (BID) | ORAL | Status: DC
  Administered 2016-04-14 – 2016-04-16 (×5): 5 mg via ORAL
  Filled 2016-04-14 (×5): qty 1

## 2016-04-14 MED ORDER — SPIRONOLACTONE 25 MG PO TABS: 25.0000 mg | ORAL_TABLET | Freq: Every day | ORAL | Status: DC

## 2016-04-14 MED ORDER — CARVEDILOL 3.125 MG PO TABS
3.1250 mg | ORAL_TABLET | Freq: Two times a day (BID) | ORAL | Status: DC
  Administered 2016-04-14 – 2016-04-22 (×13): 3.125 mg via ORAL
  Filled 2016-04-14 (×19): qty 1

## 2016-04-14 MED ORDER — ACETAZOLAMIDE 250 MG PO TABS
250.0000 mg | ORAL_TABLET | Freq: Two times a day (BID) | ORAL | Status: DC
  Administered 2016-04-14 – 2016-04-25 (×24): 250 mg via ORAL
  Filled 2016-04-14 (×25): qty 1

## 2016-04-14 NOTE — Progress Notes (Signed)
  [name], Christine Knight 04/14/2016, 3:23 PM

## 2016-04-14 NOTE — Progress Notes (Signed)
Patient ID: Christine Knight, female   DOB: [birth year], 65 y.o.   MRN: [id number]     Advanced Heart Failure Rounding Note   Subjective:    Admitted with marked volume overload.  Milrinone started empirically on 6/3 by Dr Royann Shivers with ongoing poor UOP despite high dose diuretics.  She continues to have poor UOP, creatinine mildly elevated today to 1.67.    She is short of breath and orthopneic.  Tearful because of lack of progress.    Objective:   Weight Range:  Vital Signs:   Temp:  [98 F (36.7 C)-98.2 F (36.8 C)] 98 F (36.7 C) (06/04 0525) Pulse Rate:  [58-73] 73 (06/04 0525) Resp:  [18] 18 (06/04 0525) BP: (93-107)/(57-76) 102/58 mmHg (06/04 0525) SpO2:  [97 %-98 %] 97 % (06/04 0525) Last BM Date: 04/12/16  Weight change: Filed Weights   04/12/16 0025 04/12/16 0519 04/13/16 0627  Weight: 273 lb 4.8 oz (123.968 kg) 272 lb 11.2 oz (123.696 kg) 272 lb 9.6 oz (123.651 kg)    Intake/Output:   Intake/Output Summary (Last 24 hours) at 04/14/16 1110 Last data filed at 04/14/16 0900  Gross per 24 hour  Intake 2457.76 ml  Output    400 ml  Net 2057.76 ml     Physical Exam: General:  Dyspneic at rest. Sitting in the chair.  HEENT: normal Neck: supple. JVP to ear. Carotids 2+ bilat; no bruits. No lymphadenopathy or thryomegaly appreciated. Cor: PMI nondisplaced. Regular rate & rhythm. No rubs, gallops or murmurs. Lungs: clear Abdomen: soft, nontender, nondistended. No hepatosplenomegaly. No bruits or masses. Good bowel sounds. Extremities: no cyanosis, clubbing, rash, edema. R and LLE 3+ edema to thighs.  Neuro: alert & orientedx3, cranial nerves grossly intact. moves all 4 extremities w/o difficulty. Affect pleasant  Telemetry: SR PVCs 70s   Labs: Basic Metabolic Panel:  Recent Labs Lab 04/11/16 1929 04/12/16 0200 04/13/16 0248 04/14/16 0539  NA 137 137 133* 132*  K 4.3 4.1 5.1 4.1  CL 101 101 97* 93*  CO2 32  GLUCOSE 110* 109* 86 100*  BUN 27*  27* 31* 36*  CREATININE 1.49* 1.38* 1.46* 1.67*  CALCIUM 9.5 9.4 9.1 9.2  MG  --  2.2  --   --     Liver Function Tests:  Recent Labs Lab 04/12/16 0200  AST 32  ALT 19  ALKPHOS 114  BILITOT 1.9*  PROT 7.9  ALBUMIN 3.5   No results for input(s): LIPASE, AMYLASE in the last 168 hours. No results for input(s): AMMONIA in the last 168 hours.  CBC:  Recent Labs Lab 04/11/16 1929 04/12/16 0200  WBC 4.7 4.5  HGB 13.8 12.8  HCT 43.1 40.6  MCV 96.2 94.9  PLT 218 217    Cardiac Enzymes:  Recent Labs Lab 04/12/16 0200 04/12/16 0634 04/12/16 1252  TROPONINI 0.05* 0.05* 0.05*    BNP: BNP (last 3 results)  Recent Labs  12/08/15 0922 01/04/16 1139 04/11/16 1929  BNP 1155.5* 513.3* 1176.5*    ProBNP (last 3 results) No results for input(s): PROBNP in the last 8760 hours.    Other results:  Imaging: Dg Chest Port 1 View  04/13/2016  CLINICAL DATA:  65 year old female with a history of PICC placement EXAM: PORTABLE CHEST 1 VIEW COMPARISON:  04/11/2016 FINDINGS: Cardiomediastinal silhouette unchanged with cardiomegaly. Surgical changes of prior median sternotomy and CABG. Interval placement of right upper extremity PICC, with the tip appearing to terminate at the superior cavoatrial junction. Similar appearance  of interlobular septal thickening with developing airspace disease at the bilateral lung bases. IMPRESSION: Interval placement of right upper extremity PICC, with the tip appearing to terminate at the superior cavoatrial junction. Evidence of persisting edema. Cardiomegaly and surgical changes of median sternotomy and CABG Signed, Christine Knight S. Christine AveWagner, Knight [facility] Electronically Signed   By: Gilmer MorJaime  Knight D.O.   On: 04/13/2016 14:58     Medications:     Scheduled Medications: . acetaZOLAMIDE  250 mg Oral BID  . aspirin EC  81 mg Oral Daily  . atorvastatin  40 mg Oral Daily  . carvedilol  3.125 mg  Oral BID WC  . enoxaparin (LOVENOX) injection  40 mg Subcutaneous Q24H  . glipiZIDE  10 mg Oral BID AC  . metolazone  5 mg Oral BID  . multivitamin with minerals   Oral q morning - 10a  . potassium chloride SA  20 mEq Oral BID  . sodium chloride flush  10-40 mL Intracatheter Q12H  . sodium chloride flush  3 mL Intravenous Q12H  . [START ON 04/15/2016] spironolactone  25 mg Oral Daily    Infusions: . furosemide (LASIX) infusion 30 mg/hr (04/14/16 0246)  . milrinone 0.25 mcg/kg/min (04/14/16 0246)    PRN Medications: sodium chloride, acetaminophen, dextromethorphan-guaiFENesin, ondansetron (ZOFRAN) IV, sodium chloride flush, sodium chloride flush   Assessment/Plan/Discussion   [name] is a 65 yo woman with PMH of ischemic cardiomyopathy and chronic systolic heart failure with last EF ~ 15-20%, CABG 08/2011, morbid obesity, pulmonary hypertension (PA pressure 72 mmHg), poorly controlled T2DM.   1. Acute on chronic systolic heart failure: Ischemic CMP, EF 10-15%.  She was admitted with marked volume overload and remains markedly volume overloaded.  Got PICC on 6/3 and milrinone was started.  She is diuresing poorly.  Creatinine up slightly.  She was difficult to diurese last admission as well.  - Continue milrinone 0.25, will check co-ox today.  - Transfer to step-down for CVP monitoring.  - Stop losartan for now with rise in creatinine and soft BP.  - Continue low dose Coreg.  - Will continue Lasix gtt at 30 mg/hr.  Increase metolazone to 5 mg bid.  Add acetazolamide 250 mg bid (required this last admission for effective diuresis).  Increase spironolactone to 25 mg daily.  Follow creatinine closely.  - Place foley.  2. CAD: Stable disease on recent angiogram 2/17. No evidence for ACS.  3. CKD: Stage III.  Will need to follow closely on current diuretic regimen.   Length of Stay: 3  Christine Knight   04/14/2016, 11:10 AM  Advanced Heart Failure Team Pager [phone] (M-F; 7a - 4p)    Please contact [facility] for night-coverage after hours (4p -7a ) and weekends on [email or web address]

## 2016-04-14 NOTE — Progress Notes (Signed)
Pt trans to 3w for more close monitoring

## 2016-04-15 ENCOUNTER — Inpatient Hospital Stay (HOSPITAL_COMMUNITY): Payer: Medicaid Other

## 2016-04-15 DIAGNOSIS — N179 Acute kidney failure, unspecified: Secondary | ICD-10-CM

## 2016-04-15 DIAGNOSIS — I5043 Acute on chronic combined systolic (congestive) and diastolic (congestive) heart failure: Secondary | ICD-10-CM

## 2016-04-15 LAB — BASIC METABOLIC PANEL
Anion gap: 7 (ref 5–15)
BUN: 37 mg/dL — AB (ref 6–20)
CHLORIDE: 91 mmol/L — AB (ref 101–111)
CO2: 31 mmol/L (ref 22–32)
CREATININE: 1.79 mg/dL — AB (ref 0.44–1.00)
Calcium: 8.6 mg/dL — ABNORMAL LOW (ref 8.9–10.3)
GFR calc Af Amer: 33 mL/min — ABNORMAL LOW (ref >60)
GFR calc non Af Amer: 29 mL/min — ABNORMAL LOW (ref >60)
Glucose, Bld: 221 mg/dL — ABNORMAL HIGH (ref 65–99)
Potassium: 5.2 mmol/L — ABNORMAL HIGH (ref 3.5–5.1)
SODIUM: 129 mmol/L — AB (ref 135–145)

## 2016-04-15 LAB — URINALYSIS, ROUTINE W REFLEX MICROSCOPIC
Bilirubin Urine: NEGATIVE
GLUCOSE, UA: NEGATIVE mg/dL
Ketones, ur: NEGATIVE mg/dL
Nitrite: NEGATIVE
PH: 7 (ref 5.0–8.0)
PROTEIN: NEGATIVE mg/dL
Specific Gravity, Urine: 1.009 (ref 1.005–1.030)

## 2016-04-15 LAB — GLUCOSE, CAPILLARY
GLUCOSE-CAPILLARY: 169 mg/dL — AB (ref 65–99)
GLUCOSE-CAPILLARY: 171 mg/dL — AB (ref 65–99)
GLUCOSE-CAPILLARY: 176 mg/dL — AB (ref 65–99)
Glucose-Capillary: 167 mg/dL — ABNORMAL HIGH (ref 65–99)

## 2016-04-15 LAB — CARBOXYHEMOGLOBIN
Carboxyhemoglobin: 2.4 % — ABNORMAL HIGH (ref 0.5–1.5)
Methemoglobin: 0.5 % (ref 0.0–1.5)
O2 Saturation: 86.9 %
TOTAL HEMOGLOBIN: 11.5 g/dL — AB (ref 12.0–16.0)

## 2016-04-15 LAB — URINE MICROSCOPIC-ADD ON: SQUAMOUS EPITHELIAL / LPF: NONE SEEN

## 2016-04-15 LAB — CREATININE, URINE, RANDOM: Creatinine, Urine: 29.74 mg/dL

## 2016-04-15 LAB — SODIUM, URINE, RANDOM: Sodium, Ur: 91 mmol/L

## 2016-04-15 MED ORDER — DEXTROSE 5 % IV SOLN
160.0000 mg | Freq: Four times a day (QID) | INTRAVENOUS | Status: DC
  Administered 2016-04-15 – 2016-04-17 (×7): 160 mg via INTRAVENOUS
  Filled 2016-04-15 (×8): qty 16

## 2016-04-15 MED ORDER — TECHNETIUM TC 99M DIETHYLENETRIAME-PENTAACETIC ACID
29.3000 | Freq: Once | INTRAVENOUS | Status: DC | PRN
Start: 2016-04-15 — End: 2016-04-26

## 2016-04-15 MED ORDER — TECHNETIUM TO 99M ALBUMIN AGGREGATED
4.1900 | Freq: Once | INTRAVENOUS | Status: AC | PRN
  Administered 2016-04-15: 4 via INTRAVENOUS

## 2016-04-15 NOTE — Progress Notes (Signed)
Patient ID: Christine Knight, female   DOB: 05-Feb-1951, 65 y.o.   MRN: [id number]     Advanced Heart Failure Rounding Note   Subjective:    Admitted with marked volume overload.  Milrinone started empirically on 6/3 by Dr Royann Shiversroitoru with ongoing poor UOP despite high dose diuretics.    She continues to have poor UOP despite addition of acetazolamide. Creatinine trending up to 1.79. Na trending down. K 5.2 this am.  Coox  86.9 (>? Accuracy).  CVP 20-21.  She remains dyspneic at rest which is worse with any exertion and orthopneic.   She remains discouraged from her lack of progress.     I/O positive and weight up despite   Objective:   Weight Range:  Vital Signs:   Temp:  [97.9 F (36.6 C)-98.8 F (37.1 C)] 98.8 F (37.1 C) (06/05 0813) Pulse Rate:  [72-83] 80 (06/05 0810) Resp:  [18-21] 21 (06/05 0955) BP: (98-115)/(50-61) 104/60 mmHg (06/05 0955) SpO2:  [93 %-95 %] 93 % (06/05 0810) Weight:  [276 lb 9.6 oz (125.465 kg)] 276 lb 9.6 oz (125.465 kg) (06/05 0445) Last BM Date: 04/14/16  Weight change: Filed Weights   04/12/16 0519 04/13/16 0627 04/15/16 0445  Weight: 272 lb 11.2 oz (123.696 kg) 272 lb 9.6 oz (123.651 kg) 276 lb 9.6 oz (125.465 kg)    Intake/Output:   Intake/Output Summary (Last 24 hours) at 04/15/16 1000 Last data filed at 04/15/16 0813  Gross per 24 hour  Intake 1311.6 ml  Output    775 ml  Net  536.6 ml     Physical Exam: CVP 20-21 General:  Dyspneic at rest. Sitting in the chair.  HEENT: normal Neck: supple. JVP to ear. Carotids 2+ bilat; no bruits. No lymphadenopathy or thryomegaly appreciated. Cor: PMI nondisplaced. Regular rate & rhythm. No rubs, gallops or murmurs. Lungs: clear Abdomen: Obese, soft, NT, ND, no HSM. No bruits or masses. +BS  Extremities: no cyanosis, clubbing, rash, edema. R and LLE 3+ edema to thighs.  Neuro: alert & orientedx3, cranial nerves grossly intact. moves all 4 extremities w/o difficulty. Affect pleasant GU: Darker  yellow urine in foley bag.   Telemetry: SR PVCs 70s   Labs: Basic Metabolic Panel:  Recent Labs Lab 04/11/16 1929 04/12/16 0200 04/13/16 0248 04/14/16 0539 04/15/16 0434  NA 137 137 133* 132* 129*  K 4.3 4.1 5.1 4.1 5.2*  CL 101 101 97* 93* 91*  CO2 29 29 27  32 31  GLUCOSE 110* 109* 86 100* 221*  BUN 27* 27* 31* 36* 37*  CREATININE 1.49* 1.38* 1.46* 1.67* 1.79*  CALCIUM 9.5 9.4 9.1 9.2 8.6*  MG  --  2.2  --   --   --     Liver Function Tests:  Recent Labs Lab 04/12/16 0200  AST 32  ALT 19  ALKPHOS 114  BILITOT 1.9*  PROT 7.9  ALBUMIN 3.5   No results for input(s): LIPASE, AMYLASE in the last 168 hours. No results for input(s): AMMONIA in the last 168 hours.  CBC:  Recent Labs Lab 04/11/16 1929 04/12/16 0200  WBC 4.7 4.5  HGB 13.8 12.8  HCT 43.1 40.6  MCV 96.2 94.9  PLT 218 217    Cardiac Enzymes:  Recent Labs Lab 04/12/16 0200 04/12/16 0634 04/12/16 1252  TROPONINI 0.05* 0.05* 0.05*    BNP: BNP (last 3 results)  Recent Labs  12/08/15 0922 01/04/16 1139 04/11/16 1929  BNP 1155.5* 513.3* 1176.5*    ProBNP (last 3 results) No  results for input(s): PROBNP in the last 8760 hours.    Other results:  Imaging: Dg Chest Port 1 View  04/13/2016  CLINICAL DATA:  65 year old female with a history of PICC placement EXAM: PORTABLE CHEST 1 VIEW COMPARISON:  04/11/2016 FINDINGS: Cardiomediastinal silhouette unchanged with cardiomegaly. Surgical changes of prior median sternotomy and CABG. Interval placement of right upper extremity PICC, with the tip appearing to terminate at the superior cavoatrial junction. Similar appearance of interlobular septal thickening with developing airspace disease at the bilateral lung bases. IMPRESSION: Interval placement of right upper extremity PICC, with the tip appearing to terminate at the superior cavoatrial junction. Evidence of persisting edema. Cardiomegaly and surgical changes of median sternotomy and CABG  Signed, Yvone Neu. Loreta Ave, DO [facility] Electronically Signed   By: Gilmer Mor D.O.   On: 04/13/2016 14:58     Medications:     Scheduled Medications: . acetaZOLAMIDE  250 mg Oral BID  . aspirin EC  81 mg Oral Daily  . atorvastatin  40 mg Oral Daily  . carvedilol  3.125 mg Oral BID WC  . enoxaparin (LOVENOX) injection  40 mg Subcutaneous Q24H  . glipiZIDE  10 mg Oral BID AC  . metolazone  5 mg Oral BID  . multivitamin with minerals   Oral q morning - 10a  . sodium chloride flush  10-40 mL Intracatheter Q12H  . sodium chloride flush  3 mL Intravenous Q12H    Infusions: . furosemide (LASIX) infusion 30 mg/hr (04/14/16 2143)  . milrinone 0.25 mcg/kg/min (04/15/16 0850)    PRN Medications: sodium chloride, acetaminophen, dextromethorphan-guaiFENesin, ondansetron (ZOFRAN) IV, sodium chloride flush, sodium chloride flush   Assessment/Plan/Discussion   Ms. Stansel is a 65 yo woman with PMH of ischemic cardiomyopathy and chronic systolic heart failure with last EF ~ 15-20%, CABG 08/2011, morbid obesity, pulmonary hypertension (PA pressure 72 mmHg), poorly controlled T2DM.   1. Acute on chronic systolic heart failure: Ischemic CMP, EF 10-15%.  She was admitted with marked volume overload and remains markedly volume overloaded.  Got PICC on 6/3 and milrinone was started.  She is diuresing poorly.  Creatinine up slightly.  She was difficult to diurese last admission as well.  - Continue milrinone 0.25. Coox high this morning, 86.9%.  - Continue to hold losartan for now with rise in creatinine and soft BP.  - Continue low dose Coreg.  - Will continue Lasix gtt at 30 mg/hr.  Continue metolazone 5 mg bid.  Continue acetazolamide 250 mg bid (required this last admission for effective diuresis).   - Hold spiro with elevated K.  Follow creatinine closely.  2. CAD: Stable disease on recent angiogram 2/17. No evidence for ACS.  3.  CKD: Stage III.  Will need to follow closely on current diuretic regimen.   With her continued poor diuresis, it appears she may need to undergo CVVH to adequately diurese.  She remains dyspneic with any exertion and orthopneic.      With intractable volume overload and worsening electrolyte imbalance, will consult renal for possible CVVH vs trial of renal dosing of IV lasix.   Length of Stay: 4  [name], [place] 04/15/2016, 10:00 AM  Advanced Heart Failure Team Pager [phone] (M-F; 7a - 4p)  Please contact [facility] for night-coverage after hours (4p -7a ) and weekends on [email or web address]   Patient seen and examined with Otilio Saber, PA-C. We discussed all aspects of the encounter. I agree with the assessment  and plan as stated above.   She remains markedly volume overloaded despite extremely high-dose diuretic combination and adequate co-ox. Creatinine slightly worse. Will ask Renal to see. Will continue milrinone for now. Discussed possible need for HD and with her severe HF she may not tolerate. May be nearing Palliative situation.   Maanya Hippert,MD 10:08 PM

## 2016-04-15 NOTE — Care Management Note (Addendum)
Case Management Note  Patient Details  Name: Lum BabeBarbara Perfetti MRN: [id number] Date of Birth: [birth year]  Subjective/Objective: Pt admitted with marked volume overload. Initially initiated on IV lasix gtt. Milrinone started empirically on 6/3 by Dr Royann Shiversroitoru with ongoing poor UOP despite high dose diuretics. Pt continues to have poor UOP, creatinine mildly elevated today to 1.67.                    Action/Plan: CM will continue to monitor for disposition needs. [facility]    Expected Discharge Date:                  Expected Discharge Plan:  Home w Home Health Services  In-House Referral:  NA  Discharge planning Services  CM Consult  Post Acute Care Choice:   N/A Choice offered to:   N/A  DME Arranged:   N/A DME Agency:   N/A  HH Arranged:   N/A HH Agency:   N/A  Status of Service: Completed Medicare Important Message Given:    Date Medicare IM Given:    Medicare IM give by:    Date Additional Medicare IM Given:    Additional Medicare Important Message give by:     If discussed at Long Length of Stay Meetings, dates discussed:    Additional Comments: 1535 04-23-16 Tomi BambergerBrenda Graves-Bigelow, RN,BSN [phone] Pt continues on IV Milrinone gtt. CM received consult for HF referral. CM did speak with pt and she states that she will not need any HH Services once she is d/c. No further needs from CM at this time.    CM was able to speak with pt in regards to home care needs. Per pt she will not need any HH Services. CM did discuss making sure she is weighing self and watching fluid intake. Per pt her daughter and her sister will be assisting with care. CM will continue to monitor. Tomi BambergerBrenda Graves-Bigelow, RN,BSN 04-16-16  Gala LewandowskyGraves-Bigelow, Lynasia Meloche Kaye, RN 04/15/2016, 10:04 AM

## 2016-04-15 NOTE — Plan of Care (Signed)
Problem: Cardiac: Goal: Ability to achieve and maintain adequate cardiopulmonary perfusion will improve Outcome: Not Progressing Patient admitted with heart failure exacerbation, currently diuresing on continuous IV lasix, IV milrinone and adjuvant oral therapies. Intake has been minimal this shift, including only sips of water with medications. However, despite this, the patient remains dyspneic with minimal activity (just shifting positions in a recliner is sufficient to promote symptoms) and is showing clear signs of extensive fluid volume overload. Crackles have been present in both lung bases since beginning of this shift and patient has a frequent, "chuffing" cough non-productive of sputum. AM COOX has resulted and is improved, however, central venous pressure has been steadily increasing overnight. Initially, CVP was 16, then 18 and now 21 mmHg.  Frequent, multifocal PVCs were noted on telemetry during previous shift and continued overnight with no sustained ectopic episodes. Echocardiogram was done and resulted yesterday with an ejection fraction of 15-20%; patient at risk for ventricular arrhythmias.  Vital signs have been stable, though BP is soft: Filed Vitals:    04/14/16 1318 04/14/16 1538 04/14/16 2045 04/14/16 2349  BP: 98/55 105/61 115/58 99/50  Pulse: 80 83 79 72  Temp: 98.1 F (36.7 C) 97.9 F (36.6 C) 98.3 F (36.8 C) 98.1 F (36.7 C)  TempSrc: Oral Oral Oral Oral  Resp: 20 20 20 20   Height:          Weight:          SpO2: 93% 94% 94% 93%    AM vitals and weight are still pending documentation at this time.  Patient was able to ambulate to bathroom with assistance twice overnight, both times resulting in exertional dyspnea with extended recovery period. At rest, she reports being less symptomatic than when she was admitted, but she states that she is "very tired."  Discussed plan of care, diagnostic testing and medications at time of assessment earlier this  shift. Her understanding of heart failure principles is very limited. She will need continued and focused heart failure education to maximize her potential for successful, long-term health maintenance post-discharge.  Continuing to monitor.

## 2016-04-15 NOTE — Progress Notes (Signed)
Patient states she is "tired," but is easily arousable and oriented x 4.  Asymptomatic at rest.  No complaint of lightheadedness or dizziness.  Of note:  1000 ml of pink-tinged urine is present in Foley catheter drainage bag; emptied at time of assessment.

## 2016-04-15 NOTE — Progress Notes (Signed)
Inpatient Diabetes Program Recommendations  AACE/ADA: New Consensus Statement on Inpatient Glycemic Control (2015)  Target Ranges:  Prepandial:   less than 140 mg/dL      Peak postprandial:   less than 180 mg/dL (1-2 hours)      Critically ill patients:  140 - 180 mg/dL   Results for Christine Knight, Christine (MRN [id number]) as of 04/15/2016 09:35  Ref. Range 04/14/2016 05:52 04/14/2016 11:47 04/14/2016 16:14 04/14/2016 21:09  Glucose-Capillary Latest Ref Range: 65-99 mg/dL 865125 (H) 784156 (H) 696186 (H) 217 (H)    Admit with: CHF Flare  History: DM, CHF, CKD    Home DM Meds: Glipizide 10 mg bid       Janumet 50/1000 bid  Current Insulin Orders: Glipizide 10 mg bid      MD- Please consider starting Novolog Sensitive Correction Scale/ SSI (0-9 units) TID AC + HS     --Will follow patient during hospitalization--  Ambrose FinlandJeannine Johnston Jodi Criscuolo RN, MSN, CDE Diabetes Coordinator Inpatient Glycemic Control Team Team Pager: [phone] (8a-5p)

## 2016-04-15 NOTE — Consult Note (Signed)
Christine Knight is an 65 y.o. female referred by Dr Haroldine Laws   Chief Complaint: Volume overload and acute on chronic kidney disease, mild hyperkalemia  HPI: 64yo BF with severe cardiomyopathy, admitted 04/11/16 for volume overload. EF 15-20%. Has been on lasix gtt but UO only 1-2L per day.  Scr has also increased from baseline 1.5 to 1.79. K sl high at 5.2. She had been on losartan and spironolactone until 04/14/16  She is reluctant to consider HD but will if IV lasix fails.    Past Medical History  Diagnosis Date  . Obesity, morbid ([place])   . Chronic systolic heart failure (HCC)     Chronic systolic CHF  . Hyperlipidemia 08/10/11  . Diabetes mellitus type 2 in obese ([place]) 08/10/11  . CAD (coronary artery disease) 08/2011    NSTEMI with subsequent CABG 08/13/2011 with LIMA-LAD, SVG-diagonal, SVG-OM, SVG-PDA  . Ischemic cardiomyopathy 08/13/2011    EF 20% in 08/2011, still 25% 10/21/2011  . Pleural effusion     Requiring L thoracentesis 09/02/11  . Mitral regurgitation     Mild by echo 10/21/11  . Hypertension   . Pulmonary hypertension ([place])     Echo, September, 2013, 72 mmHg.  Marland Kitchen Ejection fraction < 50%     EF 20%, October, 2012  //  EF 25% December, 2012  //   EF 35%, echo, September, 2013  . Myocardial infarction ([place]) 08/2011  . Orthopnoea     "progressively worse over last 3 wks" (03/17/2013)  . CHF (congestive heart failure) ([place])   . Carpal tunnel syndrome on both sides     "post OHS in 08/2011; resolved now" (03/17/2013)    Past Surgical History  Procedure Laterality Date  . Laparoscopy  1980's?    "removed gallstones; not gallbladder" (03/17/2013)  . Coronary artery bypass graft  08/13/11    CABG x4 with LIMA to LAD, SVG to Diag, SVG to OM, SVG to PDA, EVH via both thighs  . Cardiac catheterization N/A 12/13/2015    Procedure: Right/Left Heart Cath and Coronary/Graft Angiography;  Surgeon: Peter M Martinique, MD;  Location: [facility];  Service: Cardiovascular;  Laterality: N/A;     Family History  Problem Relation Age of Onset  . Heart disease Father   . Cancer Neg Hx   . Diabetes Neg Hx   . Kidney disease Neg Hx   . Hypertension Maternal Grandmother   . Hypertension Sister    Social History:  reports that she quit smoking about 37 years ago. Her smoking use included Cigarettes. She has never used smokeless tobacco. She reports that she drinks alcohol. She reports that she does not use illicit drugs.  Allergies:  Allergies  Allergen Reactions  . Lisinopril Cough    cough  . Metformin And Related Anaphylaxis    Medications Prior to Admission  Medication Sig Dispense Refill  . acetaminophen (TYLENOL) 500 MG tablet Take 1,000 mg by mouth every 12 (twelve) hours as needed for mild pain or moderate pain.    Marland Kitchen aspirin 81 MG tablet Take 1 tablet (81 mg total) by mouth daily. 30 tablet 9  . atorvastatin (LIPITOR) 10 MG tablet Take 1 tablet (10 mg total) by mouth daily. 30 tablet 6  . Blood Glucose Monitoring Suppl (ONETOUCH VERIO) W/DEVICE KIT 1 Act by Does not apply route 3 (three) times daily. 2 kit 0  . carvedilol (COREG) 3.125 MG tablet Take 1 tablet (3.125 mg total) by mouth 2 (two) times daily with a  meal. 60 tablet 6  . dextromethorphan-guaiFENesin (MUCINEX DM) 30-600 MG 12hr tablet Take 1 tablet by mouth daily as needed for cough.    Marland Kitchen glipiZIDE (GLUCOTROL) 10 MG tablet Take 10 mg by mouth 2 (two) times daily.  1  . glucose blood test strip Use TID 100 each 12  . losartan (COZAAR) 25 MG tablet Take 0.5 tablets (12.5 mg total) by mouth daily. 15 tablet 6  . Multiple Vitamins-Minerals (CENTRUM SILVER 50+WOMEN PO) Take 1 tablet by mouth every morning.    . potassium chloride SA (K-DUR,KLOR-CON) 20 MEQ tablet Take 1 tablet (20 mEq total) by mouth daily. 30 tablet 6  . spironolactone (ALDACTONE) 25 MG tablet Take 0.5 tablets (12.5 mg total) by mouth daily. 15 tablet 6  . torsemide (DEMADEX) 20 MG tablet Take 2 tablets (40 mg total) by mouth 2 (two) times  daily. 120 tablet 6  . sitaGLIPtin-metformin (JANUMET) 50-1000 MG tablet Take 1 tablet by mouth 2 (two) times daily with a meal. 60 tablet 0     Lab Results: UA: ND  No results for input(s): WBC, HGB, HCT, PLT in the last 72 hours. BMET  Recent Labs  04/13/16 0248 04/14/16 0539 04/15/16 0434  NA 133* 132* 129*  K 5.1 4.1 5.2*  CL 97* 93* 91*  CO2 27 32 31  GLUCOSE 86 100* 221*  BUN 31* 36* 37*  CREATININE 1.46* 1.67* 1.79*  CALCIUM 9.1 9.2 8.6*   LFT No results for input(s): PROT, ALBUMIN, AST, ALT, ALKPHOS, BILITOT, BILIDIR, IBILI in the last 72 hours. Dg Chest Port 1 View  04/13/2016  CLINICAL DATA:  65 year old female with a history of PICC placement EXAM: PORTABLE CHEST 1 VIEW COMPARISON:  04/11/2016 FINDINGS: Cardiomediastinal silhouette unchanged with cardiomegaly. Surgical changes of prior median sternotomy and CABG. Interval placement of right upper extremity PICC, with the tip appearing to terminate at the superior cavoatrial junction. Similar appearance of interlobular septal thickening with developing airspace disease at the bilateral lung bases. IMPRESSION: Interval placement of right upper extremity PICC, with the tip appearing to terminate at the superior cavoatrial junction. Evidence of persisting edema. Cardiomegaly and surgical changes of median sternotomy and CABG Signed, Dulcy Fanny. Earleen Newport, DO [facility] Electronically Signed   By: Corrie Mckusick D.O.   On: 04/13/2016 14:58    ROS: Appetite good Breathing better No CP No abd pain or change in BM No arthritic CO No dysuria + edema  PHYSICAL EXAM: Blood pressure 104/60, pulse 80, temperature 98.4 F (36.9 C), temperature source Oral, resp. rate 21, height _0  (1.676 m), weight 125.465 kg (276 lb 9.6 oz), SpO2 93 %. HEENT: PERRLA EOMI NECK:+ JVD LUNGS:Decreased BS bases, few crackles CARDIAC:Irreg, irreg ABD:+ BS NT ND EXT:4+ edema.  RUA  PICC NEURO:CNI Ox3 no asterixis  Assessment: 1. Acute on CKD 3 in setting of poor CO and lowish BP 2. Severe cardiomyopathy 3. Mild hyperkalemia 4.  DM PLAN: 1. Change to large dose intermittent lasix and Dc lasix gtt 2. Hold carvedilol for SBP < 110 3. Daily Scr and K 4. Check UA 5. Renal diet 6. Discussed role of HD if medical therapy does not work    Ayuub Penley T 04/15/2016, 2:01 PM

## 2016-04-16 DIAGNOSIS — I509 Heart failure, unspecified: Secondary | ICD-10-CM

## 2016-04-16 DIAGNOSIS — I130 Hypertensive heart and chronic kidney disease with heart failure and stage 1 through stage 4 chronic kidney disease, or unspecified chronic kidney disease: Principal | ICD-10-CM

## 2016-04-16 LAB — BASIC METABOLIC PANEL
ANION GAP: 10 (ref 5–15)
Anion gap: 9 (ref 5–15)
BUN: 38 mg/dL — ABNORMAL HIGH (ref 6–20)
BUN: 39 mg/dL — AB (ref 6–20)
CALCIUM: 9.3 mg/dL (ref 8.9–10.3)
CHLORIDE: 89 mmol/L — AB (ref 101–111)
CO2: 33 mmol/L — ABNORMAL HIGH (ref 22–32)
CO2: 34 mmol/L — ABNORMAL HIGH (ref 22–32)
CREATININE: 1.67 mg/dL — AB (ref 0.44–1.00)
Calcium: 9.1 mg/dL (ref 8.9–10.3)
Chloride: 88 mmol/L — ABNORMAL LOW (ref 101–111)
Creatinine, Ser: 1.54 mg/dL — ABNORMAL HIGH (ref 0.44–1.00)
GFR calc non Af Amer: 35 mL/min — ABNORMAL LOW (ref >60)
GFR calc non Af Amer: 31 mL/min — ABNORMAL LOW (ref >60)
GFR, EST AFRICAN AMERICAN: 40 mL/min — AB (ref >60)
GFR, EST AFRICAN AMERICAN: 36 mL/min — AB (ref >60)
Glucose, Bld: 179 mg/dL — ABNORMAL HIGH (ref 65–99)
Glucose, Bld: 210 mg/dL — ABNORMAL HIGH (ref 65–99)
POTASSIUM: 2.7 mmol/L — AB (ref 3.5–5.1)
Potassium: 3.4 mmol/L — ABNORMAL LOW (ref 3.5–5.1)
SODIUM: 132 mmol/L — AB (ref 135–145)
SODIUM: 131 mmol/L — AB (ref 135–145)

## 2016-04-16 LAB — RENAL FUNCTION PANEL
Albumin: 3.3 g/dL — ABNORMAL LOW (ref 3.5–5.0)
Anion gap: 11 (ref 5–15)
BUN: 38 mg/dL — AB (ref 6–20)
CHLORIDE: 88 mmol/L — AB (ref 101–111)
CO2: 34 mmol/L — AB (ref 22–32)
CREATININE: 1.67 mg/dL — AB (ref 0.44–1.00)
Calcium: 9.5 mg/dL (ref 8.9–10.3)
GFR calc Af Amer: 36 mL/min — ABNORMAL LOW (ref >60)
GFR, EST NON AFRICAN AMERICAN: 31 mL/min — AB (ref >60)
GLUCOSE: 153 mg/dL — AB (ref 65–99)
Phosphorus: 4.4 mg/dL (ref 2.5–4.6)
Potassium: 2.8 mmol/L — ABNORMAL LOW (ref 3.5–5.1)
Sodium: 133 mmol/L — ABNORMAL LOW (ref 135–145)

## 2016-04-16 LAB — GLUCOSE, CAPILLARY
GLUCOSE-CAPILLARY: 109 mg/dL — AB (ref 65–99)
GLUCOSE-CAPILLARY: 151 mg/dL — AB (ref 65–99)
GLUCOSE-CAPILLARY: 177 mg/dL — AB (ref 65–99)
GLUCOSE-CAPILLARY: 189 mg/dL — AB (ref 65–99)

## 2016-04-16 LAB — MAGNESIUM: MAGNESIUM: 2.3 mg/dL (ref 1.7–2.4)

## 2016-04-16 LAB — CARBOXYHEMOGLOBIN
Carboxyhemoglobin: 2.5 % — ABNORMAL HIGH (ref 0.5–1.5)
Methemoglobin: 0.6 % (ref 0.0–1.5)
O2 Saturation: 89.5 %
TOTAL HEMOGLOBIN: 11.8 g/dL — AB (ref 12.0–16.0)

## 2016-04-16 MED ORDER — POTASSIUM CHLORIDE CRYS ER 20 MEQ PO TBCR
40.0000 meq | EXTENDED_RELEASE_TABLET | ORAL | Status: AC
  Administered 2016-04-16 (×2): 40 meq via ORAL
  Filled 2016-04-16 (×2): qty 2

## 2016-04-16 MED ORDER — POTASSIUM CHLORIDE CRYS ER 20 MEQ PO TBCR
40.0000 meq | EXTENDED_RELEASE_TABLET | Freq: Once | ORAL | Status: AC
  Administered 2016-04-16: 40 meq via ORAL
  Filled 2016-04-16: qty 2

## 2016-04-16 MED ORDER — SPIRONOLACTONE 25 MG PO TABS
25.0000 mg | ORAL_TABLET | Freq: Every day | ORAL | Status: DC
  Administered 2016-04-16 – 2016-04-26 (×11): 25 mg via ORAL
  Filled 2016-04-16 (×11): qty 1

## 2016-04-16 NOTE — Progress Notes (Signed)
Small runs of VTACH and frequent PVC's throughout the day.  Dr.  Gala RomneyBensimhon notified, new orders for 40 meq K x 1 dose now, check CMET and magnesium at 1900.  Will continue to monitor

## 2016-04-16 NOTE — Progress Notes (Signed)
S: No new CO  Legs feel less tense O:BP 99/40 mmHg  Pulse 71  Temp(Src) 98 F (36.7 C) (Oral)  Resp 20  Ht 5\' 6"  (1.676 m)  Wt 124.512 kg (274 lb 8 oz)  BMI 44.33 kg/m2  SpO2 94%  Intake/Output Summary (Last 24 hours) at 04/16/16 [id number] Last data filed at 04/16/16 0537  Gross per 24 hour  Intake 1141.36 ml  Output   3270 ml  Net -2128.64 ml   Weight change: -0.953 kg (-2 lb 1.6 oz) Gen: Awake and alert CVS: Irreg, irreg Resp:Decreased BS bases Abd:+ BS NTND Ext:4+ edema though less tense NEURO:CNI Ox3 no asterixis   . acetaZOLAMIDE  250 mg Oral BID  . aspirin EC  81 mg Oral Daily  . atorvastatin  40 mg Oral Daily  . carvedilol  3.125 mg Oral BID WC  . enoxaparin (LOVENOX) injection  40 mg Subcutaneous Q24H  . furosemide  160 mg Intravenous Q6H  . glipiZIDE  10 mg Oral BID AC  . metolazone  5 mg Oral BID  . multivitamin with minerals   Oral q morning - 10a  . sodium chloride flush  10-40 mL Intracatheter Q12H  . sodium chloride flush  3 mL Intravenous Q12H   Nm Pulmonary Perf And Vent  04/15/2016  CLINICAL DATA:  Shortness of breath. Inpatient. CHF. Increased lower extremity edema. EXAM: NUCLEAR MEDICINE VENTILATION - PERFUSION LUNG SCAN TECHNIQUE: Ventilation images were obtained in multiple projections using inhaled aerosol Tc-208m DTPA. Perfusion images were obtained in multiple projections after intravenous injection of Tc-748m MAA. RADIOPHARMACEUTICALS:  29.3 mCi Technetium-778m DTPA aerosol inhalation and 4.2 mCi Technetium-318m MAA IV COMPARISON:  04/13/2016 chest radiograph. FINDINGS: Ventilation: There is patchy ventilation throughout both lungs with radiotracer activity throughout the central airways, suggesting diffuse airways disease. Perfusion: There is a small matched perfusion defect at the left lung base less than the chronic radiographic opacity correlating to rounded atelectasis as seen on 06/09/2015 chest CT. No wedge shaped peripheral perfusion defects to suggest  acute pulmonary embolism. IMPRESSION: Very low probability for pulmonary embolism (0-9%). Electronically Signed   By: Delbert PhenixJason A Poff M.D.   On: 04/15/2016 19:17   BMET    Component Value Date/Time   NA 133* 04/16/2016 0511   K 2.8* 04/16/2016 0511   CL 88* 04/16/2016 0511   CO2 34* 04/16/2016 0511   GLUCOSE 153* 04/16/2016 0511   BUN 38* 04/16/2016 0511   CREATININE 1.67* 04/16/2016 0511   CALCIUM 9.5 04/16/2016 0511   GFRNONAA 31* 04/16/2016 0511   GFRAA 36* 04/16/2016 0511   CBC    Component Value Date/Time   WBC 4.5 04/12/2016 0200   RBC 4.28 04/12/2016 0200   HGB 12.8 04/12/2016 0200   HCT 40.6 04/12/2016 0200   PLT 217 04/12/2016 0200   MCV 94.9 04/12/2016 0200   MCH 29.9 04/12/2016 0200   MCHC 31.5 04/12/2016 0200   RDW 17.6* 04/12/2016 0200   LYMPHSABS 0.9 12/08/2015 0922   MONOABS 0.6 12/08/2015 0922   EOSABS 0.2 12/08/2015 0922   BASOSABS 0.0 12/08/2015 0922     Assessment: 1. Acute on CKD 3 due to poor CO and lowish BP, renal fx stable to sl improved.  UO excellent.  Had some RBC's in urine of ? Significance and may simply be due to foley and being on lovenox.  Will repeat at later date 2. Severe cardiomyopathy, improved 3. Mild hyperkalemia, resolved.  Now hypokalemic  Plan: 1. Replace K as you are doing.  Recheck K later today to see if needs more replacement 2. Cont diuretics 3. Daily labs   [name] T

## 2016-04-16 NOTE — Progress Notes (Signed)
K 2.8. Potassium stopped earlier this adm due to elevated level. Suspect drop r/t metolazone. Will rx 40meq x 2 doses - further plans for repletion/monitoring per rounding team. Ronie Spiesayna Amandajo Gonder PA-C

## 2016-04-16 NOTE — Progress Notes (Signed)
Patients potassium level = 2.8 this AM per renal function profile. Paged Dr. Leeann MustJacob Kelly to make aware.  Continuing to monitor.

## 2016-04-16 NOTE — Progress Notes (Signed)
At approximately 21:10, discussed patient's low BP, CVP readings, current medications, mild hematuria (new) and brief synopsis of recent MD notes from HF team and [facility], Dr. Leeann MustJacob Kelly, who was on the floor admitting another patient at the time. Per his consultation, OK to give continuous IV milrinone, oral torsemide, acetazolamide and IV furosemide as scheduled with SBP > 70 as long as patient is "asymptomatic."  Recent vital signs: Filed Vitals:   04/15/16 1650 04/15/16 2041 04/15/16 2100 04/16/16 0000  BP: 108/73 77/34 84/58  88/56  Pulse:  86 76 86  Temp:  98.5 F (36.9 C)  98.3 F (36.8 C)  TempSrc:  Oral  Oral  Resp:  20 24 30   Height:      Weight:      SpO2:  90% 87% 95%    Milrinone has been infusing without issues. At the time both torsemide and acetazolamide were due, patient's BP was 84/58, she was arousable, oriented and conversant. She reported feeling "tired," without dizziness, lightheadedness or nausea. This status remained unchanged at time for furosemide 160 mg IV piggyback to be given (BP 88/56).  CVP has remained elevated at 21 since start of this shift. Urine output is below expected with inotrope and adjuvant diuretic therapies at 1 L since 19:00. Patient was given a renal diet tray around 20:00 consisting of baked fish and vegetables with one 120 ml bottled water. She finished the meal, but has only drunk approximately 1/3 of the bottled water at this point in the shift. JVD continues and lower extremity, perineal and abdominal edema remain unchanged as well. Frequent non-sustained ectopic beats on telemetry; unchanged from last night.  Patient does not appear to be making progress with current interventions for heart failure. Noted for MD review and follow-up on rounds in AM.  Continuing to closely monitor.

## 2016-04-16 NOTE — Progress Notes (Signed)
Patient ID: Christine Knight, female   DOB: Aug 23, 1951, 65 y.o.   MRN: [id number]     Advanced Heart Failure Rounding Note   Subjective:    Admitted with marked volume overload.  Milrinone started empirically on 6/3 by Dr Royann Shivers with ongoing poor UOP despite high dose diuretics.    Renal consulted 04/15/16 and switched from Lasix gtt at 30 to high dose bolus at 160 mg IV q 6 hrs.  Coox 89.5%, CVP 17-18  She feels slightly better this morning and is encouraged that her urine is picking up.   Out 2 L and down 2 lbs overnight with increase in lasix as above. Creatinine stable to improved. K 2.9, supp ordered.   Objective:   Weight Range:  Vital Signs:   Temp:  [98 F (36.7 C)-98.7 F (37.1 C)] 98.7 F (37.1 C) (06/06 0808) Pulse Rate:  [56-86] 75 (06/06 0841) Resp:  [20-30] 23 (06/06 0808) BP: (77-141)/(34-130) 97/55 mmHg (06/06 0841) SpO2:  [87 %-95 %] 88 % (06/06 0808) Weight:  [274 lb 8 oz (124.512 kg)] 274 lb 8 oz (124.512 kg) (06/06 0404) Last BM Date: 04/15/16  Weight change: Filed Weights   04/13/16 0627 04/15/16 0445 04/16/16 0404  Weight: 272 lb 9.6 oz (123.651 kg) 276 lb 9.6 oz (125.465 kg) 274 lb 8 oz (124.512 kg)    Intake/Output:   Intake/Output Summary (Last 24 hours) at 04/16/16 1003 Last data filed at 04/16/16 0900  Gross per 24 hour  Intake 881.04 ml  Output   2700 ml  Net -1818.96 ml     Physical Exam: CVP 17-18 General:  Dyspneic with minimal exertion. Sitting in the chair.  HEENT: normal Neck: supple. JVP to ear. Carotids 2+ bilat; no bruits. No thryomegaly or nodule noted.  Cor: PMI nondisplaced. Regular rate & rhythm. No rubs, gallops or murmurs. Lungs: Decreased at bases. Abdomen: Obese, soft, NT, ND, no HSM. No bruits or masses. +BS  Extremities: no cyanosis, clubbing, rash, edema. R and LLE 3+ edema to thighs.  Neuro: alert & orientedx3, cranial nerves grossly intact. moves all 4 extremities w/o difficulty. Affect pleasant GU: Darker  yellow urine in foley bag.   Telemetry: SR PVCs 70s   Labs: Basic Metabolic Panel:  Recent Labs Lab 04/12/16 0200 04/13/16 0248 04/14/16 0539 04/15/16 0434 04/16/16 0511  NA 137 133* 132* 129* 133*  K 4.1 5.1 4.1 5.2* 2.8*  CL 101 97* 93* 91* 88*  CO2 29 27 32 31 34*  GLUCOSE 109* 86 100* 221* 153*  BUN 27* 31* 36* 37* 38*  CREATININE 1.38* 1.46* 1.67* 1.79* 1.67*  CALCIUM 9.4 9.1 9.2 8.6* 9.5  MG 2.2  --   --   --   --   PHOS  --   --   --   --  4.4    Liver Function Tests:  Recent Labs Lab 04/12/16 0200 04/16/16 0511  AST 32  --   ALT 19  --   ALKPHOS 114  --   BILITOT 1.9*  --   PROT 7.9  --   ALBUMIN 3.5 3.3*   No results for input(s): LIPASE, AMYLASE in the last 168 hours. No results for input(s): AMMONIA in the last 168 hours.  CBC:  Recent Labs Lab 04/11/16 1929 04/12/16 0200  WBC 4.7 4.5  HGB 13.8 12.8  HCT 43.1 40.6  MCV 96.2 94.9  PLT 218 217    Cardiac Enzymes:  Recent Labs Lab 04/12/16 0200 04/12/16 0634 04/12/16 1252  TROPONINI 0.05* 0.05* 0.05*    BNP: BNP (last 3 results)  Recent Labs  12/08/15 0922 01/04/16 1139 04/11/16 1929  BNP 1155.5* 513.3* 1176.5*    ProBNP (last 3 results) No results for input(s): PROBNP in the last 8760 hours.    Other results:  Imaging: Nm Pulmonary Perf And Vent  04/15/2016  CLINICAL DATA:  Shortness of breath. Inpatient. CHF. Increased lower extremity edema. EXAM: NUCLEAR MEDICINE VENTILATION - PERFUSION LUNG SCAN TECHNIQUE: Ventilation images were obtained in multiple projections using inhaled aerosol Tc-8322m DTPA. Perfusion images were obtained in multiple projections after intravenous injection of Tc-3622m MAA. RADIOPHARMACEUTICALS:  29.3 mCi Technetium-6722m DTPA aerosol inhalation and 4.2 mCi Technetium-6022m MAA IV COMPARISON:  04/13/2016 chest radiograph. FINDINGS: Ventilation: There is patchy ventilation throughout both lungs with radiotracer activity throughout the central airways,  suggesting diffuse airways disease. Perfusion: There is a small matched perfusion defect at the left lung base less than the chronic radiographic opacity correlating to rounded atelectasis as seen on 06/09/2015 chest CT. No wedge shaped peripheral perfusion defects to suggest acute pulmonary embolism. IMPRESSION: Very low probability for pulmonary embolism (0-9%). Electronically Signed   By: Delbert PhenixJason A Poff M.D.   On: 04/15/2016 19:17     Medications:     Scheduled Medications: . acetaZOLAMIDE  250 mg Oral BID  . aspirin EC  81 mg Oral Daily  . atorvastatin  40 mg Oral Daily  . carvedilol  3.125 mg Oral BID WC  . enoxaparin (LOVENOX) injection  40 mg Subcutaneous Q24H  . furosemide  160 mg Intravenous Q6H  . glipiZIDE  10 mg Oral BID AC  . metolazone  5 mg Oral BID  . multivitamin with minerals   Oral q morning - 10a  . potassium chloride  40 mEq Oral Q3H  . potassium chloride  40 mEq Oral Once  . sodium chloride flush  10-40 mL Intracatheter Q12H  . sodium chloride flush  3 mL Intravenous Q12H    Infusions: . milrinone 0.25 mcg/kg/min (04/16/16 0837)    PRN Medications: sodium chloride, acetaminophen, dextromethorphan-guaiFENesin, ondansetron (ZOFRAN) IV, sodium chloride flush, sodium chloride flush, technetium TC 47M diethylenetriame-pentaacetic acid   Assessment/Plan/Discussion   Christine Knight is a 65 yo woman with PMH of ischemic cardiomyopathy and chronic systolic heart failure with last EF ~ 15-20%, CABG 08/2011, morbid obesity, pulmonary hypertension (PA pressure 72 mmHg), poorly controlled T2DM.   1. Acute on chronic systolic heart failure: Ischemic CMP, EF 10-15%.  She was admitted with marked volume overload and remains markedly volume overloaded.  Got PICC on 6/3 and milrinone was started.  She is diuresing poorly.  Creatinine up slightly.  She was difficult to diurese last admission as well.  - Continue milrinone 0.25. Will try and wean once adequately diuresed.  Coox  stable.   - Continue to hold losartan for now with rise in creatinine and soft BP.  - Continue low dose Coreg.  - Continue Lasix per renal.  If pt stops responding understands she may need trial of HD.  - Resume spiro 25 mg daily now that she is diuresing. Follow creatinine closely.  2. CAD: Stable disease on recent angiogram 2/17. No evidence for ACS.  3. CKD: Stage III.  Will need to follow closely on current diuretic regimen.   Improved diuresis with renal dosing.  Supp K. Add spiro back. Continue to follow daily Renal function panels.   May need trial of HD if she has worsening response to diuresis.   Length of  Stay: [address]  [name], [place] 04/16/2016, 10:03 AM  Advanced Heart Failure Team Pager [phone] (M-F; 7a - 4p)  Please contact [facility] for night-coverage after hours (4p -7a ) and weekends on [email or web address]   Patient seen and examined with Otilio Saber, PA-C. We discussed all aspects of the encounter. I agree with the assessment and plan as stated above.   Diuresis improved on high-dose bolus lasix and milrinone however still with severe volume overload. Creatinine slightly improved. Will continue current regimen. Supp K+.   Long talk with her and her family that she still may need HD this admit. We also discussed the fact that she may require long-term HD in the near future and with her EF 10-15% her heart may not be able to tolerate. We discussed end-of-life issues briefly. Will see how she responds to diuresis and continue these discussions over the next few days.   Janyia Guion,MD 3:08 PM

## 2016-04-17 LAB — CBC WITH DIFFERENTIAL/PLATELET
Basophils Absolute: 0 10*3/uL (ref 0.0–0.1)
Basophils Relative: 0 %
Eosinophils Absolute: 0.1 10*3/uL (ref 0.0–0.7)
Eosinophils Relative: 3 %
HEMATOCRIT: 35.9 % — AB (ref 36.0–46.0)
Hemoglobin: 11.6 g/dL — ABNORMAL LOW (ref 12.0–15.0)
LYMPHS PCT: 11 %
Lymphs Abs: 0.6 10*3/uL — ABNORMAL LOW (ref 0.7–4.0)
MCH: 29.7 pg (ref 26.0–34.0)
MCHC: 32.3 g/dL (ref 30.0–36.0)
MCV: 92.1 fL (ref 78.0–100.0)
MONO ABS: 0.7 10*3/uL (ref 0.1–1.0)
MONOS PCT: 13 %
NEUTROS ABS: 3.9 10*3/uL (ref 1.7–7.7)
Neutrophils Relative %: 73 %
Platelets: 203 10*3/uL (ref 150–400)
RBC: 3.9 MIL/uL (ref 3.87–5.11)
RDW: 16.7 % — AB (ref 11.5–15.5)
WBC: 5.4 10*3/uL (ref 4.0–10.5)

## 2016-04-17 LAB — RENAL FUNCTION PANEL
ANION GAP: 12 (ref 5–15)
Albumin: 3.4 g/dL — ABNORMAL LOW (ref 3.5–5.0)
BUN: 36 mg/dL — ABNORMAL HIGH (ref 6–20)
CHLORIDE: 87 mmol/L — AB (ref 101–111)
CO2: 34 mmol/L — AB (ref 22–32)
CREATININE: 1.62 mg/dL — AB (ref 0.44–1.00)
Calcium: 9.6 mg/dL (ref 8.9–10.3)
GFR calc non Af Amer: 33 mL/min — ABNORMAL LOW (ref >60)
GFR, EST AFRICAN AMERICAN: 38 mL/min — AB (ref >60)
GLUCOSE: 170 mg/dL — AB (ref 65–99)
Phosphorus: 4.1 mg/dL (ref 2.5–4.6)
Potassium: 3.3 mmol/L — ABNORMAL LOW (ref 3.5–5.1)
Sodium: 133 mmol/L — ABNORMAL LOW (ref 135–145)

## 2016-04-17 LAB — GLUCOSE, CAPILLARY
GLUCOSE-CAPILLARY: 118 mg/dL — AB (ref 65–99)
GLUCOSE-CAPILLARY: 136 mg/dL — AB (ref 65–99)
Glucose-Capillary: 94 mg/dL (ref 65–99)
Glucose-Capillary: 69 mg/dL (ref 65–99)
Glucose-Capillary: 91 mg/dL (ref 65–99)

## 2016-04-17 LAB — CARBOXYHEMOGLOBIN
CARBOXYHEMOGLOBIN: 1.8 % — AB (ref 0.5–1.5)
Methemoglobin: 0.7 % (ref 0.0–1.5)
O2 SAT: 83.4 %
Total hemoglobin: 12.3 g/dL (ref 12.0–16.0)

## 2016-04-17 MED ORDER — FUROSEMIDE 10 MG/ML IJ SOLN
160.0000 mg | Freq: Three times a day (TID) | INTRAVENOUS | Status: DC
  Administered 2016-04-17 (×3): 160 mg via INTRAVENOUS
  Filled 2016-04-17 (×4): qty 16

## 2016-04-17 MED ORDER — POTASSIUM CHLORIDE CRYS ER 20 MEQ PO TBCR
40.0000 meq | EXTENDED_RELEASE_TABLET | Freq: Two times a day (BID) | ORAL | Status: DC
  Administered 2016-04-17 (×2): 40 meq via ORAL
  Filled 2016-04-17 (×2): qty 2

## 2016-04-17 MED ORDER — POTASSIUM CHLORIDE CRYS ER 20 MEQ PO TBCR
40.0000 meq | EXTENDED_RELEASE_TABLET | Freq: Once | ORAL | Status: AC
  Administered 2016-04-17: 40 meq via ORAL
  Filled 2016-04-17: qty 2

## 2016-04-17 NOTE — Progress Notes (Signed)
S: No new CO  Feels better O:BP 124/73 mmHg  Pulse 91  Temp(Src) 98.3 F (36.8 C) (Oral)  Resp 24  Ht 5\' 6"  (1.676 m)  Wt 119.84 kg (264 lb 3.2 oz)  BMI 42.66 kg/m2  SpO2 95%  Intake/Output Summary (Last 24 hours) at 04/17/16 0704 Last data filed at 04/17/16 [id number]  Gross per 24 hour  Intake 1599.06 ml  Output   5875 ml  Net -4275.94 ml   Weight change: -4.672 kg (-10 lb 4.8 oz) Gen: Awake and alert CVS: Irreg, irreg Resp:Decreased BS bases Abd:+ BS NTND Ext:3-4+ edema NEURO:CNI Ox3 no asterixis   . acetaZOLAMIDE  250 mg Oral BID  . aspirin EC  81 mg Oral Daily  . atorvastatin  40 mg Oral Daily  . carvedilol  3.125 mg Oral BID WC  . enoxaparin (LOVENOX) injection  40 mg Subcutaneous Q24H  . furosemide  160 mg Intravenous Q6H  . glipiZIDE  10 mg Oral BID AC  . metolazone  5 mg Oral BID  . multivitamin with minerals   Oral q morning - 10a  . sodium chloride flush  10-40 mL Intracatheter Q12H  . sodium chloride flush  3 mL Intravenous Q12H  . spironolactone  25 mg Oral Daily   Nm Pulmonary Perf And Vent  04/15/2016  CLINICAL DATA:  Shortness of breath. Inpatient. CHF. Increased lower extremity edema. EXAM: NUCLEAR MEDICINE VENTILATION - PERFUSION LUNG SCAN TECHNIQUE: Ventilation images were obtained in multiple projections using inhaled aerosol Tc-5163m DTPA. Perfusion images were obtained in multiple projections after intravenous injection of Tc-1663m MAA. RADIOPHARMACEUTICALS:  29.3 mCi Technetium-3063m DTPA aerosol inhalation and 4.2 mCi Technetium-7763m MAA IV COMPARISON:  04/13/2016 chest radiograph. FINDINGS: Ventilation: There is patchy ventilation throughout both lungs with radiotracer activity throughout the central airways, suggesting diffuse airways disease. Perfusion: There is a small matched perfusion defect at the left lung base less than the chronic radiographic opacity correlating to rounded atelectasis as seen on 06/09/2015 chest CT. No wedge shaped peripheral perfusion  defects to suggest acute pulmonary embolism. IMPRESSION: Very low probability for pulmonary embolism (0-9%). Electronically Signed   By: Delbert PhenixJason A Poff M.D.   On: 04/15/2016 19:17   BMET    Component Value Date/Time   NA 133* 04/17/2016 0426   K 3.3* 04/17/2016 0426   CL 87* 04/17/2016 0426   CO2 34* 04/17/2016 0426   GLUCOSE 170* 04/17/2016 0426   BUN 36* 04/17/2016 0426   CREATININE 1.62* 04/17/2016 0426   CALCIUM 9.6 04/17/2016 0426   GFRNONAA 33* 04/17/2016 0426   GFRAA 38* 04/17/2016 0426   CBC    Component Value Date/Time   WBC 4.5 04/12/2016 0200   RBC 4.28 04/12/2016 0200   HGB 12.8 04/12/2016 0200   HCT 40.6 04/12/2016 0200   PLT 217 04/12/2016 0200   MCV 94.9 04/12/2016 0200   MCH 29.9 04/12/2016 0200   MCHC 31.5 04/12/2016 0200   RDW 17.6* 04/12/2016 0200   LYMPHSABS 0.9 12/08/2015 0922   MONOABS 0.6 12/08/2015 0922   EOSABS 0.2 12/08/2015 0922   BASOSABS 0.0 12/08/2015 0922     Assessment: 1. Acute on CKD 3 due to poor CO and lowish BP.  Renal fx about the same, UO >5L.  Had some RBC's in urine of ? Significance and may simply be due to foley and being on lovenox.  Will repeat at later date 2. Severe cardiomyopathy, improved 3. Mild hyperkalemia, resolved.  Now hypokalemic  Plan: 1. Replace K  2.  Daily Scr 3. Will decrease frequency of lasix 4. Dc metolazone as not sure she needs for diuresis but it will certainly deplete K  Christine Knight

## 2016-04-17 NOTE — Plan of Care (Signed)
Problem: Cardiac: Goal: Ability to achieve and maintain adequate cardiopulmonary perfusion will improve Outcome: Progressing Late entry for night shift 04/16/16:  Good diuretic effect overnight; approximately 2.2 L output with ~10lb weight loss in 24 hours (see I/O flowsheets and weight). Patient states she is feeling better overall and legs are feeling less tight. States she is ready to "get up and walk." Still moderately dyspneic with minor activity (transfer from chair to bed, standing on scale, etc.); desaturates to mid 80% range OOB off O2, but quickly compensates at rest with O2 on at 3.5 L/m. No dyspnea reported at rest overnight.  CVP holding at 18 mmHg last 2 checks (previously ~20 mmHg) AM COOX 83.4%.  Latest vital signs indicate improving cardiac output: Filed Vitals:    04/16/16 2042 04/17/16 0014 04/17/16 0607 04/17/16 0608  BP: 92/53 122/105 124/73    Pulse: 74 72 91    Temp: 98.9 F (37.2 C) 98.3 F (36.8 C) 98.3 F (36.8 C)    TempSrc: Oral Oral Oral    Resp: 18 18 24     Height:          Weight:     119.84 kg (264 lb 3.2 oz)    SpO2: 91% 93% 90% 95%    Progressing well and ready to participate in advancing mobility.  Report given to Efraim KaufmannJessica Honeycutt, RN, who will continue to follow on day shift.

## 2016-04-17 NOTE — Progress Notes (Signed)
Patient ID: Christine Knight, female   DOB: 1951-07-06, 65 y.o.   MRN: [id number]     Advanced Heart Failure Rounding Note   Subjective:    Admitted with marked volume overload.  Milrinone started empirically on 6/3 by Dr Royann Shiversroitoru with ongoing poor UOP despite high dose diuretics.    Renal consulted 04/15/16 and switched from Lasix gtt at 30 to high dose bolus at 160 mg IV q 6 hrs.  Coox 83.4%,   CVP measured as 17-18, currently disconnected for bath.   Prodigious diuresis on lasix bolus q 6 hrs. Feeling much better and encouraged by this turn around.   Out 3.5 L and down 10 lbs overnight with increase in lasix as above. Creatinine stable to improved. K 3.3   Objective:   Weight Range:  Vital Signs:   Temp:  [98 F (36.7 C)-98.9 F (37.2 C)] 98.3 F (36.8 C) (06/07 0607) Pulse Rate:  [58-91] 91 (06/07 0607) Resp:  [17-24] 24 (06/07 0607) BP: (92-124)/(53-105) 124/73 mmHg (06/07 0607) SpO2:  [88 %-95 %] 95 % (06/07 78290608) Weight:  [264 lb 3.2 oz (119.84 kg)] 264 lb 3.2 oz (119.84 kg) (06/07 0607) Last BM Date: 04/16/16  Weight change: Filed Weights   04/15/16 0445 04/16/16 0404 04/17/16 0607  Weight: 276 lb 9.6 oz (125.465 kg) 274 lb 8 oz (124.512 kg) 264 lb 3.2 oz (119.84 kg)    Intake/Output:   Intake/Output Summary (Last 24 hours) at 04/17/16 0809 Last data filed at 04/17/16 0659  Gross per 24 hour  Intake 1537.6 ml  Output   5875 ml  Net -4337.4 ml     Physical Exam: CVP 17-18 General:  NAD. Sitting in the chair.  HEENT: normal Neck: supple. JVP to ear. Carotids 2+ bilat; no bruits. No thryomegaly or nodule noted.  Cor: PMI nondisplaced. RRR. No rubs, gallops or murmurs. Lungs: Decreased at bases, mild basilar crackles.  Abdomen: Obese, soft, non-tender, non-distended, no HSM. No bruits or masses. +BS  Extremities: no cyanosis, clubbing, rash, edema. R and LLE 3+ edema to thighs.  Neuro: alert & orientedx3, cranial nerves grossly intact. moves all 4  extremities w/o difficulty. Affect pleasant GU: Darker yellow urine in foley bag.   Telemetry: SR PVCs 70s   Labs: Basic Metabolic Panel:  Recent Labs Lab 04/12/16 0200  04/15/16 0434 04/16/16 0511 04/16/16 0945 04/16/16 1943 04/17/16 0426  NA 137  < > 129* 133* 132* 131* 133*  K 4.1  < > 5.2* 2.8* 2.7* 3.4* 3.3*  CL 101  < > 91* 88* 89* 88* 87*  CO2 29  < > 31 34* 33* 34* 34*  GLUCOSE 109*  < > 221* 153* 179* 210* 170*  BUN 27*  < > 37* 38* 38* 39* 36*  CREATININE 1.38*  < > 1.79* 1.67* 1.54* 1.67* 1.62*  CALCIUM 9.4  < > 8.6* 9.5 9.1 9.3 9.6  MG 2.2  --   --   --   --  2.3  --   PHOS  --   --   --  4.4  --   --  4.1  < > = values in this interval not displayed.  Liver Function Tests:  Recent Labs Lab 04/12/16 0200 04/16/16 0511 04/17/16 0426  AST 32  --   --   ALT 19  --   --   ALKPHOS 114  --   --   BILITOT 1.9*  --   --   PROT 7.9  --   --  ALBUMIN 3.5 3.3* 3.4*   No results for input(s): LIPASE, AMYLASE in the last 168 hours. No results for input(s): AMMONIA in the last 168 hours.  CBC:  Recent Labs Lab 04/11/16 1929 04/12/16 0200  WBC 4.7 4.5  HGB 13.8 12.8  HCT 43.1 40.6  MCV 96.2 94.9  PLT 218 217    Cardiac Enzymes:  Recent Labs Lab 04/12/16 0200 04/12/16 0634 04/12/16 1252  TROPONINI 0.05* 0.05* 0.05*    BNP: BNP (last 3 results)  Recent Labs  12/08/15 0922 01/04/16 1139 04/11/16 1929  BNP 1155.5* 513.3* 1176.5*    ProBNP (last 3 results) No results for input(s): PROBNP in the last 8760 hours.    Other results:  Imaging: Nm Pulmonary Perf And Vent  04/15/2016  CLINICAL DATA:  Shortness of breath. Inpatient. CHF. Increased lower extremity edema. EXAM: NUCLEAR MEDICINE VENTILATION - PERFUSION LUNG SCAN TECHNIQUE: Ventilation images were obtained in multiple projections using inhaled aerosol Tc-60m DTPA. Perfusion images were obtained in multiple projections after intravenous injection of Tc-75m MAA. RADIOPHARMACEUTICALS:   29.3 mCi Technetium-60m DTPA aerosol inhalation and 4.2 mCi Technetium-69m MAA IV COMPARISON:  04/13/2016 chest radiograph. FINDINGS: Ventilation: There is patchy ventilation throughout both lungs with radiotracer activity throughout the central airways, suggesting diffuse airways disease. Perfusion: There is a small matched perfusion defect at the left lung base less than the chronic radiographic opacity correlating to rounded atelectasis as seen on 06/09/2015 chest CT. No wedge shaped peripheral perfusion defects to suggest acute pulmonary embolism. IMPRESSION: Very low probability for pulmonary embolism (0-9%). Electronically Signed   By: Delbert Phenix M.D.   On: 04/15/2016 19:17     Medications:     Scheduled Medications: . acetaZOLAMIDE  250 mg Oral BID  . aspirin EC  81 mg Oral Daily  . atorvastatin  40 mg Oral Daily  . carvedilol  3.125 mg Oral BID WC  . enoxaparin (LOVENOX) injection  40 mg Subcutaneous Q24H  . furosemide  160 mg Intravenous TID  . glipiZIDE  10 mg Oral BID AC  . multivitamin with minerals   Oral q morning - 10a  . potassium chloride  40 mEq Oral BID  . sodium chloride flush  10-40 mL Intracatheter Q12H  . sodium chloride flush  3 mL Intravenous Q12H  . spironolactone  25 mg Oral Daily    Infusions: . milrinone 0.25 mcg/kg/min (04/17/16 0508)    PRN Medications: sodium chloride, acetaminophen, dextromethorphan-guaiFENesin, ondansetron (ZOFRAN) IV, sodium chloride flush, sodium chloride flush, technetium TC 8M diethylenetriame-pentaacetic acid   Assessment/Plan/Discussion   Christine Knight is a 65 yo woman with PMH of ischemic cardiomyopathy and chronic systolic heart failure with last EF ~ 15-20%, CABG 08/2011, morbid obesity, pulmonary hypertension (PA pressure 72 mmHg), poorly controlled T2DM.   1. Acute on chronic systolic heart failure: Ischemic CMP, EF 10-15%.  She was admitted with marked volume overload and remains markedly volume overloaded.  Got PICC  on 6/3 and milrinone was started.  Diuresis has much improved. Creatinine stable to improved. She was difficult to diurese last admission as well.  - Continue milrinone 0.25. Will try and wean once adequately diuresed.  Coox stable.   - Continue to hold losartan for now with rise in creatinine and soft BP.  - Continue low dose Coreg.  - Continue Lasix per renal.  If pt stops responding understands she may need trial of HD.  - Continue spiro 25 mg daily. Follow creatinine closely.  2. CAD: Stable disease on recent angiogram  2/17. No evidence for ACS.  3. CKD: Stage III.  Will need to follow closely on current diuretic regimen.   Continues to diuresis weell with renal dosing.  Supp K. Continue spiro. Continue to follow daily Renal function panels.    Recurrent bouts of intractable volume overload worrisome for her overall prognosis.  Not sure how she would tolerate HD with her low EF. She has improved for now, so will continue to address changes as needed.    Length of Stay: [address]  [name], [place] 04/17/2016, 8:09 AM  Advanced Heart Failure Team Pager [phone] (M-F; 7a - 4p)  Please contact [facility] for night-coverage after hours (4p -7a ) and weekends on [email or web address]  Patient seen and examined with Otilio Saber, PA-C. We discussed all aspects of the encounter. I agree with the assessment and plan as stated above.   She is responding well to high-dose bolus lasix. Renal function continues to improve. Co-ox high. Need to watch for developing infection. Recent UA was fine. Will decrease milrinone to 0.125. Will supp K+. She remains very tenuous.   Bensimhon, Daniel,MD 9:10 AM

## 2016-04-18 DIAGNOSIS — E876 Hypokalemia: Secondary | ICD-10-CM | POA: Diagnosis present

## 2016-04-18 LAB — CBC WITH DIFFERENTIAL/PLATELET
BASOS PCT: 0 %
Basophils Absolute: 0 10*3/uL (ref 0.0–0.1)
Eosinophils Absolute: 0.1 10*3/uL (ref 0.0–0.7)
Eosinophils Relative: 3 %
HCT: 36 % (ref 36.0–46.0)
HEMOGLOBIN: 11.8 g/dL — AB (ref 12.0–15.0)
Lymphocytes Relative: 14 %
Lymphs Abs: 0.8 10*3/uL (ref 0.7–4.0)
MCH: 30.3 pg (ref 26.0–34.0)
MCHC: 32.8 g/dL (ref 30.0–36.0)
MCV: 92.3 fL (ref 78.0–100.0)
MONOS PCT: 15 %
Monocytes Absolute: 0.8 10*3/uL (ref 0.1–1.0)
NEUTROS ABS: 3.7 10*3/uL (ref 1.7–7.7)
NEUTROS PCT: 68 %
PLATELETS: 222 10*3/uL (ref 150–400)
RBC: 3.9 MIL/uL (ref 3.87–5.11)
RDW: 16.6 % — AB (ref 11.5–15.5)
WBC: 5.4 10*3/uL (ref 4.0–10.5)

## 2016-04-18 LAB — RENAL FUNCTION PANEL
ANION GAP: 10 (ref 5–15)
Albumin: 3.4 g/dL — ABNORMAL LOW (ref 3.5–5.0)
BUN: 37 mg/dL — ABNORMAL HIGH (ref 6–20)
CALCIUM: 9.8 mg/dL (ref 8.9–10.3)
CO2: 39 mmol/L — AB (ref 22–32)
Chloride: 86 mmol/L — ABNORMAL LOW (ref 101–111)
Creatinine, Ser: 1.47 mg/dL — ABNORMAL HIGH (ref 0.44–1.00)
GFR calc Af Amer: 42 mL/min — ABNORMAL LOW (ref >60)
GFR calc non Af Amer: 37 mL/min — ABNORMAL LOW (ref >60)
GLUCOSE: 50 mg/dL — AB (ref 65–99)
Phosphorus: 4.2 mg/dL (ref 2.5–4.6)
Potassium: 2.9 mmol/L — ABNORMAL LOW (ref 3.5–5.1)
SODIUM: 135 mmol/L (ref 135–145)

## 2016-04-18 LAB — GLUCOSE, CAPILLARY
GLUCOSE-CAPILLARY: 66 mg/dL (ref 65–99)
GLUCOSE-CAPILLARY: 134 mg/dL — AB (ref 65–99)
Glucose-Capillary: 47 mg/dL — ABNORMAL LOW (ref 65–99)
Glucose-Capillary: 56 mg/dL — ABNORMAL LOW (ref 65–99)
Glucose-Capillary: 96 mg/dL (ref 65–99)
Glucose-Capillary: 129 mg/dL — ABNORMAL HIGH (ref 65–99)

## 2016-04-18 LAB — CARBOXYHEMOGLOBIN
CARBOXYHEMOGLOBIN: 2.2 % — AB (ref 0.5–1.5)
Methemoglobin: 0.7 % (ref 0.0–1.5)
O2 Saturation: 70.3 %
Total hemoglobin: 12.6 g/dL (ref 12.0–16.0)

## 2016-04-18 MED ORDER — POTASSIUM CHLORIDE CRYS ER 20 MEQ PO TBCR
40.0000 meq | EXTENDED_RELEASE_TABLET | Freq: Three times a day (TID) | ORAL | Status: DC
  Administered 2016-04-18 (×2): 40 meq via ORAL
  Filled 2016-04-18 (×2): qty 2

## 2016-04-18 MED ORDER — POTASSIUM CHLORIDE CRYS ER 20 MEQ PO TBCR
60.0000 meq | EXTENDED_RELEASE_TABLET | Freq: Once | ORAL | Status: AC
  Administered 2016-04-18: 60 meq via ORAL
  Filled 2016-04-18: qty 3

## 2016-04-18 MED ORDER — FUROSEMIDE 10 MG/ML IJ SOLN
160.0000 mg | Freq: Two times a day (BID) | INTRAVENOUS | Status: DC
  Administered 2016-04-18 – 2016-04-21 (×6): 160 mg via INTRAVENOUS
  Filled 2016-04-18 (×8): qty 16

## 2016-04-18 MED ORDER — FUROSEMIDE 80 MG PO TABS
160.0000 mg | ORAL_TABLET | Freq: Two times a day (BID) | ORAL | Status: DC
  Administered 2016-04-18: 160 mg via ORAL
  Filled 2016-04-18: qty 2

## 2016-04-18 NOTE — Plan of Care (Signed)
Problem: Education: Goal: Knowledge of [facility] Education information/materials will improve Outcome: Progressing Patient aware of plan of care.  Medication education provided to patient on all medications administered thus far this shift.  Patient stated understanding.  Patient did complain of chronic pain this shift, PRN tylenol given, pain relieved (see flowsheet and MAR for detailed information).

## 2016-04-18 NOTE — Progress Notes (Signed)
S: Breathing better.  Up in chair but not ambulating O:BP 99/58 mmHg  Pulse 76  Temp(Src) 98.4 F (36.9 C) (Oral)  Resp 18  Ht 5\' 6"  (1.676 m)  Wt 116.62 kg (257 lb 1.6 oz)  BMI 41.52 kg/m2  SpO2 97%  Intake/Output Summary (Last 24 hours) at 04/18/16 0653 Last data filed at 04/18/16 [id number]  Gross per 24 hour  Intake 1458.54 ml  Output   6725 ml  Net -5266.46 ml   Weight change: -3.221 kg (-7 lb 1.6 oz) Gen: Awake and alert CVS: Irreg, irreg Resp: Few basilar crackles Abd:+ BS NTND Ext: 2+ edema NEURO:CNI Ox3 no asterixis   . acetaZOLAMIDE  250 mg Oral BID  . aspirin EC  81 mg Oral Daily  . atorvastatin  40 mg Oral Daily  . carvedilol  3.125 mg Oral BID WC  . enoxaparin (LOVENOX) injection  40 mg Subcutaneous Q24H  . furosemide  160 mg Intravenous TID  . glipiZIDE  10 mg Oral BID AC  . multivitamin with minerals   Oral q morning - 10a  . potassium chloride  40 mEq Oral BID  . sodium chloride flush  10-40 mL Intracatheter Q12H  . sodium chloride flush  3 mL Intravenous Q12H  . spironolactone  25 mg Oral Daily   No results found. BMET    Component Value Date/Time   NA 133* 04/17/2016 0426   K 3.3* 04/17/2016 0426   CL 87* 04/17/2016 0426   CO2 34* 04/17/2016 0426   GLUCOSE 170* 04/17/2016 0426   BUN 36* 04/17/2016 0426   CREATININE 1.62* 04/17/2016 0426   CALCIUM 9.6 04/17/2016 0426   GFRNONAA 33* 04/17/2016 0426   GFRAA 38* 04/17/2016 0426   CBC    Component Value Date/Time   WBC 5.4 04/17/2016 0950   RBC 3.90 04/17/2016 0950   HGB 11.6* 04/17/2016 0950   HCT 35.9* 04/17/2016 0950   PLT 203 04/17/2016 0950   MCV 92.1 04/17/2016 0950   MCH 29.7 04/17/2016 0950   MCHC 32.3 04/17/2016 0950   RDW 16.7* 04/17/2016 0950   LYMPHSABS 0.6* 04/17/2016 0950   MONOABS 0.7 04/17/2016 0950   EOSABS 0.1 04/17/2016 0950   BASOSABS 0.0 04/17/2016 0950     Assessment: 1. Acute on CKD 3 due to poor CO and lowish BP.  Edema improved, UO excellent.  Labs pending.   Had some RBC's in urine of ? Significance and may simply be due to foley and being on lovenox.  Will repeat at later date 2. Severe cardiomyopathy, improved 3. Mild hyperkalemia, resolved.  Now hypokalemic  Plan: 1. Await labs 2. Switch to po lasix 3. Need to increase ambulation  4. Recheck labs in AM Christine Knight T

## 2016-04-18 NOTE — Progress Notes (Signed)
Hypoglycemic Event  CBG: 47    Treatment: 15 GM carbohydrate snack  Symptoms: Hungry and Nervous/irritable  Follow-up CBG: Time:0813 CBG Result:56  Possible Reasons for Event: Inadequate meal intake  Comments/MD notified:Patient now eating breakfast and states " I feel better" will continue to check and monitor    Araceli BoucheHoneycutt, Elcie Pelster L

## 2016-04-18 NOTE — Progress Notes (Signed)
Inpatient Diabetes Program Recommendations  AACE/ADA: New Consensus Statement on Inpatient Glycemic Control (2015)  Target Ranges:  Prepandial:   less than 140 mg/dL      Peak postprandial:   less than 180 mg/dL (1-2 hours)      Critically ill patients:  140 - 180 mg/dL   Lab Results  Component Value Date   GLUCAP 96 04/18/2016   HGBA1C 8.3* 12/09/2015    Review of Glycemic Control  Inpatient Diabetes Program Recommendations:  Correction (SSI): add Novolog sensitive scale TID if needed Oral Agents: Discontinue Glipizide during hospitalization Thank you  Piedad ClimesGina Lovett Coffin MSN, RN,CDE Inpatient Diabetes Coordinator [phone] (team pager)

## 2016-04-18 NOTE — Progress Notes (Signed)
Patient ID: Christine Knight, female   DOB: [birth year], 65 y.o.   MRN: [id number]     Advanced Heart Failure Rounding Note   Subjective:    Admitted with marked volume overload.  Milrinone started empirically on 6/3 by Dr Royann Shivers with ongoing poor UOP despite high dose diuretics.    Renal consulted 04/15/16 and switched from Lasix gtt at 30 to high dose bolus at 160 mg IV q 6 hrs.   Diuresed well again yesterday -5L. Weight down another 7 pounds. Breathing better. CVP still 17. Co-ox 70% on milrinone 0.125. Feels tight. Breathing better   Creatinine stable to improved. K 2.9  Objective:   Weight Range:  Vital Signs:   Temp:  [98.1 F (36.7 C)-99.2 F (37.3 C)] 98.4 F (36.9 C) (06/08 0530) Pulse Rate:  [61-101] 76 (06/08 0530) Resp:  [18] 18 (06/08 0530) BP: (99-102)/(58-67) 99/58 mmHg (06/08 0530) SpO2:  [96 %-98 %] 97 % (06/08 0530) Weight:  [116.62 kg (257 lb 1.6 oz)] 116.62 kg (257 lb 1.6 oz) (06/08 0530) Last BM Date: 04/17/16  Weight change: Filed Weights   04/16/16 0404 04/17/16 0607 04/18/16 0530  Weight: 124.512 kg (274 lb 8 oz) 119.84 kg (264 lb 3.2 oz) 116.62 kg (257 lb 1.6 oz)    Intake/Output:   Intake/Output Summary (Last 24 hours) at 04/18/16 0849 Last data filed at 04/18/16 0700  Gross per 24 hour  Intake 1145.2 ml  Output   5525 ml  Net -4379.8 ml     Physical Exam: CVP 17 General:  NAD. Sitting in the chair.  HEENT: normal Neck: supple. JVP to ear. Carotids 2+ bilat; no bruits. No thryomegaly or nodule noted.  Cor: PMI nondisplaced. RRR. No rubs, gallops or murmurs. Lungs: Decreased at bases, mild basilar crackles.  Abdomen: Obese, soft, non-tender, non-distended, no HSM. No bruits or masses. +BS  Extremities: no cyanosis, clubbing, rash, edema. R and LLE 3+ edema to thighs.  Neuro: alert & orientedx3, cranial nerves grossly intact. moves all 4 extremities w/o difficulty. Affect pleasant GU: Darker yellow urine in foley bag.   Telemetry: SR  PVCs 70s   Labs: Basic Metabolic Panel:  Recent Labs Lab 04/12/16 0200  04/16/16 0511 04/16/16 0945 04/16/16 1943 04/17/16 0426 04/18/16 0615  NA 137  < > 133* 132* 131* 133* 135  K 4.1  < > 2.8* 2.7* 3.4* 3.3* 2.9*  CL 101  < > 88* 89* 88* 87* 86*  CO2 29  < > 34* 33* 34* 34* 39*  GLUCOSE 109*  < > 153* 179* 210* 170* 50*  BUN 27*  < > 38* 38* 39* 36* 37*  CREATININE 1.38*  < > 1.67* 1.54* 1.67* 1.62* 1.47*  CALCIUM 9.4  < > 9.5 9.1 9.3 9.6 9.8  MG 2.2  --   --   --  2.3  --   --   PHOS  --   --  4.4  --   --  4.1 4.2  < > = values in this interval not displayed.  Liver Function Tests:  Recent Labs Lab 04/12/16 0200 04/16/16 0511 04/17/16 0426 04/18/16 0615  AST 32  --   --   --   ALT 19  --   --   --   ALKPHOS 114  --   --   --   BILITOT 1.9*  --   --   --   PROT 7.9  --   --   --   ALBUMIN 3.5  3.3* 3.4* 3.4*   No results for input(s): LIPASE, AMYLASE in the last 168 hours. No results for input(s): AMMONIA in the last 168 hours.  CBC:  Recent Labs Lab 04/11/16 1929 04/12/16 0200 04/17/16 0950 04/18/16 0615  WBC 4.7 4.5 5.4 5.4  NEUTROABS  --   --  3.9 3.7  HGB 13.8 12.8 11.6* 11.8*  HCT 43.1 40.6 35.9* 36.0  MCV 96.2 94.9 92.1 92.3  PLT 218 217 203 222    Cardiac Enzymes:  Recent Labs Lab 04/12/16 0200 04/12/16 0634 04/12/16 1252  TROPONINI 0.05* 0.05* 0.05*    BNP: BNP (last 3 results)  Recent Labs  12/08/15 0922 01/04/16 1139 04/11/16 1929  BNP 1155.5* 513.3* 1176.5*    ProBNP (last 3 results) No results for input(s): PROBNP in the last 8760 hours.    Other results:  Imaging: No results found.   Medications:     Scheduled Medications: . acetaZOLAMIDE  250 mg Oral BID  . aspirin EC  81 mg Oral Daily  . atorvastatin  40 mg Oral Daily  . carvedilol  3.125 mg Oral BID WC  . enoxaparin (LOVENOX) injection  40 mg Subcutaneous Q24H  . furosemide  160 mg Oral BID  . glipiZIDE  10 mg Oral BID AC  . multivitamin with  minerals   Oral q morning - 10a  . potassium chloride  40 mEq Oral BID  . sodium chloride flush  10-40 mL Intracatheter Q12H  . sodium chloride flush  3 mL Intravenous Q12H  . spironolactone  25 mg Oral Daily    Infusions: . milrinone 0.125 mcg/kg/min (04/17/16 2018)    PRN Medications: sodium chloride, acetaminophen, dextromethorphan-guaiFENesin, ondansetron (ZOFRAN) IV, sodium chloride flush, sodium chloride flush, technetium TC 49M diethylenetriame-pentaacetic acid   Assessment/Plan/Discussion   Christine Knight is a 65 yo woman with PMH of ischemic cardiomyopathy and chronic systolic heart failure with last EF ~ 15-20%, CABG 08/2011, morbid obesity, pulmonary hypertension (PA pressure 72 mmHg), poorly controlled T2DM.   1. Acute on chronic systolic heart failure: Ischemic CMP, EF 10-15%.  She was admitted with marked volume overload and remains markedly volume overloaded.  Got PICC on 6/3 and milrinone was started.  Diuresis has much improved. Creatinine stable to improved. She was difficult to diurese last admission as well.  - Continue milrinone 0.25. Will try and wean once adequately diuresed.  Coox stable.   - Continue to hold losartan for now with rise in creatinine and soft BP.  - Continue low dose Coreg.  - Continue Lasix per renal.  If pt stops responding understands she may need trial of HD.  - Continue spiro 25 mg daily. Follow creatinine closely.  2. CAD: Stable disease on recent angiogram 2/17. No evidence for ACS.  3. CKD: Stage III.  Will need to follow closely on current diuretic regimen. \ 4. DM2: CBG low today. Insulin adjusted  Continues to diuresis well with renal dosing.  Supp K. Continue spiro. Continue to follow daily Renal function panels. Dr. Briant CedarMattingly thinking of transitioning to po lasix today to assess response but given amount of volume left I discussed with him and we will continue IV lasix at least one more day to get gut edema down. (Recent d/c weight was  237 and she is still 257 today). Continue milrinone until fully diuresed and then wean.    Recurrent bouts of intractable volume overload worrisome for her overall prognosis.  Not sure how she would tolerate HD with her low EF.  She has improved for now, so will continue to address changes as needed.    Length of Stay: 7  Arvilla Meres, MD  04/18/2016, 8:49 AM  Advanced Heart Failure Team Pager [phone] (M-F; 7a - 4p)  Please contact [facility] for night-coverage after hours (4p -7a ) and weekends on [email or web address]

## 2016-04-19 LAB — CBC WITH DIFFERENTIAL/PLATELET
Basophils Absolute: 0 10*3/uL (ref 0.0–0.1)
Basophils Relative: 1 %
EOS ABS: 0.2 10*3/uL (ref 0.0–0.7)
Eosinophils Relative: 3 %
HCT: 36.9 % (ref 36.0–46.0)
HEMOGLOBIN: 11.9 g/dL — AB (ref 12.0–15.0)
LYMPHS ABS: 0.9 10*3/uL (ref 0.7–4.0)
Lymphocytes Relative: 14 %
MCH: 30.4 pg (ref 26.0–34.0)
MCHC: 32.2 g/dL (ref 30.0–36.0)
MCV: 94.4 fL (ref 78.0–100.0)
Monocytes Absolute: 0.8 10*3/uL (ref 0.1–1.0)
Monocytes Relative: 13 %
NEUTROS PCT: 70 %
Neutro Abs: 4.4 10*3/uL (ref 1.7–7.7)
Platelets: 219 10*3/uL (ref 150–400)
RBC: 3.91 MIL/uL (ref 3.87–5.11)
RDW: 16.6 % — ABNORMAL HIGH (ref 11.5–15.5)
WBC: 6.2 10*3/uL (ref 4.0–10.5)

## 2016-04-19 LAB — RENAL FUNCTION PANEL
Albumin: 3.4 g/dL — ABNORMAL LOW (ref 3.5–5.0)
Anion gap: 10 (ref 5–15)
BUN: 38 mg/dL — AB (ref 6–20)
CHLORIDE: 85 mmol/L — AB (ref 101–111)
CO2: 40 mmol/L — AB (ref 22–32)
CREATININE: 1.54 mg/dL — AB (ref 0.44–1.00)
Calcium: 9.6 mg/dL (ref 8.9–10.3)
GFR calc non Af Amer: 35 mL/min — ABNORMAL LOW (ref >60)
GFR, EST AFRICAN AMERICAN: 40 mL/min — AB (ref >60)
GLUCOSE: 69 mg/dL (ref 65–99)
Phosphorus: 4.4 mg/dL (ref 2.5–4.6)
Potassium: 3.2 mmol/L — ABNORMAL LOW (ref 3.5–5.1)
Sodium: 135 mmol/L (ref 135–145)

## 2016-04-19 LAB — GLUCOSE, CAPILLARY
GLUCOSE-CAPILLARY: 70 mg/dL (ref 65–99)
GLUCOSE-CAPILLARY: 100 mg/dL — AB (ref 65–99)
GLUCOSE-CAPILLARY: 163 mg/dL — AB (ref 65–99)
GLUCOSE-CAPILLARY: 370 mg/dL — AB (ref 65–99)
Glucose-Capillary: 129 mg/dL — ABNORMAL HIGH (ref 65–99)
Glucose-Capillary: 360 mg/dL — ABNORMAL HIGH (ref 65–99)

## 2016-04-19 LAB — CARBOXYHEMOGLOBIN
Carboxyhemoglobin: 1.7 % — ABNORMAL HIGH (ref 0.5–1.5)
Methemoglobin: 0.8 % (ref 0.0–1.5)
O2 SAT: 84.3 %
Total hemoglobin: 11.9 g/dL — ABNORMAL LOW (ref 12.0–16.0)

## 2016-04-19 MED ORDER — POTASSIUM CHLORIDE CRYS ER 20 MEQ PO TBCR
40.0000 meq | EXTENDED_RELEASE_TABLET | Freq: Two times a day (BID) | ORAL | Status: AC
Start: 2016-04-19 — End: 2016-04-19
  Administered 2016-04-19 (×2): 40 meq via ORAL
  Filled 2016-04-19 (×2): qty 2

## 2016-04-19 MED ORDER — POTASSIUM CHLORIDE CRYS ER 20 MEQ PO TBCR
40.0000 meq | EXTENDED_RELEASE_TABLET | Freq: Once | ORAL | Status: AC
  Administered 2016-04-19: 40 meq via ORAL
  Filled 2016-04-19: qty 2

## 2016-04-19 MED ORDER — PREDNISONE 20 MG PO TABS
40.0000 mg | ORAL_TABLET | Freq: Every day | ORAL | Status: AC
  Administered 2016-04-19 – 2016-04-21 (×3): 40 mg via ORAL
  Filled 2016-04-19 (×3): qty 2

## 2016-04-19 NOTE — Progress Notes (Signed)
Patient ID: Christine BabeBarbara Knight, female   DOB: [birth year], 65 y.o.   MRN: [id number]     Advanced Heart Failure Rounding Note   Subjective:    Admitted with marked volume overload.  Milrinone started empirically on 6/3 by Dr Royann Shiversroitoru with ongoing poor UOP despite high dose diuretics.    Renal consulted 04/15/16 and switched from Lasix gtt at 30 to high dose bolus at 160 mg IV q 6 hrs.  Now at lasix 160 mg IV BID.  Renal signed off 04/19/16 with good diuresis and Cr improvement.   CVP 13-14. Much improved from upper 20s on admission.   She continues to improve symptomatically.  Denies SOB or CP. No lightheadedness or dizziness.   Out 4.2 L and down another 6 lbs on Lasix 160 mg IV BID. Creatinine stable. K 3.2  Objective:   Weight Range:  Vital Signs:   Temp:  [98 F (36.7 C)-99 F (37.2 C)] 99 F (37.2 C) (06/09 0456) Resp:  [17-19] 18 (06/09 0000) BP: (89-113)/(51-94) 98/56 mmHg (06/09 0645) SpO2:  [92 %-97 %] 92 % (06/09 0456) Weight:  [251 lb 12.8 oz (114.216 kg)] 251 lb 12.8 oz (114.216 kg) (06/09 0456) Last BM Date: 04/18/16  Weight change: Filed Weights   04/17/16 0607 04/18/16 0530 04/19/16 0456  Weight: 264 lb 3.2 oz (119.84 kg) 257 lb 1.6 oz (116.62 kg) 251 lb 12.8 oz (114.216 kg)    Intake/Output:   Intake/Output Summary (Last 24 hours) at 04/19/16 0749 Last data filed at 04/19/16 0700  Gross per 24 hour  Intake  955.2 ml  Output   4425 ml  Net -3469.8 ml     Physical Exam: CVP 13-14 General:  NAD. Sitting in the chair.  HEENT: normal Neck: supple. JVP 13-14 cm. Carotids 2+ bilat; no bruits. No thryomegaly or nodule noted.  Cor: PMI nondisplaced. RRR. No rubs, gallops or murmurs. Lungs: Decreased at basilar sounds Abdomen: Obese, soft, NT, ND, no HSM. No bruits or masses. +BS  Extremities: no cyanosis, clubbing, rash, edema. R and LLE 1-2+ edema to thighs.  Neuro: alert & orientedx3, cranial nerves grossly intact. moves all 4 extremities w/o difficulty.  Affect pleasant GU: Darker yellow urine in foley bag.   Telemetry: SR PVCs 70s   Labs: Basic Metabolic Panel:  Recent Labs Lab 04/16/16 0511 04/16/16 0945 04/16/16 1943 04/17/16 0426 04/18/16 0615 04/19/16 0420  NA 133* 132* 131* 133* 135 135  K 2.8* 2.7* 3.4* 3.3* 2.9* 3.2*  CL 88* 89* 88* 87* 86* 85*  CO2 34* 33* 34* 34* 39* 40*  GLUCOSE 153* 179* 210* 170* 50* 69  BUN 38* 38* 39* 36* 37* 38*  CREATININE 1.67* 1.54* 1.67* 1.62* 1.47* 1.54*  CALCIUM 9.5 9.1 9.3 9.6 9.8 9.6  MG  --   --  2.3  --   --   --   PHOS 4.4  --   --  4.1 4.2 4.4    Liver Function Tests:  Recent Labs Lab 04/16/16 0511 04/17/16 0426 04/18/16 0615 04/19/16 0420  ALBUMIN 3.3* 3.4* 3.4* 3.4*   No results for input(s): LIPASE, AMYLASE in the last 168 hours. No results for input(s): AMMONIA in the last 168 hours.  CBC:  Recent Labs Lab 04/17/16 0950 04/18/16 0615 04/19/16 0420  WBC 5.4 5.4 6.2  NEUTROABS 3.9 3.7 4.4  HGB 11.6* 11.8* 11.9*  HCT 35.9* 36.0 36.9  MCV 92.1 92.3 94.4  PLT 203 222 219    Cardiac Enzymes:  Recent Labs  Lab 04/12/16 1252  TROPONINI 0.05*    BNP: BNP (last 3 results)  Recent Labs  12/08/15 0922 01/04/16 1139 04/11/16 1929  BNP 1155.5* 513.3* 1176.5*    ProBNP (last 3 results) No results for input(s): PROBNP in the last 8760 hours.    Other results:  Imaging: No results found.   Medications:     Scheduled Medications: . acetaZOLAMIDE  250 mg Oral BID  . aspirin EC  81 mg Oral Daily  . atorvastatin  40 mg Oral Daily  . carvedilol  3.125 mg Oral BID WC  . enoxaparin (LOVENOX) injection  40 mg Subcutaneous Q24H  . furosemide  160 mg Intravenous BID  . glipiZIDE  10 mg Oral BID AC  . multivitamin with minerals   Oral q morning - 10a  . potassium chloride  40 mEq Oral BID  . sodium chloride flush  10-40 mL Intracatheter Q12H  . sodium chloride flush  3 mL Intravenous Q12H  . spironolactone  25 mg Oral Daily    Infusions: .  milrinone 0.125 mcg/kg/min (04/18/16 2037)    PRN Medications: sodium chloride, acetaminophen, dextromethorphan-guaiFENesin, ondansetron (ZOFRAN) IV, sodium chloride flush, sodium chloride flush, technetium TC 67M diethylenetriame-pentaacetic acid   Assessment/Plan/Discussion   Ms. Summa is a 65 yo woman with PMH of ischemic cardiomyopathy and chronic systolic heart failure with last EF ~ 15-20%, CABG 08/2011, morbid obesity, pulmonary hypertension (PA pressure 72 mmHg), poorly controlled T2DM.   1. Acute on chronic systolic heart failure: Ischemic CMP, EF 10-15%.  She was admitted with marked volume overload and remains markedly volume overloaded.  Got PICC on 6/3 and milrinone was started.  Diuresis has much improved. Creatinine stable to improved. She was difficult to diurese last admission as well.  - Continue milrinone 0.25. Will try and wean once adequately diuresed.  Coox stable.   - Continue to hold losartan for now with rise in creatinine and soft BP.  - Continue low dose Coreg.  - Continue Lasix per renal.  If pt stops responding understands she may need trial of HD.  - Continue spiro 25 mg daily. Follow creatinine closely.  2. CAD: Stable disease on recent angiogram 2/17. No evidence for ACS.  3. CKD: Stage III.  Will need to follow closely on current diuretic regimen.   Continues to diuresis well with renal dosing.  Supp K. Continue spiro. Continue to follow daily Renal function panels.   Dr. Briant Cedar had discussed changing to po medications yesterday to gauge response.  With CVP 13-14, will continue IV lasix today and consider switching to torsemide 80 qam/40 qpm (had been on 40 mg BID at home) in am to gauge response over weekend.   Recurrent bouts of intractable volume overload worrisome for her overall prognosis.  Not sure how she would tolerate HD with her low EF. She has improved for now, so will continue to address changes as needed.    Length of Stay: [address]  [employer]ane [employer] 04/19/2016, 7:49 AM  Advanced Heart Failure Team Pager [phone] (M-F; 7a - 4p)  Please contact [facility] for night-coverage after hours (4p -7a ) and weekends on [email or web address]  Patient seen with PA, agree with the above note.  CVP still high, still quite volume overloaded on exam.  Creatinine coming down. I think she ideally would have a couple more days of aggressive diuresis.  Long-term will be difficult to manage and may end up requiring HD for management though not sure she will tolerate  with low EF.   Marca Ancona 04/19/2016

## 2016-04-19 NOTE — Progress Notes (Signed)
Inpatient Diabetes Program Recommendations  AACE/ADA: New Consensus Statement on Inpatient Glycemic Control (2015)  Target Ranges:  Prepandial:   less than 140 mg/dL      Peak postprandial:   less than 180 mg/dL (1-2 hours)      Critically ill patients:  140 - 180 mg/dL   Lab Results  Component Value Date   GLUCAP 100* 04/19/2016   HGBA1C 8.3* 12/09/2015    Review of Glycemic Control  Pt continues to have hypoglycemic episodes.  Glipizide is likely culprit especially with decreased renal function.  Called heart failure team and asked to discontinue it.  Inpatient Diabetes Program Recommendations:  Correction (SSI): add Novolog sensitive scale TID if needed Oral Agents: Discontinue Glipizide during hospitalization Thank you  Piedad ClimesGina Keshanna Riso MSN, RN,CDE Inpatient Diabetes Coordinator [phone] (team pager)

## 2016-04-19 NOTE — Progress Notes (Addendum)
   With episodes of hypoglycemia, diabetes coordinator recommends discontinuing glipizide.  Orders placed.  Pt also having bilateral leg pains, with history of gout will start short course prednisone for acute gout flare.   Christine NeedleMichael [name]Christine" [name], PA-C 04/19/2016 10:47 AM

## 2016-04-19 NOTE — Progress Notes (Signed)
S: Did not sleep much last night.  Up in chair but not ambulating O:BP 113/94 mmHg  Pulse 76  Temp(Src) 99 F (37.2 C) (Oral)  Resp 18  Ht 5\' 6"  (1.676 m)  Wt 114.216 kg (251 lb 12.8 oz)  BMI 40.66 kg/m2  SpO2 92%  Intake/Output Summary (Last 24 hours) at 04/19/16 0711 Last data filed at 04/19/16 0650  Gross per 24 hour  Intake    780 ml  Output   4425 ml  Net  -3645 ml   Weight change: -2.404 kg (-5 lb 4.8 oz) Gen: Awake and alert CVS: Irreg, irreg Resp: Few basilar crackles Abd:+ BS NTND Ext: 2+ edema NEURO:CNI Ox3 no asterixis   . acetaZOLAMIDE  250 mg Oral BID  . aspirin EC  81 mg Oral Daily  . atorvastatin  40 mg Oral Daily  . carvedilol  3.125 mg Oral BID WC  . enoxaparin (LOVENOX) injection  40 mg Subcutaneous Q24H  . furosemide  160 mg Intravenous BID  . glipiZIDE  10 mg Oral BID AC  . multivitamin with minerals   Oral q morning - 10a  . potassium chloride  40 mEq Oral TID  . sodium chloride flush  10-40 mL Intracatheter Q12H  . sodium chloride flush  3 mL Intravenous Q12H  . spironolactone  25 mg Oral Daily   No results found. BMET    Component Value Date/Time   NA 135 04/19/2016 0420   K 3.2* 04/19/2016 0420   CL 85* 04/19/2016 0420   CO2 40* 04/19/2016 0420   GLUCOSE 69 04/19/2016 0420   BUN 38* 04/19/2016 0420   CREATININE 1.54* 04/19/2016 0420   CALCIUM 9.6 04/19/2016 0420   GFRNONAA 35* 04/19/2016 0420   GFRAA 40* 04/19/2016 0420   CBC    Component Value Date/Time   WBC 6.2 04/19/2016 0420   RBC 3.91 04/19/2016 0420   HGB 11.9* 04/19/2016 0420   HCT 36.9 04/19/2016 0420   PLT 219 04/19/2016 0420   MCV 94.4 04/19/2016 0420   MCH 30.4 04/19/2016 0420   MCHC 32.2 04/19/2016 0420   RDW 16.6* 04/19/2016 0420   LYMPHSABS 0.9 04/19/2016 0420   MONOABS 0.8 04/19/2016 0420   EOSABS 0.2 04/19/2016 0420   BASOSABS 0.0 04/19/2016 0420     Assessment: 1. Acute on CKD 3 due to poor CO and lowish BP.  Edema improving, UO excellent, renal fx   2. Severe cardiomyopathy 3. Mild hyperkalemia, resolved.  Now hypokalemic  Plan: 1. I am adding little at this time.  She is diuresing and renal fx stable.  I will sign off but call back if further renal issues 2. Replace K Christine Knight

## 2016-04-20 LAB — RENAL FUNCTION PANEL
ALBUMIN: 3.3 g/dL — AB (ref 3.5–5.0)
ANION GAP: 10 (ref 5–15)
BUN: 47 mg/dL — AB (ref 6–20)
CALCIUM: 9.4 mg/dL (ref 8.9–10.3)
CO2: 35 mmol/L — ABNORMAL HIGH (ref 22–32)
Chloride: 87 mmol/L — ABNORMAL LOW (ref 101–111)
Creatinine, Ser: 1.61 mg/dL — ABNORMAL HIGH (ref 0.44–1.00)
GFR calc Af Amer: 38 mL/min — ABNORMAL LOW (ref >60)
GFR, EST NON AFRICAN AMERICAN: 33 mL/min — AB (ref >60)
Glucose, Bld: 308 mg/dL — ABNORMAL HIGH (ref 65–99)
POTASSIUM: 3.7 mmol/L (ref 3.5–5.1)
Phosphorus: 4.1 mg/dL (ref 2.5–4.6)
SODIUM: 132 mmol/L — AB (ref 135–145)

## 2016-04-20 LAB — CBC WITH DIFFERENTIAL/PLATELET
BASOS ABS: 0 10*3/uL (ref 0.0–0.1)
Basophils Relative: 0 %
EOS PCT: 0 %
Eosinophils Absolute: 0 10*3/uL (ref 0.0–0.7)
HEMATOCRIT: 36.3 % (ref 36.0–46.0)
HEMOGLOBIN: 11.6 g/dL — AB (ref 12.0–15.0)
LYMPHS ABS: 0.4 10*3/uL — AB (ref 0.7–4.0)
LYMPHS PCT: 9 %
MCH: 29.7 pg (ref 26.0–34.0)
MCHC: 32 g/dL (ref 30.0–36.0)
MCV: 92.8 fL (ref 78.0–100.0)
Monocytes Absolute: 0.6 10*3/uL (ref 0.1–1.0)
Monocytes Relative: 12 %
NEUTROS ABS: 3.8 10*3/uL (ref 1.7–7.7)
Neutrophils Relative %: 79 %
Platelets: 241 10*3/uL (ref 150–400)
RBC: 3.91 MIL/uL (ref 3.87–5.11)
RDW: 16.1 % — ABNORMAL HIGH (ref 11.5–15.5)
WBC: 4.8 10*3/uL (ref 4.0–10.5)

## 2016-04-20 LAB — CARBOXYHEMOGLOBIN
Carboxyhemoglobin: 1.7 % — ABNORMAL HIGH (ref 0.5–1.5)
Methemoglobin: 0.8 % (ref 0.0–1.5)
O2 Saturation: 68.7 %
TOTAL HEMOGLOBIN: 12.5 g/dL (ref 12.0–16.0)

## 2016-04-20 LAB — GLUCOSE, CAPILLARY
GLUCOSE-CAPILLARY: 238 mg/dL — AB (ref 65–99)
Glucose-Capillary: 324 mg/dL — ABNORMAL HIGH (ref 65–99)
Glucose-Capillary: 309 mg/dL — ABNORMAL HIGH (ref 65–99)
Glucose-Capillary: 216 mg/dL — ABNORMAL HIGH (ref 65–99)

## 2016-04-20 MED ORDER — METOLAZONE 5 MG PO TABS
5.0000 mg | ORAL_TABLET | Freq: Two times a day (BID) | ORAL | Status: DC
  Administered 2016-04-20 – 2016-04-24 (×9): 5 mg via ORAL
  Filled 2016-04-20 (×9): qty 1

## 2016-04-20 MED ORDER — INSULIN ASPART 100 UNIT/ML ~~LOC~~ SOLN
0.0000 [IU] | Freq: Every day | SUBCUTANEOUS | Status: DC
  Administered 2016-04-20: 2 [IU] via SUBCUTANEOUS
  Administered 2016-04-21: 4 [IU] via SUBCUTANEOUS

## 2016-04-20 MED ORDER — INSULIN ASPART 100 UNIT/ML ~~LOC~~ SOLN
0.0000 [IU] | Freq: Three times a day (TID) | SUBCUTANEOUS | Status: DC
  Administered 2016-04-20: 15 [IU] via SUBCUTANEOUS
  Administered 2016-04-20: 7 [IU] via SUBCUTANEOUS
  Administered 2016-04-21: 15 [IU] via SUBCUTANEOUS
  Administered 2016-04-21 – 2016-04-22 (×4): 4 [IU] via SUBCUTANEOUS
  Administered 2016-04-22: 11 [IU] via SUBCUTANEOUS
  Administered 2016-04-23: 4 [IU] via SUBCUTANEOUS
  Administered 2016-04-23: 15 [IU] via SUBCUTANEOUS
  Administered 2016-04-23 – 2016-04-24 (×2): 7 [IU] via SUBCUTANEOUS
  Administered 2016-04-24: 4 [IU] via SUBCUTANEOUS
  Administered 2016-04-24: 3 [IU] via SUBCUTANEOUS
  Administered 2016-04-25: 4 [IU] via SUBCUTANEOUS
  Administered 2016-04-25 (×2): 3 [IU] via SUBCUTANEOUS
  Administered 2016-04-26 (×2): 4 [IU] via SUBCUTANEOUS

## 2016-04-20 NOTE — Progress Notes (Addendum)
Patient ID: Christine Knight, female   DOB: [birth year], 65 y.o.   MRN: [id number]     Advanced Heart Failure Rounding Note   Subjective:    Admitted with marked volume overload.  Milrinone started empirically on 6/3 by Dr Royann Shivers with ongoing poor UOP despite high dose diuretics.    Renal consulted 04/15/16 and switched from Lasix gtt at 30 to high dose bolus at 160 mg IV q 6 hrs.  Now at lasix 160 mg IV BID.  Renal signed off 04/19/16 with good diuresis and Cr improvement.   Continue to diurese but feels "tighter" today and CVP back up to 18. Denies dyspnea. Creatinine climbing back up slightly. Co-ox 69% on milrinone.   Prednisone started 6/9 for gout. Now sugars up.   Objective:   Weight Range:  Vital Signs:   Temp:  [97.9 F (36.6 C)-99.1 F (37.3 C)] 98.6 F (37 C) (06/10 0728) Pulse Rate:  [57-77] 57 (06/10 0728) Resp:  [15-22] 15 (06/10 0743) BP: (98-115)/(43-72) 98/53 mmHg (06/10 0728) SpO2:  [92 %-97 %] 93 % (06/10 0728) Weight:  [112.401 kg (247 lb 12.8 oz)] 112.401 kg (247 lb 12.8 oz) (06/10 0500) Last BM Date: 04/18/16  Weight change: Filed Weights   04/18/16 0530 04/19/16 0456 04/20/16 0500  Weight: 116.62 kg (257 lb 1.6 oz) 114.216 kg (251 lb 12.8 oz) 112.401 kg (247 lb 12.8 oz)    Intake/Output:   Intake/Output Summary (Last 24 hours) at 04/20/16 0902 Last data filed at 04/20/16 0500  Gross per 24 hour  Intake   1140 ml  Output   3050 ml  Net  -1910 ml     Physical Exam: CVP 18 General:  NAD. Sitting in the chair.  HEENT: normal Neck: supple. JVP ear  Carotids 2+ bilat; no bruits. No thryomegaly or nodule noted.  Cor: PMI laterally displaced. RRR. No rubs, gallops or murmurs. Lungs: Decreased at basilar sounds Abdomen: Obese, soft, NT, ND, no HSM. No bruits or masses. +BS  Extremities: no cyanosis, clubbing, rash, edema. R and LLE 3-4 edema to thighs.  Neuro: alert & orientedx3, cranial nerves grossly intact. moves all 4 extremities w/o difficulty.  Affect pleasant   Telemetry: SR PVCs 70s   Labs: Basic Metabolic Panel:  Recent Labs Lab 04/16/16 0511  04/16/16 1943 04/17/16 0426 04/18/16 0615 04/19/16 0420 04/20/16 0600  NA 133*  < > 131* 133* 135 135 132*  K 2.8*  < > 3.4* 3.3* 2.9* 3.2* 3.7  CL 88*  < > 88* 87* 86* 85* 87*  CO2 34*  < > 34* 34* 39* 40* 35*  GLUCOSE 153*  < > 210* 170* 50* 69 308*  BUN 38*  < > 39* 36* 37* 38* 47*  CREATININE 1.67*  < > 1.67* 1.62* 1.47* 1.54* 1.61*  CALCIUM 9.5  < > 9.3 9.6 9.8 9.6 9.4  MG  --   --  2.3  --   --   --   --   PHOS 4.4  --   --  4.1 4.2 4.4 4.1  < > = values in this interval not displayed.  Liver Function Tests:  Recent Labs Lab 04/16/16 0511 04/17/16 0426 04/18/16 0615 04/19/16 0420 04/20/16 0600  ALBUMIN 3.3* 3.4* 3.4* 3.4* 3.3*   No results for input(s): LIPASE, AMYLASE in the last 168 hours. No results for input(s): AMMONIA in the last 168 hours.  CBC:  Recent Labs Lab 04/17/16 0950 04/18/16 0615 04/19/16 0420 04/20/16 0600  WBC 5.4  5.4 6.2 4.8  NEUTROABS 3.9 3.7 4.4 3.8  HGB 11.6* 11.8* 11.9* 11.6*  HCT 35.9* 36.0 36.9 36.3  MCV 92.1 92.3 94.4 92.8  PLT 203 222 219 241    Cardiac Enzymes: No results for input(s): CKTOTAL, CKMB, CKMBINDEX, TROPONINI in the last 168 hours.  BNP: BNP (last 3 results)  Recent Labs  12/08/15 0922 01/04/16 1139 04/11/16 1929  BNP 1155.5* 513.3* 1176.5*    ProBNP (last 3 results) No results for input(s): PROBNP in the last 8760 hours.    Other results:  Imaging: No results found.   Medications:     Scheduled Medications: . acetaZOLAMIDE  250 mg Oral BID  . aspirin EC  81 mg Oral Daily  . atorvastatin  40 mg Oral Daily  . carvedilol  3.125 mg Oral BID WC  . enoxaparin (LOVENOX) injection  40 mg Subcutaneous Q24H  . furosemide  160 mg Intravenous BID  . multivitamin with minerals   Oral q morning - 10a  . predniSONE  40 mg Oral Q breakfast  . sodium chloride flush  10-40 mL Intracatheter  Q12H  . sodium chloride flush  3 mL Intravenous Q12H  . spironolactone  25 mg Oral Daily    Infusions: . milrinone 0.125 mcg/kg/min (04/19/16 2103)    PRN Medications: sodium chloride, acetaminophen, dextromethorphan-guaiFENesin, ondansetron (ZOFRAN) IV, sodium chloride flush, sodium chloride flush, technetium TC 73M diethylenetriame-pentaacetic acid   Assessment/Plan/Discussion   Ms. Falter is a 65 yo woman with PMH of ischemic cardiomyopathy and chronic systolic heart failure with last EF ~ 15-20%, CABG 08/2011, morbid obesity, pulmonary hypertension (PA pressure 72 mmHg), poorly controlled T2DM.   1. Acute on chronic systolic heart failure: Ischemic CMP, EF 10-15%.  She was admitted with marked volume overload and remains markedly volume overloaded.  Got PICC on 6/3 and milrinone was started.  Diuresis has much improved. Creatinine stable to improved. She was difficult to diurese last admission as well.  - Continues to diurese. Weight down but CVP up again and tighter on exam. Will add back metolazone. Continue lasix 160 IV bid - Continue milrinone 0.25. Will try and wean once adequately diuresed.  Coox stable.   - Continue to hold losartan for now with rise in creatinine and soft BP.  - Continue low dose Coreg.  - Continue spiro 25 mg daily. Follow creatinine closely.  - Long-term will be difficult to manage and may end up requiring HD for management though not sure she will tolerate with low EF.  2. CAD: Stable disease on recent angiogram 2/17. No evidence for ACS.  3. CKD: Stage III-IV.  Will need to follow closely on current diuretic regimen.  4. DM2:  -sugars back up in setting of prednisone for gout adjust insulin as needed.  5. Gout - prednisone started 6/9 x 3 doses  Length of Stay: 9  Arvilla MeresBensimhon, Daniel, MD  04/20/2016, 9:02 AM  Advanced Heart Failure Team Pager [phone] (M-F; 7a - 4p)  Please contact [facility] for night-coverage after hours (4p -7a ) and  weekends on [email or web address]

## 2016-04-21 LAB — RENAL FUNCTION PANEL
ALBUMIN: 3.5 g/dL (ref 3.5–5.0)
ANION GAP: 10 (ref 5–15)
BUN: 57 mg/dL — AB (ref 6–20)
CALCIUM: 9.6 mg/dL (ref 8.9–10.3)
CO2: 37 mmol/L — ABNORMAL HIGH (ref 22–32)
CREATININE: 1.64 mg/dL — AB (ref 0.44–1.00)
Chloride: 86 mmol/L — ABNORMAL LOW (ref 101–111)
GFR calc Af Amer: 37 mL/min — ABNORMAL LOW (ref >60)
GFR calc non Af Amer: 32 mL/min — ABNORMAL LOW (ref >60)
GLUCOSE: 188 mg/dL — AB (ref 65–99)
PHOSPHORUS: 3.4 mg/dL (ref 2.5–4.6)
Potassium: 2.9 mmol/L — ABNORMAL LOW (ref 3.5–5.1)
SODIUM: 133 mmol/L — AB (ref 135–145)

## 2016-04-21 LAB — CARBOXYHEMOGLOBIN
Carboxyhemoglobin: 2.2 % — ABNORMAL HIGH (ref 0.5–1.5)
Methemoglobin: 0.5 % (ref 0.0–1.5)
O2 Saturation: 92.8 %
Total hemoglobin: 12.2 g/dL (ref 12.0–16.0)

## 2016-04-21 LAB — GLUCOSE, CAPILLARY
GLUCOSE-CAPILLARY: 307 mg/dL — AB (ref 65–99)
Glucose-Capillary: 162 mg/dL — ABNORMAL HIGH (ref 65–99)
Glucose-Capillary: 161 mg/dL — ABNORMAL HIGH (ref 65–99)
Glucose-Capillary: 306 mg/dL — ABNORMAL HIGH (ref 65–99)

## 2016-04-21 LAB — CBC WITH DIFFERENTIAL/PLATELET
Basophils Absolute: 0 10*3/uL (ref 0.0–0.1)
Basophils Relative: 0 %
Eosinophils Absolute: 0 10*3/uL (ref 0.0–0.7)
Eosinophils Relative: 0 %
HEMATOCRIT: 36.7 % (ref 36.0–46.0)
Hemoglobin: 11.6 g/dL — ABNORMAL LOW (ref 12.0–15.0)
LYMPHS PCT: 12 %
Lymphs Abs: 0.8 10*3/uL (ref 0.7–4.0)
MCH: 29.4 pg (ref 26.0–34.0)
MCHC: 31.6 g/dL (ref 30.0–36.0)
MCV: 92.9 fL (ref 78.0–100.0)
MONO ABS: 0.5 10*3/uL (ref 0.1–1.0)
MONOS PCT: 8 %
NEUTROS ABS: 5.3 10*3/uL (ref 1.7–7.7)
Neutrophils Relative %: 80 %
Platelets: 271 10*3/uL (ref 150–400)
RBC: 3.95 MIL/uL (ref 3.87–5.11)
RDW: 15.9 % — AB (ref 11.5–15.5)
WBC: 6.6 10*3/uL (ref 4.0–10.5)

## 2016-04-21 MED ORDER — POTASSIUM CHLORIDE CRYS ER 20 MEQ PO TBCR
40.0000 meq | EXTENDED_RELEASE_TABLET | Freq: Once | ORAL | Status: AC
  Administered 2016-04-21: 40 meq via ORAL
  Filled 2016-04-21: qty 2

## 2016-04-21 MED ORDER — POTASSIUM CHLORIDE CRYS ER 20 MEQ PO TBCR
40.0000 meq | EXTENDED_RELEASE_TABLET | Freq: Two times a day (BID) | ORAL | Status: DC
  Administered 2016-04-21 – 2016-04-26 (×11): 40 meq via ORAL
  Filled 2016-04-21 (×11): qty 2

## 2016-04-21 MED ORDER — FUROSEMIDE 10 MG/ML IJ SOLN
160.0000 mg | INTRAVENOUS | Status: DC
  Administered 2016-04-21 – 2016-04-23 (×8): 160 mg via INTRAVENOUS
  Filled 2016-04-21 (×11): qty 16

## 2016-04-21 NOTE — Progress Notes (Signed)
Patient ID: Christine Knight, female   DOB: 1951-11-03, 65 y.o.   MRN: [id number]     Advanced Heart Failure Rounding Note   Subjective:    Admitted with marked volume overload.  Milrinone started empirically on 6/3 by Dr Royann Shivers with ongoing poor UOP despite high dose diuretics.    Renal consulted 04/15/16 and switched from Lasix gtt at 30 to high dose bolus at 160 mg IV q 6 hrs.  Now at lasix 160 mg IV BID.  Renal signed off 04/19/16 with good diuresis and Cr improvement.   Metolazone added back yesterday. Weight down 2 pounds but weight still up. Remains on milrinone (co-ox inaccurate). K 2.9. Renal function stable.   Prednisone started 6/9 for gout today is day 3/3. Insulin adjusted yesterday.   Objective:   Weight Range:  Vital Signs:   Temp:  [98.2 F (36.8 C)-98.9 F (37.2 C)] 98.2 F (36.8 C) (06/11 0754) Pulse Rate:  [73-109] 79 (06/11 0754) Resp:  [15-21] 19 (06/11 0754) BP: (103-120)/(55-69) 117/69 mmHg (06/11 0754) SpO2:  [90 %-98 %] 97 % (06/11 0754) Weight:  [111.676 kg (246 lb 3.2 oz)] 111.676 kg (246 lb 3.2 oz) (06/11 0500) Last BM Date: 04/20/16  Weight change: Filed Weights   04/19/16 0456 04/20/16 0500 04/21/16 0500  Weight: 114.216 kg (251 lb 12.8 oz) 112.401 kg (247 lb 12.8 oz) 111.676 kg (246 lb 3.2 oz)    Intake/Output:   Intake/Output Summary (Last 24 hours) at 04/21/16 0840 Last data filed at 04/21/16 0500  Gross per 24 hour  Intake    565 ml  Output   2700 ml  Net  -2135 ml     Physical Exam: CVP 19 General:  NAD. Sitting in the chair.  HEENT: normal Neck: supple. JVP ear  Carotids 2+ bilat; no bruits. No thryomegaly or nodule noted.  Cor: PMI laterally displaced. RRR. No rubs, gallops or murmurs. Lungs: Decreased at basilar sounds Abdomen: Obese, soft, NT, ND, no HSM. No bruits or masses. +BS  Extremities: no cyanosis, clubbing, rash, edema. R and LLE 3+ edema to thighs.  Neuro: alert & orientedx3, cranial nerves grossly intact. moves  all 4 extremities w/o difficulty. Affect pleasant   Telemetry: SR PVCs 70s   Labs: Basic Metabolic Panel:  Recent Labs Lab 04/16/16 1943 04/17/16 0426 04/18/16 0615 04/19/16 0420 04/20/16 0600 04/21/16 0454  NA 131* 133* 135 135 132* 133*  K 3.4* 3.3* 2.9* 3.2* 3.7 2.9*  CL 88* 87* 86* 85* 87* 86*  CO2 34* 34* 39* 40* 35* 37*  GLUCOSE 210* 170* 50* 69 308* 188*  BUN 39* 36* 37* 38* 47* 57*  CREATININE 1.67* 1.62* 1.47* 1.54* 1.61* 1.64*  CALCIUM 9.3 9.6 9.8 9.6 9.4 9.6  MG 2.3  --   --   --   --   --   PHOS  --  4.1 4.2 4.4 4.1 3.4    Liver Function Tests:  Recent Labs Lab 04/17/16 0426 04/18/16 0615 04/19/16 0420 04/20/16 0600 04/21/16 0454  ALBUMIN 3.4* 3.4* 3.4* 3.3* 3.5   No results for input(s): LIPASE, AMYLASE in the last 168 hours. No results for input(s): AMMONIA in the last 168 hours.  CBC:  Recent Labs Lab 04/17/16 0950 04/18/16 0615 04/19/16 0420 04/20/16 0600 04/21/16 0454  WBC 5.4 5.4 6.2 4.8 6.6  NEUTROABS 3.9 3.7 4.4 3.8 5.3  HGB 11.6* 11.8* 11.9* 11.6* 11.6*  HCT 35.9* 36.0 36.9 36.3 36.7  MCV 92.1 92.3 94.4 92.8 92.9  PLT  203 222 219 241 271    Cardiac Enzymes: No results for input(s): CKTOTAL, CKMB, CKMBINDEX, TROPONINI in the last 168 hours.  BNP: BNP (last 3 results)  Recent Labs  12/08/15 0922 01/04/16 1139 04/11/16 1929  BNP 1155.5* 513.3* 1176.5*    ProBNP (last 3 results) No results for input(s): PROBNP in the last 8760 hours.    Other results:  Imaging: No results found.   Medications:     Scheduled Medications: . acetaZOLAMIDE  250 mg Oral BID  . aspirin EC  81 mg Oral Daily  . atorvastatin  40 mg Oral Daily  . carvedilol  3.125 mg Oral BID WC  . enoxaparin (LOVENOX) injection  40 mg Subcutaneous Q24H  . furosemide  160 mg Intravenous BID  . insulin aspart  0-20 Units Subcutaneous TID WC  . insulin aspart  0-5 Units Subcutaneous QHS  . metolazone  5 mg Oral BID  . multivitamin with minerals    Oral q morning - 10a  . sodium chloride flush  10-40 mL Intracatheter Q12H  . sodium chloride flush  3 mL Intravenous Q12H  . spironolactone  25 mg Oral Daily    Infusions: . milrinone 0.125 mcg/kg/min (04/20/16 2146)    PRN Medications: sodium chloride, acetaminophen, dextromethorphan-guaiFENesin, ondansetron (ZOFRAN) IV, sodium chloride flush, sodium chloride flush, technetium TC 43M diethylenetriame-pentaacetic acid   Assessment/Plan/Discussion   Ms. 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   1. Acute on chronic systolic heart failure: Ischemic CMP, EF 10-15%.  She was admitted with marked volume overload and remains markedly volume overloaded.  Got PICC on 6/3 and milrinone was started.  Diuresis has much improved. Creatinine stable to improved. She was difficult to diurese last admission as well.  - Continues to diurese but weight moving only slowly. Continue metolazone. Increase lasix 160 IV tid - Continue milrinone 0.25. Will try and wean once adequately diuresed.  Coox stable.   - Continue to hold losartan for now with rise in creatinine and soft BP.  - Continue low dose Coreg.  - Continue spiro 25 mg daily. Follow creatinine closely.  - Long-term will be difficult to manage and may end up requiring HD for management though not sure she will tolerate with low EF.  2. CAD: Stable disease on recent angiogram 2/17. No evidence for ACS.  3. CKD: Stage III-IV.  Stable. Will need to follow closely on current diuretic regimen.  4. DM2:  -sugars back up in setting of prednisone for gout adjust insulin as needed.  5. Gout - prednisone started 6/9 x 3 doses. Today is 3/3  Length of Stay: 10  Arvilla MeresBensimhon, Zlata Alcaide, MD  04/21/2016, 8:40 AM  Advanced Heart Failure Team Pager [phone] (M-F; 7a - 4p)  Please  contact [facility] for night-coverage after hours (4p -7a ) and weekends on [email or web address]

## 2016-04-22 LAB — CBC WITH DIFFERENTIAL/PLATELET
BASOS PCT: 0 %
Basophils Absolute: 0 10*3/uL (ref 0.0–0.1)
EOS ABS: 0 10*3/uL (ref 0.0–0.7)
Eosinophils Relative: 0 %
HCT: 36.7 % (ref 36.0–46.0)
HEMOGLOBIN: 12.2 g/dL (ref 12.0–15.0)
LYMPHS ABS: 0.7 10*3/uL (ref 0.7–4.0)
Lymphocytes Relative: 11 %
MCH: 30.3 pg (ref 26.0–34.0)
MCHC: 33.2 g/dL (ref 30.0–36.0)
MCV: 91.1 fL (ref 78.0–100.0)
Monocytes Absolute: 0.7 10*3/uL (ref 0.1–1.0)
Monocytes Relative: 12 %
NEUTROS ABS: 4.9 10*3/uL (ref 1.7–7.7)
NEUTROS PCT: 77 %
Platelets: 281 10*3/uL (ref 150–400)
RBC: 4.03 MIL/uL (ref 3.87–5.11)
RDW: 15.6 % — ABNORMAL HIGH (ref 11.5–15.5)
WBC: 6.4 10*3/uL (ref 4.0–10.5)

## 2016-04-22 LAB — RENAL FUNCTION PANEL
Albumin: 3.7 g/dL (ref 3.5–5.0)
Anion gap: 11 (ref 5–15)
BUN: 64 mg/dL — ABNORMAL HIGH (ref 6–20)
CHLORIDE: 84 mmol/L — AB (ref 101–111)
CO2: 37 mmol/L — AB (ref 22–32)
CREATININE: 1.56 mg/dL — AB (ref 0.44–1.00)
Calcium: 9.9 mg/dL (ref 8.9–10.3)
GFR calc non Af Amer: 34 mL/min — ABNORMAL LOW (ref >60)
GFR, EST AFRICAN AMERICAN: 39 mL/min — AB (ref >60)
GLUCOSE: 259 mg/dL — AB (ref 65–99)
Phosphorus: 3.4 mg/dL (ref 2.5–4.6)
Potassium: 3.5 mmol/L (ref 3.5–5.1)
Sodium: 132 mmol/L — ABNORMAL LOW (ref 135–145)

## 2016-04-22 LAB — GLUCOSE, CAPILLARY
GLUCOSE-CAPILLARY: 172 mg/dL — AB (ref 65–99)
GLUCOSE-CAPILLARY: 219 mg/dL — AB (ref 65–99)
Glucose-Capillary: 251 mg/dL — ABNORMAL HIGH (ref 65–99)
Glucose-Capillary: 200 mg/dL — ABNORMAL HIGH (ref 65–99)

## 2016-04-22 LAB — CARBOXYHEMOGLOBIN
Carboxyhemoglobin: 2.1 % — ABNORMAL HIGH (ref 0.5–1.5)
Methemoglobin: 0.7 % (ref 0.0–1.5)
O2 SAT: 80.1 %
Total hemoglobin: 12.8 g/dL (ref 12.0–16.0)

## 2016-04-22 MED ORDER — MILRINONE LACTATE IN DEXTROSE 20-5 MG/100ML-% IV SOLN
0.1250 ug/kg/min | INTRAVENOUS | Status: DC
  Administered 2016-04-22 (×2): 0.125 ug/kg/min via INTRAVENOUS
  Filled 2016-04-22 (×2): qty 100

## 2016-04-22 NOTE — Progress Notes (Addendum)
Patient ID: Christine Knight, female   DOB: [birth year], 65 y.o.   MRN: [id number]     Advanced Heart Failure Rounding Note   Subjective:    Admitted with marked volume overload.  Milrinone started empirically on 6/3 by Dr Royann Shivers with ongoing poor UOP despite high dose diuretics.    Renal consulted 04/15/16 and switched from Lasix gtt at 30 to high dose bolus at 160 mg IV q 6 hrs.  Now at lasix 160 mg IV BID.  Renal signed off 04/19/16 with good diuresis and Cr improvement.   Prednisone started 6/9 for gout x 3 days. Insulin adjusted 04/20/16.  Metolazone added back 04/20/16, Lasix taken back up to TID 04/21/16.   Out 3.0 L and down another 3 lbs.  CVP remains 16-17. Remains on milrinone (co-ox inaccurate). K 3.5. Creatinine stable, though BUN trending up 47 -> 57 -> 64.  Objective:   Weight Range:  Vital Signs:   Temp:  [97.6 F (36.4 C)-98.4 F (36.9 C)] 97.8 F (36.6 C) (06/12 1236) Pulse Rate:  [68-79] 75 (06/12 1236) Resp:  [15-19] 18 (06/12 1236) BP: (106-118)/(56-76) 118/76 mmHg (06/12 1236) SpO2:  [98 %-100 %] 100 % (06/12 1236) Weight:  [243 lb (110.224 kg)] 243 lb (110.224 kg) (06/12 0500) Last BM Date: 04/20/16  Weight change: Filed Weights   04/20/16 0500 04/21/16 0500 04/22/16 0500  Weight: 247 lb 12.8 oz (112.401 kg) 246 lb 3.2 oz (111.676 kg) 243 lb (110.224 kg)    Intake/Output:   Intake/Output Summary (Last 24 hours) at 04/22/16 1456 Last data filed at 04/22/16 1300  Gross per 24 hour  Intake    931 ml  Output   4825 ml  Net  -3894 ml     Physical Exam: CVP 16-17 General:  NAD. Sitting in the chair.  HEENT: normal Neck: supple. JVP to ear  Carotids 2+ bilat; no bruits. No thryomegaly or nodule noted.  Cor: PMI laterally displaced. RRR. No M/G/R appreciated Lungs: Decreased at basilar sounds Abdomen: Obese, soft, non-tender, non-distended, no HSM. No bruits or masses. +BS  Extremities: no cyanosis, clubbing, rash, edema. R and LLE 3+ edema to thighs.   Neuro: alert & orientedx3, cranial nerves grossly intact. moves all 4 extremities w/o difficulty. Affect pleasant  Telemetry: SR PVCs 70s   Labs: Basic Metabolic Panel:  Recent Labs Lab 04/16/16 1943  04/18/16 0615 04/19/16 0420 04/20/16 0600 04/21/16 0454 04/22/16 0522  NA 131*  < > 135 135 132* 133* 132*  K 3.4*  < > 2.9* 3.2* 3.7 2.9* 3.5  CL 88*  < > 86* 85* 87* 86* 84*  CO2 34*  < > 39* 40* 35* 37* 37*  GLUCOSE 210*  < > 50* 69 308* 188* 259*  BUN 39*  < > 37* 38* 47* 57* 64*  CREATININE 1.67*  < > 1.47* 1.54* 1.61* 1.64* 1.56*  CALCIUM 9.3  < > 9.8 9.6 9.4 9.6 9.9  MG 2.3  --   --   --   --   --   --   PHOS  --   < > 4.2 4.4 4.1 3.4 3.4  < > = values in this interval not displayed.  Liver Function Tests:  Recent Labs Lab 04/18/16 0615 04/19/16 0420 04/20/16 0600 04/21/16 0454 04/22/16 0522  ALBUMIN 3.4* 3.4* 3.3* 3.5 3.7   No results for input(s): LIPASE, AMYLASE in the last 168 hours. No results for input(s): AMMONIA in the last 168 hours.  CBC:  Recent  Labs Lab 04/18/16 0615 04/19/16 0420 04/20/16 0600 04/21/16 0454 04/22/16 0523  WBC 5.4 6.2 4.8 6.6 6.4  NEUTROABS 3.7 4.4 3.8 5.3 4.9  HGB 11.8* 11.9* 11.6* 11.6* 12.2  HCT 36.0 36.9 36.3 36.7 36.7  MCV 92.3 94.4 92.8 92.9 91.1  PLT 222 219 241 271 281    Cardiac Enzymes: No results for input(s): CKTOTAL, CKMB, CKMBINDEX, TROPONINI in the last 168 hours.  BNP: BNP (last 3 results)  Recent Labs  12/08/15 0922 01/04/16 1139 04/11/16 1929  BNP 1155.5* 513.3* 1176.5*    ProBNP (last 3 results) No results for input(s): PROBNP in the last 8760 hours.    Other results:  Imaging: No results found.   Medications:     Scheduled Medications: . acetaZOLAMIDE  250 mg Oral BID  . aspirin EC  81 mg Oral Daily  . atorvastatin  40 mg Oral Daily  . carvedilol  3.125 mg Oral BID WC  . enoxaparin (LOVENOX) injection  40 mg Subcutaneous Q24H  . furosemide  160 mg Intravenous [id number]   . insulin aspart  0-20 Units Subcutaneous TID WC  . insulin aspart  0-5 Units Subcutaneous QHS  . metolazone  5 mg Oral BID  . multivitamin with minerals   Oral q morning - 10a  . potassium chloride  40 mEq Oral BID  . sodium chloride flush  10-40 mL Intracatheter Q12H  . sodium chloride flush  3 mL Intravenous Q12H  . spironolactone  25 mg Oral Daily    Infusions: . 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   1. Acute on chronic systolic heart failure: Ischemic CMP, EF 10-15%.  She was admitted with marked volume overload and remains markedly volume overloaded.  Got PICC on 6/3 and milrinone was started.  Diuresis has much improved. Creatinine stable to improved. She was difficult to diurese last admission as well.  - Continues to diurese but weight moving only slowly. Continue metolazone and lasix 160 IV tid for today.  - Continue milrinone 0.25. Will try and wean once adequately diuresed.  Coox stable.   - Continue to hold losartan for now with rise in creatinine and soft BP.  - Continue low dose Coreg.  - Continue spiro 25 mg daily. Follow creatinine closely.  - Long-term will be difficult to manage and may end up requiring HD for management though not sure she will tolerate with low EF.  2. CAD: Stable disease on recent angiogram 2/17. No evidence for ACS.  3. CKD: Stage III-IV.  - Creatinine stable but BUN trending up.  Unsure how much drier we will be able to get her.   4. DM2:  -sugars back up in setting of prednisone for  gout adjust insulin as needed. Prednisone now complete.  5. Gout - Finished short course prednisone started 6/9 x 3 doses.   BUN trending up.  Diuresis may be limited if continues to rise.  Will continue IV lasix today.  May have to switch to po tomorrow.  Will need to decide on optimal dose for home. Was taking torsemide 40 mg BID. Will likely need to watch for day or two  on po regimen while weaning milrinone.   Length of Stay: [address]  Graciella FreerMichael Andrew Tillery, [place]PA-C 04/22/2016, 2:56 PM  Advanced Heart Failure Team Pager [phone] (M-F; 7a - 4p)  Please contact [facility] for night-coverage after hours (4p -7a ) and weekends on [email or web address]  Patient seen and examined with Otilio SaberAndy Tillery, PA-C. We discussed all aspects of the encounter. I agree with the assessment and plan as stated above.   Weight trending down toward baseline buts still volume overloaded. CVP quite high. Creatinine stable. Continue IV lasix and milrinone. Potassium improved. Gout flare resolved with prednisone.   Bensimhon, Daniel,MD 6:53 PM

## 2016-04-22 NOTE — Plan of Care (Signed)
Problem: Education: Goal: Knowledge of [facility] Education information/materials will improve Outcome: Progressing Patient aware of plan of care, states understanding.  RN provided medication education to patient on all medications administered thus far this shift.  Patient has denied pain thus far this shift.  RN instructed patient to notify staff if she started to experience any pain.

## 2016-04-22 NOTE — Plan of Care (Signed)
Problem: Safety: Goal: Ability to remain free from injury will improve Outcome: Completed/Met Date Met:  04/22/16 Pt educated on safety measures put into place. Pt verbalized understanding. Call bell within reach.

## 2016-04-22 NOTE — Plan of Care (Signed)
Problem: Education: Goal: Ability to verbalize understanding of medication therapies will improve Outcome: Completed/Met Date Met:  04/22/16 Pt educated on new/current medications. Provided heart failure education. Pt verbalized understanding. No concerns at this time.

## 2016-04-23 LAB — GLUCOSE, CAPILLARY
GLUCOSE-CAPILLARY: 43 mg/dL — AB (ref 65–99)
GLUCOSE-CAPILLARY: 172 mg/dL — AB (ref 65–99)
GLUCOSE-CAPILLARY: 186 mg/dL — AB (ref 65–99)
Glucose-Capillary: 208 mg/dL — ABNORMAL HIGH (ref 65–99)
Glucose-Capillary: 305 mg/dL — ABNORMAL HIGH (ref 65–99)

## 2016-04-23 LAB — CARBOXYHEMOGLOBIN
Carboxyhemoglobin: 1.9 % — ABNORMAL HIGH (ref 0.5–1.5)
METHEMOGLOBIN: 0.7 % (ref 0.0–1.5)
O2 Saturation: 73.3 %
TOTAL HEMOGLOBIN: 13.1 g/dL (ref 12.0–16.0)

## 2016-04-23 LAB — RENAL FUNCTION PANEL
ALBUMIN: 3.6 g/dL (ref 3.5–5.0)
Anion gap: 11 (ref 5–15)
BUN: 64 mg/dL — AB (ref 6–20)
CO2: 37 mmol/L — ABNORMAL HIGH (ref 22–32)
CREATININE: 1.51 mg/dL — AB (ref 0.44–1.00)
Calcium: 9.7 mg/dL (ref 8.9–10.3)
Chloride: 84 mmol/L — ABNORMAL LOW (ref 101–111)
GFR calc Af Amer: 41 mL/min — ABNORMAL LOW (ref >60)
GFR, EST NON AFRICAN AMERICAN: 35 mL/min — AB (ref >60)
GLUCOSE: 191 mg/dL — AB (ref 65–99)
PHOSPHORUS: 3.3 mg/dL (ref 2.5–4.6)
POTASSIUM: 3 mmol/L — AB (ref 3.5–5.1)
SODIUM: 132 mmol/L — AB (ref 135–145)

## 2016-04-23 MED ORDER — INSULIN ASPART 100 UNIT/ML ~~LOC~~ SOLN
5.0000 [IU] | Freq: Three times a day (TID) | SUBCUTANEOUS | Status: DC
  Administered 2016-04-24 – 2016-04-26 (×8): 5 [IU] via SUBCUTANEOUS

## 2016-04-23 MED ORDER — POTASSIUM CHLORIDE CRYS ER 20 MEQ PO TBCR
40.0000 meq | EXTENDED_RELEASE_TABLET | Freq: Once | ORAL | Status: AC
  Administered 2016-04-23: 40 meq via ORAL
  Filled 2016-04-23: qty 2

## 2016-04-23 NOTE — Progress Notes (Signed)
Patient ID: Christine Knight, female   DOB: 16-Nov-1950, 65 y.o.   MRN: [id number]     Advanced Heart Failure Rounding Note   Subjective:    Admitted with marked volume overload.  Milrinone started empirically on 6/3 by Dr Royann Shivers with ongoing poor UOP despite high dose diuretics.    Renal consulted 04/15/16 and switched from Lasix gtt at 30 to high dose bolus at 160 mg IV q 6 hrs.  Now at lasix 160 mg IV BID.  Renal signed off 04/19/16 with good diuresis and Cr improvement.   Prednisone started 6/9 for gout x 3 days. Insulin adjusted 04/20/16.  Metolazone added back 04/20/16, Lasix taken back up to TID 04/21/16.   Continues to diurese with high dose lasix + metolazone. Complaining of fatigue. Mild dyspnea with exertion. CVP 11-12  Objective:   Weight Range:  Vital Signs:   Temp:  [97.7 F (36.5 C)-99.1 F (37.3 C)] 98.4 F (36.9 C) (06/13 1146) Pulse Rate:  [65-79] 74 (06/13 1146) Resp:  [18] 18 (06/13 0345) BP: (90-118)/(55-79) 102/62 mmHg (06/13 1146) SpO2:  [91 %-100 %] 91 % (06/13 1146) Weight:  [237 lb 6.4 oz (107.684 kg)] 237 lb 6.4 oz (107.684 kg) (06/13 0345) Last BM Date: 04/23/16  Weight change: Filed Weights   04/21/16 0500 04/22/16 0500 04/23/16 0345  Weight: 246 lb 3.2 oz (111.676 kg) 243 lb (110.224 kg) 237 lb 6.4 oz (107.684 kg)    Intake/Output:   Intake/Output Summary (Last 24 hours) at 04/23/16 1159 Last data filed at 04/23/16 0827  Gross per 24 hour  Intake 1254.9 ml  Output   3125 ml  Net -1870.1 ml     Physical Exam: CVP 11-12  General:  NAD. Sitting in the chair.  HEENT: normal Neck: supple. JVP jaw  Carotids 2+ bilat; no bruits. No thryomegaly or nodule noted.  Cor: PMI laterally displaced. RRR. No M/G/R appreciated Lungs: Decreased at basilar sounds Abdomen: Obese, soft, non-tender, non-distended, no HSM. No bruits or masses. +BS  Extremities: no cyanosis, clubbing, rash, edema. R and LLE 3+ edema  Neuro: alert & orientedx3, cranial nerves  grossly intact. moves all 4 extremities w/o difficulty. Affect pleasant  Telemetry: SR PVCs 70s   Labs: Basic Metabolic Panel:  Recent Labs Lab 04/16/16 1943  04/19/16 0420 04/20/16 0600 04/21/16 0454 04/22/16 0522 04/23/16 0517  NA 131*  < > 135 132* 133* 132* 132*  K 3.4*  < > 3.2* 3.7 2.9* 3.5 3.0*  CL 88*  < > 85* 87* 86* 84* 84*  CO2 34*  < > 40* 35* 37* 37* 37*  GLUCOSE 210*  < > 69 308* 188* 259* 191*  BUN 39*  < > 38* 47* 57* 64* 64*  CREATININE 1.67*  < > 1.54* 1.61* 1.64* 1.56* 1.51*  CALCIUM 9.3  < > 9.6 9.4 9.6 9.9 9.7  MG 2.3  --   --   --   --   --   --   PHOS  --   < > 4.4 4.1 3.4 3.4 3.3  < > = values in this interval not displayed.  Liver Function Tests:  Recent Labs Lab 04/19/16 0420 04/20/16 0600 04/21/16 0454 04/22/16 0522 04/23/16 0517  ALBUMIN 3.4* 3.3* 3.5 3.7 3.6   No results for input(s): LIPASE, AMYLASE in the last 168 hours. No results for input(s): AMMONIA in the last 168 hours.  CBC:  Recent Labs Lab 04/18/16 0615 04/19/16 0420 04/20/16 0600 04/21/16 0454 04/22/16 0523  WBC  5.4 6.2 4.8 6.6 6.4  NEUTROABS 3.7 4.4 3.8 5.3 4.9  HGB 11.8* 11.9* 11.6* 11.6* 12.2  HCT 36.0 36.9 36.3 36.7 36.7  MCV 92.3 94.4 92.8 92.9 91.1  PLT 222 219 241 271 281    Cardiac Enzymes: No results for input(s): CKTOTAL, CKMB, CKMBINDEX, TROPONINI in the last 168 hours.  BNP: BNP (last 3 results)  Recent Labs  12/08/15 0922 01/04/16 1139 04/11/16 1929  BNP 1155.5* 513.3* 1176.5*    ProBNP (last 3 results) No results for input(s): PROBNP in the last 8760 hours.    Other results:  Imaging: No results found.   Medications:     Scheduled Medications: . acetaZOLAMIDE  250 mg Oral BID  . aspirin EC  81 mg Oral Daily  . atorvastatin  40 mg Oral Daily  . carvedilol  3.125 mg Oral BID WC  . enoxaparin (LOVENOX) injection  40 mg Subcutaneous Q24H  . furosemide  160 mg Intravenous [id number]  . insulin aspart  0-20 Units Subcutaneous  TID WC  . insulin aspart  0-5 Units Subcutaneous QHS  . metolazone  5 mg Oral BID  . multivitamin with minerals   Oral q morning - 10a  . potassium chloride  40 mEq Oral BID  . potassium chloride  40 mEq Oral Once  . sodium chloride flush  10-40 mL Intracatheter Q12H  . sodium chloride flush  3 mL Intravenous Q12H  . spironolactone  25 mg Oral Daily    Infusions: . milrinone 0.125 mcg/kg/min (04/22/16 1732)    PRN Medications: sodium chloride, acetaminophen, dextromethorphan-guaiFENesin, ondansetron (ZOFRAN) IV, sodium chloride flush, sodium chloride flush, technetium TC 35M diethylenetriame-pentaacetic acid   Assessment/Plan/Discussion   Christine Knight is a 65 yo woman with PMH of ischemic cardiomyopathy and chronic systolic heart failure with last EF ~ 15-20%, CABG 08/2011, morbid obesity, pulmonary hypertension (PA pressure 72 mmHg), poorly controlled T2DM.   1. Acute on chronic systolic heart failure: Ischemic CMP, EF 10-15%.  She was admitted with marked volume overload and remains markedly volume overloaded.  Got PICC on 6/3 and milrinone was started.  Diuresis has much improved. Creatinine unchanged 1.5.  CVP 11-12. Continues to diurese with metolazone and lasix 160 IV tid for today.  - Continue milrinone 0.25. Will try and wean once adequately diuresed.  CO-OX 73%.    - Continue to hold losartan for now with rise in creatinine and soft BP.  - Continue low dose Coreg.  - Continue spiro 25 mg daily. Follow creatinine closely.  - add unna boots.  - Long-term will be difficult to manage and may end up requiring HD for management though not sure she will tolerate with low EF.  2. CAD: Stable disease on recent angiogram 2/17. No evidence for ACS.  3. CKD: Stage III-IV.  - Creatinine stable 1.51 BUN unchanged 64.    4. DM2:  -Glucose running high. Continue sliding scale and add meal coverage. .  5. Gout - Finished short course prednisone started 6/9 x 3 doses.    Consult PT.  Needs HH once discharged for HF / Diabetes.   Length of Stay: 12  [name], NP-C  04/23/2016, 11:59 AM  Advanced Heart Failure Team Pager [phone] (M-F; 7a - 4p)  Please contact [facility] for night-coverage after hours (4p -7a ) and weekends on [email or web address]  Patient seen and examined with Tonye Becket, NP. We discussed all aspects of the encounter. I agree with the assessment and plan as stated above.  Marland Kitchen  Weight getting close to baseline and CVP finally coming down. Continue IV diuresis at least one more day. Continue milrinone. Supp K+. Agree with UNNA boots. Once fully diuresed will switch over to po diuretics and see if we can maintain her volume status off milrinone and IV diuresis. If unable to do so will need to consider Palliative Care involvement.   Kayla Weekes,MD 8:28 PM

## 2016-04-23 NOTE — Progress Notes (Signed)
Results for [name], Dao (MRN [id number]) as of 04/23/2016 11:46  Ref. Range 04/22/2016 11:15 04/22/2016 16:23 04/22/2016 20:43 04/23/2016 07:33 04/23/2016 11:11  Glucose-Capillary Latest Ref Range: 65-99 mg/dL 409200 (H) 811172 (H) 914219 (H) 208 (H) 305 (H)  Noted that blood sugars are still elevated. Recommend adding Novolog 5-6 units TID with meals if postprandial blood sugars continue to be elevated. Continue the Novolog RESISTANT correction scale TID. Will continue to monitor blood sugars while in the hospital. Smith MinceKendra Jessice Madill RN BSN CDE

## 2016-04-23 NOTE — Progress Notes (Signed)
Orthopedic Tech Progress Note Patient Details:  Christine Knight 11-12-1950 [id number]  Ortho Devices Type of Ortho Device: Roland RackUnna boot Ortho Device/Splint Location: Bilateral unna boots  Ortho Device/Splint Interventions: Application   Saul FordyceJennifer C Giovanna Kemmerer 04/23/2016, 4:46 PM

## 2016-04-24 LAB — RENAL FUNCTION PANEL
ALBUMIN: 3.6 g/dL (ref 3.5–5.0)
Anion gap: 10 (ref 5–15)
BUN: 62 mg/dL — AB (ref 6–20)
CALCIUM: 9.5 mg/dL (ref 8.9–10.3)
CHLORIDE: 85 mmol/L — AB (ref 101–111)
CO2: 37 mmol/L — ABNORMAL HIGH (ref 22–32)
CREATININE: 1.49 mg/dL — AB (ref 0.44–1.00)
GFR, EST AFRICAN AMERICAN: 42 mL/min — AB (ref >60)
GFR, EST NON AFRICAN AMERICAN: 36 mL/min — AB (ref >60)
Glucose, Bld: 210 mg/dL — ABNORMAL HIGH (ref 65–99)
Phosphorus: 4 mg/dL (ref 2.5–4.6)
Potassium: 3 mmol/L — ABNORMAL LOW (ref 3.5–5.1)
SODIUM: 132 mmol/L — AB (ref 135–145)

## 2016-04-24 LAB — GLUCOSE, CAPILLARY
Glucose-Capillary: 229 mg/dL — ABNORMAL HIGH (ref 65–99)
Glucose-Capillary: 148 mg/dL — ABNORMAL HIGH (ref 65–99)
Glucose-Capillary: 169 mg/dL — ABNORMAL HIGH (ref 65–99)
Glucose-Capillary: 131 mg/dL — ABNORMAL HIGH (ref 65–99)

## 2016-04-24 LAB — CARBOXYHEMOGLOBIN
Carboxyhemoglobin: 1.9 % — ABNORMAL HIGH (ref 0.5–1.5)
Methemoglobin: 0.6 % (ref 0.0–1.5)
O2 Saturation: 63.8 %
TOTAL HEMOGLOBIN: 14 g/dL (ref 12.0–16.0)

## 2016-04-24 MED ORDER — TORSEMIDE 20 MG PO TABS
80.0000 mg | ORAL_TABLET | Freq: Two times a day (BID) | ORAL | Status: DC
  Administered 2016-04-24 – 2016-04-26 (×5): 80 mg via ORAL
  Filled 2016-04-24 (×5): qty 4

## 2016-04-24 NOTE — Progress Notes (Addendum)
Patient ID: Christine Knight, female   DOB: 1951/11/04, 65 y.o.   MRN: [id number]     Advanced Heart Failure Rounding Note   Subjective:    Admitted with marked volume overload.  Milrinone started empirically on 6/3 by Dr Royann Shivers with ongoing poor UOP despite high dose diuretics.    Renal consulted 04/15/16 and switched from Lasix gtt at 30 to high dose bolus at 160 mg IV q 6 hrs.  Now at lasix 160 mg IV BID.  Renal signed off 04/19/16 with good diuresis and Cr improvement.   Prednisone started 6/9 for gout x 3 days. Insulin adjusted 04/20/16.  Metolazone added back 04/20/16, Lasix taken back up to TID 04/21/16.   Feeling much better this morning. Still with some fatigue. Having mild dyspnea with exertion, though improved. CVP 6-7 this am.   Out 3.2 L and down another 7 lbs overnight on lasix 160 mg TID and metolazone.  Creatinine and BUN stable. K 3.0  Objective:   Weight Range:  Vital Signs:   Temp:  [97.6 F (36.4 C)-98.6 F (37 C)] 98.6 F (37 C) (06/14 0741) Pulse Rate:  [60-88] 60 (06/14 0741) Resp:  [18-20] 19 (06/14 0741) BP: (95-104)/(53-70) 104/61 mmHg (06/14 0741) SpO2:  [90 %-96 %] 90 % (06/14 0741) Weight:  [230 lb 8 oz (104.554 kg)] 230 lb 8 oz (104.554 kg) (06/14 0457) Last BM Date: 04/23/16  Weight change: Filed Weights   04/22/16 0500 04/23/16 0345 04/24/16 0457  Weight: 243 lb (110.224 kg) 237 lb 6.4 oz (107.684 kg) 230 lb 8 oz (104.554 kg)    Intake/Output:   Intake/Output Summary (Last 24 hours) at 04/24/16 0839 Last data filed at 04/24/16 0814  Gross per 24 hour  Intake    240 ml  Output   3601 ml  Net  -3361 ml     Physical Exam: CVP 6-7  General:  NAD. Sitting in the chair.  HEENT: normal Neck: supple. JVP difficult to assess. Carotids 2+ bilat; no bruits. No thryomegaly or nodule noted.  Cor: PMI laterally displaced. RRR. No M/G/R appreciated Lungs: Decreased basilar sounds Abdomen: Obese, soft, NT, ND no HSM. No bruits or masses. +BS    Extremities: no cyanosis, clubbing, rash, edema. R and LLE 1+ edema  Neuro: alert & orientedx3, cranial nerves grossly intact. moves all 4 extremities w/o difficulty. Affect pleasant  Telemetry: SR PVCs 70s   Labs: Basic Metabolic Panel:  Recent Labs Lab 04/20/16 0600 04/21/16 0454 04/22/16 0522 04/23/16 0517 04/24/16 0516  NA 132* 133* 132* 132* 132*  K 3.7 2.9* 3.5 3.0* 3.0*  CL 87* 86* 84* 84* 85*  CO2 35* 37* 37* 37* 37*  GLUCOSE 308* 188* 259* 191* 210*  BUN 47* 57* 64* 64* 62*  CREATININE 1.61* 1.64* 1.56* 1.51* 1.49*  CALCIUM 9.4 9.6 9.9 9.7 9.5  PHOS 4.1 3.4 3.4 3.3 4.0    Liver Function Tests:  Recent Labs Lab 04/20/16 0600 04/21/16 0454 04/22/16 0522 04/23/16 0517 04/24/16 0516  ALBUMIN 3.3* 3.5 3.7 3.6 3.6   No results for input(s): LIPASE, AMYLASE in the last 168 hours. No results for input(s): AMMONIA in the last 168 hours.  CBC:  Recent Labs Lab 04/18/16 0615 04/19/16 0420 04/20/16 0600 04/21/16 0454 04/22/16 0523  WBC 5.4 6.2 4.8 6.6 6.4  NEUTROABS 3.7 4.4 3.8 5.3 4.9  HGB 11.8* 11.9* 11.6* 11.6* 12.2  HCT 36.0 36.9 36.3 36.7 36.7  MCV 92.3 94.4 92.8 92.9 91.1  PLT 222 219 241  271 281    Cardiac Enzymes: No results for input(s): CKTOTAL, CKMB, CKMBINDEX, TROPONINI in the last 168 hours.  BNP: BNP (last 3 results)  Recent Labs  12/08/15 0922 01/04/16 1139 04/11/16 1929  BNP 1155.5* 513.3* 1176.5*    ProBNP (last 3 results) No results for input(s): PROBNP in the last 8760 hours.    Other results:  Imaging: No results found.   Medications:     Scheduled Medications: . acetaZOLAMIDE  250 mg Oral BID  . aspirin EC  81 mg Oral Daily  . atorvastatin  40 mg Oral Daily  . carvedilol  3.125 mg Oral BID WC  . enoxaparin (LOVENOX) injection  40 mg Subcutaneous Q24H  . furosemide  160 mg Intravenous [id number]  . insulin aspart  0-20 Units Subcutaneous TID WC  . insulin aspart  0-5 Units Subcutaneous QHS  . insulin  aspart  5 Units Subcutaneous TID WC  . metolazone  5 mg Oral BID  . multivitamin with minerals   Oral q morning - 10a  . potassium chloride  40 mEq Oral BID  . sodium chloride flush  10-40 mL Intracatheter Q12H  . sodium chloride flush  3 mL Intravenous Q12H  . spironolactone  25 mg Oral Daily    Infusions: . milrinone 0.125 mcg/kg/min (04/22/16 1732)    PRN Medications: sodium chloride, acetaminophen, dextromethorphan-guaiFENesin, ondansetron (ZOFRAN) IV, sodium chloride flush, sodium chloride flush, technetium TC 8M diethylenetriame-pentaacetic acid   Assessment/Plan/Discussion   Christine Knight is a 65 yo woman with PMH of ischemic cardiomyopathy and chronic systolic heart failure with last EF ~ 15-20%, CABG 08/2011, morbid obesity, pulmonary hypertension (PA pressure 72 mmHg), poorly controlled T2DM.   1. Acute on chronic systolic heart failure: Ischemic CMP, EF 10-15%.  She was admitted with marked volume overload and remains markedly volume overloaded.  Got PICC on 6/3 and milrinone was started.   - Volume status much improved. CVP 6-7. Stop lasix. Transition to torsemide 80 mg BID.  No metolazone today.  - Coox 63.8% on milrinone 0.1. Will stop and recheck coox this afternoon.  - Continue to hold losartan for now with rise in creatinine and soft BP.  - Continue low dose Coreg.  - Continue spiro 25 mg daily. Follow creatinine closely.  - Continue unna boots.  - Long-term will be difficult to manage and may end up requiring HD for management though not sure she will tolerate with low EF.  2. CAD: Stable disease on recent angiogram 2/17. No evidence for ACS.  3. CKD: Stage III-IV.  - Creatinine stable 1.49 BUN 62.    4. DM2:  -Glucose running high. Continue sliding scale and add meal coverage. .  5. Gout - Finished short course prednisone started 6/9 x 3 doses.   Consult PT. Needs HH once discharged for HF / Diabetes. Discussed with patient that if she has recurrent fluid  overload with poor response, will have to involve palliative for goals of care, as she is very unlikely to tolerate HD with her low EF.   Length of Stay: [address]  Christine Knight  04/24/2016, 8:39 AM  Advanced Heart Failure Team Pager [phone] (M-F; 7a - 4p)  Please contact [facility] for night-coverage after hours (4p -7a ) and weekends on [email or web address]   Patient seen and examined with Otilio Saber, PA-C. We discussed all aspects of the encounter. I agree with the assessment and plan as stated above.   Finally euvolemic. Co-ox ok. Agree with stopping milrinone  and switching to po diuretics. Supp K+. We will see if we can maintain her volume status off milrinone and IV diuresis. If unable to do so will need to consider Palliative Care involvement.   Christine Ahart,MD 6:59 PM

## 2016-04-24 NOTE — Evaluation (Signed)
Physical Therapy Evaluation Patient Details Name: Christine Knight MRN: [id number] DOB: 01-Feb-1951 Today's Date: 04/24/2016   History of Present Illness  Christine Knight is a 65 yo woman admitted 04/11/16 with PMH of ischemic cardiomyopathy and chronic systolic heart failure with last known EF ~ 15-20%, CABG 08/2011, morbid obesity, pulmonary hypertension (PA pressure 72 mmHg), poorly controlled T2DM who presents with weight gain and dyspnea and was last seen in HF clinic 01/04/16 and 01/18/16 after a HF hospitalization 1/27 - 12/28/15 with 40 lb weight gain treated with lasix gtt at 30 mg/hr and metolazone 5 mg bid with some diamox with 70 lb diuresis and discharge weight of 237 lbs and RHC/LHC revealing preserved CO and mild/moderate pulmonary hypertension with severe native CAD and patent grafts.  Clinical Impression  Pt admitted with above diagnosis. Pt currently with functional limitations due to the deficits listed below (see PT Problem List).  Pt will benefit from skilled PT to increase their independence and safety with mobility to allow discharge to the venue listed below.  Pt moving fairly well considering she was admitted 04/11/16 and has had limited ambulation.  Pt with generalized weakness but able to ambulate without AD.  Do not feel she will need any PT after dc, but could benefit from outpatient cardiac rehab program.     Follow Up Recommendations Other (comment);No PT follow up (cardiac rehab?)    Equipment Recommendations  None recommended by PT    Recommendations for Other Services       Precautions / Restrictions Precautions Precautions: None      Mobility  Bed Mobility               General bed mobility comments: sitting up in recliner upon arrival  Transfers Overall transfer level: Independent               General transfer comment: [facility]  Ambulation/Gait Ambulation/Gait assistance: Min guard;Supervision Ambulation  Distance (Feet): 145 Feet Assistive device: None Gait Pattern/deviations: Step-through pattern;Drifts right/left Gait velocity: decreased   General Gait Details: Pt ambulated with step through pattern with S increasing to min/guard as she fatigued and began drifting a bit.  No LOB, but pt continued to comment on how weak she felt. HR 90bpm after gait.  Stairs            Wheelchair Mobility    Modified Rankin (Stroke Patients Only)       Balance Overall balance assessment: No apparent balance deficits (not formally assessed)                                           Pertinent Vitals/Pain Pain Assessment: No/denies pain    Home Living Family/patient expects to be discharged to:: Private residence Living Arrangements: Children;Other relatives   Type of Home: Apartment Home Access: Stairs to enter Entrance Stairs-Rails: Right Entrance Stairs-Number of Steps: 2 Home Layout: One level Home Equipment: Cane - single point      Prior Function Level of Independence: Independent         Comments: retired about a year ago     Hand Dominance        Extremity/Trunk Assessment   Upper Extremity Assessment: Overall WFL for tasks assessed           Lower Extremity Assessment: Overall WFL for tasks assessed;Generalized weakness  Cervical / Trunk Assessment: Normal  Communication      Cognition Arousal/Alertness: Awake/alert Behavior During Therapy: WFL for tasks assessed/performed Overall Cognitive Status: Within Functional Limits for tasks assessed                      General Comments General comments (skin integrity, edema, etc.): Pt kept commenting on how weak her legs felt, but was motivated to work with PT    Exercises General Exercises - Lower Extremity Ankle Circles/Pumps: AROM;10 reps;Seated Long Arc Quad: AROM;Both;10 reps;Seated      Assessment/Plan    PT Assessment Patient needs continued PT services  PT  Diagnosis Generalized weakness   PT Problem List Decreased strength;Decreased activity tolerance;Decreased balance;Decreased mobility  PT Treatment Interventions DME instruction;Gait training;Stair training;Functional mobility training;Therapeutic activities;Therapeutic exercise;Balance training   PT Goals (Current goals can be found in the Care Plan section) Acute Rehab PT Goals Patient Stated Goal: to go home PT Goal Formulation: With patient Time For Goal Achievement: 05/01/16 Potential to Achieve Goals: Good    Frequency Min 3X/week   Barriers to discharge        Co-evaluation               End of Session   Activity Tolerance: Patient tolerated treatment well;Patient limited by fatigue Patient left: in chair;with call bell/phone within reach Nurse Communication: Mobility status         Time: 0865-7846 PT Time Calculation (min) (ACUTE ONLY): 21 min   Charges:   PT Evaluation $PT Eval Low Complexity: 1 Procedure     PT G Codes:        Christine Knight 04/24/2016, 10:44 AM

## 2016-04-24 NOTE — Progress Notes (Signed)
Pt B/Ps have been on lower side today. Coreg has been held based on parameters. HF team aware. Cecille Rubinhompson,Marrianne Sica V, RN

## 2016-04-24 NOTE — Progress Notes (Signed)
Patient refusing to wear UNNA boots any longer. Patient understands the purpose of the UNNA boots but patient states that she is having severe itching on her legs that she can no longer bear. UNNA boots removed per patient request.

## 2016-04-25 LAB — CARBOXYHEMOGLOBIN
Carboxyhemoglobin: 1.9 % — ABNORMAL HIGH (ref 0.5–1.5)
Methemoglobin: 0.7 % (ref 0.0–1.5)
O2 SAT: 60.8 %
Total hemoglobin: 14.3 g/dL (ref 12.0–16.0)

## 2016-04-25 LAB — RENAL FUNCTION PANEL
Albumin: 3.7 g/dL (ref 3.5–5.0)
Anion gap: 16 — ABNORMAL HIGH (ref 5–15)
BUN: 65 mg/dL — ABNORMAL HIGH (ref 6–20)
CHLORIDE: 82 mmol/L — AB (ref 101–111)
CO2: 36 mmol/L — AB (ref 22–32)
CREATININE: 1.7 mg/dL — AB (ref 0.44–1.00)
Calcium: 9.9 mg/dL (ref 8.9–10.3)
GFR calc non Af Amer: 31 mL/min — ABNORMAL LOW (ref >60)
GFR, EST AFRICAN AMERICAN: 36 mL/min — AB (ref >60)
GLUCOSE: 122 mg/dL — AB (ref 65–99)
Phosphorus: 4.6 mg/dL (ref 2.5–4.6)
Potassium: 2.9 mmol/L — ABNORMAL LOW (ref 3.5–5.1)
Sodium: 134 mmol/L — ABNORMAL LOW (ref 135–145)

## 2016-04-25 LAB — GLUCOSE, CAPILLARY
GLUCOSE-CAPILLARY: 137 mg/dL — AB (ref 65–99)
GLUCOSE-CAPILLARY: 160 mg/dL — AB (ref 65–99)
Glucose-Capillary: 136 mg/dL — ABNORMAL HIGH (ref 65–99)
Glucose-Capillary: 107 mg/dL — ABNORMAL HIGH (ref 65–99)

## 2016-04-25 LAB — CBC
HCT: 40.8 % (ref 36.0–46.0)
HEMOGLOBIN: 13.4 g/dL (ref 12.0–15.0)
MCH: 29.9 pg (ref 26.0–34.0)
MCHC: 32.8 g/dL (ref 30.0–36.0)
MCV: 91.1 fL (ref 78.0–100.0)
PLATELETS: 347 10*3/uL (ref 150–400)
RBC: 4.48 MIL/uL (ref 3.87–5.11)
RDW: 15.5 % (ref 11.5–15.5)
WBC: 5.8 10*3/uL (ref 4.0–10.5)

## 2016-04-25 MED ORDER — POTASSIUM CHLORIDE CRYS ER 20 MEQ PO TBCR
40.0000 meq | EXTENDED_RELEASE_TABLET | Freq: Once | ORAL | Status: AC
Start: 2016-04-25 — End: 2016-04-25
  Administered 2016-04-25: 40 meq via ORAL
  Filled 2016-04-25: qty 2

## 2016-04-25 NOTE — Progress Notes (Addendum)
Patient ID: Christine Knight, female   DOB: [birth year], 65 y.o.   MRN: [id number]     Advanced Heart Failure Rounding Note   Subjective:    Admitted with marked volume overload.  Milrinone started empirically on 6/3 by Dr Royann Shiversroitoru with ongoing poor UOP despite high dose diuretics.    Renal consulted 04/15/16 and switched from Lasix gtt at 30 to high dose bolus at 160 mg IV q 6 hrs.  Now at lasix 160 mg IV BID.  Renal signed off 04/19/16 with good diuresis and Cr improvement.   Prednisone started 6/9 for gout x 3 days. Insulin adjusted 04/20/16.  Metolazone added back 04/20/16, Lasix taken back up to TID 04/21/16.   Coox 60% this morning off of milrinone.  Creatinine 1.5 -> 1.7 and BUN stable. CVP 3-4  Feels great.  Fluid is the lowest it has been in a very long time.  She is out 49 lbs from admission. Denies SOB, CP, lightheadedness, or dizziness.  Out 2.5 L and down another 5 lbs with transition to po torsemide 80 mg BID yesterday, though she also received 5 mg metolazone in am.  K 2.9.   Objective:   Weight Range:  Vital Signs:   Temp:  [97.6 F (36.4 C)-99 F (37.2 C)] 98.4 F (36.9 C) (06/15 0533) Pulse Rate:  [53-71] 71 (06/15 0533) Resp:  [16-19] 16 (06/15 0533) BP: (90-104)/(49-73) 95/65 mmHg (06/15 0533) SpO2:  [90 %-99 %] 93 % (06/15 0533) Weight:  [225 lb (102.059 kg)] 225 lb (102.059 kg) (06/15 0533) Last BM Date: 04/23/16  Weight change: Filed Weights   04/23/16 0345 04/24/16 0457 04/25/16 0533  Weight: 237 lb 6.4 oz (107.684 kg) 230 lb 8 oz (104.554 kg) 225 lb (102.059 kg)    Intake/Output:   Intake/Output Summary (Last 24 hours) at 04/25/16 0649 Last data filed at 04/25/16 0600  Gross per 24 hour  Intake    840 ml  Output   3200 ml  Net  -2360 ml     Physical Exam: CVP 3-4 General:  NAD. Sitting in the chair.  HEENT: normal Neck: supple. JVP difficult to assess. Carotids 2+ bilat; no bruits. No thryomegaly or nodule noted.  Cor: PMI laterally  displaced. RRR. No M/G/R appreciated Lungs: CTAB, normal effort Abdomen: Obese, soft, non-tender, non-distended, no HSM. No bruits or masses. +BS  Extremities: no cyanosis, clubbing, rash. Trace ankle edema  Neuro: alert & orientedx3, cranial nerves grossly intact. moves all 4 extremities w/o difficulty. Affect pleasant  Telemetry: SR PVCs 70s   Labs: Basic Metabolic Panel:  Recent Labs Lab 04/21/16 0454 04/22/16 0522 04/23/16 0517 04/24/16 0516 04/25/16 0534  NA 133* 132* 132* 132* 134*  K 2.9* 3.5 3.0* 3.0* 2.9*  CL 86* 84* 84* 85* 82*  CO2 37* 37* 37* 37* 36*  GLUCOSE 188* 259* 191* 210* 122*  BUN 57* 64* 64* 62* 65*  CREATININE 1.64* 1.56* 1.51* 1.49* 1.70*  CALCIUM 9.6 9.9 9.7 9.5 9.9  PHOS 3.4 3.4 3.3 4.0 4.6    Liver Function Tests:  Recent Labs Lab 04/21/16 0454 04/22/16 0522 04/23/16 0517 04/24/16 0516 04/25/16 0534  ALBUMIN 3.5 3.7 3.6 3.6 3.7   No results for input(s): LIPASE, AMYLASE in the last 168 hours. No results for input(s): AMMONIA in the last 168 hours.  CBC:  Recent Labs Lab 04/19/16 0420 04/20/16 0600 04/21/16 0454 04/22/16 0523 04/25/16 0534  WBC 6.2 4.8 6.6 6.4 5.8  NEUTROABS 4.4 3.8 5.3 4.9  --  HGB 11.9* 11.6* 11.6* 12.2 13.4  HCT 36.9 36.3 36.7 36.7 40.8  MCV 94.4 92.8 92.9 91.1 91.1  PLT 219 241 271 281 347    Cardiac Enzymes: No results for input(s): CKTOTAL, CKMB, CKMBINDEX, TROPONINI in the last 168 hours.  BNP: BNP (last 3 results)  Recent Labs  12/08/15 0922 01/04/16 1139 04/11/16 1929  BNP 1155.5* 513.3* 1176.5*    ProBNP (last 3 results) No results for input(s): PROBNP in the last 8760 hours.    Other results:  Imaging: No results found.   Medications:     Scheduled Medications: . acetaZOLAMIDE  250 mg Oral BID  . aspirin EC  81 mg Oral Daily  . atorvastatin  40 mg Oral Daily  . carvedilol  3.125 mg Oral BID WC  . enoxaparin (LOVENOX) injection  40 mg Subcutaneous Q24H  . insulin aspart   0-20 Units Subcutaneous TID WC  . insulin aspart  0-5 Units Subcutaneous QHS  . insulin aspart  5 Units Subcutaneous TID WC  . multivitamin with minerals   Oral q morning - 10a  . potassium chloride  40 mEq Oral BID  . sodium chloride flush  10-40 mL Intracatheter Q12H  . sodium chloride flush  3 mL Intravenous Q12H  . spironolactone  25 mg Oral Daily  . torsemide  80 mg Oral BID    Infusions:    PRN Medications: sodium chloride, acetaminophen, dextromethorphan-guaiFENesin, ondansetron (ZOFRAN) IV, sodium chloride flush, sodium chloride flush, technetium TC 71M diethylenetriame-pentaacetic acid   Assessment/Plan/Discussion   Christine Knight is a 65 yo woman with PMH of ischemic cardiomyopathy and chronic systolic heart failure with last EF ~ 15-20%, CABG 08/2011, morbid obesity, pulmonary hypertension (PA pressure 72 mmHg), poorly controlled T2DM.   1. Acute on chronic systolic heart failure: Ischemic CMP, EF 10-15%.  She was admitted with marked volume overload and remains markedly volume overloaded.  Got PICC on 6/3 and milrinone was started.   - Volume status much improved. CVP 3-4 with metolazone yesterday.  Continue torsemide 80 mg BID. May be able to use prn metolazone to keep her weight from going back up.  Supp K+. Should improve now off IV lasix and metolazone.  - Coox 60% off milrinone. Will not need for home.   - Continue to hold losartan for now with rise in creatinine and soft BP.  - Continue low dose Coreg.  - Continue spiro 25 mg daily. Follow creatinine closely.  - Continue unna boots.  - Long-term will be difficult to manage and may end up requiring HD for management though not sure she will tolerate with low EF.  2. CAD: Stable disease on recent angiogram 2/17. No evidence for ACS.  3. CKD: Stage III-IV.  - Creatinine up slightly to 1.7 with metolazone yesterday.  BUN 64 4. DM2:  -Glucose running high. Continue sliding scale and meal coverage. .  5. Gout - Finished  short course prednisone started 6/9 x 3 doses.   PT recommends cardiac rehab.  They have now been consulted.  With tenuous fluid status will observe one more day on po diuretics.   Needs HH once discharged for HF / Diabetes. Discussed with patient that if she has recurrent fluid overload with poor response, will have to involve palliative for goals of care, as she is very unlikely to tolerate HD with her low EF.   Length of Stay: [address]  04/25/2016, 6:49 AM  Advanced Heart Failure Team Pager [phone] (  M-F; 7a - 4p)  Please contact [facility] for night-coverage after hours (4p -7a ) and weekends on [email or web address]  Patient seen and examined with Otilio Saber, PA-C. We discussed all aspects of the encounter. I agree with the assessment and plan as stated above.   Volume status and co-ox stable off milrinone and IV lasix. Continue torsemide today. Watch for 24 more hours. If remains stable can d/c in am. Reinforced need for dietary restriction, daily weights and reviewed use of sliding scale diuretics.  Darrel Baroni,MD 8:54 AM

## 2016-04-25 NOTE — Progress Notes (Signed)
PT Cancellation Note  Patient Details Name: Christine Knight MRN: [id number] DOB: 06-Jul-1951   Cancelled Treatment:    Reason Eval/Treat Not Completed: Patient at procedure or test/unavailable. Pt off the floor ambulating with nurse and going outside for fresh air.  Will check back tomorrow.   Christine Knight 04/25/2016, 12:16 PM

## 2016-04-25 NOTE — Progress Notes (Signed)
Pt and RN walked outside today. Pt pushed the wheelchair, we sat outside for a while. Pt was comfortable, and denied pain and SOB. Pt CVP 10 this afternoon, RN will recheck. Cecille Rubinhompson,Meygan Kyser V, RN

## 2016-04-25 NOTE — Progress Notes (Signed)
Pt apparently walked outside earlier and now is up pacing slowly in the room. Steady. Reviewed daily wts, low sodium (she did not know 2000 mg restriction), walking at home and CRPII. Pt had tried to do CRPII in March however she apparently did not have insurance coverage. She is interested in trying again and should be receiving Medicare in the near future (turning 65). Will send referral and try for CRPII again. Set up HF video. [phone] Ethelda ChickKristan Kristoph Sattler CES, ACSM 2:35 PM 04/25/2016

## 2016-04-26 LAB — CARBOXYHEMOGLOBIN
CARBOXYHEMOGLOBIN: 1.4 % (ref 0.5–1.5)
METHEMOGLOBIN: 0.7 % (ref 0.0–1.5)
O2 Saturation: 68.8 %
Total hemoglobin: 14.5 g/dL (ref 12.0–16.0)

## 2016-04-26 LAB — RENAL FUNCTION PANEL
ANION GAP: 10 (ref 5–15)
Albumin: 3.8 g/dL (ref 3.5–5.0)
BUN: 67 mg/dL — ABNORMAL HIGH (ref 6–20)
CHLORIDE: 85 mmol/L — AB (ref 101–111)
CO2: 38 mmol/L — AB (ref 22–32)
Calcium: 9.7 mg/dL (ref 8.9–10.3)
Creatinine, Ser: 1.78 mg/dL — ABNORMAL HIGH (ref 0.44–1.00)
GFR calc Af Amer: 34 mL/min — ABNORMAL LOW (ref >60)
GFR calc non Af Amer: 29 mL/min — ABNORMAL LOW (ref >60)
GLUCOSE: 155 mg/dL — AB (ref 65–99)
POTASSIUM: 3.1 mmol/L — AB (ref 3.5–5.1)
Phosphorus: 4.3 mg/dL (ref 2.5–4.6)
Sodium: 133 mmol/L — ABNORMAL LOW (ref 135–145)

## 2016-04-26 LAB — MAGNESIUM: Magnesium: 2.4 mg/dL (ref 1.7–2.4)

## 2016-04-26 LAB — GLUCOSE, CAPILLARY
Glucose-Capillary: 185 mg/dL — ABNORMAL HIGH (ref 65–99)
Glucose-Capillary: 178 mg/dL — ABNORMAL HIGH (ref 65–99)

## 2016-04-26 MED ORDER — SPIRONOLACTONE 25 MG PO TABS: 25.0000 mg | ORAL_TABLET | Freq: Every day | ORAL | Status: DC

## 2016-04-26 MED ORDER — TORSEMIDE 20 MG PO TABS: 80.0000 mg | ORAL_TABLET | Freq: Two times a day (BID) | ORAL | Status: DC

## 2016-04-26 MED ORDER — POTASSIUM CHLORIDE CRYS ER 20 MEQ PO TBCR: 40.0000 meq | EXTENDED_RELEASE_TABLET | Freq: Two times a day (BID) | ORAL | Status: DC

## 2016-04-26 MED ORDER — METOLAZONE 2.5 MG PO TABS: ORAL_TABLET | ORAL | Status: DC

## 2016-04-26 MED FILL — metOLazone 2.5 MG TABS: 2.5 | 30 days supply | Qty: 15 | Fill #0

## 2016-04-26 MED FILL — KLOR-CON M20 TABLET: 20 | 30 days supply | Qty: 140 | Fill #0

## 2016-04-26 MED FILL — SPIRONOLACTONE 25 MG TABLET: 25 | 30 days supply | Qty: 30 | Fill #0

## 2016-04-26 MED FILL — TORSEMIDE 20 MG TABLET: 20 | 30 days supply | Qty: 240 | Fill #0

## 2016-04-26 NOTE — Progress Notes (Signed)
Physical Therapy Treatment Patient Details Name: Lum BabeBarbara Carboni MRN: [id number] DOB: 05/12/1951 Today's Date: 04/26/2016    History of Present Illness Ms. Francisco is a 65 yo woman admitted 04/11/16 with PMH of ischemic cardiomyopathy and chronic systolic heart failure with last known EF ~ 15-20%, CABG 08/2011, morbid obesity, pulmonary hypertension (PA pressure 72 mmHg), poorly controlled T2DM who presents with weight gain and dyspnea and was last seen in HF clinic 01/04/16 and 01/18/16 after a HF hospitalization 1/27 - 12/28/15 with 40 lb weight gain treated with lasix gtt at 30 mg/hr and metolazone 5 mg bid with some diamox with 70 lb diuresis and discharge weight of 237 lbs and RHC/LHC revealing preserved CO and mild/moderate pulmonary hypertension with severe native CAD and patent grafts.    PT Comments    Patient seen for mobility progression, ambulated in hall with 3 standing rest breaks and performed stair negotiation. Anticipate patient will be safe for d/c home. Recommend physical assist for stair negotiation.   Follow Up Recommendations  Other (comment);No PT follow up (cardiac rehab?)     Equipment Recommendations  None recommended by PT    Recommendations for Other Services       Precautions / Restrictions Precautions Precautions: None Restrictions Weight Bearing Restrictions: No    Mobility  Bed Mobility               General bed mobility comments: sitting EOB  Transfers Overall transfer level: Independent               General transfer comment: [facility]  Ambulation/Gait Ambulation/Gait assistance: Modified independent (Device/Increase time) Ambulation Distance (Feet): 240 Feet Assistive device: None Gait Pattern/deviations: Step-through pattern;Drifts right/left Gait velocity: decreased   General Gait Details: Pt ambulated with step through pattern with S increasing to min/guard as she fatigued and began drifting a  bit.  No LOB, but pt continued to comment on how weak she felt. HR 90bpm after gait.   Stairs Stairs: Yes Stairs assistance: Min assist Stair Management: Forwards Number of Stairs: 3 General stair comments: Min assist for elevation onto each step  Wheelchair Mobility    Modified Rankin (Stroke Patients Only)       Balance                                    Cognition Arousal/Alertness: Awake/alert Behavior During Therapy: WFL for tasks assessed/performed Overall Cognitive Status: Within Functional Limits for tasks assessed                      Exercises      General Comments        Pertinent Vitals/Pain Pain Assessment: No/denies pain    Home Living                      Prior Function            PT Goals (current goals can now be found in the care plan section) Acute Rehab PT Goals Patient Stated Goal: to go home PT Goal Formulation: With patient Time For Goal Achievement: 05/01/16 Potential to Achieve Goals: Good Progress towards PT goals: Progressing toward goals    Frequency  Min 3X/week    PT Plan Current plan remains appropriate    Co-evaluation             End  of Session Equipment Utilized During Treatment: Gait belt Activity Tolerance: Patient tolerated treatment well Patient left: in bed;with call bell/phone within reach (sitting EOB)     Time: 7829-5621 PT Time Calculation (min) (ACUTE ONLY): 20 min  Charges:  $Gait Training: 8-22 mins                    G CodesFabio Asa 05-20-2016, 1:16 PM Charlotte Crumb, PT DPT  [phone]

## 2016-04-26 NOTE — Progress Notes (Signed)
MEDICATION RELATED CONSULT NOTE - INITIAL   Patient seen with HF team and counseled on discharge medications. Stressed importance of taking her diuretics as prescribed to keep her out of the hospital. Will follow her closely as outpatient and she was instructed to bring all meds to her next clinic appointment.   Somewhat limited understanding but also made notations on bottles to help.  Sheppard CoilFrank Wilson PharmD., BCPS Clinical Pharmacist Pager [phone] 04/26/2016 12:04 PM

## 2016-04-26 NOTE — Progress Notes (Signed)
Patient ID: Christine Knight, female   DOB: [birth year], 65 y.o.   MRN: [id number]     Advanced Heart Failure Rounding Note   Subjective:    Admitted with marked volume overload.  Milrinone started empirically on 6/3 by Dr Christine Knight with ongoing poor UOP despite high dose diuretics.    Renal consulted 04/15/16 and switched from Lasix gtt at 30 to high dose bolus at 160 mg IV q 6 hrs.  Now at lasix 160 mg IV BID.  Renal signed off 04/19/16 with good diuresis and Cr improvement.   Prednisone started 6/9 for gout x 3 days. Insulin adjusted 04/20/16.  Metolazone added back 04/20/16, Lasix taken back up to TID 04/21/16.   Coox 68.8% this morning off of milrinone.  Creatinine stable at 1.7 and BUN 67. CVP 3-4  Continues to feel great.  Weight down 52 lbs from admit.  Denies SOB, lightheadedness, or dizziness. No CP.   Out 2.9 L and down 3 lbs on po torsemide 80 mg BID yesterday. K 3.1   Objective:   Weight Range:  Vital Signs:   Temp:  [98.1 F (36.7 C)-98.9 F (37.2 C)] 98.9 F (37.2 C) (06/16 0745) Pulse Rate:  [52-75] 52 (06/16 0745) Resp:  [16-18] 16 (06/16 0745) BP: (93-108)/(47-72) 101/59 mmHg (06/16 0745) SpO2:  [88 %-99 %] 96 % (06/16 0745) Weight:  [222 lb (100.699 kg)] 222 lb (100.699 kg) (06/16 0528) Last BM Date: 04/23/16  Weight change: Filed Weights   04/24/16 0457 04/25/16 0533 04/26/16 0528  Weight: 230 lb 8 oz (104.554 kg) 225 lb (102.059 kg) 222 lb (100.699 kg)    Intake/Output:   Intake/Output Summary (Last 24 hours) at 04/26/16 0846 Last data filed at 04/26/16 0837  Gross per 24 hour  Intake   1220 ml  Output   3775 ml  Net  -2555 ml     Physical Exam: CVP 3-4 General:  NAD. Sitting in the chair.  HEENT: normal Neck: supple. JVP difficult to assess. Carotids 2+ bilat; no bruits. No thryomegaly or nodule noted.  Cor: PMI laterally displaced. RRR. No M/G/R appreciated Lungs: Clear, normal effort Abdomen: Obese, soft, NT, ND, no HSM. No bruits or masses.  +BS  Extremities: no cyanosis, clubbing, rash. Trace ankle edema  Neuro: alert & orientedx3, cranial nerves grossly intact. moves all 4 extremities w/o difficulty. Affect pleasant  Telemetry: Reviewed, NSR 70s with PVCs  Labs: Basic Metabolic Panel:  Recent Labs Lab 04/22/16 0522 04/23/16 0517 04/24/16 0516 04/25/16 0534 04/26/16 0540  NA 132* 132* 132* 134* 133*  K 3.5 3.0* 3.0* 2.9* 3.1*  CL 84* 84* 85* 82* 85*  CO2 37* 37* 37* 36* 38*  GLUCOSE 259* 191* 210* 122* 155*  BUN 64* 64* 62* 65* 67*  CREATININE 1.56* 1.51* 1.49* 1.70* 1.78*  CALCIUM 9.9 9.7 9.5 9.9 9.7  PHOS 3.4 3.3 4.0 4.6 4.3    Liver Function Tests:  Recent Labs Lab 04/22/16 0522 04/23/16 0517 04/24/16 0516 04/25/16 0534 04/26/16 0540  ALBUMIN 3.7 3.6 3.6 3.7 3.8   No results for input(s): LIPASE, AMYLASE in the last 168 hours. No results for input(s): AMMONIA in the last 168 hours.  CBC:  Recent Labs Lab 04/20/16 0600 04/21/16 0454 04/22/16 0523 04/25/16 0534  WBC 4.8 6.6 6.4 5.8  NEUTROABS 3.8 5.3 4.9  --   HGB 11.6* 11.6* 12.2 13.4  HCT 36.3 36.7 36.7 40.8  MCV 92.8 92.9 91.1 91.1  PLT 241 271 281 347    Cardiac  Enzymes: No results for input(s): CKTOTAL, CKMB, CKMBINDEX, TROPONINI in the last 168 hours.  BNP: BNP (last 3 results)  Recent Labs  12/08/15 0922 01/04/16 1139 04/11/16 1929  BNP 1155.5* 513.3* 1176.5*    ProBNP (last 3 results) No results for input(s): PROBNP in the last 8760 hours.    Other results:  Imaging: No results found.   Medications:     Scheduled Medications: . aspirin EC  81 mg Oral Daily  . atorvastatin  40 mg Oral Daily  . enoxaparin (LOVENOX) injection  40 mg Subcutaneous Q24H  . insulin aspart  0-20 Units Subcutaneous TID WC  . insulin aspart  0-5 Units Subcutaneous QHS  . insulin aspart  5 Units Subcutaneous TID WC  . multivitamin with minerals   Oral q morning - 10a  . potassium chloride  40 mEq Oral BID  . sodium chloride  flush  10-40 mL Intracatheter Q12H  . sodium chloride flush  3 mL Intravenous Q12H  . spironolactone  25 mg Oral Daily  . torsemide  80 mg Oral BID    Infusions:    PRN Medications: sodium chloride, acetaminophen, dextromethorphan-guaiFENesin, ondansetron (ZOFRAN) IV, sodium chloride flush, sodium chloride flush, technetium TC 89M diethylenetriame-pentaacetic acid   Assessment/Plan/Discussion   Christine Knight is a 65 yo woman with PMH of ischemic cardiomyopathy and chronic systolic heart failure with last EF ~ 15-20%, CABG 08/2011, morbid obesity, pulmonary hypertension (PA pressure 72 mmHg), poorly controlled T2DM.   1. Acute on chronic systolic heart failure: Ischemic CMP, EF 10-15%.  She was admitted with marked volume overload and remains markedly volume overloaded.  Got PICC on 6/3 and milrinone was started.   - Volume status much improved. CVP 3-4 with metolazone yesterday.  Continue torsemide 80 mg BID. May be able to use prn metolazone to keep her weight from going back up.  Supp K+. Should improve now off IV lasix and metolazone.  - Coox 60% off milrinone. Will not need for home.   - Continue to hold losartan for now with rise in creatinine and soft BP.  - Continue low dose Coreg.  - Continue spiro 25 mg daily. Follow creatinine closely.  - Continue unna boots.  - Long-term will be difficult to manage and may end up requiring HD for management though not sure she will tolerate with low EF.  2. CAD: Stable disease on recent angiogram 2/17. No evidence for ACS.  3. CKD: Stage III-IV.  - Creatinine up slightly to 1.7 with metolazone yesterday.  BUN 64 4. DM2:  -Glucose running high. Continue sliding scale and meal coverage. .  5. Gout - Finished short course prednisone started 6/9 x 3 doses.   Referral for outpatient CR made.   A little dry on po diuretics.  Will go home today.  Will make sure electrolytes good before sending out, Mag pending. Will discuss optimal diuretics  with MD.   Needs HH once discharged for HF / Diabetes. Discussed with patient that if she has recurrent fluid overload with poor response, will have to involve palliative for goals of care, as she is very unlikely to tolerate HD with her low EF.   Length of Stay: [address]  Christine Knight, [place]  04/26/2016, 8:46 AM  Advanced Heart Failure Team Pager [phone] (M-F; 7a - 4p)  Please contact [facility] for night-coverage after hours (4p -7a ) and weekends on [email or web address]   Patient seen and examined with Otilio Saber, PA-C. We discussed all aspects of the  encounter. I agree with the assessment and plan as stated above.   She is finally euvolemic. Weight down 53 pounds. Can go home today on torsemide and prn metolazone for weight gain. Will have pharmacy educate her about meds.   Wayne Brunker,MD 8:53 AM

## 2016-04-26 NOTE — Progress Notes (Signed)
At [phone] patient had a short run of wide complex tachycardia. Patient asymptomatic; strip saved in EPIC; will continue to monitor.

## 2016-04-26 NOTE — Discharge Instructions (Signed)

## 2016-04-26 NOTE — Progress Notes (Signed)
At [phone] patient had 6 beats of Vtach. Patient sleeping and asymptomatic; will continue to monitor.

## 2016-04-26 NOTE — Discharge Summary (Signed)
Advanced Heart Failure Discharge Note   Discharge Summary   Patient ID: Christine Knight MRN: [id number], DOB/AGE: 04/28/1951 65 y.o. Admit date: 04/11/2016 D/C date:     04/26/2016   Primary Discharge Diagnoses:  1. Acute on chronic systolic HF with significant volume overload 2. CAD 3. CKD stage III-IV 4. DM2 5. Gout flar   Consults: Nephrology 04/15/16  Hospital Course:   [name] is a 65 yo woman with PMH of ischemic cardiomyopathy and chronic systolic heart failure with last known EF ~ 15-20%, CABG 08/2011, morbid obesity, pulmonary hypertension (PA pressure 72 mmHg), poorly controlled T2DM who presents with weight gain and dyspnea and was last seen in HF clinic 01/04/16 and 01/18/16 after a HF hospitalization 1/27 - 12/28/15 with 40 lb weight gain treated with lasix gtt at 30 mg/hr and metolazone 5 mg bid with some diamox with 70 lb diuresis and discharge weight of 237 lbs and RHC/LHC revealing preserved CO and mild/moderate pulmonary hypertension with severe native CAD and patent grafts.  Admitted 04/11/16 with a weight of 274 lbs and weight gain over "one month". Pt also complained of worsening dyspnea with PND and early satiety. Pt initially had poor response to IV lasix and despite IV bolus of 100 mg of lasix and lasix gtt up to 30 mg/hr with diamox 250 mg BID and metolazone. Milrinone started empirically 04/13/16 with poor response. Mixed venous sat remained stable throughout admission, including when weaning off milrinone. CVP was measured via PICC as high as mid/upper 20s. Creatinine began to climb as well, prompting renal consult.   Renal saw 04/15/16 with creatinine peak of 1.79 and suggested high dose bolus lasix of 160 mg TID.   Pt began to slowly diurese with associated improvement in renal function.  Had long discussions with patient about bouts of intractable volume overload worrisome for her overall prognosis with her EF in 15-20% range.  She would be very poor candidate for  dialysis.  Fortunately, pt continued to diuresis.  Renal signed off 04/19/16 with improvement in pt renal function and continued diuresis and recommended trial of po diuretics.  Pt began to re-accumulate volume with decrease of IV lasix to BID and cessation of metolazone, so lasix taken back up to 160 mg TID with metolazone.    Hospital course further complicated by Gout flare, treated with short burst of prednisone 40 mg x 3 days with symptom resolution.  Pt also had hypokalemia requiring high doses of oral potassium to maintain.     Pt transitioned to oral diuretic regimen of 80 mg torsemide BID with prn metolazone and observed for several days with continued diuresis.  Pt again had creatinine up to 1.7 on day of discharge, so instructed to hold evening dose of torsemide.  Overall patient diuresed 30 Liters and down 53 lbs from highest weight this admission on lasix up to 160 mg TID with metolazone.   She will be discharged in stable condition to home with [facility] and close follow up in HF clinic as below.   HF Navigator has also seen for consideration for Paramedicine program.   Discharge Weight Range: 222 lbs Discharge Vitals: Blood pressure 107/67, pulse 70, temperature 98 F (36.7 C), temperature source Oral, resp. rate 17, height '5\' 6"'$  (1.676 m), weight 222 lb (100.699 kg), SpO2 97 %.  Labs: Lab Results  Component Value Date   WBC 5.8 04/25/2016   HGB 13.4 04/25/2016   HCT 40.8 04/25/2016   MCV 91.1 04/25/2016   PLT 347  04/25/2016     Recent Labs Lab 04/26/16 0540  NA 133*  K 3.1*  CL 85*  CO2 38*  BUN 67*  CREATININE 1.78*  CALCIUM 9.7  GLUCOSE 155*   Lab Results  Component Value Date   CHOL 136 12/09/2015   HDL 26* 12/09/2015   LDLCALC 94 12/09/2015   TRIG 82 12/09/2015   BNP (last 3 results)  Recent Labs  12/08/15 0922 01/04/16 1139 04/11/16 1929  BNP 1155.5* 513.3* 1176.5*    ProBNP (last 3 results) No results for input(s): PROBNP in the last 8760  hours.   Diagnostic Studies/Procedures   No results found.  Discharge Medications     Medication List    STOP taking these medications        carvedilol 3.125 MG tablet  Commonly known as:  COREG     losartan 25 MG tablet  Commonly known as:  COZAAR     sitaGLIPtin-metformin 50-1000 MG tablet  Commonly known as:  JANUMET      TAKE these medications        acetaminophen 500 MG tablet  Commonly known as:  TYLENOL  Take 1,000 mg by mouth every 12 (twelve) hours as needed for mild pain or moderate pain.     aspirin EC 81 MG tablet  Take 1 tablet (81 mg total) by mouth daily.     atorvastatin 10 MG tablet  Commonly known as:  LIPITOR  Take 1 tablet (10 mg total) by mouth daily.     CENTRUM SILVER 50+WOMEN PO  Take 1 tablet by mouth every morning.     dextromethorphan-guaiFENesin 30-600 MG 12hr tablet  Commonly known as:  MUCINEX DM  Take 1 tablet by mouth daily as needed for cough.     glipiZIDE 10 MG tablet  Commonly known as:  GLUCOTROL  Take 10 mg by mouth 2 (two) times daily.     glucose blood test strip  Use TID     metolazone 2.5 MG tablet  Commonly known as:  ZAROXOLYN  Take 1 tablet AS NEEDED with an additional 40 meq of K for weight gains of 3 lbs overnight or 5 lbs within one week.     ONETOUCH VERIO w/Device Kit  1 Act by Does not apply route 3 (three) times daily.     potassium chloride SA 20 MEQ tablet  Commonly known as:  K-DUR,KLOR-CON  Take 2 tablets (40 mEq total) by mouth 2 (two) times daily. Take an additional 40 meq of potassium on days you take metolazone.     spironolactone 25 MG tablet  Commonly known as:  ALDACTONE  Take 1 tablet (25 mg total) by mouth daily.     torsemide 20 MG tablet  Commonly known as:  DEMADEX  Take 4 tablets (80 mg total) by mouth 2 (two) times daily.        Disposition   The patient will be discharged in stable condition to home. Discharge Instructions    Amb Referral to Cardiac Rehabilitation     Complete by:  As directed   Diagnosis:  Heart Failure (see criteria below if ordering Phase II)  Heart Failure Type:  Chronic Systolic & Diastolic     Diet - low sodium heart healthy    Complete by:  As directed      Increase activity slowly    Complete by:  As directed           Follow-up Information    Follow up with  Glori Bickers, MD On 05/02/2016.   Specialty:  Cardiology   Why:  at 1000 am for post hospital follow up. Please bring all of your medications to your visit. The code for parking is 0020.   Contact information:   [address] [place] [address] [phone]       Follow up with Scarlette Calico, MD. Schedule an appointment as soon as possible for a visit in 1 week.   Specialty:  Internal Medicine   Contact information:   [address] [place] [address] [phone]         Duration of Discharge Encounter: Greater than 35 minutes   Signed, Shirley Friar PA-C 04/26/2016, 8:25 PM  Patient seen and examined with Oda Kilts, PA-C. We discussed all aspects of the encounter. I agree with the assessment and plan as stated above.   Volume status much improved after 53 pound diuresis. Renal function now stable but tenuous. Will need close f/u as outpatient. Reinforced need for daily weights and reviewed use of sliding scale diuretics.  Bensimhon, Daniel,MD 2:01 PM

## 2016-04-26 NOTE — Progress Notes (Signed)
Heart Failure Navigator Consult Note  Presentation: Christine Knight is a 65 yo woman with PMH of ischemic cardiomyopathy and chronic systolic heart failure with last known EF ~ 15-20%, CABG 08/2011, morbid obesity, pulmonary hypertension (PA pressure 72 mmHg), poorly controlled T2DM who presents with weight gain and dyspnea and was last seen in HF clinic 01/04/16 and 01/18/16 after a HF hospitalization 1/27 - 12/28/15 with 40 lb weight gain treated with lasix gtt at 30 mg/hr and metolazone 5 mg bid with some diamox with 70 lb diuresis and discharge weight of 237 lbs and RHC/LHC revealing preserved CO and mild/moderate pulmonary hypertension with severe native CAD and patent grafts. She weighed 234 lbs in HF clinic at that time and she endorses medication compliance, walking on the treadmill for 1/2 mile at 2mph. Her home diuretic is 40 mg bid torsemide. She was being treated with coreg 3.125 mg bid, bidil 1 tab tid, SL 12.5 mg, losartan 12.5 mg daily.   She says she's been gaining weight over the last one month. She's noticed early satiety and thinks things were triggered because of gout and joint/musculoskeletal pain. Her pain has improved but her dyspnea, orthopnea and PND have progressed. She denies sick contacts. She endorses medication compliance. She is accompanied by her daughter. She received 40 mg IV lasix in the ER with minimal response   Past Medical History  Diagnosis Date  . Obesity, morbid (HCC)   . Chronic systolic heart failure (HCC)     Chronic systolic CHF  . Hyperlipidemia 08/10/11  . Diabetes mellitus type 2 in obese (HCC) 08/10/11  . CAD (coronary artery disease) 08/2011    NSTEMI with subsequent CABG 08/13/2011 with LIMA-LAD, SVG-diagonal, SVG-OM, SVG-PDA  . Ischemic cardiomyopathy 08/13/2011    EF 20% in 08/2011, still 25% 10/21/2011  . Pleural effusion     Requiring L thoracentesis 09/02/11  . Mitral regurgitation     Mild by echo 10/21/11  . Hypertension   . Pulmonary  hypertension (HCC)     Echo, September, 2013, 72 mmHg.  Marland Kitchen. Ejection fraction < 50%     EF 20%, October, 2012  //  EF 25% December, 2012  //   EF 35%, echo, September, 2013  . Myocardial infarction (HCC) 08/2011  . Orthopnoea     "progressively worse over last 3 wks" (03/17/2013)  . CHF (congestive heart failure) (HCC)   . Carpal tunnel syndrome on both sides     "post OHS in 08/2011; resolved now" (03/17/2013)    Social History   Social History  . Marital Status: Divorced    Spouse Name: N/A  . Number of Children: 3  . Years of Education: N/A   Occupational History  . Bank teller    Social History Main Topics  . Smoking status: Former Smoker    Types: Cigarettes    Quit date: 11/11/1978  . Smokeless tobacco: Never Used     Comment: 03/17/2013 "only a social smoker when I did smoke; ever bought any"  . Alcohol Use: Yes     Comment: 03/17/2013 "in the last 6 months I've had 1 glass of wine"  . Drug Use: No  . Sexual Activity: Not Currently    Birth Control/ Protection: Post-menopausal   Other Topics Concern  . None   Social History Narrative   Lives with her sister and daughter.  Ambulates independently.    ECHO:Study Conclusions-04/14/16  - Left ventricle: The cavity size was mildly dilated. Wall  thickness was normal. Systolic  function was severely reduced. The  estimated ejection fraction was in the range of 15% to 20%.  Severe diffuse hypokinesis with no identifiable regional  variations. Features are consistent with a pseudonormal left  ventricular filling pattern, with concomitant abnormal relaxation  and increased filling pressure (grade 2 diastolic dysfunction). - Ventricular septum: The contour showed diastolic flattening and  systolic flattening. These changes are consistent with RV volume  and pressure overload. - Mitral valve: There was malcoaptation of the valve leaflets.  There was mild to moderate regurgitation directed posteriorly. - Left atrium:  The atrium was moderately dilated. - Right ventricle: The cavity size was severely dilated. Systolic  function was severely reduced. - Right atrium: The atrium was severely dilated. - Tricuspid valve: There was malcoaptation of the valve leaflets.  There was moderate regurgitation directed centrally. - Pulmonary arteries: Systolic pressure was severely increased. PA  peak pressure: 73 mm Hg (S).  Transthoracic echocardiography. M-mode, complete 2D, spectral Doppler, and color Doppler. Birthdate: Patient birthdate: 09/29/1951. Age: Patient is 64 yr old. Sex: Gender: female. BMI: 44 kg/m^2. Blood pressure: 98/55 Patient status: Inpatient. Study date: Study date: 04/14/2016. Study time: 02:31 PM. Location: Bedside.  BNP    Component Value Date/Time   BNP 1176.5* 04/11/2016 1929    ProBNP    Component Value Date/Time   PROBNP 1322.0* 03/17/2013 1152     Education Assessment and Provision:  Detailed education and instructions provided on heart failure disease management including the following:  Signs and symptoms of Heart Failure When to call the physician Importance of daily weights Low sodium diet Fluid restriction Medication management Anticipated future follow-up appointments  Patient education given on each of the above topics.  Patient acknowledges understanding and acceptance of all instructions.  I spoke with Ms. Wiatrek regarding her HF.  She is known to the [facility]. She has a scale and says she weighs daily.  I reminded her the importance of daily weights as well as when to contact the clinic.  I reviewed a low sodium diet and high sodium foods to avoid.  She admits that she is "low on certain meds" and requests to speak with pharmacist about what meds she will be on at discharge.  I will ask pharmacist to see her before discharge.  She will follow in the AHF Clinic.  Education Materials:  "Living Better With Heart Failure" Booklet, Daily  Weight Tracker Tool    High Risk Criteria for Readmission and/or Poor Patient Outcomes:  EF <30%- Yes 15-20%  2 or more admissions in 6 months-2/6 mo  Difficult social situation- No  Demonstrates medication noncompliance- No denies    Barriers of Care:   Knowledge and compliance.  Discharge Planning: Knowledge and compliance.  She has refused HH and will be referred to paramedicine for ongoing education, symptom recognition and compliance reinforcement.  She is aware and agreeable to Paramedicine.

## 2016-04-30 ENCOUNTER — Other Ambulatory Visit: Payer: Self-pay | Admitting: Internal Medicine

## 2016-04-30 ENCOUNTER — Telehealth (HOSPITAL_COMMUNITY): Payer: Self-pay | Admitting: Surgery

## 2016-04-30 NOTE — Telephone Encounter (Signed)
I attempted to contact Christine Knight to check on her after her recent hospitalization.  I left message and asked her to return call if she has any concerns or questions regarding her HF.

## 2016-05-02 ENCOUNTER — Ambulatory Visit (HOSPITAL_COMMUNITY)
Admission: RE | Admit: 2016-05-02 | Discharge: 2016-05-02 | Disposition: A | Discharge status: Home / Self Care | Payer: Medicaid Other | Source: Ambulatory Visit | Attending: Cardiology | Admitting: Cardiology

## 2016-05-02 DIAGNOSIS — Z6835 Body mass index (BMI) 35.0-35.9, adult: Secondary | ICD-10-CM | POA: Diagnosis not present

## 2016-05-02 DIAGNOSIS — N183 Chronic kidney disease, stage 3 unspecified: Secondary | ICD-10-CM | POA: Insufficient documentation

## 2016-05-02 DIAGNOSIS — I5022 Chronic systolic (congestive) heart failure: Secondary | ICD-10-CM

## 2016-05-02 DIAGNOSIS — Z951 Presence of aortocoronary bypass graft: Secondary | ICD-10-CM | POA: Diagnosis not present

## 2016-05-02 DIAGNOSIS — E1122 Type 2 diabetes mellitus with diabetic chronic kidney disease: Secondary | ICD-10-CM | POA: Diagnosis not present

## 2016-05-02 DIAGNOSIS — I272 Other secondary pulmonary hypertension: Secondary | ICD-10-CM | POA: Insufficient documentation

## 2016-05-02 DIAGNOSIS — Z7982 Long term (current) use of aspirin: Secondary | ICD-10-CM | POA: Diagnosis not present

## 2016-05-02 DIAGNOSIS — I130 Hypertensive heart and chronic kidney disease with heart failure and stage 1 through stage 4 chronic kidney disease, or unspecified chronic kidney disease: Secondary | ICD-10-CM | POA: Insufficient documentation

## 2016-05-02 DIAGNOSIS — I214 Non-ST elevation (NSTEMI) myocardial infarction: Secondary | ICD-10-CM | POA: Diagnosis not present

## 2016-05-02 DIAGNOSIS — J90 Pleural effusion, not elsewhere classified: Secondary | ICD-10-CM | POA: Insufficient documentation

## 2016-05-02 DIAGNOSIS — I255 Ischemic cardiomyopathy: Secondary | ICD-10-CM | POA: Diagnosis not present

## 2016-05-02 DIAGNOSIS — I2510 Atherosclerotic heart disease of native coronary artery without angina pectoris: Secondary | ICD-10-CM | POA: Insufficient documentation

## 2016-05-02 DIAGNOSIS — G5600 Carpal tunnel syndrome, unspecified upper limb: Secondary | ICD-10-CM | POA: Insufficient documentation

## 2016-05-02 DIAGNOSIS — I252 Old myocardial infarction: Secondary | ICD-10-CM | POA: Insufficient documentation

## 2016-05-02 DIAGNOSIS — E785 Hyperlipidemia, unspecified: Secondary | ICD-10-CM | POA: Insufficient documentation

## 2016-05-02 NOTE — Progress Notes (Signed)
Patient ID: Bali Lyn, female   DOB: 03-24-1951, 65 y.o.   MRN: [id number] Patient ID: Reatha Sur, female   DOB: 16-Jun-1951, 65 y.o.   MRN: [id number]    Advanced Heart Failure Clinic Note   PCP: None HF: Dr. Gala Romney   HPI: Ms.Soller is a 65 y.o. female w/ PMHx significant for morbid obesity, systolic HF due to ICM (10/12 EF 25%; 07/2012 EF 35%; 03/18/13 EF 20-25%), CAD with h/o NSTEMI s/p CABG Oct 2012, pulmonary hypertension (PA peak ), and poorly controlled DM 2.  She was admitted to [facility] in 2014 for dyspnea, leg edema, and weight gain.  Lasix gtt used and she diuresed 15 liters. Diuresed 29 pounds.  ECHO- EF 20-25%.  Discharge weight 264 pounds. Cleda Daub stopped d/t dizziness and hyperkalemia   Admitted 12/08/2015 - 12/28/2015 with worsening DOE and cough. Stated she had a 40 lb weight since 07/2015. She was diuresed aggressively with lasix gtt at 30 mg/hr and metolazone 5 mg BID. Diamox eventually added. Diuretics held temporarily towards end of stay with worsening Creatinine. Overall she diuresed >40 L and was down 70 lbs from admission weight. Pt had R/LHC with preserved CO and mild/mod Pulm HTN. Severe native CAD with patent CABG grafts. Discharge weight 237 lbs.   Admitted 6/1 -04/26/2016 with marked volume overload, low output hf, cardiorenal syndrome. Diuresed with high dose lasix 160 mg three times a day. Diuresed over 50 pounds. Transitioned to torsemide 80 mg daily. BB and losartan stopped. Discharge weight 222 pounds   She presents today for post hospital follow up. Overall feeling pretty good.  Mild dyspnea with steps. Denies pnd/orthopnea. Weight at home 219-220 pounds. Taking all medication but unsure about medications. Tries to follow low salt diet and limit fluid intake to < 2 liters per day.  Does not have HH.   Labs 2/17 K 4.3, Cr 1.56 Labs 04/26/2016: K 3.1 Creatinine 1.78 K 3.1  03/18/13 ECHO EF 20-25% 06/17/2013 ECHO EF 35% RV ok (read formally after visit  35-40%) 03/15/2015 ECHO EF 30-35%, grade 3 DD, Moderate TR, Severely reduced RV, PA peak pressure 81 mm Hg 12/09/2015 ECHO EF 15-20%, Decreased LV diastolic compliance, RV mod reduced, PA peak 57 mmHg, Severe TR   [facility] 12/13/15 Hemodynamics RA 25/25 (23) RV pressure 92/13  RV EDP 26 PA pressure 86/34 (56) PW mean 37 AO Pressure 114/72 (88) LV Pressure 101/12 (26) LV EDP 26 PVR 3.24 Fick CO/CI 5.85 / 2.41L/min   Prox LAD lesion, 100% stenosed. Prox Cx lesion, 99% stenosed. Mid RCA lesion, 80% stenosed. Dist RCA lesion, 100% stenosed. Patent grafts  ROS: All systems negative except as listed in HPI, PMH and Problem List.  Past Medical History  Diagnosis Date  . Obesity, morbid (HCC)   . Chronic systolic heart failure (HCC)     Chronic systolic CHF  . Hyperlipidemia 08/10/11  . Diabetes mellitus type 2 in obese (HCC) 08/10/11  . CAD (coronary artery disease) 08/2011    NSTEMI with subsequent CABG 08/13/2011 with LIMA-LAD, SVG-diagonal, SVG-OM, SVG-PDA  . Ischemic cardiomyopathy 08/13/2011    EF 20% in 08/2011, still 25% 10/21/2011  . Pleural effusion     Requiring L thoracentesis 09/02/11  . Mitral regurgitation     Mild by echo 10/21/11  . Hypertension   . Pulmonary hypertension (HCC)     Echo, September, 2013, 72 mmHg.  Marland Kitchen Ejection fraction < 50%     EF 20%, October, 2012  //  EF 25% December, 2012  //  EF 35%, echo, September, 2013  . Myocardial infarction ([place]) 08/2011  . Orthopnoea     "progressively worse over last 3 wks" (03/17/2013)  . CHF (congestive heart failure) ([place])   . Carpal tunnel syndrome on both sides     "post OHS in 08/2011; resolved now" (03/17/2013)    Current Outpatient Prescriptions  Medication Sig Dispense Refill  . acetaminophen (TYLENOL) 500 MG tablet Take 1,000 mg by mouth every 12 (twelve) hours as needed for mild pain or moderate pain.    Marland Kitchen aspirin 81 MG tablet Take 1 tablet (81 mg total) by mouth daily. 30 tablet 9  . atorvastatin (LIPITOR)  20 MG tablet Take 20 mg by mouth daily.    . Blood Glucose Monitoring Suppl (ONETOUCH VERIO) W/DEVICE KIT 1 Act by Does not apply route 3 (three) times daily. 2 kit 0  . glipiZIDE (GLUCOTROL) 10 MG tablet Take 1 tablet (10 mg total) by mouth 2 (two) times daily before a meal. Yearly physical due in Sept. Must see MD for refills 180 tablet 0  . glucose blood test strip Use TID 100 each 12  . metolazone (ZAROXOLYN) 2.5 MG tablet Take 1 tablet AS NEEDED with an additional 40 meq of K for weight gains of 3 lbs overnight or 5 lbs within one week. 15 tablet 3  . Multiple Vitamins-Minerals (CENTRUM SILVER 50+WOMEN PO) Take 1 tablet by mouth every morning.    . potassium chloride SA (K-DUR,KLOR-CON) 20 MEQ tablet Take 2 tablets (40 mEq total) by mouth 2 (two) times daily. Take an additional 40 meq of potassium on days you take metolazone. 140 tablet 6  . spironolactone (ALDACTONE) 25 MG tablet Take 1 tablet (25 mg total) by mouth daily. 30 tablet 6  . torsemide (DEMADEX) 20 MG tablet Take 4 tablets (80 mg total) by mouth 2 (two) times daily. 240 tablet 6  . [DISCONTINUED] simvastatin (ZOCOR) 20 MG tablet Take 1 tablet (20 mg total) by mouth every evening. 90 tablet 1   No current facility-administered medications for this encounter.     PHYSICAL EXAM: Filed Vitals:   05/02/16 1009  BP: 118/72  Pulse: 91  Weight: 221 lb (100.245 kg)  SpO2: 94%   General:NAD. Ambulated in the clinic without difficulty.  HEENT: normal Neck: JVP 6-7 cm. Carotids 2+ bilat; no bruits. No thyromegaly or nodule noted. Cor: PMI nondisplaced. Distant, Regular with PVCs, slightly brady. No M/G/R Lungs: CTAB, normal effort Abdomen: Morbidly obese, soft, NT, ND, no HSM. No bruits or masses. +BS  Extremities: no cyanosis, clubbing, rash. No edema.  Neuro: alert & orientedx3, cranial nerves grossly intact. moves all 4 extremities w/o difficulty. Affect pleasant  ASSESSMENT/PLAN  1. Chronic systolic HF due to iCM EF  15-20% Echo 12/09/15. Recent admit for low out put HF and diuresed over 50 pounds.Hospital course complicated by cardiorenal failure.  BB and ARB stopped.    NYHA II-III.  Volume status stable. Continue 40 mg torsemide + metolazone as needed for 3 pound weight gain.  - Continue K 40 meq twice a day.  Also 25 mg spiro daily - Hold off on coreg this visit and consider starting soon.  - No ace/arb with CKD. Check BMET today.   - At one point she was on bidil but difficulty with dizziness and hypotension.  2. CAD s/p NSTEMI and CABG 10/12  - Severe native CAD with patent grafts on LHC 12/13/15 - Continue ASA 81 mg BID and atorvastatin 10 mg daily.  3.  Morbid obesity - Encouraged to watch portions and continue to increase activity.   4. Pulmonary HTN - mild to mod RHC 12/13/15. PVR 3.24.  5. CKD stage III - Repeat BMET today 6. DM2  7. Social - on medical leave from work. Now has medicaid.   Follow up in 1 week with pharmacy.  Follow up in 4 weeks. Refer to paramedicine. At risk for readmit due to poor insight. Refer to HF SW  Phallon Haydu, NP-C  10:35 AM

## 2016-05-02 NOTE — Progress Notes (Signed)
Advanced Heart Failure Medication Review by a Pharmacist  Does the patient  feel that his/her medications are working for him/her?  yes  Has the patient been experiencing any side effects to the medications prescribed?  no  Does the patient measure his/her own blood pressure or blood glucose at home?  no   Does the patient have any problems obtaining medications due to transportation or finances?   no  Understanding of regimen: poor Understanding of indications: poor Potential of compliance: poor Patient understands to avoid NSAIDs. Patient understands to avoid decongestants.  Pharmacist comments: Christine Knight is a pleasant [phone] yof presenting to clinic for a follow-up appointment. She was recently discharged form the ICU where she was a patient for ~3 weeks. She states that she is feeling much better but still regaining some of her strength. She was previously on carvedilol and losartan but both medications have been hels since the patient was discharged from the hospital. She denies any SOB, swelling or dizziness. She states that she is somewhat confused about her medication regimen. I spoke with her about each of her medications and made sure they all had enough refills.  Christine Knight did not have any other medication-related questions or concerns at this time.   Jonaven Hilgers C. Marvis MoellerMiles, PharmD Pharmacy Resident  Pager: [phone] 05/02/2016 10:34 AM   Time with patient: 10 min  Preparation and documentation time: 2 min  Total time: 12 min

## 2016-05-02 NOTE — Patient Instructions (Signed)
Your physician recommends that you schedule a follow-up appointment in: 1 week with CHF pharmacist  Your physician recommends that you schedule a follow-up appointment in: 4 weeks In the [facility]   Do the following things EVERYDAY: 1) Weigh yourself in the morning before breakfast. Write it down and keep it in a log. 2) Take your medicines as prescribed 3) Eat low salt foods-Limit salt (sodium) to 2000 mg per day.  4) Stay as active as you can everyday 5) Limit all fluids for the day to less than 2 liters 6)

## 2016-05-06 ENCOUNTER — Encounter (HOSPITAL_COMMUNITY): Payer: BLUE CROSS/BLUE SHIELD | Admitting: Internal Medicine

## 2016-05-06 ENCOUNTER — Telehealth: Payer: Self-pay | Admitting: Licensed Clinical Social Worker

## 2016-05-06 NOTE — Telephone Encounter (Signed)
CSW received referral to assist with referral to paramedicine. [facility]. Florentina [facility]isonKatie states she will will meet patient at next clinic visit on Wednesday June 28 at 2:30pm. CSW will continue to coordinate services with paramedicine program. Lasandra BeechJackie Alaria Oconnor, LCSW [phone]

## 2016-05-08 ENCOUNTER — Ambulatory Visit (HOSPITAL_COMMUNITY)
Admission: RE | Admit: 2016-05-08 | Discharge: 2016-05-08 | Disposition: A | Discharge status: Home / Self Care | Payer: Medicaid Other | Source: Ambulatory Visit | Attending: Internal Medicine | Admitting: Internal Medicine

## 2016-05-08 ENCOUNTER — Encounter: Payer: Self-pay | Admitting: Licensed Clinical Social Worker

## 2016-05-08 ENCOUNTER — Encounter (HOSPITAL_COMMUNITY): Payer: Self-pay

## 2016-05-08 VITALS — BP 120/80 | HR 70 | Wt 216.0 lb

## 2016-05-08 DIAGNOSIS — I272 Other secondary pulmonary hypertension: Secondary | ICD-10-CM | POA: Diagnosis not present

## 2016-05-08 DIAGNOSIS — I5022 Chronic systolic (congestive) heart failure: Secondary | ICD-10-CM | POA: Insufficient documentation

## 2016-05-08 DIAGNOSIS — E1122 Type 2 diabetes mellitus with diabetic chronic kidney disease: Secondary | ICD-10-CM | POA: Diagnosis not present

## 2016-05-08 DIAGNOSIS — N183 Chronic kidney disease, stage 3 (moderate): Secondary | ICD-10-CM | POA: Insufficient documentation

## 2016-05-08 DIAGNOSIS — Z951 Presence of aortocoronary bypass graft: Secondary | ICD-10-CM | POA: Insufficient documentation

## 2016-05-08 DIAGNOSIS — Z7984 Long term (current) use of oral hypoglycemic drugs: Secondary | ICD-10-CM | POA: Insufficient documentation

## 2016-05-08 DIAGNOSIS — I2510 Atherosclerotic heart disease of native coronary artery without angina pectoris: Secondary | ICD-10-CM | POA: Diagnosis not present

## 2016-05-08 LAB — BASIC METABOLIC PANEL
Anion gap: 9 (ref 5–15)
BUN: 49 mg/dL — AB (ref 6–20)
CALCIUM: 10.2 mg/dL (ref 8.9–10.3)
CHLORIDE: 102 mmol/L (ref 101–111)
CO2: 26 mmol/L (ref 22–32)
CREATININE: 1.47 mg/dL — AB (ref 0.44–1.00)
GFR calc non Af Amer: 37 mL/min — ABNORMAL LOW (ref >60)
GFR, EST AFRICAN AMERICAN: 42 mL/min — AB (ref >60)
Glucose, Bld: 104 mg/dL — ABNORMAL HIGH (ref 65–99)
Potassium: 4.8 mmol/L (ref 3.5–5.1)
SODIUM: 137 mmol/L (ref 135–145)

## 2016-05-08 MED ORDER — CARVEDILOL 3.125 MG PO TABS: 3.1250 mg | ORAL_TABLET | Freq: Two times a day (BID) | ORAL | Status: DC

## 2016-05-08 MED FILL — CARVEDILOL 3.125 MG TABLET: 3.125 | 30 days supply | Qty: 60 | Fill #0

## 2016-05-08 NOTE — Progress Notes (Signed)
CSW and Katie from paramedicine program met with patient in the clinic to discuss the program. Patient states she was recently hospitalized and was very confused. Patient reports she has improved tremendously since last visit. Patient states she lives with her sister and daughter who are very supportive. She is driving and able to pick up medications at the pharmacy and denies any concerns with compliance with taking meds or making medical appointments. Patient fills her own medication box and has a list of all her prescribed medications. She states she weighs herself daily and tracks her weight. She also has a gym membership to [employer] and goes everyday and has worked up to 20 minutes daily exercise. She reported that she has lost 5lbs and attributes to her exercise and eating sensibly. Patient denies need for paramedicine program and will reach out to CSW if needs develop down the road. CSW available if needed. Jackie Brennan, LCSW [phone]    

## 2016-05-08 NOTE — Patient Instructions (Signed)
It was great seeing you today!  START taking carvedilol 3.125mg  twice daily. Call the clinic with any persistent dizziness or lightheadedness.   Follow-up with Tonye BecketAmy Clegg, NP on 06/05/13.

## 2016-05-08 NOTE — Progress Notes (Signed)
HPI:  [name] is a 65 y.o. AA. female w/ PMHx significant for morbid obesity, systolic HF due to ICM (10/12 EF 25%; 6/17 EF 15-20%), CAD with h/o NSTEMI s/p CABG Oct 2012, pulmonary hypertension (PA peak 72mmHg), and poorly controlled DM 2.  [facility] for dyspnea, leg edema, and weight gain. Diuresed 29 pounds. ECHO- EF 20-25%.She was previously on carvedilol and losartan that were discontinued in the hospital 2/2 low output and AKI.   She presents today for PharmD medication titration appointment. At her last HF clinic visit, her HF regimen was not changed. She is feeling much better today and her spirits are lifted since her last appointment. She states that she has been walking 20 minutes/day on the treadmill and attributes her 5 pound weight loss to this. She has not had to take any PRN doses of her metolazone.     . Shortness of breath/dyspnea on exertion? Yes - when she tries to "do too much"  . Orthopnea/PND? No . Edema? No . Lightheadedness/dizziness? No . Daily weights at home? Yes (states 220lbs but scale is broken) . Blood pressure/heart rate monitoring at home? No . Following low-sodium/fluid-restricted diet? Yes  HF Medications: Spironolactone 25mg  daily Torsemide 80mg  BID K-Dur 40 meq BID (additional 40 meq with metolazone) Metolazone 2.5mg  PRN (5lbs in 1 week or 3 lbs overnight) - none needed recently  Has the patient been experiencing any side effects to the medications prescribed?  no  Does the patient have any problems obtaining medications due to transportation or finances?   no  Understanding of regimen: good Understanding of indications: good Potential of compliance: good Patient understands to avoid NSAIDs. Patient understands to avoid decongestants.  Pertinent Lab Values:  05/08/16: SCr 1.47 (BL 1.2-1.4) much improved, K 4.8, Na 137, BUN 49     Vital Signs: . Weight: 216lbs (last HFc weight: 221 lbs) . Blood pressure: 120/80  mmHg . Heart rate: 70 bpm  Heart Failure Assessment & Plan: . Ejection fraction: 15-20% (04/14/16) . NYHA symptom class: II . Based on clinical presentation, vital signs, and recent labs, will recommend to restart carvedilol 3.125 mg PO BID.   Summary: 1. 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 Volume status stable.  - Continue torsemide 80 mg BID + metolazone as needed for 3 pound weight gain in 1 day or 5 lbs in 1 week - Continue K 40 meq twice a day, spironolactone 25 mg daily - Restart carvedilol 3.125 mg PO BID - Consider addition of ARB/ARNI once AKI resolved - Was on bidil in the past but difficulty with dizziness and hypotension - Basic disease state pathophysiology, medication indication, mechanism and side effects reviewed at length with patient and she verbalized understanding 2. CAD s/p NSTEMI and CABG 10/12  - Severe native CAD with patent grafts on LHC 12/13/15 - Continue ASA 81 mg BID and atorvastatin 10 mg daily 3. Morbid obesity - Encouraged to watch portions and continue to increase activity.  4. Pulmonary HTN - Mild to mod RHC 12/13/15. PVR 3.24 WU.  5. CKD stage III - Repeat BMET today - SCr much improved 6. DM2  - Continue glipizide per PCP 7. Social - On medical leave from work. Now has assistance for Medicare premiums - Annice PihJackie and Florentina AddisonKatie to see today   1) Medication changes: Restart carvedilol 3.125mg  BID  2) Labs: BMET today  3) Follow-up: 3 weeks with Tonye BecketAmy Clegg, NP   Seen with Meagan  Marvis MoellerMiles, PharmD PGY1 Resident   Tyler DeisErika K. Bonnye FavaNicolsen, PharmD, BCPS, CPP Clinical Pharmacist Pager: [phone] Phone: [phone] 05/08/2016 3:54 PM

## 2016-05-30 MED FILL — KLOR-CON M20 TABLET: 20 | 30 days supply | Qty: 140 | Fill #1

## 2016-05-30 MED FILL — TORSEMIDE 20 MG TABLET: 20 | 30 days supply | Qty: 240 | Fill #1

## 2016-05-30 MED FILL — SPIRONOLACTONE 25 MG TABLET: 25 | 30 days supply | Qty: 30 | Fill #1

## 2016-06-05 ENCOUNTER — Encounter (HOSPITAL_COMMUNITY): Payer: Self-pay

## 2016-06-05 ENCOUNTER — Ambulatory Visit (HOSPITAL_COMMUNITY)
Admission: RE | Admit: 2016-06-05 | Discharge: 2016-06-05 | Disposition: A | Discharge status: Home / Self Care | Payer: Medicaid Other | Source: Ambulatory Visit | Attending: Cardiology | Admitting: Cardiology

## 2016-06-05 VITALS — BP 116/76 | HR 70 | Ht 66.0 in | Wt 242.8 lb

## 2016-06-05 DIAGNOSIS — I272 Other secondary pulmonary hypertension: Secondary | ICD-10-CM | POA: Insufficient documentation

## 2016-06-05 DIAGNOSIS — I130 Hypertensive heart and chronic kidney disease with heart failure and stage 1 through stage 4 chronic kidney disease, or unspecified chronic kidney disease: Secondary | ICD-10-CM | POA: Insufficient documentation

## 2016-06-05 DIAGNOSIS — Z951 Presence of aortocoronary bypass graft: Secondary | ICD-10-CM | POA: Diagnosis not present

## 2016-06-05 DIAGNOSIS — E875 Hyperkalemia: Secondary | ICD-10-CM | POA: Insufficient documentation

## 2016-06-05 DIAGNOSIS — I2510 Atherosclerotic heart disease of native coronary artery without angina pectoris: Secondary | ICD-10-CM | POA: Diagnosis not present

## 2016-06-05 DIAGNOSIS — I252 Old myocardial infarction: Secondary | ICD-10-CM | POA: Diagnosis not present

## 2016-06-05 DIAGNOSIS — E1122 Type 2 diabetes mellitus with diabetic chronic kidney disease: Secondary | ICD-10-CM | POA: Insufficient documentation

## 2016-06-05 DIAGNOSIS — R42 Dizziness and giddiness: Secondary | ICD-10-CM | POA: Insufficient documentation

## 2016-06-05 DIAGNOSIS — I5043 Acute on chronic combined systolic (congestive) and diastolic (congestive) heart failure: Secondary | ICD-10-CM

## 2016-06-05 DIAGNOSIS — I255 Ischemic cardiomyopathy: Secondary | ICD-10-CM | POA: Diagnosis not present

## 2016-06-05 DIAGNOSIS — M7989 Other specified soft tissue disorders: Secondary | ICD-10-CM | POA: Diagnosis not present

## 2016-06-05 DIAGNOSIS — I5022 Chronic systolic (congestive) heart failure: Secondary | ICD-10-CM | POA: Insufficient documentation

## 2016-06-05 DIAGNOSIS — N183 Chronic kidney disease, stage 3 unspecified: Secondary | ICD-10-CM

## 2016-06-05 DIAGNOSIS — E785 Hyperlipidemia, unspecified: Secondary | ICD-10-CM | POA: Diagnosis not present

## 2016-06-05 DIAGNOSIS — I340 Nonrheumatic mitral (valve) insufficiency: Secondary | ICD-10-CM | POA: Insufficient documentation

## 2016-06-05 DIAGNOSIS — G5600 Carpal tunnel syndrome, unspecified upper limb: Secondary | ICD-10-CM | POA: Diagnosis not present

## 2016-06-05 LAB — CBC
HEMATOCRIT: 41 % (ref 36.0–46.0)
HEMOGLOBIN: 13.2 g/dL (ref 12.0–15.0)
MCH: 30.6 pg (ref 26.0–34.0)
MCHC: 32.2 g/dL (ref 30.0–36.0)
MCV: 94.9 fL (ref 78.0–100.0)
Platelets: 234 10*3/uL (ref 150–400)
RBC: 4.32 MIL/uL (ref 3.87–5.11)
RDW: 16.3 % — ABNORMAL HIGH (ref 11.5–15.5)
WBC: 4.6 10*3/uL (ref 4.0–10.5)

## 2016-06-05 LAB — BASIC METABOLIC PANEL
Anion gap: 9 (ref 5–15)
BUN: 29 mg/dL — AB (ref 6–20)
CHLORIDE: 105 mmol/L (ref 101–111)
CO2: 22 mmol/L (ref 22–32)
Calcium: 9.6 mg/dL (ref 8.9–10.3)
Creatinine, Ser: 1.25 mg/dL — ABNORMAL HIGH (ref 0.44–1.00)
GFR calc Af Amer: 52 mL/min — ABNORMAL LOW (ref >60)
GFR, EST NON AFRICAN AMERICAN: 44 mL/min — AB (ref >60)
GLUCOSE: 129 mg/dL — AB (ref 65–99)
POTASSIUM: 4.3 mmol/L (ref 3.5–5.1)
Sodium: 136 mmol/L (ref 135–145)

## 2016-06-05 LAB — MAGNESIUM: MAGNESIUM: 2.2 mg/dL (ref 1.7–2.4)

## 2016-06-05 LAB — BRAIN NATRIURETIC PEPTIDE: B NATRIURETIC PEPTIDE 5: 906.9 pg/mL — AB (ref 0.0–100.0)

## 2016-06-05 MED ORDER — FUROSEMIDE 10 MG/ML IJ SOLN
80.0000 mg | Freq: Once | INTRAMUSCULAR | Status: AC
  Administered 2016-06-05: 80 mg via INTRAVENOUS
  Filled 2016-06-05: qty 8

## 2016-06-05 NOTE — Progress Notes (Signed)
PIV 22g started in LAC x 1 attempt by RN Caryl Comes. 80 mg IV lasix pushed over 4 minutes, flushed, and capped by RN Caryl Comes. Patient tolerated well, call bell in reach, meal given to patient during lunchtime as she is diabetic. Bathroom oriented to patient for strict output measurement. Will continue to monitor closely.  Total UOP: 700 cc  PIV DC'd before patient left OV.  Magda Bernheim M

## 2016-06-05 NOTE — Progress Notes (Signed)
Patient ID: Christine Knight, female   DOB: June 08, 1951, 65 y.o.   MRN: [id number]    Advanced Heart Failure Clinic Note   PCP: None HF: Dr. Haroldine Laws   HPI: Christine Knight is a 65 y.o. female w/ PMHx significant for morbid obesity, systolic HF due to ICM (47/42 EF 25%; 07/2012 EF 35%; 03/18/13 EF 20-25%), CAD with h/o NSTEMI s/p CABG Oct 2012, pulmonary hypertension (PA peak 67mHg), and poorly controlled DM 2.  [facility], and [facility].  Lasix gtt used and she diuresed 15 liters. Diuresed 29 pounds.  ECHO- EF 20-25%.  Discharge weight 264 pounds. Christine Knight d/t dizziness and hyperkalemia   Admitted 12/08/2015 - 12/28/2015 with worsening DOE and cough. Stated she had a 40 lb weight since 07/2015. She was diuresed aggressively with lasix gtt at 30 mg/hr and metolazone 5 mg BID. Diamox eventually added. Diuretics held temporarily towards end of stay with worsening Creatinine. Overall she diuresed >40 L and was down 70 lbs from admission weight. Pt had R/LHC with preserved CO and mild/mod Pulm HTN. Severe native CAD with patent CABG grafts. Discharge weight 237 lbs.   Admitted 6/1 -04/26/2016 with marked volume overload, low output hf, cardiorenal syndrome. Diuresed with high dose lasix 160 mg three times a day. Diuresed over 50 pounds. Transitioned to torsemide 80 mg daily. BB and losartan stopped. Discharge weight 222 pounds   She presents today for regular follow up.  At last visit (pharmacy) had coreg added back)   She is up 26 lbs from that visit. She has not been weighing daily, it has been at least 3 weeks since she checked ( was 216 at that time). She has not taken any metolazone. She has been having worsening arthritis pain in her ankles and knees and attributed her swelling to that.  Despite this she states that her breathing is OK. Denies orthopnea. Does have dyspnea with steps, but more fatigued. Has been eating poorly (frozen dinners) for the past few  weeks with her arthritis pain.   Labs 2/17 K 4.3, Cr 1.56 Labs 04/26/2016: K 3.1 Creatinine 1.78 K 3.1  03/18/13 ECHO EF 20-25% 06/17/2013 ECHO EF 35% RV ok (read formally after visit 35-40%) 03/15/2015 ECHO EF 30-35%, grade 3 DD, Moderate TR, Severely reduced RV, PA peak pressure 81 mm Hg 12/09/2015 ECHO EF 15-20%, Decreased LV diastolic compliance, RV mod reduced, PA peak 57 mmHg, Severe TR  04/14/16 Echo 15-20%   R/LHC 12/13/15 Hemodynamics RA 25/25 (23) RV pressure 92/13  RV EDP 26 PA pressure 86/34 (56) PW mean 37 AO Pressure 114/72 (88) LV Pressure 101/12 (26) LV EDP 26 PVR 3.24 Fick CO/CI 5.85 / 2.41L/min   Prox LAD lesion, 100% stenosed. Prox Cx lesion, 99% stenosed. Mid RCA lesion, 80% stenosed. Dist RCA lesion, 100% stenosed. Patent grafts  ROS: All systems negative except as listed in HPI, PMH and Problem List.  Past Medical History:  Diagnosis Date  . CAD (coronary artery disease) 08/2011   NSTEMI with subsequent CABG 08/13/2011 with LIMA-LAD, SVG-diagonal, SVG-OM, SVG-PDA  . Carpal tunnel syndrome on both sides    "post OHS in 08/2011; resolved now" (03/17/2013)  . CHF (congestive heart failure) ([place]   . Chronic systolic heart failure (HCC)    Chronic systolic CHF  . Diabetes mellitus type 2 in obese ([place] 08/10/11  . Ejection fraction < 50%    EF 20%, October, 2012  //  EF 25% December, 2012  //  EF 35%, echo, September, 2013  . Hyperlipidemia 08/10/11  . Hypertension   . Ischemic cardiomyopathy 08/13/2011   EF 20% in 08/2011, still 25% 10/21/2011  . Mitral regurgitation    Mild by echo 10/21/11  . Myocardial infarction ([place]) 08/2011  . Obesity, morbid ([place])   . Orthopnoea    "progressively worse over last 3 wks" (03/17/2013)  . Pleural effusion    Requiring L thoracentesis 09/02/11  . Pulmonary hypertension ([place])    Echo, September, 2013, 72 mmHg.    Current Outpatient Prescriptions  Medication Sig Dispense Refill  . acetaminophen (TYLENOL) 500 MG tablet  Take 1,000 mg by mouth every 12 (twelve) hours as needed for mild pain or moderate pain.    Marland Kitchen aspirin 81 MG tablet Take 1 tablet (81 mg total) by mouth daily. 30 tablet 9  . atorvastatin (LIPITOR) 20 MG tablet Take 20 mg by mouth daily.    . Blood Glucose Monitoring Suppl (ONETOUCH VERIO) W/DEVICE KIT 1 Act by Does not apply route 3 (three) times daily. 2 kit 0  . carvedilol (COREG) 3.125 MG tablet Take 1 tablet (3.125 mg total) by mouth 2 (two) times daily. 60 tablet 5  . glipiZIDE (GLUCOTROL) 10 MG tablet Take 1 tablet (10 mg total) by mouth 2 (two) times daily before a meal. Yearly physical due in Sept. Must see MD for refills 180 tablet 0  . glucose blood test strip Use TID 100 each 12  . Multiple Vitamins-Minerals (CENTRUM SILVER 50+WOMEN PO) Take 1 tablet by mouth every morning.    . potassium chloride SA (K-DUR,KLOR-CON) 20 MEQ tablet Take 2 tablets (40 mEq total) by mouth 2 (two) times daily. Take an additional 40 meq of potassium on days you take metolazone. 140 tablet 6  . spironolactone (ALDACTONE) 25 MG tablet Take 1 tablet (25 mg total) by mouth daily. 30 tablet 6  . torsemide (DEMADEX) 20 MG tablet Take 4 tablets (80 mg total) by mouth 2 (two) times daily. 240 tablet 6  . metolazone (ZAROXOLYN) 2.5 MG tablet Take 1 tablet AS NEEDED with an additional 40 meq of K for weight gains of 3 lbs overnight or 5 lbs within one week. (Patient not taking: Reported on 06/05/2016) 15 tablet 3   No current facility-administered medications for this encounter.      PHYSICAL EXAM: Vitals:   06/05/16 1143  BP: 116/76  BP Location: Left Arm  Patient Position: Sitting  Cuff Size: Large  Pulse: 70  SpO2: 93%  Weight: 242 lb 12.8 oz (110.1 kg)  Height: '5\' 6"'$  (1.676 m)   Wt Readings from Last 3 Encounters:  06/05/16 242 lb 12.8 oz (110.1 kg)  05/08/16 216 lb (98 kg)  05/02/16 221 lb (100.2 kg)     General:NAD. Ambulated in the clinic without difficulty.  HEENT: normal Neck: JVP to ear  Carotids 2+ bilat; no bruits. No thyromegaly or lymphadenopathy noted. Cor: PMI nondisplaced. Distant, RRR. No M/G/R Lungs: Diminished bibasilar sounds, with basilar crackles Abdomen: Morbidly obese, soft, non-tender, mild/mod distended, no HSM. No bruits or masses. +BS  Extremities: no cyanosis, clubbing, rash. 2+ edema to knees, 1+ edema into thighs. Neuro: alert & orientedx3, cranial nerves grossly intact. moves all 4 extremities w/o difficulty. Affect pleasant  ASSESSMENT/PLAN  1. Acute on chronic systolic HF due to iCM EF 15-20% Echo 12/09/15. Recent admit for low out put HF and diuresed over 50 pounds.Hospital course complicated by cardiorenal failure.  BB and ARB stopped.    NYHA II-III.  Volume status markedly elevated. Has not taken any metolazone as she has been directed.  - She refused admission. Given 80 mg IV lasix in clinic with good response.  - Continue 80 mg torsemide BID.  Take 2.5 mg metolazone with extra 40 meq of potassium for next two days. - Continue K 40 meq twice a day.   - Continue 25 mg spiro daily - Continue coreg 3.215 mg BID.  - No ace/arb with CKD. Check BMET today.   - At one point she was on bidil but difficulty with dizziness and hypotension.  - Stressed importance of weighing daily and limiting salt/fluid intake.  Has been eating mostly frozen meals. 2. CAD s/p NSTEMI and CABG 10/12  - Severe native CAD with patent grafts on LHC 12/13/15 - Continue ASA 81 mg BID and atorvastatin 10 mg daily.  3. Morbid obesity - Encouraged to watch portions and increase activity.   4. Pulmonary HTN - mild to mod RHC 12/13/15. PVR 3.24.  5. CKD stage III - Repeat BMET today 6. DM2  7. Social - Remains out of work. Now has medicaid.   BMET, BNP, and CBC today. Creatinine stable, BNP pending but she is markedly volume overloaded on exam. Got IV lasix 80 mg in clinic with good response. Refused admission. Will follow up next week to reassess volume status and follow labs  with extra metolazone.   Shirley Friar, PA-C  11:48 AM  Total time spent > 40 minutes. Over half that spent discussing the above.

## 2016-06-05 NOTE — Patient Instructions (Addendum)
Take metolazone 2.5 mg tablet TOMORROW AND FRIDAY with EXTRA 40 meq (2 tabs) potassium.  Follow up next week with [name] NP-C.  Do the following things EVERYDAY: 1) Weigh yourself in the morning before breakfast. Write it down and keep it in a log. Bring this log in with you to next week's appointment. 2) Take your medicines as prescribed 3) Eat low salt foods-Limit salt (sodium) to 2000 mg per day.  4) Stay as active as you can everyday 5) Limit all fluids for the day to less than 2 liters

## 2016-06-13 ENCOUNTER — Ambulatory Visit (HOSPITAL_COMMUNITY)
Admission: RE | Admit: 2016-06-13 | Discharge: 2016-06-13 | Disposition: A | Discharge status: Home / Self Care | Payer: Medicaid Other | Source: Ambulatory Visit | Attending: Cardiology | Admitting: Cardiology

## 2016-06-13 VITALS — BP 124/84 | HR 70 | Resp 20 | Wt 232.1 lb

## 2016-06-13 DIAGNOSIS — N289 Disorder of kidney and ureter, unspecified: Secondary | ICD-10-CM

## 2016-06-13 DIAGNOSIS — Z7982 Long term (current) use of aspirin: Secondary | ICD-10-CM | POA: Insufficient documentation

## 2016-06-13 DIAGNOSIS — I130 Hypertensive heart and chronic kidney disease with heart failure and stage 1 through stage 4 chronic kidney disease, or unspecified chronic kidney disease: Secondary | ICD-10-CM | POA: Insufficient documentation

## 2016-06-13 DIAGNOSIS — Z79899 Other long term (current) drug therapy: Secondary | ICD-10-CM | POA: Insufficient documentation

## 2016-06-13 DIAGNOSIS — I2510 Atherosclerotic heart disease of native coronary artery without angina pectoris: Secondary | ICD-10-CM | POA: Insufficient documentation

## 2016-06-13 DIAGNOSIS — I252 Old myocardial infarction: Secondary | ICD-10-CM | POA: Insufficient documentation

## 2016-06-13 DIAGNOSIS — I272 Other secondary pulmonary hypertension: Secondary | ICD-10-CM

## 2016-06-13 DIAGNOSIS — E785 Hyperlipidemia, unspecified: Secondary | ICD-10-CM | POA: Insufficient documentation

## 2016-06-13 DIAGNOSIS — E1122 Type 2 diabetes mellitus with diabetic chronic kidney disease: Secondary | ICD-10-CM | POA: Insufficient documentation

## 2016-06-13 DIAGNOSIS — Z6837 Body mass index (BMI) 37.0-37.9, adult: Secondary | ICD-10-CM | POA: Insufficient documentation

## 2016-06-13 DIAGNOSIS — I255 Ischemic cardiomyopathy: Secondary | ICD-10-CM | POA: Insufficient documentation

## 2016-06-13 DIAGNOSIS — R5383 Other fatigue: Secondary | ICD-10-CM | POA: Insufficient documentation

## 2016-06-13 DIAGNOSIS — Z7984 Long term (current) use of oral hypoglycemic drugs: Secondary | ICD-10-CM | POA: Insufficient documentation

## 2016-06-13 DIAGNOSIS — I5043 Acute on chronic combined systolic (congestive) and diastolic (congestive) heart failure: Secondary | ICD-10-CM

## 2016-06-13 DIAGNOSIS — N183 Chronic kidney disease, stage 3 (moderate): Secondary | ICD-10-CM | POA: Insufficient documentation

## 2016-06-13 DIAGNOSIS — I5022 Chronic systolic (congestive) heart failure: Secondary | ICD-10-CM | POA: Insufficient documentation

## 2016-06-13 DIAGNOSIS — Z951 Presence of aortocoronary bypass graft: Secondary | ICD-10-CM | POA: Insufficient documentation

## 2016-06-13 LAB — BASIC METABOLIC PANEL
Anion gap: 11 (ref 5–15)
BUN: 46 mg/dL — AB (ref 6–20)
CO2: 27 mmol/L (ref 22–32)
Calcium: 9.9 mg/dL (ref 8.9–10.3)
Chloride: 99 mmol/L — ABNORMAL LOW (ref 101–111)
Creatinine, Ser: 1.49 mg/dL — ABNORMAL HIGH (ref 0.44–1.00)
GFR calc Af Amer: 42 mL/min — ABNORMAL LOW (ref >60)
GFR, EST NON AFRICAN AMERICAN: 36 mL/min — AB (ref >60)
Glucose, Bld: 66 mg/dL (ref 65–99)
POTASSIUM: 4 mmol/L (ref 3.5–5.1)
SODIUM: 137 mmol/L (ref 135–145)

## 2016-06-13 MED ORDER — SACUBITRIL-VALSARTAN 24-26 MG PO TABS: 1.0000 | ORAL_TABLET | Freq: Two times a day (BID) | ORAL | 6 refills | Status: DC

## 2016-06-13 NOTE — Patient Instructions (Signed)
Labs today and again in 1 week  START Entresto 24/26 mg, one tab twice a day  Your physician has recommended that you have a cardiopulmonary stress test (CPX). CPX testing is a non-invasive measurement of heart and lung function. It replaces a traditional treadmill stress test. This type of test provides a tremendous amount of information that relates not only to your present condition but also for future outcomes. This test combines measurements of you ventilation, respiratory gas exchange in the lungs, electrocardiogram (EKG), blood pressure and physical response before, during, and following an exercise protocol.  Your physician has requested that you have an echocardiogram. Echocardiography is a painless test that uses sound waves to create images of your heart. It provides your doctor with information about the size and shape of your heart and how well your heart's chambers and valves are working. This procedure takes approximately one hour. There are no restrictions for this procedure.  Your physician recommends that you schedule a follow-up appointment in: 2 weeks with the NP [name] Your physician recommends that you schedule a follow-up appointment in: 6 weeks with Dr Gala Romney

## 2016-06-13 NOTE — Progress Notes (Signed)
Patient ID: Christine Knight, female   DOB: 1951/02/23, 65 y.o.   MRN: [id number]     Advanced Heart Failure Clinic Note   PCP: None HF: Dr. Haroldine Knight   HPI: Christine Knight is a 65 y.o. female w/ PMHx significant for morbid obesity, systolic HF due to ICM (84/53 EF 25%; 07/2012 EF 35%; 03/18/13 EF 20-25%), CAD with h/o NSTEMI s/p CABG Oct 2012, pulmonary hypertension (PA peak 60mHg), and poorly controlled DM 2.  [facility].  Lasix gtt used and she diuresed 15 liters. Diuresed 29 pounds.  ECHO- EF 20-25%.  Discharge weight 264 pounds. Christine Knight d/t dizziness and hyperkalemia   Admitted 12/08/2015 - 12/28/2015 with worsening DOE and cough. Stated she had a 40 lb weight since 07/2015. She was diuresed aggressively with lasix gtt at 30 mg/hr and metolazone 5 mg BID. Diamox eventually added. Diuretics held temporarily towards end of stay with worsening Creatinine. Overall she diuresed >40 L and was down 70 lbs from admission weight. Pt had R/LHC with preserved CO and mild/mod Pulm HTN. Severe native CAD with patent CABG grafts. Discharge weight 237 lbs.   Admitted 6/1 -04/26/2016 with marked volume overload, low output hf, cardiorenal syndrome. Diuresed with high dose lasix 160 mg three times a day. Diuresed over 50 pounds. Transitioned to torsemide 80 mg daily. BB and losartan stopped. Discharge weight 222 pounds   She presents for HF follow up. Last visit  She was given IV lasix in the clinic. She was offered hospital admit however she refused.  Complains of fatigue. SOB after walking 20 minutes. Appetite ok. Weight at home trending down from 242 to 228 pounds. Taking all medications. Lives with sister and daughter.   Labs 2/17 K 4.3, Cr 1.56 Labs 04/26/2016: K 3.1 Creatinine 1.78 K 3.1 Labs 06/05/2016: K 4.3 Creatinine 1.25   03/18/13 ECHO EF 20-25% 06/17/2013 ECHO EF 35% RV ok (read formally after visit 35-40%) 03/15/2015 ECHO EF 30-35%, grade 3  DD, Moderate TR, Severely reduced RV, PA peak pressure 81 mm Hg 12/09/2015 ECHO EF 15-20%, Decreased LV diastolic compliance, RV mod reduced, PA peak 57 mmHg, Severe TR  04/14/16 Echo 15-20%   R/LHC 12/13/15 Hemodynamics RA 25/25 (23) RV pressure 92/13  RV EDP 26 PA pressure 86/34 (56) PW mean 37 AO Pressure 114/72 (88) LV Pressure 101/12 (26) LV EDP 26 PVR 3.24 Fick CO/CI 5.85 / 2.41L/min   Prox LAD lesion, 100% stenosed. Prox Cx lesion, 99% stenosed. Mid RCA lesion, 80% stenosed. Dist RCA lesion, 100% stenosed. Patent grafts  ROS: All systems negative except as listed in HPI, PMH and Problem List.  Past Medical History:  Diagnosis Date  . CAD (coronary artery disease) 08/2011   NSTEMI with subsequent CABG 08/13/2011 with LIMA-LAD, SVG-diagonal, SVG-OM, SVG-PDA  . Carpal tunnel syndrome on both sides    "post OHS in 08/2011; resolved now" (03/17/2013)  . CHF (congestive heart failure) ([place]   . Chronic systolic heart failure (HCC)    Chronic systolic CHF  . Diabetes mellitus type 2 in obese ([place] 08/10/11  . Ejection fraction < 50%    EF 20%, October, 2012  //  EF 25% December, 2012  //   EF 35%, echo, September, 2013  . Hyperlipidemia 08/10/11  . Hypertension   . Ischemic cardiomyopathy 08/13/2011   EF 20% in 08/2011, still 25% 10/21/2011  . Mitral regurgitation    Mild by echo 10/21/11  . [place]  08[place]  . Obesity, morbid ([place])   . Orthopnoea    "progressively worse over last 3 wks" (03/17/2013)  . Pleural effusion    Requiring L thoracentesis 09/02/11  . Pulmonary hypertension ([place])    Echo, September, 2013, 72 mmHg.    Current Outpatient Prescriptions  Medication Sig Dispense Refill  . acetaminophen (TYLENOL) 500 MG tablet Take 1,000 mg by mouth every 12 (twelve) hours as needed for mild pain or moderate pain.    Marland Kitchen aspirin 81 MG tablet Take 1 tablet (81 mg total) by mouth daily. 30 tablet 9  . atorvastatin (LIPITOR) 20 MG tablet Take 20 mg by  mouth daily.    . Blood Glucose Monitoring Suppl (ONETOUCH VERIO) W/DEVICE KIT 1 Act by Does not apply route 3 (three) times daily. 2 kit 0  . carvedilol (COREG) 3.125 MG tablet Take 1 tablet (3.125 mg total) by mouth 2 (two) times daily. 60 tablet 5  . glipiZIDE (GLUCOTROL) 10 MG tablet Take 1 tablet (10 mg total) by mouth 2 (two) times daily before a meal. Yearly physical due in Sept. Must see MD for refills 180 tablet 0  . glucose blood test strip Use TID 100 each 12  . metolazone (ZAROXOLYN) 2.5 MG tablet Take 1 tablet AS NEEDED with an additional 40 meq of K for weight gains of 3 lbs overnight or 5 lbs within one week. 15 tablet 3  . Multiple Vitamins-Minerals (CENTRUM SILVER 50+WOMEN PO) Take 1 tablet by mouth every morning.    . potassium chloride SA (K-DUR,KLOR-CON) 20 MEQ tablet Take 2 tablets (40 mEq total) by mouth 2 (two) times daily. Take an additional 40 meq of potassium on days you take metolazone. 140 tablet 6  . spironolactone (ALDACTONE) 25 MG tablet Take 1 tablet (25 mg total) by mouth daily. 30 tablet 6  . torsemide (DEMADEX) 20 MG tablet Take 4 tablets (80 mg total) by mouth 2 (two) times daily. 240 tablet 6   No current facility-administered medications for this encounter.      PHYSICAL EXAM: Vitals:   06/13/16 0958  BP: 124/84  BP Location: Right Arm  Patient Position: Sitting  Cuff Size: Normal  Pulse: 70  Resp: 20  SpO2: 96%  Weight: 232 lb 2 oz (105.3 kg)   Wt Readings from Last 3 Encounters:  06/13/16 232 lb 2 oz (105.3 kg)  06/05/16 242 lb 12.8 oz (110.1 kg)  05/08/16 216 lb (98 kg)     General:NAD. Ambulated in the clinic without difficulty.  HEENT: normal Neck: JVP ~10 Carotids 2+ bilat; no bruits. No thyromegaly or lymphadenopathy noted. Cor: PMI nondisplaced. Distant, RRR. No M/G/R Lungs: Clear Abdomen: Morbidly obese, soft, non-tender, mild/mod distended, no HSM. No bruits or masses. +BS  Extremities: no cyanosis, clubbing, rash. R and LLE 1+  edema. . Neuro: alert & orientedx3, cranial nerves grossly intact. moves all 4 extremities w/o difficulty. Affect pleasant  ASSESSMENT/PLAN 1. Chronic systolic HF due to iCM EF 15-20% Echo 04/14/2016.Recent admit for low out put HF and diuresed over 50 pounds.Hospital course complicated by cardiorenal failure.     NYHA II-III.  Volume status mildly elevated. Continue 80 mg torsemide BID.  Continue K 40 meq twice a day.   - Start entresto 24-26 mg twice a day. BMET today and in 1 week. Watch renal function.  - Continue 25 mg spiro daily - Continue coreg 3.125 mg BID.  - Intolerant bidil but difficulty with dizziness and hypotension.  - Plan to repeat ECHO  in 6 weeks after HF meds optimized.  -2. CAD s/p NSTEMI and CABG 10/12  - Severe native CAD with patent grafts on LHC 12/13/15. No CP - Continue ASA 81 mg BID and atorvastatin 10 mg daily.  3. Morbid obesity - Encouraged to watch portions and increase activity.   4. Pulmonary HTN - mild to mod RHC 12/13/15. PVR 3.24.  5. CKD stage III - Repeat BMET today 6. DM2  7. Day time fatigue- Set up for sleep study next visit after volume status improved.   Today check BMET and in 7 days. Set up CPX. Eventually needs sleep study. ECHO in 6 weeks and Dr Christine Knight.   Follow up 2 weeks  For additional med titration.  [place] Shannahan, NP-C   10:29 AM

## 2016-06-17 MED FILL — ENTRESTO 24 MG-26 MG TABLET: 24-26 | 30 days supply | Qty: 60 | Fill #0

## 2016-06-24 ENCOUNTER — Other Ambulatory Visit (HOSPITAL_COMMUNITY): Payer: Medicaid Other

## 2016-06-24 ENCOUNTER — Encounter (HOSPITAL_COMMUNITY): Payer: Medicaid Other

## 2016-06-27 ENCOUNTER — Telehealth (HOSPITAL_COMMUNITY): Payer: Self-pay | Admitting: *Deleted

## 2016-06-27 ENCOUNTER — Encounter (HOSPITAL_COMMUNITY): Payer: Medicaid Other

## 2016-06-27 NOTE — Telephone Encounter (Signed)
Pt called stating she feels bad, she was sch to see Tonye BecketAmy Clegg, NP this am but was unable to come b/c she feels so bad.  She reports nausea/vomiting/diarrhea for past 2 days, she states it has gotten better today but she feels very weak and dizzy when she goes to the bathroom.  She states she is unsure if she has had a fever but has been having the chills off/on followed by cold sweat.  Advised she should report to ER for further eval.  She is agreeable and will have her daughter bring her to ER

## 2016-07-05 MED FILL — SPIRONOLACTONE 25 MG TABLET: 25 | 30 days supply | Qty: 30 | Fill #2

## 2016-07-05 MED FILL — CARVEDILOL 3.125 MG TABLET: 3.125 | 30 days supply | Qty: 60 | Fill #1

## 2016-07-05 MED FILL — TORSEMIDE 20 MG TABLET: 20 | 30 days supply | Qty: 240 | Fill #2

## 2016-07-05 MED FILL — KLOR-CON M20 TABLET: 20 | 30 days supply | Qty: 140 | Fill #2

## 2016-07-11 ENCOUNTER — Encounter (HOSPITAL_COMMUNITY)
Admission: RE | Admit: 2016-07-11 | Discharge: 2016-07-11 | Disposition: A | Discharge status: Home / Self Care | Payer: Medicaid Other | Source: Ambulatory Visit | Attending: Internal Medicine | Admitting: Internal Medicine

## 2016-07-11 ENCOUNTER — Encounter (HOSPITAL_COMMUNITY): Payer: Self-pay

## 2016-07-11 VITALS — BP 108/72 | HR 65 | Ht 65.5 in | Wt 239.6 lb

## 2016-07-11 DIAGNOSIS — Z9889 Other specified postprocedural states: Secondary | ICD-10-CM | POA: Insufficient documentation

## 2016-07-11 DIAGNOSIS — I5022 Chronic systolic (congestive) heart failure: Secondary | ICD-10-CM

## 2016-07-11 DIAGNOSIS — I5020 Unspecified systolic (congestive) heart failure: Secondary | ICD-10-CM | POA: Insufficient documentation

## 2016-07-11 NOTE — Progress Notes (Signed)
Cardiac Individual Treatment Plan  Patient Details  Name: Christine Knight MRN: [id number] Date of Birth: 12/28/1950 Referring Provider:   Flowsheet Row CARDIAC REHAB PHASE II ORIENTATION from 07/11/2016 in [place]  Referring Provider  Bensimhon, Daniel MD      Initial Encounter Date:  [place] from 07/11/2016 in [place]  Date  07/11/16  Referring Provider  Glori Bickers MD      Visit Diagnosis: Heart failure, chronic systolic ([place])  Patient's Home Medications on Admission:  Current Outpatient Prescriptions:  .  acetaminophen (TYLENOL) 500 MG tablet, Take 1,000 mg by mouth every 12 (twelve) hours as needed for mild pain or moderate pain., Disp: , Rfl:  .  aspirin 81 MG tablet, Take 1 tablet (81 mg total) by mouth daily., Disp: 30 tablet, Rfl: 9 .  Blood Glucose Monitoring Suppl (ONETOUCH VERIO) W/DEVICE KIT, 1 Act by Does not apply route 3 (three) times daily., Disp: 2 kit, Rfl: 0 .  carvedilol (COREG) 3.125 MG tablet, Take 1 tablet (3.125 mg total) by mouth 2 (two) times daily., Disp: 60 tablet, Rfl: 5 .  glipiZIDE (GLUCOTROL) 10 MG tablet, Take 1 tablet (10 mg total) by mouth 2 (two) times daily before a meal. Yearly physical due in Sept. Must see MD for refills, Disp: 180 tablet, Rfl: 0 .  glucose blood test strip, Use TID, Disp: 100 each, Rfl: 12 .  metolazone (ZAROXOLYN) 2.5 MG tablet, Take 1 tablet AS NEEDED with an additional 40 meq of K for weight gains of 3 lbs overnight or 5 lbs within one week., Disp: 15 tablet, Rfl: 3 .  Multiple Vitamins-Minerals (CENTRUM SILVER 50+WOMEN PO), Take 1 tablet by mouth every morning., Disp: , Rfl:  .  potassium chloride SA (K-DUR,KLOR-CON) 20 MEQ tablet, Take 2 tablets (40 mEq total) by mouth 2 (two) times daily. Take an additional 40 meq of potassium on days you take metolazone., Disp: 140 tablet, Rfl: 6 .  sacubitril-valsartan  (ENTRESTO) 24-26 MG, Take 1 tablet by mouth 2 (two) times daily., Disp: 60 tablet, Rfl: 6 .  spironolactone (ALDACTONE) 25 MG tablet, Take 1 tablet (25 mg total) by mouth daily., Disp: 30 tablet, Rfl: 6 .  torsemide (DEMADEX) 20 MG tablet, Take 4 tablets (80 mg total) by mouth 2 (two) times daily., Disp: 240 tablet, Rfl: 6 .  atorvastatin (LIPITOR) 20 MG tablet, Take 20 mg by mouth daily., Disp: , Rfl:   Past Medical History: Past Medical History:  Diagnosis Date  . CAD (coronary artery disease) 08/2011   NSTEMI with subsequent CABG 08/13/2011 with LIMA-LAD, SVG-diagonal, SVG-OM, SVG-PDA  . Carpal tunnel syndrome on both sides    "post OHS in 08/2011; resolved now" (03/17/2013)  . CHF (congestive heart failure) ([place])   . Chronic systolic heart failure (HCC)    Chronic systolic CHF  . Diabetes mellitus type 2 in obese ([place]) 08/10/11  . Ejection fraction < 50%    EF 20%, October, 2012  //  EF 25% December, 2012  //   EF 35%, echo, September, 2013  . Hyperlipidemia 08/10/11  . Hypertension   . Ischemic cardiomyopathy 08/13/2011   EF 20% in 08/2011, still 25% 10/21/2011  . Mitral regurgitation    Mild by echo 10/21/11  . Myocardial infarction ([place]) 08/2011  . Obesity, morbid ([place])   . Orthopnoea    "progressively worse over last 3 wks" (03/17/2013)  . Pleural effusion  Requiring L thoracentesis 09/02/11  . Pulmonary hypertension ([place])    Echo, September, 2013, 72 mmHg.    Tobacco Use: History  Smoking Status  . Former Smoker  . Types: Cigarettes  . Quit date: 11/11/1978  Smokeless Tobacco  . Never Used    Comment: 03/17/2013 "only a social smoker when I did smoke; ever bought any"    Labs: Recent Review Flowsheet Data    Labs for ITP Cardiac and Pulmonary Rehab Latest Ref Rng & Units 04/22/2016 04/23/2016 04/24/2016 04/25/2016 04/26/2016   Cholestrol 0 - 200 mg/dL - - - - -   LDLCALC 0 - 99 mg/dL - - - - -   HDL >40 mg/dL - - - - -   Trlycerides <150 mg/dL - - - - -   Hemoglobin A1c  4.8 - 5.6 % - - - - -   PHART 7.350 - 7.450 - - - - -   PCO2ART 35.0 - 45.0 mmHg - - - - -   HCO3 20.0 - 24.0 mEq/L - - - - -   TCO2 0 - 100 mmol/L - - - - -   ACIDBASEDEF 0.0 - 2.0 mmol/L - - - - -   O2SAT % 80.1 73.3 63.8 60.8 68.8      Capillary Blood Glucose: Lab Results  Component Value Date   GLUCAP 178 (H) 04/26/2016   GLUCAP 185 (H) 04/26/2016   GLUCAP 107 (H) 04/25/2016   GLUCAP 136 (H) 04/25/2016   GLUCAP 160 (H) 04/25/2016     Exercise Target Goals: Date: 07/11/16  Exercise Program Goal: Individual exercise prescription set with THRR, safety & activity barriers. Participant demonstrates ability to understand and report RPE using BORG scale, to self-measure pulse accurately, and to acknowledge the importance of the exercise prescription.  Exercise Prescription Goal: Starting with aerobic activity 30 plus minutes a day, 3 days per week for initial exercise prescription. Provide home exercise prescription and guidelines that participant acknowledges understanding prior to discharge.  Activity Barriers & Risk Stratification:     Activity Barriers & Cardiac Risk Stratification - 07/11/16 0817      Activity Barriers & Cardiac Risk Stratification   Activity Barriers None   Cardiac Risk Stratification High      6 Minute Walk:     6 Minute Walk    Row Name 07/11/16 1220 07/11/16 1233       6 Minute Walk   Phase Initial  -    Distance 860 feet  -    Walk Time 6 minutes  -    # of Rest Breaks 0  -    MPH 1.6  -    METS 1.49  -    RPE 13  -    Perceived Dyspnea   - 1    VO2 Peak 5.2  -    Symptoms Yes (comment)  -    Comments fatigue  -    Resting HR 65 bpm  -    Resting BP 108/72  -    Max Ex. HR 83 bpm  -    Max Ex. BP 110/70  -    2 Minute Post BP 98/60  -       Initial Exercise Prescription:     Initial Exercise Prescription - 07/11/16 1200      Date of Initial Exercise RX and Referring Provider   Date 07/11/16   Referring Provider  Glori Bickers MD     Recumbant Bike   Level  1   Watts 10   Minutes 10   METs 2.29     NuStep   Level 2   Minutes 10   METs 2     Track   Laps 7   Minutes 10   METs 2.2     Prescription Details   Frequency (times per week) 3   Duration Progress to 30 minutes of continuous aerobic without signs/symptoms of physical distress     Intensity   THRR 40-80% of Max Heartrate 62-125   Ratings of Perceived Exertion 11-13   Perceived Dyspnea 0-4     Progression   Progression Continue to progress workloads to maintain intensity without signs/symptoms of physical distress.     Resistance Training   Training Prescription Yes   Weight 1   Reps 10-12      Perform Capillary Blood Glucose checks as needed.  Exercise Prescription Changes:   Exercise Comments:   Discharge Exercise Prescription (Final Exercise Prescription Changes):   Nutrition:  Target Goals: Understanding of nutrition guidelines, daily intake of sodium 1500mg , cholesterol 200mg , calories 30% from fat and 7% or less from saturated fats, daily to have 5 or more servings of fruits and vegetables.  Biometrics:     Pre Biometrics - 07/11/16 1234      Pre Biometrics   Waist Circumference 47 inches   Hip Circumference 48.5 inches   Waist to Hip Ratio 0.97 %   Triceps Skinfold 37 mm   % Body Fat 50.4 %   Grip Strength 19 kg   Flexibility 8 in   Single Leg Stand 2.65 seconds       Nutrition Therapy Plan and Nutrition Goals:   Nutrition Discharge: Nutrition Scores:   Nutrition Goals Re-Evaluation:   Psychosocial: Target Goals: Acknowledge presence or absence of depression, maximize coping skills, provide positive support system. Participant is able to verbalize types and ability to use techniques and skills needed for reducing stress and depression.  Initial Review & Psychosocial Screening:     Initial Psych Review & Screening - 07/11/16 1716      Family Dynamics   Good Support System?  Yes      Quality of Life Scores:   PHQ-9: Recent Review Flowsheet Data    Depression screen [facility] 2/9 01/04/2016 02/17/2012   Decreased Interest 0 0   Down, Depressed, Hopeless 0 0   PHQ - 2 Score 0 0      Psychosocial Evaluation and Intervention:   Psychosocial Re-Evaluation:   Vocational Rehabilitation: Provide vocational rehab assistance to qualifying candidates.   Vocational Rehab Evaluation & Intervention:   Education: Education Goals: Education classes will be provided on a weekly basis, covering required topics. Participant will state understanding/return demonstration of topics presented.  Learning Barriers/Preferences:     Learning Barriers/Preferences - 07/11/16 0817      Learning Barriers/Preferences   Learning Barriers Sight   Learning Preferences Skilled Demonstration      Education Topics: Count Your Pulse:  -Group instruction provided by verbal instruction, demonstration, patient participation and written materials to support subject.  Instructors address importance of being able to find your pulse and how to count your pulse when at home without a heart monitor.  Patients get hands on experience counting their pulse with staff help and individually.   Heart Attack, Angina, and Risk Factor Modification:  -Group instruction provided by verbal instruction, video, and written materials to support subject.  Instructors address signs and symptoms of angina and heart attacks.  Also discuss risk factors for heart disease and how to make changes to improve heart health risk factors.   Functional Fitness:  -Group instruction provided by verbal instruction, demonstration, patient participation, and written materials to support subject.  Instructors address safety measures for doing things around the house.  Discuss how to get up and down off the floor, how to pick things up properly, how to safely get out of a chair without assistance, and balance  training.   Meditation and Mindfulness:  -Group instruction provided by verbal instruction, patient participation, and written materials to support subject.  Instructor addresses importance of mindfulness and meditation practice to help reduce stress and improve awareness.  Instructor also leads participants through a meditation exercise.    Stretching for Flexibility and Mobility:  -Group instruction provided by verbal instruction, patient participation, and written materials to support subject.  Instructors lead participants through series of stretches that are designed to increase flexibility thus improving mobility.  These stretches are additional exercise for major muscle groups that are typically performed during regular warm up and cool down.   Hands Only CPR Anytime:  -Group instruction provided by verbal instruction, video, patient participation and written materials to support subject.  Instructors co-teach with AHA video for hands only CPR.  Participants get hands on experience with mannequins.   Nutrition I class: Heart Healthy Eating:  -Group instruction provided by PowerPoint slides, verbal discussion, and written materials to support subject matter. The instructor gives an explanation and review of the Therapeutic Lifestyle Changes diet recommendations, which includes a discussion on lipid goals, dietary fat, sodium, fiber, plant stanol/sterol esters, sugar, and the components of a well-balanced, healthy diet.   Nutrition II class: Lifestyle Skills:  -Group instruction provided by PowerPoint slides, verbal discussion, and written materials to support subject matter. The instructor gives an explanation and review of label reading, grocery shopping for heart health, heart healthy recipe modifications, and ways to make healthier choices when eating out.   Diabetes Question & Answer:  -Group instruction provided by PowerPoint slides, verbal discussion, and written materials to  support subject matter. The instructor gives an explanation and review of diabetes co-morbidities, pre- and post-prandial blood glucose goals, pre-exercise blood glucose goals, signs, symptoms, and treatment of hypoglycemia and hyperglycemia, and foot care basics.   Diabetes Blitz:  -Group instruction provided by PowerPoint slides, verbal discussion, and written materials to support subject matter. The instructor gives an explanation and review of the physiology behind type 1 and type 2 diabetes, diabetes medications and rational behind using different medications, pre- and post-prandial blood glucose recommendations and Hemoglobin A1c goals, diabetes diet, and exercise including blood glucose guidelines for exercising safely.    Portion Distortion:  -Group instruction provided by PowerPoint slides, verbal discussion, written materials, and food models to support subject matter. The instructor gives an explanation of serving size versus portion size, changes in portions sizes over the last 20 years, and what consists of a serving from each food group.   Stress Management:  -Group instruction provided by verbal instruction, video, and written materials to support subject matter.  Instructors review role of stress in heart disease and how to cope with stress positively.     Exercising on Your Own:  -Group instruction provided by verbal instruction, power point, and written materials to support subject.  Instructors discuss benefits of exercise, components of exercise, frequency and intensity of exercise, and end points for exercise.  Also discuss use of nitroglycerin and activating EMS.  Review  options of places to exercise outside of rehab.  Review guidelines for sex with heart disease.   Cardiac Drugs I:  -Group instruction provided by verbal instruction and written materials to support subject.  Instructor reviews cardiac drug classes: antiplatelets, anticoagulants, beta blockers, and statins.   Instructor discusses reasons, side effects, and lifestyle considerations for each drug class.   Cardiac Drugs II:  -Group instruction provided by verbal instruction and written materials to support subject.  Instructor reviews cardiac drug classes: angiotensin converting enzyme inhibitors (ACE-I), angiotensin II receptor blockers (ARBs), nitrates, and calcium channel blockers.  Instructor discusses reasons, side effects, and lifestyle considerations for each drug class.   Anatomy and Physiology of the Circulatory System:  -Group instruction provided by verbal instruction, video, and written materials to support subject.  Reviews functional anatomy of heart, how it relates to various diagnoses, and what role the heart plays in the overall system.   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 and lose 8lbs; Long: lose 20lbs, feel better   Intervention provide exercise programming and nutrition guidelines to aid in overall cardiovascular fitness and weight loss   Expected Outcomes patient will be able to fish and lose 8-10lbs      Core Components/Risk Factors/Patient Goals Review:    Core Components/Risk Factors/Patient Goals at Discharge (Final Review):    ITP Comments:     ITP Comments    Row Name 07/11/16 0815           ITP Comments Dr. Sheppard Evens Director          Comments:  Pt in today for cardiac rehab orientation from [phone].  As a part of the orientation appt pt completed 6 minute walk test.  Pt tolerated walk with some complaints of feeling tired toward the end.  Pt given cart to use after completing 2 laps.  Pt denies any sob or cp.  Monitor shows SR with notched QRS that is present on pt most recent 12  lead ekg. Brief Psychosocial assessment -  Pt interacted positively with other participants and was engaged in the presentation.Pt is a prior participant in cardiac rehab.   Pt did the program in 2012 but was unable to finish due to her work schedule.  Pt unable to complete her HW and will return the information on her first day of exercise. Cherre Huger, BSN

## 2016-07-11 NOTE — Progress Notes (Signed)
Cardiac Rehab Medication Review by a Pharmacist  Does the patient  feel that his/her medications are working for him/her?  yes  Has the patient been experiencing any side effects to the medications prescribed?  Yes, lightheadedness to the beta-blocker  Does the patient measure his/her own blood pressure or blood glucose at home?  yes   Does the patient have any problems obtaining medications due to transportation or finances?   yes  Understanding of regimen: good Understanding of indications: good Potential of compliance: good    Pharmacist comments: BM is a [age] yoF who presents in good spirits. She endorses compliance to most of her medications. She is not sure which statin she takes, but does take a statin. Informed her to call back with the statin she takes. She also does not check her BS three times daily, but does endorse checking twice daily. Checks BP every so often, but educated to check at least 3x weekly.     Alfredo BachJoseph Loyal Holzheimer, Cleotis NipperBS, PharmD Clinical Pharmacy Resident [phone] (Pager) 07/11/2016 8:54 AM

## 2016-07-17 ENCOUNTER — Encounter (HOSPITAL_COMMUNITY)
Admission: RE | Admit: 2016-07-17 | Discharge: 2016-07-17 | Disposition: A | Discharge status: Home / Self Care | Payer: Medicare Other | Source: Ambulatory Visit | Attending: Internal Medicine | Admitting: Internal Medicine

## 2016-07-17 ENCOUNTER — Telehealth: Payer: Self-pay | Admitting: Internal Medicine

## 2016-07-17 DIAGNOSIS — I5020 Unspecified systolic (congestive) heart failure: Secondary | ICD-10-CM | POA: Insufficient documentation

## 2016-07-17 DIAGNOSIS — Z9889 Other specified postprocedural states: Secondary | ICD-10-CM | POA: Insufficient documentation

## 2016-07-17 DIAGNOSIS — I5022 Chronic systolic (congestive) heart failure: Secondary | ICD-10-CM

## 2016-07-17 LAB — GLUCOSE, CAPILLARY
GLUCOSE-CAPILLARY: 65 mg/dL (ref 65–99)
Glucose-Capillary: 84 mg/dL (ref 65–99)
Glucose-Capillary: 60 mg/dL — ABNORMAL LOW (ref 65–99)

## 2016-07-17 MED FILL — ENTRESTO 24 MG-26 MG TABLET: 24-26 | 30 days supply | Qty: 60 | Fill #1

## 2016-07-17 NOTE — Progress Notes (Signed)
Christine Knight 65 y.o. female Nutrition Note Spoke with pt due to hypoglycemia before exercise. Pt asymptomatic yet hypoglycemia s/s can be masked by beta blocker pt takes. Pt ate a bowl of oatmeal at 0800 and did not have anything else to eat before coming to exercise. Pre-exercise CBG was 65 mg/dL. Pt given 3 rolls of sweet tarts ( 18 gm CHO). Pt CBG increased to 84 mg/dL. Pt advised to eat a meal/snack containing protein, fat, and carbs within 30 minutes of treating hypoglycemia. Pt educated re: appropriate pre-exercise snack choices to eat 1 hour before exercise. Pt educated re: how sulfonylureas work to lower CBG's. Pt states "I don't want to stick myself." Pt's concerns re: insulin use discussed. Pt wants an endocrinologist and/or a new PCP. A list of some of the Endocrinologists in the area provided. Pt expressed understanding of the information reviewed via feedback method. Continue client-centered nutrition education by RD as part of interdisciplinary care.  Monitor and evaluate progress toward nutrition goal with team.  Christine Knight, M.Ed, RD, LDN, CDE 07/17/2016 12:33 PM Lab Results  Component Value Date   HGBA1C 8.3 (H) 12/09/2015

## 2016-07-17 NOTE — Telephone Encounter (Signed)
Cardiac rehab called and said that pt sugar was low today at visit.  She put all the results in epic.  This is just a FYI for Dr Yetta Barrejones

## 2016-07-17 NOTE — Progress Notes (Signed)
Incomplete Session Note  Patient Details  Name: Christine Knight MRN: [id number] Date of Birth: 22-Sep-1951 Referring Provider:   Flowsheet Row [facility]ARDIAC [facility]  Referring Provider  Bensimhon, Reuel Boomaniel MD      Christine Knight did not complete her rehab session.  CBG 65. No exercise per protocol. Christine Knight was counseled by the dietitian and  advised to eat 1 hour before coming to exercise. [name] ate oatmeal for breakfast at 0800. Christine Knight was given 3 packs of sweet tarts and is asymptomatic. Recheck CBG 84. Christine Knight was given lemonade to drink and graham crackers. Dr Sanda Lingerhomas Jones office was called and notified about today's CBG's. Christine Knight plans to go home and eat lunch. Christine Knight understands to eat a well balanced meal prior to returning to exercise on Friday.Gladstone LighterMaria Whitaker, RN,BSN 07/17/2016 5:58 PM

## 2016-07-19 ENCOUNTER — Encounter (HOSPITAL_COMMUNITY)
Admission: RE | Admit: 2016-07-19 | Discharge: 2016-07-19 | Disposition: A | Discharge status: Home / Self Care | Payer: Medicare Other | Source: Ambulatory Visit | Attending: Internal Medicine | Admitting: Internal Medicine

## 2016-07-19 DIAGNOSIS — I5022 Chronic systolic (congestive) heart failure: Secondary | ICD-10-CM

## 2016-07-19 DIAGNOSIS — I5020 Unspecified systolic (congestive) heart failure: Secondary | ICD-10-CM | POA: Diagnosis not present

## 2016-07-19 LAB — GLUCOSE, CAPILLARY
GLUCOSE-CAPILLARY: 70 mg/dL (ref 65–99)
Glucose-Capillary: 95 mg/dL (ref 65–99)

## 2016-07-19 NOTE — Progress Notes (Signed)
Incomplete Session Note  Patient Details  Name: Lum BabeBarbara Mayotte MRN: [id number] Date of Birth: [birth year] Referring Provider:   Flowsheet Row CARDIAC REHAB PHASE II [facility]  Referring Provider  Bensimhon, Reuel Boomaniel MD      Lum BabeBarbara Palomino did not complete her rehab session.  Pt pre exercise blood glucose is 70. Pt is asymptomatic and was surprised that it was that low.  Pt did not check her blood glucose at home.  Pt ate shredded wheat and peaches.  Pt given lemonade and peanut butter with graham crackers.  Pt attended diabetes education class with the diabetes educator and registered dietician.  Post education class pt blood glucose 95. Pt unable to exercise today due to hypoglycemia protocol for cardiac rehab. Asked Marily Memosdna, registered dietician, diabetes educator to counsel pt regarding medications for exercise days and appropriate food choices. Alanson Alyarlette Desree Leap RN, BSN

## 2016-07-22 ENCOUNTER — Encounter (HOSPITAL_COMMUNITY)
Admission: RE | Admit: 2016-07-22 | Discharge: 2016-07-22 | Disposition: A | Discharge status: Home / Self Care | Payer: Medicare Other | Source: Ambulatory Visit | Attending: Internal Medicine | Admitting: Internal Medicine

## 2016-07-22 DIAGNOSIS — I5020 Unspecified systolic (congestive) heart failure: Secondary | ICD-10-CM | POA: Diagnosis not present

## 2016-07-22 DIAGNOSIS — I5022 Chronic systolic (congestive) heart failure: Secondary | ICD-10-CM

## 2016-07-22 LAB — GLUCOSE, CAPILLARY
GLUCOSE-CAPILLARY: 158 mg/dL — AB (ref 65–99)
GLUCOSE-CAPILLARY: 133 mg/dL — AB (ref 65–99)

## 2016-07-22 NOTE — Progress Notes (Signed)
Pt returned to exercise today.  Pt checked her blood glucose at home.  Pt states it was 141 after she ate breakfast.  Pt held her glipizide as instructed by edna, diabetes educator.  Pt pre exercise blood glucose was 158.  Pt ok to exercise.  Pt surprised herself and did not think she was able to complete three stations of exercise along with weights.  Pt post exercise was 141.  Pt plans to eat some lunch at home and take her glipizide.  Pt wonders if she "needs" to take the glipizide due to running low.  Advised pt to continue to take as directed and record blood glucose readings. Based up readings of blood glucose later in the day will send to Md for review of decreasing pt dose.  Verbalized understanding. Karlene Linemanarlette Carlton RN

## 2016-07-24 ENCOUNTER — Encounter (HOSPITAL_COMMUNITY)
Admission: RE | Admit: 2016-07-24 | Discharge: 2016-07-24 | Disposition: A | Discharge status: Home / Self Care | Payer: Medicare Other | Source: Ambulatory Visit | Attending: Internal Medicine | Admitting: Internal Medicine

## 2016-07-24 DIAGNOSIS — I5020 Unspecified systolic (congestive) heart failure: Secondary | ICD-10-CM | POA: Diagnosis not present

## 2016-07-24 DIAGNOSIS — I5022 Chronic systolic (congestive) heart failure: Secondary | ICD-10-CM

## 2016-07-24 LAB — GLUCOSE, CAPILLARY
GLUCOSE-CAPILLARY: 83 mg/dL (ref 65–99)
GLUCOSE-CAPILLARY: 82 mg/dL (ref 65–99)

## 2016-07-24 NOTE — Progress Notes (Signed)
Pt returned to exercise today.  Pt attending cardiac rehab education class.  Pt stated home cbg 109 after eating breakfast.  Pt got an apple pie from [employer] on her way to exercise.  Pt stated that she did not take her glipizide prior to exercise.  Pt blood glucose checked after first station 83. Pt given banana and lemonade. Dietician consult provided. Recheck after 15 minutes.  Pt blood glucose remained unchanged 83.  Pt advised to check her blood glucose upon returning home and eat a meal with protein.  Pt denies any symptoms and felt able to drive.  Will call pt later.  Karlene Linemanarlette Elie Gragert RN, BSN  Sent in basket to Dr. Gala RomneyBensimhon and amy clegg regarding pt sinus brady.  Pt Hr mid 40's.  Pt feels overall fatigue and feels she is in a "fog".  Pt takes Correg 3.125 mg twice a day and will have a follow up appt on 9/20 in the HF clinic. Alanson Alyarlette Ronya Gilcrest RN, BSN

## 2016-07-24 NOTE — Progress Notes (Signed)
Britta MccreedyBarbara Muscat 65 y.o. female Nutrition Note Spoke with pt. Pt reports eating breakfast, holding Glipizide, and post-prandial CBG was 109 mg/dL. Pt ate an apple pie from [employer] around 10 am. Pre-exercise CBG 89 mg/dL. ? If pt accidentally took Glipizide given blood sugar after eating apple pie. Pt is interested in a new PCP. Per discussion, pt wants to see the same PCP her brother sees. Pt states she called the PCP's nurse. Pt encouraged to call PCP office and schedule an appointment rather than going through the RN. Resources to find a new PCP discussed. Pt expressed understanding of the information reviewed. Continue client-centered nutrition education by RD as part of interdisciplinary care.  Monitor and evaluate progress toward nutrition goal with team.  Mickle PlumbEdna Cailynn Bodnar, M.Ed, RD, LDN, CDE 07/24/2016 12:28 PM

## 2016-07-25 ENCOUNTER — Telehealth (HOSPITAL_COMMUNITY): Payer: Self-pay | Admitting: *Deleted

## 2016-07-25 NOTE — Telephone Encounter (Signed)
-----   Message from Daniel R Bensimhon, MD sent at 07/25/2016  1:09 PM EDT ----- Regarding: RE: Sinus brady in cardiac rehab We will stop carvedilol   ----- Message ----- From: Carlette B Carlton, RN Sent: 07/24/2016  12:24 PM To: Daniel R Bensimhon, MD, Amy D Clegg, NP Subject: Sinus brady in cardiac rehab                   Pt started CR this week. Pt resting HR is mid 40's. HR low 60's with exercise.  Pt asymptomatic but does complain of on going fatigue.  Pt takes correg 3.125 mg twice a day.  What is an acceptable HR range for this pt?  Thanks for the input  

## 2016-07-25 NOTE — Telephone Encounter (Signed)
-----   Message from Sherald HessAmy D Clegg, NP sent at 07/24/2016  4:51 PM EDT ----- Regarding: RE: Sinus brady in cardiac rehab We will set up follow up. If she is in the 40s we need an EKG.  50s-60s ok.  Thanks Amy ----- Message ----- From: Chelsea Ausarlette B Bela Bonaparte, RN Sent: 07/24/2016  12:24 PM To: Dolores Pattyaniel R Bensimhon, MD, Amy Georgie Chard Clegg, NP Subject: Sinus brady in cardiac rehab                   Pt started CR this week. Pt resting HR is mid 40's. HR low 60's with exercise.  Pt asymptomatic but does complain of on going fatigue.  Pt takes correg 3.125 mg twice a day.  What is an acceptable HR range for this pt?  Thanks for the input

## 2016-07-25 NOTE — Progress Notes (Signed)
Cardiac Individual Treatment Plan  Patient Details  Name: Ta Fair MRN: [id number] Date of Birth: 29-Jan-1951 Referring Provider:   Flowsheet Row CARDIAC REHAB PHASE II ORIENTATION from 07/11/2016 in [place]  Referring Provider  Bensimhon, Daniel MD      Initial Encounter Date:  [place] from 07/11/2016 in [place]  Date  07/11/16  Referring Provider  Glori Bickers MD      Visit Diagnosis: Heart failure, chronic systolic ([place])  Patient's Home Medications on Admission:  Current Outpatient Prescriptions:  .  acetaminophen (TYLENOL) 500 MG tablet, Take 1,000 mg by mouth every 12 (twelve) hours as needed for mild pain or moderate pain., Disp: , Rfl:  .  aspirin 81 MG tablet, Take 1 tablet (81 mg total) by mouth daily., Disp: 30 tablet, Rfl: 9 .  atorvastatin (LIPITOR) 20 MG tablet, Take 20 mg by mouth daily., Disp: , Rfl:  .  Blood Glucose Monitoring Suppl (ONETOUCH VERIO) W/DEVICE KIT, 1 Act by Does not apply route 3 (three) times daily., Disp: 2 kit, Rfl: 0 .  carvedilol (COREG) 3.125 MG tablet, Take 1 tablet (3.125 mg total) by mouth 2 (two) times daily., Disp: 60 tablet, Rfl: 5 .  glipiZIDE (GLUCOTROL) 10 MG tablet, Take 1 tablet (10 mg total) by mouth 2 (two) times daily before a meal. Yearly physical due in Sept. Must see MD for refills, Disp: 180 tablet, Rfl: 0 .  glucose blood test strip, Use TID, Disp: 100 each, Rfl: 12 .  metolazone (ZAROXOLYN) 2.5 MG tablet, Take 1 tablet AS NEEDED with an additional 40 meq of K for weight gains of 3 lbs overnight or 5 lbs within one week., Disp: 15 tablet, Rfl: 3 .  Multiple Vitamins-Minerals (CENTRUM SILVER 50+WOMEN PO), Take 1 tablet by mouth every morning., Disp: , Rfl:  .  potassium chloride SA (K-DUR,KLOR-CON) 20 MEQ tablet, Take 2 tablets (40 mEq total) by mouth 2 (two) times daily. Take an additional 40 meq of potassium  on days you take metolazone., Disp: 140 tablet, Rfl: 6 .  sacubitril-valsartan (ENTRESTO) 24-26 MG, Take 1 tablet by mouth 2 (two) times daily., Disp: 60 tablet, Rfl: 6 .  spironolactone (ALDACTONE) 25 MG tablet, Take 1 tablet (25 mg total) by mouth daily., Disp: 30 tablet, Rfl: 6 .  torsemide (DEMADEX) 20 MG tablet, Take 4 tablets (80 mg total) by mouth 2 (two) times daily., Disp: 240 tablet, Rfl: 6  Past Medical History: Past Medical History:  Diagnosis Date  . CAD (coronary artery disease) 08/2011   NSTEMI with subsequent CABG 08/13/2011 with LIMA-LAD, SVG-diagonal, SVG-OM, SVG-PDA  . Carpal tunnel syndrome on both sides    "post OHS in 08/2011; resolved now" (03/17/2013)  . CHF (congestive heart failure) ([place])   . Chronic systolic heart failure (H[facility])    Chronic systolic CHF  . Diabetes mellitus type 2 in obese ([place]) 08/10/11  . Ejection fraction < 50%    EF 20%, October, 2012  //  EF 25% December, 2012  //   EF 35%, echo, September, 2013  . Hyperlipidemia 08/10/11  . Hypertension   . Ischemic cardiomyopathy 08/13/2011   EF 20% in 08/2011, still 25% 10/21/2011  . Mitral regurgitation    Mild by echo 10/21/11  . Myocardial infarction ([place]) 08/2011  . Obesity, morbid ([place])   . Orthopnoea    "progressively worse over last 3 wks" (03/17/2013)  . Pleural effusion  Requiring L thoracentesis 09/02/11  . Pulmonary hypertension ([place])    Echo, September, 2013, 72 mmHg.    Tobacco Use: History  Smoking Status  . Former Smoker  . Types: Cigarettes  . Quit date: 11/11/1978  Smokeless Tobacco  . Never Used    Comment: 03/17/2013 "only a social smoker when I did smoke; ever bought any"    Labs: Recent Review Flowsheet Data    Labs for ITP Cardiac and Pulmonary Rehab Latest Ref Rng & Units 04/22/2016 04/23/2016 04/24/2016 04/25/2016 04/26/2016   Cholestrol 0 - 200 mg/dL - - - - -   LDLCALC 0 - 99 mg/dL - - - - -   HDL >40 mg/dL - - - - -   Trlycerides <150 mg/dL - - - - -   Hemoglobin A1c 4.8  - 5.6 % - - - - -   PHART 7.350 - 7.450 - - - - -   PCO2ART 35.0 - 45.0 mmHg - - - - -   HCO3 20.0 - 24.0 mEq/L - - - - -   TCO2 0 - 100 mmol/L - - - - -   ACIDBASEDEF 0.0 - 2.0 mmol/L - - - - -   O2SAT % 80.1 73.3 63.8 60.8 68.8      Capillary Blood Glucose: Lab Results  Component Value Date   GLUCAP 82 07/24/2016   GLUCAP 83 07/24/2016   GLUCAP 133 (H) 07/22/2016   GLUCAP 158 (H) 07/22/2016   GLUCAP 95 07/19/2016     Exercise Target Goals:    Exercise Program Goal: Individual exercise prescription set with THRR, safety & activity barriers. Participant demonstrates ability to understand and report RPE using BORG scale, to self-measure pulse accurately, and to acknowledge the importance of the exercise prescription.  Exercise Prescription Goal: Starting with aerobic activity 30 plus minutes a day, 3 days per week for initial exercise prescription. Provide home exercise prescription and guidelines that participant acknowledges understanding prior to discharge.  Activity Barriers & Risk Stratification:     Activity Barriers & Cardiac Risk Stratification - 07/11/16 0817      Activity Barriers & Cardiac Risk Stratification   Activity Barriers None   Cardiac Risk Stratification High      6 Minute Walk:     6 Minute Walk    Row Name 07/11/16 1220 07/11/16 1233       6 Minute Walk   Phase Initial  -    Distance 860 feet  -    Walk Time 6 minutes  -    # of Rest Breaks 0  -    MPH 1.6  -    METS 1.49  -    RPE 13  -    Perceived Dyspnea   - 1    VO2 Peak 5.2  -    Symptoms Yes (comment)  -    Comments fatigue  -    Resting HR 65 bpm  -    Resting BP 108/72  -    Max Ex. HR 83 bpm  -    Max Ex. BP 110/70  -    2 Minute Post BP 98/60  -       Initial Exercise Prescription:     Initial Exercise Prescription - 07/11/16 1200      Date of Initial Exercise RX and Referring Provider   Date 07/11/16   Referring Provider Glori Bickers MD     Recumbant  Bike   Level 1  Watts 10   Minutes 10   METs 2.29     NuStep   Level 2   Minutes 10   METs 2     Track   Laps 7   Minutes 10   METs 2.2     Prescription Details   Frequency (times per week) 3   Duration Progress to 30 minutes of continuous aerobic without signs/symptoms of physical distress     Intensity   THRR 40-80% of Max Heartrate 62-125   Ratings of Perceived Exertion 11-13   Perceived Dyspnea 0-4     Progression   Progression Continue to progress workloads to maintain intensity without signs/symptoms of physical distress.     Resistance Training   Training Prescription Yes   Weight 1   Reps 10-12      Perform Capillary Blood Glucose checks as needed.  Exercise Prescription Changes:      Exercise Prescription Changes    Row Name 07/26/16 0700             Exercise Review   Progression Yes         Response to Exercise   Blood Pressure (Admit) 104/70       Blood Pressure (Exercise) 120/70       Blood Pressure (Exit) 100/60       Heart Rate (Admit) 48 bpm       Heart Rate (Exercise) 70 bpm       Heart Rate (Exit) 48 bpm       Rating of Perceived Exertion (Exercise) 13       Duration Progress to 30 minutes of continuous aerobic without signs/symptoms of physical distress       Intensity THRR unchanged         Progression   Progression Continue to progress workloads to maintain intensity without signs/symptoms of physical distress.       Average METs 1.9         Resistance Training   Training Prescription Yes       Weight 1       Reps 10-12         Interval Training   Interval Training No         Recumbant Bike   Level 1       Watts 8       Minutes 10       METs 2.22         NuStep   Level 2       Minutes 10       METs 1.5         Track   Laps 5       Minutes 10       METs 1.84          Exercise Comments:      Exercise Comments    Row Name 07/26/16 0725           Exercise Comments Off to a slow start with exercise  due to low pre-exercise blood sugar.          Discharge Exercise Prescription (Final Exercise Prescription Changes):     Exercise Prescription Changes - 07/26/16 0700      Exercise Review   Progression Yes     Response to Exercise   Blood Pressure (Admit) 104/70   Blood Pressure (Exercise) 120/70   Blood Pressure (Exit) 100/60   Heart Rate (Admit) 48 bpm   Heart Rate (Exercise) 70 bpm   Heart  Rate (Exit) 48 bpm   Rating of Perceived Exertion (Exercise) 13   Duration Progress to 30 minutes of continuous aerobic without signs/symptoms of physical distress   Intensity THRR unchanged     Progression   Progression Continue to progress workloads to maintain intensity without signs/symptoms of physical distress.   Average METs 1.9     Resistance Training   Training Prescription Yes   Weight 1   Reps 10-12     Interval Training   Interval Training No     Recumbant Bike   Level 1   Watts 8   Minutes 10   METs 2.22     NuStep   Level 2   Minutes 10   METs 1.5     Track   Laps 5   Minutes 10   METs 1.84      Nutrition:  Target Goals: Understanding of nutrition guidelines, daily intake of sodium '1500mg'$ , cholesterol '200mg'$ , calories 30% from fat and 7% or less from saturated fats, daily to have 5 or more servings of fruits and vegetables.  Biometrics:     Pre Biometrics - 07/11/16 1234      Pre Biometrics   Waist Circumference 47 inches   Hip Circumference 48.5 inches   Waist to Hip Ratio 0.97 %   Triceps Skinfold 37 mm   % Body Fat 50.4 %   Grip Strength 19 kg   Flexibility 8 in   Single Leg Stand 2.65 seconds       Nutrition Therapy Plan and Nutrition Goals:     Nutrition Therapy & Goals - 07/17/16 1233      Nutrition Therapy   Diet Carb Modified, Therapeutic Lifestyle Change     Personal Nutrition Goals   Personal Goal #1 Improved glycemic control as evidenced by A1c trending toward less than 7%     Intervention Plan   Intervention  Prescribe, educate and counsel regarding individualized specific dietary modifications aiming towards targeted core components such as weight, hypertension, lipid management, diabetes, heart failure and other comorbidities.;Nutrition handout(s) given to patient.  Pre-exercise Snack Guidelines for DM; List of Endocrinologists in the area   Expected Outcomes Short Term Goal: Understand basic principles of dietary content, such as calories, fat, sodium, cholesterol and nutrients.;Long Term Goal: Adherence to prescribed nutrition plan.      Nutrition Discharge: Nutrition Scores:   Nutrition Goals Re-Evaluation:   Psychosocial: Target Goals: Acknowledge presence or absence of depression, maximize coping skills, provide positive support system. Participant is able to verbalize types and ability to use techniques and skills needed for reducing stress and depression.  Initial Review & Psychosocial Screening:     Initial Psych Review & Screening - 07/25/16 Pace? Yes      Quality of Life Scores:   PHQ-9: Recent Review Flowsheet Data    Depression screen [facility] 2/9 01/04/2016 02/17/2012   Decreased Interest 0 0   Down, Depressed, Hopeless 0 0   PHQ - 2 Score 0 0      Psychosocial Evaluation and Intervention:     Psychosocial Evaluation - 07/25/16 1926      Psychosocial Evaluation & Interventions   Interventions Encouraged to exercise with the program and follow exercise prescription;Relaxation education      Psychosocial Re-Evaluation:     Psychosocial Re-Evaluation    [place] Name 07/25/16 1926             Psychosocial Re-Evaluation  Interventions Encouraged to attend Cardiac Rehabilitation for the exercise;Relaxation education;Stress management education       Comments Pt is dealing with a chronic disease process.  Pt receives some support from her familhy.            Vocational Rehabilitation: Provide vocational rehab  assistance to qualifying candidates.   Vocational Rehab Evaluation & Intervention:   Education: Education Goals: Education classes will be provided on a weekly basis, covering required topics. Participant will state understanding/return demonstration of topics presented.  Learning Barriers/Preferences:     Learning Barriers/Preferences - 07/11/16 0817      Learning Barriers/Preferences   Learning Barriers Sight   Learning Preferences Skilled Demonstration      Education Topics: Count Your Pulse:  -Group instruction provided by verbal instruction, demonstration, patient participation and written materials to support subject.  Instructors address importance of being able to find your pulse and how to count your pulse when at home without a heart monitor.  Patients get hands on experience counting their pulse with staff help and individually.   Heart Attack, Angina, and Risk Factor Modification:  -Group instruction provided by verbal instruction, video, and written materials to support subject.  Instructors address signs and symptoms of angina and heart attacks.    Also discuss risk factors for heart disease and how to make changes to improve heart health risk factors.   Functional Fitness:  -Group instruction provided by verbal instruction, demonstration, patient participation, and written materials to support subject.  Instructors address safety measures for doing things around the house.  Discuss how to get up and down off the floor, how to pick things up properly, how to safely get out of a chair without assistance, and balance training.   Meditation and Mindfulness:  -Group instruction provided by verbal instruction, patient participation, and written materials to support subject.  Instructor addresses importance of mindfulness and meditation practice to help reduce stress and improve awareness.  Instructor also leads participants through a meditation exercise.    Stretching for  Flexibility and Mobility:  -Group instruction provided by verbal instruction, patient participation, and written materials to support subject.  Instructors lead participants through series of stretches that are designed to increase flexibility thus improving mobility.  These stretches are additional exercise for major muscle groups that are typically performed during regular warm up and cool down.   Hands Only CPR Anytime:  -Group instruction provided by verbal instruction, video, patient participation and written materials to support subject.  Instructors co-teach with AHA video for hands only CPR.  Participants get hands on experience with mannequins.   Nutrition I class: Heart Healthy Eating:  -Group instruction provided by PowerPoint slides, verbal discussion, and written materials to support subject matter. The instructor gives an explanation and review of the Therapeutic Lifestyle Changes diet recommendations, which includes a discussion on lipid goals, dietary fat, sodium, fiber, plant stanol/sterol esters, sugar, and the components of a well-balanced, healthy diet.   Nutrition II class: Lifestyle Skills:  -Group instruction provided by PowerPoint slides, verbal discussion, and written materials to support subject matter. The instructor gives an explanation and review of label reading, grocery shopping for heart health, heart healthy recipe modifications, and ways to make healthier choices when eating out.   Diabetes Question & Answer:  -Group instruction provided by PowerPoint slides, verbal discussion, and written materials to support subject matter. The instructor gives an explanation and review of diabetes co-morbidities, pre- and post-prandial blood glucose goals, pre-exercise blood glucose goals, signs,  symptoms, and treatment of hypoglycemia and hyperglycemia, and foot care basics. Flowsheet Row CARDIAC REHAB PHASE II EXERCISE from 07/24/2016 in [place]  Date  07/19/16  Educator  RD  Instruction Review Code  2- meets goals/outcomes      Diabetes Blitz:  -Group instruction provided by PowerPoint slides, verbal discussion, and written materials to support subject matter. The instructor gives an explanation and review of the physiology behind type 1 and type 2 diabetes, diabetes medications and rational behind using different medications, pre- and post-prandial blood glucose recommendations and Hemoglobin A1c goals, diabetes diet, and exercise including blood glucose guidelines for exercising safely.    Portion Distortion:  -Group instruction provided by PowerPoint slides, verbal discussion, written materials, and food models to support subject matter. The instructor gives an explanation of serving size versus portion size, changes in portions sizes over the last 20 years, and what consists of a serving from each food group.   Stress Management:  -Group instruction provided by verbal instruction, video, and written materials to support subject matter.  Instructors review role of stress in heart disease and how to cope with stress positively.     Exercising on Your Own:  -Group instruction provided by verbal instruction, power point, and written materials to support subject.  Instructors discuss benefits of exercise, components of exercise, frequency and intensity of exercise, and end points for exercise.  Also discuss use of nitroglycerin and activating EMS.  Review options of places to exercise outside of rehab.  Review guidelines for sex with heart disease.   Cardiac Drugs I:  -Group instruction provided by verbal instruction and written materials to support subject.  Instructor reviews cardiac drug classes: antiplatelets, anticoagulants, beta blockers, and statins.  Instructor discusses reasons, side effects, and lifestyle considerations for each drug class.   Cardiac Drugs II:  -Group instruction provided by verbal instruction and  written materials to support subject.  Instructor reviews cardiac drug classes: angiotensin converting enzyme inhibitors (ACE-I), angiotensin II receptor blockers (ARBs), nitrates, and calcium channel blockers.  Instructor discusses reasons, side effects, and lifestyle considerations for each drug class. Flowsheet Row CARDIAC REHAB PHASE II EXERCISE from 07/24/2016 in [place]  Date  07/24/16  Educator  pharmacy  Instruction Review Code  2- meets goals/outcomes      Anatomy and Physiology of the Circulatory System:  -Group instruction provided by verbal instruction, video, and written materials to support subject.  Reviews functional anatomy of heart, how it relates to various diagnoses, and what role the heart plays in the overall system. 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 and lose 8lbs; Long: lose 20lbs, feel better   Intervention provide exercise programming and nutrition guidelines to aid in overall cardiovascular fitness and weight loss   Expected Outcomes patient will be able to fish and  lose 8-10lbs      Core Components/Risk Factors/Patient Goals Review:      Goals and Risk Factor Review    Row Name 07/26/16 0726             Core Components/Risk Factors/Patient Goals Review   Personal Goals Review Other       Review Patient off to a slow start with exercise due to low pre-exercise blood  sugar readings.        Expected Outcomes Begin regular exercise routine to improve cardiorespiratory fitness and help achieve weight loss goals.          Core Components/Risk Factors/Patient Goals at Discharge (Final Review):      Goals and Risk Factor Review - 07/26/16 0726      Core Components/Risk Factors/Patient Goals Review   Personal Goals Review Other   Review Patient off to a slow start with exercise due to low pre-exercise blood sugar readings.    Expected Outcomes Begin regular exercise routine to improve cardiorespiratory fitness and help achieve weight loss goals.      ITP Comments:     ITP Comments    Row Name 07/11/16 0815           ITP Comments Dr. Sheppard Evens Director          Comments:  Pt is making expected progress toward personal goals after completing 5 sessions. Pt had 3 occasions her blood glucose was not high enough to exercise.  Pt received counseling from diabetes educator and dietician. Recommend continued exercise and life style modification education including  stress management and relaxation techniques to decrease cardiac risk profile. Pt has some issues with dealing with a chronic illness.  Pt has some support from her family.  Pt often feels bad and desires to feel better.  Continue to support and encourage pt. Pt has an appt on tomorrow with new PCP. Pt is hopeful to establish good rapport and improved management of her diabetes. Cherre Huger, BSN

## 2016-07-26 ENCOUNTER — Encounter (HOSPITAL_COMMUNITY): Admission: RE | Admit: 2016-07-26 | Payer: Medicare Other | Source: Ambulatory Visit

## 2016-07-28 ENCOUNTER — Other Ambulatory Visit: Payer: Self-pay | Admitting: Internal Medicine

## 2016-07-29 ENCOUNTER — Telehealth (HOSPITAL_COMMUNITY): Payer: Self-pay | Admitting: *Deleted

## 2016-07-29 ENCOUNTER — Other Ambulatory Visit: Payer: Self-pay | Admitting: Internal Medicine

## 2016-07-29 ENCOUNTER — Encounter (HOSPITAL_COMMUNITY)
Admission: RE | Admit: 2016-07-29 | Discharge: 2016-07-29 | Disposition: A | Discharge status: Home / Self Care | Payer: Medicare Other | Source: Ambulatory Visit | Attending: Internal Medicine | Admitting: Internal Medicine

## 2016-07-29 DIAGNOSIS — I5020 Unspecified systolic (congestive) heart failure: Secondary | ICD-10-CM | POA: Diagnosis not present

## 2016-07-29 LAB — GLUCOSE, CAPILLARY
GLUCOSE-CAPILLARY: 181 mg/dL — AB (ref 65–99)
GLUCOSE-CAPILLARY: 196 mg/dL — AB (ref 65–99)

## 2016-07-29 NOTE — Telephone Encounter (Signed)
Attempted to call pt w/instructions regarding Carvedilol.  Left her a mess to call us back.  Pt is sch to see Dr Gala RomneyBensimhon and have an echocardiogram on 9/20

## 2016-07-29 NOTE — Telephone Encounter (Signed)
-----   Message from Dolores Pattyaniel R Bensimhon, MD sent at 07/25/2016  1:09 PM EDT ----- Regarding: RE: Sinus brady in cardiac rehab We will stop carvedilol   ----- Message ----- From: Chelsea Ausarlette B Carlton, RN Sent: 07/24/2016  12:24 PM To: Dolores Pattyaniel R Bensimhon, MD, Amy Georgie Chard Clegg, NP Subject: Sinus brady in cardiac rehab                   Pt started CR this week. Pt resting HR is mid 40's. HR low 60's with exercise.  Pt asymptomatic but does complain of on going fatigue.  Pt takes correg 3.125 mg twice a day.  What is an acceptable HR range for this pt?  Thanks for the input

## 2016-07-30 MED FILL — GABAPENTIN 300 MG CAPSULE: 300 | 30 days supply | Qty: 30 | Fill #0

## 2016-07-31 ENCOUNTER — Encounter (HOSPITAL_COMMUNITY): Payer: Medicare Other

## 2016-07-31 ENCOUNTER — Ambulatory Visit (HOSPITAL_COMMUNITY)
Admission: RE | Admit: 2016-07-31 | Discharge: 2016-07-31 | Disposition: A | Discharge status: Home / Self Care | Payer: Medicare Other | Source: Ambulatory Visit | Attending: Internal Medicine | Admitting: Internal Medicine

## 2016-07-31 ENCOUNTER — Ambulatory Visit (HOSPITAL_COMMUNITY): Admission: RE | Admit: 2016-07-31 | Payer: Medicare Other | Source: Ambulatory Visit

## 2016-07-31 VITALS — BP 98/66 | HR 73 | Wt 236.5 lb

## 2016-07-31 DIAGNOSIS — E1122 Type 2 diabetes mellitus with diabetic chronic kidney disease: Secondary | ICD-10-CM | POA: Diagnosis not present

## 2016-07-31 DIAGNOSIS — I252 Old myocardial infarction: Secondary | ICD-10-CM | POA: Diagnosis not present

## 2016-07-31 DIAGNOSIS — R42 Dizziness and giddiness: Secondary | ICD-10-CM | POA: Insufficient documentation

## 2016-07-31 DIAGNOSIS — I25810 Atherosclerosis of coronary artery bypass graft(s) without angina pectoris: Secondary | ICD-10-CM | POA: Diagnosis not present

## 2016-07-31 DIAGNOSIS — N183 Chronic kidney disease, stage 3 unspecified: Secondary | ICD-10-CM

## 2016-07-31 DIAGNOSIS — Z79899 Other long term (current) drug therapy: Secondary | ICD-10-CM | POA: Insufficient documentation

## 2016-07-31 DIAGNOSIS — Z7982 Long term (current) use of aspirin: Secondary | ICD-10-CM | POA: Diagnosis not present

## 2016-07-31 DIAGNOSIS — Z951 Presence of aortocoronary bypass graft: Secondary | ICD-10-CM | POA: Insufficient documentation

## 2016-07-31 DIAGNOSIS — I130 Hypertensive heart and chronic kidney disease with heart failure and stage 1 through stage 4 chronic kidney disease, or unspecified chronic kidney disease: Secondary | ICD-10-CM | POA: Insufficient documentation

## 2016-07-31 DIAGNOSIS — I340 Nonrheumatic mitral (valve) insufficiency: Secondary | ICD-10-CM | POA: Diagnosis not present

## 2016-07-31 DIAGNOSIS — E785 Hyperlipidemia, unspecified: Secondary | ICD-10-CM | POA: Diagnosis not present

## 2016-07-31 DIAGNOSIS — I5022 Chronic systolic (congestive) heart failure: Secondary | ICD-10-CM | POA: Diagnosis present

## 2016-07-31 DIAGNOSIS — I272 Other secondary pulmonary hypertension: Secondary | ICD-10-CM | POA: Diagnosis not present

## 2016-07-31 DIAGNOSIS — I2510 Atherosclerotic heart disease of native coronary artery without angina pectoris: Secondary | ICD-10-CM | POA: Diagnosis not present

## 2016-07-31 LAB — BASIC METABOLIC PANEL
Anion gap: 9 (ref 5–15)
BUN: 81 mg/dL — ABNORMAL HIGH (ref 6–20)
CALCIUM: 9.8 mg/dL (ref 8.9–10.3)
CO2: 24 mmol/L (ref 22–32)
CREATININE: 2.27 mg/dL — AB (ref 0.44–1.00)
Chloride: 104 mmol/L (ref 101–111)
GFR, EST AFRICAN AMERICAN: 25 mL/min — AB (ref >60)
GFR, EST NON AFRICAN AMERICAN: 21 mL/min — AB (ref >60)
Glucose, Bld: 155 mg/dL — ABNORMAL HIGH (ref 65–99)
Potassium: 4.5 mmol/L (ref 3.5–5.1)
SODIUM: 137 mmol/L (ref 135–145)

## 2016-07-31 NOTE — Progress Notes (Signed)
Patient ID: Christine Knight, female   DOB: Jun 30, 1951, 65 y.o.   MRN: [id number]     Advanced Heart Failure Clinic Note   PCP: Roque Cash  HF: Dr. Haroldine Laws   HPI: Christine Knight is a 65 y.o. female w/ PMHx significant for morbid obesity, systolic HF due to ICM (81/85 EF 25%; 07/2012 EF 35%; 03/18/13 EF 20-25%), CAD with h/o NSTEMI s/p CABG Oct 2012, pulmonary hypertension (PA peak 14mHg), and poorly controlled DM 2.  [facility], and weight gain.  Lasix gtt used and she diuresed 15 liters. Diuresed 29 pounds.  ECHO- EF 20-25%.  Discharge weight 264 pounds. Christine Harmanstopped d/t dizziness and hyperkalemia   Admitted 12/08/2015 - 12/28/2015 with worsening DOE and cough. Stated she had a 40 lb weight since 07/2015. She was diuresed aggressively with lasix gtt at 30 mg/hr and metolazone 5 mg BID. Diamox eventually added. Diuretics held temporarily towards end of stay with worsening Creatinine. Overall she diuresed >40 L and was down 70 lbs from admission weight. Pt had R/LHC with preserved CO and mild/mod Pulm HTN. Severe native CAD with patent CABG grafts. Discharge weight 237 lbs.   Admitted 6/1 -04/26/2016 with marked volume overload, low output hf, cardiorenal syndrome. Diuresed with high dose lasix 160 mg three times a day. Diuresed over 50 pounds. Transitioned to torsemide 80 mg daily. BB and losartan stopped. Discharge weight 222 pounds   She presents for HF follow up. Last visit entresto was started. Also stopped carvedilol stopped due to bradycardia noted in cardiac rehab. Overall says she feels great. Weight at home ~242 pounds. Denies SOB/PND/Orthopnea. Attending cardiac rehab 3 days a week. Taking all medications. Lives with sister and daughter.   Labs 2/17 K 4.3, Cr 1.56 Labs 04/26/2016: K 3.1 Creatinine 1.78 K 3.1 Labs 06/05/2016: K 4.3 Creatinine 1.25   03/18/13 ECHO EF 20-25% 06/17/2013 ECHO EF 35% RV ok (read formally after visit 35-40%) 03/15/2015 ECHO EF  30-35%, grade 3 DD, Moderate TR, Severely reduced RV, PA peak pressure 81 mm Hg 12/09/2015 ECHO EF 15-20%, Decreased LV diastolic compliance, RV mod reduced, PA peak 57 mmHg, Severe TR  04/14/16 Echo 15-20%   R/LHC 12/13/15 Hemodynamics RA 25/25 (23) RV pressure 92/13  RV EDP 26 PA pressure 86/34 (56) PW mean 37 AO Pressure 114/72 (88) LV Pressure 101/12 (26) LV EDP 26 PVR 3.24 Fick CO/CI 5.85 / 2.41L/min   Prox LAD lesion, 100% stenosed. Prox Cx lesion, 99% stenosed. Mid RCA lesion, 80% stenosed. Dist RCA lesion, 100% stenosed. Patent grafts  ROS: All systems negative except as listed in HPI, PMH and Problem List.  Past Medical History:  Diagnosis Date  . CAD (coronary artery disease) 08/2011   NSTEMI with subsequent CABG 08/13/2011 with LIMA-LAD, SVG-diagonal, SVG-OM, SVG-PDA  . Carpal tunnel syndrome on both sides    "post OHS in 08/2011; resolved now" (03/17/2013)  . [place]   . Chronic systolic heart failure (HCC)    Chronic systolic CHF  . Diabetes mellitus type 2 in obese ([place] 08/10/11  . Ejection fraction < 50%    EF 20%, October, 2012  //  EF 25% December, 2012  //   EF 35%, echo, September, 2013  . Hyperlipidemia 08/10/11  . Hypertension   . Ischemic cardiomyopathy 08/13/2011   EF 20% in 08/2011, still 25% 10/21/2011  . Mitral regurgitation    Mild by echo 10/21/11  . [place]ocardial infarction [place] 08/[place]  .  Obesity, morbid ([place])   . Orthopnoea    "progressively worse over last 3 wks" (03/17/2013)  . Pleural effusion    Requiring L thoracentesis 09/02/11  . Pulmonary hypertension ([place])    Echo, September, 2013, 72 mmHg.    Current Outpatient Prescriptions  Medication Sig Dispense Refill  . acetaminophen (TYLENOL) 500 MG tablet Take 1,000 mg by mouth every 12 (twelve) hours as needed for mild pain or moderate pain.    Marland Kitchen aspirin 81 MG tablet Take 1 tablet (81 mg total) by mouth daily. 30 tablet 9  . atorvastatin (LIPITOR) 20 MG tablet  Take 20 mg by mouth daily.    . Blood Glucose Monitoring Suppl (ONETOUCH VERIO) W/DEVICE KIT 1 Act by Does not apply route 3 (three) times daily. 2 kit 0  . gabapentin (NEURONTIN) 300 MG capsule Take 300 mg by mouth at bedtime.    Marland Kitchen glucose blood test strip Use TID 100 each 12  . metolazone (ZAROXOLYN) 2.5 MG tablet Take 1 tablet AS NEEDED with an additional 40 meq of K for weight gains of 3 lbs overnight or 5 lbs within one week. 15 tablet 3  . Multiple Vitamins-Minerals (CENTRUM SILVER 50+WOMEN PO) Take 1 tablet by mouth every morning.    . potassium chloride SA (K-DUR,KLOR-CON) 20 MEQ tablet Take 2 tablets (40 mEq total) by mouth 2 (two) times daily. Take an additional 40 meq of potassium on days you take metolazone. 140 tablet 6  . sacubitril-valsartan (ENTRESTO) 24-26 MG Take 1 tablet by mouth 2 (two) times daily. 60 tablet 6  . spironolactone (ALDACTONE) 25 MG tablet Take 1 tablet (25 mg total) by mouth daily. 30 tablet 6  . torsemide (DEMADEX) 20 MG tablet Take 4 tablets (80 mg total) by mouth 2 (two) times daily. 240 tablet 6   No current facility-administered medications for this encounter.      PHYSICAL EXAM: Vitals:   07/31/16 1350  BP: 98/66  Pulse: 73  SpO2: 95%  Weight: 236 lb 8 oz (107.3 kg)   Wt Readings from Last 3 Encounters:  07/31/16 236 lb 8 oz (107.3 kg)  07/11/16 239 lb 10.2 oz (108.7 kg)  06/13/16 232 lb 2 oz (105.3 kg)     General:NAD. Ambulated in the clinic without difficulty.  HEENT: normal Neck: JVP ~10 Carotids 2+ bilat; no bruits. No thyromegaly or lymphadenopathy noted. Cor: PMI nondisplaced. Distant, RRR. No M/G/R Lungs: Clear Abdomen: Morbidly obese, soft, non-tender, mild/mod distended, no HSM. No bruits or masses. +BS  Extremities: no cyanosis, clubbing, rash. R and LLE 1+ edema. . Neuro: alert & orientedx3, cranial nerves grossly intact. moves all 4 extremities w/o difficulty. Affect pleasant  ASSESSMENT/PLAN 1. Chronic systolic HF due to  iCM EF 15-20% Echo 04/14/2016.Recent admit for low out put HF and diuresed over 50 pounds.Hospital course complicated by cardiorenal failure.  Had ECHO yesterday at the [facility]- EF 30-35% RV normal.   NYHA II  Volume status. Continue 80 mg torsemide BID.  Continue K 40 meq twice a day.   -Continue entresto 24-26 mg twice a day. BMEt today.  - Continue 25 mg spiro daily - Had low heart 40s. Off coreg.   - Intolerant bidil but difficulty with dizziness and hypotension.  --2. CAD s/p NSTEMI and CABG 10/12  - Severe native CAD with patent grafts on [facility] 12/13/15. No CP - Continue ASA 81 mg BID and atorvastatin 10 mg daily.  3. Morbid obesity - Encouraged to watch portions and increase  activity.   4. Pulmonary HTN - mild to mod RHC 12/13/15. PVR 3.24.  5. CKD stage III- Creatinine 1.49  6. DM2 - PCP following.  7. Day time fatigue- Offered sleep study. PCP plans to work up.    BMET today. Continue cardiac rehab.   Follow up 2 months.   Darrick Grinder, NP-C   2:36 PM

## 2016-07-31 NOTE — Patient Instructions (Signed)
Lab today  Your physician recommends that you schedule a follow-up appointment in: 2 months  

## 2016-07-31 NOTE — Progress Notes (Signed)
Medication Samples have been provided to the patient.  Drug name: Sherryll BurgerEntresto       Strength: 24/26mg         Qty: 3  LOT: [id number]  Exp.Date: 1/18  Dosing instructions: 1 tab Twice daily   The patient has been instructed regarding the correct time, dose, and frequency of taking this medication, including desired effects and most common side effects.   Christine Knight 2:47 PM 07/31/2016

## 2016-08-01 ENCOUNTER — Other Ambulatory Visit (HOSPITAL_COMMUNITY): Payer: Self-pay | Admitting: Cardiology

## 2016-08-01 ENCOUNTER — Telehealth (HOSPITAL_COMMUNITY): Payer: Self-pay | Admitting: Pharmacist

## 2016-08-01 NOTE — Telephone Encounter (Signed)
Left VM for patient to call back to discuss [employer] coverage for high copay cost of Entresto.   Tyler DeisErika K. Bonnye FavaNicolsen, PharmD, BCPS, CPP Clinical Pharmacist Pager: [phone] Phone: [phone] 08/01/2016 11:54 AM

## 2016-08-02 ENCOUNTER — Encounter (HOSPITAL_COMMUNITY)
Admission: RE | Admit: 2016-08-02 | Discharge: 2016-08-02 | Disposition: A | Discharge status: Home / Self Care | Payer: Medicare Other | Source: Ambulatory Visit | Attending: Internal Medicine | Admitting: Internal Medicine

## 2016-08-02 ENCOUNTER — Telehealth (HOSPITAL_COMMUNITY): Payer: Self-pay

## 2016-08-02 DIAGNOSIS — I5020 Unspecified systolic (congestive) heart failure: Secondary | ICD-10-CM | POA: Diagnosis not present

## 2016-08-02 DIAGNOSIS — I5022 Chronic systolic (congestive) heart failure: Secondary | ICD-10-CM

## 2016-08-02 LAB — GLUCOSE, CAPILLARY: GLUCOSE-CAPILLARY: 186 mg/dL — AB (ref 65–99)

## 2016-08-02 MED ORDER — POTASSIUM CHLORIDE CRYS ER 20 MEQ PO TBCR: 20.0000 meq | EXTENDED_RELEASE_TABLET | Freq: Every day | ORAL | 6 refills | Status: DC

## 2016-08-02 MED ORDER — TORSEMIDE 20 MG PO TABS: 40.0000 mg | ORAL_TABLET | Freq: Every day | ORAL | 6 refills | Status: DC

## 2016-08-02 NOTE — Telephone Encounter (Signed)
Basic metabolic panel  Order: [id number]  Status:  Final result Visible to patient:  Yes (MyChart) Next appt:  08/05/2016 at 11:15 AM in Cardiac Rehabilitation ([facility]) Dx:  Chronic systolic heart failure [facility](HCC)  Notes Recorded by Noralee SpaceHeather M Schub, RN on 08/01/2016 at 4:44 PM EDT Unable to reach patient.  ------  Notes Recorded by Noralee SpaceHeather M Schub, RN on 08/01/2016 at 12:44 PM EDT Left message for patient to call back.  ------  Notes Recorded by Noralee SpaceHeather M Schub, RN on 07/31/2016 at 3:31 PM EDT Per Tonye BecketAmy Clegg, NP pt needs to hold Torsemide and KCL for 2 days then restart at Torsemide 40 mg daily and KCL 20 meq daily, repeat labs in 1 week.   Spoke with patient regarding lab results and reviewed changes in medication.  Patient voiced back understanding of these changes and will come for lab redraw next Friday 08/09/16. Medications updated to preferred pharmacy electronically and lab apt scheduled.  Ave FilterBradley, Megan Genevea, RN

## 2016-08-05 ENCOUNTER — Ambulatory Visit (HOSPITAL_COMMUNITY)
Admission: RE | Admit: 2016-08-05 | Discharge: 2016-08-05 | Disposition: A | Discharge status: Home / Self Care | Payer: Medicare Other | Source: Ambulatory Visit | Attending: Internal Medicine | Admitting: Internal Medicine

## 2016-08-05 ENCOUNTER — Telehealth (HOSPITAL_COMMUNITY): Payer: Self-pay

## 2016-08-05 ENCOUNTER — Encounter (HOSPITAL_COMMUNITY)
Admission: RE | Admit: 2016-08-05 | Discharge: 2016-08-05 | Disposition: A | Discharge status: Home / Self Care | Payer: Medicare Other | Source: Ambulatory Visit | Attending: Internal Medicine | Admitting: Internal Medicine

## 2016-08-05 DIAGNOSIS — I5023 Acute on chronic systolic (congestive) heart failure: Secondary | ICD-10-CM | POA: Insufficient documentation

## 2016-08-05 DIAGNOSIS — I5022 Chronic systolic (congestive) heart failure: Secondary | ICD-10-CM

## 2016-08-05 LAB — BASIC METABOLIC PANEL
Anion gap: 10 (ref 5–15)
BUN: 102 mg/dL — ABNORMAL HIGH (ref 6–20)
CALCIUM: 9.2 mg/dL (ref 8.9–10.3)
CO2: 21 mmol/L — ABNORMAL LOW (ref 22–32)
CREATININE: 3.18 mg/dL — AB (ref 0.44–1.00)
Chloride: 102 mmol/L (ref 101–111)
GFR calc non Af Amer: 14 mL/min — ABNORMAL LOW (ref >60)
GFR, EST AFRICAN AMERICAN: 17 mL/min — AB (ref >60)
Glucose, Bld: 218 mg/dL — ABNORMAL HIGH (ref 65–99)
Potassium: 5 mmol/L (ref 3.5–5.1)
SODIUM: 133 mmol/L — AB (ref 135–145)

## 2016-08-05 MED FILL — SPIRONOLACTONE 25 MG TABLET: 25 | 30 days supply | Qty: 30 | Fill #3

## 2016-08-05 NOTE — Progress Notes (Signed)
Incomplete Session Note  Patient Details  Name: Christine Knight MRN: [id number] Date of Birth: 1951-10-15 Referring Provider:   Flowsheet Row CARDIAC REHAB PHASE II ORIENTATION [facility]  Referring Provider  Bensimhon, Reuel Boomaniel MD      Christine Knight did not complete her rehab session.  Robby's weight is up 3.8 kg from her last exercise session on 08/03/2015. Upon assessment lung fields with inspiratory and expiratory wheezes present. Oxygen saturation 96% on room air. Bilateral lower extremity edema present greater on the right. Christine Knight denies feeling short of breath and told me she held her diuretic for two days per the CHF. Christine Knight has  not taken her diuretic today. Christine ComesMegan Bradley RN at the CHF clinic called and notified about today's weight gain. Christine MilletMegan said she will talk with Tonye BecketAmy Clegg NP and call Ms. Meaney with additional instructions.Christine LighterMaria Whitaker, RN,BSN 08/05/2016 5:35 PM

## 2016-08-05 NOTE — Telephone Encounter (Signed)
Christine Knight called from cardiac rehab to chf clinic reporting patients weight up 8 lbs since Friday's triage call when CHF clinic provider [name] NP-C ordered to hold Torsemide over the weekend, then reduce dose starting today. Patient also has BLEE,  Ins+Exp wheezing. Denies SOB, and also reports not yet taking her diuretic this morning. Advised to go home and take diuretic and elevate feet.  Will forward to [name] NP-C to address if anything further is needed.  Ave FilterBradley, Beth Spackman Genevea, RN

## 2016-08-05 NOTE — Telephone Encounter (Signed)
-----   Message from Sherald HessAmy D Clegg, NP sent at 08/05/2016  3:52 PM EDT ----- Needs appointment later this week

## 2016-08-05 NOTE — Telephone Encounter (Signed)
Notes Recorded by Noralee SpaceHeather M Shermaine Brigham, RN on 08/05/2016 at 4:58 PM EDT Patient aware. Agreeable and verbalizes understanding. Pt states she is not feeling bad, no sob but is swollen. Offered appt tomorrow AM but pt has an appt w/pcp office, sch appt for Wed AM.  ------  Notes Recorded by Sherald HessAmy D Clegg, NP on 08/05/2016 at 3:52 PM EDT Needs appointment later this week ------  Notes Recorded by Sherald HessAmy D Clegg, NP on 08/05/2016 at 3:51 PM EDT Please call her stop entresto , spiro, potassium and torsemide. Repeat BMET on Friday.

## 2016-08-06 NOTE — Progress Notes (Signed)
Patient ID: Christine Knight, female   DOB: 05/09/1951, 65 y.o.   MRN: [id number]     Advanced Heart Failure Clinic Note   PCP: Christine Knight  HF: Dr. Haroldine Knight   HPI: Christine Knight is a 65 y.o. female w/ PMHx significant for morbid obesity, systolic HF due to ICM (01/75 EF 25%; 07/2012 EF 35%; 03/18/13 EF 20-25%), CAD with h/o NSTEMI s/p CABG Oct 2012, pulmonary hypertension (PA peak 44mHg), and poorly controlled DM 2.  [facility], and weight gain.  Lasix gtt used and she diuresed 15 liters. Diuresed 29 pounds.  ECHO- EF 20-25%.  Discharge weight 264 pounds. Christine Knight d/t dizziness and hyperkalemia   Admitted 12/08/2015 - 12/28/2015 with worsening DOE and cough. Stated she had a 40 lb weight since 07/2015. She was diuresed aggressively with lasix gtt at 30 mg/hr and metolazone 5 mg BID. Diamox eventually added. Diuretics held temporarily towards end of stay with worsening Creatinine. Overall she diuresed >40 L and was down 70 lbs from admission weight. Pt had R/LHC with preserved CO and mild/mod Pulm HTN. Severe native CAD with patent CABG grafts. Discharge weight 237 lbs.   Admitted 6/1 -04/26/2016 with marked volume overload, low output hf, cardiorenal syndrome. Diuresed with high dose lasix 160 mg three times a day. Diuresed over 50 pounds. Transitioned to torsemide 80 mg daily. BB and losartan stopped. Discharge weight 222 pounds   She presents for HF follow up. On 9/25 spiro, entresto, potassium , torsemide stopped due to elevated creatinine. Does not take NSAIDs.  Christine Knight had a stress test and says she has felt awful since. Had nausea but it has resolved today.  Overall feeling bad. Complaining of fatigue. Mild dyspnea with exertion.  Complaining mild fatigue. Denies PND. Weight at home trending from 237 to 252 pounds. Did not take any medications today.  Lives with sister and daughter.   Labs 2/17 K 4.3, Cr 1.56 Labs 04/26/2016: K 3.1 Creatinine 1.78  K 3.1 Labs 06/05/2016: K 4.3 Creatinine 1.25 Labas 06/13/2016: K 4.0 Creatinine 1.49  Labs 07/31/2016: K 4.5 Creatinine 2.27  Labs  08/05/2016: K 5.0 Creatinine 3.18   03/18/13 ECHO EF 20-25% 06/17/2013 ECHO EF 35% RV ok (read formally after visit 35-40%) 03/15/2015 ECHO EF 30-35%, grade 3 DD, Moderate TR, Severely reduced RV, PA peak pressure 81 mm Hg 12/09/2015 ECHO EF 15-20%, Decreased LV diastolic compliance, RV mod reduced, PA peak 57 mmHg, Severe TR  04/14/16 Echo 15-20%   R/LHC 12/13/15 Hemodynamics RA 25/25 (23) RV pressure 92/13  RV EDP 26 PA pressure 86/34 (56) PW mean 37 AO Pressure 114/72 (88) LV Pressure 101/12 (26) LV EDP 26 PVR 3.24 Fick CO/CI 5.85 / 2.41L/min   Prox LAD lesion, 100% stenosed. Prox Cx lesion, 99% stenosed. Mid RCA lesion, 80% stenosed. Dist RCA lesion, 100% stenosed. Patent grafts  ROS: All systems negative except as listed in HPI, PMH and Problem List.  Past Medical History:  Diagnosis Date  . CAD (coronary artery disease) 08/2011   NSTEMI with subsequent CABG 08/13/2011 with LIMA-LAD, SVG-diagonal, SVG-OM, SVG-PDA  . Carpal tunnel syndrome on both sides    "post OHS in 08/2011; resolved now" (03/17/2013)  . CHF (congestive heart failure) ([place]   . Chronic systolic heart failure (HCC)    Chronic systolic CHF  . Diabetes mellitus type 2 in obese ([place] 08/10/11  . Ejection fraction < 50%    EF 20%, October, 2012  //  EF  25% December, 2012  //   EF 35%, echo, September, 2013  . Hyperlipidemia 08/10/11  . Hypertension   . Ischemic cardiomyopathy 08/13/2011   EF 20% in 08/2011, still 25% 10/21/2011  . Mitral regurgitation    Mild by echo 10/21/11  . Myocardial infarction ([place]) 08/2011  . Obesity, morbid ([place])   . Orthopnoea    "progressively worse over last 3 wks" (03/17/2013)  . Pleural effusion    Requiring L thoracentesis 09/02/11  . Pulmonary hypertension ([place])    Echo, September, 2013, 72 mmHg.    Current Outpatient Prescriptions  Medication  Sig Dispense Refill  . acetaminophen (TYLENOL) 500 MG tablet Take 1,000 mg by mouth every 12 (twelve) hours as needed for mild pain or moderate pain.    Marland Kitchen aspirin 81 MG tablet Take 1 tablet (81 mg total) by mouth daily. 30 tablet 9  . atorvastatin (LIPITOR) 20 MG tablet Take 20 mg by mouth daily.    . Blood Glucose Monitoring Suppl (ONETOUCH VERIO) W/DEVICE KIT 1 Act by Does not apply route 3 (three) times daily. 2 kit 0  . gabapentin (NEURONTIN) 300 MG capsule Take 300 mg by mouth at bedtime.    Marland Kitchen glucose blood test strip Use TID 100 each 12  . Multiple Vitamins-Minerals (CENTRUM SILVER 50+WOMEN PO) Take 1 tablet by mouth every morning.    . metolazone (ZAROXOLYN) 2.5 MG tablet Take 1 tablet AS NEEDED with an additional 40 meq of K for weight gains of 3 lbs overnight or 5 lbs within one week. (Patient not taking: Reported on 08/07/2016) 15 tablet 3   No current facility-administered medications for this encounter.      PHYSICAL EXAM: Vitals:   08/07/16 1006  BP: (!) 105/52  BP Location: Left Arm  Patient Position: Sitting  Cuff Size: Large  Pulse: (!) 57  SpO2: 93%  Weight: 253 lb 3.2 oz (114.9 kg)   Wt Readings from Last 3 Encounters:  08/07/16 253 lb 3.2 oz (114.9 kg)  07/31/16 236 lb 8 oz (107.3 kg)  07/11/16 239 lb 10.2 oz (108.7 kg)     General:NAD. Ambulated in the clinic without difficulty.  HEENT: normal Neck: JVP hard to assess.  Carotids 2+ bilat; no bruits. No thyromegaly or lymphadenopathy noted. Cor: PMI nondisplaced. Distant, RRR. No M/G/R Lungs: Clear Abdomen: Morbidly obese, soft, non-tender, mild/mod distended, no HSM. No bruits or masses. +BS  Extremities: no cyanosis, clubbing, rash. R and LLE trace edema. . Neuro: alert & orientedx3, cranial nerves grossly intact. moves all 4 extremities w/o difficulty. Affect pleasant  ASSESSMENT/PLAN 1. Chronic systolic HF due to iCM EF 15-20% Echo 04/14/2016.Recent admit for low out put HF and diuresed over 50  pounds.Hospital course complicated by cardiorenal failure.  Had [facility]CHO [facility]- EF 30-35% RV normal.   NYHA II  Volume status ok. She is warm and does not appear overloaded. No need for admit or RHC.  Off bb due to low heart rate.  Off diuretics, entresto, and spiro due to elevated.  - Plan to start 20 mg of torsemide Friday then every Mon-Wed- Fri. No potassium for now.  - Intolerant bidil but difficulty with dizziness and hypotension.  --2. CAD s/p NSTEMI and CABG 10/12  - Severe native CAD with patent grafts on [facility] 12/13/15. No CP - Continue ASA 81 mg BID and atorvastatin 10 mg daily.  3. Morbid obesity - Encouraged to watch portions and increase activity.   4. Pulmonary HTN -  mild to mod RHC 12/13/15. PVR 3.24.  5. AKI on CKD stage III- Creatinine 1.49 >2.27>3.1 -> 2.2 6. DM2 - PCP following.  7. Day time fatigue- Offered sleep study. PCP plans to work up.     Todays creatinine 2.2 which is down from 3.18. Hopefully related to over diuresis.   Follow up 7 days for BMET then 2 weeks with Dr Christine Knight. May need RHC but will hold off for now.    Kartik Fernando Ninfa Meeker, NP-C   10:19 AM

## 2016-08-07 ENCOUNTER — Ambulatory Visit (HOSPITAL_COMMUNITY)
Admission: RE | Admit: 2016-08-07 | Discharge: 2016-08-07 | Disposition: A | Discharge status: Home / Self Care | Payer: Medicare Other | Source: Ambulatory Visit | Attending: Cardiology | Admitting: Cardiology

## 2016-08-07 ENCOUNTER — Encounter (HOSPITAL_COMMUNITY): Payer: Medicare Other

## 2016-08-07 VITALS — BP 105/52 | HR 57 | Wt 253.2 lb

## 2016-08-07 DIAGNOSIS — I252 Old myocardial infarction: Secondary | ICD-10-CM | POA: Insufficient documentation

## 2016-08-07 DIAGNOSIS — I2510 Atherosclerotic heart disease of native coronary artery without angina pectoris: Secondary | ICD-10-CM | POA: Insufficient documentation

## 2016-08-07 DIAGNOSIS — I272 Other secondary pulmonary hypertension: Secondary | ICD-10-CM | POA: Diagnosis not present

## 2016-08-07 DIAGNOSIS — R001 Bradycardia, unspecified: Secondary | ICD-10-CM | POA: Insufficient documentation

## 2016-08-07 DIAGNOSIS — E785 Hyperlipidemia, unspecified: Secondary | ICD-10-CM | POA: Diagnosis not present

## 2016-08-07 DIAGNOSIS — N183 Chronic kidney disease, stage 3 unspecified: Secondary | ICD-10-CM

## 2016-08-07 DIAGNOSIS — Z79899 Other long term (current) drug therapy: Secondary | ICD-10-CM | POA: Insufficient documentation

## 2016-08-07 DIAGNOSIS — E1122 Type 2 diabetes mellitus with diabetic chronic kidney disease: Secondary | ICD-10-CM | POA: Diagnosis not present

## 2016-08-07 DIAGNOSIS — Z951 Presence of aortocoronary bypass graft: Secondary | ICD-10-CM | POA: Insufficient documentation

## 2016-08-07 DIAGNOSIS — I25810 Atherosclerosis of coronary artery bypass graft(s) without angina pectoris: Secondary | ICD-10-CM

## 2016-08-07 DIAGNOSIS — Z7982 Long term (current) use of aspirin: Secondary | ICD-10-CM | POA: Insufficient documentation

## 2016-08-07 DIAGNOSIS — I130 Hypertensive heart and chronic kidney disease with heart failure and stage 1 through stage 4 chronic kidney disease, or unspecified chronic kidney disease: Secondary | ICD-10-CM | POA: Diagnosis not present

## 2016-08-07 DIAGNOSIS — I5022 Chronic systolic (congestive) heart failure: Secondary | ICD-10-CM | POA: Diagnosis not present

## 2016-08-07 DIAGNOSIS — I340 Nonrheumatic mitral (valve) insufficiency: Secondary | ICD-10-CM | POA: Diagnosis not present

## 2016-08-07 DIAGNOSIS — I255 Ischemic cardiomyopathy: Secondary | ICD-10-CM

## 2016-08-07 DIAGNOSIS — I5023 Acute on chronic systolic (congestive) heart failure: Secondary | ICD-10-CM

## 2016-08-07 LAB — BASIC METABOLIC PANEL
Anion gap: 9 (ref 5–15)
BUN: 103 mg/dL — ABNORMAL HIGH (ref 6–20)
CHLORIDE: 101 mmol/L (ref 101–111)
CO2: 21 mmol/L — ABNORMAL LOW (ref 22–32)
Calcium: 9.5 mg/dL (ref 8.9–10.3)
Creatinine, Ser: 2.2 mg/dL — ABNORMAL HIGH (ref 0.44–1.00)
GFR calc non Af Amer: 22 mL/min — ABNORMAL LOW (ref >60)
GFR, EST AFRICAN AMERICAN: 26 mL/min — AB (ref >60)
Glucose, Bld: 199 mg/dL — ABNORMAL HIGH (ref 65–99)
POTASSIUM: 5.1 mmol/L (ref 3.5–5.1)
SODIUM: 131 mmol/L — AB (ref 135–145)

## 2016-08-07 MED ORDER — TORSEMIDE 20 MG PO TABS: ORAL_TABLET | ORAL | 6 refills | Status: DC

## 2016-08-07 MED FILL — TORSEMIDE 20 MG TABLET: 20 | 28 days supply | Qty: 12 | Fill #0

## 2016-08-07 NOTE — Addendum Note (Signed)
Encounter addended by: Marcy SirenJacqueline S Audriella Blakeley, LCSW on: 08/07/2016  2:09 PM<BR>    Actions taken: Sign clinical note

## 2016-08-07 NOTE — Progress Notes (Signed)
Advanced Heart Failure Medication Review by a Pharmacist  Does the patient  feel that his/her medications are working for him/her?  yes  Has the patient been experiencing any side effects to the medications prescribed?  no  Does the patient measure his/her own blood pressure or blood glucose at home?  yes   Does the patient have any problems obtaining medications due to transportation or finances?   no  Understanding of regimen: good Understanding of indications: good Potential of compliance: good Patient understands to avoid NSAIDs. Patient understands to avoid decongestants.  Issues to address at subsequent visits: None   Pharmacist comments:  [name] is a pleasant 65 yo F presenting without a medication list but with good recall of her regimen. She reports good compliance with her regimen and did not have any specific medication-related questions or concerns for me at this time.   [name], PharmD, BCPS, CPP Clinical Pharmacist Pager: [phone] Phone: [phone] 08/07/2016 10:12 AM      Time with patient: 10 minutes Preparation and documentation time: 2 minutes Total time: 12 minutes

## 2016-08-07 NOTE — Progress Notes (Signed)
CSW met with patient in the clinic for follow up. Patient reports she is doing well and has all needed medications at home. Patient was in good spirits and received good news from provider about health status and began to cry tears of joy. Patient stated " I feel good". CSW provided support and will continue to be available as needed. Raquel Sarna, LCSW [phone]

## 2016-08-07 NOTE — Patient Instructions (Signed)
STOP metolazone.  START Torsemide 20 mg tablet once on Monday Wednesday and Friday mornings.  Return next week for lab work Designer, jewellery(BMET).  Follow up 2 weeks with Dr. Gala RomneyBensimhon.  Do the following things EVERYDAY: 1) Weigh yourself in the morning before breakfast. Write it down and keep it in a log. 2) Take your medicines as prescribed 3) Eat low salt foods-Limit salt (sodium) to 2000 mg per day.  4) Stay as active as you can everyday 5) Limit all fluids for the day to less than 2 liters

## 2016-08-09 ENCOUNTER — Encounter (HOSPITAL_COMMUNITY): Payer: Medicare Other

## 2016-08-09 ENCOUNTER — Other Ambulatory Visit (HOSPITAL_COMMUNITY): Payer: Medicare Other

## 2016-08-12 ENCOUNTER — Encounter (HOSPITAL_COMMUNITY)
Admission: RE | Admit: 2016-08-12 | Discharge: 2016-08-12 | Disposition: A | Discharge status: Home / Self Care | Payer: Medicare Other | Source: Ambulatory Visit | Attending: Internal Medicine | Admitting: Internal Medicine

## 2016-08-12 VITALS — BP 110/72 | HR 54 | Ht 65.5 in | Wt 255.5 lb

## 2016-08-12 DIAGNOSIS — I5022 Chronic systolic (congestive) heart failure: Secondary | ICD-10-CM

## 2016-08-12 DIAGNOSIS — Z9889 Other specified postprocedural states: Secondary | ICD-10-CM | POA: Diagnosis not present

## 2016-08-12 DIAGNOSIS — I5020 Unspecified systolic (congestive) heart failure: Secondary | ICD-10-CM | POA: Diagnosis present

## 2016-08-12 LAB — GLUCOSE, CAPILLARY: Glucose-Capillary: 157 mg/dL — ABNORMAL HIGH (ref 65–99)

## 2016-08-12 NOTE — Progress Notes (Signed)
Christine Knight 65 y.o. female Nutrition Note Spoke with pt. Nutrition Plan and Nutrition Survey goals reviewed with pt. Pt is following Step 2 of the Therapeutic Lifestyle Changes diet. Pt wants to lose wt. Pt expressed frustration with wt loss. Pt wt is up 7.1 kg. Pt's diuretic was discontinued. Pt reports she is now to take her diuretic Mon, Wed, Fri.  Pt is diabetic. Last A1c indicates blood glucose poorly controlled. Pt states her A1c was recently rechecked and "it was 6-something." Pt's Glipizide was discontinued by her PCP. This Clinical research associatewriter went over Diabetes Education test results. Pt checks CBG's once daily. Fasting CBG's reportedly 97-110 mg/dL. Pt with dx of CHF. Per discussion, pt does not use canned/convenience foods often. Pt looks at the food label for low sodium products.Pt expressed understanding of the information reviewed. Pt aware of nutrition education classes offered.  Lab Results  Component Value Date   HGBA1C 8.3 (H) 12/09/2015   Wt Readings from Last 3 Encounters:  08/07/16 253 lb 3.2 oz (114.9 kg)  07/31/16 236 lb 8 oz (107.3 kg)  07/11/16 239 lb 10.2 oz (108.7 kg)    Nutrition Diagnosis ? Food-and nutrition-related knowledge deficit related to lack of exposure to information as related to diagnosis of: ? CVD ? DM ? Obesity related to excessive energy intake as evidenced by a BMI of 39.4  Nutrition Intervention ? Pt's individual nutrition plan reviewed with pt. ? Benefits of adopting Therapeutic Lifestyle Changes discussed when Medficts reviewed. ? Pt to attend the Portion Distortion class ? Pt to attend the Diabetes Q & A class  ? Pt given handouts for: ? Nutrition I class ? Nutrition II class ? Diabetes Blitz Class  ? Continue client-centered nutrition education by RD, as part of interdisciplinary care. Goal(s) ? Pt to identify food quantities necessary to achieve weight loss of 6-24 lb (2.7-10.9 kg) at graduation from cardiac rehab.  ? CBG concentrations in the  normal range or as close to normal as is safely possible. Monitor and Evaluate progress toward nutrition goal with team. Christine Knight, M.Ed, RD, LDN, CDE 08/12/2016 12:09 PM

## 2016-08-12 NOTE — Progress Notes (Signed)
Reviewed home exercise guidelines with patient including endpoints, temperature precautions, target heart rate and rate of perceived exertion. Pt plans to walk and go to [employer] as her mode of home exercise. Pt voices understanding of instructions given. Artist Paislinty M Dae Highley, MS, ACSM CCEP

## 2016-08-13 ENCOUNTER — Other Ambulatory Visit: Payer: Self-pay | Admitting: Cardiology

## 2016-08-13 DIAGNOSIS — Z1231 Encounter for screening mammogram for malignant neoplasm of breast: Secondary | ICD-10-CM

## 2016-08-14 ENCOUNTER — Encounter (HOSPITAL_COMMUNITY): Payer: Medicare Other

## 2016-08-15 MED FILL — traMADol HCL 50 MG TABS: 50 | 10 days supply | Qty: 30 | Fill #0

## 2016-08-16 ENCOUNTER — Encounter (HOSPITAL_COMMUNITY): Payer: Medicare Other

## 2016-08-16 ENCOUNTER — Ambulatory Visit (HOSPITAL_COMMUNITY)
Admission: RE | Admit: 2016-08-16 | Discharge: 2016-08-16 | Disposition: A | Discharge status: Home / Self Care | Payer: Medicare Other | Source: Ambulatory Visit | Attending: Cardiology | Admitting: Cardiology

## 2016-08-16 DIAGNOSIS — I5022 Chronic systolic (congestive) heart failure: Secondary | ICD-10-CM

## 2016-08-16 LAB — BASIC METABOLIC PANEL
Anion gap: 12 (ref 5–15)
BUN: 49 mg/dL — AB (ref 6–20)
CALCIUM: 9.7 mg/dL (ref 8.9–10.3)
CO2: 22 mmol/L (ref 22–32)
Chloride: 104 mmol/L (ref 101–111)
Creatinine, Ser: 1.55 mg/dL — ABNORMAL HIGH (ref 0.44–1.00)
GFR calc Af Amer: 39 mL/min — ABNORMAL LOW (ref >60)
GFR, EST NON AFRICAN AMERICAN: 34 mL/min — AB (ref >60)
GLUCOSE: 187 mg/dL — AB (ref 65–99)
Potassium: 4.9 mmol/L (ref 3.5–5.1)
Sodium: 138 mmol/L (ref 135–145)

## 2016-08-19 ENCOUNTER — Encounter (HOSPITAL_COMMUNITY): Admission: RE | Admit: 2016-08-19 | Payer: Medicare Other | Source: Ambulatory Visit

## 2016-08-20 ENCOUNTER — Inpatient Hospital Stay: Admission: RE | Admit: 2016-08-20 | Payer: Medicare Other | Source: Ambulatory Visit

## 2016-08-21 ENCOUNTER — Encounter (HOSPITAL_COMMUNITY): Payer: Medicare Other

## 2016-08-22 ENCOUNTER — Encounter (HOSPITAL_COMMUNITY): Payer: Self-pay | Admitting: *Deleted

## 2016-08-22 NOTE — Progress Notes (Signed)
Cardiac Individual Treatment Plan  Patient Details  Name: Christine Knight MRN: [id number] Date of Birth: 25-Dec-1950 Referring Provider:   Flowsheet Row CARDIAC REHAB PHASE II ORIENTATION from 07/11/2016 in [place]  Referring Provider  Glori Bickers MD      Initial Encounter Date:  [place] PHASE II ORIENTATION from 07/11/2016 in [place]  Date  07/11/16  Referring Provider  Glori Bickers MD      Visit Diagnosis: No diagnosis found.  Patient's Home Medications on Admission:  Current Outpatient Prescriptions:  .  acetaminophen (TYLENOL) 500 MG tablet, Take 1,000 mg by mouth every 12 (twelve) hours as needed for mild pain or moderate pain., Disp: , Rfl:  .  aspirin 81 MG tablet, Take 1 tablet (81 mg total) by mouth daily., Disp: 30 tablet, Rfl: 9 .  atorvastatin (LIPITOR) 20 MG tablet, Take 20 mg by mouth daily., Disp: , Rfl:  .  Blood Glucose Monitoring Suppl (ONETOUCH VERIO) W/DEVICE KIT, 1 Act by Does not apply route 3 (three) times daily., Disp: 2 kit, Rfl: 0 .  gabapentin (NEURONTIN) 300 MG capsule, Take 300 mg by mouth at bedtime., Disp: , Rfl:  .  glucose blood test strip, Use TID, Disp: 100 each, Rfl: 12 .  Multiple Vitamins-Minerals (CENTRUM SILVER 50+WOMEN PO), Take 1 tablet by mouth every morning., Disp: , Rfl:  .  torsemide (DEMADEX) 20 MG tablet, Take one tablet every morning of Monday Wednesday and Friday, Disp: 30 tablet, Rfl: 6  Past Medical History: Past Medical History:  Diagnosis Date  . CAD (coronary artery disease) 08/2011   NSTEMI with subsequent CABG 08/13/2011 with LIMA-LAD, SVG-diagonal, SVG-OM, SVG-PDA  . Carpal tunnel syndrome on both sides    "post OHS in 08/2011; resolved now" (03/17/2013)  . CHF (congestive heart failure) ([place])   . Chronic systolic heart failure (HCC)    Chronic systolic CHF  . Diabetes mellitus type 2 in obese ([place]) 08/10/11  . Ejection  fraction < 50%    EF 20%, October, 2012  //  EF 25% December, 2012  //   EF 35%, echo, September, 2013  . Hyperlipidemia 08/10/11  . Hypertension   . Ischemic cardiomyopathy 08/13/2011   EF 20% in 08/2011, still 25% 10/21/2011  . Mitral regurgitation    Mild by echo 10/21/11  . Myocardial infarction 08/2011  . Obesity, morbid ([place])   . Orthopnoea    "progressively worse over last 3 wks" (03/17/2013)  . Pleural effusion    Requiring L thoracentesis 09/02/11  . Pulmonary hypertension    Echo, September, 2013, 72 mmHg.    Tobacco Use: History  Smoking Status  . Former Smoker  . Types: Cigarettes  . Quit date: 11/11/1978  Smokeless Tobacco  . Never Used    Comment: 03/17/2013 "only a social smoker when I did smoke; ever bought any"    Labs: Recent Review Flowsheet Data    Labs for ITP Cardiac and Pulmonary Rehab Latest Ref Rng & Units 04/22/2016 04/23/2016 04/24/2016 04/25/2016 04/26/2016   Cholestrol 0 - 200 mg/dL - - - - -   LDLCALC 0 - 99 mg/dL - - - - -   HDL >40 mg/dL - - - - -   Trlycerides <150 mg/dL - - - - -   Hemoglobin A1c 4.8 - 5.6 % - - - - -   PHART 7.350 - 7.450 - - - - -   PCO2ART 35.0 - 45.0 mmHg - - - - -  HCO3 20.0 - 24.0 mEq/L - - - - -   TCO2 0 - 100 mmol/L - - - - -   ACIDBASEDEF 0.0 - 2.0 mmol/L - - - - -   O2SAT % 80.1 73.3 63.8 60.8 68.8      Capillary Blood Glucose: Lab Results  Component Value Date   GLUCAP 157 (H) 08/12/2016   GLUCAP 186 (H) 08/02/2016   GLUCAP 196 (H) 07/29/2016   GLUCAP 181 (H) 07/29/2016   GLUCAP 82 07/24/2016     Exercise Target Goals:    Exercise Program Goal: Individual exercise prescription set with THRR, safety & activity barriers. Participant demonstrates ability to understand and report RPE using BORG scale, to self-measure pulse accurately, and to acknowledge the importance of the exercise prescription.  Exercise Prescription Goal: Starting with aerobic activity 30 plus minutes a day, 3 days per week for initial  exercise prescription. Provide home exercise prescription and guidelines that participant acknowledges understanding prior to discharge.  Activity Barriers & Risk Stratification:     Activity Barriers & Cardiac Risk Stratification - 07/11/16 0817      Activity Barriers & Cardiac Risk Stratification   Activity Barriers None   Cardiac Risk Stratification High      6 Minute Walk:     6 Minute Walk    Row Name 07/11/16 1220 07/11/16 1233       6 Minute Walk   Phase Initial  -    Distance 860 feet  -    Walk Time 6 minutes  -    # of Rest Breaks 0  -    MPH 1.6  -    METS 1.49  -    RPE 13  -    Perceived Dyspnea   - 1    VO2 Peak 5.2  -    Symptoms Yes (comment)  -    Comments fatigue  -    Resting HR 65 bpm  -    Resting BP 108/72  -    Max Ex. HR 83 bpm  -    Max Ex. BP 110/70  -    2 Minute Post BP 98/60  -       Initial Exercise Prescription:     Initial Exercise Prescription - 07/11/16 1200      Date of Initial Exercise RX and Referring Provider   Date 07/11/16   Referring Provider Bensimhon, Daniel MD     Recumbant Bike   Level 1   Watts 10   Minutes 10   METs 2.29     NuStep   Level 2   Minutes 10   METs 2     Track   Laps 7   Minutes 10   METs 2.2     Prescription Details   Frequency (times per week) 3   Duration Progress to 30 minutes of continuous aerobic without signs/symptoms of physical distress     Intensity   THRR 40-80% of Max Heartrate 62-125   Ratings of Perceived Exertion 11-13   Perceived Dyspnea 0-4     Progression   Progression Continue to progress workloads to maintain intensity without signs/symptoms of physical distress.     Resistance Training   Training Prescription Yes   Weight 1   Reps 10-12      Perform Capillary Blood Glucose checks as needed.  Exercise Prescription Changes:     Exercise Prescription Changes    Row Name 07/26/16 0700 08/19/16 1700  Exercise Review   Progression Yes -         Response to Exercise   Blood Pressure (Admit) 104/70 110/72      Blood Pressure (Exercise) 120/70 124/78      Blood Pressure (Exit) 100/60 108/70      Heart Rate (Admit) 48 bpm 54 bpm      Heart Rate (Exercise) 70 bpm 90 bpm      Heart Rate (Exit) 48 bpm 53 bpm      Rating of Perceived Exertion (Exercise) 13 14      Comments  - Reviewed home exercise guidelines on 08/12/16.      Duration Progress to 30 minutes of continuous aerobic without signs/symptoms of physical distress Progress to 30 minutes of continuous aerobic without signs/symptoms of physical distress      Intensity THRR unchanged THRR unchanged        Progression   Progression Continue to progress workloads to maintain intensity without signs/symptoms of physical distress. Continue to progress workloads to maintain intensity without signs/symptoms of physical distress.      Average METs 1.9 1.8        Resistance Training   Training Prescription Yes Yes      Weight 1 2lbs      Reps 10-12 10-12        Interval Training   Interval Training No No        Recumbant Bike   Level 1 1      Watts 8 8      Minutes 10 10      METs 2.22 1.4        NuStep   Level 2 2      Minutes 10 10      METs 1.5 1.5        Track   Laps 5 8      Minutes 10 10      METs 1.84 2.39        Home Exercise Plan   Plans to continue exercise at  [facility] (comment)      Frequency  - Add 2 additional days to program exercise sessions.         Exercise Comments:     Exercise Comments    Row Name 07/26/16 0725 08/12/16 1648         Exercise Comments Off to a slow start with exercise due to low pre-exercise blood sugar. Reviewed home exercise guidelines with participant. Plans to go to [employer] and walk as her mode of home exercise.         Discharge Exercise Prescription (Final Exercise Prescription Changes):     Exercise Prescription Changes - 08/19/16 1700      Exercise Review   Progression --      Response to Exercise   Blood Pressure (Admit) 110/72   Blood Pressure (Exercise) 124/78   Blood Pressure (Exit) 108/70   Heart Rate (Admit) 54 bpm   Heart Rate (Exercise) 90 bpm   Heart Rate (Exit) 53 bpm   Rating of Perceived Exertion (Exercise) 14   Comments Reviewed home exercise guidelines on 08/12/16.   Duration Progress to 30 minutes of continuous aerobic without signs/symptoms of physical distress   Intensity THRR unchanged     Progression   Progression Continue to progress workloads to maintain intensity without signs/symptoms of physical distress.   Average METs 1.8     Resistance Training   Training Prescription Yes   Weight 2lbs  Reps 10-12     Interval Training   Interval Training No     Recumbant Bike   Level 1   Watts 8   Minutes 10   METs 1.4     NuStep   Level 2   Minutes 10   METs 1.5     Track   Laps 8   Minutes 10   METs 2.39     Home Exercise Plan   Plans to continue exercise at [employer] (comment)   Frequency Add 2 additional days to program exercise sessions.      Nutrition:  Target Goals: Understanding of nutrition guidelines, daily intake of sodium '1500mg'$ , cholesterol '200mg'$ , calories 30% from fat and 7% or less from saturated fats, daily to have 5 or more servings of fruits and vegetables.  Biometrics:     Pre Biometrics - 07/11/16 1234      Pre Biometrics   Waist Circumference 47 inches   Hip Circumference 48.5 inches   Waist to Hip Ratio 0.97 %   Triceps Skinfold 37 mm   % Body Fat 50.4 %   Grip Strength 19 kg   Flexibility 8 in   Single Leg Stand 2.65 seconds       Nutrition Therapy Plan and Nutrition Goals:     Nutrition Therapy & Goals - 07/17/16 1233      Nutrition Therapy   Diet Carb Modified, Therapeutic Lifestyle Change     Personal Nutrition Goals   Personal Goal #1 Improved glycemic control as evidenced by A1c trending toward less than 7%     Intervention Plan   Intervention Prescribe, educate  and counsel regarding individualized specific dietary modifications aiming towards targeted core components such as weight, hypertension, lipid management, diabetes, heart failure and other comorbidities.;Nutrition handout(s) given to patient.  Pre-exercise Snack Guidelines for DM; List of Endocrinologists in the area   Expected Outcomes Short Term Goal: Understand basic principles of dietary content, such as calories, fat, sodium, cholesterol and nutrients.;Long Term Goal: Adherence to prescribed nutrition plan.      Nutrition Discharge: Nutrition Scores:   Nutrition Goals Re-Evaluation:   Psychosocial: Target Goals: Acknowledge presence or absence of depression, maximize coping skills, provide positive support system. Participant is able to verbalize types and ability to use techniques and skills needed for reducing stress and depression.  Initial Review & Psychosocial Screening:     Initial Psych Review & Screening - 07/25/16 [place]? Yes      Quality of Life Scores:     Quality of Life - 07/29/16 0802      Quality of Life Scores   Health/Function Pre 25.6 %   Socioeconomic Pre 22.75 %   Psych/Spiritual Pre 28.29 %   Family Pre 24 %   GLOBAL Pre 25.26 %      PHQ-9: Recent Review Flowsheet Data    Depression screen [facility] 2/9 01/04/2016 02/17/2012   Decreased Interest 0 0   Down, Depressed, Hopeless 0 0   PHQ - 2 Score 0 0      Psychosocial Evaluation and Intervention:     Psychosocial Evaluation - 08/22/16 1722      Psychosocial Evaluation & Interventions   Interventions Encouraged to exercise with the program and follow exercise prescription;Relaxation education   Comments Pt with health related anxiety and has poor attendance to rehab of uncertain orgin      Psychosocial Re-Evaluation:     Psychosocial  Re-Evaluation    Row Name 07/25/16 1926             Psychosocial Re-Evaluation   Interventions Encouraged to  attend Cardiac Rehabilitation for the exercise;Relaxation education;Stress management education       Comments Pt is dealing with a chronic disease process.  Pt receives some support from her familhy.            Vocational Rehabilitation: Provide vocational rehab assistance to qualifying candidates.   Vocational Rehab Evaluation & Intervention:   Education: Education Goals: Education classes will be provided on a weekly basis, covering required topics. Participant will state understanding/return demonstration of topics presented.  Learning Barriers/Preferences:     Learning Barriers/Preferences - 07/11/16 0817      Learning Barriers/Preferences   Learning Barriers Sight   Learning Preferences Skilled Demonstration      Education Topics: Count Your Pulse:  -Group instruction provided by verbal instruction, demonstration, patient participation and written materials to support subject.  Instructors address importance of being able to find your pulse and how to count your pulse when at home without a heart monitor.  Patients get hands on experience counting their pulse with staff help and individually.   Heart Attack, Angina, and Risk Factor Modification:  -Group instruction provided by verbal instruction, video, and written materials to support subject.  Instructors address signs and symptoms of angina and heart attacks.    Also discuss risk factors for heart disease and how to make changes to improve heart health risk factors.   Functional Fitness:  -Group instruction provided by verbal instruction, demonstration, patient participation, and written materials to support subject.  Instructors address safety measures for doing things around the house.  Discuss how to get up and down off the floor, how to pick things up properly, how to safely get out of a chair without assistance, and balance training.   Meditation and Mindfulness:  -Group instruction provided by verbal instruction,  patient participation, and written materials to support subject.  Instructor addresses importance of mindfulness and meditation practice to help reduce stress and improve awareness.  Instructor also leads participants through a meditation exercise.    Stretching for Flexibility and Mobility:  -Group instruction provided by verbal instruction, patient participation, and written materials to support subject.  Instructors lead participants through series of stretches that are designed to increase flexibility thus improving mobility.  These stretches are additional exercise for major muscle groups that are typically performed during regular warm up and cool down.   Hands Only CPR Anytime:  -Group instruction provided by verbal instruction, video, patient participation and written materials to support subject.  Instructors co-teach with AHA video for hands only CPR.  Participants get hands on experience with mannequins.   Nutrition I class: Heart Healthy Eating:  -Group instruction provided by PowerPoint slides, verbal discussion, and written materials to support subject matter. The instructor gives an explanation and review of the Therapeutic Lifestyle Changes diet recommendations, which includes a discussion on lipid goals, dietary fat, sodium, fiber, plant stanol/sterol esters, sugar, and the components of a well-balanced, healthy diet. Flowsheet Row CARDIAC REHAB PHASE II EXERCISE from 08/12/2016 in [facility] CARDIAC REHAB  Date  08/12/16  Educator  RD  Instruction Review Code  Not applicable [class handouts given]      Nutrition II class: Lifestyle Skills:  -Group instruction provided by PowerPoint slides, verbal discussion, and written materials to support subject matter. The instructor gives an explanation and review of label reading,  grocery shopping for heart health, heart healthy recipe modifications, and ways to make healthier choices when eating out. Flowsheet Row  CARDIAC REHAB PHASE II EXERCISE from 08/12/2016 in [place]  Date  08/12/16  Educator  RD  Instruction Review Code  Not applicable [class handouts given]      Diabetes Question & Answer:  -Group instruction provided by PowerPoint slides, verbal discussion, and written materials to support subject matter. The instructor gives an explanation and review of diabetes co-morbidities, pre- and post-prandial blood glucose goals, pre-exercise blood glucose goals, signs, symptoms, and treatment of hypoglycemia and hyperglycemia, and foot care basics. Flowsheet Row CARDIAC REHAB PHASE II EXERCISE from 08/12/2016 in [place]  Date  07/19/16  Educator  RD  Instruction Review Code  2- meets goals/outcomes      Diabetes Blitz:  -Group instruction provided by PowerPoint slides, verbal discussion, and written materials to support subject matter. The instructor gives an explanation and review of the physiology behind type 1 and type 2 diabetes, diabetes medications and rational behind using different medications, pre- and post-prandial blood glucose recommendations and Hemoglobin A1c goals, diabetes diet, and exercise including blood glucose guidelines for exercising safely.  Flowsheet Row CARDIAC REHAB PHASE II EXERCISE from 08/12/2016 in [place]  Date  08/12/16  Educator  RD  Instruction Review Code  Not applicable [class handouts given]      Portion Distortion:  -Group instruction provided by PowerPoint slides, verbal discussion, written materials, and food models to support subject matter. The instructor gives an explanation of serving size versus portion size, changes in portions sizes over the last 20 years, and what consists of a serving from each food group.   Stress Management:  -Group instruction provided by verbal instruction, video, and written materials to support subject matter.  Instructors  review role of stress in heart disease and how to cope with stress positively.     Exercising on Your Own:  -Group instruction provided by verbal instruction, power point, and written materials to support subject.  Instructors discuss benefits of exercise, components of exercise, frequency and intensity of exercise, and end points for exercise.  Also discuss use of nitroglycerin and activating EMS.  Review options of places to exercise outside of rehab.  Review guidelines for sex with heart disease.   Cardiac Drugs I:  -Group instruction provided by verbal instruction and written materials to support subject.  Instructor reviews cardiac drug classes: antiplatelets, anticoagulants, beta blockers, and statins.  Instructor discusses reasons, side effects, and lifestyle considerations for each drug class.   Cardiac Drugs II:  -Group instruction provided by verbal instruction and written materials to support subject.  Instructor reviews cardiac drug classes: angiotensin converting enzyme inhibitors (ACE-I), angiotensin II receptor blockers (ARBs), nitrates, and calcium channel blockers.  Instructor discusses reasons, side effects, and lifestyle considerations for each drug class. Flowsheet Row CARDIAC REHAB PHASE II EXERCISE from 08/12/2016 in [place]  Date  07/24/16  Educator  pharmacy  Instruction Review Code  2- meets goals/outcomes      Anatomy and Physiology of the Circulatory System:  -Group instruction provided by verbal instruction, video, and written materials to support subject.  Reviews functional anatomy of heart, how it relates to various diagnoses, and what role the heart plays in the overall system. 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 and lose 8lbs; Long: lose 20lbs, feel better   Intervention provide exercise programming and nutrition guidelines to aid in overall cardiovascular fitness and weight loss   Expected Outcomes patient will be able to fish and lose 8-10lbs      Core Components/Risk Factors/Patient Goals Review:      Goals and Risk Factor Review    Row Name 07/26/16 0726 08/19/16 1704           Core Components/Risk Factors/Patient Goals Review   Personal Goals Review Other Other      Review Patient off to a slow start with exercise due to low pre-exercise blood sugar readings.  Patient is walking and going to [employer] for her home exercise program.      Expected Outcomes Begin regular exercise routine to improve cardiorespiratory fitness and help achieve weight loss goals. Maintain consistent exercise routine to improve cardiorespiratory fitness and help achieve weight loss goals.         Core Components/Risk Factors/Patient Goals at Discharge (Final Review):      Goals and Risk Factor Review - 08/19/16 1704      Core Components/Risk Factors/Patient Goals Review   Personal Goals Review Other   Review Patient is walking and going to [employer] for her home exercise program.   Expected Outcomes Maintain consistent exercise routine to improve cardiorespiratory fitness and help achieve weight loss goals.      ITP Comments:     ITP Comments    Row Name 07/11/16 0815           ITP Comments Dr. Sheppard Evens Director          Comments:  Pt is making expected progress toward personal goals after completing 6 sessions. Continue to strive for  improved management of her diease process - chf. Continue to educate and support pt with her rehab efforts. Recommend continued exercise and life style modification education including  stress management and relaxation techniques to decrease cardiac risk profile. [place] [employer] operational officer, BSN;l

## 2016-08-23 ENCOUNTER — Encounter (HOSPITAL_COMMUNITY): Payer: Medicare Other

## 2016-08-23 MED FILL — GABAPENTIN 300 MG CAPSULE: 300 | 30 days supply | Qty: 30 | Fill #0

## 2016-08-25 ENCOUNTER — Inpatient Hospital Stay (HOSPITAL_COMMUNITY)
Admission: EM | Admit: 2016-08-25 | Discharge: 2016-09-08 | DRG: 291 | Disposition: A | Discharge status: Home / Self Care | Payer: Medicare Other | Attending: Internal Medicine | Admitting: Internal Medicine

## 2016-08-25 ENCOUNTER — Encounter (HOSPITAL_COMMUNITY): Payer: Self-pay

## 2016-08-25 ENCOUNTER — Emergency Department (HOSPITAL_COMMUNITY): Payer: Medicare Other

## 2016-08-25 DIAGNOSIS — Z7982 Long term (current) use of aspirin: Secondary | ICD-10-CM

## 2016-08-25 DIAGNOSIS — M6281 Muscle weakness (generalized): Secondary | ICD-10-CM

## 2016-08-25 DIAGNOSIS — I130 Hypertensive heart and chronic kidney disease with heart failure and stage 1 through stage 4 chronic kidney disease, or unspecified chronic kidney disease: Principal | ICD-10-CM | POA: Diagnosis present

## 2016-08-25 DIAGNOSIS — E785 Hyperlipidemia, unspecified: Secondary | ICD-10-CM | POA: Diagnosis present

## 2016-08-25 DIAGNOSIS — I5033 Acute on chronic diastolic (congestive) heart failure: Secondary | ICD-10-CM | POA: Diagnosis present

## 2016-08-25 DIAGNOSIS — N183 Chronic kidney disease, stage 3 unspecified: Secondary | ICD-10-CM | POA: Diagnosis present

## 2016-08-25 DIAGNOSIS — I340 Nonrheumatic mitral (valve) insufficiency: Secondary | ICD-10-CM | POA: Diagnosis present

## 2016-08-25 DIAGNOSIS — Z87891 Personal history of nicotine dependence: Secondary | ICD-10-CM

## 2016-08-25 DIAGNOSIS — I272 Pulmonary hypertension, unspecified: Secondary | ICD-10-CM | POA: Diagnosis present

## 2016-08-25 DIAGNOSIS — E039 Hypothyroidism, unspecified: Secondary | ICD-10-CM | POA: Diagnosis present

## 2016-08-25 DIAGNOSIS — I252 Old myocardial infarction: Secondary | ICD-10-CM

## 2016-08-25 DIAGNOSIS — I2510 Atherosclerotic heart disease of native coronary artery without angina pectoris: Secondary | ICD-10-CM | POA: Diagnosis present

## 2016-08-25 DIAGNOSIS — E1140 Type 2 diabetes mellitus with diabetic neuropathy, unspecified: Secondary | ICD-10-CM | POA: Diagnosis present

## 2016-08-25 DIAGNOSIS — Z8249 Family history of ischemic heart disease and other diseases of the circulatory system: Secondary | ICD-10-CM

## 2016-08-25 DIAGNOSIS — I509 Heart failure, unspecified: Secondary | ICD-10-CM

## 2016-08-25 DIAGNOSIS — I255 Ischemic cardiomyopathy: Secondary | ICD-10-CM | POA: Diagnosis present

## 2016-08-25 DIAGNOSIS — E876 Hypokalemia: Secondary | ICD-10-CM | POA: Diagnosis not present

## 2016-08-25 DIAGNOSIS — E861 Hypovolemia: Secondary | ICD-10-CM | POA: Diagnosis not present

## 2016-08-25 DIAGNOSIS — Z6836 Body mass index (BMI) 36.0-36.9, adult: Secondary | ICD-10-CM

## 2016-08-25 DIAGNOSIS — Z951 Presence of aortocoronary bypass graft: Secondary | ICD-10-CM

## 2016-08-25 DIAGNOSIS — N179 Acute kidney failure, unspecified: Secondary | ICD-10-CM | POA: Diagnosis present

## 2016-08-25 DIAGNOSIS — I5043 Acute on chronic combined systolic (congestive) and diastolic (congestive) heart failure: Secondary | ICD-10-CM | POA: Diagnosis present

## 2016-08-25 DIAGNOSIS — E1165 Type 2 diabetes mellitus with hyperglycemia: Secondary | ICD-10-CM | POA: Diagnosis present

## 2016-08-25 DIAGNOSIS — IMO0002 Reserved for concepts with insufficient information to code with codable children: Secondary | ICD-10-CM | POA: Diagnosis present

## 2016-08-25 DIAGNOSIS — E0781 Sick-euthyroid syndrome: Secondary | ICD-10-CM | POA: Diagnosis present

## 2016-08-25 DIAGNOSIS — R001 Bradycardia, unspecified: Secondary | ICD-10-CM | POA: Diagnosis present

## 2016-08-25 DIAGNOSIS — E1122 Type 2 diabetes mellitus with diabetic chronic kidney disease: Secondary | ICD-10-CM | POA: Diagnosis present

## 2016-08-25 DIAGNOSIS — R079 Chest pain, unspecified: Secondary | ICD-10-CM | POA: Diagnosis not present

## 2016-08-25 DIAGNOSIS — Z79899 Other long term (current) drug therapy: Secondary | ICD-10-CM

## 2016-08-25 HISTORY — DX: Dyspnea, unspecified: R06.00

## 2016-08-25 LAB — TROPONIN I: TROPONIN I: 0.05 ng/mL — AB (ref <0.03)

## 2016-08-25 LAB — HEPATIC FUNCTION PANEL
ALBUMIN: 3.9 g/dL (ref 3.5–5.0)
ALK PHOS: 116 U/L (ref 38–126)
ALT: 16 U/L (ref 14–54)
AST: 44 U/L — ABNORMAL HIGH (ref 15–41)
BILIRUBIN TOTAL: 1.7 mg/dL — AB (ref 0.3–1.2)
Bilirubin, Direct: 0.6 mg/dL — ABNORMAL HIGH (ref 0.1–0.5)
Indirect Bilirubin: 1.1 mg/dL — ABNORMAL HIGH (ref 0.3–0.9)
Total Protein: 7.7 g/dL (ref 6.5–8.1)

## 2016-08-25 LAB — BASIC METABOLIC PANEL
ANION GAP: 8 (ref 5–15)
BUN: 33 mg/dL — ABNORMAL HIGH (ref 6–20)
CALCIUM: 9.6 mg/dL (ref 8.9–10.3)
CO2: 24 mmol/L (ref 22–32)
Chloride: 105 mmol/L (ref 101–111)
Creatinine, Ser: 1.5 mg/dL — ABNORMAL HIGH (ref 0.44–1.00)
GFR, EST AFRICAN AMERICAN: 41 mL/min — AB (ref >60)
GFR, EST NON AFRICAN AMERICAN: 35 mL/min — AB (ref >60)
Glucose, Bld: 189 mg/dL — ABNORMAL HIGH (ref 65–99)
Potassium: 4.2 mmol/L (ref 3.5–5.1)
SODIUM: 137 mmol/L (ref 135–145)

## 2016-08-25 LAB — CBC
HCT: 40.7 % (ref 36.0–46.0)
HEMOGLOBIN: 13.5 g/dL (ref 12.0–15.0)
MCH: 31.1 pg (ref 26.0–34.0)
MCHC: 33.2 g/dL (ref 30.0–36.0)
MCV: 93.8 fL (ref 78.0–100.0)
Platelets: 233 10*3/uL (ref 150–400)
RBC: 4.34 MIL/uL (ref 3.87–5.11)
RDW: 16.3 % — ABNORMAL HIGH (ref 11.5–15.5)
WBC: 5.2 10*3/uL (ref 4.0–10.5)

## 2016-08-25 LAB — I-STAT TROPONIN, ED: Troponin i, poc: 0.06 ng/mL (ref 0.00–0.08)

## 2016-08-25 LAB — BRAIN NATRIURETIC PEPTIDE: B Natriuretic Peptide: 1359.9 pg/mL — ABNORMAL HIGH (ref 0.0–100.0)

## 2016-08-25 LAB — MAGNESIUM: Magnesium: 2.2 mg/dL (ref 1.7–2.4)

## 2016-08-25 MED ORDER — ASPIRIN 81 MG PO CHEW
324.0000 mg | CHEWABLE_TABLET | Freq: Once | ORAL | Status: AC
  Administered 2016-08-25: 324 mg via ORAL
  Filled 2016-08-25: qty 4

## 2016-08-25 MED ORDER — FUROSEMIDE 10 MG/ML IJ SOLN
40.0000 mg | Freq: Once | INTRAMUSCULAR | Status: AC
  Administered 2016-08-25: 40 mg via INTRAVENOUS
  Filled 2016-08-25: qty 4

## 2016-08-25 NOTE — ED Provider Notes (Signed)
[facility] DEPT Provider Note   CSN: [id number] Arrival date & time: 08/25/16  1632     History   Chief Complaint Chief Complaint  Patient presents with  . Chest Pain    HPI Christine Knight is a 65 y.o. female.  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 The patient states that over the last several weeks, she has been titrating her Lasix as an outpatient. She had a mild AK I and will subsequent taken off of her Lasix. These have been reintroduced over the last 2 weeks but she is only taking it 3 times a week. Over this time period, she has had progressively worsening lower extremity edema, orthopnea, and chest pain with exertion. She has gained 30 pounds over the last several weeks. She states that she has gotten so severe that she now has shortness of breath at rest and subsequent presents for evaluation. She has a history of previous CHF exacerbations with similar symptoms. Denies any other recent medication changes. She has been compliant with her medications otherwise. Denies any fevers or sputum production.  Past Medical History:  Diagnosis Date  . CAD (coronary artery disease) 08/2011   NSTEMI with subsequent CABG 08/13/2011 with LIMA-LAD, SVG-diagonal, SVG-OM, SVG-PDA  . Carpal tunnel syndrome on both sides    "post OHS in 08/2011; resolved now" (03/17/2013)  . CHF (congestive heart failure) ([place])   . Chronic systolic heart failure (HCC)    Chronic systolic CHF  . Diabetes mellitus type 2 in obese ([place]) 08/10/11  . Ejection fraction < 50%    EF 20%, October, 2012  //  EF 25% December, 2012  //   EF 35%, echo, September, 2013  . Hyperlipidemia 08/10/11  . Hypertension   . Ischemic cardiomyopathy 08/13/2011   EF 20% in 08/2011, still 25% 10/21/2011  . Mitral regurgitation    Mild by echo 10/21/11  . Myocardial infarction 08/2011    . Obesity, morbid ([place])   . Orthopnoea    "progressively worse over last 3 wks" (03/17/2013)  . Pleural effusion    Requiring L thoracentesis 09/02/11  . Pulmonary hypertension    Echo, September, 2013, 72 mmHg.    Patient Active Problem List   Diagnosis Date Noted  . Acute on chronic combined systolic and diastolic congestive heart failure ([place]) 08/26/2016  . CKD (chronic kidney disease) stage 3, GFR 30-59 ml/min 05/02/2016  . Acute on chronic systolic heart failure, NYHA class 3 ([place]) 04/11/2016  . Acute on chronic systolic and diastolic heart failure, NYHA class 4 ([place]) 04/11/2016  . Chest tightness   . Mass of lower lobe of left lung 07/25/2015  . Primary gout 07/25/2015  . Renal insufficiency   . NSTEMI- declined cath 05/29/2015  . DJD (degenerative joint disease), multiple sites 11/07/2014  . Painful diabetic neuropathy ([place]) 10/10/2014  . Chronic systolic heart failure ([place]) 04/02/2013  . Hypothyroidism 03/18/2013  . Pulmonary hypertension ([place])   . Hx of CABG 2012 08/30/2011  . Obesity, morbid-BMI 45   . Type 2 diabetes, uncontrolled, with neuropathy ([place])   . Ischemic cardiomyopathy-30-35% May 2016 08/13/2011  . Hyperlipidemia LDL goal <70 08/10/2011    Past Surgical History:  Procedure Laterality Date  . CARDIAC CATHETERIZATION N/A 12/13/2015   Procedure: Right/Left Heart Cath and Coronary/Graft Angiography;  Surgeon: Peter M Martinique, MD;  Location: [facility];  Service: Cardiovascular;  Laterality:  N/A;  . CORONARY ARTERY BYPASS GRAFT  08/13/11   CABG x4 with LIMA to LAD, SVG to Diag, SVG to OM, SVG to PDA, EVH via both thighs  . LAPAROSCOPY  1980's?   "removed gallstones; not gallbladder" (03/17/2013)    OB History    No data available       Home Medications    Prior to Admission medications   Medication Sig Start Date End Date Taking? Authorizing Provider  aspirin 81 MG tablet Take 1 tablet (81 mg total) by mouth daily. 11/15/13  Yes Luis Abed, MD   gabapentin (NEURONTIN) 300 MG capsule Take 300 mg by mouth at bedtime.   Yes Historical Provider, MD  Multiple Vitamins-Minerals (CENTRUM SILVER 50+WOMEN PO) Take 1 tablet by mouth every morning.   Yes Historical Provider, MD  torsemide (DEMADEX) 20 MG tablet Take one tablet every morning of Monday Wednesday and Friday Patient taking differently: Take 20 mg by mouth See admin instructions. Take one tablet every morning of Monday Wednesday and Friday 08/07/16  Yes Amy D Clegg, NP  traMADol (ULTRAM) 50 MG tablet Take 50 mg by mouth every 6 (six) hours as needed for moderate pain.    Yes Historical Provider, MD  Blood Glucose Monitoring Suppl (ONETOUCH VERIO) W/DEVICE KIT 1 Act by Does not apply route 3 (three) times daily. 10/12/14   Etta Grandchild, MD  glucose blood test strip Use TID 10/12/14   Etta Grandchild, MD    Family History Family History  Problem Relation Age of Onset  . Heart disease Father   . Hypertension Maternal Grandmother   . Hypertension Sister   . Cancer Neg Hx   . Diabetes Neg Hx   . Kidney disease Neg Hx     Social History Social History  Substance Use Topics  . Smoking status: Former Smoker    Types: Cigarettes    Quit date: 11/11/1978  . Smokeless tobacco: Never Used     Comment: 03/17/2013 "only a social smoker when I did smoke; ever bought any"  . Alcohol use Yes     Comment: 03/17/2013 "in the last 6 months I've had 1 glass of wine"     Allergies   Lisinopril and Metformin and related   Review of Systems Review of Systems  Constitutional: Positive for fatigue. Negative for chills and fever.  HENT: Negative for congestion, rhinorrhea and sore throat.   Eyes: Negative for visual disturbance.  Respiratory: Positive for chest tightness and shortness of breath. Negative for cough and wheezing.   Cardiovascular: Positive for chest pain and leg swelling.  Gastrointestinal: Negative for abdominal pain, diarrhea, nausea and vomiting.  Genitourinary: Negative for  dysuria, flank pain, vaginal bleeding and vaginal discharge.  Musculoskeletal: Negative for neck pain.  Skin: Negative for rash.  Allergic/Immunologic: Negative for immunocompromised state.  Neurological: Positive for weakness (Generalized). Negative for syncope and headaches.  Hematological: Does not bruise/bleed easily.  All other systems reviewed and are negative.    Physical Exam Updated Vital Signs BP 117/85   Pulse 66   Temp 97.7 F (36.5 C) (Oral)   Resp 18   Ht 5\' 6"  (1.676 m)   Wt 268 lb 1.6 oz (121.6 kg)   SpO2 94%   BMI 43.27 kg/m   Physical Exam  Constitutional: She is oriented to person, place, and time. She appears well-developed and well-nourished. No distress.  HENT:  Head: Normocephalic and atraumatic.  Mouth/Throat: Oropharynx is clear and moist.  Eyes: Conjunctivae are  normal.  Neck: Neck supple.  Cardiovascular: Normal rate, regular rhythm and normal heart sounds.  Exam reveals no friction rub.   No murmur heard. Pulmonary/Chest: Effort normal. Tachypnea noted. No respiratory distress. She has no wheezes. She has rales in the right lower field and the left lower field.  Abdominal: She exhibits no distension.  Musculoskeletal: She exhibits edema (2+ pitting bilateral lower extremities).  Neurological: She is alert and oriented to person, place, and time. She exhibits normal muscle tone.  Skin: Skin is warm. Capillary refill takes less than 2 seconds.  Psychiatric: She has a normal mood and affect.  Nursing note and vitals reviewed.    ED Treatments / Results  Labs (all labs ordered are listed, but only abnormal results are displayed) Labs Reviewed  BASIC METABOLIC PANEL - Abnormal; Notable for the following:       Result Value   Glucose, Bld 189 (*)    BUN 33 (*)    Creatinine, Ser 1.50 (*)    GFR calc non Af Amer 35 (*)    GFR calc Af Amer 41 (*)    All other components within normal limits  CBC - Abnormal; Notable for the following:    RDW  16.3 (*)    All other components within normal limits  HEPATIC FUNCTION PANEL - Abnormal; Notable for the following:    AST 44 (*)    Total Bilirubin 1.7 (*)    Bilirubin, Direct 0.6 (*)    Indirect Bilirubin 1.1 (*)    All other components within normal limits  TROPONIN I - Abnormal; Notable for the following:    Troponin I 0.05 (*)    All other components within normal limits  BRAIN NATRIURETIC PEPTIDE - Abnormal; Notable for the following:    B Natriuretic Peptide 1,359.9 (*)    All other components within normal limits  MAGNESIUM  TROPONIN I  TROPONIN I  TROPONIN I  I-STAT TROPOININ, ED    EKG  EKG Interpretation None       Radiology Dg Chest 2 View  Result Date: 08/25/2016 CLINICAL DATA:  Initial evaluation for 1 week history of shortness of breath. EXAM: CHEST  2 VIEW COMPARISON:  Prior radiograph from 04/13/2016. FINDINGS: Moderate cardiomegaly, stable. Median sternotomy wires underlying CABG markers and surgical clips noted. Mediastinal silhouette within normal limits. Aortic atherosclerosis noted. Lungs are normally inflated. Vascular congestion with indistinctness of the interstitial markings, suggestive of mild diffuse pulmonary interstitial edema. Small bilateral pleural effusions. No focal infiltrates. No pneumothorax. No acute osseous abnormality. IMPRESSION: 1. Cardiomegaly with mild diffuse pulmonary interstitial edema and small bilateral pleural effusions. 2. Sequela of prior CABG. 3. Aortic atherosclerosis. Electronically Signed   By: Jeannine Boga M.D.   On: 08/25/2016 17:34    Procedures Procedures (including critical care time)  Medications Ordered in ED Medications  furosemide (LASIX) injection 40 mg (40 mg Intravenous Given 08/25/16 2150)  aspirin chewable tablet 324 mg (324 mg Oral Given 08/25/16 2229)     Initial Impression / Assessment and Plan / ED Course  I have reviewed the triage vital signs and the nursing notes.  Pertinent labs &  imaging results that were available during my care of the patient were reviewed by me and considered in my medical decision making (see chart for details).  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 On exam, patient is  markedly hypervolemic. Vital signs are stable. Chest x-ray shows overt fluid overload and BNP is elevated at over 1000. Troponin 0.05, which is likely demand and EKG shows no acute ST elevations. Labwork is otherwise unremarkable. Discussed with Dr. Radford Pax of cardiology. Will give IV Lasix and admit to hospitalist service for diuresis and trending of troponins. Patient is chest pain-free at this time.  Final Clinical Impressions(s) / ED Diagnoses   Final diagnoses:  Acute on chronic combined systolic and diastolic congestive heart failure [facility])    New Prescriptions New Prescriptions   No medications on file     Duffy Bruce, MD 08/26/16 [phone]

## 2016-08-25 NOTE — ED Notes (Signed)
Delay in lab draw,  edp examining pt. 

## 2016-08-25 NOTE — ED Notes (Signed)
Dr. [name] notified on pt.'s elevated Troponin.

## 2016-08-25 NOTE — ED Notes (Signed)
IV team consult ordered after 2 unsuccessful attempts to start peripheral IV access.

## 2016-08-25 NOTE — ED Triage Notes (Signed)
Pt reports left side chest pain radiating into the left arm that started yesterday. She reports that has CHF and shortness of breath. Pt reports she was recently taken off her fluid pill and think she is retaining fluid. ANkles appear swollen.

## 2016-08-25 NOTE — ED Notes (Signed)
IV nurse inserted peripheral IV  at left upper arm.

## 2016-08-25 NOTE — ED Notes (Signed)
Dr. [name] notified on pt.'s frequent irregular HR = 60's- 40's , asymptomatic , resting with no chest pain / respirations unlabored. .Christine Knight

## 2016-08-25 NOTE — ED Notes (Signed)
Nurse will get labs 

## 2016-08-26 ENCOUNTER — Encounter (HOSPITAL_COMMUNITY): Admission: RE | Admit: 2016-08-26 | Payer: Medicare Other | Source: Ambulatory Visit

## 2016-08-26 ENCOUNTER — Encounter (HOSPITAL_COMMUNITY): Payer: Self-pay | Admitting: Internal Medicine

## 2016-08-26 ENCOUNTER — Telehealth (HOSPITAL_COMMUNITY): Payer: Self-pay | Admitting: *Deleted

## 2016-08-26 ENCOUNTER — Encounter (HOSPITAL_COMMUNITY): Payer: Medicare Other | Admitting: Internal Medicine

## 2016-08-26 DIAGNOSIS — N183 Chronic kidney disease, stage 3 (moderate): Secondary | ICD-10-CM | POA: Diagnosis not present

## 2016-08-26 DIAGNOSIS — E038 Other specified hypothyroidism: Secondary | ICD-10-CM | POA: Diagnosis not present

## 2016-08-26 DIAGNOSIS — E039 Hypothyroidism, unspecified: Secondary | ICD-10-CM | POA: Diagnosis not present

## 2016-08-26 DIAGNOSIS — I130 Hypertensive heart and chronic kidney disease with heart failure and stage 1 through stage 4 chronic kidney disease, or unspecified chronic kidney disease: Secondary | ICD-10-CM | POA: Diagnosis present

## 2016-08-26 DIAGNOSIS — R079 Chest pain, unspecified: Secondary | ICD-10-CM | POA: Diagnosis present

## 2016-08-26 DIAGNOSIS — E1140 Type 2 diabetes mellitus with diabetic neuropathy, unspecified: Secondary | ICD-10-CM | POA: Diagnosis not present

## 2016-08-26 DIAGNOSIS — I255 Ischemic cardiomyopathy: Secondary | ICD-10-CM | POA: Diagnosis present

## 2016-08-26 DIAGNOSIS — I509 Heart failure, unspecified: Secondary | ICD-10-CM

## 2016-08-26 DIAGNOSIS — Z8249 Family history of ischemic heart disease and other diseases of the circulatory system: Secondary | ICD-10-CM | POA: Diagnosis not present

## 2016-08-26 DIAGNOSIS — N179 Acute kidney failure, unspecified: Secondary | ICD-10-CM | POA: Diagnosis not present

## 2016-08-26 DIAGNOSIS — E0781 Sick-euthyroid syndrome: Secondary | ICD-10-CM | POA: Diagnosis present

## 2016-08-26 DIAGNOSIS — Z7982 Long term (current) use of aspirin: Secondary | ICD-10-CM | POA: Diagnosis not present

## 2016-08-26 DIAGNOSIS — E1165 Type 2 diabetes mellitus with hyperglycemia: Secondary | ICD-10-CM | POA: Diagnosis not present

## 2016-08-26 DIAGNOSIS — I5043 Acute on chronic combined systolic (congestive) and diastolic (congestive) heart failure: Secondary | ICD-10-CM

## 2016-08-26 DIAGNOSIS — I5023 Acute on chronic systolic (congestive) heart failure: Secondary | ICD-10-CM | POA: Diagnosis not present

## 2016-08-26 DIAGNOSIS — I252 Old myocardial infarction: Secondary | ICD-10-CM | POA: Diagnosis not present

## 2016-08-26 DIAGNOSIS — I5033 Acute on chronic diastolic (congestive) heart failure: Secondary | ICD-10-CM | POA: Diagnosis present

## 2016-08-26 DIAGNOSIS — Z951 Presence of aortocoronary bypass graft: Secondary | ICD-10-CM | POA: Diagnosis not present

## 2016-08-26 DIAGNOSIS — Z79899 Other long term (current) drug therapy: Secondary | ICD-10-CM | POA: Diagnosis not present

## 2016-08-26 DIAGNOSIS — Z87891 Personal history of nicotine dependence: Secondary | ICD-10-CM | POA: Diagnosis not present

## 2016-08-26 DIAGNOSIS — E861 Hypovolemia: Secondary | ICD-10-CM | POA: Diagnosis not present

## 2016-08-26 DIAGNOSIS — E785 Hyperlipidemia, unspecified: Secondary | ICD-10-CM | POA: Diagnosis not present

## 2016-08-26 DIAGNOSIS — E876 Hypokalemia: Secondary | ICD-10-CM | POA: Diagnosis not present

## 2016-08-26 DIAGNOSIS — I340 Nonrheumatic mitral (valve) insufficiency: Secondary | ICD-10-CM | POA: Diagnosis present

## 2016-08-26 DIAGNOSIS — Z6836 Body mass index (BMI) 36.0-36.9, adult: Secondary | ICD-10-CM | POA: Diagnosis not present

## 2016-08-26 DIAGNOSIS — I272 Pulmonary hypertension, unspecified: Secondary | ICD-10-CM | POA: Diagnosis present

## 2016-08-26 DIAGNOSIS — I2510 Atherosclerotic heart disease of native coronary artery without angina pectoris: Secondary | ICD-10-CM | POA: Diagnosis present

## 2016-08-26 DIAGNOSIS — E1122 Type 2 diabetes mellitus with diabetic chronic kidney disease: Secondary | ICD-10-CM | POA: Diagnosis present

## 2016-08-26 DIAGNOSIS — R001 Bradycardia, unspecified: Secondary | ICD-10-CM | POA: Diagnosis present

## 2016-08-26 LAB — COMPREHENSIVE METABOLIC PANEL
ALT: 13 U/L — ABNORMAL LOW (ref 14–54)
ANION GAP: 8 (ref 5–15)
AST: 33 U/L (ref 15–41)
Albumin: 3.4 g/dL — ABNORMAL LOW (ref 3.5–5.0)
Alkaline Phosphatase: 108 U/L (ref 38–126)
BUN: 30 mg/dL — AB (ref 6–20)
CHLORIDE: 105 mmol/L (ref 101–111)
CO2: 25 mmol/L (ref 22–32)
Calcium: 9.2 mg/dL (ref 8.9–10.3)
Creatinine, Ser: 1.25 mg/dL — ABNORMAL HIGH (ref 0.44–1.00)
GFR calc Af Amer: 51 mL/min — ABNORMAL LOW (ref >60)
GFR, EST NON AFRICAN AMERICAN: 44 mL/min — AB (ref >60)
Glucose, Bld: 138 mg/dL — ABNORMAL HIGH (ref 65–99)
POTASSIUM: 3.9 mmol/L (ref 3.5–5.1)
Sodium: 138 mmol/L (ref 135–145)
Total Bilirubin: 1.7 mg/dL — ABNORMAL HIGH (ref 0.3–1.2)
Total Protein: 7 g/dL (ref 6.5–8.1)

## 2016-08-26 LAB — GLUCOSE, CAPILLARY
Glucose-Capillary: 106 mg/dL — ABNORMAL HIGH (ref 65–99)
Glucose-Capillary: 123 mg/dL — ABNORMAL HIGH (ref 65–99)

## 2016-08-26 LAB — TROPONIN I
TROPONIN I: 0.04 ng/mL — AB (ref <0.03)
Troponin I: 0.05 ng/mL (ref <0.03)
Troponin I: 0.04 ng/mL (ref <0.03)

## 2016-08-26 LAB — CBC
HEMATOCRIT: 38.6 % (ref 36.0–46.0)
HEMOGLOBIN: 12.7 g/dL (ref 12.0–15.0)
MCH: 31.2 pg (ref 26.0–34.0)
MCHC: 32.9 g/dL (ref 30.0–36.0)
MCV: 94.8 fL (ref 78.0–100.0)
Platelets: 222 10*3/uL (ref 150–400)
RBC: 4.07 MIL/uL (ref 3.87–5.11)
RDW: 16.3 % — AB (ref 11.5–15.5)
WBC: 4.2 10*3/uL (ref 4.0–10.5)

## 2016-08-26 LAB — CBG MONITORING, ED
Glucose-Capillary: 145 mg/dL — ABNORMAL HIGH (ref 65–99)
Glucose-Capillary: 159 mg/dL — ABNORMAL HIGH (ref 65–99)

## 2016-08-26 LAB — TSH: TSH: 3.362 u[IU]/mL (ref 0.350–4.500)

## 2016-08-26 MED ORDER — TRAMADOL HCL 50 MG PO TABS
50.0000 mg | ORAL_TABLET | Freq: Once | ORAL | Status: AC
  Administered 2016-08-26: 50 mg via ORAL
  Filled 2016-08-26: qty 1

## 2016-08-26 MED ORDER — GABAPENTIN 300 MG PO CAPS
300.0000 mg | ORAL_CAPSULE | Freq: Every day | ORAL | Status: DC
  Administered 2016-08-26: 300 mg via ORAL
  Filled 2016-08-26: qty 1

## 2016-08-26 MED ORDER — FUROSEMIDE 10 MG/ML IJ SOLN
40.0000 mg | Freq: Two times a day (BID) | INTRAMUSCULAR | Status: DC
  Administered 2016-08-26: 40 mg via INTRAVENOUS
  Filled 2016-08-26: qty 4

## 2016-08-26 MED ORDER — ACETAMINOPHEN 325 MG PO TABS: 650.0000 mg | ORAL_TABLET | ORAL | Status: DC | PRN

## 2016-08-26 MED ORDER — PANTOPRAZOLE SODIUM 40 MG PO TBEC
40.0000 mg | DELAYED_RELEASE_TABLET | Freq: Every day | ORAL | Status: DC
  Administered 2016-08-26 – 2016-09-07 (×9): 40 mg via ORAL
  Filled 2016-08-26 (×13): qty 1

## 2016-08-26 MED ORDER — POTASSIUM CHLORIDE CRYS ER 20 MEQ PO TBCR
40.0000 meq | EXTENDED_RELEASE_TABLET | Freq: Two times a day (BID) | ORAL | Status: DC
  Administered 2016-08-26 – 2016-08-31 (×10): 40 meq via ORAL
  Filled 2016-08-26 (×11): qty 2

## 2016-08-26 MED ORDER — FUROSEMIDE 10 MG/ML IJ SOLN
40.0000 mg | Freq: Once | INTRAMUSCULAR | Status: AC
  Administered 2016-08-26: 40 mg via INTRAVENOUS
  Filled 2016-08-26: qty 4

## 2016-08-26 MED ORDER — METOLAZONE 5 MG PO TABS
5.0000 mg | ORAL_TABLET | Freq: Two times a day (BID) | ORAL | Status: DC
  Administered 2016-08-26 – 2016-08-27 (×2): 5 mg via ORAL
  Filled 2016-08-26 (×3): qty 1

## 2016-08-26 MED ORDER — ENOXAPARIN SODIUM 40 MG/0.4ML ~~LOC~~ SOLN
40.0000 mg | SUBCUTANEOUS | Status: DC
  Filled 2016-08-26 (×10): qty 0.4

## 2016-08-26 MED ORDER — ONDANSETRON HCL 4 MG/2ML IJ SOLN
4.0000 mg | Freq: Four times a day (QID) | INTRAMUSCULAR | Status: DC | PRN
Start: 2016-08-26 — End: 2016-09-08

## 2016-08-26 MED ORDER — FUROSEMIDE 10 MG/ML IJ SOLN
80.0000 mg | Freq: Three times a day (TID) | INTRAMUSCULAR | Status: DC
  Administered 2016-08-26 – 2016-08-28 (×6): 80 mg via INTRAVENOUS
  Filled 2016-08-26 (×6): qty 8

## 2016-08-26 MED ORDER — ASPIRIN EC 81 MG PO TBEC
81.0000 mg | DELAYED_RELEASE_TABLET | Freq: Every day | ORAL | Status: DC
  Administered 2016-08-26 – 2016-09-08 (×14): 81 mg via ORAL
  Filled 2016-08-26 (×14): qty 1

## 2016-08-26 MED ORDER — SODIUM CHLORIDE 0.9% FLUSH
3.0000 mL | Freq: Two times a day (BID) | INTRAVENOUS | Status: DC
  Administered 2016-08-26 – 2016-09-07 (×18): 3 mL via INTRAVENOUS

## 2016-08-26 MED ORDER — FUROSEMIDE 10 MG/ML IJ SOLN: 80.0000 mg | Freq: Two times a day (BID) | INTRAMUSCULAR | Status: DC

## 2016-08-26 MED ORDER — INSULIN ASPART 100 UNIT/ML ~~LOC~~ SOLN
0.0000 [IU] | Freq: Three times a day (TID) | SUBCUTANEOUS | Status: DC
Start: 2016-08-26 — End: 2016-09-08
  Administered 2016-08-26: 3 [IU] via SUBCUTANEOUS
  Administered 2016-08-26: 2 [IU] via SUBCUTANEOUS
  Administered 2016-08-27: 5 [IU] via SUBCUTANEOUS
  Administered 2016-08-27: 3 [IU] via SUBCUTANEOUS
  Administered 2016-08-28: 5 [IU] via SUBCUTANEOUS
  Administered 2016-08-28: 2 [IU] via SUBCUTANEOUS
  Administered 2016-08-28: 3 [IU] via SUBCUTANEOUS
  Administered 2016-08-29: 2 [IU] via SUBCUTANEOUS
  Administered 2016-08-29 – 2016-08-30 (×4): 3 [IU] via SUBCUTANEOUS
  Administered 2016-08-30 – 2016-08-31 (×4): 5 [IU] via SUBCUTANEOUS
  Administered 2016-09-01: 3 [IU] via SUBCUTANEOUS
  Administered 2016-09-01 – 2016-09-03 (×6): 5 [IU] via SUBCUTANEOUS
  Administered 2016-09-03: 11 [IU] via SUBCUTANEOUS
  Administered 2016-09-03: 3 [IU] via SUBCUTANEOUS
  Administered 2016-09-04: 8 [IU] via SUBCUTANEOUS
  Administered 2016-09-04: 3 [IU] via SUBCUTANEOUS
  Administered 2016-09-04: 5 [IU] via SUBCUTANEOUS
  Administered 2016-09-05 (×2): 3 [IU] via SUBCUTANEOUS
  Administered 2016-09-05: 5 [IU] via SUBCUTANEOUS
  Administered 2016-09-06: 3 [IU] via SUBCUTANEOUS
  Administered 2016-09-06: 5 [IU] via SUBCUTANEOUS
  Administered 2016-09-06 – 2016-09-07 (×2): 3 [IU] via SUBCUTANEOUS
  Administered 2016-09-07: 5 [IU] via SUBCUTANEOUS
  Administered 2016-09-07: 2 [IU] via SUBCUTANEOUS
  Administered 2016-09-08: 5 [IU] via SUBCUTANEOUS
  Administered 2016-09-08: 1 [IU] via SUBCUTANEOUS
  Filled 2016-08-26: qty 1

## 2016-08-26 MED ORDER — TRAMADOL HCL 50 MG PO TABS
50.0000 mg | ORAL_TABLET | Freq: Four times a day (QID) | ORAL | Status: DC | PRN
  Administered 2016-08-27 – 2016-09-02 (×5): 50 mg via ORAL
  Filled 2016-08-26 (×5): qty 1

## 2016-08-26 NOTE — Telephone Encounter (Signed)
Alerted by  Epic that pt is currently admited/ER with CHF.  Pt has appt scheduled for follow up in the heart failure clinic today.  Called and left message that pt is in the hospital and her last exercise session attended was on on 10/2. Alanson Alyarlette Carlton RN, BSN

## 2016-08-26 NOTE — H&P (Signed)
History and Physical    [name] MRN:[id number] DOB: 09/29/1951 DOA: 08/25/2016  PCP: Duran, [name], PA-C   Patient coming from: Home.  Chief Complaint: Chest pain.   HPI: Christine Knight is a 65 y.o. female with medical history significant of CAD, NSTEMI, CABG in 08/2011, chronic combined diastolic/systolic CHF with EF of 30-35% last month, mitral regurgitation, type 2 diabetes, hyperlipidemia, hyperlipidemia, hypertension, morbid obesity, pulmonary hypertension who is coming to the ED with chest pain.   Per patient, she started having left sided CP non-radiated, pressure like and relieved by rest since yesterday. She denies dizziness, diaphoresis, palpitations, but complains of orthopnea and pitting edema lower extremities. She also mentions a 30+ pound weight gain in the past few weeks, since she was taken off of her diuretics by cardiology. She has improved her EF from 15-20% to 30-35% in the last few months with cardiac rehab. She states that she has been compliant with diet and fluid intake.   ED Course: The pattient received supplemental oxygen, aspirin 324 mg and furosemide 40 mg IVP. EKGs were of low quality, troponin level was 0.05 ng/mL, hemoglobin was 13.5 mg/dL,   Review of Systems: As per HPI otherwise 10 point review of systems negative.     Past Medical History:  Diagnosis Date  . CAD (coronary artery disease) 08/2011   NSTEMI with subsequent CABG 08/13/2011 with LIMA-LAD, SVG-diagonal, SVG-OM, SVG-PDA  . Carpal tunnel syndrome on both sides    "post OHS in 08/2011; resolved now" (03/17/2013)  . CHF (congestive heart failure) (HCC)   . Chronic systolic heart failure (HCC)    Chronic systolic CHF  . Diabetes mellitus type 2 in obese (HCC) 08/10/11  . Ejection fraction < 50%    EF 20%, October, 2012  //  EF 25% December, 2012  //   EF 35%, echo, September, 2013  . Hyperlipidemia 08/10/11  . Hypertension   . Ischemic cardiomyopathy 08/13/2011   EF 20% in  08/2011, still 25% 10/21/2011  . Mitral regurgitation    Mild by echo 10/21/11  . Myocardial infarction 08/2011  . Obesity, morbid (HCC)   . Orthopnoea    "progressively worse over last 3 wks" (03/17/2013)  . Pleural effusion    Requiring L thoracentesis 09/02/11  . Pulmonary hypertension    Echo, September, 2013, 72 mmHg.    Past Surgical History:  Procedure Laterality Date  . CARDIAC CATHETERIZATION N/A 12/13/2015   Procedure: Right/Left Heart Cath and Coronary/Graft Angiography;  Surgeon: Peter M Jordan, MD;  Location: [facility];  Service: Cardiovascular;  Laterality: N/A;  . CORONARY ARTERY BYPASS GRAFT  08/13/11   CABG x4 with LIMA to LAD, SVG to Diag, SVG to OM, SVG to PDA, EVH via both thighs  . LAPAROSCOPY  1980's?   "removed gallstones; not gallbladder" (03/17/2013)     reports that she quit smoking about 37 years ago. Her smoking use included Cigarettes. She has never used smokeless tobacco. She reports that she drinks alcohol. She reports that she does not use drugs.  Allergies  Allergen Reactions  . Lisinopril Nausea And Vomiting, Other (See Comments) and Cough    Coughing, throwing up, waking up coughing  . Metformin And Related Anaphylaxis    Family History  Problem Relation Age of Onset  . Heart disease Father   . Hypertension Maternal Grandmother   . Hypertension Sister   . Cancer Neg Hx   . Diabetes Neg Hx   . Kidney disease Neg Hx       Prior to Admission medications   Medication Sig Start Date End Date Taking? Authorizing Provider  aspirin 81 MG tablet Take 1 tablet (81 mg total) by mouth daily. 11/15/13  Yes Carlena Bjornstad, MD  gabapentin (NEURONTIN) 300 MG capsule Take 300 mg by mouth at bedtime.   Yes Historical Provider, MD  Multiple Vitamins-Minerals (CENTRUM SILVER 50+WOMEN PO) Take 1 tablet by mouth every morning.   Yes Historical Provider, MD  torsemide (DEMADEX) 20 MG tablet Take one tablet every morning of Monday Wednesday and Friday Patient  taking differently: Take 20 mg by mouth See admin instructions. Take one tablet every morning of Monday Wednesday and Friday 08/07/16  Yes Amy D Clegg, NP  traMADol (ULTRAM) 50 MG tablet Take 50 mg by mouth every 6 (six) hours as needed for moderate pain.    Yes Historical Provider, MD  Blood Glucose Monitoring Suppl (ONETOUCH VERIO) W/DEVICE KIT 1 Act by Does not apply route 3 (three) times daily. 10/12/14   Janith Lima, MD  glucose blood test strip Use TID 10/12/14   Janith Lima, MD    Physical Exam:  Constitutional: NAD, calm, comfortable Vitals:   08/25/16 2315 08/25/16 2330 08/26/16 0000 08/26/16 0030  BP: (!) 128/101 126/81 116/74 111/75  Pulse: 67 70 72 63  Resp: _0 Temp:      TempSrc:      SpO2: 94% 94% 90% 95%  Weight:      Height:       Eyes: PERRL, lids and conjunctivae normal ENMT: Mucous membranes are moist. Posterior pharynx clear of any exudate or lesions. Neck: normal, supple, no masses, no thyromegaly Respiratory: Bibasilar rales, no wheezing. Normal respiratory effort. No accessory muscle use.  Cardiovascular: Regular rate and rhythm, no murmurs / rubs / gallops. 2+ lower extremity edema. 2+ pedal pulses. No carotid bruits.  Abdomen: no tenderness, no masses palpated. No hepatosplenomegaly. Bowel sounds positive.  Musculoskeletal: no clubbing / cyanosis. No joint deformity upper and lower extremities. Good ROM, no contractures. Normal muscle tone.  Skin: no rashes, lesions, ulcers. No induration Neurologic: CN 2-12 grossly intact. Sensation intact, DTR normal. Strength 5/5 in all 4.  Psychiatric: Normal judgment and insight. Alert and oriented x 4. Normal mood.    Labs on Admission: I have personally reviewed following labs and imaging studies  CBC:  Recent Labs Lab 08/25/16 1652  WBC 5.2  HGB 13.5  HCT 40.7  MCV 93.8  PLT 354   Basic Metabolic Panel:  Recent Labs Lab 08/25/16 1652 08/25/16 2119  NA 137  --   K 4.2  --   CL 105  --     CO2 24  --   GLUCOSE 189*  --   BUN 33*  --   CREATININE 1.50*  --   CALCIUM 9.6  --   MG  --  2.2   GFR: Estimated Creatinine Clearance: 49.7 mL/min (by C-G formula based on SCr of 1.5 mg/dL (H)). Liver Function Tests:  Recent Labs Lab 08/25/16 2119  AST 44*  ALT 16  ALKPHOS 116  BILITOT 1.7*  PROT 7.7  ALBUMIN 3.9   Cardiac Enzymes:  Recent Labs Lab 08/25/16 2119  TROPONINI 0.05*   Urine analysis:    Component Value Date/Time   COLORURINE RED (A) 04/15/2016 1733   APPEARANCEUR CLOUDY (A) 04/15/2016 1733   LABSPEC 1.009 04/15/2016 1733   PHURINE 7.0 04/15/2016 1733   GLUCOSEU NEGATIVE 04/15/2016 1733   GLUCOSEU NEGATIVE 10/10/2014 1649  HGBUR LARGE (A) 04/15/2016 1733   BILIRUBINUR NEGATIVE 04/15/2016 1733   KETONESUR NEGATIVE 04/15/2016 1733   PROTEINUR NEGATIVE 04/15/2016 1733   UROBILINOGEN 1.0 05/29/2015 1414   NITRITE NEGATIVE 04/15/2016 1733   LEUKOCYTESUR SMALL (A) 04/15/2016 1733    Radiological Exams on Admission: Dg Chest 2 View  Result Date: 08/25/2016 CLINICAL DATA:  Initial evaluation for 1 week history of shortness of breath. EXAM: CHEST  2 VIEW COMPARISON:  Prior radiograph from 04/13/2016. FINDINGS: Moderate cardiomegaly, stable. Median sternotomy wires underlying CABG markers and surgical clips noted. Mediastinal silhouette within normal limits. Aortic atherosclerosis noted. Lungs are normally inflated. Vascular congestion with indistinctness of the interstitial markings, suggestive of mild diffuse pulmonary interstitial edema. Small bilateral pleural effusions. No focal infiltrates. No pneumothorax. No acute osseous abnormality. IMPRESSION: 1. Cardiomegaly with mild diffuse pulmonary interstitial edema and small bilateral pleural effusions. 2. Sequela of prior CABG. 3. Aortic atherosclerosis. Electronically Signed   By: Benjamin  McClintock M.D.   On: 08/25/2016 17:34    EKG: Independently reviewed.  EKG #1 Vent. rate 72 BPM PR interval *  ms QRS duration 100 ms QT/QTc 384/420 ms P-R-T axes * 136 -29 Atrial fibrillation with a competing junctional pacemaker Low voltage QRS Possible Lateral infarct , age undetermined ST & T wave abnormality, consider inferior ischemia Abnormal ECG  EKG #2 Vent. rate 77 BPM PR interval 172 ms QRS duration 100 ms QT/QTc 370/418 ms P-R-T axes 123 138 -30 Suspect arm lead reversal, interpretation assumes no reversal Unusual P axis, possible ectopic atrial rhythm with Premature atrial complexes and Premature ventricular complexes or Fusion complexes Right axis deviation Pulmonary disease pattern ST & T wave abnormality, consider inferior ischemia Abnormal ECG   Assessment/Plan Principal Problem:   Chest pain.   Acute on chronic combined systolic and diastolic congestive heart failure  Admit to telemetry/observation. Continue supplemental oxygen. Monitor intake and output. Daily weights, sodium and fluid restriction. Trend troponin levels. Cardiology to evaluate.  Active Problems:   Hyperlipidemia LDL goal <70 Not on a statin. Continue lifestyle modifications.    Type 2 diabetes, uncontrolled, with neuropathy (HCC) Carbohydrate modified diet.    Hypothyroidism Not currently taking levothyroxine. Check TSH.    CKD (chronic kidney disease) stage 3, GFR 30-59 ml/min Monitor BUN and creatinine.    Abnormal EKG Likely artifact and lead reversal. She has been in NSR while being interviewed and examined. Repeat EKG ordered earlier.    DVT prophylaxis: Lovenox. Code Status: Full code. Family Communication:  Disposition Plan: Admit for IV diuresis, troponin level trending and cardiology evaluation. Consults called: Cardiology was consulted by the ED and requested medicine admission. Admission status: Observation/Telemetry.   David Manuel Ortiz MD [facility] Pager [phone].  If 7PM-7AM, please contact night-coverage [email or web address] Password  [email or web address]  08/26/2016, 12:51 AM     

## 2016-08-26 NOTE — ED Notes (Signed)
Breakfast tray ordered 

## 2016-08-26 NOTE — Progress Notes (Signed)
Patient admitted after midnight, please see H&P.  >30lb weight gain over 3 weeks.  Diuretics recently changed-- had appointment today with CHF team-- will consult (spoke with Amy NP).  Suspect patient will be here > 2 midnights so will change to an admission.  Patient currently in ER awaiting tele bed  Christine CanaryJessica Dianna Deshler DO

## 2016-08-26 NOTE — Consult Note (Signed)
Advanced Heart Failure Team Consult Note  Referring Physician: Dr Eliseo Squires Primary Physician:  [facility]  Primary Cardiologist:  Dr Haroldine Laws  Reason for Consultation: Heart Failure   HPI:   [name] is a 65 y.o. female w/ PMHx significant for morbid obesity, systolic HF due to ICM (08/65 EF 25%; 07/2012 EF 35%; 03/18/13 EF 20-25%), CAD with h/o NSTEMI s/p CABG Oct 2012, pulmonary hypertension (PA peak 57mHg), and poorly controlled DM 2.  Admitted 12/08/2015 - 12/28/2015 with worsening DOE and cough. Stated she had a 40 lb weight since 07/2015. She was diuresed aggressively with lasix gtt at 30 mg/hr and metolazone 5 mg BID. Diamox eventually added. Diuretics held temporarily towards end of stay with worsening Creatinine. Overall she diuresed >40 L and was down 70 lbs from admission weight. Pt had R/LHC with preserved CO and mild/mod Pulm HTN. Severe native CAD with patent CABG grafts. Discharge weight 237 lbs.   Admitted 6/1 -04/26/2016 with marked volume overload, low output hf, cardiorenal syndrome. Diuresed with high dose lasix 160 mg three times a day. Diuresed over 50 pounds. Transitioned to torsemide 80 mg daily. BB and losartan stopped. Discharge weight 222 pounds    On 9/25 spiro, entresto, potassium , torsemide stopped due to elevated creatinine (3.18). She followed up in the HF clinic 9/27 with creatinine trending down to 2.2. She was instructed to restart torsemide 20 mg Mon-Wed Fri. Weight at that time was 253 pounds. Since that time weight has gone up to 270 pounds at home. Says she has had increased dyspnea on exertion and increased leg edema. She has not been on bidil due dizziness and hypotension.   Today she presented to the ED with increased dyspnea and leg edema. Mid sternal chest pain. CXR with edema. Received 40 mg IV lasix x1.   Pertinent admission labs: creatinine 1.2, troponin <0.03, K 3.9, hgb 12,7, BNP 1360    Review of Systems: [y] = yes, [ ] = no   General:  Weight gain [ Y]; Weight loss [ ]; Anorexia [ ]; Fatigue [Y ]; Fever [ ]; Chills [ ]; Weakness [Y ]  Cardiac: Chest pain/pressure [Jazmín.Cullens]; Resting SOB [ ]; Exertional SOB [Y ]; Orthopnea [Jazmín.Cullens]; Pedal Edema [ Y]; Palpitations [ ]; Syncope [ ]; Presyncope [ ]; Paroxysmal nocturnal dyspnea[ ]  Pulmonary: Cough [ ]; Wheezing[ ]; Hemoptysis[ ]; Sputum [ ]; Snoring [ ]  GI: Vomiting[ ]; Dysphagia[ ]; Melena[ ]; Hematochezia [ ]; Heartburn[ ]; Abdominal pain [ ]; Constipation [ ]; Diarrhea [ ]; BRBPR [ ]  GU: Hematuria[ ]; Dysuria [ ]; Nocturia[ ]  Vascular: Pain in legs with walking [ ]; Pain in feet with lying flat [ ]; Non-healing sores [ ]; Stroke [ ]; TIA [ ]; Slurred speech [ ];  Neuro: Headaches[ ]; Vertigo[ ]; Seizures[ ]; Paresthesias[ ];Blurred vision [ ]; Diplopia [ ]; Vision changes [ ]  Ortho/Skin: Arthritis [ ]; Joint pain [ Y]; Muscle pain [ ]; Joint swelling [ ]; Back Pain [Y ]; Rash [ ]  Psych: Depression[ ]; Anxiety[ ]  Heme: Bleeding problems [ ]; Clotting disorders [ ]; Anemia [ ]  Endocrine: Diabetes [ ]; Thyroid dysfunction[ ]  Home Medications Prior to Admission medications   Medication Sig Start Date End Date Taking? Authorizing Provider  aspirin 81 MG tablet Take 1 tablet (81 mg total) by mouth daily. 11/15/13  Yes JCarlena Bjornstad MD  gabapentin (NEURONTIN) 300 MG capsule Take 300 mg  by mouth at bedtime.   Yes Historical Provider, MD  Multiple Vitamins-Minerals (CENTRUM SILVER 50+WOMEN PO) Take 1 tablet by mouth every morning.   Yes Historical Provider, MD  torsemide (DEMADEX) 20 MG tablet Take one tablet every morning of Monday Wednesday and Friday Patient taking differently: Take 20 mg by mouth See admin instructions. Take one tablet every morning of Monday Wednesday and Friday 08/07/16  Yes Amy D Clegg, NP  traMADol (ULTRAM) 50 MG tablet Take 50 mg by mouth every 6 (six) hours as needed for moderate pain.    Yes Historical Provider, MD  Blood Glucose Monitoring Suppl (ONETOUCH  VERIO) W/DEVICE KIT 1 Act by Does not apply route 3 (three) times daily. 10/12/14   Janith Lima, MD  glucose blood test strip Use TID 10/12/14   Janith Lima, MD    Past Medical History: Past Medical History:  Diagnosis Date  . CAD (coronary artery disease) 08/2011   NSTEMI with subsequent CABG 08/13/2011 with LIMA-LAD, SVG-diagonal, SVG-OM, SVG-PDA  . Carpal tunnel syndrome on both sides    "post OHS in 08/2011; resolved now" (03/17/2013)  . CHF (congestive heart failure) ([place])   . Chronic systolic heart failure (HCC)    Chronic systolic CHF  . Diabetes mellitus type 2 in obese ([place]) 08/10/11  . Ejection fraction < 50%    EF 20%, October, 2012  //  EF 25% December, 2012  //   EF 35%, echo, September, 2013  . Hyperlipidemia 08/10/11  . Hypertension   . Ischemic cardiomyopathy 08/13/2011   EF 20% in 08/2011, still 25% 10/21/2011  . Mitral regurgitation    Mild by echo 10/21/11  . Myocardial infarction 08/2011  . Obesity, morbid ([place])   . Orthopnoea    "progressively worse over last 3 wks" (03/17/2013)  . Pleural effusion    Requiring L thoracentesis 09/02/11  . Pulmonary hypertension    Echo, September, 2013, 72 mmHg.    Past Surgical History: Past Surgical History:  Procedure Laterality Date  . CARDIAC CATHETERIZATION N/A 12/13/2015   Procedure: Right/Left Heart Cath and Coronary/Graft Angiography;  Surgeon: Peter M Martinique, MD;  Location: [facility];  Service: Cardiovascular;  Laterality: N/A;  . CORONARY ARTERY BYPASS GRAFT  08/13/11   CABG x4 with LIMA to LAD, SVG to Diag, SVG to OM, SVG to PDA, EVH via both thighs  . LAPAROSCOPY  1980's?   "removed gallstones; not gallbladder" (03/17/2013)    Family History: Family History  Problem Relation Age of Onset  . Heart disease Father   . Hypertension Maternal Grandmother   . Hypertension Sister   . Cancer Neg Hx   . Diabetes Neg Hx   . Kidney disease Neg Hx     Social History: Social History   Social History  .  Marital status: Divorced    Spouse name: N/A  . Number of children: 3  . Years of education: N/A   Occupational History  . [employer] teller    Social History Main Topics  . Smoking status: Former Smoker    Types: Cigarettes    Quit date: 11/11/1978  . Smokeless tobacco: Never Used     Comment: 03/17/2013 "only a social smoker when I did smoke; ever bought any"  . Alcohol use Yes     Comment: 03/17/2013 "in the last 6 months I've had 1 glass of wine"  . Drug use: No  . Sexual activity: Not Currently    Birth control/ protection: Post-menopausal  Other Topics Concern  . None   Social History Narrative   Lives with her sister and daughter.  Ambulates independently.    Allergies:  Allergies  Allergen Reactions  . Lisinopril Nausea And Vomiting, Other (See Comments) and Cough    Coughing, throwing up, waking up coughing  . Metformin And Related Anaphylaxis    Objective:    Vital Signs:   Temp:  [97.7 F (36.5 C)-98.5 F (36.9 C)] 97.7 F (36.5 C) (10/15 1943) Pulse Rate:  [51-74] 71 (10/16 0930) Resp:  [9-22] 9 (10/16 0930) BP: (102-139)/(60-101) 126/75 (10/16 0930) SpO2:  [89 %-100 %] 97 % (10/16 0930) Weight:  [268 lb 1.6 oz (121.6 kg)] 268 lb 1.6 oz (121.6 kg) (10/15 1643)    Weight change: Filed Weights   08/25/16 1643  Weight: 268 lb 1.6 oz (121.6 kg)    Intake/Output:   Intake/Output Summary (Last 24 hours) at 08/26/16 1012 Last data filed at 08/26/16 1006  Gross per 24 hour  Intake              300 ml  Output              450 ml  Net             -150 ml     Physical Exam: General:  Sitting on the side of the bed.  No resp difficulty HEENT: normal Neck: supple. JVPto ear  . Carotids 2+ bilat; no bruits. No lymphadenopathy or thryomegaly appreciated. Cor: PMI nondisplaced. Regular rate & rhythm. No rubs, gallops or murmurs. Lungs: clear Abdomen: obese, soft, nontender, nondistended. No hepatosplenomegaly. No bruits or masses. Good bowel  sounds. Extremities: no cyanosis, clubbing, rash, R and LLE 3+ edema Neuro: alert & orientedx3, cranial nerves grossly intact. moves all 4 extremities w/o difficulty. Affect pleasant  Telemetry: Sinus Brady 50s   Labs: Basic Metabolic Panel:  Recent Labs Lab 08/25/16 1652 08/25/16 2119 08/26/16 0436  NA 137  --  138  K 4.2  --  3.9  CL 105  --  105  CO2 24  --  25  GLUCOSE 189*  --  138*  BUN 33*  --  30*  CREATININE 1.50*  --  1.25*  CALCIUM 9.6  --  9.2  MG  --  2.2  --     Liver Function Tests:  Recent Labs Lab 08/25/16 2119 08/26/16 0436  AST 44* 33  ALT 16 13*  ALKPHOS 116 108  BILITOT 1.7* 1.7*  PROT 7.7 7.0  ALBUMIN 3.9 3.4*   No results for input(s): LIPASE, AMYLASE in the last 168 hours. No results for input(s): AMMONIA in the last 168 hours.  CBC:  Recent Labs Lab 08/25/16 1652 08/26/16 0436  WBC 5.2 4.2  HGB 13.5 12.7  HCT 40.7 38.6  MCV 93.8 94.8  PLT 233 222    Cardiac Enzymes:  Recent Labs Lab 08/25/16 2119 08/26/16 0313  TROPONINI 0.05* 0.05*    BNP: BNP (last 3 results)  Recent Labs  04/11/16 1929 06/05/16 1230 08/25/16 2119  BNP 1,176.5* 906.9* 1,359.9*    ProBNP (last 3 results) No results for input(s): PROBNP in the last 8760 hours.   CBG:  Recent Labs Lab 08/26/16 0915  GLUCAP 145*    Coagulation Studies: No results for input(s): LABPROT, INR in the last 72 hours.  Other results: EKG:   Imaging: Dg Chest 2 View  Result Date: 08/25/2016 CLINICAL DATA:  Initial evaluation for 1 week history of shortness  of breath. EXAM: CHEST  2 VIEW COMPARISON:  Prior radiograph from 04/13/2016. FINDINGS: Moderate cardiomegaly, stable. Median sternotomy wires underlying CABG markers and surgical clips noted. Mediastinal silhouette within normal limits. Aortic atherosclerosis noted. Lungs are normally inflated. Vascular congestion with indistinctness of the interstitial markings, suggestive of mild diffuse pulmonary  interstitial edema. Small bilateral pleural effusions. No focal infiltrates. No pneumothorax. No acute osseous abnormality. IMPRESSION: 1. Cardiomegaly with mild diffuse pulmonary interstitial edema and small bilateral pleural effusions. 2. Sequela of prior CABG. 3. Aortic atherosclerosis. Electronically Signed   By: Jeannine Boga M.D.   On: 08/25/2016 17:34      Medications:     Current Medications: . aspirin EC  81 mg Oral Daily  . enoxaparin (LOVENOX) injection  40 mg Subcutaneous Q24H  . furosemide  40 mg Intravenous Once  . furosemide  80 mg Intravenous BID  . gabapentin  300 mg Oral QHS  . insulin aspart  0-15 Units Subcutaneous TID WC  . pantoprazole  40 mg Oral Daily  . sodium chloride flush  3 mL Intravenous Q12H     Infusions:      Assessment/Plan :   1. Acute/Chronic systolic HF due to iCM EF 15-20% Echo 04/14/2016.Recent admit for low out put HF and diuresed over 50 pounds.Hospital course complicated by cardiorenal failure. ECHO 9/26 at the [facility].  EF 30-35% RV normal.  NYHA IIIb.Marked volume overload. Diurese with 80 mg IV lasix three times daily. She has not been on bb due to bradycardia. Not on entresto or spiro due to elevated creatinine. May be able to re-challenge.   -- Intolerant bidil but difficulty with dizziness and hypotension.  Hold off on PICC. May need to add inotrope if difficult to diurese.  2. CAD s/p NSTEMI and CABG 10/12  - Severe native CAD with patent grafts on LHC 12/13/15. Chest tightness on admit. No CP - Continue ASA 81 mg . Add atorvastatin 40 mg daily.  3. Morbid obesity 4. Pulmonary HTN - mild to mod RHC 12/13/15. PVR 3.24.  5. Recent AKI on CKD stage III- Creatinine on 9/25 3.1 Todays creatinine 1.1  Length of Stay: 0  [name] NP-C  08/26/2016, 10:12 AM  Advanced Heart Failure Team Pager [phone] (M-F; [place])  Please contact [facility] for night-coverage after hours (4p -7a ) and weekends on  [email or web address]  Patient seen with NP, agree with the above note.  She comes to the ER markedly volume overloaded.  Meds including diuretics cut back considerably recently due to AKI.  1. Acute on chronic systolic CHF: Ischemic cardiomyopathy.  EF 30-35% on echo from [facility] in 9/17.  On exam, she is markedly volume overloaded with > 20 lb weight gain recently.  She needs extensive diuresis.  Creatinine is 1.1.  - Start Lasix 80 mg IV + metolazone 5 mg x 1, follow response.  - If creatinine/BP remain stable, would try Bidil 1/2 tab tid tomorrow.  - No beta blocker for now with h/o bradycardia and marked volume overload.  - Hold off for now on ACEI/ARB/ARNI given recent AKI, would like to add back slowly if creatinine/BP remain stable.  - If she is difficult to diurese, place PICC for co-ox/CVP and consider addition of milrinone.  2. CAD: h/o CABG.  2/17 cath with patent grafts.  Some chest heaviness but think from volume overload.  TnI minimally elevated, no trend.  Suspect mild demand ischemia with volume overload.  3. Recent AKI:  Creatinine back to baseline. Will need to follow closely with diuresis.   Loralie Champagne 08/26/2016 10:46 AM

## 2016-08-26 NOTE — ED Notes (Signed)
Pt bathing herself at bedside.  Labored breathing on 3L.

## 2016-08-27 LAB — GLUCOSE, CAPILLARY
GLUCOSE-CAPILLARY: 202 mg/dL — AB (ref 65–99)
GLUCOSE-CAPILLARY: 148 mg/dL — AB (ref 65–99)
Glucose-Capillary: 118 mg/dL — ABNORMAL HIGH (ref 65–99)
Glucose-Capillary: 167 mg/dL — ABNORMAL HIGH (ref 65–99)

## 2016-08-27 LAB — BASIC METABOLIC PANEL
Anion gap: 7 (ref 5–15)
BUN: 26 mg/dL — AB (ref 6–20)
CHLORIDE: 102 mmol/L (ref 101–111)
CO2: 28 mmol/L (ref 22–32)
Calcium: 9 mg/dL (ref 8.9–10.3)
Creatinine, Ser: 1.22 mg/dL — ABNORMAL HIGH (ref 0.44–1.00)
GFR calc Af Amer: 53 mL/min — ABNORMAL LOW (ref >60)
GFR calc non Af Amer: 45 mL/min — ABNORMAL LOW (ref >60)
GLUCOSE: 148 mg/dL — AB (ref 65–99)
POTASSIUM: 3.6 mmol/L (ref 3.5–5.1)
Sodium: 137 mmol/L (ref 135–145)

## 2016-08-27 MED ORDER — METOLAZONE 5 MG PO TABS: 5.0000 mg | ORAL_TABLET | Freq: Two times a day (BID) | ORAL | Status: DC

## 2016-08-27 MED ORDER — GABAPENTIN 300 MG PO CAPS
300.0000 mg | ORAL_CAPSULE | Freq: Two times a day (BID) | ORAL | Status: DC
  Administered 2016-08-27 – 2016-09-08 (×23): 300 mg via ORAL
  Filled 2016-08-27 (×25): qty 1

## 2016-08-27 MED ORDER — ATORVASTATIN CALCIUM 40 MG PO TABS
40.0000 mg | ORAL_TABLET | Freq: Every day | ORAL | Status: DC
  Administered 2016-08-28 – 2016-09-07 (×11): 40 mg via ORAL
  Filled 2016-08-27 (×12): qty 1

## 2016-08-27 MED ORDER — SPIRONOLACTONE 25 MG PO TABS
12.5000 mg | ORAL_TABLET | Freq: Every day | ORAL | Status: DC
  Administered 2016-08-27 – 2016-09-02 (×7): 12.5 mg via ORAL
  Filled 2016-08-27 (×7): qty 1

## 2016-08-27 MED ORDER — METOLAZONE 5 MG PO TABS
5.0000 mg | ORAL_TABLET | Freq: Every day | ORAL | Status: DC
  Administered 2016-08-28: 5 mg via ORAL
  Filled 2016-08-27: qty 1

## 2016-08-27 NOTE — Progress Notes (Signed)
Advanced Heart Failure Rounding Note   Subjective:   Admitted with marked volume overload. Diuresing with IV lasix 80 mg three times daily. Weight trending down 3 pounds.   SOB with exertion.    Objective:   Weight Range:  Vital Signs:   Temp:  [97.6 F (36.4 C)-98.5 F (36.9 C)] 97.9 F (36.6 C) (10/17 0829) Pulse Rate:  [60-71] 65 (10/17 0829) Resp:  [9-19] 18 (10/17 0829) BP: (95-136)/(62-91) 113/89 (10/17 0829) SpO2:  [95 %-100 %] 98 % (10/17 0829) Weight:  [118.9 kg (262 lb 1.6 oz)-120.4 kg (265 lb 8 oz)] 118.9 kg (262 lb 1.6 oz) (10/17 0624) Last BM Date: 08/26/16  Weight change: Filed Weights   08/25/16 1643 08/26/16 1342 08/27/16 0624  Weight: 121.6 kg (268 lb 1.6 oz) 120.4 kg (265 lb 8 oz) 118.9 kg (262 lb 1.6 oz)    Intake/Output:   Intake/Output Summary (Last 24 hours) at 08/27/16 0843 Last data filed at 08/27/16 0624  Gross per 24 hour  Intake              843 ml  Output             2100 ml  Net            -1257 ml     Physical Exam: General:  Well appearing. No resp difficulty. Sitting on the side of the bed HEENT: normal Neck: supple. JVP to jaw . Carotids 2+ bilat; no bruits. No lymphadenopathy or thryomegaly appreciated. Cor: PMI nondisplaced. Regular rate & rhythm. No rubs, gallops or murmurs. Lungs: RML RLL LLL crackles on 2 liters.  Abdomen: soft, nontender, nondistended. No hepatosplenomegaly. No bruits or masses. Good bowel sounds. Extremities: no cyanosis, clubbing, rash, R and LLE 3+ edema Neuro: alert & orientedx3, cranial nerves grossly intact. moves all 4 extremities w/o difficulty. Affect pleasant  Telemetry: Sinus Rhythm 90s   Labs: Basic Metabolic Panel:  Recent Labs Lab 08/25/16 1652 08/25/16 2119 08/26/16 0436 08/27/16 0323  NA 137  --  138 137  K 4.2  --  3.9 3.6  CL 105  --  105 102  CO2 24  --  25 28  GLUCOSE 189*  --  138* 148*  BUN 33*  --  30* 26*  CREATININE 1.50*  --  1.25* 1.22*  CALCIUM 9.6  --  9.2 9.0   MG  --  2.2  --   --     Liver Function Tests:  Recent Labs Lab 08/25/16 2119 08/26/16 0436  AST 44* 33  ALT 16 13*  ALKPHOS 116 108  BILITOT 1.7* 1.7*  PROT 7.7 7.0  ALBUMIN 3.9 3.4*   No results for input(s): LIPASE, AMYLASE in the last 168 hours. No results for input(s): AMMONIA in the last 168 hours.  CBC:  Recent Labs Lab 08/25/16 1652 08/26/16 0436  WBC 5.2 4.2  HGB 13.5 12.7  HCT 40.7 38.6  MCV 93.8 94.8  PLT 233 222    Cardiac Enzymes:  Recent Labs Lab 08/25/16 2119 08/26/16 0313 08/26/16 0919 08/26/16 1615  TROPONINI 0.05* 0.05* 0.04* 0.04*    BNP: BNP (last 3 results)  Recent Labs  04/11/16 1929 06/05/16 1230 08/25/16 2119  BNP 1,176.5* 906.9* 1,359.9*    ProBNP (last 3 results) No results for input(s): PROBNP in the last 8760 hours.    Other results:  Imaging: Dg Chest 2 View  Result Date: 08/25/2016 CLINICAL DATA:  Initial evaluation for 1 week history of shortness  of breath. EXAM: CHEST  2 VIEW COMPARISON:  Prior radiograph from 04/13/2016. FINDINGS: Moderate cardiomegaly, stable. Median sternotomy wires underlying CABG markers and surgical clips noted. Mediastinal silhouette within normal limits. Aortic atherosclerosis noted. Lungs are normally inflated. Vascular congestion with indistinctness of the interstitial markings, suggestive of mild diffuse pulmonary interstitial edema. Small bilateral pleural effusions. No focal infiltrates. No pneumothorax. No acute osseous abnormality. IMPRESSION: 1. Cardiomegaly with mild diffuse pulmonary interstitial edema and small bilateral pleural effusions. 2. Sequela of prior CABG. 3. Aortic atherosclerosis. Electronically Signed   By: Rise Mu M.D.   On: 08/25/2016 17:34      Medications:     Scheduled Medications: . aspirin EC  81 mg Oral Daily  . atorvastatin  40 mg Oral q1800  . enoxaparin (LOVENOX) injection  40 mg Subcutaneous Q24H  . furosemide  80 mg Intravenous TID    . gabapentin  300 mg Oral QHS  . insulin aspart  0-15 Units Subcutaneous TID WC  . metolazone  5 mg Oral BID  . pantoprazole  40 mg Oral Daily  . potassium chloride  40 mEq Oral BID  . sodium chloride flush  3 mL Intravenous Q12H     Infusions:     PRN Medications:  acetaminophen, ondansetron (ZOFRAN) IV, traMADol   Assessment/Plan/Discussion:   1. Acute/Chronic systolic HF due to iCM EF 15-20% Echo 04/14/2016.Recent admit for low out put HF and diuresed over 50 pounds.Hospital course complicated by cardiorenal failure. ECHO 9/26 at [facility].  EF 30-35% RV normal.  NYHA IIIb.Marked volume overload. Continue 80 mg IV lasix three times daily and metolazone 5 mg daily. In the past she has required short term inotropes. Hold off for now. .  She has not been on bb due to bradycardia.  Not on entresto or spiro due to elevated creatinine. Today will re-challenge with 12.5 mg spiro daily.  -- Intolerant bidil but difficulty with dizziness and hypotension.  Hold off on PICC. Repeat ECHO Wrap legs Ace Wraps.  2. CAD s/p NSTEMI and CABG 10/12  - Severe native CAD with patent grafts on LHC 12/13/15. Chest tightness on admit. No CP - Continue ASA 81 mg . Continue atorvastatin 40 mg daily.  3. Morbid obesity 4. Pulmonary HTN - mild to mod RHC 12/13/15. PVR 3.24.  5. Recent AKI on CKD stage III- Creatinine on 9/25 3.1.  Todays creatinine 1.2  Length of Stay: 1  [name] NP-C  08/27/2016, 8:43 AM  Advanced Heart Failure Team Pager [phone] (M-F; 7a - 4p)  Please contact [facility] for night-coverage after hours (4p -7a ) and weekends on [email or web address]  Patient seen and examined with Tonye Becket, NP. We discussed all aspects of the encounter. I agree with the assessment and plan as stated above.   Continues with marked volume overload. Diuresing sluggishly. Will continue IV lasix 80 tid and metolazone. Low threshold to add milrinone if diuresis remains sluggish or creatinine  bumps.   Maira Christon,MD 2:51 PM

## 2016-08-27 NOTE — Care Management Note (Signed)
Case Management Note  Patient Details  Name: Lum BabeBarbara Demasi MRN: [id number] Date of Birth: [birth year]  Subjective/Objective:      Admitted with CHF              Action/Plan: Patient lives at home with her sister; PCP is Dr Arnette FeltsMike Duran; has private insurance with Medicare  And [employer]; [facility]; She continues to drive and does not have any DME. She eats a heart health diet low in sodium.  Expected Discharge Date:     Possible 08/31/2016             Expected Discharge Plan:  Home/Self Care  Discharge planning Services  CM Consult  Status of Service:  In process, will continue to follow  Reola MosherChandler, Vanisha Whiten L, RN,MHA,BSN [phone] 08/27/2016, 10:46 AM

## 2016-08-27 NOTE — Progress Notes (Signed)
PROGRESS NOTE    Christine Knight  VHQ:[id number] DOB: 06-14-1951 DOA: 08/25/2016 PCP: Roxanne Mins, PA-C   Outpatient Specialists:    Brief Narrative:  Christine Knight is a 65 y.o. female with medical history significant of CAD, NSTEMI, CABG in 08/2011, chronic combined diastolic/systolic CHF with EF of 30-35% last month, mitral regurgitation, type 2 diabetes, hyperlipidemia, hyperlipidemia, hypertension, morbid obesity, pulmonary hypertension who is coming to the ED with chest pain.   Per patient, she started having left sided CP non-radiated, pressure like and relieved by rest since yesterday. She denies dizziness, diaphoresis, palpitations, but complains of orthopnea and pitting edema lower extremities. She also mentions a 30+ pound weight gain in the past few weeks, since she was taken off of her diuretics by cardiology. She has improved her EF from 15-20% to 30-35% in the last few months with cardiac rehab. She states that she has been compliant with diet and fluid intake.   Assessment & Plan:   Principal Problem:   Acute on chronic combined systolic and diastolic congestive heart failure (HCC) Active Problems:   Hyperlipidemia LDL goal <70   Type 2 diabetes, uncontrolled, with neuropathy (HCC)   Hypothyroidism   CKD (chronic kidney disease) stage 3, GFR 30-59 ml/min   CHF (congestive heart failure) (HCC)   Acute on chronic combined systolic and diastolic congestive heart failure  -appreciate CHF team -IV lasix/spiro/metolazone-- down 3 lbs -? Need for inotropes -watch Cr-- stable for now    Hyperlipidemia LDL goal <70 Not on a statin. Continue lifestyle modifications.    Type 2 diabetes, uncontrolled, with neuropathy (HCC) Carbohydrate modified diet. SSI    Hypothyroidism Not currently taking levothyroxine. TSH ok    CKD (chronic kidney disease) stage 3, GFR 30-59 ml/min Monitor BUN and creatinine.  Morbid obesity -encourage weight loss -going to  cardiac rehab    DVT prophylaxis:  Lovenox   Code Status: Full Code   Family Communication:   Disposition Plan:     Consultants:   CHF     Subjective: Breathing better No chest pain  Objective: Vitals:   08/26/16 2114 08/27/16 0053 08/27/16 0624 08/27/16 0829  BP: 136/78 95/62 112/70 113/89  Pulse: 70 69 60 65  Resp: 18 18 18 18   Temp: 97.7 F (36.5 C) 98.5 F (36.9 C) 97.6 F (36.4 C) 97.9 F (36.6 C)  TempSrc: Oral Oral Oral Oral  SpO2: 100% 100% 100% 98%  Weight:   118.9 kg (262 lb 1.6 oz)   Height:        Intake/Output Summary (Last 24 hours) at 08/27/16 1034 Last data filed at 08/27/16 0624  Gross per 24 hour  Intake              543 ml  Output             2100 ml  Net            -1557 ml   Filed Weights   08/25/16 1643 08/26/16 1342 08/27/16 0624  Weight: 121.6 kg (268 lb 1.6 oz) 120.4 kg (265 lb 8 oz) 118.9 kg (262 lb 1.6 oz)    Examination:  General exam: Appears calm and comfortable  Respiratory system: Clear to auscultation. Respiratory effort normal. Cardiovascular system: S1 & S2 heard, RRR. No JVD, murmurs, rubs, gallops or clicks. + edema. Gastrointestinal system: Abdomen isobese, soft and nontender. No organomegaly or masses felt. Normal bowel sounds heard. Central nervous system: Alert and oriented. No focal neurological deficits.    Data  Reviewed: I have personally reviewed following labs and imaging studies  CBC:  Recent Labs Lab 08/25/16 1652 08/26/16 0436  WBC 5.2 4.2  HGB 13.5 12.7  HCT 40.7 38.6  MCV 93.8 94.8  PLT 233 222   Basic Metabolic Panel:  Recent Labs Lab 08/25/16 1652 08/25/16 2119 08/26/16 0436 08/27/16 0323  NA 137  --  138 137  K 4.2  --  3.9 3.6  CL 105  --  105 102  CO2 24  --  25 28  GLUCOSE 189*  --  138* 148*  BUN 33*  --  30* 26*  CREATININE 1.50*  --  1.25* 1.22*  CALCIUM 9.6  --  9.2 9.0  MG  --  2.2  --   --    GFR: Estimated Creatinine Clearance: 60.3 mL/min (by C-G  formula based on SCr of 1.22 mg/dL (H)). Liver Function Tests:  Recent Labs Lab 08/25/16 2119 08/26/16 0436  AST 44* 33  ALT 16 13*  ALKPHOS 116 108  BILITOT 1.7* 1.7*  PROT 7.7 7.0  ALBUMIN 3.9 3.4*   No results for input(s): LIPASE, AMYLASE in the last 168 hours. No results for input(s): AMMONIA in the last 168 hours. Coagulation Profile: No results for input(s): INR, PROTIME in the last 168 hours. Cardiac Enzymes:  Recent Labs Lab 08/25/16 2119 08/26/16 0313 08/26/16 0919 08/26/16 1615  TROPONINI 0.05* 0.05* 0.04* 0.04*   BNP (last 3 results) No results for input(s): PROBNP in the last 8760 hours. HbA1C: No results for input(s): HGBA1C in the last 72 hours. CBG:  Recent Labs Lab 08/26/16 0915 08/26/16 1242 08/26/16 1623 08/26/16 2111 08/27/16 0621  GLUCAP 145* 159* 106* 123* 118*   Lipid Profile: No results for input(s): CHOL, HDL, LDLCALC, TRIG, CHOLHDL, LDLDIRECT in the last 72 hours. Thyroid Function Tests:  Recent Labs  08/26/16 0449  TSH 3.362   Anemia Panel: No results for input(s): VITAMINB12, FOLATE, FERRITIN, TIBC, IRON, RETICCTPCT in the last 72 hours. Urine analysis:    Component Value Date/Time   COLORURINE RED (A) 04/15/2016 1733   APPEARANCEUR CLOUDY (A) 04/15/2016 1733   LABSPEC 1.009 04/15/2016 1733   PHURINE 7.0 04/15/2016 1733   GLUCOSEU NEGATIVE 04/15/2016 1733   GLUCOSEU NEGATIVE 10/10/2014 1649   HGBUR LARGE (A) 04/15/2016 1733   BILIRUBINUR NEGATIVE 04/15/2016 1733   KETONESUR NEGATIVE 04/15/2016 1733   PROTEINUR NEGATIVE 04/15/2016 1733   UROBILINOGEN 1.0 05/29/2015 1414   NITRITE NEGATIVE 04/15/2016 1733   LEUKOCYTESUR SMALL (A) 04/15/2016 1733    )No results found for this or any previous visit (from the past 240 hour(s)).    Anti-infectives    None       Radiology Studies: Dg Chest 2 View  Result Date: 08/25/2016 CLINICAL DATA:  Initial evaluation for 1 week history of shortness of breath. EXAM: CHEST  2  VIEW COMPARISON:  Prior radiograph from 04/13/2016. FINDINGS: Moderate cardiomegaly, stable. Median sternotomy wires underlying CABG markers and surgical clips noted. Mediastinal silhouette within normal limits. Aortic atherosclerosis noted. Lungs are normally inflated. Vascular congestion with indistinctness of the interstitial markings, suggestive of mild diffuse pulmonary interstitial edema. Small bilateral pleural effusions. No focal infiltrates. No pneumothorax. No acute osseous abnormality. IMPRESSION: 1. Cardiomegaly with mild diffuse pulmonary interstitial edema and small bilateral pleural effusions. 2. Sequela of prior CABG. 3. Aortic atherosclerosis. Electronically Signed   By: Rise MuBenjamin  McClintock M.D.   On: 08/25/2016 17:34        Scheduled Meds: . aspirin EC  81 mg Oral Daily  . atorvastatin  40 mg Oral q1800  . enoxaparin (LOVENOX) injection  40 mg Subcutaneous Q24H  . furosemide  80 mg Intravenous TID  . gabapentin  300 mg Oral QHS  . insulin aspart  0-15 Units Subcutaneous TID WC  . [START ON 08/28/2016] metolazone  5 mg Oral Daily  . pantoprazole  40 mg Oral Daily  . potassium chloride  40 mEq Oral BID  . sodium chloride flush  3 mL Intravenous Q12H  . spironolactone  12.5 mg Oral Daily   Continuous Infusions:    LOS: 1 day    Time spent: 25 min    Torrion Witter Juanetta Gosling, DO [facility] Pager [phone]  If 7PM-7AM, please contact night-coverage [email or web address] Password [email or web address] 08/27/2016, 10:34 AM

## 2016-08-27 NOTE — Progress Notes (Signed)
Pharmacist Heart Failure Core Measure Documentation  Assessment: Christine BabeBarbara Knight has an EF documented as 30-35% on 9/17 by [facility] .  Rationale: Heart failure patients with left ventricular systolic dysfunction (LVSD) and an EF < 40% should be prescribed an angiotensin converting enzyme inhibitor (ACEI) or angiotensin receptor blocker (ARB) at discharge unless a contraindication is documented in the medical record.  This patient is not currently on an ACEI or ARB for HF.  This note is being placed in the record in order to provide documentation that a contraindication to the use of these agents is present for this encounter.  ACE Inhibitor or Angiotensin Receptor Blocker is contraindicated (specify all that apply)  []   ACEI allergy AND ARB allergy []   Angioedema []   Moderate or severe aortic stenosis []   Hyperkalemia []   Hypotension []   Renal artery stenosis [x]   Worsening renal function, preexisting renal disease or dysfunction   Harland Germanndrew Jaise Moser, Pharm D 08/27/2016 10:58 PM

## 2016-08-28 ENCOUNTER — Encounter (HOSPITAL_COMMUNITY): Payer: Medicare Other

## 2016-08-28 LAB — CBC
HCT: 37.6 % (ref 36.0–46.0)
HEMOGLOBIN: 12.3 g/dL (ref 12.0–15.0)
MCH: 31 pg (ref 26.0–34.0)
MCHC: 32.7 g/dL (ref 30.0–36.0)
MCV: 94.7 fL (ref 78.0–100.0)
PLATELETS: 194 10*3/uL (ref 150–400)
RBC: 3.97 MIL/uL (ref 3.87–5.11)
RDW: 15.9 % — ABNORMAL HIGH (ref 11.5–15.5)
WBC: 3.9 10*3/uL — ABNORMAL LOW (ref 4.0–10.5)

## 2016-08-28 LAB — BASIC METABOLIC PANEL
ANION GAP: 10 (ref 5–15)
BUN: 25 mg/dL — AB (ref 6–20)
CALCIUM: 9.2 mg/dL (ref 8.9–10.3)
CO2: 29 mmol/L (ref 22–32)
Chloride: 96 mmol/L — ABNORMAL LOW (ref 101–111)
Creatinine, Ser: 1.35 mg/dL — ABNORMAL HIGH (ref 0.44–1.00)
GFR calc Af Amer: 47 mL/min — ABNORMAL LOW (ref >60)
GFR calc non Af Amer: 40 mL/min — ABNORMAL LOW (ref >60)
GLUCOSE: 162 mg/dL — AB (ref 65–99)
Potassium: 3.7 mmol/L (ref 3.5–5.1)
Sodium: 135 mmol/L (ref 135–145)

## 2016-08-28 LAB — MAGNESIUM: Magnesium: 1.9 mg/dL (ref 1.7–2.4)

## 2016-08-28 LAB — GLUCOSE, CAPILLARY
GLUCOSE-CAPILLARY: 190 mg/dL — AB (ref 65–99)
GLUCOSE-CAPILLARY: 227 mg/dL — AB (ref 65–99)
GLUCOSE-CAPILLARY: 149 mg/dL — AB (ref 65–99)
Glucose-Capillary: 136 mg/dL — ABNORMAL HIGH (ref 65–99)

## 2016-08-28 MED ORDER — METOLAZONE 5 MG PO TABS
5.0000 mg | ORAL_TABLET | Freq: Two times a day (BID) | ORAL | Status: DC
  Administered 2016-08-28 – 2016-09-04 (×15): 5 mg via ORAL
  Filled 2016-08-28 (×15): qty 1

## 2016-08-28 MED ORDER — MILRINONE LACTATE IN DEXTROSE 20-5 MG/100ML-% IV SOLN
0.1250 ug/kg/min | INTRAVENOUS | Status: DC
  Administered 2016-08-28 – 2016-09-04 (×7): 0.125 ug/kg/min via INTRAVENOUS
  Filled 2016-08-28 (×9): qty 100

## 2016-08-28 MED ORDER — DEXTROSE 5 % IV SOLN
120.0000 mg | Freq: Three times a day (TID) | INTRAVENOUS | Status: DC
  Administered 2016-08-28 – 2016-08-31 (×9): 120 mg via INTRAVENOUS
  Filled 2016-08-28 (×12): qty 12

## 2016-08-28 NOTE — Progress Notes (Signed)
PROGRESS NOTE    Christine Knight  ZOX:[id number] DOB: 03/10/1951 DOA: 08/25/2016 PCP: Roxanne Mins, PA-C   Outpatient Specialists:    Brief Narrative:  Christine Knight is a 65 y.o. female with medical history significant of CAD, NSTEMI, CABG in 08/2011, chronic combined diastolic/systolic CHF with EF of 30-35% last month, mitral regurgitation, type 2 diabetes, hyperlipidemia, hyperlipidemia, hypertension, morbid obesity, pulmonary hypertension who is coming to the ED with chest pain.   Per patient, she started having left sided CP non-radiated, pressure like and relieved by rest since yesterday. She denies dizziness, diaphoresis, palpitations, but complains of orthopnea and pitting edema lower extremities. She also mentions a 30+ pound weight gain in the past few weeks, since she was taken off of her diuretics by cardiology. She has improved her EF from 15-20% to 30-35% in the last few months with cardiac rehab. She states that she has been compliant with diet and fluid intake.   Assessment & Plan:   Principal Problem:   Acute on chronic combined systolic and diastolic congestive heart failure (HCC) Active Problems:   Hyperlipidemia LDL goal <70   Type 2 diabetes, uncontrolled, with neuropathy (HCC)   Hypothyroidism   CKD (chronic kidney disease) stage 3, GFR 30-59 ml/min   CHF (congestive heart failure) (HCC)   Acute on chronic combined systolic and diastolic congestive heart failure  -appreciate CHF team -IV lasix/spiro/metolazone-- down 7 lbs (2.2L) -? Need for inotropes -watch Cr-- slight increase    Hyperlipidemia LDL goal <70 Not on a statin. Continue lifestyle modifications.    Type 2 diabetes, uncontrolled, with neuropathy (HCC) Carbohydrate modified diet. SSI    Hypothyroidism Not currently taking levothyroxine. TSH ok    CKD (chronic kidney disease) stage 3, GFR 30-59 ml/min Monitor BUN and creatinine.  Morbid obesity -encourage weight  loss -going to cardiac rehab    DVT prophylaxis:  Lovenox   Code Status: Full Code   Family Communication: patient  Disposition Plan:     Consultants:   CHF     Subjective: Up in chair SOB improved but still gets winded  Objective: Vitals:   08/27/16 1653 08/27/16 2016 08/27/16 2355 08/28/16 0501  BP: 105/67 108/68 117/69 113/67  Pulse: 63 71 70 73  Resp: 18 18 18 20   Temp: 98.2 F (36.8 C) 97.9 F (36.6 C) 97.7 F (36.5 C) 98.3 F (36.8 C)  TempSrc: Oral Oral Oral Oral  SpO2: 100% 100% 100% 100%  Weight:    118.2 kg (260 lb 9.6 oz)  Height:        Intake/Output Summary (Last 24 hours) at 08/28/16 0820 Last data filed at 08/27/16 2058  Gross per 24 hour  Intake              720 ml  Output             1250 ml  Net             -530 ml   Filed Weights   08/26/16 1342 08/27/16 0624 08/28/16 0501  Weight: 120.4 kg (265 lb 8 oz) 118.9 kg (262 lb 1.6 oz) 118.2 kg (260 lb 9.6 oz)    Examination:  General exam: Appears calm and comfortable  Respiratory system: Coarse breath sounds posterior Cardiovascular system: S1 & S2 heard, RRR. No JVD, murmurs, rubs, gallops or clicks. + edema (lessened in LE) Gastrointestinal system: Abdomen is obese, soft and nontender. No organomegaly or masses felt. Normal bowel sounds heard. Central nervous system: Alert and oriented. No  focal neurological deficits.    Data Reviewed: I have personally reviewed following labs and imaging studies  CBC:  Recent Labs Lab 08/25/16 1652 08/26/16 0436 08/28/16 0540  WBC 5.2 4.2 3.9*  HGB 13.5 12.7 12.3  HCT 40.7 38.6 37.6  MCV 93.8 94.8 94.7  PLT 233 222 194   Basic Metabolic Panel:  Recent Labs Lab 08/25/16 1652 08/25/16 2119 08/26/16 0436 08/27/16 0323 08/28/16 0540  NA 137  --  138 137 135  K 4.2  --  3.9 3.6 3.7  CL 105  --  105 102 96*  CO2 24  --  25 28 29   GLUCOSE 189*  --  138* 148* 162*  BUN 33*  --  30* 26* 25*  CREATININE 1.50*  --  1.25* 1.22*  1.35*  CALCIUM 9.6  --  9.2 9.0 9.2  MG  --  2.2  --   --  1.9   GFR: Estimated Creatinine Clearance: 54.4 mL/min (by C-G formula based on SCr of 1.35 mg/dL (H)). Liver Function Tests:  Recent Labs Lab 08/25/16 2119 08/26/16 0436  AST 44* 33  ALT 16 13*  ALKPHOS 116 108  BILITOT 1.7* 1.7*  PROT 7.7 7.0  ALBUMIN 3.9 3.4*   No results for input(s): LIPASE, AMYLASE in the last 168 hours. No results for input(s): AMMONIA in the last 168 hours. Coagulation Profile: No results for input(s): INR, PROTIME in the last 168 hours. Cardiac Enzymes:  Recent Labs Lab 08/25/16 2119 08/26/16 0313 08/26/16 0919 08/26/16 1615  TROPONINI 0.05* 0.05* 0.04* 0.04*   BNP (last 3 results) No results for input(s): PROBNP in the last 8760 hours. HbA1C: No results for input(s): HGBA1C in the last 72 hours. CBG:  Recent Labs Lab 08/26/16 2111 08/27/16 0621 08/27/16 1227 08/27/16 1652 08/27/16 2144  GLUCAP 123* 118* 167* 202* 148*   Lipid Profile: No results for input(s): CHOL, HDL, LDLCALC, TRIG, CHOLHDL, LDLDIRECT in the last 72 hours. Thyroid Function Tests:  Recent Labs  08/26/16 0449  TSH 3.362   Anemia Panel: No results for input(s): VITAMINB12, FOLATE, FERRITIN, TIBC, IRON, RETICCTPCT in the last 72 hours. Urine analysis:    Component Value Date/Time   COLORURINE RED (A) 04/15/2016 1733   APPEARANCEUR CLOUDY (A) 04/15/2016 1733   LABSPEC 1.009 04/15/2016 1733   PHURINE 7.0 04/15/2016 1733   GLUCOSEU NEGATIVE 04/15/2016 1733   GLUCOSEU NEGATIVE 10/10/2014 1649   HGBUR LARGE (A) 04/15/2016 1733   BILIRUBINUR NEGATIVE 04/15/2016 1733   KETONESUR NEGATIVE 04/15/2016 1733   PROTEINUR NEGATIVE 04/15/2016 1733   UROBILINOGEN 1.0 05/29/2015 1414   NITRITE NEGATIVE 04/15/2016 1733   LEUKOCYTESUR SMALL (A) 04/15/2016 1733    )No results found for this or any previous visit (from the past 240 hour(s)).    Anti-infectives    None       Radiology Studies: No  results found.      Scheduled Meds: . aspirin EC  81 mg Oral Daily  . atorvastatin  40 mg Oral q1800  . enoxaparin (LOVENOX) injection  40 mg Subcutaneous Q24H  . furosemide  80 mg Intravenous TID  . gabapentin  300 mg Oral BID  . insulin aspart  0-15 Units Subcutaneous TID WC  . metolazone  5 mg Oral Daily  . pantoprazole  40 mg Oral Daily  . potassium chloride  40 mEq Oral BID  . sodium chloride flush  3 mL Intravenous Q12H  . spironolactone  12.5 mg Oral Daily   Continuous Infusions:  LOS: 2 days    Time spent: 25 min    Tayja Manzer Juanetta Gosling, DO [facility] Pager [phone]  If 7PM-7AM, please contact night-coverage [email or web address] Password [email or web address] 08/28/2016, 8:20 AM

## 2016-08-28 NOTE — Progress Notes (Signed)
Advanced Heart Failure Rounding Note   Subjective:   Admitted with marked volume overload. Diuresing with IV lasix 80 mg three times daily + 5 mg metolazone. Weight down 2 pounds.    Denies Sob at rest.     Objective:   Weight Range:  Vital Signs:   Temp:  [97.4 F (36.3 C)-98.3 F (36.8 C)] 97.4 F (36.3 C) (10/18 1146) Pulse Rate:  [63-73] 69 (10/18 1146) Resp:  [16-20] 16 (10/18 1146) BP: (105-117)/(63-78) 115/78 (10/18 1146) SpO2:  [99 %-100 %] 100 % (10/18 1146) Weight:  [260 lb 9.6 oz (118.2 kg)] 260 lb 9.6 oz (118.2 kg) (10/18 0501) Last BM Date: 08/26/16  Weight change: Filed Weights   08/26/16 1342 08/27/16 0624 08/28/16 0501  Weight: 265 lb 8 oz (120.4 kg) 262 lb 1.6 oz (118.9 kg) 260 lb 9.6 oz (118.2 kg)    Intake/Output:   Intake/Output Summary (Last 24 hours) at 08/28/16 1207 Last data filed at 08/28/16 1148  Gross per 24 hour  Intake              840 ml  Output             1150 ml  Net             -310 ml     Physical Exam: General:  No resp difficulty. Sitting in the chair.  HEENT: normal Neck: supple. JVP to jaw . Carotids 2+ bilat; no bruits. No lymphadenopathy or thryomegaly appreciated. Cor: PMI nondisplaced. Regular rate & rhythm. No rubs, gallops or murmurs. Lungs: RML RLL LLL crackles on 2 liters.  Abdomen: soft, nontender, nondistended. No hepatosplenomegaly. No bruits or masses. Good bowel sounds. Extremities: no cyanosis, clubbing, rash, R and LLE ace wraps. 3+ edema Neuro: alert & orientedx3, cranial nerves grossly intact. moves all 4 extremities w/o difficulty. Affect pleasant  Telemetry: Sinus Rhythm 90s   Labs: Basic Metabolic Panel:  Recent Labs Lab 08/25/16 1652 08/25/16 2119 08/26/16 0436 08/27/16 0323 08/28/16 0540  NA 137  --  138 137 135  K 4.2  --  3.9 3.6 3.7  CL 105  --  105 102 96*  CO2 24  --  25 28 29   GLUCOSE 189*  --  138* 148* 162*  BUN 33*  --  30* 26* 25*  CREATININE 1.50*  --  1.25* 1.22* 1.35*    CALCIUM 9.6  --  9.2 9.0 9.2  MG  --  2.2  --   --  1.9    Liver Function Tests:  Recent Labs Lab 08/25/16 2119 08/26/16 0436  AST 44* 33  ALT 16 13*  ALKPHOS 116 108  BILITOT 1.7* 1.7*  PROT 7.7 7.0  ALBUMIN 3.9 3.4*   No results for input(s): LIPASE, AMYLASE in the last 168 hours. No results for input(s): AMMONIA in the last 168 hours.  CBC:  Recent Labs Lab 08/25/16 1652 08/26/16 0436 08/28/16 0540  WBC 5.2 4.2 3.9*  HGB 13.5 12.7 12.3  HCT 40.7 38.6 37.6  MCV 93.8 94.8 94.7  PLT 233 222 194    Cardiac Enzymes:  Recent Labs Lab 08/25/16 2119 08/26/16 0313 08/26/16 0919 08/26/16 1615  TROPONINI 0.05* 0.05* 0.04* 0.04*    BNP: BNP (last 3 results)  Recent Labs  04/11/16 1929 06/05/16 1230 08/25/16 2119  BNP 1,176.5* 906.9* 1,359.9*    ProBNP (last 3 results) No results for input(s): PROBNP in the last 8760 hours.    Other results:  Imaging:  No results found.   Medications:     Scheduled Medications: . aspirin EC  81 mg Oral Daily  . atorvastatin  40 mg Oral q1800  . enoxaparin (LOVENOX) injection  40 mg Subcutaneous Q24H  . furosemide  80 mg Intravenous TID  . gabapentin  300 mg Oral BID  . insulin aspart  0-15 Units Subcutaneous TID WC  . metolazone  5 mg Oral Daily  . pantoprazole  40 mg Oral Daily  . potassium chloride  40 mEq Oral BID  . sodium chloride flush  3 mL Intravenous Q12H  . spironolactone  12.5 mg Oral Daily    Infusions:    PRN Medications: acetaminophen, ondansetron (ZOFRAN) IV, traMADol   Assessment/Plan/Discussion:   1. Acute/Chronic systolic HF due to iCM EF 15-20% Echo 04/14/2016.Recent admit for low out put HF and diuresed over 50 pounds.Hospital course complicated by cardiorenal failure. ECHO 9/26 at the [facility].  EF 30-35% RV normal.  NYHA IIIb.Marked volume overload. Increase lasix to 120 mg tid + 5 mg metolazone twice daily. In the past she has required short term inotropes. Hold  off for now. .  She has not been on bb due to bradycardia.  On admit she was not on entresto or spiro due to elevated creatinine. Continue  12.5 mg spiro daily.  -- Intolerant bidil but difficulty with dizziness and hypotension.   Repeat ECHO pending.  Wrap legs Ace Wraps.  2. CAD s/p NSTEMI and CABG 10/12  - Severe native CAD with patent grafts on LHC 12/13/15. Chest tightness on admit. No CP - Continue ASA 81 mg . Continue atorvastatin 40 mg daily.  3. Morbid obesity 4. Pulmonary HTN - mild to mod RHC 12/13/15. PVR 3.24.  5. Recent AKI on CKD stage III- Creatinine on 9/25 3.1.  Todays creatinine 1.3  Length of Stay: 2  [name] NP-C  08/28/2016, 12:07 PM  Advanced Heart Failure Team Pager [phone] (M-F; 7a - 4p)  Please contact [facility] for night-coverage after hours (4p -7a ) and weekends on [email or web address]  Patient seen and examined with Tonye Becket, NP. We discussed all aspects of the encounter. I agree with the assessment and plan as stated above.   She is diuresing sluggishly, Still with marked volume overload. WIll increase lasix and metolazone. During last admission required inotropic support. Will start milrinone to facilitate diuresis. Renal function stable so far.   Shaneice Barsanti,MD 11:48 PM

## 2016-08-29 LAB — BASIC METABOLIC PANEL
ANION GAP: 10 (ref 5–15)
BUN: 25 mg/dL — AB (ref 6–20)
CHLORIDE: 95 mmol/L — AB (ref 101–111)
CO2: 31 mmol/L (ref 22–32)
Calcium: 9.4 mg/dL (ref 8.9–10.3)
Creatinine, Ser: 1.26 mg/dL — ABNORMAL HIGH (ref 0.44–1.00)
GFR calc Af Amer: 51 mL/min — ABNORMAL LOW (ref >60)
GFR calc non Af Amer: 44 mL/min — ABNORMAL LOW (ref >60)
GLUCOSE: 125 mg/dL — AB (ref 65–99)
POTASSIUM: 3.4 mmol/L — AB (ref 3.5–5.1)
SODIUM: 136 mmol/L (ref 135–145)

## 2016-08-29 LAB — GLUCOSE, CAPILLARY
GLUCOSE-CAPILLARY: 132 mg/dL — AB (ref 65–99)
GLUCOSE-CAPILLARY: 175 mg/dL — AB (ref 65–99)
GLUCOSE-CAPILLARY: 179 mg/dL — AB (ref 65–99)

## 2016-08-29 NOTE — Progress Notes (Signed)
PROGRESS NOTE    Christine Knight  ZOX:[id number] DOB: [birth year] DOA: 08/25/2016 PCP: Roxanne Mins, Christine-C   Chief Complaint  Patient presents with  . Chest Pain     Brief Narrative:  Rosina Mininallis a 65 y.o.femalewith medical history significant of CAD, NSTEMI, CABG in 08/2011, chronic combined diastolic/systolic CHF with EF of 30-35% last month, mitral regurgitation, type 2 diabetes, hyperlipidemia, hyperlipidemia, hypertension, morbid obesity, pulmonary hypertension who is coming to the ED with chest pain.   Per patient, she started having left sided CP non-radiated, pressure like and relieved by rest since yesterday. She denies dizziness, diaphoresis, palpitations, but complains of orthopnea and pitting edema lower extremities.She also mentions a30+ pound weight gain in the past few weeks, since she was taken off of her diuretics by cardiology. She has improved her EF from 15-20% to 30-35% in the last few months with cardiac rehab. She states that she has been compliant with diet and fluid intake.  Assessment & Plan  Acute on chronic combined systolic and diastolic congestive heart failure  -Cardiology/CHF team consulted and appreciated.  -Echocardiogram 9/26 EF 30-35% -Continue IV lasix, spironolactone, metolazone. -Started on milrinone  -Pending repeat echocardiogram -Weight down from admission, 10lbs -Continue to monitor intake/output, daily weights  Hyperlipidemia LDL goal <70 -Not on a statin. -Continue lifestyle modifications.  Type 2 diabetes, uncontrolled, with neuropathy (HCC) -Continue insulin sliding scale and CBG monitoring  Hypothyroidism -Not currently taking levothyroxine. -TSH 3.362  CKD (chronic kidney disease) stage 3, GFR 30-59 ml/min -Creatinine 1.26 -Continue to monitor BMP  Morbid obesity -encourage weight loss -going to cardiac rehab  DVT Prophylaxis  Lovenox  Code Status: Full  Family Communication: None at  bedside  Disposition Plan: Admitted, continue treatment of CHF. Home when stable.  Consultants Cardiology/CHF  Procedures  None  Antibiotics   Anti-infectives    None      Subjective:   Christine Knight seen and examined today.  Patient states she is feeling better. Does not feel that she is back to her baseline as far as her breathing. States it's hard to tell if her legs are still swollen given that they are currently wrapped. Denies chest pain, abdominal pain, nausea or vomiting, diarrhea or constipation.   Objective:   Vitals:   08/28/16 1958 08/29/16 0024 08/29/16 0425 08/29/16 1120  BP: 109/62 126/62 116/63 118/67  Pulse: 67 75 74 79  Resp: 18 18 18 18   Temp: 98.4 F (36.9 C) 98.3 F (36.8 C) 97.9 F (36.6 C) 98.4 F (36.9 C)  TempSrc: Oral Oral Oral Oral  SpO2: 100% 100% 100% 92%  Weight:   117 kg (258 lb)   Height:        Intake/Output Summary (Last 24 hours) at 08/29/16 1145 Last data filed at 08/29/16 1120  Gross per 24 hour  Intake           1009.9 ml  Output             1950 ml  Net           -940.1 ml   Filed Weights   08/27/16 0624 08/28/16 0501 08/29/16 0425  Weight: 118.9 kg (262 lb 1.6 oz) 118.2 kg (260 lb 9.6 oz) 117 kg (258 lb)    Exam  General: Well developed, well nourished, NAD, appears stated age  HEENT: NCAT, PERRLA, EOMI, Anicteic Sclera, mucous membranes moist.   Neck: Supple, no JVD, no masses  Cardiovascular: S1 S2 auscultated, no rubs, murmurs or gallops. Regular rate  and rhythm.  Respiratory: Mild diffuse crackles.   Abdomen: Soft, obese, nontender, nondistended, + bowel sounds  Extremities: warm dry without cyanosis clubbing. LE wrapped, 2+ edema in LE.  Neuro: AAOx3, nonfocal  Psych: Normal affect and demeanor with intact judgement and insight  Data Reviewed: I have personally reviewed following labs and imaging studies  CBC:  Recent Labs Lab 08/25/16 1652 08/26/16 0436 08/28/16 0540  WBC 5.2 4.2 3.9*  HGB  13.5 12.7 12.3  HCT 40.7 38.6 37.6  MCV 93.8 94.8 94.7  PLT 233 222 194   Basic Metabolic Panel:  Recent Labs Lab 08/25/16 1652 08/25/16 2119 08/26/16 0436 08/27/16 0323 08/28/16 0540 08/29/16 0505  NA 137  --  138 137 135 136  K 4.2  --  3.9 3.6 3.7 3.4*  CL 105  --  105 102 96* 95*  CO2 24  --  25 28 29 31   GLUCOSE 189*  --  138* 148* 162* 125*  BUN 33*  --  30* 26* 25* 25*  CREATININE 1.50*  --  1.25* 1.22* 1.35* 1.26*  CALCIUM 9.6  --  9.2 9.0 9.2 9.4  MG  --  2.2  --   --  1.9  --    GFR: Estimated Creatinine Clearance: 57.9 mL/min (by C-G formula based on SCr of 1.26 mg/dL (H)). Liver Function Tests:  Recent Labs Lab 08/25/16 2119 08/26/16 0436  AST 44* 33  ALT 16 13*  ALKPHOS 116 108  BILITOT 1.7* 1.7*  PROT 7.7 7.0  ALBUMIN 3.9 3.4*   No results for input(s): LIPASE, AMYLASE in the last 168 hours. No results for input(s): AMMONIA in the last 168 hours. Coagulation Profile: No results for input(s): INR, PROTIME in the last 168 hours. Cardiac Enzymes:  Recent Labs Lab 08/25/16 2119 08/26/16 0313 08/26/16 0919 08/26/16 1615  TROPONINI 0.05* 0.05* 0.04* 0.04*   BNP (last 3 results) No results for input(s): PROBNP in the last 8760 hours. HbA1C: No results for input(s): HGBA1C in the last 72 hours. CBG:  Recent Labs Lab 08/28/16 1139 08/28/16 1628 08/28/16 2056 08/29/16 0628 08/29/16 1122  GLUCAP 190* 227* 149* 132* 175*   Lipid Profile: No results for input(s): CHOL, HDL, LDLCALC, TRIG, CHOLHDL, LDLDIRECT in the last 72 hours. Thyroid Function Tests: No results for input(s): TSH, T4TOTAL, FREET4, T3FREE, THYROIDAB in the last 72 hours. Anemia Panel: No results for input(s): VITAMINB12, FOLATE, FERRITIN, TIBC, IRON, RETICCTPCT in the last 72 hours. Urine analysis:    Component Value Date/Time   COLORURINE RED (A) 04/15/2016 1733   APPEARANCEUR CLOUDY (A) 04/15/2016 1733   LABSPEC 1.009 04/15/2016 1733   PHURINE 7.0 04/15/2016 1733    GLUCOSEU NEGATIVE 04/15/2016 1733   GLUCOSEU NEGATIVE 10/10/2014 1649   HGBUR LARGE (A) 04/15/2016 1733   BILIRUBINUR NEGATIVE 04/15/2016 1733   KETONESUR NEGATIVE 04/15/2016 1733   PROTEINUR NEGATIVE 04/15/2016 1733   UROBILINOGEN 1.0 05/29/2015 1414   NITRITE NEGATIVE 04/15/2016 1733   LEUKOCYTESUR SMALL (A) 04/15/2016 1733   Sepsis Labs: @LABRCNTIP (procalcitonin:4,lacticidven:4)  )No results found for this or any previous visit (from the past 240 hour(s)).    Radiology Studies: No results found.   Scheduled Meds: . aspirin EC  81 mg Oral Daily  . atorvastatin  40 mg Oral q1800  . enoxaparin (LOVENOX) injection  40 mg Subcutaneous Q24H  . furosemide  120 mg Intravenous TID  . gabapentin  300 mg Oral BID  . insulin aspart  0-15 Units Subcutaneous TID WC  .  metolazone  5 mg Oral BID  . pantoprazole  40 mg Oral Daily  . potassium chloride  40 mEq Oral BID  . sodium chloride flush  3 mL Intravenous Q12H  . spironolactone  12.5 mg Oral Daily   Continuous Infusions: . milrinone 0.125 mcg/kg/min (08/29/16 0519)     LOS: 3 days   Time Spent in minutes   30 minutes  Kiarah Eckstein D.O. on 08/29/2016 at 11:45 AM  Between 7am to 7pm - Pager - [phone]  After 7pm go to [email or web address] - password [email or web address]  And look for the night coverage person covering for me after hours  [facility] Office  [phone]

## 2016-08-29 NOTE — Progress Notes (Signed)
Advanced Heart Failure Rounding Note   Subjective:   Admitted with marked volume overload.   Diuresing with IV lasix 80 mg three times daily + 5 mg metolazone. Started on milrinone with minimal improvement in diuresis so far.  Down another 2 lbs.  Creatinine stable.  She states she continues to feel better. Was up most of the night peeing.  Denies incontinence or flushing urine. Saving all for I/Os.   Objective:   Weight Range:  Vital Signs:   Temp:  [97.4 F (36.3 C)-98.4 F (36.9 C)] 97.9 F (36.6 C) (10/19 0425) Pulse Rate:  [67-75] 74 (10/19 0425) Resp:  [16-18] 18 (10/19 0425) BP: (109-126)/(62-78) 116/63 (10/19 0425) SpO2:  [100 %] 100 % (10/19 0425) Weight:  [258 lb (117 kg)] 258 lb (117 kg) (10/19 0425) Last BM Date: 08/26/16  Weight change: Filed Weights   08/27/16 0624 08/28/16 0501 08/29/16 0425  Weight: 262 lb 1.6 oz (118.9 kg) 260 lb 9.6 oz (118.2 kg) 258 lb (117 kg)    Intake/Output:   Intake/Output Summary (Last 24 hours) at 08/29/16 0807 Last data filed at 08/29/16 0600  Gross per 24 hour  Intake            889.9 ml  Output             1250 ml  Net           -360.1 ml     Physical Exam: General:  No resp difficulty. Eating breakfast in chair HEENT: normal Neck: supple. JVP remains elevated to jaw . Carotids 2+ bilat; no bruits. No thyromegaly or nodule noted.  Cor: PMI nondisplaced. RRR. No M/G/R Lungs: Mild crackles throughout on 2 L.  Abdomen: Morbidly obese, soft, NT, ND, no HSM. No bruits or masses. +BS  Extremities: no cyanosis, clubbing, rash, R and LLE ace wraps. 2-3+ edema into thighs Neuro: alert & orientedx3, cranial nerves grossly intact. moves all 4 extremities w/o difficulty. Affect pleasant  Telemetry: Reviewed, NSR  Labs: Basic Metabolic Panel:  Recent Labs Lab 08/25/16 1652 08/25/16 2119 08/26/16 0436 08/27/16 0323 08/28/16 0540  NA 137  --  138 137 135  K 4.2  --  3.9 3.6 3.7  CL 105  --  105 102 96*  CO2 24  --   25 28 29   GLUCOSE 189*  --  138* 148* 162*  BUN 33*  --  30* 26* 25*  CREATININE 1.50*  --  1.25* 1.22* 1.35*  CALCIUM 9.6  --  9.2 9.0 9.2  MG  --  2.2  --   --  1.9    Liver Function Tests:  Recent Labs Lab 08/25/16 2119 08/26/16 0436  AST 44* 33  ALT 16 13*  ALKPHOS 116 108  BILITOT 1.7* 1.7*  PROT 7.7 7.0  ALBUMIN 3.9 3.4*   No results for input(s): LIPASE, AMYLASE in the last 168 hours. No results for input(s): AMMONIA in the last 168 hours.  CBC:  Recent Labs Lab 08/25/16 1652 08/26/16 0436 08/28/16 0540  WBC 5.2 4.2 3.9*  HGB 13.5 12.7 12.3  HCT 40.7 38.6 37.6  MCV 93.8 94.8 94.7  PLT 233 222 194    Cardiac Enzymes:  Recent Labs Lab 08/25/16 2119 08/26/16 0313 08/26/16 0919 08/26/16 1615  TROPONINI 0.05* 0.05* 0.04* 0.04*    BNP: BNP (last 3 results)  Recent Labs  04/11/16 1929 06/05/16 1230 08/25/16 2119  BNP 1,176.5* 906.9* 1,359.9*    ProBNP (last 3 results) No  results for input(s): PROBNP in the last 8760 hours.    Other results:  Imaging: No results found.   Medications:     Scheduled Medications: . aspirin EC  81 mg Oral Daily  . atorvastatin  40 mg Oral q1800  . enoxaparin (LOVENOX) injection  40 mg Subcutaneous Q24H  . furosemide  120 mg Intravenous TID  . gabapentin  300 mg Oral BID  . insulin aspart  0-15 Units Subcutaneous TID WC  . metolazone  5 mg Oral BID  . pantoprazole  40 mg Oral Daily  . potassium chloride  40 mEq Oral BID  . sodium chloride flush  3 mL Intravenous Q12H  . spironolactone  12.5 mg Oral Daily    Infusions: . milrinone 0.125 mcg/kg/min (08/29/16 0519)    PRN Medications: acetaminophen, ondansetron (ZOFRAN) IV, traMADol   Assessment/Plan/Discussion:   1. Acute/Chronic systolic HF due to iCM EF 15-20% Echo 04/14/2016.Recent admit for low out put HF and diuresed over 50 pounds.Hospital course complicated by cardiorenal failure. ECHO 9/26 at the [facility].  EF 30-35% RV  normal.  NYHA IIIb. - Pt remains markedly volume overloaded. - Continue lasix 120 mg tid + 5 mg metolazone twice daily. Now on milrinone 0.125 mcg/kg/min to augment diuresis.  Only slightly improved so far.  Pt has previously done very well on Lasix gtt.  If no increased diuresis this am/afternoon, could consider transition to lasix gtt. (Have previously used as high as 30 mg/hr for this pt) - She has not been on bb due to bradycardia.  - Has not tolerated Entresto with elevated creatinine.  - Continue 12.5 mg spiro daily. Watch renal function carefully. - Intolerant bidil but difficulty with dizziness and hypotension.  - Repeat ECHO pending.  - UNNA boots.   2. CAD s/p NSTEMI and CABG 10/12  - Severe native CAD with patent grafts on LHC 12/13/15. Chest tightness on admit. No CP - Continue ASA 81 mg . Continue atorvastatin 40 mg daily.  3. Morbid obesity 4. Pulmonary HTN - mild to mod RHC 12/13/15. PVR 3.24.  5. Recent AKI on CKD stage III- Creatinine on 9/25 3.1.   - Continue to follow closely with diuresis. Stable.   Continue diuresis. Can switch to lasix gtt with good previous results if UO does not continue to improve with addition of milrinone.   Length of Stay: 3  [name] PA-C  08/29/2016, 8:07 AM  Advanced Heart Failure Team Pager [phone] (M-F; 7a - 4p)  Please contact [facility] for night-coverage after hours (4p -7a ) and weekends on [email or web address]  Patient seen and examined with Otilio Saber, PA-C. We discussed all aspects of the encounter. I agree with the assessment and plan as stated above.   Seems to be improving with milrinone. Diuresis picking up some. Still 36 pounds from previous d/c weight (222). Will continue current regimen today and see how she does. Trying to avoid lasix gtt due to IV shortage, if possible. Renal function climbing slowly. Willl follow closely. Electrolytes stable.   Bensimhon, Daniel,MD 9:29 AM

## 2016-08-30 ENCOUNTER — Encounter (HOSPITAL_COMMUNITY): Admission: RE | Admit: 2016-08-30 | Payer: Medicare Other | Source: Ambulatory Visit

## 2016-08-30 LAB — BASIC METABOLIC PANEL
ANION GAP: 8 (ref 5–15)
BUN: 28 mg/dL — ABNORMAL HIGH (ref 6–20)
CO2: 34 mmol/L — ABNORMAL HIGH (ref 22–32)
Calcium: 9.2 mg/dL (ref 8.9–10.3)
Chloride: 93 mmol/L — ABNORMAL LOW (ref 101–111)
Creatinine, Ser: 1.32 mg/dL — ABNORMAL HIGH (ref 0.44–1.00)
GFR, EST AFRICAN AMERICAN: 48 mL/min — AB (ref >60)
GFR, EST NON AFRICAN AMERICAN: 41 mL/min — AB (ref >60)
Glucose, Bld: 191 mg/dL — ABNORMAL HIGH (ref 65–99)
POTASSIUM: 3.6 mmol/L (ref 3.5–5.1)
SODIUM: 135 mmol/L (ref 135–145)

## 2016-08-30 LAB — GLUCOSE, CAPILLARY
GLUCOSE-CAPILLARY: 193 mg/dL — AB (ref 65–99)
GLUCOSE-CAPILLARY: 212 mg/dL — AB (ref 65–99)
GLUCOSE-CAPILLARY: 155 mg/dL — AB (ref 65–99)
Glucose-Capillary: 183 mg/dL — ABNORMAL HIGH (ref 65–99)

## 2016-08-30 NOTE — Progress Notes (Signed)
Orthopedic Tech Progress Note Patient Details:  Christine Knight [birth year] [id number]  Ortho Devices Type of Ortho Device: Roland RackUnna boot Ortho Device/Splint Location: bilateral Ortho Device/Splint Interventions: Application   [name] 08/30/2016, 3:12 PM

## 2016-08-30 NOTE — Progress Notes (Signed)
PROGRESS NOTE    Christine Knight  BJY:[id number] DOB: Jan 10, 1951 DOA: 08/25/2016 PCP: Roxanne Mins, PA-C   Chief Complaint  Patient presents with  . Chest Pain     Brief Narrative:  Yunuen Mininallis a 65 y.o.femalewith medical history significant of CAD, NSTEMI, CABG in 08/2011, chronic combined diastolic/systolic CHF with EF of 30-35% last month, mitral regurgitation, type 2 diabetes, hyperlipidemia, hyperlipidemia, hypertension, morbid obesity, pulmonary hypertension who is coming to the ED with chest pain.   Per patient, she started having left sided CP non-radiated, pressure like and relieved by rest since yesterday. She denies dizziness, diaphoresis, palpitations, but complains of orthopnea and pitting edema lower extremities.She also mentions a30+ pound weight gain in the past few weeks, since she was taken off of her diuretics by cardiology. She has improved her EF from 15-20% to 30-35% in the last few months with cardiac rehab. She states that she has been compliant with diet and fluid intake.  Assessment & Plan  Acute on chronic combined systolic and diastolic congestive heart failure  -Cardiology/CHF team consulted and appreciated.  -Echocardiogram 9/26 EF 30-35% -Continue IV lasix, spironolactone, metolazone, milrinone -Repeat Echocardiogram pending -Urine output over past 24 hours 3200cc -Weight down from admission, 12lbs -Continue to monitor intake/output, daily weights  Hyperlipidemia LDL goal <70 -Not on a statin. -Continue lifestyle modifications.  Type 2 diabetes, uncontrolled, with neuropathy (HCC) -Continue insulin sliding scale and CBG monitoring  Hypothyroidism -Not currently taking levothyroxine. -TSH 3.362  CKD (chronic kidney disease) stage 3, GFR 30-59 ml/min -Creatinine 1.32 -Continue to monitor BMP  Morbid obesity -encourage weight loss -going to cardiac rehab  DVT Prophylaxis  Lovenox  Code Status: Full  Family  Communication: None at bedside  Disposition Plan: Admitted, continue treatment of CHF. Home when stable.  Consultants Cardiology/CHF  Procedures  None  Antibiotics   Anti-infectives    None      Subjective:   Christine Knight seen and examined today.  Patient states she is feeling better but does not feel back to her baseline.  Feels her legs are smaller.  Denies chest pain, abdominal pain, nausea or vomiting, diarrhea or constipation.   Objective:   Vitals:   08/29/16 2014 08/30/16 0524 08/30/16 0700 08/30/16 1140  BP: 116/72 (!) 100/59 109/61 (!) 106/49  Pulse: 78 77  77  Resp: 18 18  18   Temp: 98.3 F (36.8 C) 98.6 F (37 C)  98.7 F (37.1 C)  TempSrc: Oral Oral  Oral  SpO2: 94% 95%  90%  Weight:  116.3 kg (256 lb 6.4 oz)    Height:        Intake/Output Summary (Last 24 hours) at 08/30/16 1300 Last data filed at 08/30/16 0900  Gross per 24 hour  Intake           1371.6 ml  Output             2500 ml  Net          -1128.4 ml   Filed Weights   08/28/16 0501 08/29/16 0425 08/30/16 0524  Weight: 118.2 kg (260 lb 9.6 oz) 117 kg (258 lb) 116.3 kg (256 lb 6.4 oz)    Exam  General: Well developed, well nourished, NAD, appears stated age  HEENT: NCAT, mucous membranes moist.   Cardiovascular: S1 S2 auscultated, no murmurs, RRR  Respiratory: Diminished breath sounds, but clear   Abdomen: Soft, obese, nontender, nondistended, + bowel sounds  Extremities: warm dry without cyanosis clubbing. 2+ edema in LE.  UNNA boot removed.  Neuro: AAOx3, nonfocal  Psych: Normal affect and demeanor with intact judgement and insight  Data Reviewed: I have personally reviewed following labs and imaging studies  CBC:  Recent Labs Lab 08/25/16 1652 08/26/16 0436 08/28/16 0540  WBC 5.2 4.2 3.9*  HGB 13.5 12.7 12.3  HCT 40.7 38.6 37.6  MCV 93.8 94.8 94.7  PLT 233 222 194   Basic Metabolic Panel:  Recent Labs Lab 08/25/16 2119 08/26/16 0436 08/27/16 0323  08/28/16 0540 08/29/16 0505 08/30/16 0317  NA  --  138 137 135 136 135  K  --  3.9 3.6 3.7 3.4* 3.6  CL  --  105 102 96* 95* 93*  CO2  --  25 28 29 31  34*  GLUCOSE  --  138* 148* 162* 125* 191*  BUN  --  30* 26* 25* 25* 28*  CREATININE  --  1.25* 1.22* 1.35* 1.26* 1.32*  CALCIUM  --  9.2 9.0 9.2 9.4 9.2  MG 2.2  --   --  1.9  --   --    GFR: Estimated Creatinine Clearance: 55.1 mL/min (by C-G formula based on SCr of 1.32 mg/dL (H)). Liver Function Tests:  Recent Labs Lab 08/25/16 2119 08/26/16 0436  AST 44* 33  ALT 16 13*  ALKPHOS 116 108  BILITOT 1.7* 1.7*  PROT 7.7 7.0  ALBUMIN 3.9 3.4*   No results for input(s): LIPASE, AMYLASE in the last 168 hours. No results for input(s): AMMONIA in the last 168 hours. Coagulation Profile: No results for input(s): INR, PROTIME in the last 168 hours. Cardiac Enzymes:  Recent Labs Lab 08/25/16 2119 08/26/16 0313 08/26/16 0919 08/26/16 1615  TROPONINI 0.05* 0.05* 0.04* 0.04*   BNP (last 3 results) No results for input(s): PROBNP in the last 8760 hours. HbA1C: No results for input(s): HGBA1C in the last 72 hours. CBG:  Recent Labs Lab 08/29/16 0628 08/29/16 1122 08/29/16 1655 08/30/16 0625 08/30/16 1137  GLUCAP 132* 175* 179* 183* 193*   Lipid Profile: No results for input(s): CHOL, HDL, LDLCALC, TRIG, CHOLHDL, LDLDIRECT in the last 72 hours. Thyroid Function Tests: No results for input(s): TSH, T4TOTAL, FREET4, T3FREE, THYROIDAB in the last 72 hours. Anemia Panel: No results for input(s): VITAMINB12, FOLATE, FERRITIN, TIBC, IRON, RETICCTPCT in the last 72 hours. Urine analysis:    Component Value Date/Time   COLORURINE RED (A) 04/15/2016 1733   APPEARANCEUR CLOUDY (A) 04/15/2016 1733   LABSPEC 1.009 04/15/2016 1733   PHURINE 7.0 04/15/2016 1733   GLUCOSEU NEGATIVE 04/15/2016 1733   GLUCOSEU NEGATIVE 10/10/2014 1649   HGBUR LARGE (A) 04/15/2016 1733   BILIRUBINUR NEGATIVE 04/15/2016 1733   KETONESUR  NEGATIVE 04/15/2016 1733   PROTEINUR NEGATIVE 04/15/2016 1733   UROBILINOGEN 1.0 05/29/2015 1414   NITRITE NEGATIVE 04/15/2016 1733   LEUKOCYTESUR SMALL (A) 04/15/2016 1733   Sepsis Labs: @LABRCNTIP (procalcitonin:4,lacticidven:4)  )No results found for this or any previous visit (from the past 240 hour(s)).    Radiology Studies: No results found.   Scheduled Meds: . aspirin EC  81 mg Oral Daily  . atorvastatin  40 mg Oral q1800  . enoxaparin (LOVENOX) injection  40 mg Subcutaneous Q24H  . furosemide  120 mg Intravenous TID  . gabapentin  300 mg Oral BID  . insulin aspart  0-15 Units Subcutaneous TID WC  . metolazone  5 mg Oral BID  . pantoprazole  40 mg Oral Daily  . potassium chloride  40 mEq Oral BID  . sodium chloride  flush  3 mL Intravenous Q12H  . spironolactone  12.5 mg Oral Daily   Continuous Infusions: . milrinone 0.125 mcg/kg/min (08/30/16 0540)     LOS: 4 days   Time Spent in minutes   30 minutes  Christine Knight D.O. on 08/30/2016 at 1:00 PM  Between 7am to 7pm - Pager - [phone]  After 7pm go to [email or web address] - password [email or web address]  And look for the night coverage person covering for me after hours  [facility] Office  [phone]

## 2016-08-30 NOTE — Progress Notes (Addendum)
Advanced Heart Failure Rounding Note   Subjective:   Admitted with marked volume overload in setting of having to hold diuretics with marked AKI.   Diureses improved with IV lasix 120 mg three times daily + 5 mg metolazone. Now also on milrinone 0.125 mcg/kg/min to augment diuresis.   Feeling better. Peeing a lot.   Out 1.9 L yesterday. (3.25 L UO, most this admission). Though weight shows only 2 lbs.    Objective:   Weight Range:  Vital Signs:   Temp:  [93.2 F (34 C)-98.6 F (37 C)] 93.2 F (34 C) (10/20 0842) Pulse Rate:  [77-79] 77 (10/20 0524) Resp:  [18] 18 (10/20 0524) BP: (100-118)/(59-72) 109/61 (10/20 0700) SpO2:  [92 %-95 %] 95 % (10/20 0524) Weight:  [256 lb 6.4 oz (116.3 kg)] 256 lb 6.4 oz (116.3 kg) (10/20 0524) Last BM Date: 08/26/16  Weight change: Filed Weights   08/28/16 0501 08/29/16 0425 08/30/16 0524  Weight: 260 lb 9.6 oz (118.2 kg) 258 lb (117 kg) 256 lb 6.4 oz (116.3 kg)    Intake/Output:   Intake/Output Summary (Last 24 hours) at 08/30/16 0902 Last data filed at 08/30/16 0755  Gross per 24 hour  Intake           1251.6 ml  Output             3200 ml  Net          -1948.4 ml     Physical Exam: General:  Seated in chair. NAD. HEENT: normal. On 02 via [place].  Neck: supple. JVP elevated to jaw. Carotids 2+ bilat; no bruits. No thyromegaly or lymphadenopathy. Cor: PMI nondisplaced. RRR. No M/G/R Lungs: Diminished throughout with mild crackles.  Abdomen: Morbidly obese, soft, NT, ND, no HSM. No bruits or masses. +BS  Extremities: no cyanosis, clubbing, rash.  2+ edema into thighs. Wraps off.   Neuro: alert & orientedx3, cranial nerves grossly intact. moves all 4 extremities w/o difficulty. Affect pleasant  Telemetry: Reviewed, NSR  Labs: Basic Metabolic Panel:  Recent Labs Lab 08/25/16 2119 08/26/16 0436 08/27/16 0323 08/28/16 0540 08/29/16 0505 08/30/16 0317  NA  --  138 137 135 136 135  K  --  3.9 3.6 3.7 3.4* 3.6  CL  --   105 102 96* 95* 93*  CO2  --  25 28 29 31  34*  GLUCOSE  --  138* 148* 162* 125* 191*  BUN  --  30* 26* 25* 25* 28*  CREATININE  --  1.25* 1.22* 1.35* 1.26* 1.32*  CALCIUM  --  9.2 9.0 9.2 9.4 9.2  MG 2.2  --   --  1.9  --   --     Liver Function Tests:  Recent Labs Lab 08/25/16 2119 08/26/16 0436  AST 44* 33  ALT 16 13*  ALKPHOS 116 108  BILITOT 1.7* 1.7*  PROT 7.7 7.0  ALBUMIN 3.9 3.4*   No results for input(s): LIPASE, AMYLASE in the last 168 hours. No results for input(s): AMMONIA in the last 168 hours.  CBC:  Recent Labs Lab 08/25/16 1652 08/26/16 0436 08/28/16 0540  WBC 5.2 4.2 3.9*  HGB 13.5 12.7 12.3  HCT 40.7 38.6 37.6  MCV 93.8 94.8 94.7  PLT 233 222 194    Cardiac Enzymes:  Recent Labs Lab 08/25/16 2119 08/26/16 0313 08/26/16 0919 08/26/16 1615  TROPONINI 0.05* 0.05* 0.04* 0.04*    BNP: BNP (last 3 results)  Recent Labs  04/11/16 1929 06/05/16  1230 08/25/16 2119  BNP 1,176.5* 906.9* 1,359.9*    ProBNP (last 3 results) No results for input(s): PROBNP in the last 8760 hours.    Other results:  Imaging: No results found.   Medications:     Scheduled Medications: . aspirin EC  81 mg Oral Daily  . atorvastatin  40 mg Oral q1800  . enoxaparin (LOVENOX) injection  40 mg Subcutaneous Q24H  . furosemide  120 mg Intravenous TID  . gabapentin  300 mg Oral BID  . insulin aspart  0-15 Units Subcutaneous TID WC  . metolazone  5 mg Oral BID  . pantoprazole  40 mg Oral Daily  . potassium chloride  40 mEq Oral BID  . sodium chloride flush  3 mL Intravenous Q12H  . spironolactone  12.5 mg Oral Daily    Infusions: . milrinone 0.125 mcg/kg/min (08/30/16 0540)    PRN Medications: acetaminophen, ondansetron (ZOFRAN) IV, traMADol   Assessment/Plan/Discussion:   1. Acute/Chronic systolic HF due to iCM EF 15-20% Echo 04/14/2016.Recent admit for low out put HF and diuresed over 50 pounds.Hospital course complicated by cardiorenal  failure. ECHO 9/26 at the [facility]  EF 30-35% RV normal.  NYHA IIIb. - Pt remains markedly volume overloaded. - Continue lasix 120 mg tid + 5 mg metolazone BID - Continue milrinone 0.125 mcg/kg/min. Diuresis improved.  - No BB with hx of bradycardia. - Has not tolerated Entresto with elevated creatinine.  - Continue 12.5 mg spiro daily. Watch renal function carefully. - Intolerant bidil but difficulty with dizziness and hypotension.  - Repeat ECHO pending.  - Replace UNNA boots. They are off this am.  2. CAD s/p NSTEMI and CABG 10/12  - Severe native CAD with patent grafts on [facility] 12/13/15. Chest tightness on admit. No CP - Continue ASA 81 mg . Continue atorvastatin 40 mg daily.  3. Morbid obesity 4. Pulmonary HTN - mild to mod RHC 12/13/15. PVR 3.24.  5. Recent AKI on CKD stage III- Creatinine on 9/25 3.1.   - Continue to follow closely with diuresis. Stable thus far.   Length of Stay: 4  [name] PA-C  08/30/2016, 9:02 AM  Advanced Heart Failure Team Pager [phone] (M-F; 7a - 4p)  Please contact [facility] for night-coverage after hours (4p -7a ) and weekends on [email or web address]  Patient seen and examined with Otilio Saber, PA-C. We discussed all aspects of the encounter. I agree with the assessment and plan as stated above.   Still volume overloaded but urine output picking up with milrinone. Weight coming down slowly. Still 34 pounds from previous d/c weight (222). Will continue current regimen  and see how she does. Trying to avoid lasix gtt due to IV shortage, if possible. Renal function stable. Willl follow closely. Electrolytes stable. Place UNNA boots.   Bensimhon, Daniel,MD 11:09 AM

## 2016-08-31 LAB — GLUCOSE, CAPILLARY
GLUCOSE-CAPILLARY: 202 mg/dL — AB (ref 65–99)
GLUCOSE-CAPILLARY: 242 mg/dL — AB (ref 65–99)
GLUCOSE-CAPILLARY: 195 mg/dL — AB (ref 65–99)
Glucose-Capillary: 214 mg/dL — ABNORMAL HIGH (ref 65–99)

## 2016-08-31 LAB — BASIC METABOLIC PANEL
Anion gap: 10 (ref 5–15)
BUN: 30 mg/dL — AB (ref 6–20)
CHLORIDE: 90 mmol/L — AB (ref 101–111)
CO2: 34 mmol/L — ABNORMAL HIGH (ref 22–32)
CREATININE: 1.37 mg/dL — AB (ref 0.44–1.00)
Calcium: 9.5 mg/dL (ref 8.9–10.3)
GFR calc Af Amer: 46 mL/min — ABNORMAL LOW (ref >60)
GFR calc non Af Amer: 40 mL/min — ABNORMAL LOW (ref >60)
GLUCOSE: 180 mg/dL — AB (ref 65–99)
Potassium: 3.2 mmol/L — ABNORMAL LOW (ref 3.5–5.1)
SODIUM: 134 mmol/L — AB (ref 135–145)

## 2016-08-31 MED ORDER — POLYETHYLENE GLYCOL 3350 17 G PO PACK
17.0000 g | PACK | Freq: Every day | ORAL | Status: DC
  Administered 2016-08-31 – 2016-09-04 (×5): 17 g via ORAL
  Filled 2016-08-31 (×6): qty 1

## 2016-08-31 MED ORDER — MAGNESIUM HYDROXIDE 400 MG/5ML PO SUSP
15.0000 mL | Freq: Every day | ORAL | Status: DC | PRN
  Administered 2016-08-31: 15 mL via ORAL
  Filled 2016-08-31: qty 30

## 2016-08-31 MED ORDER — ACETAZOLAMIDE 250 MG PO TABS
250.0000 mg | ORAL_TABLET | Freq: Two times a day (BID) | ORAL | Status: DC
  Administered 2016-08-31 – 2016-09-06 (×13): 250 mg via ORAL
  Filled 2016-08-31 (×13): qty 1

## 2016-08-31 MED ORDER — POTASSIUM CHLORIDE CRYS ER 20 MEQ PO TBCR
40.0000 meq | EXTENDED_RELEASE_TABLET | Freq: Once | ORAL | Status: AC
  Administered 2016-08-31: 40 meq via ORAL

## 2016-08-31 MED ORDER — BISACODYL 10 MG RE SUPP: 10.0000 mg | Freq: Every day | RECTAL | Status: DC | PRN

## 2016-08-31 MED ORDER — FUROSEMIDE 10 MG/ML IJ SOLN
20.0000 mg/h | INTRAVENOUS | Status: DC
  Administered 2016-08-31 – 2016-09-04 (×7): 20 mg/h via INTRAVENOUS
  Filled 2016-08-31 (×17): qty 25

## 2016-08-31 MED ORDER — POTASSIUM CHLORIDE CRYS ER 20 MEQ PO TBCR
60.0000 meq | EXTENDED_RELEASE_TABLET | Freq: Two times a day (BID) | ORAL | Status: DC
  Administered 2016-08-31 – 2016-09-04 (×8): 60 meq via ORAL
  Filled 2016-08-31 (×8): qty 3

## 2016-08-31 NOTE — Progress Notes (Addendum)
Advanced Heart Failure Rounding Note   Subjective:   Admitted with marked volume overload in setting of having to hold diuretics with marked AKI.   Diureses improved with IV lasix 120 mg three times daily + 5 mg metolazone. Now also on milrinone 0.125 mcg/kg/min to augment diuresis.   Feeling better. Peeing a lot.   Negative 2.0 L yesterday. No weight recorded today.   Creatinine stable at 1.3. K 3.2    Objective:   Weight Range:  Vital Signs:   Temp:  [97.8 F (36.6 C)-98.1 F (36.7 C)] 97.8 F (36.6 C) (10/21 1230) Pulse Rate:  [72-77] 72 (10/21 1230) Resp:  [17-18] 18 (10/21 1230) BP: (91-128)/(48-69) 91/48 (10/21 1230) SpO2:  [93 %-97 %] 95 % (10/21 1230) Last BM Date: 08/26/16  Weight change: Filed Weights   08/28/16 0501 08/29/16 0425 08/30/16 0524  Weight: 260 lb 9.6 oz (118.2 kg) 258 lb (117 kg) 256 lb 6.4 oz (116.3 kg)    Intake/Output:   Intake/Output Summary (Last 24 hours) at 08/31/16 1248 Last data filed at 08/31/16 0900  Gross per 24 hour  Intake             1482 ml  Output             4250 ml  Net            -2768 ml     Physical Exam: General:  Seated in chair. NAD. HEENT: normal. On 02 via [place].  Neck: supple. JVP elevated to jaw. Carotids 2+ bilat; no bruits. No thyromegaly or lymphadenopathy. Cor: PMI nondisplaced. RRR. No M/G/R Lungs: Diminished throughout with mild crackles.  Abdomen: Morbidly obese, soft, NT, ND, no HSM. No bruits or masses. +BS  Extremities: no cyanosis, clubbing, rash.  2+ edema into thighs. UNNA boots on   Neuro: alert & orientedx3, cranial nerves grossly intact. moves all 4 extremities w/o difficulty. Affect pleasant  Telemetry: Reviewed, NSR  Labs: Basic Metabolic Panel:  Recent Labs Lab 08/25/16 2119  08/27/16 0323 08/28/16 0540 08/29/16 0505 08/30/16 0317 08/31/16 0328  NA  --   < > 137 135 136 135 134*  K  --   < > 3.6 3.7 3.4* 3.6 3.2*  CL  --   < > 102 96* 95* 93* 90*  CO2  --   < > 28 29 31   34* 34*  GLUCOSE  --   < > 148* 162* 125* 191* 180*  BUN  --   < > 26* 25* 25* 28* 30*  CREATININE  --   < > 1.22* 1.35* 1.26* 1.32* 1.37*  CALCIUM  --   < > 9.0 9.2 9.4 9.2 9.5  MG 2.2  --   --  1.9  --   --   --   < > = values in this interval not displayed.  Liver Function Tests:  Recent Labs Lab 08/25/16 2119 08/26/16 0436  AST 44* 33  ALT 16 13*  ALKPHOS 116 108  BILITOT 1.7* 1.7*  PROT 7.7 7.0  ALBUMIN 3.9 3.4*   No results for input(s): LIPASE, AMYLASE in the last 168 hours. No results for input(s): AMMONIA in the last 168 hours.  CBC:  Recent Labs Lab 08/25/16 1652 08/26/16 0436 08/28/16 0540  WBC 5.2 4.2 3.9*  HGB 13.5 12.7 12.3  HCT 40.7 38.6 37.6  MCV 93.8 94.8 94.7  PLT 233 222 194    Cardiac Enzymes:  Recent Labs Lab 08/25/16 2119 08/26/16 0313  08/26/16 0919 08/26/16 1615  TROPONINI 0.05* 0.05* 0.04* 0.04*    BNP: BNP (last 3 results)  Recent Labs  04/11/16 1929 06/05/16 1230 08/25/16 2119  BNP 1,176.5* 906.9* 1,359.9*    ProBNP (last 3 results) No results for input(s): PROBNP in the last 8760 hours.    Other results:  Imaging: No results found.   Medications:     Scheduled Medications: . aspirin EC  81 mg Oral Daily  . atorvastatin  40 mg Oral q1800  . enoxaparin (LOVENOX) injection  40 mg Subcutaneous Q24H  . furosemide  120 mg Intravenous TID  . gabapentin  300 mg Oral BID  . insulin aspart  0-15 Units Subcutaneous TID WC  . metolazone  5 mg Oral BID  . pantoprazole  40 mg Oral Daily  . polyethylene glycol  17 g Oral Daily  . potassium chloride  40 mEq Oral BID  . sodium chloride flush  3 mL Intravenous Q12H  . spironolactone  12.5 mg Oral Daily    Infusions: . milrinone 0.125 mcg/kg/min (08/31/16 0544)    PRN Medications: acetaminophen, bisacodyl, ondansetron (ZOFRAN) IV, traMADol   Assessment/Plan/Discussion:   1. Acute/Chronic systolic HF due to iCM EF 15-20% Echo 04/14/2016.Recent admit for low out  put HF and diuresed over 50 pounds.Hospital course complicated by cardiorenal failure. ECHO 9/26 at the [facility].  EF 30-35% RV normal.  NYHA IIIb. - Diuresis improving on current regimen. But she remains markedly volume overloaded and weight coming down slowly. Previous d/c weight was 222# - Continue lasix 120 mg tid + 5 mg metolazone BID - Continue milrinone 0.125 mcg/kg/min. Diuresis improved.  - No BB with hx of bradycardia. - Has not tolerated Entresto with elevated creatinine.  - Continue 12.5 mg spiro daily. Watch renal function carefully. - Intolerant bidil but difficulty with dizziness and hypotension.  - UNNA boots on 2. CAD s/p NSTEMI and CABG 10/12  - Severe native CAD with patent grafts on [facility] 12/13/15. Chest tightness on admit. Now resolved. Troponin 0.04 - Continue ASA 81 mg . Continue atorvastatin 40 mg daily.  3. Morbid obesity 4. Pulmonary HTN - mild to mod RHC 12/13/15. PVR 3.24.  5. Recent AKI on CKD stage III- Creatinine on 9/25 3.1.   - Continue to follow closely with diuresis. Stable thus far.  6. Hypokalemia - Supp K  Length of Stay: 5  Bensimhon, Daniel MD 08/31/2016, 12:48 PM Advanced Heart Failure Team Pager [phone] (M-F; 7a - 4p)  Please contact [facility] for night-coverage after hours (4p -7a ) and weekends on [email or web address]  Addendum: Patient weight this am. Weight 253. Will change to lasix gtt at 20. May need Foley.  Bensimhon, Daniel,MD 1:28 PM

## 2016-08-31 NOTE — Progress Notes (Signed)
PROGRESS NOTE    Christine Knight  ZOX:[id number] DOB: 1951-04-17 DOA: 08/25/2016 PCP: Roxanne Mins, PA-C   Chief Complaint  Patient presents with  . Chest Pain     Brief Narrative:  Christine Knight a 65 y.o.femalewith medical history significant of CAD, NSTEMI, CABG in 08/2011, chronic combined diastolic/systolic CHF with EF of 30-35% last month, mitral regurgitation, type 2 diabetes, hyperlipidemia, hyperlipidemia, hypertension, morbid obesity, pulmonary hypertension who is coming to the ED with chest pain.   Per patient, she started having left sided CP non-radiated, pressure like and relieved by rest since yesterday. She denies dizziness, diaphoresis, palpitations, but complains of orthopnea and pitting edema lower extremities.She also mentions a30+ pound weight gain in the past few weeks, since she was taken off of her diuretics by cardiology. She has improved her EF from 15-20% to 30-35% in the last few months with cardiac rehab. She states that she has been compliant with diet and fluid intake.  Assessment & Plan  Acute on chronic combined systolic and diastolic congestive heart failure  -Cardiology/CHF team consulted and appreciated.  -Echocardiogram 9/26 EF 30-35% -Continue IV lasix, spironolactone, metolazone, milrinone -Repeat Echocardiogram pending -Urine output over past 24 hours 3750cc -Weight down from admission, 12lbs (pending weight this morning) -Continue to monitor intake/output, daily weights  Hyperlipidemia LDL goal <70 -Not on a statin. -Continue lifestyle modifications.  Type 2 diabetes, uncontrolled, with neuropathy (HCC) -Continue insulin sliding scale and CBG monitoring  Hypothyroidism -Not currently taking levothyroxine. -TSH 3.362  CKD (chronic kidney disease) stage 3, GFR 30-59 ml/min -Creatinine 1.37 -Continue to monitor BMP  Morbid obesity -encourage weight loss -going to cardiac rehab  DVT Prophylaxis  Lovenox  Code  Status: Full  Family Communication: None at bedside  Disposition Plan: Admitted, continue treatment of CHF. Home when stable.  Consultants Cardiology/CHF  Procedures  None  Antibiotics   Anti-infectives    None      Subjective:   Christine Knight seen and examined today.  Patient feels her breathing is close to baseline.  Feels her weight is going down.  Denies chest pain, abdominal pain, nausea or vomiting, diarrhea or constipation.   Objective:   Vitals:   08/30/16 2058 08/31/16 0540 08/31/16 0830 08/31/16 0912  BP: 114/66 128/67  112/69  Pulse: 77 77  76  Resp: 18 17    Temp: 98.1 F (36.7 C) 98.1 F (36.7 C)    TempSrc: Oral Oral    SpO2: 97% 93% 97% 97%  Weight:      Height:        Intake/Output Summary (Last 24 hours) at 08/31/16 1205 Last data filed at 08/31/16 0900  Gross per 24 hour  Intake             1482 ml  Output             4250 ml  Net            -2768 ml   Filed Weights   08/28/16 0501 08/29/16 0425 08/30/16 0524  Weight: 118.2 kg (260 lb 9.6 oz) 117 kg (258 lb) 116.3 kg (256 lb 6.4 oz)    Exam  General: Well developed, well nourished, NAD  HEENT: NCAT, mucous membranes moist.   Cardiovascular: S1 S2 auscultated, no murmurs, RRR  Respiratory: Diminished breath sounds, but clear   Abdomen: Soft, obese, nontender, nondistended, + bowel sounds  Extremities: warm dry without cyanosis clubbing. 2+ edema in LE. UNNA boots in place  Neuro: AAOx3, nonfocal  Psych:  Normal affect and demeanor with intact judgement and insight, pleasant  Data Reviewed: I have personally reviewed following labs and imaging studies  CBC:  Recent Labs Lab 08/25/16 1652 08/26/16 0436 08/28/16 0540  WBC 5.2 4.2 3.9*  HGB 13.5 12.7 12.3  HCT 40.7 38.6 37.6  MCV 93.8 94.8 94.7  PLT 233 222 194   Basic Metabolic Panel:  Recent Labs Lab 08/25/16 2119  08/27/16 0323 08/28/16 0540 08/29/16 0505 08/30/16 0317 08/31/16 0328  NA  --   < > 137 135  136 135 134*  K  --   < > 3.6 3.7 3.4* 3.6 3.2*  CL  --   < > 102 96* 95* 93* 90*  CO2  --   < > 28 29 31  34* 34*  GLUCOSE  --   < > 148* 162* 125* 191* 180*  BUN  --   < > 26* 25* 25* 28* 30*  CREATININE  --   < > 1.22* 1.35* 1.26* 1.32* 1.37*  CALCIUM  --   < > 9.0 9.2 9.4 9.2 9.5  MG 2.2  --   --  1.9  --   --   --   < > = values in this interval not displayed. GFR: Estimated Creatinine Clearance: 53.1 mL/min (by C-G formula based on SCr of 1.37 mg/dL (H)). Liver Function Tests:  Recent Labs Lab 08/25/16 2119 08/26/16 0436  AST 44* 33  ALT 16 13*  ALKPHOS 116 108  BILITOT 1.7* 1.7*  PROT 7.7 7.0  ALBUMIN 3.9 3.4*   No results for input(s): LIPASE, AMYLASE in the last 168 hours. No results for input(s): AMMONIA in the last 168 hours. Coagulation Profile: No results for input(s): INR, PROTIME in the last 168 hours. Cardiac Enzymes:  Recent Labs Lab 08/25/16 2119 08/26/16 0313 08/26/16 0919 08/26/16 1615  TROPONINI 0.05* 0.05* 0.04* 0.04*   BNP (last 3 results) No results for input(s): PROBNP in the last 8760 hours. HbA1C: No results for input(s): HGBA1C in the last 72 hours. CBG:  Recent Labs Lab 08/30/16 0625 08/30/16 1137 08/30/16 1618 08/30/16 2055 08/31/16 0538  GLUCAP 183* 193* 212* 155* 202*   Lipid Profile: No results for input(s): CHOL, HDL, LDLCALC, TRIG, CHOLHDL, LDLDIRECT in the last 72 hours. Thyroid Function Tests: No results for input(s): TSH, T4TOTAL, FREET4, T3FREE, THYROIDAB in the last 72 hours. Anemia Panel: No results for input(s): VITAMINB12, FOLATE, FERRITIN, TIBC, IRON, RETICCTPCT in the last 72 hours. Urine analysis:    Component Value Date/Time   COLORURINE RED (A) 04/15/2016 1733   APPEARANCEUR CLOUDY (A) 04/15/2016 1733   LABSPEC 1.009 04/15/2016 1733   PHURINE 7.0 04/15/2016 1733   GLUCOSEU NEGATIVE 04/15/2016 1733   GLUCOSEU NEGATIVE 10/10/2014 1649   HGBUR LARGE (A) 04/15/2016 1733   BILIRUBINUR NEGATIVE 04/15/2016  1733   KETONESUR NEGATIVE 04/15/2016 1733   PROTEINUR NEGATIVE 04/15/2016 1733   UROBILINOGEN 1.0 05/29/2015 1414   NITRITE NEGATIVE 04/15/2016 1733   LEUKOCYTESUR SMALL (A) 04/15/2016 1733   Sepsis Labs: @LABRCNTIP (procalcitonin:4,lacticidven:4)  )No results found for this or any previous visit (from the past 240 hour(s)).    Radiology Studies: No results found.   Scheduled Meds: . aspirin EC  81 mg Oral Daily  . atorvastatin  40 mg Oral q1800  . enoxaparin (LOVENOX) injection  40 mg Subcutaneous Q24H  . furosemide  120 mg Intravenous TID  . gabapentin  300 mg Oral BID  . insulin aspart  0-15 Units Subcutaneous TID WC  .  metolazone  5 mg Oral BID  . pantoprazole  40 mg Oral Daily  . polyethylene glycol  17 g Oral Daily  . potassium chloride  40 mEq Oral BID  . sodium chloride flush  3 mL Intravenous Q12H  . spironolactone  12.5 mg Oral Daily   Continuous Infusions: . milrinone 0.125 mcg/kg/min (08/31/16 0544)     LOS: 5 days   Time Spent in minutes   30 minutes  Special Ranes D.O. on 08/31/2016 at 12:05 PM  Between 7am to 7pm - Pager - [phone]  After 7pm go to [email or web address] - password [email or web address]  And look for the night coverage person covering for me after hours  [facility] Office  [phone]

## 2016-09-01 ENCOUNTER — Other Ambulatory Visit (HOSPITAL_COMMUNITY): Payer: Medicare Other

## 2016-09-01 LAB — GLUCOSE, CAPILLARY
GLUCOSE-CAPILLARY: 234 mg/dL — AB (ref 65–99)
Glucose-Capillary: 235 mg/dL — ABNORMAL HIGH (ref 65–99)
Glucose-Capillary: 200 mg/dL — ABNORMAL HIGH (ref 65–99)
Glucose-Capillary: 249 mg/dL — ABNORMAL HIGH (ref 65–99)

## 2016-09-01 LAB — BASIC METABOLIC PANEL
Anion gap: 11 (ref 5–15)
BUN: 34 mg/dL — AB (ref 6–20)
CO2: 35 mmol/L — ABNORMAL HIGH (ref 22–32)
CREATININE: 1.33 mg/dL — AB (ref 0.44–1.00)
Calcium: 9.9 mg/dL (ref 8.9–10.3)
Chloride: 90 mmol/L — ABNORMAL LOW (ref 101–111)
GFR calc Af Amer: 47 mL/min — ABNORMAL LOW (ref >60)
GFR, EST NON AFRICAN AMERICAN: 41 mL/min — AB (ref >60)
GLUCOSE: 249 mg/dL — AB (ref 65–99)
POTASSIUM: 3.5 mmol/L (ref 3.5–5.1)
Sodium: 136 mmol/L (ref 135–145)

## 2016-09-01 LAB — CBC
HCT: 36.7 % (ref 36.0–46.0)
Hemoglobin: 12.3 g/dL (ref 12.0–15.0)
MCH: 31.3 pg (ref 26.0–34.0)
MCHC: 33.5 g/dL (ref 30.0–36.0)
MCV: 93.4 fL (ref 78.0–100.0)
PLATELETS: 218 10*3/uL (ref 150–400)
RBC: 3.93 MIL/uL (ref 3.87–5.11)
RDW: 15.2 % (ref 11.5–15.5)
WBC: 4.9 10*3/uL (ref 4.0–10.5)

## 2016-09-01 MED ORDER — POTASSIUM CHLORIDE CRYS ER 20 MEQ PO TBCR
40.0000 meq | EXTENDED_RELEASE_TABLET | Freq: Once | ORAL | Status: AC
  Administered 2016-09-01: 40 meq via ORAL
  Filled 2016-09-01: qty 2

## 2016-09-01 MED ORDER — SALINE SPRAY 0.65 % NA SOLN
1.0000 | NASAL | Status: DC | PRN
  Filled 2016-09-01: qty 44

## 2016-09-01 NOTE — Progress Notes (Signed)
Echo tech in to do 2-D echo ordered on 08/30/16 and noted [facility]  RN contacted Dr. Gala RomneyBensimhon who canceled order for echo.

## 2016-09-01 NOTE — Progress Notes (Signed)
Advanced Heart Failure Rounding Note   Subjective:    Admitted with marked volume overload in setting of having to hold diuretics with marked AKI.   Switched to lasix gtt yesterday and diamox added. Diuresis much improved. Weight down 6 pounds. No dyspnea.   Creatinine stable at 1.3. K 3.5   Objective:   Weight Range:  Vital Signs:   Temp:  [97.8 F (36.6 C)-98.6 F (37 C)] 98.6 F (37 C) (10/22 0500) Pulse Rate:  [72-74] 72 (10/22 0900) Resp:  [18] 18 (10/21 2024) BP: (91-120)/(48-75) 92/60 (10/22 0900) SpO2:  [95 %-100 %] 99 % (10/22 0900) Weight:  [112 kg (247 lb)-114.9 kg (253 lb 6.4 oz)] 112 kg (247 lb) (10/22 0500) Last BM Date: 08/25/16  Weight change: Filed Weights   08/30/16 0524 08/31/16 1230 09/01/16 0500  Weight: 116.3 kg (256 lb 6.4 oz) 114.9 kg (253 lb 6.4 oz) 112 kg (247 lb)    Intake/Output:   Intake/Output Summary (Last 24 hours) at 09/01/16 1019 Last data filed at 09/01/16 0903  Gross per 24 hour  Intake           784.22 ml  Output             5700 ml  Net         -4915.78 ml     Physical Exam: General:  Seated in chair. NAD. HEENT: normal. On O2 via [place].  Neck: supple. JVP elevated to jaw. Carotids 2+ bilat; no bruits. No thyromegaly or lymphadenopathy. Cor: PMI nondisplaced. RRR. No M/G/R Lungs: Diminished throughout with mild crackles.  Abdomen: Morbidly obese, soft, NT, ND, no HSM. No bruits or masses. +BS  Extremities: no cyanosis, clubbing, rash.  2-3+ edema into thighs. UNNA boots on   Neuro: alert & orientedx3, cranial nerves grossly intact. moves all 4 extremities w/o difficulty. Affect pleasant  Telemetry: Reviewed, NSR  Labs: Basic Metabolic Panel:  Recent Labs Lab 08/25/16 2119  08/28/16 0540 08/29/16 0505 08/30/16 0317 08/31/16 0328 09/01/16 0346  NA  --   < > 135 136 135 134* 136  K  --   < > 3.7 3.4* 3.6 3.2* 3.5  CL  --   < > 96* 95* 93* 90* 90*  CO2  --   < > 29 31 34* 34* 35*  GLUCOSE  --   < > 162* 125*  191* 180* 249*  BUN  --   < > 25* 25* 28* 30* 34*  CREATININE  --   < > 1.35* 1.26* 1.32* 1.37* 1.33*  CALCIUM  --   < > 9.2 9.4 9.2 9.5 9.9  MG 2.2  --  1.9  --   --   --   --   < > = values in this interval not displayed.  Liver Function Tests:  Recent Labs Lab 08/25/16 2119 08/26/16 0436  AST 44* 33  ALT 16 13*  ALKPHOS 116 108  BILITOT 1.7* 1.7*  PROT 7.7 7.0  ALBUMIN 3.9 3.4*   No results for input(s): LIPASE, AMYLASE in the last 168 hours. No results for input(s): AMMONIA in the last 168 hours.  CBC:  Recent Labs Lab 08/25/16 1652 08/26/16 0436 08/28/16 0540 09/01/16 0346  WBC 5.2 4.2 3.9* 4.9  HGB 13.5 12.7 12.3 12.3  HCT 40.7 38.6 37.6 36.7  MCV 93.8 94.8 94.7 93.4  PLT 233 222 194 218    Cardiac Enzymes:  Recent Labs Lab 08/25/16 2119 08/26/16 0313 08/26/16 0919 08/26/16 1615  TROPONINI 0.05* 0.05* 0.04* 0.04*    BNP: BNP (last 3 results)  Recent Labs  04/11/16 1929 06/05/16 1230 08/25/16 2119  BNP 1,176.5* 906.9* 1,359.9*    ProBNP (last 3 results) No results for input(s): PROBNP in the last 8760 hours.    Other results:  Imaging: No results found.   Medications:     Scheduled Medications: . acetaZOLAMIDE  250 mg Oral BID  . aspirin EC  81 mg Oral Daily  . atorvastatin  40 mg Oral q1800  . enoxaparin (LOVENOX) injection  40 mg Subcutaneous Q24H  . gabapentin  300 mg Oral BID  . insulin aspart  0-15 Units Subcutaneous TID WC  . metolazone  5 mg Oral BID  . pantoprazole  40 mg Oral Daily  . polyethylene glycol  17 g Oral Daily  . potassium chloride  60 mEq Oral BID  . sodium chloride flush  3 mL Intravenous Q12H  . spironolactone  12.5 mg Oral Daily    Infusions: . furosemide (LASIX) infusion 20 mg/hr (09/01/16 0025)  . milrinone 0.125 mcg/kg/min (08/31/16 1411)    PRN Medications: acetaminophen, bisacodyl, magnesium hydroxide, ondansetron (ZOFRAN) IV, sodium chloride, traMADol   Assessment/Plan/Discussion:    1. Acute/Chronic systolic HF due to iCM EF 15-20% Echo 04/14/2016.Recent admit for low out put HF and diuresed over 50 pounds.Hospital course complicated by cardiorenal failure. [facility].  EF 30-35% RV normal.  NYHA IIIb. - Diuresis improving on current regimen. Continue lasix gtt, milrinone, diamox and metolazone Previous d/c weight was 222# - No BB with hx of bradycardia. - Has not tolerated Entresto with elevated creatinine.  - Continue 12.5 mg spiro daily. Watch renal function carefully. - Intolerant bidil but difficulty with dizziness and hypotension.  - UNNA boots on 2. CAD s/p NSTEMI and CABG 10/12  - Severe [facility]. Chest tightness on admit. Now resolved. Troponin 0.04 - Continue ASA 81 mg . Continue atorvastatin 40 mg daily.  3. Morbid obesity 4. Pulmonary HTN - mild to mod RHC 12/13/15. PVR 3.24.  5. Recent AKI on CKD stage III- Creatinine on 9/25 3.1.   - Continue to follow closely with diuresis. Stable thus far.  6. Hypokalemia - Supp K  Length of Stay: 6  Joshwa Hemric MD 09/01/2016, 10:19 AM Advanced Heart Failure Team Pager [phone] (M-F; 7a - 4p)  Please contact [facility] for night-coverage after hours (4p -7a ) and weekends on [email or web address]

## 2016-09-01 NOTE — Plan of Care (Signed)
Problem: Activity: Goal: Capacity to carry out activities will improve Outcome: Progressing Patient now able to ambulate in the room without dyspnea   Problem: Education: Goal: Ability to verbalize understanding of medication therapies will improve Outcome: Progressing Verbalizes understanding of diuretics, newly added Diamox

## 2016-09-01 NOTE — Progress Notes (Signed)
PROGRESS NOTE    Christine Knight  EHU:[id number] DOB: 03/18/1951 DOA: 08/25/2016 PCP: Roxanne Mins, PA-C   Chief Complaint  Patient presents with  . Chest Pain     Brief Narrative:  Christine Knight a 65 y.o.femalewith medical history significant of CAD, NSTEMI, CABG in 08/2011, chronic combined diastolic/systolic CHF with EF of 30-35% last month, mitral regurgitation, type 2 diabetes, hyperlipidemia, hyperlipidemia, hypertension, morbid obesity, pulmonary hypertension who is coming to the ED with chest pain.   Per patient, she started having left sided CP non-radiated, pressure like and relieved by rest since yesterday. She denies dizziness, diaphoresis, palpitations, but complains of orthopnea and pitting edema lower extremities.She also mentions a30+ pound weight gain in the past few weeks, since she was taken off of her diuretics by cardiology. She has improved her EF from 15-20% to 30-35% in the last few months with cardiac rehab. She states that she has been compliant with diet and fluid intake.  Assessment & Plan  Acute on chronic combined systolic and diastolic congestive heart failure  -Cardiology/CHF team consulted and appreciated.  -Echocardiogram 9/26 EF 30-35% -Repeat Echocardiogram pending -Continue lasix gtt, milrinone, metolazone, diamox, spironolactone -Urine output over past 24 hours 5200cc -Weight down from admission, 21lbs  -Continue to monitor intake/output, daily weights -In the past, patient has been intolerant to Entresto and Bidil  Hyperlipidemia LDL goal <70 -Not on a statin. -Continue lifestyle modifications.  Type 2 diabetes, uncontrolled, with neuropathy (HCC) -Continue insulin sliding scale and CBG monitoring  Hypothyroidism -Not currently taking levothyroxine. -TSH 3.362  CKD (chronic kidney disease) stage 3, GFR 30-59 ml/min -Creatinine 1.33 -Continue to monitor BMP  Morbid obesity -encourage weight loss -going to cardiac  rehab  Hypokalemia -Secondary to diuresis -Continue supplementation and started on spironolactone -Monitor BMP  DVT Prophylaxis  Lovenox  Code Status: Full  Family Communication: None at bedside  Disposition Plan: Admitted, continue treatment of CHF. Home when stable.  Consultants Cardiology/CHF  Procedures  None  Antibiotics   Anti-infectives    None      Subjective:   Christine Knight seen and examined today.  Patient feels her breathing is much better.  Denies chest pain, abdominal pain, nausea or vomiting, diarrhea or constipation.   Objective:   Vitals:   08/31/16 1230 08/31/16 2024 09/01/16 0500 09/01/16 0900  BP: (!) 91/48 120/63 120/75 92/60  Pulse: 72 73 74 72  Resp: 18 18    Temp: 97.8 F (36.6 C) 98.1 F (36.7 C) 98.6 F (37 C)   TempSrc: Oral Oral Oral   SpO2: 95% 100% 96% 99%  Weight: 114.9 kg (253 lb 6.4 oz)  112 kg (247 lb)   Height: 5\' 6"  (1.676 m)       Intake/Output Summary (Last 24 hours) at 09/01/16 1155 Last data filed at 09/01/16 1130  Gross per 24 hour  Intake           784.22 ml  Output             6500 ml  Net         -5715.78 ml   Filed Weights   08/30/16 0524 08/31/16 1230 09/01/16 0500  Weight: 116.3 kg (256 lb 6.4 oz) 114.9 kg (253 lb 6.4 oz) 112 kg (247 lb)    Exam  General: Well developed, well nourished, no distress  HEENT: NCAT, mucous membranes moist.   Cardiovascular: S1 S2 auscultated, no murmurs, RRR  Respiratory: Diminished breath sounds, mild diffuse crackles  Abdomen: Soft, obese, nontender,  nondistended, + bowel sounds  Extremities: warm dry without cyanosis clubbing. 2+ edema in LE. UNNA boots in place  Neuro: AAOx3, nonfocal  Psych: Normal affect and demeanor with intact judgement and insight, pleasant  Data Reviewed: I have personally reviewed following labs and imaging studies  CBC:  Recent Labs Lab 08/25/16 1652 08/26/16 0436 08/28/16 0540 09/01/16 0346  WBC 5.2 4.2 3.9* 4.9  HGB  13.5 12.7 12.3 12.3  HCT 40.7 38.6 37.6 36.7  MCV 93.8 94.8 94.7 93.4  PLT 233 222 194 218   Basic Metabolic Panel:  Recent Labs Lab 08/25/16 2119  08/28/16 0540 08/29/16 0505 08/30/16 0317 08/31/16 0328 09/01/16 0346  NA  --   < > 135 136 135 134* 136  K  --   < > 3.7 3.4* 3.6 3.2* 3.5  CL  --   < > 96* 95* 93* 90* 90*  CO2  --   < > 29 31 34* 34* 35*  GLUCOSE  --   < > 162* 125* 191* 180* 249*  BUN  --   < > 25* 25* 28* 30* 34*  CREATININE  --   < > 1.35* 1.26* 1.32* 1.37* 1.33*  CALCIUM  --   < > 9.2 9.4 9.2 9.5 9.9  MG 2.2  --  1.9  --   --   --   --   < > = values in this interval not displayed. GFR: Estimated Creatinine Clearance: 53.5 mL/min (by C-G formula based on SCr of 1.33 mg/dL (H)). Liver Function Tests:  Recent Labs Lab 08/25/16 2119 08/26/16 0436  AST 44* 33  ALT 16 13*  ALKPHOS 116 108  BILITOT 1.7* 1.7*  PROT 7.7 7.0  ALBUMIN 3.9 3.4*   No results for input(s): LIPASE, AMYLASE in the last 168 hours. No results for input(s): AMMONIA in the last 168 hours. Coagulation Profile: No results for input(s): INR, PROTIME in the last 168 hours. Cardiac Enzymes:  Recent Labs Lab 08/25/16 2119 08/26/16 0313 08/26/16 0919 08/26/16 1615  TROPONINI 0.05* 0.05* 0.04* 0.04*   BNP (last 3 results) No results for input(s): PROBNP in the last 8760 hours. HbA1C: No results for input(s): HGBA1C in the last 72 hours. CBG:  Recent Labs Lab 08/31/16 1205 08/31/16 1624 08/31/16 2048 09/01/16 0610 09/01/16 1131  GLUCAP 214* 242* 195* 235* 234*   Lipid Profile: No results for input(s): CHOL, HDL, LDLCALC, TRIG, CHOLHDL, LDLDIRECT in the last 72 hours. Thyroid Function Tests: No results for input(s): TSH, T4TOTAL, FREET4, T3FREE, THYROIDAB in the last 72 hours. Anemia Panel: No results for input(s): VITAMINB12, FOLATE, FERRITIN, TIBC, IRON, RETICCTPCT in the last 72 hours. Urine analysis:    Component Value Date/Time   COLORURINE RED (A) 04/15/2016  1733   APPEARANCEUR CLOUDY (A) 04/15/2016 1733   LABSPEC 1.009 04/15/2016 1733   PHURINE 7.0 04/15/2016 1733   GLUCOSEU NEGATIVE 04/15/2016 1733   GLUCOSEU NEGATIVE 10/10/2014 1649   HGBUR LARGE (A) 04/15/2016 1733   BILIRUBINUR NEGATIVE 04/15/2016 1733   KETONESUR NEGATIVE 04/15/2016 1733   PROTEINUR NEGATIVE 04/15/2016 1733   UROBILINOGEN 1.0 05/29/2015 1414   NITRITE NEGATIVE 04/15/2016 1733   LEUKOCYTESUR SMALL (A) 04/15/2016 1733   Sepsis Labs: @LABRCNTIP (procalcitonin:4,lacticidven:4)  )No results found for this or any previous visit (from the past 240 hour(s)).    Radiology Studies: No results found.   Scheduled Meds: . acetaZOLAMIDE  250 mg Oral BID  . aspirin EC  81 mg Oral Daily  . atorvastatin  40  mg Oral q1800  . enoxaparin (LOVENOX) injection  40 mg Subcutaneous Q24H  . gabapentin  300 mg Oral BID  . insulin aspart  0-15 Units Subcutaneous TID WC  . metolazone  5 mg Oral BID  . pantoprazole  40 mg Oral Daily  . polyethylene glycol  17 g Oral Daily  . potassium chloride  40 mEq Oral Once  . potassium chloride  60 mEq Oral BID  . sodium chloride flush  3 mL Intravenous Q12H  . spironolactone  12.5 mg Oral Daily   Continuous Infusions: . furosemide (LASIX) infusion 20 mg/hr (09/01/16 0025)  . milrinone 0.125 mcg/kg/min (08/31/16 1411)     LOS: 6 days   Time Spent in minutes   30 minutes  Christine Knight D.O. on 09/01/2016 at 11:55 AM  Between 7am to 7pm - Pager - [phone]  After 7pm go to [email or web address] - password [email or web address]  And look for the night coverage person covering for me after hours  [facility] Office  [phone]

## 2016-09-01 NOTE — Plan of Care (Signed)
Problem: Bowel/Gastric: Goal: Will not experience complications related to bowel motility Outcome: Completed/Met Date Met: 09/01/16 Patient with good results from Miralax and MOM

## 2016-09-02 ENCOUNTER — Encounter (HOSPITAL_COMMUNITY): Payer: Medicare Other

## 2016-09-02 LAB — BASIC METABOLIC PANEL
ANION GAP: 12 (ref 5–15)
BUN: 34 mg/dL — ABNORMAL HIGH (ref 6–20)
CALCIUM: 10.2 mg/dL (ref 8.9–10.3)
CO2: 35 mmol/L — AB (ref 22–32)
Chloride: 89 mmol/L — ABNORMAL LOW (ref 101–111)
Creatinine, Ser: 1.42 mg/dL — ABNORMAL HIGH (ref 0.44–1.00)
GFR calc non Af Amer: 38 mL/min — ABNORMAL LOW (ref >60)
GFR, EST AFRICAN AMERICAN: 44 mL/min — AB (ref >60)
Glucose, Bld: 261 mg/dL — ABNORMAL HIGH (ref 65–99)
Potassium: 3.6 mmol/L (ref 3.5–5.1)
SODIUM: 136 mmol/L (ref 135–145)

## 2016-09-02 LAB — GLUCOSE, CAPILLARY
GLUCOSE-CAPILLARY: 259 mg/dL — AB (ref 65–99)
GLUCOSE-CAPILLARY: 246 mg/dL — AB (ref 65–99)
Glucose-Capillary: 208 mg/dL — ABNORMAL HIGH (ref 65–99)
Glucose-Capillary: 152 mg/dL — ABNORMAL HIGH (ref 65–99)

## 2016-09-02 LAB — MAGNESIUM: MAGNESIUM: 1.8 mg/dL (ref 1.7–2.4)

## 2016-09-02 MED ORDER — MAGNESIUM SULFATE 2 GM/50ML IV SOLN
2.0000 g | Freq: Once | INTRAVENOUS | Status: AC
  Administered 2016-09-02: 2 g via INTRAVENOUS
  Filled 2016-09-02 (×2): qty 50

## 2016-09-02 NOTE — Progress Notes (Signed)
Advanced Heart Failure Rounding Note   Subjective:    Admitted with marked volume overload in setting of having to hold diuretics with marked AKI.   Brisk diuresis with lasix drip, diamox, milrinone, and metolazone. Weight down 10 pounds. No dyspnea.   Creatinine stable at 1.4. K 3.6   Objective:   Weight Range:  Vital Signs:   Temp:  [97.7 F (36.5 C)-98.1 F (36.7 C)] 98.1 F (36.7 C) (10/23 0425) Pulse Rate:  [68-76] 72 (10/23 0700) Resp:  [18-20] 18 (10/23 0700) BP: (92-110)/(53-69) 105/56 (10/23 0700) SpO2:  [94 %-99 %] 97 % (10/23 0700) Weight:  [237 lb 11.2 oz (107.8 kg)] 237 lb 11.2 oz (107.8 kg) (10/23 0425) Last BM Date: 09/01/16  Weight change: Filed Weights   08/31/16 1230 09/01/16 0500 09/02/16 0425  Weight: 253 lb 6.4 oz (114.9 kg) 247 lb (112 kg) 237 lb 11.2 oz (107.8 kg)    Intake/Output:   Intake/Output Summary (Last 24 hours) at 09/02/16 0752 Last data filed at 09/02/16 0700  Gross per 24 hour  Intake          1505.38 ml  Output             6775 ml  Net         -5269.62 ml     Physical Exam: General:  Seated in chair. NAD. HEENT: normal. On O2 via [place].  Neck: supple. JVP ~10. Carotids 2+ bilat; no bruits. No thyromegaly or lymphadenopathy. Cor: PMI nondisplaced. RRR. No M/G/R Lungs: Diminished throughout with mild crackles.  Abdomen: Morbidly obese, soft, NT, ND, no HSM. No bruits or masses. +BS  Extremities: no cyanosis, clubbing, rash.  1+ edema into thighs. UNNA boots on   Neuro: alert & orientedx3, cranial nerves grossly intact. moves all 4 extremities w/o difficulty. Affect pleasant  Telemetry: Reviewed, NSR  Labs: Basic Metabolic Panel:  Recent Labs Lab 08/28/16 0540 08/29/16 0505 08/30/16 0317 08/31/16 0328 09/01/16 0346 09/02/16 0432  NA 135 136 135 134* 136 136  K 3.7 3.4* 3.6 3.2* 3.5 3.6  CL 96* 95* 93* 90* 90* 89*  CO2 29 31 34* 34* 35* 35*  GLUCOSE 162* 125* 191* 180* 249* 261*  BUN 25* 25* 28* 30* 34* 34*    CREATININE 1.35* 1.26* 1.32* 1.37* 1.33* 1.42*  CALCIUM 9.2 9.4 9.2 9.5 9.9 10.2  MG 1.9  --   --   --   --  1.8    Liver Function Tests: No results for input(s): AST, ALT, ALKPHOS, BILITOT, PROT, ALBUMIN in the last 168 hours. No results for input(s): LIPASE, AMYLASE in the last 168 hours. No results for input(s): AMMONIA in the last 168 hours.  CBC:  Recent Labs Lab 08/28/16 0540 09/01/16 0346  WBC 3.9* 4.9  HGB 12.3 12.3  HCT 37.6 36.7  MCV 94.7 93.4  PLT 194 218    Cardiac Enzymes:  Recent Labs Lab 08/26/16 0919 08/26/16 1615  TROPONINI 0.04* 0.04*    BNP: BNP (last 3 results)  Recent Labs  04/11/16 1929 06/05/16 1230 08/25/16 2119  BNP 1,176.5* 906.9* 1,359.9*    ProBNP (last 3 results) No results for input(s): PROBNP in the last 8760 hours.    Other results:  Imaging: No results found.   Medications:     Scheduled Medications: . acetaZOLAMIDE  250 mg Oral BID  . aspirin EC  81 mg Oral Daily  . atorvastatin  40 mg Oral q1800  . enoxaparin (LOVENOX) injection  40  mg Subcutaneous Q24H  . gabapentin  300 mg Oral BID  . insulin aspart  0-15 Units Subcutaneous TID WC  . metolazone  5 mg Oral BID  . pantoprazole  40 mg Oral Daily  . polyethylene glycol  17 g Oral Daily  . potassium chloride  60 mEq Oral BID  . sodium chloride flush  3 mL Intravenous Q12H  . spironolactone  12.5 mg Oral Daily    Infusions: . furosemide (LASIX) infusion 20 mg/hr (09/01/16 1600)  . milrinone 0.125 mcg/kg/min (09/01/16 1600)    PRN Medications: acetaminophen, bisacodyl, magnesium hydroxide, ondansetron (ZOFRAN) IV, sodium chloride, traMADol   Assessment/Plan/Discussion:   1. Acute/Chronic systolic HF due to iCM EF 15-20% Echo 04/14/2016.Recent admit for low out put HF and diuresed over 50 pounds.Hospital course complicated by cardiorenal failure. ECHO 9/26 at the [facility].  EF 30-35% RV normal.  NYHA IIIb. - Brisk diuresis noted. Weight  trending down. Continue lasix gtt, milrinone, diamox and metolazone. Hopefully can transition to po tomorrow.   Previous d/c weight was 222# (she is 237 today - down from 268)  - No BB with hx of bradycardia. - Has not tolerated Entresto with elevated creatinine.  - Continue 12.5 mg spiro daily. Watch renal function carefully. - Intolerant bidil but difficulty with dizziness and hypotension.  - UNNA boots on 2. CAD s/p NSTEMI and CABG 10/12  - Severe native CAD with patent grafts on [facility] 12/13/15. Chest tightness on admit. Now resolved. Troponin 0.04 - Continue ASA 81 mg . Continue atorvastatin 40 mg daily.  3. Morbid obesity 4. Pulmonary HTN - mild to mod RHC 12/13/15. PVR 3.24.  5. Recent AKI on CKD stage III- Creatinine on 9/25 3.1.   - Continue to follow closely with diuresis. Stable thus far.  6. Hypokalemia - Todays K 3.6  Consult cardiac rehab.   Length of Stay: 7  [name] NP-C  09/02/2016, 7:52 AM Advanced Heart Failure Team Pager [phone] (M-F; 7a - 4p)  Please contact [facility] for night-coverage after hours (4p -7a ) and weekends on [email or web address]  Patient seen and examined with Tonye Becket, NP. We discussed all aspects of the encounter. I agree with the assessment and plan as stated above.   Diuresing well. Creatinine trending up slightly.K low. Will continue current regimen for today with lasix gtt, milrinone, metolazone and diamox.Watch renal closely.   Bensimhon, Daniel,MD 9:55 AM

## 2016-09-02 NOTE — Progress Notes (Signed)
CARDIAC REHAB PHASE I   Second attempt today to ambulate with pt. Pt declines ambulation at this time, states she received pain medication and is too sleepy to walk. Will re-attempt this afternoon as schedule permits, if not, will follow up tomorrow.  Joylene GrapesEmily C Suzette Flagler, RN, BSN 09/02/2016 1:39 PM

## 2016-09-02 NOTE — Progress Notes (Signed)
Patient is in chair asleep, States she feeling drowsy from pain med's. Controlled pain with tramadol. Up to bathroom with supervision. No Acute changed.

## 2016-09-02 NOTE — Progress Notes (Signed)
PROGRESS NOTE    Areej Tayler  ZOX:[id number] DOB: 1951/02/10 DOA: 08/25/2016 PCP: Roxanne Mins, PA-C   Chief Complaint  Patient presents with  . Chest Pain     Brief Narrative:  Rainie Mininallis a 65 y.o.femalewith medical history significant of CAD, NSTEMI, CABG in 08/2011, chronic combined diastolic/systolic CHF with EF of 30-35% last month, mitral regurgitation, type 2 diabetes, hyperlipidemia, hyperlipidemia, hypertension, morbid obesity, pulmonary hypertension who is coming to the ED with chest pain.   Per patient, she started having left sided CP non-radiated, pressure like and relieved by rest since yesterday. She denies dizziness, diaphoresis, palpitations, but complains of orthopnea and pitting edema lower extremities.She also mentions a30+ pound weight gain in the past few weeks, since she was taken off of her diuretics by cardiology. She has improved her EF from 15-20% to 30-35% in the last few months with cardiac rehab. She states that she has been compliant with diet and fluid intake.  Assessment & Plan  Acute on chronic combined systolic and diastolic congestive heart failure  -Cardiology/CHF team consulted and appreciated.  -Echocardiogram 9/26 EF 30-35% (at [facility]) -Continue lasix gtt, milrinone, metolazone, diamox, spironolactone -Urine output over past 24 hours 6275cc -Weight down from admission, 31 lbs  -Continue to monitor intake/output, daily weights -In the past, patient has been intolerant to Entresto and Bidil  Hyperlipidemia LDL goal <70 -Not on a statin. -Continue lifestyle modifications.  Type 2 diabetes, uncontrolled, with neuropathy (HCC) -Continue insulin sliding scale and CBG monitoring  Hypothyroidism -Not currently taking levothyroxine. -TSH 3.362  CKD (chronic kidney disease) stage 3, GFR 30-59 ml/min -Creatinine 1.42 -Continue to monitor BMP  Morbid obesity -encourage weight loss -going to cardiac  rehab  Hypokalemia -Secondary to diuresis -Continue supplementation and started on spironolactone -Monitor BMP  DVT Prophylaxis  Lovenox  Code Status: Full  Family Communication: None at bedside  Disposition Plan: Admitted, continue treatment of CHF. Home when stable.  Consultants Cardiology/CHF  Procedures  None  Antibiotics   Anti-infectives    None      Subjective:   Valda Laughridge seen and examined today.  Patient feels breathing is back to her baseline.    Denies chest pain, abdominal pain, nausea or vomiting, diarrhea or constipation.  Feels her legs are getting smaller.  Objective:   Vitals:   09/01/16 1946 09/02/16 0026 09/02/16 0425 09/02/16 0700  BP: 102/60 110/69 102/61 (!) 105/56  Pulse: 68 71 76 72  Resp: 18 18 18 18   Temp: 97.8 F (36.6 C) 97.7 F (36.5 C) 98.1 F (36.7 C) 98.2 F (36.8 C)  TempSrc: Oral Oral Oral   SpO2: 97% 94% 96% 97%  Weight:   107.8 kg (237 lb 11.2 oz)   Height:        Intake/Output Summary (Last 24 hours) at 09/02/16 1146 Last data filed at 09/02/16 1049  Gross per 24 hour  Intake          1385.38 ml  Output             5775 ml  Net         -4389.62 ml   Filed Weights   08/31/16 1230 09/01/16 0500 09/02/16 0425  Weight: 114.9 kg (253 lb 6.4 oz) 112 kg (247 lb) 107.8 kg (237 lb 11.2 oz)    Exam  General: Well developed, well nourished, no distress  HEENT: NCAT, mucous membranes moist.   Cardiovascular: S1 S2 auscultated, no murmurs, RRR  Respiratory: Diminished breath sounds  Abdomen: Soft, obese, nontender, nondistended, + bowel sounds  Extremities: warm dry without cyanosis clubbing. 2+ edema in LE. UNNA boots in place (edema improving)  Neuro: AAOx3, nonfocal  Psych: Normal affect and demeanor with intact judgement and insight, pleasant  Data Reviewed: I have personally reviewed following labs and imaging studies  CBC:  Recent Labs Lab 08/28/16 0540 09/01/16 0346  WBC 3.9* 4.9  HGB 12.3  12.3  HCT 37.6 36.7  MCV 94.7 93.4  PLT 194 218   Basic Metabolic Panel:  Recent Labs Lab 08/28/16 0540 08/29/16 0505 08/30/16 0317 08/31/16 0328 09/01/16 0346 09/02/16 0432  NA 135 136 135 134* 136 136  K 3.7 3.4* 3.6 3.2* 3.5 3.6  CL 96* 95* 93* 90* 90* 89*  CO2 29 31 34* 34* 35* 35*  GLUCOSE 162* 125* 191* 180* 249* 261*  BUN 25* 25* 28* 30* 34* 34*  CREATININE 1.35* 1.26* 1.32* 1.37* 1.33* 1.42*  CALCIUM 9.2 9.4 9.2 9.5 9.9 10.2  MG 1.9  --   --   --   --  1.8   GFR: Estimated Creatinine Clearance: 49.1 mL/min (by C-G formula based on SCr of 1.42 mg/dL (H)). Liver Function Tests: No results for input(s): AST, ALT, ALKPHOS, BILITOT, PROT, ALBUMIN in the last 168 hours. No results for input(s): LIPASE, AMYLASE in the last 168 hours. No results for input(s): AMMONIA in the last 168 hours. Coagulation Profile: No results for input(s): INR, PROTIME in the last 168 hours. Cardiac Enzymes:  Recent Labs Lab 08/26/16 1615  TROPONINI 0.04*   BNP (last 3 results) No results for input(s): PROBNP in the last 8760 hours. HbA1C: No results for input(s): HGBA1C in the last 72 hours. CBG:  Recent Labs Lab 09/01/16 0610 09/01/16 1131 09/01/16 1623 09/01/16 2112 09/02/16 0604  GLUCAP 235* 234* 200* 249* 259*   Lipid Profile: No results for input(s): CHOL, HDL, LDLCALC, TRIG, CHOLHDL, LDLDIRECT in the last 72 hours. Thyroid Function Tests: No results for input(s): TSH, T4TOTAL, FREET4, T3FREE, THYROIDAB in the last 72 hours. Anemia Panel: No results for input(s): VITAMINB12, FOLATE, FERRITIN, TIBC, IRON, RETICCTPCT in the last 72 hours. Urine analysis:    Component Value Date/Time   COLORURINE RED (A) 04/15/2016 1733   APPEARANCEUR CLOUDY (A) 04/15/2016 1733   LABSPEC 1.009 04/15/2016 1733   PHURINE 7.0 04/15/2016 1733   GLUCOSEU NEGATIVE 04/15/2016 1733   GLUCOSEU NEGATIVE 10/10/2014 1649   HGBUR LARGE (A) 04/15/2016 1733   BILIRUBINUR NEGATIVE 04/15/2016 1733    KETONESUR NEGATIVE 04/15/2016 1733   PROTEINUR NEGATIVE 04/15/2016 1733   UROBILINOGEN 1.0 05/29/2015 1414   NITRITE NEGATIVE 04/15/2016 1733   LEUKOCYTESUR SMALL (A) 04/15/2016 1733   Sepsis Labs: @LABRCNTIP (procalcitonin:4,lacticidven:4)  )No results found for this or any previous visit (from the past 240 hour(s)).    Radiology Studies: No results found.   Scheduled Meds: . acetaZOLAMIDE  250 mg Oral BID  . aspirin EC  81 mg Oral Daily  . atorvastatin  40 mg Oral q1800  . enoxaparin (LOVENOX) injection  40 mg Subcutaneous Q24H  . gabapentin  300 mg Oral BID  . insulin aspart  0-15 Units Subcutaneous TID WC  . metolazone  5 mg Oral BID  . pantoprazole  40 mg Oral Daily  . polyethylene glycol  17 g Oral Daily  . potassium chloride  60 mEq Oral BID  . sodium chloride flush  3 mL Intravenous Q12H  . spironolactone  12.5 mg Oral Daily   Continuous Infusions: .  furosemide (LASIX) infusion 20 mg/hr (09/01/16 1600)  . milrinone 0.125 mcg/kg/min (09/01/16 1600)     LOS: 7 days   Time Spent in minutes   30 minutes  Estalene Bergey D.O. on 09/02/2016 at 11:46 AM  Between 7am to 7pm - Pager - [phone]  After 7pm go to [email or web address] - password [email or web address]  And look for the night coverage person covering for me after hours  [facility] Office  [phone]

## 2016-09-02 NOTE — Progress Notes (Signed)
Inpatient Diabetes Program Recommendations  AACE/ADA: New Consensus Statement on Inpatient Glycemic Control (2015)  Target Ranges:  Prepandial:   less than 140 mg/dL      Peak postprandial:   less than 180 mg/dL (1-2 hours)      Critically ill patients:  140 - 180 mg/dL   Lab Results  Component Value Date   GLUCAP 208 (H) 09/02/2016   HGBA1C 8.3 (H) 12/09/2015    Review of Glycemic Control:  Results for [name], Christine (MRN [id number]) as of 09/02/2016 15:06  Ref. Range 09/01/2016 11:31 09/01/2016 16:23 09/01/2016 21:12 09/02/2016 06:04 09/02/2016 11:58  Glucose-Capillary Latest Ref Range: 65 - 99 mg/dL 409234 (H) 811200 (H) 914249 (H) 259 (H) 208 (H)   Diabetes history: Type 2 diabetes Outpatient Diabetes medications: None listed Current orders for Inpatient glycemic control:  Novolog moderate tid with meals  Inpatient Diabetes Program Recommendations:   Please check A1C to determine glycemic control.  Also consider adding Lantus 20 units daily.  Thanks, Beryl MeagerJenny Philamena Kramar, RN, BC-ADM Inpatient Diabetes Coordinator Pager [phone] (8a-5p)

## 2016-09-03 LAB — BASIC METABOLIC PANEL
Anion gap: 12 (ref 5–15)
BUN: 33 mg/dL — ABNORMAL HIGH (ref 6–20)
CHLORIDE: 84 mmol/L — AB (ref 101–111)
CO2: 35 mmol/L — AB (ref 22–32)
CREATININE: 1.44 mg/dL — AB (ref 0.44–1.00)
Calcium: 9.4 mg/dL (ref 8.9–10.3)
GFR calc non Af Amer: 37 mL/min — ABNORMAL LOW (ref >60)
GFR, EST AFRICAN AMERICAN: 43 mL/min — AB (ref >60)
Glucose, Bld: 210 mg/dL — ABNORMAL HIGH (ref 65–99)
POTASSIUM: 3.4 mmol/L — AB (ref 3.5–5.1)
SODIUM: 131 mmol/L — AB (ref 135–145)

## 2016-09-03 LAB — GLUCOSE, CAPILLARY
GLUCOSE-CAPILLARY: 245 mg/dL — AB (ref 65–99)
GLUCOSE-CAPILLARY: 309 mg/dL — AB (ref 65–99)
GLUCOSE-CAPILLARY: 186 mg/dL — AB (ref 65–99)
Glucose-Capillary: 293 mg/dL — ABNORMAL HIGH (ref 65–99)

## 2016-09-03 LAB — MAGNESIUM: MAGNESIUM: 2.3 mg/dL (ref 1.7–2.4)

## 2016-09-03 MED ORDER — SPIRONOLACTONE 25 MG PO TABS
25.0000 mg | ORAL_TABLET | Freq: Every day | ORAL | Status: DC
  Administered 2016-09-03 – 2016-09-08 (×6): 25 mg via ORAL
  Filled 2016-09-03 (×6): qty 1

## 2016-09-03 MED ORDER — INSULIN ASPART 100 UNIT/ML ~~LOC~~ SOLN
0.0000 [IU] | Freq: Every day | SUBCUTANEOUS | Status: DC
  Administered 2016-09-04 – 2016-09-06 (×2): 2 [IU] via SUBCUTANEOUS

## 2016-09-03 MED ORDER — INSULIN GLARGINE 100 UNIT/ML ~~LOC~~ SOLN
10.0000 [IU] | Freq: Every day | SUBCUTANEOUS | Status: DC
  Administered 2016-09-03 – 2016-09-08 (×6): 10 [IU] via SUBCUTANEOUS
  Filled 2016-09-03 (×7): qty 0.1

## 2016-09-03 NOTE — Progress Notes (Signed)
Patient is alert and oriented rounding with Np.not much weigh lost. So patient will be here a few more days for diureses.

## 2016-09-03 NOTE — Progress Notes (Signed)
PROGRESS NOTE    Christine Knight  ZOX:[id number] DOB: 1951-02-16 DOA: 08/25/2016 PCP: Roxanne Minsuran, [name], PA-C   Chief Complaint  Patient presents with  . Chest Pain     Brief Narrative:  Christine MccreedyBarbara Mininallis a 65 y.o.femalewith medical history significant of CAD, NSTEMI, CABG in 08/2011, chronic combined diastolic/systolic CHF with EF of 30-35% last month, mitral regurgitation, type 2 diabetes, hyperlipidemia, hyperlipidemia, hypertension, morbid obesity, pulmonary hypertension who is coming to the ED with chest pain.   Per patient, she started having left sided CP non-radiated, pressure like and relieved by rest since yesterday. She denies dizziness, diaphoresis, palpitations, but complains of orthopnea and pitting edema lower extremities.She also mentions a30+ pound weight gain in the past few weeks, since she was taken off of her diuretics by cardiology. She has improved her EF from 15-20% to 30-35% in the last few months with cardiac rehab. She states that she has been compliant with diet and fluid intake.  Patient placed on milrinone and lasix gtt.  Followed by HF team.  Assessment & Plan  Acute on chronic combined systolic and diastolic congestive heart failure  -Cardiology/CHF team consulted and appreciated.  -[facility] 9[facility] -Continue lasix gtt, milrinone, metolazone, diamox, spironolactone -Urine output over past 24 hours 4700cc -Weight down from admission, 33 lbs (still has about 10lbs to go) -Continue to monitor intake/output, daily weights -In the past, patient has been intolerant to Entresto and Bidil  Hyperlipidemia LDL goal <70 -Not on a statin. -Continue lifestyle modifications.  Type 2 diabetes, uncontrolled, with neuropathy (HCC) -Continue insulin sliding scale and CBG monitoring  Hypothyroidism -Not currently taking levothyroxine. -TSH 3.362  CKD (chronic kidney disease) stage 3, GFR 30-59 ml/min -Creatinine 1.44  (slight bump) -Continue to monitor BMP  Morbid obesity -encourage weight loss -going to cardiac rehab  Hypokalemia -Secondary to diuresis -Continue supplementation and spironolactone -Monitor BMP  DVT Prophylaxis  Lovenox  Code Status: Full  Family Communication: None at bedside  Disposition Plan: Admitted, continue treatment of CHF. Home when stable.  Consultants Cardiology/CHF  Procedures  None  Antibiotics   Anti-infectives    None      Subjective:   Christine Knight seen and examined today.  Patient feels breathing is back to her baseline.  Has no complaints this morning. Denies chest pain, abdominal pain, nausea or vomiting, diarrhea or constipation.    Objective:   Vitals:   09/02/16 1400 09/02/16 1553 09/02/16 1955 09/03/16 0522  BP: 99/79 105/67 (!) 110/59 110/62  Pulse: 64 67 67 72  Resp: 16 18 18 18   Temp: 97.6 F (36.4 C) 97.8 F (36.6 C) 98.1 F (36.7 C) 98.3 F (36.8 C)  TempSrc:  Oral Oral Oral  SpO2: 98% 97% 96% 94%  Weight:    107 kg (235 lb 14.4 oz)  Height:        Intake/Output Summary (Last 24 hours) at 09/03/16 1100 Last data filed at 09/03/16 1054  Gross per 24 hour  Intake              960 ml  Output             3800 ml  Net            -2840 ml   Filed Weights   09/01/16 0500 09/02/16 0425 09/03/16 0522  Weight: 112 kg (247 lb) 107.8 kg (237 lb 11.2 oz) 107 kg (235 lb 14.4 oz)    Exam  General: Well developed, well nourished,  no distress  HEENT: NCAT, mucous membranes moist.   Cardiovascular: S1 S2 auscultated, no murmurs, RRR  Respiratory: Diminished breath sounds  Abdomen: Soft, obese, nontender, nondistended, + bowel sounds  Extremities: warm dry without cyanosis clubbing. 1+ edema in LE. UNNA boots in place (edema improving)  Neuro: AAOx3, nonfocal  Psych: Appropriate mood and affect, very pleasant  Data Reviewed: I have personally reviewed following labs and imaging studies  CBC:  Recent Labs Lab  08/28/16 0540 09/01/16 0346  WBC 3.9* 4.9  HGB 12.3 12.3  HCT 37.6 36.7  MCV 94.7 93.4  PLT 194 218   Basic Metabolic Panel:  Recent Labs Lab 08/28/16 0540  08/30/16 0317 08/31/16 0328 09/01/16 0346 09/02/16 0432 09/03/16 0412  NA 135  < > 135 134* 136 136 131*  K 3.7  < > 3.6 3.2* 3.5 3.6 3.4*  CL 96*  < > 93* 90* 90* 89* 84*  CO2 29  < > 34* 34* 35* 35* 35*  GLUCOSE 162*  < > 191* 180* 249* 261* 210*  BUN 25*  < > 28* 30* 34* 34* 33*  CREATININE 1.35*  < > 1.32* 1.37* 1.33* 1.42* 1.44*  CALCIUM 9.2  < > 9.2 9.5 9.9 10.2 9.4  MG 1.9  --   --   --   --  1.8 2.3  < > = values in this interval not displayed. GFR: Estimated Creatinine Clearance: 48.2 mL/min (by C-G formula based on SCr of 1.44 mg/dL (H)). Liver Function Tests: No results for input(s): AST, ALT, ALKPHOS, BILITOT, PROT, ALBUMIN in the last 168 hours. No results for input(s): LIPASE, AMYLASE in the last 168 hours. No results for input(s): AMMONIA in the last 168 hours. Coagulation Profile: No results for input(s): INR, PROTIME in the last 168 hours. Cardiac Enzymes: No results for input(s): CKTOTAL, CKMB, CKMBINDEX, TROPONINI in the last 168 hours. BNP (last 3 results) No results for input(s): PROBNP in the last 8760 hours. HbA1C: No results for input(s): HGBA1C in the last 72 hours. CBG:  Recent Labs Lab 09/02/16 0604 09/02/16 1158 09/02/16 1649 09/02/16 2120 09/03/16 0554  GLUCAP 259* 208* 246* 152* 245*   Lipid Profile: No results for input(s): CHOL, HDL, LDLCALC, TRIG, CHOLHDL, LDLDIRECT in the last 72 hours. Thyroid Function Tests: No results for input(s): TSH, T4TOTAL, FREET4, T3FREE, THYROIDAB in the last 72 hours. Anemia Panel: No results for input(s): VITAMINB12, FOLATE, FERRITIN, TIBC, IRON, RETICCTPCT in the last 72 hours. Urine analysis:    Component Value Date/Time   COLORURINE RED (A) 04/15/2016 1733   APPEARANCEUR CLOUDY (A) 04/15/2016 1733   LABSPEC 1.009 04/15/2016 1733    PHURINE 7.0 04/15/2016 1733   GLUCOSEU NEGATIVE 04/15/2016 1733   GLUCOSEU NEGATIVE 10/10/2014 1649   HGBUR LARGE (A) 04/15/2016 1733   BILIRUBINUR NEGATIVE 04/15/2016 1733   KETONESUR NEGATIVE 04/15/2016 1733   PROTEINUR NEGATIVE 04/15/2016 1733   UROBILINOGEN 1.0 05/29/2015 1414   NITRITE NEGATIVE 04/15/2016 1733   LEUKOCYTESUR SMALL (A) 04/15/2016 1733   Sepsis Labs: @LABRCNTIP (procalcitonin:4,lacticidven:4)  )No results found for this or any previous visit (from the past 240 hour(s)).    Radiology Studies: No results found.   Scheduled Meds: . acetaZOLAMIDE  250 mg Oral BID  . aspirin EC  81 mg Oral Daily  . atorvastatin  40 mg Oral q1800  . enoxaparin (LOVENOX) injection  40 mg Subcutaneous Q24H  . gabapentin  300 mg Oral BID  . insulin aspart  0-15 Units Subcutaneous TID WC  .  metolazone  5 mg Oral BID  . pantoprazole  40 mg Oral Daily  . polyethylene glycol  17 g Oral Daily  . potassium chloride  60 mEq Oral BID  . sodium chloride flush  3 mL Intravenous Q12H  . spironolactone  25 mg Oral Daily   Continuous Infusions: . furosemide (LASIX) infusion 20 mg/hr (09/02/16 2013)  . milrinone 0.125 mcg/kg/min (09/02/16 2014)     LOS: 8 days   Time Spent in minutes   30 minutes  Danelle Curiale D.O. on 09/03/2016 at 11:00 AM  Between 7am to 7pm - Pager - [phone]  After 7pm go to [email or web address] - password [email or web address]  And look for the night coverage person covering for me after hours  [facility] Office  [phone]

## 2016-09-03 NOTE — Progress Notes (Signed)
Inpatient Diabetes Program Recommendations  AACE/ADA: New Consensus Statement on Inpatient Glycemic Control (2015)  Target Ranges:  Prepandial:   less than 140 mg/dL      Peak postprandial:   less than 180 mg/dL (1-2 hours)      Critically ill patients:  140 - 180 mg/dL   Results for [name], Christine (MRN [id number]) as of 09/03/2016 11:13  Ref. Range 09/02/2016 06:04 09/02/2016 11:58 09/02/2016 16:49 09/02/2016 21:20 09/03/2016 05:54  Glucose-Capillary Latest Ref Range: 65 - 99 mg/dL 409259 (H) 811208 (H) 914246 (H) 152 (H) 245 (H)   Review of Glycemic Control  Diabetes history: DM2 Outpatient Diabetes medications: Knight listed on home medication list Current orders for Inpatient glycemic control: Novolog 0-15 units TID with meals  Inpatient Diabetes Program Recommendations:  Insulin - Basal: Glucose ranged from 152-259 mg/dl on 78/29/5610/23/17 and patient received a total of Novolog 15 units for correction on 09/02/16. Fasting glucose 245 mg/dl this morning. Please consider ordering low dose basal insulin; recommend ordering Lantus 10 units Q24H starting now (based on 107 kg x 0.1 units). Correction (SSI): Please consider ordering Novolog bedtime correction scale. HgbA1C: A1C ordered. Outpatient DM medications: No DM medications listed on home medication list. However, in reviewing chart noted patient use to be on oral DM medicaitons. If patient is not taking oral DM medication as an outpatient currently, likely needs to be discharged on DM medications.  Thanks, Orlando PennerMarie Kayron Hicklin, RN, MSN, CDE Diabetes Coordinator Inpatient Diabetes Program [phone] (Team Pager from 8am to 5pm) [phone] ([facility] office) [phone] [facility] office) [phone] [facility] office)'

## 2016-09-03 NOTE — Care Management Important Message (Signed)
Important Message  Patient Details  Name: Lum BabeBarbara Davalos MRN: [id number] Date of Birth: 1951/03/12   Medicare Important Message Given:  Yes    Reta Norgren Stefan ChurchBratton 09/03/2016, 12:57 PM

## 2016-09-03 NOTE — Progress Notes (Signed)
CARDIAC REHAB PHASE I   PRE:  Rate/Rhythm:83 SR c/ PVCs  BP:  Sitting: 120/82        SaO2: 96 RA  MODE:  Ambulation: 800 ft   POST:  Rate/Rhythm: 94 SR c/ PVCs  BP:  Sitting: 135/82         SaO2: 93 RA  Pt up ad lib in room, eager to ambulate today. Pt ambulated 800 ft on RA, IV, independent, steady gait, tolerated well with no complaints. Pt was in phase 2 cardiac rehab, will need written permission from MD to return. Pt verbalized interest in continuing in program. Pt to recliner after walk, feet elevated, call bell within reach. Encouraged additional ambulation as tolerated. Will follow.   [phone] Joylene GrapesEmily C Annamae Shivley, RN, BSN 09/03/2016 11:27 AM

## 2016-09-03 NOTE — Progress Notes (Signed)
Advanced Heart Failure Rounding Note   Subjective:    Admitted with marked volume overload in setting of having to hold diuretics with marked AKI.   Brisk diuresis with lasix drip, diamox, milrinone, and metolazone. Weight down another 2 pounds.  Denies SOB    Creatinine stable at 1.4. K 3.4   Objective:   Weight Range:  Vital Signs:   Temp:  [97.6 F (36.4 C)-98.3 F (36.8 C)] 98.3 F (36.8 C) (10/24 0522) Pulse Rate:  [64-72] 72 (10/24 0522) Resp:  [16-18] 18 (10/24 0522) BP: (99-110)/(59-79) 110/62 (10/24 0522) SpO2:  [94 %-98 %] 94 % (10/24 0522) Weight:  [235 lb 14.4 oz (107 kg)] 235 lb 14.4 oz (107 kg) (10/24 0522) Last BM Date: 09/01/16  Weight change: Filed Weights   09/01/16 0500 09/02/16 0425 09/03/16 0522  Weight: 247 lb (112 kg) 237 lb 11.2 oz (107.8 kg) 235 lb 14.4 oz (107 kg)    Intake/Output:   Intake/Output Summary (Last 24 hours) at 09/03/16 0833 Last data filed at 09/03/16 0523  Gross per 24 hour  Intake              960 ml  Output             4200 ml  Net            -3240 ml     Physical Exam: General:  Seated in chair. NAD. HEENT: normal. On O2 via [place].  Neck: supple. JVP ~10-11. Carotids 2+ bilat; no bruits. No thyromegaly or lymphadenopathy. Cor: PMI nondisplaced. RRR. No M/G/R Lungs: Diminished throughout with mild crackles.  Abdomen: Morbidly obese, soft, NT, ND, no HSM. No bruits or masses. +BS  Extremities: no cyanosis, clubbing, rash.  1+ edema into thighs. UNNA boots on   Neuro: alert & orientedx3, cranial nerves grossly intact. moves all 4 extremities w/o difficulty. Affect pleasant  Telemetry: Reviewed, NSR  Labs: Basic Metabolic Panel:  Recent Labs Lab 08/28/16 0540  08/30/16 0317 08/31/16 0328 09/01/16 0346 09/02/16 0432 09/03/16 0412  NA 135  < > 135 134* 136 136 131*  K 3.7  < > 3.6 3.2* 3.5 3.6 3.4*  CL 96*  < > 93* 90* 90* 89* 84*  CO2 29  < > 34* 34* 35* 35* 35*  GLUCOSE 162*  < > 191* 180* 249* 261*  210*  BUN 25*  < > 28* 30* 34* 34* 33*  CREATININE 1.35*  < > 1.32* 1.37* 1.33* 1.42* 1.44*  CALCIUM 9.2  < > 9.2 9.5 9.9 10.2 9.4  MG 1.9  --   --   --   --  1.8 2.3  < > = values in this interval not displayed.  Liver Function Tests: No results for input(s): AST, ALT, ALKPHOS, BILITOT, PROT, ALBUMIN in the last 168 hours. No results for input(s): LIPASE, AMYLASE in the last 168 hours. No results for input(s): AMMONIA in the last 168 hours.  CBC:  Recent Labs Lab 08/28/16 0540 09/01/16 0346  WBC 3.9* 4.9  HGB 12.3 12.3  HCT 37.6 36.7  MCV 94.7 93.4  PLT 194 218    Cardiac Enzymes: No results for input(s): CKTOTAL, CKMB, CKMBINDEX, TROPONINI in the last 168 hours.  BNP: BNP (last 3 results)  Recent Labs  04/11/16 1929 06/05/16 1230 08/25/16 2119  BNP 1,176.5* 906.9* 1,359.9*    ProBNP (last 3 results) No results for input(s): PROBNP in the last 8760 hours.    Other results:  Imaging: No  results found.   Medications:     Scheduled Medications: . acetaZOLAMIDE  250 mg Oral BID  . aspirin EC  81 mg Oral Daily  . atorvastatin  40 mg Oral q1800  . enoxaparin (LOVENOX) injection  40 mg Subcutaneous Q24H  . gabapentin  300 mg Oral BID  . insulin aspart  0-15 Units Subcutaneous TID WC  . metolazone  5 mg Oral BID  . pantoprazole  40 mg Oral Daily  . polyethylene glycol  17 g Oral Daily  . potassium chloride  60 mEq Oral BID  . sodium chloride flush  3 mL Intravenous Q12H  . spironolactone  12.5 mg Oral Daily    Infusions: . furosemide (LASIX) infusion 20 mg/hr (09/02/16 2013)  . milrinone 0.125 mcg/kg/min (09/02/16 2014)    PRN Medications: acetaminophen, bisacodyl, magnesium hydroxide, ondansetron (ZOFRAN) IV, sodium chloride, traMADol   Assessment/Plan/Discussion:   1. Acute/Chronic systolic HF due to iCM EF 15-20% Echo 04/14/2016.Recent admit for low out put HF and diuresed over 50 pounds.Hospital course complicated by cardiorenal failure. ECHO  9/26 at the [facility].  EF 30-35% RV normal.  NYHA IIIb. - Brisk diuresis noted. Weight trending down. Continue lasix gtt, milrinone, diamox and metolazone.    Previous d/c weight was 222# (she is 235 today - down from 268)  - No BB with hx of bradycardia. - Has not tolerated Entresto with elevated creatinine.  - Increase spiro to 25 mg daily.  Watch renal function carefully. - Intolerant bidil but difficulty with dizziness and hypotension.  - UNNA boots on 2. CAD s/p NSTEMI and CABG 10/12  - Severe native CAD with patent grafts on [facility] 12/13/15. Chest tightness on admit. Now resolved. Troponin 0.04 - Continue ASA 81 mg . Continue atorvastatin 40 mg daily.  3. Morbid obesity 4. Pulmonary HTN - mild to mod RHC 12/13/15. PVR 3.24.  5. Recent AKI on CKD stage III- Creatinine on 9/25 3.1.   - Continue to follow closely with diuresis. Stable thus far.  6. Hypokalemia - Todays K 3.4. Increase spiro as above.    Consult cardiac rehab. Ambulate today.  Length of Stay: 8  [name] NP-C  09/03/2016, 8:33 AM Advanced Heart Failure Team Pager [phone] (M-F; 7a - 4p)  Please contact [facility] for night-coverage after hours (4p -7a ) and weekends on [email or web address]  Patient seen and examined with Tonye Becket, NP. We discussed all aspects of the encounter. I agree with the assessment and plan as stated above.   Weight continues to trend down. Renal function stable. Still about 10 pounds from baseline weight. Continue to diurese with milrinone support. Supp K+.   Thomasena Vandenheuvel,MD 8:52 AM

## 2016-09-04 ENCOUNTER — Encounter (HOSPITAL_COMMUNITY): Payer: Medicare Other

## 2016-09-04 DIAGNOSIS — E876 Hypokalemia: Secondary | ICD-10-CM

## 2016-09-04 DIAGNOSIS — E1140 Type 2 diabetes mellitus with diabetic neuropathy, unspecified: Secondary | ICD-10-CM

## 2016-09-04 DIAGNOSIS — E039 Hypothyroidism, unspecified: Secondary | ICD-10-CM

## 2016-09-04 DIAGNOSIS — E1165 Type 2 diabetes mellitus with hyperglycemia: Secondary | ICD-10-CM

## 2016-09-04 LAB — GLUCOSE, CAPILLARY
GLUCOSE-CAPILLARY: 226 mg/dL — AB (ref 65–99)
Glucose-Capillary: 272 mg/dL — ABNORMAL HIGH (ref 65–99)
Glucose-Capillary: 187 mg/dL — ABNORMAL HIGH (ref 65–99)
Glucose-Capillary: 201 mg/dL — ABNORMAL HIGH (ref 65–99)

## 2016-09-04 LAB — BASIC METABOLIC PANEL
ANION GAP: 15 (ref 5–15)
BUN: 41 mg/dL — ABNORMAL HIGH (ref 6–20)
CO2: 34 mmol/L — ABNORMAL HIGH (ref 22–32)
Calcium: 9.8 mg/dL (ref 8.9–10.3)
Chloride: 84 mmol/L — ABNORMAL LOW (ref 101–111)
Creatinine, Ser: 1.6 mg/dL — ABNORMAL HIGH (ref 0.44–1.00)
GFR, EST AFRICAN AMERICAN: 38 mL/min — AB (ref >60)
GFR, EST NON AFRICAN AMERICAN: 33 mL/min — AB (ref >60)
Glucose, Bld: 208 mg/dL — ABNORMAL HIGH (ref 65–99)
POTASSIUM: 3.4 mmol/L — AB (ref 3.5–5.1)
SODIUM: 133 mmol/L — AB (ref 135–145)

## 2016-09-04 LAB — HEMOGLOBIN A1C
Hgb A1c MFr Bld: 7 % — ABNORMAL HIGH (ref 4.8–5.6)
MEAN PLASMA GLUCOSE: 154 mg/dL

## 2016-09-04 MED ORDER — POTASSIUM CHLORIDE CRYS ER 20 MEQ PO TBCR
60.0000 meq | EXTENDED_RELEASE_TABLET | Freq: Every day | ORAL | Status: DC
  Administered 2016-09-05 – 2016-09-07 (×3): 60 meq via ORAL
  Filled 2016-09-04 (×3): qty 3

## 2016-09-04 MED ORDER — POTASSIUM CHLORIDE CRYS ER 20 MEQ PO TBCR
40.0000 meq | EXTENDED_RELEASE_TABLET | Freq: Once | ORAL | Status: AC
  Administered 2016-09-04: 40 meq via ORAL
  Filled 2016-09-04: qty 2

## 2016-09-04 NOTE — Progress Notes (Signed)
PROGRESS NOTE    Christine BabeBarbara Knight  ZOX:[id number] DOB: 07-22-1951 DOA: 08/25/2016 PCP: Roxanne Minsuran, [name], PA-C    Brief Narrative:  65 yo female with ischemic cardiomyopathy, systolic heart failure with ef 35% and ckd stage 3. Presents with chest pain and 30 lbs weight increase.  Found to be in heart failure. Started on milrinone drip and furosemide drip. Negative fluid balance.    Assessment & Plan:   Principal Problem:   Acute on chronic combined systolic and diastolic congestive heart failure (HCC) Active Problems:   Hyperlipidemia LDL goal <70   Type 2 diabetes, uncontrolled, with neuropathy (HCC)   Hypothyroidism   CKD (chronic kidney disease) stage 3, GFR 30-59 ml/min   CHF (congestive heart failure) (HCC)   1. Heart failure decompensation. Systolic heart failure, ischemic, acute on chronic. Will continue inotropic support with milrinone and diuresis with furosemide drip. Will continue asa and statin therapy. Continue metolazone bid plus aldactone. Urine output up to 6000 ml. Systolic blood pressure 96 to 811110. Holding b blockade and ace inh for now, while patient on milrinone.  2. CKD stage 3. Worsening renal function with cr up to 1,6 from 1,4, will continue to monitor renal function and electrolytes. K 3,4. Patient on 4 diuretics, furosemide, metolazone, acetazolimide and aldactone, will consider decrease diuretics to prevent worsening renal function.   3. T2DM. Will continue iss for glucose cover and monitoring, serum glucose [phone]. Will continue basal insulin with glargine.   4. Hypothyroidism. Not on levothyroxine will check tsh and free t4..      DVT prophylaxis: lovenox Code Status: full  Family Communication: No family at the bedside  Disposition Plan: home   Consultants:   Heart failure   Procedures:   Antimicrobials:  Subjective: Patient feeling very weak and deconditioned, dyspnea has improved, continue lower extremity edema, no chest pain, no  nausea or vomiting. Out of bed to the restroom. Continue on milrinone drip and furosemide drip.   Objective: Vitals:   09/03/16 1800 09/03/16 2235 09/04/16 0006 09/04/16 0613  BP: 101/60 112/65 (!) 96/53 (!) 106/57  Pulse: 61 71 72 73  Resp:  18 18 18   Temp: 97.9 F (36.6 C) 98.4 F (36.9 C) 98.4 F (36.9 C) 98.4 F (36.9 C)  TempSrc: Oral Oral Oral Oral  SpO2: 97% 98% 100% 95%  Weight:    105.5 kg (232 lb 8 oz)  Height:        Intake/Output Summary (Last 24 hours) at 09/04/16 1033 Last data filed at 09/04/16 1025  Gross per 24 hour  Intake           2168.4 ml  Output             5400 ml  Net          -3231.6 ml   Filed Weights   09/02/16 0425 09/03/16 0522 09/04/16 0613  Weight: 107.8 kg (237 lb 11.2 oz) 107 kg (235 lb 14.4 oz) 105.5 kg (232 lb 8 oz)    Examination:  General exam: deconditioned E ENT:  Mild conjunctival pallor, oral mucosa dry.   Respiratory system: Mils rales at bases, no wheezing or rhonchi.  Respiratory effort normal. Cardiovascular system: S1 & S2 heard, RRR. Moderate  JVD, murmurs, rubs, gallops or clicks.  Psotive compression bandage bilateral, noted pitting edema ++. Gastrointestinal system: Abdomen is nondistended, soft and nontender. No organomegaly or masses felt. Normal bowel sounds heard. Central nervous system: Alert and oriented. No focal neurological deficits. Extremities: Symmetric 5 x  5 power. Skin: No rashes, lesions or ulcers    Data Reviewed: I have personally reviewed following labs and imaging studies  CBC:  Recent Labs Lab 09/01/16 0346  WBC 4.9  HGB 12.3  HCT 36.7  MCV 93.4  PLT 218   Basic Metabolic Panel:  Recent Labs Lab 08/31/16 0328 09/01/16 0346 09/02/16 0432 09/03/16 0412 09/04/16 0451  NA 134* 136 136 131* 133*  K 3.2* 3.5 3.6 3.4* 3.4*  CL 90* 90* 89* 84* 84*  CO2 34* 35* 35* 35* 34*  GLUCOSE 180* 249* 261* 210* 208*  BUN 30* 34* 34* 33* 41*  CREATININE 1.37* 1.33* 1.42* 1.44* 1.60*  CALCIUM  9.5 9.9 10.2 9.4 9.8  MG  --   --  1.8 2.3  --    GFR: Estimated Creatinine Clearance: 43.1 mL/min (by C-G formula based on SCr of 1.6 mg/dL (H)). Liver Function Tests: No results for input(s): AST, ALT, ALKPHOS, BILITOT, PROT, ALBUMIN in the last 168 hours. No results for input(s): LIPASE, AMYLASE in the last 168 hours. No results for input(s): AMMONIA in the last 168 hours. Coagulation Profile: No results for input(s): INR, PROTIME in the last 168 hours. Cardiac Enzymes: No results for input(s): CKTOTAL, CKMB, CKMBINDEX, TROPONINI in the last 168 hours. BNP (last 3 results) No results for input(s): PROBNP in the last 8760 hours. HbA1C:  Recent Labs  09/03/16 1130  HGBA1C 7.0*   CBG:  Recent Labs Lab 09/03/16 0554 09/03/16 1136 09/03/16 1611 09/03/16 2209 09/04/16 0610  GLUCAP 245* 293* 309* 186* 226*   Lipid Profile: No results for input(s): CHOL, HDL, LDLCALC, TRIG, CHOLHDL, LDLDIRECT in the last 72 hours. Thyroid Function Tests: No results for input(s): TSH, T4TOTAL, FREET4, T3FREE, THYROIDAB in the last 72 hours. Anemia Panel: No results for input(s): VITAMINB12, FOLATE, FERRITIN, TIBC, IRON, RETICCTPCT in the last 72 hours. Sepsis Labs: No results for input(s): PROCALCITON, LATICACIDVEN in the last 168 hours.  No results found for this or any previous visit (from the past 240 hour(s)).       Radiology Studies: No results found.      Scheduled Meds: . acetaZOLAMIDE  250 mg Oral BID  . aspirin EC  81 mg Oral Daily  . atorvastatin  40 mg Oral q1800  . enoxaparin (LOVENOX) injection  40 mg Subcutaneous Q24H  . gabapentin  300 mg Oral BID  . insulin aspart  0-15 Units Subcutaneous TID WC  . insulin aspart  0-5 Units Subcutaneous QHS  . insulin glargine  10 Units Subcutaneous Daily  . metolazone  5 mg Oral BID  . pantoprazole  40 mg Oral Daily  . polyethylene glycol  17 g Oral Daily  . potassium chloride  60 mEq Oral BID  . sodium chloride flush  3  mL Intravenous Q12H  . spironolactone  25 mg Oral Daily   Continuous Infusions: . furosemide (LASIX) infusion 20 mg/hr (09/04/16 0059)  . milrinone 0.125 mcg/kg/min (09/03/16 1912)     LOS: 9 days        Everette Dimauro Annett Gula, MD [facility] Pager [phone]  If 7PM-7AM, please contact night-coverage [email or web address] Password [email or web address] 09/04/2016, 10:33 AM

## 2016-09-04 NOTE — Progress Notes (Signed)
Patient alarmed 10 beats v-tach.  Patient asymptomatic and sitting in the chair.  Mardelle MatteAndy, [place]PA made aware.  Will continue to monitor.

## 2016-09-04 NOTE — Progress Notes (Signed)
Advanced Heart Failure Rounding Note   Subjective:    Admitted with marked volume overload in setting of having to hold diuretics with marked AKI.   Brisk diuresis with lasix drip, diamox, milrinone, and metolazone. Weight down another 3 pounds.  Denies SOB . Able to walk in hall   Creatinine trending up 1.4>1.6  K 3.4   Objective:   Weight Range:  Vital Signs:   Temp:  [97.9 F (36.6 C)-98.4 F (36.9 C)] 98.4 F (36.9 C) (10/25 66440613) Pulse Rate:  [61-73] 73 (10/25 0613) Resp:  [18] 18 (10/25 0613) BP: (96-112)/(53-65) 106/57 (10/25 0613) SpO2:  [95 %-100 %] 95 % (10/25 03470613) Weight:  [232 lb 8 oz (105.5 kg)] 232 lb 8 oz (105.5 kg) (10/25 0613) Last BM Date: 09/03/16  Weight change: Filed Weights   09/02/16 0425 09/03/16 0522 09/04/16 [id number]  Weight: 237 lb 11.2 oz (107.8 kg) 235 lb 14.4 oz (107 kg) 232 lb 8 oz (105.5 kg)    Intake/Output:   Intake/Output Summary (Last 24 hours) at 09/04/16 0753 Last data filed at 09/04/16 [id number]  Gross per 24 hour  Intake           1968.4 ml  Output             4500 ml  Net          -2531.6 ml     Physical Exam: General:  Seated in chair. NAD. HEENT: normal. On O2 via [place].  Neck: supple. JVP ~10. Carotids 2+ bilat; no bruits. No thyromegaly or lymphadenopathy. Cor: PMI nondisplaced. RRR. No M/G/R Lungs: Diminished throughout with mild crackles.  Abdomen: Morbidly obese, soft, NT, ND, no HSM. No bruits or masses. +BS  Extremities: no cyanosis, clubbing, rash.  Rand LLE 1+ edema.  UNNA boots on   Neuro: alert & orientedx3, cranial nerves grossly intact. moves all 4 extremities w/o difficulty. Affect pleasant  Telemetry: Reviewed, NSR  Labs: Basic Metabolic Panel:  Recent Labs Lab 08/31/16 0328 09/01/16 0346 09/02/16 0432 09/03/16 0412 09/04/16 0451  NA 134* 136 136 131* 133*  K 3.2* 3.5 3.6 3.4* 3.4*  CL 90* 90* 89* 84* 84*  CO2 34* 35* 35* 35* 34*  GLUCOSE 180* 249* 261* 210* 208*  BUN 30* 34* 34* 33* 41*    CREATININE 1.37* 1.33* 1.42* 1.44* 1.60*  CALCIUM 9.5 9.9 10.2 9.4 9.8  MG  --   --  1.8 2.3  --     Liver Function Tests: No results for input(s): AST, ALT, ALKPHOS, BILITOT, PROT, ALBUMIN in the last 168 hours. No results for input(s): LIPASE, AMYLASE in the last 168 hours. No results for input(s): AMMONIA in the last 168 hours.  CBC:  Recent Labs Lab 09/01/16 0346  WBC 4.9  HGB 12.3  HCT 36.7  MCV 93.4  PLT 218    Cardiac Enzymes: No results for input(s): CKTOTAL, CKMB, CKMBINDEX, TROPONINI in the last 168 hours.  BNP: BNP (last 3 results)  Recent Labs  04/11/16 1929 06/05/16 1230 08/25/16 2119  BNP 1,176.5* 906.9* 1,359.9*    ProBNP (last 3 results) No results for input(s): PROBNP in the last 8760 hours.    Other results:  Imaging: No results found.   Medications:     Scheduled Medications: . acetaZOLAMIDE  250 mg Oral BID  . aspirin EC  81 mg Oral Daily  . atorvastatin  40 mg Oral q1800  . enoxaparin (LOVENOX) injection  40 mg Subcutaneous Q24H  . gabapentin  300 mg Oral BID  . insulin aspart  0-15 Units Subcutaneous TID WC  . insulin aspart  0-5 Units Subcutaneous QHS  . insulin glargine  10 Units Subcutaneous Daily  . metolazone  5 mg Oral BID  . pantoprazole  40 mg Oral Daily  . polyethylene glycol  17 g Oral Daily  . potassium chloride  60 mEq Oral BID  . sodium chloride flush  3 mL Intravenous Q12H  . spironolactone  25 mg Oral Daily    Infusions: . furosemide (LASIX) infusion 20 mg/hr (09/04/16 0059)  . milrinone 0.125 mcg/kg/min (09/03/16 1912)    PRN Medications: acetaminophen, bisacodyl, magnesium hydroxide, ondansetron (ZOFRAN) IV, sodium chloride, traMADol   Assessment/Plan/Discussion:   1. Acute/Chronic systolic HF due to iCM EF 15-20% Echo 04/14/2016.Recent admit for low out put HF and diuresed over 50 pounds.Hospital course complicated by cardiorenal failure. ECHO 9/26 at the [facility].  EF 30-35% RV normal.   NYHA IIIb. - Brisk diuresis noted. Weight trending down. Continue lasix gtt, milrinone, diamox and metolazone.    Previous d/c weight was 222# (she is 232 today - down from 268)  - No BB with hx of bradycardia. - Has not tolerated Entresto with elevated creatinine.  - Continue spiro to 25 mg daily.  Watch renal function carefully. Creatinine trending up 1.4>1.6  - Intolerant bidil but difficulty with dizziness and hypotension.  - UNNA boots on 2. CAD s/p NSTEMI and CABG 10/12  - Severe native CAD with patent grafts on [facility] 12/13/15. Chest tightness on admit. Now resolved. Troponin 0.04 - Continue ASA 81 mg . Continue atorvastatin 40 mg daily.  3. Morbid obesity 4. Pulmonary HTN - mild to mod RHC 12/13/15. PVR 3.24.  5. AKI on CKD stage III- Creatinine on 9/25 3.1.   - Continue to follow closely with diuresis. Stable thus far.  6. Hypokalemia - Todays K 3.4. Supplement potassium.    One more day of IV diuresis.  Consult cardiac rehab. Ambulate today.  Length of Stay: 9  [name] NP-C  09/04/2016, 7:53 AM Advanced Heart Failure Team Pager [phone] (M-F; 7a - 4p)  Please contact [facility] for night-coverage after hours (4p -7a ) and weekends on [email or web address]  Patient seen and examined with Tonye Becket, NP. We discussed all aspects of the encounter. I agree with the assessment and plan as stated above.   Volume status getting close to baseline. Creatinine up slightly. Will continue milrinone and current diuretic regimen at least one more day. Supp K. Watch renal function closely. Ambulate.   Bensimhon, Daniel,MD 8:11 AM

## 2016-09-04 NOTE — Progress Notes (Signed)
CARDIAC REHAB PHASE I   PRE:  Rate/Rhythm: 76 SR PVCs  BP:  Supine:   Sitting: 116/62  Standing:    SaO2: 94%RA  MODE:  Ambulation: 900 ft   POST:  Rate/Rhythm: 96 SR PVCs  BP:  Supine:   Sitting: 110/60  Standing:    SaO2: 90-91%RA 1040-1120 Pt stated she was up last night going to bathroom so she has not had much sleep. Also having some arthritic pain but glad to walk. Walked 900 ft independently with me managing IV pole. Tolerated well. Stopped once to rest.   Luetta [name] Yoshiye Kraft, RN BSN  09/04/2016 11:16 AM

## 2016-09-04 NOTE — Progress Notes (Signed)
Inpatient Diabetes Program Recommendations  AACE/ADA: New Consensus Statement on Inpatient Glycemic Control (2015)  Target Ranges:  Prepandial:   less than 140 mg/dL      Peak postprandial:   less than 180 mg/dL (1-2 hours)      Critically ill patients:  140 - 180 mg/dL   Results for Christine Knight, Christine Knight (MRN [id number]) as of 09/04/2016 10:22  Ref. Range 09/03/2016 05:54 09/03/2016 11:36 09/03/2016 16:11 09/03/2016 22:09 09/04/2016 06:10  Glucose-Capillary Latest Ref Range: 65 - 99 mg/dL 147245 (H) 829293 (H) 562309 (H) 186 (H) 226 (H)   Review of Glycemic Control  Diabetes history: DM2 Outpatient Diabetes medications: None listed on home medication list Current orders for Inpatient glycemic control: Novolog 0-15 units TID with meals, Novolog 0-5 units QHS, Lantus 10 units daily  Inpatient Diabetes Program Recommendations:  Insulin - Basal: Please consider increasing Lantus to 12 units daily. Insulin-Meal Coverage: If patient is eating at least 50% of meals, please consider ordering Novolog 3 units TID with meals for meal coverage. Outpatient DM medications: No DM medications listed on home medication list. However, in reviewing chart noted patient use to be on oral DM medicaitons. If patient is not taking oral DM medication as an outpatient currently, likely needs to be discharged on DM medications.  Thanks, Orlando PennerMarie Vitor Overbaugh, RN, MSN, CDE Diabetes Coordinator Inpatient Diabetes Program [phone] (Team Pager from 8am to 5pm) [phone] ([facility] office) [phone] [facility] office) [phone] [facility] office)

## 2016-09-05 ENCOUNTER — Encounter: Payer: Self-pay | Admitting: Internal Medicine

## 2016-09-05 DIAGNOSIS — I5023 Acute on chronic systolic (congestive) heart failure: Secondary | ICD-10-CM

## 2016-09-05 LAB — TSH: TSH: 5.542 u[IU]/mL — AB (ref 0.350–4.500)

## 2016-09-05 LAB — BASIC METABOLIC PANEL
Anion gap: 14 (ref 5–15)
Anion gap: 13 (ref 5–15)
BUN: 44 mg/dL — ABNORMAL HIGH (ref 6–20)
BUN: 45 mg/dL — AB (ref 6–20)
CALCIUM: 10 mg/dL (ref 8.9–10.3)
CHLORIDE: 82 mmol/L — AB (ref 101–111)
CHLORIDE: 82 mmol/L — AB (ref 101–111)
CO2: 36 mmol/L — ABNORMAL HIGH (ref 22–32)
CO2: 35 mmol/L — AB (ref 22–32)
CREATININE: 1.52 mg/dL — AB (ref 0.44–1.00)
CREATININE: 1.59 mg/dL — AB (ref 0.44–1.00)
Calcium: 9.9 mg/dL (ref 8.9–10.3)
GFR calc Af Amer: 38 mL/min — ABNORMAL LOW (ref >60)
GFR calc non Af Amer: 35 mL/min — ABNORMAL LOW (ref >60)
GFR calc non Af Amer: 33 mL/min — ABNORMAL LOW (ref >60)
GFR, EST AFRICAN AMERICAN: 40 mL/min — AB (ref >60)
GLUCOSE: 191 mg/dL — AB (ref 65–99)
Glucose, Bld: 159 mg/dL — ABNORMAL HIGH (ref 65–99)
Potassium: 2.7 mmol/L — CL (ref 3.5–5.1)
Potassium: 3 mmol/L — ABNORMAL LOW (ref 3.5–5.1)
SODIUM: 132 mmol/L — AB (ref 135–145)
Sodium: 130 mmol/L — ABNORMAL LOW (ref 135–145)

## 2016-09-05 LAB — GLUCOSE, CAPILLARY
Glucose-Capillary: 172 mg/dL — ABNORMAL HIGH (ref 65–99)
Glucose-Capillary: 175 mg/dL — ABNORMAL HIGH (ref 65–99)
Glucose-Capillary: 205 mg/dL — ABNORMAL HIGH (ref 65–99)
Glucose-Capillary: 173 mg/dL — ABNORMAL HIGH (ref 65–99)

## 2016-09-05 LAB — T4, FREE: Free T4: 1.16 ng/dL — ABNORMAL HIGH (ref 0.61–1.12)

## 2016-09-05 LAB — MAGNESIUM: MAGNESIUM: 2.4 mg/dL (ref 1.7–2.4)

## 2016-09-05 MED ORDER — POTASSIUM CHLORIDE CRYS ER 20 MEQ PO TBCR
60.0000 meq | EXTENDED_RELEASE_TABLET | Freq: Once | ORAL | Status: AC
Start: 2016-09-05 — End: 2016-09-05
  Administered 2016-09-05: 60 meq via ORAL
  Filled 2016-09-05: qty 3

## 2016-09-05 MED ORDER — POTASSIUM CHLORIDE CRYS ER 20 MEQ PO TBCR
40.0000 meq | EXTENDED_RELEASE_TABLET | Freq: Once | ORAL | Status: AC
  Administered 2016-09-05: 40 meq via ORAL
  Filled 2016-09-05: qty 2

## 2016-09-05 MED ORDER — TORSEMIDE 20 MG PO TABS
40.0000 mg | ORAL_TABLET | Freq: Every day | ORAL | Status: DC
  Administered 2016-09-05 – 2016-09-06 (×2): 40 mg via ORAL
  Filled 2016-09-05 (×2): qty 2

## 2016-09-05 NOTE — Progress Notes (Signed)
Inpatient Diabetes Program Recommendations  AACE/ADA: New Consensus Statement on Inpatient Glycemic Control (2015)  Target Ranges:  Prepandial:   less than 140 mg/dL      Peak postprandial:   less than 180 mg/dL (1-2 hours)      Critically ill patients:  140 - 180 mg/dL   Results for Christine Knight, Christine Knight (MRN [id number]) as of 09/05/2016 10:13  Ref. Range 09/04/2016 06:10 09/04/2016 11:45 09/04/2016 16:47 09/04/2016 21:18 09/05/2016 06:14  Glucose-Capillary Latest Ref Range: 65 - 99 mg/dL 578226 (H)  Novolog 5 units  Lantus 10 units  272 (H)  Novolog 8 units 187 (H)  Novolog 3 units 201 (H)  Novolog 2 units 172 (H)  Novolog 3 units   Review of Glycemic Control  Diabetes history:DM2 Outpatient Diabetes medications: None listed on home medication list Current orders for Inpatient glycemic control: Novolog 0-15 units TID with meals, Novolog 0-5 units QHS, Lantus 10 units daily  Inpatient Diabetes Program Recommendations:  Insulin - Basal: Please consider increasing Lantus to 12 units daily. Insulin-Meal Coverage: If patient is eating at least 50% of meals, please consider ordering Novolog 3 units TID with meals for meal coverage. Outpatient DM medications: No DM medications listed on home medication list. However, in reviewing chart noted patient use to be on oral DM medicaitons. If patient is not taking oral DM medication as an outpatient currently, likely needs to be discharged on DM medications.  Thanks, Orlando PennerMarie Hosam Mcfetridge, RN, MSN, CDE Diabetes Coordinator Inpatient Diabetes Program [phone] (Team Pager from 8am to 5pm)

## 2016-09-05 NOTE — Progress Notes (Signed)
PROGRESS NOTE    Christine Knight  ZOX:[id number] DOB: Jun 09, 1951 DOA: 08/25/2016 PCP: Roxanne Mins, PA-C   Brief Narrative:  65 yo female with ischemic cardiomyopathy, ef 35% and stage 3 ckd, Significant volume retention, patient was on furosemide drip and milrinone drip. Transition to po agents.  Assessment & Plan:   Principal Problem:   Acute on chronic combined systolic and diastolic congestive heart failure (HCC) Active Problems:   Hyperlipidemia LDL goal <70   Type 2 diabetes, uncontrolled, with neuropathy (HCC)   Hypothyroidism   CKD (chronic kidney disease) stage 3, GFR 30-59 ml/min   CHF (congestive heart failure) (HCC)  1. Decompensated systolic heart failure.Will continue diuresis with metolazone, aldactone, demedex. Urine output about 6200 ml. Continue with torsemide.  2. CKD stage 3. Will continue close follow up on renal function and electrolytes, K correction with kcl. Cr at 1.59  3, T2DM. Will continue glucose cover and monitoring. Patient tolerating po well. Serum glucose 172-175-205. Insulin glargine.  4. Hypothyroid. Elevated tsh and free t4, euthyroid sick syndrome. Check TSH once patient more stable.   DVT prophylaxis: lovenox Code Status: full  Family Communication: No family at the bedside Disposition Plan: home   Consultants:   cardiology  Procedures:   Antimicrobials:   Subjective: Improved dyspnea, no chest pain, no nausea or vomiting.  Objective: Vitals:   09/04/16 2011 09/05/16 0403 09/05/16 0818 09/05/16 1224  BP: (!) 104/59 111/64 (!) 95/58 (!) 99/51  Pulse: 69 73 64 79  Resp: 18 18 18 18   Temp: 98.3 F (36.8 C) 98.4 F (36.9 C)  97.5 F (36.4 C)  TempSrc: Oral Oral  Oral  SpO2: 99% 100% 99% 98%  Weight:  102.6 kg (226 lb 3.2 oz)    Height:        Intake/Output Summary (Last 24 hours) at 09/05/16 2016 Last data filed at 09/05/16 1830  Gross per 24 hour  Intake             1003 ml  Output             5500 ml  Net             -4497 ml   Filed Weights   09/03/16 0522 09/04/16 0613 09/05/16 0403  Weight: 107 kg (235 lb 14.4 oz) 105.5 kg (232 lb 8 oz) 102.6 kg (226 lb 3.2 oz)    Examination:  General exam: not in pain or dyspnea.  Respiratory system: Mild rales at bases, no wheezing or rhonchi. Respiratory effort normal. Cardiovascular system: S1 & S2 heard, RRR. No JVD, murmurs, rubs, gallops or clicks. Positive ++ pitting pedal edema. Gastrointestinal system: Abdomen is nondistended, soft and nontender. No organomegaly or masses felt. Normal bowel sounds heard. Central nervous system: Alert and oriented. No focal neurological deficits. Extremities: Symmetric 5 x 5 power. Skin: No rashes, lesions or ulcers     Data Reviewed: I have personally reviewed following labs and imaging studies  CBC:  Recent Labs Lab 09/01/16 0346  WBC 4.9  HGB 12.3  HCT 36.7  MCV 93.4  PLT 218   Basic Metabolic Panel:  Recent Labs Lab 09/02/16 0432 09/03/16 0412 09/04/16 0451 09/05/16 0410 09/05/16 1143  NA 136 131* 133* 132* 130*  K 3.6 3.4* 3.4* 2.7* 3.0*  CL 89* 84* 84* 82* 82*  CO2 35* 35* 34* 36* 35*  GLUCOSE 261* 210* 208* 159* 191*  BUN 34* 33* 41* 44* 45*  CREATININE 1.42* 1.44* 1.60* 1.52* 1.59*  CALCIUM 10.2  9.4 9.8 10.0 9.9  MG 1.8 2.3  --  2.4  --    GFR: Estimated Creatinine Clearance: 42.7 mL/min (by C-G formula based on SCr of 1.59 mg/dL (H)). Liver Function Tests: No results for input(s): AST, ALT, ALKPHOS, BILITOT, PROT, ALBUMIN in the last 168 hours. No results for input(s): LIPASE, AMYLASE in the last 168 hours. No results for input(s): AMMONIA in the last 168 hours. Coagulation Profile: No results for input(s): INR, PROTIME in the last 168 hours. Cardiac Enzymes: No results for input(s): CKTOTAL, CKMB, CKMBINDEX, TROPONINI in the last 168 hours. BNP (last 3 results) No results for input(s): PROBNP in the last 8760 hours. HbA1C:  Recent Labs  09/03/16 1130  HGBA1C 7.0*    CBG:  Recent Labs Lab 09/04/16 1647 09/04/16 2118 09/05/16 0614 09/05/16 1157 09/05/16 1617  GLUCAP 187* 201* 172* 175* 205*   Lipid Profile: No results for input(s): CHOL, HDL, LDLCALC, TRIG, CHOLHDL, LDLDIRECT in the last 72 hours. Thyroid Function Tests:  Recent Labs  09/05/16 0410  TSH 5.542*  FREET4 1.16*   Anemia Panel: No results for input(s): VITAMINB12, FOLATE, FERRITIN, TIBC, IRON, RETICCTPCT in the last 72 hours. Sepsis Labs: No results for input(s): PROCALCITON, LATICACIDVEN in the last 168 hours.  No results found for this or any previous visit (from the past 240 hour(s)).       Radiology Studies: No results found.      Scheduled Meds: . acetaZOLAMIDE  250 mg Oral BID  . aspirin EC  81 mg Oral Daily  . atorvastatin  40 mg Oral q1800  . enoxaparin (LOVENOX) injection  40 mg Subcutaneous Q24H  . gabapentin  300 mg Oral BID  . insulin aspart  0-15 Units Subcutaneous TID WC  . insulin aspart  0-5 Units Subcutaneous QHS  . insulin glargine  10 Units Subcutaneous Daily  . pantoprazole  40 mg Oral Daily  . polyethylene glycol  17 g Oral Daily  . potassium chloride  60 mEq Oral Daily  . sodium chloride flush  3 mL Intravenous Q12H  . spironolactone  25 mg Oral Daily  . torsemide  40 mg Oral Daily   Continuous Infusions:    LOS: 10 days       Mauricio Annett Gulaaniel Arrien, MD [facility] Pager [phone]  If 7PM-7AM, please contact night-coverage [email or web address] Password [email or web address] 09/05/2016, 8:16 PM

## 2016-09-05 NOTE — Progress Notes (Signed)
1050 Came to see pt to walk. Pt stated she is dizzy just walking to bathroom. Does not want me to return later as she feels like she needs the day off. Will respect her decision as she has been receptive to walking. Will follow up tomorrow as time permits.  Luetta [name] Raeshawn Vo RN BSN 09/05/2016 10:50 AM

## 2016-09-05 NOTE — Progress Notes (Signed)
Advanced Heart Failure Rounding Note   Subjective:    Admitted with marked volume overload in setting of having to hold diuretics with marked AKI.   Brisk diuresis with lasix drip, diamox, milrinone, and metolazone. Weight down another 6 pounds.  Complaining of dizzies. Denies SOB .  Creatinine stable  1.4>1.6>1.5   K 2.7    Objective:   Weight Range:  Vital Signs:   Temp:  [97.8 F (36.6 C)-98.4 F (36.9 C)] 98.4 F (36.9 C) (10/26 0403) Pulse Rate:  [69-76] 73 (10/26 0403) Resp:  [18] 18 (10/26 0403) BP: (104-111)/(59-68) 111/64 (10/26 0403) SpO2:  [96 %-100 %] 100 % (10/26 0403) Weight:  [226 lb 3.2 oz (102.6 kg)] 226 lb 3.2 oz (102.6 kg) (10/26 0403) Last BM Date: 09/03/16  Weight change: Filed Weights   09/03/16 0522 09/04/16 0613 09/05/16 0403  Weight: 235 lb 14.4 oz (107 kg) 232 lb 8 oz (105.5 kg) 226 lb 3.2 oz (102.6 kg)    Intake/Output:   Intake/Output Summary (Last 24 hours) at 09/05/16 0809 Last data filed at 09/05/16 0615  Gross per 24 hour  Intake           1261.2 ml  Output             6200 ml  Net          -4938.8 ml     Physical Exam: General:  Seated in chair. NAD. HEENT: normal. On O2 via [place].  Neck: supple. JVP ~6-7. Carotids 2+ bilat; no bruits. No thyromegaly or lymphadenopathy. Cor: PMI nondisplaced. RRR. No M/G/R Lungs: Diminished throughout with mild crackles.  Abdomen: Morbidly obese, soft, NT, ND, no HSM. No bruits or masses. +BS  Extremities: no cyanosis, clubbing, rash.  Rand LLE UNNA boots on   Neuro: alert & orientedx3, cranial nerves grossly intact. moves all 4 extremities w/o difficulty. Affect pleasant  Telemetry: Reviewed, NSR  Labs: Basic Metabolic Panel:  Recent Labs Lab 09/01/16 0346 09/02/16 0432 09/03/16 0412 09/04/16 0451 09/05/16 0410  NA 136 136 131* 133* 132*  K 3.5 3.6 3.4* 3.4* 2.7*  CL 90* 89* 84* 84* 82*  CO2 35* 35* 35* 34* 36*  GLUCOSE 249* 261* 210* 208* 159*  BUN 34* 34* 33* 41* 44*    CREATININE 1.33* 1.42* 1.44* 1.60* 1.52*  CALCIUM 9.9 10.2 9.4 9.8 10.0  MG  --  1.8 2.3  --  2.4    Liver Function Tests: No results for input(s): AST, ALT, ALKPHOS, BILITOT, PROT, ALBUMIN in the last 168 hours. No results for input(s): LIPASE, AMYLASE in the last 168 hours. No results for input(s): AMMONIA in the last 168 hours.  CBC:  Recent Labs Lab 09/01/16 0346  WBC 4.9  HGB 12.3  HCT 36.7  MCV 93.4  PLT 218    Cardiac Enzymes: No results for input(s): CKTOTAL, CKMB, CKMBINDEX, TROPONINI in the last 168 hours.  BNP: BNP (last 3 results)  Recent Labs  04/11/16 1929 06/05/16 1230 08/25/16 2119  BNP 1,176.5* 906.9* 1,359.9*    ProBNP (last 3 results) No results for input(s): PROBNP in the last 8760 hours.    Other results:  Imaging: No results found.   Medications:     Scheduled Medications: . acetaZOLAMIDE  250 mg Oral BID  . aspirin EC  81 mg Oral Daily  . atorvastatin  40 mg Oral q1800  . enoxaparin (LOVENOX) injection  40 mg Subcutaneous Q24H  . gabapentin  300 mg Oral BID  .  insulin aspart  0-15 Units Subcutaneous TID WC  . insulin aspart  0-5 Units Subcutaneous QHS  . insulin glargine  10 Units Subcutaneous Daily  . metolazone  5 mg Oral BID  . pantoprazole  40 mg Oral Daily  . polyethylene glycol  17 g Oral Daily  . potassium chloride  60 mEq Oral Daily  . sodium chloride flush  3 mL Intravenous Q12H  . spironolactone  25 mg Oral Daily    Infusions: . furosemide (LASIX) infusion 20 mg/hr (09/04/16 1816)  . milrinone 0.125 mcg/kg/min (09/04/16 1709)    PRN Medications: acetaminophen, bisacodyl, magnesium hydroxide, ondansetron (ZOFRAN) IV, sodium chloride, traMADol   Assessment/Plan/Discussion:   1. Acute/Chronic systolic HF due to iCM EF 15-20% Echo 04/14/2016.Recent admit for low out put HF and diuresed over 50 pounds.Hospital course complicated by cardiorenal failure. ECHO 9/26 at the [facility].  EF 30-35% RV  normal.  NYHA IIIb. - Brisk diuresis noted. Weight trending down.  Stop IV lasix, metolazone, milrinone. Continue diamox. Consider starting torsemide 40 mg daily tomorrow.  Remove unna boots and place ted hose.   Previous d/c weight was 222 pounds (she is 226 today - down from 268)  - No BB with hx of bradycardia. - Has not tolerated Entresto with elevated creatinine.  - Continue spiro to 25 mg daily.  Watch renal function carefully. Repeat BMET 1300 Creatinine trending up 1.4>1.6 >1.52 - Intolerant bidil but difficulty with dizziness and hypotension.  - UNNA boots on 2. CAD s/p NSTEMI and CABG 10/12  - Severe native CAD with patent grafts on [facility] 12/13/15. Chest tightness on admit. Now resolved. Troponin 0.04 - Continue ASA 81 mg . Continue atorvastatin 40 mg daily.  3. Morbid obesity 4. Pulmonary HTN - mild to mod RHC 12/13/15. PVR 3.2.  5. AKI on CKD stage III- Creatinine on 9/25 3.1.   - Continue to follow closely with diuresis. Stable thus far.  6. Hypokalemia - Todays K  2.7. Supplement potassium.  Already received 40 meq this am and will receive another 60 meq at 1000. Repeat BMET at 1300.    Length of Stay: 10  [name] NP-C  09/05/2016, 8:09 AM Advanced Heart Failure Team Pager [phone] (M-F; 7a - 4p)  Please contact [facility] for night-coverage after hours (4p -7a ) and weekends on [email or web address]   Patient seen and examined with Tonye Becket, NP. We discussed all aspects of the encounter. I agree with the assessment and plan as stated above.   Much improved. Nearing baseline weight. Can stop IV lasix and milrinone. Resume torsemide today. Continue diamox. Possibly home tomorrow. Supp K+.   Bensimhon, Daniel,MD 1:41 PM

## 2016-09-06 ENCOUNTER — Encounter (HOSPITAL_COMMUNITY): Admission: RE | Admit: 2016-09-06 | Payer: Medicare Other | Source: Ambulatory Visit

## 2016-09-06 DIAGNOSIS — E785 Hyperlipidemia, unspecified: Secondary | ICD-10-CM

## 2016-09-06 LAB — BASIC METABOLIC PANEL
Anion gap: 13 (ref 5–15)
BUN: 48 mg/dL — AB (ref 6–20)
CALCIUM: 10 mg/dL (ref 8.9–10.3)
CO2: 34 mmol/L — AB (ref 22–32)
CREATININE: 1.57 mg/dL — AB (ref 0.44–1.00)
Chloride: 86 mmol/L — ABNORMAL LOW (ref 101–111)
GFR calc non Af Amer: 34 mL/min — ABNORMAL LOW (ref >60)
GFR, EST AFRICAN AMERICAN: 39 mL/min — AB (ref >60)
Glucose, Bld: 172 mg/dL — ABNORMAL HIGH (ref 65–99)
Potassium: 3.1 mmol/L — ABNORMAL LOW (ref 3.5–5.1)
SODIUM: 133 mmol/L — AB (ref 135–145)

## 2016-09-06 LAB — GLUCOSE, CAPILLARY
GLUCOSE-CAPILLARY: 160 mg/dL — AB (ref 65–99)
GLUCOSE-CAPILLARY: 187 mg/dL — AB (ref 65–99)
GLUCOSE-CAPILLARY: 235 mg/dL — AB (ref 65–99)
GLUCOSE-CAPILLARY: 245 mg/dL — AB (ref 65–99)

## 2016-09-06 MED ORDER — TORSEMIDE 20 MG PO TABS: 40.0000 mg | ORAL_TABLET | ORAL | Status: DC

## 2016-09-06 MED ORDER — POTASSIUM CHLORIDE CRYS ER 20 MEQ PO TBCR: 60.0000 meq | EXTENDED_RELEASE_TABLET | Freq: Once | ORAL | Status: DC

## 2016-09-06 MED ORDER — POTASSIUM CHLORIDE CRYS ER 20 MEQ PO TBCR
60.0000 meq | EXTENDED_RELEASE_TABLET | Freq: Once | ORAL | Status: AC
  Administered 2016-09-06: 60 meq via ORAL
  Filled 2016-09-06: qty 3

## 2016-09-06 MED ORDER — SODIUM CHLORIDE 0.9 % IV BOLUS (SEPSIS)
500.0000 mL | Freq: Once | INTRAVENOUS | Status: AC
  Administered 2016-09-06: 500 mL via INTRAVENOUS

## 2016-09-06 NOTE — Progress Notes (Signed)
PROGRESS NOTE    Christine BabeBarbara Knight  XBM:[id number] DOB: 1950-12-21 DOA: 08/25/2016 PCP: Roxanne Minsuran, [name], PA-C    Brief Narrative:  65 yo female with ischemic cardiomyopathy, ef 35% and stage 3 ckd, Significant volume retention, patient was on furosemide drip and milrinone drip. Transition to po agents.  Assessment & Plan:   Principal Problem:   Acute on chronic combined systolic and diastolic congestive heart failure (HCC) Active Problems:   Hyperlipidemia LDL goal <70   Type 2 diabetes, uncontrolled, with neuropathy (HCC)   Hypothyroidism   CKD (chronic kidney disease) stage 3, GFR 30-59 ml/min   CHF (congestive heart failure) (HCC)  1. Decompensated systolic heart failure. Continue diuresis with metolazone 3 times per week and daily aldactone.  Clinically hypovolemic, negative orthostatics. Will continue to hold on ace inh and b blockade to prevent hemodynamic decompensation. Blood pressure systolic 103 to 115.  2. CKD stage 3. Renal function with stable cr at 1.5, with K at 3.1, will continue correction of electrolytes with kcl (60 kcl po daily), follow renal panel in am. Urine output about 4800 cc for last 24 hours.   3, T2DM. Will continue glucose cover and monitoring. Patient tolerating po well. capillary glucose 173-160-187 Insulin glargine 10 units.  4. Hypothyroid. Elevated tsh and free t4, euthyroid sick syndrome. Check TSH once patient more stable. Clinically euthyroid.   5. Dizziness. Suspected to be related to over diuresis, will continue metolazone q 48 hours, follow blood pressure. Negative orthostatics. Has saline 500 cc per cardiology. Physical therapy evaluation, out of bed as tolerated.   DVT prophylaxis: lovenox Code Status: full  Family Communication: No family at the bedside Disposition Plan: home    Consultants:   Cardiology  Procedures:   Antimicrobials:   Subjective: Patient with improved dyspnea and lower extremity edema, positive dizziness  on ambulation, no chest pain, no nausea or vomiting.   Objective: Vitals:   09/05/16 1224 09/05/16 2052 09/06/16 0652 09/06/16 1159  BP: (!) 99/51 (!) 103/58 115/68   Pulse: 79 (!) 57 70 64  Resp: 18 20    Temp: 97.5 F (36.4 C) 98.2 F (36.8 C) 97.9 F (36.6 C) 97.6 F (36.4 C)  TempSrc: Oral Oral Oral Oral  SpO2: 98% 98% 99%   Weight:   100.6 kg (221 lb 11.2 oz)   Height:        Intake/Output Summary (Last 24 hours) at 09/06/16 1256 Last data filed at 09/06/16 1023  Gross per 24 hour  Intake              923 ml  Output             2875 ml  Net            -1952 ml   Filed Weights   09/04/16 0613 09/05/16 0403 09/06/16 0652  Weight: 105.5 kg (232 lb 8 oz) 102.6 kg (226 lb 3.2 oz) 100.6 kg (221 lb 11.2 oz)    Examination:  General exam: Patient deconditioned, not in pain or dyspnea Respiratory system: Decreased breath sounds at bases, no wheezing, rales or rhonchi. Respiratory effort normal. Cardiovascular system: S1 & S2 heard, RRR. No JVD, murmurs, rubs, gallops or clicks. Pitting edema ++ bilateral. Gastrointestinal system: Abdomen is nondistended, soft and nontender. No organomegaly or masses felt. Normal bowel sounds heard. Central nervous system: Alert and oriented. No focal neurological deficits. Extremities: Symmetric 5 x 5 power. Skin: No rashes, lesions or ulcers      Data Reviewed: I  have personally reviewed following labs and imaging studies  CBC:  Recent Labs Lab 09/01/16 0346  WBC 4.9  HGB 12.3  HCT 36.7  MCV 93.4  PLT 218   Basic Metabolic Panel:  Recent Labs Lab 09/02/16 0432 09/03/16 0412 09/04/16 0451 09/05/16 0410 09/05/16 1143 09/06/16 0348  NA 136 131* 133* 132* 130* 133*  K 3.6 3.4* 3.4* 2.7* 3.0* 3.1*  CL 89* 84* 84* 82* 82* 86*  CO2 35* 35* 34* 36* 35* 34*  GLUCOSE 261* 210* 208* 159* 191* 172*  BUN 34* 33* 41* 44* 45* 48*  CREATININE 1.42* 1.44* 1.60* 1.52* 1.59* 1.57*  CALCIUM 10.2 9.4 9.8 10.0 9.9 10.0  MG 1.8 2.3   --  2.4  --   --    GFR: Estimated Creatinine Clearance: 42.7 mL/min (by C-G formula based on SCr of 1.57 mg/dL (H)). Liver Function Tests: No results for input(s): AST, ALT, ALKPHOS, BILITOT, PROT, ALBUMIN in the last 168 hours. No results for input(s): LIPASE, AMYLASE in the last 168 hours. No results for input(s): AMMONIA in the last 168 hours. Coagulation Profile: No results for input(s): INR, PROTIME in the last 168 hours. Cardiac Enzymes: No results for input(s): CKTOTAL, CKMB, CKMBINDEX, TROPONINI in the last 168 hours. BNP (last 3 results) No results for input(s): PROBNP in the last 8760 hours. HbA1C: No results for input(s): HGBA1C in the last 72 hours. CBG:  Recent Labs Lab 09/05/16 1157 09/05/16 1617 09/05/16 2043 09/06/16 0650 09/06/16 1201  GLUCAP 175* 205* 173* 160* 187*   Lipid Profile: No results for input(s): CHOL, HDL, LDLCALC, TRIG, CHOLHDL, LDLDIRECT in the last 72 hours. Thyroid Function Tests:  Recent Labs  09/05/16 0410  TSH 5.542*  FREET4 1.16*   Anemia Panel: No results for input(s): VITAMINB12, FOLATE, FERRITIN, TIBC, IRON, RETICCTPCT in the last 72 hours. Sepsis Labs: No results for input(s): PROCALCITON, LATICACIDVEN in the last 168 hours.  No results found for this or any previous visit (from the past 240 hour(s)).       Radiology Studies: No results found.      Scheduled Meds: . aspirin EC  81 mg Oral Daily  . atorvastatin  40 mg Oral q1800  . enoxaparin (LOVENOX) injection  40 mg Subcutaneous Q24H  . gabapentin  300 mg Oral BID  . insulin aspart  0-15 Units Subcutaneous TID WC  . insulin aspart  0-5 Units Subcutaneous QHS  . insulin glargine  10 Units Subcutaneous Daily  . pantoprazole  40 mg Oral Daily  . polyethylene glycol  17 g Oral Daily  . potassium chloride  60 mEq Oral Daily  . sodium chloride  500 mL Intravenous Once  . sodium chloride flush  3 mL Intravenous Q12H  . spironolactone  25 mg Oral Daily  . [START  ON 09/09/2016] torsemide  40 mg Oral Once per day on Mon Wed Fri   Continuous Infusions:    LOS: 11 days       Abad Manard Annett Gula, MD [facility] Pager [phone]  If 7PM-7AM, please contact night-coverage [email or web address] Password [email or web address] 09/06/2016, 12:56 PM

## 2016-09-06 NOTE — Care Management Important Message (Signed)
Important Message  Patient Details  Name: Christine Knight MRN: [id number] Date of Birth: Oct 15, 1951   Medicare Important Message Given:  Yes    [name] 09/06/2016, 2:45 PM

## 2016-09-06 NOTE — Progress Notes (Signed)
1404 Came to see pt to walk. Still dizzy and fluid bolus infusing. Stated she will try to walk with us tomorrow. Will follow up tomorrow as time permits. Luetta [name] Coda Mathey RN BSN 09/06/2016 2:30 PM

## 2016-09-06 NOTE — Progress Notes (Signed)
Advanced Heart Failure Rounding Note   Subjective:    Admitted with marked volume overload in setting of having to hold diuretics with marked AKI.   Brisk diuresis with lasix drip, diamox, milrinone, and metolazone. Weight down another 6 pounds.  Feeling much better from fluid and breathing stand point, but still feeling lightheadedness with standing up. Wants to restart cardiac rehab as soon as she can.   Creatinine stable  1.4>1.6>1.5>1.57.   K 3.1   Objective:   Weight Range:  Vital Signs:   Temp:  [97.5 F (36.4 C)-98.2 F (36.8 C)] 97.9 F (36.6 C) (10/27 1610) Pulse Rate:  [57-79] 70 (10/27 0652) Resp:  [18-20] 20 (10/26 2052) BP: (99-115)/(51-68) 115/68 (10/27 0652) SpO2:  [98 %-99 %] 99 % (10/27 0652) Weight:  [221 lb 11.2 oz (100.6 kg)] 221 lb 11.2 oz (100.6 kg) (10/27 0652) Last BM Date: 09/05/16  Weight change: Filed Weights   09/04/16 0613 09/05/16 0403 09/06/16 0652  Weight: 232 lb 8 oz (105.5 kg) 226 lb 3.2 oz (102.6 kg) 221 lb 11.2 oz (100.6 kg)    Intake/Output:   Intake/Output Summary (Last 24 hours) at 09/06/16 1050 Last data filed at 09/06/16 1023  Gross per 24 hour  Intake              923 ml  Output             3075 ml  Net            -2152 ml     Physical Exam: General:  Seated in chair. Fatigued appearing HEENT: normal. On O2 via [place].  Neck: supple. JVP flat  Carotids 2+ bilat; no bruits. No thyromegaly or nodule noted. Cor: PMI nondisplaced. RRR. No M/G/R Lungs: Slightly diminished basilar sounds.  Abdomen: Morbidly obese, soft, NT, ND, no HSM. No bruits or masses. +BS  Extremities: no cyanosis, clubbing, rash.  Ted hose in place Neuro: alert & orientedx3, cranial nerves grossly intact. moves all 4 extremities w/o difficulty. Affect pleasant  Telemetry: Reviewed, NSR   Labs: Basic Metabolic Panel:  Recent Labs Lab 09/02/16 0432 09/03/16 0412 09/04/16 0451 09/05/16 0410 09/05/16 1143 09/06/16 0348  NA 136 131* 133* 132*  130* 133*  K 3.6 3.4* 3.4* 2.7* 3.0* 3.1*  CL 89* 84* 84* 82* 82* 86*  CO2 35* 35* 34* 36* 35* 34*  GLUCOSE 261* 210* 208* 159* 191* 172*  BUN 34* 33* 41* 44* 45* 48*  CREATININE 1.42* 1.44* 1.60* 1.52* 1.59* 1.57*  CALCIUM 10.2 9.4 9.8 10.0 9.9 10.0  MG 1.8 2.3  --  2.4  --   --     Liver Function Tests: No results for input(s): AST, ALT, ALKPHOS, BILITOT, PROT, ALBUMIN in the last 168 hours. No results for input(s): LIPASE, AMYLASE in the last 168 hours. No results for input(s): AMMONIA in the last 168 hours.  CBC:  Recent Labs Lab 09/01/16 0346  WBC 4.9  HGB 12.3  HCT 36.7  MCV 93.4  PLT 218    Cardiac Enzymes: No results for input(s): CKTOTAL, CKMB, CKMBINDEX, TROPONINI in the last 168 hours.  BNP: BNP (last 3 results)  Recent Labs  04/11/16 1929 06/05/16 1230 08/25/16 2119  BNP 1,176.5* 906.9* 1,359.9*    ProBNP (last 3 results) No results for input(s): PROBNP in the last 8760 hours.    Other results:  Imaging: No results found.   Medications:     Scheduled Medications: . acetaZOLAMIDE  250 mg Oral BID  .  aspirin EC  81 mg Oral Daily  . atorvastatin  40 mg Oral q1800  . enoxaparin (LOVENOX) injection  40 mg Subcutaneous Q24H  . gabapentin  300 mg Oral BID  . insulin aspart  0-15 Units Subcutaneous TID WC  . insulin aspart  0-5 Units Subcutaneous QHS  . insulin glargine  10 Units Subcutaneous Daily  . pantoprazole  40 mg Oral Daily  . polyethylene glycol  17 g Oral Daily  . potassium chloride  60 mEq Oral Daily  . sodium chloride flush  3 mL Intravenous Q12H  . spironolactone  25 mg Oral Daily  . torsemide  40 mg Oral Daily    Infusions:    PRN Medications: acetaminophen, bisacodyl, magnesium hydroxide, ondansetron (ZOFRAN) IV, sodium chloride, traMADol   Assessment/Plan/Discussion:   1. Acute/Chronic systolic HF due to iCM EF 15-20% Echo 04/14/2016.Recent admit for low out put HF and diuresed over 50 pounds.Hospital course  complicated by cardiorenal failure. [facility]  EF 30-35% RV normal.  NYHA IIIb. - Brisk diuresis again noted with torsemide and diamox. Down 5 more lbs.  - Stop diamox. Will send home on torsemide 40 mg daily with close follow up.   - Continue place ted hose.  - Previous d/c weight was 222 pounds (she is 221 today - down from 268) - No BB with hx of bradycardia. - Has not tolerated Entresto with elevated creatinine.  - Continue spiro to 25 mg daily.  Watch renal function carefully. Repeat BMET 1300 Creatinine relatively stable. 1.4>1.6 >1.52 > 1.57 - Intolerant bidil but difficulty with dizziness and hypotension.  - UNNA boots on 2. CAD s/p NSTEMI and CABG 10/12  - Severe native [facility]. Chest tightness on admit. No further. Troponin 0.04 - Continue ASA 81 mg . Continue atorvastatin 40 mg daily.  3. Morbid obesity 4. Pulmonary HTN - mild to mod RHC 12/13/15. PVR 3.2.  5. AKI on CKD stage III- Creatinine on 9/25 3.1.   - Continue to follow closely with diuresis. Stable thus far.  6. Hypokalemia - Todays K  3.1. Will give additional supp again today to top off for home.  7. Dizziness - Brisk diuresis again noted.  - Will give back 500 cc empirically.  Will place on torsemide 40 mg three times weekly  Likely home today but will see how she feels with a little bit of fluid back.  Check orthostatics. She is off all antihypertensives.  Stopping diamox.  Supp K  Length of Stay: [address]11  Graciella FreerMichael Andrew Tillery PA-C  09/06/2016, 10:50 AM Advanced Heart Failure Team Pager [phone] (M-F; 7a - 4p)  Please contact [facility] for night-coverage after hours (4p -7a ) and weekends on [email or web address]  Patient seen and examined with Otilio SaberAndy Tillery, PA-C. We discussed all aspects of the encounter. I agree with the assessment and plan as stated above.   Volumes status no below baseline and she is dizzy. Will give 500cc back. Renal function stable.   If feeling better can go home alter today. On torsemide 40 M/W/F, Will see back in HF Clinic next week. Supp K+.   Fayelynn Distel,MD 12:32 PM

## 2016-09-07 LAB — BASIC METABOLIC PANEL
ANION GAP: 12 (ref 5–15)
BUN: 52 mg/dL — AB (ref 6–20)
CO2: 33 mmol/L — ABNORMAL HIGH (ref 22–32)
Calcium: 9.3 mg/dL (ref 8.9–10.3)
Chloride: 89 mmol/L — ABNORMAL LOW (ref 101–111)
Creatinine, Ser: 1.71 mg/dL — ABNORMAL HIGH (ref 0.44–1.00)
GFR, EST AFRICAN AMERICAN: 35 mL/min — AB (ref >60)
GFR, EST NON AFRICAN AMERICAN: 30 mL/min — AB (ref >60)
Glucose, Bld: 164 mg/dL — ABNORMAL HIGH (ref 65–99)
POTASSIUM: 3.4 mmol/L — AB (ref 3.5–5.1)
SODIUM: 134 mmol/L — AB (ref 135–145)

## 2016-09-07 LAB — GLUCOSE, CAPILLARY
GLUCOSE-CAPILLARY: 139 mg/dL — AB (ref 65–99)
GLUCOSE-CAPILLARY: 211 mg/dL — AB (ref 65–99)
GLUCOSE-CAPILLARY: 159 mg/dL — AB (ref 65–99)
GLUCOSE-CAPILLARY: 184 mg/dL — AB (ref 65–99)
Glucose-Capillary: 186 mg/dL — ABNORMAL HIGH (ref 65–99)

## 2016-09-07 NOTE — Evaluation (Signed)
Physical Therapy Evaluation Patient Details Name: Christine Knight MRN: [id number] DOB: 1951-01-04 Today's Date: 09/07/2016   History of Present Illness  Pt adm with acute on chronic combined heart failure. Pt also now with worsening renal function. PMH - chf, cabg, obesity, dm, ckd  Clinical Impression  Pt doing well with mobility and no further PT needed.       Follow Up Recommendations No PT follow up    Equipment Recommendations  None recommended by PT    Recommendations for Other Services       Precautions / Restrictions Precautions Precautions: None      Mobility  Bed Mobility Overal bed mobility: Independent                Transfers Overall transfer level: Independent                  Ambulation/Gait Ambulation/Gait assistance: Modified independent (Device/Increase time) Ambulation Distance (Feet): 300 Feet Assistive device: None Gait Pattern/deviations: WFL(Within Functional Limits) Gait velocity: decr Gait velocity interpretation: Below normal speed for age/gender General Gait Details: steady gait with no c/o's of dizziness  Stairs            Wheelchair Mobility    Modified Rankin (Stroke Patients Only)       Balance Overall balance assessment: Modified Independent                                           Pertinent Vitals/Pain Pain Assessment: No/denies pain    Home Living Family/patient expects to be discharged to:: Private residence Living Arrangements: Other relatives   Type of Home: Apartment Home Access: Stairs to enter Entrance Stairs-Rails: Right Entrance Stairs-Number of Steps: 2 Home Layout: One level Home Equipment: Cane - single point      Prior Function Level of Independence: Independent               Hand Dominance   Dominant Hand: Right    Extremity/Trunk Assessment   Upper Extremity Assessment: Overall WFL for tasks assessed           Lower Extremity Assessment:  Overall WFL for tasks assessed         Communication   Communication: No difficulties  Cognition Arousal/Alertness: Awake/alert Behavior During Therapy: WFL for tasks assessed/performed Overall Cognitive Status: Within Functional Limits for tasks assessed                      General Comments      Exercises     Assessment/Plan    PT Assessment Patent does not need any further PT services  PT Problem List            PT Treatment Interventions      PT Goals (Current goals can be found in the Care Plan section)  Acute Rehab PT Goals PT Goal Formulation: All assessment and education complete, DC therapy    Frequency     Barriers to discharge        Co-evaluation               End of Session   Activity Tolerance: Patient tolerated treatment well Patient left: in bed;with call bell/phone within reach           Time: [id number] PT Time Calculation (min) (ACUTE ONLY): 9 min   Charges:   PT Evaluation $PT Eval Low  Complexity: 1 Procedure     PT G Codes:        Jameson Morrow 09/07/2016, 4:26 PM [facility] [phone]

## 2016-09-07 NOTE — Progress Notes (Signed)
PROGRESS NOTE    [name]  ZOX:[id number] DOB: 03/24/1951 DOA: 08/25/2016 PCP: Roxanne Minsuran, [name], PA-C   Brief Narrative:65 yo female with ischemic cardiomyopathy, ef 35% and stage 3 ckd, Significant volume retention, patient was on furosemide drip and milrinone drip. Transition to po agents. Holding discharge due to worsening renal function.    Assessment & Plan:   Principal Problem:   Acute on chronic combined systolic and diastolic congestive heart failure (HCC) Active Problems:   Hyperlipidemia LDL goal <70   Type 2 diabetes, uncontrolled, with neuropathy (HCC)   Hypothyroidism   CKD (chronic kidney disease) stage 3, GFR 30-59 ml/min   CHF (congestive heart failure) (HCC)   1. Decompensated systolic heart failure.  Scheduled next dose to torsemide for 10/30, will hold aldactone for now, holding b blockade and ace inh due to risk of hemodynamic decompensation. Systolic blood pressure 90 to 811100, and heart rate 47 to 52.    2. CKD stage 3. worsening renal function, now cr up to 1,71 from 1.57, K at 3,4 and serum bicarb at 33. Will continue to hold on diuresis. Avoid hypotension or nephrotoxic medications, follow renal panel in am. Suspected over-diuresis. Urine output about 3000 ml over last 24 hours.    3, T2DM.Will continue glucose cover and monitoring. Patient tolerating po well. capillary glucose 139 to 245, continue with basal Insulin glargine 10 units.  4. Hypothyroid.Elevated tsh and free t4, euthyroid sick syndrome. Clinically euthyroid.   5. Dizziness. Continue to hold on diuretics, out of bed as tolerated.   DVT prophylaxis:lovenox Code Status: full Family Communication:No family at the bedside Disposition Plan: home   Consultants:   Cardiology  Procedures:   Antimicrobials   Subjective: Patient anxious to go home, persistent dizziness. No chest pain, no nausea or vomiting.   Objective: Vitals:   09/06/16 1159 09/06/16 2223 09/07/16  0631 09/07/16 0633  BP:  (!) 102/56  111/82  Pulse: 64 (!) 57  (!) 47  Resp:  18  18  Temp: 97.6 F (36.4 C) 97.3 F (36.3 C)  98.4 F (36.9 C)  TempSrc: Oral Oral  Oral  SpO2:  100%  100%  Weight:   102.1 kg (225 lb)   Height:        Intake/Output Summary (Last 24 hours) at 09/07/16 1059 Last data filed at 09/07/16 0904  Gross per 24 hour  Intake             1080 ml  Output             2600 ml  Net            -1520 ml   Filed Weights   09/05/16 0403 09/06/16 0652 09/07/16 0631  Weight: 102.6 kg (226 lb 3.2 oz) 100.6 kg (221 lb 11.2 oz) 102.1 kg (225 lb)    Examination:  General exam: Patient deconditioned and ill looking appearing E ENT. No pallor or icterus, oral mucosa dry.  Respiratory system: Mild rales at bases, no wheezing or rhonchi.  Respiratory effort normal. Cardiovascular system: S1 & S2 heard, RRR. No JVD, murmurs, rubs, gallops or clicks. ++ pitting edema. Gastrointestinal system: Abdomen is nondistended, soft and nontender. No organomegaly or masses felt. Normal bowel sounds heard. Central nervous system: Alert and oriented. No focal neurological deficits. Extremities: Symmetric 5 x 5 power. Skin: No rashes, lesions or ulcers   Data Reviewed: I have personally reviewed following labs and imaging studies  CBC:  Recent Labs Lab 09/01/16 0346  WBC  4.9  HGB 12.3  HCT 36.7  MCV 93.4  PLT 218   Basic Metabolic Panel:  Recent Labs Lab 09/02/16 0432 09/03/16 0412 09/04/16 0451 09/05/16 0410 09/05/16 1143 09/06/16 0348 09/07/16 0318  NA 136 131* 133* 132* 130* 133* 134*  K 3.6 3.4* 3.4* 2.7* 3.0* 3.1* 3.4*  CL 89* 84* 84* 82* 82* 86* 89*  CO2 35* 35* 34* 36* 35* 34* 33*  GLUCOSE 261* 210* 208* 159* 191* 172* 164*  BUN 34* 33* 41* 44* 45* 48* 52*  CREATININE 1.42* 1.44* 1.60* 1.52* 1.59* 1.57* 1.71*  CALCIUM 10.2 9.4 9.8 10.0 9.9 10.0 9.3  MG 1.8 2.3  --  2.4  --   --   --    GFR: Estimated Creatinine Clearance: 39.6 mL/min (by C-G formula  based on SCr of 1.71 mg/dL (H)). Liver Function Tests: No results for input(s): AST, ALT, ALKPHOS, BILITOT, PROT, ALBUMIN in the last 168 hours. No results for input(s): LIPASE, AMYLASE in the last 168 hours. No results for input(s): AMMONIA in the last 168 hours. Coagulation Profile: No results for input(s): INR, PROTIME in the last 168 hours. Cardiac Enzymes: No results for input(s): CKTOTAL, CKMB, CKMBINDEX, TROPONINI in the last 168 hours. BNP (last 3 results) No results for input(s): PROBNP in the last 8760 hours. HbA1C: No results for input(s): HGBA1C in the last 72 hours. CBG:  Recent Labs Lab 09/05/16 2043 09/06/16 0650 09/06/16 1201 09/06/16 1643 09/06/16 2208  GLUCAP 173* 160* 187* 235* 245*   Lipid Profile: No results for input(s): CHOL, HDL, LDLCALC, TRIG, CHOLHDL, LDLDIRECT in the last 72 hours. Thyroid Function Tests:  Recent Labs  09/05/16 0410  TSH 5.542*  FREET4 1.16*   Anemia Panel: No results for input(s): VITAMINB12, FOLATE, FERRITIN, TIBC, IRON, RETICCTPCT in the last 72 hours. Sepsis Labs: No results for input(s): PROCALCITON, LATICACIDVEN in the last 168 hours.  No results found for this or any previous visit (from the past 240 hour(s)).       Radiology Studies: No results found.      Scheduled Meds: . aspirin EC  81 mg Oral Daily  . atorvastatin  40 mg Oral q1800  . enoxaparin (LOVENOX) injection  40 mg Subcutaneous Q24H  . gabapentin  300 mg Oral BID  . insulin aspart  0-15 Units Subcutaneous TID WC  . insulin aspart  0-5 Units Subcutaneous QHS  . insulin glargine  10 Units Subcutaneous Daily  . pantoprazole  40 mg Oral Daily  . polyethylene glycol  17 g Oral Daily  . sodium chloride flush  3 mL Intravenous Q12H  . spironolactone  25 mg Oral Daily  . [START ON 09/09/2016] torsemide  40 mg Oral Once per day on Mon Wed Fri   Continuous Infusions:    LOS: 12 days       Christine Knight Christine Knight Christine Mancini, MD [facility] Pager  [phone]  If 7PM-7AM, please contact night-coverage [email or web address] Password [email or web address] 09/07/2016, 10:59 AM

## 2016-09-08 DIAGNOSIS — E038 Other specified hypothyroidism: Secondary | ICD-10-CM

## 2016-09-08 DIAGNOSIS — N179 Acute kidney failure, unspecified: Secondary | ICD-10-CM

## 2016-09-08 LAB — BASIC METABOLIC PANEL
Anion gap: 13 (ref 5–15)
BUN: 56 mg/dL — AB (ref 6–20)
CHLORIDE: 92 mmol/L — AB (ref 101–111)
CO2: 27 mmol/L (ref 22–32)
Calcium: 9.1 mg/dL (ref 8.9–10.3)
Creatinine, Ser: 1.62 mg/dL — ABNORMAL HIGH (ref 0.44–1.00)
GFR calc Af Amer: 37 mL/min — ABNORMAL LOW (ref >60)
GFR calc non Af Amer: 32 mL/min — ABNORMAL LOW (ref >60)
GLUCOSE: 139 mg/dL — AB (ref 65–99)
POTASSIUM: 3.1 mmol/L — AB (ref 3.5–5.1)
Sodium: 132 mmol/L — ABNORMAL LOW (ref 135–145)

## 2016-09-08 LAB — GLUCOSE, CAPILLARY
Glucose-Capillary: 145 mg/dL — ABNORMAL HIGH (ref 65–99)
Glucose-Capillary: 204 mg/dL — ABNORMAL HIGH (ref 65–99)

## 2016-09-08 MED ORDER — TORSEMIDE 20 MG PO TABS: 40.0000 mg | ORAL_TABLET | ORAL | 0 refills | Status: DC

## 2016-09-08 MED ORDER — SPIRONOLACTONE 25 MG PO TABS: 25.0000 mg | ORAL_TABLET | Freq: Every day | ORAL | 0 refills | Status: DC

## 2016-09-08 MED ORDER — ATORVASTATIN CALCIUM 40 MG PO TABS: 40.0000 mg | ORAL_TABLET | Freq: Every day | ORAL | 0 refills | Status: DC

## 2016-09-08 NOTE — Progress Notes (Signed)
Patient is discharge to home accompanied by patient's daughter and NT via wheelchair. Discharge instructions given . Patient verbalizes understanding. All personal belongings given. Telemetry box and IV removed prior to discharge and site in good condition.  

## 2016-09-08 NOTE — Discharge Summary (Signed)
Physician Discharge Summary  Christine Knight KGY:[id number] DOB: 15-Aug-1951 DOA: 08/25/2016  PCP: Isaias Cowman, PA-C  Admit date: 08/25/2016 Discharge date: 09/08/2016  Admitted From: Home Disposition:  Home   Recommendations for Outpatient Follow-up:  1. Follow up with PCP in 1-2 weeks 2. Please obtain BMP in one week 3. Follow thyroid function test in one week   Home Health: No  Equipment/Devices: No    Discharge Condition: Stable   CODE STATUS: Full  Diet recommendation: Heart Healthy / Carb Modified   Brief/Interim Summary: This is a 65 year old female who presented to hospital with a chief complaint of chest pain. Her pain was on the left side of her chest, nonradiating, pressure-like in nature, no associated dizziness, diaphoresis or palpitations. Positive for orthopnea and lower extremity edema. 30 pound weight gain over last few weeks prior to admission, apparently she was taken off her diuretics, as an outpatient. On her initial physical examination her blood pressure was 126/81, heart rate 70, respiratory rate 19, oxygen saturation 94%, her mucous membranes were moist, her lungs had bibasilar rales, no wheezing, heart S1-S2 present and rhythmic, no gallops or murmurs, lower extremity 2+ pitting edema bilaterally, her abdomen was soft nontender. Sodium 137, potassium 4.7, creatinine 1.5, BUN 33, glucose 189, white count 5.2, hemoglobin 13.5, hematocrit 40.7, platelets 233. Troponin 0.06, EKG shows sinus rhythm with a right axis deviation. Her chest x-ray showed increased vascular congestion, cephalization of the vasculature and bilateral small pleural effusions.  The patient was admitted to hospital with working diagnosis of decompensated systolic acute on chronic heart failure complicated by cardiogenic pulmonary edema.  1. Ischemic cardiomyopathy with decompensated systolic heart failure acute on chronic. Patient was aggressively diuresed with IV diuretics. She was initially  placed on bolus of furosemide and metolazone, posteriorly she was transitioned to a Lasix drip, she also required inotropic support with milrinone drip. Her admission weight was 121.6 kg her discharge weight is 101.8 kg. She will be continued torsemide every other day, Monday, Wednesday, Friday, continue Aldactone. Currently holding ACE inhibitor or beta blockade due to risk of hemodynamic decompensation. Echocardiogram from September 2017 showed ejection fraction 30-35%.  2. Acute kidney injury on chronic kidney disease stage III. Patient was aggressively diuresed, patient kidney function showed a worsening creatinine, peak at 1.7 by the time of discharge down to 1.6. Patient continued torsemide and Aldactone for diuresis. No potassium supplements due to risk of hyperkalemia. Patient will need a close follow-up kidney function and electrolytes.  3. Cardiogenic pulmonary edema. Patient was diuresed with significant improvement of her symptoms, by the time of discharge, her oxygenation was 99% on room air.  4. Type 2 diabetes mellitus. Patient was placed on insulin sliding-scale for glucose coverage and monitoring, she also was placed on insulin glargine 10 units for basal regimen. Her capillary glucose remained between 145 and 184 over last 24 hours. She will continue diet control as an outpatient.  5. History of hypothyroidism. Both TSH ( 5.5) and free T4 (1.16) were noted mildly elevated. Clinically patient stable and euthyroid, will recommend to follow-up on thyroid function test in one week.   Discharge Diagnoses:  Principal Problem:   Acute on chronic combined systolic and diastolic congestive heart failure (HCC) Active Problems:   Hyperlipidemia LDL goal <70   Type 2 diabetes, uncontrolled, with neuropathy (HCC)   Hypothyroidism   CKD (chronic kidney disease) stage 3, GFR 30-59 ml/min   CHF (congestive heart failure) [facility])    Discharge Instructions  Discharge Instructions  Diet -  low sodium heart healthy    Complete by:  As directed    Discharge instructions    Complete by:  As directed    Please follow with primary care in 7 days.   Increase activity slowly    Complete by:  As directed        Medication List    TAKE these medications   aspirin EC 81 MG tablet Take 1 tablet (81 mg total) by mouth daily.   atorvastatin 40 MG tablet Commonly known as:  LIPITOR Take 1 tablet (40 mg total) by mouth daily at 6 PM.   CENTRUM SILVER 50+WOMEN PO Take 1 tablet by mouth every morning.   gabapentin 300 MG capsule Commonly known as:  NEURONTIN Take 300 mg by mouth at bedtime.   glucose blood test strip Use TID   ONETOUCH VERIO w/Device Kit 1 Act by Does not apply route 3 (three) times daily.   spironolactone 25 MG tablet Commonly known as:  ALDACTONE Take 1 tablet (25 mg total) by mouth daily.   torsemide 20 MG tablet Commonly known as:  DEMADEX Take 2 tablets (40 mg total) by mouth 3 (three) times a week. Start taking on:  09/09/2016 What changed:  how much to take  how to take this  when to take this  additional instructions   traMADol 50 MG tablet Commonly known as:  ULTRAM Take 50 mg by mouth every 6 (six) hours as needed for moderate pain.      Follow-up Information    Glori Bickers, MD .   Specialty:  Cardiology Why:  office will contact you Contact information: [place] [id number] [phone]          Allergies  Allergen Reactions  . Lisinopril Nausea And Vomiting, Other (See Comments) and Cough    Coughing, throwing up, waking up coughing  . Metformin And Related Anaphylaxis    Consultations:  Cardiology   Procedures/Studies: Dg Chest 2 View  Result Date: 08/25/2016 CLINICAL DATA:  Initial evaluation for 1 week history of shortness of breath. EXAM: CHEST  2 VIEW COMPARISON:  Prior radiograph from 04/13/2016. FINDINGS: Moderate cardiomegaly, stable. Median sternotomy wires  underlying CABG markers and surgical clips noted. Mediastinal silhouette within normal limits. Aortic atherosclerosis noted. Lungs are normally inflated. Vascular congestion with indistinctness of the interstitial markings, suggestive of mild diffuse pulmonary interstitial edema. Small bilateral pleural effusions. No focal infiltrates. No pneumothorax. No acute osseous abnormality. IMPRESSION: 1. Cardiomegaly with mild diffuse pulmonary interstitial edema and small bilateral pleural effusions. 2. Sequela of prior CABG. 3. Aortic atherosclerosis. Electronically Signed   By: Jeannine Boga M.D.   On: 08/25/2016 17:34      Subjective: Patient feeling better, no dyspnea or chest pain. Positive weakness. Not willing to have home health or home physical therapy.   Discharge Exam: Vitals:   09/07/16 2038 09/08/16 0520  BP: 102/68 110/71  Pulse: 92 67  Resp: 18 18  Temp: 97.6 F (36.4 C) 97.7 F (36.5 C)   Vitals:   09/07/16 0633 09/07/16 1205 09/07/16 2038 09/08/16 0520  BP: 111/82 98/70 102/68 110/71  Pulse: (!) 47 (!) 52 92 67  Resp: '18 18 18 18  '$ Temp: 98.4 F (36.9 C) 97.5 F (36.4 C) 97.6 F (36.4 C) 97.7 F (36.5 C)  TempSrc: Oral Oral Oral Oral  SpO2: 100% 100% 99% 96%  Weight:    101.8 kg (224 lb 8 oz)  Height:  General: Pt is alert, awake, not in acute distress Cardiovascular: RRR, S1/S2 +, no rubs, no gallops Respiratory: CTA bilaterally, no wheezing, no rhonchi. Mild rales at bases.  Abdominal: Soft, NT, ND, bowel sounds + Extremities: Pitting edema + bilateral lower extremities, no cyanosis    The results of significant diagnostics from this hospitalization (including imaging, microbiology, ancillary and laboratory) are listed below for reference.     Microbiology: No results found for this or any previous visit (from the past 240 hour(s)).   Labs: BNP (last 3 results)  Recent Labs  04/11/16 1929 06/05/16 1230 08/25/16 2119  BNP 1,176.5* 906.9*  1,610.9*   Basic Metabolic Panel:  Recent Labs Lab 09/02/16 0432 09/03/16 0412  09/05/16 0410 09/05/16 1143 09/06/16 0348 09/07/16 0318 09/08/16 0421  NA 136 131*  < > 132* 130* 133* 134* 132*  K 3.6 3.4*  < > 2.7* 3.0* 3.1* 3.4* 3.1*  CL 89* 84*  < > 82* 82* 86* 89* 92*  CO2 35* 35*  < > 36* 35* 34* 33* 27  GLUCOSE 261* 210*  < > 159* 191* 172* 164* 139*  BUN 34* 33*  < > 44* 45* 48* 52* 56*  CREATININE 1.42* 1.44*  < > 1.52* 1.59* 1.57* 1.71* 1.62*  CALCIUM 10.2 9.4  < > 10.0 9.9 10.0 9.3 9.1  MG 1.8 2.3  --  2.4  --   --   --   --   < > = values in this interval not displayed. Liver Function Tests: No results for input(s): AST, ALT, ALKPHOS, BILITOT, PROT, ALBUMIN in the last 168 hours. No results for input(s): LIPASE, AMYLASE in the last 168 hours. No results for input(s): AMMONIA in the last 168 hours. CBC: No results for input(s): WBC, NEUTROABS, HGB, HCT, MCV, PLT in the last 168 hours. Cardiac Enzymes: No results for input(s): CKTOTAL, CKMB, CKMBINDEX, TROPONINI in the last 168 hours. BNP: Invalid input(s): POCBNP CBG:  Recent Labs Lab 09/07/16 1240 09/07/16 1624 09/07/16 2100 09/07/16 2141 09/08/16 0653  GLUCAP 211* 159* 184* 186* 145*   D-Dimer No results for input(s): DDIMER in the last 72 hours. Hgb A1c No results for input(s): HGBA1C in the last 72 hours. Lipid Profile No results for input(s): CHOL, HDL, LDLCALC, TRIG, CHOLHDL, LDLDIRECT in the last 72 hours. Thyroid function studies No results for input(s): TSH, T4TOTAL, T3FREE, THYROIDAB in the last 72 hours.  Invalid input(s): FREET3 Anemia work up No results for input(s): VITAMINB12, FOLATE, FERRITIN, TIBC, IRON, RETICCTPCT in the last 72 hours. Urinalysis    Component Value Date/Time   COLORURINE RED (A) 04/15/2016 1733   APPEARANCEUR CLOUDY (A) 04/15/2016 1733   LABSPEC 1.009 04/15/2016 1733   PHURINE 7.0 04/15/2016 1733   GLUCOSEU NEGATIVE 04/15/2016 1733   GLUCOSEU NEGATIVE  10/10/2014 1649   HGBUR LARGE (A) 04/15/2016 1733   BILIRUBINUR NEGATIVE 04/15/2016 1733   KETONESUR NEGATIVE 04/15/2016 1733   PROTEINUR NEGATIVE 04/15/2016 1733   UROBILINOGEN 1.0 05/29/2015 1414   NITRITE NEGATIVE 04/15/2016 1733   LEUKOCYTESUR SMALL (A) 04/15/2016 1733   Sepsis Labs Invalid input(s): PROCALCITONIN,  WBC,  LACTICIDVEN Microbiology No results found for this or any previous visit (from the past 240 hour(s)).   Time coordinating discharge: Over 30 minutes  SIGNED:   Tawni Millers, MD  [facility] 09/08/2016, 9:27 AM Pager   If 7PM-7AM, please contact night-coverage [email or web address] Password [email or web address]

## 2016-09-09 ENCOUNTER — Encounter: Payer: Self-pay | Admitting: Internal Medicine

## 2016-09-09 ENCOUNTER — Encounter (HOSPITAL_COMMUNITY): Admission: RE | Admit: 2016-09-09 | Payer: Medicare Other | Source: Ambulatory Visit

## 2016-09-09 MED FILL — ATORVASTATIN 40 MG TABLET: 40 | 30 days supply | Qty: 30 | Fill #0

## 2016-09-09 MED FILL — SPIRONOLACTONE 25 MG TABLET: 25 | 30 days supply | Qty: 30 | Fill #0

## 2016-09-09 MED FILL — TORSEMIDE 20 MG TABLET: 20 | 28 days supply | Qty: 24 | Fill #0

## 2016-09-11 ENCOUNTER — Encounter (HOSPITAL_COMMUNITY): Payer: Medicare Other

## 2016-09-12 ENCOUNTER — Ambulatory Visit (HOSPITAL_COMMUNITY)
Admission: RE | Admit: 2016-09-12 | Discharge: 2016-09-12 | Disposition: A | Discharge status: Home / Self Care | Payer: Medicare Other | Source: Ambulatory Visit | Attending: Internal Medicine | Admitting: Internal Medicine

## 2016-09-12 VITALS — BP 110/84 | HR 78 | Wt 227.8 lb

## 2016-09-12 DIAGNOSIS — N179 Acute kidney failure, unspecified: Secondary | ICD-10-CM | POA: Diagnosis not present

## 2016-09-12 DIAGNOSIS — I5023 Acute on chronic systolic (congestive) heart failure: Secondary | ICD-10-CM | POA: Diagnosis not present

## 2016-09-12 DIAGNOSIS — E1165 Type 2 diabetes mellitus with hyperglycemia: Secondary | ICD-10-CM | POA: Insufficient documentation

## 2016-09-12 DIAGNOSIS — I272 Pulmonary hypertension, unspecified: Secondary | ICD-10-CM | POA: Diagnosis not present

## 2016-09-12 DIAGNOSIS — N183 Chronic kidney disease, stage 3 unspecified: Secondary | ICD-10-CM

## 2016-09-12 DIAGNOSIS — Z7982 Long term (current) use of aspirin: Secondary | ICD-10-CM | POA: Insufficient documentation

## 2016-09-12 DIAGNOSIS — IMO0002 Reserved for concepts with insufficient information to code with codable children: Secondary | ICD-10-CM

## 2016-09-12 DIAGNOSIS — I130 Hypertensive heart and chronic kidney disease with heart failure and stage 1 through stage 4 chronic kidney disease, or unspecified chronic kidney disease: Secondary | ICD-10-CM | POA: Insufficient documentation

## 2016-09-12 DIAGNOSIS — I2510 Atherosclerotic heart disease of native coronary artery without angina pectoris: Secondary | ICD-10-CM | POA: Diagnosis not present

## 2016-09-12 DIAGNOSIS — I255 Ischemic cardiomyopathy: Secondary | ICD-10-CM | POA: Diagnosis not present

## 2016-09-12 DIAGNOSIS — Z79899 Other long term (current) drug therapy: Secondary | ICD-10-CM | POA: Insufficient documentation

## 2016-09-12 DIAGNOSIS — E785 Hyperlipidemia, unspecified: Secondary | ICD-10-CM

## 2016-09-12 DIAGNOSIS — Z951 Presence of aortocoronary bypass graft: Secondary | ICD-10-CM | POA: Diagnosis not present

## 2016-09-12 DIAGNOSIS — E1122 Type 2 diabetes mellitus with diabetic chronic kidney disease: Secondary | ICD-10-CM | POA: Diagnosis not present

## 2016-09-12 DIAGNOSIS — E1140 Type 2 diabetes mellitus with diabetic neuropathy, unspecified: Secondary | ICD-10-CM

## 2016-09-12 DIAGNOSIS — I252 Old myocardial infarction: Secondary | ICD-10-CM | POA: Insufficient documentation

## 2016-09-12 DIAGNOSIS — I5022 Chronic systolic (congestive) heart failure: Secondary | ICD-10-CM | POA: Diagnosis present

## 2016-09-12 LAB — BASIC METABOLIC PANEL
ANION GAP: 11 (ref 5–15)
BUN: 58 mg/dL — ABNORMAL HIGH (ref 6–20)
CHLORIDE: 96 mmol/L — AB (ref 101–111)
CO2: 25 mmol/L (ref 22–32)
CREATININE: 1.55 mg/dL — AB (ref 0.44–1.00)
Calcium: 9.8 mg/dL (ref 8.9–10.3)
GFR calc non Af Amer: 34 mL/min — ABNORMAL LOW (ref >60)
GFR, EST AFRICAN AMERICAN: 39 mL/min — AB (ref >60)
GLUCOSE: 306 mg/dL — AB (ref 65–99)
Potassium: 3.6 mmol/L (ref 3.5–5.1)
Sodium: 132 mmol/L — ABNORMAL LOW (ref 135–145)

## 2016-09-12 MED ORDER — ATORVASTATIN CALCIUM 40 MG PO TABS: 40.0000 mg | ORAL_TABLET | Freq: Every day | ORAL | 11 refills | Status: DC

## 2016-09-12 MED ORDER — TORSEMIDE 20 MG PO TABS: 40.0000 mg | ORAL_TABLET | ORAL | 6 refills | Status: DC

## 2016-09-12 NOTE — Patient Instructions (Addendum)
Continue Torsemide 40 mg, three times per week Mon/Wed/Fri Restart Atorvastatin 40 mg, one tab daily   Your physician recommends that you schedule a follow-up appointment in: 2 weeks with Joanell RisingAndy Tillery,PA  Labs today We will only contact you if something comes back abnormal or we need to make some changes. Otherwise no news is good news!   hot flashes

## 2016-09-12 NOTE — Progress Notes (Signed)
Patient ID: Christine Knight, female   DOB: 02-04-1951, 65 y.o.   MRN: [id number]     Advanced Heart Failure Clinic Note   PCP: Roque Cash  HF: Dr. Haroldine Laws   HPI: Christine Knight is a 65 y.o. female w/ PMHx significant for morbid obesity, systolic HF due to ICM (23/76 EF 25%; 07/2012 EF 35%; 03/18/13 EF 20-25%), CAD with h/o NSTEMI s/p CABG Oct 2012, pulmonary hypertension (PA peak 63mHg), and poorly controlled DM 2.  [facility] edema, and weight gain.  Lasix gtt used and she diuresed 15 liters. Diuresed 29 pounds.  ECHO- EF 20-25%.  Discharge weight 264 pounds. Christine Harmanstopped d/t dizziness and hyperkalemia   Admitted 12/08/2015 - 12/28/2015 with worsening DOE and cough. Stated she had a 40 lb weight since 07/2015. She was diuresed aggressively with lasix gtt at 30 mg/hr and metolazone 5 mg BID. Diamox eventually added. Diuretics held temporarily towards end of stay with worsening Creatinine. Overall she diuresed >40 L and was down 70 lbs from admission weight. Pt had R/LHC with preserved CO and mild/mod Pulm HTN. Severe native CAD with patent CABG grafts. Discharge weight 237 lbs.   Admitted 6/1 -04/26/2016 with marked volume overload, low output hf, cardiorenal syndrome. Diuresed with high dose lasix 160 mg three times a day. Diuresed over 50 pounds. Transitioned to torsemide 80 mg daily. BB and losartan stopped. Discharge weight 222 pounds   Admitted 10/15 - 09/08/16 with marked volume overload. PT diuresed with high dose IV lasix, metolazone, as well as Milrinone to augment diuresis. Diamox also added.  Pt diuresed 35 L and lost 44 lbs. Pt transitioned back to po torsemide, and had to be adjusted further with AKI.    She presents for post hospital follow up.  She has some confusion on her medicines (states she couldn't find it on her discharge summary).  She is taking torsemide 40 mg three times a week as directed.  States she has no atorvastatin. She is getting  lightheadedness with rapid or prolonged standing. Breathing is better. No SOB at all walking around the house. Wants to get back into Cardiac Rehab. Weight at home 224 this morning. (was 252 last time she saw Christine Knight.   Labs 2/17 K 4.3, Cr 1.56 Labs 04/26/2016: K 3.1 Creatinine 1.78 K 3.1 Labs 06/05/2016: K 4.3 Creatinine 1.25 Labas 06/13/2016: K 4.0 Creatinine 1.49  Labs 07/31/2016: K 4.5 Creatinine 2.27  Labs  08/05/2016: K 5.0 Creatinine 3.18   03/18/13 ECHO EF 20-25% 06/17/2013 ECHO EF 35% RV ok (read formally after visit 35-40%) 03/15/2015 ECHO EF 30-35%, grade 3 DD, Moderate TR, Severely reduced RV, PA peak pressure 81 mm Hg 12/09/2015 ECHO EF 15-20%, Decreased LV diastolic compliance, RV mod reduced, PA peak 57 mmHg, Severe TR  04/14/16 Echo 15-20%   R/LHC 12/13/15 Hemodynamics RA 25/25 (23) RV pressure 92/13  RV EDP 26 PA pressure 86/34 (56) PW mean 37 AO Pressure 114/72 (88) LV Pressure 101/12 (26) LV EDP 26 PVR 3.24 Fick CO/CI 5.85 / 2.41L/min   Prox LAD lesion, 100% stenosed. Prox Cx lesion, 99% stenosed. Mid RCA lesion, 80% stenosed. Dist RCA lesion, 100% stenosed. Patent grafts  ROS: All systems negative except as listed in HPI, PMH and Problem List.  Past Medical History:  Diagnosis Date  . CAD (coronary artery disease) 08/2011   NSTEMI with subsequent CABG 08/13/2011 with LIMA-LAD, SVG-diagonal, SVG-OM, SVG-PDA  . Carpal tunnel syndrome on both sides    "post  OHS in 08/2011; resolved now" (03/17/2013)  . CHF (congestive heart failure) ([place])   . Chronic systolic heart failure (HCC)    Chronic systolic CHF  . Diabetes mellitus type 2 in obese ([place]) 08/10/11  . Dyspnea   . Ejection fraction < 50%    EF 20%, October, 2012  //  EF 25% December, 2012  //   EF 35%, echo, September, 2013  . Hyperlipidemia 08/10/11  . Hypertension   . Ischemic cardiomyopathy 08/13/2011   EF 20% in 08/2011, still 25% 10/21/2011  . Mitral regurgitation    Mild by echo 10/21/11  . Myocardial  infarction 08/2011  . Obesity, morbid ([place])   . Orthopnoea    "progressively worse over last 3 wks" (03/17/2013)  . Pleural effusion    Requiring L thoracentesis 09/02/11  . Pulmonary hypertension    Echo, September, 2013, 72 mmHg.    Current Outpatient Prescriptions  Medication Sig Dispense Refill  . aspirin 81 MG tablet Take 1 tablet (81 mg total) by mouth daily. 30 tablet 9  . Blood Glucose Monitoring Suppl (ONETOUCH VERIO) W/DEVICE KIT 1 Act by Does not apply route 3 (three) times daily. 2 kit 0  . gabapentin (NEURONTIN) 300 MG capsule Take 300 mg by mouth at bedtime.    Marland Kitchen glucose blood test strip Use TID 100 each 12  . Multiple Vitamins-Minerals (CENTRUM SILVER 50+WOMEN PO) Take 1 tablet by mouth every morning.    . torsemide (DEMADEX) 20 MG tablet Take 2 tablets (40 mg total) by mouth 3 (three) times a week. 30 tablet 0  . traMADol (ULTRAM) 50 MG tablet Take 50 mg by mouth every 6 (six) hours as needed for moderate pain.      No current facility-administered medications for this encounter.      PHYSICAL EXAM: Vitals:   09/12/16 1448  BP: 118/72  BP Location: Left Arm  Patient Position: Sitting  Cuff Size: Large  Pulse: 78  SpO2: 95%  Weight: 227 lb 12.8 oz (103.3 kg)   Wt Readings from Last 3 Encounters:  09/12/16 227 lb 12.8 oz (103.3 kg)  09/08/16 224 lb 8 oz (101.8 kg)  08/07/16 253 lb 3.2 oz (114.9 kg)     General:NAD. Ambulated in the clinic without difficulty.  HEENT: normal Neck: JVP difficult to assess, but does not appear elevated. Carotids 2+ bilat; no bruits. No thyromegaly or nodule noted.  Cor: PMI nondisplaced. Distant, RRR. No M/G/R Lungs: CTAB, normal effort.  Abdomen: Morbidly obese, soft, NT, ND, no HSM. No bruits or masses. +BS  Extremities: no cyanosis, clubbing, rash. Trace BLE edema. Neuro: alert & orientedx3, cranial nerves grossly intact. moves all 4 extremities w/o difficulty. Affect pleasant  ASSESSMENT/PLAN 1. Chronic systolic HF due  to iCM EF 15-20% Echo 04/14/2016. - ECHO 07/2016 at [facility]- EF 30-35% RV normal.  - Recent hospital admission again complicated with marked volume overload, low output, and cardiorenal syndrome.  - NYHA II - III currently. Volume status stable from discharge.  - No BB with recurrent low output.  - Currently off entresto and spiro due to recent AKI and hyperkalemia..  - Continue torsemide 40 mg M/W-F. No K for now.  - Intolerant bidil but difficulty with dizziness and hypotension.  --2. CAD s/p NSTEMI and CABG 10/12  - Severe native CAD with patent grafts on [facility] 12/13/15. No CP - Continue ASA 81 mg BID and atorvastatin 10 mg daily.  3. Morbid obesity - Encouraged to watch portions  and increase activity.   - OK for patient to restart cardiac rehab.  4. Pulmonary HTN - mild to mod RHC 12/13/15. PVR 3.24.  5. AKI on CKD stage III- Creatinine 1.49 >2.27>3.1 -> 2.2 6. DM2 - PCP following.  7. Day time fatigue- Offered sleep study. PCP plans to work up.    Pt is doing well s/p d/c.  Will check labs today with recent AKI, diuretics adjustment, and hypokalemia on discharge.     Will keep close follow up in 2 weeks for now.   Shirley Friar, PA-C   2:59 PM

## 2016-09-13 ENCOUNTER — Encounter (HOSPITAL_COMMUNITY): Payer: Medicare Other

## 2016-09-14 ENCOUNTER — Telehealth (HOSPITAL_COMMUNITY): Payer: Self-pay | Admitting: *Deleted

## 2016-09-14 NOTE — Telephone Encounter (Signed)
-----   Message from Graciella FreerMichael Andrew Tillery, PA-C sent at 09/12/2016  7:52 PM EDT ----- Regarding: RE: ok to return to rehab Thank you for checking in!  I had to run out early today to pick up my daughter.   [name] is free to return to Cardiac Rehab at any time. I told her this at her visit today, and she stated she would call you all (which this may be in reference too).  Thank you!   Casimiro NeedleMichael [address]"Andy" Elizabethtonillery, PA-C 09/12/2016 7:52 PM   ----- Message ----- From: Chelsea Ausarlette B Vassie Kugel, RN Sent: 09/12/2016   7:46 PM To: Graciella FreerMichael Andrew Tillery, PA-C Subject: ok to return to rehab                          Pt with extended hospitalization seen in follow up by you.  When may pt return to rehab?  Pt has until 11/22 to participate in cardiac rehab. (18 weeks to complete 12 week program per medicare guidelines)  Pt making very slow progress has attend 10 sessions since starting rehab 14 weeks ago.  Thanks for the advisement Pepsi[employer]Carlette Issac Moure RN

## 2016-09-16 ENCOUNTER — Encounter (HOSPITAL_COMMUNITY): Payer: Self-pay | Admitting: *Deleted

## 2016-09-16 ENCOUNTER — Encounter (HOSPITAL_COMMUNITY): Payer: Medicare Other

## 2016-09-16 ENCOUNTER — Telehealth (HOSPITAL_COMMUNITY): Payer: Self-pay | Admitting: *Deleted

## 2016-09-16 NOTE — Telephone Encounter (Signed)
Returned pt call regarding returning to rehab.  Advised pt that we had received the ok for pt to return after her hospitalization.  Pt further advised that she would have until 11/22 to attend due to insurance reimbursement for the cardiac rehab 12 week program.  Pt oriented on 8/31. Pt unable to return to exercise this week and would only get 1 or 2 sessions in before the 11/22. Mutually decided that pt would continue to exercise at [employer] which offers her flexibility she desires. Alanson Alyarlette Carlton RN, BSN

## 2016-09-16 NOTE — Progress Notes (Signed)
Discharge Summary  Patient Details  Name: Christine Knight MRN: [id number] Date of Birth: 09/19/1951 Referring Provider:   Flowsheet Row CARDIAC REHAB PHASE II ORIENTATION [facility]  Referring Provider  Arvilla MeresBensimhon, Daniel MD       Number of Visits:   Reason for Discharge: 6 Early Discharge Pt received clearance to return back to exercise, however pt has until 11/22 per insurance reimbursement.  Pt with other plans and would not be able to return. [facility] where she has  more flexibility in exercise. Smoking History:  History  Smoking Status  . Former Smoker  . Types: Cigarettes  . Quit date: 11/11/1978  Smokeless Tobacco  . Never Used    Comment: 03/17/2013 "only a social smoker when I did smoke; ever bought any"    Diagnosis:  No diagnosis found.  ADL UCSD:   Initial Exercise Prescription:   Discharge Exercise Prescription (Final Exercise Prescription Changes):     Exercise Prescription Changes - 08/19/16 1700      Exercise Review   Progression --     Response to Exercise   Blood Pressure (Admit) 110/72   Blood Pressure (Exercise) 124/78   Blood Pressure (Exit) 108/70   Heart Rate (Admit) 54 bpm   Heart Rate (Exercise) 90 bpm   Heart Rate (Exit) 53 bpm   Rating of Perceived Exertion (Exercise) 14   Comments Reviewed home exercise guidelines on 08/12/16.   Duration Progress to 30 minutes of continuous aerobic without signs/symptoms of physical distress   Intensity THRR unchanged     Progression   Progression Continue to progress workloads to maintain intensity without signs/symptoms of physical distress.   Average METs 1.8     Resistance Training   Training Prescription Yes   Weight 2lbs   Reps 10-12     Interval Training   Interval Training No     Recumbant Bike   Level 1   Watts 8   Minutes 10   METs 1.4     NuStep   Level 2   Minutes 10   METs 1.5     Track    Laps 8   Minutes 10   METs 2.39     Home Exercise Plan   [facility]   Frequency Add 2 additional days to program exercise sessions.      Functional Capacity:   Psychological, QOL, Others - Outcomes: PHQ 2/9: Depression screen [facility] 2/9 01/04/2016 02/17/2012  Decreased Interest 0 0  Down, Depressed, Hopeless 0 0  PHQ - 2 Score 0 0    Quality of Life:     Quality of Life - 07/29/16 0802      Quality of Life Scores   Health/Function Pre 25.6 %   Socioeconomic Pre 22.75 %   Psych/Spiritual Pre 28.29 %   Family Pre 24 %   GLOBAL Pre 25.26 %      Personal Goals: Goals established at orientation with interventions provided to work toward goal.    Personal Goals Discharge:     Goals and Risk Factor Review    Row Name 07/26/16 0726 08/19/16 1704 09/23/16 0920 09/30/16 0917       Core Components/Risk Factors/Patient Goals Review   Personal Goals Review Other Other Other Weight Management/Obesity    Review Patient off to a slow start with exercise due to low pre-exercise blood sugar readings.  Patient is walking  and going [facility] [employer] [facility] [employer] [facility] exercise program. Patient discharged due to non-attendance/re-admission. Pt has gained 15.9 lb since admission.    Expected Outcomes Begin regular exercise routine to improve cardiorespiratory fitness and help achieve weight loss goals. Maintain consistent exercise routine to improve cardiorespiratory fitness and help achieve weight loss goals. Resume exercise at home as tolerated. Resume health eating and exercise at home as tolerated.       Nutrition & Weight - Outcomes:      Post Biometrics - 08/12/16 1125       Post  Biometrics   Height 5' 5.5" (1.664 m)   Weight 255 lb 8.2 oz (115.9 kg)   BMI (Calculated) 42      Nutrition:   Nutrition Discharge:     Nutrition Assessments - 09/30/16 0914      MEDFICTS Scores   Pre Score 31      Education Questionnaire  Score:     Knowledge Questionnaire Score - 07/29/16 0802      Knowledge Questionnaire Score   Pre Score 23/24       Pt discharged  from cardiac rehab program today with completion of 6 exercise sessions in Phase II during a 12 week period of time for medicaid reimbursement.  Pt with sporadic attendance due to medical issues and hospitalization.  Pt made very little progress due to inconsistent attendance.  Pt would benefit from continued medical support and encouragement.  Pt plans to continue exercise at [employer]. Alanson Alyarlette Carlton RN, BSN

## 2016-09-18 ENCOUNTER — Encounter (HOSPITAL_COMMUNITY): Payer: Medicare Other

## 2016-09-19 MED FILL — GABAPENTIN 300 MG CAPSULE: 300 | 20 days supply | Qty: 60 | Fill #0

## 2016-09-20 ENCOUNTER — Encounter (HOSPITAL_COMMUNITY): Payer: Medicare Other

## 2016-09-23 ENCOUNTER — Encounter (HOSPITAL_COMMUNITY): Payer: Medicare Other

## 2016-09-23 NOTE — Addendum Note (Signed)
Encounter addended by: Artist Paislinty M Tahtiana Rozier on: 09/23/2016  9:43 AM<BR>    Actions taken: Flowsheet data copied forward, Visit Navigator Flowsheet section accepted, Vitals modified

## 2016-09-25 ENCOUNTER — Encounter (HOSPITAL_COMMUNITY): Payer: Medicare Other

## 2016-09-26 ENCOUNTER — Encounter (HOSPITAL_COMMUNITY): Payer: Self-pay

## 2016-09-26 ENCOUNTER — Ambulatory Visit (HOSPITAL_COMMUNITY)
Admission: RE | Admit: 2016-09-26 | Discharge: 2016-09-26 | Disposition: A | Discharge status: Home / Self Care | Payer: Medicare Other | Source: Ambulatory Visit | Attending: Internal Medicine | Admitting: Internal Medicine

## 2016-09-26 VITALS — BP 122/70 | HR 78 | Wt 241.4 lb

## 2016-09-26 DIAGNOSIS — N183 Chronic kidney disease, stage 3 unspecified: Secondary | ICD-10-CM

## 2016-09-26 DIAGNOSIS — I2510 Atherosclerotic heart disease of native coronary artery without angina pectoris: Secondary | ICD-10-CM | POA: Insufficient documentation

## 2016-09-26 DIAGNOSIS — I252 Old myocardial infarction: Secondary | ICD-10-CM | POA: Diagnosis not present

## 2016-09-26 DIAGNOSIS — I255 Ischemic cardiomyopathy: Secondary | ICD-10-CM | POA: Insufficient documentation

## 2016-09-26 DIAGNOSIS — Z951 Presence of aortocoronary bypass graft: Secondary | ICD-10-CM | POA: Insufficient documentation

## 2016-09-26 DIAGNOSIS — N179 Acute kidney failure, unspecified: Secondary | ICD-10-CM | POA: Insufficient documentation

## 2016-09-26 DIAGNOSIS — Z7982 Long term (current) use of aspirin: Secondary | ICD-10-CM | POA: Diagnosis not present

## 2016-09-26 DIAGNOSIS — I272 Pulmonary hypertension, unspecified: Secondary | ICD-10-CM | POA: Diagnosis not present

## 2016-09-26 DIAGNOSIS — E1165 Type 2 diabetes mellitus with hyperglycemia: Secondary | ICD-10-CM | POA: Insufficient documentation

## 2016-09-26 DIAGNOSIS — I130 Hypertensive heart and chronic kidney disease with heart failure and stage 1 through stage 4 chronic kidney disease, or unspecified chronic kidney disease: Secondary | ICD-10-CM | POA: Insufficient documentation

## 2016-09-26 DIAGNOSIS — R5383 Other fatigue: Secondary | ICD-10-CM | POA: Diagnosis not present

## 2016-09-26 DIAGNOSIS — I5022 Chronic systolic (congestive) heart failure: Secondary | ICD-10-CM | POA: Insufficient documentation

## 2016-09-26 DIAGNOSIS — Z9111 Patient's noncompliance with dietary regimen: Secondary | ICD-10-CM | POA: Diagnosis not present

## 2016-09-26 DIAGNOSIS — E1122 Type 2 diabetes mellitus with diabetic chronic kidney disease: Secondary | ICD-10-CM | POA: Diagnosis not present

## 2016-09-26 DIAGNOSIS — Z79899 Other long term (current) drug therapy: Secondary | ICD-10-CM | POA: Diagnosis not present

## 2016-09-26 LAB — BASIC METABOLIC PANEL
ANION GAP: 9 (ref 5–15)
BUN: 36 mg/dL — ABNORMAL HIGH (ref 6–20)
CALCIUM: 9.4 mg/dL (ref 8.9–10.3)
CO2: 23 mmol/L (ref 22–32)
CREATININE: 1.7 mg/dL — AB (ref 0.44–1.00)
Chloride: 104 mmol/L (ref 101–111)
GFR, EST AFRICAN AMERICAN: 35 mL/min — AB (ref >60)
GFR, EST NON AFRICAN AMERICAN: 30 mL/min — AB (ref >60)
Glucose, Bld: 210 mg/dL — ABNORMAL HIGH (ref 65–99)
Potassium: 5.5 mmol/L — ABNORMAL HIGH (ref 3.5–5.1)
SODIUM: 136 mmol/L (ref 135–145)

## 2016-09-26 LAB — BRAIN NATRIURETIC PEPTIDE: B NATRIURETIC PEPTIDE 5: 1058.8 pg/mL — AB (ref 0.0–100.0)

## 2016-09-26 MED ORDER — TORSEMIDE 20 MG PO TABS: 40.0000 mg | ORAL_TABLET | Freq: Every day | ORAL | 6 refills | Status: DC

## 2016-09-26 NOTE — Progress Notes (Signed)
I spoke with [name] in the AHF Clinic during her visit.  Her weight is up today and APP wanted me to review her diet and other HF recommendations for home.  I encouraged daily weights and reviewed how weight increases relate to the signs and symptoms of HF as well as when appropriate to contact the physician.  She admits that she "loves pickles" and often eats salads with Blue cheese dressing as well as adds salt to certain foods.  I reinforced that she should avoid these foods as well as added salt.  I also asked her to limit sodium to 2000 mg per day and carefully review labels for sodium content.  She is quite upset at her weight increase and rightfully concerned about her health. She refuses [facility] secondary to being "on the go" and is often not home during the day.  I encouraged her to call me back with any concerns or questions related to her HF.

## 2016-09-26 NOTE — Progress Notes (Signed)
Advanced Heart Failure Medication Review by a Pharmacist  Does the patient  feel that his/her medications are working for him/her?  yes  Has the patient been experiencing any side effects to the medications prescribed?  no  Does the patient measure his/her own blood pressure or blood glucose at home?  yes   Does the patient have any problems obtaining medications due to transportation or finances?   no  Understanding of regimen: good Understanding of indications: good Potential of compliance: good Patient understands to avoid NSAIDs. Patient understands to avoid decongestants.  Issues to address at subsequent visits: None   Pharmacist comments:  Ms. Armanda HeritageMininall is a pleasant 65 yo F presenting without a medication list but with good recall of her regimen. She reports good compliance with her regimen but did not start her atorvastatin because the pharmacy never filled it for her. I have called our Outpatient Pharmacy and asked them to fill it for her today.   Tyler DeisErika K. Bonnye FavaNicolsen, PharmD, BCPS, CPP Clinical Pharmacist Pager: [phone] Phone: [phone] 09/26/2016 1:55 PM       Time with patient: 10 minutes Preparation and documentation time: 4 minutes Total time: 14 minutes

## 2016-09-26 NOTE — Progress Notes (Signed)
Patient ID: Christine Knight, female   DOB: 1951-09-24, 65 y.o.   MRN: [id number]     Advanced Heart Failure Clinic Note   PCP: Roque Cash  HF: Dr. Haroldine Laws   HPI: Christine Knight is a 65 y.o. female w/ PMHx significant for morbid obesity, systolic HF due to ICM (25/42 EF 25%; 07/2012 EF 35%; 03/18/13 EF 20-25%), CAD with h/o NSTEMI s/p CABG Oct 2012, pulmonary hypertension (PA peak 43mHg), and poorly controlled DM 2.  [facility], and weight gain.  Lasix gtt used and she diuresed 15 liters. Diuresed 29 pounds.  ECHO- EF 20-25%.  Discharge weight 264 pounds. Christine Harmanstopped d/t dizziness and hyperkalemia   Admitted 12/08/2015 - 12/28/2015 with worsening DOE and cough. Stated she had a 40 lb weight since 07/2015. She was diuresed aggressively with lasix gtt at 30 mg/hr and metolazone 5 mg BID. Diamox eventually added. Diuretics held temporarily towards end of stay with worsening Creatinine. Overall she diuresed >40 L and was down 70 lbs from admission weight. Pt had R/LHC with preserved CO and mild/mod Pulm HTN. Severe native CAD with patent CABG grafts. Discharge weight 237 lbs.   Admitted 6/1 -04/26/2016 with marked volume overload, low output hf, cardiorenal syndrome. Diuresed with high dose lasix 160 mg three times a day. Diuresed over 50 pounds. Transitioned to torsemide 80 mg daily. BB and losartan stopped. Discharge weight 222 pounds   Admitted 10/15 - 09/08/16 with marked volume overload. PT diuresed with high dose IV lasix, metolazone, as well as Milrinone to augment diuresis. Diamox also added.  Pt diuresed 35 L and lost 44 lbs. Pt transitioned back to po torsemide, and had to be adjusted further with AKI.  Discharge weight 224 lbs.   She presents for regular follow up.  Weight up 14 lbs from last visit, up 17 lbs from discharge. Weight at home 236 this am (was 224 at home last visit).  Says her breathing is more labored, occasionally getting SOB walking  around the house. SOB walking from her car to the elevator in the underground parking lot. No orthopnea. + bendopnea.  Waiting to get back into Cardiac Rehab. Pt drinking > 2 L a day and regularly adding additional salts to her foods.   Labs 2/17 K 4.3, Cr 1.56 Labs 04/26/2016: K 3.1 Creatinine 1.78 K 3.1 Labs 06/05/2016: K 4.3 Creatinine 1.25 Labas 06/13/2016: K 4.0 Creatinine 1.49  Labs 07/31/2016: K 4.5 Creatinine 2.27  Labs  08/05/2016: K 5.0 Creatinine 3.18   03/18/13 ECHO EF 20-25% 06/17/2013 ECHO EF 35% RV ok (read formally after visit 35-40%) 03/15/2015 ECHO EF 30-35%, grade 3 DD, Moderate TR, Severely reduced RV, PA peak pressure 81 mm Hg 12/09/2015 ECHO EF 15-20%, Decreased LV diastolic compliance, RV mod reduced, PA peak 57 mmHg, Severe TR  04/14/16 Echo 15-20%   R/LHC 12/13/15 Hemodynamics RA 25/25 (23) RV pressure 92/13  RV EDP 26 PA pressure 86/34 (56) PW mean 37 AO Pressure 114/72 (88) LV Pressure 101/12 (26) LV EDP 26 PVR 3.24 Fick CO/CI 5.85 / 2.41L/min   Prox LAD lesion, 100% stenosed. Prox Cx lesion, 99% stenosed. Mid RCA lesion, 80% stenosed. Dist RCA lesion, 100% stenosed. Patent grafts  ROS: All systems negative except as listed in HPI, PMH and Problem List.  Past Medical History:  Diagnosis Date  . CAD (coronary artery disease) 08/2011   NSTEMI with subsequent CABG 08/13/2011 with LIMA-LAD, SVG-diagonal, SVG-OM, SVG-PDA  . Carpal tunnel syndrome on  both sides    "post OHS in 08/2011; resolved now" (03/17/2013)  . CHF (congestive heart failure) ([place])   . Chronic systolic heart failure (HCC)    Chronic systolic CHF  . Diabetes mellitus type 2 in obese ([place]) 08/10/11  . Dyspnea   . Ejection fraction < 50%    EF 20%, October, 2012  //  EF 25% December, 2012  //   EF 35%, echo, September, 2013  . Hyperlipidemia 08/10/11  . Hypertension   . Ischemic cardiomyopathy 08/13/2011   EF 20% in 08/2011, still 25% 10/21/2011  . Mitral regurgitation    Mild by echo 10/21/11    . Myocardial infarction 08/2011  . Obesity, morbid ([place])   . Orthopnoea    "progressively worse over last 3 wks" (03/17/2013)  . Pleural effusion    Requiring L thoracentesis 09/02/11  . Pulmonary hypertension    Echo, September, 2013, 72 mmHg.    Current Outpatient Prescriptions  Medication Sig Dispense Refill  . aspirin 81 MG tablet Take 1 tablet (81 mg total) by mouth daily. 30 tablet 9  . Blood Glucose Monitoring Suppl (ONETOUCH VERIO) W/DEVICE KIT 1 Act by Does not apply route 3 (three) times daily. 2 kit 0  . gabapentin (NEURONTIN) 300 MG capsule Take 300 mg by mouth 3 (three) times daily.     Marland Kitchen glucose blood test strip Use TID 100 each 12  . Multiple Vitamins-Minerals (CENTRUM SILVER 50+WOMEN PO) Take 1 tablet by mouth every morning.    . torsemide (DEMADEX) 20 MG tablet Take 40 mg by mouth 3 (three) times a week. Monday, Wednesday, Friday    . traMADol (ULTRAM) 50 MG tablet Take 50 mg by mouth every 6 (six) hours as needed for moderate pain.     Marland Kitchen atorvastatin (LIPITOR) 40 MG tablet Take 1 tablet (40 mg total) by mouth daily. (Patient not taking: Reported on 09/26/2016) 30 tablet 11   No current facility-administered medications for this encounter.      PHYSICAL EXAM: Vitals:   09/26/16 1345  BP: 122/70  BP Location: Left Arm  Patient Position: Sitting  Cuff Size: Large  Pulse: 78  SpO2: 93%  Weight: 241 lb 6.4 oz (109.5 kg)   Wt Readings from Last 3 Encounters:  09/26/16 241 lb 6.4 oz (109.5 kg)  09/12/16 227 lb 12.8 oz (103.3 kg)  09/08/16 224 lb 8 oz (101.8 kg)     General:NAD. Ambulated in the clinic without difficulty.  HEENT: Normal Neck: JVP difficult due to body habits, looks at least 10 cm +. Carotids 2+ bilat; no bruits. No thyromegaly or nodule noted.  Cor: PMI nondisplaced. Distant, RRR. No M/G/R Lungs: Mildly diminished basilar sounds.  Abdomen: Morbidly obese, soft, NT, ND, no HSM. No bruits or masses. +BS  Extremities: no cyanosis, clubbing,  rash. 1+ ankle edema.  Neuro: alert & orientedx3, cranial nerves grossly intact. moves all 4 extremities w/o difficulty. Affect pleasant  ASSESSMENT/PLAN 1. Chronic systolic HF due to iCM EF 15-20% Echo 04/14/2016. - [facility] 07/2016 [facility]- EF 30-35% RV normal.  - Recent hospital admission again complicated with marked volume overload, low output, and cardiorenal syndrome.  - NYHA III now and getting some of her volume back in the setting of dietary non-compliance.  - Increase torsemide to 40 mg daily. Had long talk with patient about diet and compliance, as did HF Nurse Navigator. .   - No BB with recurrent low output.  - Currently off entresto and  spiro due to recent AKI and hyperkalemia.Marland Kitchen  - Intolerant bidil with difficulty with dizziness and hypotension.  2. CAD s/p NSTEMI and CABG 10/12  - Severe native CAD with patent grafts on LHC 12/13/15. No CP - Continue ASA 81 mg BID and atorvastatin 10 mg daily.  3. Morbid obesity - Encouraged to watch portions and increase activity.   - Encouraged to restart cardiac rehab.   4. Pulmonary HTN - mild to mod RHC 12/13/15. PVR 3.24.  5. AKI on CKD stage III - BMET today.  6. DM2 - PCP following.  7. Day time fatigue - Have previously offered sleep study. PCP plans to work up.     She is volume overloaded in the setting of dietary non-compliance. Increase torsemide as above. Labs as above. HF navigator also had long discussion with patient about limiting salt, fluid to < 2L , and the importance of daily weights.   Shirley Friar, PA-C   2:02 PM  Total time spent> 25 minutes. Over half that spent discussing the above.

## 2016-09-26 NOTE — Patient Instructions (Signed)
INCREASE Torsemide to 40 mg, one tab daily  Labs today We will only contact you if something comes back abnormal or we need to make some changes. Otherwise no news is good news!   Your physician recommends that you schedule a follow-up appointment in: 2 weeks with Joanell RisingAndy Tillery,PA  Do the following things EVERYDAY: 1) Weigh yourself in the morning before breakfast. Write it down and keep it in a log. 2) Take your medicines as prescribed 3) Eat low salt foods-Limit salt (sodium) to 2000 mg per day.  4) Stay as active as you can everyday 5) Limit all fluids for the day to less than 2 liters

## 2016-09-27 ENCOUNTER — Encounter (HOSPITAL_COMMUNITY): Payer: Medicare Other

## 2016-09-30 ENCOUNTER — Encounter (HOSPITAL_COMMUNITY): Payer: Medicare Other

## 2016-09-30 NOTE — Addendum Note (Signed)
Encounter addended by: Jacques EarthlyEdna Brewbaker Samiha Denapoli, RD on: 09/30/2016  9:19 AM<BR>    Actions taken: Flowsheet data copied forward, Visit Navigator Flowsheet section accepted

## 2016-10-02 ENCOUNTER — Encounter (HOSPITAL_COMMUNITY): Payer: Medicare Other

## 2016-10-07 ENCOUNTER — Encounter (HOSPITAL_COMMUNITY): Payer: Medicare Other

## 2016-10-08 ENCOUNTER — Encounter (HOSPITAL_COMMUNITY): Payer: Medicare Other

## 2016-10-08 MED FILL — GABAPENTIN 300 MG CAPSULE: 300 | 20 days supply | Qty: 60 | Fill #1

## 2016-10-09 ENCOUNTER — Encounter (HOSPITAL_COMMUNITY): Payer: Medicare Other

## 2016-10-09 MED FILL — TORSEMIDE 20 MG TABLET: 20 | 30 days supply | Qty: 60 | Fill #0

## 2016-10-11 ENCOUNTER — Encounter (HOSPITAL_COMMUNITY): Payer: Medicare Other

## 2016-10-14 ENCOUNTER — Telehealth (HOSPITAL_COMMUNITY): Payer: Self-pay | Admitting: *Deleted

## 2016-10-14 ENCOUNTER — Encounter (HOSPITAL_COMMUNITY): Payer: Medicare Other

## 2016-10-14 MED ORDER — TORSEMIDE 20 MG PO TABS: 60.0000 mg | ORAL_TABLET | Freq: Two times a day (BID) | ORAL | 6 refills | Status: DC

## 2016-10-14 NOTE — Telephone Encounter (Signed)
Pt called earlier to report her wt is up, on 11/16 it was 236 and her Torsemide was increased to 40 mg daily, since then she states her wt has continued to increase and yesterday she was 250 and today is at 254 lbs.  She states her abd is very swollen, some swelling to feet/ankles, sob w/exertion and has started coughing today.  She states she has been compliant with diet and has eaten salad w/homemade oil/vinegar dressing and oatmeal past 2 days.  Discussed FR with pt and she has been eating cups of ice, advised pt to cut back on this as it counts as her fluid intake for the day.    Discussed w/Dr Bensimhon, he states pt should increase Torsemide to 60 mg BID and keep appt as sch Wed.  Attempted to call pt back and Left message to call back

## 2016-10-14 NOTE — Telephone Encounter (Signed)
Pt aware and agreeable.  

## 2016-10-16 ENCOUNTER — Encounter (HOSPITAL_COMMUNITY): Payer: Self-pay | Admitting: General Practice

## 2016-10-16 ENCOUNTER — Ambulatory Visit (HOSPITAL_BASED_OUTPATIENT_CLINIC_OR_DEPARTMENT_OTHER)
Admission: RE | Admit: 2016-10-16 | Discharge: 2016-10-16 | Disposition: A | Discharge status: Home / Self Care | Payer: Medicare Other | Source: Ambulatory Visit | Attending: Cardiology | Admitting: Cardiology

## 2016-10-16 ENCOUNTER — Telehealth (HOSPITAL_COMMUNITY): Payer: Self-pay

## 2016-10-16 ENCOUNTER — Inpatient Hospital Stay (HOSPITAL_COMMUNITY)
Admission: AD | Admit: 2016-10-16 | Discharge: 2016-11-02 | DRG: 291 | Disposition: A | Discharge status: Home Health | Payer: Medicare Other | Source: Ambulatory Visit | Attending: Internal Medicine | Admitting: Internal Medicine

## 2016-10-16 ENCOUNTER — Encounter (HOSPITAL_COMMUNITY): Payer: Medicare Other

## 2016-10-16 VITALS — BP 136/70 | HR 94 | Resp 22 | Wt 262.1 lb

## 2016-10-16 DIAGNOSIS — Z951 Presence of aortocoronary bypass graft: Secondary | ICD-10-CM

## 2016-10-16 DIAGNOSIS — N179 Acute kidney failure, unspecified: Secondary | ICD-10-CM | POA: Diagnosis present

## 2016-10-16 DIAGNOSIS — E1165 Type 2 diabetes mellitus with hyperglycemia: Secondary | ICD-10-CM | POA: Diagnosis not present

## 2016-10-16 DIAGNOSIS — I252 Old myocardial infarction: Secondary | ICD-10-CM | POA: Diagnosis not present

## 2016-10-16 DIAGNOSIS — I5023 Acute on chronic systolic (congestive) heart failure: Secondary | ICD-10-CM | POA: Diagnosis present

## 2016-10-16 DIAGNOSIS — I130 Hypertensive heart and chronic kidney disease with heart failure and stage 1 through stage 4 chronic kidney disease, or unspecified chronic kidney disease: Secondary | ICD-10-CM | POA: Diagnosis not present

## 2016-10-16 DIAGNOSIS — N183 Chronic kidney disease, stage 3 unspecified: Secondary | ICD-10-CM

## 2016-10-16 DIAGNOSIS — I959 Hypotension, unspecified: Secondary | ICD-10-CM | POA: Diagnosis present

## 2016-10-16 DIAGNOSIS — I255 Ischemic cardiomyopathy: Secondary | ICD-10-CM | POA: Diagnosis present

## 2016-10-16 DIAGNOSIS — I340 Nonrheumatic mitral (valve) insufficiency: Secondary | ICD-10-CM | POA: Diagnosis present

## 2016-10-16 DIAGNOSIS — E1122 Type 2 diabetes mellitus with diabetic chronic kidney disease: Secondary | ICD-10-CM | POA: Diagnosis present

## 2016-10-16 DIAGNOSIS — Z6841 Body Mass Index (BMI) 40.0 and over, adult: Secondary | ICD-10-CM

## 2016-10-16 DIAGNOSIS — E875 Hyperkalemia: Secondary | ICD-10-CM | POA: Insufficient documentation

## 2016-10-16 DIAGNOSIS — I272 Pulmonary hypertension, unspecified: Secondary | ICD-10-CM | POA: Diagnosis present

## 2016-10-16 DIAGNOSIS — I5082 Biventricular heart failure: Secondary | ICD-10-CM | POA: Diagnosis present

## 2016-10-16 DIAGNOSIS — M1000 Idiopathic gout, unspecified site: Secondary | ICD-10-CM | POA: Diagnosis not present

## 2016-10-16 DIAGNOSIS — E876 Hypokalemia: Secondary | ICD-10-CM | POA: Diagnosis present

## 2016-10-16 DIAGNOSIS — R42 Dizziness and giddiness: Secondary | ICD-10-CM | POA: Diagnosis present

## 2016-10-16 DIAGNOSIS — R0600 Dyspnea, unspecified: Secondary | ICD-10-CM

## 2016-10-16 DIAGNOSIS — M109 Gout, unspecified: Secondary | ICD-10-CM | POA: Diagnosis present

## 2016-10-16 DIAGNOSIS — R51 Headache: Secondary | ICD-10-CM | POA: Diagnosis present

## 2016-10-16 DIAGNOSIS — I2721 Secondary pulmonary arterial hypertension: Secondary | ICD-10-CM | POA: Diagnosis not present

## 2016-10-16 DIAGNOSIS — E785 Hyperlipidemia, unspecified: Secondary | ICD-10-CM

## 2016-10-16 DIAGNOSIS — Z79899 Other long term (current) drug therapy: Secondary | ICD-10-CM | POA: Diagnosis not present

## 2016-10-16 DIAGNOSIS — I509 Heart failure, unspecified: Secondary | ICD-10-CM | POA: Diagnosis not present

## 2016-10-16 DIAGNOSIS — Z7982 Long term (current) use of aspirin: Secondary | ICD-10-CM

## 2016-10-16 DIAGNOSIS — I480 Paroxysmal atrial fibrillation: Secondary | ICD-10-CM | POA: Diagnosis not present

## 2016-10-16 DIAGNOSIS — N184 Chronic kidney disease, stage 4 (severe): Secondary | ICD-10-CM | POA: Diagnosis not present

## 2016-10-16 DIAGNOSIS — I2510 Atherosclerotic heart disease of native coronary artery without angina pectoris: Secondary | ICD-10-CM

## 2016-10-16 DIAGNOSIS — I5022 Chronic systolic (congestive) heart failure: Secondary | ICD-10-CM

## 2016-10-16 LAB — CREATININE, SERUM
CREATININE: 1.42 mg/dL — AB (ref 0.44–1.00)
GFR, EST AFRICAN AMERICAN: 44 mL/min — AB (ref >60)
GFR, EST NON AFRICAN AMERICAN: 38 mL/min — AB (ref >60)

## 2016-10-16 LAB — COMPREHENSIVE METABOLIC PANEL
ALBUMIN: 3.6 g/dL (ref 3.5–5.0)
ALK PHOS: 123 U/L (ref 38–126)
ALT: 16 U/L (ref 14–54)
AST: 46 U/L — AB (ref 15–41)
Anion gap: 11 (ref 5–15)
BUN: 38 mg/dL — ABNORMAL HIGH (ref 6–20)
CALCIUM: 9.1 mg/dL (ref 8.9–10.3)
CHLORIDE: 98 mmol/L — AB (ref 101–111)
CO2: 26 mmol/L (ref 22–32)
CREATININE: 1.48 mg/dL — AB (ref 0.44–1.00)
GFR calc Af Amer: 42 mL/min — ABNORMAL LOW (ref >60)
GFR calc non Af Amer: 36 mL/min — ABNORMAL LOW (ref >60)
GLUCOSE: 322 mg/dL — AB (ref 65–99)
Potassium: 4.5 mmol/L (ref 3.5–5.1)
SODIUM: 135 mmol/L (ref 135–145)
Total Bilirubin: 1.6 mg/dL — ABNORMAL HIGH (ref 0.3–1.2)
Total Protein: 7.7 g/dL (ref 6.5–8.1)

## 2016-10-16 LAB — CBC
HCT: 39.6 % (ref 36.0–46.0)
HCT: 40.3 % (ref 36.0–46.0)
HEMOGLOBIN: 12.9 g/dL (ref 12.0–15.0)
Hemoglobin: 12.8 g/dL (ref 12.0–15.0)
MCH: 30.1 pg (ref 26.0–34.0)
MCH: 29.7 pg (ref 26.0–34.0)
MCHC: 32.3 g/dL (ref 30.0–36.0)
MCHC: 32 g/dL (ref 30.0–36.0)
MCV: 93.2 fL (ref 78.0–100.0)
MCV: 92.9 fL (ref 78.0–100.0)
PLATELETS: 310 10*3/uL (ref 150–400)
PLATELETS: 329 10*3/uL (ref 150–400)
RBC: 4.25 MIL/uL (ref 3.87–5.11)
RBC: 4.34 MIL/uL (ref 3.87–5.11)
RDW: 15.6 % — ABNORMAL HIGH (ref 11.5–15.5)
RDW: 15.5 % (ref 11.5–15.5)
WBC: 5.6 10*3/uL (ref 4.0–10.5)
WBC: 5.1 10*3/uL (ref 4.0–10.5)

## 2016-10-16 LAB — TSH: TSH: 5.699 u[IU]/mL — ABNORMAL HIGH (ref 0.350–4.500)

## 2016-10-16 LAB — BRAIN NATRIURETIC PEPTIDE: B NATRIURETIC PEPTIDE 5: 903.7 pg/mL — AB (ref 0.0–100.0)

## 2016-10-16 LAB — MAGNESIUM: MAGNESIUM: 2.1 mg/dL (ref 1.7–2.4)

## 2016-10-16 LAB — T4, FREE: Free T4: 1.18 ng/dL — ABNORMAL HIGH (ref 0.61–1.12)

## 2016-10-16 LAB — TROPONIN I: TROPONIN I: 0.05 ng/mL — AB (ref <0.03)

## 2016-10-16 LAB — MRSA PCR SCREENING: MRSA BY PCR: NEGATIVE

## 2016-10-16 MED ORDER — SODIUM CHLORIDE 0.9 % IV SOLN
250.0000 mL | INTRAVENOUS | Status: DC | PRN
  Administered 2016-10-24: 03:00:00 via INTRAVENOUS

## 2016-10-16 MED ORDER — SODIUM CHLORIDE 0.9% FLUSH
3.0000 mL | Freq: Two times a day (BID) | INTRAVENOUS | Status: DC
  Administered 2016-10-17 – 2016-10-29 (×9): 3 mL via INTRAVENOUS

## 2016-10-16 MED ORDER — GABAPENTIN 300 MG PO CAPS
300.0000 mg | ORAL_CAPSULE | Freq: Three times a day (TID) | ORAL | Status: DC
  Administered 2016-10-16 – 2016-11-02 (×51): 300 mg via ORAL
  Filled 2016-10-16 (×51): qty 1

## 2016-10-16 MED ORDER — ONDANSETRON HCL 4 MG/2ML IJ SOLN: 4.0000 mg | Freq: Four times a day (QID) | INTRAMUSCULAR | Status: DC | PRN

## 2016-10-16 MED ORDER — ASPIRIN EC 81 MG PO TBEC
81.0000 mg | DELAYED_RELEASE_TABLET | Freq: Every day | ORAL | Status: DC
  Administered 2016-10-17 – 2016-11-02 (×17): 81 mg via ORAL
  Filled 2016-10-16 (×18): qty 1

## 2016-10-16 MED ORDER — SODIUM CHLORIDE 0.9% FLUSH: 3.0000 mL | INTRAVENOUS | Status: DC | PRN

## 2016-10-16 MED ORDER — ENOXAPARIN SODIUM 40 MG/0.4ML ~~LOC~~ SOLN
40.0000 mg | SUBCUTANEOUS | Status: DC
  Filled 2016-10-16 (×2): qty 0.4

## 2016-10-16 MED ORDER — ACETAMINOPHEN 325 MG PO TABS
650.0000 mg | ORAL_TABLET | ORAL | Status: DC | PRN
  Administered 2016-10-17 – 2016-11-02 (×5): 650 mg via ORAL
  Filled 2016-10-16 (×5): qty 2

## 2016-10-16 MED ORDER — FUROSEMIDE 10 MG/ML IJ SOLN
80.0000 mg | Freq: Two times a day (BID) | INTRAMUSCULAR | Status: DC
  Administered 2016-10-16 – 2016-10-17 (×2): 80 mg via INTRAVENOUS
  Filled 2016-10-16 (×2): qty 8

## 2016-10-16 MED ORDER — MILRINONE LACTATE IN DEXTROSE 20-5 MG/100ML-% IV SOLN
0.2500 ug/kg/min | INTRAVENOUS | Status: DC
  Administered 2016-10-16 – 2016-10-21 (×10): 0.25 ug/kg/min via INTRAVENOUS
  Filled 2016-10-16 (×13): qty 100

## 2016-10-16 NOTE — Telephone Encounter (Signed)
[facility] 0.05. [name] NP-C made aware. No new orders at this time, patient has been transferred from CHF clinic to 3w36.  Ave FilterBradley, Nikolette Reindl Genevea, RN

## 2016-10-16 NOTE — Addendum Note (Signed)
Encounter addended by: Laurey Moralealton S Ashkan Chamberland, MD on: 10/16/2016  3:29 PM<BR>    Actions taken: Sign clinical note, Charge Capture section accepted

## 2016-10-16 NOTE — Addendum Note (Signed)
Encounter addended by: Sherald HessAmy D Logen Fowle, NP on: 10/16/2016  3:21 PM<BR>    Actions taken: LOS modified

## 2016-10-16 NOTE — Progress Notes (Addendum)
Patient ID: Ravleen Ries, female   DOB: [birth year], 65 y.o.   MRN: [id number]     Advanced Heart Failure Clinic Note   PCP: Arnette Felts  HF: Dr. Gala Romney   HPI: Ms.Sattler is a 65 y.o. female w/ PMHx significant for morbid obesity, systolic HF due to ICM (10/12 EF 25%; 07/2012 EF 35%; 03/18/13 EF 20-25%), CAD with h/o NSTEMI s/p CABG Oct 2012, pulmonary hypertension (PA peak ), and poorly controlled DM 2.  She was admitted to [facility] in 2014 for dyspnea, leg edema, and weight gain.  Lasix gtt used and she diuresed 15 liters. Diuresed 29 pounds.  ECHO- EF 20-25%.  Discharge weight 264 pounds. Cleda Daub stopped d/t dizziness and hyperkalemia   Admitted 12/08/2015 - 12/28/2015 with worsening DOE and cough. Stated she had a 40 lb weight since 07/2015. She was diuresed aggressively with lasix gtt at 30 mg/hr and metolazone 5 mg BID. Diamox eventually added. Diuretics held temporarily towards end of stay with worsening Creatinine. Overall she diuresed >40 L and was down 70 lbs from admission weight. Pt had R/LHC with preserved CO and mild/mod Pulm HTN. Severe native CAD with patent CABG grafts. Discharge weight 237 lbs.   Admitted 6/1 -04/26/2016 with marked volume overload, low output hf, cardiorenal syndrome. Diuresed with high dose lasix 160 mg three times a day. Diuresed over 50 pounds. Transitioned to torsemide 80 mg daily. BB and losartan stopped. Discharge weight 222 pounds   Admitted 10/15 - 09/08/16 with marked volume overload. PT diuresed with high dose IV lasix, metolazone, as well as Milrinone to augment diuresis. Diamox also added.  Pt diuresed 35 L and lost 44 lbs. Pt transitioned back to po torsemide, and had to be adjusted further with AKI.  Discharge weight 224 lbs.   She presents for regular follow up.  She called the HF clinic on Monday with weight gain and she was instructed to increase torsemide to 60 mg twice a day. Says she had been eating pickles. She says her weight has been going up  on a daily basis. Weight at home up to 258 pounds. SOB at rest. +orthopnea. Denies PND. Says she has been taking all medications.    Labs 2/17 K 4.3, Cr 1.56 Labs 04/26/2016: K 3.1 Creatinine 1.78 K 3.1 Labs 06/05/2016: K 4.3 Creatinine 1.25 Labas 06/13/2016: K 4.0 Creatinine 1.49  Labs 07/31/2016: K 4.5 Creatinine 2.27  Labs  08/05/2016: K 5.0 Creatinine 3.18   03/18/13 ECHO EF 20-25% 06/17/2013 ECHO EF 35% RV ok (read formally after visit 35-40%) 03/15/2015 ECHO EF 30-35%, grade 3 DD, Moderate TR, Severely reduced RV, PA peak pressure 81 mm Hg 12/09/2015 ECHO EF 15-20%, Decreased LV diastolic compliance, RV mod reduced, PA peak 57 mmHg, Severe TR  04/14/16 Echo 15-20%   R/LHC 12/13/15 Hemodynamics RA 25/25 (23) RV pressure 92/13  RV EDP 26 PA pressure 86/34 (56) PW mean 37 AO Pressure 114/72 (88) LV Pressure 101/12 (26) LV EDP 26 PVR 3.24 Fick CO/CI 5.85 / 2.41L/min   Prox LAD lesion, 100% stenosed. Prox Cx lesion, 99% stenosed. Mid RCA lesion, 80% stenosed. Dist RCA lesion, 100% stenosed. Patent grafts  ROS: All systems negative except as listed in HPI, PMH and Problem List.  Past Medical History:  Diagnosis Date  . CAD (coronary artery disease) 08/2011   NSTEMI with subsequent CABG 08/13/2011 with LIMA-LAD, SVG-diagonal, SVG-OM, SVG-PDA  . Carpal tunnel syndrome on both sides    "post OHS in 08/2011; resolved now" (03/17/2013)  . CHF (congestive  heart failure) ([place])   . Chronic systolic heart failure (HCC)    Chronic systolic CHF  . Diabetes mellitus type 2 in obese ([place]) 08/10/11  . Dyspnea   . Ejection fraction < 50%    EF 20%, October, 2012  //  EF 25% December, 2012  //   EF 35%, echo, September, 2013  . Hyperlipidemia 08/10/11  . Hypertension   . Ischemic cardiomyopathy 08/13/2011   EF 20% in 08/2011, still 25% 10/21/2011  . Mitral regurgitation    Mild by echo 10/21/11  . Myocardial infarction 08/2011  . Obesity, morbid ([place])   . Orthopnoea    "progressively worse over  last 3 wks" (03/17/2013)  . Pleural effusion    Requiring L thoracentesis 09/02/11  . Pulmonary hypertension    Echo, September, 2013, 72 mmHg.    Current Outpatient Prescriptions  Medication Sig Dispense Refill  . aspirin 81 MG tablet Take 1 tablet (81 mg total) by mouth daily. 30 tablet 9  . Blood Glucose Monitoring Suppl (ONETOUCH VERIO) W/DEVICE KIT 1 Act by Does not apply route 3 (three) times daily. 2 kit 0  . gabapentin (NEURONTIN) 300 MG capsule Take 300 mg by mouth 3 (three) times daily.     Marland Kitchen glucose blood test strip Use TID 100 each 12  . Multiple Vitamins-Minerals (CENTRUM SILVER 50+WOMEN PO) Take 1 tablet by mouth every morning.    . torsemide (DEMADEX) 20 MG tablet Take 3 tablets (60 mg total) by mouth 2 (two) times daily. 30 tablet 6   No current facility-administered medications for this encounter.      PHYSICAL EXAM: Vitals:   10/16/16 1408  BP: 136/70  BP Location: Right Arm  Patient Position: Sitting  Cuff Size: Large  Pulse: 94  Resp: (!) 22  SpO2: 97%  Weight: 262 lb 2 oz (118.9 kg)   Wt Readings from Last 3 Encounters:  10/16/16 262 lb 2 oz (118.9 kg)  09/26/16 241 lb 6.4 oz (109.5 kg)  09/12/16 227 lb 12.8 oz (103.3 kg)     General:Dyspneic at rest. Ambulated slowly in the clinic.   HEENT: Normal Neck: JVP to ear. Carotids 2+ bilat; no bruits. No thyromegaly or nodule noted.  Cor: PMI nondisplaced. Distant, Irregular  No M/G/R Lungs: Mildly diminished basilar sounds.  Abdomen: Morbidly obese, soft, NT, ND, no HSM. No bruits or masses. +BS  Extremities: no cyanosis, clubbing, rash. R and LLE 3 +edema extends to thighs.   Neuro: alert & orientedx3, cranial nerves grossly intact. moves all 4 extremities w/o difficulty. Affect pleasant  EKG: Wandering Atrial Pace Makler, PACs and intermittent 83 bpm low voltage QRS  ASSESSMENT/PLAN 1. Acute/Chronic systolic HF due to iCM EF 15-20% Echo 04/14/2016. - ECHO 07/2016 at the [facility]- EF  30-35% RV normal.  - NYHA IV. Marked volume overload in the setting of high salt intake. Admit for IV diuresis and milrinone.  Start 80 mg IV lasix twice a day.  I- No BB with recurrent low output.  - Currently off ARNI and spiro due to recent AKI and hyperkalemia.Marland Kitchen  - Intolerant bidil with difficulty with headaches, dizziness and hypotension.  Low voltage QRS- check SPEP UPEP. Repeat ECHO.  2. CAD s/p NSTEMI and CABG 10/12  - Severe native CAD with patent grafts on LHC 12/13/15. No CP - Continue ASA 81 mg. Re start atorvastatin  40 mg daily.  3. Morbid obesity 4. Pulmonary HTN - mild to mod RHC 12/13/15. PVR 3.24.  5.  CKD stage III 6. DM2 -place on sliding scale  7. Day time fatigue   Admit to telemetry. Place PICC for milrinone/IV lasix. May need stepdown. Check CBC, CMET, SPEP, BNP     [name], NP-C 1:48 PM   Patient seen with NP, agree with the above note.  She came in today markedly volume overloaded.  Only taking ASA and torsemide at home, says that she is taking her diuretic but not urinating much.    On exam, she is markedly volume overloaded.  She has required milrinone for effective diuresis during prior admissions.  I am concerned for low output HF with cardiorenal syndrome.  Baseline creatinine is about 1.5, no labs back yet today.   We will admit her and start milrinone 0.25 empirically.  Will place PICC and follow CVP/co-ox.  Will repeat echo given low voltage, rule out pericardial effusion.  Lasix 80 mg IV bid.    Continue ASA, restart statin, atorvastatin 40 daily.   Loralie Champagne 10/16/2016 3:28 PM

## 2016-10-16 NOTE — H&P (Signed)
Patient ID: Christine Knight, female   DOB: 03-Dec-1950, 65 y.o.   MRN: [id number]     Advanced Heart Failure H&P   PCP: Roque Cash  HF: Dr. Haroldine Laws   HPI: Christine Knight is a 65 y.o. female w/ PMHx significant for morbid obesity, systolic HF due to ICM (16/94 EF 25%; 07/2012 EF 35%; 03/18/13 EF 20-25%), CAD with h/o NSTEMI s/p CABG Oct 2012, pulmonary hypertension (PA peak 65mHg), and poorly controlled DM 2.  [facility], and weight gain.  Lasix gtt used and she diuresed 15 liters. Diuresed 29 pounds.  ECHO- EF 20-25%.  Discharge weight 264 pounds. Christine Harmanstopped d/t dizziness and hyperkalemia   Admitted 12/08/2015 - 12/28/2015 with worsening DOE and cough. Stated she had a 40 lb weight since 07/2015. She was diuresed aggressively with lasix gtt at 30 mg/hr and metolazone 5 mg BID. Diamox eventually added. Diuretics held temporarily towards end of stay with worsening Creatinine. Overall she diuresed >40 L and was down 70 lbs from admission weight. Pt had R/LHC with preserved CO and mild/mod Pulm HTN. Severe native CAD with patent CABG grafts. Discharge weight 237 lbs.   Admitted 6/1 -04/26/2016 with marked volume overload, low output hf, cardiorenal syndrome. Diuresed with high dose lasix 160 mg three times a day. Diuresed over 50 pounds. Transitioned to torsemide 80 mg daily. BB and losartan stopped. Discharge weight 222 pounds   Admitted 10/15 - 09/08/16 with marked volume overload. PT diuresed with high dose IV lasix, metolazone, as well as Milrinone to augment diuresis. Diamox also added.  Pt diuresed 35 L and lost 44 lbs. Pt transitioned back to po torsemide, and had to be adjusted further with AKI.  Discharge weight 224 lbs.   She presents for regular follow up.  She called the HF clinic on Monday with weight gain and she was instructed to increase torsemide to 60 mg twice a day. Says she had been eating pickles. She says her weight has been going  up on a daily basis. Weight at home up to 258 pounds. SOB at rest. +orthopnea. Denies PND. Says she has been taking all medications.    Labs 2/17 K 4.3, Cr 1.56 Labs 04/26/2016: K 3.1 Creatinine 1.78 K 3.1 Labs 06/05/2016: K 4.3 Creatinine 1.25 Labas 06/13/2016: K 4.0 Creatinine 1.49  Labs 07/31/2016: K 4.5 Creatinine 2.27  Labs  08/05/2016: K 5.0 Creatinine 3.18   03/18/13 ECHO EF 20-25% 06/17/2013 ECHO EF 35% RV ok (read formally after visit 35-40%) 03/15/2015 ECHO EF 30-35%, grade 3 DD, Moderate TR, Severely reduced RV, PA peak pressure 81 mm Hg 12/09/2015 ECHO EF 15-20%, Decreased LV diastolic compliance, RV mod reduced, PA peak 57 mmHg, Severe TR  04/14/16 Echo 15-20%   R/LHC 12/13/15 Hemodynamics RA 25/25 (23) RV pressure 92/13  RV EDP 26 PA pressure 86/34 (56) PW mean 37 AO Pressure 114/72 (88) LV Pressure 101/12 (26) LV EDP 26 PVR 3.24 Fick CO/CI 5.85 / 2.41L/min   Prox LAD lesion, 100% stenosed. Prox Cx lesion, 99% stenosed. Mid RCA lesion, 80% stenosed. Dist RCA lesion, 100% stenosed. Patent grafts  FH: No FH of CAD SH.: Lives with her daugther. She does not smoker or drink alcohol   ROS: All systems negative except as listed in HPI, PMH and Problem List.      Past Medical History:  Diagnosis Date  . CAD (coronary artery disease) 08/2011   NSTEMI with subsequent CABG 08/13/2011 with LIMA-LAD, SVG-diagonal, SVG-OM, SVG-PDA  .  Carpal tunnel syndrome on both sides    "post OHS in 08/2011; resolved now" (03/17/2013)  . CHF (congestive heart failure) ([place])   . Chronic systolic heart failure (HCC)    Chronic systolic CHF  . Diabetes mellitus type 2 in obese ([place]) 08/10/11  . Dyspnea   . Ejection fraction < 50%    EF 20%, October, 2012  //  EF 25% December, 2012  //   EF 35%, echo, September, 2013  . Hyperlipidemia 08/10/11  . Hypertension   . Ischemic cardiomyopathy 08/13/2011   EF 20% in 08/2011, still 25% 10/21/2011  . Mitral regurgitation    Mild by echo  10/21/11  . Myocardial infarction 08/2011  . Obesity, morbid ([place])   . Orthopnoea    "progressively worse over last 3 wks" (03/17/2013)  . Pleural effusion    Requiring L thoracentesis 09/02/11  . Pulmonary hypertension    Echo, September, 2013, 72 mmHg.          Current Outpatient Prescriptions  Medication Sig Dispense Refill  . aspirin 81 MG tablet Take 1 tablet (81 mg total) by mouth daily. 30 tablet 9  . Blood Glucose Monitoring Suppl (ONETOUCH VERIO) W/DEVICE KIT 1 Act by Does not apply route 3 (three) times daily. 2 kit 0  . gabapentin (NEURONTIN) 300 MG capsule Take 300 mg by mouth 3 (three) times daily.     Marland Kitchen glucose blood test strip Use TID 100 each 12  . Multiple Vitamins-Minerals (CENTRUM SILVER 50+WOMEN PO) Take 1 tablet by mouth every morning.    . torsemide (DEMADEX) 20 MG tablet Take 3 tablets (60 mg total) by mouth 2 (two) times daily. 30 tablet 6   No current facility-administered medications for this encounter.      PHYSICAL EXAM:    Vitals:   10/16/16 1408  BP: 136/70  BP Location: Right Arm  Patient Position: Sitting  Cuff Size: Large  Pulse: 94  Resp: (!) 22  SpO2: 97%  Weight: 262 lb 2 oz (118.9 kg)      Wt Readings from Last 3 Encounters:  10/16/16 262 lb 2 oz (118.9 kg)  09/26/16 241 lb 6.4 oz (109.5 kg)  09/12/16 227 lb 12.8 oz (103.3 kg)     General:Dyspneic at rest. Ambulated slowly in the clinic.   HEENT: Normal Neck: JVP to ear. Carotids 2+ bilat; no bruits. No thyromegaly or nodule noted.  Cor: PMI nondisplaced. Distant, Irregular  No M/G/R Lungs: Mildly diminished basilar sounds.  Abdomen: Morbidly obese, soft, NT, ND, no HSM. No bruits or masses. +BS  Extremities: no cyanosis, clubbing, rash. R and LLE 3 +edema extends to thighs.   Neuro: alert & orientedx3, cranial nerves grossly intact. moves all 4 extremities w/o difficulty. Affect pleasant  EKG: Wandering Atrial Pace Makler, PACs and intermittent 83 bpm low  voltage QRS  ASSESSMENT/PLAN 1. Acute/Chronic systolic HF due to iCM EF 15-20% Echo 04/14/2016. - ECHO 07/2016 at [facility]- EF 30-35% RV normal.  - NYHA IV. Marked volume overload in the setting of high salt intake. Admit for IV diuresis and milrinone.  Start 80 mg IV lasix twice a day.  I- No BB with recurrent low output.  - Currently off ARNI and spiro due to recent AKI and hyperkalemia.Marland Kitchen  - Intolerant bidil with difficulty with headaches, dizziness and hypotension.  Low voltage QRS- check SPEP UPEP. Repeat ECHO.  2. CAD s/p NSTEMI and CABG 10/12  - Severe native CAD with patent grafts on LHC  12/13/15. No CP - Continue ASA 81 mg. Re start atorvastatin  40 mg daily.  3. Morbid obesity 4. Pulmonary HTN - mild to mod RHC 12/13/15. PVR 3.24.  5. CKD stage III 6. DM2 -place on sliding scale  7. Day time fatigue   Admit to telemetry. Place PICC for milrinone/IV lasix. May need stepdown. Check CBC, CMET, SPEP, BNP     Asha Grumbine, NP-C 1:48 PM   Patient seen with NP, agree with the above note.  She came in today markedly volume overloaded.  Only taking ASA and torsemide at home, says that she is taking her diuretic but not urinating much.    On exam, she is markedly volume overloaded.  She has required milrinone for effective diuresis during prior admissions.  I am concerned for low output HF with cardiorenal syndrome.  Baseline creatinine is about 1.5, no labs back yet today.   We will admit her and start milrinone 0.25 empirically.  Will place PICC and follow CVP/co-ox.  Will repeat echo given low voltage, rule out pericardial effusion.  Lasix 80 mg IV bid.    Continue ASA, restart statin, atorvastatin 40 daily.   Loralie Champagne 10/16/2016 3:28 PM

## 2016-10-17 ENCOUNTER — Inpatient Hospital Stay (HOSPITAL_COMMUNITY): Payer: Medicare Other

## 2016-10-17 DIAGNOSIS — N184 Chronic kidney disease, stage 4 (severe): Secondary | ICD-10-CM

## 2016-10-17 DIAGNOSIS — I509 Heart failure, unspecified: Secondary | ICD-10-CM

## 2016-10-17 DIAGNOSIS — I5023 Acute on chronic systolic (congestive) heart failure: Secondary | ICD-10-CM

## 2016-10-17 LAB — ECHOCARDIOGRAM COMPLETE
HEIGHTINCHES: 66 in
WEIGHTICAEL: 4153.6 [oz_av]

## 2016-10-17 LAB — GLUCOSE, CAPILLARY
Glucose-Capillary: 346 mg/dL — ABNORMAL HIGH (ref 65–99)
Glucose-Capillary: 259 mg/dL — ABNORMAL HIGH (ref 65–99)
Glucose-Capillary: 235 mg/dL — ABNORMAL HIGH (ref 65–99)

## 2016-10-17 LAB — BASIC METABOLIC PANEL
Anion gap: 12 (ref 5–15)
BUN: 34 mg/dL — AB (ref 6–20)
CHLORIDE: 98 mmol/L — AB (ref 101–111)
CO2: 26 mmol/L (ref 22–32)
CREATININE: 1.33 mg/dL — AB (ref 0.44–1.00)
Calcium: 9.1 mg/dL (ref 8.9–10.3)
GFR calc Af Amer: 47 mL/min — ABNORMAL LOW (ref >60)
GFR calc non Af Amer: 41 mL/min — ABNORMAL LOW (ref >60)
GLUCOSE: 299 mg/dL — AB (ref 65–99)
POTASSIUM: 3.7 mmol/L (ref 3.5–5.1)
SODIUM: 136 mmol/L (ref 135–145)

## 2016-10-17 LAB — T3, FREE: T3 FREE: 2.8 pg/mL (ref 2.0–4.4)

## 2016-10-17 MED ORDER — FUROSEMIDE 10 MG/ML IJ SOLN
30.0000 mg/h | INTRAVENOUS | Status: DC
  Administered 2016-10-17 – 2016-10-18 (×2): 20 mg/h via INTRAVENOUS
  Administered 2016-10-20 – 2016-10-30 (×19): 30 mg/h via INTRAVENOUS
  Filled 2016-10-17 (×62): qty 25

## 2016-10-17 MED ORDER — INSULIN ASPART 100 UNIT/ML ~~LOC~~ SOLN
0.0000 [IU] | Freq: Three times a day (TID) | SUBCUTANEOUS | Status: DC
  Administered 2016-10-17: 11 [IU] via SUBCUTANEOUS
  Administered 2016-10-17: 8 [IU] via SUBCUTANEOUS
  Administered 2016-10-18: 5 [IU] via SUBCUTANEOUS
  Administered 2016-10-18 (×2): 8 [IU] via SUBCUTANEOUS
  Administered 2016-10-22: 11 [IU] via SUBCUTANEOUS
  Administered 2016-10-23 – 2016-10-24 (×3): 3 [IU] via SUBCUTANEOUS
  Administered 2016-10-24: 2 [IU] via SUBCUTANEOUS
  Administered 2016-10-24: 3 [IU] via SUBCUTANEOUS
  Administered 2016-10-25: 5 [IU] via SUBCUTANEOUS
  Administered 2016-10-25: 3 [IU] via SUBCUTANEOUS
  Administered 2016-10-25: 2 [IU] via SUBCUTANEOUS
  Administered 2016-10-26: 3 [IU] via SUBCUTANEOUS
  Administered 2016-10-26: 5 [IU] via SUBCUTANEOUS
  Administered 2016-10-26 – 2016-10-27 (×3): 3 [IU] via SUBCUTANEOUS
  Administered 2016-10-27: 5 [IU] via SUBCUTANEOUS
  Administered 2016-10-28: 3 [IU] via SUBCUTANEOUS
  Administered 2016-10-28: 5 [IU] via SUBCUTANEOUS
  Administered 2016-10-28: 2 [IU] via SUBCUTANEOUS
  Administered 2016-10-29: 5 [IU] via SUBCUTANEOUS
  Administered 2016-10-29: 8 [IU] via SUBCUTANEOUS
  Administered 2016-10-29: 5 [IU] via SUBCUTANEOUS
  Administered 2016-10-30: 8 [IU] via SUBCUTANEOUS
  Administered 2016-10-30: 15 [IU] via SUBCUTANEOUS
  Administered 2016-10-30: 11 [IU] via SUBCUTANEOUS
  Administered 2016-10-31: 8 [IU] via SUBCUTANEOUS
  Administered 2016-10-31 – 2016-11-01 (×2): 3 [IU] via SUBCUTANEOUS
  Administered 2016-11-01: 11 [IU] via SUBCUTANEOUS
  Administered 2016-11-02: 5 [IU] via SUBCUTANEOUS

## 2016-10-17 MED ORDER — POTASSIUM CHLORIDE CRYS ER 20 MEQ PO TBCR
20.0000 meq | EXTENDED_RELEASE_TABLET | Freq: Two times a day (BID) | ORAL | Status: DC
  Administered 2016-10-17 – 2016-10-18 (×3): 20 meq via ORAL
  Filled 2016-10-17 (×3): qty 1

## 2016-10-17 NOTE — Progress Notes (Signed)
Peripherally Inserted Central Catheter/Midline Placement  The IV Nurse has discussed with the patient and/or persons authorized to consent for the patient, the purpose of this procedure and the potential benefits and risks involved with this procedure.  The benefits include less needle sticks, lab draws from the catheter, and the patient may be discharged home with the catheter. Risks include, but not limited to, infection, bleeding, blood clot (thrombus formation), and puncture of an artery; nerve damage and irregular heartbeat and possibility to perform a PICC exchange if needed/ordered by physician.  Alternatives to this procedure were also discussed.  Bard Power PICC patient education guide, fact sheet on infection prevention and patient information card has been provided to patient /or left at bedside.    PICC/Midline Placement Documentation  PICC Double Lumen 10/17/16 PICC Left Brachial 40 cm 0 cm (Active)  Indication for Insertion or Continuance of Line Vasoactive infusions;Prolonged intravenous therapies 10/17/2016  9:01 AM  Exposed Catheter (cm) 0 cm 10/17/2016  9:01 AM  Site Assessment Clean;Dry;Intact 10/17/2016  9:01 AM  Lumen #1 Status Flushed;Saline locked;Blood return noted 10/17/2016  9:01 AM  Lumen #2 Status Flushed;Saline locked;Blood return noted 10/17/2016  9:01 AM  Dressing Type Transparent 10/17/2016  9:01 AM  Dressing Status Clean;Dry;Intact;Antimicrobial disc in place 10/17/2016  9:01 AM  Line Care Connections checked and tightened 10/17/2016  9:01 AM  Line Adjustment (NICU/IV Team Only) No 10/17/2016  9:01 AM  Dressing Intervention New dressing 10/17/2016  9:01 AM  Dressing Change Due 10/24/16 10/17/2016  9:01 AM       Elliot Dallyiggs, Balian Schaller Wright 10/17/2016, 9:02 AM

## 2016-10-17 NOTE — Progress Notes (Signed)
Advanced Heart Failure Rounding Note  PCP: Arnette FeltsMike Duran  HF: Dr. Gala RomneyBensimhon   Subjective:    Admitted from clinic 10/16/16 with marked volume overload in setting of dietary indiscretion despite increase in diuretics.   Remains dyspneic walking around room. Not peeing very much.   Poor diuresis noted on Lasix 80 mg IV BID and milrinone 0.25 mcg/kg/min.  Objective:   Weight Range: 259 lb 9.6 oz (117.8 kg) Body mass index is 41.9 kg/m.   Vital Signs:   Temp:  [97.6 F (36.4 C)-98.3 F (36.8 C)] 97.7 F (36.5 C) (12/07 0449) Pulse Rate:  [70-91] 91 (12/07 0449) Resp:  [11-22] 11 (12/07 0449) BP: (122-131)/(59-72) 122/67 (12/07 0449) SpO2:  [94 %-99 %] 94 % (12/07 0449) Weight:  [259 lb 9.6 oz (117.8 kg)-262 lb 3.2 oz (118.9 kg)] 259 lb 9.6 oz (117.8 kg) (12/07 0449)    Weight change: Filed Weights   10/16/16 1552 10/17/16 0449  Weight: 262 lb 3.2 oz (118.9 kg) 259 lb 9.6 oz (117.8 kg)    Intake/Output:   Intake/Output Summary (Last 24 hours) at 10/17/16 1149 Last data filed at 10/17/16 0900  Gross per 24 hour  Intake           535.92 ml  Output              550 ml  Net           -14.08 ml     Physical Exam: General:Dyspneic at rest. Ambulated slowly in the clinic.  HEENT: Normal Neck: JVP to ear. Carotids 2+ bilat; no bruits. No thyromegaly or nodule noted.  Cor: PMI nondisplaced. Distant, Irregular No M/G/R Lungs: Mildly diminished basilar sounds.  Abdomen: Morbidly obese, soft, NT, ND, no HSM. No bruits or masses. +BS  Extremities: no cyanosis, clubbing, rash. R and LLE 3 +edema extends to thighs.  Neuro: alert & orientedx3, cranial nerves grossly intact. moves all 4 extremities w/o difficulty. Affect pleasant  EKG: Wandering Atrial Pace Makler, PACs and intermittent 83 bpm low voltage QRS  Labs: CBC  Recent Labs  10/16/16 1511 10/16/16 1604  WBC 5.6 5.1  HGB 12.8 12.9  HCT 39.6 40.3  MCV 93.2 92.9  PLT 310 329   Basic Metabolic  Panel  Recent Labs  16/08/9611/06/17 1511 10/16/16 1604 10/17/16 0543  NA 135  --  136  K 4.5  --  3.7  CL 98*  --  98*  CO2 26  --  26  GLUCOSE 322*  --  299*  BUN 38*  --  34*  CREATININE 1.48* 1.42* 1.33*  CALCIUM 9.1  --  9.1  MG 2.1  --   --    Liver Function Tests  Recent Labs  10/16/16 1511  AST 46*  ALT 16  ALKPHOS 123  BILITOT 1.6*  PROT 7.7  ALBUMIN 3.6   No results for input(s): LIPASE, AMYLASE in the last 72 hours. Cardiac Enzymes  Recent Labs  10/16/16 1514  TROPONINI 0.05*    BNP: BNP (last 3 results)  Recent Labs  08/25/16 2119 09/26/16 1442 10/16/16 1512  BNP 1,359.9* 1,058.8* 903.7*    ProBNP (last 3 results) No results for input(s): PROBNP in the last 8760 hours.   D-Dimer No results for input(s): DDIMER in the last 72 hours. Hemoglobin A1C No results for input(s): HGBA1C in the last 72 hours. Fasting Lipid Panel No results for input(s): CHOL, HDL, LDLCALC, TRIG, CHOLHDL, LDLDIRECT in the last 72 hours. Thyroid Function Tests  Recent Labs  10/16/16 1512  TSH 5.699*  T3FREE 2.8    Other results:     Imaging/Studies:  Dg Chest Port 1 View  Result Date: 10/17/2016 CLINICAL DATA:  Status post PICC line placement. EXAM: PORTABLE CHEST 1 VIEW COMPARISON:  Portable chest x-ray of October 17, 2016 FINDINGS: Patient has undergone PICC line placement via the left upper extremity. The tip of the catheter overlies the midportion of the SVC. There is no postprocedure pneumothorax. The heart remains enlarged. The pulmonary vascularity is mildly engorged. There is small left pleural effusion. There are post CABG changes. There is calcification in the wall of the aortic arch. IMPRESSION: No postprocedure complication following left-sided PICC line placement. The tip of the catheter overlies the midportion of the SVC. Electronically Signed   By: David  SwazilandJordan M.D.   On: 10/17/2016 09:35   Dg Chest Port 1 View  Result Date: 10/17/2016 CLINICAL  DATA:  Shortness of Breath EXAM: PORTABLE CHEST 1 VIEW COMPARISON:  08/25/2016 FINDINGS: Cardiomegaly with vascular congestion. Right CA P he. Patchy in airspace at a shin prominent on like legs edema similar prior study. Suspect small. IMPRESSION: Cardiomegaly with vascular congestion probable mild edema. Small effusion. No real change. Electronically Signed   By: Charlett NoseKevin  Dover M.D.   On: 10/17/2016 07:56     Latest Echo  Latest Cath   Medications:     Scheduled Medications: . aspirin EC  81 mg Oral Daily  . enoxaparin (LOVENOX) injection  40 mg Subcutaneous Q24H  . gabapentin  300 mg Oral TID  . insulin aspart  0-15 Units Subcutaneous TID WC  . potassium chloride  20 mEq Oral BID  . sodium chloride flush  3 mL Intravenous Q12H     Infusions: . furosemide (LASIX) infusion    . milrinone 0.25 mcg/kg/min (10/16/16 1743)     PRN Medications:  sodium chloride, acetaminophen, ondansetron (ZOFRAN) IV, sodium chloride flush   Assessment/Plan   1. Acute/Chronic systolic HF due to iCM EF 15-20% Echo 04/14/2016. - [facility] EF 30-35% RV normal.  - NYHA IV. Marked volume overload in the setting of high salt intake.  - remains on milrinone with poor response to IV diuretics - Stop lasix bolus. Switch to lasix gtt at 20 mg/hr. CVP 24 - Likely will add metolazone if continues with sluggish diuresis.  - Supp K as needed.  - No BB with recurrent low output.  - Currently off ARNI and spiro due to recent AKI and hyperkalemia.Marland Kitchen.  - Intolerant bidil with difficulty with headaches, dizziness and hypotension.  Low voltage QRS- check SPEP UPEP. Repeat ECHO.  2. CAD s/p NSTEMI and CABG 10/12  - Severe native CAD with patent grafts on LHC 12/13/15. No CP - Continue ASA 81 mg. Re start atorvastatin 40 mg daily.  3. Morbid obesity 4. Pulmonary HTN - mild to mod RHC 12/13/15. PVR 3.24.  5. CKD stage III 6. DM2 -place on sliding scale  7. Day time fatigue  Length  of Stay: 1  Luane SchoolMichael Andrew Tillery, PA-C  10/17/2016, 11:49 AM  Advanced Heart Failure Team Pager [phone] (M-F; 7a - 4p)  Please contact [facility] for night-coverage after hours (4p -7a ) and weekends on [email or web address]  Patient seen and examined with Otilio SaberAndy Tillery, PA-C. We discussed all aspects of the encounter. I agree with the assessment and plan as stated above.   She is markedly volume overloaded again. CVP 24.  With poor response to  IV diuresis. Renal function up but stable. Will switch to lasix gtt at 20. Continue inotrope support. Supp K as need. Watch renal function closely. Gala Romney, Gualberto Wahlen,MD 8:48 PM

## 2016-10-17 NOTE — Care Management Note (Addendum)
Case Management Note  Patient Details  Name: Christine Knight MRN: [id number] Date of Birth: 02-May-1951  Subjective/Objective:   Pt presented for Acute on Chronic CHF. Initiated on IV Lasix and Milrinone gtt.  Pt independent at home. Has family support.                  Action/Plan: No needs identified at this time. CM will continue to monitor.  Expected Discharge Date:                  Expected Discharge Plan:  Home/Self Care  In-House Referral:     Discharge planning Services  CM Consult  Post Acute Care Choice:   Home Health Choice offered to:   Patient  DME Arranged:   N/A DME Agency:   N/A  HH Arranged:   RN, SW, PT HH Agency:   [facility]  Status of Service: Completed If discussed at Long Length of Stay Meetings, dates discussed:  10-22-16,10-24-16, 10-29-16  Additional Comments: 11-01-16 [address] Wallie Lagrand Graves-Bigelow, RN,BSN [phone] PT and SW [facility] Plan for home over the weekend. No further needs from CM @ this time.    10-29-16 [address] Chyanne Kohut Graves-Bigelow, [place] [phone] CM did speak with pt in regards [facility] and made her aware that she was discussed in the [facility]. Physician Advisor recommended [facility]) Health Risk Initiative [facility]. CM did [facility] with pt [facility]. Pt is now agreeable. CM did offer choice via agency list and [facility]. CM did make referral and SOC to begin within 24-48 hours post d/c. CM still monitoring to see if pt will need Home Milrinone. No further needs at this time.     1439 10-24-16 Tomi BambergerBrenda Graves-Bigelow, RN,BSN [phone]  CM did [facility] to patient [facility]. Pt is refusing HH Services at this time. Pt continues on IV Milrinone and IV Lasix gtts. Pt states she will not be homebound and will need to go shopping once d/c. No further needs at this time.   1350 10-22-16 Tomi BambergerBrenda Graves-Bigelow, RN,BSN [phone] Pt  continues on IV Milrinone gtt and IV Lasix gtt. CM will continue to monitor.   Gala LewandowskyGraves-Bigelow, Maayan Jenning Kaye, RN 10/17/2016, 1:00 PM

## 2016-10-17 NOTE — Progress Notes (Signed)
Inpatient Diabetes Program Recommendations  AACE/ADA: New Consensus Statement on Inpatient Glycemic Control (2015)  Target Ranges:  Prepandial:   less than 140 mg/dL      Peak postprandial:   less than 180 mg/dL (1-2 hours)      Critically ill patients:  140 - 180 mg/dL   Lab Results  Component Value Date   GLUCAP 204 (H) 09/08/2016   HGBA1C 7.0 (H) 09/03/2016    Review of Glycemic Control:  Results for Christine Knight, Christine Knight (MRN [id number]) as of 10/17/2016 09:42  Ref. Range 10/17/2016 05:43  Glucose Latest Ref Range: 65 - 99 mg/dL 409299 (H)    Diabetes history: Type 2 diabetes Outpatient Diabetes medications: none listed Current orders for Inpatient glycemic control:  Novolog moderate tid with meals  Inpatient Diabetes Program Recommendations:   Note that blood sugars increased per labs this morning.  To start Novolog correction today. If CBG's remain>180 mg/dL, consider adding basal insulin such as Lantus 18 units daily.  Thanks, Beryl MeagerJenny Fronnie Urton, RN, BC-ADM Inpatient Diabetes Coordinator Pager [phone] (8a-5p)

## 2016-10-18 ENCOUNTER — Encounter (HOSPITAL_COMMUNITY): Payer: Medicare Other

## 2016-10-18 LAB — BASIC METABOLIC PANEL
Anion gap: 11 (ref 5–15)
BUN: 37 mg/dL — AB (ref 6–20)
CALCIUM: 9.3 mg/dL (ref 8.9–10.3)
CO2: 28 mmol/L (ref 22–32)
CREATININE: 1.38 mg/dL — AB (ref 0.44–1.00)
Chloride: 95 mmol/L — ABNORMAL LOW (ref 101–111)
GFR calc Af Amer: 45 mL/min — ABNORMAL LOW (ref >60)
GFR, EST NON AFRICAN AMERICAN: 39 mL/min — AB (ref >60)
GLUCOSE: 257 mg/dL — AB (ref 65–99)
Potassium: 3.9 mmol/L (ref 3.5–5.1)
Sodium: 134 mmol/L — ABNORMAL LOW (ref 135–145)

## 2016-10-18 LAB — GLUCOSE, CAPILLARY
GLUCOSE-CAPILLARY: 194 mg/dL — AB (ref 65–99)
Glucose-Capillary: 264 mg/dL — ABNORMAL HIGH (ref 65–99)
Glucose-Capillary: 281 mg/dL — ABNORMAL HIGH (ref 65–99)
Glucose-Capillary: 205 mg/dL — ABNORMAL HIGH (ref 65–99)

## 2016-10-18 LAB — COOXEMETRY PANEL
Carboxyhemoglobin: 1.6 % — ABNORMAL HIGH (ref 0.5–1.5)
Methemoglobin: 1.1 % (ref 0.0–1.5)
O2 Saturation: 66.1 %
Total hemoglobin: 11.8 g/dL — ABNORMAL LOW (ref 12.0–16.0)

## 2016-10-18 MED ORDER — POTASSIUM CHLORIDE CRYS ER 20 MEQ PO TBCR
40.0000 meq | EXTENDED_RELEASE_TABLET | Freq: Two times a day (BID) | ORAL | Status: DC
  Administered 2016-10-18 – 2016-10-20 (×5): 40 meq via ORAL
  Filled 2016-10-18 (×5): qty 2

## 2016-10-18 MED ORDER — INSULIN GLARGINE 100 UNIT/ML ~~LOC~~ SOLN
18.0000 [IU] | Freq: Every day | SUBCUTANEOUS | Status: DC
  Administered 2016-10-18 – 2016-10-31 (×14): 18 [IU] via SUBCUTANEOUS
  Filled 2016-10-18 (×15): qty 0.18

## 2016-10-18 MED ORDER — METOLAZONE 5 MG PO TABS: 2.5000 mg | ORAL_TABLET | Freq: Two times a day (BID) | ORAL | Status: DC

## 2016-10-18 MED ORDER — METOLAZONE 5 MG PO TABS
5.0000 mg | ORAL_TABLET | Freq: Two times a day (BID) | ORAL | Status: DC
  Administered 2016-10-18 – 2016-10-30 (×25): 5 mg via ORAL
  Filled 2016-10-18 (×25): qty 1

## 2016-10-18 NOTE — Progress Notes (Addendum)
Inpatient Diabetes Program Recommendations  AACE/ADA: New Consensus Statement on Inpatient Glycemic Control (2015)  Target Ranges:  Prepandial:   less than 140 mg/dL      Peak postprandial:   less than 180 mg/dL (1-2 hours)      Critically ill patients:  140 - 180 mg/dL   Lab Results  Component Value Date   GLUCAP 281 (H) 10/18/2016   HGBA1C 7.0 (H) 09/03/2016    Review of Glycemic Control:  Results for Christine Knight, Christine (MRN [id number]) as of 10/18/2016 12:05  Ref. Range 10/17/2016 11:24 10/17/2016 16:36 10/17/2016 20:49 10/18/2016 08:36 10/18/2016 11:17  Glucose-Capillary Latest Ref Range: 65 - 99 mg/dL 782346 (H) 956259 (H) 213235 (H) 264 (H) 281 (H)   Diabetes history: Type 2 diabetes Outpatient Diabetes medications: No meds listed Current orders for Inpatient glycemic control:  Novolog moderate tid with meals Inpatient Diabetes Program Recommendations:   Please consider adding Lantus 18 units daily.  Called PA and orders received.   Thanks, Beryl MeagerJenny Nike Southwell, RN, BC-ADM Inpatient Diabetes Coordinator Pager [phone] (8a-5p)

## 2016-10-18 NOTE — Progress Notes (Signed)
Advanced Heart Failure Rounding Note  PCP: Arnette FeltsMike Duran  HF: Dr. Gala RomneyBensimhon   Subjective:    Admitted from clinic 10/16/16 with marked volume overload in setting of dietary indiscretion despite increase in diuretics.   Urine output remains sluggish with transition to lasix gtt at 20 mg/hr.  Creatinine stable. Also on milrinone 0.25 mcg/kg/min. Coox 66.1%.   Frustrated her weight is not coming down. Feels like she is not peeing very much. SOB walking around room.   Of note, patient ate a sleeve of [employer]. (24% daily sodium intake)  I/O show positive yesterday. Renal function stable.   Objective:   Weight Range: 263 lb 9.6 oz (119.6 kg) Body mass index is 42.55 kg/m.   Vital Signs:   Temp:  [97.8 F (36.6 C)-98.6 F (37 C)] 98.4 F (36.9 C) (12/08 0500) Pulse Rate:  [71-89] 81 (12/08 0500) Resp:  [15-18] 18 (12/08 0500) BP: (97-126)/(57-71) 115/71 (12/08 0500) SpO2:  [94 %-100 %] 99 % (12/08 0500) Weight:  [263 lb 9.6 oz (119.6 kg)] 263 lb 9.6 oz (119.6 kg) (12/08 0500)    Weight change: Filed Weights   10/16/16 1552 10/17/16 0449 10/18/16 0500  Weight: 262 lb 3.2 oz (118.9 kg) 259 lb 9.6 oz (117.8 kg) 263 lb 9.6 oz (119.6 kg)    Intake/Output:   Intake/Output Summary (Last 24 hours) at 10/18/16 1025 Last data filed at 10/18/16 0500  Gross per 24 hour  Intake           1291.1 ml  Output              850 ml  Net            441.1 ml     Physical Exam: General:Dyspneic with minimal exertion.   HEENT: Normal Neck: JVP elevated up to ear. Carotids 2+ bilat; no bruits. No thyromegaly or nodule noted.  Cor: PMI nondisplaced. Distant, Irregular No M/G/R Lungs: Diminished basilar sounds.  Abdomen: Morbidly obese, soft, NT, ND, no HSM. No bruits or masses. +BS  Extremities: no cyanosis, clubbing, rash. R and LLE 3 +edema extends to thighs.  Neuro: alert & orientedx3, cranial nerves grossly intact. moves all 4 extremities w/o difficulty. Affect  pleasant  Tele: Reviewed personally, Showing Afib this am.   Labs: CBC  Recent Labs  10/16/16 1511 10/16/16 1604  WBC 5.6 5.1  HGB 12.8 12.9  HCT 39.6 40.3  MCV 93.2 92.9  PLT 310 329   Basic Metabolic Panel  Recent Labs  10/16/16 1511  10/17/16 0543 10/18/16 0450  NA 135  --  136 134*  K 4.5  --  3.7 3.9  CL 98*  --  98* 95*  CO2 26  --  26 28  GLUCOSE 322*  --  299* 257*  BUN 38*  --  34* 37*  CREATININE 1.48*  < > 1.33* 1.38*  CALCIUM 9.1  --  9.1 9.3  MG 2.1  --   --   --   < > = values in this interval not displayed. Liver Function Tests  Recent Labs  10/16/16 1511  AST 46*  ALT 16  ALKPHOS 123  BILITOT 1.6*  PROT 7.7  ALBUMIN 3.6   No results for input(s): LIPASE, AMYLASE in the last 72 hours. Cardiac Enzymes  Recent Labs  10/16/16 1514  TROPONINI 0.05*    BNP: BNP (last 3 results)  Recent Labs  08/25/16 2119 09/26/16 1442 10/16/16 1512  BNP 1,359.9* 1,058.8* 903.7*  ProBNP (last 3 results) No results for input(s): PROBNP in the last 8760 hours.   D-Dimer No results for input(s): DDIMER in the last 72 hours. Hemoglobin A1C No results for input(s): HGBA1C in the last 72 hours. Fasting Lipid Panel No results for input(s): CHOL, HDL, LDLCALC, TRIG, CHOLHDL, LDLDIRECT in the last 72 hours. Thyroid Function Tests  Recent Labs  10/16/16 1512  TSH 5.699*  T3FREE 2.8    Other results:     Imaging/Studies:  Dg Chest Port 1 View  Result Date: 10/17/2016 CLINICAL DATA:  Status post PICC line placement. EXAM: PORTABLE CHEST 1 VIEW COMPARISON:  Portable chest x-ray of October 17, 2016 FINDINGS: Patient has undergone PICC line placement via the left upper extremity. The tip of the catheter overlies the midportion of the SVC. There is no postprocedure pneumothorax. The heart remains enlarged. The pulmonary vascularity is mildly engorged. There is small left pleural effusion. There are post CABG changes. There is calcification in  the wall of the aortic arch. IMPRESSION: No postprocedure complication following left-sided PICC line placement. The tip of the catheter overlies the midportion of the SVC. Electronically Signed   By: David  SwazilandJordan M.D.   On: 10/17/2016 09:35   Dg Chest Port 1 View  Result Date: 10/17/2016 CLINICAL DATA:  Shortness of Breath EXAM: PORTABLE CHEST 1 VIEW COMPARISON:  08/25/2016 FINDINGS: Cardiomegaly with vascular congestion. Right CA P he. Patchy in airspace at a shin prominent on like legs edema similar prior study. Suspect small. IMPRESSION: Cardiomegaly with vascular congestion probable mild edema. Small effusion. No real change. Electronically Signed   By: Charlett NoseKevin  Dover M.D.   On: 10/17/2016 07:56    Latest Echo  Latest Cath   Medications:     Scheduled Medications: . aspirin EC  81 mg Oral Daily  . enoxaparin (LOVENOX) injection  40 mg Subcutaneous Q24H  . gabapentin  300 mg Oral TID  . insulin aspart  0-15 Units Subcutaneous TID WC  . metolazone  2.5 mg Oral BID  . potassium chloride  20 mEq Oral BID  . sodium chloride flush  3 mL Intravenous Q12H    Infusions: . furosemide (LASIX) infusion 20 mg/hr (10/18/16 0145)  . milrinone 0.25 mcg/kg/min (10/18/16 0024)    PRN Medications: sodium chloride, acetaminophen, ondansetron (ZOFRAN) IV, sodium chloride flush   Assessment/Plan   1. Acute/Chronic systolic HF due to iCM EF 15-20% Echo 04/14/2016. - [facility]- EF 30-35% RV normal.  - NYHA IV.  - She remains markedly volume overloaded. Poor diuresis.  Currently creatinine is fine, but if continues to not diuresis, may need CVVHD.  - remains on milrinone with poor response to IV diuretics - Continue lasix gtt at 20 mg/hr. CVP 24 - Add metolazone 5 mg BID. Supp K.  - No BB with recurrent low output.  - Currently off ARNI and spiro due to recent AKI and hyperkalemia.Marland Kitchen.  - Intolerant bidil with difficulty with headaches, dizziness and hypotension.   Low voltage QRS- check SPEP UPEP. Repeat ECHO.  2. CAD s/p NSTEMI and CABG 10/12  - Severe native CAD with patent grafts on LHC 12/13/15. No CP - Continue ASA and statin.   3. Morbid obesity 4. Pulmonary HTN - mild to mod RHC 12/13/15. PVR 3.24.  5. CKD stage III 6. DM2 -place on sliding scale  7. Day time fatigue 8. ? PAF - Tele reading as Afib. On Admission, EKG read by EP as "[facility],  PACs and intermittent 83 bpm low voltage QRS" - Will check EKG - If Afib, will need Anticoag.    - CHA2DS2-VASc Score would be 6 if Afib diagnosed.    Length of Stay: 2  Luane School  10/18/2016, 10:25 AM  Advanced Heart Failure Team Pager [phone] (M-F; 7a - 4p)  Please contact [facility] for night-coverage after hours (4p -7a ) and weekends on [email or web address]'  Patient seen and examined with Otilio Saber, PA-C. We discussed all aspects of the encounter. I agree with the assessment and plan as stated above.   Remains on milrinone and lasix gtt. CVP very high. Co-ox ok. Still with poor urine output. Agree with adding metolazone. ECG looks like sinus with periods of junctional rhythm. Also low volts.  SPEP/UPEP in progress. Renal function stable.   Miki Blank,MD 3:39 PM

## 2016-10-19 LAB — BASIC METABOLIC PANEL
Anion gap: 10 (ref 5–15)
BUN: 35 mg/dL — AB (ref 6–20)
CO2: 30 mmol/L (ref 22–32)
Calcium: 9.3 mg/dL (ref 8.9–10.3)
Chloride: 94 mmol/L — ABNORMAL LOW (ref 101–111)
Creatinine, Ser: 1.28 mg/dL — ABNORMAL HIGH (ref 0.44–1.00)
GFR calc Af Amer: 50 mL/min — ABNORMAL LOW (ref >60)
GFR, EST NON AFRICAN AMERICAN: 43 mL/min — AB (ref >60)
Glucose, Bld: 156 mg/dL — ABNORMAL HIGH (ref 65–99)
POTASSIUM: 3.6 mmol/L (ref 3.5–5.1)
SODIUM: 134 mmol/L — AB (ref 135–145)

## 2016-10-19 LAB — GLUCOSE, CAPILLARY
GLUCOSE-CAPILLARY: 223 mg/dL — AB (ref 65–99)
Glucose-Capillary: 104 mg/dL — ABNORMAL HIGH (ref 65–99)
Glucose-Capillary: 172 mg/dL — ABNORMAL HIGH (ref 65–99)
Glucose-Capillary: 173 mg/dL — ABNORMAL HIGH (ref 65–99)

## 2016-10-19 LAB — COOXEMETRY PANEL
Carboxyhemoglobin: 1.6 % — ABNORMAL HIGH (ref 0.5–1.5)
METHEMOGLOBIN: 0.8 % (ref 0.0–1.5)
O2 Saturation: 65.5 %
TOTAL HEMOGLOBIN: 11.7 g/dL — AB (ref 12.0–16.0)

## 2016-10-19 MED ORDER — ENOXAPARIN SODIUM 60 MG/0.6ML ~~LOC~~ SOLN
60.0000 mg | SUBCUTANEOUS | Status: DC
  Filled 2016-10-19 (×3): qty 0.6

## 2016-10-19 NOTE — Progress Notes (Signed)
Advanced Heart Failure Rounding Note  PCP: Arnette FeltsMike Duran  HF: Dr. Gala RomneyBensimhon   Subjective:    Admitted from clinic 10/16/16 with marked volume overload in setting of dietary indiscretion despite increase in diuretics.   Urine output has picked up with milrinone, lasix gtt at 20 mg/hr and addition of metolazone. Out nearly 3L but weight up a pound.   Coox 66%. Creatinine improved  RHC 2/17 RA 25 RV 92/26 PA 86/34 (56) PCWP 37 Fick 5.8/2.4 PVR 3.3   Objective:   Weight Range: 119.9 kg (264 lb 4.8 oz) Body mass index is 42.66 kg/m.   Vital Signs:   Temp:  [97.4 F (36.3 C)-98.2 F (36.8 C)] 97.4 F (36.3 C) (12/09 0500) Pulse Rate:  [67-86] 67 (12/08 2100) Resp:  [18-21] 18 (12/09 0500) BP: (103-111)/(53-59) 109/59 (12/09 0500) SpO2:  [97 %-100 %] 99 % (12/09 0500) Weight:  [119.9 kg (264 lb 4.8 oz)] 119.9 kg (264 lb 4.8 oz) (12/09 0500)    Weight change: Filed Weights   10/17/16 0449 10/18/16 0500 10/19/16 0500  Weight: 117.8 kg (259 lb 9.6 oz) 119.6 kg (263 lb 9.6 oz) 119.9 kg (264 lb 4.8 oz)    Intake/Output:   Intake/Output Summary (Last 24 hours) at 10/19/16 0834 Last data filed at 10/19/16 0512  Gross per 24 hour  Intake              960 ml  Output             2725 ml  Net            -1765 ml     Physical Exam: General:Sitting in chair. NAD HEENT: Normal Neck: JVP elevated up to ear. Carotids 2+ bilat; no bruits. No thyromegaly or nodule noted.  Cor: PMI nondisplaced. Distant, Irregular No M/G/R Lungs: Diminished basilar sounds.  Abdomen: Morbidly obese, soft, NT, ND, no HSM. No bruits or masses. +BS  Extremities: no cyanosis, clubbing, rash. R and LLE 3 +edema extends to thighs.  Neuro: alert & orientedx3, cranial nerves grossly intact. moves all 4 extremities w/o difficulty. Affect pleasant  Tele: Reviewed personally, likely SR with frequent PACs and PVCs  Labs: CBC  Recent Labs  10/16/16 1511 10/16/16 1604  WBC 5.6 5.1  HGB 12.8  12.9  HCT 39.6 40.3  MCV 93.2 92.9  PLT 310 329   Basic Metabolic Panel  Recent Labs  10/16/16 1511  10/18/16 0450 10/19/16 0440  NA 135  < > 134* 134*  K 4.5  < > 3.9 3.6  CL 98*  < > 95* 94*  CO2 26  < > 28 30  GLUCOSE 322*  < > 257* 156*  BUN 38*  < > 37* 35*  CREATININE 1.48*  < > 1.38* 1.28*  CALCIUM 9.1  < > 9.3 9.3  MG 2.1  --   --   --   < > = values in this interval not displayed. Liver Function Tests  Recent Labs  10/16/16 1511  AST 46*  ALT 16  ALKPHOS 123  BILITOT 1.6*  PROT 7.7  ALBUMIN 3.6   No results for input(s): LIPASE, AMYLASE in the last 72 hours. Cardiac Enzymes  Recent Labs  10/16/16 1514  TROPONINI 0.05*    BNP: BNP (last 3 results)  Recent Labs  08/25/16 2119 09/26/16 1442 10/16/16 1512  BNP 1,359.9* 1,058.8* 903.7*    ProBNP (last 3 results) No results for input(s): PROBNP in the last 8760 hours.  D-Dimer No results for input(s): DDIMER in the last 72 hours. Hemoglobin A1C No results for input(s): HGBA1C in the last 72 hours. Fasting Lipid Panel No results for input(s): CHOL, HDL, LDLCALC, TRIG, CHOLHDL, LDLDIRECT in the last 72 hours. Thyroid Function Tests  Recent Labs  10/16/16 1512  TSH 5.699*  T3FREE 2.8    Other results:     Imaging/Studies:  Dg Chest Port 1 View  Result Date: 10/17/2016 CLINICAL DATA:  Status post PICC line placement. EXAM: PORTABLE CHEST 1 VIEW COMPARISON:  Portable chest x-ray of October 17, 2016 FINDINGS: Patient has undergone PICC line placement via the left upper extremity. The tip of the catheter overlies the midportion of the SVC. There is no postprocedure pneumothorax. The heart remains enlarged. The pulmonary vascularity is mildly engorged. There is small left pleural effusion. There are post CABG changes. There is calcification in the wall of the aortic arch. IMPRESSION: No postprocedure complication following left-sided PICC line placement. The tip of the catheter overlies  the midportion of the SVC. Electronically Signed   By: David  [place] M.D.   On: 10/17/2016 09:35    Latest Echo  Latest Cath   Medications:     Scheduled Medications: . aspirin EC  81 mg Oral Daily  . enoxaparin (LOVENOX) injection  40 mg Subcutaneous Q24H  . gabapentin  300 mg Oral TID  . insulin aspart  0-15 Units Subcutaneous TID WC  . insulin glargine  18 Units Subcutaneous Daily  . metolazone  5 mg Oral BID  . potassium chloride  40 mEq Oral BID  . sodium chloride flush  3 mL Intravenous Q12H    Infusions: . furosemide (LASIX) infusion 20 mg/hr (10/18/16 0145)  . milrinone 0.25 mcg/kg/min (10/19/16 0135)    PRN Medications: sodium chloride, acetaminophen, ondansetron (ZOFRAN) IV, sodium chloride flush   Assessment/Plan   1. 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 - [facility]- EF 30-35% RV normal.  - Echo on 12/7 with poor imaged LVEF probably in 35-40% range but severe RV failure and PAH - NYHA IV.  - She remains markedly volume overloaded.  - urine output picking up on current regimen but I/Os still positive. Increase lasix gtt to 30/hr - continue metolazone and milrinone. Can add acetazolamide as needed - Currently off ARNI and spiro due to recent AKI and hyperkalemia.Marland Kitchen.  - Intolerant bidil with difficulty with headaches, dizziness and hypotension.  - no b-blocker with need for inotropes. - Low voltage QRS- check SPEP UPEP.  2. CAD s/p NSTEMI and CABG 10/12  - Severe native CAD with patent grafts on LHC 12/13/15. No CP - Continue ASA and statin.   3. Morbid obesity 4. Pulmonary HTN - mild to mod RHC 12/13/15. PVR 3.24.  5. CKD stage III 6. DM2 -place on sliding scale   I reviewed echo personally from yesterday. There is moderate LV dysfunction but severe RV dysfunction with PAH this has previously been shown to be mostly pulmonary venous HTN. Diuresis has picked up but she is still positive due  to dietary noncompliance. Increase lasix. Start fluid restriction. She has low volts on ECG but likely related to obesity. Echo not suggestive of amyloid. Await SPEP/UPEP.    Length of Stay: 3  Arvilla MeresBensimhon, Cassi Jenne, MD  10/19/2016, 8:34 AM  Advanced Heart Failure Team Pager [phone] (M-F; 7a - 4p)  Please contact [facility] for night-coverage after hours (4p -7a ) and weekends on [email or web address]'

## 2016-10-20 LAB — GLUCOSE, CAPILLARY
GLUCOSE-CAPILLARY: 189 mg/dL — AB (ref 65–99)
Glucose-Capillary: 200 mg/dL — ABNORMAL HIGH (ref 65–99)
Glucose-Capillary: 198 mg/dL — ABNORMAL HIGH (ref 65–99)
Glucose-Capillary: 262 mg/dL — ABNORMAL HIGH (ref 65–99)

## 2016-10-20 LAB — BASIC METABOLIC PANEL WITH GFR
Anion gap: 11 (ref 5–15)
BUN: 35 mg/dL — ABNORMAL HIGH (ref 6–20)
CO2: 32 mmol/L (ref 22–32)
Calcium: 9.4 mg/dL (ref 8.9–10.3)
Chloride: 90 mmol/L — ABNORMAL LOW (ref 101–111)
Creatinine, Ser: 1.27 mg/dL — ABNORMAL HIGH (ref 0.44–1.00)
GFR calc Af Amer: 50 mL/min — ABNORMAL LOW
GFR calc non Af Amer: 43 mL/min — ABNORMAL LOW
Glucose, Bld: 207 mg/dL — ABNORMAL HIGH (ref 65–99)
Potassium: 3.2 mmol/L — ABNORMAL LOW (ref 3.5–5.1)
Sodium: 133 mmol/L — ABNORMAL LOW (ref 135–145)

## 2016-10-20 LAB — COOXEMETRY PANEL
Carboxyhemoglobin: 2 % — ABNORMAL HIGH (ref 0.5–1.5)
Methemoglobin: 0.7 % (ref 0.0–1.5)
O2 Saturation: 61.3 %
Total hemoglobin: 12.5 g/dL (ref 12.0–16.0)

## 2016-10-20 MED ORDER — POTASSIUM CHLORIDE CRYS ER 20 MEQ PO TBCR
40.0000 meq | EXTENDED_RELEASE_TABLET | Freq: Three times a day (TID) | ORAL | Status: DC
  Administered 2016-10-20 – 2016-10-23 (×11): 40 meq via ORAL
  Filled 2016-10-20 (×12): qty 2

## 2016-10-20 MED ORDER — POTASSIUM CHLORIDE CRYS ER 20 MEQ PO TBCR
40.0000 meq | EXTENDED_RELEASE_TABLET | Freq: Once | ORAL | Status: AC
Start: 2016-10-20 — End: 2016-10-20
  Administered 2016-10-20: 40 meq via ORAL
  Filled 2016-10-20: qty 2

## 2016-10-20 MED ORDER — SPIRONOLACTONE 25 MG PO TABS
12.5000 mg | ORAL_TABLET | Freq: Every day | ORAL | Status: DC
  Administered 2016-10-20 – 2016-10-21 (×2): 12.5 mg via ORAL
  Filled 2016-10-20 (×3): qty 1

## 2016-10-20 NOTE — Progress Notes (Signed)
Advanced Heart Failure Rounding Note  PCP: Arnette FeltsMike Duran  HF: Dr. Gala RomneyBensimhon   Subjective:    Admitted from clinic 10/16/16 with marked volume overload in setting of dietary indiscretion despite increase in diuretics.   Urine output has picked up dramatically with milrinone, lasix gtt at 20 mg/hr and addition of metolazone. Out over 6L. Weight down 6 pounds.   Coox 61%. Creatinine stable. Feeling better.   RHC 2/17 RA 25 RV 92/26 PA 86/34 (56) PCWP 37 Fick 5.8/2.4 PVR 3.3   Objective:   Weight Range: 117 kg (258 lb) Body mass index is 41.64 kg/m.   Vital Signs:   Temp:  [97.6 F (36.4 C)-98.2 F (36.8 C)] 98 F (36.7 C) (12/10 0500) Pulse Rate:  [68-84] 84 (12/10 0500) Resp:  [15-19] 15 (12/10 0500) BP: (114-120)/(58-67) 114/58 (12/10 0500) SpO2:  [96 %-98 %] 96 % (12/10 0500) Weight:  [117 kg (258 lb)] 117 kg (258 lb) (12/10 0500)    Weight change: Filed Weights   10/18/16 0500 10/19/16 0500 10/20/16 0500  Weight: 119.6 kg (263 lb 9.6 oz) 119.9 kg (264 lb 4.8 oz) 117 kg (258 lb)    Intake/Output:   Intake/Output Summary (Last 24 hours) at 10/20/16 0850 Last data filed at 10/20/16 0600  Gross per 24 hour  Intake           2766.1 ml  Output             5100 ml  Net          -2333.9 ml     Physical Exam: General:Sitting in chair. NAD HEENT: Normal Neck: JVP elevated up to ear. Carotids 2+ bilat; no bruits. No thyromegaly or nodule noted.  Cor: PMI nondisplaced. Distant, Irregular No M/G/R Lungs: Diminished basilar sounds.  Abdomen: Morbidly obese, soft, NT, ND, no HSM. No bruits or masses. +BS  Extremities: no cyanosis, clubbing, rash. R and LLE 3 +edema extends to thighs.  Neuro: alert & orientedx3, cranial nerves grossly intact. moves all 4 extremities w/o difficulty. Affect pleasant  Tele: Reviewed personally, likely SR with frequent PACs and PVCs  Labs: CBC No results for input(s): WBC, NEUTROABS, HGB, HCT, MCV, PLT in the last 72  hours. Basic Metabolic Panel  Recent Labs  10/19/16 0440 10/20/16 0540  NA 134* 133*  K 3.6 3.2*  CL 94* 90*  CO2 30 32  GLUCOSE 156* 207*  BUN 35* 35*  CREATININE 1.28* 1.27*  CALCIUM 9.3 9.4   Liver Function Tests No results for input(s): AST, ALT, ALKPHOS, BILITOT, PROT, ALBUMIN in the last 72 hours. No results for input(s): LIPASE, AMYLASE in the last 72 hours. Cardiac Enzymes No results for input(s): CKTOTAL, CKMB, CKMBINDEX, TROPONINI in the last 72 hours.  BNP: BNP (last 3 results)  Recent Labs  08/25/16 2119 09/26/16 1442 10/16/16 1512  BNP 1,359.9* 1,058.8* 903.7*    ProBNP (last 3 results) No results for input(s): PROBNP in the last 8760 hours.   D-Dimer No results for input(s): DDIMER in the last 72 hours. Hemoglobin A1C No results for input(s): HGBA1C in the last 72 hours. Fasting Lipid Panel No results for input(s): CHOL, HDL, LDLCALC, TRIG, CHOLHDL, LDLDIRECT in the last 72 hours. Thyroid Function Tests No results for input(s): TSH, T4TOTAL, T3FREE, THYROIDAB in the last 72 hours.  Invalid input(s): FREET3  Other results:     Imaging/Studies:  No results found.  Latest Echo  Latest Cath   Medications:     Scheduled Medications: .  aspirin EC  81 mg Oral Daily  . enoxaparin (LOVENOX) injection  60 mg Subcutaneous Q24H  . gabapentin  300 mg Oral TID  . insulin aspart  0-15 Units Subcutaneous TID WC  . insulin glargine  18 Units Subcutaneous Daily  . metolazone  5 mg Oral BID  . potassium chloride  40 mEq Oral BID  . sodium chloride flush  3 mL Intravenous Q12H    Infusions: . furosemide (LASIX) infusion 30 mg/hr (10/20/16 0241)  . milrinone 0.25 mcg/kg/min (10/20/16 0823)    PRN Medications: sodium chloride, acetaminophen, ondansetron (ZOFRAN) IV, sodium chloride flush   Assessment/Plan   1. 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 - [facility]- EF  30-35% RV normal.  - Echo on 12/7 with poor imaged LVEF probably in 35-40% range but severe RV failure and PAH - NYHA IV.  - Urine output has picked up dramatically. But she remains markedly volume overloaded - Continue current regimen. - Currently off ARNI and spiro due to recent AKI and hyperkalemia.Marland Kitchen.  - Intolerant bidil with difficulty with headaches, dizziness and hypotension.  - no b-blocker with need for inotropes. - Low voltage QRS- check SPEP UPEP.  2. CAD s/p NSTEMI and CABG 10/12  - Severe native CAD with patent grafts on LHC 12/13/15. No CP - Continue ASA and statin.   3. Morbid obesity 4. Pulmonary HTN - mild to mod RHC 12/13/15. PVR 3.24.  5. CKD stage III 6. DM2 -place on sliding scale   I reviewed echo personally from Friday. There is moderate LV dysfunction but severe RV dysfunction with PAH this has previously been shown to be mostly pulmonary venous HTN. Diuresis has picked up markedly. She has low volts on ECG but likely related to obesity. Echo not suggestive of amyloid. Await SPEP/UPEP.    Length of Stay: 4  Arvilla MeresBensimhon, Daniel, MD  10/20/2016, 8:50 AM  Advanced Heart Failure Team Pager [phone] (M-F; 7a - 4p)  Please contact [facility] for night-coverage after hours (4p -7a ) and weekends on [email or web address]'

## 2016-10-21 ENCOUNTER — Encounter (HOSPITAL_COMMUNITY): Payer: Medicare Other

## 2016-10-21 LAB — COOXEMETRY PANEL
CARBOXYHEMOGLOBIN: 1.7 % — AB (ref 0.5–1.5)
METHEMOGLOBIN: 0.9 % (ref 0.0–1.5)
O2 SAT: 66.1 %
Total hemoglobin: 11.8 g/dL — ABNORMAL LOW (ref 12.0–16.0)

## 2016-10-21 LAB — BASIC METABOLIC PANEL
ANION GAP: 10 (ref 5–15)
BUN: 34 mg/dL — ABNORMAL HIGH (ref 6–20)
CHLORIDE: 87 mmol/L — AB (ref 101–111)
CO2: 34 mmol/L — AB (ref 22–32)
Calcium: 9.4 mg/dL (ref 8.9–10.3)
Creatinine, Ser: 1.32 mg/dL — ABNORMAL HIGH (ref 0.44–1.00)
GFR calc non Af Amer: 41 mL/min — ABNORMAL LOW (ref >60)
GFR, EST AFRICAN AMERICAN: 48 mL/min — AB (ref >60)
Glucose, Bld: 230 mg/dL — ABNORMAL HIGH (ref 65–99)
POTASSIUM: 3.4 mmol/L — AB (ref 3.5–5.1)
Sodium: 131 mmol/L — ABNORMAL LOW (ref 135–145)

## 2016-10-21 LAB — PROTEIN ELECTROPHORESIS, SERUM
A/G Ratio: 1 (ref 0.7–1.7)
ALBUMIN ELP: 3.7 g/dL (ref 2.9–4.4)
ALPHA-1-GLOBULIN: 0.2 g/dL (ref 0.0–0.4)
Alpha-2-Globulin: 0.6 g/dL (ref 0.4–1.0)
BETA GLOBULIN: 1 g/dL (ref 0.7–1.3)
GAMMA GLOBULIN: 2.1 g/dL — AB (ref 0.4–1.8)
Globulin, Total: 3.8 g/dL (ref 2.2–3.9)
Total Protein ELP: 7.5 g/dL (ref 6.0–8.5)

## 2016-10-21 LAB — GLUCOSE, CAPILLARY
GLUCOSE-CAPILLARY: 206 mg/dL — AB (ref 65–99)
GLUCOSE-CAPILLARY: 258 mg/dL — AB (ref 65–99)
Glucose-Capillary: 239 mg/dL — ABNORMAL HIGH (ref 65–99)
Glucose-Capillary: 239 mg/dL — ABNORMAL HIGH (ref 65–99)

## 2016-10-21 NOTE — Care Management Important Message (Signed)
Important Message  Patient Details  Name: Christine Knight MRN: [id number] Date of Birth: 26-Oct-1951   Medicare Important Message Given:  Yes    [name] 10/21/2016, 10:04 AM

## 2016-10-21 NOTE — Progress Notes (Signed)
Advanced Heart Failure Rounding Note  PCP: Arnette FeltsMike Duran  HF: Dr. Gala RomneyBensimhon   Subjective:    Admitted from clinic 10/16/16 with marked volume overload in setting of dietary indiscretion despite increase in diuretics.   Urine output picked up dramatically with milrinone and addition of metolazone. Remains on lasix gtt at 20 mg/hr  Weight down another 5 lbs . Out 3L yesterday.    Coox 66.1% this am. Creatinine stable. Continues to diurese well and feel better.  CVP 18-19  RHC 2/17 RA 25 RV 92/26 PA 86/34 (56) PCWP 37 Fick 5.8/2.4 PVR 3.3   Objective:   Weight Range: 253 lb 8 oz (115 kg) Body mass index is 40.92 kg/m.   Vital Signs:   Temp:  [97.9 F (36.6 C)-99.1 F (37.3 C)] 98.5 F (36.9 C) (12/11 0807) Pulse Rate:  [69-83] 71 (12/11 0807) Resp:  [11-20] 15 (12/11 0807) BP: (114-119)/(58-68) 119/62 (12/11 0807) SpO2:  [97 %-100 %] 97 % (12/11 0807) Weight:  [253 lb 8 oz (115 kg)] 253 lb 8 oz (115 kg) (12/11 0434) Last BM Date: 10/16/16  Weight change: Filed Weights   10/19/16 0500 10/20/16 0500 10/21/16 0434  Weight: 264 lb 4.8 oz (119.9 kg) 258 lb (117 kg) 253 lb 8 oz (115 kg)    Intake/Output:   Intake/Output Summary (Last 24 hours) at 10/21/16 0909 Last data filed at 10/21/16 0720  Gross per 24 hour  Intake           1373.6 ml  Output             5150 ml  Net          -3776.4 ml     Physical Exam: General: Seated in chair. NAD.  HEENT: Normal Neck: JVP remains elevated to ear. Carotids 2+ bilat; no bruits. No thyromegaly or nodule noted.  Cor: PMI nondisplaced. Distant, Slightly irregular. No M/G/R noted.  Lungs: Decrease bibasilar sounds.  Abdomen: Morbidly obese, soft, NT, ND, no HSM. No bruits or masses. +BS  Extremities: no cyanosis, clubbing, rash. R and LLE 2-3+ edema remains into thighs.   Neuro: alert & orientedx3, cranial nerves grossly intact. moves all 4 extremities w/o difficulty. Affect pleasant  Tele: Reviewed personally, Sinus  rhythm with frequent PACs and PVCs. Some NSVT over night.   Labs: CBC No results for input(s): WBC, NEUTROABS, HGB, HCT, MCV, PLT in the last 72 hours. Basic Metabolic Panel  Recent Labs  10/20/16 0540 10/21/16 0602  NA 133* 131*  K 3.2* 3.4*  CL 90* 87*  CO2 32 34*  GLUCOSE 207* 230*  BUN 35* 34*  CREATININE 1.27* 1.32*  CALCIUM 9.4 9.4   Liver Function Tests No results for input(s): AST, ALT, ALKPHOS, BILITOT, PROT, ALBUMIN in the last 72 hours. No results for input(s): LIPASE, AMYLASE in the last 72 hours. Cardiac Enzymes No results for input(s): CKTOTAL, CKMB, CKMBINDEX, TROPONINI in the last 72 hours.  BNP: BNP (last 3 results)  Recent Labs  08/25/16 2119 09/26/16 1442 10/16/16 1512  BNP 1,359.9* 1,058.8* 903.7*    ProBNP (last 3 results) No results for input(s): PROBNP in the last 8760 hours.   D-Dimer No results for input(s): DDIMER in the last 72 hours. Hemoglobin A1C No results for input(s): HGBA1C in the last 72 hours. Fasting Lipid Panel No results for input(s): CHOL, HDL, LDLCALC, TRIG, CHOLHDL, LDLDIRECT in the last 72 hours. Thyroid Function Tests No results for input(s): TSH, T4TOTAL, T3FREE, THYROIDAB in the last 72  hours.  Invalid input(s): FREET3  Other results:     Imaging/Studies:  No results found.  Latest Echo  Latest Cath   Medications:     Scheduled Medications: . aspirin EC  81 mg Oral Daily  . enoxaparin (LOVENOX) injection  60 mg Subcutaneous Q24H  . gabapentin  300 mg Oral TID  . insulin aspart  0-15 Units Subcutaneous TID WC  . insulin glargine  18 Units Subcutaneous Daily  . metolazone  5 mg Oral BID  . potassium chloride  40 mEq Oral TID  . sodium chloride flush  3 mL Intravenous Q12H  . spironolactone  12.5 mg Oral Daily    Infusions: . furosemide (LASIX) infusion 30 mg/hr (10/21/16 0028)  . milrinone 0.25 mcg/kg/min (10/21/16 0839)    PRN Medications: sodium chloride, acetaminophen, ondansetron  (ZOFRAN) IV, sodium chloride flush   Assessment/Plan   1. 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 - [facility]CHO 07/2016 at [facility]- EF 30-35% RV normal.  - Echo on 12/7 with poor imaged LVEF probably in 35-40% range but severe RV failure and PAH - NYHA IV.  - Urine output much improved with addition of metolazone. Continue lasix gtt, metolazone, and milrinone as above.   - She remains markedly volume overloaded - Continue current regimen. - Currently off ARNI and spiro due to recent AKI and hyperkalemia.Marland Kitchen.  - Intolerant bidil with difficulty with headaches, dizziness and hypotension.  - no b-blocker with need for inotropes. - Low voltage QRS- Awaiting SPEP and UPEP.   2. CAD s/p NSTEMI and CABG 10/12  - Severe native CAD with patent grafts on LHC 12/13/15. No CP - Continue ASA and statin.   3. Morbid obesity 4. Pulmonary HTN - mild to mod RHC 12/13/15. PVR 3.24.  5. CKD stage III 6. DM2  - SSI while in house.    Length of Stay: 5  Graciella FreerMichael Andrew Tillery, [place] JerseyPA-C  10/21/2016, 9:09 AM  Advanced Heart Failure Team Pager [phone] (M-F; 7a - 4p)  Please contact [facility] for night-coverage after hours (4p -7a ) and weekends on [email or web address]  Patient seen and examined with Otilio SaberAndy Tillery, PA-C. We discussed all aspects of the encounter. I agree with the assessment and plan as stated above.   She continues to diurese on high-dose lasix and milrinone support. Renal function stable. Continue to supp K.   Bensimhon, Daniel,MD 9:36 AM

## 2016-10-22 LAB — BASIC METABOLIC PANEL
ANION GAP: 3 — AB (ref 5–15)
BUN: 40 mg/dL — AB (ref 6–20)
CALCIUM: 9.7 mg/dL (ref 8.9–10.3)
CO2: 42 mmol/L — ABNORMAL HIGH (ref 22–32)
Chloride: 87 mmol/L — ABNORMAL LOW (ref 101–111)
Creatinine, Ser: 1.27 mg/dL — ABNORMAL HIGH (ref 0.44–1.00)
GFR calc Af Amer: 50 mL/min — ABNORMAL LOW (ref >60)
GFR calc non Af Amer: 43 mL/min — ABNORMAL LOW (ref >60)
GLUCOSE: 223 mg/dL — AB (ref 65–99)
Potassium: 3 mmol/L — ABNORMAL LOW (ref 3.5–5.1)
Sodium: 132 mmol/L — ABNORMAL LOW (ref 135–145)

## 2016-10-22 LAB — GLUCOSE, CAPILLARY
GLUCOSE-CAPILLARY: 210 mg/dL — AB (ref 65–99)
Glucose-Capillary: 247 mg/dL — ABNORMAL HIGH (ref 65–99)
Glucose-Capillary: 253 mg/dL — ABNORMAL HIGH (ref 65–99)
Glucose-Capillary: 331 mg/dL — ABNORMAL HIGH (ref 65–99)
Glucose-Capillary: 233 mg/dL — ABNORMAL HIGH (ref 65–99)

## 2016-10-22 LAB — COOXEMETRY PANEL
Carboxyhemoglobin: 1.6 % — ABNORMAL HIGH (ref 0.5–1.5)
METHEMOGLOBIN: 1.1 % (ref 0.0–1.5)
O2 SAT: 56.9 %
TOTAL HEMOGLOBIN: 11.7 g/dL — AB (ref 12.0–16.0)

## 2016-10-22 MED ORDER — SPIRONOLACTONE 25 MG PO TABS
25.0000 mg | ORAL_TABLET | Freq: Every day | ORAL | Status: DC
  Administered 2016-10-22 – 2016-11-02 (×12): 25 mg via ORAL
  Filled 2016-10-22 (×11): qty 1

## 2016-10-22 MED ORDER — MILRINONE LACTATE IN DEXTROSE 20-5 MG/100ML-% IV SOLN
0.2500 ug/kg/min | INTRAVENOUS | Status: DC
  Administered 2016-10-22 – 2016-10-29 (×14): 0.25 ug/kg/min via INTRAVENOUS
  Filled 2016-10-22 (×14): qty 100

## 2016-10-22 NOTE — Progress Notes (Addendum)
Inpatient Diabetes Program Recommendations  AACE/ADA: New Consensus Statement on Inpatient Glycemic Control (2015)  Target Ranges:  Prepandial:   less than 140 mg/dL      Peak postprandial:   less than 180 mg/dL (1-2 hours)      Critically ill patients:  140 - 180 mg/dL   Lab Results  Component Value Date   GLUCAP 247 (H) 10/22/2016   HGBA1C 7.0 (H) 09/03/2016    Review of Glycemic ControlResults for [name], Christine (MRN [id number]) as of 10/22/2016 14:39  Ref. Range 10/21/2016 11:21 10/21/2016 16:36 10/21/2016 21:26 10/22/2016 07:18 10/22/2016 11:30  Glucose-Capillary Latest Ref Range: 65 - 99 mg/dL 409239 (H) 811206 (H) 914258 (H) 210 (H) 247 (H)   Diabetes history: Type 2 diabetes Outpatient Diabetes medications: None Current orders for Inpatient glycemic control:  Lantus 18 units daily, Novolog moderate tid with meals   Inpatient Diabetes Program Recommendations:    Spoke with patient regarding her refusal of rapid acting basal insulin.  She is concerned that her blood sugars are so high but has been refusing the Novolog b/c she "doesn't want to get hooked on insulin" .  She states that her A1C's have been good without medications, and she is concerned that if she takes insulin in the hospital, she will need it at home.  Explained that stress will increase blood sugars and that this may be the cause of her elevated blood sugars in the hospital.  Encouraged patient to not refuse the Novolog so that blood sugars will improve.  Consider increasing Lantus to 24 units daily.  Patient is adamant that she does not want to go home on insulin.  Will follow.  Thanks, Beryl MeagerJenny Ayvion Kavanagh, RN, BC-ADM Inpatient Diabetes Coordinator Pager [phone] (8a-5p)

## 2016-10-22 NOTE — Progress Notes (Signed)
Advanced Heart Failure Rounding Note  PCP: Arnette FeltsMike Duran  HF: Dr. Gala RomneyBensimhon   Subjective:    Admitted from clinic 10/16/16 with marked volume overload in setting of dietary indiscretion despite increase in diuretics.   Coox 57% this am. CVP 27. Remains on lasix drip 30 mg per hour + metolazone 5 mg bid + milrionone 0.25 mcg.    Complaining of leg pain.   RHC 2/17 RA 25 RV 92/26 PA 86/34 (56) PCWP 37 Fick 5.8/2.4 PVR 3.3   Objective:   Weight Range: 252 lb 1.6 oz (114.4 kg) Body mass index is 40.69 kg/m.   Vital Signs:   Temp:  [98.1 F (36.7 C)-98.9 F (37.2 C)] 98.4 F (36.9 C) (12/12 0418) Pulse Rate:  [70-88] 82 (12/12 0418) Resp:  [15-20] 20 (12/12 0418) BP: (100-119)/(58-80) 112/62 (12/12 0418) SpO2:  [91 %-99 %] 97 % (12/12 0418) Weight:  [252 lb 1.6 oz (114.4 kg)] 252 lb 1.6 oz (114.4 kg) (12/12 0418) Last BM Date: 10/16/16  Weight change: Filed Weights   10/20/16 0500 10/21/16 0434 10/22/16 0418  Weight: 258 lb (117 kg) 253 lb 8 oz (115 kg) 252 lb 1.6 oz (114.4 kg)    Intake/Output:   Intake/Output Summary (Last 24 hours) at 10/22/16 0743 Last data filed at 10/22/16 0500  Gross per 24 hour  Intake           1784.7 ml  Output             3900 ml  Net          -2115.3 ml     Physical Exam: General: Sitting in the chair.  NAD.  HEENT: Normal Neck: JVP remains elevated to ear. Carotids 2+ bilat; no bruits. No thyromegaly or nodule noted.  Cor: PMI nondisplaced. Distant, Slightly irregular. No M/G/R noted.  Lungs: Decrease bibasilar sounds.  Abdomen: Morbidly obese, soft, NT, ND, no HSM. No bruits or masses. +BS  Extremities: no cyanosis, clubbing, rash. R and LLE 3+ edema remains into thighs.  LUE PICC  Neuro: alert & orientedx3, cranial nerves grossly intact. moves all 4 extremities w/o difficulty. Affect pleasant  Tele: NSR  PVCs.    Labs: CBC No results for input(s): WBC, NEUTROABS, HGB, HCT, MCV, PLT in the last 72 hours. Basic  Metabolic Panel  Recent Labs  10/20/16 0540 10/21/16 0602  NA 133* 131*  K 3.2* 3.4*  CL 90* 87*  CO2 32 34*  GLUCOSE 207* 230*  BUN 35* 34*  CREATININE 1.27* 1.32*  CALCIUM 9.4 9.4   Liver Function Tests No results for input(s): AST, ALT, ALKPHOS, BILITOT, PROT, ALBUMIN in the last 72 hours. No results for input(s): LIPASE, AMYLASE in the last 72 hours. Cardiac Enzymes No results for input(s): CKTOTAL, CKMB, CKMBINDEX, TROPONINI in the last 72 hours.  BNP: BNP (last 3 results)  Recent Labs  08/25/16 2119 09/26/16 1442 10/16/16 1512  BNP 1,359.9* 1,058.8* 903.7*    ProBNP (last 3 results) No results for input(s): PROBNP in the last 8760 hours.   D-Dimer No results for input(s): DDIMER in the last 72 hours. Hemoglobin A1C No results for input(s): HGBA1C in the last 72 hours. Fasting Lipid Panel No results for input(s): CHOL, HDL, LDLCALC, TRIG, CHOLHDL, LDLDIRECT in the last 72 hours. Thyroid Function Tests No results for input(s): TSH, T4TOTAL, T3FREE, THYROIDAB in the last 72 hours.  Invalid input(s): FREET3  Other results:     Imaging/Studies:  No results found.  Latest Echo  Latest Cath   Medications:     Scheduled Medications: . aspirin EC  81 mg Oral Daily  . enoxaparin (LOVENOX) injection  60 mg Subcutaneous Q24H  . gabapentin  300 mg Oral TID  . insulin aspart  0-15 Units Subcutaneous TID WC  . insulin glargine  18 Units Subcutaneous Daily  . metolazone  5 mg Oral BID  . potassium chloride  40 mEq Oral TID  . sodium chloride flush  3 mL Intravenous Q12H  . spironolactone  12.5 mg Oral Daily    Infusions: . furosemide (LASIX) infusion 30 mg/hr (10/22/16 0359)  . milrinone 0.25 mcg/kg/min (10/21/16 1952)    PRN Medications: sodium chloride, acetaminophen, ondansetron (ZOFRAN) IV, sodium chloride flush   Assessment/Plan   1. 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 - [facility]- EF 30-35% RV normal.  - Echo on 12/7 with poor imaged LVEF probably in 35-40% range but severe RV failure and PAH - Continue lasix gtt, metolazone, and milrinone as above.  CVP 28. CO-OX marginal 57%. BMET pending. - Continue current regimen. - Currently off ARNI and spiro due to recent AKI and hyperkalemia.Marland Kitchen.  - Intolerant bidil with difficulty with headaches, dizziness and hypotension.  - no b-blocker with need for inotropes. - Low voltage QRS- SPEP no M spike.  UPEP.   Add unna boots.  2. CAD s/p NSTEMI and CABG 10/12  - Severe native CAD with patent grafts on LHC 12/13/15. No CP - Continue ASA and statin.   3. Morbid obesity 4. Pulmonary HTN - mild to mod RHC 12/13/15. PVR 3.24.  5. CKD stage III 6. DM2  - SSI while in house.    Length of Stay: 6  [name], NP  10/22/2016, 7:43 AM  Advanced Heart Failure Team Pager [phone] (M-F; 7a - 4p)  Please contact [facility] for night-coverage after hours (4p -7a ) and weekends on [email or web address]  Patient seen and examined with Tonye BecketAmy Clegg, NP. We discussed all aspects of the encounter. I agree with the assessment and plan as stated above.   Remains volume overloaded. Weight coming down slowly despite inotrope support and very aggressive diuretic regimen. Suspect po intake still remains high. Renal function remains stable.   Mithran Strike,MD 4:19 PM

## 2016-10-22 NOTE — Progress Notes (Signed)
Orthopedic Tech Progress Note Patient Details:  Christine BabeBarbara Rosenkranz 1951-04-21 [id number]  Ortho Devices Type of Ortho Device: Roland RackUnna boot Ortho Device/Splint Location: Bilateral unna boots Ortho Device/Splint Interventions: Application   Saul FordyceJennifer C Lamesha Tibbits 10/22/2016, 2:30 PM

## 2016-10-23 ENCOUNTER — Encounter (HOSPITAL_COMMUNITY): Payer: Medicare Other

## 2016-10-23 LAB — GLUCOSE, CAPILLARY
GLUCOSE-CAPILLARY: 100 mg/dL — AB (ref 65–99)
GLUCOSE-CAPILLARY: 131 mg/dL — AB (ref 65–99)
Glucose-Capillary: 171 mg/dL — ABNORMAL HIGH (ref 65–99)
Glucose-Capillary: 160 mg/dL — ABNORMAL HIGH (ref 65–99)

## 2016-10-23 LAB — COOXEMETRY PANEL
Carboxyhemoglobin: 1.8 % — ABNORMAL HIGH (ref 0.5–1.5)
METHEMOGLOBIN: 1.1 % (ref 0.0–1.5)
O2 Saturation: 67.6 %
Total hemoglobin: 11.7 g/dL — ABNORMAL LOW (ref 12.0–16.0)

## 2016-10-23 LAB — MAGNESIUM: Magnesium: 2.1 mg/dL (ref 1.7–2.4)

## 2016-10-23 LAB — BASIC METABOLIC PANEL
ANION GAP: 10 (ref 5–15)
BUN: 42 mg/dL — ABNORMAL HIGH (ref 6–20)
CO2: 36 mmol/L — ABNORMAL HIGH (ref 22–32)
Calcium: 9.5 mg/dL (ref 8.9–10.3)
Chloride: 87 mmol/L — ABNORMAL LOW (ref 101–111)
Creatinine, Ser: 1.3 mg/dL — ABNORMAL HIGH (ref 0.44–1.00)
GFR, EST AFRICAN AMERICAN: 49 mL/min — AB (ref >60)
GFR, EST NON AFRICAN AMERICAN: 42 mL/min — AB (ref >60)
GLUCOSE: 105 mg/dL — AB (ref 65–99)
POTASSIUM: 3.3 mmol/L — AB (ref 3.5–5.1)
Sodium: 133 mmol/L — ABNORMAL LOW (ref 135–145)

## 2016-10-23 MED ORDER — ACETAZOLAMIDE 250 MG PO TABS
500.0000 mg | ORAL_TABLET | Freq: Two times a day (BID) | ORAL | Status: DC
  Administered 2016-10-23 – 2016-10-30 (×15): 500 mg via ORAL
  Filled 2016-10-23 (×16): qty 2

## 2016-10-23 MED ORDER — POTASSIUM CHLORIDE CRYS ER 20 MEQ PO TBCR
40.0000 meq | EXTENDED_RELEASE_TABLET | Freq: Once | ORAL | Status: AC
  Administered 2016-10-23: 40 meq via ORAL
  Filled 2016-10-23: qty 2

## 2016-10-23 NOTE — Progress Notes (Signed)
Advanced Heart Failure Rounding Note  PCP: Arnette FeltsMike Duran  HF: Dr. Gala RomneyBensimhon   Subjective:    Admitted from clinic 10/16/16 with marked volume overload in setting of dietary indiscretion despite increase in diuretics.   Coox 68% this am. CVP 23 Remains on lasix drip 30 mg per hour + metolazone 5 mg bid + milrionone 0.25 mcg.    Complaining of leg pain. Denies SOB  RHC 2/17 RA 25 RV 92/26 PA 86/34 (56) PCWP 37 Fick 5.8/2.4 PVR 3.3   Objective:   Weight Range: 251 lb (113.9 kg) Body mass index is 40.51 kg/m.   Vital Signs:   Temp:  [98 F (36.7 C)-98.3 F (36.8 C)] 98.1 F (36.7 C) (12/13 0814) Pulse Rate:  [68-82] 76 (12/13 0814) Resp:  [16-19] 18 (12/13 0411) BP: (96-112)/(55-71) 96/55 (12/13 0814) SpO2:  [95 %-99 %] 95 % (12/13 0814) Weight:  [251 lb (113.9 kg)] 251 lb (113.9 kg) (12/13 0411) Last BM Date: 10/23/16  Weight change: Filed Weights   10/22/16 0418 10/22/16 0818 10/23/16 0411  Weight: 252 lb 1.6 oz (114.4 kg) 251 lb 9.6 oz (114.1 kg) 251 lb (113.9 kg)    Intake/Output:   Intake/Output Summary (Last 24 hours) at 10/23/16 0831 Last data filed at 10/23/16 0818  Gross per 24 hour  Intake          1778.16 ml  Output             2500 ml  Net          -721.84 ml     Physical Exam: CVP 23 General: Sitting in the chair.  NAD.  HEENT: Normal Neck: JVP remains elevated to ear. Carotids 2+ bilat; no bruits. No thyromegaly or nodule noted.  Cor: PMI nondisplaced. Distant, Slightly irregular. No M/G/R noted.  Lungs: Decrease bibasilar sounds.  Abdomen: Morbidly obese, soft, NT, ND, no HSM. No bruits or masses. +BS  Extremities: no cyanosis, clubbing, rash. R and LLE unna boots.  LUE PICC  Neuro: alert & orientedx3, cranial nerves grossly intact. moves all 4 extremities w/o difficulty. Affect pleasant  Tele: NSR  PVCs.    Labs: CBC No results for input(s): WBC, NEUTROABS, HGB, HCT, MCV, PLT in the last 72 hours. Basic Metabolic Panel  Recent  Labs  16/08/9611/12/17 0800 10/23/16 0435  NA 132* 133*  K 3.0* 3.3*  CL 87* 87*  CO2 42* 36*  GLUCOSE 223* 105*  BUN 40* 42*  CREATININE 1.27* 1.30*  CALCIUM 9.7 9.5  MG  --  2.1   Liver Function Tests No results for input(s): AST, ALT, ALKPHOS, BILITOT, PROT, ALBUMIN in the last 72 hours. No results for input(s): LIPASE, AMYLASE in the last 72 hours. Cardiac Enzymes No results for input(s): CKTOTAL, CKMB, CKMBINDEX, TROPONINI in the last 72 hours.  BNP: BNP (last 3 results)  Recent Labs  08/25/16 2119 09/26/16 1442 10/16/16 1512  BNP 1,359.9* 1,058.8* 903.7*    ProBNP (last 3 results) No results for input(s): PROBNP in the last 8760 hours.   D-Dimer No results for input(s): DDIMER in the last 72 hours. Hemoglobin A1C No results for input(s): HGBA1C in the last 72 hours. Fasting Lipid Panel No results for input(s): CHOL, HDL, LDLCALC, TRIG, CHOLHDL, LDLDIRECT in the last 72 hours. Thyroid Function Tests No results for input(s): TSH, T4TOTAL, T3FREE, THYROIDAB in the last 72 hours.  Invalid input(s): FREET3  Other results:     Imaging/Studies:  No results found.  Latest Echo  Latest  Cath   Medications:     Scheduled Medications: . aspirin EC  81 mg Oral Daily  . enoxaparin (LOVENOX) injection  60 mg Subcutaneous Q24H  . gabapentin  300 mg Oral TID  . insulin aspart  0-15 Units Subcutaneous TID WC  . insulin glargine  18 Units Subcutaneous Daily  . metolazone  5 mg Oral BID  . potassium chloride  40 mEq Oral TID  . sodium chloride flush  3 mL Intravenous Q12H  . spironolactone  25 mg Oral Daily    Infusions: . furosemide (LASIX) infusion 30 mg/hr (10/23/16 0225)  . milrinone 0.25 mcg/kg/min (10/23/16 0006)    PRN Medications: sodium chloride, acetaminophen, ondansetron (ZOFRAN) IV, sodium chloride flush   Assessment/Plan   1. 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 - [facility]- EF 30-35% RV normal.  - Echo on 12/7 with poor imaged LVEF probably in 35-40% range but severe RV failure and PAH - Continue lasix gtt, metolazone, and milrinone as above.  CVP 23. CO-OX marginal 68%. Supplement potassium.  - Continue current regimen and diamox.  - Currently off ARNI and spiro due to recent AKI and hyperkalemia.Marland Kitchen.  - Intolerant bidil with difficulty with headaches, dizziness and hypotension.  - no b-blocker with need for inotropes. - Low voltage QRS- SPEP no M spike.  UPEP.   Add unna boots.  2. CAD s/p NSTEMI and CABG 10/12  - Severe native CAD with patent grafts on LHC 12/13/15. No CP - Continue ASA and statin.   3. Morbid obesity 4. Pulmonary HTN - mild to mod RHC 12/13/15. PVR 3.24.  5. CKD stage III 6. DM2  - SSI while in house.    Length of Stay: 7  [name], NP  10/23/2016, 8:31 AM  Advanced Heart Failure Team Pager [phone] (M-F; 7a - 4p)  Please contact [facility] for night-coverage after hours (4p -7a ) and weekends on [email or web address]  Patient seen and examined with Tonye BecketAmy Clegg, NP. We discussed all aspects of the encounter. I agree with the assessment and plan as stated above.   She remains markedly volume overloaded. She is diuresing well but CVP remains high. Renal function stable. Co-ox ok on milrinone. I suspect she is taking a lot of volume in. Long talk about need for fluid restriction. Supp K+.   Bensimhon, Daniel,MD 10:09 AM

## 2016-10-24 LAB — BASIC METABOLIC PANEL
Anion gap: 11 (ref 5–15)
BUN: 42 mg/dL — ABNORMAL HIGH (ref 6–20)
CALCIUM: 9.4 mg/dL (ref 8.9–10.3)
CO2: 37 mmol/L — ABNORMAL HIGH (ref 22–32)
CREATININE: 1.46 mg/dL — AB (ref 0.44–1.00)
Chloride: 85 mmol/L — ABNORMAL LOW (ref 101–111)
GFR calc non Af Amer: 37 mL/min — ABNORMAL LOW (ref >60)
GFR, EST AFRICAN AMERICAN: 42 mL/min — AB (ref >60)
Glucose, Bld: 139 mg/dL — ABNORMAL HIGH (ref 65–99)
Potassium: 3.1 mmol/L — ABNORMAL LOW (ref 3.5–5.1)
Sodium: 133 mmol/L — ABNORMAL LOW (ref 135–145)

## 2016-10-24 LAB — COOXEMETRY PANEL
CARBOXYHEMOGLOBIN: 1.8 % — AB (ref 0.5–1.5)
Methemoglobin: 1 % (ref 0.0–1.5)
O2 SAT: 70.1 %
Total hemoglobin: 10.8 g/dL — ABNORMAL LOW (ref 12.0–16.0)

## 2016-10-24 LAB — GLUCOSE, CAPILLARY
GLUCOSE-CAPILLARY: 147 mg/dL — AB (ref 65–99)
GLUCOSE-CAPILLARY: 181 mg/dL — AB (ref 65–99)
GLUCOSE-CAPILLARY: 157 mg/dL — AB (ref 65–99)
Glucose-Capillary: 164 mg/dL — ABNORMAL HIGH (ref 65–99)

## 2016-10-24 LAB — CBC
HCT: 35.4 % — ABNORMAL LOW (ref 36.0–46.0)
Hemoglobin: 11.4 g/dL — ABNORMAL LOW (ref 12.0–15.0)
MCH: 30.2 pg (ref 26.0–34.0)
MCHC: 32.2 g/dL (ref 30.0–36.0)
MCV: 93.7 fL (ref 78.0–100.0)
PLATELETS: 228 10*3/uL (ref 150–400)
RBC: 3.78 MIL/uL — AB (ref 3.87–5.11)
RDW: 15.5 % (ref 11.5–15.5)
WBC: 4.6 10*3/uL (ref 4.0–10.5)

## 2016-10-24 MED ORDER — POTASSIUM CHLORIDE CRYS ER 20 MEQ PO TBCR: 40.0000 meq | EXTENDED_RELEASE_TABLET | Freq: Two times a day (BID) | ORAL | Status: DC

## 2016-10-24 MED ORDER — POTASSIUM CHLORIDE CRYS ER 20 MEQ PO TBCR
40.0000 meq | EXTENDED_RELEASE_TABLET | Freq: Four times a day (QID) | ORAL | Status: DC
  Administered 2016-10-24 – 2016-10-30 (×24): 40 meq via ORAL
  Filled 2016-10-24 (×25): qty 2

## 2016-10-24 MED ORDER — POTASSIUM CHLORIDE CRYS ER 20 MEQ PO TBCR
40.0000 meq | EXTENDED_RELEASE_TABLET | Freq: Once | ORAL | Status: AC
  Administered 2016-10-24: 40 meq via ORAL
  Filled 2016-10-24: qty 2

## 2016-10-24 NOTE — Progress Notes (Signed)
Advanced Heart Failure Rounding Note  PCP: Arnette FeltsMike Duran  HF: Dr. Gala RomneyBensimhon   Subjective:    Admitted from clinic 10/16/16 with marked volume overload in setting of dietary indiscretion despite increase in diuretics.   Coox 70% this am. Remains on lasix drip 30 mg per hour + metolazone 5 mg bid + milrionone 0.25 mcg + diamox. Brisk diuresis noted negative 6.8 liters. Weight down 7 pounds. Overall weight down  20 pounds.   Feeling better.  Denies SOB  RHC 2/17 RA 25 RV 92/26 PA 86/34 (56) PCWP 37 Fick 5.8/2.4 PVR 3.3   Objective:   Weight Range: 244 lb 8 oz (110.9 kg) Body mass index is 39.46 kg/m.   Vital Signs:   Temp:  [98 F (36.7 C)-98.6 F (37 C)] 98 F (36.7 C) (12/14 0801) Pulse Rate:  [73-78] 74 (12/14 0801) Resp:  [16] 16 (12/14 0423) BP: (96-113)/(53-72) 113/72 (12/14 0801) SpO2:  [95 %-99 %] 98 % (12/14 0801) Weight:  [244 lb 8 oz (110.9 kg)] 244 lb 8 oz (110.9 kg) (12/14 0423) Last BM Date: 10/23/16  Weight change: Filed Weights   10/22/16 0818 10/23/16 0411 10/24/16 0423  Weight: 251 lb 9.6 oz (114.1 kg) 251 lb (113.9 kg) 244 lb 8 oz (110.9 kg)    Intake/Output:   Intake/Output Summary (Last 24 hours) at 10/24/16 0805 Last data filed at 10/24/16 0802  Gross per 24 hour  Intake                0 ml  Output             8600 ml  Net            -8600 ml     Physical Exam: CVP 17 General: Sitting in the chair.  NAD.  HEENT: Normal Neck: JVP remains elevated to ear. Carotids 2+ bilat; no bruits. No thyromegaly or nodule noted.  Cor: PMI nondisplaced. Distant, Slightly irregular. No M/G/R noted.  Lungs: Decrease bibasilar sounds.  Abdomen: Morbidly obese, soft, NT, ND, no HSM. No bruits or masses. +BS  Extremities: no cyanosis, clubbing, rash. R and LLE unna boots.  LUE PICC  Neuro: alert & orientedx3, cranial nerves grossly intact. moves all 4 extremities w/o difficulty. Affect pleasant  Tele: NSR  PVCs.    Labs: CBC  Recent Labs  10/24/16 0524  WBC 4.6  HGB 11.4*  HCT 35.4*  MCV 93.7  PLT 228   Basic Metabolic Panel  Recent Labs  10/23/16 0435 10/24/16 0524  NA 133* 133*  K 3.3* 3.1*  CL 87* 85*  CO2 36* 37*  GLUCOSE 105* 139*  BUN 42* 42*  CREATININE 1.30* 1.46*  CALCIUM 9.5 9.4  MG 2.1  --    Liver Function Tests No results for input(s): AST, ALT, ALKPHOS, BILITOT, PROT, ALBUMIN in the last 72 hours. No results for input(s): LIPASE, AMYLASE in the last 72 hours. Cardiac Enzymes No results for input(s): CKTOTAL, CKMB, CKMBINDEX, TROPONINI in the last 72 hours.  BNP: BNP (last 3 results)  Recent Labs  08/25/16 2119 09/26/16 1442 10/16/16 1512  BNP 1,359.9* 1,058.8* 903.7*    ProBNP (last 3 results) No results for input(s): PROBNP in the last 8760 hours.   D-Dimer No results for input(s): DDIMER in the last 72 hours. Hemoglobin A1C No results for input(s): HGBA1C in the last 72 hours. Fasting Lipid Panel No results for input(s): CHOL, HDL, LDLCALC, TRIG, CHOLHDL, LDLDIRECT in the last 72 hours. Thyroid Function  Tests No results for input(s): TSH, T4TOTAL, T3FREE, THYROIDAB in the last 72 hours.  Invalid input(s): FREET3  Other results:     Imaging/Studies:  No results found.  Latest Echo  Latest Cath   Medications:     Scheduled Medications: . acetaZOLAMIDE  500 mg Oral BID  . aspirin EC  81 mg Oral Daily  . enoxaparin (LOVENOX) injection  60 mg Subcutaneous Q24H  . gabapentin  300 mg Oral TID  . insulin aspart  0-15 Units Subcutaneous TID WC  . insulin glargine  18 Units Subcutaneous Daily  . metolazone  5 mg Oral BID  . potassium chloride  40 mEq Oral TID  . sodium chloride flush  3 mL Intravenous Q12H  . spironolactone  25 mg Oral Daily    Infusions: . furosemide (LASIX) infusion 30 mg/hr (10/23/16 2259)  . milrinone 0.25 mcg/kg/min (10/24/16 0304)    PRN Medications: sodium chloride, acetaminophen, ondansetron (ZOFRAN) IV, sodium chloride  flush   Assessment/Plan   1. 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 - [facility]- EF 30-35% RV normal.  - Echo on 12/7 with poor imaged LVEF probably in 35-40% range but severe RV failure and PAH - Brisk diuresis noted. Continue lasix gtt, metolazone, diamox,  and milrinone as above.   - Currently off ARNI and spiro due to recent AKI and hyperkalemia.Marland Kitchen.  - Intolerant bidil with difficulty with headaches, dizziness and hypotension.  - no b-blocker with need for inotropes. - Low voltage QRS- SPEP no M spike.  UPEP.   -Continue unna boots.  2. CAD s/p NSTEMI and CABG 10/12  - Severe native CAD with patent grafts on LHC 12/13/15. No CP - Continue ASA and statin.   3. Morbid obesity 4. Pulmonary HTN - mild to mod RHC 12/13/15. PVR 3.24.  5. CKD stage III 6. DM2  - SSI while in house.  [facility]. Also refer to paramedicine.     Length of Stay: 8  [name], NP  10/24/2016, 8:05 AM  Advanced Heart Failure Team Pager [phone] (M-F; 7a - 4p)  Please contact [facility] for night-coverage after hours (4p -7a ) and weekends on [email or web address]  Patient seen and examined with Tonye BecketAmy Clegg, NP. We discussed all aspects of the encounter. I agree with the assessment and plan as stated above.   Volume status starting to improve with milrinone, high-dose lasix gtt, metolazone, and diamox. Creatinine up slightly. Supp K. Continue current regimen.   Garrison Michie,MD 9:48 AM

## 2016-10-24 NOTE — Progress Notes (Signed)
CARDIAC REHAB PHASE I   PRE:  Rate/Rhythm: 86 SR c/ PVCs  BP:  Sitting: 103/63        SaO2: 95 RA  MODE:  Ambulation: 260 ft   POST:  Rate/Rhythm: 96 SR c/ PVCs  BP:  Sitting: 103/62         SaO2: 100 RA  Pt ambulated 260 ft on RA, IV, assist x1, steady gait, tolerated well. Pt c/o very mild DOE, mostly c/o leg/ankle pain from arthritis, declined rest stop. Pt appreciative of walk. Pt to recliner after walk, call bell within reach. Will follow.   [id number] Joylene GrapesEmily C Roque Schill, RN, BSN 10/24/2016 12:04 PM

## 2016-10-24 NOTE — Care Management Important Message (Signed)
Important Message  Patient Details  Name: Christine Knight MRN: [id number] Date of Birth: [birth year]   Medicare Important Message Given:  Yes    Kyla BalzarineShealy, Yeraldi Fidler Abena 10/24/2016, 9:54 AM

## 2016-10-25 ENCOUNTER — Encounter (HOSPITAL_COMMUNITY): Payer: Medicare Other

## 2016-10-25 DIAGNOSIS — N179 Acute kidney failure, unspecified: Secondary | ICD-10-CM

## 2016-10-25 DIAGNOSIS — I509 Heart failure, unspecified: Secondary | ICD-10-CM

## 2016-10-25 LAB — COOXEMETRY PANEL
Carboxyhemoglobin: 1.7 % — ABNORMAL HIGH (ref 0.5–1.5)
Methemoglobin: 1 % (ref 0.0–1.5)
O2 Saturation: 64.4 %
Total hemoglobin: 12.2 g/dL (ref 12.0–16.0)

## 2016-10-25 LAB — GLUCOSE, CAPILLARY
GLUCOSE-CAPILLARY: 145 mg/dL — AB (ref 65–99)
GLUCOSE-CAPILLARY: 178 mg/dL — AB (ref 65–99)
GLUCOSE-CAPILLARY: 195 mg/dL — AB (ref 65–99)
Glucose-Capillary: 244 mg/dL — ABNORMAL HIGH (ref 65–99)

## 2016-10-25 LAB — BASIC METABOLIC PANEL
ANION GAP: 9 (ref 5–15)
BUN: 47 mg/dL — ABNORMAL HIGH (ref 6–20)
CALCIUM: 9.5 mg/dL (ref 8.9–10.3)
CHLORIDE: 84 mmol/L — AB (ref 101–111)
CO2: 36 mmol/L — AB (ref 22–32)
Creatinine, Ser: 1.65 mg/dL — ABNORMAL HIGH (ref 0.44–1.00)
GFR calc non Af Amer: 32 mL/min — ABNORMAL LOW (ref >60)
GFR, EST AFRICAN AMERICAN: 37 mL/min — AB (ref >60)
Glucose, Bld: 171 mg/dL — ABNORMAL HIGH (ref 65–99)
Potassium: 3.3 mmol/L — ABNORMAL LOW (ref 3.5–5.1)
SODIUM: 129 mmol/L — AB (ref 135–145)

## 2016-10-25 MED ORDER — POTASSIUM CHLORIDE CRYS ER 20 MEQ PO TBCR
20.0000 meq | EXTENDED_RELEASE_TABLET | Freq: Once | ORAL | Status: AC
  Administered 2016-10-25: 20 meq via ORAL

## 2016-10-25 NOTE — Progress Notes (Signed)
CVP this am: 16

## 2016-10-25 NOTE — Progress Notes (Signed)
CARDIAC REHAB PHASE I   PRE:  Rate/Rhythm: 70 SR PVCs  BP:  Supine:   Sitting: 94/76  Standing:    SaO2: 100%RA  MODE:  Ambulation: 460 ft   POST:  Rate/Rhythm: 93 SR couplet PVCs ad some PACS  BP:  Supine:   Sitting: 115/66  Standing:    SaO2: 90%RA 1058-1120 Pt walked 460 ft on RA pushing IV pole. Stopped once to rest due to legs bothering her. Denied SOB. To chair after walk. Tolerated walk well.    Luetta [name] Geddy [place], RN BSN  10/25/2016 11:14 AM

## 2016-10-25 NOTE — Progress Notes (Signed)
Advanced Heart Failure Rounding Note  PCP: Arnette FeltsMike Duran  HF: Dr. Gala RomneyBensimhon   Subjective:    Admitted from clinic 10/16/16 with marked volume overload in setting of dietary indiscretion despite increase in diuretics.   Coox 64% this am. Remains on lasix drip 30 mg per hour + metolazone 5 mg bid + milrionone 0.25 mcg + diamox. Brisk diuresis noted negative 6.0 liters. Weight down 7 pounds. Overall weight down  25 pounds. Creatinine trending up from 1.46>1.65.   Able to ambulate in the hall. Denies SOB.   RHC 2/17 RA 25 RV 92/26 PA 86/34 (56) PCWP 37 Fick 5.8/2.4 PVR 3.3   Objective:   Weight Range: 239 lb (108.4 kg) Body mass index is 38.58 kg/m.   Vital Signs:   Temp:  [98.1 F (36.7 C)-98.8 F (37.1 C)] 98.8 F (37.1 C) (12/15 1124) Pulse Rate:  [62-72] 67 (12/15 0351) Resp:  [16-19] 19 (12/15 0351) BP: (88-103)/(50-66) 92/54 (12/15 0351) SpO2:  [90 %-99 %] 97 % (12/15 1124) Weight:  [239 lb (108.4 kg)] 239 lb (108.4 kg) (12/15 0351) Last BM Date: 10/24/16  Weight change: Filed Weights   10/23/16 0411 10/24/16 0423 10/25/16 0351  Weight: 251 lb (113.9 kg) 244 lb 8 oz (110.9 kg) 239 lb (108.4 kg)    Intake/Output:   Intake/Output Summary (Last 24 hours) at 10/25/16 1130 Last data filed at 10/25/16 0700  Gross per 24 hour  Intake              360 ml  Output             4850 ml  Net            -4490 ml     Physical Exam: CVP 14 General: Sitting in the chair.  NAD.  HEENT: Normal Neck: JVP remains elevated to ear. Carotids 2+ bilat; no bruits. No thyromegaly or nodule noted.  Cor: PMI nondisplaced. Distant, Slightly irregular. No M/G/R noted.  Lungs: Decrease bibasilar sounds.  Abdomen: Morbidly obese, soft, NT, ND, no HSM. No bruits or masses. +BS  Extremities: no cyanosis, clubbing, rash. R and LLE unna boots.  LUE PICC  Neuro: alert & orientedx3, cranial nerves grossly intact. moves all 4 extremities w/o difficulty. Affect pleasant  Tele: NSR   PVCs.    Labs: CBC  Recent Labs  10/24/16 0524  WBC 4.6  HGB 11.4*  HCT 35.4*  MCV 93.7  PLT 228   Basic Metabolic Panel  Recent Labs  10/23/16 0435 10/24/16 0524 10/25/16 0530  NA 133* 133* 129*  K 3.3* 3.1* 3.3*  CL 87* 85* 84*  CO2 36* 37* 36*  GLUCOSE 105* 139* 171*  BUN 42* 42* 47*  CREATININE 1.30* 1.46* 1.65*  CALCIUM 9.5 9.4 9.5  MG 2.1  --   --    Liver Function Tests No results for input(s): AST, ALT, ALKPHOS, BILITOT, PROT, ALBUMIN in the last 72 hours. No results for input(s): LIPASE, AMYLASE in the last 72 hours. Cardiac Enzymes No results for input(s): CKTOTAL, CKMB, CKMBINDEX, TROPONINI in the last 72 hours.  BNP: BNP (last 3 results)  Recent Labs  08/25/16 2119 09/26/16 1442 10/16/16 1512  BNP 1,359.9* 1,058.8* 903.7*    ProBNP (last 3 results) No results for input(s): PROBNP in the last 8760 hours.   D-Dimer No results for input(s): DDIMER in the last 72 hours. Hemoglobin A1C No results for input(s): HGBA1C in the last 72 hours. Fasting Lipid Panel No results for input(s):  CHOL, HDL, LDLCALC, TRIG, CHOLHDL, LDLDIRECT in the last 72 hours. Thyroid Function Tests No results for input(s): TSH, T4TOTAL, T3FREE, THYROIDAB in the last 72 hours.  Invalid input(s): FREET3  Other results:     Imaging/Studies:  No results found.  Latest Echo  Latest Cath   Medications:     Scheduled Medications: . acetaZOLAMIDE  500 mg Oral BID  . aspirin EC  81 mg Oral Daily  . enoxaparin (LOVENOX) injection  60 mg Subcutaneous Q24H  . gabapentin  300 mg Oral TID  . insulin aspart  0-15 Units Subcutaneous TID WC  . insulin glargine  18 Units Subcutaneous Daily  . metolazone  5 mg Oral BID  . potassium chloride  40 mEq Oral QID  . sodium chloride flush  3 mL Intravenous Q12H  . spironolactone  25 mg Oral Daily    Infusions: . furosemide (LASIX) infusion 30 mg/hr (10/25/16 0329)  . milrinone 0.25 mcg/kg/min (10/25/16 0308)    PRN  Medications: sodium chloride, acetaminophen, ondansetron (ZOFRAN) IV, sodium chloride flush   Assessment/Plan   1. 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 - [facility]- EF 30-35% RV normal.  - Echo on 12/7 with poor imaged LVEF probably in 35-40% range but severe RV failure and PAH - Brisk diuresis noted. Continue lasix gtt, metolazone, diamox,  and milrinone as above.  Negative 6 liters.  - Currently off ARNI and spiro due to recent AKI and hyperkalemia.Marland Kitchen.  - Intolerant bidil with difficulty with headaches, dizziness and hypotension.  - no b-blocker with need for inotropes. - Low voltage QRS- SPEP no M spike.  UPEP.   -Continue unna boots.  2. CAD s/p NSTEMI and CABG 10/12  - Severe native CAD with patent grafts on [facility] 12/13/15. No CP - Continue ASA and statin.   3. Morbid obesity 4. Pulmonary HTN - mild to mod RHC 12/13/15. PVR 3.24.  5. Acute on CKD stage III 6. DM2  - SSI while in house. 7. Hypokalemia-Supplement K.   [facility] Also refer to paramedicine.     Length of Stay: 9  [name], NP  10/25/2016, 11:30 AM  Advanced Heart Failure Team Pager [phone] (M-F; 7a - 4p)  Please contact [facility] for night-coverage after hours (4p -7a ) and weekends on [email or web address]  Patient seen and examined with Tonye BecketAmy Clegg, NP. We discussed all aspects of the encounter. I agree with the assessment and plan as stated above.   Volume status improving with milrinone, high-dose lasix gtt, metolazone, and diamox in setting of advanced RV failure. Creatinine continues to go up slightly. Supp K. Continue current regimen. Previous discharge weight 224 in 10/17 so still a bit to go. Hopefully renal function will tolerate.   Opie Fanton,MD 3:21 PM

## 2016-10-26 LAB — GLUCOSE, CAPILLARY
GLUCOSE-CAPILLARY: 204 mg/dL — AB (ref 65–99)
GLUCOSE-CAPILLARY: 178 mg/dL — AB (ref 65–99)
Glucose-Capillary: 184 mg/dL — ABNORMAL HIGH (ref 65–99)
Glucose-Capillary: 177 mg/dL — ABNORMAL HIGH (ref 65–99)

## 2016-10-26 LAB — BASIC METABOLIC PANEL
Anion gap: 11 (ref 5–15)
BUN: 51 mg/dL — ABNORMAL HIGH (ref 6–20)
CALCIUM: 9.3 mg/dL (ref 8.9–10.3)
CHLORIDE: 83 mmol/L — AB (ref 101–111)
CO2: 36 mmol/L — AB (ref 22–32)
CREATININE: 1.68 mg/dL — AB (ref 0.44–1.00)
GFR calc non Af Amer: 31 mL/min — ABNORMAL LOW (ref >60)
GFR, EST AFRICAN AMERICAN: 36 mL/min — AB (ref >60)
GLUCOSE: 233 mg/dL — AB (ref 65–99)
Potassium: 3.4 mmol/L — ABNORMAL LOW (ref 3.5–5.1)
Sodium: 130 mmol/L — ABNORMAL LOW (ref 135–145)

## 2016-10-26 LAB — COOXEMETRY PANEL
CARBOXYHEMOGLOBIN: 1.6 % — AB (ref 0.5–1.5)
Methemoglobin: 1 % (ref 0.0–1.5)
O2 SAT: 67.1 %
Total hemoglobin: 11.6 g/dL — ABNORMAL LOW (ref 12.0–16.0)

## 2016-10-26 MED ORDER — ENOXAPARIN SODIUM 40 MG/0.4ML ~~LOC~~ SOLN
40.0000 mg | SUBCUTANEOUS | Status: DC
  Filled 2016-10-26 (×5): qty 0.4

## 2016-10-26 NOTE — Progress Notes (Addendum)
Subjective:  Complains of some dizziness.  Says her breathing is better.  No chest pain.  Co-ox  is 67% this morning.  Continues to diurese significantly and weight is now down 11 pounds.  Mildly hypokalemic.  Creatinine mildly up.  Objective:  Vital Signs in the last 24 hours: BP (!) 102/55 (BP Location: Right Arm)   Pulse 70   Temp 97.5 F (36.4 C) (Oral)   Resp (!) 21   Ht 5\' 6"  (1.676 m)   Wt 105.7 kg (233 lb)   SpO2 96%   BMI 37.61 kg/m   Physical Exam: Obese black female sitting in chair in no acute distress Lungs:  Reduced breath sounds Cardiac:  Irregular rhythm, normal S1 and S2, no S3, JVD is elevated Abdomen:  Soft, nontender, no masses Extremities:  Unna boots present with mild edema above them  Intake/Output from previous day: 12/15 0701 - 12/16 0700 In: 1466.4 [P.O.:540; I.V.:926.4] Out: 5225 [Urine:5225] Weight Filed Weights   10/24/16 0423 10/25/16 0351 10/26/16 0445  Weight: 110.9 kg (244 lb 8 oz) 108.4 kg (239 lb) 105.7 kg (233 lb)    Lab Results: Basic Metabolic Panel:  Recent Labs  13/06/6511/15/17 0530 10/26/16 0458  NA 129* 130*  K 3.3* 3.4*  CL 84* 83*  CO2 36* 36*  GLUCOSE 171* 233*  BUN 47* 51*  CREATININE 1.65* 1.68*    CBC:  Recent Labs  10/24/16 0524  WBC 4.6  HGB 11.4*  HCT 35.4*  MCV 93.7  PLT 228    BNP    Component Value Date/Time   BNP 903.7 (H) 10/16/2016 1512   Telemetry: Rhythm is difficult to interpret.  Appears to be atrial fibrillation but occasionally will see what looks like a sinus beat, frequent PACs as well as PVCs noted.  Assessment/Plan:  1.  Acute on chronic systolic biventricular heart failure due to ischemic cardiomyopathy some improvement 2.  CAD with previous bypass grafting 3.  Obesity 4.  Pulmonary hypertension 5.  Chronic kidney disease mild worsening  Recommendations:  Replete potassium and continue diuresis.  May need to back off some tomorrow.      Darden PalmerW. Spencer Tilley, Jr.  MD  [facility]iology  10/26/2016, 1:04 PM

## 2016-10-26 NOTE — Progress Notes (Signed)
CARDIAC REHAB PHASE I   PRE:  Rate/Rhythm: 82 SR w/ PVCs  BP:  Supine:   Sitting: 104/58  Standing:    SaO2: 97% RA  MODE:  Ambulation: 500 ft   POST:  Rate/Rhythm: 83 SR w/ PVCs  BP:  Supine:   Sitting: 108/63  Standing:    SaO2: 93% RA  [id number] Patient tolerated ambulation well with assist x1. No c/o, to chair after walk, then to bathroom, VSS. Encouraged to walk again with nursing staff.  Artist Paislinty M Braedon Sjogren, MS, ACSM CEP

## 2016-10-27 LAB — COOXEMETRY PANEL
CARBOXYHEMOGLOBIN: 1.7 % — AB (ref 0.5–1.5)
METHEMOGLOBIN: 0.9 % (ref 0.0–1.5)
O2 SAT: 75.8 %
TOTAL HEMOGLOBIN: 11.9 g/dL — AB (ref 12.0–16.0)

## 2016-10-27 LAB — BASIC METABOLIC PANEL
Anion gap: 12 (ref 5–15)
BUN: 55 mg/dL — AB (ref 6–20)
CO2: 37 mmol/L — ABNORMAL HIGH (ref 22–32)
Calcium: 9.6 mg/dL (ref 8.9–10.3)
Chloride: 83 mmol/L — ABNORMAL LOW (ref 101–111)
Creatinine, Ser: 1.77 mg/dL — ABNORMAL HIGH (ref 0.44–1.00)
GFR calc Af Amer: 34 mL/min — ABNORMAL LOW (ref >60)
GFR, EST NON AFRICAN AMERICAN: 29 mL/min — AB (ref >60)
GLUCOSE: 193 mg/dL — AB (ref 65–99)
POTASSIUM: 3.3 mmol/L — AB (ref 3.5–5.1)
Sodium: 132 mmol/L — ABNORMAL LOW (ref 135–145)

## 2016-10-27 LAB — GLUCOSE, CAPILLARY
Glucose-Capillary: 192 mg/dL — ABNORMAL HIGH (ref 65–99)
Glucose-Capillary: 202 mg/dL — ABNORMAL HIGH (ref 65–99)
Glucose-Capillary: 230 mg/dL — ABNORMAL HIGH (ref 65–99)
Glucose-Capillary: 187 mg/dL — ABNORMAL HIGH (ref 65–99)

## 2016-10-27 NOTE — Progress Notes (Signed)
  RN getting ranges from 18-35 for pt's CVP pressure. RN called to get lines changed out. Will recheck when lines changed & continue to monitor the pt. Sanda LingerMilam, Sharif Rendell R, RN

## 2016-10-27 NOTE — Progress Notes (Signed)
Subjective:   Breathing is much better.  Less dizziness today.  No chest pain.  Co-ox  is 75% this morning.  Continues to diurese significantly and weight is now down to 228 from 262..  Mildly hypokalemic.  Creatinine mildly up again today.  Objective:  Vital Signs in the last 24 hours: BP 103/63 (BP Location: Right Arm)   Pulse 68   Temp 97.7 F (36.5 C) (Oral)   Resp 19   Ht 5\' 6"  (1.676 m)   Wt 103.6 kg (228 lb 8 oz)   SpO2 94%   BMI 36.88 kg/m   Physical Exam: Obese black female sitting in chair in no acute distress Lungs:  Reduced breath sounds Cardiac:  Irregular rhythm, normal S1 and S2, no S3, JVD is elevated Abdomen:  Soft, nontender, no masses Extremities:  Still 3+ edema.    Intake/Output from previous day: 12/16 0701 - 12/17 0700 In: 1423.2 [P.O.:900; I.V.:523.2] Out: 4050 [Urine:4050] Weight Filed Weights   10/25/16 0351 10/26/16 0445 10/27/16 0413  Weight: 108.4 kg (239 lb) 105.7 kg (233 lb) 103.6 kg (228 lb 8 oz)    Lab Results: Basic Metabolic Panel:  Recent Labs  14/78/2912/16/17 0458 10/27/16 0430  NA 130* 132*  K 3.4* 3.3*  CL 83* 83*  CO2 36* 37*  GLUCOSE 233* 193*  BUN 51* 55*  CREATININE 1.68* 1.77*    CBC:   BNP    Component Value Date/Time   BNP 903.7 (H) 10/16/2016 1512   Telemetry: Rhythm again difficult to interpret-looks like atrial fibrillation to me but question of sinus beats at times.  Assessment/Plan:  1.  Acute on chronic systolic biventricular heart failure due to ischemic cardiomyopathy some improvementWith significant weight loss 2.  CAD with previous bypass grafting 3.  Obesity 4.  Pulmonary hypertension 5.  Chronic kidney disease mild worsening again today.  Recommendations:  Replete potassium and continue diuresis.  Spoke to Dr. Shirlee LatchMcLean and will continue diuresis and current level with creatinine starting to go up may need to reduce again tomorrow.      Darden PalmerW. Spencer Griffey Nicasio, Jr.  MD [facility]  10/27/2016,  9:21 AM

## 2016-10-28 ENCOUNTER — Encounter (HOSPITAL_COMMUNITY): Payer: Medicare Other

## 2016-10-28 LAB — BASIC METABOLIC PANEL
Anion gap: 12 (ref 5–15)
BUN: 61 mg/dL — AB (ref 6–20)
CHLORIDE: 82 mmol/L — AB (ref 101–111)
CO2: 37 mmol/L — AB (ref 22–32)
Calcium: 9.4 mg/dL (ref 8.9–10.3)
Creatinine, Ser: 1.67 mg/dL — ABNORMAL HIGH (ref 0.44–1.00)
GFR calc Af Amer: 36 mL/min — ABNORMAL LOW (ref >60)
GFR calc non Af Amer: 31 mL/min — ABNORMAL LOW (ref >60)
GLUCOSE: 173 mg/dL — AB (ref 65–99)
POTASSIUM: 3.2 mmol/L — AB (ref 3.5–5.1)
Sodium: 131 mmol/L — ABNORMAL LOW (ref 135–145)

## 2016-10-28 LAB — GLUCOSE, CAPILLARY
GLUCOSE-CAPILLARY: 215 mg/dL — AB (ref 65–99)
Glucose-Capillary: 176 mg/dL — ABNORMAL HIGH (ref 65–99)
Glucose-Capillary: 150 mg/dL — ABNORMAL HIGH (ref 65–99)
Glucose-Capillary: 266 mg/dL — ABNORMAL HIGH (ref 65–99)

## 2016-10-28 LAB — MAGNESIUM: Magnesium: 2.7 mg/dL — ABNORMAL HIGH (ref 1.7–2.4)

## 2016-10-28 LAB — COOXEMETRY PANEL
Carboxyhemoglobin: 1.7 % — ABNORMAL HIGH (ref 0.5–1.5)
METHEMOGLOBIN: 0.9 % (ref 0.0–1.5)
O2 Saturation: 70.3 %
TOTAL HEMOGLOBIN: 12.1 g/dL (ref 12.0–16.0)

## 2016-10-28 MED ORDER — POTASSIUM CHLORIDE CRYS ER 20 MEQ PO TBCR
40.0000 meq | EXTENDED_RELEASE_TABLET | Freq: Once | ORAL | Status: AC
  Administered 2016-10-28: 40 meq via ORAL
  Filled 2016-10-28: qty 2

## 2016-10-28 NOTE — Plan of Care (Signed)
Problem: Safety: Goal: Ability to remain free from injury will improve Outcome: Progressing Fall risk bundle in place, call light within reach and pt demonstrated use, non-skid foot wear on, safety equipment in room, hourly rounding performed, will continue to monitor. Pt resting in chair and has no other needs or concerns at this time.

## 2016-10-28 NOTE — Progress Notes (Signed)
Orthopedic Tech Progress Note Patient Details:  Christine Knight 1951/09/23 [id number]  Ortho Devices Type of Ortho Device: Roland RackUnna boot Ortho Device/Splint Location: bilateral Ortho Device/Splint Interventions: Application   Aleja Yearwood 10/28/2016, 4:18 PM

## 2016-10-28 NOTE — Progress Notes (Signed)
CARDIAC REHAB PHASE I  Second attempt to ambulate with pt. Pt in recliner, resting, declines ambulation. Will follow up tomorrow.   Joylene GrapesEmily C Tabias Swayze, RN, BSN 10/28/2016 2:15 PM

## 2016-10-28 NOTE — Progress Notes (Signed)
CARDIAC REHAB PHASE I  Pt sitting up in chair, c/o dizziness, states she "feels awful." Pt declines ambulation at this time.  Pt assisted to elevate legs. Of note, pt does not have unna boots on at this time, pt states she removed them, RN notified. Pt in recliner, feet elevated, call bell within reach. Will follow up this afternoon as schedule permits.   Joylene GrapesEmily C Moriya Mitchell, RN, BSN 10/28/2016 11:40 AM

## 2016-10-28 NOTE — Progress Notes (Addendum)
Advanced Heart Failure Rounding Note  PCP: Arnette Felts  HF: Dr. Gala Romney   Subjective:    Admitted from clinic 10/16/16 with marked volume overload in setting of dietary indiscretion despite increase in diuretics.   Coox 70.3%.   Remains on lasix drip 30 mg per hour + metolazone 5 mg bid + milrionone 0.25 mcg + diamox 500 mg BID.  Out another 5.4 L and down another 4 lbs. Overall weight down 38 pounds. Creatinine 1.4 -> 1.6 -> 1.7 -> 1.6 Weight this am 224 lbs, which is her previous discharge weight (09/08/16)  Feeling run down today. Dizzy at times. CVP 12-13.   RHC 2/17 RA 25 RV 92/26 PA 86/34 (56) PCWP 37 Fick 5.8/2.4 PVR 3.3   Objective:   Weight Range: 224 lb 3.2 oz (101.7 kg) Body mass index is 36.19 kg/m.   Vital Signs:   Temp:  [97.8 F (36.6 C)-98.3 F (36.8 C)] 97.8 F (36.6 C) (12/18 0753) Pulse Rate:  [67-74] 69 (12/18 0753) Resp:  [13-20] 19 (12/18 0753) BP: (93-103)/(48-64) 97/50 (12/18 0827) SpO2:  [94 %-100 %] 100 % (12/18 0753) Weight:  [224 lb 3.2 oz (101.7 kg)] 224 lb 3.2 oz (101.7 kg) (12/18 0452) Last BM Date: 10/26/16 (per pt)  Weight change: Filed Weights   10/26/16 0445 10/27/16 0413 10/28/16 0452  Weight: 233 lb (105.7 kg) 228 lb 8 oz (103.6 kg) 224 lb 3.2 oz (101.7 kg)    Intake/Output:   Intake/Output Summary (Last 24 hours) at 10/28/16 1131 Last data filed at 10/28/16 0824  Gross per 24 hour  Intake              542 ml  Output             4150 ml  Net            -3608 ml     Physical Exam: CVP 12-13 General: Fatigued appearing. Seated in chair. NAD.   HEENT: Normal. Neck: JVP ~ 12 cm but difficult with girth. Carotids 2+ bilat; no bruits. No thyromegaly or nodule noted.  Cor: PMI nondisplaced. Distant, Regular. No M/G/R noted.   Lungs: Diminished basilar sounds.  Abdomen: Morbidly obese, soft, NT, ND, no HSM. No bruits or masses. +BS   Extremities: no cyanosis, clubbing, rash. R and LLE unna boots.  LUE PICC   Neuro: alert & orientedx3, cranial nerves grossly intact. moves all 4 extremities w/o difficulty. Affect pleasant  Tele: Reviewed, NSR, PVCs    Labs: CBC No results for input(s): WBC, NEUTROABS, HGB, HCT, MCV, PLT in the last 72 hours. Basic Metabolic Panel  Recent Labs  10/27/16 0430 10/28/16 0443  NA 132* 131*  K 3.3* 3.2*  CL 83* 82*  CO2 37* 37*  GLUCOSE 193* 173*  BUN 55* 61*  CREATININE 1.77* 1.67*  CALCIUM 9.6 9.4   Liver Function Tests No results for input(s): AST, ALT, ALKPHOS, BILITOT, PROT, ALBUMIN in the last 72 hours. No results for input(s): LIPASE, AMYLASE in the last 72 hours. Cardiac Enzymes No results for input(s): CKTOTAL, CKMB, CKMBINDEX, TROPONINI in the last 72 hours.  BNP: BNP (last 3 results)  Recent Labs  08/25/16 2119 09/26/16 1442 10/16/16 1512  BNP 1,359.9* 1,058.8* 903.7*    ProBNP (last 3 results) No results for input(s): PROBNP in the last 8760 hours.   D-Dimer No results for input(s): DDIMER in the last 72 hours. Hemoglobin A1C No results for input(s): HGBA1C in the last 72 hours. Fasting Lipid  Panel No results for input(s): CHOL, HDL, LDLCALC, TRIG, CHOLHDL, LDLDIRECT in the last 72 hours. Thyroid Function Tests No results for input(s): TSH, T4TOTAL, T3FREE, THYROIDAB in the last 72 hours.  Invalid input(s): FREET3  Other results:     Imaging/Studies:  No results found.  Latest Echo  Latest Cath   Medications:     Scheduled Medications: . acetaZOLAMIDE  500 mg Oral BID  . aspirin EC  81 mg Oral Daily  . enoxaparin (LOVENOX) injection  40 mg Subcutaneous Q24H  . gabapentin  300 mg Oral TID  . insulin aspart  0-15 Units Subcutaneous TID WC  . insulin glargine  18 Units Subcutaneous Daily  . metolazone  5 mg Oral BID  . potassium chloride  40 mEq Oral QID  . potassium chloride  40 mEq Oral Once  . sodium chloride flush  3 mL Intravenous Q12H  . spironolactone  25 mg Oral Daily    Infusions: .  furosemide (LASIX) infusion 30 mg/hr (10/28/16 0823)  . milrinone 0.25 mcg/kg/min (10/28/16 0824)    PRN Medications: sodium chloride, acetaminophen, ondansetron (ZOFRAN) IV, sodium chloride flush   Assessment/Plan   1. 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 - [facility]- EF 30-35% RV normal.  - Echo on 12/7 with poor imaged LVEF probably in 35-40% range but severe RV failure and PAH - She continues to diurese briskly, though feeling somewhat down today.  - Continue lasix gtt, metolazone, diamox, and milrinone as above.  Negative > 5 L again and weight down to previous baseline. CVP 12-13.  - Currently off ARNI and spiro due to recent AKI and hyperkalemia.Marland Kitchen.  - Intolerant bidil with difficulty with headaches, dizziness and hypotension.  - no b-blocker with need for inotropes. - Low voltage QRS- SPEP no M spike.  UPEP.   -Continue unna boots.  2. CAD s/p NSTEMI and CABG 10/12  - Denies CP. Stable on ASA and statin.  - Severe native CAD with [facility].  3. Morbid obesity - Large factor for her.  Needs to watch portion sizes and increase activity at home. Suspect diet at home is much more poor then she lets on.  4. Pulmonary HTN - Mild to mod RHC 12/13/15. PVR 3.24   5. Acute on CKD stage III - Mildly improved from yesterday, though creatinine trending up - Suspect we are nearing as much diuresis as we will get.  6. DM2  - SSI while in house. 7. Hypokalemia - Supp K.   Check Mg.   [facility]. Will also refer for paramedicine.    Check electrolytes.   Possibly to po meds for home tomorrow vs this evening with CVP 12-13.  Length of Stay: [id number]12  Christine SchoolMichael Andrew Tillery, PA-C  10/28/2016, 11:31 AM  Advanced Heart Failure Team Pager [phone] (M-F; 7a - 4p)  Please contact [facility] for night-coverage after hours (4p -7a ) and weekends on  [email or web address]  Patient seen and examined with Otilio SaberAndy Tillery, PA-C. We discussed all aspects of the encounter. I agree with the assessment and plan as stated above.   She is at her previous baseline weight but clinically still appears volume overloaded. Renal function stable. Would continue to push IV diuresis 1-2 more days. Supp K+. Rhythm hard to assess may be MAT vs AF. I think we will likely need to anticoagulate her soon.   Moria Brophy,  [place] Sanon,MD 12:37 PM

## 2016-10-29 DIAGNOSIS — M1000 Idiopathic gout, unspecified site: Secondary | ICD-10-CM

## 2016-10-29 LAB — BASIC METABOLIC PANEL
Anion gap: 12 (ref 5–15)
BUN: 60 mg/dL — AB (ref 6–20)
CO2: 36 mmol/L — ABNORMAL HIGH (ref 22–32)
Calcium: 8.9 mg/dL (ref 8.9–10.3)
Chloride: 85 mmol/L — ABNORMAL LOW (ref 101–111)
Creatinine, Ser: 1.77 mg/dL — ABNORMAL HIGH (ref 0.44–1.00)
GFR calc Af Amer: 34 mL/min — ABNORMAL LOW (ref >60)
GFR, EST NON AFRICAN AMERICAN: 29 mL/min — AB (ref >60)
GLUCOSE: 234 mg/dL — AB (ref 65–99)
POTASSIUM: 3.2 mmol/L — AB (ref 3.5–5.1)
Sodium: 133 mmol/L — ABNORMAL LOW (ref 135–145)

## 2016-10-29 LAB — COOXEMETRY PANEL
CARBOXYHEMOGLOBIN: 2.3 % — AB (ref 0.5–1.5)
METHEMOGLOBIN: 0.8 % (ref 0.0–1.5)
O2 Saturation: 75.9 %
TOTAL HEMOGLOBIN: 11.8 g/dL — AB (ref 12.0–16.0)

## 2016-10-29 LAB — GLUCOSE, CAPILLARY
Glucose-Capillary: 216 mg/dL — ABNORMAL HIGH (ref 65–99)
Glucose-Capillary: 281 mg/dL — ABNORMAL HIGH (ref 65–99)
Glucose-Capillary: 216 mg/dL — ABNORMAL HIGH (ref 65–99)
Glucose-Capillary: 281 mg/dL — ABNORMAL HIGH (ref 65–99)

## 2016-10-29 MED ORDER — PREDNISONE 20 MG PO TABS
40.0000 mg | ORAL_TABLET | Freq: Every day | ORAL | Status: AC
Start: 2016-10-29 — End: 2016-10-31
  Administered 2016-10-29 – 2016-10-31 (×3): 40 mg via ORAL
  Filled 2016-10-29 (×3): qty 2

## 2016-10-29 MED ORDER — MILRINONE LACTATE IN DEXTROSE 20-5 MG/100ML-% IV SOLN
0.2500 ug/kg/min | INTRAVENOUS | Status: DC
  Administered 2016-10-29 – 2016-10-31 (×5): 0.25 ug/kg/min via INTRAVENOUS
  Filled 2016-10-29 (×5): qty 100

## 2016-10-29 NOTE — Progress Notes (Signed)
Advanced Heart Failure Rounding Note  PCP: Arnette FeltsMike Duran  HF: Dr. Gala RomneyBensimhon   Subjective:    Admitted from clinic 10/16/16 with marked volume overload in setting of dietary indiscretion despite increase in diuretics.   Coox 76%.   Remains on lasix drip 30 mg per hour + metolazone 5 mg bid + milrionone 0.25 mcg + diamox 500 mg BID.  NEgative over 2 liters. Weight down another 2 pounds. Overall weight down 42 pounds. Creatinine 1.7 Weight this am 224 lbs, which is her previous discharge weight (09/08/16)  CVP 17-18.   Wants to go home. Mild dyspnea with exertion. Joint hurt - can't bend fingers.     RHC 2/17 RA 25 RV 92/26 PA 86/34 (56) PCWP 37 Fick 5.8/2.4 PVR 3.3   Objective:   Weight Range: 222 lb 8 oz (100.9 kg) Body mass index is 35.91 kg/m.   Vital Signs:   Temp:  [97.8 F (36.6 C)-98.9 F (37.2 C)] 97.8 F (36.6 C) (12/19 0720) Pulse Rate:  [67-72] 71 (12/19 0720) Resp:  [15-19] 15 (12/19 0757) BP: (94-128)/(49-75) 107/67 (12/19 0720) SpO2:  [94 %-98 %] 98 % (12/19 0720) Weight:  [222 lb 8 oz (100.9 kg)] 222 lb 8 oz (100.9 kg) (12/19 0434) Last BM Date: 10/28/16  Weight change: Filed Weights   10/27/16 0413 10/28/16 0452 10/29/16 0434  Weight: 228 lb 8 oz (103.6 kg) 224 lb 3.2 oz (101.7 kg) 222 lb 8 oz (100.9 kg)    Intake/Output:   Intake/Output Summary (Last 24 hours) at 10/29/16 0831 Last data filed at 10/29/16 0000  Gross per 24 hour  Intake              480 ml  Output             2950 ml  Net            -2470 ml     Physical Exam: CVP 17 General: Fatigued appearing. Seated in chair. NAD.   HEENT: Normal. Neck: JVP to jaw. Carotids 2+ bilat; no bruits. No thyromegaly or nodule noted.  Cor: PMI nondisplaced. Distant, Regular. No M/G/R noted.   Lungs: Diminished basilar sounds.  Abdomen: Morbidly obese, soft, NT, ND, no HSM. No bruits or masses. +BS   Extremities: no cyanosis, clubbing, rash. R and LLE unna boots.  LUE PICC . Joints  stiff on both hands. Tender to touch Neuro: alert & orientedx3, cranial nerves grossly intact. moves all 4 extremities w/o difficulty. Affect pleasant  Tele: Reviewed, NSR, PVCs    Labs: CBC No results for input(s): WBC, NEUTROABS, HGB, HCT, MCV, PLT in the last 72 hours. Basic Metabolic Panel  Recent Labs  10/28/16 0443 10/28/16 1200 10/29/16 0420  NA 131*  --  133*  K 3.2*  --  3.2*  CL 82*  --  85*  CO2 37*  --  36*  GLUCOSE 173*  --  234*  BUN 61*  --  60*  CREATININE 1.67*  --  1.77*  CALCIUM 9.4  --  8.9  MG  --  2.7*  --    Liver Function Tests No results for input(s): AST, ALT, ALKPHOS, BILITOT, PROT, ALBUMIN in the last 72 hours. No results for input(s): LIPASE, AMYLASE in the last 72 hours. Cardiac Enzymes No results for input(s): CKTOTAL, CKMB, CKMBINDEX, TROPONINI in the last 72 hours.  BNP: BNP (last 3 results)  Recent Labs  08/25/16 2119 09/26/16 1442 10/16/16 1512  BNP 1,359.9* 1,058.8* 903.7*  ProBNP (last 3 results) No results for input(s): PROBNP in the last 8760 hours.   D-Dimer No results for input(s): DDIMER in the last 72 hours. Hemoglobin A1C No results for input(s): HGBA1C in the last 72 hours. Fasting Lipid Panel No results for input(s): CHOL, HDL, LDLCALC, TRIG, CHOLHDL, LDLDIRECT in the last 72 hours. Thyroid Function Tests No results for input(s): TSH, T4TOTAL, T3FREE, THYROIDAB in the last 72 hours.  Invalid input(s): FREET3  Other results:     Imaging/Studies:  No results found.  Latest Echo  Latest Cath   Medications:     Scheduled Medications: . acetaZOLAMIDE  500 mg Oral BID  . aspirin EC  81 mg Oral Daily  . enoxaparin (LOVENOX) injection  40 mg Subcutaneous Q24H  . gabapentin  300 mg Oral TID  . insulin aspart  0-15 Units Subcutaneous TID WC  . insulin glargine  18 Units Subcutaneous Daily  . metolazone  5 mg Oral BID  . potassium chloride  40 mEq Oral QID  . sodium chloride flush  3 mL  Intravenous Q12H  . spironolactone  25 mg Oral Daily    Infusions: . furosemide (LASIX) infusion 30 mg/hr (10/29/16 0214)  . milrinone 0.25 mcg/kg/min (10/29/16 0602)    PRN Medications: sodium chloride, acetaminophen, ondansetron (ZOFRAN) IV, sodium chloride flush   Assessment/Plan   1. 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 - [facility]- EF 30-35% RV normal.  - Echo on 12/7 with poor imaged LVEF probably in 35-40% range but severe RV failure and PAH  Continue lasix gtt, metolazone, diamox, and milrinone as above.  Negative > 2 L again and weight down to previous baseline. CVP 15 - Currently off ARNI and spiro due to recent AKI and hyperkalemia.Marland Kitchen.  - Intolerant bidil with difficulty with headaches, dizziness and hypotension.  - no b-blocker with need for inotropes. - Low voltage QRS- SPEP no M spike.  UPEP.   -Continue unna boots.  2. CAD s/p NSTEMI and CABG 10/12  - Denies CP. Stable on ASA and statin.  - Severe native CAD [facility].  3. Morbid obesity -Needs to lose weight. Discussed portion control. Consult dietitian.   4. Pulmonary HTN - Mild to mod RHC 12/13/15. PVR 3.24   5. Acute on CKD stage III - Mildly improved from yesterday, though creatinine trending up - Suspect we are nearing as much diuresis as we will get.  6. DM2  - SSI while in house. 7. Hypokalemia - Supp K.   Check Mg.  8. Acute gout  Needs another day of IV diuresis.   [facility] Will also refer for paramedicine.       Length of Stay: 13  Tonye BecketAmy Clegg, NP  10/29/2016, 8:31 AM  Advanced Heart Failure Team Pager [phone] (M-F; 7a - 4p)  Please contact [facility] for night-coverage after hours (4p -7a ) and weekends on [email or web address]  Patient seen and examined with Tonye BecketAmy Clegg, NP. We discussed all aspects of the encounter. I agree with the assessment and plan as stated above.   Volume status improving.  Now below baseline weight but still fluid overloaded. Renal function stable. Will continue IV diuresis and milrinone. Has acute gout. Treat with oral prednisone.   Bensimhon, Daniel,MD 10:35 AM

## 2016-10-29 NOTE — Progress Notes (Signed)
CARDIAC REHAB PHASE I   PRE:  Rate/Rhythm: 81 SR c/ PVCs  BP:  Sitting: 122/77        SaO2: 98 RA  MODE:  Ambulation: 460 ft   POST:  Rate/Rhythm: 92 SR c/ PVCs  BP:  Sitting: 116/77         SaO2: 99 RA  Pt assisted to bedside commode prior to ambulation. Pt states she is feeling better today, agreeable to walk. Pt ambulated 460 ft on RA, IV, assist x1 (pt insisted on pushing IV pole, declined use of walker), steady gait, tolerated well. Pt c/o R leg/foot pain, denies any other complaints, declined rest stop. Pt to recliner after walk, feet elevated, call bell within reach. Will follow.   [phone] Christine GrapesEmily C Hermenia Fritcher, RN, BSN 10/29/2016 2:06 PM

## 2016-10-29 NOTE — Progress Notes (Signed)
Nutrition Education Note  RD consulted for nutrition education regarding CHF.  RD provided "Low Sodium Nutrition Therapy" handout from the Academy of [employer]. Reviewed patient's dietary recall. Provided examples on ways to decrease sodium intake in diet. Discouraged intake of processed foods and use of salt shaker. Encouraged fresh fruits and vegetables as well as whole grain sources of carbohydrates to maximize fiber intake.   RD discussed why it is important for patient to adhere to diet recommendations, and emphasized the role of fluids, foods to avoid, and importance of weighing self daily. Teach back method used.  Expect good compliance.  Body mass index is 35.91 kg/m. Pt meets criteria for obesity based on current BMI.  Current diet order is 2 gm sodium, patient is consuming approximately 100% of meals at this time. Labs and medications reviewed. No further nutrition interventions warranted at this time. RD contact information provided. If additional nutrition issues arise, please re-consult RD.   Joaquin CourtsKimberly Harris, RD, LDN, CNSC Pager [phone] After Hours Pager [phone]

## 2016-10-30 ENCOUNTER — Encounter (HOSPITAL_COMMUNITY): Payer: Medicare Other

## 2016-10-30 DIAGNOSIS — I2721 Secondary pulmonary arterial hypertension: Secondary | ICD-10-CM

## 2016-10-30 LAB — BASIC METABOLIC PANEL
Anion gap: 13 (ref 5–15)
BUN: 72 mg/dL — AB (ref 6–20)
CHLORIDE: 82 mmol/L — AB (ref 101–111)
CO2: 34 mmol/L — AB (ref 22–32)
CREATININE: 1.78 mg/dL — AB (ref 0.44–1.00)
Calcium: 9 mg/dL (ref 8.9–10.3)
GFR calc Af Amer: 33 mL/min — ABNORMAL LOW (ref >60)
GFR calc non Af Amer: 29 mL/min — ABNORMAL LOW (ref >60)
Glucose, Bld: 338 mg/dL — ABNORMAL HIGH (ref 65–99)
Potassium: 3.2 mmol/L — ABNORMAL LOW (ref 3.5–5.1)
SODIUM: 129 mmol/L — AB (ref 135–145)

## 2016-10-30 LAB — GLUCOSE, CAPILLARY
GLUCOSE-CAPILLARY: 390 mg/dL — AB (ref 65–99)
Glucose-Capillary: 268 mg/dL — ABNORMAL HIGH (ref 65–99)
Glucose-Capillary: 317 mg/dL — ABNORMAL HIGH (ref 65–99)
Glucose-Capillary: 342 mg/dL — ABNORMAL HIGH (ref 65–99)

## 2016-10-30 LAB — COOXEMETRY PANEL
Carboxyhemoglobin: 1.9 % — ABNORMAL HIGH (ref 0.5–1.5)
Methemoglobin: 1.2 % (ref 0.0–1.5)
O2 SAT: 71.4 %
TOTAL HEMOGLOBIN: 11.3 g/dL — AB (ref 12.0–16.0)

## 2016-10-30 MED ORDER — POTASSIUM CHLORIDE CRYS ER 20 MEQ PO TBCR
40.0000 meq | EXTENDED_RELEASE_TABLET | Freq: Two times a day (BID) | ORAL | Status: DC
  Administered 2016-10-30 – 2016-11-02 (×6): 40 meq via ORAL
  Filled 2016-10-30 (×6): qty 2

## 2016-10-30 MED ORDER — TORSEMIDE 20 MG PO TABS
60.0000 mg | ORAL_TABLET | Freq: Two times a day (BID) | ORAL | Status: DC
  Administered 2016-10-30 – 2016-10-31 (×2): 60 mg via ORAL
  Filled 2016-10-30 (×2): qty 3

## 2016-10-30 NOTE — Progress Notes (Signed)
Advanced Heart Failure Rounding Note  PCP: Arnette FeltsMike Duran  HF: Dr. Gala RomneyBensimhon   Subjective:    Admitted from clinic 10/16/16 with marked volume overload in setting of dietary indiscretion despite increase in diuretics.   Coox 71.4% on milrinone 0.25 to augment diuresis.   Out another 3.2 L on lasix gtt at 30 mg/hr + metolazone 5 mg BID + diamox 500 mg BID.  Weight down another 3 lbs.  Down 45 lbs total this admission. Creatinine relatively stable around 1.7. BUN continues to trend up.   Breathing is much better.  Fell this morning. States she felt weak in the knees. Denies injury   RHC 2/17 RA 25 RV 92/26 PA 86/34 (56) PCWP 37 Fick 5.8/2.4 PVR 3.3   Objective:   Weight Range: 219 lb (99.3 kg) Body mass index is 35.35 kg/m.   Vital Signs:   Temp:  [97.5 F (36.4 C)-98.7 F (37.1 C)] 98.4 F (36.9 C) (12/20 0808) Pulse Rate:  [67-81] 73 (12/20 0808) Resp:  [17-23] 17 (12/20 0808) BP: (102-127)/(55-70) 103/55 (12/20 0808) SpO2:  [90 %-100 %] 100 % (12/20 0808) Weight:  [219 lb (99.3 kg)] 219 lb (99.3 kg) (12/20 0500) Last BM Date: 10/28/16  Weight change: Filed Weights   10/28/16 0452 10/29/16 0434 10/30/16 0500  Weight: 224 lb 3.2 oz (101.7 kg) 222 lb 8 oz (100.9 kg) 219 lb (99.3 kg)    Intake/Output:   Intake/Output Summary (Last 24 hours) at 10/30/16 1031 Last data filed at 10/30/16 1009  Gross per 24 hour  Intake              540 ml  Output             3200 ml  Net            -2660 ml     Physical Exam: CVP 16-17 General: Fatigued. NAD at rest.    HEENT: Normal Neck: JVP still appears elevated up to jaw. Carotids 2+ bilat; no bruits. No thyromegaly or nodule noted.  Cor: PMI nondisplaced. Distant, Regular. No M/G/R noted.   Lungs: Decreased basilar sounds. Abdomen: Morbidly obese, soft, NT, ND, no HSM. No bruits or masses. +BS   Extremities: No cyanosis, clubbing, rash. R and LLE unna boots.  LUE PICC. Hands slightly improved. Neuro: Alert &  orientedx3, cranial nerves grossly intact. moves all 4 extremities w/o difficulty. Affect pleasant  Tele: Reviewed, NSR, occasional PVCs  Labs: CBC No results for input(s): WBC, NEUTROABS, HGB, HCT, MCV, PLT in the last 72 hours. Basic Metabolic Panel  Recent Labs  10/28/16 1200 10/29/16 0420 10/30/16 0651  NA  --  133* 129*  K  --  3.2* 3.2*  CL  --  85* 82*  CO2  --  36* 34*  GLUCOSE  --  234* 338*  BUN  --  60* 72*  CREATININE  --  1.77* 1.78*  CALCIUM  --  8.9 9.0  MG 2.7*  --   --    Liver Function Tests No results for input(s): AST, ALT, ALKPHOS, BILITOT, PROT, ALBUMIN in the last 72 hours. No results for input(s): LIPASE, AMYLASE in the last 72 hours. Cardiac Enzymes No results for input(s): CKTOTAL, CKMB, CKMBINDEX, TROPONINI in the last 72 hours.  BNP: BNP (last 3 results)  Recent Labs  08/25/16 2119 09/26/16 1442 10/16/16 1512  BNP 1,359.9* 1,058.8* 903.7*    ProBNP (last 3 results) No results for input(s): PROBNP in the last 8760 hours.  D-Dimer No results for input(s): DDIMER in the last 72 hours. Hemoglobin A1C No results for input(s): HGBA1C in the last 72 hours. Fasting Lipid Panel No results for input(s): CHOL, HDL, LDLCALC, TRIG, CHOLHDL, LDLDIRECT in the last 72 hours. Thyroid Function Tests No results for input(s): TSH, T4TOTAL, T3FREE, THYROIDAB in the last 72 hours.  Invalid input(s): FREET3  Other results:     Imaging/Studies:  No results found.  Latest Echo  Latest Cath   Medications:     Scheduled Medications: . aspirin EC  81 mg Oral Daily  . enoxaparin (LOVENOX) injection  40 mg Subcutaneous Q24H  . gabapentin  300 mg Oral TID  . insulin aspart  0-15 Units Subcutaneous TID WC  . insulin glargine  18 Units Subcutaneous Daily  . predniSONE  40 mg Oral Q breakfast  . sodium chloride flush  3 mL Intravenous Q12H  . spironolactone  25 mg Oral Daily    Infusions: . milrinone 0.25 mcg/kg/min (10/30/16 0522)     PRN Medications: sodium chloride, acetaminophen, ondansetron (ZOFRAN) IV, sodium chloride flush   Assessment/Plan   1. 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 - [facility]- EF 30-35% RV normal.  - Echo on 12/7 with poor imaged LVEF probably in 35-40% range but severe RV failure and PAH - Stop IV diuresis today.  Will let kidneys rest today and start back on po diuretics tomorrow. Suspect this is as dry as we will get her while preserving her kidneys.  - Currently off ARNI and spiro due to recent AKI and hyperkalemia.Marland Kitchen.  - Intolerant bidil with difficulty with headaches, dizziness and hypotension.  - No b-blocker with need for inotropes. - Low voltage QRS- SPEP no M spike.  UPEP.   - Continue Unna boots.  2. CAD s/p NSTEMI and CABG 10/12  - Denies CP. Stable on ASA and statin.  - Severe native [facility].  3. Morbid obesity - Needs to lose weight. Discussed portion control. Consult dietitian.   4. Pulmonary HTN - Mild to mod RHC 12/13/15. PVR 3.24   5. Acute on CKD stage III - Creatinine stable. BUN trending up. 6. DM2  - SSI while in house. 7. Hypokalemia - Supp K today  8. Acute gout - Continue oral prednisone.   BUN trending up. Feeling weak and is below previous discharge weight.   With fall and BUN up will watch overnight.  Resume home torsemide tonight.   She will need home health with PT. Will also need Paramedicine.   Length of Stay: [address]14  Graciella FreerMichael Andrew Tillery, [place]-C  10/30/2016, 10:31 AM  Advanced Heart Failure Team Pager [phone] (M-F; 7a - 4p)  Please contact [facility] for night-coverage after hours (4p -7a ) and weekends on [email or web address]  Patient seen and examined with Otilio SaberAndy Tillery, PA-C. We discussed all aspects of the encounter. I agree with the assessment and plan as stated above.   Volume status continues to improve but CVP still 16. BUN bumping however. This  may be as much as her RV will tolerate. Switch to po diuretics. Continue milrinone. Gout improved with prednisone.   Ruthann Angulo,MD 1:33 PM

## 2016-10-30 NOTE — Progress Notes (Signed)
Pt was assisted up to be weighed this morning. While standing, pt's knee gave out and fell to the floor. Pt denies any pain or discomfort and no obvious injuries observed. Vital signs post fall were BP 127/58 HR 81 R 17. MD notified and pt requested I not contact her family. She will tell them today.

## 2016-10-30 NOTE — Progress Notes (Signed)
CCMD advised pt had a  6 beat run of Vtach. Will continue to monitor the pt.

## 2016-10-30 NOTE — Progress Notes (Signed)
CARDIAC REHAB PHASE I   PRE:  Rate/Rhythm: 74 SR PVCs/PACs  BP:  Supine:   Sitting: 107/68  Standing:    SaO2: 98%RA  MODE:  Ambulation: 460 ft   POST:  Rate/Rhythm: 85 SR PVCs/PACs  BP:  Supine:   Sitting: 122/76  Standing:    SaO2: 94%RA 1335-1405 Pt stated she was dizzy this morning but not now. Walked 460 ft with minimal asst with fairly steady gait. One asst followed with wheelchair in case she needed to sit. No dizziness. Tired by end of walk. Lot of PVCs but tolerated well. To recliner with chair alarm.   Luetta [name] Aneesah Hernan, RN BSN  10/30/2016 1:59 PM

## 2016-10-30 NOTE — Care Management Important Message (Signed)
Important Message  Patient Details  Name: Lum BabeBarbara Traynor MRN: [id number] Date of Birth: [birth year]   Medicare Important Message Given:  Yes    Kyla BalzarineShealy, Genola Yuille Abena 10/30/2016, 10:27 AM

## 2016-10-31 LAB — BASIC METABOLIC PANEL
ANION GAP: 13 (ref 5–15)
BUN: 81 mg/dL — ABNORMAL HIGH (ref 6–20)
CHLORIDE: 84 mmol/L — AB (ref 101–111)
CO2: 33 mmol/L — ABNORMAL HIGH (ref 22–32)
Calcium: 9.5 mg/dL (ref 8.9–10.3)
Creatinine, Ser: 1.76 mg/dL — ABNORMAL HIGH (ref 0.44–1.00)
GFR calc Af Amer: 34 mL/min — ABNORMAL LOW (ref >60)
GFR, EST NON AFRICAN AMERICAN: 29 mL/min — AB (ref >60)
Glucose, Bld: 212 mg/dL — ABNORMAL HIGH (ref 65–99)
POTASSIUM: 3.1 mmol/L — AB (ref 3.5–5.1)
SODIUM: 130 mmol/L — AB (ref 135–145)

## 2016-10-31 LAB — GLUCOSE, CAPILLARY
GLUCOSE-CAPILLARY: 183 mg/dL — AB (ref 65–99)
GLUCOSE-CAPILLARY: 444 mg/dL — AB (ref 65–99)
Glucose-Capillary: 296 mg/dL — ABNORMAL HIGH (ref 65–99)
Glucose-Capillary: 396 mg/dL — ABNORMAL HIGH (ref 65–99)
Glucose-Capillary: 382 mg/dL — ABNORMAL HIGH (ref 65–99)

## 2016-10-31 LAB — COOXEMETRY PANEL
Carboxyhemoglobin: 2 % — ABNORMAL HIGH (ref 0.5–1.5)
METHEMOGLOBIN: 0.6 % (ref 0.0–1.5)
O2 Saturation: 58 %
Total hemoglobin: 12.4 g/dL (ref 12.0–16.0)

## 2016-10-31 MED ORDER — POTASSIUM CHLORIDE CRYS ER 20 MEQ PO TBCR
40.0000 meq | EXTENDED_RELEASE_TABLET | Freq: Once | ORAL | Status: AC
  Administered 2016-10-31: 40 meq via ORAL
  Filled 2016-10-31: qty 2

## 2016-10-31 MED ORDER — INSULIN ASPART 100 UNIT/ML ~~LOC~~ SOLN
5.0000 [IU] | Freq: Once | SUBCUTANEOUS | Status: AC
  Administered 2016-10-31: 5 [IU] via SUBCUTANEOUS

## 2016-10-31 MED ORDER — FUROSEMIDE 10 MG/ML IJ SOLN
20.0000 mg/h | INTRAVENOUS | Status: DC
  Administered 2016-10-31 (×2): 20 mg/h via INTRAVENOUS
  Filled 2016-10-31 (×4): qty 25

## 2016-10-31 MED ORDER — DOPAMINE-DEXTROSE 3.2-5 MG/ML-% IV SOLN
2.5000 ug/kg/min | INTRAVENOUS | Status: DC
  Administered 2016-10-31: 2.5 ug/kg/min via INTRAVENOUS
  Filled 2016-10-31: qty 250

## 2016-10-31 MED ORDER — INSULIN ASPART 100 UNIT/ML ~~LOC~~ SOLN
20.0000 [IU] | Freq: Once | SUBCUTANEOUS | Status: AC
  Administered 2016-10-31: 20 [IU] via SUBCUTANEOUS

## 2016-10-31 NOTE — Progress Notes (Signed)
MD Bensimhon notified of pt's cbg being 444. New orders for 20 units of short acting insulin. Will re-check cbg 1 hour afterwards & continue to monitor the pt. Sanda LingerMilam, Marguita Venning R, RN

## 2016-10-31 NOTE — Progress Notes (Signed)
Advanced Heart Failure Rounding Note  PCP: Arnette Felts  HF: Dr. Gala Romney   Subjective:    Admitted from clinic 10/16/16 with marked volume overload in setting of dietary indiscretion despite increase in diuretics.   Coox 58% on milrinone 0.25.  Got po torsemide last night.  Only out 250 cc. Weight shows up 1 lb. Down 44 lbs total this admission.  Creatinine relatively stable. BUN trending up still.     CVP back up to 19-20. Poor UO overnight.  Despite this, pt states she is feeling much better and wants to go home today. Eating a lot of ice and drinking ++ fluid.  RHC 2/17 RA 25 RV 92/26 PA 86/34 (56) PCWP 37 Fick 5.8/2.4 PVR 3.3   Objective:   Weight Range: 220 lb 9.6 oz (100.1 kg) Body mass index is 35.61 kg/m.   Vital Signs:   Temp:  [97.5 F (36.4 C)-98.4 F (36.9 C)] 97.9 F (36.6 C) (12/21 0737) Pulse Rate:  [68-70] 69 (12/21 0737) Resp:  [13-31] 15 (12/21 0749) BP: (92-123)/(59-77) 109/76 (12/21 0749) SpO2:  [95 %-100 %] 100 % (12/21 0737) Weight:  [220 lb 9.6 oz (100.1 kg)] 220 lb 9.6 oz (100.1 kg) (12/21 0429) Last BM Date: 10/30/16 (per pt & normal)  Weight change: Filed Weights   10/29/16 0434 10/30/16 0500 10/31/16 0429  Weight: 222 lb 8 oz (100.9 kg) 219 lb (99.3 kg) 220 lb 9.6 oz (100.1 kg)    Intake/Output:   Intake/Output Summary (Last 24 hours) at 10/31/16 0908 Last data filed at 10/31/16 0654  Gross per 24 hour  Intake             1550 ml  Output             1800 ml  Net             -250 ml     Physical Exam: CVP 19-20 reclined in chair.  General: Fatigued. NAD at rest.    HEENT: Normal Neck: JVP to jaw with prominent CV waves. Carotids 2+ bilat; no bruits. No thyromegaly or nodule noted.  Cor: PMI nondisplaced. Distant, Regular. No M/G/R noted.   Lungs: Diminished basilar sounds.  Abdomen: Morbidly obese, soft, NT, ND, no HSM. No bruits or masses. +BS  Extremities: No cyanosis, clubbing, rash. R and LLE unna boots. 1+ edema.  LUE PICC.  Neuro: Alert & orientedx3, cranial nerves grossly intact. moves all 4 extremities w/o difficulty. Affect pleasant  Tele: Reviewed, NSR with PVCs and PACs.   Labs: CBC No results for input(s): WBC, NEUTROABS, HGB, HCT, MCV, PLT in the last 72 hours. Basic Metabolic Panel  Recent Labs  10/28/16 1200  10/30/16 0651 10/31/16 0535  NA  --   < > 129* 130*  K  --   < > 3.2* 3.1*  CL  --   < > 82* 84*  CO2  --   < > 34* 33*  GLUCOSE  --   < > 338* 212*  BUN  --   < > 72* 81*  CREATININE  --   < > 1.78* 1.76*  CALCIUM  --   < > 9.0 9.5  MG 2.7*  --   --   --   < > = values in this interval not displayed. Liver Function Tests No results for input(s): AST, ALT, ALKPHOS, BILITOT, PROT, ALBUMIN in the last 72 hours. No results for input(s): LIPASE, AMYLASE in the last 72 hours. Cardiac Enzymes No  results for input(s): CKTOTAL, CKMB, CKMBINDEX, TROPONINI in the last 72 hours.  BNP: BNP (last 3 results)  Recent Labs  08/25/16 2119 09/26/16 1442 10/16/16 1512  BNP 1,359.9* 1,058.8* 903.7*    ProBNP (last 3 results) No results for input(s): PROBNP in the last 8760 hours.   D-Dimer No results for input(s): DDIMER in the last 72 hours. Hemoglobin A1C No results for input(s): HGBA1C in the last 72 hours. Fasting Lipid Panel No results for input(s): CHOL, HDL, LDLCALC, TRIG, CHOLHDL, LDLDIRECT in the last 72 hours. Thyroid Function Tests No results for input(s): TSH, T4TOTAL, T3FREE, THYROIDAB in the last 72 hours.  Invalid input(s): FREET3  Other results:     Imaging/Studies:  No results found.  Latest Echo  Latest Cath   Medications:     Scheduled Medications: . aspirin EC  81 mg Oral Daily  . enoxaparin (LOVENOX) injection  40 mg Subcutaneous Q24H  . gabapentin  300 mg Oral TID  . insulin aspart  0-15 Units Subcutaneous TID WC  . insulin glargine  18 Units Subcutaneous Daily  . potassium chloride  40 mEq Oral BID  . sodium chloride flush  3  mL Intravenous Q12H  . spironolactone  25 mg Oral Daily  . torsemide  60 mg Oral BID    Infusions: . milrinone 0.25 mcg/kg/min (10/31/16 0719)    PRN Medications: sodium chloride, acetaminophen, ondansetron (ZOFRAN) IV, sodium chloride flush   Assessment/Plan   1. 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 - [facility]- EF 30-35% RV normal.  - Echo on 12/7 with poor imaged LVEF probably in 35-40% range but severe RV failure and PAH - Coox 58% in milrinone 0.25 mcg/kg/min.  Suspect primarily her RV failure.  - Got morning torsemide. She remains volume overloaded on exam but BUN trending up. Will switch back to lasix infusion and add dopamine for renal support.  - Currently off ARNI and spiro due to recent AKI and hyperkalemia.Marland Kitchen.  - Intolerant bidil with difficulty with headaches, dizziness and hypotension.  - No b-blocker with need for inotropes. - Low voltage QRS- SPEP no M spike.  UPEP.   - Continue Unna boots.  2. CAD s/p NSTEMI and CABG 10/12  - Denies CP. Stable on ASA and statin.  - Severe native CAD [facility].  3. Morbid obesity - Urged to watch portion control and increase activity as able.  4. Pulmonary HTN - Mild to mod RHC 12/13/15. PVR 3.24   5. Acute on CKD stage III - Creatinine stable. BUN still going up.  6. DM2  - SSI while in house. 7. Hypokalemia - Continue K supp.  8. Acute gout - Continue oral prednisone.   Suspect that severe RV failure is driving Ms Drollinger symptoms, combined with continued fluid non-compliance. Pt remains at very high risk for re-admission.  Suspect that her prognosis is very poor.   Continue milrinone for RV support. Will resume lasix infusion at 20 mg/hr and add renal dose Dopamine to support.   Length of Stay: [address]15  Graciella FreerMichael Andrew Tillery, [place]-C  10/31/2016, 9:08 AM  Advanced Heart Failure Team Pager [phone] (M-F; 7a - 4p)  Please contact  [facility] for night-coverage after hours (4p -7a ) and weekends on [email or web address]  Patient seen and examined with Otilio SaberAndy Tillery, PA-C. We discussed all aspects of the encounter. I agree with the assessment and plan as stated above.   She  has diuresed well but CVP remains high in setting of severe RV failure.. Now becoming more symptomatic and creatinine rising. Will start dopamine. Resume IV lasix. Watch closely for AF. Adjust insulin for high sugars. May be nearing point where we need Palliative Care involvement.   Talor Cheema,MD 9:20 PM

## 2016-10-31 NOTE — Progress Notes (Signed)
Physical Therapy Evaluation Patient Details Name: Christine Knight MRN: [id number] DOB: 03/27/1951 Today's Date: 10/31/2016   History of Present Illness  pt presents with CHF.  pt with hx of CHF, CAD, NSTEMI, CABG, Pulmonary HTN, HTN, CKD, and DM.    Clinical Impression  Pt moving well despite c/o mild dizziness, which wasn't worse with mobility.  Pt indicates her daughter is able to provide A at home as needed.  Feel pt would benefit from HHPT to help pt return to baseline.  Will continue to follow.      Follow Up Recommendations Home health PT;Supervision - Intermittent    Equipment Recommendations  Rolling walker with 5" wheels;3in1 (PT)    Recommendations for Other Services       Precautions / Restrictions Precautions Precautions: Fall Precaution Comments: pt fell this admit. Restrictions Weight Bearing Restrictions: No      Mobility  Bed Mobility               General bed mobility comments: pt sitting in chair.  Transfers Overall transfer level: Needs assistance Equipment used: None Transfers: Sit to/from Stand Sit to Stand: Supervision            Ambulation/Gait Ambulation/Gait assistance: Supervision Ambulation Distance (Feet): 300 Feet Assistive device:  (IV pole) Gait Pattern/deviations: Step-through pattern;Decreased stride length     General Gait Details: pt indicates mild dizziness and states she thinks this is due to changes in her meds and loss of fluid since admit.  pt holds IV pole for security  Stairs            Wheelchair Mobility    Modified Rankin (Stroke Patients Only)       Balance Overall balance assessment: Needs assistance;History of Falls Sitting-balance support: No upper extremity supported;Feet supported Sitting balance-[name] Scale: Good     Standing balance support: Single extremity supported;No upper extremity supported;During functional activity Standing balance-[name] Scale: Fair                                Pertinent Vitals/Pain Pain Assessment: No/denies pain    Home Living Family/patient expects to be discharged to:: Private residence Living Arrangements: Children;Other relatives (Daughter and Sister) Available Help at Discharge: Family;Available 24 hours/day Type of Home: Apartment Home Access: Stairs to enter Entrance Stairs-Rails: Right Entrance Stairs-Number of Steps: 2 Home Layout: One level Home Equipment: Cane - single point      Prior Function Level of Independence: Independent         Comments: pt does everything for herself except her daughter helps with laundry.       Hand Dominance   Dominant Hand: Right    Extremity/Trunk Assessment   Upper Extremity Assessment Upper Extremity Assessment: Overall WFL for tasks assessed    Lower Extremity Assessment Lower Extremity Assessment: Generalized weakness    Cervical / Trunk Assessment Cervical / Trunk Assessment: Normal  Communication   Communication: No difficulties  Cognition Arousal/Alertness: Awake/alert Behavior During Therapy: WFL for tasks assessed/performed Overall Cognitive Status: Within Functional Limits for tasks assessed                      General Comments      Exercises     Assessment/Plan    PT Assessment Patient needs continued PT services  PT Problem List Decreased strength;Decreased activity tolerance;Decreased balance;Decreased mobility;Decreased knowledge of use of DME  PT Treatment Interventions DME instruction;Gait training;Stair training;Functional mobility training;Therapeutic activities;Therapeutic exercise;Balance training;Patient/family education    PT Goals (Current goals can be found in the Care Plan section)  Acute Rehab PT Goals Patient Stated Goal: Home PT Goal Formulation: With patient Time For Goal Achievement: 11/07/16 Potential to Achieve Goals: Good    Frequency Min 3X/week   Barriers to discharge         Co-evaluation               End of Session Equipment Utilized During Treatment: Gait belt Activity Tolerance: Patient tolerated treatment well Patient left: in chair;with call bell/phone within reach;with chair alarm set Nurse Communication: Mobility status         Time: [id number] PT Time Calculation (min) (ACUTE ONLY): 21 min   Charges:   PT Evaluation $PT Eval Moderate Complexity: 1 Procedure     PT G CodesAlison Murray:        Aloise Copus F Nikoleta Dady, PT  [phone] 10/31/2016, 2:18 PM

## 2016-10-31 NOTE — Progress Notes (Addendum)
  CARDIAC REHAB PHASE I  Pt up ambulating with PT. Will follow up today as schedule permits.   Joylene GrapesEmily C Sameeha Rockefeller, RN, BSN 10/31/2016 11:48 AM

## 2016-10-31 NOTE — Progress Notes (Signed)
CARDIAC REHAB PHASE I   PRE:  Rate/Rhythm: 79 SR with PVCs    BP: sitting 116/74    SaO2: 97 RA  MODE:  Ambulation: 450 ft   POST:  Rate/Rhythm: 95 SR with PVCs    BP: sitting 126/74     SaO2: 96 RA   Pt c/o slight dizziness but sts it did not affect her walking. She did want to hold to the IV pole today. Sts she feels like her thinking is not right at times, has trouble with her memory. Return to recliner. VSS. Gave reminders of diet and daily wts. She could not recall 2000 mg sodium restriction but told me what she tries to eat. She could recall weighing guidelines. Declined another HF booklet, sts she has a "fresh copy" at home. [id number]  Harriet MassonRandi Kristan Brennen Camper CES, ACSM 10/31/2016 2:44 PM

## 2016-11-01 ENCOUNTER — Encounter (HOSPITAL_COMMUNITY): Payer: Medicare Other

## 2016-11-01 LAB — BASIC METABOLIC PANEL
Anion gap: 13 (ref 5–15)
BUN: 86 mg/dL — ABNORMAL HIGH (ref 6–20)
CALCIUM: 9.5 mg/dL (ref 8.9–10.3)
CO2: 35 mmol/L — AB (ref 22–32)
Chloride: 82 mmol/L — ABNORMAL LOW (ref 101–111)
Creatinine, Ser: 1.7 mg/dL — ABNORMAL HIGH (ref 0.44–1.00)
GFR calc Af Amer: 35 mL/min — ABNORMAL LOW (ref >60)
GFR, EST NON AFRICAN AMERICAN: 30 mL/min — AB (ref >60)
GLUCOSE: 242 mg/dL — AB (ref 65–99)
Potassium: 3.3 mmol/L — ABNORMAL LOW (ref 3.5–5.1)
Sodium: 130 mmol/L — ABNORMAL LOW (ref 135–145)

## 2016-11-01 LAB — GLUCOSE, CAPILLARY
GLUCOSE-CAPILLARY: 199 mg/dL — AB (ref 65–99)
Glucose-Capillary: 226 mg/dL — ABNORMAL HIGH (ref 65–99)
Glucose-Capillary: 318 mg/dL — ABNORMAL HIGH (ref 65–99)
Glucose-Capillary: 86 mg/dL (ref 65–99)
Glucose-Capillary: 217 mg/dL — ABNORMAL HIGH (ref 65–99)

## 2016-11-01 LAB — MAGNESIUM: MAGNESIUM: 2.6 mg/dL — AB (ref 1.7–2.4)

## 2016-11-01 LAB — COOXEMETRY PANEL
CARBOXYHEMOGLOBIN: 1.6 % — AB (ref 0.5–1.5)
METHEMOGLOBIN: 0.9 % (ref 0.0–1.5)
O2 SAT: 81.4 %
TOTAL HEMOGLOBIN: 12.8 g/dL (ref 12.0–16.0)

## 2016-11-01 MED ORDER — MILRINONE LACTATE IN DEXTROSE 20-5 MG/100ML-% IV SOLN
0.1250 ug/kg/min | INTRAVENOUS | Status: DC
  Administered 2016-11-01: 0.125 ug/kg/min via INTRAVENOUS
  Filled 2016-11-01: qty 100

## 2016-11-01 MED ORDER — POTASSIUM CHLORIDE CRYS ER 20 MEQ PO TBCR
40.0000 meq | EXTENDED_RELEASE_TABLET | Freq: Once | ORAL | Status: AC
  Administered 2016-11-01: 40 meq via ORAL
  Filled 2016-11-01: qty 2

## 2016-11-01 MED ORDER — TORSEMIDE 20 MG PO TABS
60.0000 mg | ORAL_TABLET | Freq: Two times a day (BID) | ORAL | Status: DC
Start: 2016-11-01 — End: 2016-11-02
  Administered 2016-11-01 – 2016-11-02 (×3): 60 mg via ORAL
  Filled 2016-11-01 (×3): qty 3

## 2016-11-01 MED ORDER — INSULIN GLARGINE 100 UNIT/ML ~~LOC~~ SOLN
20.0000 [IU] | Freq: Every day | SUBCUTANEOUS | Status: DC
  Administered 2016-11-01 – 2016-11-02 (×2): 20 [IU] via SUBCUTANEOUS
  Filled 2016-11-01 (×2): qty 0.2

## 2016-11-01 NOTE — Progress Notes (Signed)
Pt refused to allow nurse tech to place chair alarm under her. Also will not call if needs bathroom assistance. Educated pt on importance to call to ensure safety. Pt understands.

## 2016-11-01 NOTE — Progress Notes (Addendum)
Advanced Heart Failure Rounding Note  PCP: Arnette FeltsMike Duran  HF: Dr. Gala RomneyBensimhon   Subjective:    Admitted from clinic 10/16/16 with marked volume overload in setting of dietary indiscretion despite increase in diuretics.   Coox 81% on milrinone 0.25 mcg + 2.5 mcg dopamine. Remains on lasix drip at 20 mg per hour. Yesterday she was started on dopamine drip. Weight down another 3 pounds. (47 pounds)  Felt poorly yesterday but much better today. Wants to go home.   RHC 2/17 RA 25 RV 92/26 PA 86/34 (56) PCWP 37 Fick 5.8/2.4 PVR 3.3   Objective:   Weight Range: 217 lb 8 oz (98.7 kg) Body mass index is 35.11 kg/m.   Vital Signs:   Temp:  [97.2 F (36.2 C)-98.2 F (36.8 C)] 98.2 F (36.8 C) (12/22 0802) Pulse Rate:  [69-80] 71 (12/22 0802) Resp:  [12-24] 16 (12/22 0802) BP: (99-115)/(60-64) 115/62 (12/22 0802) SpO2:  [93 %-98 %] 98 % (12/22 0802) Weight:  [217 lb 8 oz (98.7 kg)] 217 lb 8 oz (98.7 kg) (12/22 0511) Last BM Date: 10/31/16  Weight change: Filed Weights   10/30/16 0500 10/31/16 0429 11/01/16 0511  Weight: 219 lb (99.3 kg) 220 lb 9.6 oz (100.1 kg) 217 lb 8 oz (98.7 kg)    Intake/Output:   Intake/Output Summary (Last 24 hours) at 11/01/16 0831 Last data filed at 11/01/16 0612  Gross per 24 hour  Intake          1583.31 ml  Output             3250 ml  Net         -1666.69 ml     Physical Exam: CVP 20 General: NAD. Sitting in the chair.     HEENT: Normal Neck: JVP to jaw with prominent CV waves. Carotids 2+ bilat; no bruits. No thyromegaly or nodule noted.  Cor: PMI nondisplaced. Distant, Regular. No M/G/R noted.   Lungs: Diminished basilar sounds.  Abdomen: Morbidly obese, soft, NT, ND, no HSM. No bruits or masses. +BS  Extremities: No cyanosis, clubbing, rash. R and LLE unna boots. LUE PICC. Trace edema Neuro: Alert & orientedx3, cranial nerves grossly intact. moves all 4 extremities w/o difficulty. Affect pleasant  Tele: Reviewed, NSR with PVCs  and PACs.   Labs: CBC No results for input(s): WBC, NEUTROABS, HGB, HCT, MCV, PLT in the last 72 hours. Basic Metabolic Panel  Recent Labs  10/31/16 0535 11/01/16 0535 11/01/16 0812  NA 130* 130*  --   K 3.1* 3.3*  --   CL 84* 82*  --   CO2 33* 35*  --   GLUCOSE 212* 242*  --   BUN 81* 86*  --   CREATININE 1.76* 1.70*  --   CALCIUM 9.5 9.5  --   MG  --   --  2.6*   Liver Function Tests No results for input(s): AST, ALT, ALKPHOS, BILITOT, PROT, ALBUMIN in the last 72 hours. No results for input(s): LIPASE, AMYLASE in the last 72 hours. Cardiac Enzymes No results for input(s): CKTOTAL, CKMB, CKMBINDEX, TROPONINI in the last 72 hours.  BNP: BNP (last 3 results)  Recent Labs  08/25/16 2119 09/26/16 1442 10/16/16 1512  BNP 1,359.9* 1,058.8* 903.7*    ProBNP (last 3 results) No results for input(s): PROBNP in the last 8760 hours.   D-Dimer No results for input(s): DDIMER in the last 72 hours. Hemoglobin A1C No results for input(s): HGBA1C in the last 72 hours. Fasting  Lipid Panel No results for input(s): CHOL, HDL, LDLCALC, TRIG, CHOLHDL, LDLDIRECT in the last 72 hours. Thyroid Function Tests No results for input(s): TSH, T4TOTAL, T3FREE, THYROIDAB in the last 72 hours.  Invalid input(s): FREET3  Other results:     Imaging/Studies:  No results found.  Latest Echo  Latest Cath   Medications:     Scheduled Medications: . aspirin EC  81 mg Oral Daily  . enoxaparin (LOVENOX) injection  40 mg Subcutaneous Q24H  . gabapentin  300 mg Oral TID  . insulin aspart  0-15 Units Subcutaneous TID WC  . insulin glargine  20 Units Subcutaneous Daily  . potassium chloride  40 mEq Oral BID  . potassium chloride  40 mEq Oral Once  . sodium chloride flush  3 mL Intravenous Q12H  . spironolactone  25 mg Oral Daily    Infusions: . DOPamine 2.5 mcg/kg/min (10/31/16 1127)  . furosemide (LASIX) infusion 20 mg/hr (10/31/16 2309)  . milrinone      PRN  Medications: sodium chloride, acetaminophen, ondansetron (ZOFRAN) IV, sodium chloride flush   Assessment/Plan   1. 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 - [facility] EF 30-35% RV normal.  - Echo on 12/7 with poor imaged LVEF probably in 35-40% range but severe RV failure and PAH - Coox 81% . Cut back milrinone to 0.125 mcg. Stop lasix drip. Start torsemide 60 mg twice a day.  Continue dopamine today. Stop in am.  - Currently off ARNI and spiro due to recent AKI and hyperkalemia.Marland Kitchen.  - Intolerant bidil with difficulty with headaches, dizziness and hypotension.  - No b-blocker with need for inotropes. - Low voltage QRS- SPEP no M spike.  UPEP.   - Continue Unna boots.  2. CAD s/p NSTEMI and CABG 10/12  - Denies CP. Stable on ASA and statin.  - [facility]  3. Morbid obesity - Urged to watch portion control and increase activity as able.  4. Pulmonary HTN - Mild to mod RHC 12/13/15. PVR 3.24   5. Acute on CKD stage III - Creatinine stable. BUN still going up.  6. DM2  - SSI while in house. 7. Hypokalemia - Continue K supp.  8. Acute gout - Continue oral prednisone.   CVP remains high but she is adamant she is going home tomorrow. Transition to po diuretics today. Cut back milrinone and continue dopamine.   Anticipate d/c in am. Will need [facility]. This was set up. She will need GOC discussion with HFSW at follow up. Also consider Palliative referral as an outpatient.   Length of Stay: 16  Tonye BecketAmy Clegg, NP  11/01/2016, 8:31 AM  Advanced Heart Failure Team Pager [phone] (M-F; 7a - 4p)  Please contact [facility] for night-coverage after hours (4p -7a ) and weekends on [email or web address]  Patient seen and examined with Tonye BecketAmy Clegg, NP. We discussed all aspects of the encounter. I agree with the assessment and plan as stated above.   She is symptomatically improved after nearly 50 pound  diuresis. Co-ox looks good. However CVP still 20 in setting of end-stage RV failure. Wants to go home tomorrow. Previous RHC suggestive of mostly pulmonary venous HTN. 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 I dont think there is a role for repeat HC at this point. Renal function stable.   Datrell Dunton,MD 8:55 AM

## 2016-11-01 NOTE — Progress Notes (Signed)
CARDIAC REHAB PHASE I   PRE:  Rate/Rhythm: 72 SR c/ PVCs  BP:  Sitting: 110/66        SaO2: 95 RA  MODE:  Ambulation: 720 ft   POST:  Rate/Rhythm: 82 SR c/ PVCs  BP:  Sitting: 124/80         SaO2: 93 RA   Pt ambulated 720 ft on RA, IV, gait belt, assist x1, steady gait, tolerated well with no complaints. Pt to recliner after walk, call bell within reach. Will follow.   [phone] Joylene GrapesEmily C Magon Croson, RN, BSN 11/01/2016 2:23 PM

## 2016-11-02 ENCOUNTER — Other Ambulatory Visit: Payer: Self-pay

## 2016-11-02 LAB — BASIC METABOLIC PANEL
Anion gap: 12 (ref 5–15)
BUN: 90 mg/dL — AB (ref 6–20)
CHLORIDE: 85 mmol/L — AB (ref 101–111)
CO2: 36 mmol/L — ABNORMAL HIGH (ref 22–32)
Calcium: 9.8 mg/dL (ref 8.9–10.3)
Creatinine, Ser: 1.59 mg/dL — ABNORMAL HIGH (ref 0.44–1.00)
GFR calc Af Amer: 38 mL/min — ABNORMAL LOW (ref >60)
GFR calc non Af Amer: 33 mL/min — ABNORMAL LOW (ref >60)
GLUCOSE: 181 mg/dL — AB (ref 65–99)
POTASSIUM: 3.6 mmol/L (ref 3.5–5.1)
Sodium: 133 mmol/L — ABNORMAL LOW (ref 135–145)

## 2016-11-02 LAB — COOXEMETRY PANEL
Carboxyhemoglobin: 1.3 % (ref 0.5–1.5)
Methemoglobin: 0.9 % (ref 0.0–1.5)
O2 Saturation: 56.6 %
TOTAL HEMOGLOBIN: 13.4 g/dL (ref 12.0–16.0)

## 2016-11-02 LAB — GLUCOSE, CAPILLARY
Glucose-Capillary: 245 mg/dL — ABNORMAL HIGH (ref 65–99)
Glucose-Capillary: 244 mg/dL — ABNORMAL HIGH (ref 65–99)

## 2016-11-02 MED ORDER — SPIRONOLACTONE 25 MG PO TABS: 25.0000 mg | ORAL_TABLET | Freq: Every day | ORAL | 6 refills | Status: DC

## 2016-11-02 MED ORDER — INSULIN ASPART 100 UNIT/ML ~~LOC~~ SOLN: 0.0000 [IU] | Freq: Three times a day (TID) | SUBCUTANEOUS | 11 refills | Status: DC

## 2016-11-02 MED ORDER — INSULIN STARTER KIT- SYRINGES (ENGLISH): 1.0000 | Freq: Once | 1 refills | Status: AC

## 2016-11-02 MED ORDER — POTASSIUM CHLORIDE CRYS ER 20 MEQ PO TBCR: 40.0000 meq | EXTENDED_RELEASE_TABLET | Freq: Two times a day (BID) | ORAL | 7 refills | Status: DC

## 2016-11-02 MED ORDER — SPIRONOLACTONE 25 MG PO TABS: 25.0000 mg | ORAL_TABLET | Freq: Every day | ORAL | 5 refills | Status: DC

## 2016-11-02 MED ORDER — TORSEMIDE 20 MG PO TABS: 60.0000 mg | ORAL_TABLET | Freq: Two times a day (BID) | ORAL | 5 refills | Status: DC

## 2016-11-02 MED ORDER — INSULIN STARTER KIT- SYRINGES (ENGLISH): 1.0000 | Freq: Once | 0 refills | Status: DC

## 2016-11-02 MED ORDER — ACETAMINOPHEN 325 MG PO TABS: 650.0000 mg | ORAL_TABLET | ORAL | Status: DC | PRN

## 2016-11-02 MED ORDER — INSULIN GLARGINE 100 UNIT/ML ~~LOC~~ SOLN: 20.0000 [IU] | Freq: Every day | SUBCUTANEOUS | 11 refills | Status: DC

## 2016-11-02 MED ORDER — TORSEMIDE 20 MG PO TABS: 60.0000 mg | ORAL_TABLET | Freq: Two times a day (BID) | ORAL | 6 refills | Status: DC

## 2016-11-02 MED ORDER — POTASSIUM CHLORIDE CRYS ER 20 MEQ PO TBCR: 40.0000 meq | EXTENDED_RELEASE_TABLET | Freq: Two times a day (BID) | ORAL | 6 refills | Status: DC

## 2016-11-02 NOTE — Progress Notes (Signed)
PICC removed per order. Tip intact at 40 cm. Vaseline gauze applied. Pt. Tolerated procedure well. Infection precautions explained and taught back by patient.

## 2016-11-02 NOTE — Discharge Summary (Signed)
Advanced Heart Failure Team  Discharge Summary   Patient ID: Christine Knight MRN: [id number], DOB/AGE: 18-Sep-1951 65 y.o. Admit date: 10/16/2016 D/C date:     11/02/2016   Primary Discharge Diagnoses:  1. Acute on chronic systolic biventricular heart failure due to ICM  Secondary Discharge Diagnoses:  2. ICM EF 15-20% 3. CAD with hx NSTEMI and CABG 08/2011 4. Morbid obesity 5. Pulmonary HTN 6. DM-2 refusing insulin 7. Hypokalemia replaced 8. Acute gout.   Hospital Course:   Christine Knight is a 65y.o. female w/ PMHx significant for morbid obesity, systolic HF due to ICM (91/63 EF 25%; 07/2012 EF 35%; 03/18/13 EF 20-25%), CAD with h/o NSTEMI s/p CABG Oct 2012, pulmonary hypertension  and poorly controlled DM 2  She has had multiple admissions this year for recurrent HF with massive volume overload and R>>L heart failure. Last admit 10/15 - 09/08/16 diuresed with high dose IV lasix, metolazone, as well as milrinone to augment diuresis. Diamox also added. Pt diuresed 35 L and lost 44 lbs.Discharge weight 224 lbs.  Seen in the HF Clinic on 10/16/16 with recurrent volume overload with weight 258. Admitted for IV diuresis. Diuresis poor on IV lasix so milrinone started and also placed on metolazone and high-dose lasix gtt at 30/hr. Near the end of her hospital stay CVP remained up and was feeling poorly so renal-dose dopamine also added. Diuresed 47 pounds to 211. Creatinine stable in 1.5-1.7 range.CVP 13 on d/c. She was adamant on being discharged prior to the Christmas Holiday so [facility] arranged ad PICC removed.  Echo showed LVEF 35-40% with severe RV dysfunction. There was ongoing concern for possible cardiac amyloidosis with low voltage on ECG. But previous SPEP/UPEP negative and no LVH on echo. Patient not candidate for cMRI due to CKD.   Tele in hospital with MAT and dimunitive p waves but no clear AF so anti-coagulation not started. While in hospital also had acute gout and treated with  prednisone burst.   Discussions had with patient about severity of HF and that she was nearing end-stage. Will consider outpatient palliative care cosnult   She refuses home insulin, I explained it was very high with the medication and I would order.  She will check with her PCP as well.   Discharge Vitals: Blood pressure 100/64, pulse 68, temperature 97.6 F (36.4 C), temperature source Oral, resp. rate 17, height _0  (1.676 m), weight 211 lb 14.4 oz (96.1 kg), SpO2 100 %.   Exam Sitting in chair NAD HEENT: Normal Neck: JVP to ear. Carotids 2+ bilat; no bruits. No thyromegaly or nodule noted.  Cor: PMI nondisplaced. Distant, Irregular No M/G/R Lungs: Mildly diminished basilar sounds.  Abdomen: Morbidly obese, soft, NT, ND, no HSM. No bruits or masses. +BS  Extremities: no cyanosis, clubbing, rash. R and LLE trace edema  + unna boots LUE PICC.  Neuro: alert & orientedx3, cranial nerves grossly intact. moves all 4 extremities w/o difficulty. Affect pleasant   Labs: Lab Results  Component Value Date   WBC 4.6 10/24/2016   HGB 11.4 (L) 10/24/2016   HCT 35.4 (L) 10/24/2016   MCV 93.7 10/24/2016   PLT 228 10/24/2016     Recent Labs Lab 11/02/16 0531  NA 133*  K 3.6  CL 85*  CO2 36*  BUN 90*  CREATININE 1.59*  CALCIUM 9.8  GLUCOSE 181*   Lab Results  Component Value Date   CHOL 136 12/09/2015   HDL 26 (L) 12/09/2015   LDLCALC 94 12/09/2015  TRIG 82 12/09/2015   BNP (last 3 results)  Recent Labs  08/25/16 2119 09/26/16 1442 10/16/16 1512  BNP 1,359.9* 1,058.8* 903.7*    ProBNP (last 3 results) No results for input(s): PROBNP in the last 8760 hours.   Diagnostic Studies/Procedures   No results found.  Discharge Medications   Torsemide 60 bid Ecasa 81 daily Gabapentin 300 tid Atorva 40 daily Sliding scale insulin    Allergies as of 11/02/2016      Reactions   Lisinopril Nausea And Vomiting, Other (See Comments), Cough   Coughing, throwing  up, waking up coughing   Metformin And Related Anaphylaxis      Medication List    TAKE these medications   acetaminophen 325 MG tablet Commonly known as:  TYLENOL Take 2 tablets (650 mg total) by mouth every 4 (four) hours as needed for headache or mild pain. What changed:  medication strength  how much to take  when to take this  reasons to take this   aspirin EC 81 MG tablet Take 1 tablet (81 mg total) by mouth daily.   CENTRUM SILVER 50+WOMEN PO Take 1 tablet by mouth every morning.   gabapentin 300 MG capsule Commonly known as:  NEURONTIN Take 300 mg by mouth 3 (three) times daily.   insulin aspart 100 UNIT/ML injection Commonly known as:  novoLOG Inject 0-15 Units into the skin 3 (three) times daily with meals. cbg 150-200 5 UNITS OF SHORT ACTING INSULIN, 201-250 5 UNITS OF INSULIN 251-300 8 UNITS OF INSULIN 301-350 11 UNITS OF INSULIN 351-400 15 UNITS OF INSULIN GREATER THAN 400 CALL YOUR PRIMARY MD   insulin glargine 100 UNIT/ML injection Commonly known as:  LANTUS Inject 0.2 mLs (20 Units total) into the skin daily. Start taking on:  11/03/2016   insulin starter kit- syringes Misc 1 kit by Other route once.   potassium chloride SA 20 MEQ tablet Commonly known as:  K-DUR,KLOR-CON Take 2 tablets (40 mEq total) by mouth 2 (two) times daily.   spironolactone 25 MG tablet Commonly known as:  ALDACTONE Take 1 tablet (25 mg total) by mouth daily. Start taking on:  11/03/2016   torsemide 20 MG tablet Commonly known as:  DEMADEX Take 3 tablets (60 mg total) by mouth 2 (two) times daily.            Durable Medical Equipment        Start     Ordered   11/01/16 [phone]  Heart failure home health orders  (Heart failure home health orders / Face to face)  Once    Comments:  Heart Failure Follow-up Care:  Verify follow-up appointments per Patient Discharge Instructions. Confirm transportation arranged. Reconcile home medications with discharge medication  list. Remove discontinued medications from use. Assist patient/caregiver to manage medications using pill box. Reinforce low sodium food selection Assessments: Vital signs and oxygen saturation at each visit. Assess home environment for safety concerns, caregiver support and availability of low-sodium foods. Consult Education officer, museum, PT/OT, Dietitian, and CNA based on assessments. Perform comprehensive cardiopulmonary assessment. Notify MD for any change in condition or weight gain of 3 pounds in one day or 5 pounds in one week with symptoms. Daily Weights and Symptom Monitoring: Ensure patient has access to scales. Teach patient/caregiver to weigh daily before breakfast and after voiding using same scale and record.    Teach patient/caregiver to track weight and symptoms and when to notify Provider. Activity: Develop individualized activity plan with patient/caregiver.  RN and PT  Question Answer Comment  Heart Failure Follow-up Care Advanced Heart Failure (AHF) [facility] at [place] Visits Set up telemonitoring equipment to monitor daily vital signs, weights and oxygen saturation   Obtain the following labs Basic Metabolic Panel   Lab frequency Other see comments   Fax lab results to Other see comments   Diet Low Sodium Heart Healthy   Fluid restrictions: 2000 mL Fluid   Initiate Heart Failure Clinic Diuretic Protocol to be used by [place] only ( to be ordered by Heart Failure Team Providers Only) Yes      11/01/16 0928      Disposition   The patient will be discharged in stable condition to home.  Follow-up Information    [facility] Follow up.   Why:  Registered Nurse, Physical Therapy, Education officer, museum.  Contact information: [address] [place] [address] [place], [place] Follow up on 11/14/2016.   Specialty:  Cardiology Why:  at 10:40 Garage Code 5000 Contact information: [address] [address] [phone]            Weigh daily Call HF clinic if weight climbs more than 3 pounds in a day or 5 pounds in a week. No salt to very little salt in your diet.  No more than 2000 mg in a day. Call if increased shortness of breath or increased swelling.  Monitor your glucose before meals and at bedtime.    Call your PCP today and discuss insulin.   Duration of Discharge Encounter: Greater than 35 minutes   Signed, Cecilie Kicks  11/02/2016, 2:15 PM   Agree with above.   Kimorah Ridolfi,MD 11:10 PM

## 2016-11-02 NOTE — Progress Notes (Signed)
Advanced Heart Failure Rounding Note  PCP: Arnette FeltsMike Duran  HF: Dr. Gala RomneyBensimhon   Subjective:    Admitted from clinic 10/16/16 with marked volume overload in setting of dietary indiscretion despite increase in diuretics.   Switched to po diuretics yesterday. Milrinone cut back. Remains on low dose dopamine. Feels much better. Wants to go home.  Co-ox 57% Renal function improved. Weight down to 211. (previous d/c weight 224)  CVP 13-14    Objective:   Weight Range: 96.1 kg (211 lb 14.4 oz) Body mass index is 34.2 kg/m.   Vital Signs:   Temp:  [97.5 F (36.4 C)-98.3 F (36.8 C)] 97.5 F (36.4 C) (12/23 0400) Pulse Rate:  [69-74] 69 (12/23 0400) Resp:  [15-19] 17 (12/23 0400) BP: (109-133)/(62-122) 109/74 (12/23 0400) SpO2:  [98 %-100 %] 100 % (12/23 0400) Weight:  [96.1 kg (211 lb 14.4 oz)] 96.1 kg (211 lb 14.4 oz) (12/23 0400) Last BM Date: 10/31/16  Weight change: Filed Weights   10/31/16 0429 11/01/16 0511 11/02/16 0400  Weight: 100.1 kg (220 lb 9.6 oz) 98.7 kg (217 lb 8 oz) 96.1 kg (211 lb 14.4 oz)    Intake/Output:   Intake/Output Summary (Last 24 hours) at 11/02/16 0640 Last data filed at 11/02/16 0400  Gross per 24 hour  Intake             1140 ml  Output             3400 ml  Net            -2260 ml     Physical Exam: CVP 13-14 General: NAD. Sitting in the chair.     HEENT: Normal Neck: JVP to jaw with prominent CV waves. Carotids 2+ bilat; no bruits. No thyromegaly or nodule noted.  Cor: PMI nondisplaced. Distant, Regular. No M/G/R noted.   Lungs: Diminished basilar sounds.  Abdomen: Morbidly obese, soft, NT, ND, no HSM. No bruits or masses. +BS  Extremities: No cyanosis, clubbing, rash. R and LLE unna boots. LUE PICC. Trace edema Neuro: Alert & orientedx3, cranial nerves grossly intact. moves all 4 extremities w/o difficulty. Affect pleasant  Tele: Reviewed, NSR with PVCs and PACs.   Labs: CBC No results for input(s): WBC, NEUTROABS, HGB, HCT, MCV,  PLT in the last 72 hours. Basic Metabolic Panel  Recent Labs  11/01/16 0535 11/01/16 0812 11/02/16 0531  NA 130*  --  133*  K 3.3*  --  3.6  CL 82*  --  85*  CO2 35*  --  36*  GLUCOSE 242*  --  181*  BUN 86*  --  90*  CREATININE 1.70*  --  1.59*  CALCIUM 9.5  --  9.8  MG  --  2.6*  --    Liver Function Tests No results for input(s): AST, ALT, ALKPHOS, BILITOT, PROT, ALBUMIN in the last 72 hours. No results for input(s): LIPASE, AMYLASE in the last 72 hours. Cardiac Enzymes No results for input(s): CKTOTAL, CKMB, CKMBINDEX, TROPONINI in the last 72 hours.  BNP: BNP (last 3 results)  Recent Labs  08/25/16 2119 09/26/16 1442 10/16/16 1512  BNP 1,359.9* 1,058.8* 903.7*    ProBNP (last 3 results) No results for input(s): PROBNP in the last 8760 hours.   D-Dimer No results for input(s): DDIMER in the last 72 hours. Hemoglobin A1C No results for input(s): HGBA1C in the last 72 hours. Fasting Lipid Panel No results for input(s): CHOL, HDL, LDLCALC, TRIG, CHOLHDL, LDLDIRECT in the last 72  hours. Thyroid Function Tests No results for input(s): TSH, T4TOTAL, T3FREE, THYROIDAB in the last 72 hours.  Invalid input(s): FREET3  Other results:     Imaging/Studies:  No results found.  Latest Echo  Latest Cath   Medications:     Scheduled Medications: . aspirin EC  81 mg Oral Daily  . enoxaparin (LOVENOX) injection  40 mg Subcutaneous Q24H  . gabapentin  300 mg Oral TID  . insulin aspart  0-15 Units Subcutaneous TID WC  . insulin glargine  20 Units Subcutaneous Daily  . potassium chloride  40 mEq Oral BID  . sodium chloride flush  3 mL Intravenous Q12H  . spironolactone  25 mg Oral Daily  . torsemide  60 mg Oral BID    Infusions: . DOPamine 2.5 mcg/kg/min (10/31/16 1127)  . milrinone 0.125 mcg/kg/min (11/01/16 0846)    PRN Medications: sodium chloride, acetaminophen, ondansetron (ZOFRAN) IV, sodium chloride flush   Assessment/Plan   1.  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 - [facility]- EF 30-35% RV normal.  - Echo on 12/7 with poor imaged LVEF probably in 35-40% range but severe RV failure and PAH - Currently off ARNI and spiro due to recent AKI and hyperkalemia.Marland Kitchen.  - Intolerant bidil with difficulty with headaches, dizziness and hypotension.  - No b-blocker with need for inotropes. - Low voltage QRS- SPEP no M spike.  UPEP.   - Continue Unna boots.  2. CAD s/p NSTEMI and CABG 10/12  - Denies CP. Stable on ASA and statin.  - Severe native [facility].  3. Morbid obesity - Urged to watch portion control and increase activity as able.  4. Pulmonary HTN - Mild to mod RHC 12/13/15. PVR 3.24   5. Acute on CKD stage III - Creatinine stable. B 6. DM2  - SSI while in house. 7. Hypokalemia - Continue K supp.  8. Acute gout - resolved with prednisone burst  She is improved. CVP still a bit high but with severe RVF may be as good as we can get her. She is down 13 pounds from previous d/c and is adamant she wants to go home today for the holiday. She has HHRN with [facility] set up.  Will pull PICC and d/c home with close f/u in HF Clinic. She will need GOC discussion with HFSW at follow up. Also consider Palliative referral as an outpatient.   Length of Stay: 17  Arvilla MeresBensimhon, Derian Dimalanta, MD  11/02/2016, 6:40 AM  Advanced Heart Failure Team Pager [phone] (M-F; 7a - 4p)  Please contact [facility] for night-coverage after hours (4p -7a ) and weekends on [email or web address]

## 2016-11-02 NOTE — Progress Notes (Signed)
Discharge instructions given to pt including how to administer regular and long acting insuline. All questions answered. Pres[facility] but since they will be closed until Tuesday 12/26, it was then changed to [facility] on [address]  Colleen Canesar Emmit Oriley, RN

## 2016-11-02 NOTE — Discharge Instructions (Signed)
Weigh daily Call HF clinic if weight climbs more than 3 pounds in a day or 5 pounds in a week. No salt to very little salt in your diet.  No more than 2000 mg in a day. Call if increased shortness of breath or increased swelling.  Monitor your glucose before meals and at bedtime.    Call your PCP today and discuss insulin.

## 2016-11-05 MED FILL — KLOR-CON M20 TABLET: 20 | 30 days supply | Qty: 140 | Fill #3

## 2016-11-06 ENCOUNTER — Other Ambulatory Visit (HOSPITAL_COMMUNITY): Payer: Self-pay | Admitting: *Deleted

## 2016-11-06 ENCOUNTER — Telehealth (HOSPITAL_COMMUNITY): Payer: Self-pay | Admitting: *Deleted

## 2016-11-06 ENCOUNTER — Encounter (HOSPITAL_COMMUNITY): Payer: Medicare Other

## 2016-11-06 MED ORDER — SPIRONOLACTONE 25 MG PO TABS: 25.0000 mg | ORAL_TABLET | Freq: Every day | ORAL | 5 refills | Status: AC

## 2016-11-06 MED ORDER — TORSEMIDE 20 MG PO TABS: 60.0000 mg | ORAL_TABLET | Freq: Two times a day (BID) | ORAL | 5 refills | Status: DC

## 2016-11-06 MED FILL — SPIRONOLACTONE 25 MG TABLET: 25 | 30 days supply | Qty: 30 | Fill #0

## 2016-11-06 MED FILL — TORSEMIDE 20 MG TABLET: 20 | 30 days supply | Qty: 180 | Fill #0

## 2016-11-06 NOTE — Telephone Encounter (Signed)
[facility] will get [facility] today, she states pt is still refusing to take Insulin and has an appt w/pcp soon to discuss

## 2016-11-06 NOTE — Telephone Encounter (Signed)
-----   Message from Leone BrandLaura R Ingold, NP sent at 11/02/2016  2:38 PM EST ----- She was discharged 11/02/16 but needs lab work on Tuesday, she is refusing to take insulin as well.  But she may need to be seen before 11/15/15.  Thanks. Vernona RiegerLaura

## 2016-11-08 ENCOUNTER — Encounter (HOSPITAL_COMMUNITY): Payer: Medicare Other

## 2016-11-12 MED FILL — glipiZIDE 10 MG TABS: 10 | 30 days supply | Qty: 60 | Fill #0

## 2016-11-13 ENCOUNTER — Encounter (HOSPITAL_COMMUNITY): Payer: Medicare Other

## 2016-11-14 ENCOUNTER — Telehealth (HOSPITAL_COMMUNITY): Payer: Self-pay

## 2016-11-14 ENCOUNTER — Ambulatory Visit (HOSPITAL_COMMUNITY)
Admission: RE | Admit: 2016-11-14 | Discharge: 2016-11-14 | Disposition: A | Discharge status: Home / Self Care | Payer: Medicare Other | Source: Ambulatory Visit | Attending: Internal Medicine | Admitting: Internal Medicine

## 2016-11-14 VITALS — BP 126/84 | HR 79 | Wt 211.4 lb

## 2016-11-14 DIAGNOSIS — Z794 Long term (current) use of insulin: Secondary | ICD-10-CM | POA: Diagnosis not present

## 2016-11-14 DIAGNOSIS — N183 Chronic kidney disease, stage 3 unspecified: Secondary | ICD-10-CM

## 2016-11-14 DIAGNOSIS — R29818 Other symptoms and signs involving the nervous system: Secondary | ICD-10-CM

## 2016-11-14 DIAGNOSIS — I252 Old myocardial infarction: Secondary | ICD-10-CM | POA: Insufficient documentation

## 2016-11-14 DIAGNOSIS — E1122 Type 2 diabetes mellitus with diabetic chronic kidney disease: Secondary | ICD-10-CM | POA: Insufficient documentation

## 2016-11-14 DIAGNOSIS — M109 Gout, unspecified: Secondary | ICD-10-CM | POA: Diagnosis not present

## 2016-11-14 DIAGNOSIS — Z951 Presence of aortocoronary bypass graft: Secondary | ICD-10-CM | POA: Insufficient documentation

## 2016-11-14 DIAGNOSIS — Z7982 Long term (current) use of aspirin: Secondary | ICD-10-CM | POA: Diagnosis not present

## 2016-11-14 DIAGNOSIS — I130 Hypertensive heart and chronic kidney disease with heart failure and stage 1 through stage 4 chronic kidney disease, or unspecified chronic kidney disease: Secondary | ICD-10-CM | POA: Diagnosis not present

## 2016-11-14 DIAGNOSIS — I5022 Chronic systolic (congestive) heart failure: Secondary | ICD-10-CM | POA: Diagnosis not present

## 2016-11-14 DIAGNOSIS — I509 Heart failure, unspecified: Secondary | ICD-10-CM | POA: Diagnosis present

## 2016-11-14 DIAGNOSIS — G4730 Sleep apnea, unspecified: Secondary | ICD-10-CM

## 2016-11-14 DIAGNOSIS — I255 Ischemic cardiomyopathy: Secondary | ICD-10-CM | POA: Diagnosis not present

## 2016-11-14 DIAGNOSIS — I2510 Atherosclerotic heart disease of native coronary artery without angina pectoris: Secondary | ICD-10-CM | POA: Diagnosis not present

## 2016-11-14 DIAGNOSIS — I272 Pulmonary hypertension, unspecified: Secondary | ICD-10-CM | POA: Insufficient documentation

## 2016-11-14 DIAGNOSIS — Z79899 Other long term (current) drug therapy: Secondary | ICD-10-CM | POA: Insufficient documentation

## 2016-11-14 DIAGNOSIS — Z9889 Other specified postprocedural states: Secondary | ICD-10-CM | POA: Diagnosis not present

## 2016-11-14 MED FILL — COLCRYS 0.6 MG TABLET: 0.6 | 5 days supply | Qty: 5 | Fill #0

## 2016-11-14 NOTE — Progress Notes (Signed)
Patient ID: Tomoko Sandra, female   DOB: 1951/03/07, 66 y.o.   MRN: [id number]     Advanced Heart Failure Clinic Note   PCP: Roque Cash  HF: Dr. Haroldine Laws   HPI: Ms.Troupe is a 66 y.o. female w/ PMHx significant for morbid obesity, systolic HF due to ICM (61/44 EF 25%; 07/2012 EF 35%; 03/18/13 EF 20-25%), CAD with h/o NSTEMI s/p CABG Oct 2012, pulmonary hypertension (PA peak 51mHg), and poorly controlled DM 2.  [facility] edema, and weight gain.  Lasix gtt used and she diuresed 15 liters. Diuresed 29 pounds.  ECHO- EF 20-25%.  Discharge weight 264 pounds.    Admitted 12/08/2015 - 12/28/2015 with worsening DOE and cough. Stated she had a 40 lb weight since 07/2015. She was diuresed aggressively with lasix gtt at 30 mg/hr and metolazone 5 mg BID. Diamox eventually added. Diuretics held temporarily towards end of stay with worsening Creatinine. Overall she diuresed >40 L and was down 70 lbs from admission weight. Pt had R/LHC with preserved CO and mild/mod Pulm HTN. Severe native CAD with patent CABG grafts. Discharge weight 237 lbs.   Admitted 6/1 -04/26/2016 with marked volume overload, low output hf, cardiorenal syndrome. Diuresed with high dose lasix 160 mg three times a day. Diuresed over 50 pounds. Transitioned to torsemide 80 mg daily. BB and losartan stopped. Discharge weight 222 pounds   Admitted 10/15 - 09/08/16 with marked volume overload. PT diuresed with high dose IV lasix, metolazone, as well as Milrinone to augment diuresis. Diamox also added.  Pt diuresed 35 L and lost 44 lbs. Pt transitioned back to po torsemide, and had to be adjusted further with AKI.  Discharge weight 224 lbs.  Admitted  12/6 through 11/02/16 with marked volume overload. Diuresed with IV lasix, metolazone, and milrinone. Overall diuresed 41 pounds. Discharged on torsemide 60 mg twice a day. Discharge weight was 211 pounds.   She presents for post hospital follow up. Overall  feeling good. Currently being treated for gout and arthritis by PCP. Had labs 2 days ago at PCP.  Mild dyspnea with exertion. Denies PND/Orthopnea. Weight at home 201-206 pounds. [facility] following for tele monitoring.  Taking all medications.   Labs 2/17 K 4.3, Cr 1.56 Labs 04/26/2016: K 3.1 Creatinine 1.78 K 3.1 Labs 06/05/2016: K 4.3 Creatinine 1.25 Labas 06/13/2016: K 4.0 Creatinine 1.49  Labs 07/31/2016: K 4.5 Creatinine 2.27  Labs  08/05/2016: K 5.0 Creatinine 3.18  11/02/2017: K 3.6 Creatinine 1.59   03/18/13 ECHO EF 20-25% 06/17/2013 ECHO EF 35% RV ok (read formally after visit 35-40%) 03/15/2015 ECHO EF 30-35%, grade 3 DD, Moderate TR, Severely reduced RV, PA peak pressure 81 mm Hg 12/09/2015 ECHO EF 15-20%, Decreased LV diastolic compliance, RV mod reduced, PA peak 57 mmHg, Severe TR  04/14/16 Echo 15-20%  10/2016: EF Peak PA pressure 89 mm hg  EF 35-40% RV dysfunction.    [facility] RA 25/25 (23) RV pressure 92/13  RV EDP 26 PA pressure 86/34 (56) PW mean 37 AO Pressure 114/72 (88) LV Pressure 101/12 (26) LV EDP 26 PVR 3.24 Fick CO/CI 5.85 / 2.41L/min   Prox LAD lesion, 100% stenosed. Prox Cx lesion, 99% stenosed. Mid RCA lesion, 80% stenosed. Dist RCA lesion, 100% stenosed. Patent grafts  ROS: All systems negative except as listed in HPI, PMH and Problem List.  Past Medical History:  Diagnosis Date  . CAD (coronary artery disease) 08/2011   NSTEMI with subsequent CABG  08/13/2011 with LIMA-LAD, SVG-diagonal, SVG-OM, SVG-PDA  . Carpal tunnel syndrome on both sides    "post OHS in 08/2011; resolved now" (03/17/2013)  . CHF (congestive heart failure) ([place])   . Chronic systolic heart failure (HCC)    Chronic systolic CHF  . Diabetes mellitus type 2 in obese ([place]) 08/10/11  . Dyspnea   . Ejection fraction < 50%    EF 20%, October, 2012  //  EF 25% December, 2012  //   EF 35%, echo, September, 2013  . Hyperlipidemia 08/10/11  . Hypertension   . Ischemic  cardiomyopathy 08/13/2011   EF 20% in 08/2011, still 25% 10/21/2011  . Mitral regurgitation    Mild by echo 10/21/11  . Myocardial infarction 08/2011  . Obesity, morbid ([place])   . Orthopnoea    "progressively worse over last 3 wks" (03/17/2013)  . Pleural effusion    Requiring L thoracentesis 09/02/11  . Pulmonary hypertension    Echo, September, 2013, 72 mmHg.    Current Outpatient Prescriptions  Medication Sig Dispense Refill  . acetaminophen (TYLENOL) 325 MG tablet Take 2 tablets (650 mg total) by mouth every 4 (four) hours as needed for headache or mild pain.    Marland Kitchen aspirin 81 MG tablet Take 1 tablet (81 mg total) by mouth daily. 30 tablet 9  . gabapentin (NEURONTIN) 300 MG capsule Take 300 mg by mouth 3 (three) times daily.     . insulin aspart (NOVOLOG) 100 UNIT/ML injection Inject 0-15 Units into the skin 3 (three) times daily with meals. cbg 150-200 5 UNITS OF SHORT ACTING INSULIN, 201-250 5 UNITS OF INSULIN 251-300 8 UNITS OF INSULIN 301-350 11 UNITS OF INSULIN 351-400 15 UNITS OF INSULIN GREATER THAN 400 CALL YOUR PRIMARY MD 10 mL 11  . insulin glargine (LANTUS) 100 UNIT/ML injection Inject 0.2 mLs (20 Units total) into the skin daily. 10 mL 11  . Multiple Vitamins-Minerals (CENTRUM SILVER 50+WOMEN PO) Take 1 tablet by mouth every morning.    . potassium chloride SA (K-DUR,KLOR-CON) 20 MEQ tablet Take 2 tablets (40 mEq total) by mouth 2 (two) times daily. 60 tablet 7  . spironolactone (ALDACTONE) 25 MG tablet Take 1 tablet (25 mg total) by mouth daily. 30 tablet 5  . torsemide (DEMADEX) 20 MG tablet Take 3 tablets (60 mg total) by mouth 2 (two) times daily. 180 tablet 5   No current facility-administered medications for this encounter.      PHYSICAL EXAM: Vitals:   11/14/16 1034  BP: 126/84  BP Location: Left Arm  Patient Position: Sitting  Cuff Size: Normal  Pulse: 79  SpO2: 91%  Weight: 211 lb 6.4 oz (95.9 kg)   Wt Readings from Last 3 Encounters:  11/14/16 211 lb  6.4 oz (95.9 kg)  11/02/16 211 lb 14.4 oz (96.1 kg)  10/16/16 262 lb 2 oz (118.9 kg)     General: NAD  Ambulated without difficulty. HEENT: Normal Neck: JVP 6-7. Carotids 2+ bilat; no bruits. No thyromegaly or nodule noted.  Cor: PMI nondisplaced. Distant, Irregular  No M/G/R Lungs: Mildly diminished basilar sounds.  Abdomen: Morbidly obese, soft, NT, ND, no HSM. No bruits or masses. +BS  Extremities: no cyanosis, clubbing, rash. R and LLE trace edema. Compression stockings.    Neuro: alert & orientedx3, cranial nerves grossly intact. moves all 4 extremities w/o difficulty. Affect pleasant    ASSESSMENT/PLAN 1. Acute/Chronic systolic HF due to iCM EF 15-20% Echo 04/14/2016. - ECHO 10/2016 EF 35-40% RV dysfunction.   -  NYHA II. Volume status stable. -Continue torsemide 60 mg twicea day + 40 meq potassium twice a day.  - No bb with low output.  - Continue spiro 25 mg daily - She is not on an ace due to allergy. She has been on entresto in the past but developed hyperkalemia and AKI.  - No dig with CKD.  - Intolerant bidil with difficulty with headaches, dizziness and hypotension.  Reinforced low salt diet, daily weilghts, and limiting fluid intake to < 2 liters per day.  2. CAD s/p NSTEMI and CABG 10/12  - Severe native CAD with patent grafts on LHC 12/13/15. Continue ASA 81 mg.  Today I will review lab work from PCP. If LFTs ok will restart atorvastatin  40 mg daily.  3. Morbid obesity- encouraged to cut back portions.  4. Pulmonary HTN - mild to mod RHC 12/13/15. PVR 3.24.  5. CKD stage III- I have contacted PCP for BMET results.  6. DM2 - Per PCP 7. Suspected sleep apnea. Day time fatigue- Set up sleep study  8. Gout- This is being managed by her PCP.    Today she met with Paramedicine and HFSW. Continue [facility] tele monitoring and diuretic protocol.  Follow up every 1 week for 4 weeks.      Guelda Batson Ninfa Meeker, NP-C 10:32 AM

## 2016-11-14 NOTE — Telephone Encounter (Signed)
[facility]. Lab work not yet reviewed by ordering provider at that facility, however, potassium high at 6.3 Per [name] NP-C, patient needs to stop spironolactone and potassium and have repeat potassium level STAT. Attempted all numbers on patients chart, no answer, voicemails left with message to stop medications and recheck this level at nearest urgent care/ED. Will attempt again tomorrow morning when I return to clinic.  Christine FilterBradley, Christine Genevea, RN

## 2016-11-14 NOTE — Progress Notes (Signed)
CSW met with patient in the clinic along with [facility] paramedic. Patient reports she is doing ok with her Heart Failure but states having a terrible time with her gout. She spoke of her feet and knees and ways she manages until it clears. Patient reports she continues to live with her sister but hopeful to obtain independent housing at some point. Patient requested information on available housing in [place]. CSW provided patient with a list of available housing options. Patient grateful for assistance and will reach out to CSW if further needs with housing. CSW will continue to coordinate care with paramedicine program. Raquel Sarna, LCSW [phone]

## 2016-11-14 NOTE — Patient Instructions (Signed)
Will schedule [facility] study [facility]. Address: [address], [place], [place] [address]  Phone: [phone]  Follow up once weekly with heart failure clinic for 4 weeks.

## 2016-11-15 ENCOUNTER — Telehealth (HOSPITAL_COMMUNITY): Payer: Self-pay

## 2016-11-15 NOTE — Addendum Note (Signed)
Addended by: Modesta MessingBROWN, Delois Tolbert Q on: 11/15/2016 03:43 PM   Modules accepted: Orders

## 2016-11-15 NOTE — Telephone Encounter (Signed)
Spoke with patient and advised her to stop all Potassium and to go the nearest urgent care/ED to have potassium rechecked.  Patient aware and agreeable

## 2016-11-15 NOTE — Telephone Encounter (Signed)
2nd attempt to reach patient and emergency contacts by phone.  No answer, voicemails left to call back asap for critical lab value and need to recheck today. Potassium 6.3  Ave FilterBradley, Saron Vanorman Genevea, RN

## 2016-11-18 MED FILL — GABAPENTIN 300 MG CAPSULE: 300 | 20 days supply | Qty: 60 | Fill #2

## 2016-11-21 ENCOUNTER — Ambulatory Visit (HOSPITAL_COMMUNITY)
Admission: RE | Admit: 2016-11-21 | Discharge: 2016-11-21 | Disposition: A | Discharge status: Home / Self Care | Payer: Medicare Other | Source: Ambulatory Visit | Attending: Cardiology | Admitting: Cardiology

## 2016-11-21 VITALS — BP 132/90 | HR 108 | Wt 214.4 lb

## 2016-11-21 DIAGNOSIS — M109 Gout, unspecified: Secondary | ICD-10-CM | POA: Insufficient documentation

## 2016-11-21 DIAGNOSIS — Z7982 Long term (current) use of aspirin: Secondary | ICD-10-CM | POA: Diagnosis not present

## 2016-11-21 DIAGNOSIS — R5383 Other fatigue: Secondary | ICD-10-CM | POA: Diagnosis not present

## 2016-11-21 DIAGNOSIS — Z79899 Other long term (current) drug therapy: Secondary | ICD-10-CM | POA: Diagnosis not present

## 2016-11-21 DIAGNOSIS — Z9889 Other specified postprocedural states: Secondary | ICD-10-CM | POA: Diagnosis not present

## 2016-11-21 DIAGNOSIS — I2510 Atherosclerotic heart disease of native coronary artery without angina pectoris: Secondary | ICD-10-CM | POA: Insufficient documentation

## 2016-11-21 DIAGNOSIS — N183 Chronic kidney disease, stage 3 unspecified: Secondary | ICD-10-CM

## 2016-11-21 DIAGNOSIS — I252 Old myocardial infarction: Secondary | ICD-10-CM | POA: Insufficient documentation

## 2016-11-21 DIAGNOSIS — R29818 Other symptoms and signs involving the nervous system: Secondary | ICD-10-CM | POA: Insufficient documentation

## 2016-11-21 DIAGNOSIS — I130 Hypertensive heart and chronic kidney disease with heart failure and stage 1 through stage 4 chronic kidney disease, or unspecified chronic kidney disease: Secondary | ICD-10-CM | POA: Insufficient documentation

## 2016-11-21 DIAGNOSIS — G4730 Sleep apnea, unspecified: Secondary | ICD-10-CM

## 2016-11-21 DIAGNOSIS — I5022 Chronic systolic (congestive) heart failure: Secondary | ICD-10-CM | POA: Diagnosis not present

## 2016-11-21 DIAGNOSIS — Z951 Presence of aortocoronary bypass graft: Secondary | ICD-10-CM | POA: Insufficient documentation

## 2016-11-21 DIAGNOSIS — E785 Hyperlipidemia, unspecified: Secondary | ICD-10-CM

## 2016-11-21 DIAGNOSIS — I255 Ischemic cardiomyopathy: Secondary | ICD-10-CM | POA: Insufficient documentation

## 2016-11-21 DIAGNOSIS — I5023 Acute on chronic systolic (congestive) heart failure: Secondary | ICD-10-CM | POA: Diagnosis present

## 2016-11-21 DIAGNOSIS — I272 Pulmonary hypertension, unspecified: Secondary | ICD-10-CM | POA: Diagnosis not present

## 2016-11-21 DIAGNOSIS — E875 Hyperkalemia: Secondary | ICD-10-CM | POA: Diagnosis not present

## 2016-11-21 DIAGNOSIS — E1122 Type 2 diabetes mellitus with diabetic chronic kidney disease: Secondary | ICD-10-CM | POA: Insufficient documentation

## 2016-11-21 LAB — COMPREHENSIVE METABOLIC PANEL
ALK PHOS: 120 U/L (ref 38–126)
ALT: 14 U/L (ref 14–54)
AST: 30 U/L (ref 15–41)
Albumin: 3.8 g/dL (ref 3.5–5.0)
Anion gap: 10 (ref 5–15)
BUN: 45 mg/dL — AB (ref 6–20)
CALCIUM: 10.1 mg/dL (ref 8.9–10.3)
CHLORIDE: 100 mmol/L — AB (ref 101–111)
CO2: 24 mmol/L (ref 22–32)
CREATININE: 1.34 mg/dL — AB (ref 0.44–1.00)
GFR, EST AFRICAN AMERICAN: 47 mL/min — AB (ref >60)
GFR, EST NON AFRICAN AMERICAN: 41 mL/min — AB (ref >60)
Glucose, Bld: 70 mg/dL (ref 65–99)
Potassium: 3.7 mmol/L (ref 3.5–5.1)
SODIUM: 134 mmol/L — AB (ref 135–145)
Total Bilirubin: 1.5 mg/dL — ABNORMAL HIGH (ref 0.3–1.2)
Total Protein: 9.4 g/dL — ABNORMAL HIGH (ref 6.5–8.1)

## 2016-11-21 LAB — BRAIN NATRIURETIC PEPTIDE: B NATRIURETIC PEPTIDE 5: 1037.5 pg/mL — AB (ref 0.0–100.0)

## 2016-11-21 NOTE — Progress Notes (Signed)
SW Intern met with paramedicine pt. Pt appeared knowledgeable about her health, understood the importance of proper nutrition and weighing and was overall in pleasant spirits. SW Intern provided pt with  CSW contact information and pt indicated she would contact if she ran into any trouble getting her medications. SW will continue to coordinate with paramedicine and be available as needed. SW Intern [place], [place], [place]

## 2016-11-21 NOTE — Progress Notes (Signed)
Patient ID: Christine Knight, female   DOB: 1951-09-29, 66 y.o.   MRN: [id number]     Advanced Heart Failure Clinic Note   PCP: Arnette Felts  HF: Dr. Gala Romney   HPI: Christine Knight is a 66 y.o. female w/ PMHx significant for morbid obesity, systolic HF due to ICM (10/12 EF 25%; 07/2012 EF 35%; 03/18/13 EF 20-25%), CAD with h/o NSTEMI s/p CABG Oct 2012, pulmonary hypertension (PA peak ), and poorly controlled DM 2.  She was admitted to [facility] in 2014 for dyspnea, leg edema, and weight gain.  Lasix gtt used and she diuresed 15 liters. Diuresed 29 pounds.  ECHO- EF 20-25%.  Discharge weight 264 pounds.    Admitted 12/08/2015 - 12/28/2015 with worsening DOE and cough. Stated she had a 40 lb weight since 07/2015. She was diuresed aggressively with lasix gtt at 30 mg/hr and metolazone 5 mg BID. Diamox eventually added. Diuretics held temporarily towards end of stay with worsening Creatinine. Overall she diuresed >40 L and was down 70 lbs from admission weight. Pt had R/LHC with preserved CO and mild/mod Pulm HTN. Severe native CAD with patent CABG grafts. Discharge weight 237 lbs.   Admitted 6/1 -04/26/2016 with marked volume overload, low output hf, cardiorenal syndrome. Diuresed with high dose lasix 160 mg three times a day. Diuresed over 50 pounds. Transitioned to torsemide 80 mg daily. BB and losartan stopped. Discharge weight 222 pounds   Admitted 10/15 - 09/08/16 with marked volume overload. PT diuresed with high dose IV lasix, metolazone, as well as Milrinone to augment diuresis. Diamox also added.  Pt diuresed 35 L and lost 44 lbs. Pt transitioned back to po torsemide, and had to be adjusted further with AKI.  Discharge weight 224 lbs.  Admitted  12/6 through 11/02/16 with marked volume overload. Diuresed with IV lasix, metolazone, and milrinone. Overall diuresed 41 pounds. Discharged on torsemide 60 mg twice a day. Discharge weight was 211 pounds.   She presents today for 2/4 weekly follow ups s/p  recent hospitalization. Weight up 3 lbs from las visit. Weight at home 210, up slightly from last week, but similar to her discharge weight. Breathing has been good. No SOB getting around the house. No SOB walking around [employer] or walking into clinic. No SOB or PND. Sleeping fine flat some nights. [facility] following for tele-monitoring. Sleep study on 12/25/16. [facility] following for tele-monitoring. Paramedicine following as well. Taking or all medications as directed. Occasional dizziness when sugar gets low.   Potassium stopped with K 6.3 from [facility]. Pt stopped K, still taking spiro.   ROS: All systems negative except as above, in PMH, and in Problem list.   Labs 2/17 K 4.3, Cr 1.56 Labs 04/26/2016: K 3.1 Creatinine 1.78 K 3.1 Labs 06/05/2016: K 4.3 Creatinine 1.25 Labas 06/13/2016: K 4.0 Creatinine 1.49  Labs 07/31/2016: K 4.5 Creatinine 2.27  Labs  08/05/2016: K 5.0 Creatinine 3.18  11/02/2017: K 3.6 Creatinine 1.59   03/18/13 ECHO EF 20-25% 06/17/2013 ECHO EF 35% RV ok (read formally after visit 35-40%) 03/15/2015 ECHO EF 30-35%, grade 3 DD, Moderate TR, Severely reduced RV, PA peak pressure 81 mm Hg 12/09/2015 ECHO EF 15-20%, Decreased LV diastolic compliance, RV mod reduced, PA peak 57 mmHg, Severe TR  04/14/16 Echo 15-20%  10/2016: EF Peak PA pressure 89 mm hg  EF 35-40% RV dysfunction.    R/LHC 12/13/15 Hemodynamics RA 25/25 (23) RV pressure 92/13  RV EDP 26 PA pressure 86/34 (56) PW mean 37 AO  Pressure 114/72 (88) LV Pressure 101/12 (26) LV EDP 26 PVR 3.24 Fick CO/CI 5.85 / 2.41L/min   Prox LAD lesion, 100% stenosed. Prox Cx lesion, 99% stenosed. Mid RCA lesion, 80% stenosed. Dist RCA lesion, 100% stenosed. Patent grafts    Past Medical History:  Diagnosis Date  . CAD (coronary artery disease) 08/2011   NSTEMI with subsequent CABG 08/13/2011 with LIMA-LAD, SVG-diagonal, SVG-OM, SVG-PDA  . Carpal tunnel syndrome on both sides    "post OHS in 08/2011; resolved now"  (03/17/2013)  . CHF (congestive heart failure) (HCC)   . Chronic systolic heart failure (HCC)    Chronic systolic CHF  . Diabetes mellitus type 2 in obese (HCC) 08/10/11  . Dyspnea   . Ejection fraction < 50%    EF 20%, October, 2012  //  EF 25% December, 2012  //   EF 35%, echo, September, 2013  . Hyperlipidemia 08/10/11  . Hypertension   . Ischemic cardiomyopathy 08/13/2011   EF 20% in 08/2011, still 25% 10/21/2011  . Mitral regurgitation    Mild by echo 10/21/11  . Myocardial infarction 08/2011  . Obesity, morbid (HCC)   . Orthopnoea    "progressively worse over last 3 wks" (03/17/2013)  . Pleural effusion    Requiring L thoracentesis 09/02/11  . Pulmonary hypertension    Echo, September, 2013, 72 mmHg.    Current Outpatient Prescriptions  Medication Sig Dispense Refill  . acetaminophen (TYLENOL) 325 MG tablet Take 2 tablets (650 mg total) by mouth every 4 (four) hours as needed for headache or mild pain.    Marland Kitchen aspirin 81 MG tablet Take 1 tablet (81 mg total) by mouth daily. 30 tablet 9  . gabapentin (NEURONTIN) 300 MG capsule Take 300 mg by mouth 3 (three) times daily.     Marland Kitchen glipiZIDE (GLUCOTROL) 10 MG tablet Take 10 mg by mouth 2 (two) times daily.    . Multiple Vitamins-Minerals (CENTRUM SILVER 50+WOMEN PO) Take 1 tablet by mouth every morning.    Marland Kitchen spironolactone (ALDACTONE) 25 MG tablet Take 1 tablet (25 mg total) by mouth daily. 30 tablet 5  . torsemide (DEMADEX) 20 MG tablet Take 3 tablets (60 mg total) by mouth 2 (two) times daily. 180 tablet 5   No current facility-administered medications for this encounter.      PHYSICAL EXAM: Vitals:   11/21/16 1501  BP: 132/90  BP Location: Left Arm  Patient Position: Sitting  Cuff Size: Normal  Pulse: (!) 108  SpO2: 96%  Weight: 214 lb 6.4 oz (97.3 kg)   Wt Readings from Last 3 Encounters:  11/21/16 214 lb 6.4 oz (97.3 kg)  11/14/16 211 lb 6.4 oz (95.9 kg)  11/02/16 211 lb 14.4 oz (96.1 kg)     General: NAD  Ambulated  without difficulty. HEENT: Normal Neck: JVP 6-7. Carotids 2+ bilat; no bruits. No thyromegaly or nodule noted.  Cor: PMI nondisplaced. Distant, Irregular  No M/G/R Lungs: Mildly diminished basilar sounds.  Abdomen: Morbidly obese, soft, NT, ND, no HSM. No bruits or masses. +BS  Extremities: no cyanosis, clubbing, rash. R and LLE trace edema. Compression stockings.    Neuro: alert & orientedx3, cranial nerves grossly intact. moves all 4 extremities w/o difficulty. Affect pleasant   ASSESSMENT/PLAN 1. Acute/Chronic systolic HF due to iCM EF 15-20% Echo 04/14/2016. - ECHO 10/2016 EF 35-40% RV dysfunction.   - NYHA class II. Volume status relatively stable.  - Continue torsemide 60 mg BID.  - Potassium stopped with K 6.3 from  [facility]. Pt has not been able to have repeat labs. Will repeat stat today. - No bb with low output.  - Continue spiro 25 mg daily. She has still been taking. Told her to hold her am dose until she hears back about labs. - No ACE due to allergy - No Entresto due to h/o of AKI and Hyper K on same.  - No dig with CKD.  - Intolerant bidil with difficulty with headaches, dizziness and hypotension.  - Reinforced fluid restriction to < 2 L daily, sodium restriction to less than 2000 mg daily, and the importance of daily weights.  2. CAD s/p NSTEMI and CABG 10/12  - Severe native CAD with patent grafts on LHC 12/13/15. Continue ASA 81 mg.  - CMET today. If LFTs ok can restart atorvastatin 40 mg daily.  3. Morbid obesity - Needs to watch portion and increase activity as able.   4. Pulmonary HTN - mild to mod RHC 12/13/15. PVR 3.24.  5. CKD stage III - Stat BMET today.  6. DM2 - Per PCP 7. Suspected sleep apnea. Day time fatigue-  -Sleep study has been arranged for 12/25/16 8. Gout - Per PCP  9. Hyperkalemia - Pt stopped K but continued spiro. - Repeat labs today.   No change to meds today. BP mildly elevated on arrival but has been very sensitive to  up-titration in the past. Volume status looks OK.   CMET and BNP today. Will give instructions on spiro based on labs. She has continued taking despite Hyperkalemia last week. Instructed to hold tomorrows dose until she hears from us about instructions moving forward.   Keep 1 week follow up for at least next 2 weeks.   Graciella FreerMichael Andrew Berton Butrick, PA-C 3:16 PM   Total time spent > 25 minutes. Over half that spent discussing the above.

## 2016-11-22 ENCOUNTER — Telehealth (HOSPITAL_COMMUNITY): Payer: Self-pay

## 2016-11-22 MED ORDER — ATORVASTATIN CALCIUM 40 MG PO TABS: 40.0000 mg | ORAL_TABLET | Freq: Every day | ORAL | 3 refills | Status: AC

## 2016-11-22 MED FILL — ATORVASTATIN 40 MG TABLET: 40 | 90 days supply | Qty: 90 | Fill #0

## 2016-11-22 NOTE — Telephone Encounter (Signed)
Pt called back I gave her results and medication changes. Sent in rx for atorvastatin

## 2016-11-22 NOTE — Addendum Note (Signed)
Addended by: Modesta MessingBROWN, Zephan Beauchaine Q on: 11/22/2016 10:59 AM   Modules accepted: Orders

## 2016-11-22 NOTE — Telephone Encounter (Signed)
Corliss BlackerB Nat Peptide  Order: [id number]  Status:  Final result Visible to patient:  Yes (MyChart) Dx:  Chronic systolic heart failure (HCC)  Notes Recorded by Chyrl CivatteMegan G Bobby Ragan, RN on 11/22/2016 at 10:54 AM EST Left VM to return call ------  Notes Recorded by Graciella FreerMichael Andrew Tillery, PA-C on 11/22/2016 at 8:56 AM EST Please also tell her to stay on her spironolactone. ------  Notes Recorded by Graciella FreerMichael Andrew Tillery, PA-C on 11/22/2016 at 7:22 AM EST Stable. Can resume her atorvastatin 40 mg daily.     Casimiro NeedleMichael [address]"Andy" Caddo Millsillery, PA-C 11/22/2016 7:22 AM

## 2016-11-26 MED FILL — JANUVIA 50 MG TABLET: 50 | 30 days supply | Qty: 30 | Fill #0

## 2016-11-28 ENCOUNTER — Encounter (HOSPITAL_COMMUNITY): Payer: Medicare Other

## 2016-11-28 ENCOUNTER — Telehealth (HOSPITAL_COMMUNITY): Payer: Self-pay

## 2016-11-28 NOTE — Telephone Encounter (Signed)
Attempted to reach patient twice to reschedule appointment with CHF clinic due to inclement weather. 1st attempt female sounding voice answered and hung up after this RN address purpose of call and asking to speak with Miss Ksiazek.  Unsure if this was on purpose (wrong number) or if connection was lost. 2nd attempt no answer, no VM available. Appointment rescheduled for next available opening next week.  Ave FilterBradley, Damonique Brunelle Genevea, RN

## 2016-12-06 ENCOUNTER — Ambulatory Visit (HOSPITAL_COMMUNITY)
Admission: RE | Admit: 2016-12-06 | Discharge: 2016-12-06 | Disposition: A | Discharge status: Home / Self Care | Payer: Medicare Other | Source: Ambulatory Visit | Attending: Cardiology | Admitting: Cardiology

## 2016-12-06 VITALS — BP 152/80 | HR 74 | Wt 227.4 lb

## 2016-12-06 DIAGNOSIS — Z79899 Other long term (current) drug therapy: Secondary | ICD-10-CM | POA: Diagnosis not present

## 2016-12-06 DIAGNOSIS — M109 Gout, unspecified: Secondary | ICD-10-CM | POA: Diagnosis not present

## 2016-12-06 DIAGNOSIS — I130 Hypertensive heart and chronic kidney disease with heart failure and stage 1 through stage 4 chronic kidney disease, or unspecified chronic kidney disease: Secondary | ICD-10-CM | POA: Insufficient documentation

## 2016-12-06 DIAGNOSIS — E1122 Type 2 diabetes mellitus with diabetic chronic kidney disease: Secondary | ICD-10-CM | POA: Insufficient documentation

## 2016-12-06 DIAGNOSIS — I2510 Atherosclerotic heart disease of native coronary artery without angina pectoris: Secondary | ICD-10-CM

## 2016-12-06 DIAGNOSIS — Z951 Presence of aortocoronary bypass graft: Secondary | ICD-10-CM | POA: Diagnosis not present

## 2016-12-06 DIAGNOSIS — I272 Pulmonary hypertension, unspecified: Secondary | ICD-10-CM

## 2016-12-06 DIAGNOSIS — E1165 Type 2 diabetes mellitus with hyperglycemia: Secondary | ICD-10-CM | POA: Insufficient documentation

## 2016-12-06 DIAGNOSIS — N183 Chronic kidney disease, stage 3 unspecified: Secondary | ICD-10-CM

## 2016-12-06 DIAGNOSIS — I252 Old myocardial infarction: Secondary | ICD-10-CM | POA: Diagnosis not present

## 2016-12-06 DIAGNOSIS — R29818 Other symptoms and signs involving the nervous system: Secondary | ICD-10-CM

## 2016-12-06 DIAGNOSIS — Z7982 Long term (current) use of aspirin: Secondary | ICD-10-CM | POA: Insufficient documentation

## 2016-12-06 DIAGNOSIS — E875 Hyperkalemia: Secondary | ICD-10-CM

## 2016-12-06 DIAGNOSIS — I5022 Chronic systolic (congestive) heart failure: Secondary | ICD-10-CM | POA: Diagnosis not present

## 2016-12-06 DIAGNOSIS — I255 Ischemic cardiomyopathy: Secondary | ICD-10-CM | POA: Diagnosis not present

## 2016-12-06 DIAGNOSIS — G4730 Sleep apnea, unspecified: Secondary | ICD-10-CM

## 2016-12-06 DIAGNOSIS — I5023 Acute on chronic systolic (congestive) heart failure: Secondary | ICD-10-CM | POA: Diagnosis not present

## 2016-12-06 LAB — BASIC METABOLIC PANEL
Anion gap: 11 (ref 5–15)
BUN: 49 mg/dL — AB (ref 6–20)
CHLORIDE: 99 mmol/L — AB (ref 101–111)
CO2: 25 mmol/L (ref 22–32)
CREATININE: 1.75 mg/dL — AB (ref 0.44–1.00)
Calcium: 9.5 mg/dL (ref 8.9–10.3)
GFR calc Af Amer: 34 mL/min — ABNORMAL LOW (ref >60)
GFR calc non Af Amer: 29 mL/min — ABNORMAL LOW (ref >60)
Glucose, Bld: 176 mg/dL — ABNORMAL HIGH (ref 65–99)
Potassium: 4.1 mmol/L (ref 3.5–5.1)
Sodium: 135 mmol/L (ref 135–145)

## 2016-12-06 LAB — BRAIN NATRIURETIC PEPTIDE: B Natriuretic Peptide: 854.6 pg/mL — ABNORMAL HIGH (ref 0.0–100.0)

## 2016-12-06 MED ORDER — FUROSEMIDE 10 MG/ML IJ SOLN
80.0000 mg | Freq: Once | INTRAMUSCULAR | Status: AC
  Administered 2016-12-06: 80 mg via INTRAVENOUS
  Filled 2016-12-06: qty 8

## 2016-12-06 MED FILL — GABAPENTIN 300 MG CAPSULE: 300 | 20 days supply | Qty: 60 | Fill #0

## 2016-12-06 MED FILL — SPIRONOLACTONE 25 MG TABLET: 25 | 30 days supply | Qty: 30 | Fill #1

## 2016-12-06 MED FILL — TORSEMIDE 20 MG TABLET: 20 | 30 days supply | Qty: 60 | Fill #1

## 2016-12-06 NOTE — Patient Instructions (Addendum)
Routine lab work today. Will notify you of abnormal results, otherwise no news is good news!  80 mg IV lasix administered today during your appointment.  HOLD torsemide the rest of today.  Follow up Wednesday February 7th at 11:30 am.  Do the following things EVERYDAY: 1) Weigh yourself in the morning before breakfast. Write it down and keep it in a log. 2) Take your medicines as prescribed 3) Eat low salt foods-Limit salt (sodium) to 2000 mg per day.  4) Stay as active as you can everyday 5) Limit all fluids for the day to less than 2 liters

## 2016-12-06 NOTE — Progress Notes (Signed)
PIV x 1 attempt started in RPFA 24 g for 80 mg IV lasix infusion x 1 dose now per Adny Tillery PA-C VO. Patient tolerated well, 80 mg IV lasix pushed over 4 minutes, flushed, and saline locked. Patient in CHF clinic room 2 with door open and family member and call bell at bedside. Will continue to monitor closely.  Total UOP:500 cc clear yellow urine  PIV removed and clean dry gauze dressing applied to site before patient discharged from CHF clinic OV.  Ave FilterBradley, Megan Genevea, RN

## 2016-12-06 NOTE — Progress Notes (Signed)
Patient ID: Christine Knight, female   DOB: Mar 06, 1951, 66 y.o.   MRN: [id number]     Advanced Heart Failure Clinic Note   PCP: Arnette Felts  HF: Dr. Gala Romney   HPI: Christine Knight is a 66 y.o. female w/ PMHx significant for morbid obesity, systolic HF due to ICM (10/12 EF 25%; 07/2012 EF 35%; 03/18/13 EF 20-25%), CAD with h/o NSTEMI s/p CABG Oct 2012, pulmonary hypertension (PA peak ), and poorly controlled DM 2.  She was admitted to [facility] in 2014 for dyspnea, leg edema, and weight gain.  Lasix gtt used and she diuresed 15 liters. Diuresed 29 pounds.  ECHO- EF 20-25%.  Discharge weight 264 pounds.    Admitted 12/08/2015 - 12/28/2015 with worsening DOE and cough. Stated she had a 40 lb weight since 07/2015. She was diuresed aggressively with lasix gtt at 30 mg/hr and metolazone 5 mg BID. Diamox eventually added. Diuretics held temporarily towards end of stay with worsening Creatinine. Overall she diuresed >40 L and was down 70 lbs from admission weight. Pt had R/LHC with preserved CO and mild/mod Pulm HTN. Severe native CAD with patent CABG grafts. Discharge weight 237 lbs.   Admitted 6/1 -04/26/2016 with marked volume overload, low output hf, cardiorenal syndrome. Diuresed with high dose lasix 160 mg three times a day. Diuresed over 50 pounds. Transitioned to torsemide 80 mg daily. BB and losartan stopped. Discharge weight 222 pounds   Admitted 10/15 - 09/08/16 with marked volume overload. PT diuresed with high dose IV lasix, metolazone, as well as Milrinone to augment diuresis. Diamox also added.  Pt diuresed 35 L and lost 44 lbs. Pt transitioned back to po torsemide, and had to be adjusted further with AKI.  Discharge weight 224 lbs.  Admitted  12/6 through 11/02/16 with marked volume overload. Diuresed with IV lasix, metolazone, and milrinone. Overall diuresed 41 pounds. Discharged on torsemide 60 mg twice a day. Discharge weight was 211 pounds.   She presents today for follow up.  Weight up 13 lbs  from last visit.  States has been eating well and peeing lot. Weight has been up gradually for the past 2 weeks, nothing sudden. Breathing is good. Walking around [employer] without shortness of breath.  Denies orthopnea or PND. Paramedicine following as well as [facility] for tele monitoring.  Sugars have been better. Drinking > 2 L still. Denies dietary non-compliance.   ROS: All systems negative except as above, in PMH, and in Problem list.   Labs 2/17 K 4.3, Cr 1.56 Labs 04/26/2016: K 3.1 Creatinine 1.78 K 3.1 Labs 06/05/2016: K 4.3 Creatinine 1.25 Labas 06/13/2016: K 4.0 Creatinine 1.49  Labs 07/31/2016: K 4.5 Creatinine 2.27  Labs  08/05/2016: K 5.0 Creatinine 3.18  11/02/2017: K 3.6 Creatinine 1.59   03/18/13 ECHO EF 20-25% 06/17/2013 ECHO EF 35% RV ok (read formally after visit 35-40%) 03/15/2015 ECHO EF 30-35%, grade 3 DD, Moderate TR, Severely reduced RV, PA peak pressure 81 mm Hg 12/09/2015 ECHO EF 15-20%, Decreased LV diastolic compliance, RV mod reduced, PA peak 57 mmHg, Severe TR  04/14/16 Echo 15-20%  10/2016: EF Peak PA pressure 89 mm hg  EF 35-40% RV dysfunction.    [facility] 12/13/15 Hemodynamics RA 25/25 (23) RV pressure 92/13  RV EDP 26 PA pressure 86/34 (56) PW mean 37 AO Pressure 114/72 (88) LV Pressure 101/12 (26) LV EDP 26 PVR 3.24 Fick CO/CI 5.85 / 2.41L/min   Prox LAD lesion, 100% stenosed. Prox Cx lesion, 99% stenosed. Mid RCA lesion, 80% stenosed.  Dist RCA lesion, 100% stenosed. Patent grafts    Past Medical History:  Diagnosis Date  . CAD (coronary artery disease) 08/2011   NSTEMI with subsequent CABG 08/13/2011 with LIMA-LAD, SVG-diagonal, SVG-OM, SVG-PDA  . Carpal tunnel syndrome on both sides    "post OHS in 08/2011; resolved now" (03/17/2013)  . CHF (congestive heart failure) (HCC)   . Chronic systolic heart failure (HCC)    Chronic systolic CHF  . Diabetes mellitus type 2 in obese (HCC) 08/10/11  . Dyspnea   . Ejection fraction < 50%    EF 20%, October, 2012  //   EF 25% December, 2012  //   EF 35%, echo, September, 2013  . Hyperlipidemia 08/10/11  . Hypertension   . Ischemic cardiomyopathy 08/13/2011   EF 20% in 08/2011, still 25% 10/21/2011  . Mitral regurgitation    Mild by echo 10/21/11  . Myocardial infarction 08/2011  . Obesity, morbid (HCC)   . Orthopnoea    "progressively worse over last 3 wks" (03/17/2013)  . Pleural effusion    Requiring L thoracentesis 09/02/11  . Pulmonary hypertension    Echo, September, 2013, 72 mmHg.    Current Outpatient Prescriptions  Medication Sig Dispense Refill  . acetaminophen (TYLENOL) 325 MG tablet Take 2 tablets (650 mg total) by mouth every 4 (four) hours as needed for headache or mild pain.    Marland Kitchen aspirin 81 MG tablet Take 1 tablet (81 mg total) by mouth daily. 30 tablet 9  . atorvastatin (LIPITOR) 40 MG tablet Take 1 tablet (40 mg total) by mouth daily. 90 tablet 3  . gabapentin (NEURONTIN) 300 MG capsule Take 300 mg by mouth 3 (three) times daily.     Marland Kitchen glipiZIDE (GLUCOTROL) 10 MG tablet Take 10 mg by mouth 2 (two) times daily.    . Multiple Vitamins-Minerals (CENTRUM SILVER 50+WOMEN PO) Take 1 tablet by mouth every morning.    Marland Kitchen spironolactone (ALDACTONE) 25 MG tablet Take 1 tablet (25 mg total) by mouth daily. 30 tablet 5  . torsemide (DEMADEX) 20 MG tablet Take 3 tablets (60 mg total) by mouth 2 (two) times daily. 180 tablet 5   No current facility-administered medications for this encounter.      PHYSICAL EXAM: Vitals:   12/06/16 0908  BP: (!) 152/80  BP Location: Left Arm  Patient Position: Sitting  Cuff Size: Normal  Pulse: 74  SpO2: 95%  Weight: 227 lb 6.4 oz (103.1 kg)   Wt Readings from Last 3 Encounters:  12/06/16 227 lb 6.4 oz (103.1 kg)  11/21/16 214 lb 6.4 oz (97.3 kg)  11/14/16 211 lb 6.4 oz (95.9 kg)     General: Obese. Walked into clinic without difficulty.  HEENT: Normal Neck: JVP to jaw. Carotids 2+ bilat; no bruits. No thyromegaly or nodule noted.  Cor: PMI  nondisplaced. Distant, Irregular  No M/G/R Lungs: Decreased basilar sounds.  Abdomen: Morbidly obese, soft, NT, ND, no HSM. No bruits or masses. +BS  Extremities: no cyanosis, clubbing, rash. 1+ BLE edema. Compression stockings.    Neuro: alert & orientedx3, cranial nerves grossly intact. moves all 4 extremities w/o difficulty. Affect pleasant  EKG: Junctional rhythm 67 bpm with PVCs  ASSESSMENT/PLAN 1. Acute on Chronic systolic HF due to iCM EF 15-20% Echo 04/14/2016. - ECHO 10/2016 EF 35-40% RV dysfunction.   - NYHA class II-III. Volume status elevated on exam and up 13 lbs. Will give IV 80-mg lasix in clinic today.  - Continue torsemide 60 mg BID chronically.  Needs to limit fluid intake.  - No bb with low output.  - Continue spiro 25 mg daily. - No ACE due to allergy - No Entresto due to h/o of AKI and Hyper K on same.  - No dig with CKD.  - Intolerant bidil with difficulty with headaches, dizziness and hypotension.  - Reinforced fluid restriction to < 2 L daily, sodium restriction to less than 2000 mg daily, and the importance of daily weights.   2. CAD s/p NSTEMI and CABG 10/12  - No CP. Continue atorvastatin 40 mg daily.  - [facility]. Continue ASA 81 mg.  3. Morbid obesity - Again encouraged to limit portions and increase activity.   4. Pulmonary HTN - mild to mod RHC 12/13/15. PVR 3.24.  - No change.  5. CKD stage III - Stat BMET today.  6. DM2  - Per PCP. 7. Suspected sleep apnea. Day time fatigue-  -Sleep study has been arranged for 12/25/16. Encouraged to keep appt.  8. Gout - Per PCP  9. Hyperkalemia - Had resolved as of last labs.   Volume status elevated on exam.  Will attempt early intervention with IV lasix to try and keep out of hospital. IV lasix 80 mg given in clinic today with 500 of output. Urine clear/yellow.   Will use lasix as replacement of evening torsemide.   Graciella FreerMichael Andrew Azalyn Sliwa, PA-C 9:46 AM   Total time  spent > 40 minutes. Over half that spent discussing the above.

## 2016-12-18 ENCOUNTER — Telehealth: Payer: Self-pay | Admitting: Internal Medicine

## 2016-12-18 ENCOUNTER — Emergency Department (HOSPITAL_COMMUNITY)
Admission: EM | Admit: 2016-12-18 | Discharge: 2016-12-19 | Disposition: A | Discharge status: Home / Self Care | Payer: Medicare Other | Attending: Emergency Medicine | Admitting: Emergency Medicine

## 2016-12-18 ENCOUNTER — Encounter (HOSPITAL_COMMUNITY): Payer: Self-pay | Admitting: Family Medicine

## 2016-12-18 ENCOUNTER — Encounter (HOSPITAL_COMMUNITY): Payer: Medicare Other

## 2016-12-18 DIAGNOSIS — R251 Tremor, unspecified: Secondary | ICD-10-CM | POA: Diagnosis present

## 2016-12-18 DIAGNOSIS — I5023 Acute on chronic systolic (congestive) heart failure: Secondary | ICD-10-CM | POA: Diagnosis not present

## 2016-12-18 DIAGNOSIS — E039 Hypothyroidism, unspecified: Secondary | ICD-10-CM | POA: Insufficient documentation

## 2016-12-18 DIAGNOSIS — I130 Hypertensive heart and chronic kidney disease with heart failure and stage 1 through stage 4 chronic kidney disease, or unspecified chronic kidney disease: Secondary | ICD-10-CM | POA: Insufficient documentation

## 2016-12-18 DIAGNOSIS — I252 Old myocardial infarction: Secondary | ICD-10-CM | POA: Diagnosis not present

## 2016-12-18 DIAGNOSIS — Z87891 Personal history of nicotine dependence: Secondary | ICD-10-CM | POA: Insufficient documentation

## 2016-12-18 DIAGNOSIS — E1140 Type 2 diabetes mellitus with diabetic neuropathy, unspecified: Secondary | ICD-10-CM | POA: Diagnosis not present

## 2016-12-18 DIAGNOSIS — E1122 Type 2 diabetes mellitus with diabetic chronic kidney disease: Secondary | ICD-10-CM | POA: Insufficient documentation

## 2016-12-18 DIAGNOSIS — I2510 Atherosclerotic heart disease of native coronary artery without angina pectoris: Secondary | ICD-10-CM | POA: Insufficient documentation

## 2016-12-18 DIAGNOSIS — Z7984 Long term (current) use of oral hypoglycemic drugs: Secondary | ICD-10-CM | POA: Insufficient documentation

## 2016-12-18 DIAGNOSIS — Z951 Presence of aortocoronary bypass graft: Secondary | ICD-10-CM | POA: Insufficient documentation

## 2016-12-18 DIAGNOSIS — N183 Chronic kidney disease, stage 3 (moderate): Secondary | ICD-10-CM | POA: Diagnosis not present

## 2016-12-18 DIAGNOSIS — Z7982 Long term (current) use of aspirin: Secondary | ICD-10-CM | POA: Diagnosis not present

## 2016-12-18 DIAGNOSIS — Z79899 Other long term (current) drug therapy: Secondary | ICD-10-CM | POA: Diagnosis not present

## 2016-12-18 NOTE — ED Triage Notes (Signed)
Patient is from home and reports the home health nurse gave her 3 pills of potassium that she was not suppose to be taking. Pt reports her Potassium is 6 and was informed by her provider that she is not suppose to have more potassium. Pt reports she took the medication because nurse told her to take it since she was getting Lasix.

## 2016-12-18 NOTE — Telephone Encounter (Signed)
Received page regarding patient's K = 6.6. Called back and spoke with Lorretta and instructed her to call pt to go to ED and get her labs checked again. If her K is elevated, she will need treatment for it.

## 2016-12-19 LAB — BASIC METABOLIC PANEL
Anion gap: 9 (ref 5–15)
BUN: 54 mg/dL — ABNORMAL HIGH (ref 6–20)
CHLORIDE: 99 mmol/L — AB (ref 101–111)
CO2: 28 mmol/L (ref 22–32)
CREATININE: 1.69 mg/dL — AB (ref 0.44–1.00)
Calcium: 9.6 mg/dL (ref 8.9–10.3)
GFR calc non Af Amer: 31 mL/min — ABNORMAL LOW (ref >60)
GFR, EST AFRICAN AMERICAN: 36 mL/min — AB (ref >60)
Glucose, Bld: 112 mg/dL — ABNORMAL HIGH (ref 65–99)
POTASSIUM: 4.2 mmol/L (ref 3.5–5.1)
SODIUM: 136 mmol/L (ref 135–145)

## 2016-12-19 LAB — CBC
HCT: 41.3 % (ref 36.0–46.0)
Hemoglobin: 13.7 g/dL (ref 12.0–15.0)
MCH: 29.5 pg (ref 26.0–34.0)
MCHC: 33.2 g/dL (ref 30.0–36.0)
MCV: 89 fL (ref 78.0–100.0)
PLATELETS: 268 10*3/uL (ref 150–400)
RBC: 4.64 MIL/uL (ref 3.87–5.11)
RDW: 16.8 % — ABNORMAL HIGH (ref 11.5–15.5)
WBC: 6 10*3/uL (ref 4.0–10.5)

## 2016-12-19 NOTE — ED Provider Notes (Signed)
[facility]-EMERGENCY DEPT Provider Note   CSN: [id number] Arrival date & time: 12/18/16  2300     History   Chief Complaint Chief Complaint  Patient presents with  . Drug Overdose    HPI Christine Knight is a 66 y.o. female.  HPI Patient presents to the emergency room for evaluation of possible hyperkalemia. Patient states she has a history of heart disease and multiple medical problems.Pt states she  Is no longer supposed to be taking potassium but the home health nurse gave her 3 potassium pills this morning.  After that, she began feeling jittery and dizzy. She also felt anxious. Her hands have been shaking. She denies any trouble with any chest pain or shortness of breath. No focal numbness or weakness.   Past Medical History:  Diagnosis Date  . CAD (coronary artery disease) 08/2011   NSTEMI with subsequent CABG 08/13/2011 with LIMA-LAD, SVG-diagonal, SVG-OM, SVG-PDA  . Carpal tunnel syndrome on both sides    "post OHS in 08/2011; resolved now" (03/17/2013)  . CHF (congestive heart failure) (HCC)   . Chronic systolic heart failure (HCC)    Chronic systolic CHF  . Diabetes mellitus type 2 in obese (HCC) 08/10/11  . Dyspnea   . Ejection fraction < 50%    EF 20%, October, 2012  //  EF 25% December, 2012  //   EF 35%, echo, September, 2013  . Hyperlipidemia 08/10/11  . Hypertension   . Ischemic cardiomyopathy 08/13/2011   EF 20% in 08/2011, still 25% 10/21/2011  . Mitral regurgitation    Mild by echo 10/21/11  . Myocardial infarction 08/2011  . Obesity, morbid (HCC)   . Orthopnoea    "progressively worse over last 3 wks" (03/17/2013)  . Pleural effusion    Requiring L thoracentesis 09/02/11  . Pulmonary hypertension    Echo, September, 2013, 72 mmHg.    Patient Active Problem List   Diagnosis Date Noted  . Acute on chronic systolic ACC/AHA stage C congestive heart failure (HCC) 12/06/2016  . CAD (coronary artery disease) 12/06/2016  . Suspected sleep apnea 11/21/2016  . CHF  (congestive heart failure) (HCC) 08/26/2016  . CKD (chronic kidney disease) stage 3, GFR 30-59 ml/min 05/02/2016  . Chest tightness   . Mass of lower lobe of left lung 07/25/2015  . Primary gout 07/25/2015  . NSTEMI- declined cath 05/29/2015  . DJD (degenerative joint disease), multiple sites 11/07/2014  . Painful diabetic neuropathy (HCC) 10/10/2014  . Hyperkalemia 06/17/2013  . Chronic systolic heart failure (HCC) 04/02/2013  . Hypothyroidism 03/18/2013  . Pulmonary hypertension (HCC)   . Hx of CABG 2012 08/30/2011  . Obesity, morbid-BMI 45   . Type 2 diabetes, uncontrolled, with neuropathy (HCC)   . Ischemic cardiomyopathy-30-35% May 2016 08/13/2011  . Hyperlipidemia LDL goal <70 08/10/2011    Past Surgical History:  Procedure Laterality Date  . CARDIAC CATHETERIZATION N/A 12/13/2015   Procedure: Right/Left Heart Cath and Coronary/Graft Angiography;  Surgeon: Peter M SwazilandJordan, MD;  [facility];  Service: Cardiovascular;  Laterality: N/A;  . CHOLECYSTECTOMY    . CORONARY ARTERY BYPASS GRAFT  08/13/11   CABG x4 with LIMA to LAD, SVG to Diag, SVG to OM, SVG to PDA, EVH via both thighs  . LAPAROSCOPY  1980's?   "removed gallstones; not gallbladder" (03/17/2013)    OB History    No data available       Home Medications    Prior to Admission medications   Medication Sig Start Date  End Date Taking? Authorizing Provider  acetaminophen (TYLENOL) 325 MG tablet Take 2 tablets (650 mg total) by mouth every 4 (four) hours as needed for headache or mild pain. 11/02/16   Leone Brand, NP  aspirin 81 MG tablet Take 1 tablet (81 mg total) by mouth daily. 11/15/13   Luis Abed, MD  atorvastatin (LIPITOR) 40 MG tablet Take 1 tablet (40 mg total) by mouth daily. 11/22/16 02/20/17  [name], PA-C  gabapentin (NEURONTIN) 300 MG capsule Take 300 mg by mouth 3 (three) times daily.     Historical Provider, MD  glipiZIDE (GLUCOTROL) 10 MG tablet Take 10 mg by mouth 2  (two) times daily.    Historical Provider, MD  Multiple Vitamins-Minerals (CENTRUM SILVER 50+WOMEN PO) Take 1 tablet by mouth every morning.    Historical Provider, MD  spironolactone (ALDACTONE) 25 MG tablet Take 1 tablet (25 mg total) by mouth daily. 11/06/16   Dolores Patty, MD  torsemide (DEMADEX) 20 MG tablet Take 3 tablets (60 mg total) by mouth 2 (two) times daily. 11/06/16   Dolores Patty, MD    Family History Family History  Problem Relation Age of Onset  . Heart disease Father   . Hypertension Maternal Grandmother   . Hypertension Sister   . Cancer Neg Hx   . Diabetes Neg Hx   . Kidney disease Neg Hx     Social History Social History  Substance Use Topics  . Smoking status: Former Smoker    Types: Cigarettes    Quit date: 11/11/1978  . Smokeless tobacco: Never Used     Comment: 03/17/2013 "only a social smoker when I did smoke; ever bought any"  . Alcohol use Yes     Comment: OCCASIONAL     Allergies   Lisinopril and Metformin and related   Review of Systems Review of Systems  All other systems reviewed and are negative.    Physical Exam Updated Vital Signs BP 120/84   Pulse 62   Temp 97.9 F (36.6 C) (Oral)   Resp 21   Ht 5\' 6"  (1.676 m)   Wt 107 kg   SpO2 96%   BMI 38.09 kg/m   Physical Exam  Constitutional: No distress.  HENT:  Head: Normocephalic and atraumatic.  Right Ear: External ear normal.  Left Ear: External ear normal.  Eyes: Conjunctivae are normal. Right eye exhibits no discharge. Left eye exhibits no discharge. No scleral icterus.  Neck: Neck supple. No tracheal deviation present.  Cardiovascular: Normal rate, regular rhythm and intact distal pulses.   Pulmonary/Chest: Effort normal and breath sounds normal. No stridor. No respiratory distress. She has no wheezes. She has no rales.  Abdominal: Soft. Bowel sounds are normal. She exhibits no distension. There is no tenderness. There is no rebound and no guarding.    Musculoskeletal: She exhibits no edema or tenderness.  Neurological: She is alert. She has normal strength. She displays no tremor. No cranial nerve deficit (no facial droop, extraocular movements intact, no slurred speech) or sensory deficit. She exhibits normal muscle tone. She displays no seizure activity. Coordination normal.  Skin: Skin is warm and dry. No rash noted.  Psychiatric: She has a normal mood and affect.  Nursing note and vitals reviewed.    ED Treatments / Results  Labs (all labs ordered are listed, but only abnormal results are displayed) Labs Reviewed  BASIC METABOLIC PANEL - Abnormal; Notable for the following:       Result Value  Chloride 99 (*)    Glucose, Bld 112 (*)    BUN 54 (*)    Creatinine, Ser 1.69 (*)    GFR calc non Af Amer 31 (*)    GFR calc Af Amer 36 (*)    All other components within normal limits  CBC - Abnormal; Notable for the following:    RDW 16.8 (*)    All other components within normal limits    EKG  EKG Interpretation  Date/Time:  Thursday December 19 2016 00:32:29 EST Ventricular Rate:  64 PR Interval:    QRS Duration: 105 QT Interval:  422 QTC Calculation: 436 R Axis:   136 Text Interpretation:  undetermined rhythm Right axis deviation Low voltage, precordial leads Anteroseptal infarct, old Minimal ST depression, inferior leads No significant change since last tracing Confirmed by Sara Selvidge  MD-J, Almena Hokenson ([id number]) on 12/19/2016 12:44:36 AM       Radiology No results found.  Procedures Procedures (including critical care time)  Medications Ordered in ED Medications - No data to display   Initial Impression / Assessment and Plan / ED Course  I have reviewed the triage vital signs and the nursing notes.  Pertinent labs & imaging results that were available during my care of the patient were reviewed by me and considered in my medical decision making (see chart for details).   I reviewed the medical records. There was a note in  the chart from her doctor's office indicating that her potassium level was elevated at 6.6. This must have been from a outpatient lab.  Patient's laboratory tests here shows a normal potassium. She does not require any acute treatment.  Stable for discharge.    Final Clinical Impressions(s) / ED Diagnoses   Final diagnoses:  Tremor    New Prescriptions New Prescriptions   No medications on file     Linwood Dibbles, MD 12/19/16 [phone]

## 2016-12-19 NOTE — ED Notes (Signed)
Patient was alert, oriented and stable upon discharge. RN went over AVS and patient had no further questions.  

## 2016-12-19 NOTE — Discharge Instructions (Signed)
Potassium level was normal tonight, follow-up with your doctor as planned, return as needed for worsening symptoms

## 2016-12-20 ENCOUNTER — Other Ambulatory Visit (HOSPITAL_COMMUNITY): Payer: Self-pay | Admitting: Cardiology

## 2016-12-20 MED ORDER — TORSEMIDE 20 MG PO TABS: 60.0000 mg | ORAL_TABLET | Freq: Two times a day (BID) | ORAL | 5 refills | Status: DC

## 2016-12-20 MED FILL — TORSEMIDE 20 MG TABLET: 20 | 30 days supply | Qty: 180 | Fill #0

## 2016-12-25 ENCOUNTER — Ambulatory Visit (HOSPITAL_BASED_OUTPATIENT_CLINIC_OR_DEPARTMENT_OTHER): Payer: Medicare Other | Attending: Adult Health | Admitting: Cardiology

## 2016-12-25 VITALS — Ht 66.0 in | Wt 262.0 lb

## 2016-12-25 DIAGNOSIS — G4733 Obstructive sleep apnea (adult) (pediatric): Secondary | ICD-10-CM | POA: Diagnosis not present

## 2016-12-25 DIAGNOSIS — Z6836 Body mass index (BMI) 36.0-36.9, adult: Secondary | ICD-10-CM | POA: Insufficient documentation

## 2016-12-25 DIAGNOSIS — I471 Supraventricular tachycardia: Secondary | ICD-10-CM | POA: Insufficient documentation

## 2016-12-25 DIAGNOSIS — R0683 Snoring: Secondary | ICD-10-CM | POA: Diagnosis not present

## 2016-12-25 DIAGNOSIS — I5022 Chronic systolic (congestive) heart failure: Secondary | ICD-10-CM | POA: Diagnosis present

## 2016-12-25 DIAGNOSIS — E669 Obesity, unspecified: Secondary | ICD-10-CM | POA: Diagnosis not present

## 2016-12-25 DIAGNOSIS — E119 Type 2 diabetes mellitus without complications: Secondary | ICD-10-CM | POA: Insufficient documentation

## 2016-12-25 DIAGNOSIS — I493 Ventricular premature depolarization: Secondary | ICD-10-CM | POA: Insufficient documentation

## 2016-12-26 MED FILL — GABAPENTIN 300 MG CAPSULE: 300 | 30 days supply | Qty: 90 | Fill #0

## 2016-12-26 NOTE — Procedures (Signed)
Patient Name: Christine Knight, Christine Knight Date: 12/25/2016 Gender: Female D.O.B: 10-25-1951 Age (years): 54 Referring Provider: Darrick Grinder NP Height (inches): 66 Interpreting Physician: Fransico Him MD, ABSM Weight (lbs): 226 RPSGT: Carolin Coy BMI: 59 MRN: [id number] Neck Size: 15.00  CLINICAL INFORMATION Sleep Study Type: Split Night CPAP  Indication for sleep study: Congestive Heart Failure, Diabetes, Obesity, OSA  Epworth Sleepiness Score: 15  SLEEP STUDY TECHNIQUE As per the AASM Manual for the Scoring of Sleep and Associated Events v2.3 (April 2016) with a hypopnea requiring 4% desaturations.  The channels recorded and monitored were frontal, central and occipital EEG, electrooculogram (EOG), submentalis EMG (chin), nasal and oral airflow, thoracic and abdominal wall motion, anterior tibialis EMG, snore microphone, electrocardiogram, and pulse oximetry. Continuous positive airway pressure (CPAP) was initiated when the patient met split night criteria and was titrated according to treat sleep-disordered breathing.  MEDICATIONS Medications self-administered by patient taken the night of the study : N/A  RESPIRATORY PARAMETERS Diagnostic Total AHI (/hr): 30.0  RDI (/hr):30.3  OA Index (/hr): 25 CA Index (/hr): 0.4 REM AHI (/hr): 42.0  NREM AHI (/hr):28.4  Supine AHI (/hr):35.9  Non-supine AHI (/hr):24.50 Min O2 Sat (%):83.00  Mean O2 (%): 91.61  Time below 88% (min):10.0    Titration Optimal Pressure (cm):14  AHI at Optimal Pressure (/hr):0.0  Min O2 at Optimal Pressure (%):91.0 Supine % at Optimal (%):100  Sleep % at Optimal (%):91    SLEEP ARCHITECTURE The recording time for the entire night was 343.5 minutes.  During a baseline period of 174.1 minutes, the patient slept for 170.1 minutes in REM and nonREM, yielding a sleep efficiency of 97.7%. Sleep onset after lights out was 0.5 minutes with a REM latency of 102.5 minutes. The patient spent 5.88% of  the night in stage N1 sleep, 82.37% in stage N2 sleep, 0.00% in stage N3 and 11.76% in REM.  During the titration period of 162.2 minutes, the patient slept for 160.0 minutes in REM and nonREM, yielding a sleep efficiency of 98.7%. Sleep onset after CPAP initiation was 0.6 minutes with a REM latency of N/A minutes. The patient spent 1.25% of the night in stage N1 sleep, 98.75% in stage N2 sleep, 0.00% in stage N3 and 0.00% in REM.  CARDIAC DATA The 2 lead EKG demonstrated sinus rhythm. The mean heart rate was 61.45 beats per minute. Other EKG findings include: PVCs, multifocal PVCs, bigeminal PVCs, trigeminal PVCs, ventricular couplets, and triplets, nonsustained atrial tachycardia.  LEG MOVEMENT DATA The total Periodic Limb Movements of Sleep (PLMS) were 36. The PLMS index was 6.52 .  IMPRESSIONS - Severe obstructive sleep apnea occurred during the diagnostic portion of the study (AHI = 30.0/hour). An optimal PAP pressure was selected for this patient ( 14 cm of water) - No significant central sleep apnea occurred during the diagnostic portion of the study (CAI = 0.4/hour). - The patient had minimal or no oxygen desaturation during the diagnostic portion of the study (Min O2 = 83.00%) - The patient snored with Moderate snoring volume during the diagnostic portion of the study. - EKG findings include PVCs., multifocal PVCs, bigeminal PVCs, ventricular couplets and triplets and nonsustained atrial tachycardia. - Clinically significant periodic limb movements did not occur during sleep.  DIAGNOSIS - Obstructive Sleep Apnea (327.23 [G47.33 ICD-10])  RECOMMENDATIONS - Trial of CPAP therapy on 14 cm H2O with a Small size Fisher&Paykel Full Face Mask Simplus mask and heated humidification. - Avoid alcohol, sedatives and other CNS depressants that may worsen  sleep apnea and disrupt normal sleep architecture. - Sleep hygiene should be reviewed to assess factors that may improve sleep quality. -  Weight management and regular exercise should be initiated or continued. - Return to Sleep Center for re-evaluation after 10 weeks of therapy  [place] [place] [facility]  ELECTRONICALLY SIGNED ON:  12/26/2016, 7:06 PM [place] PH: [phone]   FX: [phone] [place]

## 2017-01-01 ENCOUNTER — Telehealth (HOSPITAL_COMMUNITY): Payer: Self-pay | Admitting: *Deleted

## 2017-01-01 ENCOUNTER — Other Ambulatory Visit (HOSPITAL_COMMUNITY): Payer: Self-pay

## 2017-01-01 MED ORDER — METOLAZONE 5 MG PO TABS: 5.0000 mg | ORAL_TABLET | Freq: Once | ORAL | 0 refills | Status: DC

## 2017-01-01 MED FILL — metOLazone 5 MG TABS: 5 | 1 days supply | Qty: 1 | Fill #0

## 2017-01-01 NOTE — Telephone Encounter (Signed)
Christine Knight with paramed called reporting patient to have a 16 lb weight gain since Feb. 1st, wheezing in lung bases, and generalized edema.  Patient stated she had been fighting the flu recently and was unable to take her meds as prescribed.  Patient did state she took her torsemide 60 mg this morning and she is due the same amount tonight.  Patient already has an appointment with us tomorrow.  Christine Knight stated that she had been unable to reach patient and did a drop by visit today to check on patient.  Spoke with Christine SaberAndy Tillery, PA and he advises patient to take 5 mg metolazone today and re-educate patient on how to take torsemide and will evaluate patient tomorrow at her clinic visit.  Christine Knight is agreeable with plan and prescription for metolazone has been called into pharmacy.  Christine Knight will also advise patient that if she feel worse through the night she needs to go to the emergency room.

## 2017-01-01 NOTE — Progress Notes (Signed)
Paramedicine Encounter    Patient ID: Christine Knight, female    DOB: Apr 04, 1951, 66 y.o.   MRN: [id number]   Patient Care Team: Coralee Rud, PA-C as PCP - General (Cardiology) Luis Abed, MD (Cardiology)  Patient Active Problem List   Diagnosis Date Noted  . Acute on chronic systolic ACC/AHA stage C congestive heart failure (HCC) 12/06/2016  . CAD (coronary artery disease) 12/06/2016  . Suspected sleep apnea 11/21/2016  . CHF (congestive heart failure) (HCC) 08/26/2016  . CKD (chronic kidney disease) stage 3, GFR 30-59 ml/min 05/02/2016  . Chest tightness   . Mass of lower lobe of left lung 07/25/2015  . Primary gout 07/25/2015  . NSTEMI- declined cath 05/29/2015  . DJD (degenerative joint disease), multiple sites 11/07/2014  . Painful diabetic neuropathy (HCC) 10/10/2014  . Hyperkalemia 06/17/2013  . Chronic systolic heart failure (HCC) 04/02/2013  . Hypothyroidism 03/18/2013  . Pulmonary hypertension (HCC)   . Hx of CABG 2012 08/30/2011  . Obesity, morbid-BMI 45   . Type 2 diabetes, uncontrolled, with neuropathy (HCC)   . Ischemic cardiomyopathy-30-35% May 2016 08/13/2011  . Hyperlipidemia LDL goal <70 08/10/2011    Current Outpatient Prescriptions:  .  acetaminophen (TYLENOL) 325 MG tablet, Take 2 tablets (650 mg total) by mouth every 4 (four) hours as needed for headache or mild pain., Disp: , Rfl:  .  aspirin 81 MG tablet, Take 1 tablet (81 mg total) by mouth daily., Disp: 30 tablet, Rfl: 9 .  atorvastatin (LIPITOR) 40 MG tablet, Take 1 tablet (40 mg total) by mouth daily., Disp: 90 tablet, Rfl: 3 .  gabapentin (NEURONTIN) 300 MG capsule, Take 300 mg by mouth 3 (three) times daily. , Disp: , Rfl:  .  glipiZIDE (GLUCOTROL) 10 MG tablet, Take 10 mg by mouth 2 (two) times daily., Disp: , Rfl:  .  Multiple Vitamins-Minerals (CENTRUM SILVER 50+WOMEN PO), Take 1 tablet by mouth every morning., Disp: , Rfl:  .  spironolactone (ALDACTONE) 25 MG tablet, Take 1 tablet (25  mg total) by mouth daily., Disp: 30 tablet, Rfl: 5 .  torsemide (DEMADEX) 20 MG tablet, Take 3 tablets (60 mg total) by mouth 2 (two) times daily., Disp: 180 tablet, Rfl: 5 Allergies  Allergen Reactions  . Lisinopril Nausea And Vomiting, Other (See Comments) and Cough    Coughing, throwing up, waking up coughing  . Metformin And Related Anaphylaxis     Social History   Social History  . Marital status: Divorced    Spouse name: N/A  . Number of children: 3  . 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 Smoking status: Former Smoker    Types: Cigarettes    Quit date: 11/11/1978  . Smokeless tobacco: Never Used     Comment: 03/17/2013 "only a social smoker when I did smoke; ever bought any"  . Alcohol use Yes     Comment: OCCASIONAL  . Drug use: No  . Sexual activity: Not Currently    Birth control/ protection: Post-menopausal   Other Topics Concern  . Not on file   Social History Narrative   Lives with her sister and daughter.  Ambulates independently.    Physical Exam      Future Appointments Date Time Provider Department Center  01/02/2017 2:00 PM [facility] PA/NP [facility] None   ATF pt CAO x4 "pop up visit" or un-scheduled visit due to pt not answering phone or returning  calls. Pt has a appointment at the heart clinic tomorrow, which [facility] wanted to make sure she was aware of.  Upon my arrival pt has edema in her face, hands, legs, and feet.  Pt stated that she has had the flu and was not taking her medications. Pt stated that she has been taking her medications as prescribed but she has been eating "the wrong foods". Pt stated that she has eaten potatoe soup. Due to the increase in weight, heart clinic contacted to see if pt can come in today instead of waiting until tomorrow.  Susie from the clinic stated she will call back after talking with Mardelle MatteAndy.  Pt also reports shakes and trimmers since our last visit  **susie called  back and stated that pt is to take a metolazone (rx called in) and to take her regular prescribed torsemide tonight.   CBG 187 BP 112/70 (BP Location: Left Arm, Patient Position: Sitting, Cuff Size: Normal)   Pulse (!) 53   Resp 12   Wt 240 lb (108.9 kg)   SpO2 98%   BMI 38.74 kg/m   Weight yesterday-pt cant remember Last visit weight-224    ACTION: Home visit completed

## 2017-01-02 ENCOUNTER — Ambulatory Visit (HOSPITAL_BASED_OUTPATIENT_CLINIC_OR_DEPARTMENT_OTHER)
Admission: RE | Admit: 2017-01-02 | Discharge: 2017-01-02 | Disposition: A | Discharge status: Home / Self Care | Payer: Medicare Other | Source: Ambulatory Visit | Attending: Internal Medicine | Admitting: Internal Medicine

## 2017-01-02 ENCOUNTER — Telehealth: Payer: Self-pay | Admitting: *Deleted

## 2017-01-02 ENCOUNTER — Inpatient Hospital Stay (HOSPITAL_COMMUNITY): Payer: Medicare Other

## 2017-01-02 ENCOUNTER — Inpatient Hospital Stay (HOSPITAL_COMMUNITY)
Admission: AD | Admit: 2017-01-02 | Discharge: 2017-01-17 | DRG: 291 | Disposition: A | Discharge status: Home / Self Care | Payer: Medicare Other | Source: Ambulatory Visit | Attending: Internal Medicine | Admitting: Internal Medicine

## 2017-01-02 ENCOUNTER — Encounter: Payer: Self-pay | Admitting: *Deleted

## 2017-01-02 VITALS — BP 120/70 | HR 95 | Wt 244.4 lb

## 2017-01-02 DIAGNOSIS — I255 Ischemic cardiomyopathy: Secondary | ICD-10-CM | POA: Diagnosis present

## 2017-01-02 DIAGNOSIS — I5023 Acute on chronic systolic (congestive) heart failure: Secondary | ICD-10-CM | POA: Diagnosis present

## 2017-01-02 DIAGNOSIS — I5022 Chronic systolic (congestive) heart failure: Secondary | ICD-10-CM

## 2017-01-02 DIAGNOSIS — I4891 Unspecified atrial fibrillation: Secondary | ICD-10-CM

## 2017-01-02 DIAGNOSIS — M109 Gout, unspecified: Secondary | ICD-10-CM | POA: Diagnosis present

## 2017-01-02 DIAGNOSIS — Z515 Encounter for palliative care: Secondary | ICD-10-CM | POA: Diagnosis not present

## 2017-01-02 DIAGNOSIS — I472 Ventricular tachycardia: Secondary | ICD-10-CM | POA: Diagnosis not present

## 2017-01-02 DIAGNOSIS — Z79899 Other long term (current) drug therapy: Secondary | ICD-10-CM

## 2017-01-02 DIAGNOSIS — Z888 Allergy status to other drugs, medicaments and biological substances status: Secondary | ICD-10-CM

## 2017-01-02 DIAGNOSIS — Z7982 Long term (current) use of aspirin: Secondary | ICD-10-CM

## 2017-01-02 DIAGNOSIS — I5082 Biventricular heart failure: Secondary | ICD-10-CM | POA: Diagnosis present

## 2017-01-02 DIAGNOSIS — I252 Old myocardial infarction: Secondary | ICD-10-CM | POA: Diagnosis not present

## 2017-01-02 DIAGNOSIS — K7290 Hepatic failure, unspecified without coma: Secondary | ICD-10-CM | POA: Diagnosis present

## 2017-01-02 DIAGNOSIS — I493 Ventricular premature depolarization: Secondary | ICD-10-CM

## 2017-01-02 DIAGNOSIS — Z7984 Long term (current) use of oral hypoglycemic drugs: Secondary | ICD-10-CM

## 2017-01-02 DIAGNOSIS — Z9889 Other specified postprocedural states: Secondary | ICD-10-CM | POA: Insufficient documentation

## 2017-01-02 DIAGNOSIS — I2510 Atherosclerotic heart disease of native coronary artery without angina pectoris: Secondary | ICD-10-CM | POA: Diagnosis present

## 2017-01-02 DIAGNOSIS — Z9049 Acquired absence of other specified parts of digestive tract: Secondary | ICD-10-CM | POA: Diagnosis not present

## 2017-01-02 DIAGNOSIS — Z8249 Family history of ischemic heart disease and other diseases of the circulatory system: Secondary | ICD-10-CM

## 2017-01-02 DIAGNOSIS — I471 Supraventricular tachycardia: Secondary | ICD-10-CM | POA: Diagnosis present

## 2017-01-02 DIAGNOSIS — Z6838 Body mass index (BMI) 38.0-38.9, adult: Secondary | ICD-10-CM | POA: Diagnosis not present

## 2017-01-02 DIAGNOSIS — I5033 Acute on chronic diastolic (congestive) heart failure: Secondary | ICD-10-CM | POA: Diagnosis not present

## 2017-01-02 DIAGNOSIS — G4733 Obstructive sleep apnea (adult) (pediatric): Secondary | ICD-10-CM | POA: Diagnosis present

## 2017-01-02 DIAGNOSIS — Z66 Do not resuscitate: Secondary | ICD-10-CM | POA: Diagnosis not present

## 2017-01-02 DIAGNOSIS — N183 Chronic kidney disease, stage 3 (moderate): Secondary | ICD-10-CM

## 2017-01-02 DIAGNOSIS — I509 Heart failure, unspecified: Secondary | ICD-10-CM | POA: Diagnosis not present

## 2017-01-02 DIAGNOSIS — I272 Pulmonary hypertension, unspecified: Secondary | ICD-10-CM | POA: Diagnosis not present

## 2017-01-02 DIAGNOSIS — Z7189 Other specified counseling: Secondary | ICD-10-CM | POA: Diagnosis not present

## 2017-01-02 DIAGNOSIS — Z951 Presence of aortocoronary bypass graft: Secondary | ICD-10-CM

## 2017-01-02 DIAGNOSIS — I130 Hypertensive heart and chronic kidney disease with heart failure and stage 1 through stage 4 chronic kidney disease, or unspecified chronic kidney disease: Secondary | ICD-10-CM

## 2017-01-02 DIAGNOSIS — E1122 Type 2 diabetes mellitus with diabetic chronic kidney disease: Secondary | ICD-10-CM

## 2017-01-02 DIAGNOSIS — Z87892 Personal history of anaphylaxis: Secondary | ICD-10-CM

## 2017-01-02 DIAGNOSIS — E871 Hypo-osmolality and hyponatremia: Secondary | ICD-10-CM | POA: Diagnosis not present

## 2017-01-02 DIAGNOSIS — N179 Acute kidney failure, unspecified: Secondary | ICD-10-CM | POA: Diagnosis not present

## 2017-01-02 DIAGNOSIS — E785 Hyperlipidemia, unspecified: Secondary | ICD-10-CM | POA: Diagnosis present

## 2017-01-02 DIAGNOSIS — Z87891 Personal history of nicotine dependence: Secondary | ICD-10-CM | POA: Diagnosis not present

## 2017-01-02 DIAGNOSIS — K7682 Hepatic encephalopathy: Secondary | ICD-10-CM

## 2017-01-02 DIAGNOSIS — I5084 End stage heart failure: Secondary | ICD-10-CM | POA: Diagnosis present

## 2017-01-02 DIAGNOSIS — I50810 Right heart failure, unspecified: Secondary | ICD-10-CM

## 2017-01-02 LAB — BASIC METABOLIC PANEL
Anion gap: 12 (ref 5–15)
BUN: 62 mg/dL — AB (ref 6–20)
CALCIUM: 9.4 mg/dL (ref 8.9–10.3)
CO2: 27 mmol/L (ref 22–32)
CREATININE: 1.64 mg/dL — AB (ref 0.44–1.00)
Chloride: 96 mmol/L — ABNORMAL LOW (ref 101–111)
GFR calc Af Amer: 37 mL/min — ABNORMAL LOW (ref >60)
GFR, EST NON AFRICAN AMERICAN: 32 mL/min — AB (ref >60)
GLUCOSE: 124 mg/dL — AB (ref 65–99)
Potassium: 3.8 mmol/L (ref 3.5–5.1)
Sodium: 135 mmol/L (ref 135–145)

## 2017-01-02 LAB — COOXEMETRY PANEL
Carboxyhemoglobin: 1.2 % (ref 0.5–1.5)
METHEMOGLOBIN: 1 % (ref 0.0–1.5)
O2 SAT: 70.3 %
TOTAL HEMOGLOBIN: 13.8 g/dL (ref 12.0–16.0)

## 2017-01-02 LAB — TSH: TSH: 7.19 u[IU]/mL — ABNORMAL HIGH (ref 0.350–4.500)

## 2017-01-02 LAB — BLOOD GAS, ARTERIAL
Acid-Base Excess: 6.5 mmol/L — ABNORMAL HIGH (ref 0.0–2.0)
Bicarbonate: 29.7 mmol/L — ABNORMAL HIGH (ref 20.0–28.0)
Drawn by: 257881
O2 Saturation: 93.6 %
PATIENT TEMPERATURE: 98.6
PH ART: 7.518 — AB (ref 7.350–7.450)
PO2 ART: 69 mmHg — AB (ref 83.0–108.0)
pCO2 arterial: 36.8 mmHg (ref 32.0–48.0)

## 2017-01-02 LAB — CBC
HCT: 41.2 % (ref 36.0–46.0)
Hemoglobin: 13.8 g/dL (ref 12.0–15.0)
MCH: 30.2 pg (ref 26.0–34.0)
MCHC: 33.5 g/dL (ref 30.0–36.0)
MCV: 90.2 fL (ref 78.0–100.0)
PLATELETS: 272 10*3/uL (ref 150–400)
RBC: 4.57 MIL/uL (ref 3.87–5.11)
RDW: 16.8 % — AB (ref 11.5–15.5)
WBC: 6.9 10*3/uL (ref 4.0–10.5)

## 2017-01-02 LAB — GLUCOSE, CAPILLARY: GLUCOSE-CAPILLARY: 137 mg/dL — AB (ref 65–99)

## 2017-01-02 LAB — MAGNESIUM: Magnesium: 2.4 mg/dL (ref 1.7–2.4)

## 2017-01-02 LAB — BRAIN NATRIURETIC PEPTIDE: B Natriuretic Peptide: 827.6 pg/mL — ABNORMAL HIGH (ref 0.0–100.0)

## 2017-01-02 LAB — AMMONIA: Ammonia: 109 umol/L — ABNORMAL HIGH (ref 9–35)

## 2017-01-02 MED ORDER — ATORVASTATIN CALCIUM 40 MG PO TABS
40.0000 mg | ORAL_TABLET | Freq: Every day | ORAL | Status: DC
  Administered 2017-01-02 – 2017-01-17 (×16): 40 mg via ORAL
  Filled 2017-01-02 (×16): qty 1

## 2017-01-02 MED ORDER — FUROSEMIDE 10 MG/ML IJ SOLN
60.0000 mg | Freq: Once | INTRAMUSCULAR | Status: AC
  Administered 2017-01-02: 60 mg via INTRAVENOUS
  Filled 2017-01-02: qty 6

## 2017-01-02 MED ORDER — SPIRONOLACTONE 25 MG PO TABS
25.0000 mg | ORAL_TABLET | Freq: Every day | ORAL | Status: DC
  Administered 2017-01-03 – 2017-01-17 (×15): 25 mg via ORAL
  Filled 2017-01-02 (×14): qty 1

## 2017-01-02 MED ORDER — GABAPENTIN 300 MG PO CAPS
300.0000 mg | ORAL_CAPSULE | Freq: Three times a day (TID) | ORAL | Status: DC
  Administered 2017-01-02 – 2017-01-17 (×44): 300 mg via ORAL
  Filled 2017-01-02 (×44): qty 1

## 2017-01-02 MED ORDER — ASPIRIN EC 81 MG PO TBEC
81.0000 mg | DELAYED_RELEASE_TABLET | Freq: Every day | ORAL | Status: DC
  Administered 2017-01-03 – 2017-01-17 (×15): 81 mg via ORAL
  Filled 2017-01-02 (×15): qty 1

## 2017-01-02 MED ORDER — ACETAMINOPHEN 325 MG PO TABS
650.0000 mg | ORAL_TABLET | ORAL | Status: DC | PRN
  Administered 2017-01-02 – 2017-01-16 (×4): 650 mg via ORAL
  Filled 2017-01-02 (×4): qty 2

## 2017-01-02 MED ORDER — FUROSEMIDE 10 MG/ML IJ SOLN
30.0000 mg/h | INTRAVENOUS | Status: DC
  Administered 2017-01-02: 15 mg/h via INTRAVENOUS
  Administered 2017-01-03 – 2017-01-09 (×13): 30 mg/h via INTRAVENOUS
  Filled 2017-01-02 (×36): qty 25

## 2017-01-02 MED ORDER — MILRINONE LACTATE IN DEXTROSE 20-5 MG/100ML-% IV SOLN
0.2500 ug/kg/min | INTRAVENOUS | Status: DC
  Administered 2017-01-02 – 2017-01-07 (×9): 0.25 ug/kg/min via INTRAVENOUS
  Filled 2017-01-02 (×10): qty 100

## 2017-01-02 MED ORDER — MILRINONE LACTATE IN DEXTROSE 20-5 MG/100ML-% IV SOLN: 0.2500 ug/kg/min | INTRAVENOUS | Status: DC

## 2017-01-02 MED ORDER — SODIUM CHLORIDE 0.9% FLUSH
10.0000 mL | INTRAVENOUS | Status: DC | PRN
  Administered 2017-01-02: 10 mL
  Administered 2017-01-12: 30 mL
  Filled 2017-01-02 (×2): qty 40

## 2017-01-02 MED ORDER — SODIUM CHLORIDE 0.9% FLUSH
3.0000 mL | Freq: Two times a day (BID) | INTRAVENOUS | Status: DC
  Administered 2017-01-07 – 2017-01-11 (×3): 3 mL via INTRAVENOUS

## 2017-01-02 MED ORDER — ONDANSETRON HCL 4 MG/2ML IJ SOLN
4.0000 mg | Freq: Four times a day (QID) | INTRAMUSCULAR | Status: DC | PRN
  Filled 2017-01-02: qty 2

## 2017-01-02 MED ORDER — INSULIN ASPART 100 UNIT/ML ~~LOC~~ SOLN
4.0000 [IU] | Freq: Three times a day (TID) | SUBCUTANEOUS | Status: DC
  Administered 2017-01-03 – 2017-01-14 (×29): 4 [IU] via SUBCUTANEOUS
  Administered 2017-01-14: 9 [IU] via SUBCUTANEOUS
  Administered 2017-01-15 – 2017-01-17 (×7): 4 [IU] via SUBCUTANEOUS

## 2017-01-02 MED ORDER — INSULIN ASPART 100 UNIT/ML ~~LOC~~ SOLN: 0.0000 [IU] | Freq: Every day | SUBCUTANEOUS | Status: DC

## 2017-01-02 MED ORDER — LACTULOSE 10 GM/15ML PO SOLN
20.0000 g | Freq: Three times a day (TID) | ORAL | Status: DC
  Administered 2017-01-02 – 2017-01-11 (×28): 20 g via ORAL
  Filled 2017-01-02 (×29): qty 30

## 2017-01-02 MED ORDER — ENOXAPARIN SODIUM 40 MG/0.4ML ~~LOC~~ SOLN
40.0000 mg | SUBCUTANEOUS | Status: DC
  Administered 2017-01-07 – 2017-01-10 (×2): 40 mg via SUBCUTANEOUS
  Filled 2017-01-02 (×10): qty 0.4

## 2017-01-02 MED ORDER — SODIUM CHLORIDE 0.9 % IV SOLN
250.0000 mL | INTRAVENOUS | Status: DC | PRN
  Administered 2017-01-02: 250 mL via INTRAVENOUS

## 2017-01-02 MED ORDER — INSULIN ASPART 100 UNIT/ML ~~LOC~~ SOLN
0.0000 [IU] | Freq: Three times a day (TID) | SUBCUTANEOUS | Status: DC
  Administered 2017-01-04 – 2017-01-07 (×7): 3 [IU] via SUBCUTANEOUS
  Administered 2017-01-07: 1 [IU] via SUBCUTANEOUS
  Administered 2017-01-07: 5 [IU] via SUBCUTANEOUS
  Administered 2017-01-08: 3 [IU] via SUBCUTANEOUS
  Administered 2017-01-08: 5 [IU] via SUBCUTANEOUS
  Administered 2017-01-09 (×2): 2 [IU] via SUBCUTANEOUS
  Administered 2017-01-09 – 2017-01-10 (×4): 3 [IU] via SUBCUTANEOUS
  Administered 2017-01-11 (×2): 2 [IU] via SUBCUTANEOUS
  Administered 2017-01-11: 3 [IU] via SUBCUTANEOUS
  Administered 2017-01-12 (×2): 5 [IU] via SUBCUTANEOUS
  Administered 2017-01-12: 3 [IU] via SUBCUTANEOUS
  Administered 2017-01-13 (×2): 5 [IU] via SUBCUTANEOUS
  Administered 2017-01-13 – 2017-01-14 (×2): 3 [IU] via SUBCUTANEOUS
  Administered 2017-01-15: 5 [IU] via SUBCUTANEOUS
  Administered 2017-01-15: 2 [IU] via SUBCUTANEOUS
  Administered 2017-01-15: 3 [IU] via SUBCUTANEOUS
  Administered 2017-01-16: 2 [IU] via SUBCUTANEOUS
  Administered 2017-01-16 – 2017-01-17 (×3): 3 [IU] via SUBCUTANEOUS

## 2017-01-02 MED ORDER — POTASSIUM CHLORIDE CRYS ER 20 MEQ PO TBCR
40.0000 meq | EXTENDED_RELEASE_TABLET | Freq: Every day | ORAL | Status: DC
  Administered 2017-01-03: 40 meq via ORAL
  Filled 2017-01-02: qty 2

## 2017-01-02 MED ORDER — ACETAMINOPHEN 325 MG PO TABS: 650.0000 mg | ORAL_TABLET | ORAL | Status: DC | PRN

## 2017-01-02 MED ORDER — SODIUM CHLORIDE 0.9% FLUSH: 3.0000 mL | INTRAVENOUS | Status: DC | PRN

## 2017-01-02 MED ORDER — SODIUM CHLORIDE 0.9% FLUSH
10.0000 mL | Freq: Two times a day (BID) | INTRAVENOUS | Status: DC
  Administered 2017-01-02 – 2017-01-04 (×3): 10 mL
  Administered 2017-01-08 – 2017-01-09 (×2): 20 mL
  Administered 2017-01-10: 10 mL
  Administered 2017-01-11: 20 mL
  Administered 2017-01-11: 10 mL
  Administered 2017-01-15 – 2017-01-16 (×2): 30 mL
  Administered 2017-01-16: 10 mL
  Administered 2017-01-17: 30 mL

## 2017-01-02 NOTE — Progress Notes (Signed)
Patient ID: Christine Knight, female   DOB: 1951-07-16, 66 y.o.   MRN: [id number]     Advanced Heart Failure Clinic Note   PCP: Arnette FeltsMike Duran  HF: Dr. Gala RomneyBensimhon   HPI: Christine Knight is a 66 y.o. female w/ PMHx significant for morbid obesity, systolic HF due to ICM (10/12 EF 25%; 07/2012 EF 35%; 03/18/13 EF 20-25%), CAD with h/o NSTEMI s/p CABG Oct 2012, pulmonary hypertension (PA peak 72mmHg), and poorly controlled DM 2.  She was [facility] for dyspnea, leg edema, and weight gain.  Lasix gtt used and she diuresed 15 liters. Diuresed 29 pounds.  ECHO- EF 20-25%.  Discharge weight 264 pounds.    Admitted 12/08/2015 - 12/28/2015 with worsening DOE and cough. Stated she had a 40 lb weight since 07/2015. She was diuresed aggressively with lasix gtt at 30 mg/hr and metolazone 5 mg BID. Diamox eventually added. Diuretics held temporarily towards end of stay with worsening Creatinine. Overall she diuresed >40 L and was down 70 lbs from admission weight. Pt had R/LHC with preserved CO and mild/mod Pulm HTN. Severe native CAD with patent CABG grafts. Discharge weight 237 lbs.   Admitted 6/1 -04/26/2016 with marked volume overload, low output hf, cardiorenal syndrome. Diuresed with high dose lasix 160 mg three times a day. Diuresed over 50 pounds. Transitioned to torsemide 80 mg daily. BB and losartan stopped. Discharge weight 222 pounds   Admitted 10/15 - 09/08/16 with marked volume overload. PT diuresed with high dose IV lasix, metolazone, as well as Milrinone to augment diuresis. Diamox also added.  Pt diuresed 35 L and lost 44 lbs. Pt transitioned back to po torsemide, and had to be adjusted further with AKI.  Discharge weight 224 lbs.  Admitted  12/6 through 11/02/16 with marked volume overload. Diuresed with IV lasix, metolazone, and milrinone. Overall diuresed 41 pounds. Discharged on torsemide 60 mg twice a day. Discharge weight was 211 pounds.   Pt presents today for follow up.  Up 4 lbs from last  visit despite IV lasix, and up 33 lbs from discharge. Paramedicine saw last night and pt was much more SOB and weight up 16 lbs at home.  Confused, hasn't been answering phone, unclear if she is taking medicine as directed.  Drinking > 2L daily and not watching sodium in foods.  Orthopneic, SOB with any exertion and occasionally at rest. Legs painfully swollen.   ROS: All systems negative except as above, in PMH, and in Problem list.   Labs 2/17 K 4.3, Cr 1.56 Labs 04/26/2016: K 3.1 Creatinine 1.78 K 3.1 Labs 06/05/2016: K 4.3 Creatinine 1.25 Labas 06/13/2016: K 4.0 Creatinine 1.49  Labs 07/31/2016: K 4.5 Creatinine 2.27  Labs  08/05/2016: K 5.0 Creatinine 3.18  11/02/2017: K 3.6 Creatinine 1.59   03/18/13 ECHO EF 20-25% 06/17/2013 ECHO EF 35% RV ok (read formally after visit 35-40%) 03/15/2015 ECHO EF 30-35%, grade 3 DD, Moderate TR, Severely reduced RV, PA peak pressure 81 mm Hg 12/09/2015 ECHO EF 15-20%, Decreased LV diastolic compliance, RV mod reduced, PA peak 57 mmHg, Severe TR  04/14/16 Echo 15-20%  10/2016: EF Peak PA pressure 89 mm hg  EF 35-40% RV dysfunction.    [facility] Hemodynamics RA 25/25 (23) RV pressure 92/13  RV EDP 26 PA pressure 86/34 (56) PW mean 37 AO Pressure 114/72 (88) LV Pressure 101/12 (26) LV EDP 26 PVR 3.24 Fick CO/CI 5.85 / 2.41L/min   Prox LAD lesion, 100% stenosed. Prox Cx lesion, 99% stenosed. Mid  RCA lesion, 80% stenosed. Dist RCA lesion, 100% stenosed. Patent grafts    Past Medical History:  Diagnosis Date  . CAD (coronary artery disease) 08/2011   NSTEMI with subsequent CABG 08/13/2011 with LIMA-LAD, SVG-diagonal, SVG-OM, SVG-PDA  . Carpal tunnel syndrome on both sides    "post OHS in 08/2011; resolved now" (03/17/2013)  . CHF (congestive heart failure) (HCC)   . Chronic systolic heart failure (HCC)    Chronic systolic CHF  . Diabetes mellitus type 2 in obese (HCC) 08/10/11  . Dyspnea   . Ejection fraction < 50%    EF 20%, October, 2012  //   EF 25% December, 2012  //   EF 35%, echo, September, 2013  . Hyperlipidemia 08/10/11  . Hypertension   . Ischemic cardiomyopathy 08/13/2011   EF 20% in 08/2011, still 25% 10/21/2011  . Mitral regurgitation    Mild by echo 10/21/11  . Myocardial infarction 08/2011  . Obesity, morbid (HCC)   . Orthopnoea    "progressively worse over last 3 wks" (03/17/2013)  . Pleural effusion    Requiring L thoracentesis 09/02/11  . Pulmonary hypertension    Echo, September, 2013, 72 mmHg.    Current Outpatient Prescriptions  Medication Sig Dispense Refill  . acetaminophen (TYLENOL) 325 MG tablet Take 2 tablets (650 mg total) by mouth every 4 (four) hours as needed for headache or mild pain.    Marland Kitchen aspirin 81 MG tablet Take 1 tablet (81 mg total) by mouth daily. 30 tablet 9  . atorvastatin (LIPITOR) 40 MG tablet Take 1 tablet (40 mg total) by mouth daily. 90 tablet 3  . gabapentin (NEURONTIN) 300 MG capsule Take 300 mg by mouth 3 (three) times daily.     Marland Kitchen glipiZIDE (GLUCOTROL) 10 MG tablet Take 10 mg by mouth 2 (two) times daily.    . metolazone (ZAROXOLYN) 5 MG tablet Take 1 tablet (5 mg total) by mouth once. 1 tablet 0  . Multiple Vitamins-Minerals (CENTRUM SILVER 50+WOMEN PO) Take 1 tablet by mouth every morning.    Marland Kitchen spironolactone (ALDACTONE) 25 MG tablet Take 1 tablet (25 mg total) by mouth daily. 30 tablet 5  . torsemide (DEMADEX) 20 MG tablet Take 3 tablets (60 mg total) by mouth 2 (two) times daily. 180 tablet 5   No current facility-administered medications for this encounter.      PHYSICAL EXAM: Vitals:   01/02/17 1431  BP: 120/70  BP Location: Left Arm  Patient Position: Sitting  Cuff Size: Normal  Pulse: 95  SpO2: 98%  Weight: 244 lb 6.4 oz (110.9 kg)   Wt Readings from Last 3 Encounters:  01/02/17 244 lb 6.4 oz (110.9 kg)  01/01/17 240 lb (108.9 kg)  12/25/16 262 lb (118.8 kg)     General: Obese.  In WC. Fatigued, tearful.   HEENT: Normal Neck: JVP elevated to ear. Carotids  2+ bilat; no bruits. No thyromegaly or nodule noted.  Cor: PMI nondisplaced. Distant, Irregular. No M/G/R Lungs: Diminished throughout.  Abdomen: Morbidly obese, soft, NT, mildly distended, no HSM. No bruits or masses. +BS  Extremities: no cyanosis, clubbing, rash. 2-3+ edema to thighs.     Neuro: alert & orientedx3, cranial nerves grossly intact. moves all 4 extremities w/o difficulty. Affect depressed.  EKG: Afib with PVCsrhythm 67 bpm with PVCs  ASSESSMENT/PLAN 1. Acute on Chronic systolic HF due to iCM EF 15-20% Echo 04/14/2016. - ECHO 10/2016 EF 35-40% RV dysfunction.   - Markedly volume overloaded with NYHA IIIb-IV symptoms.  -  Weight up 33 lbs since last discharge from hospital. - Will diurese with IV lasix gtt @ 15 mg hr. Will give bolus of 60 mg IV up front.  - Start milrinone 0.25 mg to augment diuresis.  - No bb with low output.  - Continue spiro 25 mg daily. - No ACE due to allergy - No Entresto due to h/o of AKI and Hyper K on same.  - No dig with CKD.  - Intolerant bidil with difficulty with headaches, dizziness and hypotension.  - Daily weights, strict I/Os, compression hose.  2. CAD s/p NSTEMI and CABG 10/12  - No CP. Continue atorvastatin 40 mg daily.  - Severe native CAD with patent grafts on [facility] 12/13/15. Continue ASA 81 mg.  3. Morbid obesity - Needs to lose weight but limited due to her significant co-morbidities.    4. Pulmonary HTN - mild to mod RHC 12/13/15. PVR 3.24.  - Needs diuresis.  5. CKD stage III - BMET today. Follow closely with diuresis.   6. DM2  - Per PCP. 7. Suspected sleep apnea. Day time fatigue-  - Completed sleep study 12/25/16. Interpretation pending.  8. Gout - Per PCP  9. Rhythm - EKG read as afib. Appears regular with PVCs, so will follow closely on telemetry - This patients CHA2DS2-VASc Score will be at least 6 if Afib diagnosed this admission.   Admitting to stepdown for IV diuresis. Will start IV lasix gtt at 15 mg hr with loading  bolus of 60 mg.  Start milrinone 0.25 mcg/kg/min.   Place PICC for CVP/Coox.     [name], PA-C 2:33 PM

## 2017-01-02 NOTE — Telephone Encounter (Signed)
Late Entry... I have been unsuccessful in trying to reach the patient concerning her sleep study results. I have called the number provided 3 times and still no return phone call. Today a no contact letter will be sent to the address listed in her chart.

## 2017-01-02 NOTE — Telephone Encounter (Signed)
-----   Message from Traci R Turner, MD sent at 12/26/2016  7:27 PM EST ----- Please let patient know that they have significant sleep apnea and had successful CPAP titration and will be set up with CPAP unit.  Please let DME know that order is in EPIC.  Please set patient up for OV in 10 weeks 

## 2017-01-02 NOTE — Addendum Note (Signed)
Encounter addended by: Chyrl CivatteMegan G Kashia Brossard, RN on: 01/02/2017  3:07 PM<BR>    Actions taken: Diagnosis association updated, Order list changed

## 2017-01-02 NOTE — H&P (Signed)
Advanced Heart Failure Team History and Physical Note   Primary Physician:  Arnette Felts, MD Primary Cardiologist:  Dr. Gala Romney   Reason for Admission: Acute on chronic systolic CHF with marked volume overload.  HPI:    Christine Knight is a 66 y.o. female w/ PMHx significant for morbid obesity, systolic HF due to ICM (10/12 EF 25%; 07/2012 EF 35%; 03/18/13 EF 20-25%), CAD with h/o NSTEMI s/p CABG Oct 2012, pulmonary hypertension (PA peak ), and poorly controlled DM 2.  She was admitted to [facility] in 2014 for dyspnea, leg edema, and weight gain.  Lasix gtt used and she diuresed 15 liters. Diuresed 29 pounds.  ECHO- EF 20-25%.  Discharge weight 264 pounds.    Admitted 12/08/2015 - 12/28/2015 with worsening DOE and cough. Stated she had a 40 lb weight since 07/2015. She was diuresed aggressively with lasix gtt at 30 mg/hr and metolazone 5 mg BID. Diamox eventually added. Diuretics held temporarily towards end of stay with worsening Creatinine. Overall she diuresed >40 L and was down 70 lbs from admission weight. Pt had R/LHC with preserved CO and mild/mod Pulm HTN. Severe native CAD with patent CABG grafts. Discharge weight 237 lbs.   Admitted 6/1 -04/26/2016 with marked volume overload, low output hf, cardiorenal syndrome. Diuresed with high dose lasix 160 mg three times a day. Diuresed over 50 pounds. Transitioned to torsemide 80 mg daily. BB and losartan stopped. Discharge weight 222 pounds   Admitted 10/15 - 09/08/16 with marked volume overload. PT diuresed with high dose IV lasix, metolazone, as well as Milrinone to augment diuresis. Diamox also added.  Pt diuresed 35 L and lost 44 lbs. Pt transitioned back to po torsemide, and had to be adjusted further with AKI.  Discharge weight 224 lbs.  Admitted  12/6 through 11/02/16 with marked volume overload. Diuresed with IV lasix, metolazone, and milrinone. Overall diuresed 41 pounds. Discharged on torsemide 60 mg twice a day. Discharge weight was 211  pounds.   Pt presents today for follow up.  Up 4 lbs from last visit despite IV lasix, and up 33 lbs from discharge. Paramedicine saw last night and pt was much more SOB and weight up 16 lbs at home.  Confused, hasn't been answering phone, unclear if she is taking medicine as directed.  Drinking > 2L daily and not watching sodium in foods.  Orthopneic, SOB with any exertion and occasionally at rest. Legs painfully swollen.   Review of Systems: [y] = yes, [ ]  = no   General: Weight gain [y]; Weight loss [ ] ; Anorexia [ ] ; Fatigue [y]; Fever [ ] ; Chills [ ] ; Weakness Cove.Etienne ]  Cardiac: Chest pain/pressure [ ] ; Resting SOB [ ] ; Exertional SOB [y]; Orthopnea [y]; Pedal Edema y]; Palpitations [ ] ; Syncope [ ] ; Presyncope [ ] ; Paroxysmal nocturnal dyspnea[ ]   Pulmonary: Cough [y]; Wheezing[ ] ; Hemoptysis[ ] ; Sputum [ ] ; Snoring [ ]   GI: Vomiting[ ] ; Dysphagia[ ] ; Melena[ ] ; Hematochezia [ ] ; Heartburn[ ] ; Abdominal pain [ ] ; Constipation [ ] ; Diarrhea [ ] ; BRBPR [ ]   GU: Hematuria[ ] ; Dysuria [ ] ; Nocturia[ ]   Vascular: Pain in legs with walking [ ] ; Pain in feet with lying flat [ ] ; Non-healing sores [ ] ; Stroke [ ] ; TIA [ ] ; Slurred speech [ ] ;  Neuro: Headaches[ ] ; Vertigo[ ] ; Seizures[ ] ; Paresthesias[ ] ;Blurred vision [ ] ; Diplopia [ ] ; Vision changes [ ]   Ortho/Skin: Arthritis [y]; Joint pain [y]; Muscle pain [ ] ; Joint swelling [ ] ; Back  Pain [ ] ; Rash [ ]   Psych: Depression[ ] ; Anxiety[ ]   Heme: Bleeding problems [ ] ; Clotting disorders [ ] ; Anemia Cove.Etienne[y ]  Endocrine: Diabetes [ ] ; Thyroid dysfunction[ ]    Home Medications Prior to Admission medications   Medication Sig Start Date End Date Taking? Authorizing Provider  acetaminophen (TYLENOL) 325 MG tablet Take 2 tablets (650 mg total) by mouth every 4 (four) hours as needed for headache or mild pain. 11/02/16   Leone BrandLaura R Ingold, NP  aspirin 81 MG tablet Take 1 tablet (81 mg total) by mouth daily. 11/15/13   Luis AbedJeffrey D Katz, MD  atorvastatin (LIPITOR)  40 MG tablet Take 1 tablet (40 mg total) by mouth daily. 11/22/16 02/20/17  Graciella FreerMichael Andrew Tillery, PA-C  gabapentin (NEURONTIN) 300 MG capsule Take 300 mg by mouth 3 (three) times daily.     Historical Provider, MD  glipiZIDE (GLUCOTROL) 10 MG tablet Take 10 mg by mouth 2 (two) times daily.    Historical Provider, MD  metolazone (ZAROXOLYN) 5 MG tablet Take 1 tablet (5 mg total) by mouth once. 01/01/17 01/01/17  Graciella FreerMichael Andrew Tillery, PA-C  Multiple Vitamins-Minerals (CENTRUM SILVER 50+WOMEN PO) Take 1 tablet by mouth every morning.    Historical Provider, MD  spironolactone (ALDACTONE) 25 MG tablet Take 1 tablet (25 mg total) by mouth daily. 11/06/16   Dolores Pattyaniel R Jc Veron, MD  torsemide (DEMADEX) 20 MG tablet Take 3 tablets (60 mg total) by mouth 2 (two) times daily. 12/20/16   Dolores Pattyaniel R Jshaun Abernathy, MD    Past Medical History: Past Medical History:  Diagnosis Date  . CAD (coronary artery disease) 08/2011   NSTEMI with subsequent CABG 08/13/2011 with LIMA-LAD, SVG-diagonal, SVG-OM, SVG-PDA  . Carpal tunnel syndrome on both sides    "post OHS in 08/2011; resolved now" (03/17/2013)  . CHF (congestive heart failure) (HCC)   . Chronic systolic heart failure (HCC)    Chronic systolic CHF  . Diabetes mellitus type 2 in obese (HCC) 08/10/11  . Dyspnea   . Ejection fraction < 50%    EF 20%, October, 2012  //  EF 25% December, 2012  //   EF 35%, echo, September, 2013  . Hyperlipidemia 08/10/11  . Hypertension   . Ischemic cardiomyopathy 08/13/2011   EF 20% in 08/2011, still 25% 10/21/2011  . Mitral regurgitation    Mild by echo 10/21/11  . Myocardial infarction 08/2011  . Obesity, morbid (HCC)   . Orthopnoea    "progressively worse over last 3 wks" (03/17/2013)  . Pleural effusion    Requiring L thoracentesis 09/02/11  . Pulmonary hypertension    Echo, September, 2013, 72 mmHg.    Past Surgical History: Past Surgical History:  Procedure Laterality Date  . CARDIAC CATHETERIZATION N/A 12/13/2015    Procedure: Right/Left Heart Cath and Coronary/Graft Angiography;  Surgeon: Peter M SwazilandJordan, MD;  [facility];  Service: Cardiovascular;  Laterality: N/A;  . CHOLECYSTECTOMY    . CORONARY ARTERY BYPASS GRAFT  08/13/11   CABG x4 with LIMA to LAD, SVG to Diag, SVG to OM, SVG to PDA, EVH via both thighs  . LAPAROSCOPY  1980's?   "removed gallstones; not gallbladder" (03/17/2013)    Family History:  Family History  Problem Relation Age of Onset  . Heart disease Father   . Hypertension Maternal Grandmother   . Hypertension Sister   . Cancer Neg Hx   . Diabetes Neg Hx   . Kidney disease Neg Hx     Social  History: Social History   Social History  . Marital status: Divorced    Spouse name: N/A  . Number of children: 3  . 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 Smoking status: Former Smoker    Types: Cigarettes    Quit date: 11/11/1978  . Smokeless tobacco: Never Used     Comment: 03/17/2013 "only a social smoker when I did smoke; ever bought any"  . Alcohol use Yes     Comment: OCCASIONAL  . Drug use: No  . Sexual activity: Not Currently    Birth control/ protection: Post-menopausal   Other Topics Concern  . Not on file   Social History Narrative   Lives with her sister and daughter.  Ambulates independently.    Allergies:  Allergies  Allergen Reactions  . Lisinopril Nausea And Vomiting, Other (See Comments) and Cough    Coughing, throwing up, waking up coughing  . Metformin And Related Anaphylaxis    Objective:    Vital Signs:    BP 120/70 (BP Location: Left Arm, Patient Position: Sitting, Cuff Size: Normal)   Pulse 95   Wt 244 lb 6.4 oz (110.9 kg)   SpO2 98%   BMI 39.45 kg/m   BSA 2.27 m   Physical Exam General: Obese.  In WC. Fatigued, tearful.   HEENT: Normal Neck: JVP elevated to ear. Carotids 2+ bilat; no bruits. No thyromegaly or nodule noted.  Cor: PMI nondisplaced. Distant,  Irregular. No M/G/R Lungs: Diminished throughout.  Abdomen: Morbidly obese, soft, NT, mildly distended, no HSM. No bruits or masses. +BS  Extremities: no cyanosis, clubbing, rash. 2-3+ edema to thighs.     Neuro: alert & oriented x 2. Somewhat confused. Cranial nerves grossly intact. moves all 4 extremities w/o difficulty. Affect depressed.  EKG: Afib with PVCsrhythm 67 bpm with PVCs  Telemetry: Not yet connected  Labs: Basic Metabolic Panel: No results for input(s): NA, K, CL, CO2, GLUCOSE, BUN, CREATININE, CALCIUM, MG, PHOS in the last 168 hours.  Liver Function Tests: No results for input(s): AST, ALT, ALKPHOS, BILITOT, PROT, ALBUMIN in the last 168 hours. No results for input(s): LIPASE, AMYLASE in the last 168 hours. No results for input(s): AMMONIA in the last 168 hours.  CBC: No results for input(s): WBC, NEUTROABS, HGB, HCT, MCV, PLT in the last 168 hours.  Cardiac Enzymes: No results for input(s): CKTOTAL, CKMB, CKMBINDEX, TROPONINI in the last 168 hours.  BNP: BNP (last 3 results)  Recent Labs  10/16/16 1512 11/21/16 1550 12/06/16 1008  BNP 903.7* 1,037.5* 854.6*    ProBNP (last 3 results) No results for input(s): PROBNP in the last 8760 hours.   CBG: No results for input(s): GLUCAP in the last 168 hours.  Coagulation Studies: No results for input(s): LABPROT, INR in the last 72 hours.  Other results: EKG: Afib with PVC or aberrantly conducted complexes  Imaging:  No results found.   Assessment/Plan   1. Acute on Chronic systolic HF due to iCM EF 15-20% Echo 04/14/2016. - ECHO 10/2016 EF 35-40% RV dysfunction.   - Markedly volume overloaded with NYHA IIIb-IV symptoms.  - Weight up 33 lbs since last discharge from hospital. - Will diurese with IV lasix gtt @ 15 mg hr. Will give bolus of 60 mg IV up front.  - Start milrinone 0.25 mg to augment diuresis.  - No bb with low output.  - Continue spiro 25 mg daily. -  No ACE due to allergy - No  Entresto due to h/o of AKI and Hyper K on same.  - No dig with CKD.  - Intolerant bidil with difficulty with headaches, dizziness and hypotension.  - Daily weights, strict I/Os, compression hose.  2. CAD s/p NSTEMI and CABG 10/12  - No CP. Continue atorvastatin 40 mg daily.  - Severe native CAD with patent grafts on [facility] 12/13/15. Continue ASA 81 mg.  3. Morbid obesity - Needs to lose weight but limited due to her significant co-morbidities.    4. Pulmonary HTN - mild to mod RHC 12/13/15. PVR 3.24.  - Needs diuresis.  5. CKD stage III - BMET today. Follow closely with diuresis.   6. DM2  - Per PCP. 7. Suspected sleep apnea. Day time fatigue-  - Completed sleep study 12/25/16 with significant sleep apnea had successful CPAP titration and awaiting unit.  8. Gout - Per PCP  9. Rhythm - EKG read as afib. Appears regular with PVCs, so will follow closely on telemetry - This patients CHA2DS2-VASc Score will be at least 6 if Afib diagnosed this admission.   Admitting to stepdown for IV diuresis. Will start IV lasix gtt at 15 mg hr with loading bolus of 60 mg.  Start milrinone 0.25 mcg/kg/min.   Place PICC for CVP/Coox.    10. Hepatic encephalopathy --Due to chronic R-sided HF   With confusion, will also add ABG.   Length of Stay: 0   Luane School 01/02/2017, 3:12 PM  Advanced Heart Failure Team Pager [phone] (M-F; 7a - 4p)  Please contact [facility] for night-coverage after hours (4p -7a ) and weekends on [email or web address]  Patient seen and examined with Otilio Saber, PA-C. We discussed all aspects of the encounter. I agree with the assessment and plan as stated above.   Christine Knight is a 66 y/o woman with severe HF with R>>L symptoms. Has had multiple admissions for massive volume overload and cardiorenal syndrome requiring milrinone and high dose lasix drip. Now with recurrent volume overload - up at least 40 pounds. Also with evidence of hepatic encephalopathy. Will start  milrinone and lasix gtt. (will likely need to increase lasix to 30). Start lactulose 20 tid. She is getting close to the point where Hospice may be best option.   Rayne Cowdrey,MD 4:42 PM

## 2017-01-02 NOTE — Progress Notes (Signed)
Peripherally Inserted Central Catheter/Midline Placement  The IV Nurse has discussed with the patient and/or persons authorized to consent for the patient, the purpose of this procedure and the potential benefits and risks involved with this procedure.  The benefits include less needle sticks, lab draws from the catheter, and the patient may be discharged home with the catheter. Risks include, but not limited to, infection, bleeding, blood clot (thrombus formation), and puncture of an artery; nerve damage and irregular heartbeat and possibility to perform a PICC exchange if needed/ordered by physician.  Alternatives to this procedure were also discussed.  Bard Power PICC patient education guide, fact sheet on infection prevention and patient information card has been provided to patient /or left at bedside.    PICC/Midline Placement Documentation  PICC Double Lumen 01/02/17 PICC Right Cephalic 40 cm 0 cm (Active)  Indication for Insertion or Continuance of Line Vasoactive infusions 01/02/2017  8:47 PM  Exposed Catheter (cm) 0 cm 01/02/2017  8:47 PM  Site Assessment Clean;Dry;Intact 01/02/2017  8:47 PM  Lumen #1 Status Flushed;Saline locked;Blood return noted 01/02/2017  8:47 PM  Lumen #2 Status Flushed;Saline locked;Blood return noted 01/02/2017  8:47 PM  Dressing Type Transparent 01/02/2017  8:47 PM  Dressing Status Clean;Dry;Intact 01/02/2017  8:47 PM  Dressing Change Due 01/09/17 01/02/2017  8:47 PM       Ethelda Chickurrie, Casondra Gasca Robert 01/02/2017, 8:49 PM

## 2017-01-02 NOTE — Progress Notes (Signed)
Rn notified PICC may not be placed until 01-03-17.

## 2017-01-03 ENCOUNTER — Other Ambulatory Visit (HOSPITAL_COMMUNITY): Payer: Self-pay | Admitting: Internal Medicine

## 2017-01-03 ENCOUNTER — Telehealth: Payer: Self-pay | Admitting: *Deleted

## 2017-01-03 ENCOUNTER — Inpatient Hospital Stay (HOSPITAL_COMMUNITY): Payer: Medicare Other

## 2017-01-03 DIAGNOSIS — K7290 Hepatic failure, unspecified without coma: Secondary | ICD-10-CM

## 2017-01-03 DIAGNOSIS — K7682 Hepatic encephalopathy: Secondary | ICD-10-CM

## 2017-01-03 LAB — CBC WITH DIFFERENTIAL/PLATELET
BASOS ABS: 0 10*3/uL (ref 0.0–0.1)
Basophils Relative: 1 %
EOS ABS: 0.2 10*3/uL (ref 0.0–0.7)
Eosinophils Relative: 3 %
HCT: 37.9 % (ref 36.0–46.0)
HEMOGLOBIN: 12.1 g/dL (ref 12.0–15.0)
LYMPHS ABS: 0.9 10*3/uL (ref 0.7–4.0)
LYMPHS PCT: 17 %
MCH: 28.9 pg (ref 26.0–34.0)
MCHC: 31.9 g/dL (ref 30.0–36.0)
MCV: 90.7 fL (ref 78.0–100.0)
Monocytes Absolute: 0.7 10*3/uL (ref 0.1–1.0)
Monocytes Relative: 13 %
NEUTROS PCT: 66 %
Neutro Abs: 3.5 10*3/uL (ref 1.7–7.7)
Platelets: 244 10*3/uL (ref 150–400)
RBC: 4.18 MIL/uL (ref 3.87–5.11)
RDW: 16.8 % — ABNORMAL HIGH (ref 11.5–15.5)
WBC: 5.2 10*3/uL (ref 4.0–10.5)

## 2017-01-03 LAB — GLUCOSE, CAPILLARY
GLUCOSE-CAPILLARY: 103 mg/dL — AB (ref 65–99)
Glucose-Capillary: 180 mg/dL — ABNORMAL HIGH (ref 65–99)
Glucose-Capillary: 209 mg/dL — ABNORMAL HIGH (ref 65–99)
Glucose-Capillary: 94 mg/dL (ref 65–99)

## 2017-01-03 LAB — BASIC METABOLIC PANEL
Anion gap: 9 (ref 5–15)
BUN: 63 mg/dL — ABNORMAL HIGH (ref 6–20)
CHLORIDE: 93 mmol/L — AB (ref 101–111)
CO2: 30 mmol/L (ref 22–32)
Calcium: 9 mg/dL (ref 8.9–10.3)
Creatinine, Ser: 1.48 mg/dL — ABNORMAL HIGH (ref 0.44–1.00)
GFR, EST AFRICAN AMERICAN: 42 mL/min — AB (ref >60)
GFR, EST NON AFRICAN AMERICAN: 36 mL/min — AB (ref >60)
Glucose, Bld: 138 mg/dL — ABNORMAL HIGH (ref 65–99)
POTASSIUM: 3 mmol/L — AB (ref 3.5–5.1)
SODIUM: 132 mmol/L — AB (ref 135–145)

## 2017-01-03 LAB — AMMONIA: AMMONIA: 57 umol/L — AB (ref 9–35)

## 2017-01-03 LAB — COOXEMETRY PANEL
CARBOXYHEMOGLOBIN: 1.4 % (ref 0.5–1.5)
METHEMOGLOBIN: 1.1 % (ref 0.0–1.5)
O2 Saturation: 76.3 %
TOTAL HEMOGLOBIN: 12.6 g/dL (ref 12.0–16.0)

## 2017-01-03 MED ORDER — POTASSIUM CHLORIDE CRYS ER 20 MEQ PO TBCR
40.0000 meq | EXTENDED_RELEASE_TABLET | Freq: Once | ORAL | Status: AC
  Administered 2017-01-03: 40 meq via ORAL
  Filled 2017-01-03: qty 2

## 2017-01-03 MED ORDER — POTASSIUM CHLORIDE CRYS ER 20 MEQ PO TBCR
40.0000 meq | EXTENDED_RELEASE_TABLET | Freq: Two times a day (BID) | ORAL | Status: DC
  Administered 2017-01-03 – 2017-01-11 (×17): 40 meq via ORAL
  Filled 2017-01-03 (×18): qty 2

## 2017-01-03 NOTE — Telephone Encounter (Signed)
-----   Message from Traci R Turner, MD sent at 12/26/2016  7:27 PM EST ----- Please let patient know that they have significant sleep apnea and had successful CPAP titration and will be set up with CPAP unit.  Please let DME know that order is in EPIC.  Please set patient up for OV in 10 weeks 

## 2017-01-03 NOTE — Progress Notes (Signed)
Advanced Heart Failure Rounding Note  Primary Cardiologist: Dr. Gala Romney   Subjective:    Admitted from HF clinic 01/02/17 with recurrent marked volume overload and hepatic encephalopathy (ammonia 109).   Feeling somewhat better this am. Remains markedly volume overload. Confusion cleared up somewhat.   Coox stable 76.3%. CVP 26. Out 1.8 L thus far.  Weight 238 lbs (down 6 lbs from clinic weight)  K 3.0. Creatinine 1.48.    CXR with underlying congestive heart failure.    Objective:   Weight Range: 238 lb 12.8 oz (108.3 kg) Body mass index is 38.54 kg/m.   Vital Signs:   Temp:  [97.5 F (36.4 C)-98.6 F (37 C)] 98 F (36.7 C) (02/23 0724) Pulse Rate:  [60-89] 89 (02/23 0724) Resp:  [13-23] 14 (02/23 0724) BP: (96-117)/(58-88) 113/77 (02/23 0724) SpO2:  [95 %-100 %] 95 % (02/23 0724) Weight:  [238 lb 12.8 oz (108.3 kg)] 238 lb 12.8 oz (108.3 kg) (02/23 0404) Last BM Date: 01/01/17  Weight change: Filed Weights   01/03/17 0404  Weight: 238 lb 12.8 oz (108.3 kg)    Intake/Output:   Intake/Output Summary (Last 24 hours) at 01/03/17 0943 Last data filed at 01/03/17 0900  Gross per 24 hour  Intake           824.64 ml  Output             2700 ml  Net         -1875.36 ml     Physical Exam: General:  Obese. Sitting on side of bed. NAD.  HEENT: Normal Neck: supple. JVP difficult but appears to ear. Carotids 2+ bilat; no bruits. No lymphadenopathy or thyromegaly appreciated. Cor: PMI nondisplaced. Irregular. No rubs, gallops. 2/6 TR Lungs: Decreased throughout.  Abdomen: soft, nontender, ++ distended. No hepatosplenomegaly. No bruits or masses. Good bowel sounds. Extremities: no cyanosis, clubbing, rash. Marked 2-3+ edema into thighs.  Neuro: alert & orientedx3, cranial nerves grossly intact. moves all 4 extremities w/o difficulty. Affect pleasant  Telemetry: Personally reviewed, Frequent Ectopy but p waves present.  Appears to be in NSR currently, with  underlying atrial arrythmia at times.   Labs: CBC  Recent Labs  01/02/17 1505 01/03/17 0453  WBC 6.9 5.2  NEUTROABS  --  3.5  HGB 13.8 12.1  HCT 41.2 37.9  MCV 90.2 90.7  PLT 272 244   Basic Metabolic Panel  Recent Labs  01/02/17 1505 01/03/17 0453  NA 135 132*  K 3.8 3.0*  CL 96* 93*  CO2 27 30  GLUCOSE 124* 138*  BUN 62* 63*  CREATININE 1.64* 1.48*  CALCIUM 9.4 9.0  MG 2.4  --    Liver Function Tests No results for input(s): AST, ALT, ALKPHOS, BILITOT, PROT, ALBUMIN in the last 72 hours. No results for input(s): LIPASE, AMYLASE in the last 72 hours. Cardiac Enzymes No results for input(s): CKTOTAL, CKMB, CKMBINDEX, TROPONINI in the last 72 hours.  BNP: BNP (last 3 results)  Recent Labs  11/21/16 1550 12/06/16 1008 01/02/17 1507  BNP 1,037.5* 854.6* 827.6*    ProBNP (last 3 results) No results for input(s): PROBNP in the last 8760 hours.   D-Dimer No results for input(s): DDIMER in the last 72 hours. Hemoglobin A1C No results for input(s): HGBA1C in the last 72 hours. Fasting Lipid Panel No results for input(s): CHOL, HDL, LDLCALC, TRIG, CHOLHDL, LDLDIRECT in the last 72 hours. Thyroid Function Tests  Recent Labs  01/02/17 1509  TSH 7.190*  Other results:     Imaging/Studies:  Dg Chest 2 View  Result Date: 01/03/2017 CLINICAL DATA:  Congestive heart failure.  Weakness. EXAM: CHEST  2 VIEW COMPARISON:  January 02, 2017 FINDINGS: Central catheter tip is in superior vena cava. No pneumothorax. There is interstitial pulmonary edema with small left pleural effusion. There is cardiomegaly with pulmonary venous hypertension. There are areas of patchy atelectatic change in both upper lobes. There is also atelectatic change in the left base. There is no frank airspace consolidation. No adenopathy. There is atherosclerotic calcification in the aorta. No bone lesions. IMPRESSION: Underlying congestive heart failure. Areas of atelectatic change.  Aortic atherosclerosis. Central catheter tip in superior vena cava. No evident pneumothorax. Electronically Signed   By: Bretta BangWilliam  Woodruff III M.D.   On: 01/03/2017 08:52   Dg Chest Port 1 View  Result Date: 01/02/2017 CLINICAL DATA:  Initial evaluation for line placement. EXAM: PORTABLE CHEST 1 VIEW COMPARISON:  Prior radiograph from 10/17/2016. FINDINGS: Median sternotomy wires with underlying CABG markers and surgical clips noted. Cardiomegaly is stable. Mediastinal silhouette within normal limits. Aortic atherosclerosis noted. Right-sided PICC catheter in place overlying the proximal-mid SVC. Tip at the level the carina. Also, the tip courses somewhat medially. Bedside check to ensure venous placement is recommended. Lungs normally inflated. Mild diffuse pulmonary edema. Probable small left pleural effusion. No definite focal infiltrates. No pneumothorax. No acute osseous abnormality. IMPRESSION: 1. Tip of right PICC catheter overlying the carina, likely in the proximal-mid SVC. However, the tip does course somewhat medially, and possible arterial placement is not entirely excluded. Bedside check to ensure venous placement is recommended. 2. Cardiomegaly with mild diffuse pulmonary edema and probable small left pleural effusion. Electronically Signed   By: Rise MuBenjamin  McClintock M.D.   On: 01/02/2017 21:15       Medications:     Scheduled Medications: . aspirin EC  81 mg Oral Daily  . atorvastatin  40 mg Oral Daily  . enoxaparin (LOVENOX) injection  40 mg Subcutaneous Q24H  . gabapentin  300 mg Oral TID  . insulin aspart  0-15 Units Subcutaneous TID WC  . insulin aspart  0-5 Units Subcutaneous QHS  . insulin aspart  4 Units Subcutaneous TID WC  . lactulose  20 g Oral TID  . potassium chloride  40 mEq Oral BID  . potassium chloride  40 mEq Oral Once  . sodium chloride flush  10-40 mL Intracatheter Q12H  . sodium chloride flush  3 mL Intravenous Q12H  . spironolactone  25 mg Oral Daily      Infusions: . furosemide (LASIX) infusion 15 mg/hr (01/02/17 2244)  . milrinone 0.25 mcg/kg/min (01/02/17 2244)     PRN Medications:  sodium chloride, acetaminophen, ondansetron (ZOFRAN) IV, sodium chloride flush, sodium chloride flush   Assessment/Plan   1. Acute on chronic systolic CHF due to ICM EF 35-40% 10/2016 2. CAD s/p NSTEMI and CABG 08/2011 3. Hepatic encephalopathy 4. CKD Stage III 5. Pulmonary HTN 6. DM2 7. OSA 8. Gout 9. Cardiac arrythmia 10. Morbid Obesity  Diuresing some but CVP 26.  Increase lasix gtt to 30 mg/hr as she has had good response on this before.  Add metolazone as needed.   Coox stable. Continue mirlinone 0.25 mcg/kg/min while diuresing. Had to add dopamine as well last admission.    Mental status clearing. Continue lactulose. Check ammonia.   Follow creatinine closely with diuresis.   Will order CPAP with recently diagnoses OSA. Place Ted hose.  Follow telemetry closely.  May eventually need anticoag.  CHA2DS2/VASc would be 6 if Afib. Would hold off for now with complicated picture including renal disease and significant HF.  Low threshold to start. With questionable compliance would choose NOAC over coumadin.   Length of Stay: 1  Luane School  01/03/2017, 9:43 AM   Advanced Heart Failure Team Pager [phone] (M-F; 7a - 4p)  Please contact [facility] for night-coverage after hours (4p -7a ) and weekends on [email or web address]  Patient seen and examined with Otilio Saber, PA-C. We discussed all aspects of the encounter. I agree with the assessment and plan as stated above.   She remains markedly fluid overloaded with sluggish response to milrinone and lasix gtt. Will increase lasix to 30/hr. Will likely need metolazone +/- dopamine. Renal function currently stable. Follow closely with diuresis. Watch electrolytes.   Hepatic encephalopathy related to RHF. Agree with lactulose.  Continue CPAP for OSA.  Rhythm like sinus  with frequent PACs but can't exclude periods of AF. May need to consider Eliquis.   Rhyatt Muska,MD 10:09 AM

## 2017-01-03 NOTE — Telephone Encounter (Signed)
I have called the patients daughter at the number listed in her chart and the patients son at the number listed in the chart and neither are working numbers and the patient hasn't answered the number listed for her neither has she returned my calls. I have sent a letter to the address listed.

## 2017-01-04 ENCOUNTER — Encounter (HOSPITAL_COMMUNITY): Payer: Self-pay | Admitting: *Deleted

## 2017-01-04 DIAGNOSIS — I272 Pulmonary hypertension, unspecified: Secondary | ICD-10-CM

## 2017-01-04 LAB — CBC WITH DIFFERENTIAL/PLATELET
BASOS ABS: 0 10*3/uL (ref 0.0–0.1)
Basophils Relative: 0 %
EOS PCT: 4 %
Eosinophils Absolute: 0.2 10*3/uL (ref 0.0–0.7)
HCT: 35.8 % — ABNORMAL LOW (ref 36.0–46.0)
Hemoglobin: 11.5 g/dL — ABNORMAL LOW (ref 12.0–15.0)
LYMPHS PCT: 15 %
Lymphs Abs: 0.8 10*3/uL (ref 0.7–4.0)
MCH: 29 pg (ref 26.0–34.0)
MCHC: 32.1 g/dL (ref 30.0–36.0)
MCV: 90.2 fL (ref 78.0–100.0)
Monocytes Absolute: 0.6 10*3/uL (ref 0.1–1.0)
Monocytes Relative: 11 %
NEUTROS ABS: 3.7 10*3/uL (ref 1.7–7.7)
Neutrophils Relative %: 70 %
Platelets: 223 10*3/uL (ref 150–400)
RBC: 3.97 MIL/uL (ref 3.87–5.11)
RDW: 16.5 % — ABNORMAL HIGH (ref 11.5–15.5)
WBC: 5.4 10*3/uL (ref 4.0–10.5)

## 2017-01-04 LAB — BASIC METABOLIC PANEL
ANION GAP: 10 (ref 5–15)
BUN: 55 mg/dL — AB (ref 6–20)
CO2: 31 mmol/L (ref 22–32)
Calcium: 9 mg/dL (ref 8.9–10.3)
Chloride: 90 mmol/L — ABNORMAL LOW (ref 101–111)
Creatinine, Ser: 1.41 mg/dL — ABNORMAL HIGH (ref 0.44–1.00)
GFR calc Af Amer: 44 mL/min — ABNORMAL LOW (ref >60)
GFR, EST NON AFRICAN AMERICAN: 38 mL/min — AB (ref >60)
Glucose, Bld: 206 mg/dL — ABNORMAL HIGH (ref 65–99)
POTASSIUM: 3.9 mmol/L (ref 3.5–5.1)
SODIUM: 131 mmol/L — AB (ref 135–145)

## 2017-01-04 LAB — GLUCOSE, CAPILLARY
Glucose-Capillary: 153 mg/dL — ABNORMAL HIGH (ref 65–99)
Glucose-Capillary: 122 mg/dL — ABNORMAL HIGH (ref 65–99)
Glucose-Capillary: 160 mg/dL — ABNORMAL HIGH (ref 65–99)
Glucose-Capillary: 105 mg/dL — ABNORMAL HIGH (ref 65–99)

## 2017-01-04 LAB — COOXEMETRY PANEL
CARBOXYHEMOGLOBIN: 1.6 % — AB (ref 0.5–1.5)
METHEMOGLOBIN: 1 % (ref 0.0–1.5)
O2 SAT: 82.6 %
Total hemoglobin: 11.8 g/dL — ABNORMAL LOW (ref 12.0–16.0)

## 2017-01-04 LAB — AMMONIA: Ammonia: 71 umol/L — ABNORMAL HIGH (ref 9–35)

## 2017-01-04 NOTE — Progress Notes (Signed)
Advanced Heart Failure Rounding Note  Primary Cardiologist: Dr. Gala RomneyBensimhon   Subjective:    Admitted from HF clinic 01/02/17 with recurrent marked volume overload and hepatic encephalopathy (ammonia 109). Admit weight 244.   Out 4L yesterday after increase of lasix gtt. Weight down only 2 pounds however. Says she feels better but still swollen SOB improved. No orthopnea.   Coox 83%. CVP 22  K 3.9. Creatinine 1.48-> 1.41.     Objective:   Weight Range: 107 kg (236 lb) Body mass index is 38.09 kg/m.   Vital Signs:   Temp:  [98.2 F (36.8 C)-98.9 F (37.2 C)] 98.5 F (36.9 C) (02/24 0433) Pulse Rate:  [66-77] 66 (02/24 0433) Resp:  [16-24] 17 (02/24 0433) BP: (93-114)/(52-71) 109/63 (02/24 0433) SpO2:  [94 %-99 %] 95 % (02/24 0433) Weight:  [107 kg (236 lb)] 107 kg (236 lb) (02/24 0600) Last BM Date: 01/03/17  Weight change: Filed Weights   01/03/17 0404 01/04/17 0600  Weight: 108.3 kg (238 lb 12.8 oz) 107 kg (236 lb)    Intake/Output:   Intake/Output Summary (Last 24 hours) at 01/04/17 0802 Last data filed at 01/04/17 0700  Gross per 24 hour  Intake          1294.25 ml  Output             4101 ml  Net         -2806.75 ml     Physical Exam: General:  Obese. Sitting in chair . NAD.  HEENT: Normal Neck: supple. JVP difficult but appears to ear. Carotids 2+ bilat; no bruits. No lymphadenopathy or thyromegaly appreciated. Cor: PMI nondisplaced. Irregular. No rubs, gallops. 2/6 TR Lungs: Decreased throughout.  Abdomen: soft, nontender, ++ distended. No hepatosplenomegaly. No bruits or masses. Good bowel sounds. Extremities: no cyanosis, clubbing, rash. Marked 3+ edema into thighs.  Neuro: alert & orientedx3, cranial nerves grossly intact. moves all 4 extremities w/o difficulty. Affect pleasant  Telemetry: Probable NSR with frequent PACs. No clear AF. Personally reviewed   Labs: CBC  Recent Labs  01/03/17 0453 01/04/17 0336  WBC 5.2 5.4  NEUTROABS 3.5  3.7  HGB 12.1 11.5*  HCT 37.9 35.8*  MCV 90.7 90.2  PLT 244 223   Basic Metabolic Panel  Recent Labs  01/02/17 1505 01/03/17 0453 01/04/17 0336  NA 135 132* 131*  K 3.8 3.0* 3.9  CL 96* 93* 90*  CO2 27 30 31   GLUCOSE 124* 138* 206*  BUN 62* 63* 55*  CREATININE 1.64* 1.48* 1.41*  CALCIUM 9.4 9.0 9.0  MG 2.4  --   --    Liver Function Tests No results for input(s): AST, ALT, ALKPHOS, BILITOT, PROT, ALBUMIN in the last 72 hours. No results for input(s): LIPASE, AMYLASE in the last 72 hours. Cardiac Enzymes No results for input(s): CKTOTAL, CKMB, CKMBINDEX, TROPONINI in the last 72 hours.  BNP: BNP (last 3 results)  Recent Labs  11/21/16 1550 12/06/16 1008 01/02/17 1507  BNP 1,037.5* 854.6* 827.6*    ProBNP (last 3 results) No results for input(s): PROBNP in the last 8760 hours.   D-Dimer No results for input(s): DDIMER in the last 72 hours. Hemoglobin A1C No results for input(s): HGBA1C in the last 72 hours. Fasting Lipid Panel No results for input(s): CHOL, HDL, LDLCALC, TRIG, CHOLHDL, LDLDIRECT in the last 72 hours. Thyroid Function Tests  Recent Labs  01/02/17 1509  TSH 7.190*    Other results:     Imaging/Studies:  Dg  Chest 2 View  Result Date: 01/03/2017 CLINICAL DATA:  Congestive heart failure.  Weakness. EXAM: CHEST  2 VIEW COMPARISON:  January 02, 2017 FINDINGS: Central catheter tip is in superior vena cava. No pneumothorax. There is interstitial pulmonary edema with small left pleural effusion. There is cardiomegaly with pulmonary venous hypertension. There are areas of patchy atelectatic change in both upper lobes. There is also atelectatic change in the left base. There is no frank airspace consolidation. No adenopathy. There is atherosclerotic calcification in the aorta. No bone lesions. IMPRESSION: Underlying congestive heart failure. Areas of atelectatic change. Aortic atherosclerosis. Central catheter tip in superior vena cava. No  evident pneumothorax. Electronically Signed   By: Bretta Bang III M.D.   On: 01/03/2017 08:52      Medications:     Scheduled Medications: . aspirin EC  81 mg Oral Daily  . atorvastatin  40 mg Oral Daily  . enoxaparin (LOVENOX) injection  40 mg Subcutaneous Q24H  . gabapentin  300 mg Oral TID  . insulin aspart  0-15 Units Subcutaneous TID WC  . insulin aspart  0-5 Units Subcutaneous QHS  . insulin aspart  4 Units Subcutaneous TID WC  . lactulose  20 g Oral TID  . potassium chloride  40 mEq Oral BID  . sodium chloride flush  10-40 mL Intracatheter Q12H  . sodium chloride flush  3 mL Intravenous Q12H  . spironolactone  25 mg Oral Daily    Infusions: . furosemide (LASIX) infusion 30 mg/hr (01/03/17 2300)  . milrinone 0.25 mcg/kg/min (01/04/17 0657)    PRN Medications: sodium chloride, acetaminophen, ondansetron (ZOFRAN) IV, sodium chloride flush, sodium chloride flush   Assessment/Plan   1. Acute on chronic systolic CHF due to ICM EF 35-40% 10/2016 - with R>>L heart failure - end-stage 2. CAD s/p NSTEMI and CABG 08/2011 3. Hepatic encephalopathy 4. CKD Stage III 5. Pulmonary HTN    --primarily pulmonary venous HTN  6. DM2 7. OSA 8. Gout 9. Cardiac arrythmia 10. Morbid Obesity  She remains markedly fluid overloaded. Now with improved diuresis on lasix gtt at 30 and milrinone 0.25. Can add metolazone as needed. During las hospitalization also required dopamine. Renal function currently stable. Follow closely with diuresis. Watch electrolytes. We again discussed need to drink less fluid as she is diuresing well but weight not down much.   Hepatic encephalopathy related to RHF. Improving with lactulose.   Continue CPAP for OSA.  Rhythm like sinus with frequent PACs but can't exclude periods of AF. May need to consider Eliquis.   Bensimhon, Daniel,MD 8:02 AM  Length of Stay: 2  Arvilla Meres, MD  01/04/2017, 8:02 AM   Advanced Heart Failure Team Pager  [phone] (M-F; 7a - 4p)  Please contact [facility] for night-coverage after hours (4p -7a ) and weekends on [email or web address]

## 2017-01-04 NOTE — Plan of Care (Signed)
Problem: Fluid Volume: Goal: Ability to maintain a balanced intake and output will improve Outcome: Progressing Patient educated on the importance of limited oral intake of fluids due to disease process. Patient understanding and agreeable to limiting intake.

## 2017-01-05 LAB — CBC WITH DIFFERENTIAL/PLATELET
BASOS PCT: 0 %
Basophils Absolute: 0 10*3/uL (ref 0.0–0.1)
Eosinophils Absolute: 0.2 10*3/uL (ref 0.0–0.7)
Eosinophils Relative: 5 %
HEMATOCRIT: 36.5 % (ref 36.0–46.0)
HEMOGLOBIN: 11.7 g/dL — AB (ref 12.0–15.0)
LYMPHS PCT: 16 %
Lymphs Abs: 0.9 10*3/uL (ref 0.7–4.0)
MCH: 29.1 pg (ref 26.0–34.0)
MCHC: 32.1 g/dL (ref 30.0–36.0)
MCV: 90.8 fL (ref 78.0–100.0)
MONOS PCT: 12 %
Monocytes Absolute: 0.6 10*3/uL (ref 0.1–1.0)
NEUTROS ABS: 3.6 10*3/uL (ref 1.7–7.7)
NEUTROS PCT: 67 %
Platelets: 216 10*3/uL (ref 150–400)
RBC: 4.02 MIL/uL (ref 3.87–5.11)
RDW: 16.5 % — ABNORMAL HIGH (ref 11.5–15.5)
WBC: 5.3 10*3/uL (ref 4.0–10.5)

## 2017-01-05 LAB — AMMONIA: AMMONIA: 90 umol/L — AB (ref 9–35)

## 2017-01-05 LAB — BASIC METABOLIC PANEL
ANION GAP: 12 (ref 5–15)
BUN: 52 mg/dL — ABNORMAL HIGH (ref 6–20)
CALCIUM: 9 mg/dL (ref 8.9–10.3)
CHLORIDE: 89 mmol/L — AB (ref 101–111)
CO2: 32 mmol/L (ref 22–32)
Creatinine, Ser: 1.32 mg/dL — ABNORMAL HIGH (ref 0.44–1.00)
GFR calc Af Amer: 48 mL/min — ABNORMAL LOW (ref >60)
GFR calc non Af Amer: 41 mL/min — ABNORMAL LOW (ref >60)
Glucose, Bld: 132 mg/dL — ABNORMAL HIGH (ref 65–99)
POTASSIUM: 4.1 mmol/L (ref 3.5–5.1)
Sodium: 133 mmol/L — ABNORMAL LOW (ref 135–145)

## 2017-01-05 LAB — GLUCOSE, CAPILLARY
GLUCOSE-CAPILLARY: 170 mg/dL — AB (ref 65–99)
GLUCOSE-CAPILLARY: 94 mg/dL (ref 65–99)
GLUCOSE-CAPILLARY: 166 mg/dL — AB (ref 65–99)
Glucose-Capillary: 130 mg/dL — ABNORMAL HIGH (ref 65–99)

## 2017-01-05 LAB — COOXEMETRY PANEL
CARBOXYHEMOGLOBIN: 2 % — AB (ref 0.5–1.5)
Methemoglobin: 0.8 % (ref 0.0–1.5)
O2 SAT: 67.3 %
Total hemoglobin: 12.2 g/dL (ref 12.0–16.0)

## 2017-01-05 MED ORDER — METOLAZONE 5 MG PO TABS
5.0000 mg | ORAL_TABLET | Freq: Every day | ORAL | Status: DC
  Administered 2017-01-05 – 2017-01-06 (×2): 5 mg via ORAL
  Filled 2017-01-05 (×2): qty 1

## 2017-01-05 NOTE — Progress Notes (Addendum)
Advanced Heart Failure Rounding Note  Primary Cardiologist: Dr. Gala Romney   Subjective:    Admitted from HF clinic 01/02/17 with recurrent marked volume overload and hepatic encephalopathy (ammonia 109). Admit weight 244.   Remains on lasix gtt at 30 and milrinone. Out 5L yesterday. Weight down only 2 pounds again however. Still bloated. Denies dyspnea or orthopnea. Feels dizzy. Says lactulose not producing BMs. Just gas  SBP 90s-105   Coox 67%. CVP 22-> 20  K 3.9. Creatinine 1.48-> 1.41. > 1.3   Objective:   Weight Range: 106.1 kg (234 lb) Body mass index is 37.77 kg/m.   Vital Signs:   Temp:  [97.7 F (36.5 C)-98.7 F (37.1 C)] 98 F (36.7 C) (02/25 0600) Pulse Rate:  [69-89] 89 (02/25 0017) Resp:  [17-20] 18 (02/25 0017) BP: (106-117)/(58-80) 117/58 (02/25 0017) SpO2:  [94 %-98 %] 96 % (02/25 0017) Weight:  [106.1 kg (234 lb)] 106.1 kg (234 lb) (02/25 0600) Last BM Date: 01/04/17  Weight change: Filed Weights   01/03/17 0404 01/04/17 0600 01/05/17 0600  Weight: 108.3 kg (238 lb 12.8 oz) 107 kg (236 lb) 106.1 kg (234 lb)    Intake/Output:   Intake/Output Summary (Last 24 hours) at 01/05/17 0803 Last data filed at 01/05/17 0745  Gross per 24 hour  Intake           1460.9 ml  Output             5350 ml  Net          -3889.1 ml     Physical Exam: General:  Obese. Sitting in chair.  NAD.  HEENT: Normal Neck: supple. JVP difficult but appears to ear. Carotids 2+ bilat; no bruits. No lymphadenopathy or thyromegaly appreciated. Cor: PMI nondisplaced. Irregular. No rubs, gallops. 2/6 TR Lungs: Decreased throughout.  No wheeze Abdomen: soft, nontender, +distended. No hepatosplenomegaly. No bruits or masses. Good bowel sounds. Extremities: no cyanosis, clubbing, rash. Marked 3+ edema into thighs.  Neuro: alert & orientedx3, cranial nerves grossly intact. moves all 4 extremities w/o difficulty. Affect pleasant  Telemetry: NSR with frequent PACs and PVCs. No  clear AF. Personally reviewed   Labs: CBC  Recent Labs  01/04/17 0336 01/05/17 0500  WBC 5.4 5.3  NEUTROABS 3.7 3.6  HGB 11.5* 11.7*  HCT 35.8* 36.5  MCV 90.2 90.8  PLT 223 216   Basic Metabolic Panel  Recent Labs  01/02/17 1505  01/04/17 0336 01/05/17 0500  NA 135  < > 131* 133*  K 3.8  < > 3.9 4.1  CL 96*  < > 90* 89*  CO2 27  < > 31 32  GLUCOSE 124*  < > 206* 132*  BUN 62*  < > 55* 52*  CREATININE 1.64*  < > 1.41* 1.32*  CALCIUM 9.4  < > 9.0 9.0  MG 2.4  --   --   --   < > = values in this interval not displayed. Liver Function Tests No results for input(s): AST, ALT, ALKPHOS, BILITOT, PROT, ALBUMIN in the last 72 hours. No results for input(s): LIPASE, AMYLASE in the last 72 hours. Cardiac Enzymes No results for input(s): CKTOTAL, CKMB, CKMBINDEX, TROPONINI in the last 72 hours.  BNP: BNP (last 3 results)  Recent Labs  11/21/16 1550 12/06/16 1008 01/02/17 1507  BNP 1,037.5* 854.6* 827.6*    ProBNP (last 3 results) No results for input(s): PROBNP in the last 8760 hours.   D-Dimer No results for input(s): DDIMER in  the last 72 hours. Hemoglobin A1C No results for input(s): HGBA1C in the last 72 hours. Fasting Lipid Panel No results for input(s): CHOL, HDL, LDLCALC, TRIG, CHOLHDL, LDLDIRECT in the last 72 hours. Thyroid Function Tests  Recent Labs  01/02/17 1509  TSH 7.190*    Other results:     Imaging/Studies:  No results found.    Medications:     Scheduled Medications: . aspirin EC  81 mg Oral Daily  . atorvastatin  40 mg Oral Daily  . enoxaparin (LOVENOX) injection  40 mg Subcutaneous Q24H  . gabapentin  300 mg Oral TID  . insulin aspart  0-15 Units Subcutaneous TID WC  . insulin aspart  0-5 Units Subcutaneous QHS  . insulin aspart  4 Units Subcutaneous TID WC  . lactulose  20 g Oral TID  . potassium chloride  40 mEq Oral BID  . sodium chloride flush  10-40 mL Intracatheter Q12H  . sodium chloride flush  3 mL  Intravenous Q12H  . spironolactone  25 mg Oral Daily    Infusions: . furosemide (LASIX) infusion 30 mg/hr (01/04/17 1851)  . milrinone 0.25 mcg/kg/min (01/05/17 0745)    PRN Medications: sodium chloride, acetaminophen, ondansetron (ZOFRAN) IV, sodium chloride flush, sodium chloride flush   Assessment/Plan   1. Acute on chronic systolic CHF due to ICM EF 35-40% 10/2016 - with R>>L heart failure - end-stage 2. CAD s/p NSTEMI and CABG 08/2011 3. Hepatic encephalopathy 4. CKD Stage III 5. Pulmonary HTN    --primarily pulmonary venous HTN  6. DM2 7. OSA 8. Gout 9. Cardiac arrythmia 10. Morbid Obesity  She remains markedly fluid overloaded. Now with improved diuresis on lasix gtt at 30 and milrinone 0.25. She is peeing 4-5L per day but only losing 2 pounds daily. I once again discussed with her the need to limit fluid intake. Renal function improved. Will add a dose of metolazone. BP is very soft. May need midodrine support if SBP < 90.   Hepatic encephalopathy related to RHF. More clear but ammonia remains at 90. Can increase lactulose to 45 tid as needed  Continue CPAP for OSA.  Rhythm like sinus with frequent PACs/PVCs but can't exclude periods of AF. May need to consider Eliquis.   Bensimhon, Daniel,MD 8:02 AM  Length of Stay: 3    Advanced Heart Failure Team Pager [phone] (M-F; 7a - 4p)  Please contact [facility] for night-coverage after hours (4p -7a ) and weekends on [email or web address]

## 2017-01-05 NOTE — Progress Notes (Signed)
Pt placed on Auto CPAP max 15cmH20/min 5cmH20 with nasal mask. Pt tolerating well at this time. RT will continue to monitor.

## 2017-01-06 LAB — BASIC METABOLIC PANEL
ANION GAP: 9 (ref 5–15)
Anion gap: 8 (ref 5–15)
BUN: 41 mg/dL — AB (ref 6–20)
BUN: 39 mg/dL — AB (ref 6–20)
CALCIUM: 7.6 mg/dL — AB (ref 8.9–10.3)
CO2: 26 mmol/L (ref 22–32)
CO2: 28 mmol/L (ref 22–32)
CREATININE: 1.07 mg/dL — AB (ref 0.44–1.00)
Calcium: 7.5 mg/dL — ABNORMAL LOW (ref 8.9–10.3)
Chloride: 88 mmol/L — ABNORMAL LOW (ref 101–111)
Chloride: 83 mmol/L — ABNORMAL LOW (ref 101–111)
Creatinine, Ser: 1.12 mg/dL — ABNORMAL HIGH (ref 0.44–1.00)
GFR calc Af Amer: 58 mL/min — ABNORMAL LOW (ref >60)
GFR calc non Af Amer: 50 mL/min — ABNORMAL LOW (ref >60)
GFR, EST NON AFRICAN AMERICAN: 53 mL/min — AB (ref >60)
Glucose, Bld: 160 mg/dL — ABNORMAL HIGH (ref 65–99)
Glucose, Bld: 406 mg/dL — ABNORMAL HIGH (ref 65–99)
POTASSIUM: 2.9 mmol/L — AB (ref 3.5–5.1)
Potassium: 2.9 mmol/L — ABNORMAL LOW (ref 3.5–5.1)
Sodium: 123 mmol/L — ABNORMAL LOW (ref 135–145)
Sodium: 119 mmol/L — CL (ref 135–145)

## 2017-01-06 LAB — SODIUM: Sodium: 133 mmol/L — ABNORMAL LOW (ref 135–145)

## 2017-01-06 LAB — COOXEMETRY PANEL
CARBOXYHEMOGLOBIN: 2.1 % — AB (ref 0.5–1.5)
METHEMOGLOBIN: 0.7 % (ref 0.0–1.5)
O2 Saturation: 68 %
Total hemoglobin: 10.4 g/dL — ABNORMAL LOW (ref 12.0–16.0)

## 2017-01-06 LAB — GLUCOSE, CAPILLARY
GLUCOSE-CAPILLARY: 157 mg/dL — AB (ref 65–99)
GLUCOSE-CAPILLARY: 152 mg/dL — AB (ref 65–99)
GLUCOSE-CAPILLARY: 157 mg/dL — AB (ref 65–99)
GLUCOSE-CAPILLARY: 106 mg/dL — AB (ref 65–99)

## 2017-01-06 LAB — AMMONIA: Ammonia: 76 umol/L — ABNORMAL HIGH (ref 9–35)

## 2017-01-06 MED ORDER — TOLVAPTAN 15 MG PO TABS
15.0000 mg | ORAL_TABLET | Freq: Once | ORAL | Status: AC
  Administered 2017-01-06: 15 mg via ORAL
  Filled 2017-01-06: qty 1

## 2017-01-06 MED ORDER — POTASSIUM CHLORIDE CRYS ER 20 MEQ PO TBCR
40.0000 meq | EXTENDED_RELEASE_TABLET | Freq: Once | ORAL | Status: AC
  Administered 2017-01-06: 40 meq via ORAL
  Filled 2017-01-06: qty 2

## 2017-01-06 NOTE — Progress Notes (Signed)
Advanced Heart Failure Rounding Note  Primary Cardiologist: Dr. Gala RomneyBensimhon   Subjective:    Admitted from HF clinic 01/02/17 with recurrent marked volume overload and hepatic encephalopathy (ammonia 109). Admit weight 244.   Remains on lasix gtt at 30 and milrinone 0.25 mcg/kg/min.    Coox 68%. Out 3.8 L and down 6 lbs. CVP remains elevated at 19-20.  Still bloated. Legs remain swollen.  SBPs in 95-110 range.  5L yesterday. Weight down only 2 pounds again however. Still bloated. Denies dyspnea or orthopnea.  SBP 90s-105  Creatinine 1.48-> 1.41. > 1.3 > 1.12. Potassium 2.9. Supp ordered.  Objective:   Weight Range: 228 lb 14.4 oz (103.8 kg) Body mass index is 36.95 kg/m.   Vital Signs:   Temp:  [97.9 F (36.6 C)-98.5 F (36.9 C)] 98.4 F (36.9 C) (02/26 0432) Pulse Rate:  [75-93] 79 (02/25 2300) Resp:  [15-19] 19 (02/26 0432) BP: (95-123)/(61-77) 110/70 (02/26 0432) SpO2:  [98 %] 98 % (02/26 0431) Weight:  [228 lb 14.4 oz (103.8 kg)] 228 lb 14.4 oz (103.8 kg) (02/26 0400) Last BM Date: 01/05/17 (per pt report)  Weight change: Filed Weights   01/04/17 0600 01/05/17 0600 01/06/17 0400  Weight: 236 lb (107 kg) 234 lb (106.1 kg) 228 lb 14.4 oz (103.8 kg)    Intake/Output:   Intake/Output Summary (Last 24 hours) at 01/06/17 0953 Last data filed at 01/06/17 0500  Gross per 24 hour  Intake           1250.9 ml  Output             5000 ml  Net          -3749.1 ml     Physical Exam: General:  Obese. Sitting in chair.  NAD.  HEENT: Normal Neck: supple. JVP difficult but appears to ear. Carotids 2+ bilat; no bruits. No lymphadenopathy or thyromegaly appreciated. Cor: PMI nondisplaced. Irregular. No rubs, gallops. 2/6 TR Lungs: Decreased throughout.  No wheeze Abdomen: soft, nontender, +distended. No hepatosplenomegaly. No bruits or masses. Good bowel sounds. Extremities: no cyanosis, clubbing, rash. Marked 3+ edema into thighs.  Neuro: alert & orientedx3, cranial  nerves grossly intact. moves all 4 extremities w/o difficulty. Affect pleasant  Telemetry: Personally reviewed, NSR with frequent Ectopy (PACs and PVCs).  No clear Atrial Fib.   Labs: CBC  Recent Labs  01/04/17 0336 01/05/17 0500  WBC 5.4 5.3  NEUTROABS 3.7 3.6  HGB 11.5* 11.7*  HCT 35.8* 36.5  MCV 90.2 90.8  PLT 223 216   Basic Metabolic Panel  Recent Labs  01/05/17 0500 01/06/17 0430  NA 133* 123*  K 4.1 2.9*  CL 89* 88*  CO2 32 26  GLUCOSE 132* 160*  BUN 52* 41*  CREATININE 1.32* 1.12*  CALCIUM 9.0 7.5*   Liver Function Tests No results for input(s): AST, ALT, ALKPHOS, BILITOT, PROT, ALBUMIN in the last 72 hours. No results for input(s): LIPASE, AMYLASE in the last 72 hours. Cardiac Enzymes No results for input(s): CKTOTAL, CKMB, CKMBINDEX, TROPONINI in the last 72 hours.  BNP: BNP (last 3 results)  Recent Labs  11/21/16 1550 12/06/16 1008 01/02/17 1507  BNP 1,037.5* 854.6* 827.6*    ProBNP (last 3 results) No results for input(s): PROBNP in the last 8760 hours.   D-Dimer No results for input(s): DDIMER in the last 72 hours. Hemoglobin A1C No results for input(s): HGBA1C in the last 72 hours. Fasting Lipid Panel No results for input(s): CHOL, HDL, LDLCALC, TRIG,  CHOLHDL, LDLDIRECT in the last 72 hours. Thyroid Function Tests No results for input(s): TSH, T4TOTAL, T3FREE, THYROIDAB in the last 72 hours.  Invalid input(s): FREET3  Other results:     Imaging/Studies:  No results found.    Medications:     Scheduled Medications: . aspirin EC  81 mg Oral Daily  . atorvastatin  40 mg Oral Daily  . enoxaparin (LOVENOX) injection  40 mg Subcutaneous Q24H  . gabapentin  300 mg Oral TID  . insulin aspart  0-15 Units Subcutaneous TID WC  . insulin aspart  0-5 Units Subcutaneous QHS  . insulin aspart  4 Units Subcutaneous TID WC  . lactulose  20 g Oral TID  . metolazone  5 mg Oral Daily  . potassium chloride  40 mEq Oral BID  . sodium  chloride flush  10-40 mL Intracatheter Q12H  . sodium chloride flush  3 mL Intravenous Q12H  . spironolactone  25 mg Oral Daily    Infusions: . furosemide (LASIX) infusion 30 mg/hr (01/06/17 0712)  . milrinone 0.25 mcg/kg/min (01/06/17 0826)    PRN Medications: sodium chloride, acetaminophen, ondansetron (ZOFRAN) IV, sodium chloride flush, sodium chloride flush   Assessment/Plan   1. Acute on chronic systolic CHF due to ICM EF 35-40% 10/2016 - with R>>L heart failure - end-stage 2. CAD s/p NSTEMI and CABG 08/2011 3. Hepatic encephalopathy 4. CKD Stage III 5. Pulmonary HTN    --primarily pulmonary venous HTN  6. DM2 7. OSA 8. Gout 9. Cardiac arrythmia 10. Morbid Obesity 11. Hyponatremia  She remains markedly fluid overloaded but diuresing well so far on lasix gtt at 30 mg/hr and milrinone 0.25 mcg/kg/min.   Continue 5 mg metolazone daily.  Supp K.  Can consider diamox with good response before.    Hepatic encephalopathy related to RHF. Ammonia improving. Now 76. Continue lactulose. can increase as needed.   Sodium 123.  Restrict free water. Watch closely with metolazone. Recheck BMET this afternoon with K supp and hyponatremia.   Continue CPAP for OSA.  Rhythm like sinus with frequent PACs/PVCs but can't exclude periods of AF. May need to consider Eliquis. No change to current plan.   [name], PA-C  01/06/17 10:04 AM   Length of Stay: 4  Advanced Heart Failure Team Pager [phone] (M-F; 7a - 4p)  Please contact [facility] for night-coverage after hours (4p -7a ) and weekends on [email or web address]  Patient seen and examined with Otilio Saber, PA-C. We discussed all aspects of the encounter. I agree with the assessment and plan as stated above.   She is diuresing but remains markedly volume overloaded. Renal function improved.  Sodium dropping rapidly. Will add tolvaptan. Restrict free water. Supp Kcl aggressively. Continue milrinone.   Mental status  clearing. Ammonia down. Continue lactulose.   Wyland Rastetter,MD 3:17 PM

## 2017-01-06 NOTE — Plan of Care (Signed)
Problem: Safety: Goal: Ability to remain free from injury will improve Outcome: Progressing [facility] to bathroom due to attached equipment

## 2017-01-06 NOTE — Progress Notes (Signed)
CRITICAL VALUE ALERT  Critical value received:  Sodium (119)  Date of notification:  01/06/17  Time of notification:  1354  Critical value read back:Yes.    Nurse who received alert:  Tommy MedalJosh Maleiyah Releford  MD notified (1st page):  Otilio SaberAndy Tillery  Time of first page:  1400 MD notified (2nd page):  Time of second page:  Responding MD:  Otilio SaberAndy Tillery  Time MD responded:  [phone]

## 2017-01-06 NOTE — Care Management Important Message (Signed)
Important Message  Patient Details  Name: Lum BabeBarbara Nunnelley MRN: [id number] Date of Birth: 02-15-1951   Medicare Important Message Given:  Yes    Kyla BalzarineShealy, Shamon Cothran Abena 01/06/2017, 1:35 PM

## 2017-01-06 NOTE — Progress Notes (Signed)
Sodium 119.   Hold further metolazone for now. Restrict free water already ordered.  Will add dose of Tolvaptan.   Casimiro NeedleMichael [address]Andy" Newportillery, PA-C 01/06/2017 2:40 PM

## 2017-01-07 DIAGNOSIS — E871 Hypo-osmolality and hyponatremia: Secondary | ICD-10-CM

## 2017-01-07 LAB — BASIC METABOLIC PANEL
Anion gap: 12 (ref 5–15)
BUN: 46 mg/dL — AB (ref 6–20)
CALCIUM: 9.2 mg/dL (ref 8.9–10.3)
CO2: 32 mmol/L (ref 22–32)
Chloride: 89 mmol/L — ABNORMAL LOW (ref 101–111)
Creatinine, Ser: 1.29 mg/dL — ABNORMAL HIGH (ref 0.44–1.00)
GFR calc Af Amer: 49 mL/min — ABNORMAL LOW (ref >60)
GFR, EST NON AFRICAN AMERICAN: 42 mL/min — AB (ref >60)
GLUCOSE: 127 mg/dL — AB (ref 65–99)
Potassium: 3.6 mmol/L (ref 3.5–5.1)
Sodium: 133 mmol/L — ABNORMAL LOW (ref 135–145)

## 2017-01-07 LAB — COOXEMETRY PANEL
CARBOXYHEMOGLOBIN: 1.8 % — AB (ref 0.5–1.5)
Methemoglobin: 1 % (ref 0.0–1.5)
O2 Saturation: 78.6 %
Total hemoglobin: 11.9 g/dL — ABNORMAL LOW (ref 12.0–16.0)

## 2017-01-07 LAB — AMMONIA: Ammonia: 38 umol/L — ABNORMAL HIGH (ref 9–35)

## 2017-01-07 LAB — GLUCOSE, CAPILLARY
GLUCOSE-CAPILLARY: 183 mg/dL — AB (ref 65–99)
GLUCOSE-CAPILLARY: 216 mg/dL — AB (ref 65–99)
GLUCOSE-CAPILLARY: 152 mg/dL — AB (ref 65–99)

## 2017-01-07 MED ORDER — MILRINONE LACTATE IN DEXTROSE 20-5 MG/100ML-% IV SOLN
0.1250 ug/kg/min | INTRAVENOUS | Status: DC
  Administered 2017-01-07 – 2017-01-09 (×4): 0.25 ug/kg/min via INTRAVENOUS
  Administered 2017-01-10: 0.125 ug/kg/min via INTRAVENOUS
  Filled 2017-01-07 (×5): qty 100

## 2017-01-07 NOTE — Progress Notes (Signed)
When ask pt refuse NIV for the night.

## 2017-01-07 NOTE — Progress Notes (Signed)
Advanced Heart Failure Rounding Note  Primary Cardiologist: Dr. Gala Romney   Subjective:    Admitted from HF clinic 01/02/17 with recurrent marked volume overload and hepatic encephalopathy (ammonia 109). Admit weight 244.   Remains on lasix gtt at 30 and milrinone 0.25 mcg/kg/min.    Coox 78.6%. Out 3.2 L and down 7 lbs. CVP down slightly at 17-18.   Got a dose of tolvaptan yesterday of Na 119. Now 133. Feeling better this am. Legs coming down.  SBPs stable in 90-110 range. Denies dyspnea or orthopnea. Says she was up all night peeing.   Creatinine 1.48-> 1.41. > 1.3 > 1.12 -> 1.29. Potassium 3.6.  Objective:   Weight Range: 221 lb 12.8 oz (100.6 kg) Body mass index is 35.8 kg/m.   Vital Signs:   Temp:  [97.5 F (36.4 C)-98.6 F (37 C)] 98 F (36.7 C) (02/27 0853) Pulse Rate:  [71-83] 77 (02/27 0853) Resp:  [17-22] 19 (02/27 0853) BP: (94-118)/(53-83) 94/53 (02/27 0853) SpO2:  [92 %-97 %] 92 % (02/27 0853) Weight:  [221 lb 12.8 oz (100.6 kg)] 221 lb 12.8 oz (100.6 kg) (02/27 0427) Last BM Date: 01/06/17  Weight change: Filed Weights   01/05/17 0600 01/06/17 0400 01/07/17 0427  Weight: 234 lb (106.1 kg) 228 lb 14.4 oz (103.8 kg) 221 lb 12.8 oz (100.6 kg)    Intake/Output:   Intake/Output Summary (Last 24 hours) at 01/07/17 0900 Last data filed at 01/07/17 1610  Gross per 24 hour  Intake           1677.5 ml  Output             5050 ml  Net          -3372.5 ml     Physical Exam: CVP 18 General:  Obese. Sitting on edge of bed. NAD.   HEENT: Normal Neck: supple. JVP difficult due to body habitus. Appears elevated to ear. Carotids 2+ bilat; no bruits. No thyromegaly or nodule noted.  Cor: PMI nondisplaced. Irregular. No rubs, gallops. 2/6 TR Lungs: Diminished throughout. Clear.  No wheezes.  Abdomen: soft, NT, mildly distended, no HSM. No bruits or masses. +BS  Extremities: no cyanosis, clubbing, rash. 1+ edema into thighs.   Neuro: alert & orientedx3,  cranial nerves grossly intact. moves all 4 extremities w/o difficulty. Affect pleasant  Telemetry: Personally reviewed, NSR with frequent PACs.   Labs: CBC  Recent Labs  01/05/17 0500  WBC 5.3  NEUTROABS 3.6  HGB 11.7*  HCT 36.5  MCV 90.8  PLT 216   Basic Metabolic Panel  Recent Labs  01/06/17 1327 01/06/17 2234 01/07/17 0524  NA 119* 133* 133*  K 2.9*  --  3.6  CL 83*  --  89*  CO2 28  --  32  GLUCOSE 406*  --  127*  BUN 39*  --  46*  CREATININE 1.07*  --  1.29*  CALCIUM 7.6*  --  9.2   Liver Function Tests No results for input(s): AST, ALT, ALKPHOS, BILITOT, PROT, ALBUMIN in the last 72 hours. No results for input(s): LIPASE, AMYLASE in the last 72 hours. Cardiac Enzymes No results for input(s): CKTOTAL, CKMB, CKMBINDEX, TROPONINI in the last 72 hours.  BNP: BNP (last 3 results)  Recent Labs  11/21/16 1550 12/06/16 1008 01/02/17 1507  BNP 1,037.5* 854.6* 827.6*    ProBNP (last 3 results) No results for input(s): PROBNP in the last 8760 hours.   D-Dimer No results for input(s): DDIMER in  the last 72 hours. Hemoglobin A1C No results for input(s): HGBA1C in the last 72 hours. Fasting Lipid Panel No results for input(s): CHOL, HDL, LDLCALC, TRIG, CHOLHDL, LDLDIRECT in the last 72 hours. Thyroid Function Tests No results for input(s): TSH, T4TOTAL, T3FREE, THYROIDAB in the last 72 hours.  Invalid input(s): FREET3  Other results:     Imaging/Studies:  No results found.    Medications:     Scheduled Medications: . aspirin EC  81 mg Oral Daily  . atorvastatin  40 mg Oral Daily  . enoxaparin (LOVENOX) injection  40 mg Subcutaneous Q24H  . gabapentin  300 mg Oral TID  . insulin aspart  0-15 Units Subcutaneous TID WC  . insulin aspart  0-5 Units Subcutaneous QHS  . insulin aspart  4 Units Subcutaneous TID WC  . lactulose  20 g Oral TID  . potassium chloride  40 mEq Oral BID  . sodium chloride flush  10-40 mL Intracatheter Q12H  . sodium  chloride flush  3 mL Intravenous Q12H  . spironolactone  25 mg Oral Daily    Infusions: . furosemide (LASIX) infusion 30 mg/hr (01/07/17 0736)  . milrinone 0.25 mcg/kg/min (01/07/17 0736)    PRN Medications: sodium chloride, acetaminophen, ondansetron (ZOFRAN) IV, sodium chloride flush, sodium chloride flush   Assessment/Plan   1. Acute on chronic systolic CHF due to ICM EF 35-40% 10/2016 - with R>>L heart failure - end-stage 2. CAD s/p NSTEMI and CABG 08/2011 3. Hepatic encephalopathy 4. CKD Stage III 5. Pulmonary HTN    --primarily pulmonary venous HTN  6. DM2 7. OSA 8. Gout 9. Cardiac arrythmia 10. Morbid Obesity 11. Hyponatremia  She remains fluid overloaded but improving.  Remains on lasix gtt at 30 mg/hr and milrinone 0.25 mcg/kg/min. CVP 17-18  Holding metolazone with hyponatremia yesterday. Keep K supp going. Bicarb stable so no role for diamox currently.     Hepatic encephalopathy related to RHF. Ammonia improving. Now 38. Continue lactulose.  Sodium back up to 133 with dose of [place] yesterday. Continue to restrict free water.   Continue CPAP for OSA.  Rhythm like sinus with frequent PACs/PVCs but can't exclude periods of AF. May need to consider Eliquis. No change to current plan.   Likely 2-3 more days of diuresis.   Graciella FreerMichael Andrew Tillery, PA-C  01/07/17 9:00 AM   Length of Stay: 5  Advanced Heart Failure Team Pager [phone] (M-F; 7a - 4p)  Please contact [facility] for night-coverage after hours (4p -7a ) and weekends on [email or web address]  Patient seen and examined with Otilio SaberAndy Tillery, PA-C. We discussed all aspects of the encounter. I agree with the assessment and plan as stated above.   Volume status improving but still 10-15 pounds up from baseline. CVP measured personally and is 18. Co-ox ok. Continue IV lasix and milrinone. Supp K+  Hyponatremia improved with 1 dose tolvaptan. Will follow. Can repeat as needed. Told her to limit free H2O intake.    Hepatic encephalopathy much improved. Ammonia down to 38. Continue lactulose. Can back down to bid if needed.   Remains in NSR with frequent PACs.   Bensimhon, Daniel,MD 10:12 AM

## 2017-01-07 NOTE — Plan of Care (Signed)
Problem: Cardiac: Goal: Ability to achieve and maintain adequate cardiopulmonary perfusion will improve Outcome: Progressing Daily weights and strict I&O being measured. Patient understands need for fluid restriction. Patient is still receiving IV lasix and IV milrinone. CVP measurements q shift.

## 2017-01-07 NOTE — Progress Notes (Signed)
Patient has done well today. Very independent, up ad lib without assist. She has a very good attitude, has no complaint of pain, expressed wish to be able to go home but understands the need for hospitalized care. Vital signs are stable, pt following reduced fluid intake. Patient has been up to chair for most of the day, no visitors at this time. Call bell in reach, no questions or concerns.

## 2017-01-07 NOTE — Care Management Note (Addendum)
Case Management Note  Patient Details  Name: [name] MRN: [id number] Date of Birth: Dec 08, 1950  Subjective/Objective:   Pt presented for recurrent volume overload. Initiated on IV Milrinone and IV Lasix gtt. PTA independent-[facility]). Pt also was being seen by Para Medicine team with Heart Failure.   Action/Plan: [facility] and [facility]. CM will continue to monitor for additional home needs.   Expected Discharge Date:                  Expected Discharge Plan:  Home w Home Health Services  In-House Referral:  NA  Discharge planning Services  CM Consult  Post Acute Care Choice:   N/A Choice offered to:   N/A  DME Arranged:   NA DME Agency:    N/A  HH Arranged:   (Patient Refused)N/A HH Agency:   N/A  Status of Service: Completed.  If discussed at Long Length of Stay Meetings, dates discussed: 01-16-17    Additional Comments: 1338 01-17-17 Tomi BambergerBrenda Graves-Bigelow, RN,BSN [phone] [facility]. Para Medicine team and Heart Failure to monitor. Per pt if she needs services she will contact the Heart Failure Team. No further needs from CM at this time.   01-17-17 [id number] Benefits Check completed:   S/W RONNIE @ [employer] # [phone]   RIFAXIMIN  550 MG BID  COVER- NOT COVER  PRIOR APPROVAL- YES # [phone] FOR EXCEPTION   ALTERNATIVE :  XIFAXAN  COVER- YES  CO-PAY- 25 % OF TOTAL COAST  TIER- 5 DRUG  PRIOR APPROVAL- YES # [phone]   [facility]    01-10-17 [address] Areli Frary Graves-Bigelow, RN,BSN [phone] CM did speak with pt again [facility]. Pt's daughter at bedside. Pt states she is independent and her sister will be in the home intermittently for assistance. Pt is refusing HH Services at this time. CM wanted to see if pt would  reconsider changing agencies, however not a option at this time. Will continue to f/u.  Gala LewandowskyGraves-Bigelow, Maisey Deandrade Kaye, RN 01/07/2017, 3:01 PM

## 2017-01-08 LAB — BASIC METABOLIC PANEL
ANION GAP: 8 (ref 5–15)
BUN: 49 mg/dL — ABNORMAL HIGH (ref 6–20)
CHLORIDE: 88 mmol/L — AB (ref 101–111)
CO2: 34 mmol/L — AB (ref 22–32)
Calcium: 9.2 mg/dL (ref 8.9–10.3)
Creatinine, Ser: 1.26 mg/dL — ABNORMAL HIGH (ref 0.44–1.00)
GFR calc non Af Amer: 44 mL/min — ABNORMAL LOW (ref >60)
GFR, EST AFRICAN AMERICAN: 51 mL/min — AB (ref >60)
Glucose, Bld: 170 mg/dL — ABNORMAL HIGH (ref 65–99)
POTASSIUM: 3.6 mmol/L (ref 3.5–5.1)
SODIUM: 130 mmol/L — AB (ref 135–145)

## 2017-01-08 LAB — COOXEMETRY PANEL
Carboxyhemoglobin: 2.3 % — ABNORMAL HIGH (ref 0.5–1.5)
METHEMOGLOBIN: 0.8 % (ref 0.0–1.5)
O2 SAT: 72.9 %
TOTAL HEMOGLOBIN: 11.9 g/dL — AB (ref 12.0–16.0)

## 2017-01-08 LAB — GLUCOSE, CAPILLARY
GLUCOSE-CAPILLARY: 161 mg/dL — AB (ref 65–99)
GLUCOSE-CAPILLARY: 202 mg/dL — AB (ref 65–99)
GLUCOSE-CAPILLARY: 109 mg/dL — AB (ref 65–99)
Glucose-Capillary: 192 mg/dL — ABNORMAL HIGH (ref 65–99)

## 2017-01-08 LAB — AMMONIA: AMMONIA: 65 umol/L — AB (ref 9–35)

## 2017-01-08 MED ORDER — ACETAZOLAMIDE 250 MG PO TABS
500.0000 mg | ORAL_TABLET | Freq: Two times a day (BID) | ORAL | Status: AC
  Administered 2017-01-08 – 2017-01-10 (×6): 500 mg via ORAL
  Filled 2017-01-08 (×6): qty 2

## 2017-01-08 NOTE — Progress Notes (Signed)
CARDIAC REHAB PHASE I   PRE:  Rate/Rhythm: 74 SR c/ PACs/PVCs  BP:  Sitting: 109/75        SaO2: 92 RA  MODE:  Ambulation: 250 ft   POST:  Rate/Rhythm: 90  BP:  Sitting: 125/74         SaO2: 96 RA  Pt asleep in bed, agreeable to walk. Pt ambulated 250 ft on RA, IV assist x1, slow, steady gait, tolerated fairly well. Pt c/o DOE, fatigue with distance, standing rest x2. Pt to recliner after walk, feet elevated, call bell within reach. Will follow.   [phone] Joylene GrapesEmily C Donnamae Muilenburg, RN, BSN 01/08/2017 10:57 AM

## 2017-01-08 NOTE — Progress Notes (Signed)
Advanced Heart Failure Rounding Note  Primary Cardiologist: Dr. Gala RomneyBensimhon   Subjective:    Admitted from HF clinic 01/02/17 with recurrent marked volume overload and hepatic encephalopathy (ammonia 109). Admit weight 244.   Remains on lasix gtt at 30 and milrinone 0.25 mcg/kg/min.    Today CO-OX 73%. CVP 22 sitting on the side of the bed. Brisk diuresis noted.   SOB with exertion.   Creatinine 1.26  Objective:   Weight Range: 217 lb 11.2 oz (98.7 kg) Body mass index is 35.14 kg/m.   Vital Signs:   Temp:  [98 F (36.7 C)-99 F (37.2 C)] 98.5 F (36.9 C) (02/28 0757) Pulse Rate:  [62-85] 85 (02/28 0757) Resp:  [15-22] 22 (02/28 0818) BP: (94-106)/(53-86) 103/86 (02/28 0757) SpO2:  [92 %-100 %] 97 % (02/28 0757) Weight:  [217 lb 11.2 oz (98.7 kg)] 217 lb 11.2 oz (98.7 kg) (02/28 0601) Last BM Date: 01/08/17 (per pt )  Weight change: Filed Weights   01/06/17 0400 01/07/17 0427 01/08/17 0601  Weight: 228 lb 14.4 oz (103.8 kg) 221 lb 12.8 oz (100.6 kg) 217 lb 11.2 oz (98.7 kg)    Intake/Output:   Intake/Output Summary (Last 24 hours) at 01/08/17 0825 Last data filed at 01/08/17 0700  Gross per 24 hour  Intake           1703.1 ml  Output             4975 ml  Net          -3271.9 ml     Physical Exam: CVP 22 General:  Obese. Sitting on the side edge of bed. NAD.   HEENT: Normal Neck: supple. JVP difficult due to body habitus. Appears elevated to ear. Carotids 2+ bilat; no bruits. No thyromegaly or nodule noted.  Cor: PMI nondisplaced. Irregular. No rubs, gallops. 2/6 TR Lungs: Diminished throughout. Clear.  No wheezes.  Abdomen: soft, NT, mildly distended, no HSM. No bruits or masses. +BS  Extremities: no cyanosis, clubbing, rash. R and LLE 1+ edema  Neuro: alert & orientedx3, cranial nerves grossly intact. moves all 4 extremities w/o difficulty. Affect pleasant. No asterixis. Telemetry: Personally reviewed, NSR 80s with frequent PACs and PVCs.     Labs: CBC No results for input(s): WBC, NEUTROABS, HGB, HCT, MCV, PLT in the last 72 hours. Basic Metabolic Panel  Recent Labs  01/07/17 0524 01/08/17 0440  NA 133* 130*  K 3.6 3.6  CL 89* 88*  CO2 32 34*  GLUCOSE 127* 170*  BUN 46* 49*  CREATININE 1.29* 1.26*  CALCIUM 9.2 9.2   Liver Function Tests No results for input(s): AST, ALT, ALKPHOS, BILITOT, PROT, ALBUMIN in the last 72 hours. No results for input(s): LIPASE, AMYLASE in the last 72 hours. Cardiac Enzymes No results for input(s): CKTOTAL, CKMB, CKMBINDEX, TROPONINI in the last 72 hours.  BNP: BNP (last 3 results)  Recent Labs  11/21/16 1550 12/06/16 1008 01/02/17 1507  BNP 1,037.5* 854.6* 827.6*    ProBNP (last 3 results) No results for input(s): PROBNP in the last 8760 hours.   D-Dimer No results for input(s): DDIMER in the last 72 hours. Hemoglobin A1C No results for input(s): HGBA1C in the last 72 hours. Fasting Lipid Panel No results for input(s): CHOL, HDL, LDLCALC, TRIG, CHOLHDL, LDLDIRECT in the last 72 hours. Thyroid Function Tests No results for input(s): TSH, T4TOTAL, T3FREE, THYROIDAB in the last 72 hours.  Invalid input(s): FREET3  Other results:     Imaging/Studies:  No results found.    Medications:     Scheduled Medications: . aspirin EC  81 mg Oral Daily  . atorvastatin  40 mg Oral Daily  . enoxaparin (LOVENOX) injection  40 mg Subcutaneous Q24H  . gabapentin  300 mg Oral TID  . insulin aspart  0-15 Units Subcutaneous TID WC  . insulin aspart  0-5 Units Subcutaneous QHS  . insulin aspart  4 Units Subcutaneous TID WC  . lactulose  20 g Oral TID  . potassium chloride  40 mEq Oral BID  . sodium chloride flush  10-40 mL Intracatheter Q12H  . sodium chloride flush  3 mL Intravenous Q12H  . spironolactone  25 mg Oral Daily    Infusions: . furosemide (LASIX) infusion 30 mg/hr (01/08/17 0553)  . milrinone 0.25 mcg/kg/min (01/08/17 0157)    PRN  Medications: sodium chloride, acetaminophen, ondansetron (ZOFRAN) IV, sodium chloride flush, sodium chloride flush   Assessment/Plan   1. Acute on chronic systolic CHF due to ICM EF 35-40% 10/2016 - with R>>L heart failure - end-stage 2. CAD s/p NSTEMI and CABG 08/2011 3. Hepatic encephalopathy 4. CKD Stage III 5. Pulmonary HTN    --primarily pulmonary venous HTN  6. DM2 7. OSA 8. Gout 9. Cardiac arrythmia 10. Morbid Obesity 11. Hyponatremia  Volume status improving but CVP still running high . Brisk diuresis noted. Continue lasix gtt at 30 mg/hr and milrinone 0.25 mcg/kg/min. Todays CO-OX is 73%. Bicarb trending up. Add diamox 500 mg twice a day. Renal function stable.   .    Hepatic encephalopathy related to RHF. Ammonia peaked 109>38. Improved with lactulose. Continue lactulose.  Sodium 130 today.  Continue to restrict free water.   Continue CPAP for OSA.  EKG- PACs/PVCs but can't exclude periods of AF. Could consider LINQ  . No change to current plan.   As an outpatient she will need Paramedicine. Also would like to consider Cardiomems as an outpatient. Briefly discussed.     Tonye Becket, NP  01/08/17 8:25 AM   Length of Stay: 6  Advanced Heart Failure Team Pager [phone] (M-F; 7a - 4p)  Please contact [facility] for night-coverage after hours (4p -7a ) and weekends on [email or web address]  Patient seen and examined with Tonye Becket, NP. We discussed all aspects of the encounter. I agree with the assessment and plan as stated above.   Weight down but CVP trending back up despite milrinone support and aggressive diuresis. Renal function stable but bicarb trending up. Agree with adding diamox. Co-ox ok.   Long discussion about need to limit fluid intake in hospital and at home. Serum sodium ok.   Mental status ok. Hepatic encephalopathy resolved with lactulose.   Rhythm stable. NSR with PACs. She is at high risk for AF.   Lorance Pickeral,MD 8:52 AM

## 2017-01-08 NOTE — Plan of Care (Signed)
Problem: Cardiac: Goal: Ability to achieve and maintain adequate cardiopulmonary perfusion will improve Outcome: Progressing Patient diuresing as noted by urinary output, will obtain daily weight later this morning.  No decompensation noted thus far this shift.

## 2017-01-09 LAB — COOXEMETRY PANEL
Carboxyhemoglobin: 2.1 % — ABNORMAL HIGH (ref 0.5–1.5)
Methemoglobin: 0.8 % (ref 0.0–1.5)
O2 SAT: 74.2 %
TOTAL HEMOGLOBIN: 11.6 g/dL — AB (ref 12.0–16.0)

## 2017-01-09 LAB — BASIC METABOLIC PANEL
ANION GAP: 7 (ref 5–15)
BUN: 52 mg/dL — ABNORMAL HIGH (ref 6–20)
CHLORIDE: 89 mmol/L — AB (ref 101–111)
CO2: 36 mmol/L — ABNORMAL HIGH (ref 22–32)
CREATININE: 1.39 mg/dL — AB (ref 0.44–1.00)
Calcium: 9.1 mg/dL (ref 8.9–10.3)
GFR calc non Af Amer: 39 mL/min — ABNORMAL LOW (ref >60)
GFR, EST AFRICAN AMERICAN: 45 mL/min — AB (ref >60)
Glucose, Bld: 192 mg/dL — ABNORMAL HIGH (ref 65–99)
POTASSIUM: 3.3 mmol/L — AB (ref 3.5–5.1)
SODIUM: 132 mmol/L — AB (ref 135–145)

## 2017-01-09 LAB — POTASSIUM: POTASSIUM: 3.1 mmol/L — AB (ref 3.5–5.1)

## 2017-01-09 LAB — GLUCOSE, CAPILLARY
GLUCOSE-CAPILLARY: 191 mg/dL — AB (ref 65–99)
GLUCOSE-CAPILLARY: 136 mg/dL — AB (ref 65–99)
GLUCOSE-CAPILLARY: 122 mg/dL — AB (ref 65–99)
Glucose-Capillary: 194 mg/dL — ABNORMAL HIGH (ref 65–99)

## 2017-01-09 LAB — AMMONIA: Ammonia: 65 umol/L — ABNORMAL HIGH (ref 9–35)

## 2017-01-09 LAB — MAGNESIUM: MAGNESIUM: 2.1 mg/dL (ref 1.7–2.4)

## 2017-01-09 MED ORDER — TORSEMIDE 20 MG PO TABS
100.0000 mg | ORAL_TABLET | Freq: Two times a day (BID) | ORAL | Status: DC
  Administered 2017-01-09 – 2017-01-11 (×5): 100 mg via ORAL
  Filled 2017-01-09 (×5): qty 5

## 2017-01-09 NOTE — Progress Notes (Signed)
Cardiologist on call notified of pt having 8 beats of non-sustained v-tach. VS stable & charted. Pt asymptomatic. Will continue to monitor the pt. Sanda LingerMilam, Jeanee Fabre R, RN   New labs ordered for magnesium & potassium. RN drew labs & sent them to the lab. Sanda LingerMilam, Zakariya Knickerbocker R, RN

## 2017-01-09 NOTE — Progress Notes (Signed)
CARDIAC REHAB PHASE I   PRE:  Rate/Rhythm: 67 SR c/ PVCs  BP:  Sitting: 103/65        SaO2: 99 RA  MODE:  Ambulation: 350 ft   POST:  Rate/Rhythm: 78 SR c/ PVCs  BP:  Sitting: 110/65         SaO2: 94 RA  Pt ambulated 350 ft on RA, IV, assist x1, slow, mostly steady gait, tolerated fairly well. Pt c/o dizziness (this is new since yesterday), fatigue with distance, mild DOE, denies any other complaints, declined rest stop. Briefly reviewed CHF education. Pt verbalized understanding. Pt to recliner after walk, call bell within reach. Will follow.  [phone] Christine GrapesEmily C Geraldene Eisel, RN, BSN 01/09/2017 2:13 PM

## 2017-01-09 NOTE — Plan of Care (Signed)
Problem: Activity: Goal: Risk for activity intolerance will decrease Outcome: Progressing Patient to and from bathroom and bedside commode no shortness of breath noted per RN.

## 2017-01-09 NOTE — Progress Notes (Signed)
Advanced Heart Failure Rounding Note  Primary Cardiologist: Dr. Gala Romney   Subjective:    Admitted from HF clinic 01/02/17 with recurrent marked volume overload and hepatic encephalopathy (ammonia 109). Admit weight 244.   Remains on lasix gtt at 30 and milrinone 0.25 mcg/kg/min.    Today CO-OX 74%. CVP 25 in the bed. Weight down another 4 pounds.    Complaining of fatigue. Able to 250 feet with cardiac rehab.   Creatinine 1.26>1.39  CO2 33>36  Objective:   Weight Range: 217 lb 11.2 oz (98.7 kg) Body mass index is 35.14 kg/m.   Vital Signs:   Temp:  [97.6 F (36.4 C)-99 F (37.2 C)] 97.6 F (36.4 C) (03/01 0503) Pulse Rate:  [65-85] 65 (03/01 0510) Resp:  [14-22] 17 (03/01 0503) BP: (100-111)/(55-86) 100/56 (03/01 0503) SpO2:  [95 %-97 %] 97 % (03/01 0510) Last BM Date: 01/08/17  Weight change: Filed Weights   01/06/17 0400 01/07/17 0427 01/08/17 0601  Weight: 228 lb 14.4 oz (103.8 kg) 221 lb 12.8 oz (100.6 kg) 217 lb 11.2 oz (98.7 kg)    Intake/Output:   Intake/Output Summary (Last 24 hours) at 01/09/17 0706 Last data filed at 01/09/17 0500  Gross per 24 hour  Intake              120 ml  Output             1451 ml  Net            -1331 ml     Physical Exam: CVP 25 General:  Obese. In bed. NAD.   HEENT: Normal Neck: supple. JVP difficult due to body habitus. Remains elevated to ear. Carotids 2+ bilat; no bruits. No thyromegaly or nodule noted.  Cor: PMI nondisplaced. Irregular. No rubs, gallops. 2/6 TR Lungs: Decreased in the bases. On room air.  Abdomen: soft, NT, mildly distended, no HSM. No bruits or masses. +BS  Extremities: no cyanosis, clubbing, rash. R and LLE trace edema.  Neuro: alert & orientedx3, cranial nerves grossly intact. moves all 4 extremities w/o difficulty. Affect pleasant. No asterixis. Telemetry: Personally reviewed, NSR 80s with frequent PACs and PVCs.    Labs: CBC No results for input(s): WBC, NEUTROABS, HGB, HCT, MCV, PLT  in the last 72 hours. Basic Metabolic Panel  Recent Labs  01/08/17 0440 01/09/17 0455  NA 130* 132*  K 3.6 3.3*  CL 88* 89*  CO2 34* 36*  GLUCOSE 170* 192*  BUN 49* 52*  CREATININE 1.26* 1.39*  CALCIUM 9.2 9.1   Liver Function Tests No results for input(s): AST, ALT, ALKPHOS, BILITOT, PROT, ALBUMIN in the last 72 hours. No results for input(s): LIPASE, AMYLASE in the last 72 hours. Cardiac Enzymes No results for input(s): CKTOTAL, CKMB, CKMBINDEX, TROPONINI in the last 72 hours.  BNP: BNP (last 3 results)  Recent Labs  11/21/16 1550 12/06/16 1008 01/02/17 1507  BNP 1,037.5* 854.6* 827.6*    ProBNP (last 3 results) No results for input(s): PROBNP in the last 8760 hours.   D-Dimer No results for input(s): DDIMER in the last 72 hours. Hemoglobin A1C No results for input(s): HGBA1C in the last 72 hours. Fasting Lipid Panel No results for input(s): CHOL, HDL, LDLCALC, TRIG, CHOLHDL, LDLDIRECT in the last 72 hours. Thyroid Function Tests No results for input(s): TSH, T4TOTAL, T3FREE, THYROIDAB in the last 72 hours.  Invalid input(s): FREET3  Other results:     Imaging/Studies:  No results found.    Medications:  Scheduled Medications: . acetaZOLAMIDE  500 mg Oral BID  . aspirin EC  81 mg Oral Daily  . atorvastatin  40 mg Oral Daily  . enoxaparin (LOVENOX) injection  40 mg Subcutaneous Q24H  . gabapentin  300 mg Oral TID  . insulin aspart  0-15 Units Subcutaneous TID WC  . insulin aspart  0-5 Units Subcutaneous QHS  . insulin aspart  4 Units Subcutaneous TID WC  . lactulose  20 g Oral TID  . potassium chloride  40 mEq Oral BID  . sodium chloride flush  10-40 mL Intracatheter Q12H  . sodium chloride flush  3 mL Intravenous Q12H  . spironolactone  25 mg Oral Daily    Infusions: . furosemide (LASIX) infusion 30 mg/hr (01/09/17 0301)  . milrinone 0.25 mcg/kg/min (01/09/17 0339)    PRN Medications: sodium chloride, acetaminophen, ondansetron  (ZOFRAN) IV, sodium chloride flush, sodium chloride flush   Assessment/Plan   1. Acute on chronic systolic CHF due to ICM EF 35-40% 10/2016 - with R>>L heart failure - end-stage 2. CAD s/p NSTEMI and CABG 08/2011 3. Hepatic encephalopathy 4. CKD Stage III 5. Pulmonary HTN    --primarily pulmonary venous HTN  6. DM2 7. OSA 8. Gout 9. Cardiac arrythmia 10. Morbid Obesity 11. Hyponatremia  Today is asking for d/c tomorrow because she has an appointment that she is adamant she cant reschedule. CVP continues to run high and she is not ready for d/c but we will try to get her discharged tomorrow with close follow up in the HF clinic. Suspect that she will have another exacerbation due to biventricular HF.   Cut back milrinone. Stop lasix drip today and start torsemide 100 mg twice a day which is higher than home regimen of torsemide 60 mg twice a day. Continue diamox 500 mg twice a day. Day 2/3. Renal function trending up.    .    Hepatic encephalopathy related to RHF. Ammonia peaked 109>45. Improved with lactulose. Continue lactulose.   Sodium up to 132. Continue to restrict free water.   Continue CPAP for OSA.  EKG- PACs/PVCs but can't exclude periods of AF. Could consider LINQ  . No change to current plan.   As an outpatient she will need Paramedicine. Also would like to consider Cardiomems as an outpatient. Briefly discussed.   Consult Palliative Care for GOC.     Tonye BecketAmy Clegg, NP  01/09/17 7:06 AM   Length of Stay: 7  Advanced Heart Failure Team Pager [phone] (M-F; 7a - 4p)  Please contact [facility] for night-coverage after hours (4p -7a ) and weekends on [email or web address]  Patient seen and examined with Tonye BecketAmy Clegg, NP. We discussed all aspects of the encounter. I agree with the assessment and plan as stated above.   Continues to diurese but remains markedly volume overloaded. Co-ox ok. Creatinine climbing slowly. Ideally would continue IV lasix and milrinone but patient  adamant that she needs to go home tomorrow to sign papers for her house sale. I asked her if there was any way we could get them to bring the papers here as she is not ready for d/c. If she will stay would continue IV lasix and milrinone. Otherwise agree with switch as above.   Discussed with SW team as well. Palliative Care team to see.  Viliami Bracco,MD 1:52 PM

## 2017-01-10 DIAGNOSIS — Z7189 Other specified counseling: Secondary | ICD-10-CM

## 2017-01-10 DIAGNOSIS — Z515 Encounter for palliative care: Secondary | ICD-10-CM

## 2017-01-10 LAB — BASIC METABOLIC PANEL
ANION GAP: 10 (ref 5–15)
BUN: 53 mg/dL — ABNORMAL HIGH (ref 6–20)
CALCIUM: 9.3 mg/dL (ref 8.9–10.3)
CO2: 33 mmol/L — ABNORMAL HIGH (ref 22–32)
Chloride: 88 mmol/L — ABNORMAL LOW (ref 101–111)
Creatinine, Ser: 1.58 mg/dL — ABNORMAL HIGH (ref 0.44–1.00)
GFR calc Af Amer: 39 mL/min — ABNORMAL LOW (ref >60)
GFR, EST NON AFRICAN AMERICAN: 33 mL/min — AB (ref >60)
GLUCOSE: 214 mg/dL — AB (ref 65–99)
Potassium: 3.3 mmol/L — ABNORMAL LOW (ref 3.5–5.1)
Sodium: 131 mmol/L — ABNORMAL LOW (ref 135–145)

## 2017-01-10 LAB — COOXEMETRY PANEL
Carboxyhemoglobin: 2 % — ABNORMAL HIGH (ref 0.5–1.5)
Carboxyhemoglobin: 2 % — ABNORMAL HIGH (ref 0.5–1.5)
METHEMOGLOBIN: 0.7 % (ref 0.0–1.5)
METHEMOGLOBIN: 0.9 % (ref 0.0–1.5)
O2 Saturation: 74.2 %
O2 Saturation: 71.7 %
TOTAL HEMOGLOBIN: 12.2 g/dL (ref 12.0–16.0)
Total hemoglobin: 11.8 g/dL — ABNORMAL LOW (ref 12.0–16.0)

## 2017-01-10 LAB — CBC WITH DIFFERENTIAL/PLATELET
BASOS ABS: 0.1 10*3/uL (ref 0.0–0.1)
BASOS PCT: 1 %
EOS ABS: 0.4 10*3/uL (ref 0.0–0.7)
EOS PCT: 7 %
HEMATOCRIT: 36.6 % (ref 36.0–46.0)
Hemoglobin: 11.7 g/dL — ABNORMAL LOW (ref 12.0–15.0)
Lymphocytes Relative: 19 %
Lymphs Abs: 1 10*3/uL (ref 0.7–4.0)
MCH: 29.3 pg (ref 26.0–34.0)
MCHC: 32 g/dL (ref 30.0–36.0)
MCV: 91.7 fL (ref 78.0–100.0)
MONO ABS: 0.6 10*3/uL (ref 0.1–1.0)
Monocytes Relative: 12 %
NEUTROS ABS: 3.1 10*3/uL (ref 1.7–7.7)
Neutrophils Relative %: 61 %
PLATELETS: 230 10*3/uL (ref 150–400)
RBC: 3.99 MIL/uL (ref 3.87–5.11)
RDW: 16 % — ABNORMAL HIGH (ref 11.5–15.5)
WBC: 5.1 10*3/uL (ref 4.0–10.5)

## 2017-01-10 LAB — GLUCOSE, CAPILLARY
GLUCOSE-CAPILLARY: 196 mg/dL — AB (ref 65–99)
GLUCOSE-CAPILLARY: 176 mg/dL — AB (ref 65–99)
GLUCOSE-CAPILLARY: 182 mg/dL — AB (ref 65–99)
Glucose-Capillary: 182 mg/dL — ABNORMAL HIGH (ref 65–99)

## 2017-01-10 LAB — AMMONIA: AMMONIA: 67 umol/L — AB (ref 9–35)

## 2017-01-10 MED ORDER — DOPAMINE-DEXTROSE 3.2-5 MG/ML-% IV SOLN
1.5000 ug/kg/min | INTRAVENOUS | Status: DC
  Administered 2017-01-10: 2.5 ug/kg/min via INTRAVENOUS
  Administered 2017-01-13: 1.5 ug/kg/min via INTRAVENOUS
  Filled 2017-01-10 (×2): qty 250

## 2017-01-10 MED ORDER — POTASSIUM CHLORIDE CRYS ER 20 MEQ PO TBCR
40.0000 meq | EXTENDED_RELEASE_TABLET | Freq: Once | ORAL | Status: AC
  Administered 2017-01-10: 40 meq via ORAL
  Filled 2017-01-10: qty 2

## 2017-01-10 MED ORDER — MILRINONE LACTATE IN DEXTROSE 20-5 MG/100ML-% IV SOLN
0.2500 ug/kg/min | INTRAVENOUS | Status: DC
  Administered 2017-01-10 – 2017-01-15 (×7): 0.25 ug/kg/min via INTRAVENOUS
  Filled 2017-01-10 (×8): qty 100

## 2017-01-10 NOTE — Progress Notes (Signed)
Advanced Heart Failure Rounding Note  Primary Cardiologist: Dr. Gala RomneyBensimhon   Subjective:    Admitted from HF clinic 01/02/17 with recurrent marked volume overload and hepatic encephalopathy (ammonia 109). Admit weight 244.   Remains on lasix gtt at 30 and milrinone 0.25 mcg/kg/min.    Today CO-OX 74%. CVP 22 sitting on the side of the bed. Weight down another 2 pounds.    Complaining of fatigue. Able to 350 feet with cardiac rehab.   Creatinine 1.26>1.39>1.58  CO2 33>36>  Objective:   Weight Range: 211 lb 4.8 oz (95.8 kg) Body mass index is 34.1 kg/m.   Vital Signs:   Temp:  [97.7 F (36.5 C)-98.6 F (37 C)] 98.2 F (36.8 C) (03/02 0500) Pulse Rate:  [60-76] 71 (03/02 0500) Resp:  [17-21] 21 (03/02 0500) BP: (95-98)/(50-70) 98/65 (03/02 0500) SpO2:  [91 %-99 %] 93 % (03/02 0500) Weight:  [211 lb 4.8 oz (95.8 kg)] 211 lb 4.8 oz (95.8 kg) (03/02 0500) Last BM Date: 01/09/17 (4x per patient)  Weight change: Filed Weights   01/08/17 0601 01/09/17 0713 01/10/17 0500  Weight: 217 lb 11.2 oz (98.7 kg) 213 lb 8 oz (96.8 kg) 211 lb 4.8 oz (95.8 kg)    Intake/Output:   Intake/Output Summary (Last 24 hours) at 01/10/17 0752 Last data filed at 01/10/17 0600  Gross per 24 hour  Intake          1258.51 ml  Output             3600 ml  Net         -2341.49 ml     Physical Exam: CVP 22 General:  Obese. Sitting on the side of the bed.    HEENT: Normal Neck: supple. JVP difficult due to body habitus. Remains elevated to ear. Carotids 2+ bilat; no bruits. No thyromegaly or nodule noted.  Cor: PMI nondisplaced. Irregular. No rubs, gallops. 2/6 TR Lungs: Decreased in the bases. On room air.  Abdomen: soft, NT, mildly distended, no HSM. No bruits or masses. +BS  Extremities: no cyanosis, clubbing, rash. R and LLE 2+ edema.  Neuro: alert & orientedx3, cranial nerves grossly intact. moves all 4 extremities w/o difficulty. Affect pleasant. No asterixis. Telemetry: Personally  reviewed, NSR 80s with frequent PACs and PVCs.    Labs: CBC  Recent Labs  01/10/17 0430  WBC 5.1  NEUTROABS 3.1  HGB 11.7*  HCT 36.6  MCV 91.7  PLT 230   Basic Metabolic Panel  Recent Labs  01/09/17 0455 01/09/17 1730 01/10/17 0430  NA 132*  --  131*  K 3.3* 3.1* 3.3*  CL 89*  --  88*  CO2 36*  --  33*  GLUCOSE 192*  --  214*  BUN 52*  --  53*  CREATININE 1.39*  --  1.58*  CALCIUM 9.1  --  9.3  MG  --  2.1  --    Liver Function Tests No results for input(s): AST, ALT, ALKPHOS, BILITOT, PROT, ALBUMIN in the last 72 hours. No results for input(s): LIPASE, AMYLASE in the last 72 hours. Cardiac Enzymes No results for input(s): CKTOTAL, CKMB, CKMBINDEX, TROPONINI in the last 72 hours.  BNP: BNP (last 3 results)  Recent Labs  11/21/16 1550 12/06/16 1008 01/02/17 1507  BNP 1,037.5* 854.6* 827.6*    ProBNP (last 3 results) No results for input(s): PROBNP in the last 8760 hours.   D-Dimer No results for input(s): DDIMER in the last 72 hours. Hemoglobin A1C No  results for input(s): HGBA1C in the last 72 hours. Fasting Lipid Panel No results for input(s): CHOL, HDL, LDLCALC, TRIG, CHOLHDL, LDLDIRECT in the last 72 hours. Thyroid Function Tests No results for input(s): TSH, T4TOTAL, T3FREE, THYROIDAB in the last 72 hours.  Invalid input(s): FREET3  Other results:     Imaging/Studies:  No results found.    Medications:     Scheduled Medications: . acetaZOLAMIDE  500 mg Oral BID  . aspirin EC  81 mg Oral Daily  . atorvastatin  40 mg Oral Daily  . enoxaparin (LOVENOX) injection  40 mg Subcutaneous Q24H  . gabapentin  300 mg Oral TID  . insulin aspart  0-15 Units Subcutaneous TID WC  . insulin aspart  0-5 Units Subcutaneous QHS  . insulin aspart  4 Units Subcutaneous TID WC  . lactulose  20 g Oral TID  . potassium chloride  40 mEq Oral BID  . potassium chloride  40 mEq Oral Once  . sodium chloride flush  10-40 mL Intracatheter Q12H  . sodium  chloride flush  3 mL Intravenous Q12H  . spironolactone  25 mg Oral Daily  . torsemide  100 mg Oral BID    Infusions:   PRN Medications: sodium chloride, acetaminophen, ondansetron (ZOFRAN) IV, sodium chloride flush, sodium chloride flush   Assessment/Plan   1. Acute on chronic systolic CHF due to ICM EF 35-40% 10/2016 - with R>>L heart failure - end-stage 2. CAD s/p NSTEMI and CABG 08/2011 3. Hepatic encephalopathy 4. CKD Stage III 5. Pulmonary HTN    --primarily pulmonary venous HTN  6. DM2 7. OSA 8. Gout 9. Cardiac arrythmia 10. Morbid Obesity 11. Hyponatremia   CVP remains elevated. 21. Suspect that she will have another exacerbation due to biventricular HF.   Increase milrinone 0.25 mcg. Add 2.5 mcg dopamine. Continue torsemide 100 mg twice a day. Continue diamox 500 mg twice a day. Day 3/3. Renal function trending up. Give extra 40 meq potassium now.  .    Hepatic encephalopathy related to RHF. Ammonia peaked 109>45. Improved with lactulose. Continue lactulose.   Sodium up to 131. Continue to restrict free water.   Continue CPAP for OSA.  EKG- PACs/PVCs. NSVT. Mag 2.1 K 3.3. Supplement potassium .   As an outpatient she will need Paramedicine. Also would like to consider Cardiomems as an outpatient. Briefly discussed.   Consult Palliative Care for GOC.   Tonye Becket, NP  01/10/17 7:52 AM   Length of Stay: 8  Advanced Heart Failure Team Pager [phone] (M-F; 7a - 4p)  Please contact [facility] for night-coverage after hours (4p -7a ) and weekends on [email or web address]  Patient seen and examined with Tonye Becket, NP. We discussed all aspects of the encounter. I agree with the assessment and plan as stated above.   Volume status remains elevated. CVP 22. Creatinine climbing. She wants to go home to sign some papers but she is not able to do this due to her medical condition. Will increase milrinone to 0.25 and start low dose dopamine. Continue po torsemide and diamox.  Supp K+.   Rhythm in NSR with PACs. Watch rhythm with dopamine.  Hepatic encephalopathy  Improved. Continue lactulose.   Bensimhon, Daniel,MD 8:15 AM

## 2017-01-10 NOTE — Care Management Important Message (Signed)
Important Message  Patient Details  Name: Christine Knight MRN: [id number] Date of Birth: Feb 15, 1951   Medicare Important Message Given:  Yes    Raena Pau Abena 01/10/2017, 1:11 PM

## 2017-01-10 NOTE — Consult Note (Signed)
Consultation Note Date: 01/10/2017   Patient Name: Christine Knight  DOB: September 27, 1951  MRN: [id number]  Age / Sex: 66 y.o., female  PCP: Secundino Ginger, PA-C Referring Physician: Jolaine Artist, MD  Reason for Consultation: Establishing goals of care  HPI/Patient Profile: 66 y.o. female  with past medical history of End-stage CHF due to ICM, ED status post STEMI CABG in 2012, hematocrit encephalopathy, see kidney stage III, coronary hypertension, diabetes, obstructive sleep apnea, gout, obesity, hyponatremia admitted on 01/02/2017 with heart failure exacerbation.  Palliative consulted for goals of care   Clinical Assessment and Goals of Care: I met today with Christine Knight.  Her daughter was present for beginning of conversation but left shortly after my arrival. Patient reports that her daughter has been trying to help her take care of some business that she is supposed to have done today. She reports being very distressed by the fact that she is still in the hospital and not able to take care of this herself.   She reports the most important things to her or her family and her independence.  She then became tearful and stated, "I just wanted to take care of my own stuff but I'm stuck here."  We reviewed her clinical course over the past several months including decline she has noted in her functional status. She reports understanding the fact she has a chronic progressive illness that cannot be cured. She reports that she appreciates heart failure team and, "they have been doing everything they can to help me.  Things aren't really getting better though."  She became tearful again and reported that she has been confused and overwhelmed today, and states it is not a good day to keep talking today.  SUMMARY OF RECOMMENDATIONS   - Began discussion with Christine Knight regarding advance care planning.  She reports  that she is "worked up" today as she was supposed to be signing important paperwork but she is not able to take care of this due to being in the hospital. She also reports that she is "really confused" right now and not wanting to further discuss advance care planning at this time. - She is agreeable to follow-up in the future in order to continue conversation.  Will plan for follow-up in a couple of days.  I left my card and she will call if she would like to meet prior to this.  Code Status/Advance Care Planning:  Full code  Psycho-social/Spiritual:   Desire for further Chaplaincy support: Not addressed today  Additional Recommendations: Caregiving  Support/Resources  Prognosis:   Unable to determine due to acuity of illness. I do believe that if her goal were to change to focus on comfort that her prognosis would likely be less than 6 months and she would qualify for hospice support at that time if so desired.  Discharge Planning: To Be Determined      Primary Diagnoses: Present on Admission: . Systolic CHF, acute on chronic (HCC) . Pulmonary hypertension ([place]) .  Acute on chronic systolic ACC/AHA stage C congestive heart failure ([place])   I have reviewed the medical record, interviewed the patient and family, and examined the patient. The following aspects are pertinent.  Past Medical History:  Diagnosis Date  . CAD (coronary artery disease) 08/2011   NSTEMI with subsequent CABG 08/13/2011 with LIMA-LAD, SVG-diagonal, SVG-OM, SVG-PDA  . Carpal tunnel syndrome on both sides    "post OHS in 08/2011; resolved now" (03/17/2013)  . CHF (congestive heart failure) ([place])   . Chronic systolic heart failure (HCC)    Chronic systolic CHF  . Diabetes mellitus type 2 in obese ([place]) 08/10/11  . Dyspnea   . Ejection fraction < 50%    EF 20%, October, 2012  //  EF 25% December, 2012  //   EF 35%, echo, September, 2013  . Hyperlipidemia 08/10/11  . Hypertension   . Ischemic cardiomyopathy  08/13/2011   EF 20% in 08/2011, still 25% 10/21/2011  . Mitral regurgitation    Mild by echo 10/21/11  . Myocardial infarction 08/2011  . Obesity, morbid ([place])   . Orthopnoea    "progressively worse over last 3 wks" (03/17/2013)  . Pleural effusion    Requiring L thoracentesis 09/02/11  . Pulmonary hypertension    Echo, September, 2013, 72 mmHg.   Social History   Social History  . Marital status: Divorced    Spouse name: N/A  . Number of children: 3  . Years of education: N/A   Occupational History  . [employer] teller    Social History Main Topics  . Smoking status: Former Smoker    Types: Cigarettes    Quit date: 11/11/1978  . Smokeless tobacco: Never Used     Comment: 03/17/2013 "only a social smoker when I did smoke; ever bought any"  . Alcohol use Yes     Comment: OCCASIONAL  . Drug use: No  . Sexual activity: Not Currently    Birth control/ protection: Post-menopausal   Other Topics Concern  . None   Social History Narrative   Lives with her sister and daughter.  Ambulates independently.   Family History  Problem Relation Age of Onset  . Heart disease Father   . Hypertension Maternal Grandmother   . Hypertension Sister   . Cancer Neg Hx   . Diabetes Neg Hx   . Kidney disease Neg Hx    Scheduled Meds: . acetaZOLAMIDE  500 mg Oral BID  . aspirin EC  81 mg Oral Daily  . atorvastatin  40 mg Oral Daily  . enoxaparin (LOVENOX) injection  40 mg Subcutaneous Q24H  . gabapentin  300 mg Oral TID  . insulin aspart  0-15 Units Subcutaneous TID WC  . insulin aspart  0-5 Units Subcutaneous QHS  . insulin aspart  4 Units Subcutaneous TID WC  . lactulose  20 g Oral TID  . potassium chloride  40 mEq Oral BID  . sodium chloride flush  10-40 mL Intracatheter Q12H  . sodium chloride flush  3 mL Intravenous Q12H  . spironolactone  25 mg Oral Daily  . torsemide  100 mg Oral BID   Continuous Infusions: . DOPamine 2.5 mcg/kg/min (01/10/17 1230)  . milrinone 0.25 mcg/kg/min  (01/10/17 0800)   PRN Meds:.sodium chloride, acetaminophen, ondansetron (ZOFRAN) IV, sodium chloride flush, sodium chloride flush Medications Prior to Admission:  Prior to Admission medications   Medication Sig Start Date End Date Taking? Authorizing Provider  acetaminophen (TYLENOL) 650 MG CR tablet Take 650 mg by mouth  every 8 (eight) hours as needed (for arthritic pain).    Yes Historical Provider, MD  aspirin 81 MG tablet Take 1 tablet (81 mg total) by mouth daily. 11/15/13  Yes Carlena Bjornstad, MD  atorvastatin (LIPITOR) 40 MG tablet Take 1 tablet (40 mg total) by mouth daily. 11/22/16 02/20/17 Yes Shirley Friar, PA-C  gabapentin (NEURONTIN) 300 MG capsule Take 300 mg by mouth 3 (three) times daily.    Yes Historical Provider, MD  glipiZIDE (GLUCOTROL) 10 MG tablet Take 10 mg by mouth 2 (two) times daily.   Yes Historical Provider, MD  Multiple Vitamins-Minerals (CENTRUM SILVER 50+WOMEN PO) Take 1 tablet by mouth every morning.   Yes Historical Provider, MD  spironolactone (ALDACTONE) 25 MG tablet Take 1 tablet (25 mg total) by mouth daily. 11/06/16  Yes Jolaine Artist, MD  torsemide (DEMADEX) 20 MG tablet Take 3 tablets (60 mg total) by mouth 2 (two) times daily. Patient taking differently: Take 60 mg by mouth 3 (three) times daily.  12/20/16  Yes Jolaine Artist, MD  acetaminophen (TYLENOL) 325 MG tablet Take 2 tablets (650 mg total) by mouth every 4 (four) hours as needed for headache or mild pain. Patient not taking: Reported on 01/02/2017 11/02/16   Isaiah Serge, NP  metolazone (ZAROXOLYN) 5 MG tablet Take 1 tablet (5 mg total) by mouth once. 01/01/17 01/01/17  Shirley Friar, PA-C   Allergies  Allergen Reactions  . Lisinopril Nausea And Vomiting, Other (See Comments) and Cough    Coughing, throwing up, waking up coughing  . Metformin And Related Anaphylaxis   Review of Systems  Constitutional: Positive for activity change, fatigue and unexpected weight change.    Respiratory: Positive for shortness of breath.   Cardiovascular: Positive for leg swelling.  Psychiatric/Behavioral: Positive for confusion. The patient is nervous/anxious.     Physical Exam  General: Alert, awake, in no acute distress.  Heart: irregular.  Murmur appreciated. Lungs: Fair air movement, clear Abdomen: Soft, nontender, nondistended, positive bowel sounds.  Ext: LE edema Skin: Warm and dry Neuro: Grossly intact, nonfocal.  Vital Signs: BP 99/62 (BP Location: Left Arm)   Pulse 75   Temp 98.4 F (36.9 C) (Oral)   Resp (!) 21   Ht _0  (1.676 m)   Wt 95.8 kg (211 lb 4.8 oz)   SpO2 95%   BMI 34.10 kg/m  Pain Assessment: No/denies pain POSS *See Group Information*: 1-Acceptable,Awake and alert Pain Score: 0-No pain   SpO2: SpO2: 95 % O2 Device:SpO2: 95 % O2 Flow Rate: .   IO: Intake/output summary:  Intake/Output Summary (Last 24 hours) at 01/10/17 1246 Last data filed at 01/10/17 2836  Gross per 24 hour  Intake          1278.51 ml  Output             3600 ml  Net         -2321.49 ml    LBM: Last BM Date: 01/09/17 (4x per patient) Baseline Weight: Weight: 108.3 kg (238 lb 12.8 oz) Most recent weight: Weight: 95.8 kg (211 lb 4.8 oz)     Palliative Assessment/Data:   Flowsheet Rows   Flowsheet Row Most Recent Value  Intake Tab  Referral Department  Cardiology  Unit at Time of Referral  Cardiac/Telemetry Unit  Palliative Care Primary Diagnosis  Cardiac  Date Notified  01/09/17  Palliative Care Type  New Palliative care  Reason for referral  Clarify Goals of Care  Date of Admission  01/02/17  Date first seen by Palliative Care  01/10/17  # of days Palliative referral response time  1 Day(s)  # of days IP prior to Palliative referral  7  Clinical Assessment  Palliative Performance Scale Score  40%  Pain Max last 24 hours  0  Pain Min Last 24 hours  0  Psychosocial & Spiritual Assessment  Palliative Care Outcomes  Patient/Family meeting held?   Yes  Who was at the meeting?  Patient  Palliative Care Outcomes  ACP counseling assistance      Time In: 1110 Time Out: 1200 Time Total: 50 Greater than 50%  of this time was spent counseling and coordinating care related to the above assessment and plan.  Signed by: Micheline Rough, MD   Please contact Palliative Medicine Team phone at [phone] for questions and concerns.  For individual provider: See Shea Evans

## 2017-01-10 NOTE — Progress Notes (Signed)
CARDIAC REHAB PHASE I   PRE:  Rate/Rhythm: 82 SR c/ PVCs  BP:  Sitting: 109/63        SaO2: 95 RA  MODE:  Ambulation: 60 ft   POST:  Rate/Rhythm: 93  BP:  Sitting: 106/70         SaO2: 93 RA  Pt still very emotional, tearful at times, agreeable to walk. Pt ambulated 60 ft on RA, IV, hand held assist, gait belt, slow, mildly unsteady gait at times, pt with limited activity tolerance today. Pt c/o of dizziness, states she feels "drunk," requested to sit. Pt able to walk back to room, vital signs stable. Pt to recliner after walk, feet elevated, call bell within reach, chair alarm on. Will follow as x2.     [id number] Joylene GrapesEmily C Ewing Fandino, RN, BSN 01/10/2017 1:30 PM

## 2017-01-11 LAB — GLUCOSE, CAPILLARY
GLUCOSE-CAPILLARY: 180 mg/dL — AB (ref 65–99)
GLUCOSE-CAPILLARY: 134 mg/dL — AB (ref 65–99)
Glucose-Capillary: 146 mg/dL — ABNORMAL HIGH (ref 65–99)
Glucose-Capillary: 172 mg/dL — ABNORMAL HIGH (ref 65–99)

## 2017-01-11 LAB — BASIC METABOLIC PANEL
Anion gap: 14 (ref 5–15)
BUN: 55 mg/dL — AB (ref 6–20)
CALCIUM: 9.1 mg/dL (ref 8.9–10.3)
CO2: 31 mmol/L (ref 22–32)
CREATININE: 1.57 mg/dL — AB (ref 0.44–1.00)
Chloride: 88 mmol/L — ABNORMAL LOW (ref 101–111)
GFR calc non Af Amer: 34 mL/min — ABNORMAL LOW (ref >60)
GFR, EST AFRICAN AMERICAN: 39 mL/min — AB (ref >60)
Glucose, Bld: 194 mg/dL — ABNORMAL HIGH (ref 65–99)
Potassium: 3.4 mmol/L — ABNORMAL LOW (ref 3.5–5.1)
SODIUM: 133 mmol/L — AB (ref 135–145)

## 2017-01-11 LAB — AMMONIA: AMMONIA: 63 umol/L — AB (ref 9–35)

## 2017-01-11 MED ORDER — FUROSEMIDE 10 MG/ML IJ SOLN
15.0000 mg/h | INTRAVENOUS | Status: DC
  Administered 2017-01-11 – 2017-01-13 (×3): 15 mg/h via INTRAVENOUS
  Filled 2017-01-11 (×5): qty 25

## 2017-01-11 MED ORDER — AMIODARONE HCL 200 MG PO TABS
400.0000 mg | ORAL_TABLET | Freq: Two times a day (BID) | ORAL | Status: DC
  Administered 2017-01-11 – 2017-01-14 (×7): 400 mg via ORAL
  Filled 2017-01-11 (×7): qty 2

## 2017-01-11 NOTE — Plan of Care (Signed)
Problem: Safety: Goal: Ability to remain free from injury will improve Outcome: Progressing The patient is using the bsc and calling for help when needing to get up. She also has a chair alarm incase she forgets to call.

## 2017-01-11 NOTE — Progress Notes (Signed)
Patient ID: Elianys Conry, female   DOB: May 03, 1951, 66 y.o.   MRN: [id number]     Advanced Heart Failure Rounding Note  Primary Cardiologist: Dr. Gala Romney   Subjective:    Admitted from HF clinic 01/02/17 with recurrent marked volume overload and hepatic encephalopathy (ammonia 109). Admit weight 244.   Initially was started on Lasix gtt and milrinone 0.25 mcg/kg/min. She was transitioned off Lasix to torsemide due to insistence that she go home.  She later decided to stay in hospital and dopamine gtt at 2.5 was added yesterday.  She did not have much UOP and weight is up 2 lbs.  Creatinine stable at 1.57. Co-ox 68%.  CVP > 20.  She has had short runs NSVT on dopamine.    She is short of breath with exertion.   Creatinine 1.26>1.39>1.58>1.57 CO2 33>36>31  Objective:   Weight Range: 213 lb 8 oz (96.8 kg) Body mass index is 34.46 kg/m.   Vital Signs:   Temp:  [97.9 F (36.6 C)-98.6 F (37 C)] 98.5 F (36.9 C) (03/03 0353) Pulse Rate:  [67-86] 67 (03/03 0353) Resp:  [14-18] 18 (03/03 0353) BP: (99-117)/(54-99) 102/54 (03/03 0353) SpO2:  [91 %-95 %] 94 % (03/03 0353) Weight:  [213 lb 8 oz (96.8 kg)] 213 lb 8 oz (96.8 kg) (03/03 0353) Last BM Date: 01/10/17  Weight change: Filed Weights   01/09/17 0713 01/10/17 0500 01/11/17 0353  Weight: 213 lb 8 oz (96.8 kg) 211 lb 4.8 oz (95.8 kg) 213 lb 8 oz (96.8 kg)    Intake/Output:   Intake/Output Summary (Last 24 hours) at 01/11/17 0945 Last data filed at 01/11/17 0755  Gross per 24 hour  Intake           707.16 ml  Output             1350 ml  Net          -642.84 ml     Physical Exam: CVP >29 General:  Obese. Sitting on the side of the bed.    HEENT: Normal Neck: supple. JVP 16+. Remains elevated to ear. Carotids 2+ bilat; no bruits. No thyromegaly or nodule noted.  Cor: PMI nondisplaced. Irregular. No rubs, gallops. 2/6 TR Lungs: Decreased in the bases. On room air.  Abdomen: soft, NT, mildly distended, no HSM. No  bruits or masses. +BS  Extremities: no cyanosis, clubbing, rash. R and LLE 1+ edema.  Neuro: alert & orientedx3, cranial nerves grossly intact. moves all 4 extremities w/o difficulty. Affect pleasant. No asterixis. Telemetry: Personally reviewed, NSR 80s with frequent PACs and PVCs.    Labs: CBC  Recent Labs  01/10/17 0430  WBC 5.1  NEUTROABS 3.1  HGB 11.7*  HCT 36.6  MCV 91.7  PLT 230   Basic Metabolic Panel  Recent Labs  01/09/17 1730 01/10/17 0430 01/11/17 0501  NA  --  131* 133*  K 3.1* 3.3* 3.4*  CL  --  88* 88*  CO2  --  33* 31  GLUCOSE  --  214* 194*  BUN  --  53* 55*  CREATININE  --  1.58* 1.57*  CALCIUM  --  9.3 9.1  MG 2.1  --   --    Liver Function Tests No results for input(s): AST, ALT, ALKPHOS, BILITOT, PROT, ALBUMIN in the last 72 hours. No results for input(s): LIPASE, AMYLASE in the last 72 hours. Cardiac Enzymes No results for input(s): CKTOTAL, CKMB, CKMBINDEX, TROPONINI in the last 72 hours.  BNP: BNP (  last 3 results)  Recent Labs  11/21/16 1550 12/06/16 1008 01/02/17 1507  BNP 1,037.5* 854.6* 827.6*    ProBNP (last 3 results) No results for input(s): PROBNP in the last 8760 hours.   D-Dimer No results for input(s): DDIMER in the last 72 hours. Hemoglobin A1C No results for input(s): HGBA1C in the last 72 hours. Fasting Lipid Panel No results for input(s): CHOL, HDL, LDLCALC, TRIG, CHOLHDL, LDLDIRECT in the last 72 hours. Thyroid Function Tests No results for input(s): TSH, T4TOTAL, T3FREE, THYROIDAB in the last 72 hours.  Invalid input(s): FREET3  Other results:     Imaging/Studies:  No results found.    Medications:     Scheduled Medications: . amiodarone  400 mg Oral BID  . aspirin EC  81 mg Oral Daily  . atorvastatin  40 mg Oral Daily  . enoxaparin (LOVENOX) injection  40 mg Subcutaneous Q24H  . gabapentin  300 mg Oral TID  . insulin aspart  0-15 Units Subcutaneous TID WC  . insulin aspart  0-5 Units  Subcutaneous QHS  . insulin aspart  4 Units Subcutaneous TID WC  . lactulose  20 g Oral TID  . potassium chloride  40 mEq Oral BID  . sodium chloride flush  10-40 mL Intracatheter Q12H  . sodium chloride flush  3 mL Intravenous Q12H  . spironolactone  25 mg Oral Daily    Infusions: . DOPamine 2.5 mcg/kg/min (01/10/17 1230)  . furosemide (LASIX) infusion    . milrinone 0.25 mcg/kg/min (01/10/17 2303)    PRN Medications: sodium chloride, acetaminophen, ondansetron (ZOFRAN) IV, sodium chloride flush, sodium chloride flush   Assessment/Plan   1. Acute on chronic systolic CHF due to ICM EF 35-40% 10/2016 - with R>>L heart failure - end-stage 2. CAD s/p NSTEMI and CABG 08/2011 3. Hepatic encephalopathy 4. CKD Stage III 5. Pulmonary HTN    --primarily pulmonary venous HTN  6. DM2 7. OSA 8. Gout 9. Cardiac arrythmia 10. Morbid Obesity 11. Hyponatremia  CVP remains elevated >20, poor response to po torsemide.  Marked volume overloaded.  Remains on milrinone + dopamine. Co-ox 68%.  - With ventricular ectopy, will decrease dopamine to 1.5 mcg/kg/min.  - Continue milrinone.  - Stop torsemide (got dose this morning) and start Lasix gtt at 15 mg/hr.  - I suspect prognosis here is poor with volume overload that appears intractable.   Short runs NSVT on dopamine, will decrease dose and add amiodarone po while still on dopamine.  Would like to give dopamine at least 1 more day with transition to Lasix gtt to see if it helps.      Hepatic encephalopathy related to RHF. Ammonia peaked 109>45>63. Improved with lactulose. Continue lactulose.   Sodium up to 133. Continue to restrict free water.   Continue CPAP for OSA.  Seen by palliative care, remains full code for now.   Delylah Stanczyk,MD 9:45 AM  01/11/2017

## 2017-01-11 NOTE — Progress Notes (Signed)
Notified MD Mclean while on 3 west about runs of VT longest 26 beats. I will continue to monitor the patient closely.   Sheppard Evensina Carmilla Granville RN

## 2017-01-12 LAB — GLUCOSE, CAPILLARY
GLUCOSE-CAPILLARY: 205 mg/dL — AB (ref 65–99)
GLUCOSE-CAPILLARY: 166 mg/dL — AB (ref 65–99)
Glucose-Capillary: 208 mg/dL — ABNORMAL HIGH (ref 65–99)
Glucose-Capillary: 163 mg/dL — ABNORMAL HIGH (ref 65–99)

## 2017-01-12 LAB — CBC
HCT: 35.8 % — ABNORMAL LOW (ref 36.0–46.0)
HEMOGLOBIN: 11.6 g/dL — AB (ref 12.0–15.0)
MCH: 29.4 pg (ref 26.0–34.0)
MCHC: 32.4 g/dL (ref 30.0–36.0)
MCV: 90.6 fL (ref 78.0–100.0)
Platelets: 228 10*3/uL (ref 150–400)
RBC: 3.95 MIL/uL (ref 3.87–5.11)
RDW: 15.6 % — ABNORMAL HIGH (ref 11.5–15.5)
WBC: 5 10*3/uL (ref 4.0–10.5)

## 2017-01-12 LAB — BASIC METABOLIC PANEL
ANION GAP: 13 (ref 5–15)
BUN: 60 mg/dL — ABNORMAL HIGH (ref 6–20)
CHLORIDE: 88 mmol/L — AB (ref 101–111)
CO2: 30 mmol/L (ref 22–32)
Calcium: 8.9 mg/dL (ref 8.9–10.3)
Creatinine, Ser: 1.67 mg/dL — ABNORMAL HIGH (ref 0.44–1.00)
GFR calc Af Amer: 36 mL/min — ABNORMAL LOW (ref >60)
GFR calc non Af Amer: 31 mL/min — ABNORMAL LOW (ref >60)
Glucose, Bld: 167 mg/dL — ABNORMAL HIGH (ref 65–99)
Potassium: 3.5 mmol/L (ref 3.5–5.1)
Sodium: 131 mmol/L — ABNORMAL LOW (ref 135–145)

## 2017-01-12 LAB — COOXEMETRY PANEL
Carboxyhemoglobin: 1.6 % — ABNORMAL HIGH (ref 0.5–1.5)
Methemoglobin: 1 % (ref 0.0–1.5)
O2 Saturation: 71.7 %
Total hemoglobin: 11.5 g/dL — ABNORMAL LOW (ref 12.0–16.0)

## 2017-01-12 LAB — AMMONIA: Ammonia: 87 umol/L — ABNORMAL HIGH (ref 9–35)

## 2017-01-12 MED ORDER — POTASSIUM CHLORIDE CRYS ER 20 MEQ PO TBCR
60.0000 meq | EXTENDED_RELEASE_TABLET | Freq: Two times a day (BID) | ORAL | Status: DC
  Administered 2017-01-12 – 2017-01-17 (×11): 60 meq via ORAL
  Filled 2017-01-12 (×11): qty 3

## 2017-01-12 MED ORDER — METOLAZONE 5 MG PO TABS
2.5000 mg | ORAL_TABLET | Freq: Two times a day (BID) | ORAL | Status: DC
  Administered 2017-01-12 – 2017-01-13 (×3): 2.5 mg via ORAL
  Filled 2017-01-12 (×3): qty 1

## 2017-01-12 MED ORDER — ALTEPLASE 2 MG IJ SOLR
2.0000 mg | Freq: Once | INTRAMUSCULAR | Status: AC
  Administered 2017-01-12: 2 mg

## 2017-01-12 MED ORDER — LACTULOSE 10 GM/15ML PO SOLN
30.0000 g | Freq: Three times a day (TID) | ORAL | Status: DC
  Administered 2017-01-12 – 2017-01-14 (×5): 30 g via ORAL
  Filled 2017-01-12 (×7): qty 45

## 2017-01-12 NOTE — Progress Notes (Signed)
Patient refuses the use of CPAP 

## 2017-01-12 NOTE — Progress Notes (Signed)
Pt refused HS dose of lactulose. Pt educated on why it is ordered and importance of taking-pt states she" just cant take it tonight, I will be up all night and Im still going from the last one". Will continue to monitor. Dierdre HighmanHall, Teagyn Fishel Marie, RN

## 2017-01-12 NOTE — Progress Notes (Signed)
Patient ID: Christine Knight, female   DOB: 1951-07-20, 66 y.o.   MRN: [id number] Patient ID: Christine Knight, female   DOB: Apr 09, 1951, 66 y.o.   MRN: [id number]     Advanced Heart Failure Rounding Note  Primary Cardiologist: Dr. Gala Romney   Subjective:    Admitted from HF clinic 01/02/17 with recurrent marked volume overload and hepatic encephalopathy (ammonia 109). Admit weight 244.   Initially was started on Lasix gtt and milrinone 0.25 mcg/kg/min. She was transitioned off Lasix to torsemide due to insistence that she go home.  She later decided to stay in hospital and dopamine gtt at 2.5 was added 3/2.  Yesterday, dopamine decreased to 1.5 with NSVT and po amiodarone added to control rhythm.   - Still with ventricular ectopy, but no more than 3 beats NSVT.  Continue current dopamine and amiodarone.  - Co-ox 71.7% with CVP 19-20 this morning.  She did not diurese very well yesterday with Lasix gtt.  Creatinine 1.57 => 1.67.  Will continue milrinone, dopamine at current rate, and Lasix gtt today.  Add metolazone 2.5 mg bid.   She is short of breath with exertion.   NH3 higher today at 87.  Somewhat lethargic but appropriate responses.   Creatinine 1.26>1.39>1.58>1.57>1.67   CO2 33>36>31>30  Objective:   Weight Range: 212 lb 8 oz (96.4 kg) Body mass index is 34.3 kg/m.   Vital Signs:   Temp:  [97.8 F (36.6 C)-98.5 F (36.9 C)] 98.3 F (36.8 C) (03/04 0736) Pulse Rate:  [50-78] 50 (03/04 0736) Resp:  [14-19] 14 (03/04 0736) BP: (88-111)/(53-82) 88/53 (03/04 0736) SpO2:  [95 %-98 %] 97 % (03/04 0736) Weight:  [212 lb 8 oz (96.4 kg)] 212 lb 8 oz (96.4 kg) (03/04 0500) Last BM Date: 01/11/17  Weight change: Filed Weights   01/10/17 0500 01/11/17 0353 01/12/17 0500  Weight: 211 lb 4.8 oz (95.8 kg) 213 lb 8 oz (96.8 kg) 212 lb 8 oz (96.4 kg)    Intake/Output:   Intake/Output Summary (Last 24 hours) at 01/12/17 0855 Last data filed at 01/12/17 0400  Gross per 24 hour    Intake           575.44 ml  Output             1050 ml  Net          -474.56 ml     Physical Exam: CVP 19-20 General:  Obese. Sitting on the side of the bed.    HEENT: Normal Neck: supple. JVP 16+.  Carotids 2+ bilat; no bruits. No thyromegaly or nodule noted.  Cor: PMI nondisplaced. Irregular. No rubs, gallops. 2/6 TR Lungs: Decreased in the bases. On room air.  Abdomen: soft, NT, mildly distended, no HSM. No bruits or masses. +BS  Extremities: no cyanosis, clubbing, rash. 1+ ankle edema bilaterally.  Neuro: alert & orientedx3, cranial nerves grossly intact. moves all 4 extremities w/o difficulty. Affect pleasant. No asterixis. Telemetry: Personally reviewed, NSR 80s with frequent PACs and PVCs.    Labs: CBC  Recent Labs  01/10/17 0430 01/12/17 0455  WBC 5.1 5.0  NEUTROABS 3.1  --   HGB 11.7* 11.6*  HCT 36.6 35.8*  MCV 91.7 90.6  PLT 230 228   Basic Metabolic Panel  Recent Labs  01/09/17 1730  01/11/17 0501 01/12/17 0455  NA  --   < > 133* 131*  K 3.1*  < > 3.4* 3.5  CL  --   < > 88* 88*  CO2  --   < >  31 30  GLUCOSE  --   < > 194* 167*  BUN  --   < > 55* 60*  CREATININE  --   < > 1.57* 1.67*  CALCIUM  --   < > 9.1 8.9  MG 2.1  --   --   --   < > = values in this interval not displayed. Liver Function Tests No results for input(s): AST, ALT, ALKPHOS, BILITOT, PROT, ALBUMIN in the last 72 hours. No results for input(s): LIPASE, AMYLASE in the last 72 hours. Cardiac Enzymes No results for input(s): CKTOTAL, CKMB, CKMBINDEX, TROPONINI in the last 72 hours.  BNP: BNP (last 3 results)  Recent Labs  11/21/16 1550 12/06/16 1008 01/02/17 1507  BNP 1,037.5* 854.6* 827.6*    ProBNP (last 3 results) No results for input(s): PROBNP in the last 8760 hours.   D-Dimer No results for input(s): DDIMER in the last 72 hours. Hemoglobin A1C No results for input(s): HGBA1C in the last 72 hours. Fasting Lipid Panel No results for input(s): CHOL, HDL, LDLCALC,  TRIG, CHOLHDL, LDLDIRECT in the last 72 hours. Thyroid Function Tests No results for input(s): TSH, T4TOTAL, T3FREE, THYROIDAB in the last 72 hours.  Invalid input(s): FREET3  Other results:     Imaging/Studies:  No results found.    Medications:     Scheduled Medications: . amiodarone  400 mg Oral BID  . aspirin EC  81 mg Oral Daily  . atorvastatin  40 mg Oral Daily  . enoxaparin (LOVENOX) injection  40 mg Subcutaneous Q24H  . gabapentin  300 mg Oral TID  . insulin aspart  0-15 Units Subcutaneous TID WC  . insulin aspart  0-5 Units Subcutaneous QHS  . insulin aspart  4 Units Subcutaneous TID WC  . lactulose  30 g Oral TID  . metolazone  2.5 mg Oral BID  . potassium chloride  60 mEq Oral BID  . sodium chloride flush  10-40 mL Intracatheter Q12H  . sodium chloride flush  3 mL Intravenous Q12H  . spironolactone  25 mg Oral Daily    Infusions: . DOPamine 1.5 mcg/kg/min (01/11/17 0948)  . furosemide (LASIX) infusion 15 mg/hr (01/12/17 0840)  . milrinone 0.25 mcg/kg/min (01/12/17 0210)    PRN Medications: sodium chloride, acetaminophen, ondansetron (ZOFRAN) IV, sodium chloride flush, sodium chloride flush   Assessment/Plan   1. Acute on chronic systolic CHF due to ICM EF 35-40% 10/2016 - with R>>L heart failure - end-stage 2. CAD s/p NSTEMI and CABG 08/2011 3. Hepatic encephalopathy 4. CKD Stage III 5. Pulmonary HTN    --primarily pulmonary venous HTN  6. DM2 7. OSA 8. Gout 9. Cardiac arrythmia 10. Morbid Obesity 11. Hyponatremia  CVP remains elevated 19-20, still not great diuretic response to current diuretic regimen and creatinine up a bit. Remains on milrinone + dopamine 1.5. Co-ox 71%.  - Continue current dopamine at 1.5, ventricular ectopy has stabilized on amiodarone.  - Continue milrinone.  - Continue Lasix gtt at 15 mg/hr, add metolazone 2.5 mg bid.  - I suspect prognosis here is poor with volume overload that appears intractable. I think we need  to continue to work with palliative care service as I am not sure we have a lot of good options to improve her long-term outcome.  Would like palliative care service to come by again tomorrow.     Hepatic encephalopathy related to RHF. Ammonia 109>45>63>87. Mild lethargy this morning but answers questions appropriately.  Increase lactulose to 30 g tid.  Continue CPAP for OSA.  Rhythm difficult to determine (probably sinus) => will get ECG.   Seen by palliative care, remains full code for now. Needs ongoing discussions.   Shresta Risden,MD 8:55 AM  01/12/2017

## 2017-01-13 LAB — COOXEMETRY PANEL
CARBOXYHEMOGLOBIN: 1.4 % (ref 0.5–1.5)
Carboxyhemoglobin: 1.7 % — ABNORMAL HIGH (ref 0.5–1.5)
METHEMOGLOBIN: 1 % (ref 0.0–1.5)
Methemoglobin: 1.1 % (ref 0.0–1.5)
O2 SAT: 65.5 %
O2 SAT: 58.5 %
TOTAL HEMOGLOBIN: 12.5 g/dL (ref 12.0–16.0)
Total hemoglobin: 11.5 g/dL — ABNORMAL LOW (ref 12.0–16.0)

## 2017-01-13 LAB — CBC
HCT: 35 % — ABNORMAL LOW (ref 36.0–46.0)
Hemoglobin: 11.5 g/dL — ABNORMAL LOW (ref 12.0–15.0)
MCH: 29.9 pg (ref 26.0–34.0)
MCHC: 32.9 g/dL (ref 30.0–36.0)
MCV: 90.9 fL (ref 78.0–100.0)
PLATELETS: 239 10*3/uL (ref 150–400)
RBC: 3.85 MIL/uL — AB (ref 3.87–5.11)
RDW: 16 % — AB (ref 11.5–15.5)
WBC: 4.5 10*3/uL (ref 4.0–10.5)

## 2017-01-13 LAB — GLUCOSE, CAPILLARY
GLUCOSE-CAPILLARY: 220 mg/dL — AB (ref 65–99)
GLUCOSE-CAPILLARY: 218 mg/dL — AB (ref 65–99)
GLUCOSE-CAPILLARY: 154 mg/dL — AB (ref 65–99)
GLUCOSE-CAPILLARY: 177 mg/dL — AB (ref 65–99)

## 2017-01-13 LAB — AMMONIA: AMMONIA: 88 umol/L — AB (ref 9–35)

## 2017-01-13 LAB — BASIC METABOLIC PANEL
Anion gap: 7 (ref 5–15)
BUN: 62 mg/dL — AB (ref 6–20)
CALCIUM: 8.7 mg/dL — AB (ref 8.9–10.3)
CO2: 29 mmol/L (ref 22–32)
CREATININE: 1.89 mg/dL — AB (ref 0.44–1.00)
Chloride: 95 mmol/L — ABNORMAL LOW (ref 101–111)
GFR, EST AFRICAN AMERICAN: 31 mL/min — AB (ref >60)
GFR, EST NON AFRICAN AMERICAN: 27 mL/min — AB (ref >60)
Glucose, Bld: 178 mg/dL — ABNORMAL HIGH (ref 65–99)
Potassium: 3.9 mmol/L (ref 3.5–5.1)
SODIUM: 131 mmol/L — AB (ref 135–145)

## 2017-01-13 MED ORDER — METOLAZONE 5 MG PO TABS
5.0000 mg | ORAL_TABLET | Freq: Two times a day (BID) | ORAL | Status: DC
  Administered 2017-01-13 – 2017-01-14 (×2): 5 mg via ORAL
  Filled 2017-01-13 (×2): qty 1

## 2017-01-13 MED ORDER — FUROSEMIDE 10 MG/ML IJ SOLN
30.0000 mg/h | INTRAVENOUS | Status: DC
  Administered 2017-01-13 – 2017-01-14 (×2): 30 mg/h via INTRAVENOUS
  Filled 2017-01-13 (×6): qty 25

## 2017-01-13 NOTE — Progress Notes (Signed)
CARDIAC REHAB PHASE I   PRE:  Rate/Rhythm: 67 SR c/ PVCs  BP:  Sitting: 105/64        SaO2: 97 RA  MODE:  Ambulation: 350 ft   POST:  Rate/Rhythm: 94 SR c/ frequent PVCs/bigeminy  BP:  Sitting: 96/56         SaO2: 94 RA  Pt ambulated 350 ft on RA, IV, assist x2, slow, steady gait, tolerated fairly well. Pt c/o fatigue with distance, states she feels "a little winded", denies any other complaints, declined rest stop, appreciative of walk. Pt to recliner after walk, feet elevated, call bell within reach. Can be x1.   [phone] Joylene GrapesEmily C Nicolas Sisler, RN, BSN 01/13/2017 9:52 AM

## 2017-01-13 NOTE — Progress Notes (Signed)
01/13/2017- Pt has refused cpap for the last 2 nights.

## 2017-01-13 NOTE — Progress Notes (Addendum)
Patient ID: Christine Knight, female   DOB: 11-02-1951, 66 y.o.   MRN: [id number]     Advanced Heart Failure Rounding Note  Primary Cardiologist: Dr. Gala RomneyBensimhon   Subjective:    Admitted from HF clinic 01/02/17 with recurrent marked volume overload and hepatic encephalopathy (ammonia 109). Admit weight 244.   Initially, was started on Lasix gtt and milrinone 0.25 mcg/kg/min. She was transitioned off Lasix to torsemide due to insistence that she go home.  She later decided to stay in hospital and dopamine gtt at 2.5 was added 3/2.  Dopamine decreased to 1.5 with NSVT and po amiodarone added to control rhythm.    - Co-ox 58.5% down today with CVP 17-18 this morning.  She did not diurese very well yesterday with Lasix gtt.   Metolazone added yesterday. Weight up one pound from yesterday, she was +23596ml yesterday.   Creatinine 1.26>1.39>1.58>1.57>1.67>1.89   CO2 33>36>31>30>29  She says she is breathing better, still with orthopnea and bendopnea.  Objective:   Weight Range: 213 lb 6.4 oz (96.8 kg) Body mass index is 34.44 kg/m.   Vital Signs:   Temp:  [97.5 F (36.4 C)-98.4 F (36.9 C)] 97.8 F (36.6 C) (03/05 0427) Pulse Rate:  [50-75] 58 (03/05 0427) Resp:  [14-18] 16 (03/05 0427) BP: (88-113)/(53-70) 104/68 (03/05 0427) SpO2:  [93 %-100 %] 98 % (03/05 0453) Weight:  [213 lb 6.4 oz (96.8 kg)] 213 lb 6.4 oz (96.8 kg) (03/05 0453) Last BM Date: 01/12/17 (pt on lactulose)  Weight change: Filed Weights   01/11/17 0353 01/12/17 0500 01/13/17 0453  Weight: 213 lb 8 oz (96.8 kg) 212 lb 8 oz (96.4 kg) 213 lb 6.4 oz (96.8 kg)    Intake/Output:   Intake/Output Summary (Last 24 hours) at 01/13/17 0713 Last data filed at 01/13/17 0500  Gross per 24 hour  Intake           1886.2 ml  Output             1590 ml  Net            296.2 ml     Physical Exam: CVP 17-18 General:  NAD, sitting in chair, alert.   HEENT: Normal Neck: supple. JVP to jaw.  Carotids 2+ bilat; no bruits. No  thyromegaly or nodule noted.  Cor: PMI nondisplaced. Irregular. No rubs, gallops. 2/6 TR Lungs:Diminished crackles in bases, CTA in bilateral upper lobes  Abdomen: soft, NT, mildly distended, no HSM. No bruits or masses. +BS  Extremities: no cyanosis, clubbing, rash. 1+ ankle edema bilaterally, 1+ right pretibial edema Neuro: alert & orientedx3, cranial nerves grossly intact. moves all 4 extremities w/o difficulty. Affect pleasant. No asterixis.  Telemetry: Personally reviewed, NSR 80s with frequent  PVCs.    Labs: CBC  Recent Labs  01/12/17 0455 01/13/17 0430  WBC 5.0 4.5  HGB 11.6* 11.5*  HCT 35.8* 35.0*  MCV 90.6 90.9  PLT 228 239   Basic Metabolic Panel  Recent Labs  01/12/17 0455 01/13/17 0430  NA 131* 131*  K 3.5 3.9  CL 88* 95*  CO2 30 29  GLUCOSE 167* 178*  BUN 60* 62*  CREATININE 1.67* 1.89*  CALCIUM 8.9 8.7*    BNP: BNP (last 3 results)  Recent Labs  11/21/16 1550 12/06/16 1008 01/02/17 1507  BNP 1,037.5* 854.6* 827.6*      Medications:     Scheduled Medications: . amiodarone  400 mg Oral BID  . aspirin EC  81 mg Oral Daily  .  atorvastatin  40 mg Oral Daily  . enoxaparin (LOVENOX) injection  40 mg Subcutaneous Q24H  . gabapentin  300 mg Oral TID  . insulin aspart  0-15 Units Subcutaneous TID WC  . insulin aspart  0-5 Units Subcutaneous QHS  . insulin aspart  4 Units Subcutaneous TID WC  . lactulose  30 g Oral TID  . metolazone  2.5 mg Oral BID  . potassium chloride  60 mEq Oral BID  . sodium chloride flush  10-40 mL Intracatheter Q12H  . sodium chloride flush  3 mL Intravenous Q12H  . spironolactone  25 mg Oral Daily    Infusions: . DOPamine 1.5 mcg/kg/min (01/11/17 0948)  . furosemide (LASIX) infusion 15 mg/hr (01/13/17 0657)  . milrinone 0.25 mcg/kg/min (01/12/17 0210)    PRN Medications: sodium chloride, acetaminophen, ondansetron (ZOFRAN) IV, sodium chloride flush, sodium chloride flush   Assessment/Plan   1. Acute on  chronic systolic CHF due to ICM EF 35-40% 10/2016 - with R>>L heart failure - end-stage 2. CAD s/p NSTEMI and CABG 08/2011 3. Hepatic encephalopathy 4. CKD Stage III 5. Pulmonary HTN    --primarily pulmonary venous HTN  6. DM2 7. OSA 8. Gout 9. Cardiac arrythmia 10. Morbid Obesity 11. Hyponatremia  CVP remains elevated 17-18, Co ox reduced this am to 58.5. Would not increase dopa with ectopy and frequent PVC's, although improved with po Amio, still has very frequent PVC's.MD to advise on increasing Milrinone to 0.375. Continue current dopamine at 1.5, lasix gtt, and metolazone.   Hepatic encephalopathy related to RHF. Ammonia 109>45>63>87>88. Alert, no lethargy today, answers questions appropriately.   Continue CPAP for OSA.   Seen by palliative care on 01/10/17, will make sure they see her again today, he prognosis remains poor.   Little Ishikawa, NP  7:13 AM  01/13/2017  Patient seen with NP, agree with the above note.  Poor diuresis despite aggressive diuretic regimen.  She feels bad, short of breath.  Co-ox 65%.  She remains volume overloaded.  Creatinine mildly higher.  - I will increase Lasix to 30 mg/hr today with metolazone 5 mg bid.  - If we do not see a response to this, I think we have reached the maximum of what we will be able to do in the hospital.  She has end stage R>L heart failure.    She is mentally clear on lactulose.   She had a discussion with palliative care today, does not want to be DNR or have hospice care.  Says her family would not want this.   Marca Ancona 01/13/2017 1:15 PM

## 2017-01-13 NOTE — Progress Notes (Signed)
Palliative Care Progress Note  Reason for visit: Establishing goals of care in light of advanced heart failure.  I met today with Ms. Demattia.  She was sitting in bedside chair and reports that she is doing "Worse.  I just feel worse."  She reports the most important things to her are her children (she has a son and 2 daughters) and her faith.  Feels her doctors have been doing a good job explaining things to her and she knows that she has advanced heart failure that cannot be fixed.  She reports that she is not afraid of dying feels that God will take her whenever it is His time.  I asked if she has thought about her wishes at the end of her life, and she reports that she has talked with her children (particularly her son) about this.  While she is not afraid of dying, she reports that her children are adamantly opposed to her pursuing anything other than full aggressive care.  We discussed heroic measures at the end-of-life and that this is not a likely pathway towards her ever getting well enough again to be out of the hospital if she were to suffer from cardiac or respiratory arrest.  She reports understanding this, but stated again that her children have a specifically told her they do not want her to agree to DO NOT ATTEMPT RESUSCITATE status.  She then relayed that if God has decided it is her time, it will not matter what man does to try and save her.  We discussed the unintended consequence of increased suffering for her as well as emotional suffering for family that may occur with aggressive measures that do not serve a realistic chance of adding quality time to her life.  She feels that knowing that every attempt was made to lengthen her life "even if I am at a point where I can't even flutter my eyelashes" will bring comfort to her family and this is what she wants to do.  We also discussed surrogate decision making. She reports she has had most conversations with her son, however, she wants  all of her children to be involved in making any decisions that she cannot make for herself. We talked about healthcare power of attorney and surrogate decision-making.  She does not feel a need to complete healthcare power of attorney at this time.  If she would like to complete any paperwork to establish this, she will call and let her team know and we will be in touch with chaplain services who can help facilitate this.  Ms. Pavon is clear and her desire to continue with any and all life prolonging measures including attempts at resuscitation in the event of cardiac or respiratory arrest. Her reason for this is because she feels that this would be how to best protect her children as she progresses in this disease and reaches the end of her life. She is agreeable to continue conversation regarding this, however, it does not appear to me based on our conversation today that this is something she is likely to change her mind about.  I suggested that we have another meeting with her children present in order to discuss the reality of her situation and likely pathways moving forward. She declined this and stated she does not find it necessary to continue any conversations with her family regarding end-of-life.  I am transitioning off service, however, will ask for someone from palliative medicine team to visit with patient later this  week to continue conversation.  Please let me know if there are specific areas in which we may be of assistance in the care of Ms. Kaatz.  Total time: 45 minutes Greater than 50%  of this time was spent counseling and coordinating care related to the above assessment and plan.  Micheline Rough, MD [facility] [phone]

## 2017-01-14 LAB — MAGNESIUM: Magnesium: 2.6 mg/dL — ABNORMAL HIGH (ref 1.7–2.4)

## 2017-01-14 LAB — BASIC METABOLIC PANEL
ANION GAP: 9 (ref 5–15)
BUN: 65 mg/dL — ABNORMAL HIGH (ref 6–20)
CALCIUM: 9 mg/dL (ref 8.9–10.3)
CHLORIDE: 93 mmol/L — AB (ref 101–111)
CO2: 27 mmol/L (ref 22–32)
Creatinine, Ser: 1.96 mg/dL — ABNORMAL HIGH (ref 0.44–1.00)
GFR calc Af Amer: 30 mL/min — ABNORMAL LOW (ref >60)
GFR calc non Af Amer: 26 mL/min — ABNORMAL LOW (ref >60)
GLUCOSE: 170 mg/dL — AB (ref 65–99)
Potassium: 3.9 mmol/L (ref 3.5–5.1)
Sodium: 129 mmol/L — ABNORMAL LOW (ref 135–145)

## 2017-01-14 LAB — COOXEMETRY PANEL
CARBOXYHEMOGLOBIN: 1.8 % — AB (ref 0.5–1.5)
METHEMOGLOBIN: 0.8 % (ref 0.0–1.5)
O2 Saturation: 70.5 %
TOTAL HEMOGLOBIN: 12.1 g/dL (ref 12.0–16.0)

## 2017-01-14 LAB — AMMONIA: Ammonia: 65 umol/L — ABNORMAL HIGH (ref 9–35)

## 2017-01-14 LAB — CBC
HEMATOCRIT: 35.5 % — AB (ref 36.0–46.0)
HEMOGLOBIN: 11.4 g/dL — AB (ref 12.0–15.0)
MCH: 28.8 pg (ref 26.0–34.0)
MCHC: 32.1 g/dL (ref 30.0–36.0)
MCV: 89.6 fL (ref 78.0–100.0)
Platelets: 253 10*3/uL (ref 150–400)
RBC: 3.96 MIL/uL (ref 3.87–5.11)
RDW: 15.5 % (ref 11.5–15.5)
WBC: 4.3 10*3/uL (ref 4.0–10.5)

## 2017-01-14 LAB — GLUCOSE, CAPILLARY
GLUCOSE-CAPILLARY: 103 mg/dL — AB (ref 65–99)
Glucose-Capillary: 203 mg/dL — ABNORMAL HIGH (ref 65–99)
Glucose-Capillary: 235 mg/dL — ABNORMAL HIGH (ref 65–99)
Glucose-Capillary: 182 mg/dL — ABNORMAL HIGH (ref 65–99)

## 2017-01-14 MED ORDER — LACTULOSE 10 GM/15ML PO SOLN
30.0000 g | Freq: Two times a day (BID) | ORAL | Status: DC
  Administered 2017-01-15 – 2017-01-16 (×2): 30 g via ORAL
  Filled 2017-01-14 (×4): qty 45

## 2017-01-14 MED ORDER — TORSEMIDE 20 MG PO TABS: 100.0000 mg | ORAL_TABLET | Freq: Two times a day (BID) | ORAL | Status: DC

## 2017-01-14 MED ORDER — TORSEMIDE 20 MG PO TABS
80.0000 mg | ORAL_TABLET | Freq: Two times a day (BID) | ORAL | Status: DC
  Administered 2017-01-14 – 2017-01-17 (×6): 80 mg via ORAL
  Filled 2017-01-14 (×6): qty 4

## 2017-01-14 MED ORDER — DOPAMINE-DEXTROSE 3.2-5 MG/ML-% IV SOLN: 1.5000 ug/kg/min | INTRAVENOUS | Status: DC

## 2017-01-14 MED ORDER — AMIODARONE HCL 200 MG PO TABS
200.0000 mg | ORAL_TABLET | Freq: Two times a day (BID) | ORAL | Status: DC
  Administered 2017-01-14: 200 mg via ORAL
  Filled 2017-01-14: qty 1

## 2017-01-14 NOTE — Care Management Important Message (Signed)
Important Message  Patient Details  Name: Christine BabeBarbara Ildefonso MRN: [id number] Date of Birth: May 09, 1951   Medicare Important Message Given:  Yes    Shayma Pfefferle Abena 01/14/2017, 11:15 AM

## 2017-01-14 NOTE — Evaluation (Signed)
Physical Therapy Evaluation Patient Details Name: Christine Knight MRN: [id number] DOB: [birth year] Today's Date: 01/14/2017   History of Present Illness  Christine Knight is a 66 y.o. female w/ PMHx significant for morbid obesity, systolic HF due to ICM (10/12 EF 25%; 07/2012 EF 35%; 03/18/13 EF 20-25%), CAD with h/o NSTEMI s/p CABG Oct 2012, pulmonary hypertension (PA peak 72mmHg), and poorly controlled DM 2.. Pt admitted for fluid overload  Clinical Impression  Pt functioning near baseline. Discussed energy conservation techniques. Pt reports she can have 24/7 assist if she needs it. Acute PT to monitor patient and progress activity tolerance.    Follow Up Recommendations No PT follow up;Supervision - Intermittent    Equipment Recommendations  None recommended by PT    Recommendations for Other Services       Precautions / Restrictions Precautions Precautions: Fall Precaution Comments: mild SOB with activity Restrictions Weight Bearing Restrictions: No      Mobility  Bed Mobility               General bed mobility comments: pt up in chair upon PT arrival  Transfers Overall transfer level: Needs assistance Equipment used: None Transfers: Sit to/from Stand Sit to Stand: Supervision         General transfer comment: pt used hands properly and had no difficulties  Ambulation/Gait Ambulation/Gait assistance: Min guard Ambulation Distance (Feet): 250 Feet Assistive device: None Gait Pattern/deviations: Step-through pattern;WFL(Within Functional Limits) Gait velocity: mildly decreased   General Gait Details: occasional double step backwards when turning but able to maintain balance without assist  Stairs            Wheelchair Mobility    Modified Rankin (Stroke Patients Only)       Balance Overall balance assessment: No apparent balance deficits (not formally assessed)                                           Pertinent Vitals/Pain  Pain Assessment: No/denies pain    Home Living Family/patient expects to be discharged to:: Private residence Living Arrangements: Alone Available Help at Discharge: Family;Friend(s);Available 24 hours/day Type of Home: House Home Access: Level entry     Home Layout: One level Home Equipment: Cane - single point;Grab bars - tub/shower Additional Comments: Pt just purchased her own house last friday. Pt was living with sister in an apartment. pt reports daughter to stay with her however daughter works 4 nights a week. reports other daughter and son can help as well    Prior Function Level of Independence: Independent         Comments: drives     Hand Dominance   Dominant Hand: Right    Extremity/Trunk Assessment   Upper Extremity Assessment Upper Extremity Assessment: Generalized weakness    Lower Extremity Assessment Lower Extremity Assessment: Generalized weakness    Cervical / Trunk Assessment Cervical / Trunk Assessment: Normal  Communication   Communication: No difficulties  Cognition Arousal/Alertness: Awake/alert Behavior During Therapy: WFL for tasks assessed/performed Overall Cognitive Status: Within Functional Limits for tasks assessed                      General Comments      Exercises     Assessment/Plan    PT Assessment Patient needs continued PT services  PT Problem List Decreased strength;Decreased activity tolerance;Decreased balance;Decreased mobility;Cardiopulmonary status limiting activity  PT Treatment Interventions DME instruction;Gait training;Functional mobility training;Therapeutic activities;Therapeutic exercise;Balance training    PT Goals (Current goals can be found in the Care Plan section)  Acute Rehab PT Goals Patient Stated Goal: home asap PT Goal Formulation: With patient Time For Goal Achievement: 01/21/17 Potential to Achieve Goals: Good Additional Goals Additional Goal #1: Pt to score >19 on DGI  indicating minimal falls risk.    Frequency Min 2X/week   Barriers to discharge        Co-evaluation               End of Session   Activity Tolerance: Patient tolerated treatment well Patient left: in chair;with call bell/phone within reach Nurse Communication: Mobility status PT Visit Diagnosis: Muscle weakness (generalized) (M62.81)         Time: 1610-9604 PT Time Calculation (min) (ACUTE ONLY): 13 min   Charges:   PT Evaluation $PT Eval Low Complexity: 1 Procedure     PT G Codes:         Honour Schwieger M Lovene Maret 01/14/2017, 1:35 PM  Lewis Shock, PT, DPT Pager #: [phone] Office #: [phone]

## 2017-01-14 NOTE — Progress Notes (Signed)
Patient ID: Christine Knight, female   DOB: 1951/07/22, 66 y.o.   MRN: [id number]     Advanced Heart Failure Rounding Note  Primary Cardiologist: Dr. Gala Knight   Subjective:    Admitted from HF clinic 01/02/17 with recurrent marked volume overload and hepatic encephalopathy (ammonia 109). Admit weight 244.   Initially, was started on Lasix gtt and milrinone 0.25 mcg/kg/min. She was transitioned off Lasix to torsemide due to insistence that she go home.  She later decided to stay in hospital and dopamine gtt at 2.5 was added 3/2.  Dopamine decreased to 1.5 with NSVT and po amiodarone added to control rhythm.    Co-ox 70.5  today with CVP 13-14 this morning.  Yesterday, Lasix gtt was increased to 30mg /hr. Weight up a pound from yesterday. Weight down 30 pounds overall.   Creatinine 1.26>1.39>1.58>1.57>1.67>1.89>1.96   CO2 33>36>31>30>29>27  She is alert and in chair this morning.  Walked yesterday, says she did "ok."   Objective:   Weight Range: 214 lb 9.6 oz (97.3 kg) Body mass index is 34.64 kg/m.   Vital Signs:   Temp:  [97.7 F (36.5 C)-99.1 F (37.3 C)] 98 F (36.7 C) (03/06 0358) Pulse Rate:  [53-65] 60 (03/06 0358) Resp:  [11-18] 15 (03/06 0358) BP: (90-105)/(45-72) 102/70 (03/06 0358) SpO2:  [98 %-100 %] 99 % (03/06 0358) Weight:  [214 lb 9.6 oz (97.3 kg)] 214 lb 9.6 oz (97.3 kg) (03/06 0358) Last BM Date: 01/13/17 (Pt on lactulose, 3 BM today)  Weight change: Filed Weights   01/12/17 0500 01/13/17 0453 01/14/17 0358  Weight: 212 lb 8 oz (96.4 kg) 213 lb 6.4 oz (96.8 kg) 214 lb 9.6 oz (97.3 kg)    Intake/Output:   Intake/Output Summary (Last 24 hours) at 01/14/17 0701 Last data filed at 01/14/17 0600  Gross per 24 hour  Intake          1886.25 ml  Output              852 ml  Net          1034.25 ml     Physical Exam: CVP 13-14 General:  NAD, sitting in chair, says she feels better today.   HEENT: Normal Neck: supple. JVP to jaw.  Carotids 2+ bilat; no  bruits. No thyromegaly or nodule noted.  Cor: PMI nondisplaced. Irregularly irregular. No rubs, gallops. 2/6 TR Lungs: Diminished in bases, upper lobes diminished as well. Abdomen: soft, NT, mildly distended, no HSM. No bruits or masses. +BS  Extremities: no cyanosis, clubbing, rash. 2+ ankle edema bilaterally, 1+ right pretibial edema Neuro: alert & orientedx3, cranial nerves grossly intact. moves all 4 extremities w/o difficulty. Affect pleasant. No asterixis.  Telemetry: Personally reviewed, NSR 80s with episodes bigeminy and trigeminy.   Labs: CBC  Recent Labs  01/13/17 0430 01/14/17 0445  WBC 4.5 4.3  HGB 11.5* 11.4*  HCT 35.0* 35.5*  MCV 90.9 89.6  PLT 239 253   Basic Metabolic Panel  Recent Labs  01/13/17 0430 01/14/17 0445  NA 131* 129*  K 3.9 3.9  CL 95* 93*  CO2 29 27  GLUCOSE 178* 170*  BUN 62* 65*  CREATININE 1.89* 1.96*  CALCIUM 8.7* 9.0  MG  --  2.6*    BNP: BNP (last 3 results)  Recent Labs  11/21/16 1550 12/06/16 1008 01/02/17 1507  BNP 1,037.5* 854.6* 827.6*      Medications:     Scheduled Medications: . amiodarone  400 mg Oral BID  .  aspirin EC  81 mg Oral Daily  . atorvastatin  40 mg Oral Daily  . enoxaparin (LOVENOX) injection  40 mg Subcutaneous Q24H  . gabapentin  300 mg Oral TID  . insulin aspart  0-15 Units Subcutaneous TID WC  . insulin aspart  0-5 Units Subcutaneous QHS  . insulin aspart  4 Units Subcutaneous TID WC  . lactulose  30 g Oral TID  . metolazone  5 mg Oral BID  . potassium chloride  60 mEq Oral BID  . sodium chloride flush  10-40 mL Intracatheter Q12H  . sodium chloride flush  3 mL Intravenous Q12H  . spironolactone  25 mg Oral Daily    Infusions: . DOPamine 1.5 mcg/kg/min (01/13/17 2110)  . furosemide (LASIX) infusion 30 mg/hr (01/13/17 2012)  . milrinone 0.25 mcg/kg/min (01/14/17 0019)    PRN Medications: sodium chloride, acetaminophen, ondansetron (ZOFRAN) IV, sodium chloride flush, sodium chloride  flush   Assessment/Plan   1. Acute on chronic systolic CHF due to ICM EF 35-40% 10/2016 - with R>>L heart failure - end-stage 2. CAD s/p NSTEMI and CABG 08/2011 3. Hepatic encephalopathy 4. CKD Stage III 5. Pulmonary HTN    --primarily pulmonary venous HTN  6. DM2 7. OSA 8. Gout 9. Cardiac arrythmia 10. Morbid Obesity 11. Hyponatremia  Co ox 70.5% this am, CVP improved at 13-14, diuresis has slowed. Lasix gtt increased to 30mg /hr yesterday, dopa at 1.5, metolazone at 5mg  BID. Creatinine has increased today, 1.89>>1.96. Sodium down to 129, was given Tolvaptan for Na of 119. Continue current regimen today.  Hepatic encephalopathy related to RHF. Ammonia improved today, now 65. She said she had frequent bowel movements last night.   Has refused CPAP for the last 2 nights.   Appreciate Palliative Care, patient would like to remain a full code at the wishes of her children.   Christine IshikawaErin E Smith, NP  7:01 AM  01/14/2017   Advanced Heart Failure Team  Pager [phone] M-F 7am-4pm.  Please contact [facility] for night-coverage after hours (4p -7a ) and weekends on [email or web address]  Patient seen with NP, agree with the above note.  Still with frequent ectopy since starting dopamine.  Co-ox 71%, CVP down to 13-14 but still with poor urine output.  Creatinine marginally higher.   She is walking the halls and generally feels ok currently.    I am not sure that we are going to be able to make much further progress in terms of diuresis.  With creatinine rising and still poor diuresis, I am going to try to back off support.  Palliative care following. Family having hard time coming to terms with end stage CHF.  Patient talked with nurse and then with me today.  She wants to be DNR.  She does not want hospice care at this time due to family concerns.   - Will make her DNR.  - Stop dopamine today, continue current milrinone.  Followup co-ox in morning.  Probably will need to continue IV milrinone at  home for palliation.   - Stop IV Lasix and metolazone.  Will start torsemide 80 mg bid.    She is having multiple BMs daily on lactulose, she is clear mentally today.  I am going to decrease lactulose to bid for patient comfort.   Christine Knight Harwick 01/14/2017 9:21 AM

## 2017-01-14 NOTE — Progress Notes (Signed)
CARDIAC REHAB PHASE I   PRE:  Rate/Rhythm:62  SR c/ PVCs  BP:  Sitting: 112/63        SaO2: 97 RA  MODE:  Ambulation: 250 ft   POST:  Rate/Rhythm: 79 bigeminy  BP:  Sitting: 103/68         SaO2: 97 RA  Pt assisted to bathroom prior to ambulation. Pt ambulated 250 ft on RA, IV, hand held assist, steady gait, tolerated fairly well. Pt c/o "feeling winded" about halfway through walk, denies any other complaints, standing rest x1. Pt to recliner after walk, call bell within reach. Will follow.   [phone] Joylene GrapesEmily C Ludia Gartland, RN, BSN 01/14/2017 2:15 PM

## 2017-01-15 LAB — COOXEMETRY PANEL
Carboxyhemoglobin: 1.3 % (ref 0.5–1.5)
METHEMOGLOBIN: 0.9 % (ref 0.0–1.5)
O2 SAT: 64.6 %
TOTAL HEMOGLOBIN: 11.4 g/dL — AB (ref 12.0–16.0)

## 2017-01-15 LAB — CBC
HCT: 35.2 % — ABNORMAL LOW (ref 36.0–46.0)
Hemoglobin: 11.7 g/dL — ABNORMAL LOW (ref 12.0–15.0)
MCH: 29.7 pg (ref 26.0–34.0)
MCHC: 33.2 g/dL (ref 30.0–36.0)
MCV: 89.3 fL (ref 78.0–100.0)
Platelets: 226 10*3/uL (ref 150–400)
RBC: 3.94 MIL/uL (ref 3.87–5.11)
RDW: 15.8 % — AB (ref 11.5–15.5)
WBC: 4.3 10*3/uL (ref 4.0–10.5)

## 2017-01-15 LAB — BASIC METABOLIC PANEL
Anion gap: 9 (ref 5–15)
BUN: 67 mg/dL — AB (ref 6–20)
CO2: 26 mmol/L (ref 22–32)
CREATININE: 2.01 mg/dL — AB (ref 0.44–1.00)
Calcium: 9 mg/dL (ref 8.9–10.3)
Chloride: 92 mmol/L — ABNORMAL LOW (ref 101–111)
GFR calc Af Amer: 29 mL/min — ABNORMAL LOW (ref >60)
GFR, EST NON AFRICAN AMERICAN: 25 mL/min — AB (ref >60)
GLUCOSE: 170 mg/dL — AB (ref 65–99)
POTASSIUM: 4.3 mmol/L (ref 3.5–5.1)
Sodium: 127 mmol/L — ABNORMAL LOW (ref 135–145)

## 2017-01-15 LAB — GLUCOSE, CAPILLARY
GLUCOSE-CAPILLARY: 197 mg/dL — AB (ref 65–99)
GLUCOSE-CAPILLARY: 201 mg/dL — AB (ref 65–99)
Glucose-Capillary: 125 mg/dL — ABNORMAL HIGH (ref 65–99)
Glucose-Capillary: 115 mg/dL — ABNORMAL HIGH (ref 65–99)

## 2017-01-15 LAB — AMMONIA: AMMONIA: 82 umol/L — AB (ref 9–35)

## 2017-01-15 NOTE — Progress Notes (Signed)
Patient ID: Christine Knight, female   DOB: [birth year], 66 y.o.   MRN: [id number]     Advanced Heart Failure Rounding Note  Primary Cardiologist: Dr. Gala RomneyBensimhon   Subjective:    Admitted from HF clinic 01/02/17 with recurrent marked volume overload and hepatic encephalopathy (ammonia 109). Admit weight 244.   Initially, was started on Lasix gtt and milrinone 0.25 mcg/kg/min. She was transitioned off Lasix to torsemide due to insistence that she go home.  She later decided to stay in hospital and dopamine gtt at 2.5 was added 3/2.  Dopamine decreased to 1.5 with NSVT and po amiodarone added to control rhythm.  Dopa, IV Lasix and metolazone stopped on 3/6. Started po torsemide 80mg  BID. Dr.Tanayah Squitieri spoke with the patient and her family, she wishes to be a DNR.     Ammonia level increased today to 82, was 65 yesterday. She is alert and has just walked to the bathroom. Denies SOB with activity. Her main compliant today is abdominal discomfort and frequent bowel movements.   Objective:   Weight Range: 214 lb 12.8 oz (97.4 kg) Body mass index is 34.67 kg/m.   Vital Signs:   Temp:  [97.6 F (36.4 C)-98.4 F (36.9 C)] 97.6 F (36.4 C) (03/07 0435) Pulse Rate:  [45-61] 45 (03/07 0435) Resp:  [16-25] 25 (03/07 0435) BP: (98-127)/(56-68) 127/66 (03/07 0435) SpO2:  [97 %-100 %] 99 % (03/07 0435) Weight:  [214 lb 12.8 oz (97.4 kg)] 214 lb 12.8 oz (97.4 kg) (03/07 0435) Last BM Date: 01/14/17  Weight change: Filed Weights   01/13/17 0453 01/14/17 0358 01/15/17 0435  Weight: 213 lb 6.4 oz (96.8 kg) 214 lb 9.6 oz (97.3 kg) 214 lb 12.8 oz (97.4 kg)    Intake/Output:   Intake/Output Summary (Last 24 hours) at 01/15/17 0711 Last data filed at 01/15/17 0600  Gross per 24 hour  Intake            757.8 ml  Output             2725 ml  Net          -1967.2 ml     Physical Exam: CVP 16 General:  NAD, sitting in chair, Having stomach cramping.  HEENT: Normal Neck: supple. JVP to jaw.  Carotids  2+ bilat; no bruits. No thyromegaly or nodule noted.  Cor: PMI nondisplaced. Irregularly irregular. No rubs, gallops. 2/6 TR Lungs: CTA upper lobes, bilateral crackles in lower lobes.  Abdomen: soft, NT, mildly distended, no HSM. No bruits or masses. +BS  Extremities: no cyanosis, clubbing, rash. 2+ ankle edema bilaterally, 1+ right pretibial edema Neuro: alert & orientedx3, cranial nerves grossly intact. moves all 4 extremities w/o difficulty. Affect pleasant. No asterixis.  Telemetry: Personally reviewed, NSR 80s with frequent PVC's.   Labs: CBC  Recent Labs  01/14/17 0445 01/15/17 0425  WBC 4.3 4.3  HGB 11.4* 11.7*  HCT 35.5* 35.2*  MCV 89.6 89.3  PLT 253 226   Basic Metabolic Panel  Recent Labs  01/14/17 0445 01/15/17 0425  NA 129* 127*  K 3.9 4.3  CL 93* 92*  CO2 27 26  GLUCOSE 170* 170*  BUN 65* 67*  CREATININE 1.96* 2.01*  CALCIUM 9.0 9.0  MG 2.6*  --     BNP: BNP (last 3 results)  Recent Labs  11/21/16 1550 12/06/16 1008 01/02/17 1507  BNP 1,037.5* 854.6* 827.6*      Medications:     Scheduled Medications: . amiodarone  200 mg Oral BID  .  aspirin EC  81 mg Oral Daily  . atorvastatin  40 mg Oral Daily  . enoxaparin (LOVENOX) injection  40 mg Subcutaneous Q24H  . gabapentin  300 mg Oral TID  . insulin aspart  0-15 Units Subcutaneous TID WC  . insulin aspart  0-5 Units Subcutaneous QHS  . insulin aspart  4 Units Subcutaneous TID WC  . lactulose  30 g Oral BID  . potassium chloride  60 mEq Oral BID  . sodium chloride flush  10-40 mL Intracatheter Q12H  . sodium chloride flush  3 mL Intravenous Q12H  . spironolactone  25 mg Oral Daily  . torsemide  80 mg Oral BID    Infusions: . milrinone 0.25 mcg/kg/min (01/15/17 0432)    PRN Medications: sodium chloride, acetaminophen, ondansetron (ZOFRAN) IV, sodium chloride flush, sodium chloride flush   Assessment/Plan   1. Acute on chronic systolic CHF due to ICM EF 35-40% 10/2016 - with R>>L  heart failure - end-stage 2. CAD s/p NSTEMI and CABG 08/2011 3. Hepatic encephalopathy 4. CKD Stage III 5. Pulmonary HTN    --primarily pulmonary venous HTN  6. DM2 7. OSA 8. Gout 9. Cardiac arrythmia 10. Morbid Obesity 11. Hyponatremia  Co ox 64.6% this am. Creatinine 1.89->1.96->2.01 today. Dopa, IV diuresis and metolazone stopped yesterday. She remains on milrinone.  Never had low output documented.  Will stop milrinone today and repeat co-ox in am. Continue 80 mg torsemide BID. Lactulose decreased yesterday for patient comfort, ammonia level higher today but she is not symptomatic. She did walk to the bathroom without SOB. At this point, she just wishes to go home. Will discuss with MD.  Little Ishikawa, NP  7:11 AM  01/15/2017   Advanced Heart Failure Team  Pager [phone] M-F 7am-4pm.  Please contact [facility] for night-coverage after hours (4p -7a ) and weekends on [email or web address]  Patient seen with NP, agree with the above note.  Co-ox 65%, CVP 16 with good UOP yesterday.  Creatinine marginally higher.   She is walking the halls and generally feels ok currently, wants to go home.     I am not sure that we are going to be able to make much further progress in terms of diuresis.  With creatinine rising and still poor diuresis, I am going to try to back off support.  Palliative care following. Family having hard time coming to terms with end stage CHF.  Patient wants to be DNR.  She does not want hospice care at this time due to family concerns.   - Stop milrinone today.  She does not want to go home with this and would not qualify through Medicare as low output has not been documented. - Continue torsemide 80 mg bid.  - Can stop amiodarone after stopping milrinone.     NH3 higher but mentally very clear.  Continue current lactulose.   Home tomorrow if creatinine and co-ox stable off milrinone.  She will, unfortunately, be high risk for readmission.  I think that  hospice/palliative care would be ideal at home but for now will not be able to do this.   Marca Ancona 01/15/2017 8:39 AM

## 2017-01-15 NOTE — Progress Notes (Signed)
CARDIAC REHAB PHASE I   PRE:  Rate/Rhythm: 64 SR freq PVCs  BP:  Supine:   Sitting: 102/68  Standing:    SaO2: 97%RA  MODE:  Ambulation: 300 ft   POST:  Rate/Rhythm: 79 SR PVCs  BP:  Supine:   Sitting: 94/60  Standing: \   SaO2: 97%RA 1355-1432 . Pt walked 300 ft on RA with minimal asst and tolerated well. Pt wanted to walk. Stated she has been having several BMS today. To recliner with call bell and phone. Denied dizziness. BP lower after walk. Straightened up room after assisting pt form rest room prior to walk. Christine [name] [name], RN BSN  01/15/2017 2:29 PM

## 2017-01-16 ENCOUNTER — Telehealth: Payer: Self-pay | Admitting: *Deleted

## 2017-01-16 DIAGNOSIS — N179 Acute kidney failure, unspecified: Secondary | ICD-10-CM

## 2017-01-16 DIAGNOSIS — I50810 Right heart failure, unspecified: Secondary | ICD-10-CM

## 2017-01-16 DIAGNOSIS — I5033 Acute on chronic diastolic (congestive) heart failure: Secondary | ICD-10-CM

## 2017-01-16 LAB — COOXEMETRY PANEL
Carboxyhemoglobin: 1.2 % (ref 0.5–1.5)
Carboxyhemoglobin: 1.5 % (ref 0.5–1.5)
Methemoglobin: 0.9 % (ref 0.0–1.5)
Methemoglobin: 0.9 % (ref 0.0–1.5)
O2 SAT: 50.3 %
O2 Saturation: 97.3 %
TOTAL HEMOGLOBIN: 13.1 g/dL (ref 12.0–16.0)
Total hemoglobin: 11.7 g/dL — ABNORMAL LOW (ref 12.0–16.0)

## 2017-01-16 LAB — BASIC METABOLIC PANEL
ANION GAP: 10 (ref 5–15)
BUN: 67 mg/dL — AB (ref 6–20)
CO2: 26 mmol/L (ref 22–32)
Calcium: 8.7 mg/dL — ABNORMAL LOW (ref 8.9–10.3)
Chloride: 94 mmol/L — ABNORMAL LOW (ref 101–111)
Creatinine, Ser: 2 mg/dL — ABNORMAL HIGH (ref 0.44–1.00)
GFR calc Af Amer: 29 mL/min — ABNORMAL LOW (ref >60)
GFR, EST NON AFRICAN AMERICAN: 25 mL/min — AB (ref >60)
Glucose, Bld: 140 mg/dL — ABNORMAL HIGH (ref 65–99)
POTASSIUM: 3.8 mmol/L (ref 3.5–5.1)
SODIUM: 130 mmol/L — AB (ref 135–145)

## 2017-01-16 LAB — CBC
HEMATOCRIT: 37 % (ref 36.0–46.0)
HEMOGLOBIN: 12.4 g/dL (ref 12.0–15.0)
MCH: 29.7 pg (ref 26.0–34.0)
MCHC: 33.5 g/dL (ref 30.0–36.0)
MCV: 88.7 fL (ref 78.0–100.0)
Platelets: 263 10*3/uL (ref 150–400)
RBC: 4.17 MIL/uL (ref 3.87–5.11)
RDW: 15.8 % — AB (ref 11.5–15.5)
WBC: 4.6 10*3/uL (ref 4.0–10.5)

## 2017-01-16 LAB — GLUCOSE, CAPILLARY
GLUCOSE-CAPILLARY: 96 mg/dL (ref 65–99)
Glucose-Capillary: 154 mg/dL — ABNORMAL HIGH (ref 65–99)
Glucose-Capillary: 131 mg/dL — ABNORMAL HIGH (ref 65–99)
Glucose-Capillary: 161 mg/dL — ABNORMAL HIGH (ref 65–99)

## 2017-01-16 LAB — AMMONIA: AMMONIA: 93 umol/L — AB (ref 9–35)

## 2017-01-16 NOTE — Telephone Encounter (Signed)
Called and left a message on vm  to call back 01/16/17

## 2017-01-16 NOTE — Progress Notes (Signed)
Pt. Refused cpap. 

## 2017-01-16 NOTE — Progress Notes (Signed)
Patient ID: Christine Knight, female   DOB: Jun 18, 1951, 66 y.o.   MRN: [id number]     Advanced Heart Failure Rounding Note  Primary Cardiologist: Dr. Gala RomneyBensimhon   Subjective:    Admitted from HF clinic 01/02/17 with recurrent marked volume overload and hepatic encephalopathy (ammonia 109). Admit weight 244.   Initially, was started on Lasix gtt and milrinone 0.25 mcg/kg/min. She was transitioned off Lasix to torsemide due to insistence that she go home.  She later decided to stay in hospital and dopamine gtt at 2.5 was added 3/2.  Dopamine decreased to 1.5 with NSVT and po amiodarone added to control rhythm.  Dopa, IV Lasix and metolazone stopped on 3/6. Started po torsemide 80mg  BID. Dr.McLean spoke with the patient and her family, she wishes to be a DNR, but declines Hospice services.   Yesterday, milrinone was discontinued, patient does not qualify for home milrinone. CVP is 17-19 this am, Co ox falsely elevated. She feels tired today, wants to go home. No SOB at rest. Has walked with cardiac rehab and did well. Says her swelling is better. Does not want to take lactulose.   Objective:   Weight Range: 212 lb 1.6 oz (96.2 kg) Body mass index is 34.23 kg/m.   Vital Signs:   Temp:  [97.3 F (36.3 C)-98.8 F (37.1 C)] 97.3 F (36.3 C) (03/08 0300) Pulse Rate:  [57-59] 57 (03/08 0300) Resp:  [16-21] 16 (03/08 0739) BP: (92-106)/(63-68) 92/68 (03/08 0300) SpO2:  [100 %] 100 % (03/08 0300) Weight:  [212 lb 1.6 oz (96.2 kg)] 212 lb 1.6 oz (96.2 kg) (03/08 0300) Last BM Date: 01/16/17 (per pt)  Weight change: Filed Weights   01/14/17 0358 01/15/17 0435 01/16/17 0300  Weight: 214 lb 9.6 oz (97.3 kg) 214 lb 12.8 oz (97.4 kg) 212 lb 1.6 oz (96.2 kg)    Intake/Output:   Intake/Output Summary (Last 24 hours) at 01/16/17 0754 Last data filed at 01/16/17 0330  Gross per 24 hour  Intake              600 ml  Output             1200 ml  Net             -600 ml     Physical Exam: CVP  19 General:  NAD, sitting in chair. Has walked this morning to and from the bathroom.  HEENT: Normal Neck: supple. JVP to jaw.  Carotids 2+ bilat; no bruits. No thyromegaly or nodule noted.  Cor: PMI nondisplaced. Irregularly irregular. No rubs, gallops. 2/6 TR Lungs: clear  Abdomen: soft, NT, mildly distended, no HSM. No bruits or masses. +BS  Extremities: no cyanosis, clubbing, rash. 1+ edema Neuro: alert & orientedx3, cranial nerves grossly intact. moves all 4 extremities w/o difficulty. Affect pleasant. No asterixis.  Telemetry: Personally reviewed, Ventricular bigeminy rates in the 70's.   Labs: CBC  Recent Labs  01/15/17 0425 01/16/17 0500  WBC 4.3 4.6  HGB 11.7* 12.4  HCT 35.2* 37.0  MCV 89.3 88.7  PLT 226 263   Basic Metabolic Panel  Recent Labs  01/14/17 0445 01/15/17 0425 01/16/17 0500  NA 129* 127* 130*  K 3.9 4.3 3.8  CL 93* 92* 94*  CO2 27 26 26   GLUCOSE 170* 170* 140*  BUN 65* 67* 67*  CREATININE 1.96* 2.01* 2.00*  CALCIUM 9.0 9.0 8.7*  MG 2.6*  --   --     BNP: BNP (last 3 results)  Recent  Labs  11/21/16 1550 12/06/16 1008 01/02/17 1507  BNP 1,037.5* 854.6* 827.6*      Medications:     Scheduled Medications: . aspirin EC  81 mg Oral Daily  . atorvastatin  40 mg Oral Daily  . enoxaparin (LOVENOX) injection  40 mg Subcutaneous Q24H  . gabapentin  300 mg Oral TID  . insulin aspart  0-15 Units Subcutaneous TID WC  . insulin aspart  0-5 Units Subcutaneous QHS  . insulin aspart  4 Units Subcutaneous TID WC  . lactulose  30 g Oral BID  . potassium chloride  60 mEq Oral BID  . sodium chloride flush  10-40 mL Intracatheter Q12H  . sodium chloride flush  3 mL Intravenous Q12H  . spironolactone  25 mg Oral Daily  . torsemide  80 mg Oral BID    Infusions:   PRN Medications: sodium chloride, acetaminophen, ondansetron (ZOFRAN) IV, sodium chloride flush, sodium chloride flush   Assessment/Plan   1. Acute on chronic systolic CHF due  to ICM EF 35-40% 10/2016 - with R>>L heart failure - end-stage 2. CAD s/p NSTEMI and CABG 08/2011 3. Hepatic encephalopathy 4. CKD Stage III 5. Pulmonary HTN    --primarily pulmonary venous HTN  6. DM2 7. OSA 8. Gout 9. Cardiac arrythmia 10. Morbid Obesity 11. Hyponatremia  Creatinine 1.89->1.96->2.01-> 2.0 today. Milrinone stopped yesterday, CVP elevated this am  Continue 80 mg torsemide BID. Lactulose decreased for patient comfort, ammonia level higher today but she is not symptomatic. She did walk to the bathroom without SOB. At this point, she just wishes to go home. Will plan to keep her admitted today and go home tomorrow. She declines hospice services.   Little Ishikawa, NP  7:54 AM  01/16/2017   Advanced Heart Failure Team  Pager [phone] M-F 7am-4pm.  Please contact [facility] for night-coverage after hours (4p -7a ) and weekends on [email or web address]   Patient seen and examined with Suzzette Righter, NP. We discussed all aspects of the encounter. I agree with the assessment and plan as stated above.   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 She is now off milrinone and IV lasix. Renal function stable. Will continue torsemide. We discussed the fact that fluid will likely return but she can slow the process down by drinking less.   She is eager to go home but also worried about herself. We had long talk about her overall health status. She is considering hospice but her family is reluctant. Seems amenable to it if she gets worse. She is DNR.   Ammonia is climbing again on lower dose lactulose. She does not want to take it. Will discuss with Pharmacy to see if [facility] is an option.   Vy Badley,MD 10:10 AM

## 2017-01-16 NOTE — Progress Notes (Signed)
Ms Armanda HeritageMininall is sitting in chair, awake alert appears comfortable, denies chest pain, denies dyspnea, complaining about her breakfast.  She is eager to go home tomorrow, her 66 year old daughter stays with her for part of the day, she will likely have home health care on discharge.  BP 94/64 (BP Location: Left Arm)   Pulse 69   Temp 97.6 F (36.4 C) (Axillary)   Resp 16   Ht 5\' 6"  (1.676 m)   Wt 96.2 kg (212 lb 1.6 oz)   SpO2 100%   BMI 34.23 kg/m  Awake alert NAD No increased work of breathing S1 S2 Trace edema Non focal  No new palliative recommendations at this time, we will be happy to continue goals of care conversations, should Ms Splawn require hospitalization in the future.  15 minutes spent Rosalin HawkingZeba Camara Renstrom MD [facility] [phone]

## 2017-01-16 NOTE — Discharge Summary (Signed)
Advanced Heart Failure Discharge Note  Discharge Summary   Patient ID: Christine Knight MRN: [id number], DOB/AGE: October 17, 1951 66 y.o. Admit date: 01/02/2017 D/C date:     01/17/2017   Primary Discharge Diagnoses:  1. Acute on chronic systolic CHF due to ICM EF 35-40% 10/2016 - with R>>L heart failure - end-stage 2. CAD s/p NSTEMI and CABG 08/2011 3. Hepatic encephalopathy 4. CKD Stage III 5. Pulmonary HTN    --primarily pulmonary venous HTN  6. DM2 7. OSA 8. Gout 9. Cardiac arrythmia 10. Morbid Obesity 11. Hyponatremia  Hospital Course: Christine Knight is a 66 y.o. female w/ PMHx significant for morbid obesity, systolic HF due to ICM (16/01 EF 25%; 07/2012 EF 35%; 03/18/13 EF 20-25%), CAD with h/o NSTEMI s/p CABG Oct 2012, pulmonary hypertension (PA peak 75mHg), and poorly controlled DM 2.  She presented to the HF clinic for follow up on 01/02/17. Her weight was up 33 pounds since her discharge from the hospital on 11/02/16. She was also confused at the time of her paramedicine visit on 01/01/17 and there was concern for hepatic encephalopathy.   She was admitted from the HF clinic on 01/02/17, admission weight was 238 pounds. She was started on an IV Lasix gtt at '15mg'$ /hr as well as Milrinone at 0.25. PICC line was placed and her initial Co ox was 76.3%, CVP was 26. Ammonia level elevated at the time of admission at 109. Lactulose was added to her medication regimen.   Her lasix gtt was increased to '30mg'$ /hr on 2/23. Diuresis was sluggish so metolazone '5mg'$  daily was added on 2/25. Received a dose of tolvaptan on 2/26 for Na of 119 which increased her sodium level to 133. On 2/28 diamox was added. Lasix gtt was discontinued on 01/09/17 and she started torsemide '60mg'$  BID.   On 3/2, she remained volume overloaded, dopamine 1.5 mcg was started and her milrinone was increased to 0.25. She was insistent that she go home, however was advised should she leave it would be against medical advice.  She was seen  by Palliative Care on 01/10/17, she was emotionally distraught and requested that Palliative care follow up in a few days. She did not tolerate po torsemide, her CVP was > 20 and she was very SOB with exertion. She also had increasing ventricular ectopy and runs of NSVT. Torsemide was stopped and she was started back on a Lasix gtt at '15mg'$ /hour on 3/3, also her dopamine was decreased to 1.5 mcg in an effort to reduce her episodes of NSVT.   On 3/4 metolazone was added to enhance diuresis. Her CVP remained elevated 19-20. Her Lasix gtt was increased to '30mg'$ /hr and her metolazone '5mg'$  daily was continued. She again met with Palliative Care on 01/13/17, her children have specifically told her that they do not want her to be a DNR. However, she did decide that she would like to be a DNR, but asked that this not be communicated with her family. She did decline Hospice services at home.   Her IV Lasix and metolazone were stopped on 01/14/17, as Christine Knight was adamant that she did not want to stay in the hospital. Her volume status remained stable on 3/7 and 3/8, although CVP remained high due to right heart failure. She will go home on torsemide '80mg'$  BID (an increase  and spironolactone '25mg'$  daily. She will not be on a beta blocker due to low output, no ACE due to allergy, no Entresto due to history of AKI and hyperkalemia. No  dig with CKD. She has previously been intolerant of Bidil with headaches and dizziness.   She will have follow up in the HF clinic on 01/22/17.   Discharge Weight Range: Admission weight 238 lbs , discharge weight 210 lbs.  Discharge Vitals: Blood pressure 99/65, pulse 92, temperature 97.6 F (36.4 C), temperature source Oral, resp. rate 15, height '5\' 6"'$  (1.676 m), weight 210 lb 4.8 oz (95.4 kg), SpO2 100 %.  Labs: Lab Results  Component Value Date   WBC 4.7 01/17/2017   HGB 12.7 01/17/2017   HCT 38.0 01/17/2017   MCV 89.2 01/17/2017   PLT 264 01/17/2017     Recent Labs Lab  01/17/17 0426  NA 132*  K 4.3  CL 93*  CO2 28  BUN 74*  CREATININE 2.10*  CALCIUM 9.2  GLUCOSE 142*   Lab Results  Component Value Date   CHOL 136 12/09/2015   HDL 26 (L) 12/09/2015   LDLCALC 94 12/09/2015   TRIG 82 12/09/2015   BNP (last 3 results)  Recent Labs  11/21/16 1550 12/06/16 1008 01/02/17 1507  BNP 1,037.5* 854.6* 827.6*    Discharge Medications   Allergies as of 01/17/2017      Reactions   Lisinopril Nausea And Vomiting, Other (See Comments), Cough   Coughing, throwing up, waking up coughing   Metformin And Related Anaphylaxis      Medication List    STOP taking these medications   metolazone 5 MG tablet Commonly known as:  ZAROXOLYN     TAKE these medications   acetaminophen 650 MG CR tablet Commonly known as:  TYLENOL Take 650 mg by mouth every 8 (eight) hours as needed (for arthritic pain). What changed:  Another medication with the same name was removed. Continue taking this medication, and follow the directions you see here.   aspirin EC 81 MG tablet Take 1 tablet (81 mg total) by mouth daily.   atorvastatin 40 MG tablet Commonly known as:  LIPITOR Take 1 tablet (40 mg total) by mouth daily.   CENTRUM SILVER 50+WOMEN PO Take 1 tablet by mouth every morning.   gabapentin 300 MG capsule Commonly known as:  NEURONTIN Take 300 mg by mouth 3 (three) times daily.   glipiZIDE 10 MG tablet Commonly known as:  GLUCOTROL Take 10 mg by mouth 2 (two) times daily.   potassium chloride SA 20 MEQ tablet Commonly known as:  K-DUR,KLOR-CON Take 3 tablets (60 mEq total) by mouth 2 (two) times daily.   rifaximin 550 MG Tabs tablet Commonly known as:  XIFAXAN Take 1 tablet (550 mg total) by mouth 2 (two) times daily.   spironolactone 25 MG tablet Commonly known as:  ALDACTONE Take 1 tablet (25 mg total) by mouth daily.   torsemide 20 MG tablet Commonly known as:  DEMADEX Take 4 tablets (80 mg total) by mouth 2 (two) times daily. What  changed:  how much to take       Disposition   The patient will be discharged in stable condition to home. Discharge Instructions    Call MD for:  persistant dizziness or light-headedness    Complete by:  As directed    Diet - low sodium heart healthy    Complete by:  As directed    Heart Failure patients record your daily weight using the same scale at the same time of day    Complete by:  As directed    Increase activity slowly    Complete by:  As  directed    STOP any activity that causes chest pain, shortness of breath, dizziness, sweating, or exessive weakness    Complete by:  As directed      Follow-up Information    Darrick Grinder, NP Follow up on 01/22/2017.   Specialty:  Cardiology Why:  at 2:30 [place] information: [address] [place] [address] [phone]             Duration of Discharge Encounter: Greater than 35 minutes   Signed, Arbutus Leas, NP 01/17/2017, 12:05 PM

## 2017-01-16 NOTE — Progress Notes (Signed)
CARDIAC REHAB PHASE I   PRE:  Rate/Rhythm: 72 SR  BP:  Supine:   Sitting: 112/68  Standing:    SaO2: 98 RA  MODE:  Ambulation: 550 ft   POST:  Rate/Rhythm: 84 SR with bigeminy PVC's  BP:  Supine:   Sitting: 66/54 recheck 107/75   Standing:    SaO2: 96 RA 1425-1455 Assisted X 1 to ambulate. Gait steady pt tires walking and c/o of some dizziness. She was able to walk 550 feet with several standing rest stops. BP after walk registered 66/54, rechecked 107/75. Pt stated that she did feel dizzy walking but better after sitting and resting.Left pt in recliner with call light in reach.  Melina CopaLisa Sophie Tamez RN 01/16/2017 2:52 PM

## 2017-01-16 NOTE — Telephone Encounter (Signed)
-----   Message from Traci R Turner, MD sent at 12/26/2016  7:27 PM EST ----- Please let patient know that they have significant sleep apnea and had successful CPAP titration and will be set up with CPAP unit.  Please let DME know that order is in EPIC.  Please set patient up for OV in 10 weeks 

## 2017-01-17 ENCOUNTER — Telehealth: Payer: Self-pay | Admitting: *Deleted

## 2017-01-17 LAB — BASIC METABOLIC PANEL
ANION GAP: 11 (ref 5–15)
BUN: 74 mg/dL — ABNORMAL HIGH (ref 6–20)
CALCIUM: 9.2 mg/dL (ref 8.9–10.3)
CO2: 28 mmol/L (ref 22–32)
CREATININE: 2.1 mg/dL — AB (ref 0.44–1.00)
Chloride: 93 mmol/L — ABNORMAL LOW (ref 101–111)
GFR, EST AFRICAN AMERICAN: 27 mL/min — AB (ref >60)
GFR, EST NON AFRICAN AMERICAN: 24 mL/min — AB (ref >60)
Glucose, Bld: 142 mg/dL — ABNORMAL HIGH (ref 65–99)
Potassium: 4.3 mmol/L (ref 3.5–5.1)
Sodium: 132 mmol/L — ABNORMAL LOW (ref 135–145)

## 2017-01-17 LAB — CBC
HCT: 38 % (ref 36.0–46.0)
HEMOGLOBIN: 12.7 g/dL (ref 12.0–15.0)
MCH: 29.8 pg (ref 26.0–34.0)
MCHC: 33.4 g/dL (ref 30.0–36.0)
MCV: 89.2 fL (ref 78.0–100.0)
PLATELETS: 264 10*3/uL (ref 150–400)
RBC: 4.26 MIL/uL (ref 3.87–5.11)
RDW: 15.7 % — ABNORMAL HIGH (ref 11.5–15.5)
WBC: 4.7 10*3/uL (ref 4.0–10.5)

## 2017-01-17 LAB — COOXEMETRY PANEL
CARBOXYHEMOGLOBIN: 1 % (ref 0.5–1.5)
Methemoglobin: 1 % (ref 0.0–1.5)
O2 SAT: 61.8 %
Total hemoglobin: 13 g/dL (ref 12.0–16.0)

## 2017-01-17 LAB — GLUCOSE, CAPILLARY
GLUCOSE-CAPILLARY: 180 mg/dL — AB (ref 65–99)
Glucose-Capillary: 156 mg/dL — ABNORMAL HIGH (ref 65–99)

## 2017-01-17 LAB — AMMONIA: AMMONIA: 65 umol/L — AB (ref 9–35)

## 2017-01-17 MED ORDER — RIFAXIMIN 550 MG PO TABS: 550.0000 mg | ORAL_TABLET | Freq: Two times a day (BID) | ORAL | 12 refills | Status: AC

## 2017-01-17 MED ORDER — POTASSIUM CHLORIDE CRYS ER 20 MEQ PO TBCR: 60.0000 meq | EXTENDED_RELEASE_TABLET | Freq: Two times a day (BID) | ORAL | 12 refills | Status: DC

## 2017-01-17 MED ORDER — TORSEMIDE 20 MG PO TABS: 80.0000 mg | ORAL_TABLET | Freq: Two times a day (BID) | ORAL | 12 refills | Status: DC

## 2017-01-17 MED ORDER — LACTULOSE 10 GM/15ML PO SOLN
30.0000 g | Freq: Two times a day (BID) | ORAL | Status: DC
  Administered 2017-01-17: 30 g via ORAL
  Filled 2017-01-17: qty 45

## 2017-01-17 MED FILL — TORSEMIDE 20 MG TABLET: 20 | 30 days supply | Qty: 240 | Fill #0

## 2017-01-17 MED FILL — KLOR-CON M20 TABLET: 20 | 30 days supply | Qty: 150 | Fill #0

## 2017-01-17 NOTE — Progress Notes (Signed)
Spoke with [employer] [facility] for Rifaxin. PA reference number is [id number]

## 2017-01-17 NOTE — Discharge Instructions (Signed)
Heart Failure °Heart failure means your heart has trouble pumping blood. This makes it hard for your body to work well. Heart failure is usually a long-term (chronic) condition. You must take good care of yourself and follow your doctor's treatment plan. °Follow these instructions at home: °· Take your heart medicine as told by your doctor. °? Do not stop taking medicine unless your doctor tells you to. °? Do not skip any dose of medicine. °? Refill your medicines before they run out. °? Take other medicines only as told by your doctor or pharmacist. °· Stay active if told by your doctor. The elderly and people with severe heart failure should talk with a doctor about physical activity. °· Eat heart-healthy foods. Choose foods that are without trans fat and are low in saturated fat, cholesterol, and salt (sodium). This includes fresh or frozen fruits and vegetables, fish, lean meats, fat-free or low-fat dairy foods, whole grains, and high-fiber foods. Lentils and dried peas and beans (legumes) are also good choices. °· Limit salt if told by your doctor. °· Cook in a healthy way. Roast, grill, broil, bake, poach, steam, or stir-fry foods. °· Limit fluids as told by your doctor. °· Weigh yourself every morning. Do this after you pee (urinate) and before you eat breakfast. Write down your weight to give to your doctor. °· Take your blood pressure and write it down if your doctor tells you to. °· Ask your doctor how to check your pulse. Check your pulse as told. °· Lose weight if told by your doctor. °· Stop smoking or chewing tobacco. Do not use gum or patches that help you quit without your doctor's approval. °· Schedule and go to doctor visits as told. °· Nonpregnant women should have no more than 1 drink a day. Men should have no more than 2 drinks a day. Talk to your doctor about drinking alcohol. °· Stop illegal drug use. °· Stay current with shots (immunizations). °· Manage your health conditions as told by your  doctor. °· Learn to manage your stress. °· Rest when you are tired. °· If it is really hot outside: °? Avoid intense activities. °? Use air conditioning or fans, or get in a cooler place. °? Avoid caffeine and alcohol. °? Wear loose-fitting, lightweight, and light-colored clothing. °· If it is really cold outside: °? Avoid intense activities. °? Layer your clothing. °? Wear mittens or gloves, a hat, and a scarf when going outside. °? Avoid alcohol. °· Learn about heart failure and get support as needed. °· Get help to maintain or improve your quality of life and your ability to care for yourself as needed. °Contact a doctor if: °· You gain weight quickly. °· You are more short of breath than usual. °· You cannot do your normal activities. °· You tire easily. °· You cough more than normal, especially with activity. °· You have any or more puffiness (swelling) in areas such as your hands, feet, ankles, or belly (abdomen). °· You cannot sleep because it is hard to breathe. °· You feel like your heart is beating fast (palpitations). °· You get dizzy or light-headed when you stand up. °Get help right away if: °· You have trouble breathing. °· There is a change in mental status, such as becoming less alert or not being able to focus. °· You have chest pain or discomfort. °· You faint. °This information is not intended to replace advice given to you by your health care provider. Make sure you   discuss any questions you have with your health care provider. °Document Released: 08/06/2008 Document Revised: 04/04/2016 Document Reviewed: 12/14/2012 °Elsevier Interactive Patient Education © 2017 [employer] ° °

## 2017-01-17 NOTE — Plan of Care (Signed)
Problem: Activity: Goal: Risk for activity intolerance will decrease Outcome: Completed/Met Date Met: 01/17/17 Pt is able to tolerate walking to & from the bathroom as well as to the bsc. Pt is also working with cardiac rehab & walks the unit w/o any distress.

## 2017-01-17 NOTE — Telephone Encounter (Signed)
-----   Message from Quintella Reichertraci R Turner, MD sent at 12/26/2016  7:27 PM EST ----- Please let patient know that they have significant sleep apnea and had successful CPAP titration and will be set up with CPAP unit.  Please let DME know that order is in EPIC.  Please set patient up for OV in 10 weeks

## 2017-01-17 NOTE — Progress Notes (Signed)
Pt discharging to home with daughter, d/c instructions discussed and pt. Verbalizes understanding.  PICC removed by IV and drsg applied, VSS, CHF education completed and pt verbalized understanding via teach back

## 2017-01-17 NOTE — Telephone Encounter (Signed)
Called and left message to call back.

## 2017-01-17 NOTE — Progress Notes (Signed)
Physical Therapy Treatment Patient Details Name: Christine Knight MRN: [id number] DOB: 08-28-1951 Today's Date: 01/17/2017    History of Present Illness Christine Knight is a 66 y.o. female w/ PMHx significant for morbid obesity, systolic HF due to ICM (10/12 EF 25%; 07/2012 EF 35%; 03/18/13 EF 20-25%), CAD with h/o NSTEMI s/p CABG Oct 2012, pulmonary hypertension (PA peak ), and poorly controlled DM 2.. Pt admitted for fluid overload    PT Comments    Pt performed increased gait and performed DGI.  Pt with slow guarded gait but no LOB observed.  Pt required slow gait to perform obstacles.     01/17/17 1106  Standardized Balance Assessment  Standardized Balance Assessment  Dynamic Gait Index  Dynamic Gait Index  Level Surface 3  Change in Gait Speed 2  Gait with Horizontal Head Turns 2  Gait with Vertical Head Turns 2  Gait and Pivot Turn 2  Step Over Obstacle 2  Step Around Obstacles 2  Steps 2  Total Score 17   Will continue with plan for no f/u PT at this time.  Plan to d/c home today.    Follow Up Recommendations  No PT follow up;Supervision - Intermittent     Equipment Recommendations  None recommended by PT    Recommendations for Other Services       Precautions / Restrictions Precautions Precautions: Fall Precaution Comments: mild SOB with activity Restrictions Weight Bearing Restrictions: No    Mobility  Bed Mobility               General bed mobility comments: Pt standing in room on arrival unassisted.    Transfers   Equipment used: None Transfers: Sit to/from Stand Sit to Stand: Independent         General transfer comment: Standing in room on arrival and able to sit without assistance.  Good technique and no LOB.    Ambulation/Gait Ambulation/Gait assistance: Supervision Ambulation Distance (Feet): 300 Feet Assistive device: None Gait Pattern/deviations: Step-through pattern;WFL(Within Functional Limits) Gait velocity: mildly  decreased Gait velocity interpretation: Below normal speed for age/gender General Gait Details: NO LOB, but slow guarded gait.  Pt required rest break in standing x2.     Stairs            Wheelchair Mobility    Modified Rankin (Stroke Patients Only)       Balance Overall balance assessment: Needs assistance   Sitting balance-[name] Scale: Normal       Standing balance-[name] Scale: Good                      Cognition Arousal/Alertness: Awake/alert Behavior During Therapy: WFL for tasks assessed/performed Overall Cognitive Status: Within Functional Limits for tasks assessed                      Exercises      General Comments        Pertinent Vitals/Pain Pain Assessment: No/denies pain    Home Living                      Prior Function            PT Goals (current goals can now be found in the care plan section) Acute Rehab PT Goals Patient Stated Goal: home asap Potential to Achieve Goals: Good Additional Goals Additional Goal #1: Pt to score >19 on DGI indicating minimal falls risk. Progress towards PT goals: Progressing toward goals  Frequency    Min 2X/week      PT Plan Current plan remains appropriate    Co-evaluation             End of Session   Activity Tolerance: Patient tolerated treatment well Patient left: in chair;with call bell/phone within reach Nurse Communication: Mobility status PT Visit Diagnosis: Muscle weakness (generalized) (M62.81)     Time: [id number] PT Time Calculation (min) (ACUTE ONLY): 23 min  Charges:  $Gait Training: 23-37 mins                    G Codes:       Christine Knight 01/17/2017, 11:11 AM Christine Knight, PTA pager [phone]

## 2017-01-17 NOTE — Care Management Important Message (Signed)
Important Message  Patient Details  Name: Christine Knight MRN: [id number] Date of Birth: 10/17/1951   Medicare Important Message Given:  Yes    Kyla BalzarineShealy, Destine Zirkle Abena 01/17/2017, 2:09 PM

## 2017-01-17 NOTE — Progress Notes (Signed)
Patient ID: Christine Knight, female   DOB: Sep 15, 1951, 66 y.o.   MRN: [id number]     Advanced Heart Failure Rounding Note  Primary Cardiologist: Dr. Gala RomneyBensimhon   Subjective:    Admitted from HF clinic 01/02/17 with recurrent marked volume overload and hepatic encephalopathy (ammonia 109). Admit weight 244.   Initially, was started on Lasix gtt and milrinone 0.25 mcg/kg/min. She was transitioned off Lasix to torsemide due to insistence that she go home.  She later decided to stay in hospital and dopamine gtt at 2.5 was added 3/2.  Dopamine decreased to 1.5 with NSVT and po amiodarone added to control rhythm.  Dopa, IV Lasix and metolazone stopped on 3/6. Started po torsemide 80mg  BID.   Milrinone was discontinued on 01/15/17, patient does not qualify for home milrinone. CVP is 20-22 this am, Co ox is 61.8.Feels tired and has no energy today. Objective:   Weight Range: 210 lb 4.8 oz (95.4 kg) Body mass index is 33.94 kg/m.   Vital Signs:   Temp:  [97.5 F (36.4 C)-98 F (36.7 C)] 97.6 F (36.4 C) (03/09 0400) Pulse Rate:  [55-92] 92 (03/08 2300) Resp:  [16-19] 19 (03/09 0400) BP: (94-114)/(64-82) 114/75 (03/09 0400) SpO2:  [96 %-100 %] 100 % (03/09 0400) Weight:  [210 lb 4.8 oz (95.4 kg)] 210 lb 4.8 oz (95.4 kg) (03/09 0400) Last BM Date: 01/16/17  Weight change: Filed Weights   01/15/17 0435 01/16/17 0300 01/17/17 0400  Weight: 214 lb 12.8 oz (97.4 kg) 212 lb 1.6 oz (96.2 kg) 210 lb 4.8 oz (95.4 kg)    Intake/Output:   Intake/Output Summary (Last 24 hours) at 01/17/17 0729 Last data filed at 01/17/17 0400  Gross per 24 hour  Intake             1050 ml  Output             2825 ml  Net            -1775 ml     Physical Exam: CVP 22 General:  Sitting in chair, sleepy.  HEENT: Normal Neck: supple. JVP to jaw..  Carotids 2+ bilat; no bruits. No thyromegaly or nodule noted.  Cor: PMI nondisplaced. Irregularly irregular. No rubs, gallops. 2/6 TR Lungs: CTA anteriorly    Abdomen: soft, NT, mildly distended, no HSM. No bruits or masses. +BS  Extremities: no cyanosis, clubbing, rash. 1+ pedal  Neuro: Lethargic, alert & orientedx3, cranial nerves grossly intact. moves all 4 extremities w/o difficulty. Affect pleasant. No asterixis.  Telemetry: Personally reviewed, Ventricular bigeminy rates in the 70's.   Labs: CBC  Recent Labs  01/16/17 0500 01/17/17 0426  WBC 4.6 4.7  HGB 12.4 12.7  HCT 37.0 38.0  MCV 88.7 89.2  PLT 263 264   Basic Metabolic Panel  Recent Labs  01/16/17 0500 01/17/17 0426  NA 130* 132*  K 3.8 4.3  CL 94* 93*  CO2 26 28  GLUCOSE 140* 142*  BUN 67* 74*  CREATININE 2.00* 2.10*  CALCIUM 8.7* 9.2    BNP: BNP (last 3 results)  Recent Labs  11/21/16 1550 12/06/16 1008 01/02/17 1507  BNP 1,037.5* 854.6* 827.6*      Medications:     Scheduled Medications: . aspirin EC  81 mg Oral Daily  . atorvastatin  40 mg Oral Daily  . enoxaparin (LOVENOX) injection  40 mg Subcutaneous Q24H  . gabapentin  300 mg Oral TID  . insulin aspart  0-15 Units Subcutaneous TID WC  .  insulin aspart  0-5 Units Subcutaneous QHS  . insulin aspart  4 Units Subcutaneous TID WC  . lactulose  30 g Oral BID  . potassium chloride  60 mEq Oral BID  . sodium chloride flush  10-40 mL Intracatheter Q12H  . sodium chloride flush  3 mL Intravenous Q12H  . spironolactone  25 mg Oral Daily  . torsemide  80 mg Oral BID    Infusions:   PRN Medications: sodium chloride, acetaminophen, ondansetron (ZOFRAN) IV, sodium chloride flush, sodium chloride flush   Assessment/Plan   1. Acute on chronic systolic CHF due to ICM EF 35-40% 10/2016 - with R>>L heart failure - end-stage 2. CAD s/p NSTEMI and CABG 08/2011 3. Hepatic encephalopathy 4. CKD Stage III 5. Pulmonary HTN    --primarily pulmonary venous HTN  6. DM2 7. OSA 8. Gout 9. Cardiac arrythmia 10. Morbid Obesity 11. Hyponatremia  Creatinine 1.89->1.96->2.01-> 2.0->2.1 today.  Continue 80 mg torsemide BID. Lactulose decreased for patient comfort. Had discussion with Dr. Gala Romney yesterday, she is considering Hospice but her family is reluctant. Will follow up with Pharmacy prior to discharge about Xifaxin.   Little Ishikawa, NP  7:29 AM  01/17/2017   Advanced Heart Failure Team  Pager [phone] M-F 7am-4pm.  Please contact [facility] for night-coverage after hours (4p -7a ) and weekends on [email or web address]  Patient seen with NP, agree with the above note.  She had end stage HF.  We have reached the limit of what we are going to be able to do medically to help her.  She is stable today.  CVP remains high.  Co-ox adequate at 62%.  She is insistent on going home, which I think is reasonable as we have nothing more in hospital to effectively offer her.  However, in the absence of hospice, I think she will be at high risk of readmission.  Family is reluctant for her to go on hospice.   Marca Ancona 01/17/2017 12:31 PM

## 2017-01-20 MED FILL — XIFAXAN 550 MG TABLET: 550 | 30 days supply | Qty: 60 | Fill #0

## 2017-01-22 ENCOUNTER — Ambulatory Visit (HOSPITAL_COMMUNITY)
Admission: RE | Admit: 2017-01-22 | Discharge: 2017-01-22 | Disposition: A | Discharge status: Home / Self Care | Payer: Medicare Other | Source: Ambulatory Visit | Attending: Internal Medicine | Admitting: Internal Medicine

## 2017-01-22 ENCOUNTER — Other Ambulatory Visit (HOSPITAL_COMMUNITY): Payer: Self-pay

## 2017-01-22 ENCOUNTER — Telehealth (HOSPITAL_COMMUNITY): Payer: Self-pay

## 2017-01-22 VITALS — BP 114/64 | HR 58 | Wt 209.8 lb

## 2017-01-22 DIAGNOSIS — I130 Hypertensive heart and chronic kidney disease with heart failure and stage 1 through stage 4 chronic kidney disease, or unspecified chronic kidney disease: Secondary | ICD-10-CM | POA: Diagnosis not present

## 2017-01-22 DIAGNOSIS — Z7982 Long term (current) use of aspirin: Secondary | ICD-10-CM | POA: Diagnosis not present

## 2017-01-22 DIAGNOSIS — Z951 Presence of aortocoronary bypass graft: Secondary | ICD-10-CM | POA: Insufficient documentation

## 2017-01-22 DIAGNOSIS — Z79899 Other long term (current) drug therapy: Secondary | ICD-10-CM | POA: Insufficient documentation

## 2017-01-22 DIAGNOSIS — E875 Hyperkalemia: Secondary | ICD-10-CM | POA: Insufficient documentation

## 2017-01-22 DIAGNOSIS — Z7984 Long term (current) use of oral hypoglycemic drugs: Secondary | ICD-10-CM | POA: Diagnosis not present

## 2017-01-22 DIAGNOSIS — I2510 Atherosclerotic heart disease of native coronary artery without angina pectoris: Secondary | ICD-10-CM | POA: Diagnosis not present

## 2017-01-22 DIAGNOSIS — I272 Pulmonary hypertension, unspecified: Secondary | ICD-10-CM | POA: Insufficient documentation

## 2017-01-22 DIAGNOSIS — M109 Gout, unspecified: Secondary | ICD-10-CM | POA: Insufficient documentation

## 2017-01-22 DIAGNOSIS — N183 Chronic kidney disease, stage 3 (moderate): Secondary | ICD-10-CM | POA: Insufficient documentation

## 2017-01-22 DIAGNOSIS — E1122 Type 2 diabetes mellitus with diabetic chronic kidney disease: Secondary | ICD-10-CM | POA: Diagnosis not present

## 2017-01-22 DIAGNOSIS — Z9889 Other specified postprocedural states: Secondary | ICD-10-CM | POA: Insufficient documentation

## 2017-01-22 DIAGNOSIS — I5022 Chronic systolic (congestive) heart failure: Secondary | ICD-10-CM | POA: Insufficient documentation

## 2017-01-22 DIAGNOSIS — I255 Ischemic cardiomyopathy: Secondary | ICD-10-CM | POA: Diagnosis not present

## 2017-01-22 DIAGNOSIS — I252 Old myocardial infarction: Secondary | ICD-10-CM | POA: Insufficient documentation

## 2017-01-22 LAB — BASIC METABOLIC PANEL
Anion gap: 14 (ref 5–15)
BUN: 101 mg/dL — ABNORMAL HIGH (ref 6–20)
CHLORIDE: 91 mmol/L — AB (ref 101–111)
CO2: 27 mmol/L (ref 22–32)
CREATININE: 2.46 mg/dL — AB (ref 0.44–1.00)
Calcium: 9.5 mg/dL (ref 8.9–10.3)
GFR calc non Af Amer: 19 mL/min — ABNORMAL LOW (ref >60)
GFR, EST AFRICAN AMERICAN: 23 mL/min — AB (ref >60)
Glucose, Bld: 138 mg/dL — ABNORMAL HIGH (ref 65–99)
Potassium: 4.9 mmol/L (ref 3.5–5.1)
Sodium: 132 mmol/L — ABNORMAL LOW (ref 135–145)

## 2017-01-22 NOTE — Progress Notes (Signed)
Patient ID: [name], female   DOB: November 04, 1951, 66 y.o.   MRN: [id number]     Advanced Heart Failure Clinic Note   PCP: Christine FeltsMike Knight  HF: Dr. Gala Knight   HPI: [name] is a 66 y.o. female w/ PMHx significant for morbid obesity, systolic HF due to ICM (10/12 EF 25%; 07/2012 EF 35%; 03/18/13 EF 20-25%), CAD with h/o NSTEMI s/p CABG Oct 2012, pulmonary hypertension (PA peak 72mmHg), and poorly controlled DM 2.  She was admitted to [facility] in 2014 for dyspnea, leg edema, and weight gain.  Lasix gtt used and she diuresed 15 liters. Diuresed 29 pounds.  ECHO- EF 20-25%.  Discharge weight 264 pounds.    Admitted 12/08/2015 - 12/28/2015 with worsening DOE and cough. Stated she had a 40 lb weight since 07/2015. She was diuresed aggressively with lasix gtt at 30 mg/hr and metolazone 5 mg BID. Diamox eventually added. Diuretics held temporarily towards end of stay with worsening Creatinine. Overall she diuresed >40 L and was down 70 lbs from admission weight. Pt had R/LHC with preserved CO and mild/mod Pulm HTN. Severe native CAD with patent CABG grafts. Discharge weight 237 lbs.   Admitted 6/1 -04/26/2016 with marked volume overload, low output hf, cardiorenal syndrome. Diuresed with high dose lasix 160 mg three times a day. Diuresed over 50 pounds. Transitioned to torsemide 80 mg daily. BB and losartan stopped. Discharge weight 222 pounds   Admitted 10/15 - 09/08/16 with marked volume overload. PT diuresed with high dose IV lasix, metolazone, as well as Milrinone to augment diuresis. Diamox also added.  Pt diuresed 35 L and lost 44 lbs. Pt transitioned back to po torsemide, and had to be adjusted further with AKI.  Discharge weight 224 lbs.  Admitted  12/6 through 11/02/16 with marked volume overload. Diuresed with IV lasix, metolazone, and milrinone. Overall diuresed 41 pounds. Discharged on torsemide 60 mg twice a day. Discharge weight was 211 pounds.   Admitted 2/22 through 01/17/2017 with marked volume  overload. Diuresed with lasix drip and milrinone 0.25 mcg. Ammonia was high so xifaxan started. Discharged on torsemide 80 mg twice a day. Discharge weight was 210 pounds.   Today she returns for post hospital follow up. Overall feeling ok. She is adamant she does not want intubation or CPR in the event of a cardiac or respiratory arrest. Complaining of fatigue. Says she is doing to much at home. SOB with steps. Not weighing at home. No CP. Taking all medications. Appetite great. Followed by Paramedicine. Daughter lives with her. She is able to drive to appointments.   ROS: All systems negative except as above, in PMH, and in Problem list.   Labs 2/17 K 4.3, Cr 1.56 Labs 04/26/2016: K 3.1 Creatinine 1.78 K 3.1 Labs 06/05/2016: K 4.3 Creatinine 1.25 Labas 06/13/2016: K 4.0 Creatinine 1.49  Labs 07/31/2016: K 4.5 Creatinine 2.27  Labs  08/05/2016: K 5.0 Creatinine 3.18  11/02/2017: K 3.6 Creatinine 1.59   03/18/13 ECHO EF 20-25% 06/17/2013 ECHO EF 35% RV ok (read formally after visit 35-40%) 03/15/2015 ECHO EF 30-35%, grade 3 DD, Moderate TR, Severely reduced RV, PA peak pressure 81 mm Hg 12/09/2015 ECHO EF 15-20%, Decreased LV diastolic compliance, RV mod reduced, PA peak 57 mmHg, Severe TR  04/14/16 Echo 15-20%  10/2016: EF Peak PA pressure 89 mm hg  EF 35-40% RV dysfunction.    R/LHC 12/13/15 Hemodynamics RA 25/25 (23) RV pressure 92/13  RV EDP 26 PA pressure 86/34 (56) PW mean 37 AO Pressure  114/72 (88) LV Pressure 101/12 (26) LV EDP 26 PVR 3.24 Fick CO/CI 5.85 / 2.41L/min   Prox LAD lesion, 100% stenosed. Prox Cx lesion, 99% stenosed. Mid RCA lesion, 80% stenosed. Dist RCA lesion, 100% stenosed. Patent grafts    Past Medical History:  Diagnosis Date  . CAD (coronary artery disease) 08/2011   NSTEMI with subsequent CABG 08/13/2011 with LIMA-LAD, SVG-diagonal, SVG-OM, SVG-PDA  . Carpal tunnel syndrome on both sides    "post OHS in 08/2011; resolved now" (03/17/2013)  . CHF (congestive  heart failure) (HCC)   . Chronic systolic heart failure (HCC)    Chronic systolic CHF  . Diabetes mellitus type 2 in obese (HCC) 08/10/11  . Dyspnea   . Ejection fraction < 50%    EF 20%, October, 2012  //  EF 25% December, 2012  //   EF 35%, echo, September, 2013  . Hyperlipidemia 08/10/11  . Hypertension   . Ischemic cardiomyopathy 08/13/2011   EF 20% in 08/2011, still 25% 10/21/2011  . Mitral regurgitation    Mild by echo 10/21/11  . Myocardial infarction 08/2011  . Obesity, morbid (HCC)   . Orthopnoea    "progressively worse over last 3 wks" (03/17/2013)  . Pleural effusion    Requiring L thoracentesis 09/02/11  . Pulmonary hypertension    Echo, September, 2013, 72 mmHg.    Current Outpatient Prescriptions  Medication Sig Dispense Refill  . acetaminophen (TYLENOL) 650 MG CR tablet Take 650 mg by mouth every 8 (eight) hours as needed (for arthritic pain).     Marland Kitchen aspirin 81 MG tablet Take 1 tablet (81 mg total) by mouth daily. 30 tablet 9  . atorvastatin (LIPITOR) 40 MG tablet Take 1 tablet (40 mg total) by mouth daily. 90 tablet 3  . gabapentin (NEURONTIN) 300 MG capsule Take 300 mg by mouth 3 (three) times daily.     Marland Kitchen glipiZIDE (GLUCOTROL) 10 MG tablet Take 10 mg by mouth 2 (two) times daily.    . Multiple Vitamins-Minerals (CENTRUM SILVER 50+WOMEN PO) Take 1 tablet by mouth every morning.    . potassium chloride SA (K-DUR,KLOR-CON) 20 MEQ tablet Take 3 tablets (60 mEq total) by mouth 2 (two) times daily. 150 tablet 12  . rifaximin (XIFAXAN) 550 MG TABS tablet Take 1 tablet (550 mg total) by mouth 2 (two) times daily. 60 tablet 12  . spironolactone (ALDACTONE) 25 MG tablet Take 1 tablet (25 mg total) by mouth daily. 30 tablet 5  . torsemide (DEMADEX) 20 MG tablet Take 4 tablets (80 mg total) by mouth 2 (two) times daily. 240 tablet 12   No current facility-administered medications for this encounter.      PHYSICAL EXAM: Vitals:   01/22/17 1453  BP: 114/64  BP Location: Left  Arm  Patient Position: Sitting  Cuff Size: Normal  Pulse: (!) 58  SpO2: 95%  Weight: 209 lb 12.8 oz (95.2 kg)   Wt Readings from Last 3 Encounters:  01/22/17 209 lb 12.8 oz (95.2 kg)  01/17/17 210 lb 4.8 oz (95.4 kg)  01/02/17 244 lb 6.4 oz (110.9 kg)     General: Obese. Walked slowly in the clinic.   HEENT: Normal Neck: JVP 8-9. Carotids 2+ bilat; no bruits. No thyromegaly or nodule noted.  Cor: PMI nondisplaced. Distant, Irregular  No M/G/R Lungs: Decreased basilar sounds.  Abdomen: Morbidly obese, soft, NT, ND, no HSM. No bruits or masses. +BS  Extremities: no cyanosis, clubbing, rash. R and LLE no edema Cool tough.  Neuro: alert & orientedx3, cranial nerves grossly intact. moves all 4 extremities w/o difficulty. Affect pleasant    ASSESSMENT/PLAN 1. Acute on Chronic systolic HF due to iCM EF 15-20% Echo 04/14/2016. - ECHO 10/2016 EF 35-40% RV dysfunction.   - NYHA class III. Volume status mildly elevated but looks as good as we can get her.  Continue torsemide 80 mg twice a day.  - No bb with low output.  - Continue spiro 25 mg daily. - No ACE due to allergy - No Entresto due to h/o of AKI - No dig with CKD.  - Intolerant bidil with difficulty with headaches, dizziness and hypotension.  - Reinforced fluid restriction to < 2 L daily, sodium restriction to less than 2000 mg daily, and the importance of daily weights.   2. CAD s/p NSTEMI and CABG 10/12  - No CP. Continue atorvastatin 40 mg daily.  - Severe native CAD with patent grafts on [facility] 12/13/15. Continue ASA 81 mg.  3. Morbid obesity - Again encouraged to limit portions and increase activity.   4. Pulmonary HTN - mild to mod RHC 12/13/15. PVR 3.24.  - No change.  5. CKD stage III BMET today.  6. DM2  - Per PCP. 7. Suspected sleep apnea. Day time fatigue-  -Sleep study has been arranged for 12/25/16. Encouraged to keep appt.  8. Gout - Per PCP  9. Hyperkalemia- bmet today.  10. Completed GOLD DNR form in the  clinic.   She is a high risk for readmit due to poor insight. Follow up with Paramedicine bi-weekly. Follow up in 2 weeks.  Greater than 50% of the (total minutes 25) visit spent in counseling/coordination of care regarding heart failure and goals of care. She refuses Palliative Care.   Yenifer Saccente, NP-C  3:11 PM   =.

## 2017-01-22 NOTE — Patient Instructions (Signed)
Routine lab work today. Will notify you of abnormal results, otherwise no news is good news!  Follow up 2 weeks with [name] NP-C.  Do the following things EVERYDAY: 1) Weigh yourself in the morning before breakfast. Write it down and keep it in a log. 2) Take your medicines as prescribed 3) Eat low salt foods-Limit salt (sodium) to 2000 mg per day.  4) Stay as active as you can everyday 5) Limit all fluids for the day to less than 2 liters 

## 2017-01-22 NOTE — Telephone Encounter (Signed)
PA approved [facility] Xifaxan [facility]17[facility].   Allie BossierApryl Cecilia Vancleve, PharmD PGY1 Pharmacy Resident [phone]) 01/22/2017 4:11 PM

## 2017-01-22 NOTE — Progress Notes (Signed)
Paramedicine Encounter    Patient ID: Christine Knight, female    DOB: 1951-10-17, 66 y.o.   MRN: [id number]   Patient Care Team: Coralee RudMichael R Duran, PA-C as PCP - General (Cardiology) Luis AbedJeffrey D Katz, MD (Cardiology)  Patient Active Problem List   Diagnosis Date Noted  . RVF (right ventricular failure) (HCC)   . Hepatic encephalopathy (HCC) 01/03/2017  . Systolic CHF, acute on chronic (HCC) 01/02/2017  . Acute on chronic systolic ACC/AHA stage C congestive heart failure (HCC) 12/06/2016  . CAD (coronary artery disease) 12/06/2016  . Suspected sleep apnea 11/21/2016  . Acute on chronic diastolic CHF (congestive heart failure) (HCC) 08/26/2016  . CHF (congestive heart failure) (HCC) 08/26/2016  . CKD (chronic kidney disease) stage 3, GFR 30-59 ml/min 05/02/2016  . Chest tightness   . AKI (acute kidney injury) (HCC) 12/08/2015  . Mass of lower lobe of left lung 07/25/2015  . Primary gout 07/25/2015  . NSTEMI- declined cath 05/29/2015  . DJD (degenerative joint disease), multiple sites 11/07/2014  . Painful diabetic neuropathy (HCC) 10/10/2014  . Hyperkalemia 06/17/2013  . Chronic systolic heart failure (HCC) 04/02/2013  . Hypothyroidism 03/18/2013  . Pulmonary hypertension (HCC)   . Hx of CABG 2012 08/30/2011  . Obesity, morbid-BMI 45   . Type 2 diabetes, uncontrolled, with neuropathy (HCC)   . Ischemic cardiomyopathy-30-35% May 2016 08/13/2011  . Hyperlipidemia LDL goal <70 08/10/2011    Current Outpatient Prescriptions:  .  acetaminophen (TYLENOL) 650 MG CR tablet, Take 650 mg by mouth every 8 (eight) hours as needed (for arthritic pain). , Disp: , Rfl:  .  aspirin 81 MG tablet, Take 1 tablet (81 mg total) by mouth daily., Disp: 30 tablet, Rfl: 9 .  atorvastatin (LIPITOR) 40 MG tablet, Take 1 tablet (40 mg total) by mouth daily., Disp: 90 tablet, Rfl: 3 .  gabapentin (NEURONTIN) 300 MG capsule, Take 300 mg by mouth 3 (three) times daily. , Disp: , Rfl:  .  glipiZIDE (GLUCOTROL)  10 MG tablet, Take 10 mg by mouth 2 (two) times daily., Disp: , Rfl:  .  Multiple Vitamins-Minerals (CENTRUM SILVER 50+WOMEN PO), Take 1 tablet by mouth every morning., Disp: , Rfl:  .  potassium chloride SA (K-DUR,KLOR-CON) 20 MEQ tablet, Take 3 tablets (60 mEq total) by mouth 2 (two) times daily., Disp: 150 tablet, Rfl: 12 .  rifaximin (XIFAXAN) 550 MG TABS tablet, Take 1 tablet (550 mg total) by mouth 2 (two) times daily., Disp: 60 tablet, Rfl: 12 .  spironolactone (ALDACTONE) 25 MG tablet, Take 1 tablet (25 mg total) by mouth daily., Disp: 30 tablet, Rfl: 5 .  torsemide (DEMADEX) 20 MG tablet, Take 4 tablets (80 mg total) by mouth 2 (two) times daily., Disp: 240 tablet, Rfl: 12 Allergies  Allergen Reactions  . Lisinopril Nausea And Vomiting, Other (See Comments) and Cough    Coughing, throwing up, waking up coughing  . Metformin And Related Anaphylaxis     Social History   Social History  . Marital status: Divorced    Spouse name: N/A  . Number of children: 3  . 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 Smoking status: Former Smoker    Types: Cigarettes    Quit date: 11/11/1978  . Smokeless tobacco: Never Used     Comment: 03/17/2013 "only a social smoker when I did smoke; ever bought any"  . Alcohol use Yes  Comment: OCCASIONAL  . Drug use: No  . Sexual activity: Not Currently    Birth control/ protection: Post-menopausal   Other Topics Concern  . Not on file   Social History Narrative   Lives with her sister and daughter.  Ambulates independently.    Physical Exam  Pulmonary/Chest: No respiratory distress. She has no wheezes.  Musculoskeletal: She exhibits no edema.  Skin: Skin is warm and dry. She is not diaphoretic.   Clinic visit ATF pt CAO x4 with no complaints today.  Pt stated that her scale is broke due to her moving. Pt stated that she has been tired a lot lately.  Pt is in the process of moving  therefore, she's been very stressed.  Pt requested that if she therefore she got a DNR prior to leaving the clinic today.  Pt has refused home health to visit her and refuses to change her mind.  rx bottles verified; pt refuses to use a pill box.  Sh stated that her son will buy test strips because she is currently out of them at this time.  Pt stated that she is taking all of her medications even the glipizide.    Amy Pt is on xifaxan due to confusion because her ammonia levels were high. Come back in 2 weeks.  Amy would like pt to be seen at least once a week.  I informed Amy that I will try to see pt twice a week, due to recent illness.    cbg 152    No future appointments.  BP 114/64 (BP Location: Right Arm, Patient Position: Sitting, Cuff Size: Normal)   Pulse (!) 54   Resp 16   Wt 209 lb (94.8 kg)   SpO2 97%   BMI 33.73 kg/m   Weight yesterday-couldn't weigh due to not having a scale Last visit weight-210    ACTION: Home visit completed Next visit planned for friday

## 2017-01-24 ENCOUNTER — Other Ambulatory Visit (HOSPITAL_COMMUNITY): Payer: Self-pay

## 2017-01-24 ENCOUNTER — Encounter (HOSPITAL_COMMUNITY): Payer: Self-pay | Admitting: Emergency Medicine

## 2017-01-24 ENCOUNTER — Telehealth (HOSPITAL_COMMUNITY): Payer: Self-pay | Admitting: Surgery

## 2017-01-24 ENCOUNTER — Emergency Department (HOSPITAL_COMMUNITY)
Admission: EM | Admit: 2017-01-24 | Discharge: 2017-01-24 | Disposition: A | Discharge status: Home / Self Care | Payer: Medicare Other | Attending: Physician Assistant | Admitting: Physician Assistant

## 2017-01-24 DIAGNOSIS — I2510 Atherosclerotic heart disease of native coronary artery without angina pectoris: Secondary | ICD-10-CM | POA: Insufficient documentation

## 2017-01-24 DIAGNOSIS — Z87891 Personal history of nicotine dependence: Secondary | ICD-10-CM | POA: Diagnosis not present

## 2017-01-24 DIAGNOSIS — I110 Hypertensive heart disease with heart failure: Secondary | ICD-10-CM | POA: Insufficient documentation

## 2017-01-24 DIAGNOSIS — R4182 Altered mental status, unspecified: Secondary | ICD-10-CM | POA: Diagnosis present

## 2017-01-24 DIAGNOSIS — Z951 Presence of aortocoronary bypass graft: Secondary | ICD-10-CM | POA: Diagnosis not present

## 2017-01-24 DIAGNOSIS — I5032 Chronic diastolic (congestive) heart failure: Secondary | ICD-10-CM | POA: Diagnosis not present

## 2017-01-24 DIAGNOSIS — K7290 Hepatic failure, unspecified without coma: Secondary | ICD-10-CM | POA: Diagnosis not present

## 2017-01-24 DIAGNOSIS — E119 Type 2 diabetes mellitus without complications: Secondary | ICD-10-CM | POA: Insufficient documentation

## 2017-01-24 DIAGNOSIS — I5022 Chronic systolic (congestive) heart failure: Secondary | ICD-10-CM | POA: Diagnosis not present

## 2017-01-24 DIAGNOSIS — R410 Disorientation, unspecified: Secondary | ICD-10-CM | POA: Insufficient documentation

## 2017-01-24 DIAGNOSIS — I509 Heart failure, unspecified: Secondary | ICD-10-CM

## 2017-01-24 DIAGNOSIS — Z7982 Long term (current) use of aspirin: Secondary | ICD-10-CM | POA: Insufficient documentation

## 2017-01-24 DIAGNOSIS — N183 Chronic kidney disease, stage 3 (moderate): Secondary | ICD-10-CM

## 2017-01-24 LAB — COMPREHENSIVE METABOLIC PANEL
ALBUMIN: 3.6 g/dL (ref 3.5–5.0)
ALT: 17 U/L (ref 14–54)
ANION GAP: 16 — AB (ref 5–15)
AST: 49 U/L — ABNORMAL HIGH (ref 15–41)
Alkaline Phosphatase: 122 U/L (ref 38–126)
BILIRUBIN TOTAL: 1.4 mg/dL — AB (ref 0.3–1.2)
BUN: 92 mg/dL — ABNORMAL HIGH (ref 6–20)
CO2: 23 mmol/L (ref 22–32)
Calcium: 9.7 mg/dL (ref 8.9–10.3)
Chloride: 97 mmol/L — ABNORMAL LOW (ref 101–111)
Creatinine, Ser: 2.3 mg/dL — ABNORMAL HIGH (ref 0.44–1.00)
GFR calc non Af Amer: 21 mL/min — ABNORMAL LOW (ref >60)
GFR, EST AFRICAN AMERICAN: 24 mL/min — AB (ref >60)
GLUCOSE: 97 mg/dL (ref 65–99)
POTASSIUM: 5.3 mmol/L — AB (ref 3.5–5.1)
SODIUM: 136 mmol/L (ref 135–145)
TOTAL PROTEIN: 8.1 g/dL (ref 6.5–8.1)

## 2017-01-24 LAB — CBC
HEMATOCRIT: 45.1 % (ref 36.0–46.0)
HEMOGLOBIN: 15 g/dL (ref 12.0–15.0)
MCH: 29.8 pg (ref 26.0–34.0)
MCHC: 33.3 g/dL (ref 30.0–36.0)
MCV: 89.5 fL (ref 78.0–100.0)
Platelets: 332 10*3/uL (ref 150–400)
RBC: 5.04 MIL/uL (ref 3.87–5.11)
RDW: 15.8 % — ABNORMAL HIGH (ref 11.5–15.5)
WBC: 6.1 10*3/uL (ref 4.0–10.5)

## 2017-01-24 LAB — AMMONIA: AMMONIA: 79 umol/L — AB (ref 9–35)

## 2017-01-24 MED ORDER — LACTULOSE 10 GM/15ML PO SOLN
30.0000 g | Freq: Two times a day (BID) | ORAL | Status: DC
  Administered 2017-01-24: 30 g via ORAL
  Filled 2017-01-24: qty 45

## 2017-01-24 MED ORDER — POTASSIUM CHLORIDE CRYS ER 20 MEQ PO TBCR: 40.0000 meq | EXTENDED_RELEASE_TABLET | Freq: Two times a day (BID) | ORAL | 12 refills | Status: DC

## 2017-01-24 MED ORDER — RIFAXIMIN 550 MG PO TABS
550.0000 mg | ORAL_TABLET | Freq: Two times a day (BID) | ORAL | Status: DC
  Administered 2017-01-24: 550 mg via ORAL
  Filled 2017-01-24: qty 1

## 2017-01-24 MED ORDER — TORSEMIDE 20 MG PO TABS: 80.0000 mg | ORAL_TABLET | Freq: Two times a day (BID) | ORAL | Status: DC

## 2017-01-24 MED ORDER — LACTULOSE 10 GM/15ML PO SOLN: 30.0000 g | Freq: Two times a day (BID) | ORAL | 6 refills | Status: DC

## 2017-01-24 NOTE — ED Triage Notes (Addendum)
PT had a normal visit with community paramedic two days ago. Community paramedic visited this AM and had to climb through window because PT did not answer the door. PT is slow to answer questions and states that she "feels confused." PT reports she believes she started feeling this way this AM. PT states, "I didn't take my medicine." PT has been admitted with high ammonia levels in the past. PT alert and oriented x4. PT reports she woke up this morning feeling confused and dizzy. PT reports she missed her meds last night and this AM.

## 2017-01-24 NOTE — ED Notes (Signed)
Cardiology PA at bedside. 

## 2017-01-24 NOTE — Progress Notes (Signed)
Paramedicine Encounter    Patient ID: Christine BabeBarbara Manner, female    DOB: 06/18/1951, 66 y.o.   MRN: [id number]   Patient Care Team: Coralee RudMichael R Duran, PA-C as PCP - General (Cardiology) Luis AbedJeffrey D Katz, MD (Cardiology)  Patient Active Problem List   Diagnosis Date Noted  . RVF (right ventricular failure) (HCC)   . Hepatic encephalopathy (HCC) 01/03/2017  . Systolic CHF, acute on chronic (HCC) 01/02/2017  . Acute on chronic systolic ACC/AHA stage C congestive heart failure (HCC) 12/06/2016  . CAD (coronary artery disease) 12/06/2016  . Suspected sleep apnea 11/21/2016  . Acute on chronic diastolic CHF (congestive heart failure) (HCC) 08/26/2016  . CHF (congestive heart failure) (HCC) 08/26/2016  . CKD (chronic kidney disease) stage 3, GFR 30-59 ml/min 05/02/2016  . Chest tightness   . AKI (acute kidney injury) (HCC) 12/08/2015  . Mass of lower lobe of left lung 07/25/2015  . Primary gout 07/25/2015  . NSTEMI- declined cath 05/29/2015  . DJD (degenerative joint disease), multiple sites 11/07/2014  . Painful diabetic neuropathy (HCC) 10/10/2014  . Hyperkalemia 06/17/2013  . Chronic systolic heart failure (HCC) 04/02/2013  . Hypothyroidism 03/18/2013  . Pulmonary hypertension (HCC)   . Hx of CABG 2012 08/30/2011  . Obesity, morbid-BMI 45   . Type 2 diabetes, uncontrolled, with neuropathy (HCC)   . Ischemic cardiomyopathy-30-35% May 2016 08/13/2011  . Hyperlipidemia LDL goal <70 08/10/2011    Current Outpatient Prescriptions:  .  acetaminophen (TYLENOL) 650 MG CR tablet, Take 650 mg by mouth every 8 (eight) hours as needed (for arthritic pain). , Disp: , Rfl:  .  aspirin 81 MG tablet, Take 1 tablet (81 mg total) by mouth daily., Disp: 30 tablet, Rfl: 9 .  atorvastatin (LIPITOR) 40 MG tablet, Take 1 tablet (40 mg total) by mouth daily., Disp: 90 tablet, Rfl: 3 .  gabapentin (NEURONTIN) 300 MG capsule, Take 300 mg by mouth 3 (three) times daily. , Disp: , Rfl:  .  glipiZIDE (GLUCOTROL)  10 MG tablet, Take 10 mg by mouth 2 (two) times daily., Disp: , Rfl:  .  Multiple Vitamins-Minerals (CENTRUM SILVER 50+WOMEN PO), Take 1 tablet by mouth every morning., Disp: , Rfl:  .  potassium chloride SA (K-DUR,KLOR-CON) 20 MEQ tablet, Take 3 tablets (60 mEq total) by mouth 2 (two) times daily., Disp: 150 tablet, Rfl: 12 .  rifaximin (XIFAXAN) 550 MG TABS tablet, Take 1 tablet (550 mg total) by mouth 2 (two) times daily., Disp: 60 tablet, Rfl: 12 .  spironolactone (ALDACTONE) 25 MG tablet, Take 1 tablet (25 mg total) by mouth daily., Disp: 30 tablet, Rfl: 5 .  torsemide (DEMADEX) 20 MG tablet, Take 4 tablets (80 mg total) by mouth 2 (two) times daily., Disp: 240 tablet, Rfl: 12 Allergies  Allergen Reactions  . Lisinopril Nausea And Vomiting, Other (See Comments) and Cough    Coughing, throwing up, waking up coughing  . Metformin And Related Anaphylaxis     Social History   Social History  . Marital status: Divorced    Spouse name: N/A  . Number of children: 3  . 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 Smoking status: Former Smoker    Types: Cigarettes    Quit date: 11/11/1978  . Smokeless tobacco: Never Used     Comment: 03/17/2013 "only a social smoker when I did smoke; ever bought any"  . Alcohol use Yes  Comment: OCCASIONAL  . Drug use: No  . Sexual activity: Not Currently    Birth control/ protection: Post-menopausal   Other Topics Concern  . Not on file   Social History Narrative   Lives with her sister and daughter.  Ambulates independently.    Physical Exam      Future Appointments Date Time Provider Department Center  02/05/2017 1:30 PM [facility] PA/NP [facility] None   ATF pt CAO to person in her house walking around confused. Pt couldn't open the door therefore I had to make force entry through her bedroom window.  Pt could not pass the stroke scale due to not understanding the instructions.  Pt had  spilled her pill bottles on the floor.  Pt stated that she "had not taken her medications because she is not right in the head". Pt kept stating that she feels "weird".  I called for EMS transport due to altered LOC.  Pt was transported to [facility] w/o incident. Pt's sister was made aware of the same.  cbg 84 BP (!) 118/50 (BP Location: Left Arm, Patient Position: Sitting)      ACTION: Home visit completed

## 2017-01-24 NOTE — Consult Note (Signed)
Advanced Heart Failure Team Emergency Room Note  Referring Physician: ED Physician - Dr. Corlis Leak  Primary Physician: Arnette Felts, MD Primary Cardiologist:  Dr. Gala Romney   Reason for Consultation: Confusion  HPI:    Christine Knight is a 66 y.o. female w/ PMHx significant for morbid obesity, systolic HF due to ICM (10/12 EF 25%; 07/2012 EF 35%; 03/18/13 EF 20-25%), CAD with h/o NSTEMI s/p CABG Oct 2012, pulmonary hypertension (PA peak ), and poorly controlled DM 2.  Admitted 2/22 through 01/17/2017 with marked volume overload. Diuresed with lasix drip and milrinone 0.25 mcg. Ammonia was high so xifaxan started. Discharged on torsemide 80 mg twice a day. Discharge weight was 210 pounds.   Seen in HF clinic 01/22/17. Was noted to have mild volume overload but have been limited by renal function. Made DNR by patient request.   Christine Knight, EMT-P of HF Paramedicine team went to see patient today. Pt did not come to door, and was walking around the house with her pills on the floor.   Brought to [facility].  Labs on admission include Ammonia 79, Creatinine 2.3, K 5.3, BNP 827.6. Weight down 1 lb from visit 01/22/17.   Pt denies any SOB, CP, lightheadedness or dizziness on my exam.  Pt states that after her appointment 01/22/17, she dropped her pill box, and all her medications spread across the floor.  She has not taken any medication since then, as she was afraid to take the "wrong ones".  Weight has been stable.  No more SOB than normal.  She states she did not call the HF clinic or Paramedic as she just "couldn't think straight". Mild tremor but not far from baseline   Review of Systems: [y] = yes, [ ]  = no   General: Weight gain [ ] ; Weight loss [ ] ; Anorexia [ y]; Fatigue [ y]; Fever [ ] ; Chills [ ] ; Weakness Cove.Etienne ]  Cardiac: Chest pain/pressure [ ] ; Resting SOB [ ] ; Exertional SOB [ ] ; Orthopnea [ ] ; Pedal Edema [ ] ; Palpitations [ ] ; Syncope [ ] ; Presyncope [ ] ; Paroxysmal nocturnal dyspnea[ ]     Pulmonary: Cough [ ] ; Wheezing[ ] ; Hemoptysis[ ] ; Sputum [ ] ; Snoring [ ]   GI: Vomiting[ ] ; Dysphagia[ ] ; Melena[ ] ; Hematochezia [ ] ; Heartburn[ ] ; Abdominal pain [ ] ; Constipation [ ] ; Diarrhea [ ] ; BRBPR [ ]   GU: Hematuria[ ] ; Dysuria [ ] ; Nocturia[ ]   Vascular: Pain in legs with walking [ ] ; Pain in feet with lying flat [ ] ; Non-healing sores [ ] ; Stroke [ ] ; TIA [ ] ; Slurred speech [ ] ;  Neuro: Headaches[ ] ; Vertigo[ ] ; Seizures[ ] ; Paresthesias[ ] ; Blurred vision [ ] ; Diplopia [ ] ; Vision changes [ ]  Confusion [y] Ortho/Skin: Arthritis Cove.Etienne ]; Joint pain Cove.Etienne ]; Muscle pain [ ] ; Joint swelling [ ] ; Back Pain [ ] ; Rash [ ]   Psych: Depression[y ]; Anxiety[ ]   Heme: Bleeding problems [ ] ; Clotting disorders [ ] ; Anemia [ ]   Endocrine: Diabetes [y]; Thyroid dysfunction[ ]   Home Medications Prior to Admission medications   Medication Sig Start Date End Date Taking? Authorizing Provider  acetaminophen (TYLENOL) 650 MG CR tablet Take 650 mg by mouth every 8 (eight) hours as needed (for arthritic pain).    Yes Historical Provider, MD  aspirin 81 MG tablet Take 1 tablet (81 mg total) by mouth daily. 11/15/13  Yes Christine Abed, MD  atorvastatin (LIPITOR) 40 MG tablet Take 1 tablet (40 mg total) by mouth daily. 11/22/16  02/20/17 Yes Christine FreerMichael Andrew Tillery, PA-C  gabapentin (NEURONTIN) 300 MG capsule Take 300 mg by mouth 3 (three) times daily.    Yes Historical Provider, MD  glipiZIDE (GLUCOTROL) 10 MG tablet Take 10 mg by mouth 2 (two) times daily.   Yes Historical Provider, MD  Multiple Vitamins-Minerals (CENTRUM SILVER 50+WOMEN PO) Take 1 tablet by mouth every morning.   Yes Historical Provider, MD  potassium chloride SA (K-DUR,KLOR-CON) 20 MEQ tablet Take 3 tablets (60 mEq total) by mouth 2 (two) times daily. 01/17/17  Yes Christine IshikawaErin E Smith, NP  rifaximin (XIFAXAN) 550 MG TABS tablet Take 1 tablet (550 mg total) by mouth 2 (two) times daily. 01/17/17  Yes Christine IshikawaErin E Smith, NP  spironolactone (ALDACTONE) 25 MG  tablet Take 1 tablet (25 mg total) by mouth daily. 11/06/16  Yes Christine Pattyaniel R Sallie Staron, MD  torsemide (DEMADEX) 20 MG tablet Take 4 tablets (80 mg total) by mouth 2 (two) times daily. 01/17/17  Yes Christine IshikawaErin E Smith, NP    Past Medical History: Past Medical History:  Diagnosis Date  . CAD (coronary artery disease) 08/2011   NSTEMI with subsequent CABG 08/13/2011 with LIMA-LAD, SVG-diagonal, SVG-OM, SVG-PDA  . Carpal tunnel syndrome on both sides    "post OHS in 08/2011; resolved now" (03/17/2013)  . CHF (congestive heart failure) (HCC)   . Chronic systolic heart failure (HCC)    Chronic systolic CHF  . Diabetes mellitus type 2 in obese (HCC) 08/10/11  . Dyspnea   . Ejection fraction < 50%    EF 20%, October, 2012  //  EF 25% December, 2012  //   EF 35%, echo, September, 2013  . Hyperlipidemia 08/10/11  . Hypertension   . Ischemic cardiomyopathy 08/13/2011   EF 20% in 08/2011, still 25% 10/21/2011  . Mitral regurgitation    Mild by echo 10/21/11  . Myocardial infarction 08/2011  . Obesity, morbid (HCC)   . Orthopnoea    "progressively worse over last 3 wks" (03/17/2013)  . Pleural effusion    Requiring L thoracentesis 09/02/11  . Pulmonary hypertension    Echo, September, 2013, 72 mmHg.    Past Surgical History: Past Surgical History:  Procedure Laterality Date  . CARDIAC CATHETERIZATION N/A 12/13/2015   Procedure: Right/Left Heart Cath and Coronary/Graft Angiography;  Surgeon: Peter M SwazilandJordan, MD;  [facility];  Service: Cardiovascular;  Laterality: N/A;  . CHOLECYSTECTOMY    . CORONARY ARTERY BYPASS GRAFT  08/13/11   CABG x4 with LIMA to LAD, SVG to Diag, SVG to OM, SVG to PDA, EVH via both thighs  . LAPAROSCOPY  1980's?   "removed gallstones; not gallbladder" (03/17/2013)    Family History: Family History  Problem Relation Age of Onset  . Heart disease Father   . Hypertension Maternal Grandmother   . Hypertension Sister   . Cancer Neg Hx   . Diabetes Neg Hx   . Kidney  disease Neg Hx     Social History: Social History   Social History  . Marital status: Divorced    Spouse name: N/A  . Number of children: 3  . 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 Smoking status: Former Smoker    Types: Cigarettes    Quit date: 11/11/1978  . Smokeless tobacco: Never Used     Comment: 03/17/2013 "only a social smoker when I did smoke; ever bought any"  . Alcohol use Yes  Comment: OCCASIONAL  . Drug use: No  . Sexual activity: Not Currently    Birth control/ protection: Post-menopausal   Other Topics Concern  . None   Social History Narrative   Lives with her sister and daughter.  Ambulates independently.    Allergies:  Allergies  Allergen Reactions  . Lisinopril Nausea And Vomiting, Other (See Comments) and Cough    Coughing, throwing up, waking up coughing  . Metformin And Related Anaphylaxis    Objective:    Vital Signs:   Temp:  [97.7 F (36.5 C)] 97.7 F (36.5 C) (03/16 1207) Pulse Rate:  [69-90] 69 (03/16 1207) Resp:  [20-22] 22 (03/16 1207) BP: (118-134)/(50-70) 134/70 (03/16 1207) SpO2:  [98 %] 98 % (03/16 1207)    Intake/Output:  No intake or output data in the 24 hours ending 01/24/17 1328   Physical Exam: General:  Obese. Tearful.  HEENT: Normal Neck: supple. JVP 8- cm. Carotids 2+ bilat; no bruits. No lymphadenopathy or thyromegaly appreciated. Cor: PMI nondisplaced. Regular but with occasional ectopy. No M/G/R.     Lungs: Slightly diminished, but otherwise clear. No wheeze Abdomen: Morbidly obese, soft, NT, ND, no HSM. No bruits or masses. Good BS  Extremities: No cyanosis, clubbing, rash, trace edema Neuro: Alert & orientedx3, cranial nerves grossly intact. moves all 4 extremities w/o difficulty. Mild tremor Affect pleasant  Telemetry: Reviewed, NSR  Labs: Basic Metabolic Panel:  Recent Labs Lab 01/22/17 1534 01/24/17 1212  NA 132* 136  K 4.9 5.3*  CL 91*  97*  CO2 27 23  GLUCOSE 138* 97  BUN 101* 92*  CREATININE 2.46* 2.30*  CALCIUM 9.5 9.7    Liver Function Tests:  Recent Labs Lab 01/24/17 1212  AST 49*  ALT 17  ALKPHOS 122  BILITOT 1.4*  PROT 8.1  ALBUMIN 3.6   No results for input(s): LIPASE, AMYLASE in the last 168 hours.  Recent Labs Lab 01/24/17 1212  AMMONIA 79*    CBC:  Recent Labs Lab 01/24/17 1212  WBC 6.1  HGB 15.0  HCT 45.1  MCV 89.5  PLT 332    Cardiac Enzymes: No results for input(s): CKTOTAL, CKMB, CKMBINDEX, TROPONINI in the last 168 hours.  BNP: BNP (last 3 results)  Recent Labs  11/21/16 1550 12/06/16 1008 01/02/17 1507  BNP 1,037.5* 854.6* 827.6*    ProBNP (last 3 results) No results for input(s): PROBNP in the last 8760 hours.   CBG: No results for input(s): GLUCAP in the last 168 hours.  Coagulation Studies: No results for input(s): LABPROT, INR in the last 72 hours.  Other results: EKG: Reviewed Personally.  NSR  Imaging:  No results found.   Medications:     Current Medications: . rifaximin  550 mg Oral BID     Infusions:    Assessment/Plan   1. Hepatic Encephalopathy 2. Chronic systolic HF due to iCM EF 35-40% with RV dysfunction.  3. CAD s/p NSTEMI and CABG 10/012 4. CKD Stage III 5. Hyperkalemia 6. DNR  Pts mental status seems relatively near her baseline. She did not take her medications due to a combination of her hepatic encephalopathy and anxiety. Resume Xifaxan. Will start on Lactulose 25 mg BID and give dose of both now.   If patient discharges today, we can arrange to have Paramedicine refill her Pill box today (confirmed with Paramedicine already).  Volume status looks stable.  Resume home torsemide.   Potassium elevated. Would decrease potassium to 40 meq BID and recheck next week  in HF clinic. Creatinine relatively stable.   Think OK for discharge if paramedicine can see in home today. Will discuss further with MD.   Length of  Stay: 0  Christine Freer, PA-C  01/24/2017, 1:28 PM  Advanced Heart Failure Team Pager [phone] (M-F; 7a - 4p)  Please contact [facility] for night-coverage after hours (4p -7a ) and weekends on [email or web address]  Patient seen and examined with Christine Saber, PA-C. We discussed all aspects of the encounter. I agree with the assessment and plan as stated above.   Ms. Mikels is well known to the HF team due to multiple readmissions for end-stage RV failure, volume overload and hepatic encephalopathy. Presents to ER with confusion after she dropped her medicines on the floor 2 days ago and hasnt been taking them. On exam, volume status surprisingly well control. Respiratory status at baseline. She seems very clear and able to engage in full conversation. Will give dose of lactulose and xifaxin here. We have contacted the paramedicine team and they will meet her at her house this afternoon and refill her pill box. Agree with adjusting diuretics as above. Discussed with ER MD and nursing staff personally. I feel she is stable to go home with outpatient f/u.   Bowe Sidor,MD 4:08 PM

## 2017-01-24 NOTE — Telephone Encounter (Signed)
[facility] regarding Ms. Defreitas's current visit to ED and discharge.  Ms. Copper has agreed to visit patient this evening [facility] to refill her pillbox and check her mental status. Otilio SaberAndy Tillery PA made aware that I had spoken with [facility] and confirmed this plan.

## 2017-01-24 NOTE — ED Notes (Signed)
PT assisted to chair to eat a meal. PT has phone and call bell at side. PT instructed to push callbell and request assistance to get up from chair.PT agrees.

## 2017-01-24 NOTE — Progress Notes (Signed)
Paramedicine Encounter    Patient ID: Christine Knight, female    DOB: [birth year], 66 y.o.   MRN: [id number]   Patient Care Team: Coralee Rud, PA-C as PCP - General (Cardiology) Luis Abed, MD (Cardiology)  Patient Active Problem List   Diagnosis Date Noted  . RVF (right ventricular failure) (HCC)   . Hepatic encephalopathy (HCC) 01/03/2017  . Systolic CHF, acute on chronic (HCC) 01/02/2017  . Acute on chronic systolic ACC/AHA stage C congestive heart failure (HCC) 12/06/2016  . CAD (coronary artery disease) 12/06/2016  . Suspected sleep apnea 11/21/2016  . Acute on chronic diastolic CHF (congestive heart failure) (HCC) 08/26/2016  . CHF (congestive heart failure) (HCC) 08/26/2016  . CKD (chronic kidney disease) stage 3, GFR 30-59 ml/min 05/02/2016  . Chest tightness   . AKI (acute kidney injury) (HCC) 12/08/2015  . Mass of lower lobe of left lung 07/25/2015  . Primary gout 07/25/2015  . NSTEMI- declined cath 05/29/2015  . DJD (degenerative joint disease), multiple sites 11/07/2014  . Painful diabetic neuropathy (HCC) 10/10/2014  . Hyperkalemia 06/17/2013  . Chronic systolic heart failure (HCC) 04/02/2013  . Hypothyroidism 03/18/2013  . Pulmonary hypertension (HCC)   . Hx of CABG 2012 08/30/2011  . Obesity, morbid-BMI 45   . Type 2 diabetes, uncontrolled, with neuropathy (HCC)   . Ischemic cardiomyopathy-30-35% May 2016 08/13/2011  . Hyperlipidemia LDL goal <70 08/10/2011    Current Outpatient Prescriptions:  .  acetaminophen (TYLENOL) 650 MG CR tablet, Take 650 mg by mouth every 8 (eight) hours as needed (for arthritic pain). , Disp: , Rfl:  .  aspirin 81 MG tablet, Take 1 tablet (81 mg total) by mouth daily., Disp: 30 tablet, Rfl: 9 .  atorvastatin (LIPITOR) 40 MG tablet, Take 1 tablet (40 mg total) by mouth daily., Disp: 90 tablet, Rfl: 3 .  gabapentin (NEURONTIN) 300 MG capsule, Take 300 mg by mouth 3 (three) times daily. , Disp: , Rfl:  .  glipiZIDE (GLUCOTROL)  10 MG tablet, Take 10 mg by mouth 2 (two) times daily., Disp: , Rfl:  .  lactulose (CHRONULAC) 10 GM/15ML solution, Take 45 mLs (30 g total) by mouth 2 (two) times daily., Disp: 240 mL, Rfl: 6 .  Multiple Vitamins-Minerals (CENTRUM SILVER 50+WOMEN PO), Take 1 tablet by mouth every morning., Disp: , Rfl:  .  [START ON 01/25/2017] potassium chloride SA (K-DUR,KLOR-CON) 20 MEQ tablet, Take 2 tablets (40 mEq total) by mouth 2 (two) times daily., Disp: 150 tablet, Rfl: 12 .  rifaximin (XIFAXAN) 550 MG TABS tablet, Take 1 tablet (550 mg total) by mouth 2 (two) times daily., Disp: 60 tablet, Rfl: 12 .  spironolactone (ALDACTONE) 25 MG tablet, Take 1 tablet (25 mg total) by mouth daily., Disp: 30 tablet, Rfl: 5 .  torsemide (DEMADEX) 20 MG tablet, Take 4 tablets (80 mg total) by mouth 2 (two) times daily., Disp: 240 tablet, Rfl: 12 No current facility-administered medications for this visit.   Facility-Administered Medications Ordered in Other Visits:  .  lactulose (CHRONULAC) 10 GM/15ML solution 30 g, 30 g, Oral, BID, Mariam Dollar Tillery, PA-C, 30 g at 01/24/17 1425 .  rifaximin (XIFAXAN) tablet 550 mg, 550 mg, Oral, BID, Mariam Dollar Tillery, PA-C, 550 mg at 01/24/17 1425 Allergies  Allergen Reactions  . Lisinopril Nausea And Vomiting, Other (See Comments) and Cough    Coughing, throwing up, waking up coughing  . Metformin And Related Anaphylaxis     Social History   Social History  .  Marital status: Divorced    Spouse name: N/A  . Number of children: 3  . 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 Smoking status: Former Smoker    Types: Cigarettes    Quit date: 11/11/1978  . Smokeless tobacco: Never Used     Comment: 03/17/2013 "only a social smoker when I did smoke; ever bought any"  . Alcohol use Yes     Comment: OCCASIONAL  . Drug use: No  . Sexual activity: Not Currently    Birth control/ protection: Post-menopausal   Other  Topics Concern  . Not on file   Social History Narrative   Lives with her sister and daughter.  Ambulates independently.    Physical Exam      Future Appointments Date Time Provider Department Center  01/28/2017 11:00 AM [facility] LAB [facility] None  02/05/2017 1:30 PM [facility] PA/NP [facility] None   ATF pt CAO x4 sitting in the living room talking with her family.  Pt was home with her sister, son and niece.  They were discussing care with pt due to recent ED visit today.  Pt has been non-compliant with her medications and is currently living alone.  Pt agreed to allow her daughter to come and stay with her until she gets better. Pt's son is also going to look into getting a life alert for her.  Pt's pill box was filled according to her most recent medication list.  Pt's family was asked to put the missing medications in the box (I gave them instructions on where to place them).  Pt was missing several medications due to her spilling them on the floor when she was confused earlier.  Pt took her evening medications prior to me leaving.   **lactulose will not be picked up until Monday due to outpt pharmacy being closed **spiro, glipizide, and aspirin was not included; pt's son will pick up meds and will fill pill box it accordingly There were no vitals taken for this visit.     ACTION: Home visit completed

## 2017-01-24 NOTE — ED Provider Notes (Signed)
[facility] Provider Note   CSN: [id number] Arrival date & time: 01/24/17  1157     History   Chief Complaint Chief Complaint  Patient presents with  . Altered Mental Status    HPI Christine Knight is a 66 y.o. female.  HPI   Patient is 66 year old female with complciated ast medical history followed with Dr. [facility]aci [facility] congestive heart failure clinic. Patient had home care EMS that went to check on her and found patient altered. Patient reportedly had dropped all her medications on the floor and had not been taking them for the last 24 hours. Patient was brought here to emergency department. Patient appeared alert and oriented 4 on arrival.  Past Medical History:  Diagnosis Date  . CAD (coronary artery disease) 08/2011   NSTEMI with subsequent CABG 08/13/2011 with LIMA-LAD, SVG-diagonal, SVG-OM, SVG-PDA  . Carpal tunnel syndrome on both sides    "post OHS in 08/2011; resolved now" (03/17/2013)  . CHF (congestive heart failure) (HCC)   . Chronic systolic heart failure (HCC)    Chronic systolic CHF  . Diabetes mellitus type 2 in obese (HCC) 08/10/11  . Dyspnea   . Ejection fraction < 50%    EF 20%, October, 2012  //  EF 25% December, 2012  //   EF 35%, echo, September, 2013  . Hyperlipidemia 08/10/11  . Hypertension   . Ischemic cardiomyopathy 08/13/2011   EF 20% in 08/2011, still 25% 10/21/2011  . Mitral regurgitation    Mild by echo 10/21/11  . Myocardial infarction 08/2011  . Obesity, morbid (HCC)   . Orthopnoea    "progressively worse over last 3 wks" (03/17/2013)  . Pleural effusion    Requiring L thoracentesis 09/02/11  . Pulmonary hypertension    Echo, September, 2013, 72 mmHg.    Patient Active Problem List   Diagnosis Date Noted  . RVF (right ventricular failure) (HCC)   . Hepatic encephalopathy (HCC) 01/03/2017  . Systolic CHF, acute on chronic (HCC) 01/02/2017  . Acute on chronic systolic ACC/AHA stage C congestive heart failure (HCC) 12/06/2016  .  CAD (coronary artery disease) 12/06/2016  . Suspected sleep apnea 11/21/2016  . Acute on chronic diastolic CHF (congestive heart failure) (HCC) 08/26/2016  . CHF (congestive heart failure) (HCC) 08/26/2016  . CKD (chronic kidney disease) stage 3, GFR 30-59 ml/min 05/02/2016  . Chest tightness   . AKI (acute kidney injury) (HCC) 12/08/2015  . Mass of lower lobe of left lung 07/25/2015  . Primary gout 07/25/2015  . NSTEMI- declined cath 05/29/2015  . DJD (degenerative joint disease), multiple sites 11/07/2014  . Painful diabetic neuropathy (HCC) 10/10/2014  . Hyperkalemia 06/17/2013  . Chronic systolic heart failure (HCC) 04/02/2013  . Hypothyroidism 03/18/2013  . Pulmonary hypertension (HCC)   . Hx of CABG 2012 08/30/2011  . Obesity, morbid-BMI 45   . Type 2 diabetes, uncontrolled, with neuropathy (HCC)   . Ischemic cardiomyopathy-30-35% May 2016 08/13/2011  . Hyperlipidemia LDL goal <70 08/10/2011    Past Surgical History:  Procedure Laterality Date  . CARDIAC CATHETERIZATION N/A 12/13/2015   Procedure: Right/Left Heart Cath and Coronary/Graft Angiography;  Surgeon: Peter M SwazilandJordan, MD;  [facility];  Service: Cardiovascular;  Laterality: N/A;  . CHOLECYSTECTOMY    . CORONARY ARTERY BYPASS GRAFT  08/13/11   CABG x4 with LIMA to LAD, SVG to Diag, SVG to OM, SVG to PDA, EVH via both thighs  . LAPAROSCOPY  1980's?   "removed gallstones; not gallbladder" (03/17/2013)  OB History    No data available       Home Medications    Prior to Admission medications   Medication Sig Start Date End Date Taking? Authorizing Provider  acetaminophen (TYLENOL) 650 MG CR tablet Take 650 mg by mouth every 8 (eight) hours as needed (for arthritic pain).    Yes Historical Provider, MD  aspirin 81 MG tablet Take 1 tablet (81 mg total) by mouth daily. 11/15/13  Yes Luis Abed, MD  atorvastatin (LIPITOR) 40 MG tablet Take 1 tablet (40 mg total) by mouth daily. 11/22/16 02/20/17 Yes  [name], PA-C  gabapentin (NEURONTIN) 300 MG capsule Take 300 mg by mouth 3 (three) times daily.    Yes Historical Provider, MD  glipiZIDE (GLUCOTROL) 10 MG tablet Take 10 mg by mouth 2 (two) times daily.   Yes Historical Provider, MD  Multiple Vitamins-Minerals (CENTRUM SILVER 50+WOMEN PO) Take 1 tablet by mouth every morning.   Yes Historical Provider, MD  potassium chloride SA (K-DUR,KLOR-CON) 20 MEQ tablet Take 3 tablets (60 mEq total) by mouth 2 (two) times daily. 01/17/17  Yes Little Ishikawa, NP  rifaximin (XIFAXAN) 550 MG TABS tablet Take 1 tablet (550 mg total) by mouth 2 (two) times daily. 01/17/17  Yes Little Ishikawa, NP  spironolactone (ALDACTONE) 25 MG tablet Take 1 tablet (25 mg total) by mouth daily. 11/06/16  Yes Dolores Patty, MD  torsemide (DEMADEX) 20 MG tablet Take 4 tablets (80 mg total) by mouth 2 (two) times daily. 01/17/17  Yes Little Ishikawa, NP    Family History Family History  Problem Relation Age of Onset  . Heart disease Father   . Hypertension Maternal Grandmother   . Hypertension Sister   . Cancer Neg Hx   . Diabetes Neg Hx   . Kidney disease Neg Hx     Social History Social History  Substance Use Topics  . Smoking status: Former Smoker    Types: Cigarettes    Quit date: 11/11/1978  . Smokeless tobacco: Never Used     Comment: 03/17/2013 "only a social smoker when I did smoke; ever bought any"  . Alcohol use Yes     Comment: OCCASIONAL     Allergies   Lisinopril and Metformin and related   Review of Systems Review of Systems  Constitutional: Negative for activity change.  Respiratory: Negative for shortness of breath.   Cardiovascular: Negative for chest pain.  Gastrointestinal: Negative for abdominal pain.  Psychiatric/Behavioral: Positive for behavioral problems and confusion.  All other systems reviewed and are negative.    Physical Exam Updated Vital Signs BP 101/68   Pulse 65   Temp 97.7 F (36.5 C) (Oral)   Resp 19   SpO2  96%   Physical Exam  Constitutional: She is oriented to person, place, and time. She appears well-developed and well-nourished.  HENT:  Head: Normocephalic and atraumatic.  Eyes: Right eye exhibits no discharge. Left eye exhibits no discharge.  Cardiovascular: Normal rate and regular rhythm.   Pulmonary/Chest: Effort normal and breath sounds normal. No respiratory distress.  Neurological: She is oriented to person, place, and time. No cranial nerve deficit. Coordination normal.  Skin: Skin is warm and dry. She is not diaphoretic.  Psychiatric: She has a normal mood and affect.  Nursing note and vitals reviewed.    ED Treatments / Results  Labs (all labs ordered are listed, but only abnormal results are displayed) Labs Reviewed  COMPREHENSIVE METABOLIC PANEL - Abnormal;  Notable for the following:       Result Value   Potassium 5.3 (*)    Chloride 97 (*)    BUN 92 (*)    Creatinine, Ser 2.30 (*)    AST 49 (*)    Total Bilirubin 1.4 (*)    GFR calc non Af Amer 21 (*)    GFR calc Af Amer 24 (*)    Anion gap 16 (*)    All other components within normal limits  CBC - Abnormal; Notable for the following:    RDW 15.8 (*)    All other components within normal limits  AMMONIA - Abnormal; Notable for the following:    Ammonia 79 (*)    All other components within normal limits    EKG  EKG Interpretation  Date/Time:  Friday January 24 2017 12:06:22 EDT Ventricular Rate:  71 PR Interval:    QRS Duration: 107 QT Interval:  395 QTC Calculation: 430 R Axis:   159 Text Interpretation:  Sinus rhythm Anterior infarct, old ST depr, consider ischemia, inferior leads Baseline wander in lead(s) I III aVL No significant change since last tracing Confirmed by Kandis Mannan ([id number]) on 01/24/2017 1:07:27 PM       Radiology No results found.  Procedures Procedures (including critical care time)  Medications Ordered in ED Medications  rifaximin (XIFAXAN) tablet 550 mg (550 mg Oral  Given 01/24/17 1425)  lactulose (CHRONULAC) 10 GM/15ML solution 30 g (30 g Oral Given 01/24/17 1425)     Initial Impression / Assessment and Plan / ED Course  I have reviewed the triage vital signs and the nursing notes.  Pertinent labs & imaging results that were available during my care of the patient were reviewed by me and considered in my medical decision making (see chart for details).     atient is 66 year old female with complciated ast medical history followed with Dr. Traci Sermon congestive heart failure clinic. Patient has no complaint at this time. Alert and oriented 4. She is laughing. Patient has normal vital signs. Patient taking by mouth. Seen by PA for Dr. Adriana Reams feel it is safe for her to return home. They will have someone get her house today to help her with her medications.  Final Clinical Impressions(s) / ED Diagnoses   Final diagnoses:  None    New Prescriptions New Prescriptions   No medications on file     Dontee Jaso Randall An, MD 01/24/17 1445

## 2017-01-27 ENCOUNTER — Other Ambulatory Visit (HOSPITAL_COMMUNITY): Payer: Self-pay

## 2017-01-27 ENCOUNTER — Telehealth: Payer: Self-pay | Admitting: Licensed Clinical Social Worker

## 2017-01-27 MED FILL — ATORVASTATIN 40 MG TABLET: 40 | 10 days supply | Qty: 10 | Fill #1

## 2017-01-27 MED FILL — LACTULOSE 10 GM/15 ML SOLN: 10 | 3 days supply | Qty: 240 | Fill #0

## 2017-01-27 NOTE — Progress Notes (Signed)
Paramedicine Encounter    Patient ID: [name], female    DOB: 1951/03/04, 66 y.o.   MRN: [id number]   Patient Care Team: Coralee RudMichael R Duran, PA-C as PCP - General (Cardiology) Luis AbedJeffrey D Katz, MD (Cardiology)  Patient Active Problem List   Diagnosis Date Noted  . RVF (right ventricular failure) (HCC)   . Hepatic encephalopathy (HCC) 01/03/2017  . Systolic CHF, acute on chronic (HCC) 01/02/2017  . Acute on chronic systolic ACC/AHA stage C congestive heart failure (HCC) 12/06/2016  . CAD (coronary artery disease) 12/06/2016  . Suspected sleep apnea 11/21/2016  . Acute on chronic diastolic CHF (congestive heart failure) (HCC) 08/26/2016  . CHF (congestive heart failure) (HCC) 08/26/2016  . CKD (chronic kidney disease) stage 3, GFR 30-59 ml/min 05/02/2016  . Chest tightness   . AKI (acute kidney injury) (HCC) 12/08/2015  . Mass of lower lobe of left lung 07/25/2015  . Primary gout 07/25/2015  . NSTEMI- declined cath 05/29/2015  . DJD (degenerative joint disease), multiple sites 11/07/2014  . Painful diabetic neuropathy (HCC) 10/10/2014  . Hyperkalemia 06/17/2013  . Chronic systolic heart failure (HCC) 04/02/2013  . Hypothyroidism 03/18/2013  . Pulmonary hypertension (HCC)   . Hx of CABG 2012 08/30/2011  . Obesity, morbid-BMI 45   . Type 2 diabetes, uncontrolled, with neuropathy (HCC)   . Ischemic cardiomyopathy-30-35% May 2016 08/13/2011  . Hyperlipidemia LDL goal <70 08/10/2011    Current Outpatient Prescriptions:  .  acetaminophen (TYLENOL) 650 MG CR tablet, Take 650 mg by mouth every 8 (eight) hours as needed (for arthritic pain). , Disp: , Rfl:  .  aspirin 81 MG tablet, Take 1 tablet (81 mg total) by mouth daily., Disp: 30 tablet, Rfl: 9 .  atorvastatin (LIPITOR) 40 MG tablet, Take 1 tablet (40 mg total) by mouth daily., Disp: 90 tablet, Rfl: 3 .  gabapentin (NEURONTIN) 300 MG capsule, Take 300 mg by mouth 3 (three) times daily. , Disp: , Rfl:  .  glipiZIDE (GLUCOTROL)  10 MG tablet, Take 10 mg by mouth 2 (two) times daily., Disp: , Rfl:  .  lactulose (CHRONULAC) 10 GM/15ML solution, Take 45 mLs (30 g total) by mouth 2 (two) times daily., Disp: 240 mL, Rfl: 6 .  Multiple Vitamins-Minerals (CENTRUM SILVER 50+WOMEN PO), Take 1 tablet by mouth every morning., Disp: , Rfl:  .  potassium chloride SA (K-DUR,KLOR-CON) 20 MEQ tablet, Take 2 tablets (40 mEq total) by mouth 2 (two) times daily., Disp: 150 tablet, Rfl: 12 .  rifaximin (XIFAXAN) 550 MG TABS tablet, Take 1 tablet (550 mg total) by mouth 2 (two) times daily., Disp: 60 tablet, Rfl: 12 .  spironolactone (ALDACTONE) 25 MG tablet, Take 1 tablet (25 mg total) by mouth daily., Disp: 30 tablet, Rfl: 5 .  torsemide (DEMADEX) 20 MG tablet, Take 4 tablets (80 mg total) by mouth 2 (two) times daily., Disp: 240 tablet, Rfl: 12 Allergies  Allergen Reactions  . Lisinopril Nausea And Vomiting, Other (See Comments) and Cough    Coughing, throwing up, waking up coughing  . Metformin And Related Anaphylaxis     Social History   Social History  . Marital status: Divorced    Spouse name: N/A  . Number of children: 3  . 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 Smoking status: Former Smoker    Types: Cigarettes    Quit date: 11/11/1978  . Smokeless tobacco: Never Used  Comment: 03/17/2013 "only a social smoker when I did smoke; ever bought any"  . Alcohol use Yes     Comment: OCCASIONAL  . Drug use: No  . Sexual activity: Not Currently    Birth control/ protection: Post-menopausal   Other Topics Concern  . Not on file   Social History Narrative   Lives with her sister and daughter.  Ambulates independently.    Physical Exam  Neck: Normal range of motion.  Pulmonary/Chest: Effort normal and breath sounds normal. No respiratory distress. She has no wheezes. She has no rales.  Musculoskeletal: Normal range of motion. She exhibits edema.  Neurological:  She is alert.  Skin: Skin is warm.        Future Appointments Date Time Provider Department Center  01/28/2017 11:00 AM [facility] None  02/05/2017 1:30 PM [facility] PA/NP [facility] None   BP 126/84 (BP Location: Left Arm, Patient Position: Sitting)   Pulse (!) 56   Resp 16   Wt 214 lb 12.8 oz (97.4 kg)   SpO2 96%   BMI 34.67 kg/m  Weight yesterday- n/a Last visit weight- 210  Christine Knight was seen at home today and reports feeling well. She denies SOB, dizziness, or H/A. New medication was administered per pharmacy direction. Upon looking at her pillbox, it appeared she took morning doses for Monday and Tuesday. Her daughter said that mornings are when Christine Knight is most confused which led to the double dosing. To help prevent this, a strip of tape was placed on each day of the week, so that each time she takes a dose, the tape must be removed (marking which day she is on). Medications were not refilled today since a visit will be done tomorrow for a full pillbox refill. The clinic was contacted about the double dosing of morning medications and lower extremity edema but no call was immediately returned.   Jacqualine Code, EMT 01/27/17  ACTION: Home visit completed Next visit planned for 1 day

## 2017-01-27 NOTE — Telephone Encounter (Signed)
CSW spoke with patient's son Everlean AlstromMaurice [phone] who requested some assistance with obtaining additional services in the home and home equipment [facility](BSC, shower chair). CSW shared that patient was offered Homecare services but had refused at the time. [facility] could be ordered although patient would need to agree to services. CSW shared that paramedicine program involved with patient care and could illict assistance with further discussion with patient about homecare referral. Patient's son will also discuss with patient and return call to CSW. CSW also left message with patient for return call. Lasandra BeechJackie Lissandra Keil, LCSW, CCSW-MCS [phone]

## 2017-01-28 ENCOUNTER — Inpatient Hospital Stay (HOSPITAL_COMMUNITY): Admit: 2017-01-28 | Payer: Medicare Other

## 2017-01-28 ENCOUNTER — Telehealth: Payer: Self-pay | Admitting: *Deleted

## 2017-01-28 ENCOUNTER — Other Ambulatory Visit (HOSPITAL_COMMUNITY): Payer: Self-pay | Admitting: *Deleted

## 2017-01-28 ENCOUNTER — Other Ambulatory Visit (HOSPITAL_COMMUNITY): Payer: Self-pay

## 2017-01-28 ENCOUNTER — Telehealth: Payer: Self-pay | Admitting: Licensed Clinical Social Worker

## 2017-01-28 DIAGNOSIS — I5022 Chronic systolic (congestive) heart failure: Secondary | ICD-10-CM

## 2017-01-28 NOTE — Addendum Note (Signed)
Addended by: Chyrl CivatteBRADLEY, MEGAN G on: 01/28/2017 03:56 PM   Modules accepted: Orders

## 2017-01-28 NOTE — Telephone Encounter (Signed)
Called the patient and left message on vm to call back 01/28/2017

## 2017-01-28 NOTE — Telephone Encounter (Signed)
-----   Message from Traci R Turner, MD sent at 12/26/2016  7:27 PM EST ----- Please let patient know that they have significant sleep apnea and had successful CPAP titration and will be set up with CPAP unit.  Please let DME know that order is in EPIC.  Please set patient up for OV in 10 weeks 

## 2017-01-28 NOTE — Telephone Encounter (Signed)
CSW and [place] param[place] contacted patient at home to discuss referral to [facility] Patient was initially concerned as she is comfortable with [name] filling her med box. She mentioned that her son and daughter are very concerned about her at home and both are encouraging her to follow through with additional services. CSW followed up with clinic staff who will refer to [facility] for service referral. Patient grateful for assistance and willing to accept homecare services. Lasandra BeechJackie Marciel Offenberger, LCSW, CCSW-MCS [phone]

## 2017-01-29 ENCOUNTER — Telehealth: Payer: Self-pay | Admitting: Licensed Clinical Social Worker

## 2017-01-29 ENCOUNTER — Inpatient Hospital Stay (HOSPITAL_COMMUNITY): Admission: RE | Admit: 2017-01-29 | Payer: Medicare Other | Source: Ambulatory Visit

## 2017-01-29 NOTE — Telephone Encounter (Signed)
CSW attempted to contact son by phone to confirm referral made to [facility]. Unable to leave message as no voicemail. Lasandra BeechJackie Montez Stryker, LCSW, CCSW-MCS [phone]

## 2017-01-30 ENCOUNTER — Other Ambulatory Visit (HOSPITAL_COMMUNITY): Payer: Self-pay

## 2017-01-30 ENCOUNTER — Other Ambulatory Visit (HOSPITAL_COMMUNITY): Payer: Self-pay | Admitting: Pharmacist

## 2017-01-30 MED ORDER — LACTULOSE 10 GM/15ML PO SOLN: 30.0000 g | Freq: Two times a day (BID) | ORAL | 6 refills | Status: DC

## 2017-01-30 MED FILL — LACTULOSE 10 GM/15 ML SOLN: 10 | 3 days supply | Qty: 240 | Fill #1

## 2017-01-30 NOTE — Progress Notes (Signed)
Paramedicine Encounter    Patient ID: Christine BabeBarbara Giddings, female    DOB: [birth year], 66 y.o.   MRN: [id number]  Patient Care Team: Coralee RudMichael R Duran, PA-C as PCP - General (Cardiology) Luis AbedJeffrey D Katz, MD (Cardiology)  Patient Active Problem List   Diagnosis Date Noted  . RVF (right ventricular failure) (HCC)   . Hepatic encephalopathy (HCC) 01/03/2017  . Systolic CHF, acute on chronic (HCC) 01/02/2017  . Acute on chronic systolic ACC/AHA stage C congestive heart failure (HCC) 12/06/2016  . CAD (coronary artery disease) 12/06/2016  . Suspected sleep apnea 11/21/2016  . Acute on chronic diastolic CHF (congestive heart failure) (HCC) 08/26/2016  . CHF (congestive heart failure) (HCC) 08/26/2016  . CKD (chronic kidney disease) stage 3, GFR 30-59 ml/min 05/02/2016  . Chest tightness   . AKI (acute kidney injury) (HCC) 12/08/2015  . Mass of lower lobe of left lung 07/25/2015  . Primary gout 07/25/2015  . NSTEMI- declined cath 05/29/2015  . DJD (degenerative joint disease), multiple sites 11/07/2014  . Painful diabetic neuropathy (HCC) 10/10/2014  . Hyperkalemia 06/17/2013  . Chronic systolic heart failure (HCC) 04/02/2013  . Hypothyroidism 03/18/2013  . Pulmonary hypertension (HCC)   . Hx of CABG 2012 08/30/2011  . Obesity, morbid-BMI 45   . Type 2 diabetes, uncontrolled, with neuropathy (HCC)   . Ischemic cardiomyopathy-30-35% May 2016 08/13/2011  . Hyperlipidemia LDL goal <70 08/10/2011    Current Outpatient Prescriptions:  .  acetaminophen (TYLENOL) 650 MG CR tablet, Take 650 mg by mouth every 8 (eight) hours as needed (for arthritic pain). , Disp: , Rfl:  .  aspirin 81 MG tablet, Take 1 tablet (81 mg total) by mouth daily., Disp: 30 tablet, Rfl: 9 .  atorvastatin (LIPITOR) 40 MG tablet, Take 1 tablet (40 mg total) by mouth daily., Disp: 90 tablet, Rfl: 3 .  gabapentin (NEURONTIN) 300 MG capsule, Take 300 mg by mouth 3 (three) times daily. , Disp: , Rfl:  .  glipiZIDE (GLUCOTROL) 10  MG tablet, Take 10 mg by mouth 2 (two) times daily., Disp: , Rfl:  .  lactulose (CHRONULAC) 10 GM/15ML solution, Take 45 mLs (30 g total) by mouth 2 (two) times daily., Disp: 2700 mL, Rfl: 6 .  Multiple Vitamins-Minerals (CENTRUM SILVER 50+WOMEN PO), Take 1 tablet by mouth every morning., Disp: , Rfl:  .  potassium chloride SA (K-DUR,KLOR-CON) 20 MEQ tablet, Take 2 tablets (40 mEq total) by mouth 2 (two) times daily., Disp: 150 tablet, Rfl: 12 .  rifaximin (XIFAXAN) 550 MG TABS tablet, Take 1 tablet (550 mg total) by mouth 2 (two) times daily., Disp: 60 tablet, Rfl: 12 .  torsemide (DEMADEX) 20 MG tablet, Take 4 tablets (80 mg total) by mouth 2 (two) times daily., Disp: 240 tablet, Rfl: 12 .  spironolactone (ALDACTONE) 25 MG tablet, Take 1 tablet (25 mg total) by mouth daily., Disp: 30 tablet, Rfl: 5 Allergies  Allergen Reactions  . Lisinopril Nausea And Vomiting, Other (See Comments) and Cough    Coughing, throwing up, waking up coughing  . Metformin And Related Anaphylaxis     Social History   Social History  . Marital status: Divorced    Spouse name: N/A  . Number of children: 3  . 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 Smoking status: Former Smoker    Types: Cigarettes    Quit date: 11/11/1978  . Smokeless tobacco: Never Used  Comment: 03/17/2013 "only a social smoker when I did smoke; ever bought any"  . Alcohol use Yes     Comment: OCCASIONAL  . Drug use: No  . Sexual activity: Not Currently    Birth control/ protection: Post-menopausal   Other Topics Concern  . Not on file   Social History Narrative   Lives with her sister and daughter.  Ambulates independently.    Physical Exam  Eyes: Pupils are equal, round, and reactive to light.  Pulmonary/Chest: No respiratory distress. She has no wheezes. She has no rales.  Abdominal: She exhibits no distension. There is no tenderness. There is no guarding.   Musculoskeletal: She exhibits edema.  Skin: She is diaphoretic.        Future Appointments Date Time Provider Department Center  02/05/2017 1:30 PM [facility] PA/NP [facility] None   ATF pt CAO x4 sitting in her bedroom on the bed c/o "not feeling great". Pt stated that she think that her "sugar is low and would like to take glipizide that she did before.Marland Kitchenas needed".  I advised pt that I would put one dose in the pm slot of her pill box.  I checked pt's CBG @ 60 and she becoming diaphoretic. Pt was given chocolate pudding, oatmeal and silk milk to bring her cbg up.pt stated that the same thing happened last night.  Pt is missing the spriolactone prescription bottle, therefore I called another dose was called in.  pts daughter stated that she will pick it up after work. At Pt stated that she feels "a lot better now that she ate more food". Pt denies sob, chest pain, dizziness and lightheadiness.  rx bottles and pill box verified.   BP 100/60 (BP Location: Right Arm, Patient Position: Sitting, Cuff Size: Large)   Pulse (!) 54   Resp 16   Wt 219 lb (99.3 kg)   SpO2 96%   BMI 35.35 kg/m   Weight yesterday-214 Last visit weight-214    ACTION: Home visit completed

## 2017-01-31 ENCOUNTER — Encounter: Payer: Self-pay | Admitting: *Deleted

## 2017-01-31 ENCOUNTER — Telehealth: Payer: Self-pay | Admitting: *Deleted

## 2017-01-31 NOTE — Telephone Encounter (Signed)
-----   Message from Traci R Turner, MD sent at 12/26/2016  7:27 PM EST ----- Please let patient know that they have significant sleep apnea and had successful CPAP titration and will be set up with CPAP unit.  Please let DME know that order is in EPIC.  Please set patient up for OV in 10 weeks 

## 2017-01-31 NOTE — Telephone Encounter (Signed)
Called the patient to give her the sleep study results, left a message to call back and a  Second no contact letter has been sent.  01/31/2017

## 2017-02-03 ENCOUNTER — Telehealth: Payer: Self-pay | Admitting: *Deleted

## 2017-02-03 NOTE — Telephone Encounter (Signed)
Called the patient and left a message on [phone] to call back. Certified letter sent  02/03/17.

## 2017-02-04 DIAGNOSIS — I130 Hypertensive heart and chronic kidney disease with heart failure and stage 1 through stage 4 chronic kidney disease, or unspecified chronic kidney disease: Secondary | ICD-10-CM | POA: Diagnosis not present

## 2017-02-05 ENCOUNTER — Encounter (HOSPITAL_COMMUNITY): Payer: Self-pay

## 2017-02-05 ENCOUNTER — Ambulatory Visit (HOSPITAL_COMMUNITY)
Admission: RE | Admit: 2017-02-05 | Discharge: 2017-02-05 | Disposition: A | Discharge status: Home / Self Care | Payer: Medicare Other | Source: Ambulatory Visit | Attending: Internal Medicine | Admitting: Internal Medicine

## 2017-02-05 ENCOUNTER — Other Ambulatory Visit (HOSPITAL_COMMUNITY): Payer: Self-pay

## 2017-02-05 VITALS — BP 116/84 | HR 82 | Wt 221.1 lb

## 2017-02-05 DIAGNOSIS — N183 Chronic kidney disease, stage 3 unspecified: Secondary | ICD-10-CM

## 2017-02-05 DIAGNOSIS — Z7984 Long term (current) use of oral hypoglycemic drugs: Secondary | ICD-10-CM | POA: Insufficient documentation

## 2017-02-05 DIAGNOSIS — I130 Hypertensive heart and chronic kidney disease with heart failure and stage 1 through stage 4 chronic kidney disease, or unspecified chronic kidney disease: Secondary | ICD-10-CM | POA: Insufficient documentation

## 2017-02-05 DIAGNOSIS — Z951 Presence of aortocoronary bypass graft: Secondary | ICD-10-CM | POA: Diagnosis not present

## 2017-02-05 DIAGNOSIS — R29818 Other symptoms and signs involving the nervous system: Secondary | ICD-10-CM

## 2017-02-05 DIAGNOSIS — E1122 Type 2 diabetes mellitus with diabetic chronic kidney disease: Secondary | ICD-10-CM | POA: Insufficient documentation

## 2017-02-05 DIAGNOSIS — E875 Hyperkalemia: Secondary | ICD-10-CM | POA: Diagnosis not present

## 2017-02-05 DIAGNOSIS — I5022 Chronic systolic (congestive) heart failure: Secondary | ICD-10-CM | POA: Diagnosis not present

## 2017-02-05 DIAGNOSIS — I255 Ischemic cardiomyopathy: Secondary | ICD-10-CM

## 2017-02-05 DIAGNOSIS — I272 Pulmonary hypertension, unspecified: Secondary | ICD-10-CM | POA: Insufficient documentation

## 2017-02-05 DIAGNOSIS — M109 Gout, unspecified: Secondary | ICD-10-CM | POA: Diagnosis not present

## 2017-02-05 DIAGNOSIS — I252 Old myocardial infarction: Secondary | ICD-10-CM | POA: Insufficient documentation

## 2017-02-05 DIAGNOSIS — G4730 Sleep apnea, unspecified: Secondary | ICD-10-CM

## 2017-02-05 DIAGNOSIS — G4733 Obstructive sleep apnea (adult) (pediatric): Secondary | ICD-10-CM | POA: Diagnosis not present

## 2017-02-05 DIAGNOSIS — I2510 Atherosclerotic heart disease of native coronary artery without angina pectoris: Secondary | ICD-10-CM | POA: Diagnosis not present

## 2017-02-05 DIAGNOSIS — Z79899 Other long term (current) drug therapy: Secondary | ICD-10-CM | POA: Diagnosis not present

## 2017-02-05 DIAGNOSIS — Z7982 Long term (current) use of aspirin: Secondary | ICD-10-CM | POA: Insufficient documentation

## 2017-02-05 LAB — BASIC METABOLIC PANEL
Anion gap: 9 (ref 5–15)
BUN: 66 mg/dL — AB (ref 6–20)
CO2: 22 mmol/L (ref 22–32)
CREATININE: 1.53 mg/dL — AB (ref 0.44–1.00)
Calcium: 9.4 mg/dL (ref 8.9–10.3)
Chloride: 104 mmol/L (ref 101–111)
GFR calc Af Amer: 40 mL/min — ABNORMAL LOW (ref >60)
GFR calc non Af Amer: 35 mL/min — ABNORMAL LOW (ref >60)
Glucose, Bld: 73 mg/dL (ref 65–99)
Potassium: 4.7 mmol/L (ref 3.5–5.1)
Sodium: 135 mmol/L (ref 135–145)

## 2017-02-05 MED ORDER — POTASSIUM CHLORIDE CRYS ER 20 MEQ PO TBCR: 20.0000 meq | EXTENDED_RELEASE_TABLET | Freq: Every day | ORAL | 6 refills | Status: DC

## 2017-02-05 MED FILL — ATORVASTATIN 40 MG TABLET: 40 | 90 days supply | Qty: 90 | Fill #2

## 2017-02-05 MED FILL — GABAPENTIN 300 MG CAPSULE: 300 | 30 days supply | Qty: 90 | Fill #0

## 2017-02-05 NOTE — Progress Notes (Signed)
Paramedicine Encounter    Patient ID: Christine Knight, female    DOB: 17-Dec-1950, 66 y.o.   MRN: [id number]  Patient Care Team: Coralee Rud, PA-C as PCP - General (Cardiology) Luis Abed, MD (Cardiology)  Patient Active Problem List   Diagnosis Date Noted  . RVF (right ventricular failure) (HCC)   . Hepatic encephalopathy (HCC) 01/03/2017  . Systolic CHF, acute on chronic (HCC) 01/02/2017  . Acute on chronic systolic ACC/AHA stage C congestive heart failure (HCC) 12/06/2016  . CAD (coronary artery disease) 12/06/2016  . Suspected sleep apnea 11/21/2016  . Acute on chronic diastolic CHF (congestive heart failure) (HCC) 08/26/2016  . CHF (congestive heart failure) (HCC) 08/26/2016  . CKD (chronic kidney disease) stage 3, GFR 30-59 ml/min 05/02/2016  . Chest tightness   . AKI (acute kidney injury) (HCC) 12/08/2015  . Mass of lower lobe of left lung 07/25/2015  . Primary gout 07/25/2015  . NSTEMI- declined cath 05/29/2015  . DJD (degenerative joint disease), multiple sites 11/07/2014  . Painful diabetic neuropathy (HCC) 10/10/2014  . Hyperkalemia 06/17/2013  . Chronic systolic heart failure (HCC) 04/02/2013  . Hypothyroidism 03/18/2013  . Pulmonary hypertension (HCC)   . Hx of CABG 2012 08/30/2011  . Obesity, morbid-BMI 45   . Type 2 diabetes, uncontrolled, with neuropathy (HCC)   . Ischemic cardiomyopathy-30-35% May 2016 08/13/2011  . Hyperlipidemia LDL goal <70 08/10/2011    Current Outpatient Prescriptions:  .  acetaminophen (TYLENOL) 650 MG CR tablet, Take 650 mg by mouth every 8 (eight) hours as needed (for arthritic pain). , Disp: , Rfl:  .  aspirin 81 MG tablet, Take 1 tablet (81 mg total) by mouth daily., Disp: 30 tablet, Rfl: 9 .  atorvastatin (LIPITOR) 40 MG tablet, Take 1 tablet (40 mg total) by mouth daily., Disp: 90 tablet, Rfl: 3 .  gabapentin (NEURONTIN) 300 MG capsule, Take 300 mg by mouth 3 (three) times daily. , Disp: , Rfl:  .  glipiZIDE (GLUCOTROL) 10  MG tablet, Take 10 mg by mouth 2 (two) times daily., Disp: , Rfl:  .  lactulose (CHRONULAC) 10 GM/15ML solution, Take 45 mLs (30 g total) by mouth 2 (two) times daily., Disp: 2700 mL, Rfl: 6 .  Multiple Vitamins-Minerals (CENTRUM SILVER 50+WOMEN PO), Take 1 tablet by mouth every morning., Disp: , Rfl:  .  potassium chloride SA (K-DUR,KLOR-CON) 20 MEQ tablet, Take 2 tablets (40 mEq total) by mouth 2 (two) times daily., Disp: 150 tablet, Rfl: 12 .  rifaximin (XIFAXAN) 550 MG TABS tablet, Take 1 tablet (550 mg total) by mouth 2 (two) times daily., Disp: 60 tablet, Rfl: 12 .  spironolactone (ALDACTONE) 25 MG tablet, Take 1 tablet (25 mg total) by mouth daily., Disp: 30 tablet, Rfl: 5 .  torsemide (DEMADEX) 20 MG tablet, Take 4 tablets (80 mg total) by mouth 2 (two) times daily., Disp: 240 tablet, Rfl: 12 Allergies  Allergen Reactions  . Lisinopril Nausea And Vomiting, Other (See Comments) and Cough    Coughing, throwing up, waking up coughing  . Metformin And Related Anaphylaxis     Social History   Social History  . Marital status: Divorced    Spouse name: N/A  . Number of children: 3  . 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 Smoking status: Former Smoker    Types: Cigarettes    Quit date: 11/11/1978  . Smokeless tobacco: Never Used  Comment: 03/17/2013 "only a social smoker when I did smoke; ever bought any"  . Alcohol use Yes     Comment: OCCASIONAL  . Drug use: No  . Sexual activity: Not Currently    Birth control/ protection: Post-menopausal   Other Topics Concern  . Not on file   Social History Narrative   Lives with her sister and daughter.  Ambulates independently.    Physical Exam  Eyes: Pupils are equal, round, and reactive to light.  Pulmonary/Chest: No respiratory distress. She has no wheezes. She has no rales.  Abdominal: She exhibits no distension. There is no tenderness. There is no rebound and no  guarding.  Musculoskeletal: She exhibits no edema.  Skin: Skin is warm and dry. She is not diaphoretic.        Future Appointments Date Time Provider Department Center  02/05/2017 1:30 PM [facility] PA/NP [facility] None   Clinic visit ATF pt CAO x4 in the clinic with no complaints today.  Pain in right arm and right leg, pt think that the pain is due to arthritis. Pt has dizziness in the am, therefore she was advised to take her time standing up.  Pt's aide comes by on Monday and Friday.    Erin Sleep study showed that pt has sleep apnea and need a cpap machine. No medication re-vision during this appointment   Called in: atarvastin (filled until tues) Gabapentin ( until sun night)  There were no vitals taken for this visit.  Weight yesterday-cant remember   ACTION: Home visit completed

## 2017-02-05 NOTE — Patient Instructions (Addendum)
Routine lab work today. Will notify you of abnormal results, otherwise no news is good news!  DECREASE Potassium to 20 meq (1 tab) once daily.  Follow up 2 weeks with [name] NP-C.  Do the following things EVERYDAY: 1) Weigh yourself in the morning before breakfast. Write it down and keep it in a log. 2) Take your medicines as prescribed 3) Eat low salt foods-Limit salt (sodium) to 2000 mg per day.  4) Stay as active as you can everyday 5) Limit all fluids for the day to less than 2 liters

## 2017-02-05 NOTE — Progress Notes (Signed)
Advanced Heart Failure Medication Review by a Pharmacist  Does the patient  feel that his/her medications are working for him/her?  yes  Has the patient been experiencing any side effects to the medications prescribed?  no  Does the patient measure his/her own blood pressure or blood glucose at home?  yes   Does the patient have any problems obtaining medications due to transportation or finances?   No - test strips are expensive, she will f/u with her PCP about it  Understanding of regimen: good Understanding of indications: good Potential of compliance: good Patient understands to avoid NSAIDs. Patient understands to avoid decongestants.  Issues to address at subsequent visits: None   Pharmacist comments: [name] is a pleasant 66 yo AA female presenting to [facility] with her daughter and paramedic, Christine Knight. She brought her medication bottles, and noted no discrepancies. Reports good compliance, and trying her hardest to take medications as prescribed since last discharge. C/o dizziness upon changing positions - recommended to get up slowly to allow equilibration. C/o glipizide causing hypoglycemia, she is taking prn right now - has PCP visit this week and will f/u with them. No other questions or concerns.   Allie BossierApryl Ryon Layton, PharmD PGY1 Pharmacy Resident [phone] (Pager) 02/05/2017 1:32 PM  Time with patient: 10 min Preparation and documentation time: 2 min Total time: 12 min

## 2017-02-05 NOTE — Progress Notes (Signed)
Patient ID: Christine Knight, female   DOB: January 29, 1951, 66 y.o.   MRN: [id number]     Advanced Heart Failure Clinic Note   PCP: Arnette FeltsMike Duran  HF: Dr. Gala RomneyBensimhon   HPI: Ms.Stampley is a 66 y.o. female w/ PMHx significant for morbid obesity, systolic HF due to ICM (10/12 EF 25%; 07/2012 EF 35%; 03/18/13 EF 20-25%), CAD with h/o NSTEMI s/p CABG Oct 2012, pulmonary hypertension (PA peak 72mmHg), and poorly controlled DM 2.  [facility] for dyspnea, leg edema, and weight gain.  Lasix gtt used and she diuresed 15 liters. Diuresed 29 pounds.  ECHO- EF 20-25%.  Discharge weight 264 pounds.    Admitted 12/08/2015 - 12/28/2015 with worsening DOE and cough. Stated she had a 40 lb weight since 07/2015. She was diuresed aggressively with lasix gtt at 30 mg/hr and metolazone 5 mg BID. Diamox eventually added. Diuretics held temporarily towards end of stay with worsening Creatinine. Overall she diuresed >40 L and was down 70 lbs from admission weight. Pt had R/LHC with preserved CO and mild/mod Pulm HTN. Severe native CAD with patent CABG grafts. Discharge weight 237 lbs.   Admitted 6/1 -04/26/2016 with marked volume overload, low output hf, cardiorenal syndrome. Diuresed with high dose lasix 160 mg three times a day. Diuresed over 50 pounds. Transitioned to torsemide 80 mg daily. BB and losartan stopped. Discharge weight 222 pounds   Admitted 10/15 - 09/08/16 with marked volume overload. PT diuresed with high dose IV lasix, metolazone, as well as Milrinone to augment diuresis. Diamox also added.  Pt diuresed 35 L and lost 44 lbs. Pt transitioned back to po torsemide, and had to be adjusted further with AKI.  Discharge weight 224 lbs.  Admitted  12/6 through 11/02/16 with marked volume overload. Diuresed with IV lasix, metolazone, and milrinone. Overall diuresed 41 pounds. Discharged on torsemide 60 mg twice a day. Discharge weight was 211 pounds.   Admitted 2/22 through 01/17/2017 with marked volume  overload. Diuresed with lasix drip and milrinone 0.25 mcg. Ammonia was high so xifaxan started. Discharged on torsemide 80 mg twice a day. Discharge weight was 210 pounds.   Seen in the ED on 01/24/17 with altered mental status, resolved in the ED. Ammonia was 79. She was sent home, not admitted.   Today she returns for HF follow up with her 2 daughters. . Says she feels fairly well, does get SOB with activity, can walk around the house without SOB, but cannot walk through a store. Sleeps with one pillow, denies chest pain and PND. Taking her medications, followed by paramedicine. Weights at home 217 lbs (up 7 pounds from discharge weight). Drinking more than 2L a day, eating low salt diet. Had sleep study waiting of CPAP  ROS: All systems negative except as above, in PMH, and in Problem list.   Labs 2/17 K 4.3, Cr 1.56 Labs 04/26/2016: K 3.1 Creatinine 1.78 K 3.1 Labs 06/05/2016: K 4.3 Creatinine 1.25 Labas 06/13/2016: K 4.0 Creatinine 1.49  Labs 07/31/2016: K 4.5 Creatinine 2.27  Labs  08/05/2016: K 5.0 Creatinine 3.18  11/02/2017: K 3.6 Creatinine 1.59  01/24/2017: K 5.3 Creatinine 2.3   03/18/13 ECHO EF 20-25% 06/17/2013 ECHO EF 35% RV ok (read formally after visit 35-40%) 03/15/2015 ECHO EF 30-35%, grade 3 DD, Moderate TR, Severely reduced RV, PA peak pressure 81 mm Hg 12/09/2015 ECHO EF 15-20%, Decreased LV diastolic compliance, RV mod reduced, PA peak 57 mmHg, Severe TR  04/14/16 Echo 15-20%  10/2016: EF Peak PA pressure 89 mm hg  EF 35-40% RV dysfunction.    [facility] 12/13/15 Hemodynamics RA 25/25 (23) RV pressure 92/13  RV EDP 26 PA pressure 86/34 (56) PW mean 37 AO Pressure 114/72 (88) LV Pressure 101/12 (26) LV EDP 26 PVR 3.24 Fick CO/CI 5.85 / 2.41L/min   Prox LAD lesion, 100% stenosed. Prox Cx lesion, 99% stenosed. Mid RCA lesion, 80% stenosed. Dist RCA lesion, 100% stenosed. Patent grafts    Past Medical History:  Diagnosis Date  . CAD (coronary artery disease) 08/2011    NSTEMI with subsequent CABG 08/13/2011 with LIMA-LAD, SVG-diagonal, SVG-OM, SVG-PDA  . Carpal tunnel syndrome on both sides    "post OHS in 08/2011; resolved now" (03/17/2013)  . CHF (congestive heart failure) (HCC)   . Chronic systolic heart failure (HCC)    Chronic systolic CHF  . Diabetes mellitus type 2 in obese (HCC) 08/10/11  . Dyspnea   . Ejection fraction < 50%    EF 20%, October, 2012  //  EF 25% December, 2012  //   EF 35%, echo, September, 2013  . Hyperlipidemia 08/10/11  . Hypertension   . Ischemic cardiomyopathy 08/13/2011   EF 20% in 08/2011, still 25% 10/21/2011  . Mitral regurgitation    Mild by echo 10/21/11  . Myocardial infarction 08/2011  . Obesity, morbid (HCC)   . Orthopnoea    "progressively worse over last 3 wks" (03/17/2013)  . Pleural effusion    Requiring L thoracentesis 09/02/11  . Pulmonary hypertension    Echo, September, 2013, 72 mmHg.    Current Outpatient Prescriptions  Medication Sig Dispense Refill  . acetaminophen (TYLENOL) 650 MG CR tablet Take 650 mg by mouth every 8 (eight) hours as needed (for arthritic pain).     Marland Kitchen aspirin 81 MG tablet Take 1 tablet (81 mg total) by mouth daily. 30 tablet 9  . atorvastatin (LIPITOR) 40 MG tablet Take 1 tablet (40 mg total) by mouth daily. 90 tablet 3  . gabapentin (NEURONTIN) 300 MG capsule Take 300 mg by mouth 3 (three) times daily.     Marland Kitchen glipiZIDE (GLUCOTROL) 10 MG tablet Take 10 mg by mouth 2 (two) times daily.    Marland Kitchen lactulose (CHRONULAC) 10 GM/15ML solution Take 45 mLs (30 g total) by mouth 2 (two) times daily. 2700 mL 6  . Multiple Vitamins-Minerals (CENTRUM SILVER 50+WOMEN PO) Take 1 tablet by mouth every morning.    . potassium chloride SA (K-DUR,KLOR-CON) 20 MEQ tablet Take 2 tablets (40 mEq total) by mouth 2 (two) times daily. 150 tablet 12  . rifaximin (XIFAXAN) 550 MG TABS tablet Take 1 tablet (550 mg total) by mouth 2 (two) times daily. 60 tablet 12  . spironolactone (ALDACTONE) 25 MG tablet Take 1  tablet (25 mg total) by mouth daily. 30 tablet 5  . torsemide (DEMADEX) 20 MG tablet Take 4 tablets (80 mg total) by mouth 2 (two) times daily. 240 tablet 12   No current facility-administered medications for this encounter.      PHYSICAL EXAM: Vitals:   02/05/17 1327  BP: 116/84  Pulse: 82  SpO2: 97%  Weight: 221 lb 2 oz (100.3 kg)   Wt Readings from Last 3 Encounters:  02/05/17 217 lb (98.4 kg)  02/05/17 221 lb 2 oz (100.3 kg)  01/30/17 214 lb (97.1 kg)     General: Obese female in NAD, daughters present at visit.  HEENT: normal, atraumatic.  Neck: JVP to jaw. Carotids 2+ bilat; no bruits.  No thyromegaly or nodule noted.  Cor: PMI non displaced, regular rate and rhythm. No murmurs, rubs or gallops.  Lungs: clear in upper lobes, diminished in bases.  Abdomen: Obese, soft, non-tender.  ND, no HSM. No bruits or masses. + bowel sounds. Extremities: no cyanosis, clubbing, rash. Trace pedal edema  Neuro: alert & orientedx3, cranial nerves grossly intact.Moves all 4 extremities without difficulty.   ASSESSMENT/PLAN 1. Acute on Chronic systolic HF due to ICM EF 15-20% Echo 04/14/2016.  - NYHA class III. Volume status elevated but stable functionally. - Continue torsemide 80 mg twice a day.  - No beta blocker with low output.  - Continue Spiro 25mg  daily.  - No ACE due to allergy - No Entresto due to AKI.  - No dig with CKD.  - Intolerant bidil with difficulty with headaches, dizziness and hypotension.  - Reinforced importance of fluid restrictions.  2. CAD s/p NSTEMI and CABG 10/12  - Denies chest pain.  - Continue atorvastatin 40 mg daily.  - Severe native CAD with patent grafts on [facility] 12/13/15. Continue ASA 81 mg.  3. Morbid obesity - Encouraged to limit portions and increase activity as tolerated.  4. Pulmonary HTN - mild to mod RHC 12/13/15. PVR 3.24.  - No change.  5. CKD stage III-V - Creatinine stable at 2.3 -Check BMET today  - Advised her to avoid NSAIDs.  6. DM2    - Per PCP. 7. Obstructive sleep apnea: - had sleep study 12/25/16 and has severe obstructive sleep apnea. Will send message to Dr. Mayford Knife for her office to set her up with a CPAP.  - Needs CPAP machine, talked with her the importance of wearing CPAP.  8. Gout - Per PCP  9. Hyperkalemia  - Labs from 2 weeks ago, K is 5.3, repeat BMET today.  - Reduce KCl to 20 mEq daily. Repeat BMET at next visit.  -Discussed limiting high potassium food.  10. Advanced directives - Completed GOLD DNR form last visit.  - Will hold off on referring to Hospice now, but discussed with family that it is likely a possibility.    Follow up in 2 weeks.  Greater than 50% of the (total minutes 25) visit spent in counseling/coordination of care regarding regarding heart failure, sleep apnea, and high salt diet.   Amy Filbert Schilder, NP-C  1:47 PM

## 2017-02-07 ENCOUNTER — Other Ambulatory Visit (HOSPITAL_COMMUNITY): Payer: Self-pay

## 2017-02-07 NOTE — Progress Notes (Signed)
Paramedicine Encounter    Patient ID: Christine Knight, female    DOB: April 23, 1951, 66 y.o.   MRN: [id number]   Patient Care Team: Coralee Rud, PA-C as PCP - General (Cardiology) Luis Abed, MD (Cardiology)  Patient Active Problem List   Diagnosis Date Noted  . RVF (right ventricular failure) (HCC)   . Hepatic encephalopathy (HCC) 01/03/2017  . Systolic CHF, acute on chronic (HCC) 01/02/2017  . Acute on chronic systolic ACC/AHA stage C congestive heart failure (HCC) 12/06/2016  . CAD (coronary artery disease) 12/06/2016  . Suspected sleep apnea 11/21/2016  . Acute on chronic diastolic CHF (congestive heart failure) (HCC) 08/26/2016  . CHF (congestive heart failure) (HCC) 08/26/2016  . CKD (chronic kidney disease) stage 3, GFR 30-59 ml/min 05/02/2016  . Chest tightness   . AKI (acute kidney injury) (HCC) 12/08/2015  . Mass of lower lobe of left lung 07/25/2015  . Primary gout 07/25/2015  . NSTEMI- declined cath 05/29/2015  . DJD (degenerative joint disease), multiple sites 11/07/2014  . Painful diabetic neuropathy (HCC) 10/10/2014  . Hyperkalemia 06/17/2013  . Chronic systolic heart failure (HCC) 04/02/2013  . Hypothyroidism 03/18/2013  . Pulmonary hypertension (HCC)   . Hx of CABG 2012 08/30/2011  . Obesity, morbid-BMI 45   . Type 2 diabetes, uncontrolled, with neuropathy (HCC)   . Ischemic cardiomyopathy-30-35% May 2016 08/13/2011  . Hyperlipidemia LDL goal <70 08/10/2011    Current Outpatient Prescriptions:  .  acetaminophen (TYLENOL) 650 MG CR tablet, Take 650 mg by mouth every 8 (eight) hours as needed (for arthritic pain). , Disp: , Rfl:  .  aspirin 81 MG tablet, Take 1 tablet (81 mg total) by mouth daily., Disp: 30 tablet, Rfl: 9 .  atorvastatin (LIPITOR) 40 MG tablet, Take 1 tablet (40 mg total) by mouth daily., Disp: 90 tablet, Rfl: 3 .  gabapentin (NEURONTIN) 300 MG capsule, Take 300 mg by mouth 3 (three) times daily. , Disp: , Rfl:  .  lactulose (CHRONULAC)  10 GM/15ML solution, Take 45 mLs (30 g total) by mouth 2 (two) times daily., Disp: 2700 mL, Rfl: 6 .  Multiple Vitamins-Minerals (CENTRUM SILVER 50+WOMEN PO), Take 1 tablet by mouth every morning., Disp: , Rfl:  .  potassium chloride SA (K-DUR,KLOR-CON) 20 MEQ tablet, Take 1 tablet (20 mEq total) by mouth daily., Disp: 30 tablet, Rfl: 6 .  rifaximin (XIFAXAN) 550 MG TABS tablet, Take 1 tablet (550 mg total) by mouth 2 (two) times daily., Disp: 60 tablet, Rfl: 12 .  spironolactone (ALDACTONE) 25 MG tablet, Take 1 tablet (25 mg total) by mouth daily., Disp: 30 tablet, Rfl: 5 .  torsemide (DEMADEX) 20 MG tablet, Take 4 tablets (80 mg total) by mouth 2 (two) times daily., Disp: 240 tablet, Rfl: 12 .  glipiZIDE (GLUCOTROL) 10 MG tablet, Take 10 mg by mouth 2 (two) times daily., Disp: , Rfl:  Allergies  Allergen Reactions  . Lisinopril Nausea And Vomiting, Other (See Comments) and Cough    Coughing, throwing up, waking up coughing  . Metformin And Related Anaphylaxis     Social History   Social History  . Marital status: Divorced    Spouse name: N/A  . Number of children: 3  . 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 Smoking status: Former Smoker    Types: Cigarettes    Quit date: 11/11/1978  . Smokeless tobacco: Never Used     Comment:  03/17/2013 "only a social smoker when I did smoke; ever bought any"  . Alcohol use Yes     Comment: OCCASIONAL  . Drug use: No  . Sexual activity: Not Currently    Birth control/ protection: Post-menopausal   Other Topics Concern  . Not on file   Social History Narrative   Lives with her sister and daughter.  Ambulates independently.    Physical Exam  Pulmonary/Chest: No respiratory distress. She has no wheezes. She has no rales.  Musculoskeletal: She exhibits edema.  Skin: Skin is warm and dry. She is not diaphoretic.        Future Appointments Date Time Provider Department Center   02/18/2017 1:30 PM [facility] PA/NP [facility] None   Re-visit  pill box revised.  BP 112/76 (BP Location: Left Arm, Patient Position: Sitting)   Pulse 60   Wt 217 lb (98.4 kg)   SpO2 99%   BMI 35.02 kg/m   Weight yesterday- 217 Last visit weight-215    ACTION: Home visit completed

## 2017-02-12 ENCOUNTER — Other Ambulatory Visit (HOSPITAL_COMMUNITY): Payer: Self-pay

## 2017-02-12 ENCOUNTER — Telehealth (HOSPITAL_COMMUNITY): Payer: Self-pay | Admitting: *Deleted

## 2017-02-12 NOTE — Telephone Encounter (Signed)
[name] called to report pt's wt is up 6 lbs from last week,no sob, edema is stable from last week.  Per Tonye Becket, NP pt needs to take extra 20 mg of Torsemide today, Geraldine Contras is aware and will advise pt, she again will discuss fluid restriction with pt as well and continue to monitor her

## 2017-02-12 NOTE — Progress Notes (Signed)
Paramedicine Encounter    Patient ID: Christine Knight, female    DOB: 03/05/1951, 66 y.o.   MRN: [id number]   Patient Care Team: Coralee Rud, PA-C as PCP - General (Cardiology) Luis Abed, MD (Cardiology)  Patient Active Problem List   Diagnosis Date Noted  . RVF (right ventricular failure) (HCC)   . Hepatic encephalopathy (HCC) 01/03/2017  . Systolic CHF, acute on chronic (HCC) 01/02/2017  . Acute on chronic systolic ACC/AHA stage C congestive heart failure (HCC) 12/06/2016  . CAD (coronary artery disease) 12/06/2016  . Suspected sleep apnea 11/21/2016  . Acute on chronic diastolic CHF (congestive heart failure) (HCC) 08/26/2016  . CHF (congestive heart failure) (HCC) 08/26/2016  . CKD (chronic kidney disease) stage 3, GFR 30-59 ml/min 05/02/2016  . Chest tightness   . AKI (acute kidney injury) (HCC) 12/08/2015  . Mass of lower lobe of left lung 07/25/2015  . Primary gout 07/25/2015  . NSTEMI- declined cath 05/29/2015  . DJD (degenerative joint disease), multiple sites 11/07/2014  . Painful diabetic neuropathy (HCC) 10/10/2014  . Hyperkalemia 06/17/2013  . Chronic systolic heart failure (HCC) 04/02/2013  . Hypothyroidism 03/18/2013  . Pulmonary hypertension (HCC)   . Hx of CABG 2012 08/30/2011  . Obesity, morbid-BMI 45   . Type 2 diabetes, uncontrolled, with neuropathy (HCC)   . Ischemic cardiomyopathy-30-35% May 2016 08/13/2011  . Hyperlipidemia LDL goal <70 08/10/2011    Current Outpatient Prescriptions:  .  acetaminophen (TYLENOL) 650 MG CR tablet, Take 650 mg by mouth every 8 (eight) hours as needed (for arthritic pain). , Disp: , Rfl:  .  aspirin 81 MG tablet, Take 1 tablet (81 mg total) by mouth daily., Disp: 30 tablet, Rfl: 9 .  atorvastatin (LIPITOR) 40 MG tablet, Take 1 tablet (40 mg total) by mouth daily., Disp: 90 tablet, Rfl: 3 .  gabapentin (NEURONTIN) 300 MG capsule, Take 300 mg by mouth 3 (three) times daily. , Disp: , Rfl:  .  Multiple  Vitamins-Minerals (CENTRUM SILVER 50+WOMEN PO), Take 1 tablet by mouth every morning., Disp: , Rfl:  .  potassium chloride SA (K-DUR,KLOR-CON) 20 MEQ tablet, Take 1 tablet (20 mEq total) by mouth daily., Disp: 30 tablet, Rfl: 6 .  rifaximin (XIFAXAN) 550 MG TABS tablet, Take 1 tablet (550 mg total) by mouth 2 (two) times daily., Disp: 60 tablet, Rfl: 12 .  spironolactone (ALDACTONE) 25 MG tablet, Take 1 tablet (25 mg total) by mouth daily., Disp: 30 tablet, Rfl: 5 .  torsemide (DEMADEX) 20 MG tablet, Take 4 tablets (80 mg total) by mouth 2 (two) times daily., Disp: 240 tablet, Rfl: 12 .  glipiZIDE (GLUCOTROL) 10 MG tablet, Take 10 mg by mouth 2 (two) times daily., Disp: , Rfl:  .  lactulose (CHRONULAC) 10 GM/15ML solution, Take 45 mLs (30 g total) by mouth 2 (two) times daily., Disp: 2700 mL, Rfl: 6 Allergies  Allergen Reactions  . Lisinopril Nausea And Vomiting, Other (See Comments) and Cough    Coughing, throwing up, waking up coughing  . Metformin And Related Anaphylaxis     Social History   Social History  . Marital status: Divorced    Spouse name: N/A  . Number of children: 3  . 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 Smoking status: Former Smoker    Types: Cigarettes    Quit date: 11/11/1978  . Smokeless tobacco: Never Used     Comment:  03/17/2013 "only a social smoker when I did smoke; ever bought any"  . Alcohol use Yes     Comment: OCCASIONAL  . Drug use: No  . Sexual activity: Not Currently    Birth control/ protection: Post-menopausal   Other Topics Concern  . Not on file   Social History Narrative   Lives with her sister and daughter.  Ambulates independently.    Physical Exam  Pulmonary/Chest: No respiratory distress. She has no wheezes. She has no rales.  Abdominal: She exhibits no distension. There is no tenderness.  Musculoskeletal: She exhibits no edema.  Skin: Skin is warm and dry. She is not  diaphoretic.        Future Appointments Date Time Provider Department Center  02/18/2017 1:30 PM [facility] PA/NP [facility] None   ATF pt CAO x4 with no complaints. Pt stated that she kept to her low sodium diet.  Pt admits that she had several bottles of water and has not been keeping track of her fluid intake.  Pt denies sob, dizziness, chest pain and headaches.  Pt stated that she ate foods low in sodium throughout the weekend.pt denies pain in her extremities although she has edema up to her knees. Pt has taken her medications and hasn't missed any doses.  rx bottles and pill box verified.     Heart clinic verified of pts weight gain and Heather advised pt to take an extra torsemide today.    Called:  [id number] xifaxan  BP 110/66 (BP Location: Left Arm, Patient Position: Sitting, Cuff Size: Normal)   Pulse (!) 56   Resp 16   Wt 223 lb 4.8 oz (101.3 kg)   SpO2 98%   BMI 36.04 kg/m   Weight yesterday-223 Last visit weight-217    ACTION: Home visit completed

## 2017-02-13 IMAGING — DX DG CHEST 2V
2 series · 2 of 2 positions shown · non-contrast
Comparison: Prior radiograph from 04/13/2016.

CLINICAL DATA: Initial evaluation for 1 week history of shortness
of breath.

EXAM:
CHEST  2 VIEW

[chest pa]
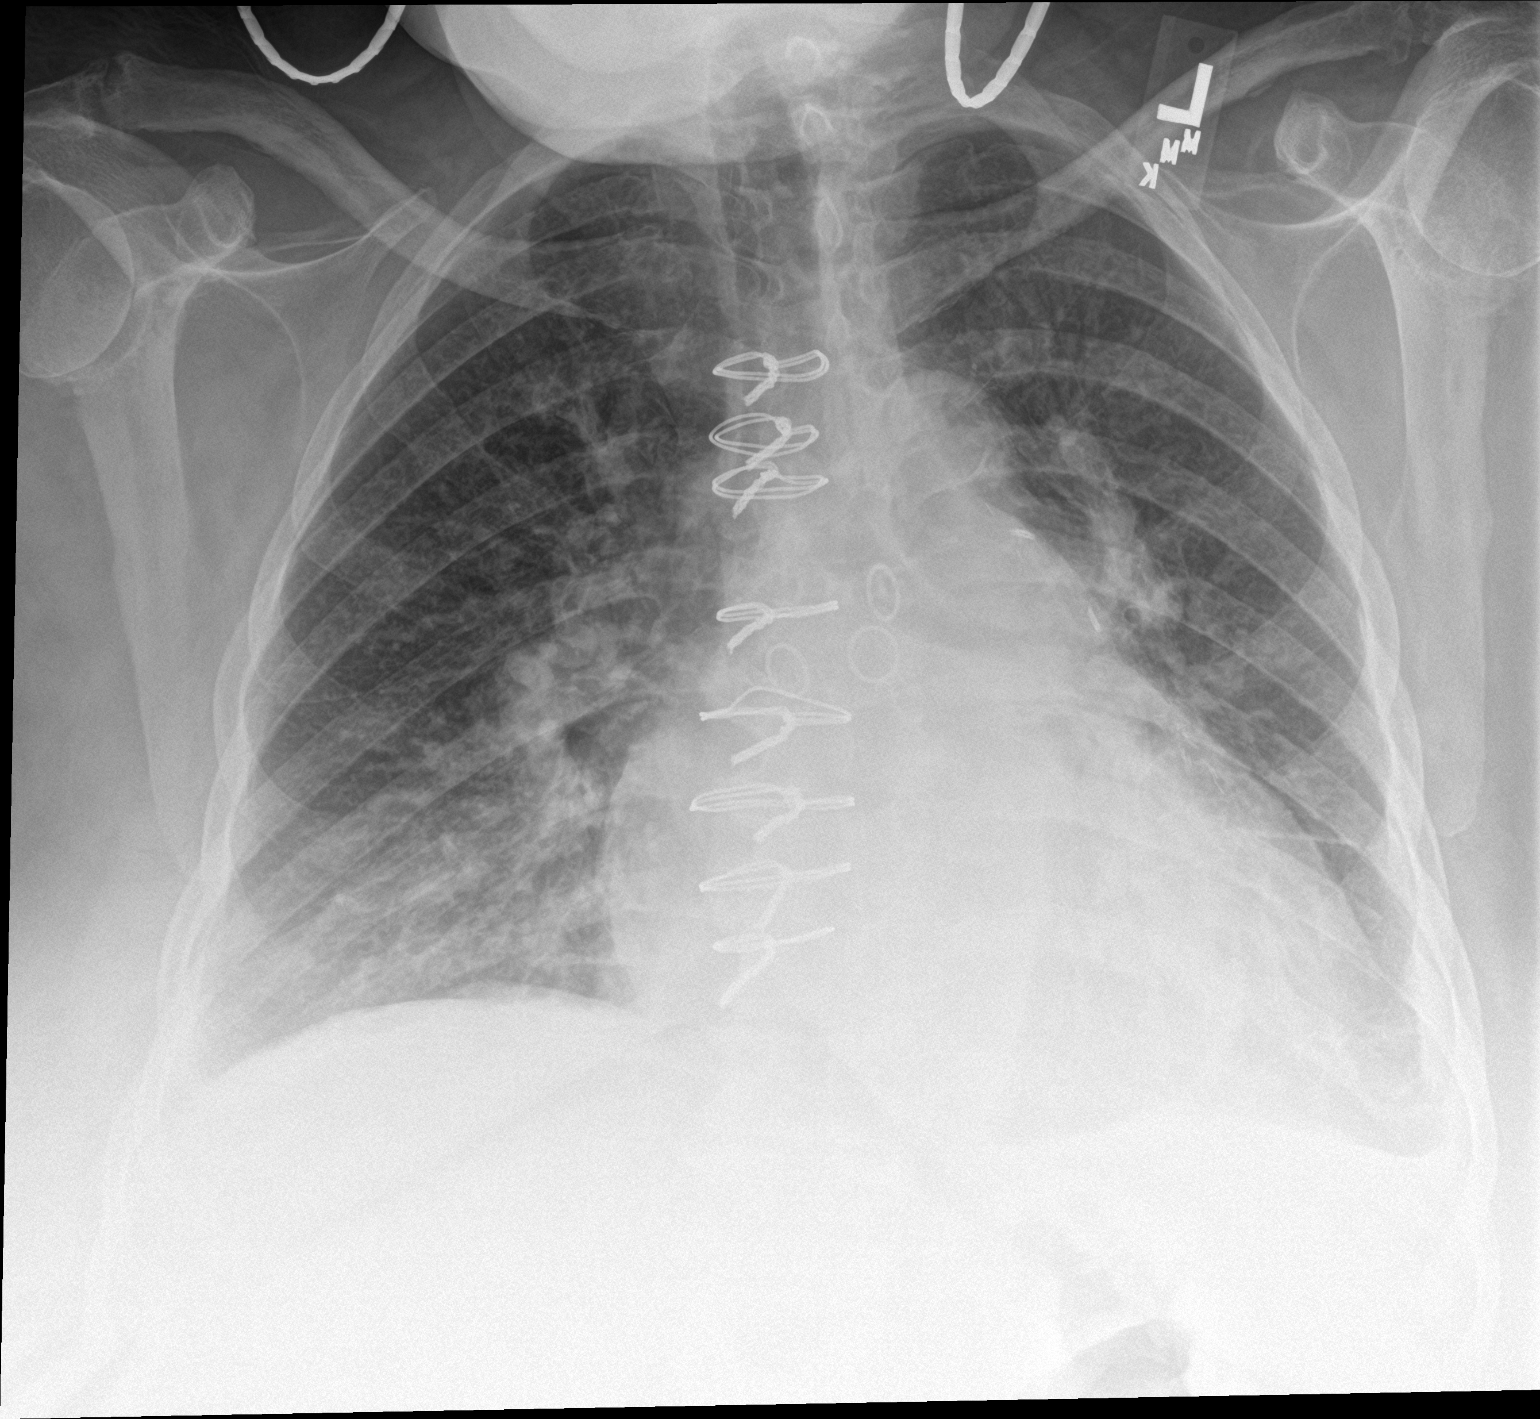

[chest lat]
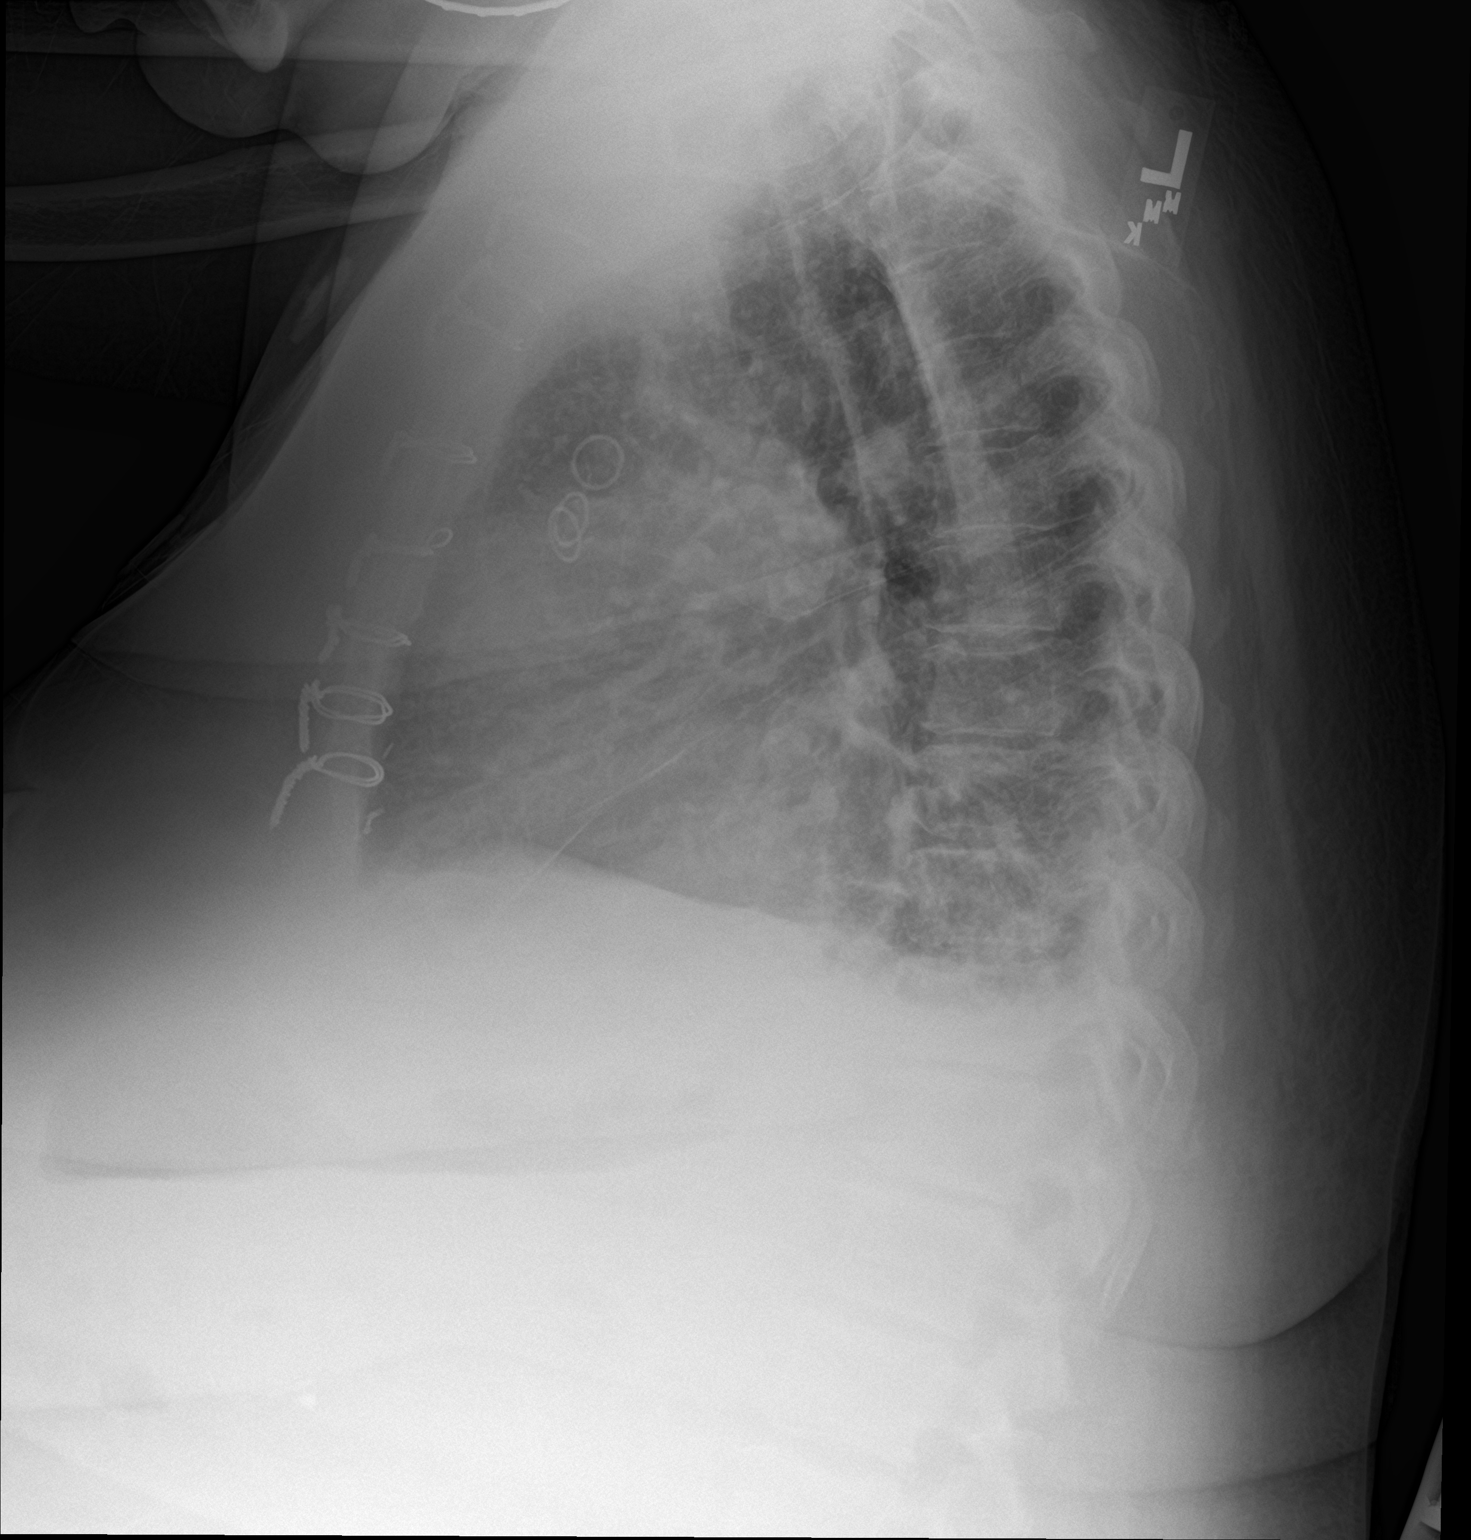

[2 of 2 positions shown; findings below may reference images not displayed]

FINDINGS: Moderate cardiomegaly, stable. Median sternotomy wires underlying
CABG markers and surgical clips noted. Mediastinal silhouette within
normal limits. Aortic atherosclerosis noted.

Lungs are normally inflated. Vascular congestion with indistinctness
of the interstitial markings, suggestive of mild diffuse pulmonary
interstitial edema. Small bilateral pleural effusions. No focal
infiltrates. No pneumothorax.

No acute osseous abnormality.
IMPRESSION: 1. Cardiomegaly with mild diffuse pulmonary interstitial edema and
small bilateral pleural effusions.
2. Sequela of prior CABG.
3. Aortic atherosclerosis.

## 2017-02-18 ENCOUNTER — Inpatient Hospital Stay (HOSPITAL_COMMUNITY): Admission: RE | Admit: 2017-02-18 | Payer: Medicare Other | Source: Ambulatory Visit

## 2017-02-18 ENCOUNTER — Other Ambulatory Visit (HOSPITAL_COMMUNITY): Payer: Self-pay

## 2017-02-18 ENCOUNTER — Telehealth (HOSPITAL_COMMUNITY): Payer: Self-pay | Admitting: *Deleted

## 2017-02-18 NOTE — Telephone Encounter (Signed)
[name] left a VM earlier on triage line stating pt's wt was up 10 lbs, pt denied sob, pt does have edema up to her knees.    Returned call to [place] as pt was supposed to have an appt w/our office today which she cancelled.  Geraldine Contras states pt told her she was canceling appt today due to diarrhea, she states pt does not have nausea/vomiting, just some diarrhea that pt relates to taking her lactulose.  She states pt has been very noncompliant w/diet, she ate [place] food yesterday and had fish and fries on the weekend.  Discussed all above w/[name], NP she orders 80 mg IV lasix to be given by [facility].  Geraldine Contras is aware of plan and will see pt later this week.  Called and spoke w/pt, she is aware [facility] will be coming out to give IV Lasix, she admits to dietary noncompliance and states she will try harder.  She will keep appt next week.  Order for IV lasix faxed to [facility] [phone].

## 2017-02-18 NOTE — Progress Notes (Signed)
Paramedicine Encounter    Patient ID: Christine Knight, female    DOB: 05-21-1951, 66 y.o.   MRN: [id number]  Patient Care Team: Secundino Ginger, PA-C as PCP - General (Cardiology) Carlena Bjornstad, MD (Cardiology)  Patient Active Problem List   Diagnosis Date Noted  . RVF (right ventricular failure) ([place])   . Hepatic encephalopathy ([place]) 01/03/2017  . Systolic CHF, acute on chronic ([place]) 01/02/2017  . Acute on chronic systolic ACC/AHA stage C congestive heart failure ([place]) 12/06/2016  . CAD (coronary artery disease) 12/06/2016  . Suspected sleep apnea 11/21/2016  . Acute on chronic diastolic CHF (congestive heart failure) ([place]) 08/26/2016  . CHF (congestive heart failure) ([place]) 08/26/2016  . CKD (chronic kidney disease) stage 3, GFR 30-59 ml/min 05/02/2016  . Chest tightness   . AKI (acute kidney injury) ([place]) 12/08/2015  . Mass of lower lobe of left lung 07/25/2015  . Primary gout 07/25/2015  . NSTEMI- declined cath 05/29/2015  . DJD (degenerative joint disease), multiple sites 11/07/2014  . Painful diabetic neuropathy ([place]) 10/10/2014  . Hyperkalemia 06/17/2013  . Chronic systolic heart failure ([place]) 04/02/2013  . Hypothyroidism 03/18/2013  . Pulmonary hypertension ([place])   . Hx of CABG 2012 08/30/2011  . Obesity, morbid-BMI 45   . Type 2 diabetes, uncontrolled, with neuropathy ([place])   . Ischemic cardiomyopathy-30-35% May 2016 08/13/2011  . Hyperlipidemia LDL goal <70 08/10/2011    Current Outpatient Prescriptions:  .  acetaminophen (TYLENOL) 650 MG CR tablet, Take 650 mg by mouth every 8 (eight) hours as needed (for arthritic pain). , Disp: , Rfl:  .  aspirin 81 MG tablet, Take 1 tablet (81 mg total) by mouth daily., Disp: 30 tablet, Rfl: 9 .  atorvastatin (LIPITOR) 40 MG tablet, Take 1 tablet (40 mg total) by mouth daily., Disp: 90 tablet, Rfl: 3 .  glipiZIDE (GLUCOTROL) 10 MG tablet, Take 10 mg by mouth 2 (two) times daily., Disp: , Rfl:  .  lactulose (CHRONULAC) 10  GM/15ML solution, Take 45 mLs (30 g total) by mouth 2 (two) times daily., Disp: 2700 mL, Rfl: 6 .  Multiple Vitamins-Minerals (CENTRUM SILVER 50+WOMEN PO), Take 1 tablet by mouth every morning., Disp: , Rfl:  .  potassium chloride SA (K-DUR,KLOR-CON) 20 MEQ tablet, Take 1 tablet (20 mEq total) by mouth daily., Disp: 30 tablet, Rfl: 6 .  rifaximin (XIFAXAN) 550 MG TABS tablet, Take 1 tablet (550 mg total) by mouth 2 (two) times daily., Disp: 60 tablet, Rfl: 12 .  spironolactone (ALDACTONE) 25 MG tablet, Take 1 tablet (25 mg total) by mouth daily., Disp: 30 tablet, Rfl: 5 .  torsemide (DEMADEX) 20 MG tablet, Take 4 tablets (80 mg total) by mouth 2 (two) times daily., Disp: 240 tablet, Rfl: 12 .  gabapentin (NEURONTIN) 300 MG capsule, Take 300 mg by mouth 3 (three) times daily. , Disp: , Rfl:  Allergies  Allergen Reactions  . Lisinopril Nausea And Vomiting, Other (See Comments) and Cough    Coughing, throwing up, waking up coughing  . Metformin And Related Anaphylaxis     Social History   Social History  . Marital status: Divorced    Spouse name: N/A  . Number of children: 3  . 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 Smoking status: Former Smoker    Types: Cigarettes    Quit date: 11/11/1978  . Smokeless tobacco: Never Used     Comment: 03/17/2013 "  only a social smoker when I did smoke; ever bought any"  . Alcohol use Yes     Comment: OCCASIONAL  . Drug use: No  . Sexual activity: Not Currently    Birth control/ protection: Post-menopausal   Other Topics Concern  . Not on file   Social History Narrative   Lives with her sister and daughter.  Ambulates independently.    Physical Exam  Pulmonary/Chest: No respiratory distress. She has no wheezes. She has no rales.  Abdominal: She exhibits no distension. There is no guarding.  Musculoskeletal: She exhibits edema.  Skin: Skin is warm and dry. She is not diaphoretic.         Future Appointments Date Time Provider [place]  02/25/2017 9:00 AM [facility] PA/NP [facility] None   Pt cancelled her appointment with heat clinic due to abdominal pain today, therefore I met her at home.   ATF pt CAO x4 sitting in the living room c/o diarrhea all day.  Pt stated that she could not make her appointment due to the same.  Pt stated that she has used the bathroom at least five times this am and is scared to leave the house.  Pt's weight has increased by 10lbs over this week. Pt stated that she had japanese food, fish, stuffed, greens, and cornbread over the weekend. Pt denies sob, difficulty breathing, dizziness, headache, and chest pain.  Heart failure clinic made aware of pt's weight gain via voicemail.  rx bottles and pill box verified.    Called in [place] ([facility] xifaxan filled until fri evein its ready at [facility] pt...  BP 118/80 (BP Location: Left Arm, Patient Position: Sitting, Cuff Size: Normal)   Pulse (!) 56   Wt 233 lb (105.7 kg)   SpO2 98%   BMI 37.61 kg/m  CBG 181 Weight yesterday-didn't weigh Last visit weight-223    ACTION: Home visit completed Next visit planned for next week

## 2017-02-24 ENCOUNTER — Telehealth (HOSPITAL_COMMUNITY): Payer: Self-pay | Admitting: Cardiology

## 2017-02-24 ENCOUNTER — Telehealth (HOSPITAL_COMMUNITY): Payer: Self-pay | Admitting: *Deleted

## 2017-02-24 NOTE — Telephone Encounter (Signed)
Abnormal labs received from [facility] Labs drawn 4/12-  k 5.2 Cr 1.38  Per VO  Otilio Saber, [place] Patient should hold potassium and recheck labs 4/17   Detailed message left for patient and orders sent to [facility]

## 2017-02-24 NOTE — Telephone Encounter (Signed)
[facility] called to report that 80 mg IV lasix was given to patient on 02/19/2017.  Her weight on 02/19/2017 was 234.2 lbs and on 02/20/2017 her weight was 231.2 lb s.

## 2017-02-24 NOTE — Telephone Encounter (Signed)
Opened in error

## 2017-02-25 ENCOUNTER — Encounter (HOSPITAL_COMMUNITY): Payer: Self-pay

## 2017-02-25 ENCOUNTER — Other Ambulatory Visit (HOSPITAL_COMMUNITY): Payer: Self-pay

## 2017-02-25 ENCOUNTER — Ambulatory Visit (HOSPITAL_COMMUNITY)
Admission: RE | Admit: 2017-02-25 | Discharge: 2017-02-25 | Disposition: A | Discharge status: Home / Self Care | Payer: Medicare Other | Source: Ambulatory Visit | Attending: Internal Medicine | Admitting: Internal Medicine

## 2017-02-25 VITALS — BP 116/70 | HR 72 | Wt 237.8 lb

## 2017-02-25 DIAGNOSIS — N183 Chronic kidney disease, stage 3 unspecified: Secondary | ICD-10-CM

## 2017-02-25 DIAGNOSIS — I5023 Acute on chronic systolic (congestive) heart failure: Secondary | ICD-10-CM | POA: Diagnosis not present

## 2017-02-25 DIAGNOSIS — E875 Hyperkalemia: Secondary | ICD-10-CM | POA: Diagnosis not present

## 2017-02-25 DIAGNOSIS — I252 Old myocardial infarction: Secondary | ICD-10-CM | POA: Diagnosis not present

## 2017-02-25 DIAGNOSIS — I2510 Atherosclerotic heart disease of native coronary artery without angina pectoris: Secondary | ICD-10-CM | POA: Diagnosis not present

## 2017-02-25 DIAGNOSIS — I5022 Chronic systolic (congestive) heart failure: Secondary | ICD-10-CM | POA: Insufficient documentation

## 2017-02-25 DIAGNOSIS — G4733 Obstructive sleep apnea (adult) (pediatric): Secondary | ICD-10-CM | POA: Insufficient documentation

## 2017-02-25 DIAGNOSIS — E1122 Type 2 diabetes mellitus with diabetic chronic kidney disease: Secondary | ICD-10-CM | POA: Diagnosis not present

## 2017-02-25 DIAGNOSIS — I255 Ischemic cardiomyopathy: Secondary | ICD-10-CM | POA: Diagnosis not present

## 2017-02-25 DIAGNOSIS — I5020 Unspecified systolic (congestive) heart failure: Secondary | ICD-10-CM

## 2017-02-25 DIAGNOSIS — Z7982 Long term (current) use of aspirin: Secondary | ICD-10-CM | POA: Insufficient documentation

## 2017-02-25 DIAGNOSIS — I130 Hypertensive heart and chronic kidney disease with heart failure and stage 1 through stage 4 chronic kidney disease, or unspecified chronic kidney disease: Secondary | ICD-10-CM | POA: Diagnosis not present

## 2017-02-25 DIAGNOSIS — Z951 Presence of aortocoronary bypass graft: Secondary | ICD-10-CM | POA: Diagnosis not present

## 2017-02-25 LAB — BASIC METABOLIC PANEL
Anion gap: 8 (ref 5–15)
BUN: 58 mg/dL — AB (ref 6–20)
CALCIUM: 8.8 mg/dL — AB (ref 8.9–10.3)
CHLORIDE: 99 mmol/L — AB (ref 101–111)
CO2: 28 mmol/L (ref 22–32)
CREATININE: 1.5 mg/dL — AB (ref 0.44–1.00)
GFR calc non Af Amer: 35 mL/min — ABNORMAL LOW (ref >60)
GFR, EST AFRICAN AMERICAN: 41 mL/min — AB (ref >60)
GLUCOSE: 79 mg/dL (ref 65–99)
Potassium: 4.3 mmol/L (ref 3.5–5.1)
Sodium: 135 mmol/L (ref 135–145)

## 2017-02-25 LAB — MAGNESIUM: Magnesium: 2.2 mg/dL (ref 1.7–2.4)

## 2017-02-25 MED FILL — XIFAXAN 550 MG TABLET: 550 | 30 days supply | Qty: 60 | Fill #1

## 2017-02-25 MED FILL — TORSEMIDE 20 MG TABLET: 20 | 30 days supply | Qty: 240 | Fill #1

## 2017-02-25 NOTE — Progress Notes (Signed)
Patient ID: Christine Knight, female   DOB: [birth year], 66 y.o.   MRN: [id number]     Advanced Heart Failure Clinic Note   PCP: Arnette Felts  HF: Dr. Gala Romney   HPI: [name] is a 66 y.o. female w/ PMHx significant for morbid obesity, systolic HF due to ICM (10/12 EF 25%; 07/2012 EF 35%; 03/18/13 EF 20-25%), CAD with h/o NSTEMI s/p CABG Oct 2012, pulmonary hypertension (PA peak ), and poorly controlled DM 2.  She was admitted to [facility] in 2014 for dyspnea, leg edema, and weight gain.  Lasix gtt used and she diuresed 15 liters. Diuresed 29 pounds.  ECHO- EF 20-25%.  Discharge weight 264 pounds.    Admitted 12/08/2015 - 12/28/2015 with worsening DOE and cough. Stated she had a 40 lb weight since 07/2015. She was diuresed aggressively with lasix gtt at 30 mg/hr and metolazone 5 mg BID. Diamox eventually added. Diuretics held temporarily towards end of stay with worsening Creatinine. Overall she diuresed >40 L and was down 70 lbs from admission weight. Pt had R/LHC with preserved CO and mild/mod Pulm HTN. Severe native CAD with patent CABG grafts. Discharge weight 237 lbs.   Admitted 6/1 -04/26/2016 with marked volume overload, low output hf, cardiorenal syndrome. Diuresed with high dose lasix 160 mg three times a day. Diuresed over 50 pounds. Transitioned to torsemide 80 mg daily. BB and losartan stopped. Discharge weight 222 pounds   Admitted 10/15 - 09/08/16 with marked volume overload. PT diuresed with high dose IV lasix, metolazone, as well as Milrinone to augment diuresis. Diamox also added.  Pt diuresed 35 L and lost 44 lbs. Pt transitioned back to po torsemide, and had to be adjusted further with AKI.  Discharge weight 224 lbs.  Admitted  12/6 through 11/02/16 with marked volume overload. Diuresed with IV lasix, metolazone, and milrinone. Overall diuresed 41 pounds. Discharged on torsemide 60 mg twice a day. Discharge weight was 211 pounds.   Admitted 2/22 through 01/17/2017 with marked volume  overload. Diuresed with lasix drip and milrinone 0.25 mcg. Ammonia was high so xifaxan started. Discharged on torsemide 80 mg twice a day. Discharge weight was 210 pounds.   Seen in the ED on 01/24/17 with altered mental status, resolved in the ED. Ammonia was 79. She was sent home, not admitted.   Pt presents today for regular follow up.  Her weight is up 16 lbs from her last visit. She is drinking > 2L daily, and getting up in the night and drinking 1-2 glasses of water because she is thirsty. Ate chinese over the weekend. Taking all medications as directed. Got IV lasix last week.  Still has not received CPAP. +orthopnea and PND. Denies N/V, fever, chills, or diarrhea.   Review of systems complete and found to be negative unless listed in HPI.    Labs 2/17 K 4.3, Cr 1.56 Labs 04/26/2016: K 3.1 Creatinine 1.78 K 3.1 Labs 06/05/2016: K 4.3 Creatinine 1.25 Labas 06/13/2016: K 4.0 Creatinine 1.49  Labs 07/31/2016: K 4.5 Creatinine 2.27  Labs  08/05/2016: K 5.0 Creatinine 3.18  11/02/2017: K 3.6 Creatinine 1.59  01/24/2017: K 5.3 Creatinine 2.3   03/18/13 ECHO EF 20-25% 06/17/2013 ECHO EF 35% RV ok (read formally after visit 35-40%) 03/15/2015 ECHO EF 30-35%, grade 3 DD, Moderate TR, Severely reduced RV, PA peak pressure 81 mm Hg 12/09/2015 ECHO EF 15-20%, Decreased LV diastolic compliance, RV mod reduced, PA peak 57 mmHg, Severe TR  04/14/16 Echo 15-20%  10/2016: EF Peak PA pressure  89 mm hg  EF 35-40% RV dysfunction.    [facility] 12/13/15 Hemodynamics RA 25/25 (23) RV pressure 92/13  RV EDP 26 PA pressure 86/34 (56) PW mean 37 AO Pressure 114/72 (88) LV Pressure 101/12 (26) LV EDP 26 PVR 3.24 Fick CO/CI 5.85 / 2.41L/min   Prox LAD lesion, 100% stenosed. Prox Cx lesion, 99% stenosed. Mid RCA lesion, 80% stenosed. Dist RCA lesion, 100% stenosed. Patent grafts    Past Medical History:  Diagnosis Date  . CAD (coronary artery disease) 08/2011   NSTEMI with subsequent CABG 08/13/2011 with  LIMA-LAD, SVG-diagonal, SVG-OM, SVG-PDA  . Carpal tunnel syndrome on both sides    "post OHS in 08/2011; resolved now" (03/17/2013)  . CHF (congestive heart failure) (HCC)   . Chronic systolic heart failure (HCC)    Chronic systolic CHF  . Diabetes mellitus type 2 in obese (HCC) 08/10/11  . Dyspnea   . Ejection fraction < 50%    EF 20%, October, 2012  //  EF 25% December, 2012  //   EF 35%, echo, September, 2013  . Hyperlipidemia 08/10/11  . Hypertension   . Ischemic cardiomyopathy 08/13/2011   EF 20% in 08/2011, still 25% 10/21/2011  . Mitral regurgitation    Mild by echo 10/21/11  . Myocardial infarction (HCC) 08/2011  . Obesity, morbid (HCC)   . Orthopnoea    "progressively worse over last 3 wks" (03/17/2013)  . Pleural effusion    Requiring L thoracentesis 09/02/11  . Pulmonary hypertension (HCC)    Echo, September, 2013, 72 mmHg.    Current Outpatient Prescriptions  Medication Sig Dispense Refill  . acetaminophen (TYLENOL) 650 MG CR tablet Take 650 mg by mouth every 8 (eight) hours as needed (for arthritic pain).     Marland Kitchen aspirin 81 MG tablet Take 1 tablet (81 mg total) by mouth daily. 30 tablet 9  . atorvastatin (LIPITOR) 40 MG tablet Take 1 tablet (40 mg total) by mouth daily. 90 tablet 3  . gabapentin (NEURONTIN) 300 MG capsule Take 300 mg by mouth 3 (three) times daily.     Marland Kitchen glipiZIDE (GLUCOTROL) 10 MG tablet Take 10 mg by mouth 2 (two) times daily.    Marland Kitchen lactulose (CHRONULAC) 10 GM/15ML solution Take 45 mLs (30 g total) by mouth 2 (two) times daily. 2700 mL 6  . Multiple Vitamins-Minerals (CENTRUM SILVER 50+WOMEN PO) Take 1 tablet by mouth every morning.    . potassium chloride SA (K-DUR,KLOR-CON) 20 MEQ tablet Take 1 tablet (20 mEq total) by mouth daily. 30 tablet 6  . rifaximin (XIFAXAN) 550 MG TABS tablet Take 1 tablet (550 mg total) by mouth 2 (two) times daily. 60 tablet 12  . spironolactone (ALDACTONE) 25 MG tablet Take 1 tablet (25 mg total) by mouth daily. 30 tablet 5  .  torsemide (DEMADEX) 20 MG tablet Take 4 tablets (80 mg total) by mouth 2 (two) times daily. 240 tablet 12   No current facility-administered medications for this encounter.      PHYSICAL EXAM: Vitals:   02/25/17 0908  BP: 116/70  Pulse: 72  SpO2: 98%  Weight: 237 lb 12.8 oz (107.9 kg)   Wt Readings from Last 3 Encounters:  02/25/17 237 lb 12.8 oz (107.9 kg)  02/18/17 233 lb (105.7 kg)  02/12/17 223 lb 4.8 oz (101.3 kg)     General: Obese female in NAD, daughters present at visit.  HEENT: normal, atraumatic.  Neck: JVP to jaw. Carotids 2+ bilat; no bruits. No thyromegaly or nodule  noted.  Cor: PMI non displaced, regular rate and rhythm. No murmurs, rubs or gallops.  Lungs: clear in upper lobes, diminished in bases.  Abdomen: Obese, soft, non-tender.  ND, no HSM. No bruits or masses. + bowel sounds. Extremities: no cyanosis, clubbing, rash. Trace pedal edema  Neuro: alert & orientedx3, cranial nerves grossly intact.Moves all 4 extremities without difficulty.   ASSESSMENT/PLAN 1. Acute on Chronic systolic HF due to ICM EF 15-20% Echo 04/14/2016.  - NYHA class III.  - Volume status elevated but stable functionally. This in setting of marked fluid and dietary compliance.  - Continue torsemide 80 mg twice a day. Will have [facility] give IV lasix tomorrow and Friday (4/18 and 4/20) - BMET and Mg today and repeat BMET per [facility] on Friday.  - No beta blocker with low output.  - Continue Spiro  daily. For now. Recent K elevated. Stopped K supp. May have to stop/cut back.  - No ACE due to allergy - No Entresto due to AKI.  - No dig with CKD.  - Intolerant bidil with difficulty with headaches, dizziness and hypotension.  - Reinforced fluid restriction to < 2 L daily, sodium restriction to less than 2000 mg daily, and the importance of daily weights.   2. CAD s/p NSTEMI and CABG 10/12  - No chest pain. No change to current plan.   - Continue atorvastatin 40 mg daily.  - Severe native CAD  with patent grafts on [facility] 12/13/15. Continue ASA 81 mg.  3. Morbid obesity - Stressed importance of portion control, as well as fluid and sodium restriction. Needs to increase activity as tolerated.  as   4. Pulmonary HTN - mild to mod RHC 12/13/15. PVR 3.24.  - No change to current plan. Diuresing.  5. CKD stage III-V - Relatively stable. BMET today and repeat Friday with IV lasix.  - Advised her to avoid NSAIDs.  6. DM2  - Per PCP.  7. Obstructive sleep apnea: - had sleep study 12/25/16 and has severe obstructive sleep apnea. Will send message to Dr. Mayford Knife for her office to set her up with a CPAP.  - Still awaiting CPAP machine.   8. Gout - Per PCP 9. Hyperkalemia  - Labs from 4/12 with K 5.2. Repeat BMET today.  - K stopped.  -Discussed limiting high potassium food. (Has been eating a lot of tomatoes and strawberries.) 10. Advanced directives - Completed GOLD DNR form 01/2017.   - Will hold off on referring to Hospice now, but discussed with family that it is likely a possibility. No change.  [name], PA-C  9:21 AM   Greater than 50% of the 25 minute visit was spent in counseling/coordination of care regarding disease state education, fluid and sodium restriction, and prognosis.

## 2017-02-25 NOTE — Patient Instructions (Signed)
Routine lab work today. Will notify you of abnormal results, otherwise no news is good news!  No changes to medication today.  Will have [facility] administer IV lasix  once on Wednesday and once on Friday.  Follow up with 2 weeks with Otilio Saber PA-C.  Do the following things EVERYDAY: 1) Weigh yourself in the morning before breakfast. Write it down and keep it in a log. 2) Take your medicines as prescribed 3) Eat low salt foods-Limit salt (sodium) to 2000 mg per day.  4) Stay as active as you can everyday 5) Limit all fluids for the day to less than 2 liters

## 2017-02-25 NOTE — Progress Notes (Signed)
Paramedicine Encounter   Patient ID: Christine Knight , female,   DOB: Jan 20, 1951,65 y.o.,  MRN: [id number]   Met patient in clinic today with provider Jonni Sanger. Pt has gained about 16 lbs within a month.  Pt has not been keeping track of her fluid intake which includes fluids that she drinks and fruits.  Pt has also been eating chinese foods, japanese foods, fish etc.  Pt has been taking her medications without difficulty.  Jonni Sanger is going to order IV lasix this week and pt will return Friday for lab work Friday. Pt has had an increase in sob.   No spiro in fri- sun her son has picked up the prescription but no one knows where he has placed it, her daughter tracy was advised where to place the pills when the pills are found.  pt is no longer to take potassium.   **2 torsemide in until tue afternoon  ** called in  Torsemide gabapentin  Time spent with patient 45 mins  Nil Xiong, EMT-Paramedic 02/25/2017   ACTION: Home visit completed

## 2017-02-27 ENCOUNTER — Telehealth (HOSPITAL_COMMUNITY): Payer: Self-pay

## 2017-02-27 ENCOUNTER — Other Ambulatory Visit (HOSPITAL_COMMUNITY): Payer: Self-pay

## 2017-02-27 ENCOUNTER — Telehealth (HOSPITAL_COMMUNITY): Payer: Self-pay | Admitting: *Deleted

## 2017-02-27 NOTE — Telephone Encounter (Signed)
[facility] RN called to update on patient's status at home. States IV placed successfully by EMS after multiple attempts from [facility] RN's. 80 mg iv lasix given once today. RN reports only 1 void while she was there during her hour visit. RN to give another 80 mg IV dose lasix once tomorrow with weight check and bmet. Per Otilio Saber PA-C, ok to keep PIV in and secured for next week incase further IV lasix needed at home.  PIV good for 7 days, RN to date and securely wrap IV.   Ave Filter, RN

## 2017-02-27 NOTE — Telephone Encounter (Signed)
Particia Jasper with [facility] called this morning to let us know that she went to visit patient with another nurse and they were unable to start an IV to give patient 80 mg IV lasix that was ordered. She is asking if we would consider starting PICC line on patient for future IV administrations as patient is a really hard stick. Also if we want the patient to come into our clinic today for [place] to give patient the lasix.

## 2017-02-27 NOTE — Progress Notes (Signed)
Paramedicine Encounter    Patient ID: Christine Knight, female    DOB: Mar 22, 1951, 66 y.o.   MRN: [id number]    Patient Care Team: Coralee Rud, PA-C as PCP - General (Cardiology) Luis Abed, MD (Cardiology)  Patient Active Problem List   Diagnosis Date Noted  . OSA (obstructive sleep apnea) 02/25/2017  . RVF (right ventricular failure) (HCC)   . Hepatic encephalopathy (HCC) 01/03/2017  . Systolic CHF, acute on chronic (HCC) 01/02/2017  . Acute on chronic systolic ACC/AHA stage C congestive heart failure (HCC) 12/06/2016  . CAD (coronary artery disease) 12/06/2016  . Suspected sleep apnea 11/21/2016  . Acute on chronic diastolic CHF (congestive heart failure) (HCC) 08/26/2016  . CHF (congestive heart failure) (HCC) 08/26/2016  . CKD (chronic kidney disease) stage 3, GFR 30-59 ml/min 05/02/2016  . Chest tightness   . Mass of lower lobe of left lung 07/25/2015  . Primary gout 07/25/2015  . NSTEMI- declined cath 05/29/2015  . DJD (degenerative joint disease), multiple sites 11/07/2014  . Painful diabetic neuropathy (HCC) 10/10/2014  . Hyperkalemia 06/17/2013  . Chronic systolic heart failure (HCC) 04/02/2013  . Hypothyroidism 03/18/2013  . Pulmonary hypertension (HCC)   . Hx of CABG 2012 08/30/2011  . Obesity, morbid-BMI 45   . Type 2 diabetes, uncontrolled, with neuropathy (HCC)   . Ischemic cardiomyopathy-30-35% May 2016 08/13/2011  . Hyperlipidemia LDL goal <70 08/10/2011    Current Outpatient Prescriptions:  .  acetaminophen (TYLENOL) 650 MG CR tablet, Take 650 mg by mouth every 8 (eight) hours as needed (for arthritic pain). , Disp: , Rfl:  .  aspirin 81 MG tablet, Take 1 tablet (81 mg total) by mouth daily., Disp: 30 tablet, Rfl: 9 .  atorvastatin (LIPITOR) 40 MG tablet, Take 1 tablet (40 mg total) by mouth daily., Disp: 90 tablet, Rfl: 3 .  gabapentin (NEURONTIN) 300 MG capsule, Take 300 mg by mouth 3 (three) times daily. , Disp: , Rfl:  .  glipiZIDE (GLUCOTROL)  10 MG tablet, Take 10 mg by mouth 2 (two) times daily., Disp: , Rfl:  .  lactulose (CHRONULAC) 10 GM/15ML solution, Take 45 mLs (30 g total) by mouth 2 (two) times daily., Disp: 2700 mL, Rfl: 6 .  Multiple Vitamins-Minerals (CENTRUM SILVER 50+WOMEN PO), Take 1 tablet by mouth every morning., Disp: , Rfl:  .  potassium chloride SA (K-DUR,KLOR-CON) 20 MEQ tablet, Take 1 tablet (20 mEq total) by mouth daily., Disp: 30 tablet, Rfl: 6 .  rifaximin (XIFAXAN) 550 MG TABS tablet, Take 1 tablet (550 mg total) by mouth 2 (two) times daily., Disp: 60 tablet, Rfl: 12 .  spironolactone (ALDACTONE) 25 MG tablet, Take 1 tablet (25 mg total) by mouth daily., Disp: 30 tablet, Rfl: 5 .  torsemide (DEMADEX) 20 MG tablet, Take 4 tablets (80 mg total) by mouth 2 (two) times daily., Disp: 240 tablet, Rfl: 12 Allergies  Allergen Reactions  . Lisinopril Nausea And Vomiting, Other (See Comments) and Cough    Coughing, throwing up, waking up coughing  . Metformin And Related Anaphylaxis     Social History   Social History  . Marital status: Divorced    Spouse name: N/A  . Number of children: 3  . 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 Smoking status: Former Smoker    Types: Cigarettes    Quit date: 11/11/1978  . Smokeless tobacco: Never Used     Comment:  03/17/2013 "only a social smoker when I did smoke; ever bought any"  . Alcohol use Yes     Comment: OCCASIONAL  . Drug use: No  . Sexual activity: Not Currently    Birth control/ protection: Post-menopausal   Other Topics Concern  . Not on file   Social History Narrative   Lives with her sister and daughter.  Ambulates independently.    Physical Exam      Future Appointments Date Time Provider Department Center  03/11/2017 3:00 PM [facility] PA/NP [facility] None    ATF pt CAO x4 sitting on the bed in her bedroom, pt has been doing chores all day.  CHP visit requested by the heart failure  clinic to gain IV access so that [facility] can give pt IV lasix.  Pt has no complaints today.  IV place in pt's right forearm 20 gauge.  [facility] advised of the same.  BP 112/80 (BP Location: Left Arm, Patient Position: Sitting, Cuff Size: Large)   Pulse (!) 52   SpO2 100%    Dyami Umbach, EMT Paramedic 02/27/2017    ACTION: Home visit completed

## 2017-02-27 NOTE — Telephone Encounter (Signed)
I discussed with Otilio Saber, PA and he advises to have [facility] with Paramed to try and start an IV on patient then have [facility] RN administer 80 mg of IV lasix today and tomorrow. IV will remain in place until after the second dose is given.    [name] and Angie are both aware of plan and will report back if IV is still unable to be started.

## 2017-02-28 ENCOUNTER — Telehealth (HOSPITAL_COMMUNITY): Payer: Self-pay | Admitting: *Deleted

## 2017-02-28 NOTE — Telephone Encounter (Signed)
HHRN called to report pt's wt today was 236.6, only slightly down from yesterday.  She states she gave the 80 mg IV Lasix and drew labs for bmet, she is unsure if there will be enough blood in tube but she sent it to lab.  She states she had to d/c PIV as pt had no one available to flush it for her.  She will see pt again on Monday to f/u and will let us know how she is doing.

## 2017-03-03 ENCOUNTER — Telehealth (HOSPITAL_COMMUNITY): Payer: Self-pay | Admitting: *Deleted

## 2017-03-03 ENCOUNTER — Other Ambulatory Visit (HOSPITAL_COMMUNITY): Payer: Self-pay | Admitting: Student

## 2017-03-03 MED FILL — LYRICA 100 MG CAPSULE: 100 | 30 days supply | Qty: 60 | Fill #0

## 2017-03-03 NOTE — Telephone Encounter (Signed)
Angie with [facility] called stating patient weight was still up her potassium was 7.2 on Friday and patient is a" hard stick" to recheck potassium.  Per Otilio Saber patient needs to go to the Emergency room to be admitted. I left a VM on patients number as well as her daughter Aurther Loft) number.  VM advised pt to go to the ED and to call me back to let me know she received my VM.

## 2017-03-03 NOTE — Progress Notes (Signed)
Error

## 2017-03-03 NOTE — Telephone Encounter (Signed)
Left [name] with paramedicine a VM to call me back.  I need patient to go to the emergency room to be admitted but cant reach patient or her daughter. Need to see if paramedicine can go to patients home and advise pt to go to the emergency room.

## 2017-03-03 NOTE — Telephone Encounter (Signed)
Spoke with patient and advised to return to ER for recheck and further evaluation. Patient reports she just left the office of Dr Peggye Fothergill and "its raining outside" would really like to go home. Currently in the grocery store.   Advised it is highly recommended she return to the ER for recheck labs, further evaluation and possible admission for diuresis.   Patient again reports she does not want to report to ER  Per Otilio Saber, PA We highly recommend she come to the ER tonight however she can be admitted tomorrow  Pt agreeable to admission 4/24, will report to ER overnight if she becomes worse.   Stepdown bed request placed for 4/24, Joanell Rising aware.

## 2017-03-04 ENCOUNTER — Inpatient Hospital Stay (HOSPITAL_COMMUNITY): Payer: Medicare Other

## 2017-03-04 ENCOUNTER — Encounter (HOSPITAL_COMMUNITY): Payer: Self-pay | Admitting: General Practice

## 2017-03-04 ENCOUNTER — Inpatient Hospital Stay (HOSPITAL_COMMUNITY)
Admission: RE | Admit: 2017-03-04 | Discharge: 2017-03-13 | DRG: 292 | Disposition: A | Discharge status: Home / Self Care | Payer: Medicare Other | Source: Ambulatory Visit | Attending: Internal Medicine | Admitting: Internal Medicine

## 2017-03-04 DIAGNOSIS — Z79899 Other long term (current) drug therapy: Secondary | ICD-10-CM | POA: Diagnosis not present

## 2017-03-04 DIAGNOSIS — I5082 Biventricular heart failure: Secondary | ICD-10-CM | POA: Diagnosis present

## 2017-03-04 DIAGNOSIS — I252 Old myocardial infarction: Secondary | ICD-10-CM

## 2017-03-04 DIAGNOSIS — E785 Hyperlipidemia, unspecified: Secondary | ICD-10-CM | POA: Diagnosis present

## 2017-03-04 DIAGNOSIS — Z951 Presence of aortocoronary bypass graft: Secondary | ICD-10-CM

## 2017-03-04 DIAGNOSIS — I255 Ischemic cardiomyopathy: Secondary | ICD-10-CM | POA: Diagnosis present

## 2017-03-04 DIAGNOSIS — Z7982 Long term (current) use of aspirin: Secondary | ICD-10-CM | POA: Diagnosis not present

## 2017-03-04 DIAGNOSIS — Z87891 Personal history of nicotine dependence: Secondary | ICD-10-CM

## 2017-03-04 DIAGNOSIS — E11649 Type 2 diabetes mellitus with hypoglycemia without coma: Secondary | ICD-10-CM | POA: Diagnosis present

## 2017-03-04 DIAGNOSIS — Z6838 Body mass index (BMI) 38.0-38.9, adult: Secondary | ICD-10-CM | POA: Diagnosis not present

## 2017-03-04 DIAGNOSIS — I272 Pulmonary hypertension, unspecified: Secondary | ICD-10-CM | POA: Diagnosis present

## 2017-03-04 DIAGNOSIS — E039 Hypothyroidism, unspecified: Secondary | ICD-10-CM | POA: Diagnosis present

## 2017-03-04 DIAGNOSIS — E1122 Type 2 diabetes mellitus with diabetic chronic kidney disease: Secondary | ICD-10-CM | POA: Diagnosis present

## 2017-03-04 DIAGNOSIS — K7290 Hepatic failure, unspecified without coma: Secondary | ICD-10-CM | POA: Diagnosis present

## 2017-03-04 DIAGNOSIS — Z7984 Long term (current) use of oral hypoglycemic drugs: Secondary | ICD-10-CM

## 2017-03-04 DIAGNOSIS — E876 Hypokalemia: Secondary | ICD-10-CM | POA: Diagnosis present

## 2017-03-04 DIAGNOSIS — Z8249 Family history of ischemic heart disease and other diseases of the circulatory system: Secondary | ICD-10-CM | POA: Diagnosis not present

## 2017-03-04 DIAGNOSIS — I2721 Secondary pulmonary arterial hypertension: Secondary | ICD-10-CM | POA: Diagnosis not present

## 2017-03-04 DIAGNOSIS — Z9111 Patient's noncompliance with dietary regimen: Secondary | ICD-10-CM | POA: Diagnosis not present

## 2017-03-04 DIAGNOSIS — N184 Chronic kidney disease, stage 4 (severe): Secondary | ICD-10-CM | POA: Diagnosis present

## 2017-03-04 DIAGNOSIS — N179 Acute kidney failure, unspecified: Secondary | ICD-10-CM | POA: Diagnosis not present

## 2017-03-04 DIAGNOSIS — I130 Hypertensive heart and chronic kidney disease with heart failure and stage 1 through stage 4 chronic kidney disease, or unspecified chronic kidney disease: Secondary | ICD-10-CM | POA: Diagnosis present

## 2017-03-04 DIAGNOSIS — G4733 Obstructive sleep apnea (adult) (pediatric): Secondary | ICD-10-CM | POA: Diagnosis present

## 2017-03-04 DIAGNOSIS — E875 Hyperkalemia: Secondary | ICD-10-CM | POA: Diagnosis present

## 2017-03-04 DIAGNOSIS — I50811 Acute right heart failure: Secondary | ICD-10-CM | POA: Diagnosis not present

## 2017-03-04 DIAGNOSIS — I5023 Acute on chronic systolic (congestive) heart failure: Secondary | ICD-10-CM | POA: Diagnosis not present

## 2017-03-04 DIAGNOSIS — I2510 Atherosclerotic heart disease of native coronary artery without angina pectoris: Secondary | ICD-10-CM | POA: Diagnosis present

## 2017-03-04 DIAGNOSIS — R001 Bradycardia, unspecified: Secondary | ICD-10-CM | POA: Diagnosis present

## 2017-03-04 DIAGNOSIS — R0602 Shortness of breath: Secondary | ICD-10-CM | POA: Diagnosis present

## 2017-03-04 HISTORY — DX: Unspecified osteoarthritis, unspecified site: M19.90

## 2017-03-04 LAB — CBC WITH DIFFERENTIAL/PLATELET
BASOS ABS: 0 10*3/uL (ref 0.0–0.1)
Basophils Relative: 1 %
Eosinophils Absolute: 0.2 10*3/uL (ref 0.0–0.7)
Eosinophils Relative: 3 %
HCT: 44 % (ref 36.0–46.0)
HEMOGLOBIN: 14.5 g/dL (ref 12.0–15.0)
LYMPHS PCT: 11 %
Lymphs Abs: 0.7 10*3/uL (ref 0.7–4.0)
MCH: 29.1 pg (ref 26.0–34.0)
MCHC: 33 g/dL (ref 30.0–36.0)
MCV: 88.4 fL (ref 78.0–100.0)
MONO ABS: 0.8 10*3/uL (ref 0.1–1.0)
Monocytes Relative: 12 %
NEUTROS ABS: 4.5 10*3/uL (ref 1.7–7.7)
Neutrophils Relative %: 73 %
Platelets: 258 10*3/uL (ref 150–400)
RBC: 4.98 MIL/uL (ref 3.87–5.11)
RDW: 16.5 % — AB (ref 11.5–15.5)
WBC: 6.2 10*3/uL (ref 4.0–10.5)

## 2017-03-04 LAB — COMPREHENSIVE METABOLIC PANEL
ALK PHOS: 179 U/L — AB (ref 38–126)
ALT: 25 U/L (ref 14–54)
AST: 52 U/L — AB (ref 15–41)
Albumin: 2.8 g/dL — ABNORMAL LOW (ref 3.5–5.0)
Anion gap: 9 (ref 5–15)
BILIRUBIN TOTAL: 1.2 mg/dL (ref 0.3–1.2)
BUN: 63 mg/dL — AB (ref 6–20)
CALCIUM: 8.7 mg/dL — AB (ref 8.9–10.3)
CO2: 26 mmol/L (ref 22–32)
Chloride: 98 mmol/L — ABNORMAL LOW (ref 101–111)
Creatinine, Ser: 1.67 mg/dL — ABNORMAL HIGH (ref 0.44–1.00)
GFR calc Af Amer: 36 mL/min — ABNORMAL LOW (ref >60)
GFR, EST NON AFRICAN AMERICAN: 31 mL/min — AB (ref >60)
Glucose, Bld: 158 mg/dL — ABNORMAL HIGH (ref 65–99)
POTASSIUM: 4.8 mmol/L (ref 3.5–5.1)
Sodium: 133 mmol/L — ABNORMAL LOW (ref 135–145)
TOTAL PROTEIN: 6.4 g/dL — AB (ref 6.5–8.1)

## 2017-03-04 LAB — GLUCOSE, CAPILLARY
GLUCOSE-CAPILLARY: 157 mg/dL — AB (ref 65–99)
Glucose-Capillary: 190 mg/dL — ABNORMAL HIGH (ref 65–99)
Glucose-Capillary: 183 mg/dL — ABNORMAL HIGH (ref 65–99)

## 2017-03-04 LAB — MAGNESIUM: MAGNESIUM: 2.1 mg/dL (ref 1.7–2.4)

## 2017-03-04 LAB — TSH: TSH: 21.183 u[IU]/mL — ABNORMAL HIGH (ref 0.350–4.500)

## 2017-03-04 LAB — MRSA PCR SCREENING: MRSA by PCR: NEGATIVE

## 2017-03-04 LAB — T4, FREE: Free T4: 1.22 ng/dL — ABNORMAL HIGH (ref 0.61–1.12)

## 2017-03-04 LAB — BRAIN NATRIURETIC PEPTIDE: B Natriuretic Peptide: 1209.6 pg/mL — ABNORMAL HIGH (ref 0.0–100.0)

## 2017-03-04 MED ORDER — INSULIN ASPART 100 UNIT/ML ~~LOC~~ SOLN
0.0000 [IU] | Freq: Three times a day (TID) | SUBCUTANEOUS | Status: DC
  Administered 2017-03-06: 3 [IU] via SUBCUTANEOUS
  Administered 2017-03-07: 2 [IU] via SUBCUTANEOUS
  Administered 2017-03-11: 5 [IU] via SUBCUTANEOUS
  Administered 2017-03-12: 2 [IU] via SUBCUTANEOUS

## 2017-03-04 MED ORDER — INSULIN ASPART 100 UNIT/ML ~~LOC~~ SOLN: 0.0000 [IU] | Freq: Every day | SUBCUTANEOUS | Status: DC

## 2017-03-04 MED ORDER — SODIUM CHLORIDE 0.9% FLUSH: 3.0000 mL | INTRAVENOUS | Status: DC | PRN

## 2017-03-04 MED ORDER — MORPHINE SULFATE (PF) 2 MG/ML IV SOLN
INTRAVENOUS | Status: AC
  Administered 2017-03-04: 2 mg via INTRAVENOUS
  Filled 2017-03-04: qty 1

## 2017-03-04 MED ORDER — RIFAXIMIN 550 MG PO TABS
550.0000 mg | ORAL_TABLET | Freq: Two times a day (BID) | ORAL | Status: DC
  Administered 2017-03-04 – 2017-03-13 (×18): 550 mg via ORAL
  Filled 2017-03-04 (×20): qty 1

## 2017-03-04 MED ORDER — MORPHINE SULFATE (PF) 2 MG/ML IV SOLN
2.0000 mg | Freq: Once | INTRAVENOUS | Status: AC
  Administered 2017-03-04: 2 mg via INTRAVENOUS

## 2017-03-04 MED ORDER — SODIUM CHLORIDE 0.9% FLUSH: 10.0000 mL | INTRAVENOUS | Status: DC | PRN

## 2017-03-04 MED ORDER — ACETAMINOPHEN ER 650 MG PO TBCR: 650.0000 mg | EXTENDED_RELEASE_TABLET | Freq: Three times a day (TID) | ORAL | Status: DC | PRN

## 2017-03-04 MED ORDER — ONDANSETRON HCL 4 MG/2ML IJ SOLN: 4.0000 mg | Freq: Four times a day (QID) | INTRAMUSCULAR | Status: DC | PRN

## 2017-03-04 MED ORDER — ENOXAPARIN SODIUM 40 MG/0.4ML ~~LOC~~ SOLN
40.0000 mg | Freq: Every day | SUBCUTANEOUS | Status: DC
  Administered 2017-03-05 – 2017-03-08 (×2): 40 mg via SUBCUTANEOUS
  Filled 2017-03-04 (×7): qty 0.4

## 2017-03-04 MED ORDER — ATORVASTATIN CALCIUM 40 MG PO TABS
40.0000 mg | ORAL_TABLET | Freq: Every day | ORAL | Status: DC
  Administered 2017-03-04 – 2017-03-12 (×9): 40 mg via ORAL
  Filled 2017-03-04 (×9): qty 1

## 2017-03-04 MED ORDER — SODIUM CHLORIDE 0.9% FLUSH
10.0000 mL | Freq: Two times a day (BID) | INTRAVENOUS | Status: DC
  Administered 2017-03-06 – 2017-03-07 (×2): 10 mL
  Administered 2017-03-07 – 2017-03-08 (×2): 30 mL
  Administered 2017-03-08: 10 mL
  Administered 2017-03-09: 30 mL
  Administered 2017-03-09 – 2017-03-11 (×4): 10 mL
  Administered 2017-03-12 – 2017-03-13 (×2): 20 mL

## 2017-03-04 MED ORDER — ASPIRIN EC 81 MG PO TBEC
81.0000 mg | DELAYED_RELEASE_TABLET | Freq: Every day | ORAL | Status: DC
  Administered 2017-03-05 – 2017-03-13 (×9): 81 mg via ORAL
  Filled 2017-03-04 (×9): qty 1

## 2017-03-04 MED ORDER — LACTULOSE 10 GM/15ML PO SOLN
30.0000 g | Freq: Two times a day (BID) | ORAL | Status: DC
  Administered 2017-03-05 – 2017-03-13 (×17): 30 g via ORAL
  Filled 2017-03-04 (×20): qty 45

## 2017-03-04 MED ORDER — ACETAMINOPHEN 325 MG PO TABS
650.0000 mg | ORAL_TABLET | ORAL | Status: DC | PRN
  Administered 2017-03-06 – 2017-03-10 (×3): 650 mg via ORAL
  Filled 2017-03-04 (×3): qty 2

## 2017-03-04 MED ORDER — FUROSEMIDE 10 MG/ML IJ SOLN
30.0000 mg/h | INTRAVENOUS | Status: AC
  Administered 2017-03-04 – 2017-03-11 (×12): 30 mg/h via INTRAVENOUS
  Filled 2017-03-04 (×33): qty 25

## 2017-03-04 MED ORDER — GLIPIZIDE 10 MG PO TABS
10.0000 mg | ORAL_TABLET | Freq: Two times a day (BID) | ORAL | Status: DC
  Administered 2017-03-04: 10 mg via ORAL
  Filled 2017-03-04: qty 1

## 2017-03-04 MED ORDER — SODIUM CHLORIDE 0.9 % IV SOLN: 250.0000 mL | INTRAVENOUS | Status: DC | PRN

## 2017-03-04 MED ORDER — METOLAZONE 5 MG PO TABS
2.5000 mg | ORAL_TABLET | Freq: Two times a day (BID) | ORAL | Status: DC
  Administered 2017-03-04 – 2017-03-09 (×11): 2.5 mg via ORAL
  Filled 2017-03-04 (×11): qty 1

## 2017-03-04 MED ORDER — SODIUM CHLORIDE 0.9% FLUSH
3.0000 mL | Freq: Two times a day (BID) | INTRAVENOUS | Status: DC
  Administered 2017-03-04 – 2017-03-06 (×4): 3 mL via INTRAVENOUS
  Administered 2017-03-07 – 2017-03-09 (×3): 10 mL via INTRAVENOUS
  Administered 2017-03-10 – 2017-03-11 (×2): 3 mL via INTRAVENOUS

## 2017-03-04 MED ORDER — GABAPENTIN 300 MG PO CAPS
300.0000 mg | ORAL_CAPSULE | Freq: Three times a day (TID) | ORAL | Status: DC
  Administered 2017-03-04 – 2017-03-13 (×28): 300 mg via ORAL
  Filled 2017-03-04 (×28): qty 1

## 2017-03-04 NOTE — Progress Notes (Signed)
Attempted vein cannulation with patient initially complaining with "electricity" feeling with initial attempt of brachial vein.  Needle adjusted and vein cannulated.  Patient no longer was complaining of pain until the introducer was placed.  At that time patient stated that this was hurting but no longer complaining of "electricity" feeling.  PICC was threaded and introducer removed.  Patient was not complaining of "electricity" feeling but did complain of pain.  PICC secured, dressing placed and I had patient move arm to reposition as it had been extended for some time.  While attempting to do this and see if patient's pain was relieved, she told me to "Get your hands off of me and just get out of my room".  I tried to assess if there was still any "electricity' feeling or if that had subsided and movement of arm was decreasing the pain.  Patient stated that she "just want you two to get out".  Spoke with Shanda Bumps, RN bedside nurse who then went to ask patient and she informed her at that time that she was still feeling the "electricity" feeling with arm movement.  Patient did not want me to return to the room and I told Shanda Bumps, RN that we would have another IV nurse come removed the PICC and attempt on the left side if the patient would allow.  Gasper Lloyd, RN VAST

## 2017-03-04 NOTE — Progress Notes (Signed)
Peripherally Inserted Central Catheter/Midline Placement  The IV Nurse has discussed with the patient and/or persons authorized to consent for the patient, the purpose of this procedure and the potential benefits and risks involved with this procedure.  The benefits include less needle sticks, lab draws from the catheter, and the patient may be discharged home with the catheter. Risks include, but not limited to, infection, bleeding, blood clot (thrombus formation), and puncture of an artery; nerve damage and irregular heartbeat and possibility to perform a PICC exchange if needed/ordered by physician.  Alternatives to this procedure were also discussed.  Bard Power PICC patient education guide, fact sheet on infection prevention and patient information card has been provided to patient /or left at bedside.    PICC/Midline Placement Documentation        Billy Turvey, Lajean Manes 03/04/2017, 5:41 PM

## 2017-03-04 NOTE — Progress Notes (Signed)
RN called to patient's room.  Patient found to be in tears and complaining of "lightening bolt" pain in right arm associated with movement. She stated to not allow IV team nurses that placed picc back in her room.  I notified Nash Dimmer RN with IV team.    Next I paged Theodore Demark PA for one time dose of morphine  IV.  Order recieved and placed in to EP[facility].   IV morphine given to patient.

## 2017-03-04 NOTE — Progress Notes (Signed)
Patient instructed to use bedside commode for urine collection and measurement.  Patient stated she had to have 2 BM during the times of her 2 Urine occurrences so she went to the actual bathroom.  Will document occurrences but unable to document output.  Patient again educated on the importance of using the bedside commode with hat for strict I&O's.

## 2017-03-04 NOTE — Progress Notes (Signed)
Carbox drawn and sent to RT

## 2017-03-04 NOTE — Progress Notes (Signed)
[facility]  Patient Status: Active (receiving services up to time of hospitalization)  [facility] is providing the following services: RN and PT  If patient discharges after hours, please call [phone].   Christine Knight 03/04/2017, 10:32 AM

## 2017-03-04 NOTE — Progress Notes (Signed)
Notified Dr. Vonzella Nipple of xray results for picc line placement.  Picc is ok to use now.

## 2017-03-04 NOTE — Progress Notes (Signed)
Requested by Nash Dimmer RN VAST to remove PICC line d/t pt c/o pain and tingling to L arm PICC line placement. Pt tolerated line removal without complications. When assess pt was reporting sensation of "lightening bolts going down arm" tearful. With removal of line, pt reported immediate resolution to sensations and pain. HOB less than 45 degrees for line removal, held breath, with pressure held to site for 5 min, with pressure drsg applied and instructed to remain in bed lying down for . Pt VU. Tomasita Morrow, RN VAST

## 2017-03-04 NOTE — Progress Notes (Signed)
Peripherally Inserted Central Catheter/Midline Placement  The IV Nurse has discussed with the patient and/or persons authorized to consent for the patient, the purpose of this procedure and the potential benefits and risks involved with this procedure.  The benefits include less needle sticks, lab draws from the catheter, and the patient may be discharged home with the catheter. Risks include, but not limited to, infection, bleeding, blood clot (thrombus formation), and puncture of an artery; nerve damage and irregular heartbeat and possibility to perform a PICC exchange if needed/ordered by physician.  Alternatives to this procedure were also discussed.  Bard Power PICC patient education guide, fact sheet on infection prevention and patient information card has been provided to patient /or left at bedside.    PICC/Midline Placement Documentation  PICC Double Lumen 03/04/17 PICC Left Brachial 47 cm 0 cm (Active)  Indication for Insertion or Continuance of Line Vasoactive infusions 03/04/2017  8:39 PM  Exposed Catheter (cm) 0 cm 03/04/2017  8:39 PM  Site Assessment Clean;Dry;Intact 03/04/2017  8:39 PM  Lumen #1 Status Flushed;Saline locked;Blood return noted 03/04/2017  8:39 PM  Lumen #2 Status Flushed;Saline locked;Blood return noted 03/04/2017  8:39 PM  Dressing Type Transparent;Securing device 03/04/2017  8:39 PM  Dressing Status Clean;Dry;Intact;Antimicrobial disc in place 03/04/2017  8:39 PM  Dressing Change Due 03/11/17 03/04/2017  8:39 PM       Romie Jumper 03/04/2017, 8:43 PM

## 2017-03-04 NOTE — Progress Notes (Signed)
[facility]  Patient Status: ACTIVE HRI PT  [facility] is providing the following services: RN  If patient discharges after hours, please call [phone].   Cindie J Sillmon, SW Navigator HRI 03/04/2017, 1:10 PM  [phone]

## 2017-03-04 NOTE — H&P (Signed)
[facility] Team History and Physical Note   Primary Physician:  Arnette Felts Primary Cardiologist:  Dr. Gala Romney   Reason for Admission: Acute on chronic combined regimen  HPI:    Christine Knight is a 66 y.o. female with significant for morbid obesity, Biventricular HF due to ICM (04/30/16 LVEF 15-20%, Grade 2, RV severely dilated and reduced, Mod LAE, Severe RAE, Moderate TI, PA peak pressure 73 mm Hg), CAD with h/o NSTEM s/p CABG Oct 2012, pulmonary HTN, and poorly controlled DM2.  Over past year she has been struggling with recurrent volume overload, prolonged hospitalizations with marked diuresis, then a short period of "wellness".   Most recently seen in HF clinic 02/25/17 and noted to be 27 lbs up in less than a month, with 16 of those lbs being in less than 3 weeks. Stated she had been eating chinese and drinking > 2L daily.  Arranged for 2 doses of IV lasix in the home.  Pt had no relief from these and weight continued to rise.  Also had Labs drawn on 02/28/17 with K of 7. She was called and directed to come to the hospital but was not able to reached until afternoon of 03/03/17.   She presents today for direct admit to the hospital.  State she was urinating "OK" on torsemide, but noticed no increase, and possible a decrease after lasix administration. States she has been watching her fluid and salt intake more closely. States she had greens and water for dinner last night and denies meals high in sodium.  States her breathing is near her baseline. Gets around the house ok but SOB walking around a grocery store. States she has been taking all of her medications as directed. No fever, chills, N/V, or diarrhea. Denies lightheadedness or dizziness.   Review of systems complete and found to be negative unless listed in HPI.    Home Medications Prior to Admission medications   Medication Sig Start Date End Date Taking? Authorizing Provider  acetaminophen (TYLENOL) 650 MG CR  tablet Take 650 mg by mouth every 8 (eight) hours as needed (for arthritic pain).     Historical Provider, MD  aspirin 81 MG tablet Take 1 tablet (81 mg total) by mouth daily. 11/15/13   Luis Abed, MD  atorvastatin (LIPITOR) 40 MG tablet Take 1 tablet (40 mg total) by mouth daily. 11/22/16 02/25/22  [name], PA-C  gabapentin (NEURONTIN) 300 MG capsule Take 300 mg by mouth 3 (three) times daily.     Historical Provider, MD  glipiZIDE (GLUCOTROL) 10 MG tablet Take 10 mg by mouth 2 (two) times daily.    Historical Provider, MD  lactulose (CHRONULAC) 10 GM/15ML solution Take 45 mLs (30 g total) by mouth 2 (two) times daily. 01/30/17   [name], PA-C  Multiple Vitamins-Minerals (CENTRUM SILVER 50+WOMEN PO) Take 1 tablet by mouth every morning.    Historical Provider, MD  potassium chloride SA (K-DUR,KLOR-CON) 20 MEQ tablet Take 1 tablet (20 mEq total) by mouth daily. 02/05/17   Amy D Filbert Schilder, NP  rifaximin (XIFAXAN) 550 MG TABS tablet Take 1 tablet (550 mg total) by mouth 2 (two) times daily. 01/17/17   Little Ishikawa, NP  spironolactone (ALDACTONE) 25 MG tablet Take 1 tablet (25 mg total) by mouth daily. 11/06/16   Dolores Patty, MD  torsemide (DEMADEX) 20 MG tablet Take 4 tablets (80 mg total) by mouth 2 (two) times daily. 01/17/17   Little Ishikawa, NP  Past Medical History: Past Medical History:  Diagnosis Date  . CAD (coronary artery disease) 08/2011   NSTEMI with subsequent CABG 08/13/2011 with LIMA-LAD, SVG-diagonal, SVG-OM, SVG-PDA  . Carpal tunnel syndrome on both sides    "post OHS in 08/2011; resolved now" (03/17/2013)  . CHF (congestive heart failure) (HCC)   . Chronic systolic heart failure (HCC)    Chronic systolic CHF  . Diabetes mellitus type 2 in obese (HCC) 08/10/11  . Dyspnea   . Ejection fraction < 50%    EF 20%, October, 2012  //  EF 25% December, 2012  //   EF 35%, echo, September, 2013  . Hyperlipidemia 08/10/11  . Hypertension   . Ischemic  cardiomyopathy 08/13/2011   EF 20% in 08/2011, still 25% 10/21/2011  . Mitral regurgitation    Mild by echo 10/21/11  . Myocardial infarction (HCC) 08/2011  . Obesity, morbid (HCC)   . Orthopnoea    "progressively worse over last 3 wks" (03/17/2013)  . Pleural effusion    Requiring L thoracentesis 09/02/11  . Pulmonary hypertension (HCC)    Echo, September, 2013, 72 mmHg.    Past Surgical History: Past Surgical History:  Procedure Laterality Date  . CARDIAC CATHETERIZATION N/A 12/13/2015   Procedure: Right/Left Heart Cath and Coronary/Graft Angiography;  Surgeon: Peter M Swaziland, MD;  [facility] INVASIVE CV LAB;  Service: Cardiovascular;  Laterality: N/A;  . CHOLECYSTECTOMY    . CORONARY ARTERY BYPASS GRAFT  08/13/11   CABG x4 with LIMA to LAD, SVG to Diag, SVG to OM, SVG to PDA, EVH via both thighs  . LAPAROSCOPY  1980's?   "removed gallstones; not gallbladder" (03/17/2013)    Family History: Family History  Problem Relation Age of Onset  . Heart disease Father   . Hypertension Maternal Grandmother   . Hypertension Sister   . Cancer Neg Hx   . Diabetes Neg Hx   . Kidney disease Neg Hx     Social History: Social History   Social History  . Marital status: Divorced    Spouse name: N/A  . Number of children: 3  . 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 Smoking status: Former Smoker    Types: Cigarettes    Quit date: 11/11/1978  . Smokeless tobacco: Never Used     Comment: 03/17/2013 "only a social smoker when I did smoke; ever bought any"  . Alcohol use Yes     Comment: OCCASIONAL  . Drug use: No  . Sexual activity: Not Currently    Birth control/ protection: Post-menopausal   Other Topics Concern  . Not on file   Social History Narrative   Lives with her sister and daughter.  Ambulates independently.    Allergies:  Allergies  Allergen Reactions  . Lisinopril Nausea And Vomiting, Other (See Comments) and  Cough    Coughing, throwing up, waking up coughing  . Metformin And Related Anaphylaxis    Objective:    Vital Signs:   Vitals:   03/04/17 1029  BP: 110/71  Pulse: (!) 42  Resp: 16  Temp: 98 F (36.7 C)   Wt Readings from Last 3 Encounters:  02/25/17 237 lb 12.8 oz (107.9 kg)  02/18/17 233 lb (105.7 kg)  02/12/17 223 lb 4.8 oz (101.3 kg)    Physical Exam: General:  Obese AA female in NAD  HEENT: Normal Neck: Supple. JVP to jaw. Carotids 2+ bilat; no  bruits. No lymphadenopathy or thyromegaly appreciated. Cor: PMI non palpable. RRR. No rubs, gallops or murmurs. Lungs: Distant and diminished throughout.  Abdomen: Obese, mildly edematous flanks, NT, ND, no HSM. No bruits or masses. +BS  Extremities: no cyanosis, clubbing, or rash. 3+ edema into thighs.  Neuro: Alert & oriented x 3, cranial nerves grossly intact. moves all 4 extremities w/o difficulty. Affect pleasant.  Telemetry: Not yet connected  Labs: Basic Metabolic Panel: No results for input(s): NA, K, CL, CO2, GLUCOSE, BUN, CREATININE, CALCIUM, MG, PHOS in the last 168 hours.  Liver Function Tests: No results for input(s): AST, ALT, ALKPHOS, BILITOT, PROT, ALBUMIN in the last 168 hours. No results for input(s): LIPASE, AMYLASE in the last 168 hours. No results for input(s): AMMONIA in the last 168 hours.  CBC: No results for input(s): WBC, NEUTROABS, HGB, HCT, MCV, PLT in the last 168 hours.  Cardiac Enzymes: No results for input(s): CKTOTAL, CKMB, CKMBINDEX, TROPONINI in the last 168 hours.  BNP: BNP (last 3 results)  Recent Labs  11/21/16 1550 12/06/16 1008 01/02/17 1507  BNP 1,037.5* 854.6* 827.6*    ProBNP (last 3 results) No results for input(s): PROBNP in the last 8760 hours.   CBG: No results for input(s): GLUCAP in the last 168 hours.  Coagulation Studies: No results for input(s): LABPROT, INR in the last 72 hours.  Other results: EKG: 01/24/17 with NSR 71 bpm  Imaging:  No results  found.   Assessment/Plan   1. 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  Up 30 lbs since last month - Will diurese on lasix gtt at 30 mg/hr with 2.5 mg metolazone BID. May need to add diamox.  - Await labs for K supp. Was previously high as below.  - Place PICC for CVP with marked overload and difficult lab draw.  - Will check Coox once PICC placed in case sending her home with milrinone is best option. Will likely start milrinone to augment diuresis regardless of initial coox.  - Restrict fluid < 1800 mL daily. Daily weights. Strict I/Os.  2. CAD s/p NSTEMI and CABG 10/12 - No chest pain. Overall stable.  - Continue atorvastatin 40 mg daily. Continue ASA 81 mg daily.  3. Morbid obesity - Needs to lose weight. Encouraged portion control and increase activity as able.  4. Pulmonary HTN - Mild to mod RHC 12/13/15 with PVR 3.24. - Diurese.  5. CKD Stage III-IV - Labs pending. Follow closely with diuresis.  6. DM2 - Will order SSI.  7. OSA - Awaiting CPAP at home.  8. Hyperkalemia - K markedly elevated 02/28/17. Labs pending.  9. Hepatic encephalopathy - Continue lactulose and xifaxan.  10. Bradycardia - Noted to be brady into 40s on admit. ? Accuracy => probably due to PVCs. Will order stat EKG.   Pt is again markedly volume overloaded in the setting of Acute on chronic biventricular failure. Will diurese with previous regimen as above. Will check coox prior to starting milrinone to augment diuresis in the event that she qualifies for home inotrope support.   She has had 4 admission in the past 6 months. She has been considered for hospice but family has been very reluctant to agree. Will re-address as appropriate this admission.   Length of Stay: 0  Luane School 03/04/2017, 10:17 AM  Advanced Heart Failure Team Pager [phone] (M-F; 7a - 4p)  Please contact [facility] for night-coverage after hours (4p -7a ) and weekends on [email or web address]  Patient seen with PA, agree with the above note.  She is readmitted with acute on chronic biventricular failure.  Similar hospitalization about a month ago.  Her weight is up about 30 lbs though she does not note much increase in her baseline dyspnea.  Volume overloaded on exam with peripheral edema and JVD.  Creatinine is 1.6, which is approximately at her baseline.   During prior admissions, she has required milrinone and high dose Lasix + metolazone to diurese.  - Place PICC and check baseline co-ox.  Will then begin on milrinone 0.25 mcg/kg/min.  - Start Lasix gtt at 30 mg/hr along with metolazone 2.5 bid (have used this regimen in the past).  - Follow creatinine closely.   Hepatic encephalopathy appears to be controlled, continue lactulose and rifaximin.    Marca Ancona 03/04/2017 1:41 PM

## 2017-03-05 DIAGNOSIS — I50811 Acute right heart failure: Secondary | ICD-10-CM

## 2017-03-05 DIAGNOSIS — N179 Acute kidney failure, unspecified: Secondary | ICD-10-CM

## 2017-03-05 LAB — GLUCOSE, CAPILLARY
GLUCOSE-CAPILLARY: 58 mg/dL — AB (ref 65–99)
GLUCOSE-CAPILLARY: 222 mg/dL — AB (ref 65–99)
Glucose-Capillary: 58 mg/dL — ABNORMAL LOW (ref 65–99)
Glucose-Capillary: 103 mg/dL — ABNORMAL HIGH (ref 65–99)
Glucose-Capillary: 122 mg/dL — ABNORMAL HIGH (ref 65–99)
Glucose-Capillary: 135 mg/dL — ABNORMAL HIGH (ref 65–99)

## 2017-03-05 LAB — BASIC METABOLIC PANEL
Anion gap: 10 (ref 5–15)
BUN: 61 mg/dL — AB (ref 6–20)
CHLORIDE: 95 mmol/L — AB (ref 101–111)
CO2: 28 mmol/L (ref 22–32)
CREATININE: 1.48 mg/dL — AB (ref 0.44–1.00)
Calcium: 8.6 mg/dL — ABNORMAL LOW (ref 8.9–10.3)
GFR, EST AFRICAN AMERICAN: 42 mL/min — AB (ref >60)
GFR, EST NON AFRICAN AMERICAN: 36 mL/min — AB (ref >60)
Glucose, Bld: 70 mg/dL (ref 65–99)
POTASSIUM: 3.6 mmol/L (ref 3.5–5.1)
SODIUM: 133 mmol/L — AB (ref 135–145)

## 2017-03-05 LAB — COOXEMETRY PANEL
CARBOXYHEMOGLOBIN: 1.4 % (ref 0.5–1.5)
Carboxyhemoglobin: 1.2 % (ref 0.5–1.5)
Carboxyhemoglobin: 1.4 % (ref 0.5–1.5)
Carboxyhemoglobin: 1.8 % — ABNORMAL HIGH (ref 0.5–1.5)
METHEMOGLOBIN: 1 % (ref 0.0–1.5)
METHEMOGLOBIN: 1 % (ref 0.0–1.5)
Methemoglobin: 0.7 % (ref 0.0–1.5)
Methemoglobin: 0.8 % (ref 0.0–1.5)
O2 SAT: 45.8 %
O2 SAT: 76.2 %
O2 Saturation: 87.6 %
O2 Saturation: 72.5 %
TOTAL HEMOGLOBIN: 13 g/dL (ref 12.0–16.0)
TOTAL HEMOGLOBIN: 12.5 g/dL (ref 12.0–16.0)
TOTAL HEMOGLOBIN: 12.5 g/dL (ref 12.0–16.0)
Total hemoglobin: 13.9 g/dL (ref 12.0–16.0)

## 2017-03-05 LAB — T3, FREE: T3 FREE: 2.8 pg/mL (ref 2.0–4.4)

## 2017-03-05 MED ORDER — GLIPIZIDE 5 MG PO TABS
5.0000 mg | ORAL_TABLET | Freq: Two times a day (BID) | ORAL | Status: DC
  Administered 2017-03-06 – 2017-03-13 (×15): 5 mg via ORAL
  Filled 2017-03-05 (×16): qty 1

## 2017-03-05 MED ORDER — MILRINONE LACTATE IN DEXTROSE 20-5 MG/100ML-% IV SOLN
0.2500 ug/kg/min | INTRAVENOUS | Status: DC
  Administered 2017-03-05 – 2017-03-10 (×9): 0.25 ug/kg/min via INTRAVENOUS
  Filled 2017-03-05 (×10): qty 100

## 2017-03-05 MED ORDER — LEVOTHYROXINE SODIUM 50 MCG PO TABS
50.0000 ug | ORAL_TABLET | Freq: Every day | ORAL | Status: DC
  Administered 2017-03-06 – 2017-03-13 (×8): 50 ug via ORAL
  Filled 2017-03-05 (×9): qty 1

## 2017-03-05 MED ORDER — GLIPIZIDE 10 MG PO TABS: 10.0000 mg | ORAL_TABLET | Freq: Two times a day (BID) | ORAL | Status: DC

## 2017-03-05 MED ORDER — POTASSIUM CHLORIDE CRYS ER 20 MEQ PO TBCR
20.0000 meq | EXTENDED_RELEASE_TABLET | Freq: Two times a day (BID) | ORAL | Status: DC
  Administered 2017-03-05 – 2017-03-09 (×10): 20 meq via ORAL
  Filled 2017-03-05 (×10): qty 1

## 2017-03-05 NOTE — Progress Notes (Signed)
Results for SAAYA, PROCELL (MRN [id number]) as of 03/05/2017 11:52  Ref. Range 03/04/2017 22:30 03/05/2017 07:35 03/05/2017 07:38 03/05/2017 08:30 03/05/2017 11:13  Glucose-Capillary Latest Ref Range: 65 - 99 mg/dL 409 (H) 58 (L) 58 (L) 811 (H) 122 (H)  Noted that blood sugar this am was 58 mg/dl.  Recommend stopping the Glucotrol if patient continues to have blood sugars less than 70 mg/dl.  Continue Novolog MODERATE correction scale TID & HS.   Smith Mince RN BSN CDE Diabetes Coordinator Pager: [phone]  8am-5pm

## 2017-03-05 NOTE — Progress Notes (Signed)
Blood sugar 58 (and recheck 58) at ~ 730, asymptomatic, patient drank orange juice and blood sugar recheck was 103 at 830.

## 2017-03-05 NOTE — Progress Notes (Signed)
Advanced Heart Failure Rounding Note  PCP: Arnette Felts Primary Cardiologist: Dr. Gala Romney   Subjective:    Admitted with volume overload on 03/04/17. Started on Lasix gtt at /hr + 2.5 metolazone BID.   CVP 24 . Weight down 2 pounds. -2L overnight. Co - ox 45%.   Weak today, tired. Denies chest pain and SOB at rest.   Objective:   Weight Range: 109.2 kg (240 lb 12.8 oz) Body mass index is 38.87 kg/m.   Vital Signs:   Temp:  [97.4 F (36.3 C)-98.3 F (36.8 C)] 98.3 F (36.8 C) (04/25 2050) Pulse Rate:  [55-68] 68 (04/25 2050) Resp:  [18] 18 (04/25 2050) BP: (94-122)/(59-74) 116/59 (04/25 2050) SpO2:  [97 %-100 %] 100 % (04/25 2050) Weight:  [109.2 kg (240 lb 12.8 oz)] 109.2 kg (240 lb 12.8 oz) (04/25 0438) Last BM Date: 03/03/17  Weight change: Filed Weights   03/04/17 1029 03/05/17 0438  Weight: 110 kg (242 lb 9.6 oz) 109.2 kg (240 lb 12.8 oz)    Intake/Output:   Intake/Output Summary (Last 24 hours) at 03/05/17 2304 Last data filed at 03/05/17 1829  Gross per 24 hour  Intake             1521 ml  Output             5450 ml  Net            -3929 ml     Physical Exam: General:  Fatigued appearing female, sitting on side of the bed.  HEENT: normal Neck: supple. JVP to jaw.  . Carotids 2+ bilat; no bruits. No lymphadenopathy or thyromegaly appreciated. Cor: PMI nondisplaced. Regular rate & rhythm. No rubs, gallops or murmurs. Lungs: Crackles in bilateral lower lobes.  Abdomen: soft, nontender, nondistended. No hepatosplenomegaly. No bruits or masses. Good bowel sounds. Extremities: no cyanosis, clubbing, rash. 3+ edema to thighs.  Neuro: alert & orientedx3, cranial nerves grossly intact. moves all 4 extremities w/o difficulty. Affect pleasant   Telemetry: NSR, with frequent PVC's. Rates in the the 70's.   Labs: CBC  Recent Labs  03/04/17 1013  WBC 6.2  NEUTROABS 4.5  HGB 14.5  HCT 44.0  MCV 88.4  PLT 258   Basic Metabolic Panel  Recent  Labs  16/10/96 1013 03/05/17 0541  NA 133* 133*  K 4.8 3.6  CL 98* 95*  CO2 26 28  GLUCOSE 158* 70  BUN 63* 61*  CREATININE 1.67* 1.48*  CALCIUM 8.7* 8.6*  MG 2.1  --    Liver Function Tests  Recent Labs  03/04/17 1013  AST 52*  ALT 25  ALKPHOS 179*  BILITOT 1.2  PROT 6.4*  ALBUMIN 2.8*    BNP: BNP (last 3 results)  Recent Labs  12/06/16 1008 01/02/17 1507 03/04/17 1013  BNP 854.6* 827.6* 1,209.6*   Thyroid Function Tests  Recent Labs  03/04/17 1013 03/04/17 1431  TSH 21.183*  --   T3FREE  --  2.8     Imaging/Studies:  No results found.    Medications:     Scheduled Medications: . aspirin EC  81 mg Oral Daily  . atorvastatin  40 mg Oral q1800  . enoxaparin (LOVENOX) injection  40 mg Subcutaneous Daily  . gabapentin  300 mg Oral TID  . [START ON 03/06/2017] glipiZIDE  5 mg Oral BID WC  . insulin aspart  0-15 Units Subcutaneous TID WC  . insulin aspart  0-5 Units Subcutaneous QHS  . lactulose  30 g Oral BID  . levothyroxine  50 mcg Oral QAC breakfast  . metolazone  2.5 mg Oral BID  . potassium chloride  20 mEq Oral BID  . rifaximin  550 mg Oral BID  . sodium chloride flush  10-40 mL Intracatheter Q12H  . sodium chloride flush  3 mL Intravenous Q12H    Infusions: . sodium chloride    . furosemide (LASIX) infusion 30 mg/hr (03/05/17 1640)  . milrinone 0.25 mcg/kg/min (03/05/17 1722)    PRN Medications: sodium chloride, acetaminophen, ondansetron (ZOFRAN) IV, sodium chloride flush, sodium chloride flush   Assessment/Plan   1. 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  Up 30 lbs since last month - CVP 25.  - Remains volume overloaded on exam. Continue Lasix gtt at /hr. Will get 2 doses of 2.5mg  metolazone today.  - Co ox stable, has required milrinone in the past to enhance diuresis.  2. CAD s/p NSTEMI and CABG 10/12 - Denies  chest pain. - Continue atorvastatin 40 mg daily. Continue ASA 81 mg daily.  3. Morbid obesity - Encouraged to lose weight.  4. Pulmonary HTN - Mild to mod RHC 12/13/15 with PVR 3.24. - Diurese. - No change.   5. CKD Stage III-IV - Creatinine improved today.  - Creatinine 1.67->1.48 - Co2 28.  6. DM2 - Will order SSI.  - Hypoglycemic this am. Will hold her oral hypoglycemics today, will restart at a lower dose tomorrow.  7. OSA - CPAP hs 8. Hyperkalemia - K is 3.6 today,will supplement.  9. Hepatic encephalopathy - Continue lactulose and xifaxan.  - Stable.  10. Bradycardia - No bradycardia on tele.    Length of Stay: 1   Arvilla Meres, MD  03/05/2017, 11:04 PM  Advanced Heart Failure Team Pager [phone] (M-F; 7a - 4p)  Please contact [facility] for night-coverage after hours (4p -7a ) and weekends on [email or web address]  Patient seen and examined with Suzzette Righter, NP. We discussed all aspects of the encounter. I agree with the assessment and plan as stated above.   She has recurrent low output HF with R>>L HF. Sshe is markedly volume overloaded again. Co-ox 47%. We have started milrinone for inotropic support and she is on lasix gtt. Now starting to diurese. Renal function improving slowly. Will supp K and continue diuresis.   Ammonia level elevated but hepatic encephalopathy under better control. Continue Xifaxin and lactulose.   CBGs remain low.Holding oral agents.   Again discussed the severity of her RHF with her and her daughters. Thy are not interested in discussing Palliative Care again at this time.   Arvilla Meres, MD  11:07 PM

## 2017-03-06 LAB — COOXEMETRY PANEL
CARBOXYHEMOGLOBIN: 1.3 % (ref 0.5–1.5)
Methemoglobin: 0.9 % (ref 0.0–1.5)
O2 SAT: 75.2 %
Total hemoglobin: 12.6 g/dL (ref 12.0–16.0)

## 2017-03-06 LAB — BASIC METABOLIC PANEL
ANION GAP: 8 (ref 5–15)
BUN: 56 mg/dL — ABNORMAL HIGH (ref 6–20)
CALCIUM: 8.8 mg/dL — AB (ref 8.9–10.3)
CO2: 32 mmol/L (ref 22–32)
CREATININE: 1.46 mg/dL — AB (ref 0.44–1.00)
Chloride: 92 mmol/L — ABNORMAL LOW (ref 101–111)
GFR calc Af Amer: 42 mL/min — ABNORMAL LOW (ref >60)
GFR, EST NON AFRICAN AMERICAN: 37 mL/min — AB (ref >60)
Glucose, Bld: 185 mg/dL — ABNORMAL HIGH (ref 65–99)
Potassium: 3.6 mmol/L (ref 3.5–5.1)
SODIUM: 132 mmol/L — AB (ref 135–145)

## 2017-03-06 LAB — GLUCOSE, CAPILLARY
GLUCOSE-CAPILLARY: 202 mg/dL — AB (ref 65–99)
GLUCOSE-CAPILLARY: 180 mg/dL — AB (ref 65–99)
GLUCOSE-CAPILLARY: 222 mg/dL — AB (ref 65–99)
Glucose-Capillary: 198 mg/dL — ABNORMAL HIGH (ref 65–99)

## 2017-03-06 LAB — AMMONIA: Ammonia: 85 umol/L — ABNORMAL HIGH (ref 9–35)

## 2017-03-06 NOTE — Progress Notes (Addendum)
Rounded on patient to find patient oob attempting to clean up a large incontinent urine.  Assisted patient at this time, encouraged patient to call for help to prevent falls.  Linen changed, gown changed, floor mopped.

## 2017-03-06 NOTE — Care Management Note (Addendum)
Case Management Note  Patient Details  Name: Christine Knight MRN: [id number] Date of Birth: 06-29-1951  Subjective/Objective:   Pt presented for volume overload. Initiated on IV Lasix gtt and IV Milrinone gtt. Pt is from home and is active with [facility] for [facility] ([facility]) [facility]. Pt will need resumption orders once stable.  The Heart Failure Para-Medicine Team follows patient as well.                Action/Plan: CM will continue to monitor for additional needs.   Expected Discharge Date:                  Expected Discharge Plan:  Home w Home Health Services  In-House Referral:  NA  Discharge planning Services  CM Consult  Post Acute Care Choice:  Home Health, Resumption of Svcs/PTA Provider ([facility] with [facility]. ) Choice offered to:  Patient  DME Arranged:   N/A DME Agency:   N/A  HH Arranged:  RN, PT HH Agency:  [facility]  Status of Service:  Completed.  If discussed at Long Length of Stay Meetings, dates discussed:  03-11-17, 03-13-17  Additional Comments: 1032 03-13-17 Tomi Bamberger, RN,BSN [phone] Pt plan for d/c today- Pt is HRI with [facility] and they are aware of d/c today. No further needs from CM at this time.   1116 03-11-17 Tomi Bamberger, RN,BSN [phone]  Continues on IV Milrinone at a decreased reate. IV Lasix to be stopped-and transition to po torsemide. CM will continue to monitor for additional needs.  Gala Lewandowsky, RN 03/06/2017, 4:18 PM

## 2017-03-06 NOTE — Progress Notes (Signed)
Advanced Heart Failure Rounding Note  PCP: Arnette Felts Primary Cardiologist: Dr. Gala Romney   Subjective:    Admitted with volume overload on 03/04/17. Started on Lasix gtt at /hr + 2.5 metolazone BID.   Co ox down to 45 yesterday, milrinone started. Co ox 75 this am. CVP 17. Weight down 7 pounds from yesterday.   Tired today, did not sleep well last night. Alert and oriented. Denies SOB at rest, chest pain.   Objective:   Weight Range: 233 lb 11.2 oz (106 kg) Body mass index is 37.72 kg/m.   Vital Signs:   Temp:  [97.4 F (36.3 C)-98.3 F (36.8 C)] 98 F (36.7 C) (04/26 0500) Pulse Rate:  [65-135] 135 (04/26 0500) Resp:  [18] 18 (04/26 0500) BP: (94-125)/(51-74) 125/51 (04/26 0500) SpO2:  [96 %-100 %] 96 % (04/26 0500) Weight:  [233 lb 11.2 oz (106 kg)] 233 lb 11.2 oz (106 kg) (04/26 0500) Last BM Date: 03/03/17  Weight change: Filed Weights   03/04/17 1029 03/05/17 0438 03/06/17 0500  Weight: 242 lb 9.6 oz (110 kg) 240 lb 12.8 oz (109.2 kg) 233 lb 11.2 oz (106 kg)    Intake/Output:   Intake/Output Summary (Last 24 hours) at 03/06/17 0742 Last data filed at 03/06/17 0500  Gross per 24 hour  Intake             2001 ml  Output             6400 ml  Net            -4399 ml     Physical Exam: General:  Fatigued appearing female, lying in bed.  HEENT: normal Neck: supple. JVP to jaw. Carotids 2+ bilat; no bruits. No lymphadenopathy or thyromegaly appreciated. Cor: PMI nondisplaced.Regular rate and rhythm. No rubs, gallops or murmurs. Lungs: Crackles in bilateral lower lobes.  Abdomen: soft, nontender, nondistended. No hepatosplenomegaly. No bruits or masses. Good bowel sounds.  Extremities: no cyanosis, clubbing, rash. 3+ edema to knees. Warm  Neuro: alert & orientedx3, cranial nerves grossly intact. moves all 4 extremities w/o difficulty. Affect pleasant   Telemetry: NSR, rates in the 70's. - personally reviewed.   Labs: CBC  Recent Labs   03/04/17 1013  WBC 6.2  NEUTROABS 4.5  HGB 14.5  HCT 44.0  MCV 88.4  PLT 258   Basic Metabolic Panel  Recent Labs  03/04/17 1013 03/05/17 0541 03/06/17 0542  NA 133* 133* 132*  K 4.8 3.6 3.6  CL 98* 95* 92*  CO2 26 28 32  GLUCOSE 158* 70 185*  BUN 63* 61* 56*  CREATININE 1.67* 1.48* 1.46*  CALCIUM 8.7* 8.6* 8.8*  MG 2.1  --   --    Liver Function Tests  Recent Labs  03/04/17 1013  AST 52*  ALT 25  ALKPHOS 179*  BILITOT 1.2  PROT 6.4*  ALBUMIN 2.8*    BNP: BNP (last 3 results)  Recent Labs  12/06/16 1008 01/02/17 1507 03/04/17 1013  BNP 854.6* 827.6* 1,209.6*   Thyroid Function Tests  Recent Labs  03/04/17 1013 03/04/17 1431  TSH 21.183*  --   T3FREE  --  2.8     Imaging/Studies:  No results found.    Medications:     Scheduled Medications: . aspirin EC  81 mg Oral Daily  . atorvastatin  40 mg Oral q1800  . enoxaparin (LOVENOX) injection  40 mg Subcutaneous Daily  . gabapentin  300 mg Oral TID  . glipiZIDE  5 mg Oral BID WC  . insulin aspart  0-15 Units Subcutaneous TID WC  . insulin aspart  0-5 Units Subcutaneous QHS  . lactulose  30 g Oral BID  . levothyroxine  50 mcg Oral QAC breakfast  . metolazone  2.5 mg Oral BID  . potassium chloride  20 mEq Oral BID  . rifaximin  550 mg Oral BID  . sodium chloride flush  10-40 mL Intracatheter Q12H  . sodium chloride flush  3 mL Intravenous Q12H    Infusions: . sodium chloride    . furosemide (LASIX) infusion 30 mg/hr (03/06/17 0203)  . milrinone 0.25 mcg/kg/min (03/06/17 0259)    PRN Medications: sodium chloride, acetaminophen, ondansetron (ZOFRAN) IV, sodium chloride flush, sodium chloride flush   Assessment/Plan   1. 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  - CVP 17  - Remains volume overloaded on exam. Continue Lasix gtt at /hr. - Continue metolazone 2.5mg  BID.   2. CAD s/p NSTEMI and  CABG 10/12 - Denies chest pain. - Continue atorvastatin 40 mg daily. Continue ASA 81 mg daily. - No change to current plan   3. Morbid obesity - Encouraged to lose weight.  - no change to current plan  4. Pulmonary HTN - Mild to mod RHC 12/13/15 with PVR 3.24. - Diurese. - No change to current plan.  5. CKD Stage III-IV  - Creatinine 1.67->1.48->1.46 - Stable.  6. DM2 - Continue SSI.   - CBG's in the 200's.  7. OSA - CPAP hs - No change.  8. Hyperkalemia - K is 3.6 today - Getting supplementation.   9. Hepatic encephalopathy - Continue lactulose and xifaxan.  - Get ammonia level today.  10. Bradycardia - No bradycardia on tele.  - Stable, no changes.   Length of Stay: 2   Little Ishikawa, NP  03/06/2017, 7:42 AM  Advanced Heart Failure Team  Pager [phone] M-F 7am-4pm.  Please contact [facility] for night-coverage after hours (4p -7a ) and weekends on [email or web address]   Patient seen and examined with Suzzette Righter, NP. We discussed all aspects of the encounter. I agree with the assessment and plan as stated above.   Remains very tenuous. Markedly volume overloaded. Co-ox was low so now on milrinone. Co-ox improving. Weight down 7 pounds. Continue IV lasix and milrinone. Supp K+. Watch renal function closely given h/o cardiorenal syndrome. Hepatic encephalopathy stable. Continue current regimen.   Arvilla Meres, MD  2:41 PM

## 2017-03-07 ENCOUNTER — Other Ambulatory Visit (HOSPITAL_COMMUNITY): Payer: Self-pay | Admitting: Internal Medicine

## 2017-03-07 DIAGNOSIS — K7290 Hepatic failure, unspecified without coma: Secondary | ICD-10-CM

## 2017-03-07 DIAGNOSIS — I2721 Secondary pulmonary arterial hypertension: Secondary | ICD-10-CM

## 2017-03-07 DIAGNOSIS — E876 Hypokalemia: Secondary | ICD-10-CM

## 2017-03-07 LAB — COOXEMETRY PANEL
CARBOXYHEMOGLOBIN: 1.5 % (ref 0.5–1.5)
Methemoglobin: 1.1 % (ref 0.0–1.5)
O2 SAT: 70.2 %
TOTAL HEMOGLOBIN: 12.6 g/dL (ref 12.0–16.0)

## 2017-03-07 LAB — BASIC METABOLIC PANEL
ANION GAP: 8 (ref 5–15)
Anion gap: 7 (ref 5–15)
BUN: 50 mg/dL — ABNORMAL HIGH (ref 6–20)
BUN: 46 mg/dL — AB (ref 6–20)
CALCIUM: 8.1 mg/dL — AB (ref 8.9–10.3)
CHLORIDE: 91 mmol/L — AB (ref 101–111)
CO2: 32 mmol/L (ref 22–32)
CO2: 34 mmol/L — AB (ref 22–32)
Calcium: 8.6 mg/dL — ABNORMAL LOW (ref 8.9–10.3)
Chloride: 87 mmol/L — ABNORMAL LOW (ref 101–111)
Creatinine, Ser: 1.4 mg/dL — ABNORMAL HIGH (ref 0.44–1.00)
Creatinine, Ser: 1.36 mg/dL — ABNORMAL HIGH (ref 0.44–1.00)
GFR calc Af Amer: 45 mL/min — ABNORMAL LOW (ref >60)
GFR calc Af Amer: 46 mL/min — ABNORMAL LOW (ref >60)
GFR calc non Af Amer: 38 mL/min — ABNORMAL LOW (ref >60)
GFR, EST NON AFRICAN AMERICAN: 40 mL/min — AB (ref >60)
GLUCOSE: 275 mg/dL — AB (ref 65–99)
Glucose, Bld: 67 mg/dL (ref 65–99)
POTASSIUM: 3 mmol/L — AB (ref 3.5–5.1)
Potassium: 3 mmol/L — ABNORMAL LOW (ref 3.5–5.1)
SODIUM: 131 mmol/L — AB (ref 135–145)
Sodium: 128 mmol/L — ABNORMAL LOW (ref 135–145)

## 2017-03-07 LAB — GLUCOSE, CAPILLARY
GLUCOSE-CAPILLARY: 72 mg/dL (ref 65–99)
GLUCOSE-CAPILLARY: 115 mg/dL — AB (ref 65–99)
GLUCOSE-CAPILLARY: 170 mg/dL — AB (ref 65–99)
Glucose-Capillary: 145 mg/dL — ABNORMAL HIGH (ref 65–99)

## 2017-03-07 LAB — MAGNESIUM: Magnesium: 2 mg/dL (ref 1.7–2.4)

## 2017-03-07 MED ORDER — POTASSIUM CHLORIDE CRYS ER 20 MEQ PO TBCR
40.0000 meq | EXTENDED_RELEASE_TABLET | Freq: Once | ORAL | Status: AC
  Administered 2017-03-07: 40 meq via ORAL
  Filled 2017-03-07: qty 2

## 2017-03-07 NOTE — Progress Notes (Signed)
Advanced Heart Failure Rounding Note  PCP: Arnette Felts Primary Cardiologist: Dr. Gala Romney   Subjective:    Admitted with volume overload on 03/04/17. Started on Lasix gtt at /hr + 2.5 metolazone BID. Started milrinone 0.53mcg on 03/05/17.   Co ox 70 this am. CVP 20. Weight down 15 pounds total. Creatinine stable at 1.4.   Feels better today, still SOB with Christine activity. Denies chest pain, dizziness.   Objective:   Weight Range: 227 lb 12.8 oz (103.3 kg) Body mass index is 36.77 kg/m.   Vital Signs:   Temp:  [98 F (36.7 C)-98.5 F (36.9 C)] 98.5 F (36.9 C) (04/27 0418) Pulse Rate:  [70-77] 77 (04/27 0418) Resp:  [14-18] 16 (04/27 0418) BP: (103-111)/(51-61) 103/56 (04/27 0418) SpO2:  [96 %-100 %] 97 % (04/27 0418) Weight:  [227 lb 12.8 oz (103.3 kg)] 227 lb 12.8 oz (103.3 kg) (04/27 0418) Last BM Date: 03/06/17  Weight change: Filed Weights   03/05/17 0438 03/06/17 0500 03/07/17 0418  Weight: 240 lb 12.8 oz (109.2 kg) 233 lb 11.2 oz (106 kg) 227 lb 12.8 oz (103.3 kg)    Intake/Output:   Intake/Output Summary (Last 24 hours) at 03/07/17 0727 Last data filed at 03/07/17 0423  Gross per 24 hour  Intake              733 ml  Output             3850 ml  Net            -3117 ml     Physical Exam: General: Fatigued appearing female, NAD.  HEENT: normal Neck: supple. JVD to jaw.  Carotids 2+ bilat; no bruits. No thyromegaly or nodule noted. Cor: PMI nondisplaced. RRR, No M/G/R noted Lungs: CTAB, normal effort. Abdomen: soft, non-tender, distended, no HSM. No bruits or masses. +BS  Extremities: no cyanosis, clubbing, rash, R and LLE. 3+ pitting edema to knees.  Neuro: alert & orientedx3, cranial nerves grossly intact. moves all 4 extremities w/o difficulty. Affect pleasant    Telemetry: NSR, rates in the 70's. - personally reviewed.   Labs: CBC  Recent Labs  03/04/17 1013  WBC 6.2  NEUTROABS 4.5  HGB 14.5  HCT 44.0  MCV 88.4  PLT 258    Basic Metabolic Panel  Recent Labs  03/04/17 1013  03/06/17 0542 03/07/17 0440  NA 133*  < > 132* 131*  K 4.8  < > 3.6 3.0*  CL 98*  < > 92* 91*  CO2 26  < > 32 32  GLUCOSE 158*  < > 185* 67  BUN 63*  < > 56* 50*  CREATININE 1.67*  < > 1.46* 1.40*  CALCIUM 8.7*  < > 8.8* 8.6*  MG 2.1  --   --   --   < > = values in this interval not displayed. Liver Function Tests  Recent Labs  03/04/17 1013  AST 52*  ALT 25  ALKPHOS 179*  BILITOT 1.2  PROT 6.4*  ALBUMIN 2.8*    BNP: BNP (last 3 results)  Recent Labs  12/06/16 1008 01/02/17 1507 03/04/17 1013  BNP 854.6* 827.6* 1,209.6*   Thyroid Function Tests  Recent Labs  03/04/17 1013 03/04/17 1431  TSH 21.183*  --   T3FREE  --  2.8     Imaging/Studies:  No results found.    Medications:     Scheduled Medications: . aspirin EC  81 mg Oral Daily  . atorvastatin  40  mg Oral q1800  . enoxaparin (LOVENOX) injection  40 mg Subcutaneous Daily  . gabapentin  300 mg Oral TID  . glipiZIDE  5 mg Oral BID WC  . insulin aspart  0-15 Units Subcutaneous TID WC  . insulin aspart  0-5 Units Subcutaneous QHS  . lactulose  30 g Oral BID  . levothyroxine  50 mcg Oral QAC breakfast  . metolazone  2.5 mg Oral BID  . potassium chloride  20 mEq Oral BID  . rifaximin  550 mg Oral BID  . sodium chloride flush  10-40 mL Intracatheter Q12H  . sodium chloride flush  3 mL Intravenous Q12H    Infusions: . sodium chloride    . furosemide (LASIX) infusion 30 mg/hr (03/07/17 0657)  . milrinone 0.25 mcg/kg/min (03/07/17 0220)    PRN Medications: sodium chloride, acetaminophen, ondansetron (ZOFRAN) IV, sodium chloride flush, sodium chloride flush   Assessment/Plan   1. Acute on chronic biventricular HF due to ICM EF 15-20% per echo 04/14/16 - Continue milrinone at 0.36mcg.  - CVP 17  - Continue Lasix gtt at /hr. Remains volume overloaded on exam.  - Continue metolazone 2.5mg  BID.   2. CAD s/p NSTEMI and CABG  10/12 - Denies chest pain. - Continue atorvastatin 40 mg daily. Continue ASA 81 mg daily. - No change to current plan.  3. Morbid obesity - Encouraged to lose weight.  - Talked about her diet choices today, continues to eat high sodium foods.  4. Pulmonary HTN - Mild to mod RHC 12/13/15 with PVR 3.24. - Diurese. - No change to current plan.  5. CKD Stage III-IV  - Creatinine 1.67->1.48->1.46-> - Remains elevated but improving with diuresis.  6. DM2 - Continue SSI.   - CBG's in the 200's.  - Continue current management.  7. OSA - CPAP hs - Continue current management.  8. Hyperkalemia - K is 3.0 today, will give an extra this am  9. Hepatic encephalopathy - Continue lactulose and xifaxan.  - ammonia 85.  10. Bradycardia - No bradycardia on tele.  - Stable, no changes.    Length of Stay: 3   Christine Ishikawa, NP  03/07/2017, 7:27 AM  Advanced Heart Failure Team  Pager [phone] M-F 7am-4pm.  Please contact [facility] for night-coverage after hours (4p -7a ) and weekends on [email or web address]   Patient seen and examined with Suzzette Righter, NP. We discussed all aspects of the encounter. I agree with the assessment and plan as stated above.   She continues to diurese well on milrinone and lasix gtt. Co-ox 70% CVP 20. Will continue current drips. Renal function stable. K is low. Will replete. Place UNNA boots.  Ammonia level 85. No s/s of hepatic encephalopathy. Continue lactulose/xifaxin. Will follow ammonia levels.   Arvilla Meres, MD  5:04 PM

## 2017-03-07 NOTE — Care Management Important Message (Signed)
Important Message  Patient Details  Name: Christine Knight MRN: [id number] Date of Birth: November 27, 1950   Medicare Important Message Given:  Yes    Kyla Balzarine 03/07/2017, 2:26 PM

## 2017-03-08 LAB — BASIC METABOLIC PANEL
Anion gap: 10 (ref 5–15)
BUN: 45 mg/dL — ABNORMAL HIGH (ref 6–20)
CALCIUM: 8.8 mg/dL — AB (ref 8.9–10.3)
CO2: 34 mmol/L — ABNORMAL HIGH (ref 22–32)
CREATININE: 1.39 mg/dL — AB (ref 0.44–1.00)
Chloride: 84 mmol/L — ABNORMAL LOW (ref 101–111)
GFR calc non Af Amer: 39 mL/min — ABNORMAL LOW (ref >60)
GFR, EST AFRICAN AMERICAN: 45 mL/min — AB (ref >60)
Glucose, Bld: 234 mg/dL — ABNORMAL HIGH (ref 65–99)
Potassium: 3.2 mmol/L — ABNORMAL LOW (ref 3.5–5.1)
SODIUM: 128 mmol/L — AB (ref 135–145)

## 2017-03-08 LAB — GLUCOSE, CAPILLARY
GLUCOSE-CAPILLARY: 187 mg/dL — AB (ref 65–99)
Glucose-Capillary: 167 mg/dL — ABNORMAL HIGH (ref 65–99)
Glucose-Capillary: 197 mg/dL — ABNORMAL HIGH (ref 65–99)
Glucose-Capillary: 203 mg/dL — ABNORMAL HIGH (ref 65–99)

## 2017-03-08 LAB — MAGNESIUM: MAGNESIUM: 2.2 mg/dL (ref 1.7–2.4)

## 2017-03-08 LAB — COOXEMETRY PANEL
CARBOXYHEMOGLOBIN: 1.6 % — AB (ref 0.5–1.5)
METHEMOGLOBIN: 1 % (ref 0.0–1.5)
O2 SAT: 70.9 %
Total hemoglobin: 12.7 g/dL (ref 12.0–16.0)

## 2017-03-08 NOTE — Progress Notes (Signed)
    Subjective:  Denies SSCP, palpitations or Dyspnea   Objective:  Vitals:   03/07/17 2030 03/08/17 0022 03/08/17 0500 03/08/17 0817  BP: 118/67 (!) 103/58 122/76 115/62  Pulse: 76 62 77 71  Resp: Temp: 98.7 F (37.1 C) 98.1 F (36.7 C) 98.6 F (37 C) 98.2 F (36.8 C)  TempSrc: Oral Oral Oral Oral  SpO2: 99% 93% 96% 97%  Weight:   219 lb (99.3 kg)   Height:        Intake/Output from previous day:  Intake/Output Summary (Last 24 hours) at 03/08/17 0940 Last data filed at 03/08/17 0900  Gross per 24 hour  Intake            948.4 ml  Output             3300 ml  Net          -2351.6 ml    Physical Exam: Affect appropriate Chronically ill black female  HEENT: normal Neck supple with no adenopathy JVP elevated  no bruits no thyromegaly Lungs clear with no wheezing and good diaphragmatic motion Heart:  S1/S2 S3  no murmur, no rub, gallop or click PMI normal Abdomen: benighn, BS positve, no tenderness, no AAA no bruit.  No HSM or HJR Distal pulses intact with no bruits Plus 2 edema bilateral with una boots on  Neuro non-focal Skin warm and dry No muscular weakness   Lab Results: Basic Metabolic Panel:  Recent Labs  24/40/10 1428 03/08/17 0605  NA 128* 128*  K 3.0* 3.2*  CL 87* 84*  CO2 34* 34*  GLUCOSE 275* 234*  BUN 46* 45*  CREATININE 1.36* 1.39*  CALCIUM 8.1* 8.8*  MG 2.0 2.2    Imaging: No results found.  Cardiac Studies:  ECG: junctional low voltage PVC    Telemetry:SR PVC rates 80-90 03/08/2017    Echo: EF 15-20%    Medications:   . aspirin EC  81 mg Oral Daily  . atorvastatin  40 mg Oral q1800  . enoxaparin (LOVENOX) injection  40 mg Subcutaneous Daily  . gabapentin  300 mg Oral TID  . glipiZIDE  5 mg Oral BID WC  . insulin aspart  0-15 Units Subcutaneous TID WC  . insulin aspart  0-5 Units Subcutaneous QHS  . lactulose  30 g Oral BID  . levothyroxine  50 mcg Oral QAC breakfast  . metolazone  2.5 mg Oral BID  .  potassium chloride  20 mEq Oral BID  . rifaximin  550 mg Oral BID  . sodium chloride flush  10-40 mL Intracatheter Q12H  . sodium chloride flush  3 mL Intravenous Q12H     . sodium chloride    . furosemide (LASIX) infusion 30 mg/hr (03/07/17 0657)  . milrinone 0.25 mcg/kg/min (03/08/17 2725)    Assessment/Plan:  CHF:  Continue lasix drip and milrinone Coox staying over 70% with good diuresis still volume Overloaded on exam Cr is stable   CAD CABG 10/12 no angina stable continue statin and ASA  CKD:  Cr stable with diuresis  Lab Results  Component Value Date   CREATININE 1.39 (H) 03/08/2017   BUN 45 (H) 03/08/2017   NA 128 (L) 03/08/2017   K 3.2 (L) 03/08/2017   CL 84 (L) 03/08/2017   CO2 34 (H) 03/08/2017   Hepatic Encephalopathy:  No clinical signs continue lactulose and xifaxan    Charlton Haws 03/08/2017, 9:40 AM

## 2017-03-08 NOTE — Progress Notes (Signed)
CVP taken = 14

## 2017-03-09 LAB — BASIC METABOLIC PANEL
Anion gap: 10 (ref 5–15)
BUN: 43 mg/dL — ABNORMAL HIGH (ref 6–20)
CALCIUM: 8.5 mg/dL — AB (ref 8.9–10.3)
CO2: 36 mmol/L — ABNORMAL HIGH (ref 22–32)
CREATININE: 1.38 mg/dL — AB (ref 0.44–1.00)
Chloride: 81 mmol/L — ABNORMAL LOW (ref 101–111)
GFR, EST AFRICAN AMERICAN: 45 mL/min — AB (ref >60)
GFR, EST NON AFRICAN AMERICAN: 39 mL/min — AB (ref >60)
GLUCOSE: 255 mg/dL — AB (ref 65–99)
Potassium: 2.8 mmol/L — ABNORMAL LOW (ref 3.5–5.1)
Sodium: 127 mmol/L — ABNORMAL LOW (ref 135–145)

## 2017-03-09 LAB — COOXEMETRY PANEL
CARBOXYHEMOGLOBIN: 1.7 % — AB (ref 0.5–1.5)
METHEMOGLOBIN: 1.1 % (ref 0.0–1.5)
O2 SAT: 68.7 %
Total hemoglobin: 11.8 g/dL — ABNORMAL LOW (ref 12.0–16.0)

## 2017-03-09 LAB — GLUCOSE, CAPILLARY
GLUCOSE-CAPILLARY: 148 mg/dL — AB (ref 65–99)
Glucose-Capillary: 110 mg/dL — ABNORMAL HIGH (ref 65–99)
Glucose-Capillary: 149 mg/dL — ABNORMAL HIGH (ref 65–99)
Glucose-Capillary: 156 mg/dL — ABNORMAL HIGH (ref 65–99)

## 2017-03-09 MED ORDER — POTASSIUM CHLORIDE CRYS ER 20 MEQ PO TBCR
40.0000 meq | EXTENDED_RELEASE_TABLET | Freq: Once | ORAL | Status: AC
Start: 2017-03-09 — End: 2017-03-09
  Administered 2017-03-09: 40 meq via ORAL
  Filled 2017-03-09: qty 2

## 2017-03-09 NOTE — Progress Notes (Signed)
Subjective:  Denies SSCP, palpitations or Dyspnea   Objective:  Vitals:   03/08/17 1900 03/09/17 0100 03/09/17 0500 03/09/17 0751  BP: (!) 104/56 (!) 107/57 (!) 114/51 107/67  Pulse: 79 93 86 (!) 104  Resp: Temp: 98.4 F (36.9 C)  98.6 F (37 C) 98.2 F (36.8 C)  TempSrc: Oral  Oral Oral  SpO2: 97% 96% 95% 96%  Weight:   96.6 kg (212 lb 14.4 oz)   Height:        Intake/Output from previous day:  Intake/Output Summary (Last 24 hours) at 03/09/17 1002 Last data filed at 03/09/17 0900  Gross per 24 hour  Intake              720 ml  Output             6150 ml  Net            -5430 ml    Physical Exam: Affect appropriate Chronically ill black female  HEENT: normal Neck supple with no adenopathy JVP elevated  no bruits no thyromegaly Lungs clear with no wheezing and good diaphragmatic motion Heart:  S1/S2 S3  no murmur, no rub, gallop or click PMI normal Abdomen: benighn, BS positve, no tenderness, no AAA no bruit.  No HSM or HJR Distal pulses intact with no bruits Plus 2 edema bilateral with una boots on  Neuro non-focal Skin warm and dry No muscular weakness   Lab Results: Basic Metabolic Panel:  Recent Labs  16/10/96 1428 03/08/17 0605 03/09/17 0540  NA 128* 128* 127*  K 3.0* 3.2* 2.8*  CL 87* 84* 81*  CO2 34* 34* 36*  GLUCOSE 275* 234* 255*  BUN 46* 45* 43*  CREATININE 1.36* 1.39* 1.38*  CALCIUM 8.1* 8.8* 8.5*  MG 2.0 2.2  --     Imaging: No results found.  Cardiac Studies:  ECG: junctional low voltage PVC    Telemetry:SR PVC rates 80-90 03/09/2017    Echo: EF 15-20%    Medications:   . aspirin EC  81 mg Oral Daily  . atorvastatin  40 mg Oral q1800  . enoxaparin (LOVENOX) injection  40 mg Subcutaneous Daily  . gabapentin  300 mg Oral TID  . glipiZIDE  5 mg Oral BID WC  . insulin aspart  0-15 Units Subcutaneous TID WC  . insulin aspart  0-5 Units Subcutaneous QHS  . lactulose  30 g Oral BID  . levothyroxine  50 mcg  Oral QAC breakfast  . metolazone  2.5 mg Oral BID  . potassium chloride  20 mEq Oral BID  . rifaximin  550 mg Oral BID  . sodium chloride flush  10-40 mL Intracatheter Q12H  . sodium chloride flush  3 mL Intravenous Q12H     . sodium chloride    . furosemide (LASIX) infusion 30 mg/hr (03/08/17 2320)  . milrinone 0.25 mcg/kg/min (03/09/17 0553)    Assessment/Plan:  CHF:  Continue lasix drip and milrinone Coox staying over 68%  with good diuresis still volume Overloaded on exam Cr is stable CVP 14   CAD CABG 10/12 no angina stable continue statin and ASA  CKD:  Cr stable with diuresis  Lab Results  Component Value Date   CREATININE 1.38 (H) 03/09/2017   BUN 43 (H) 03/09/2017   NA 127 (L) 03/09/2017   K 2.8 (L) 03/09/2017   CL 81 (L) 03/09/2017   CO2 36 (H) 03/09/2017   Hepatic Encephalopathy:  No clinical signs continue lactulose and xifaxan    Charlton Haws 03/09/2017, 10:02 AM

## 2017-03-10 LAB — BASIC METABOLIC PANEL
ANION GAP: 14 (ref 5–15)
ANION GAP: 10 (ref 5–15)
BUN: 39 mg/dL — AB (ref 6–20)
BUN: 37 mg/dL — AB (ref 6–20)
CALCIUM: 8.9 mg/dL (ref 8.9–10.3)
CALCIUM: 8.6 mg/dL — AB (ref 8.9–10.3)
CO2: 36 mmol/L — ABNORMAL HIGH (ref 22–32)
CO2: 37 mmol/L — ABNORMAL HIGH (ref 22–32)
CREATININE: 1.28 mg/dL — AB (ref 0.44–1.00)
CREATININE: 1.27 mg/dL — AB (ref 0.44–1.00)
Chloride: 78 mmol/L — ABNORMAL LOW (ref 101–111)
Chloride: 77 mmol/L — ABNORMAL LOW (ref 101–111)
GFR calc Af Amer: 50 mL/min — ABNORMAL LOW (ref >60)
GFR calc Af Amer: 50 mL/min — ABNORMAL LOW (ref >60)
GFR calc non Af Amer: 43 mL/min — ABNORMAL LOW (ref >60)
GFR, EST NON AFRICAN AMERICAN: 43 mL/min — AB (ref >60)
GLUCOSE: 220 mg/dL — AB (ref 65–99)
GLUCOSE: 334 mg/dL — AB (ref 65–99)
Potassium: 2.7 mmol/L — CL (ref 3.5–5.1)
Potassium: 3.1 mmol/L — ABNORMAL LOW (ref 3.5–5.1)
Sodium: 128 mmol/L — ABNORMAL LOW (ref 135–145)
Sodium: 124 mmol/L — ABNORMAL LOW (ref 135–145)

## 2017-03-10 LAB — MAGNESIUM: MAGNESIUM: 2.1 mg/dL (ref 1.7–2.4)

## 2017-03-10 LAB — COOXEMETRY PANEL
CARBOXYHEMOGLOBIN: 1.8 % — AB (ref 0.5–1.5)
METHEMOGLOBIN: 1 % (ref 0.0–1.5)
O2 Saturation: 69.8 %
TOTAL HEMOGLOBIN: 12.1 g/dL (ref 12.0–16.0)

## 2017-03-10 LAB — GLUCOSE, CAPILLARY
GLUCOSE-CAPILLARY: 74 mg/dL (ref 65–99)
Glucose-Capillary: 184 mg/dL — ABNORMAL HIGH (ref 65–99)
Glucose-Capillary: 213 mg/dL — ABNORMAL HIGH (ref 65–99)
Glucose-Capillary: 214 mg/dL — ABNORMAL HIGH (ref 65–99)

## 2017-03-10 MED ORDER — MILRINONE LACTATE IN DEXTROSE 20-5 MG/100ML-% IV SOLN
0.2500 ug/kg/min | INTRAVENOUS | Status: DC
  Administered 2017-03-10: 0.25 ug/kg/min via INTRAVENOUS
  Filled 2017-03-10: qty 100

## 2017-03-10 MED ORDER — POTASSIUM CHLORIDE CRYS ER 20 MEQ PO TBCR
40.0000 meq | EXTENDED_RELEASE_TABLET | Freq: Three times a day (TID) | ORAL | Status: AC
  Administered 2017-03-10 (×3): 40 meq via ORAL
  Filled 2017-03-10 (×3): qty 2

## 2017-03-10 NOTE — Progress Notes (Signed)
Advanced Heart Failure Rounding Note  PCP: Arnette Felts Primary Cardiologist: Dr. Gala Romney   Subjective:    Admitted with volume overload on 03/04/17. Started on Lasix gtt at /hr + 2.5 metolazone BID. Started milrinone 0.66mcg on 03/05/17.   Co ox stable at 70. CVP 10 . Weight down 36 pounds total. Creatinine stable.   Feels well today. Sitting up on the side of the bed. Denies SOB, chest pain. No dizziness with walking.   Objective:   Weight Range: 206 lb 9.6 oz (93.7 kg) Body mass index is 33.35 kg/m.   Vital Signs:   Temp:  [97.4 F (36.3 C)-98.5 F (36.9 C)] 98.5 F (36.9 C) (04/30 0739) Pulse Rate:  [72-84] 84 (04/30 0739) Resp:  [18-19] 18 (04/30 0739) BP: (98-113)/(54-70) 113/70 (04/30 0739) SpO2:  [96 %-98 %] 98 % (04/30 0739) Weight:  [206 lb 9.6 oz (93.7 kg)] 206 lb 9.6 oz (93.7 kg) (04/30 0300) Last BM Date: 03/09/17  Weight change: Filed Weights   03/08/17 0500 03/09/17 0500 03/10/17 0300  Weight: 219 lb (99.3 kg) 212 lb 14.4 oz (96.6 kg) 206 lb 9.6 oz (93.7 kg)    Intake/Output:   Intake/Output Summary (Last 24 hours) at 03/10/17 0753 Last data filed at 03/10/17 0742  Gross per 24 hour  Intake              240 ml  Output             6800 ml  Net            -6560 ml     Physical Exam: General:female, NAD. Sitting on side of bed eating breakfast.  HEENT: normal Neck: supple. JVP 9-10 cm.  Carotids 2+ bilat; no bruits. No thyromegaly or nodule noted. Cor: PMI nondisplaced. Regular rate and rhythm. No murmurs, rubs or gallops. Lungs: Clear to auscultation bilaterally. No wheezing.  Abdomen: soft, non-tender, distended, no HSM. No bruits or masses. + bowel sounds.  Extremities: no cyanosis, clubbing, rash. Bilateral lower extremities with unna boots in place.  Neuro: alert & orientedx3, cranial nerves grossly intact. moves all 4 extremities w/o difficulty.  Affect pleasant.     Telemetry: NSR - rates in the 80's   Labs: Basic Metabolic  Panel  Recent Labs  16/10/96 1428 03/08/17 0605 03/09/17 0540 03/10/17 0525  NA 128* 128* 127* 128*  K 3.0* 3.2* 2.8* 2.7*  CL 87* 84* 81* 78*  CO2 34* 34* 36* 36*  GLUCOSE 275* 234* 255* 220*  BUN 46* 45* 43* 39*  CREATININE 1.36* 1.39* 1.38* 1.28*  CALCIUM 8.1* 8.8* 8.5* 8.9  MG 2.0 2.2  --   --     BNP: BNP (last 3 results)  Recent Labs  12/06/16 1008 01/02/17 1507 03/04/17 1013  BNP 854.6* 827.6* 1,209.6*       Medications:     Scheduled Medications: . aspirin EC  81 mg Oral Daily  . atorvastatin  40 mg Oral q1800  . enoxaparin (LOVENOX) injection  40 mg Subcutaneous Daily  . gabapentin  300 mg Oral TID  . glipiZIDE  5 mg Oral BID WC  . insulin aspart  0-15 Units Subcutaneous TID WC  . insulin aspart  0-5 Units Subcutaneous QHS  . lactulose  30 g Oral BID  . levothyroxine  50 mcg Oral QAC breakfast  . metolazone  2.5 mg Oral BID  . potassium chloride  20 mEq Oral BID  . rifaximin  550 mg Oral BID  .  sodium chloride flush  10-40 mL Intracatheter Q12H  . sodium chloride flush  3 mL Intravenous Q12H    Infusions: . sodium chloride    . furosemide (LASIX) infusion 30 mg/hr (03/10/17 0159)  . milrinone 0.25 mcg/kg/min (03/09/17 1900)    PRN Medications: sodium chloride, acetaminophen, ondansetron (ZOFRAN) IV, sodium chloride flush, sodium chloride flush   Assessment/Plan   1. 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  - CVP 10, urine output 6L yesterday.  - Continue IV lasix gtt at /hr today.  - Stop metolazone today.   2. CAD s/p NSTEMI and CABG 10/12 - Denies chest pain. - Continue atorvastatin 40 mg daily. Continue ASA 81 mg daily. - no change to current plan.  3. Morbid obesity - Encouraged to lose weight.  - Reinforced dietary limitations today.  4. Pulmonary HTN - Mild to mod RHC 12/13/15 with PVR 3.24. - Continue to diurese.  - No change to current plan.  5. CKD Stage  III-IV  - Creatinine stable.  - No change to current plan.  6. DM2 - Continue SSI.   - CBG's improving. - Continue current medical management.  7. OSA - CPAP hs - No change to current plan.   8. Hypokalemia - K 2.7. Will give TID. Repeat BMET at 14:00. - Will check Mg this am.  9. Hepatic encephalopathy - Continue lactulose and xifaxan.  - No symptoms of hepatic encephalopathy.  10. Bradycardia - No bradycardia on tele.  - Stable, no change to current plan..    Length of Stay: 6   Christine Ishikawa, NP  03/10/2017, 7:53 AM  Advanced Heart Failure Team  Pager [phone] M-F 7am-4pm.  Please contact [facility] for night-coverage after hours (4p -7a ) and weekends on [email or web address]  Very good diuresis yesterday.  Creatinine stable.  CVP down to 10.  Agree with continuing Lasix and stopping metolazone as above.  Need to replace K and repeat BMET later today.    Christine Knight 03/10/2017

## 2017-03-10 NOTE — Progress Notes (Signed)
K is 3.1, patient will get 2 more doses of today. No changes to orders, will check BMET in the am.

## 2017-03-11 ENCOUNTER — Inpatient Hospital Stay (HOSPITAL_COMMUNITY): Admission: RE | Admit: 2017-03-11 | Payer: Medicare Other | Source: Ambulatory Visit

## 2017-03-11 LAB — GLUCOSE, CAPILLARY
GLUCOSE-CAPILLARY: 152 mg/dL — AB (ref 65–99)
Glucose-Capillary: 68 mg/dL (ref 65–99)
Glucose-Capillary: 221 mg/dL — ABNORMAL HIGH (ref 65–99)
Glucose-Capillary: 144 mg/dL — ABNORMAL HIGH (ref 65–99)
Glucose-Capillary: 170 mg/dL — ABNORMAL HIGH (ref 65–99)

## 2017-03-11 LAB — BASIC METABOLIC PANEL
ANION GAP: 7 (ref 5–15)
BUN: 41 mg/dL — ABNORMAL HIGH (ref 6–20)
CHLORIDE: 84 mmol/L — AB (ref 101–111)
CO2: 40 mmol/L — ABNORMAL HIGH (ref 22–32)
Calcium: 8.7 mg/dL — ABNORMAL LOW (ref 8.9–10.3)
Creatinine, Ser: 1.3 mg/dL — ABNORMAL HIGH (ref 0.44–1.00)
GFR calc Af Amer: 49 mL/min — ABNORMAL LOW (ref >60)
GFR, EST NON AFRICAN AMERICAN: 42 mL/min — AB (ref >60)
GLUCOSE: 118 mg/dL — AB (ref 65–99)
POTASSIUM: 3.1 mmol/L — AB (ref 3.5–5.1)
Sodium: 131 mmol/L — ABNORMAL LOW (ref 135–145)

## 2017-03-11 LAB — COOXEMETRY PANEL
Carboxyhemoglobin: 1.7 % — ABNORMAL HIGH (ref 0.5–1.5)
METHEMOGLOBIN: 1 % (ref 0.0–1.5)
O2 Saturation: 67.3 %
TOTAL HEMOGLOBIN: 12.6 g/dL (ref 12.0–16.0)

## 2017-03-11 LAB — MAGNESIUM: Magnesium: 2.2 mg/dL (ref 1.7–2.4)

## 2017-03-11 MED ORDER — ACETAZOLAMIDE 250 MG PO TABS
250.0000 mg | ORAL_TABLET | Freq: Two times a day (BID) | ORAL | Status: AC
  Administered 2017-03-11 – 2017-03-12 (×4): 250 mg via ORAL
  Filled 2017-03-11 (×4): qty 1

## 2017-03-11 MED ORDER — POTASSIUM CHLORIDE CRYS ER 20 MEQ PO TBCR
40.0000 meq | EXTENDED_RELEASE_TABLET | Freq: Three times a day (TID) | ORAL | Status: AC
  Administered 2017-03-11 (×3): 40 meq via ORAL
  Filled 2017-03-11 (×3): qty 2

## 2017-03-11 MED ORDER — TORSEMIDE 20 MG PO TABS
80.0000 mg | ORAL_TABLET | Freq: Two times a day (BID) | ORAL | Status: DC
  Administered 2017-03-11 – 2017-03-13 (×4): 80 mg via ORAL
  Filled 2017-03-11 (×4): qty 4

## 2017-03-11 MED ORDER — MILRINONE LACTATE IN DEXTROSE 20-5 MG/100ML-% IV SOLN
0.1250 ug/kg/min | INTRAVENOUS | Status: DC
  Filled 2017-03-11: qty 100

## 2017-03-11 NOTE — Progress Notes (Signed)
Advanced Heart Failure Rounding Note  PCP: Arnette Felts Primary Cardiologist: Dr. Gala Romney   Subjective:    Admitted with volume overload on 03/04/17. Started on Lasix gtt at /hr + 2.5 metolazone BID. Started milrinone 0.39mcg on 03/05/17.   Co ox stable at 67. CVP 10 . Weight down 41 pounds total. Creatinine stable.   Feels well today. Denies SOB and chest pain.   Objective:   Weight Range: 201 lb 9.6 oz (91.4 kg) Body mass index is 32.54 kg/m.   Vital Signs:   Temp:  [97.6 F (36.4 C)-98.6 F (37 C)] 97.6 F (36.4 C) (05/01 0801) Pulse Rate:  [65-87] 73 (05/01 0801) Resp:  [16-18] 16 (05/01 0000) BP: (92-114)/(56-67) 105/61 (05/01 0801) SpO2:  [98 %-100 %] 100 % (05/01 0801) Weight:  [201 lb 9.6 oz (91.4 kg)] 201 lb 9.6 oz (91.4 kg) (05/01 0445) Last BM Date: 03/10/17  Weight change: Filed Weights   03/09/17 0500 03/10/17 0300 03/11/17 0445  Weight: 212 lb 14.4 oz (96.6 kg) 206 lb 9.6 oz (93.7 kg) 201 lb 9.6 oz (91.4 kg)    Intake/Output:   Intake/Output Summary (Last 24 hours) at 03/11/17 0851 Last data filed at 03/11/17 0447  Gross per 24 hour  Intake              590 ml  Output             3750 ml  Net            -3160 ml     Physical Exam: General: NAD, sitting on edge of the bed.  HEENT: normal Neck: supple. JVP 8-9 .  Carotids 2+ bilat; no bruits. No thyromegaly or nodule noted. Cor: PMI nondisplaced. Regular rate and rhythm. No murmurs, rubs or gallops. Lungs: Clear to auscultation bilaterally. No wheezing.  Abdomen: soft, non-tender, distended, no HSM.No bruits or masses. + bowel sounds.  Extremities: no cyanosis, clubbing, rash.  Neuro: alert & orientedx3, cranial nerves grossly intact. moves all 4 extremities w/o difficulty.  Affect pleasant.     Telemetry: NSR - rates in the 80's   Labs: Basic Metabolic Panel  Recent Labs  03/10/17 0525 03/10/17 1300 03/11/17 0443  NA 128* 124* 131*  K 2.7* 3.1* 3.1*  CL 78* 77* 84*  CO2 36*  37* 40*  GLUCOSE 220* 334* 118*  BUN 39* 37* 41*  CREATININE 1.28* 1.27* 1.30*  CALCIUM 8.9 8.6* 8.7*  MG 2.1  --  2.2    BNP: BNP (last 3 results)  Recent Labs  12/06/16 1008 01/02/17 1507 03/04/17 1013  BNP 854.6* 827.6* 1,209.6*       Medications:     Scheduled Medications: . aspirin EC  81 mg Oral Daily  . atorvastatin  40 mg Oral q1800  . enoxaparin (LOVENOX) injection  40 mg Subcutaneous Daily  . gabapentin  300 mg Oral TID  . glipiZIDE  5 mg Oral BID WC  . insulin aspart  0-15 Units Subcutaneous TID WC  . insulin aspart  0-5 Units Subcutaneous QHS  . lactulose  30 g Oral BID  . levothyroxine  50 mcg Oral QAC breakfast  . rifaximin  550 mg Oral BID  . sodium chloride flush  10-40 mL Intracatheter Q12H  . sodium chloride flush  3 mL Intravenous Q12H    Infusions: . sodium chloride    . furosemide (LASIX) infusion 30 mg/hr (03/11/17 0748)  . milrinone 0.25 mcg/kg/min (03/10/17 2351)    PRN Medications: sodium  chloride, acetaminophen, ondansetron (ZOFRAN) IV, sodium chloride flush, sodium chloride flush   Assessment/Plan   1. Acute on chronic biventricular HF due to ICM EF 15-20% per echo 04/14/16 - Decrease milrinone to 0.180mcg.  - CVP 14.  - Metolazone stopped yesterday, will stop lasix gtt today at noon and transition to po torsemide  BID.  - last discharge weight was 210 pounds. Weight today 201.  - No ACE or ARB with CKD and BP soft.  - Will start diamox 250 mg BID.  2. CAD s/p NSTEMI and CABG 10/12 - Denies chest pain. - Continue atorvastatin 40 mg daily. Continue ASA 81 mg daily. - No change to current plan.  3. Morbid obesity - Encouraged to lose weight.  - Talked about dietary restrictions today.  4. Pulmonary HTN - Mild to mod RHC 12/13/15 with PVR 3.24. - Continue to diurese.  - no change to current plan.  5. CKD Stage III-IV  - Creatinine stable.  - no change to current plan.  6. DM2 - Continue SSI.   - CBG's improving. -  Continue current medical management.  7. OSA - CPAP hs - No change to current plan.   8. Hypokalemia - K 3.1, will give  - Will check Mg this am.  9. Hepatic encephalopathy - Continue lactulose and xifaxan.  - No symptoms of hepatic encephalopathy.  10. Bradycardia - No bradycardia on tele.  - Stable, no change to current plan..    Length of Stay: 7   Little Ishikawa, NP  03/11/2017, 8:51 AM  Advanced Heart Failure Team  Pager [phone] M-F 7am-4pm.  Please contact [facility] for night-coverage after hours (4p -7a ) and weekends on [email or web address]  Patient seen and examined with Suzzette Righter, NP. We discussed all aspects of the encounter. I agree with the assessment and plan as stated above.   Her volume status is much improved. Now back to baseline. Co-ox looks good. Will begin milrinone wean and switch IV lasix back to po torsemide. Supp K+.   Renal function is stable.   Hepatic encephalopathy well controlled.

## 2017-03-11 NOTE — Progress Notes (Signed)
Hypoglycemic Event  CBG: 68  Treatment: 15 GM carbohydrate snack  Symptoms: None  Follow-up CBG: Time:0830 CBG Result:152  Possible Reasons for Event: Unknown  Comments/MD notified:Erin Katrinka Blazing, NP    Lucrecia Mcphearson Gibson Ramp

## 2017-03-12 LAB — GLUCOSE, CAPILLARY
Glucose-Capillary: 132 mg/dL — ABNORMAL HIGH (ref 65–99)
Glucose-Capillary: 198 mg/dL — ABNORMAL HIGH (ref 65–99)
Glucose-Capillary: 169 mg/dL — ABNORMAL HIGH (ref 65–99)
Glucose-Capillary: 135 mg/dL — ABNORMAL HIGH (ref 65–99)

## 2017-03-12 LAB — COOXEMETRY PANEL
Carboxyhemoglobin: 2.1 % — ABNORMAL HIGH (ref 0.5–1.5)
Methemoglobin: 0.8 % (ref 0.0–1.5)
O2 Saturation: 70.8 %
Total hemoglobin: 12.6 g/dL (ref 12.0–16.0)

## 2017-03-12 LAB — BASIC METABOLIC PANEL
ANION GAP: 9 (ref 5–15)
BUN: 42 mg/dL — ABNORMAL HIGH (ref 6–20)
CHLORIDE: 84 mmol/L — AB (ref 101–111)
CO2: 37 mmol/L — AB (ref 22–32)
Calcium: 8.7 mg/dL — ABNORMAL LOW (ref 8.9–10.3)
Creatinine, Ser: 1.42 mg/dL — ABNORMAL HIGH (ref 0.44–1.00)
GFR calc Af Amer: 44 mL/min — ABNORMAL LOW (ref >60)
GFR calc non Af Amer: 38 mL/min — ABNORMAL LOW (ref >60)
GLUCOSE: 175 mg/dL — AB (ref 65–99)
POTASSIUM: 3.1 mmol/L — AB (ref 3.5–5.1)
Sodium: 130 mmol/L — ABNORMAL LOW (ref 135–145)

## 2017-03-12 MED ORDER — POTASSIUM CHLORIDE CRYS ER 20 MEQ PO TBCR
40.0000 meq | EXTENDED_RELEASE_TABLET | Freq: Three times a day (TID) | ORAL | Status: AC
  Administered 2017-03-12 (×3): 40 meq via ORAL
  Filled 2017-03-12 (×3): qty 2

## 2017-03-12 MED ORDER — SPIRONOLACTONE 25 MG PO TABS
12.5000 mg | ORAL_TABLET | Freq: Every day | ORAL | Status: DC
  Administered 2017-03-12 – 2017-03-13 (×2): 12.5 mg via ORAL
  Filled 2017-03-12 (×2): qty 1

## 2017-03-12 NOTE — Progress Notes (Signed)
Advanced Heart Failure Rounding Note  PCP: Arnette Felts Primary Cardiologist: Dr. Gala Romney   Subjective:    Admitted with volume overload on 03/04/17. Started on Lasix gtt at /hr + 2.5 metolazone BID. Started milrinone 0.17mcg on 03/05/17. Weaned down to 0.125 mcg on 03/11/17.   Co ox stable at 71. CVP 14. Creatinine stable, bumped a little today from 1.30->1.42. Weight down 42 pounds total.   Feels well today. No SOB or chest pain. Has gotten up this morning, no SOB with ambulation.   Objective:   Weight Range: 200 lb 9.6 oz (91 kg) Body mass index is 32.38 kg/m.   Vital Signs:   Temp:  [97.5 F (36.4 C)-98.6 F (37 C)] 98.6 F (37 C) (05/02 0430) Pulse Rate:  [63-118] 63 (05/02 0430) Resp:  [16-18] 18 (05/02 0430) BP: (92-112)/(53-85) 92/53 (05/02 0430) SpO2:  [96 %-100 %] 96 % (05/02 0430) Weight:  [200 lb 9.6 oz (91 kg)] 200 lb 9.6 oz (91 kg) (05/02 0500) Last BM Date: 03/11/17  Weight change: Filed Weights   03/10/17 0300 03/11/17 0445 03/12/17 0500  Weight: 206 lb 9.6 oz (93.7 kg) 201 lb 9.6 oz (91.4 kg) 200 lb 9.6 oz (91 kg)    Intake/Output:   Intake/Output Summary (Last 24 hours) at 03/12/17 0758 Last data filed at 03/12/17 0622  Gross per 24 hour  Intake           1799.2 ml  Output             3050 ml  Net          -1250.8 ml     Physical Exam: General: NAD. Sitting on edge of the bed, eating breakfast.  HEENT: Normal. Neck: supple. JVP 6-7 cm.  Carotids 2+ bilat; no bruits. No thyromegaly or nodule noted. Cor: PMI nondisplaced. Regular rate and rhythm. No murmurs, rubs or gallops. Lungs: Clear to auscultation bilaterally. No wheezing.  Abdomen: soft, non-tender, distended, no HSM.No bruits or masses. + bowel sounds.  Extremities: no cyanosis, clubbing, rash. Warm, 2+ pedal edema.  Neuro: alert & orientedx3, cranial nerves grossly intact. moves all 4 extremities w/o difficulty.  Affect pleasant.     Telemetry: NSR - rates in the 80's.    Labs: Basic Metabolic Panel  Recent Labs  03/10/17 0525  03/11/17 0443 03/12/17 0417  NA 128*  < > 131* 130*  K 2.7*  < > 3.1* 3.1*  CL 78*  < > 84* 84*  CO2 36*  < > 40* 37*  GLUCOSE 220*  < > 118* 175*  BUN 39*  < > 41* 42*  CREATININE 1.28*  < > 1.30* 1.42*  CALCIUM 8.9  < > 8.7* 8.7*  MG 2.1  --  2.2  --   < > = values in this interval not displayed.  BNP: BNP (last 3 results)  Recent Labs  12/06/16 1008 01/02/17 1507 03/04/17 1013  BNP 854.6* 827.6* 1,209.6*       Medications:     Scheduled Medications: . acetaZOLAMIDE  250 mg Oral BID  . aspirin EC  81 mg Oral Daily  . atorvastatin  40 mg Oral q1800  . enoxaparin (LOVENOX) injection  40 mg Subcutaneous Daily  . gabapentin  300 mg Oral TID  . glipiZIDE  5 mg Oral BID WC  . insulin aspart  0-15 Units Subcutaneous TID WC  . insulin aspart  0-5 Units Subcutaneous QHS  . lactulose  30 g Oral BID  .  levothyroxine  50 mcg Oral QAC breakfast  . rifaximin  550 mg Oral BID  . sodium chloride flush  10-40 mL Intracatheter Q12H  . sodium chloride flush  3 mL Intravenous Q12H  . torsemide  80 mg Oral BID    Infusions: . sodium chloride    . milrinone 0.125 mcg/kg/min (03/12/17 0401)    PRN Medications: sodium chloride, acetaminophen, ondansetron (ZOFRAN) IV, sodium chloride flush, sodium chloride flush   Assessment/Plan   1. Acute on chronic biventricular HF due to ICM EF 15-20% per echo 04/14/16 - Stop milrinone today.   - CVP 14 today.  - Continue torsemide  BID.  - Restart Spiro 12.5mg  daily.  - No ACE or ARB, BP too soft.  - Continue diamox, stop tomorrow.  2. CAD s/p NSTEMI and CABG 10/12 - Denies chest pain. - Continue atorvastatin 40 mg daily.  - Continue daily ASA 3. Morbid obesity - Encouraged to lose weight.  - Talked about dietary restrictions today.  - Encouraged her to eat a low salt diet when she goes home to prevent volume overload and repeat hospitalizations.  4. Pulmonary  HTN - Mild to mod RHC 12/13/15 with PVR 3.24. - Continue to diurese.  - Continue current medical management.  5. CKD Stage III-IV  - Creatinine stable.  - No change to current plan.  6. DM2 - Continue SSI.   - CBG's improving. - Continue current medical management.  7. OSA - CPAP hs - No change to current plan    8. Hypokalemia - K 3.1.  Increased Kcl TID.  - Mg ok .  9. Hepatic encephalopathy - Continue lactulose and xifaxan.  - No symptoms of hepatic encephalopathy.  - No change to current plan.  10. Bradycardia - No bradycardia on tele.  - Stable.   Length of Stay: 8   Little Ishikawa, NP  03/12/2017, 7:58 AM  Advanced Heart Failure Team  Pager [phone] M-F 7am-4pm.  Please contact [facility] for night-coverage after hours (4p -7a ) and weekends on [email or web address]  Patient seen and examined with Suzzette Righter, NP. We discussed all aspects of the encounter. I agree with the assessment and plan as stated above.   She is much improved. Weight down 40 pounds with diuresis. Co-ox ok on low-dose milrinone. Will stop milrinone. CVP still up a bit. Continue po diuretics. Renal function stable. K low. Will supp. Hopefully home in am.  Arvilla Meres, MD  8:15 PM

## 2017-03-13 LAB — COOXEMETRY PANEL
Carboxyhemoglobin: 1.2 % (ref 0.5–1.5)
Methemoglobin: 0.9 % (ref 0.0–1.5)
O2 Saturation: 48.4 %
Total hemoglobin: 13.5 g/dL (ref 12.0–16.0)

## 2017-03-13 LAB — GLUCOSE, CAPILLARY
GLUCOSE-CAPILLARY: 101 mg/dL — AB (ref 65–99)
Glucose-Capillary: 67 mg/dL (ref 65–99)
Glucose-Capillary: 200 mg/dL — ABNORMAL HIGH (ref 65–99)

## 2017-03-13 LAB — BASIC METABOLIC PANEL
ANION GAP: 9 (ref 5–15)
BUN: 47 mg/dL — ABNORMAL HIGH (ref 6–20)
CO2: 34 mmol/L — AB (ref 22–32)
Calcium: 8.9 mg/dL (ref 8.9–10.3)
Chloride: 88 mmol/L — ABNORMAL LOW (ref 101–111)
Creatinine, Ser: 1.55 mg/dL — ABNORMAL HIGH (ref 0.44–1.00)
GFR calc non Af Amer: 34 mL/min — ABNORMAL LOW (ref >60)
GFR, EST AFRICAN AMERICAN: 39 mL/min — AB (ref >60)
GLUCOSE: 90 mg/dL (ref 65–99)
POTASSIUM: 3.3 mmol/L — AB (ref 3.5–5.1)
Sodium: 131 mmol/L — ABNORMAL LOW (ref 135–145)

## 2017-03-13 MED ORDER — GLIPIZIDE 5 MG PO TABS: 5.0000 mg | ORAL_TABLET | Freq: Two times a day (BID) | ORAL | 6 refills | Status: DC

## 2017-03-13 MED ORDER — POTASSIUM CHLORIDE CRYS ER 20 MEQ PO TBCR
40.0000 meq | EXTENDED_RELEASE_TABLET | Freq: Two times a day (BID) | ORAL | Status: AC
  Administered 2017-03-13 (×2): 40 meq via ORAL
  Filled 2017-03-13 (×2): qty 2

## 2017-03-13 MED ORDER — LEVOTHYROXINE SODIUM 50 MCG PO TABS: 50.0000 ug | ORAL_TABLET | Freq: Every day | ORAL | 6 refills | Status: AC

## 2017-03-13 MED FILL — LEVOTHYROXINE 50 MCG TABLET: 50 | 30 days supply | Qty: 30 | Fill #0

## 2017-03-13 MED FILL — glipiZIDE 5 MG TABS: 5 | 30 days supply | Qty: 60 | Fill #0

## 2017-03-13 NOTE — Discharge Instructions (Signed)
Heart Failure °Heart failure means your heart has trouble pumping blood. This makes it hard for your body to work well. Heart failure is usually a long-term (chronic) condition. You must take good care of yourself and follow your doctor's treatment plan. °Follow these instructions at home: °· Take your heart medicine as told by your doctor. °? Do not stop taking medicine unless your doctor tells you to. °? Do not skip any dose of medicine. °? Refill your medicines before they run out. °? Take other medicines only as told by your doctor or pharmacist. °· Stay active if told by your doctor. The elderly and people with severe heart failure should talk with a doctor about physical activity. °· Eat heart-healthy foods. Choose foods that are without trans fat and are low in saturated fat, cholesterol, and salt (sodium). This includes fresh or frozen fruits and vegetables, fish, lean meats, fat-free or low-fat dairy foods, whole grains, and high-fiber foods. Lentils and dried peas and beans (legumes) are also good choices. °· Limit salt if told by your doctor. °· Cook in a healthy way. Roast, grill, broil, bake, poach, steam, or stir-fry foods. °· Limit fluids as told by your doctor. °· Weigh yourself every morning. Do this after you pee (urinate) and before you eat breakfast. Write down your weight to give to your doctor. °· Take your blood pressure and write it down if your doctor tells you to. °· Ask your doctor how to check your pulse. Check your pulse as told. °· Lose weight if told by your doctor. °· Stop smoking or chewing tobacco. Do not use gum or patches that help you quit without your doctor's approval. °· Schedule and go to doctor visits as told. °· Nonpregnant women should have no more than 1 drink a day. Men should have no more than 2 drinks a day. Talk to your doctor about drinking alcohol. °· Stop illegal drug use. °· Stay current with shots (immunizations). °· Manage your health conditions as told by your  doctor. °· Learn to manage your stress. °· Rest when you are tired. °· If it is really hot outside: °? Avoid intense activities. °? Use air conditioning or fans, or get in a cooler place. °? Avoid caffeine and alcohol. °? Wear loose-fitting, lightweight, and light-colored clothing. °· If it is really cold outside: °? Avoid intense activities. °? Layer your clothing. °? Wear mittens or gloves, a hat, and a scarf when going outside. °? Avoid alcohol. °· Learn about heart failure and get support as needed. °· Get help to maintain or improve your quality of life and your ability to care for yourself as needed. °Contact a doctor if: °· You gain weight quickly. °· You are more short of breath than usual. °· You cannot do your normal activities. °· You tire easily. °· You cough more than normal, especially with activity. °· You have any or more puffiness (swelling) in areas such as your hands, feet, ankles, or belly (abdomen). °· You cannot sleep because it is hard to breathe. °· You feel like your heart is beating fast (palpitations). °· You get dizzy or light-headed when you stand up. °Get help right away if: °· You have trouble breathing. °· There is a change in mental status, such as becoming less alert or not being able to focus. °· You have chest pain or discomfort. °· You faint. °This information is not intended to replace advice given to you by your health care provider. Make sure you   discuss any questions you have with your health care provider. °Document Released: 08/06/2008 Document Revised: 04/04/2016 Document Reviewed: 12/14/2012 °Elsevier Interactive Patient Education © 2017 [employer] ° °

## 2017-03-13 NOTE — Care Management Important Message (Signed)
Important Message  Patient Details  Name: Christine BabeBarbara Reagle MRN: [id number] Date of Birth: 02/01/1951   Medicare Important Message Given:  Yes    Zelma Mazariego Abena 03/13/2017, 11:13 AM

## 2017-03-13 NOTE — Progress Notes (Signed)
Advanced Heart Failure Rounding Note  PCP: Arnette Felts Primary Cardiologist: Dr. Gala Romney   Subjective:    Off milrinone. Feeling good. No SOB or orthopnea. Weight stable at 200  Objective:   Weight Range: 90.8 kg (200 lb 3.2 oz) Body mass index is 32.31 kg/m.   Vital Signs:   Temp:  [97.7 F (36.5 C)-99 F (37.2 C)] 97.7 F (36.5 C) (05/03 0631) Pulse Rate:  [55-71] 55 (05/02 2300) Resp:  [15-18] 15 (05/03 0631) BP: (91-117)/(55-87) 100/59 (05/03 0631) SpO2:  [94 %-100 %] 99 % (05/03 0631) Weight:  [90.8 kg (200 lb 3.2 oz)] 90.8 kg (200 lb 3.2 oz) (05/03 0500) Last BM Date: 03/12/17  Weight change: Filed Weights   03/11/17 0445 03/12/17 0500 03/13/17 0500  Weight: 91.4 kg (201 lb 9.6 oz) 91 kg (200 lb 9.6 oz) 90.8 kg (200 lb 3.2 oz)    Intake/Output:   Intake/Output Summary (Last 24 hours) at 03/13/17 0701 Last data filed at 03/12/17 2030  Gross per 24 hour  Intake              730 ml  Output              800 ml  Net              -70 ml     Physical Exam: General:  Lying in bed. No resp difficulty HEENT: normal Neck: supple. JVP 8 Carotids 2+ bilat; no bruits. No lymphadenopathy or thryomegaly appreciated. Cor: PMI nondisplaced. Regular rate & rhythm. No rubs, gallops or murmurs. Lungs: clear Abdomen: obese soft, nontender, nondistended. No hepatosplenomegaly. No bruits or masses. Good bowel sounds. Extremities: no cyanosis, clubbing, rash, edema Neuro: alert & orientedx3, cranial nerves grossly intact. moves all 4 extremities w/o difficulty. Affect pleasant     Telemetry: NSR in 50-60s with PVCs. Personally reviewed   Labs: Basic Metabolic Panel  Recent Labs  03/11/17 0443 03/12/17 0417 03/13/17 0514  NA 131* 130* 131*  K 3.1* 3.1* 3.3*  CL 84* 84* 88*  CO2 40* 37* 34*  GLUCOSE 118* 175* 90  BUN 41* 42* 47*  CREATININE 1.30* 1.42* 1.55*  CALCIUM 8.7* 8.7* 8.9  MG 2.2  --   --     BNP: BNP (last 3 results)  Recent Labs   12/06/16 1008 01/02/17 1507 03/04/17 1013  BNP 854.6* 827.6* 1,209.6*       Medications:     Scheduled Medications: . aspirin EC  81 mg Oral Daily  . atorvastatin  40 mg Oral q1800  . enoxaparin (LOVENOX) injection  40 mg Subcutaneous Daily  . gabapentin  300 mg Oral TID  . glipiZIDE  5 mg Oral BID WC  . insulin aspart  0-15 Units Subcutaneous TID WC  . insulin aspart  0-5 Units Subcutaneous QHS  . lactulose  30 g Oral BID  . levothyroxine  50 mcg Oral QAC breakfast  . rifaximin  550 mg Oral BID  . sodium chloride flush  10-40 mL Intracatheter Q12H  . sodium chloride flush  3 mL Intravenous Q12H  . spironolactone  12.5 mg Oral Daily  . torsemide  80 mg Oral BID    Infusions: . sodium chloride      PRN Medications: sodium chloride, acetaminophen, ondansetron (ZOFRAN) IV, sodium chloride flush, sodium chloride flush   Assessment/Plan   1. 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 CAD s/p NSTEMI and CABG 10/12 3. Morbid obesity.  4. Pulmonary HTN 5. CKD Stage III-IV  6. DM2 7. OSA 8. Hypokalemia.  9. Hepatic encephalopathy 10. Bradycardia  Doing well. Stable off milrinone. CVP and weight stable. Can go home today. Supp K+ prior to d/c  Hepatic encephalopathy stable.   Occasioanl brady but asx.   Reinforced need for daily weights and reviewed use of sliding scale diuretics.  F/u HF Clinic 1-2 weeks  Length of Stay: 9   Arvilla MeresBensimhon, Daniel, MD  03/13/2017, 7:01 AM  Advanced Heart Failure Team  Pager [phone] M-F 7am-4pm.  Please contact [facility] for night-coverage after hours (4p -7a ) and weekends on [email or web address]

## 2017-03-13 NOTE — Discharge Summary (Signed)
Advanced [facility] Failure Team  Discharge Summary   Patient ID: Christine Knight MRN: [id number], DOB/AGE: 1950-11-23 66 y.o. Admit date: 03/04/2017 D/C date:     03/13/2017   Primary Discharge Diagnoses:  1. 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 CAD s/p NSTEMI and CABG 10/12 3. Morbid obesity.  4. Pulmonary HTN 5. CKD Stage III-IV  6. DM2 7. OSA 8. Hypokalemia.  9. Hepatic encephalopathy 10. Bradycardia 11. Hypothyroidism- TSH 4/24--> 21.1  Hospital Course:  Christine Knight is a 66 y.o. female with significant for morbid obesity, Biventricular HF due to ICM (04/30/16 LVEF 15-20%, Grade 2, RV severely dilated and reduced, Mod LAE, Severe RAE, Moderate TI, PA peak pressure 73 mm Hg), CAD with h/o NSTEM s/p CABG Oct 2012, pulmonary HTN, and poorly controlled DM2.  Admitted with marked volume overload despite close follow up in the community and  frequent diuretic adjustments. She was diuresed with lasix drip 30 mg per hour+ metolazone + milrinone. PICC line was placed to follow CO-OX and CVP. Overall diuresed 42 pounds.  Renal function was followed closely.  Placed back on torsemide 80 mg twice a day + spiro 25 mg daily. She is not on bb due to RV failure and low output. She is not on arb with ckd.   Ammonia remained elevated despite lactulose and rifaximin. She remained neurologically stable. Plan to continue current regimen. Adjustments made to home diabetes regimen. She will continue glipizide 5 mg twice a day. She was also started on levothyroxine for hypothyroidism. TSH on admit was 21. Plan to set up follow up with PCP next week.   Will continue to follow closely in the HF clinic + [employer]. Will need to follow weekly for 4 weeks.    Discharge Weight: 200 pounds.  Discharge Vitals: Blood pressure (!) 100/59, pulse (!) 55, temperature 97.7 F (36.5 C), temperature source Oral, resp. rate 15, height 5\' 6"  (1.676 m), weight 200 lb 3.2 oz  (90.8 kg), SpO2 99 %.  Labs: Lab Results  Component Value Date   WBC 6.2 03/04/2017   HGB 14.5 03/04/2017   HCT 44.0 03/04/2017   MCV 88.4 03/04/2017   PLT 258 03/04/2017     Recent Labs Lab 03/13/17 0514  NA 131*  K 3.3*  CL 88*  CO2 34*  BUN 47*  CREATININE 1.55*  CALCIUM 8.9  GLUCOSE 90   Lab Results  Component Value Date   CHOL 136 12/09/2015   HDL 26 (L) 12/09/2015   LDLCALC 94 12/09/2015   TRIG 82 12/09/2015   BNP (last 3 results)  Recent Labs  12/06/16 1008 01/02/17 1507 03/04/17 1013  BNP 854.6* 827.6* 1,209.6*    ProBNP (last 3 results) No results for input(s): PROBNP in the last 8760 hours.   Diagnostic Studies/Procedures   No results found.  Discharge Medications   Allergies as of 03/13/2017      Reactions   Lisinopril Nausea And Vomiting, Other (See Comments), Cough   Coughing, throwing up, waking up coughing   Metformin And Related Anaphylaxis      Medication List    TAKE these medications   acetaminophen 650 MG CR tablet Commonly known as:  TYLENOL Take 650 mg by mouth every 8 (eight) hours as needed (for arthritic pain).   aspirin EC 81 MG tablet Take 1 tablet (81 mg total) by mouth daily.   atorvastatin 40 MG tablet Commonly known as:  LIPITOR Take 1 tablet (40 mg  total) by mouth daily.   CENTRUM SILVER 50+WOMEN PO Take 1 tablet by mouth every morning.   gabapentin 300 MG capsule Commonly known as:  NEURONTIN Take 300 mg by mouth 3 (three) times daily.   glipiZIDE 5 MG tablet Commonly known as:  GLUCOTROL Take 1 tablet (5 mg total) by mouth 2 (two) times daily with a meal. What changed:  medication strength  how much to take  when to take this  reasons to take this   lactulose 10 GM/15ML solution Commonly known as:  CHRONULAC Take 45 mLs (30 g total) by mouth 2 (two) times daily.   levothyroxine 50 MCG tablet Commonly known as:  SYNTHROID, LEVOTHROID Take 1 tablet (50 mcg total) by mouth daily before  breakfast.   potassium chloride SA 20 MEQ tablet Commonly known as:  K-DUR,KLOR-CON Take 1 tablet (20 mEq total) by mouth daily.   rifaximin 550 MG Tabs tablet Commonly known as:  XIFAXAN Take 1 tablet (550 mg total) by mouth 2 (two) times daily.   spironolactone 25 MG tablet Commonly known as:  ALDACTONE Take 1 tablet (25 mg total) by mouth daily.   torsemide 20 MG tablet Commonly known as:  DEMADEX Take 4 tablets (80 mg total) by mouth 2 (two) times daily.       Disposition   The patient will be discharged in stable condition to home. Discharge Instructions    (HEART FAILURE PATIENTS) Call MD:  Anytime you have any of the following symptoms: 1) 3 pound weight gain in 24 hours or 5 pounds in 1 week 2) shortness of breath, with or without a dry hacking cough 3) swelling in the hands, feet or stomach 4) if you have to sleep on extra pillows at night in order to breathe.    Complete by:  As directed    Diet - low sodium heart healthy    Complete by:  As directed    Heart Failure patients record your daily weight using the same scale at the same time of day    Complete by:  As directed    Increase activity slowly    Complete by:  As directed      Follow-up Information    Tonye BecketAmy Clegg, NP Follow up on 03/20/2017.   Specialty:  Cardiology Why:  at 2:30 Garage Code [address]000 Contact information: [address] [place] [address] [phone]             Duration of Discharge Encounter: Greater than 35 minutes   Signed, [name] NP-C 03/13/2017, 8:19 AM  Patient seen and examined with Tonye BecketAmy Clegg, NP. We discussed all aspects of the encounter. I agree with the assessment and plan as stated above.   She is much improved with high-volume diuresis with milrinone support. Renal function stable. Stressed need to watch fluid intake and need for sliding scale diuretics.   Arvilla MeresBensimhon, Evora Schechter, MD  8:48 PM

## 2017-03-13 NOTE — Plan of Care (Signed)
Problem: Education: Goal: Ability to demonstrate managment of disease process will improve Outcome: Adequate for Discharge Pt refused the heart failure pt stating she has several of them already at home.Pt was able to verbally demonstrate to RN via teach back things from in the packet.   Problem: Education: Goal: Knowledge of [facility] Education information/materials will improve Outcome: Completed/Met Date Met: 03/13/17 Pt educated on the importance of hand hygiene, taking meds as prescribed, pain mgmt, weight & fluid mgmt, & f/u appointments,

## 2017-03-13 NOTE — Progress Notes (Signed)
Hypoglycemic Event  CBG: 67  Treatment: 15 GM carbohydrate snack  Symptoms: None  Follow-up CBG: Time:0908 CBG Result:101  Possible Reasons for Event: Inadequate meal intake  Comments/MD notified: Tonye BecketAmy Clegg, PA    Sanda LingerMilam, Taje Tondreau R

## 2017-03-13 NOTE — Progress Notes (Signed)
Pt given discharge instructions & education on meds, f/t appointments, daily wts, & diet. Pt's PICC was removed by IV team. CCMD was notified of pt being d/c'd & her monitor was taken off. Pt's BP was running soft as well as blood glucose earlier both were rechecked & pt felt like her normal self. Glucose came up to 101 & BP was 98/64. All of the pt's belongings were gathered. Sanda LingerMilam, Kirstine Jacquin R, RN

## 2017-03-14 ENCOUNTER — Telehealth (HOSPITAL_COMMUNITY): Payer: Self-pay | Admitting: *Deleted

## 2017-03-14 ENCOUNTER — Other Ambulatory Visit (HOSPITAL_COMMUNITY): Payer: Self-pay

## 2017-03-14 NOTE — Progress Notes (Signed)
Paramedicine Encounter    Patient ID: Christine Knight, female    DOB: 01-23-1951, 66 y.o.   MRN: [id number]    Patient Care Team: Coralee Rud, PA-C as PCP - General (Cardiology) Luis Abed, MD (Cardiology)  Patient Active Problem List   Diagnosis Date Noted  . Acute on chronic systolic CHF (congestive heart failure) (HCC) 03/04/2017  . OSA (obstructive sleep apnea) 02/25/2017  . RVF (right ventricular failure) (HCC)   . Hepatic encephalopathy (HCC) 01/03/2017  . Systolic CHF, acute on chronic (HCC) 01/02/2017  . Acute on chronic systolic ACC/AHA stage C congestive heart failure (HCC) 12/06/2016  . CAD (coronary artery disease) 12/06/2016  . Suspected sleep apnea 11/21/2016  . Acute on chronic diastolic CHF (congestive heart failure) (HCC) 08/26/2016  . CHF (congestive heart failure) (HCC) 08/26/2016  . CKD (chronic kidney disease) stage 3, GFR 30-59 ml/min 05/02/2016  . Chest tightness   . Mass of lower lobe of left lung 07/25/2015  . Primary gout 07/25/2015  . NSTEMI- declined cath 05/29/2015  . DJD (degenerative joint disease), multiple sites 11/07/2014  . Painful diabetic neuropathy (HCC) 10/10/2014  . Hyperkalemia 06/17/2013  . Chronic systolic heart failure (HCC) 04/02/2013  . Hypothyroidism 03/18/2013  . Pulmonary hypertension (HCC)   . Hx of CABG 2012 08/30/2011  . Obesity, morbid-BMI 45   . Type 2 diabetes, uncontrolled, with neuropathy (HCC)   . Ischemic cardiomyopathy-30-35% May 2016 08/13/2011  . Hyperlipidemia LDL goal <70 08/10/2011    Current Outpatient Prescriptions:  .  aspirin 81 MG tablet, Take 1 tablet (81 mg total) by mouth daily., Disp: 30 tablet, Rfl: 9 .  atorvastatin (LIPITOR) 40 MG tablet, Take 1 tablet (40 mg total) by mouth daily., Disp: 90 tablet, Rfl: 3 .  glipiZIDE (GLUCOTROL) 5 MG tablet, Take 1 tablet (5 mg total) by mouth 2 (two) times daily with a meal., Disp: 60 tablet, Rfl: 6 .  lactulose (CHRONULAC) 10 GM/15ML solution, Take 45  mLs (30 g total) by mouth 2 (two) times daily., Disp: 2700 mL, Rfl: 6 .  levothyroxine (SYNTHROID, LEVOTHROID) 50 MCG tablet, Take 1 tablet (50 mcg total) by mouth daily before breakfast., Disp: 30 tablet, Rfl: 6 .  Multiple Vitamins-Minerals (CENTRUM SILVER 50+WOMEN PO), Take 1 tablet by mouth every morning., Disp: , Rfl:  .  potassium chloride SA (K-DUR,KLOR-CON) 20 MEQ tablet, Take 1 tablet (20 mEq total) by mouth daily., Disp: 30 tablet, Rfl: 6 .  rifaximin (XIFAXAN) 550 MG TABS tablet, Take 1 tablet (550 mg total) by mouth 2 (two) times daily., Disp: 60 tablet, Rfl: 12 .  torsemide (DEMADEX) 20 MG tablet, Take 4 tablets (80 mg total) by mouth 2 (two) times daily., Disp: 240 tablet, Rfl: 12 .  acetaminophen (TYLENOL) 650 MG CR tablet, Take 650 mg by mouth every 8 (eight) hours as needed (for arthritic pain). , Disp: , Rfl:  .  gabapentin (NEURONTIN) 300 MG capsule, Take 300 mg by mouth 3 (three) times daily. , Disp: , Rfl:  .  spironolactone (ALDACTONE) 25 MG tablet, Take 1 tablet (25 mg total) by mouth daily., Disp: 30 tablet, Rfl: 5 Allergies  Allergen Reactions  . Lisinopril Nausea And Vomiting, Other (See Comments) and Cough    Coughing, throwing up, waking up coughing  . Metformin And Related Anaphylaxis     Social History   Social History  . Marital status: Divorced    Spouse name: N/A  . Number of children: 3  . Years of education: N/A  Occupational History  . Bank teller    Social History Main Topics  . Smoking status: Former Smoker    Types: Cigarettes    Quit date: 11/11/1978  . Smokeless tobacco: Never Used     Comment: 03/17/2013 "only a social smoker when I did smoke; ever bought any"  . Alcohol use Yes     Comment: OCCASIONAL  . Drug use: No  . Sexual activity: Not Currently    Birth control/ protection: Post-menopausal   Other Topics Concern  . Not on file   Social History Narrative   Lives with her sister and daughter.  Ambulates independently.     Physical Exam  Pulmonary/Chest: No respiratory distress. She has no wheezes. She has no rales.  Abdominal: She exhibits no distension. There is no tenderness. There is no rebound and no guarding.  Musculoskeletal: She exhibits no edema.  Skin: Skin is warm and dry. She is not diaphoretic.        Future Appointments Date Time Provider Department Center  03/20/2017 2:30 PM [facility] PA/NP [facility] None    [facility] pt CAO x4 walking around in the kitchen and talking to her daughter. Pt stated that she had taken the levothyroxine about 20 mins ago because her physician told her to take it 30 mins before she eats.  Pt was discharged from the hospital yesterday. Pt stated that she feels a lot better since "they got that fluid off of her".  Pt has no complaints; she denies sob, headache, dizziness and chest pain.  Pt's daughter and family has arranged for someone to stay with pt for the next couple of days.  rx bottles verified and pill box filled.  Pt was given the rest of her am medications.  About 3 mins later pt stated that she needed to sit down.    I obtained a set of vitals and her initial bp was 88/70. Pt stated that the RN had to check her bp several times this morning during her visit.  Pt's bp was taken while standing and no ortho changes noted.  After several mins of pt sitting down, I re-checked her bp @ 98/70. Pt still denies dizziness and blurred vision.  I advised pt to continue sitting for the next 30 mins and sit up slowly when she stands.  I also advised her and her daughter to call me back if her symptoms did not change. Susie at the heart clinic was made aware and she was satisfied with pt's last set of vitals.  **pt stated that her physician has put gabapentin on hold to see how she does with the lyrica.  glizide has been decreased to 5mg ; placed in night time slot due her wanting to take prn. Pt is taking lyrica 1 pill twice daily (morning/eveing)  BP 98/70 (BP Location: Right Arm,  Patient Position: Sitting, Cuff Size: Normal)   Pulse 62   Resp 16   Wt 202 lb 3.2 oz (91.7 kg)   SpO2 99%   BMI 32.64 kg/m  cbg 129     Christine Knight, EMT Paramedic 03/14/2017    ACTION: Home visit completed Next visit planned for next friday

## 2017-03-14 NOTE — Telephone Encounter (Signed)
[name] with paramed called to report that when visiting patient her BP was 88/70 and it was taken about 30 mins after her morning meds.  Orthostatic vitals were negative and patient was not dizzy or lightheaded.  HR was normal.  [name] rechecked BP prior to leaving and it was 98/70.    [name] with continue to monitor patient and report any abnormal findings.

## 2017-03-17 ENCOUNTER — Emergency Department (HOSPITAL_COMMUNITY): Payer: Medicare Other

## 2017-03-17 ENCOUNTER — Encounter (HOSPITAL_COMMUNITY): Payer: Self-pay | Admitting: Vascular Surgery

## 2017-03-17 DIAGNOSIS — E039 Hypothyroidism, unspecified: Secondary | ICD-10-CM | POA: Diagnosis present

## 2017-03-17 DIAGNOSIS — I340 Nonrheumatic mitral (valve) insufficiency: Secondary | ICD-10-CM | POA: Diagnosis present

## 2017-03-17 DIAGNOSIS — Z7982 Long term (current) use of aspirin: Secondary | ICD-10-CM

## 2017-03-17 DIAGNOSIS — E1122 Type 2 diabetes mellitus with diabetic chronic kidney disease: Secondary | ICD-10-CM | POA: Diagnosis present

## 2017-03-17 DIAGNOSIS — I252 Old myocardial infarction: Secondary | ICD-10-CM

## 2017-03-17 DIAGNOSIS — Z79899 Other long term (current) drug therapy: Secondary | ICD-10-CM

## 2017-03-17 DIAGNOSIS — Z87891 Personal history of nicotine dependence: Secondary | ICD-10-CM

## 2017-03-17 DIAGNOSIS — I739 Peripheral vascular disease, unspecified: Secondary | ICD-10-CM | POA: Diagnosis present

## 2017-03-17 DIAGNOSIS — E785 Hyperlipidemia, unspecified: Secondary | ICD-10-CM | POA: Diagnosis present

## 2017-03-17 DIAGNOSIS — I272 Pulmonary hypertension, unspecified: Secondary | ICD-10-CM | POA: Diagnosis present

## 2017-03-17 DIAGNOSIS — I130 Hypertensive heart and chronic kidney disease with heart failure and stage 1 through stage 4 chronic kidney disease, or unspecified chronic kidney disease: Secondary | ICD-10-CM | POA: Diagnosis present

## 2017-03-17 DIAGNOSIS — E871 Hypo-osmolality and hyponatremia: Secondary | ICD-10-CM | POA: Diagnosis present

## 2017-03-17 DIAGNOSIS — L03113 Cellulitis of right upper limb: Secondary | ICD-10-CM | POA: Diagnosis not present

## 2017-03-17 DIAGNOSIS — Z6841 Body Mass Index (BMI) 40.0 and over, adult: Secondary | ICD-10-CM

## 2017-03-17 DIAGNOSIS — Z888 Allergy status to other drugs, medicaments and biological substances status: Secondary | ICD-10-CM

## 2017-03-17 DIAGNOSIS — I255 Ischemic cardiomyopathy: Secondary | ICD-10-CM | POA: Diagnosis present

## 2017-03-17 DIAGNOSIS — S60511A Abrasion of right hand, initial encounter: Secondary | ICD-10-CM | POA: Diagnosis present

## 2017-03-17 DIAGNOSIS — Z951 Presence of aortocoronary bypass graft: Secondary | ICD-10-CM

## 2017-03-17 DIAGNOSIS — I2510 Atherosclerotic heart disease of native coronary artery without angina pectoris: Secondary | ICD-10-CM | POA: Diagnosis present

## 2017-03-17 DIAGNOSIS — N183 Chronic kidney disease, stage 3 (moderate): Secondary | ICD-10-CM | POA: Diagnosis present

## 2017-03-17 DIAGNOSIS — E876 Hypokalemia: Secondary | ICD-10-CM | POA: Diagnosis present

## 2017-03-17 DIAGNOSIS — E1165 Type 2 diabetes mellitus with hyperglycemia: Secondary | ICD-10-CM | POA: Diagnosis present

## 2017-03-17 DIAGNOSIS — W19XXXA Unspecified fall, initial encounter: Secondary | ICD-10-CM | POA: Diagnosis present

## 2017-03-17 DIAGNOSIS — Z66 Do not resuscitate: Secondary | ICD-10-CM | POA: Diagnosis present

## 2017-03-17 DIAGNOSIS — I5082 Biventricular heart failure: Secondary | ICD-10-CM | POA: Diagnosis present

## 2017-03-17 DIAGNOSIS — S6391XA Sprain of unspecified part of right wrist and hand, initial encounter: Secondary | ICD-10-CM | POA: Diagnosis present

## 2017-03-17 DIAGNOSIS — G5603 Carpal tunnel syndrome, bilateral upper limbs: Secondary | ICD-10-CM | POA: Diagnosis present

## 2017-03-17 DIAGNOSIS — Z7984 Long term (current) use of oral hypoglycemic drugs: Secondary | ICD-10-CM

## 2017-03-17 DIAGNOSIS — L03119 Cellulitis of unspecified part of limb: Secondary | ICD-10-CM | POA: Diagnosis not present

## 2017-03-17 DIAGNOSIS — M7989 Other specified soft tissue disorders: Secondary | ICD-10-CM | POA: Diagnosis present

## 2017-03-17 DIAGNOSIS — W010XXA Fall on same level from slipping, tripping and stumbling without subsequent striking against object, initial encounter: Secondary | ICD-10-CM | POA: Diagnosis present

## 2017-03-17 DIAGNOSIS — K7290 Hepatic failure, unspecified without coma: Secondary | ICD-10-CM | POA: Diagnosis present

## 2017-03-17 DIAGNOSIS — E1169 Type 2 diabetes mellitus with other specified complication: Secondary | ICD-10-CM | POA: Diagnosis present

## 2017-03-17 DIAGNOSIS — M1990 Unspecified osteoarthritis, unspecified site: Secondary | ICD-10-CM | POA: Diagnosis present

## 2017-03-17 DIAGNOSIS — I5042 Chronic combined systolic (congestive) and diastolic (congestive) heart failure: Secondary | ICD-10-CM | POA: Diagnosis present

## 2017-03-17 DIAGNOSIS — E1140 Type 2 diabetes mellitus with diabetic neuropathy, unspecified: Secondary | ICD-10-CM | POA: Diagnosis present

## 2017-03-17 NOTE — ED Triage Notes (Signed)
Pt reports to the ED for eval of right hand pain. She states that on Saturday she fell and caught herself with her right hand she states that her hand has continued to be painful and swollen. She has not taken any OTC pain medications for the pain but she has been putting neosporin on her scrapes.

## 2017-03-18 ENCOUNTER — Encounter (HOSPITAL_COMMUNITY): Payer: Self-pay

## 2017-03-18 ENCOUNTER — Inpatient Hospital Stay (HOSPITAL_COMMUNITY)
Admission: EM | Admit: 2017-03-18 | Discharge: 2017-03-20 | DRG: 603 | Disposition: A | Discharge status: Home Health | Payer: Medicare Other | Attending: Family Medicine | Admitting: Family Medicine

## 2017-03-18 ENCOUNTER — Emergency Department (HOSPITAL_COMMUNITY): Payer: Medicare Other

## 2017-03-18 DIAGNOSIS — K7290 Hepatic failure, unspecified without coma: Secondary | ICD-10-CM | POA: Diagnosis not present

## 2017-03-18 DIAGNOSIS — I272 Pulmonary hypertension, unspecified: Secondary | ICD-10-CM | POA: Diagnosis present

## 2017-03-18 DIAGNOSIS — E876 Hypokalemia: Secondary | ICD-10-CM | POA: Diagnosis not present

## 2017-03-18 DIAGNOSIS — E871 Hypo-osmolality and hyponatremia: Secondary | ICD-10-CM | POA: Diagnosis present

## 2017-03-18 DIAGNOSIS — I130 Hypertensive heart and chronic kidney disease with heart failure and stage 1 through stage 4 chronic kidney disease, or unspecified chronic kidney disease: Secondary | ICD-10-CM | POA: Diagnosis present

## 2017-03-18 DIAGNOSIS — I5042 Chronic combined systolic (congestive) and diastolic (congestive) heart failure: Secondary | ICD-10-CM | POA: Diagnosis present

## 2017-03-18 DIAGNOSIS — I5022 Chronic systolic (congestive) heart failure: Secondary | ICD-10-CM

## 2017-03-18 DIAGNOSIS — I340 Nonrheumatic mitral (valve) insufficiency: Secondary | ICD-10-CM | POA: Diagnosis present

## 2017-03-18 DIAGNOSIS — N183 Chronic kidney disease, stage 3 unspecified: Secondary | ICD-10-CM | POA: Diagnosis present

## 2017-03-18 DIAGNOSIS — S6391XA Sprain of unspecified part of right wrist and hand, initial encounter: Secondary | ICD-10-CM | POA: Diagnosis present

## 2017-03-18 DIAGNOSIS — I5082 Biventricular heart failure: Secondary | ICD-10-CM | POA: Diagnosis present

## 2017-03-18 DIAGNOSIS — IMO0002 Reserved for concepts with insufficient information to code with codable children: Secondary | ICD-10-CM | POA: Diagnosis present

## 2017-03-18 DIAGNOSIS — W010XXA Fall on same level from slipping, tripping and stumbling without subsequent striking against object, initial encounter: Secondary | ICD-10-CM | POA: Diagnosis present

## 2017-03-18 DIAGNOSIS — E1140 Type 2 diabetes mellitus with diabetic neuropathy, unspecified: Secondary | ICD-10-CM

## 2017-03-18 DIAGNOSIS — L03113 Cellulitis of right upper limb: Secondary | ICD-10-CM | POA: Diagnosis present

## 2017-03-18 DIAGNOSIS — E1169 Type 2 diabetes mellitus with other specified complication: Secondary | ICD-10-CM | POA: Diagnosis present

## 2017-03-18 DIAGNOSIS — I255 Ischemic cardiomyopathy: Secondary | ICD-10-CM | POA: Diagnosis present

## 2017-03-18 DIAGNOSIS — W19XXXA Unspecified fall, initial encounter: Secondary | ICD-10-CM | POA: Diagnosis present

## 2017-03-18 DIAGNOSIS — Z66 Do not resuscitate: Secondary | ICD-10-CM | POA: Diagnosis present

## 2017-03-18 DIAGNOSIS — E1165 Type 2 diabetes mellitus with hyperglycemia: Secondary | ICD-10-CM | POA: Diagnosis not present

## 2017-03-18 DIAGNOSIS — E039 Hypothyroidism, unspecified: Secondary | ICD-10-CM

## 2017-03-18 DIAGNOSIS — K7682 Hepatic encephalopathy: Secondary | ICD-10-CM | POA: Diagnosis present

## 2017-03-18 DIAGNOSIS — I2510 Atherosclerotic heart disease of native coronary artery without angina pectoris: Secondary | ICD-10-CM | POA: Diagnosis present

## 2017-03-18 DIAGNOSIS — M7989 Other specified soft tissue disorders: Secondary | ICD-10-CM | POA: Diagnosis present

## 2017-03-18 DIAGNOSIS — E1122 Type 2 diabetes mellitus with diabetic chronic kidney disease: Secondary | ICD-10-CM | POA: Diagnosis present

## 2017-03-18 DIAGNOSIS — I739 Peripheral vascular disease, unspecified: Secondary | ICD-10-CM | POA: Diagnosis present

## 2017-03-18 DIAGNOSIS — L03119 Cellulitis of unspecified part of limb: Secondary | ICD-10-CM | POA: Diagnosis present

## 2017-03-18 DIAGNOSIS — E785 Hyperlipidemia, unspecified: Secondary | ICD-10-CM | POA: Diagnosis present

## 2017-03-18 DIAGNOSIS — Z6841 Body Mass Index (BMI) 40.0 and over, adult: Secondary | ICD-10-CM | POA: Diagnosis not present

## 2017-03-18 LAB — CBC WITH DIFFERENTIAL/PLATELET
BASOS ABS: 0 10*3/uL (ref 0.0–0.1)
Basophils Relative: 0 %
EOS ABS: 0.2 10*3/uL (ref 0.0–0.7)
Eosinophils Relative: 3 %
HCT: 40.7 % (ref 36.0–46.0)
Hemoglobin: 13.4 g/dL (ref 12.0–15.0)
LYMPHS ABS: 1.1 10*3/uL (ref 0.7–4.0)
LYMPHS PCT: 17 %
MCH: 28.6 pg (ref 26.0–34.0)
MCHC: 32.9 g/dL (ref 30.0–36.0)
MCV: 86.8 fL (ref 78.0–100.0)
MONO ABS: 0.6 10*3/uL (ref 0.1–1.0)
MONOS PCT: 10 %
NEUTROS ABS: 4.3 10*3/uL (ref 1.7–7.7)
Neutrophils Relative %: 70 %
PLATELETS: 313 10*3/uL (ref 150–400)
RBC: 4.69 MIL/uL (ref 3.87–5.11)
RDW: 15.8 % — ABNORMAL HIGH (ref 11.5–15.5)
WBC: 6.1 10*3/uL (ref 4.0–10.5)

## 2017-03-18 LAB — GLUCOSE, CAPILLARY
Glucose-Capillary: 177 mg/dL — ABNORMAL HIGH (ref 65–99)
Glucose-Capillary: 148 mg/dL — ABNORMAL HIGH (ref 65–99)
Glucose-Capillary: 132 mg/dL — ABNORMAL HIGH (ref 65–99)
Glucose-Capillary: 205 mg/dL — ABNORMAL HIGH (ref 65–99)

## 2017-03-18 LAB — BASIC METABOLIC PANEL
ANION GAP: 13 (ref 5–15)
BUN: 72 mg/dL — AB (ref 6–20)
CALCIUM: 9.3 mg/dL (ref 8.9–10.3)
CO2: 27 mmol/L (ref 22–32)
Chloride: 92 mmol/L — ABNORMAL LOW (ref 101–111)
Creatinine, Ser: 1.57 mg/dL — ABNORMAL HIGH (ref 0.44–1.00)
GFR calc Af Amer: 39 mL/min — ABNORMAL LOW (ref >60)
GFR, EST NON AFRICAN AMERICAN: 34 mL/min — AB (ref >60)
Glucose, Bld: 241 mg/dL — ABNORMAL HIGH (ref 65–99)
POTASSIUM: 3.1 mmol/L — AB (ref 3.5–5.1)
SODIUM: 132 mmol/L — AB (ref 135–145)

## 2017-03-18 LAB — HEMOGLOBIN A1C
Hgb A1c MFr Bld: 7.6 % — ABNORMAL HIGH (ref 4.8–5.6)
Mean Plasma Glucose: 171 mg/dL

## 2017-03-18 MED ORDER — VANCOMYCIN HCL IN DEXTROSE 1-5 GM/200ML-% IV SOLN
1000.0000 mg | Freq: Once | INTRAVENOUS | Status: AC
  Administered 2017-03-18: 1000 mg via INTRAVENOUS
  Filled 2017-03-18: qty 200

## 2017-03-18 MED ORDER — ACETAMINOPHEN 325 MG PO TABS
650.0000 mg | ORAL_TABLET | Freq: Three times a day (TID) | ORAL | Status: DC | PRN
  Administered 2017-03-18 – 2017-03-20 (×6): 650 mg via ORAL
  Filled 2017-03-18 (×6): qty 2

## 2017-03-18 MED ORDER — ALBUTEROL SULFATE (2.5 MG/3ML) 0.083% IN NEBU: 2.5000 mg | INHALATION_SOLUTION | RESPIRATORY_TRACT | Status: DC | PRN

## 2017-03-18 MED ORDER — INSULIN ASPART 100 UNIT/ML ~~LOC~~ SOLN: 0.0000 [IU] | Freq: Every day | SUBCUTANEOUS | Status: DC

## 2017-03-18 MED ORDER — POTASSIUM CHLORIDE CRYS ER 20 MEQ PO TBCR: 20.0000 meq | EXTENDED_RELEASE_TABLET | Freq: Every day | ORAL | Status: DC

## 2017-03-18 MED ORDER — RIFAXIMIN 550 MG PO TABS
550.0000 mg | ORAL_TABLET | Freq: Two times a day (BID) | ORAL | Status: DC
  Administered 2017-03-18 – 2017-03-20 (×5): 550 mg via ORAL
  Filled 2017-03-18 (×5): qty 1

## 2017-03-18 MED ORDER — LEVOTHYROXINE SODIUM 50 MCG PO TABS
50.0000 ug | ORAL_TABLET | Freq: Every day | ORAL | Status: DC
  Administered 2017-03-18 – 2017-03-20 (×3): 50 ug via ORAL
  Filled 2017-03-18 (×3): qty 1

## 2017-03-18 MED ORDER — INSULIN ASPART 100 UNIT/ML ~~LOC~~ SOLN: 0.0000 [IU] | Freq: Three times a day (TID) | SUBCUTANEOUS | Status: DC

## 2017-03-18 MED ORDER — ONDANSETRON HCL 4 MG/2ML IJ SOLN: 4.0000 mg | Freq: Four times a day (QID) | INTRAMUSCULAR | Status: DC | PRN

## 2017-03-18 MED ORDER — ACETAMINOPHEN 325 MG PO TABS
650.0000 mg | ORAL_TABLET | Freq: Once | ORAL | Status: AC
  Administered 2017-03-18: 650 mg via ORAL
  Filled 2017-03-18: qty 2

## 2017-03-18 MED ORDER — ASPIRIN EC 81 MG PO TBEC
81.0000 mg | DELAYED_RELEASE_TABLET | Freq: Every day | ORAL | Status: DC
  Administered 2017-03-18 – 2017-03-20 (×3): 81 mg via ORAL
  Filled 2017-03-18 (×3): qty 1

## 2017-03-18 MED ORDER — POTASSIUM CHLORIDE CRYS ER 20 MEQ PO TBCR
20.0000 meq | EXTENDED_RELEASE_TABLET | Freq: Two times a day (BID) | ORAL | Status: DC
  Administered 2017-03-18 – 2017-03-20 (×5): 20 meq via ORAL
  Filled 2017-03-18 (×5): qty 1

## 2017-03-18 MED ORDER — PIPERACILLIN-TAZOBACTAM 3.375 G IVPB
3.3750 g | Freq: Three times a day (TID) | INTRAVENOUS | Status: DC
  Administered 2017-03-18 – 2017-03-19 (×5): 3.375 g via INTRAVENOUS
  Filled 2017-03-18 (×8): qty 50

## 2017-03-18 MED ORDER — ENOXAPARIN SODIUM 40 MG/0.4ML ~~LOC~~ SOLN
40.0000 mg | SUBCUTANEOUS | Status: DC
  Filled 2017-03-18: qty 0.4

## 2017-03-18 MED ORDER — ONDANSETRON HCL 4 MG PO TABS: 4.0000 mg | ORAL_TABLET | Freq: Four times a day (QID) | ORAL | Status: DC | PRN

## 2017-03-18 MED ORDER — TORSEMIDE 20 MG PO TABS
80.0000 mg | ORAL_TABLET | Freq: Two times a day (BID) | ORAL | Status: DC
Start: 2017-03-18 — End: 2017-03-20
  Administered 2017-03-18 – 2017-03-20 (×5): 80 mg via ORAL
  Filled 2017-03-18 (×5): qty 4

## 2017-03-18 MED ORDER — PIPERACILLIN-TAZOBACTAM 3.375 G IVPB 30 MIN
3.3750 g | Freq: Once | INTRAVENOUS | Status: AC
  Administered 2017-03-18: 3.375 g via INTRAVENOUS
  Filled 2017-03-18: qty 50

## 2017-03-18 MED ORDER — HYDROCODONE-ACETAMINOPHEN 5-325 MG PO TABS
1.0000 | ORAL_TABLET | ORAL | Status: DC | PRN
  Filled 2017-03-18: qty 1

## 2017-03-18 MED ORDER — POTASSIUM CHLORIDE CRYS ER 20 MEQ PO TBCR
40.0000 meq | EXTENDED_RELEASE_TABLET | ORAL | Status: AC
  Administered 2017-03-18: 40 meq via ORAL
  Filled 2017-03-18: qty 2

## 2017-03-18 MED ORDER — SPIRONOLACTONE 25 MG PO TABS
25.0000 mg | ORAL_TABLET | Freq: Every day | ORAL | Status: DC
  Administered 2017-03-18 – 2017-03-20 (×3): 25 mg via ORAL
  Filled 2017-03-18 (×3): qty 1

## 2017-03-18 MED ORDER — VANCOMYCIN HCL 10 G IV SOLR
1500.0000 mg | INTRAVENOUS | Status: DC
  Administered 2017-03-18 – 2017-03-20 (×2): 1500 mg via INTRAVENOUS
  Filled 2017-03-18 (×2): qty 1500

## 2017-03-18 MED ORDER — ATORVASTATIN CALCIUM 40 MG PO TABS
40.0000 mg | ORAL_TABLET | Freq: Every day | ORAL | Status: DC
Start: 2017-03-18 — End: 2017-03-20
  Administered 2017-03-18 – 2017-03-20 (×3): 40 mg via ORAL
  Filled 2017-03-18 (×3): qty 1

## 2017-03-18 MED ORDER — LACTULOSE 10 GM/15ML PO SOLN
30.0000 g | Freq: Two times a day (BID) | ORAL | Status: DC
  Administered 2017-03-18 – 2017-03-19 (×3): 30 g via ORAL
  Filled 2017-03-18 (×4): qty 45

## 2017-03-18 NOTE — Progress Notes (Signed)
[facility]  Patient Status: Active (receiving services up to time of hospitalization)  [facility] is providing the following services: RN and PT  If patient discharges after hours, please call [phone].   Christine EchevariaKaren Nussbaum 03/18/2017, 11:23 AM

## 2017-03-18 NOTE — Progress Notes (Signed)
Pharmacy Antibiotic Note  Christine Knight is a 66 y.o. female admitted on 03/18/2017 with R hand cellulitis.  Pharmacy has been consulted for Vancomycin and Zosyn dosing.  Vanc 1gm given in ED ~0230 and Zosyn 3.375gm IV given in ED ~0430  Plan: Zosyn 3.375gm IV q8h - doses over 4 hours Vancomycin 1500mg  IV q24h Will f/u micro data, renal function, and pt's clinical condition Vanc trough prn      Temp (24hrs), Avg:98 F (36.7 C), Min:97.6 F (36.4 C), Max:98.4 F (36.9 C)   Recent Labs Lab 03/12/17 0417 03/13/17 0514 03/18/17 0224  WBC  --   --  6.1  CREATININE 1.42* 1.55* 1.57*    Estimated Creatinine Clearance: 40.8 mL/min (A) (by C-G formula based on SCr of 1.57 mg/dL (H)).    Allergies  Allergen Reactions  . Lisinopril Nausea And Vomiting, Other (See Comments) and Cough    Coughing, throwing up, waking up coughing  . Metformin And Related Anaphylaxis    Antimicrobials this admission: 5/8 Vanc >>  5/8 Zosyn >>   Dose adjustments this admission: n/a   Thank you for allowing pharmacy to be a part of this patient's care.  Christoper Fabianaron Kista Robb, PharmD, BCPS Clinical pharmacist, pager [phone] 03/18/2017 5:19 AM

## 2017-03-18 NOTE — ED Notes (Signed)
Attempted Report 

## 2017-03-18 NOTE — Care Management Obs Status (Signed)
MEDICARE OBSERVATION STATUS NOTIFICATION   Patient Details  Name: Christine Knight MRN: [name] Date of Birth: 1950-11-17   Medicare Observation Status Notification Given:  Yes    Lawerance Sabalebbie Folasade Mooty, RN 03/18/2017, 11:55 AM

## 2017-03-18 NOTE — ED Notes (Signed)
Delay in lab draw,  Pt in xray. 

## 2017-03-18 NOTE — ED Notes (Signed)
Patient transported to X-ray 

## 2017-03-18 NOTE — ED Notes (Signed)
Due to pt eating ice unable to get temp 

## 2017-03-18 NOTE — Consult Note (Signed)
Christine Knight is an 66 y.o. female.   Chief Complaint: right hand pain HPI: 66 yo female states she fell on right hand 3 days ago when she tripped.  Began to have swelling the following day.  Aching pain that was at 10/10.  Currently 8/10.  She feels her hand is improving and has noted decrease in the swelling and improvement in range of motion and pain.  No fevers, chills, night sweats.  Does not feel ill.   Case discussed with Oren Binet, MD and his note from 03/18/2017 reviewed. Xrays viewed and interpreted by me: ap/lateral/oblique views right hand show no fractures, dislocations, radioopaque foreign bodies.  Labs reviewed: WBC 6.1  Allergies:  Allergies  Allergen Reactions  . Lisinopril Nausea And Vomiting, Other (See Comments) and Cough    Coughing, throwing up, waking up coughing  . Metformin And Related Anaphylaxis    Past Medical History:  Diagnosis Date  . Arthritis   . CAD (coronary artery disease) 08/2011   NSTEMI with subsequent CABG 08/13/2011 with LIMA-LAD, SVG-diagonal, SVG-OM, SVG-PDA  . Carpal tunnel syndrome on both sides    "post OHS in 08/2011; resolved now" (03/17/2013)  . CHF (congestive heart failure) ([place])   . Chronic systolic heart failure (HCC)    Chronic systolic CHF  . Diabetes mellitus type 2 in obese ([place]) 08/10/11  . Dyspnea   . Ejection fraction < 50%    EF 20%, October, 2012  //  EF 25% December, 2012  //   EF 35%, echo, September, 2013  . Hyperlipidemia 08/10/11  . Hypertension   . Ischemic cardiomyopathy 08/13/2011   EF 20% in 08/2011, still 25% 10/21/2011  . Mitral regurgitation    Mild by echo 10/21/11  . Myocardial infarction ([place]) 08/2011  . Obesity, morbid ([place])   . Orthopnoea    "progressively worse over last 3 wks" (03/17/2013)  . Pleural effusion    Requiring L thoracentesis 09/02/11  . Pulmonary hypertension ([place])    Echo, September, 2013, 72 mmHg.    Past Surgical History:  Procedure Laterality Date  . CARDIAC  CATHETERIZATION N/A 12/13/2015   Procedure: Right/Left Heart Cath and Coronary/Graft Angiography;  Surgeon: Peter M Martinique, MD;  Location: [facility];  Service: Cardiovascular;  Laterality: N/A;  . CHOLECYSTECTOMY    . CORONARY ARTERY BYPASS GRAFT  08/13/11   CABG x4 with LIMA to LAD, SVG to Diag, SVG to OM, SVG to PDA, EVH via both thighs  . LAPAROSCOPY  1980's?   "removed gallstones; not gallbladder" (03/17/2013)    Family History: Family History  Problem Relation Age of Onset  . Heart disease Father   . Hypertension Maternal Grandmother   . Hypertension Sister   . Cancer Neg Hx   . Diabetes Neg Hx   . Kidney disease Neg Hx     Social History:   reports that she quit smoking about 38 years ago. Her smoking use included Cigarettes. She has never used smokeless tobacco. She reports that she drinks alcohol. She reports that she does not use drugs.  Medications: Medications Prior to Admission  Medication Sig Dispense Refill  . acetaminophen (TYLENOL) 650 MG CR tablet Take 650 mg by mouth every 8 (eight) hours as needed (for arthritic pain).     Marland Kitchen aspirin 81 MG tablet Take 1 tablet (81 mg total) by mouth daily. 30 tablet 9  . atorvastatin (LIPITOR) 40 MG tablet Take 1 tablet (40 mg total) by mouth daily. 90 tablet 3  .  glipiZIDE (GLUCOTROL) 5 MG tablet Take 1 tablet (5 mg total) by mouth 2 (two) times daily with a meal. 60 tablet 6  . lactulose (CHRONULAC) 10 GM/15ML solution Take 45 mLs (30 g total) by mouth 2 (two) times daily. 2700 mL 6  . levothyroxine (SYNTHROID, LEVOTHROID) 50 MCG tablet Take 1 tablet (50 mcg total) by mouth daily before breakfast. 30 tablet 6  . Multiple Vitamins-Minerals (CENTRUM SILVER 50+WOMEN PO) Take 1 tablet by mouth every morning.    . potassium chloride SA (K-DUR,KLOR-CON) 20 MEQ tablet Take 1 tablet (20 mEq total) by mouth daily. 30 tablet 6  . rifaximin (XIFAXAN) 550 MG TABS tablet Take 1 tablet (550 mg total) by mouth 2 (two) times daily. 60 tablet  12  . spironolactone (ALDACTONE) 25 MG tablet Take 1 tablet (25 mg total) by mouth daily. 30 tablet 5  . torsemide (DEMADEX) 20 MG tablet Take 4 tablets (80 mg total) by mouth 2 (two) times daily. 240 tablet 12    Results for orders placed or performed during the hospital encounter of 03/18/17 (from the past 48 hour(s))  CBC with Differential/Platelet     Status: Abnormal   Collection Time: 03/18/17  2:24 AM  Result Value Ref Range   WBC 6.1 4.0 - 10.5 K/uL   RBC 4.69 3.87 - 5.11 MIL/uL   Hemoglobin 13.4 12.0 - 15.0 g/dL   HCT 40.7 36.0 - 46.0 %   MCV 86.8 78.0 - 100.0 fL   MCH 28.6 26.0 - 34.0 pg   MCHC 32.9 30.0 - 36.0 g/dL   RDW 15.8 (H) 11.5 - 15.5 %   Platelets 313 150 - 400 K/uL   Neutrophils Relative % 70 %   Neutro Abs 4.3 1.7 - 7.7 K/uL   Lymphocytes Relative 17 %   Lymphs Abs 1.1 0.7 - 4.0 K/uL   Monocytes Relative 10 %   Monocytes Absolute 0.6 0.1 - 1.0 K/uL   Eosinophils Relative 3 %   Eosinophils Absolute 0.2 0.0 - 0.7 K/uL   Basophils Relative 0 %   Basophils Absolute 0.0 0.0 - 0.1 K/uL  Basic metabolic panel     Status: Abnormal   Collection Time: 03/18/17  2:24 AM  Result Value Ref Range   Sodium 132 (L) 135 - 145 mmol/L   Potassium 3.1 (L) 3.5 - 5.1 mmol/L   Chloride 92 (L) 101 - 111 mmol/L   CO2 27 22 - 32 mmol/L   Glucose, Bld 241 (H) 65 - 99 mg/dL   BUN 72 (H) 6 - 20 mg/dL   Creatinine, Ser 1.57 (H) 0.44 - 1.00 mg/dL   Calcium 9.3 8.9 - 10.3 mg/dL   GFR calc non Af Amer 34 (L) >60 mL/min   GFR calc Af Amer 39 (L) >60 mL/min    Comment: (NOTE) The eGFR has been calculated using the CKD EPI equation. This calculation has not been validated in all clinical situations. eGFR's persistently <60 mL/min signify possible Chronic Kidney Disease.    Anion gap 13 5 - 15  Glucose, capillary     Status: Abnormal   Collection Time: 03/18/17  7:10 AM  Result Value Ref Range   Glucose-Capillary 177 (H) 65 - 99 mg/dL  Glucose, capillary     Status: Abnormal    Collection Time: 03/18/17 12:06 PM  Result Value Ref Range   Glucose-Capillary 148 (H) 65 - 99 mg/dL  Glucose, capillary     Status: Abnormal   Collection Time: 03/18/17  4:40 PM  Result Value Ref Range   Glucose-Capillary 132 (H) 65 - 99 mg/dL    Dg Wrist Complete Right  Result Date: 03/18/2017 CLINICAL DATA:  Status post fall, with right wrist pain. Initial encounter. EXAM: RIGHT WRIST - COMPLETE 3+ VIEW COMPARISON:  Right hand radiographs performed 03/17/2017 FINDINGS: There is no evidence of fracture or dislocation. The carpal rows are intact, and demonstrate normal alignment. The joint spaces are preserved. Negative ulnar variance is noted. Soft tissue swelling is noted about the wrist. IMPRESSION: No evidence of fracture or dislocation. Electronically Signed   By: Garald Balding M.D.   On: 03/18/2017 02:13   Dg Hand Complete Right  Result Date: 03/17/2017 CLINICAL DATA:  66 y/o  F; right hand pain after fall. EXAM: RIGHT HAND - COMPLETE 3+ VIEW COMPARISON:  None. FINDINGS: There is no evidence of fracture or dislocation. There is no evidence of arthropathy or other focal bone abnormality. Soft tissues are unremarkable. IMPRESSION: Negative. Electronically Signed   By: Kristine Garbe M.D.   On: 03/17/2017 22:24     A comprehensive review of systems was negative. Review of Systems: No fevers, chills, night sweats, chest pain, shortness of breath, nausea, vomiting, diarrhea, constipation, easy bleeding or bruising, headaches, dizziness, vision changes, fainting.   Blood pressure (!) 95/58, pulse 93, temperature 98.7 F (37.1 C), temperature source Oral, resp. rate 17, height _0  (1.676 m), weight 91.7 kg (202 lb 1.6 oz), SpO2 98 %.  General appearance: alert, cooperative and appears stated age Head: Normocephalic, without obvious abnormality, atraumatic Neck: supple, symmetrical, trachea midline Extremities: Intact sensation and capillary refill all digits.  +epl/fpl/io.   Small abrasions dorsoulnar side and in palm.  Hand and digits swollen.  No point tenderness.  No erythema or proximal streaking.  Able to move digits and wrist with minimal pain until limits of motion.  No fluctuance. Pulses: 2+ and symmetric Skin: Skin color, texture, turgor normal. No rashes or lesions Neurologic: Grossly normal Incision/Wound: As above  Assessment/Plan Right hand sprain/contusion.  Don't feel there is abscess requiring drainage.  May use wrist splint for comfort, though IV would need to be moved.  Will follow.  May follow up as outpatient.  Adrijana Haros R 03/18/2017, 5:54 PM

## 2017-03-18 NOTE — ED Notes (Signed)
Nurse currently drawing labs 

## 2017-03-18 NOTE — H&P (Signed)
History and Physical    Christine Knight AOZ:[id number] DOB: 1951/09/11 DOA: 03/18/2017  Referring MD/NP/PA: Felicie Morn, NP PCP: Coralee Rud, PA-C  Patient coming from: Home  Chief Complaint: Right hand swelling  HPI: Christine Knight is a 66 y.o. female with medical history significant of combined systolic and diastolic CHF(last EF 15-20% with grade 2 dFx in 04/2016), ICM, CAD with H/O NSTEMI s/p CABG, PAH, and DM type 2; who presents with complaints of right hand swelling. Symptoms started after she fell 3 days ago, landing on her right hand. At that time she noticed that she had some abrasions on the palm iron dose aspects of her hand. She been applying Neosporin to the abrasions. However, noted acute worsening of swelling and pain. Associated symptoms included erythema and increased warmth. Patient denies having fever, chills, nausea, vomiting, or drainage from the wounds.   ED Course: Upon admission the emergency department patient was seen to be afebrile with all other vitals  relatively within normal limits. Labs revealed CBC wnl, sodium 132, potassium 3.1, chloride 9.2, BUN 72, creatinine 1.57, glucose 241. X-ray imaging of the right hand and wrist showed no acute abnormalities. Patient was started on antibiotics vancomycin and Zosyn in the ED.   Review of Systems: As per HPI otherwise 10 point review of systems negative.   Past Medical History:  Diagnosis Date  . Arthritis   . CAD (coronary artery disease) 08/2011   NSTEMI with subsequent CABG 08/13/2011 with LIMA-LAD, SVG-diagonal, SVG-OM, SVG-PDA  . Carpal tunnel syndrome on both sides    "post OHS in 08/2011; resolved now" (03/17/2013)  . CHF (congestive heart failure) (HCC)   . Chronic systolic heart failure (HCC)    Chronic systolic CHF  . Diabetes mellitus type 2 in obese (H[facility]) 08/10/11  . Dyspnea   . Ejection fraction < 50%    EF 20%, October, 2012  //  EF 25% December, 2012  //   EF 35%, echo, September, 2013  .  Hyperlipidemia 08/10/11  . Hypertension   . Ischemic cardiomyopathy 08/13/2011   EF 20% in 08/2011, still 25% 10/21/2011  . Mitral regurgitation    Mild by echo 10/21/11  . Myocardial infarction (HCC) 08/2011  . Obesity, morbid (HCC)   . Orthopnoea    "progressively worse over last 3 wks" (03/17/2013)  . Pleural effusion    Requiring L thoracentesis 09/02/11  . Pulmonary hypertension (HCC)    Echo, September, 2013, 72 mmHg.    Past Surgical History:  Procedure Laterality Date  . CARDIAC CATHETERIZATION N/A 12/13/2015   Procedure: Right/Left Heart Cath and Coronary/Graft Angiography;  Surgeon: Peter M Swaziland, MD;  Location: [facility];  Service: Cardiovascular;  Laterality: N/A;  . CHOLECYSTECTOMY    . CORONARY ARTERY BYPASS GRAFT  08/13/11   CABG x4 with LIMA to LAD, SVG to Diag, SVG to OM, SVG to PDA, EVH via both thighs  . LAPAROSCOPY  1980's?   "removed gallstones; not gallbladder" (03/17/2013)     reports that she quit smoking about 38 years ago. Her smoking use included Cigarettes. She has never used smokeless tobacco. She reports that she drinks alcohol. She reports that she does not use drugs.  Allergies  Allergen Reactions  . Lisinopril Nausea And Vomiting, Other (See Comments) and Cough    Coughing, throwing up, waking up coughing  . Metformin And Related Anaphylaxis    Family History  Problem Relation Age of Onset  . Heart disease Father   . Hypertension  Maternal Grandmother   . Hypertension Sister   . Cancer Neg Hx   . Diabetes Neg Hx   . Kidney disease Neg Hx     Prior to Admission medications   Medication Sig Start Date End Date Taking? Authorizing Provider  acetaminophen (TYLENOL) 650 MG CR tablet Take 650 mg by mouth every 8 (eight) hours as needed (for arthritic pain).    Yes [provider]  aspirin 81 MG tablet Take 1 tablet (81 mg total) by mouth daily. 11/15/13  Yes Luis AbedKatz, Jeffrey D, MD  atorvastatin (LIPITOR) 40 MG tablet Take 1 tablet (40  mg total) by mouth daily. 11/22/16 02/25/22 Yes Tillery, Mariam DollarMichael Andrew, PA-C  glipiZIDE (GLUCOTROL) 5 MG tablet Take 1 tablet (5 mg total) by mouth 2 (two) times daily with a meal. 03/13/17  Yes Clegg, Amy D, NP  lactulose (CHRONULAC) 10 GM/15ML solution Take 45 mLs (30 g total) by mouth 2 (two) times daily. 01/30/17  Yes Graciella Freerillery, [name] Andrew, PA-C  levothyroxine (SYNTHROID, LEVOTHROID) 50 MCG tablet Take 1 tablet (50 mcg total) by mouth daily before breakfast. 03/13/17  Yes Clegg, Amy D, NP  Multiple Vitamins-Minerals (CENTRUM SILVER 50+WOMEN PO) Take 1 tablet by mouth every morning.   Yes [provider]  potassium chloride SA (K-DUR,KLOR-CON) 20 MEQ tablet Take 1 tablet (20 mEq total) by mouth daily. 02/05/17  Yes Clegg, Amy D, NP  rifaximin (XIFAXAN) 550 MG TABS tablet Take 1 tablet (550 mg total) by mouth 2 (two) times daily. 01/17/17  Yes Little IshikawaSmith, Erin E, NP  spironolactone (ALDACTONE) 25 MG tablet Take 1 tablet (25 mg total) by mouth daily. 11/06/16  Yes Bensimhon, Bevelyn Bucklesaniel R, MD  torsemide (DEMADEX) 20 MG tablet Take 4 tablets (80 mg total) by mouth 2 (two) times daily. 01/17/17  Yes Little IshikawaSmith, Erin E, NP    Physical Exam: Vitals:   03/17/17 2154 03/18/17 0318  BP: 106/80 (!) 99/58  Pulse: 82 60  Resp: 16 18  Temp: 97.6 F (36.4 C)   TempSrc: Oral   SpO2: 99% 98%      Constitutional: NAD, calm, comfortable Vitals:   03/17/17 2154 03/18/17 0318  BP: 106/80 (!) 99/58  Pulse: 82 60  Resp: 16 18  Temp: 97.6 F (36.4 C)   TempSrc: Oral   SpO2: 99% 98%   Eyes: PERRL, lids and conjunctivae normal ENMT: Mucous membranes are moist. Posterior pharynx clear of any exudate or lesions.Normal dentition.  Neck: normal, supple, no masses, no thyromegaly Respiratory: clear to auscultation bilaterally, no wheezing, no crackles. Normal respiratory effort. No accessory muscle use.  Cardiovascular: Regular rate and rhythm, no murmurs / rubs / gallops. No extremity edema. 2+ pedal pulses. No  carotid bruits.  Abdomen: no tenderness, no masses palpated. No hepatosplenomegaly. Bowel sounds positive.  Musculoskeletal: no clubbing / cyanosis. No joint deformity upper and lower extremities.Decreased range of motion of the right hand due to swelling and pain. Skin: Abrasions noted to the palmaris and dorsal aspect of the right hand with erythema and increased warmth noted.  Neurologic: CN 2-12 grossly intact. Sensation intact, DTR normal. Strength 5/5 in all 4.  Psychiatric: Normal judgment and insight. Alert and oriented x 3. Normal mood.     Labs on Admission: I have personally reviewed following labs and imaging studies  CBC:  Recent Labs Lab 03/18/17 0224  WBC 6.1  NEUTROABS 4.3  HGB 13.4  HCT 40.7  MCV 86.8  PLT 313   Basic Metabolic Panel:  Recent Labs Lab 03/11/17  1610 03/12/17 0417 03/13/17 0514 03/18/17 0224  NA 131* 130* 131* 132*  K 3.1* 3.1* 3.3* 3.1*  CL 84* 84* 88* 92*  CO2 40* 37* 34* 27  GLUCOSE 118* 175* 90 241*  BUN 41* 42* 47* 72*  CREATININE 1.30* 1.42* 1.55* 1.57*  CALCIUM 8.7* 8.7* 8.9 9.3  MG 2.2  --   --   --    GFR: Estimated Creatinine Clearance: 40.8 mL/min (A) (by C-G formula based on SCr of 1.57 mg/dL (H)). Liver Function Tests: No results for input(s): AST, ALT, ALKPHOS, BILITOT, PROT, ALBUMIN in the last 168 hours. No results for input(s): LIPASE, AMYLASE in the last 168 hours. No results for input(s): AMMONIA in the last 168 hours. Coagulation Profile: No results for input(s): INR, PROTIME in the last 168 hours. Cardiac Enzymes: No results for input(s): CKTOTAL, CKMB, CKMBINDEX, TROPONINI in the last 168 hours. BNP (last 3 results) No results for input(s): PROBNP in the last 8760 hours. HbA1C: No results for input(s): HGBA1C in the last 72 hours. CBG:  Recent Labs Lab 03/12/17 1547 03/12/17 2049 03/13/17 0734 03/13/17 0908 03/13/17 1609  GLUCAP 169* 135* 67 101* 200*   Lipid Profile: No results for input(s):  CHOL, HDL, LDLCALC, TRIG, CHOLHDL, LDLDIRECT in the last 72 hours. Thyroid Function Tests: No results for input(s): TSH, T4TOTAL, FREET4, T3FREE, THYROIDAB in the last 72 hours. Anemia Panel: No results for input(s): VITAMINB12, FOLATE, FERRITIN, TIBC, IRON, RETICCTPCT in the last 72 hours. Urine analysis:    Component Value Date/Time   COLORURINE RED (A) 04/15/2016 1733   APPEARANCEUR CLOUDY (A) 04/15/2016 1733   LABSPEC 1.009 04/15/2016 1733   PHURINE 7.0 04/15/2016 1733   GLUCOSEU NEGATIVE 04/15/2016 1733   GLUCOSEU NEGATIVE 10/10/2014 1649   HGBUR LARGE (A) 04/15/2016 1733   BILIRUBINUR NEGATIVE 04/15/2016 1733   KETONESUR NEGATIVE 04/15/2016 1733   PROTEINUR NEGATIVE 04/15/2016 1733   UROBILINOGEN 1.0 05/29/2015 1414   NITRITE NEGATIVE 04/15/2016 1733   LEUKOCYTESUR SMALL (A) 04/15/2016 1733   Sepsis Labs: No results found for this or any previous visit (from the past 240 hour(s)).   Radiological Exams on Admission: Dg Wrist Complete Right  Result Date: 03/18/2017 CLINICAL DATA:  Status post fall, with right wrist pain. Initial encounter. EXAM: RIGHT WRIST - COMPLETE 3+ VIEW COMPARISON:  Right hand radiographs performed 03/17/2017 FINDINGS: There is no evidence of fracture or dislocation. The carpal rows are intact, and demonstrate normal alignment. The joint spaces are preserved. Negative ulnar variance is noted. Soft tissue swelling is noted about the wrist. IMPRESSION: No evidence of fracture or dislocation. Electronically Signed   By: Roanna Raider M.D.   On: 03/18/2017 02:13   Dg Hand Complete Right  Result Date: 03/17/2017 CLINICAL DATA:  66 y/o  F; right hand pain after fall. EXAM: RIGHT HAND - COMPLETE 3+ VIEW COMPARISON:  None. FINDINGS: There is no evidence of fracture or dislocation. There is no evidence of arthropathy or other focal bone abnormality. Soft tissues are unremarkable. IMPRESSION: Negative. Electronically Signed   By: Mitzi Hansen M.D.   On:  03/17/2017 22:24    Assessment/Plan Cellulitis of the right wrist: Acute. Patient presents with acute right hand swelling and erythema following fall. - Admit to MedSurg.  - Cellulitis order set initiated - Continue empiric antibiotics of Vancomycin and Zosyn - Consider need of consult to hand surgery in a.m.  Hypokalemia: Acute. Initial potassium 3.1 on admission.  - Give 40 mEq of potassium chloride 1 dose now -  Continue to monitor and replace as needed  Chronic systolic and diastolic CHF/ CAD s/p CABG: Patient's last EF noted to be 15-20%. Patient is followed by Dr. Gala Romney in outpatient clinic. - Strict ins and outs - Continued torsemide, spironolactone, potassium chloride - May want to notify cardiology in a.m. to question need of adjustment of medications to  progressively worsening kidney function.  Chronic kidney disease stage III: Patient presents with a creatinine of 1.57 and a BUN of 72 on admission.  Diabetes mellitus type 2: Uncontrolled. Patient presents with a blood glucose of 241 on admission. Reports taking medications of glipizide at home. - Hypoglycemic protocol  - Held glipizide  - CBGs every before meals and at bedtime with Sensitive SSI  H/O hepatic encephalopathy - Continue lactulose and rifaximin  Hyperlipidemia - Continue atorvastatin  Hypothyroidism - Continue levothyroxine   DVT prophylaxis: Lovenox  Code Status: DNR Family Communication: No family present at bedside Disposition Plan: Likely discharge home in 1-2 days  Consults called: None  Admission status: observation  Clydie Braun MD [facility] Pager [phone]  If 7PM-7AM, please contact night-coverage [email or web address] Password [email or web address]  03/18/2017, 3:20 AM

## 2017-03-18 NOTE — Progress Notes (Signed)
PROGRESS NOTE        PATIENT DETAILS Name: Christine Knight Age: 66 y.o. Sex: female Date of Birth: 04/13/1951 Admit Date: 03/18/2017 Admitting Physician Clydie Braun, MD ZOX:[id number], Suszanne Conners, PA-C  Brief Narrative: Patient is a 66 y.o. female chronic systolic heart failure, CAD status post CABG in 2012, morbid obesity, chronic kidney disease stage III admitted with right hand swelling-following a mechanical fall this past weekend (fell on her outstretched right hand). See below for further details.  Subjective: Per patient-Right hand swelling has slightly worsened per patient. Continues to have significant pain even with passive flexion.  No chest pain No SOB No headache No Nausea vomiting or diarrhea   Assessment/Plan: Right hand swelling/pain probably secondary to soft tissue infection: No evidence of abscess on my exam-continue empiric vancomycin and Zosyn. X-ray of right wrist/hand negative for fractures. She does have some abrasions both on palmar and dorsal aspect of the hand from her recent fall this past weekend.. Her documentation in Epic-last tetanus shot was in 2015. Have consulted hand surgery for further evaluation-Dr. Kuzma-suggested that we keep patient nothing by mouth until he evaluates the patient later today.  Chronic biventricular heart failure (EF 15-20% per echo on 04/14/16): Has mild lower extremity edema-continue Demadex. Weight on most recent discharge 200 pounds-weight today 202 pounds. Follow closely, if weight continues to increase, may need to adjust diuretic regimen. Will add telemetry monitoring.  Severe pulmonary hypertension: Chronic issues-seen on last echo as well. Likely secondary to severe cardiomyopathy.  Chronic kidney disease stage III: Creatinine close to usual baseline, follow.  Hypothyroidism: Recently started on levothyroxine, follow TSH in 3 months.  Hypokalemia: Increased schedule Lasix to twice a day-recheck  chemistry panel tomorrow morning.  Mild hyponatremia: Suspect secondary to CHF, follow. Maintain fluid restriction  CAD status post CABG in 2012: No anginal symptoms-Continue aspirin, statin  History of hepatic encephalopathy: Continue rifaximin and lactulose. Awake and alert this morning.  Echo 04/14/16 (reviewed): EF 15-20%  Old records review: prior d/c summary done on 5/3   DVT Prophylaxis: Prophylactic Lovenox  Code Status: Full code   Family Communication: None at bedside  Disposition Plan: Remain inpatient-home when more clinically improved  Antimicrobial agents: Anti-infectives    Start     Dose/Rate Route Frequency Ordered Stop   03/19/17 0000  vancomycin (VANCOCIN) 1,500 mg in sodium chloride 0.9 % 500 mL IVPB     1,500 mg 250 mL/hr over 120 Minutes Intravenous Every 24 hours 03/18/17 0525     03/18/17 1400  piperacillin-tazobactam (ZOSYN) IVPB 3.375 g     3.375 g 12.5 mL/hr over 240 Minutes Intravenous Every 8 hours 03/18/17 0525     03/18/17 1000  rifaximin (XIFAXAN) tablet 550 mg     550 mg Oral 2 times daily 03/18/17 0516     03/18/17 0315  piperacillin-tazobactam (ZOSYN) IVPB 3.375 g     3.375 g 100 mL/hr over 30 Minutes Intravenous  Once 03/18/17 0306 03/18/17 0502   03/18/17 0200  vancomycin (VANCOCIN) IVPB 1000 mg/200 mL premix     1,000 mg 200 mL/hr over 60 Minutes Intravenous  Once 03/18/17 0200 03/18/17 0428      Procedures: None  CONSULTS:  orthopedic surgery  Time spent: 25 minutes-Greater than 50% of this time was spent in counseling, explanation of diagnosis, planning of further management, and coordination of care.  MEDICATIONS: Scheduled Meds: . aspirin EC  81 mg Oral Daily  . atorvastatin  40 mg Oral Daily  . enoxaparin (LOVENOX) injection  40 mg Subcutaneous Q24H  . insulin aspart  0-5 Units Subcutaneous QHS  . insulin aspart  0-9 Units Subcutaneous TID WC  . lactulose  30 g Oral BID  . levothyroxine  50 mcg Oral QAC breakfast    . potassium chloride SA  20 mEq Oral BID  . rifaximin  550 mg Oral BID  . spironolactone  25 mg Oral Daily  . torsemide  80 mg Oral BID   Continuous Infusions: . piperacillin-tazobactam (ZOSYN)  IV    . [START ON 03/19/2017] vancomycin     PRN Meds:.acetaminophen, albuterol, HYDROcodone-acetaminophen, ondansetron **OR** ondansetron (ZOFRAN) IV   PHYSICAL EXAM: Vital signs: Vitals:   03/18/17 0318 03/18/17 0509 03/18/17 0552 03/18/17 0823  BP: (!) 99/58 (!) 100/53 103/71   Pulse: 60 (!) 57 93   Resp: 18  18   Temp:  98.4 F (36.9 C) 98.6 F (37 C)   TempSrc:  Oral Oral   SpO2: 98% 99% 100%   Weight:    91.7 kg (202 lb 1.6 oz)  Height:   5\' 6"  (1.676 m)    Filed Weights   03/18/17 0823  Weight: 91.7 kg (202 lb 1.6 oz)   Body mass index is 32.62 kg/m.   General appearance :Awake, alert, not in any distress. Speech Clear. Not toxic Looking Eyes:, pupils equally reactive to light and accomodation,no scleral icterus.Pink conjunctiva HEENT: Atraumatic and Normocephalic Neck: supple, no JVD. No cervical lymphadenopathy. No thyromegaly Resp:Good air entry bilaterally, no added sounds  CVS: S1 S2 regular, no murmurs.  GI: Bowel sounds present, Non tender and not distended with no gaurding, rigidity or rebound.No organomegaly Extremities: B/L Lower Ext shows no edema, both legs are warm to touch Right hand: Swollen in both palmar and dorsal aspects-all 5 fingers also swollen. Has abrasions on the dorsum and palmar aspect. Tender passive flexion. No fluctuation. Neurology:  speech clear,Non focal, sensation is grossly intact. Psychiatric: Normal judgment and insight. Alert and oriented x 3. Normal mood. Musculoskeletal:No digital cyanosis Skin:No Rash, warm and dry Wounds:N/A  I have personally reviewed following labs and imaging studies  LABORATORY DATA: CBC:  Recent Labs Lab 03/18/17 0224  WBC 6.1  NEUTROABS 4.3  HGB 13.4  HCT 40.7  MCV 86.8  PLT 313    Basic  Metabolic Panel:  Recent Labs Lab 03/12/17 0417 03/13/17 0514 03/18/17 0224  NA 130* 131* 132*  K 3.1* 3.3* 3.1*  CL 84* 88* 92*  CO2 37* 34* 27  GLUCOSE 175* 90 241*  BUN 42* 47* 72*  CREATININE 1.42* 1.55* 1.57*  CALCIUM 8.7* 8.9 9.3    GFR: Estimated Creatinine Clearance: 40.8 mL/min (A) (by C-G formula based on SCr of 1.57 mg/dL (H)).  Liver Function Tests: No results for input(s): AST, ALT, ALKPHOS, BILITOT, PROT, ALBUMIN in the last 168 hours. No results for input(s): LIPASE, AMYLASE in the last 168 hours. No results for input(s): AMMONIA in the last 168 hours.  Coagulation Profile: No results for input(s): INR, PROTIME in the last 168 hours.  Cardiac Enzymes: No results for input(s): CKTOTAL, CKMB, CKMBINDEX, TROPONINI in the last 168 hours.  BNP (last 3 results) No results for input(s): PROBNP in the last 8760 hours.  HbA1C: No results for input(s): HGBA1C in the last 72 hours.  CBG:  Recent Labs Lab 03/13/17 0734 03/13/17 0908 03/13/17 1609  03/18/17 0710 03/18/17 1206  GLUCAP 67 101* 200* 177* 148*    Lipid Profile: No results for input(s): CHOL, HDL, LDLCALC, TRIG, CHOLHDL, LDLDIRECT in the last 72 hours.  Thyroid Function Tests: No results for input(s): TSH, T4TOTAL, FREET4, T3FREE, THYROIDAB in the last 72 hours.  Anemia Panel: No results for input(s): VITAMINB12, FOLATE, FERRITIN, TIBC, IRON, RETICCTPCT in the last 72 hours.  Urine analysis:    Component Value Date/Time   COLORURINE RED (A) 04/15/2016 1733   APPEARANCEUR CLOUDY (A) 04/15/2016 1733   LABSPEC 1.009 04/15/2016 1733   PHURINE 7.0 04/15/2016 1733   GLUCOSEU NEGATIVE 04/15/2016 1733   GLUCOSEU NEGATIVE 10/10/2014 1649   HGBUR LARGE (A) 04/15/2016 1733   BILIRUBINUR NEGATIVE 04/15/2016 1733   KETONESUR NEGATIVE 04/15/2016 1733   PROTEINUR NEGATIVE 04/15/2016 1733   UROBILINOGEN 1.0 05/29/2015 1414   NITRITE NEGATIVE 04/15/2016 1733   LEUKOCYTESUR SMALL (A) 04/15/2016 1733     Sepsis Labs: Lactic Acid, Venous No results found for: LATICACIDVEN  MICROBIOLOGY: No results found for this or any previous visit (from the past 240 hour(s)).  RADIOLOGY STUDIES/RESULTS: Dg Chest 2 View  Result Date: 03/04/2017 CLINICAL DATA:  Acute on chronic systolic CHF, swelling in abdomen and lower extremities, history CHF, hypertension, diabetes mellitus, coronary artery disease post MI, ischemic cardiomyopathy EXAM: CHEST  2 VIEW COMPARISON:  01/03/2017 FINDINGS: Enlargement of cardiac silhouette post CABG. Pulmonary vascular congestion. Atherosclerotic calcification aorta. Mediastinal contours normal. Mild perihilar edema consistent with CHF, little changed. Small LEFT pleural effusion. No pneumothorax. Bones unremarkable. IMPRESSION: Persistent mild CHF. Electronically Signed   By: Ulyses Southward M.D.   On: 03/04/2017 12:15   Dg Wrist Complete Right  Result Date: 03/18/2017 CLINICAL DATA:  Status post fall, with right wrist pain. Initial encounter. EXAM: RIGHT WRIST - COMPLETE 3+ VIEW COMPARISON:  Right hand radiographs performed 03/17/2017 FINDINGS: There is no evidence of fracture or dislocation. The carpal rows are intact, and demonstrate normal alignment. The joint spaces are preserved. Negative ulnar variance is noted. Soft tissue swelling is noted about the wrist. IMPRESSION: No evidence of fracture or dislocation. Electronically Signed   By: Roanna Raider M.D.   On: 03/18/2017 02:13   Dg Chest Port 1 View  Result Date: 03/04/2017 CLINICAL DATA:  66 year old female under evaluation for potential PICC placement. EXAM: PORTABLE CHEST 1 VIEW COMPARISON:  Chest x-ray 03/04/2017. FINDINGS: There is a left upper extremity PICC with tip terminating in the superior cavoatrial junction. Lung volumes are normal. No consolidative airspace disease. Small left pleural effusion. There is cephalization of the pulmonary vasculature and slight indistinctness of the interstitial markings  suggestive of mild pulmonary edema. Mild cardiomegaly. Aortic atherosclerosis. Status post median sternotomy for CABG. IMPRESSION: 1. Tip of left upper extremity PICC is at the superior cavoatrial junction. 2. The appearance the chest is compatible with mild congestive heart failure, with improvement compared to the prior study. 3. Small left pleural effusion is unchanged. 4. Aortic atherosclerosis. Electronically Signed   By: Trudie Reed M.D.   On: 03/04/2017 21:22   Dg Hand Complete Right  Result Date: 03/17/2017 CLINICAL DATA:  66 y/o  F; right hand pain after fall. EXAM: RIGHT HAND - COMPLETE 3+ VIEW COMPARISON:  None. FINDINGS: There is no evidence of fracture or dislocation. There is no evidence of arthropathy or other focal bone abnormality. Soft tissues are unremarkable. IMPRESSION: Negative. Electronically Signed   By: Mitzi Hansen M.D.   On: 03/17/2017 22:24     LOS:  0 days   Jeoffrey MassedShanker Ghimire, MD  [facility] Pager:[phone]  If 7PM-7AM, please contact night-coverage [email or web address] Password [email or web address] 03/18/2017, 1:04 PM

## 2017-03-18 NOTE — ED Provider Notes (Signed)
[facility]-EMERGENCY DEPT Provider Note   CSN: [id number] date & time: 03/17/17  2147     History   Chief Complaint Chief Complaint  Patient presents with  . Hand Injury    HPI Lum BabeBarbara Stanke is a 66 y.o. female.  Patient states she fell while shopping on Saturday, landing on right hand. She did not notice any injury to the hand until she returned home, when she noticed some abrasions to the dorsal surface.  She has been applying neosporin to the abrasions. Her hand has become edematous with increasing pain and warmth. Patient is diabetic.   The history is provided by the patient. No language interpreter was used.  Hand Injury   The incident occurred more than 2 days ago. The injury mechanism was a fall. The pain is present in the right hand. The quality of the pain is described as throbbing. The pain is moderate. The pain has been fluctuating since the incident. She reports no foreign bodies present. The symptoms are aggravated by movement.    Past Medical History:  Diagnosis Date  . Arthritis   . CAD (coronary artery disease) 08/2011   NSTEMI with subsequent CABG 08/13/2011 with LIMA-LAD, SVG-diagonal, SVG-OM, SVG-PDA  . Carpal tunnel syndrome on both sides    "post OHS in 08/2011; resolved now" (03/17/2013)  . CHF (congestive heart failure) (HCC)   . Chronic systolic heart failure (HCC)    Chronic systolic CHF  . Diabetes mellitus type 2 in obese (HCC) 08/10/11  . Dyspnea   . Ejection fraction < 50%    EF 20%, October, 2012  //  EF 25% December, 2012  //   EF 35%, echo, September, 2013  . Hyperlipidemia 08/10/11  . Hypertension   . Ischemic cardiomyopathy 08/13/2011   EF 20% in 08/2011, still 25% 10/21/2011  . Mitral regurgitation    Mild by echo 10/21/11  . Myocardial infarction (HCC) 08/2011  . Obesity, morbid (HCC)   . Orthopnoea    "progressively worse over last 3 wks" (03/17/2013)  . Pleural effusion    Requiring L thoracentesis 09/02/11  . Pulmonary  hypertension (HCC)    Echo, September, 2013, 72 mmHg.    Patient Active Problem List   Diagnosis Date Noted  . Acute on chronic systolic CHF (congestive heart failure) (HCC) 03/04/2017  . OSA (obstructive sleep apnea) 02/25/2017  . RVF (right ventricular failure) (HCC)   . Hepatic encephalopathy (HCC) 01/03/2017  . Systolic CHF, acute on chronic (HCC) 01/02/2017  . Acute on chronic systolic ACC/AHA stage C congestive heart failure (HCC) 12/06/2016  . CAD (coronary artery disease) 12/06/2016  . Suspected sleep apnea 11/21/2016  . Acute on chronic diastolic CHF (congestive heart failure) (HCC) 08/26/2016  . CHF (congestive heart failure) (HCC) 08/26/2016  . CKD (chronic kidney disease) stage 3, GFR 30-59 ml/min 05/02/2016  . Chest tightness   . Mass of lower lobe of left lung 07/25/2015  . Primary gout 07/25/2015  . NSTEMI- declined cath 05/29/2015  . DJD (degenerative joint disease), multiple sites 11/07/2014  . Painful diabetic neuropathy (HCC) 10/10/2014  . Hyperkalemia 06/17/2013  . Chronic systolic heart failure (HCC) 04/02/2013  . Hypothyroidism 03/18/2013  . Pulmonary hypertension (HCC)   . Hx of CABG 2012 08/30/2011  . Obesity, morbid-BMI 45   . Type 2 diabetes, uncontrolled, with neuropathy (HCC)   . Ischemic cardiomyopathy-30-35% May 2016 08/13/2011  . Hyperlipidemia LDL goal <70 08/10/2011    Past Surgical History:  Procedure Laterality Date  . CARDIAC  CATHETERIZATION N/A 12/13/2015   Procedure: Right/Left Heart Cath and Coronary/Graft Angiography;  Surgeon: Peter M Swaziland, MD;  Location[facility] INVASIVE CV LAB;  Service: Cardiovascular;  Laterality: N/A;  . CHOLECYSTECTOMY    . CORONARY ARTERY BYPASS GRAFT  08/13/11   CABG x4 with LIMA to LAD, SVG to Diag, SVG to OM, SVG to PDA, EVH via both thighs  . LAPAROSCOPY  1980's?   "removed gallstones; not gallbladder" (03/17/2013)    OB History    No data available       Home Medications    Prior to Admission  medications   Medication Sig Start Date End Date Taking? Authorizing Provider  acetaminophen (TYLENOL) 650 MG CR tablet Take 650 mg by mouth every 8 (eight) hours as needed (for arthritic pain).     [provider]  aspirin 81 MG tablet Take 1 tablet (81 mg total) by mouth daily. 11/15/13   Luis Abed, MD  atorvastatin (LIPITOR) 40 MG tablet Take 1 tablet (40 mg total) by mouth daily. 11/22/16 02/25/22  [name], PA-C  gabapentin (NEURONTIN) 300 MG capsule Take 300 mg by mouth 3 (three) times daily.     [provider]  glipiZIDE (GLUCOTROL) 5 MG tablet Take 1 tablet (5 mg total) by mouth 2 (two) times daily with a meal. 03/13/17   Clegg, Amy D, NP  lactulose (CHRONULAC) 10 GM/15ML solution Take 45 mLs (30 g total) by mouth 2 (two) times daily. 01/30/17   [name], PA-C  levothyroxine (SYNTHROID, LEVOTHROID) 50 MCG tablet Take 1 tablet (50 mcg total) by mouth daily before breakfast. 03/13/17   Clegg, Amy D, NP  Multiple Vitamins-Minerals (CENTRUM SILVER 50+WOMEN PO) Take 1 tablet by mouth every morning.    [provider]  potassium chloride SA (K-DUR,KLOR-CON) 20 MEQ tablet Take 1 tablet (20 mEq total) by mouth daily. 02/05/17   Clegg, Amy D, NP  rifaximin (XIFAXAN) 550 MG TABS tablet Take 1 tablet (550 mg total) by mouth 2 (two) times daily. 01/17/17   Little Ishikawa, NP  spironolactone (ALDACTONE) 25 MG tablet Take 1 tablet (25 mg total) by mouth daily. 11/06/16   Bensimhon, Bevelyn Buckles, MD  torsemide (DEMADEX) 20 MG tablet Take 4 tablets (80 mg total) by mouth 2 (two) times daily. 01/17/17   Little Ishikawa, NP    Family History Family History  Problem Relation Age of Onset  . Heart disease Father   . Hypertension Maternal Grandmother   . Hypertension Sister   . Cancer Neg Hx   . Diabetes Neg Hx   . Kidney disease Neg Hx     Social History Social History  Substance Use Topics  . Smoking status: Former Smoker    Types: Cigarettes    Quit  date: 11/11/1978  . Smokeless tobacco: Never Used     Comment: 03/17/2013 "only a social smoker when I did smoke; ever bought any"  . Alcohol use Yes     Comment: OCCASIONAL     Allergies   Lisinopril and Metformin and related   Review of Systems Review of Systems  Musculoskeletal: Positive for arthralgias.  All other systems reviewed and are negative.    Physical Exam Updated Vital Signs BP 106/80 (BP Location: Right Arm)   Pulse 82   Temp 97.6 F (36.4 C) (Oral)   Resp 16   SpO2 99%   Physical Exam  Constitutional: She is oriented to person, place, and time. She appears well-developed and well-nourished.  HENT:  Head: Atraumatic.  Eyes: Conjunctivae are normal.  Neck: Neck supple.  Cardiovascular: Normal rate and regular rhythm.   Pulmonary/Chest: Effort normal and breath sounds normal.  Abdominal: Soft. Bowel sounds are normal.  Musculoskeletal: She exhibits edema and tenderness.       Right wrist: She exhibits decreased range of motion, tenderness and swelling. She exhibits no deformity.       Left hand: She exhibits decreased range of motion, tenderness and swelling.  Dorsum of hand with mild edema, erythema, and increased warmth.  Neurological: She is alert and oriented to person, place, and time.  Skin: Skin is warm and dry.  Psychiatric: She has a normal mood and affect.  Nursing note and vitals reviewed.        ED Treatments / Results  Labs (all labs ordered are listed, but only abnormal results are displayed) Labs Reviewed  CBC WITH DIFFERENTIAL/PLATELET - Abnormal; Notable for the following:       Result Value   RDW 15.8 (*)    All other components within normal limits  BASIC METABOLIC PANEL - Abnormal; Notable for the following:    Sodium 132 (*)    Potassium 3.1 (*)    Chloride 92 (*)    Glucose, Bld 241 (*)    BUN 72 (*)    Creatinine, Ser 1.57 (*)    GFR calc non Af Amer 34 (*)    GFR calc Af Amer 39 (*)    All other components within  normal limits    EKG  EKG Interpretation None       Radiology Dg Wrist Complete Right  Result Date: 03/18/2017 CLINICAL DATA:  Status post fall, with right wrist pain. Initial encounter. EXAM: RIGHT WRIST - COMPLETE 3+ VIEW COMPARISON:  Right hand radiographs performed 03/17/2017 FINDINGS: There is no evidence of fracture or dislocation. The carpal rows are intact, and demonstrate normal alignment. The joint spaces are preserved. Negative ulnar variance is noted. Soft tissue swelling is noted about the wrist. IMPRESSION: No evidence of fracture or dislocation. Electronically Signed   By: Roanna Raider M.D.   On: 03/18/2017 02:13   Dg Hand Complete Right  Result Date: 03/17/2017 CLINICAL DATA:  66 y/o  F; right hand pain after fall. EXAM: RIGHT HAND - COMPLETE 3+ VIEW COMPARISON:  None. FINDINGS: There is no evidence of fracture or dislocation. There is no evidence of arthropathy or other focal bone abnormality. Soft tissues are unremarkable. IMPRESSION: Negative. Electronically Signed   By: Mitzi Hansen M.D.   On: 03/17/2017 22:24    Procedures Procedures (including critical care time)  Medications Ordered in ED Medications  vancomycin (VANCOCIN) IVPB 1000 mg/200 mL premix (1,000 mg Intravenous New Bag/Given 03/18/17 0226)  piperacillin-tazobactam (ZOSYN) IVPB 3.375 g (not administered)  acetaminophen (TYLENOL) tablet 650 mg (650 mg Oral Given 03/18/17 0116)     Initial Impression / Assessment and Plan / ED Course  I have reviewed the triage vital signs and the nursing notes.  Pertinent labs & imaging results that were available during my care of the patient were reviewed by me and considered in my medical decision making (see chart for details).    Patient discussed with and seen by Dr. Manus Gunning.  Anticipate admission for IV antibiotics and hand consult.  Tetanus up-to-date.  Admission to hospitalist Katrinka Blazing).   Final Clinical Impressions(s) / ED Diagnoses   Final  diagnoses:  Cellulitis of hand    New Prescriptions New Prescriptions   No medications  on file     Felicie Morn, NP 03/18/17 [id number]    Glynn Octave, MD 03/18/17 [phone]

## 2017-03-18 NOTE — Progress Notes (Signed)
Pt placed on cardiac monitoring as ordered

## 2017-03-19 LAB — BASIC METABOLIC PANEL
Anion gap: 13 (ref 5–15)
BUN: 69 mg/dL — AB (ref 6–20)
CALCIUM: 8.7 mg/dL — AB (ref 8.9–10.3)
CO2: 26 mmol/L (ref 22–32)
CREATININE: 1.66 mg/dL — AB (ref 0.44–1.00)
Chloride: 94 mmol/L — ABNORMAL LOW (ref 101–111)
GFR calc Af Amer: 36 mL/min — ABNORMAL LOW (ref >60)
GFR, EST NON AFRICAN AMERICAN: 31 mL/min — AB (ref >60)
Glucose, Bld: 112 mg/dL — ABNORMAL HIGH (ref 65–99)
POTASSIUM: 3.9 mmol/L (ref 3.5–5.1)
SODIUM: 133 mmol/L — AB (ref 135–145)

## 2017-03-19 LAB — CBC
HCT: 40.3 % (ref 36.0–46.0)
Hemoglobin: 13.3 g/dL (ref 12.0–15.0)
MCH: 28.9 pg (ref 26.0–34.0)
MCHC: 33 g/dL (ref 30.0–36.0)
MCV: 87.6 fL (ref 78.0–100.0)
PLATELETS: 281 10*3/uL (ref 150–400)
RBC: 4.6 MIL/uL (ref 3.87–5.11)
RDW: 16.4 % — AB (ref 11.5–15.5)
WBC: 4.2 10*3/uL (ref 4.0–10.5)

## 2017-03-19 LAB — GLUCOSE, CAPILLARY
GLUCOSE-CAPILLARY: 114 mg/dL — AB (ref 65–99)
Glucose-Capillary: 191 mg/dL — ABNORMAL HIGH (ref 65–99)
Glucose-Capillary: 165 mg/dL — ABNORMAL HIGH (ref 65–99)
Glucose-Capillary: 164 mg/dL — ABNORMAL HIGH (ref 65–99)

## 2017-03-19 NOTE — Progress Notes (Signed)
PROGRESS NOTE        PATIENT DETAILS Name: Christine Knight Age: 66 y.o. Sex: female Date of Birth: [birth year] Admit Date: 03/18/2017 Admitting Physician Clydie Braun, MD ZOX:[id number], Suszanne Conners, PA-C  Brief Narrative: Patient is a 66 y.o. female chronic systolic heart failure, CAD status post CABG in 2012, morbid obesity, chronic kidney disease stage III admitted with right hand swelling-following a mechanical fall this past weekend (fell on her outstretched right hand). See below for further details.  Subjective: Per patient-Right hand swelling has improved today  No chest pain No SOB No headache No Nausea vomiting or diarrhea   Assessment/Plan: Right hand swelling/pain probably secondary to soft tissue infection: No evidence of abscess on my exam-continue empiric vancomycin and Zosyn. X-ray of right wrist/hand negative for fractures. She does have some abrasions both on palmar and dorsal aspect of the hand from her recent fall this past weekend.. Her documentation in Epic-last tetanus shot was in 2015. Have consulted hand surgery for further evaluation-Dr. Merlyn Lot evaluated and found no surgery. Continue medical management. IV antibiotics for 1 more day then d/c on oral antibiotics. No allergies to antibiotics reported.  Chronic biventricular heart failure (EF 15-20% per echo on 04/14/16): Has mild lower extremity edema-continue Demadex. Weight on most recent discharge 200 pounds-weight today 202 pounds. Follow closely, if weight continues to increase, may need to adjust diuretic regimen. Will add telemetry monitoring.  Severe pulmonary hypertension: Chronic issues-seen on last echo as well. Likely secondary to severe cardiomyopathy.  Chronic kidney disease stage III: Creatinine close to usual baseline, follow.  Hypothyroidism: Recently started on levothyroxine, follow TSH in 3 months.  Hypokalemia: Increased schedule Lasix to twice a day-recheck chemistry  panel tomorrow morning.  Mild hyponatremia: Suspect secondary to CHF, follow. Maintain fluid restriction  CAD status post CABG in 2012: No anginal symptoms-Continue aspirin, statin  History of hepatic encephalopathy: Continue rifaximin and lactulose. Awake and alert this morning.  Echo 04/14/16 (reviewed): EF 15-20%  Old records review: prior d/c summary done on 5/3   DVT Prophylaxis: Prophylactic Lovenox  Code Status: Full code   Family Communication: None at bedside  Disposition Plan: D/c next am on oral antibiotics for a more prolonged course (10 days)   Antimicrobial agents: Anti-infectives    Start     Dose/Rate Route Frequency Ordered Stop   03/19/17 0000  vancomycin (VANCOCIN) 1,500 mg in sodium chloride 0.9 % 500 mL IVPB     1,500 mg 250 mL/hr over 120 Minutes Intravenous Every 24 hours 03/18/17 0525     03/18/17 1400  piperacillin-tazobactam (ZOSYN) IVPB 3.375 g     3.375 g 12.5 mL/hr over 240 Minutes Intravenous Every 8 hours 03/18/17 0525     03/18/17 1000  rifaximin (XIFAXAN) tablet 550 mg     550 mg Oral 2 times daily 03/18/17 0516     03/18/17 0315  piperacillin-tazobactam (ZOSYN) IVPB 3.375 g     3.375 g 100 mL/hr over 30 Minutes Intravenous  Once 03/18/17 0306 03/18/17 0502   03/18/17 0200  vancomycin (VANCOCIN) IVPB 1000 mg/200 mL premix     1,000 mg 200 mL/hr over 60 Minutes Intravenous  Once 03/18/17 0200 03/18/17 0428      Procedures: None  CONSULTS:  orthopedic surgery  Time spent: 25 minutes-Greater than 50% of this time was spent in counseling, explanation of diagnosis, planning  of further management, and coordination of care.  MEDICATIONS: Scheduled Meds: . aspirin EC  81 mg Oral Daily  . atorvastatin  40 mg Oral Daily  . enoxaparin (LOVENOX) injection  40 mg Subcutaneous Q24H  . insulin aspart  0-5 Units Subcutaneous QHS  . insulin aspart  0-9 Units Subcutaneous TID WC  . lactulose  30 g Oral BID  . levothyroxine  50 mcg Oral QAC  breakfast  . potassium chloride SA  20 mEq Oral BID  . rifaximin  550 mg Oral BID  . spironolactone  25 mg Oral Daily  . torsemide  80 mg Oral BID   Continuous Infusions: . piperacillin-tazobactam (ZOSYN)  IV 3.375 g (03/19/17 1325)  . vancomycin Stopped (03/19/17 0158)   PRN Meds:.acetaminophen, albuterol, HYDROcodone-acetaminophen, ondansetron **OR** ondansetron (ZOFRAN) IV   PHYSICAL EXAM: Vital signs: Vitals:   03/18/17 1957 03/19/17 0510 03/19/17 0830 03/19/17 1535  BP: (!) 94/52 110/63  94/63  Pulse: (!) 57 68  (!) 53  Resp: 17 17  16   Temp: 97.7 F (36.5 C) 97.5 F (36.4 C)  98.1 F (36.7 C)  TempSrc: Oral Oral  Axillary  SpO2: 100% 100%  100%  Weight:   92.8 kg (204 lb 8 oz)   Height:       Filed Weights   03/18/17 0823 03/19/17 0830  Weight: 91.7 kg (202 lb 1.6 oz) 92.8 kg (204 lb 8 oz)   Body mass index is 33.01 kg/m.   General appearance :Awake, alert, not in any distress. Speech Clear.  Eyes:, pupils equally reactive to light and accomodation,no scleral icterus.Pink conjunctiva HEENT: Atraumatic and Normocephalic Neck: supple, no JVD. No cervical lymphadenopathy. No thyromegaly Resp:Good air entry bilaterally, no added sounds , no wheezes CVS: S1 S2 regular, no murmurs.  GI: Bowel sounds present, Non tender and not distended with no gaurding, rigidity or rebound.No organomegaly Extremities: B/L Lower Ext shows no edema, both legs are warm to touch Right hand: Swollen in both palmar and dorsal aspects-all 5 fingers also swollen. Has abrasions on the dorsum and palmar aspect. Tender passive flexion. No fluctuation. Neurology:  speech clear,Non focal, sensation is grossly intact. Psychiatric: Normal judgment and insight. Alert and oriented x 3. Normal mood. Musculoskeletal:No digital cyanosis Skin:No Rash, warm and dry Wounds:N/A  I have personally reviewed following labs and imaging studies  LABORATORY DATA: CBC:  Recent Labs Lab 03/18/17 0224  03/19/17 0533  WBC 6.1 4.2  NEUTROABS 4.3  --   HGB 13.4 13.3  HCT 40.7 40.3  MCV 86.8 87.6  PLT 313 281    Basic Metabolic Panel:  Recent Labs Lab 03/13/17 0514 03/18/17 0224 03/19/17 0533  NA 131* 132* 133*  K 3.3* 3.1* 3.9  CL 88* 92* 94*  CO2 34* 27 26  GLUCOSE 90 241* 112*  BUN 47* 72* 69*  CREATININE 1.55* 1.57* 1.66*  CALCIUM 8.9 9.3 8.7*    GFR: Estimated Creatinine Clearance: 38.8 mL/min (A) (by C-G formula based on SCr of 1.66 mg/dL (H)).  Liver Function Tests: No results for input(s): AST, ALT, ALKPHOS, BILITOT, PROT, ALBUMIN in the last 168 hours. No results for input(s): LIPASE, AMYLASE in the last 168 hours. No results for input(s): AMMONIA in the last 168 hours.  Coagulation Profile: No results for input(s): INR, PROTIME in the last 168 hours.  Cardiac Enzymes: No results for input(s): CKTOTAL, CKMB, CKMBINDEX, TROPONINI in the last 168 hours.  BNP (last 3 results) No results for input(s): PROBNP in the last 8760  hours.  HbA1C:  Recent Labs  03/18/17 0224  HGBA1C 7.6*    CBG:  Recent Labs Lab 03/18/17 1206 03/18/17 1640 03/18/17 2124 03/19/17 0646 03/19/17 1121  GLUCAP 148* 132* 205* 114* 191*    Lipid Profile: No results for input(s): CHOL, HDL, LDLCALC, TRIG, CHOLHDL, LDLDIRECT in the last 72 hours.  Thyroid Function Tests: No results for input(s): TSH, T4TOTAL, FREET4, T3FREE, THYROIDAB in the last 72 hours.  Anemia Panel: No results for input(s): VITAMINB12, FOLATE, FERRITIN, TIBC, IRON, RETICCTPCT in the last 72 hours.  Urine analysis:    Component Value Date/Time   COLORURINE RED (A) 04/15/2016 1733   APPEARANCEUR CLOUDY (A) 04/15/2016 1733   LABSPEC 1.009 04/15/2016 1733   PHURINE 7.0 04/15/2016 1733   GLUCOSEU NEGATIVE 04/15/2016 1733   GLUCOSEU NEGATIVE 10/10/2014 1649   HGBUR LARGE (A) 04/15/2016 1733   BILIRUBINUR NEGATIVE 04/15/2016 1733   KETONESUR NEGATIVE 04/15/2016 1733   PROTEINUR NEGATIVE 04/15/2016  1733   UROBILINOGEN 1.0 05/29/2015 1414   NITRITE NEGATIVE 04/15/2016 1733   LEUKOCYTESUR SMALL (A) 04/15/2016 1733    Sepsis Labs: Lactic Acid, Venous No results found for: LATICACIDVEN  MICROBIOLOGY: No results found for this or any previous visit (from the past 240 hour(s)).  RADIOLOGY STUDIES/RESULTS: Dg Chest 2 View  Result Date: 03/04/2017 CLINICAL DATA:  Acute on chronic systolic CHF, swelling in abdomen and lower extremities, history CHF, hypertension, diabetes mellitus, coronary artery disease post MI, ischemic cardiomyopathy EXAM: CHEST  2 VIEW COMPARISON:  01/03/2017 FINDINGS: Enlargement of cardiac silhouette post CABG. Pulmonary vascular congestion. Atherosclerotic calcification aorta. Mediastinal contours normal. Mild perihilar edema consistent with CHF, little changed. Small LEFT pleural effusion. No pneumothorax. Bones unremarkable. IMPRESSION: Persistent mild CHF. Electronically Signed   By: Ulyses Southward M.D.   On: 03/04/2017 12:15   Dg Wrist Complete Right  Result Date: 03/18/2017 CLINICAL DATA:  Status post fall, with right wrist pain. Initial encounter. EXAM: RIGHT WRIST - COMPLETE 3+ VIEW COMPARISON:  Right hand radiographs performed 03/17/2017 FINDINGS: There is no evidence of fracture or dislocation. The carpal rows are intact, and demonstrate normal alignment. The joint spaces are preserved. Negative ulnar variance is noted. Soft tissue swelling is noted about the wrist. IMPRESSION: No evidence of fracture or dislocation. Electronically Signed   By: Roanna Raider M.D.   On: 03/18/2017 02:13   Dg Chest Port 1 View  Result Date: 03/04/2017 CLINICAL DATA:  66 year old female under evaluation for potential PICC placement. EXAM: PORTABLE CHEST 1 VIEW COMPARISON:  Chest x-ray 03/04/2017. FINDINGS: There is a left upper extremity PICC with tip terminating in the superior cavoatrial junction. Lung volumes are normal. No consolidative airspace disease. Small left pleural  effusion. There is cephalization of the pulmonary vasculature and slight indistinctness of the interstitial markings suggestive of mild pulmonary edema. Mild cardiomegaly. Aortic atherosclerosis. Status post median sternotomy for CABG. IMPRESSION: 1. Tip of left upper extremity PICC is at the superior cavoatrial junction. 2. The appearance the chest is compatible with mild congestive heart failure, with improvement compared to the prior study. 3. Small left pleural effusion is unchanged. 4. Aortic atherosclerosis. Electronically Signed   By: Trudie Reed M.D.   On: 03/04/2017 21:22   Dg Hand Complete Right  Result Date: 03/17/2017 CLINICAL DATA:  66 y/o  F; right hand pain after fall. EXAM: RIGHT HAND - COMPLETE 3+ VIEW COMPARISON:  None. FINDINGS: There is no evidence of fracture or dislocation. There is no evidence of arthropathy or other focal bone  abnormality. Soft tissues are unremarkable. IMPRESSION: Negative. Electronically Signed   By: Mitzi Hansen M.D.   On: 03/17/2017 22:24     LOS: 1 day   Penny Pia, MD  [facility] Pager:[phone]  If 7PM-7AM, please contact night-coverage [email or web address] Password [facility] 03/19/2017, 3:49 PM

## 2017-03-19 NOTE — Progress Notes (Signed)
Orthopedic Tech Progress Note Patient Details:  Christine Knight 28-Oct-1951 [id number]  Ortho Devices Type of Ortho Device: Velcro wrist splint Ortho Device/Splint Location: rue Ortho Device/Splint Interventions: Application   Baldemar Dady 03/19/2017, 2:25 PM

## 2017-03-20 ENCOUNTER — Other Ambulatory Visit (HOSPITAL_COMMUNITY): Payer: Self-pay | Admitting: *Deleted

## 2017-03-20 ENCOUNTER — Inpatient Hospital Stay (HOSPITAL_COMMUNITY): Admit: 2017-03-20 | Payer: Medicare Other

## 2017-03-20 DIAGNOSIS — I5032 Chronic diastolic (congestive) heart failure: Secondary | ICD-10-CM

## 2017-03-20 LAB — GLUCOSE, CAPILLARY: GLUCOSE-CAPILLARY: 125 mg/dL — AB (ref 65–99)

## 2017-03-20 MED ORDER — DOXYCYCLINE HYCLATE 100 MG PO TABS
100.0000 mg | ORAL_TABLET | Freq: Two times a day (BID) | ORAL | 0 refills | Status: AC
Start: 2017-03-20 — End: 2017-03-25

## 2017-03-20 MED ORDER — CEPHALEXIN 500 MG PO CAPS
500.0000 mg | ORAL_CAPSULE | Freq: Two times a day (BID) | ORAL | 0 refills | Status: AC
Start: 2017-03-20 — End: 2017-03-25

## 2017-03-20 MED FILL — DOXYCYCLINE HYCLATE 100 MG: 100 | 5 days supply | Qty: 10 | Fill #0

## 2017-03-20 NOTE — Discharge Summary (Addendum)
Physician Discharge Summary  Christine Knight RUE:[id number] DOB: 1951/09/10 DOA: 03/18/2017  PCP: Coralee Rud, PA-C  Admit date: 03/18/2017 Discharge date: 03/20/2017  Time spent: > 35 minutes  Recommendations for Outpatient Follow-up:  1. Ensure patient completes antibiotic course   Discharge Diagnoses:  Principal Problem:   Cellulitis of right hand Active Problems:   Type 2 diabetes, uncontrolled, with neuropathy (HCC)   Chronic systolic heart failure (HCC)   Hypokalemia   CKD (chronic kidney disease) stage 3, GFR 30-59 ml/min   Hepatic encephalopathy (HCC)   Discharge Condition: stable  Diet recommendation: Heart healthy diet  Filed Weights   03/18/17 0823 03/19/17 0830 03/20/17 0830  Weight: 91.7 kg (202 lb 1.6 oz) 92.8 kg (204 lb 8 oz) 94.3 kg (208 lb)    History of present illness:   66 y.o. female with medical history significant of combined systolic and diastolic CHF(last EF 15-20% with grade 2 dFx in 04/2016), ICM, CAD with H/O NSTEMI s/p CABG, PAH, and DM type 2; who presents with complaints of right hand swelling. Symptoms started after she fell 3 days ago, landing on her right hand  Hospital Course:  Right hand sprain with abrasions/ addendum: cellulitis of right hand - improved with rest and IV antibiotics - will d/c patient on oral antibiotics, 5 more days of doxycycline and keflex. - pain improved - May take acetaminophen for discomfort. Last aLT reported at 25  For other medical conditions listed above continue prior to admission medication regimen listed below.  Procedures:  none  Consultations:  Orthopaedic surgeon  Discharge Exam: Vitals:   03/19/17 2011 03/20/17 0556  BP: (!) 93/55 98/60  Pulse: 74 62  Resp: 16 16  Temp: 97.4 F (36.3 C) 97.3 F (36.3 C)    General: Pt in nad, alert and awake Cardiovascular: rrr, no rubs Respiratory: no increased wob, no wheezes  Discharge Instructions   Discharge Instructions    Call MD for:   severe uncontrolled pain    Complete by:  As directed    Call MD for:  temperature >100.4    Complete by:  As directed    Diet - low sodium heart healthy    Complete by:  As directed    Discharge instructions    Complete by:  As directed    Please be sure to follow up with your primary care physician for further evaluation and recommendations.   Increase activity slowly    Complete by:  As directed      Current Discharge Medication List    START taking these medications   Details  cephALEXin (KEFLEX) 500 MG capsule Take 1 capsule (500 mg total) by mouth 2 (two) times daily. Qty: 10 capsule, Refills: 0    doxycycline (VIBRA-TABS) 100 MG tablet Take 1 tablet (100 mg total) by mouth 2 (two) times daily. Qty: 10 tablet, Refills: 0      CONTINUE these medications which have NOT CHANGED   Details  acetaminophen (TYLENOL) 650 MG CR tablet Take 650 mg by mouth every 8 (eight) hours as needed (for arthritic pain).     aspirin 81 MG tablet Take 1 tablet (81 mg total) by mouth daily. Qty: 30 tablet, Refills: 9    atorvastatin (LIPITOR) 40 MG tablet Take 1 tablet (40 mg total) by mouth daily. Qty: 90 tablet, Refills: 3    glipiZIDE (GLUCOTROL) 5 MG tablet Take 1 tablet (5 mg total) by mouth 2 (two) times daily with a meal. Qty: 60 tablet, Refills:  6    lactulose (CHRONULAC) 10 GM/15ML solution Take 45 mLs (30 g total) by mouth 2 (two) times daily. Qty: 2700 mL, Refills: 6    levothyroxine (SYNTHROID, LEVOTHROID) 50 MCG tablet Take 1 tablet (50 mcg total) by mouth daily before breakfast. Qty: 30 tablet, Refills: 6    Multiple Vitamins-Minerals (CENTRUM SILVER 50+WOMEN PO) Take 1 tablet by mouth every morning.    potassium chloride SA (K-DUR,KLOR-CON) 20 MEQ tablet Take 1 tablet (20 mEq total) by mouth daily. Qty: 30 tablet, Refills: 6    rifaximin (XIFAXAN) 550 MG TABS tablet Take 1 tablet (550 mg total) by mouth 2 (two) times daily. Qty: 60 tablet, Refills: 12    spironolactone  (ALDACTONE) 25 MG tablet Take 1 tablet (25 mg total) by mouth daily. Qty: 30 tablet, Refills: 5    torsemide (DEMADEX) 20 MG tablet Take 4 tablets (80 mg total) by mouth 2 (two) times daily. Qty: 240 tablet, Refills: 12       Allergies  Allergen Reactions  . Lisinopril Nausea And Vomiting, Other (See Comments) and Cough    Coughing, throwing up, waking up coughing  . Metformin And Related Anaphylaxis   Follow-up Information    Coralee Rud, PA-C.   Specialty:  Cardiology Contact information: [facility]  [address] [place] [address] [phone]            The results of significant diagnostics from this hospitalization (including imaging, microbiology, ancillary and laboratory) are listed below for reference.    Significant Diagnostic Studies: Dg Chest 2 View  Result Date: 03/04/2017 CLINICAL DATA:  Acute on chronic systolic CHF, swelling in abdomen and lower extremities, history CHF, hypertension, diabetes mellitus, coronary artery disease post MI, ischemic cardiomyopathy EXAM: CHEST  2 VIEW COMPARISON:  01/03/2017 FINDINGS: Enlargement of cardiac silhouette post CABG. Pulmonary vascular congestion. Atherosclerotic calcification aorta. Mediastinal contours normal. Mild perihilar edema consistent with CHF, little changed. Small LEFT pleural effusion. No pneumothorax. Bones unremarkable. IMPRESSION: Persistent mild CHF. Electronically Signed   By: Ulyses Southward M.D.   On: 03/04/2017 12:15   Dg Wrist Complete Right  Result Date: 03/18/2017 CLINICAL DATA:  Status post fall, with right wrist pain. Initial encounter. EXAM: RIGHT WRIST - COMPLETE 3+ VIEW COMPARISON:  Right hand radiographs performed 03/17/2017 FINDINGS: There is no evidence of fracture or dislocation. The carpal rows are intact, and demonstrate normal alignment. The joint spaces are preserved. Negative ulnar variance is noted. Soft tissue swelling is noted about the wrist. IMPRESSION: No  evidence of fracture or dislocation. Electronically Signed   By: Roanna Raider M.D.   On: 03/18/2017 02:13   Dg Chest Port 1 View  Result Date: 03/04/2017 CLINICAL DATA:  66 year old female under evaluation for potential PICC placement. EXAM: PORTABLE CHEST 1 VIEW COMPARISON:  Chest x-ray 03/04/2017. FINDINGS: There is a left upper extremity PICC with tip terminating in the superior cavoatrial junction. Lung volumes are normal. No consolidative airspace disease. Small left pleural effusion. There is cephalization of the pulmonary vasculature and slight indistinctness of the interstitial markings suggestive of mild pulmonary edema. Mild cardiomegaly. Aortic atherosclerosis. Status post median sternotomy for CABG. IMPRESSION: 1. Tip of left upper extremity PICC is at the superior cavoatrial junction. 2. The appearance the chest is compatible with mild congestive heart failure, with improvement compared to the prior study. 3. Small left pleural effusion is unchanged. 4. Aortic atherosclerosis. Electronically Signed   By: Trudie Reed M.D.   On: 03/04/2017 21:22  Dg Hand Complete Right  Result Date: 03/17/2017 CLINICAL DATA:  66 y/o  F; right hand pain after fall. EXAM: RIGHT HAND - COMPLETE 3+ VIEW COMPARISON:  None. FINDINGS: There is no evidence of fracture or dislocation. There is no evidence of arthropathy or other focal bone abnormality. Soft tissues are unremarkable. IMPRESSION: Negative. Electronically Signed   By: Mitzi HansenLance  Furusawa-Stratton M.D.   On: 03/17/2017 22:24    Microbiology: No results found for this or any previous visit (from the past 240 hour(s)).   Labs: Basic Metabolic Panel:  Recent Labs Lab 03/18/17 0224 03/19/17 0533  NA 132* 133*  K 3.1* 3.9  CL 92* 94*  CO2 27 26  GLUCOSE 241* 112*  BUN 72* 69*  CREATININE 1.57* 1.66*  CALCIUM 9.3 8.7*   Liver Function Tests: No results for input(s): AST, ALT, ALKPHOS, BILITOT, PROT, ALBUMIN in the last 168 hours. No  results for input(s): LIPASE, AMYLASE in the last 168 hours. No results for input(s): AMMONIA in the last 168 hours. CBC:  Recent Labs Lab 03/18/17 0224 03/19/17 0533  WBC 6.1 4.2  NEUTROABS 4.3  --   HGB 13.4 13.3  HCT 40.7 40.3  MCV 86.8 87.6  PLT 313 281   Cardiac Enzymes: No results for input(s): CKTOTAL, CKMB, CKMBINDEX, TROPONINI in the last 168 hours. BNP: BNP (last 3 results)  Recent Labs  12/06/16 1008 01/02/17 1507 03/04/17 1013  BNP 854.6* 827.6* 1,209.6*    ProBNP (last 3 results) No results for input(s): PROBNP in the last 8760 hours.  CBG:  Recent Labs Lab 03/19/17 0646 03/19/17 1121 03/19/17 1642 03/19/17 2118 03/20/17 0613  GLUCAP 114* 191* 165* 164* 125*    Signed:  Penny PiaVEGA, Arzu Mcgaughey MD.  [facility] 03/20/2017, 10:08 AM

## 2017-03-20 NOTE — Progress Notes (Signed)
Patient has taken off all telemetry leads and equipment. She is refusing telemetry in anticipation of leaving this morning. CCMD has placed her on stand-by until the order can be dc'd.

## 2017-03-20 NOTE — Progress Notes (Signed)
Discharge instructions and prescription provided to patient.  IV removed.  No questions at discharge.  Daughter present to take patient home.

## 2017-03-20 NOTE — Progress Notes (Signed)
Lab is here to do am Bmet patient refused to allow lab to get blood work.  As per patient " I am tired of ya'll sticking me. No! And take this iv out, I'm done with that too!"  Explained to patient the need to re-check Bmet this morning.  She continued to refuse.

## 2017-03-21 ENCOUNTER — Other Ambulatory Visit (HOSPITAL_COMMUNITY): Payer: Self-pay

## 2017-03-21 NOTE — Progress Notes (Signed)
Paramedicine Encounter    Patient ID: Lum BabeBarbara Un, female    DOB: February 21, 1951, 66 y.o.   MRN: [id number]    Patient Care Team: Ronnald Collumuran, [name] R, PA-C as PCP - General (Cardiology) Luis AbedKatz, Jeffrey D, MD (Cardiology)  Patient Active Problem List   Diagnosis Date Noted  . Cellulitis of right hand 03/18/2017  . Acute on chronic systolic CHF (congestive heart failure) (HCC) 03/04/2017  . OSA (obstructive sleep apnea) 02/25/2017  . RVF (right ventricular failure) (HCC)   . Hepatic encephalopathy (HCC) 01/03/2017  . Systolic CHF, acute on chronic (HCC) 01/02/2017  . Acute on chronic systolic ACC/AHA stage C congestive heart failure (HCC) 12/06/2016  . CAD (coronary artery disease) 12/06/2016  . Suspected sleep apnea 11/21/2016  . Acute on chronic diastolic CHF (congestive heart failure) (HCC) 08/26/2016  . CHF (congestive heart failure) (HCC) 08/26/2016  . CKD (chronic kidney disease) stage 3, GFR 30-59 ml/min 05/02/2016  . Hypokalemia   . Chest tightness   . Mass of lower lobe of left lung 07/25/2015  . Primary gout 07/25/2015  . NSTEMI- declined cath 05/29/2015  . DJD (degenerative joint disease), multiple sites 11/07/2014  . Painful diabetic neuropathy (HCC) 10/10/2014  . Hyperkalemia 06/17/2013  . Chronic systolic heart failure (HCC) 04/02/2013  . Hypothyroidism 03/18/2013  . Pulmonary hypertension (HCC)   . Hx of CABG 2012 08/30/2011  . Obesity, morbid-BMI 45   . Type 2 diabetes, uncontrolled, with neuropathy (HCC)   . Ischemic cardiomyopathy-30-35% May 2016 08/13/2011  . Hyperlipidemia LDL goal <70 08/10/2011    Current Outpatient Prescriptions:  .  aspirin 81 MG tablet, Take 1 tablet (81 mg total) by mouth daily., Disp: 30 tablet, Rfl: 9 .  atorvastatin (LIPITOR) 40 MG tablet, Take 1 tablet (40 mg total) by mouth daily., Disp: 90 tablet, Rfl: 3 .  glipiZIDE (GLUCOTROL) 5 MG tablet, Take 1 tablet (5 mg total) by mouth 2 (two) times daily with a meal., Disp: 60 tablet,  Rfl: 6 .  levothyroxine (SYNTHROID, LEVOTHROID) 50 MCG tablet, Take 1 tablet (50 mcg total) by mouth daily before breakfast., Disp: 30 tablet, Rfl: 6 .  Multiple Vitamins-Minerals (CENTRUM SILVER 50+WOMEN PO), Take 1 tablet by mouth every morning., Disp: , Rfl:  .  potassium chloride SA (K-DUR,KLOR-CON) 20 MEQ tablet, Take 1 tablet (20 mEq total) by mouth daily., Disp: 30 tablet, Rfl: 6 .  rifaximin (XIFAXAN) 550 MG TABS tablet, Take 1 tablet (550 mg total) by mouth 2 (two) times daily., Disp: 60 tablet, Rfl: 12 .  spironolactone (ALDACTONE) 25 MG tablet, Take 1 tablet (25 mg total) by mouth daily., Disp: 30 tablet, Rfl: 5 .  torsemide (DEMADEX) 20 MG tablet, Take 4 tablets (80 mg total) by mouth 2 (two) times daily., Disp: 240 tablet, Rfl: 12 .  acetaminophen (TYLENOL) 650 MG CR tablet, Take 650 mg by mouth every 8 (eight) hours as needed (for arthritic pain). , Disp: , Rfl:  .  cephALEXin (KEFLEX) 500 MG capsule, Take 1 capsule (500 mg total) by mouth 2 (two) times daily., Disp: 10 capsule, Rfl: 0 .  doxycycline (VIBRA-TABS) 100 MG tablet, Take 1 tablet (100 mg total) by mouth 2 (two) times daily., Disp: 10 tablet, Rfl: 0 .  lactulose (CHRONULAC) 10 GM/15ML solution, Take 45 mLs (30 g total) by mouth 2 (two) times daily., Disp: 2700 mL, Rfl: 6 Allergies  Allergen Reactions  . Lisinopril Nausea And Vomiting, Other (See Comments) and Cough    Coughing, throwing up, waking up coughing  .  Metformin And Related Anaphylaxis     Social History   Social History  . Marital status: Divorced    Spouse name: N/A  . Number of children: 3  . 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 Smoking status: Former Smoker    Types: Cigarettes    Quit date: 11/11/1978  . Smokeless tobacco: Never Used     Comment: 03/17/2013 "only a social smoker when I did smoke; ever bought any"  . Alcohol use Yes     Comment: OCCASIONAL  . Drug use: No  . Sexual  activity: Not Currently    Birth control/ protection: Post-menopausal   Other Topics Concern  . Not on file   Social History Narrative   Lives with her sister and daughter.  Ambulates independently.    Physical Exam  Pulmonary/Chest: No respiratory distress. She has no wheezes. She has no rales.  Abdominal: She exhibits no distension. There is no tenderness. There is no guarding.  Musculoskeletal: She exhibits edema.  Skin: Skin is warm and dry. She is not diaphoretic.        No future appointments.  ATF pt CAO x4 home from hospital stay due right hand injury.  Pt stated that she was upset because she was given a lot of iv fluids in the hospital.  Pt stated that they gave her meds at different times of the day and she was very frustrated.  Pt hasn't taken her meds this am; she was in the kitchen about to cook breakfast. Pt stated that her home care nurse had just left.  Pt denies sob, dizziness, headache and chest pain. Pt stated that she is confident in that she is back on track with her diet and fluid intake.  Pt has been working around her house and staying busy.  Pts daughter is supposed to pick up the two medications that were prescribed to her yesterday.  I advised her the importance of taking these meds.  rx bottles verified and pill box filled.   **pt needs keflex, doxycycline  BP 120/72 (BP Location: Left Arm, Patient Position: Sitting, Cuff Size: Normal)   Pulse 61   Resp 16   Wt 205 lb (93 kg)   SpO2 96%   BMI 33.09 kg/m  cbg 187 Weight yesterday-208lb Last visit weight-202    Tahj Njoku, EMT Paramedic 03/21/2017    ACTION: Home visit completed Next visit planned for next thursday

## 2017-03-28 ENCOUNTER — Other Ambulatory Visit (HOSPITAL_COMMUNITY): Payer: Self-pay

## 2017-03-28 NOTE — Progress Notes (Signed)
Paramedicine Encounter    Patient ID: Christine Knight, female    DOB: 05/31/1951, 66 y.o.   MRN: [id number]    Patient Care Team: Ronnald Collumuran, [name] R, PA-C as PCP - General (Cardiology) Luis AbedKatz, Jeffrey D, MD (Cardiology)  Patient Active Problem List   Diagnosis Date Noted  . Cellulitis of right hand 03/18/2017  . Acute on chronic systolic CHF (congestive heart failure) (HCC) 03/04/2017  . OSA (obstructive sleep apnea) 02/25/2017  . RVF (right ventricular failure) (HCC)   . Hepatic encephalopathy (HCC) 01/03/2017  . Systolic CHF, acute on chronic (HCC) 01/02/2017  . Acute on chronic systolic ACC/AHA stage C congestive heart failure (HCC) 12/06/2016  . CAD (coronary artery disease) 12/06/2016  . Suspected sleep apnea 11/21/2016  . Acute on chronic diastolic CHF (congestive heart failure) (HCC) 08/26/2016  . CHF (congestive heart failure) (HCC) 08/26/2016  . CKD (chronic kidney disease) stage 3, GFR 30-59 ml/min 05/02/2016  . Hypokalemia   . Chest tightness   . Mass of lower lobe of left lung 07/25/2015  . Primary gout 07/25/2015  . NSTEMI- declined cath 05/29/2015  . DJD (degenerative joint disease), multiple sites 11/07/2014  . Painful diabetic neuropathy (HCC) 10/10/2014  . Hyperkalemia 06/17/2013  . Chronic systolic heart failure (HCC) 04/02/2013  . Hypothyroidism 03/18/2013  . Pulmonary hypertension (HCC)   . Hx of CABG 2012 08/30/2011  . Obesity, morbid-BMI 45   . Type 2 diabetes, uncontrolled, with neuropathy (HCC)   . Ischemic cardiomyopathy-30-35% May 2016 08/13/2011  . Hyperlipidemia LDL goal <70 08/10/2011    Current Outpatient Prescriptions:  .  aspirin 81 MG tablet, Take 1 tablet (81 mg total) by mouth daily., Disp: 30 tablet, Rfl: 9 .  atorvastatin (LIPITOR) 40 MG tablet, Take 1 tablet (40 mg total) by mouth daily., Disp: 90 tablet, Rfl: 3 .  glipiZIDE (GLUCOTROL) 5 MG tablet, Take 1 tablet (5 mg total) by mouth 2 (two) times daily with a meal., Disp: 60 tablet,  Rfl: 6 .  lactulose (CHRONULAC) 10 GM/15ML solution, Take 45 mLs (30 g total) by mouth 2 (two) times daily., Disp: 2700 mL, Rfl: 6 .  levothyroxine (SYNTHROID, LEVOTHROID) 50 MCG tablet, Take 1 tablet (50 mcg total) by mouth daily before breakfast., Disp: 30 tablet, Rfl: 6 .  Multiple Vitamins-Minerals (CENTRUM SILVER 50+WOMEN PO), Take 1 tablet by mouth every morning., Disp: , Rfl:  .  potassium chloride SA (K-DUR,KLOR-CON) 20 MEQ tablet, Take 1 tablet (20 mEq total) by mouth daily., Disp: 30 tablet, Rfl: 6 .  rifaximin (XIFAXAN) 550 MG TABS tablet, Take 1 tablet (550 mg total) by mouth 2 (two) times daily., Disp: 60 tablet, Rfl: 12 .  spironolactone (ALDACTONE) 25 MG tablet, Take 1 tablet (25 mg total) by mouth daily., Disp: 30 tablet, Rfl: 5 .  torsemide (DEMADEX) 20 MG tablet, Take 4 tablets (80 mg total) by mouth 2 (two) times daily., Disp: 240 tablet, Rfl: 12 .  acetaminophen (TYLENOL) 650 MG CR tablet, Take 650 mg by mouth every 8 (eight) hours as needed (for arthritic pain). , Disp: , Rfl:  Allergies  Allergen Reactions  . Lisinopril Nausea And Vomiting, Other (See Comments) and Cough    Coughing, throwing up, waking up coughing  . Metformin And Related Anaphylaxis     Social History   Social History  . Marital status: Divorced    Spouse name: N/A  . Number of children: 3  . Years of education: N/A   Occupational History  . Bank teller  Social History Main Topics  . Smoking status: Former Smoker    Types: Cigarettes    Quit date: 11/11/1978  . Smokeless tobacco: Never Used     Comment: 03/17/2013 "only a social smoker when I did smoke; ever bought any"  . Alcohol use Yes     Comment: OCCASIONAL  . Drug use: No  . Sexual activity: Not Currently    Birth control/ protection: Post-menopausal   Other Topics Concern  . Not on file   Social History Narrative   Lives with her sister and daughter.  Ambulates independently.    Physical Exam  Eyes: Pupils are equal, round,  and reactive to light.  Pulmonary/Chest: No respiratory distress. She has no wheezes. She has no rales.  Abdominal: She exhibits no distension. There is no tenderness. There is no guarding.  Musculoskeletal: She exhibits edema.  Skin: Skin is warm and dry. She is not diaphoretic.        Future Appointments Date Time Provider Department Center  04/01/2017 10:30 AM [facility] PA/NP [facility] None    ATF pt CAO x4 standing in the kitchen cooking breakfast. Pts daughter is staying over most nights with her now.  Pt stated that she "hurt herself on Monday by lifting a heavy object".  Pt stated that she has been feeling bad every since.  Pt stated that she became sob during the incident and she had to sit down.  Pt is very active during the day with shopping with her daughter and organizing her house.  I advised pt to pace herself and when or if she becomes sob or dizzy for her to sit down. Pt stated that she has no other issues besides that.    Pt denies sob, dizziness, headache and chest pain today. She has edema in both legs +1. Pt took her medications about 20 mins before she ate her breakfast.  Pt's vitals noted.  Pt's bp is 90/64 with no ortho static changes.  The same happened two weeks ago after she took her morning meds. Pt stated that she usually sit at the kitchen table for a while after she takes her medications and she hasn't been dizzy afterwards.   Heart clinic called and notified of pt's b/p via voicemail.  Pt hasn't missed any medications this week. Rx verified and pill box refilled.  Nurse is still coming twice week.   BP 90/64 (BP Location: Left Arm, Patient Position: Sitting, Cuff Size: Normal)   Pulse (!) 56   Resp 16   Wt 204 lb (92.5 kg)   SpO2 98%   BMI 32.93 kg/m  cbg 97  ** rx called in:  Rifaximin( xifaxan)  Weight yesterday-205 Last visit weight- 205    Jaxsyn Azam, EMT Paramedic 03/28/2017  ACTION: Home visit completed Next visit planned for next  friday

## 2017-04-01 ENCOUNTER — Ambulatory Visit (HOSPITAL_COMMUNITY)
Admission: RE | Admit: 2017-04-01 | Discharge: 2017-04-01 | Disposition: A | Discharge status: Home / Self Care | Payer: Medicare Other | Source: Ambulatory Visit | Attending: Internal Medicine | Admitting: Internal Medicine

## 2017-04-01 ENCOUNTER — Other Ambulatory Visit (HOSPITAL_COMMUNITY): Payer: Self-pay

## 2017-04-01 VITALS — BP 132/68 | HR 65 | Wt 219.6 lb

## 2017-04-01 DIAGNOSIS — N183 Chronic kidney disease, stage 3 unspecified: Secondary | ICD-10-CM

## 2017-04-01 DIAGNOSIS — E162 Hypoglycemia, unspecified: Secondary | ICD-10-CM

## 2017-04-01 DIAGNOSIS — I252 Old myocardial infarction: Secondary | ICD-10-CM | POA: Insufficient documentation

## 2017-04-01 DIAGNOSIS — I5022 Chronic systolic (congestive) heart failure: Secondary | ICD-10-CM | POA: Diagnosis present

## 2017-04-01 DIAGNOSIS — I130 Hypertensive heart and chronic kidney disease with heart failure and stage 1 through stage 4 chronic kidney disease, or unspecified chronic kidney disease: Secondary | ICD-10-CM | POA: Insufficient documentation

## 2017-04-01 DIAGNOSIS — I272 Pulmonary hypertension, unspecified: Secondary | ICD-10-CM | POA: Diagnosis not present

## 2017-04-01 DIAGNOSIS — I5020 Unspecified systolic (congestive) heart failure: Secondary | ICD-10-CM | POA: Diagnosis not present

## 2017-04-01 DIAGNOSIS — I255 Ischemic cardiomyopathy: Secondary | ICD-10-CM

## 2017-04-01 DIAGNOSIS — E1122 Type 2 diabetes mellitus with diabetic chronic kidney disease: Secondary | ICD-10-CM | POA: Insufficient documentation

## 2017-04-01 DIAGNOSIS — Z951 Presence of aortocoronary bypass graft: Secondary | ICD-10-CM | POA: Diagnosis not present

## 2017-04-01 DIAGNOSIS — I2510 Atherosclerotic heart disease of native coronary artery without angina pectoris: Secondary | ICD-10-CM | POA: Insufficient documentation

## 2017-04-01 DIAGNOSIS — R29818 Other symptoms and signs involving the nervous system: Secondary | ICD-10-CM | POA: Diagnosis not present

## 2017-04-01 NOTE — Progress Notes (Signed)
Advanced Heart Failure Medication Review by a Pharmacist  Does the patient  feel that his/her medications are working for him/her?  yes  Has the patient been experiencing any side effects to the medications prescribed?  no  Does the patient measure his/her own blood pressure or blood glucose at home?  yes   Does the patient have any problems obtaining medications due to transportation or finances?   no  Understanding of regimen: fair Understanding of indications: fair Potential of compliance: good Patient understands to avoid NSAIDs. Patient understands to avoid decongestants.  Issues to address at subsequent visits: None   Pharmacist comments: Ms. Armanda HeritageMininall is a pleasant 66 yo F presenting with her granddaughter and her medication bottles. She reports good compliance with her regimen and is being seen by paramedicine on a weekly basis. The only concern she had was that her BG has been in the 50s lately and she has not been feeling well. No other medication-related questions or concerns for me at this time.   Tyler DeisErika K. Bonnye FavaNicolsen, PharmD, BCPS, CPP Clinical Pharmacist Pager: [phone] Phone: [phone] 04/01/2017 9:57 AM      Time with patient: 10 minutes Preparation and documentation time: 2 minutes Total time: 12 minutes

## 2017-04-01 NOTE — Progress Notes (Signed)
Patient ID: Tal Neer, female   DOB: 1951/02/20, 66 y.o.   MRN: [id number]     Advanced Heart Failure Clinic Note   PCP: Roque Cash  HF: Dr. Haroldine Laws   HPI: Ms.Morandi is a 66 y.o. female w/ PMHx significant for morbid obesity, systolic HF due to ICM (67/67 EF 25%; 07/2012 EF 35%; 03/18/13 EF 20-25%), CAD with h/o NSTEMI s/p CABG Oct 2012, pulmonary hypertension (PA peak 32mHg), and poorly controlled DM 2.  [facility], and weight gain.  Lasix gtt used and she diuresed 15 liters. Diuresed 29 pounds.  ECHO- EF 20-25%.  Discharge weight 264 pounds.    Admitted January, June, October, December 2017 with marked volume overload. Diuresed with high dose lasix.   Admitted February and April 2018 with marked volume overload. Most recent admit 4/24 through 03/13/17 with marked volume overload. Diuresed with lasix drip 30 mg per hour + metolazone + milrinone. Transitioned to torsemide 80 mg twice a day + spiro. Discharge weight was 200 pounds.   She returns for HF follow up.  Overall feeling ok. Says she tripped and fell May 8th -May 10th for R wrist cellulitis. Received IV fluid and antibiotic (208 pounds).  Denies SOB/PND/Orthoopnea. No CP. Does admit to increased dyspnea with steps. SOB with steps Weight at home up to 212 pounds. Appetite ok. Following low salt diet. Dinking extra fluid with low blood sugars. Glucose has been low at home in the 50-60s in the morning.  No CP. No fever or chills. Having trouble with transportation. Followed by Paramedicine.    Review of systems complete and found to be negative unless listed in HPI.    03/18/13 ECHO EF 20-25% 06/17/2013 ECHO EF 35% RV ok (read formally after visit 35-40%) 03/15/2015 ECHO EF 30-35%, grade 3 DD, Moderate TR, Severely reduced RV, PA peak pressure 81 mm Hg 12/09/2015 ECHO EF 15-20%, Decreased LV diastolic compliance, RV mod reduced, PA peak 57 mmHg, Severe TR  04/14/16 Echo 15-20%  10/2016: EF Peak  PA pressure 89 mm hg  EF 35-40% RV dysfunction.    [facility] RA 25/25 (23) RV pressure 92/13  RV EDP 26 PA pressure 86/34 (56) PW mean 37 AO Pressure 114/72 (88) LV Pressure 101/12 (26) LV EDP 26 PVR 3.24 Fick CO/CI 5.85 / 2.41L/min   Prox LAD lesion, 100% stenosed. Prox Cx lesion, 99% stenosed. Mid RCA lesion, 80% stenosed. Dist RCA lesion, 100% stenosed. Patent grafts    Past Medical History:  Diagnosis Date  . Arthritis   . CAD (coronary artery disease) 08/2011   NSTEMI with subsequent CABG 08/13/2011 with LIMA-LAD, SVG-diagonal, SVG-OM, SVG-PDA  . Carpal tunnel syndrome on both sides    "post OHS in 08/2011; resolved now" (03/17/2013)  . CHF (congestive heart failure) ([facility]   . Chronic systolic heart failure (HCC)    Chronic systolic CHF  . Diabetes mellitus type 2 in obese ([place] 08/10/11  . Dyspnea   . Ejection fraction < 50%    EF 20%, October, 2012  //  EF 25% December, 2012  //   EF 35%, echo, September, 2013  . Hyperlipidemia 08/10/11  . Hypertension   . Ischemic cardiomyopathy 08/13/2011   EF 20% in 08/2011, still 25% 10/21/2011  . Mitral regurgitation    Mild by echo 10/21/11  . [place]  . Ob[place], morbid [place]   . Orthopnoea    "progressively worse over last 3 wks" (03/17/2013)  .  Pleural effusion    Requiring L thoracentesis 09/02/11  . Pulmonary hypertension ([place])    Echo, September, 2013, 72 mmHg.    Current Outpatient Prescriptions  Medication Sig Dispense Refill  . acetaminophen (TYLENOL) 650 MG CR tablet Take 650 mg by mouth every 8 (eight) hours as needed (for arthritic pain).     Marland Kitchen aspirin 81 MG tablet Take 1 tablet (81 mg total) by mouth daily. 30 tablet 9  . atorvastatin (LIPITOR) 40 MG tablet Take 1 tablet (40 mg total) by mouth daily. 90 tablet 3  . glipiZIDE (GLUCOTROL) 5 MG tablet Take 1 tablet (5 mg total) by mouth 2 (two) times daily with a meal. 60 tablet 6  . lactulose (CHRONULAC) 10 GM/15ML  solution Take 45 mLs (30 g total) by mouth 2 (two) times daily. 2700 mL 6  . levothyroxine (SYNTHROID, LEVOTHROID) 50 MCG tablet Take 1 tablet (50 mcg total) by mouth daily before breakfast. 30 tablet 6  . Multiple Vitamins-Minerals (CENTRUM SILVER 50+WOMEN PO) Take 1 tablet by mouth every morning.    . potassium chloride SA (K-DUR,KLOR-CON) 20 MEQ tablet Take 1 tablet (20 mEq total) by mouth daily. 30 tablet 6  . rifaximin (XIFAXAN) 550 MG TABS tablet Take 1 tablet (550 mg total) by mouth 2 (two) times daily. 60 tablet 12  . spironolactone (ALDACTONE) 25 MG tablet Take 1 tablet (25 mg total) by mouth daily. 30 tablet 5  . torsemide (DEMADEX) 20 MG tablet Take 4 tablets (80 mg total) by mouth 2 (two) times daily. 240 tablet 12   No current facility-administered medications for this encounter.      PHYSICAL EXAM: Vitals:   04/01/17 0948  BP: 132/68  Pulse: 65  SpO2: 97%  Weight: 219 lb 9.6 oz (99.6 kg)   Wt Readings from Last 3 Encounters:  04/01/17 219 lb 9.6 oz (99.6 kg)  03/28/17 204 lb (92.5 kg)  03/21/17 205 lb (93 kg)    General:  Well appearing. No resp difficulty. Daughter present. Ambulated slowly in the HF clinic.  HEENT: normal Neck: supple. JVP to jaw  Carotids 2+ bilat; no bruits. No lymphadenopathy or thryomegaly appreciated. Cor: PMI nondisplaced. Regular rate & rhythm. No rubs, gallops or murmurs. Lungs: clear Abdomen: soft, nontender, nondistended. No hepatosplenomegaly. No bruits or masses. Good bowel sounds. Extremities: no cyanosis, clubbing, rash, R and LLE 2+ edema Neuro: alert & orientedx3, cranial nerves grossly intact. moves all 4 extremities w/o difficulty. Affect pleasant  ASSESSMENT/PLAN 1.A/C Chronic systolic HF due to ICM EF 15-20% Echo 04/14/2016 Volume status trending up despite Paramedicine and HH.  NYHA IIIb.  AHC will need to give 80 mg IV lasix. Would like to get weight down closer to 200-205 range. Continue current dose of torsemide and  potassium.    - Continue Spiro '25mg'$  daily. For now. Recent K elevated. Stopped K supp. May have to stop/cut back.  - No Ace/ARNi/Entresto with CKD.  - Intolerant bidil with difficulty with headaches, dizziness and hypotension.  - Reinforced fluid restriction to < 2 L daily, sodium restriction to less than 2000 mg daily, and the importance of daily weights.   2. CAD s/p NSTEMI and CABG 10/12  - No chest pain. No change to current plan.   - Continue atorvastatin 40 mg daily.  - Severe native CAD with patent grafts on [facility] 12/13/15. Continue ASA 81 mg.  3. Morbid obesity - Stressed importance of portion control, as well as fluid and sodium restriction. Needs to increase activity  as tolerated.  as   4. Pulmonary HTN - mild to mod RHC 12/13/15. PVR 3.24.  - No change to current plan.  5. CKD stage III-V - Recent Creatinine 1.66. Repeat BMET later this week.   - Advised her to avoid NSAIDs.  6. DM2  - Episodes of daily hypoglycemia. Stop glipizide. She will schedule follow up with PCP.  7. Obstructive sleep apnea: - had sleep study 12/25/16 and has severe obstructive sleep apnea. Will send message to Dr. Radford Pax for her office to set her up with a CPAP.  -Message sent to Dr Radford Pax for CPAP.  8. Gout - Per PCP 9. Hyperkalemia  -resolved  10. Advanced directives - Completed GOLD DNR form 01/2017.   - Will hold off on referring to Hospice now, but discussed with family that it is likely a possibility.   Follow up 7 days and 14 days. She also needs to return to PCP for hypoglycemia.   Today HF SW met with her for transportation issues. She is at high risk for readmit due to poor insight.  Greater than 50% of the (total minutes 25) visit spent in counseling/coordination of care regarding heart failure, low salt diet, limiting fluid intake, and follow up for glucose control with PCP.    Darrick Grinder, NP  9:53 AM

## 2017-04-01 NOTE — Patient Instructions (Signed)
STOP taking Glipizide  Follow up with PCP for Diabetes  Follow up in clinic in 1 week and again in 2 weeks.

## 2017-04-01 NOTE — Progress Notes (Signed)
CSW made joint visit with paramedicine. Patient reports continued challenges with her blood sugars which cause her to drink too much fluid and the ripple effect with her heart failure. Patient has had multiple hospitalizations due to BS issue. Patient states plans to make PCP visit to address BS and medications in hopes to better control BS. Paramedic shared some ideas to manage sugars at home without having to drink lots of OJ to bring BS up. CSW provided sodium free diet information for patient to review at home. CSW also discussed transportation options to assist with appointments as patient has to rely on family who all work making it difficult for patient to make appointments. Patient states she is not interested in [facility] as she does not like to use public transportation but was agreeable to follow up with [employer] through [employer]. Patient verbalizes understanding of follow up post clinic appointment today.  Patient will be seen by paramedicine in the home on Friday and follow up with a clinic appointment next week. CSW continues to be available as needed to coordinate care with patient and paramedicine. Lasandra BeechJackie Kashina Mecum, LCSW, CCSW-MCS [phone]

## 2017-04-01 NOTE — Progress Notes (Signed)
Paramedicine Encounter   Patient ID: Christine Knight , female,   DOB: 1951/11/11,65 y.o.,  MRN: [id number]   Met patient in clinic today with provider.  Time spent with patient 20 min   Met pt in clinic with jackie as dee was not able to make it, pt is here with one of her daughters.  she expresses concern about her CBG continuing to drop, she states when her CBG drops that she drinks a lot of fluids to increase her levels. Advised her to call her PCP, who is the [facility], to make appointment and her to tell her PCP about her low CBGs.  Per today she is to stop her glipizide and she will f/u with her PCP. Will let Karena Addison know that changes for today as they have sch home visit on Friday.   Marylouise Stacks, EMT-Paramedic 04/01/2017   ACTION: Home visit completed

## 2017-04-01 NOTE — Telephone Encounter (Signed)
What is this in regards to?

## 2017-04-03 NOTE — Telephone Encounter (Signed)
It is to let you know that I have made several phone call attempts including sending a certified letter to inform her of the sleep study results and patient has never responded.

## 2017-04-04 ENCOUNTER — Other Ambulatory Visit (HOSPITAL_COMMUNITY): Payer: Self-pay

## 2017-04-04 MED FILL — TORSEMIDE 20 MG TABLET: 20 | 30 days supply | Qty: 240 | Fill #2

## 2017-04-04 MED FILL — SPIRONOLACTONE 25 MG TABLET: 25 | 30 days supply | Qty: 30 | Fill #2

## 2017-04-04 MED FILL — LYRICA 100 MG CAPSULE: 100 | 30 days supply | Qty: 60 | Fill #1

## 2017-04-04 NOTE — Progress Notes (Signed)
Paramedicine Encounter    Patient ID: Christine Knight, female    DOB: Oct 05, 1951, 66 y.o.   MRN: [id number]    Patient Care Team: Ronnald Collumuran, [name] R, PA-C as PCP - General (Cardiology) Luis AbedKatz, Jeffrey D, MD (Cardiology)  Patient Active Problem List   Diagnosis Date Noted  . Cellulitis of right hand 03/18/2017  . Acute on chronic systolic CHF (congestive heart failure) (HCC) 03/04/2017  . OSA (obstructive sleep apnea) 02/25/2017  . RVF (right ventricular failure) (HCC)   . Hepatic encephalopathy (HCC) 01/03/2017  . Systolic CHF, acute on chronic (HCC) 01/02/2017  . Acute on chronic systolic ACC/AHA stage C congestive heart failure (HCC) 12/06/2016  . CAD (coronary artery disease) 12/06/2016  . Suspected sleep apnea 11/21/2016  . Acute on chronic diastolic CHF (congestive heart failure) (HCC) 08/26/2016  . CHF (congestive heart failure) (HCC) 08/26/2016  . CKD (chronic kidney disease) stage 3, GFR 30-59 ml/min 05/02/2016  . Hypokalemia   . Chest tightness   . Mass of lower lobe of left lung 07/25/2015  . Primary gout 07/25/2015  . NSTEMI- declined cath 05/29/2015  . DJD (degenerative joint disease), multiple sites 11/07/2014  . Painful diabetic neuropathy (HCC) 10/10/2014  . Hyperkalemia 06/17/2013  . Chronic systolic heart failure (HCC) 04/02/2013  . Hypothyroidism 03/18/2013  . Pulmonary hypertension (HCC)   . Hx of CABG 2012 08/30/2011  . Obesity, morbid-BMI 45   . Type 2 diabetes, uncontrolled, with neuropathy (HCC)   . Ischemic cardiomyopathy-30-35% May 2016 08/13/2011  . Hyperlipidemia LDL goal <70 08/10/2011    Current Outpatient Prescriptions:  .  aspirin 81 MG tablet, Take 1 tablet (81 mg total) by mouth daily., Disp: 30 tablet, Rfl: 9 .  atorvastatin (LIPITOR) 40 MG tablet, Take 1 tablet (40 mg total) by mouth daily., Disp: 90 tablet, Rfl: 3 .  lactulose (CHRONULAC) 10 GM/15ML solution, Take 45 mLs (30 g total) by mouth 2 (two) times daily., Disp: 2700 mL, Rfl: 6 .   levothyroxine (SYNTHROID, LEVOTHROID) 50 MCG tablet, Take 1 tablet (50 mcg total) by mouth daily before breakfast., Disp: 30 tablet, Rfl: 6 .  Multiple Vitamins-Minerals (CENTRUM SILVER 50+WOMEN PO), Take 1 tablet by mouth every morning., Disp: , Rfl:  .  potassium chloride SA (K-DUR,KLOR-CON) 20 MEQ tablet, Take 1 tablet (20 mEq total) by mouth daily., Disp: 30 tablet, Rfl: 6 .  pregabalin (LYRICA) 100 MG capsule, Take 100 mg by mouth 2 (two) times daily., Disp: , Rfl:  .  rifaximin (XIFAXAN) 550 MG TABS tablet, Take 1 tablet (550 mg total) by mouth 2 (two) times daily., Disp: 60 tablet, Rfl: 12 .  spironolactone (ALDACTONE) 25 MG tablet, Take 1 tablet (25 mg total) by mouth daily., Disp: 30 tablet, Rfl: 5 .  torsemide (DEMADEX) 20 MG tablet, Take 4 tablets (80 mg total) by mouth 2 (two) times daily., Disp: 240 tablet, Rfl: 12 .  acetaminophen (TYLENOL) 650 MG CR tablet, Take 650 mg by mouth every 8 (eight) hours as needed (for arthritic pain). , Disp: , Rfl:  Allergies  Allergen Reactions  . Lisinopril Nausea And Vomiting, Other (See Comments) and Cough    Coughing, throwing up, waking up coughing  . Metformin And Related Anaphylaxis     Social History   Social History  . Marital status: Divorced    Spouse name: N/A  . Number of children: 3  . 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  Smoking status: Former Smoker    Types: Cigarettes    Quit date: 11/11/1978  . Smokeless tobacco: Never Used     Comment: 03/17/2013 "only a social smoker when I did smoke; ever bought any"  . Alcohol use Yes     Comment: OCCASIONAL  . Drug use: No  . Sexual activity: Not Currently    Birth control/ protection: Post-menopausal   Other Topics Concern  . Not on file   Social History Narrative   Lives with her sister and daughter.  Ambulates independently.    Physical Exam  Pulmonary/Chest: No respiratory distress.  Abdominal: She exhibits  no distension. There is no tenderness. There is no guarding.  Musculoskeletal: She exhibits edema.  Skin: Skin is warm and dry. She is not diaphoretic.        Future Appointments Date Time Provider Department Center  04/08/2017 3:30 PM [facility] PA/NP [facility] None  04/18/2017 11:30 AM [facility] PA/NP [facility] None    ATF pt CAO x4 sitting in the living room. Pt stated that she is real tired because she worked outside yesterday for a few hours.  Pt was administered IV lasix on Wednesday.  She stated that she only lost 2lbs. She took laxative last night due to constipation but still hasn't had a bowel movement.  Pt is on doxycycline until Thursday morning (rx complete) for her right hand that she injured a few weeks ago.  Pt denies sob, dizziness, headache and chest pain.  She has taken all of her medications without missing any doses.  rx bottles verified and pill box refilled.   Pt's vitals noted; heart clinic notified of pt's HR and BP.  Wednesday pt's 4 lead showed bradycardic rate around 53.  I spoke Chantel and she stated that she will notify the physicians and call me back if changes are needed.   **rx called in: Levothyroxine lyrica xifaxan (filled until tues, no pills on tues) Spiro Torsemide  BP 100/62 (BP Location: Left Arm)   Pulse (!) 47   Wt 217 lb (98.4 kg)   SpO2 98%   BMI 35.02 kg/m   cbg 171 Weight yesterday-217 Last visit weight-219 (clinic)    Fraida Veldman, EMT Paramedic 04/04/2017    ACTION: Home visit completed Next visit planned for next monday

## 2017-04-05 DIAGNOSIS — I2510 Atherosclerotic heart disease of native coronary artery without angina pectoris: Secondary | ICD-10-CM | POA: Diagnosis not present

## 2017-04-05 DIAGNOSIS — I130 Hypertensive heart and chronic kidney disease with heart failure and stage 1 through stage 4 chronic kidney disease, or unspecified chronic kidney disease: Secondary | ICD-10-CM

## 2017-04-05 DIAGNOSIS — I255 Ischemic cardiomyopathy: Secondary | ICD-10-CM

## 2017-04-05 DIAGNOSIS — I5043 Acute on chronic combined systolic (congestive) and diastolic (congestive) heart failure: Secondary | ICD-10-CM

## 2017-04-08 ENCOUNTER — Ambulatory Visit (HOSPITAL_COMMUNITY)
Admission: RE | Admit: 2017-04-08 | Discharge: 2017-04-08 | Disposition: A | Discharge status: Home / Self Care | Payer: Medicare Other | Source: Ambulatory Visit | Attending: Cardiology | Admitting: Cardiology

## 2017-04-08 VITALS — BP 120/76 | HR 60 | Wt 224.0 lb

## 2017-04-08 DIAGNOSIS — G4733 Obstructive sleep apnea (adult) (pediatric): Secondary | ICD-10-CM | POA: Insufficient documentation

## 2017-04-08 DIAGNOSIS — Z66 Do not resuscitate: Secondary | ICD-10-CM | POA: Diagnosis not present

## 2017-04-08 DIAGNOSIS — I252 Old myocardial infarction: Secondary | ICD-10-CM | POA: Insufficient documentation

## 2017-04-08 DIAGNOSIS — N183 Chronic kidney disease, stage 3 unspecified: Secondary | ICD-10-CM

## 2017-04-08 DIAGNOSIS — I130 Hypertensive heart and chronic kidney disease with heart failure and stage 1 through stage 4 chronic kidney disease, or unspecified chronic kidney disease: Secondary | ICD-10-CM | POA: Insufficient documentation

## 2017-04-08 DIAGNOSIS — Z7982 Long term (current) use of aspirin: Secondary | ICD-10-CM | POA: Diagnosis not present

## 2017-04-08 DIAGNOSIS — I272 Pulmonary hypertension, unspecified: Secondary | ICD-10-CM | POA: Insufficient documentation

## 2017-04-08 DIAGNOSIS — M109 Gout, unspecified: Secondary | ICD-10-CM | POA: Diagnosis not present

## 2017-04-08 DIAGNOSIS — M1990 Unspecified osteoarthritis, unspecified site: Secondary | ICD-10-CM | POA: Diagnosis not present

## 2017-04-08 DIAGNOSIS — I255 Ischemic cardiomyopathy: Secondary | ICD-10-CM | POA: Insufficient documentation

## 2017-04-08 DIAGNOSIS — I340 Nonrheumatic mitral (valve) insufficiency: Secondary | ICD-10-CM | POA: Insufficient documentation

## 2017-04-08 DIAGNOSIS — E875 Hyperkalemia: Secondary | ICD-10-CM | POA: Insufficient documentation

## 2017-04-08 DIAGNOSIS — Z6836 Body mass index (BMI) 36.0-36.9, adult: Secondary | ICD-10-CM | POA: Insufficient documentation

## 2017-04-08 DIAGNOSIS — G5603 Carpal tunnel syndrome, bilateral upper limbs: Secondary | ICD-10-CM | POA: Insufficient documentation

## 2017-04-08 DIAGNOSIS — I5022 Chronic systolic (congestive) heart failure: Secondary | ICD-10-CM | POA: Diagnosis present

## 2017-04-08 DIAGNOSIS — I2510 Atherosclerotic heart disease of native coronary artery without angina pectoris: Secondary | ICD-10-CM | POA: Diagnosis not present

## 2017-04-08 DIAGNOSIS — I5023 Acute on chronic systolic (congestive) heart failure: Secondary | ICD-10-CM | POA: Diagnosis not present

## 2017-04-08 DIAGNOSIS — E785 Hyperlipidemia, unspecified: Secondary | ICD-10-CM | POA: Insufficient documentation

## 2017-04-08 DIAGNOSIS — E1122 Type 2 diabetes mellitus with diabetic chronic kidney disease: Secondary | ICD-10-CM | POA: Diagnosis not present

## 2017-04-08 DIAGNOSIS — Z951 Presence of aortocoronary bypass graft: Secondary | ICD-10-CM | POA: Insufficient documentation

## 2017-04-08 DIAGNOSIS — R29818 Other symptoms and signs involving the nervous system: Secondary | ICD-10-CM

## 2017-04-08 LAB — BASIC METABOLIC PANEL
ANION GAP: 11 (ref 5–15)
BUN: 67 mg/dL — ABNORMAL HIGH (ref 6–20)
CALCIUM: 8.8 mg/dL — AB (ref 8.9–10.3)
CO2: 19 mmol/L — ABNORMAL LOW (ref 22–32)
CREATININE: 1.29 mg/dL — AB (ref 0.44–1.00)
Chloride: 103 mmol/L (ref 101–111)
GFR, EST AFRICAN AMERICAN: 49 mL/min — AB (ref >60)
GFR, EST NON AFRICAN AMERICAN: 42 mL/min — AB (ref >60)
Glucose, Bld: 196 mg/dL — ABNORMAL HIGH (ref 65–99)
Potassium: 5.3 mmol/L — ABNORMAL HIGH (ref 3.5–5.1)
SODIUM: 133 mmol/L — AB (ref 135–145)

## 2017-04-08 LAB — BRAIN NATRIURETIC PEPTIDE: B NATRIURETIC PEPTIDE 5: 823.3 pg/mL — AB (ref 0.0–100.0)

## 2017-04-08 MED ORDER — METOLAZONE 2.5 MG PO TABS: 2.5000 mg | ORAL_TABLET | ORAL | 3 refills | Status: DC

## 2017-04-08 MED ORDER — FUROSEMIDE 10 MG/ML IJ SOLN: 80.0000 mg | Freq: Every day | INTRAMUSCULAR | 0 refills | Status: DC

## 2017-04-08 MED FILL — FUROSEMIDE 20 MG/2 ML VIAL: 10 | 4 days supply | Qty: 16 | Fill #0

## 2017-04-08 MED FILL — metOLazone 2.5 MG TABS: 2.5 | 1 days supply | Qty: 1 | Fill #0

## 2017-04-08 NOTE — Progress Notes (Signed)
Patient ID: Christine Knight, female   DOB: [birth year], 66 y.o.   MRN: [id number]     Advanced Heart Failure Clinic Note   PCP: Christine Knight  HF: Christine Knight   HPI: Christine Knight is a 66 y.o. female w/ PMHx significant for morbid obesity, systolic HF due to ICM (10/12 EF 25%; 07/2012 EF 35%; 03/18/13 EF 20-25%), CAD with h/o NSTEMI s/p CABG Oct 2012, pulmonary hypertension (PA peak 72mmHg), and poorly controlled DM 2.  [facility] dys[facility], leg edema, and weight gain.  Lasix gtt used and she diuresed 15 liters. Diuresed 29 pounds.  ECHO- EF 20-25%.  Discharge weight 264 pounds.    Admitted January, June, October, December 2017 with marked volume overload. Diuresed with high dose lasix.   Admitted February and April 2018 with marked volume overload. Most recent admit 4/24 through 03/13/17 with marked volume overload. Diuresed with lasix drip 30 mg per hour + metolazone + milrinone. Transitioned to torsemide 80 mg twice a day + spiro. Discharge weight was 200 pounds.   She returns today for follow up. Weight up 5 lbs from last visit. Has been drinking 5 bottles of water a day plus coffee and more.  She states her breathing is stable.  Denies CP. Has mild DOE with steps.  Did have some SOB last week trying to lift something. Appetite OK. States she is watching her salt very closely. Denies fevers or chills. Paramedicine following. Missed her medications Sunday night.   Review of systems complete and found to be negative unless listed in HPI.    03/18/13 ECHO EF 20-25% 06/17/2013 ECHO EF 35% RV ok (read formally after visit 35-40%) 03/15/2015 ECHO EF 30-35%, grade 3 DD, Moderate TR, Severely reduced RV, PA peak pressure 81 mm Hg 12/09/2015 ECHO EF 15-20%, Decreased LV diastolic compliance, RV mod reduced, PA peak 57 mmHg, Severe TR  04/14/16 Echo 15-20%  10/2016: EF Peak PA pressure 89 mm hg  EF 35-40% RV dysfunction.    [facility] Hem[facility] RA 25/25 (23) RV pressure 92/13  RV  EDP 26 PA pressure 86/34 (56) PW mean 37 AO Pressure 114/72 (88) LV Pressure 101/12 (26) LV EDP 26 PVR 3.24 Fick CO/CI 5.85 / 2.41L/min   Prox LAD lesion, 100% stenosed. Prox Cx lesion, 99% stenosed. Mid RCA lesion, 80% stenosed. Dist RCA lesion, 100% stenosed. Patent grafts    Past Medical History:  Diagnosis Date  . Arthritis   . CAD (coronary artery disease) 08/2011   NSTEMI with subsequent CABG 08/13/2011 with LIMA-LAD, SVG-diagonal, SVG-OM, SVG-PDA  . Carpal tunnel syndrome on both sides    "post OHS in 08/2011; resolved now" (03/17/2013)  . CHF (congestive heart failure) (HCC)   . Chronic systolic heart failure (HCC)    Chronic systolic CHF  . Diabetes mellitus type 2 in obese (HCC) 08/10/11  . Dyspnea   . Ejection fraction < 50%    EF 20%, October, 2012  //  EF 25% December, 2012  //   EF 35%, echo, September, 2013  . Hyperlipidemia 08/10/11  . Hypertension   . Ischemic cardiomyopathy 08/13/2011   EF 20% in 08/2011, still 25% 10/21/2011  . Mitral regurgitation    Mild by echo 10/21/11  . Myocardial infarction (HCC) 08/2011  . Obesity, morbid (HCC)   . Orthopnoea    "progressively worse over last 3 wks" (03/17/2013)  . Pleural effusion    Requiring L thoracentesis 09/02/11  . Pulmonary hypertension (HCC)  Echo, September, 2013, 72 mmHg.    Current Outpatient Prescriptions  Medication Sig Dispense Refill  . acetaminophen (TYLENOL) 650 MG CR tablet Take 650 mg by mouth every 8 (eight) hours as needed (for arthritic pain).     Marland Kitchen aspirin 81 MG tablet Take 1 tablet (81 mg total) by mouth daily. 30 tablet 9  . atorvastatin (LIPITOR) 40 MG tablet Take 1 tablet (40 mg total) by mouth daily. 90 tablet 3  . lactulose (CHRONULAC) 10 GM/15ML solution Take 45 mLs (30 g total) by mouth 2 (two) times daily. 2700 mL 6  . levothyroxine (SYNTHROID, LEVOTHROID) 50 MCG tablet Take 1 tablet (50 mcg total) by mouth daily before breakfast. 30 tablet 6  . Multiple Vitamins-Minerals  (CENTRUM SILVER 50+WOMEN PO) Take 1 tablet by mouth every morning.    . potassium chloride SA (K-DUR,KLOR-CON) 20 MEQ tablet Take 1 tablet (20 mEq total) by mouth daily. 30 tablet 6  . pregabalin (LYRICA) 100 MG capsule Take 100 mg by mouth 2 (two) times daily.    . rifaximin (XIFAXAN) 550 MG TABS tablet Take 1 tablet (550 mg total) by mouth 2 (two) times daily. 60 tablet 12  . spironolactone (ALDACTONE) 25 MG tablet Take 1 tablet (25 mg total) by mouth daily. 30 tablet 5  . torsemide (DEMADEX) 20 MG tablet Take 4 tablets (80 mg total) by mouth 2 (two) times daily. 240 tablet 12   No current facility-administered medications for this encounter.      PHYSICAL EXAM: Vitals:   04/08/17 1532  BP: 120/76  Pulse: 60  SpO2: 97%  Weight: 224 lb (101.6 kg)   Wt Readings from Last 3 Encounters:  04/08/17 224 lb (101.6 kg)  04/04/17 217 lb (98.4 kg)  04/01/17 219 lb 9.6 oz (99.6 kg)    General: Obese. Well appearing. No resp difficulty. HEENT: Normal Neck: Thick. JVP to jaw. Carotids 2+ bilat; no bruits. No thyromegaly or nodule noted. Cor: PMI nondisplaced. RRR, No M/G/R noted Lungs: Distant Abdomen: Obese, soft, non-tender, distended, no HSM. No bruits or masses. +BS  Extremities: no cyanosis, clubbing, or rash. BLE 2+ edema Neuro: alert & orientedx3, cranial nerves grossly intact. moves all 4 extremities w/o difficulty. Affect pleasant   ASSESSMENT/PLAN 1.A/C Chronic systolic HF due to ICM EF 15-20% Echo 04/14/2016 Volume status trending up despite Paramedicine and HH.  -NYHA III-IIIb. IIIb.  - Will have [facility] give 2 doses of 80 mg IV lasix and dose of 2.5 mg metolazone tomorrow am.  Parmadicine to follow up later this week.  - Continue current dose to torsemide and potassium.  - No Ace/ARNi/Entresto with CKD.  - Intolerant bidil with difficulty with headaches, dizziness and hypotension.  - Reinforced fluid restriction to < 2 L daily, sodium restriction to less than 2000 mg daily, and  the importance of daily weights.   2. CAD s/p NSTEMI and CABG 10/12  - No chest pain. No change to meds as below.  - Continue atorvastatin 40 mg daily.  - Severe native CAD with patent grafts on [facility] 12/13/15. Continue ASA 81 mg.  3. Morbid obesity - Needs to increase activity and monitor portion size.  4. Pulmonary HTN - mild to mod [facility] 12/13/15. PVR 3.24.  - No change 5. CKD stage III-V - Repeat BMET today.  - Advised her to avoid NSAIDs.  6. DM2  - Episodes of daily hypoglycemia. Stop glipizide. She will schedule follow up with PCP. No change.  7. Obstructive sleep apnea: - had  sleep study 12/25/16 and has severe obstructive sleep apnea. Will send message to Dr. Mayford Knife for her office to set her up with a CPAP.  -Message sent to Dr Mayford Knife for CPAP. No change.  8. Gout - Per PCP 9. Hyperkalemia  - BMET today.  10. Advanced directives - Completed GOLD DNR form 01/2017.   - Will hold off on referring to Hospice now, but discussed with family that it is likely a possibility. No change.   IV lasix as above.  Pt has incredibly poor insight into her disease despite multiple and ongoing discussions concerning fluid and salt intake and medical regimen. Continues to drink well over 2 L each day and miss doses of medication.   Pt is at high risk for re-admission. Continue weekly follow up. May need to schedule weekly/bi-weekly IV lasix moving forward if possible.   [name], PA-C  3:41 PM   Greater than 50% of the 25 minute visit was spent in counseling/coordination of care regarding disease state education, medication reconciliation, and fluid/salt compliance.

## 2017-04-08 NOTE — Patient Instructions (Addendum)
[facility] to give 80 mg mg IV Lasix on 04/09/17 and 04/10/17 (this will replace your morning dose of torsemide)  Take ONE dose of Metolazone on 04/09/17   Labs today We will only contact you if something comes back abnormal or we need to make some changes. Otherwise no news is good news!   Your physician recommends that you schedule a follow-up appointment as scheduled next week with [name],NP   Do the following things EVERYDAY: 1) Weigh yourself in the morning before breakfast. Write it down and keep it in a log. 2) Take your medicines as prescribed 3) Eat low salt foods-Limit salt (sodium) to 2000 mg per day.  4) Stay as active as you can everyday 5) Limit all fluids for the day to less than 2 liters

## 2017-04-08 NOTE — Progress Notes (Signed)
Advanced Heart Failure Medication Review by a Pharmacist  Does the patient  feel that his/her medications are working for him/her?  yes  Has the patient been experiencing any side effects to the medications prescribed?  no  Does the patient measure his/her own blood pressure or blood glucose at home?  yes   Does the patient have any problems obtaining medications due to transportation or finances?   no  Understanding of regimen: good Understanding of indications: good Potential of compliance: good Patient understands to avoid NSAIDs. Patient understands to avoid decongestants.  Issues to address at subsequent visits: None   Pharmacist comments: [name] is a pleasant 66 yo F presenting with her medication bottles. She reports good compliance with her regimen but did admit to missing last Sunday evening's dose of medications 2/2 falling asleep early. No other medication-related questions or concerns for me at this time.   Tyler DeisErika K. Bonnye FavaNicolsen, PharmD, BCPS, CPP Clinical Pharmacist Pager: [phone] Phone: [phone] 04/08/2017 3:40 PM      Time with patient: 10 minutes Preparation and documentation time: 2 minutes Total time: 12 minutes

## 2017-04-11 ENCOUNTER — Other Ambulatory Visit (HOSPITAL_COMMUNITY): Payer: Self-pay

## 2017-04-11 MED FILL — LEVOTHYROXINE 50 MCG TABLET: 50 | 30 days supply | Qty: 30 | Fill #1

## 2017-04-11 MED FILL — XIFAXAN 550 MG TABLET: 550 | 30 days supply | Qty: 60 | Fill #2

## 2017-04-11 NOTE — Progress Notes (Signed)
Paramedicine Encounter    Patient ID: Christine Knight, female    DOB: August 14, 1951, 66 y.o.   MRN: [id number]    Patient Care Team: Ronnald Collum as PCP - General (Cardiology) Luis Abed, MD (Cardiology)  Patient Active Problem List   Diagnosis Date Noted  . Cellulitis of right hand 03/18/2017  . Acute on chronic systolic CHF (congestive heart failure) (HCC) 03/04/2017  . OSA (obstructive sleep apnea) 02/25/2017  . RVF (right ventricular failure) (HCC)   . Hepatic encephalopathy (HCC) 01/03/2017  . Systolic CHF, acute on chronic (HCC) 01/02/2017  . Acute on chronic systolic ACC/AHA stage C congestive heart failure (HCC) 12/06/2016  . CAD (coronary artery disease) 12/06/2016  . Suspected sleep apnea 11/21/2016  . Acute on chronic diastolic CHF (congestive heart failure) (HCC) 08/26/2016  . CHF (congestive heart failure) (HCC) 08/26/2016  . CKD (chronic kidney disease) stage 3, GFR 30-59 ml/min 05/02/2016  . Hypokalemia   . Chest tightness   . Mass of lower lobe of left lung 07/25/2015  . Primary gout 07/25/2015  . NSTEMI- declined cath 05/29/2015  . DJD (degenerative joint disease), multiple sites 11/07/2014  . Painful diabetic neuropathy (HCC) 10/10/2014  . Hyperkalemia 06/17/2013  . Chronic systolic heart failure (HCC) 04/02/2013  . Hypothyroidism 03/18/2013  . Pulmonary hypertension (HCC)   . Hx of CABG 2012 08/30/2011  . Obesity, morbid-BMI 45   . Type 2 diabetes, uncontrolled, with neuropathy (HCC)   . Ischemic cardiomyopathy-30-35% May 2016 08/13/2011  . Hyperlipidemia LDL goal <70 08/10/2011    Current Outpatient Prescriptions:  .  acetaminophen (TYLENOL) 650 MG CR tablet, Take 650 mg by mouth every 8 (eight) hours as needed (for arthritic pain). , Disp: , Rfl:  .  aspirin 81 MG tablet, Take 1 tablet (81 mg total) by mouth daily., Disp: 30 tablet, Rfl: 9 .  atorvastatin (LIPITOR) 40 MG tablet, Take 1 tablet (40 mg total) by mouth daily., Disp: 90 tablet,  Rfl: 3 .  levothyroxine (SYNTHROID, LEVOTHROID) 50 MCG tablet, Take 1 tablet (50 mcg total) by mouth daily before breakfast., Disp: 30 tablet, Rfl: 6 .  Multiple Vitamins-Minerals (CENTRUM SILVER 50+WOMEN PO), Take 1 tablet by mouth every morning., Disp: , Rfl:  .  potassium chloride SA (K-DUR,KLOR-CON) 20 MEQ tablet, Take 1 tablet (20 mEq total) by mouth daily., Disp: 30 tablet, Rfl: 6 .  pregabalin (LYRICA) 100 MG capsule, Take 100 mg by mouth 2 (two) times daily., Disp: , Rfl:  .  rifaximin (XIFAXAN) 550 MG TABS tablet, Take 1 tablet (550 mg total) by mouth 2 (two) times daily., Disp: 60 tablet, Rfl: 12 .  spironolactone (ALDACTONE) 25 MG tablet, Take 1 tablet (25 mg total) by mouth daily., Disp: 30 tablet, Rfl: 5 .  torsemide (DEMADEX) 20 MG tablet, Take 4 tablets (80 mg total) by mouth 2 (two) times daily., Disp: 240 tablet, Rfl: 12 .  furosemide (LASIX) 10 MG/ML injection, Inject 8 mLs (80 mg total) into the muscle daily., Disp: 16 mL, Rfl: 0 .  lactulose (CHRONULAC) 10 GM/15ML solution, Take 45 mLs (30 g total) by mouth 2 (two) times daily., Disp: 2700 mL, Rfl: 6 .  metolazone (ZAROXOLYN) 2.5 MG tablet, Take 1 tablet (2.5 mg total) by mouth as directed., Disp: 1 tablet, Rfl: 3 Allergies  Allergen Reactions  . Lisinopril Nausea And Vomiting, Other (See Comments) and Cough    Coughing, throwing up, waking up coughing  . Metformin And Related Anaphylaxis     Social History  Social History  . Marital status: Divorced    Spouse name: N/A  . Number of children: 3  . 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 Smoking status: Former Smoker    Types: Cigarettes    Quit date: 11/11/1978  . Smokeless tobacco: Never Used     Comment: 03/17/2013 "only a social smoker when I did smoke; ever bought any"  . Alcohol use Yes     Comment: OCCASIONAL  . Drug use: No  . Sexual activity: Not Currently    Birth control/ protection:  Post-menopausal   Other Topics Concern  . Not on file   Social History Narrative   Lives with her sister and daughter.  Ambulates independently.    Physical Exam  Pulmonary/Chest: No respiratory distress. She has no wheezes. She has no rales.  Abdominal: She exhibits no distension. There is no tenderness. There is no guarding.  Skin: Skin is warm and dry. She is not diaphoretic.        Future Appointments Date Time Provider Department Center  04/18/2017 11:30 AM [facility] PA/NP [facility] None  04/21/2017 3:40 PM [facility] MM 2 [facility]    ATF pt CAO x4 cooking breakfast (salmon).  She stated that lost 5lbs because the [facility] nurse gave her IV lasix. She stated that she "feels great/ she's been drinking a lot of water and that's why she's gaining so much weight". She ate soup last night but stated that she didn't eat a lot. She has taken all of the mediations in her pill box except this am meds.  She denies sob, dizziness, headache and chest pain.  Pt has edema in both lower legs.  F/u appointment 6/8 at the heart failure clinic.  rx bottles verified and pill box refilled.  I will return later with the following medications because her daughter is working late today.   rx called: Levothyroxine (thur-fri) 1st box/none in second box Rifaximin completely out   BP 100/64 (BP Location: Left Arm, Patient Position: Sitting, Cuff Size: Normal)   Pulse (!) 56   Resp 16   Wt 218 lb (98.9 kg)   SpO2 98%   BMI 35.19 kg/m   Weight yesterday-223 Last visit weight-217    [name], EMT Paramedic 04/11/2017    ACTION: Home visit completed

## 2017-04-16 ENCOUNTER — Other Ambulatory Visit (HOSPITAL_COMMUNITY): Payer: Self-pay

## 2017-04-16 NOTE — Progress Notes (Signed)
Paramedicine Encounter    Patient ID: Christine Knight, female    DOB: 1951/08/01, 66 y.o.   MRN: [id number]   Patient Care Team: Ronnald Collum as PCP - General (Cardiology) Luis Abed, MD (Cardiology)  Patient Active Problem List   Diagnosis Date Noted  . Cellulitis of right hand 03/18/2017  . Acute on chronic systolic CHF (congestive heart failure) (HCC) 03/04/2017  . OSA (obstructive sleep apnea) 02/25/2017  . RVF (right ventricular failure) (HCC)   . Hepatic encephalopathy (HCC) 01/03/2017  . Systolic CHF, acute on chronic (HCC) 01/02/2017  . Acute on chronic systolic ACC/AHA stage C congestive heart failure (HCC) 12/06/2016  . CAD (coronary artery disease) 12/06/2016  . Suspected sleep apnea 11/21/2016  . Acute on chronic diastolic CHF (congestive heart failure) (HCC) 08/26/2016  . CHF (congestive heart failure) (HCC) 08/26/2016  . CKD (chronic kidney disease) stage 3, GFR 30-59 ml/min 05/02/2016  . Hypokalemia   . Chest tightness   . Mass of lower lobe of left lung 07/25/2015  . Primary gout 07/25/2015  . NSTEMI- declined cath 05/29/2015  . DJD (degenerative joint disease), multiple sites 11/07/2014  . Painful diabetic neuropathy (HCC) 10/10/2014  . Hyperkalemia 06/17/2013  . Chronic systolic heart failure (HCC) 04/02/2013  . Hypothyroidism 03/18/2013  . Pulmonary hypertension (HCC)   . Hx of CABG 2012 08/30/2011  . Obesity, morbid-BMI 45   . Type 2 diabetes, uncontrolled, with neuropathy (HCC)   . Ischemic cardiomyopathy-30-35% May 2016 08/13/2011  . Hyperlipidemia LDL goal <70 08/10/2011    Current Outpatient Prescriptions:  .  acetaminophen (TYLENOL) 650 MG CR tablet, Take 650 mg by mouth every 8 (eight) hours as needed (for arthritic pain). , Disp: , Rfl:  .  aspirin 81 MG tablet, Take 1 tablet (81 mg total) by mouth daily., Disp: 30 tablet, Rfl: 9 .  atorvastatin (LIPITOR) 40 MG tablet, Take 1 tablet (40 mg total) by mouth daily., Disp: 90 tablet,  Rfl: 3 .  lactulose (CHRONULAC) 10 GM/15ML solution, Take 45 mLs (30 g total) by mouth 2 (two) times daily., Disp: 2700 mL, Rfl: 6 .  levothyroxine (SYNTHROID, LEVOTHROID) 50 MCG tablet, Take 1 tablet (50 mcg total) by mouth daily before breakfast., Disp: 30 tablet, Rfl: 6 .  Multiple Vitamins-Minerals (CENTRUM SILVER 50+WOMEN PO), Take 1 tablet by mouth every morning., Disp: , Rfl:  .  potassium chloride SA (K-DUR,KLOR-CON) 20 MEQ tablet, Take 1 tablet (20 mEq total) by mouth daily., Disp: 30 tablet, Rfl: 6 .  pregabalin (LYRICA) 100 MG capsule, Take 100 mg by mouth 2 (two) times daily., Disp: , Rfl:  .  rifaximin (XIFAXAN) 550 MG TABS tablet, Take 1 tablet (550 mg total) by mouth 2 (two) times daily., Disp: 60 tablet, Rfl: 12 .  spironolactone (ALDACTONE) 25 MG tablet, Take 1 tablet (25 mg total) by mouth daily., Disp: 30 tablet, Rfl: 5 .  torsemide (DEMADEX) 20 MG tablet, Take 4 tablets (80 mg total) by mouth 2 (two) times daily., Disp: 240 tablet, Rfl: 12 .  furosemide (LASIX) 10 MG/ML injection, Inject 8 mLs (80 mg total) into the muscle daily., Disp: 16 mL, Rfl: 0 .  metolazone (ZAROXOLYN) 2.5 MG tablet, Take 1 tablet (2.5 mg total) by mouth as directed. (Patient not taking: Reported on 04/16/2017), Disp: 1 tablet, Rfl: 3 Allergies  Allergen Reactions  . Lisinopril Nausea And Vomiting, Other (See Comments) and Cough    Coughing, throwing up, waking up coughing  . Metformin And Related Anaphylaxis  Social History   Social History  . Marital status: Divorced    Spouse name: N/A  . Number of children: 3  . 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 Smoking status: Former Smoker    Types: Cigarettes    Quit date: 11/11/1978  . Smokeless tobacco: Never Used     Comment: 03/17/2013 "only a social smoker when I did smoke; ever bought any"  . Alcohol use Yes     Comment: OCCASIONAL  . Drug use: No  . Sexual activity: Not Currently     Birth control/ protection: Post-menopausal   Other Topics Concern  . Not on file   Social History Narrative   Lives with her sister and daughter.  Ambulates independently.    Physical Exam      Future Appointments Date Time Provider Department Center  04/18/2017 11:30 AM [facility] PA/NP [facility] None  04/21/2017 3:40 PM [facility] MM 2 [facility]   BP 102/70   Pulse 70   Resp 16   Wt 211 lb (95.7 kg)   SpO2 98%   BMI 34.06 kg/m  Weight yesterday-212 Last visit weight-218 [facility]-283  Pt reports she feels good other than the arthritic/gout pains, she said if it wasn't for that she would be out shopping. meds verified and pill box refilled accordingly. No sob, no dizziness, no other complaints at this time. She still has some swelling to lower extremities, she is wearing compression stockings. CBG as noted, she states she ate an icee earlier, she ate some shellfish earlier this week-advised her to be cautious of the sodium in the fish and she reports it only happens once in a while. And advised her to be cautious of the fluid intake and she measured and accounted for the icee for her fluids.   ACTION: Home visit completed  Kerry HoughKatie Nachman Sundt, EMT-Paramedic 04/16/17

## 2017-04-18 ENCOUNTER — Ambulatory Visit (HOSPITAL_COMMUNITY)
Admission: RE | Admit: 2017-04-18 | Discharge: 2017-04-18 | Disposition: A | Discharge status: Home / Self Care | Payer: Medicare Other | Source: Ambulatory Visit | Attending: Cardiology | Admitting: Cardiology

## 2017-04-18 VITALS — BP 110/66 | HR 94 | Wt 216.5 lb

## 2017-04-18 DIAGNOSIS — Z7982 Long term (current) use of aspirin: Secondary | ICD-10-CM | POA: Diagnosis not present

## 2017-04-18 DIAGNOSIS — I272 Pulmonary hypertension, unspecified: Secondary | ICD-10-CM | POA: Diagnosis not present

## 2017-04-18 DIAGNOSIS — I2510 Atherosclerotic heart disease of native coronary artery without angina pectoris: Secondary | ICD-10-CM | POA: Insufficient documentation

## 2017-04-18 DIAGNOSIS — E875 Hyperkalemia: Secondary | ICD-10-CM | POA: Diagnosis not present

## 2017-04-18 DIAGNOSIS — Z66 Do not resuscitate: Secondary | ICD-10-CM | POA: Insufficient documentation

## 2017-04-18 DIAGNOSIS — I255 Ischemic cardiomyopathy: Secondary | ICD-10-CM | POA: Insufficient documentation

## 2017-04-18 DIAGNOSIS — Z951 Presence of aortocoronary bypass graft: Secondary | ICD-10-CM | POA: Insufficient documentation

## 2017-04-18 DIAGNOSIS — G5603 Carpal tunnel syndrome, bilateral upper limbs: Secondary | ICD-10-CM | POA: Insufficient documentation

## 2017-04-18 DIAGNOSIS — I130 Hypertensive heart and chronic kidney disease with heart failure and stage 1 through stage 4 chronic kidney disease, or unspecified chronic kidney disease: Secondary | ICD-10-CM | POA: Diagnosis not present

## 2017-04-18 DIAGNOSIS — N183 Chronic kidney disease, stage 3 unspecified: Secondary | ICD-10-CM

## 2017-04-18 DIAGNOSIS — I340 Nonrheumatic mitral (valve) insufficiency: Secondary | ICD-10-CM | POA: Insufficient documentation

## 2017-04-18 DIAGNOSIS — I5022 Chronic systolic (congestive) heart failure: Secondary | ICD-10-CM | POA: Insufficient documentation

## 2017-04-18 DIAGNOSIS — E785 Hyperlipidemia, unspecified: Secondary | ICD-10-CM | POA: Diagnosis not present

## 2017-04-18 DIAGNOSIS — M109 Gout, unspecified: Secondary | ICD-10-CM | POA: Insufficient documentation

## 2017-04-18 DIAGNOSIS — Z6834 Body mass index (BMI) 34.0-34.9, adult: Secondary | ICD-10-CM | POA: Insufficient documentation

## 2017-04-18 DIAGNOSIS — I252 Old myocardial infarction: Secondary | ICD-10-CM | POA: Insufficient documentation

## 2017-04-18 DIAGNOSIS — I5020 Unspecified systolic (congestive) heart failure: Secondary | ICD-10-CM | POA: Diagnosis not present

## 2017-04-18 DIAGNOSIS — G4733 Obstructive sleep apnea (adult) (pediatric): Secondary | ICD-10-CM | POA: Insufficient documentation

## 2017-04-18 DIAGNOSIS — E1122 Type 2 diabetes mellitus with diabetic chronic kidney disease: Secondary | ICD-10-CM | POA: Insufficient documentation

## 2017-04-18 DIAGNOSIS — I5023 Acute on chronic systolic (congestive) heart failure: Secondary | ICD-10-CM | POA: Diagnosis not present

## 2017-04-18 DIAGNOSIS — M1990 Unspecified osteoarthritis, unspecified site: Secondary | ICD-10-CM | POA: Diagnosis not present

## 2017-04-18 LAB — BASIC METABOLIC PANEL
Anion gap: 10 (ref 5–15)
BUN: 72 mg/dL — AB (ref 6–20)
CHLORIDE: 100 mmol/L — AB (ref 101–111)
CO2: 27 mmol/L (ref 22–32)
Calcium: 8.9 mg/dL (ref 8.9–10.3)
Creatinine, Ser: 1.62 mg/dL — ABNORMAL HIGH (ref 0.44–1.00)
GFR calc Af Amer: 37 mL/min — ABNORMAL LOW (ref >60)
GFR calc non Af Amer: 32 mL/min — ABNORMAL LOW (ref >60)
GLUCOSE: 138 mg/dL — AB (ref 65–99)
POTASSIUM: 4.8 mmol/L (ref 3.5–5.1)
Sodium: 137 mmol/L (ref 135–145)

## 2017-04-18 NOTE — Progress Notes (Signed)
Advanced Heart Failure Medication Review by a Pharmacist  Does the patient  feel that his/her medications are working for him/her?  yes  Has the patient been experiencing any side effects to the medications prescribed?  no  Does the patient measure his/her own blood pressure or blood glucose at home?  no   Does the patient have any problems obtaining medications due to transportation or finances?   no  Understanding of regimen: good Understanding of indications: good Potential of compliance: good Patient understands to avoid NSAIDs. Patient understands to avoid decongestants.  Issues to address at subsequent visits: none   Pharmacist comments: [name] is a pleasant 66 y/o female presenting with her 2 daughters and her medication bottles and pill box. She reports good adherence to her medications. Recommended patient separate her lactulose BID and her potassium QD, for better absorption of her potassium. Patient had no medication-related questions or concerns at this time for me.   Christine Knight   Time with patient: 6 minutes Preparation and documentation time: 3 minutes Total time: 9 minutes

## 2017-04-18 NOTE — Progress Notes (Signed)
Patient ID: Christine Knight, female   DOB: 03/10/1951, 66 y.o.   MRN: [id number]     Advanced Heart Failure Clinic Note   PCP: Arnette FeltsMike Duran  HF: Dr. Gala RomneyBensimhon   HPI: Ms.Hardeman is a 66 y.o. female w/ PMHx significant for morbid obesity, systolic HF due to ICM (10/12 EF 25%; 07/2012 EF 35%; 03/18/13 EF 20-25%), CAD with h/o NSTEMI s/p CABG Oct 2012, pulmonary hypertension (PA peak 72mmHg), and poorly controlled DM 2.  [facility] for dyspnea, leg edema, and weight gain.  Lasix gtt used and she diuresed 15 liters. Diuresed 29 pounds.  ECHO- EF 20-25%.  Discharge weight 264 pounds.    Admitted January, June, October, December 2017 with marked volume overload. Diuresed with high dose lasix.   Admitted February and April 2018 with marked volume overload. Most recent admit 4/24 through 03/13/17 with marked volume overload. Diuresed with lasix drip 30 mg per hour + metolazone + milrinone. Transitioned to torsemide 80 mg twice a day + spiro. Discharge weight was 200 pounds.   Today she returns for HF follow up. Last visit she given IV lasix for 2 days. Overall feeling ok other than gout pain. Mild dyspnea with exertion. Denies PND/Orthopnea. Weight at home 211- 213 pounds. Says she has a good appetite. Taking all medications. [facility]   Review of systems complete and found to be negative unless listed in HPI.    03/18/13 ECHO EF 20-25% 06/17/2013 ECHO EF 35% RV ok (read formally after visit 35-40%) 03/15/2015 ECHO EF 30-35%, grade 3 DD, Moderate TR, Severely reduced RV, PA peak pressure 81 mm Hg 12/09/2015 ECHO EF 15-20%, Decreased LV diastolic compliance, RV mod reduced, PA peak 57 mmHg, Severe TR  04/14/16 Echo 15-20%  10/2016: EF Peak PA pressure 89 mm hg  EF 35-40% RV dysfunction.    [facility] Hemodynamics RA 25/25 (23) RV pressure 92/13  RV EDP 26 PA pressure 86/34 (56) PW mean 37 AO Pressure 114/72 (88) LV Pressure 101/12 (26) LV EDP 26 PVR  3.24 Fick CO/CI 5.85 / 2.41L/min Prox LAD lesion, 100% stenosed. Prox Cx lesion, 99% stenosed. Mid RCA lesion, 80% stenosed. Dist RCA lesion, 100% stenosed. Patent grafts    Past Medical History:  Diagnosis Date  . Arthritis   . CAD (coronary artery disease) 08/2011   NSTEMI with subsequent CABG 08/13/2011 with LIMA-LAD, SVG-diagonal, SVG-OM, SVG-PDA  . Carpal tunnel syndrome on both sides    "post OHS in 08/2011; resolved now" (03/17/2013)  . CHF (congestive heart failure) (HCC)   . Chronic systolic heart failure (HCC)    Chronic systolic CHF  . Diabetes mellitus type 2 in obese (HCC) 08/10/11  . Dyspnea   . Ejection fraction < 50%    EF 20%, October, 2012  //  EF 25% December, 2012  //   EF 35%, echo, September, 2013  . Hyperlipidemia 08/10/11  . Hypertension   . Ischemic cardiomyopathy 08/13/2011   EF 20% in 08/2011, still 25% 10/21/2011  . Mitral regurgitation    Mild by echo 10/21/11  . Myocardial infarction (HCC) 08/2011  . Obesity, morbid (HCC)   . Orthopnoea    "progressively worse over last 3 wks" (03/17/2013)  . Pleural effusion    Requiring L thoracentesis 09/02/11  . Pulmonary hypertension (HCC)    Echo, September, 2013, 72 mmHg.    Current Outpatient Prescriptions  Medication Sig Dispense Refill  . acetaminophen (TYLENOL) 650 MG CR tablet Take 650  mg by mouth every 8 (eight) hours as needed (for arthritic pain).     Marland Kitchen aspirin 81 MG tablet Take 1 tablet (81 mg total) by mouth daily. 30 tablet 9  . atorvastatin (LIPITOR) 40 MG tablet Take 1 tablet (40 mg total) by mouth daily. 90 tablet 3  . lactulose (CHRONULAC) 10 GM/15ML solution Take 45 mLs (30 g total) by mouth 2 (two) times daily. 2700 mL 6  . levothyroxine (SYNTHROID, LEVOTHROID) 50 MCG tablet Take 1 tablet (50 mcg total) by mouth daily before breakfast. 30 tablet 6  . Multiple Vitamins-Minerals (CENTRUM SILVER 50+WOMEN PO) Take 1 tablet by mouth every morning.    . potassium chloride SA (K-DUR,KLOR-CON) 20  MEQ tablet Take 20 mEq by mouth daily with lunch.    . pregabalin (LYRICA) 100 MG capsule Take 100 mg by mouth 2 (two) times daily.    . psyllium (REGULOID) 0.52 g capsule Take 0.52 g by mouth daily as needed.    . rifaximin (XIFAXAN) 550 MG TABS tablet Take 1 tablet (550 mg total) by mouth 2 (two) times daily. 60 tablet 12  . spironolactone (ALDACTONE) 25 MG tablet Take 1 tablet (25 mg total) by mouth daily. 30 tablet 5  . torsemide (DEMADEX) 20 MG tablet Take 4 tablets (80 mg total) by mouth 2 (two) times daily. 240 tablet 12  . metolazone (ZAROXOLYN) 2.5 MG tablet Take 1 tablet (2.5 mg total) by mouth as directed. (Patient not taking: Reported on 04/16/2017) 1 tablet 3   No current facility-administered medications for this encounter.      PHYSICAL EXAM: Vitals:   04/18/17 1136  BP: 110/66  Pulse: 94  SpO2: 97%  Weight: 216 lb 8 oz (98.2 kg)   Wt Readings from Last 3 Encounters:  04/18/17 216 lb 8 oz (98.2 kg)  04/16/17 211 lb (95.7 kg)  04/11/17 218 lb (98.9 kg)   General:  Well appearing. No resp difficulty. Walked in the clinic with her 2 daughters.  HEENT: normal Neck: supple. JVP ~10 . Carotids 2+ bilat; no bruits. No lymphadenopathy or thryomegaly appreciated. Cor: PMI nondisplaced. Regular rate & rhythm. No rubs, gallops or murmurs. Lungs: clear Abdomen: soft, nontender, nondistended. No hepatosplenomegaly. No bruits or masses. Good bowel sounds. Extremities: no cyanosis, clubbing, rash, R and LLE trace edema Neuro: alert & orientedx3, cranial nerves grossly intact. moves all 4 extremities w/o difficulty. Affect pleasant  ASSESSMENT/PLAN 1.Chronic systolic HF due to ICM EF 15-20% Echo 04/14/2016 -NYHA IIIb. Volume status stable.  Continue current dose of torsemide, spiro, and potassium No bb with h/o  low output - No Ace/ARNi/Entresto with CKD. BMET today.  - Intolerant bidil with difficulty with headaches, dizziness and hypotension.  - Reinforced fluid restriction to <  2 L daily, sodium restriction to less than 2000 mg daily, and the importance of daily weights.   2. CAD s/p NSTEMI and CABG 10/12  No CP. Continue statin and aspirin.  - Continue atorvastatin 40 mg daily.  - Severe native CAD with patent grafts on [facility] 12/13/15.  3. Morbid obesity Discuss portion control. Body mass index is 34.94 kg/m. 4. Pulmonary HTN - mild to mod RHC 12/13/15. PVR 3.24.  - No change 5. CKD stage III-V - - Advised her to avoid NSAIDs.  6. DM2  Per PCP. -.  7. Obstructive sleep apnea: - had sleep study 12/25/16 and has severe obstructive sleep apnea.  8. Gout - Per PCP 9. Hyperkalemia  BMET today  10. Advanced directives -  Completed GOLD DNR form 01/2017.   - Will hold off on referring to Hospice now, but discussed with family that it is likely a possibility.   Follow up in 2 weeks. She remains at high risk for readmit due to poor insight related to HF care.  Continue [facility] and Paramedicine.   Tonye Becket, NP  11:50 AM

## 2017-04-18 NOTE — Patient Instructions (Signed)
Labs today  Your physician recommends that you schedule a follow-up appointment in: 2 weeks  

## 2017-04-21 ENCOUNTER — Ambulatory Visit: Payer: Medicare Other

## 2017-04-25 ENCOUNTER — Other Ambulatory Visit (HOSPITAL_COMMUNITY): Payer: Self-pay

## 2017-04-25 ENCOUNTER — Telehealth (HOSPITAL_COMMUNITY): Payer: Self-pay | Admitting: *Deleted

## 2017-04-25 ENCOUNTER — Other Ambulatory Visit (HOSPITAL_COMMUNITY): Payer: Self-pay | Admitting: *Deleted

## 2017-04-25 MED ORDER — METOLAZONE 2.5 MG PO TABS: 2.5000 mg | ORAL_TABLET | ORAL | 3 refills | Status: DC

## 2017-04-25 MED FILL — metOLazone 2.5 MG TABS: 2.5 | 3 days supply | Qty: 3 | Fill #0

## 2017-04-25 NOTE — Telephone Encounter (Signed)
[name] with Paramedicine called and stated patient's weight is up 12 lbs since 04/18/2017.  Patient has been noncomplient with diet and fluid resctrictions.  Patient admitted to eating lots of icecream and pork ribs. No other symptoms, BP is 98/60.  Otilio SaberAndy Tillery, [place] advises patient to take metolazone today and have [facility] give her two days of 80 mg IV lasix and BMET the day after last dose is given.  I called [name] back and gave her the plan.  She will start IV to leave in until after the last dose of IV lasix is given.  I have sent a message [facility] and told her the plan.  I have also faxed prescription for IV lasix and bmet to [phone].

## 2017-04-25 NOTE — Progress Notes (Signed)
Paramedicine Encounter    Patient ID: Christine Knight, female    DOB: August 19, 1951, 66 y.o.   MRN: [id number]    Patient Care Team: Ronnald Collum as PCP - General (Cardiology) Luis Abed, MD (Cardiology)  Patient Active Problem List   Diagnosis Date Noted  . Cellulitis of right hand 03/18/2017  . Acute on chronic systolic CHF (congestive heart failure) (HCC) 03/04/2017  . OSA (obstructive sleep apnea) 02/25/2017  . RVF (right ventricular failure) (HCC)   . Hepatic encephalopathy (HCC) 01/03/2017  . Systolic CHF, acute on chronic (HCC) 01/02/2017  . Acute on chronic systolic ACC/AHA stage C congestive heart failure (HCC) 12/06/2016  . CAD (coronary artery disease) 12/06/2016  . Suspected sleep apnea 11/21/2016  . Acute on chronic diastolic CHF (congestive heart failure) (HCC) 08/26/2016  . CHF (congestive heart failure) (HCC) 08/26/2016  . CKD (chronic kidney disease) stage 3, GFR 30-59 ml/min 05/02/2016  . Hypokalemia   . Chest tightness   . Mass of lower lobe of left lung 07/25/2015  . Primary gout 07/25/2015  . NSTEMI- declined cath 05/29/2015  . DJD (degenerative joint disease), multiple sites 11/07/2014  . Painful diabetic neuropathy (HCC) 10/10/2014  . Hyperkalemia 06/17/2013  . Chronic systolic heart failure (HCC) 04/02/2013  . Hypothyroidism 03/18/2013  . Pulmonary hypertension (HCC)   . Hx of CABG 2012 08/30/2011  . Obesity, morbid-BMI 45   . Type 2 diabetes, uncontrolled, with neuropathy (HCC)   . Ischemic cardiomyopathy-30-35% May 2016 08/13/2011  . Hyperlipidemia LDL goal <70 08/10/2011    Current Outpatient Prescriptions:  .  aspirin 81 MG tablet, Take 1 tablet (81 mg total) by mouth daily., Disp: 30 tablet, Rfl: 9 .  atorvastatin (LIPITOR) 40 MG tablet, Take 1 tablet (40 mg total) by mouth daily., Disp: 90 tablet, Rfl: 3 .  lactulose (CHRONULAC) 10 GM/15ML solution, Take 45 mLs (30 g total) by mouth 2 (two) times daily., Disp: 2700 mL, Rfl: 6 .   levothyroxine (SYNTHROID, LEVOTHROID) 50 MCG tablet, Take 1 tablet (50 mcg total) by mouth daily before breakfast., Disp: 30 tablet, Rfl: 6 .  Multiple Vitamins-Minerals (CENTRUM SILVER 50+WOMEN PO), Take 1 tablet by mouth every morning., Disp: , Rfl:  .  potassium chloride SA (K-DUR,KLOR-CON) 20 MEQ tablet, Take 20 mEq by mouth daily with lunch., Disp: , Rfl:  .  pregabalin (LYRICA) 100 MG capsule, Take 100 mg by mouth 2 (two) times daily., Disp: , Rfl:  .  rifaximin (XIFAXAN) 550 MG TABS tablet, Take 1 tablet (550 mg total) by mouth 2 (two) times daily., Disp: 60 tablet, Rfl: 12 .  spironolactone (ALDACTONE) 25 MG tablet, Take 1 tablet (25 mg total) by mouth daily., Disp: 30 tablet, Rfl: 5 .  torsemide (DEMADEX) 20 MG tablet, Take 4 tablets (80 mg total) by mouth 2 (two) times daily., Disp: 240 tablet, Rfl: 12 .  acetaminophen (TYLENOL) 650 MG CR tablet, Take 650 mg by mouth every 8 (eight) hours as needed (for arthritic pain). , Disp: , Rfl:  .  metolazone (ZAROXOLYN) 2.5 MG tablet, Take 1 tablet (2.5 mg total) by mouth as directed. (Patient not taking: Reported on 04/16/2017), Disp: 1 tablet, Rfl: 3 .  psyllium (REGULOID) 0.52 g capsule, Take 0.52 g by mouth daily as needed., Disp: , Rfl:  Allergies  Allergen Reactions  . Lisinopril Nausea And Vomiting, Other (See Comments) and Cough    Coughing, throwing up, waking up coughing  . Metformin And Related Anaphylaxis     Social  History   Social History  . Marital status: Divorced    Spouse name: N/A  . Number of children: 3  . 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 Smoking status: Former Smoker    Types: Cigarettes    Quit date: 11/11/1978  . Smokeless tobacco: Never Used     Comment: 03/17/2013 "only a social smoker when I did smoke; ever bought any"  . Alcohol use Yes     Comment: OCCASIONAL  . Drug use: No  . Sexual activity: Not Currently    Birth control/ protection:  Post-menopausal   Other Topics Concern  . Not on file   Social History Narrative   Lives with her sister and daughter.  Ambulates independently.    Physical Exam  Pulmonary/Chest: No respiratory distress. She has no wheezes. She has no rales.  Abdominal: She exhibits no distension. There is no tenderness. There is no guarding.  Musculoskeletal: She exhibits edema.  Skin: Skin is warm and dry. She is not diaphoretic.        Future Appointments Date Time Provider Department Center  04/30/2017 3:30 PM [facility] PA/NP [facility] None    ATF pt CAO x4 just waking up, she stated that she and her daughter was up last night.  She stated that they were up making ice cream. Pt has an increase in weight of 12 lbs.  She stated that she ate country ham yesterday; steak and baked potatoes (only butter) Wednesday; hotdogs, luncheon meat, and fruits. She had dizziness a couple of days this week which subsided on its own.  She denies sob, dizziness, and chest pain.  Pt hasn't taken her morning meds this morning. rx bottles verified and pill box refilled.  No refills called in today.   Heart clinic notified of her weight gain, susie will call back after speaking with Mardelle MatteAndy.  We talked about her diet and about her quality of life.  She stated that she over indulges when she eat.     BP 98/60 (BP Location: Left Arm, Patient Position: Sitting, Cuff Size: Large)   Pulse 97   Resp 16   Wt 228 lb (103.4 kg)   SpO2 98%   BMI 36.80 kg/m   cbg 274  Weight yesterday-224 Last visit weight-216 (clinic 6/8)   Christine Knight, EMT Paramedic 04/25/2017   ACTION: Home visit completed Next visit planned for next thursday

## 2017-04-30 ENCOUNTER — Encounter (HOSPITAL_COMMUNITY): Payer: Medicare Other

## 2017-05-01 ENCOUNTER — Telehealth (HOSPITAL_COMMUNITY): Payer: Self-pay | Admitting: Cardiology

## 2017-05-01 NOTE — Telephone Encounter (Signed)
[facility]17 Cr 1.27 k 5.4  Per vo [name],NP (04/30/17) Hold potassium and recheck labs next week  Detailed message left for patient 6/21 with instructions to hold potassium and will recheck labs next week. Also message to para medicine with all instructions as well.

## 2017-05-02 ENCOUNTER — Other Ambulatory Visit (HOSPITAL_COMMUNITY): Payer: Self-pay

## 2017-05-02 MED FILL — TORSEMIDE 20 MG TABLET: 20 | 30 days supply | Qty: 240 | Fill #3

## 2017-05-02 MED FILL — SPIRONOLACTONE 25 MG TABLET: 25 | 30 days supply | Qty: 30 | Fill #3

## 2017-05-02 MED FILL — LYRICA 100 MG CAPSULE: 100 | 30 days supply | Qty: 60 | Fill #2

## 2017-05-02 NOTE — Progress Notes (Signed)
Paramedicine Encounter    Patient ID: Iantha Titsworth, female    DOB: 07/17/1951, 66 y.o.   MRN: [id number]    Patient Care Team: Ronnald Collum as PCP - General (Cardiology) Luis Abed, MD (Cardiology)  Patient Active Problem List   Diagnosis Date Noted  . Cellulitis of right hand 03/18/2017  . Acute on chronic systolic CHF (congestive heart failure) (HCC) 03/04/2017  . OSA (obstructive sleep apnea) 02/25/2017  . RVF (right ventricular failure) (HCC)   . Hepatic encephalopathy (HCC) 01/03/2017  . Systolic CHF, acute on chronic (HCC) 01/02/2017  . Acute on chronic systolic ACC/AHA stage C congestive heart failure (HCC) 12/06/2016  . CAD (coronary artery disease) 12/06/2016  . Suspected sleep apnea 11/21/2016  . Acute on chronic diastolic CHF (congestive heart failure) (HCC) 08/26/2016  . CHF (congestive heart failure) (HCC) 08/26/2016  . CKD (chronic kidney disease) stage 3, GFR 30-59 ml/min 05/02/2016  . Hypokalemia   . Chest tightness   . Mass of lower lobe of left lung 07/25/2015  . Primary gout 07/25/2015  . NSTEMI- declined cath 05/29/2015  . DJD (degenerative joint disease), multiple sites 11/07/2014  . Painful diabetic neuropathy (HCC) 10/10/2014  . Hyperkalemia 06/17/2013  . Chronic systolic heart failure (HCC) 04/02/2013  . Hypothyroidism 03/18/2013  . Pulmonary hypertension (HCC)   . Hx of CABG 2012 08/30/2011  . Obesity, morbid-BMI 45   . Type 2 diabetes, uncontrolled, with neuropathy (HCC)   . Ischemic cardiomyopathy-30-35% May 2016 08/13/2011  . Hyperlipidemia LDL goal <70 08/10/2011    Current Outpatient Prescriptions:  .  aspirin 81 MG tablet, Take 1 tablet (81 mg total) by mouth daily., Disp: 30 tablet, Rfl: 9 .  atorvastatin (LIPITOR) 40 MG tablet, Take 1 tablet (40 mg total) by mouth daily., Disp: 90 tablet, Rfl: 3 .  levothyroxine (SYNTHROID, LEVOTHROID) 50 MCG tablet, Take 1 tablet (50 mcg total) by mouth daily before breakfast., Disp: 30  tablet, Rfl: 6 .  Multiple Vitamins-Minerals (CENTRUM SILVER 50+WOMEN PO), Take 1 tablet by mouth every morning., Disp: , Rfl:  .  pregabalin (LYRICA) 100 MG capsule, Take 100 mg by mouth 2 (two) times daily., Disp: , Rfl:  .  rifaximin (XIFAXAN) 550 MG TABS tablet, Take 1 tablet (550 mg total) by mouth 2 (two) times daily., Disp: 60 tablet, Rfl: 12 .  spironolactone (ALDACTONE) 25 MG tablet, Take 1 tablet (25 mg total) by mouth daily., Disp: 30 tablet, Rfl: 5 .  torsemide (DEMADEX) 20 MG tablet, Take 4 tablets (80 mg total) by mouth 2 (two) times daily., Disp: 240 tablet, Rfl: 12 .  acetaminophen (TYLENOL) 650 MG CR tablet, Take 650 mg by mouth every 8 (eight) hours as needed (for arthritic pain). , Disp: , Rfl:  .  lactulose (CHRONULAC) 10 GM/15ML solution, Take 45 mLs (30 g total) by mouth 2 (two) times daily., Disp: 2700 mL, Rfl: 6 .  metolazone (ZAROXOLYN) 2.5 MG tablet, Take 1 tablet (2.5 mg total) by mouth as directed., Disp: 3 tablet, Rfl: 3 .  potassium chloride SA (K-DUR,KLOR-CON) 20 MEQ tablet, Take 20 mEq by mouth daily with lunch., Disp: , Rfl:  .  psyllium (REGULOID) 0.52 g capsule, Take 0.52 g by mouth daily as needed., Disp: , Rfl:  Allergies  Allergen Reactions  . Lisinopril Nausea And Vomiting, Other (See Comments) and Cough    Coughing, throwing up, waking up coughing  . Metformin And Related Anaphylaxis     Social History   Social History  .  Marital status: Divorced    Spouse name: N/A  . Number of children: 3  . 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 Smoking status: Former Smoker    Types: Cigarettes    Quit date: 11/11/1978  . Smokeless tobacco: Never Used     Comment: 03/17/2013 "only a social smoker when I did smoke; ever bought any"  . Alcohol use Yes     Comment: OCCASIONAL  . Drug use: No  . Sexual activity: Not Currently    Birth control/ protection: Post-menopausal   Other Topics Concern  . Not  on file   Social History Narrative   Lives with her sister and daughter.  Ambulates independently.    Physical Exam  Pulmonary/Chest: No respiratory distress. She has no wheezes. She has no rales.  Abdominal: She exhibits no distension. There is no guarding.  Musculoskeletal: She exhibits edema.  Skin: Skin is warm and dry. She is not diaphoretic.        No future appointments.  ATF pt CAO x4 in the shower when I arrived her daughter let me in.  Pt had taken all of her medications besides this morning meds. She stated that she didn't lose much water weight after the iv lasix was administered and she wasn't given the metalozone.  She denies sob, dizziness, headache and chest pain.  She stated that she had been in a lot of pain this week due to gout flare up.  She had to cancel this weeks appointment with the heart failure clinic due to the same.  She went to see her primary physician and he gave her a prescription but she doesn't know where it is. Pt has edema noted to both lower extremities from her feet to her knees/+3.     rx bottles verified and pill box refilled.   *rx called in: Spiro  lyrica xifaxan Torsemide   *recieved a message from chantal to discontinue potassium this week until to get lab work redone  BP 98/62 (BP Location: Left Arm, Patient Position: Sitting, Cuff Size: Large)   Pulse 91   Resp 16   Wt 227 lb (103 kg)   SpO2 98%   BMI 36.64 kg/m   Weight yesterday-didn't weigh Last visit weight-228    Leslie Jester, EMT Paramedic 05/02/2017    ACTION: Home visit completed Next visit planned for next wednesday

## 2017-05-05 NOTE — Telephone Encounter (Signed)
6/22 [name] with paramedicine went to visit patient in the home and held potassium for the week per [name] NP-C.  Attempted to call again today to schedule her lab apt this week, no answer, left message to call back to schedule this.  Ave FilterBradley, Megan Genevea, RN

## 2017-05-09 ENCOUNTER — Encounter: Payer: Self-pay | Admitting: Internal Medicine

## 2017-05-09 ENCOUNTER — Telehealth (HOSPITAL_COMMUNITY): Payer: Self-pay | Admitting: *Deleted

## 2017-05-09 ENCOUNTER — Telehealth: Payer: Self-pay | Admitting: Internal Medicine

## 2017-05-09 NOTE — Telephone Encounter (Signed)
Christine Knight with [facility] called saying they have been trying to reach patient but have been unsuccessful.   I called and spoke with patient and told her they needed to come draw some lab work and if she would be available this afternoon and she stated she would be.  I called Christine Knight back and told her that patient will be expecting them. No further questions at this time.

## 2017-05-09 NOTE — Telephone Encounter (Signed)
Paged by Carollee HerterShannon at [facility]. On return call, Carollee HerterShannon is with patient, who endorses increased shortness of breath throughout the day, 2 lb weight gain today, and some chest pressure. Spoke to patient over the phone, who states that she usually comes into ER when she feels this poorly. Counseled patient that coming to ER will allow best way to see and evaluate her overnight, and she is amenable to this. [facility].  Christine Knight, overnight cardiology hospitalist

## 2017-05-10 ENCOUNTER — Encounter (HOSPITAL_COMMUNITY): Payer: Self-pay | Admitting: *Deleted

## 2017-05-10 ENCOUNTER — Emergency Department (HOSPITAL_COMMUNITY): Payer: Medicare Other

## 2017-05-10 ENCOUNTER — Inpatient Hospital Stay (HOSPITAL_COMMUNITY)
Admission: EM | Admit: 2017-05-10 | Discharge: 2017-05-30 | DRG: 291 | Disposition: A | Discharge status: Other Acute Inpatient Hospital | Payer: Medicare Other | Attending: Internal Medicine | Admitting: Internal Medicine

## 2017-05-10 DIAGNOSIS — Z7982 Long term (current) use of aspirin: Secondary | ICD-10-CM

## 2017-05-10 DIAGNOSIS — Z6838 Body mass index (BMI) 38.0-38.9, adult: Secondary | ICD-10-CM | POA: Diagnosis not present

## 2017-05-10 DIAGNOSIS — N179 Acute kidney failure, unspecified: Secondary | ICD-10-CM | POA: Diagnosis present

## 2017-05-10 DIAGNOSIS — I5023 Acute on chronic systolic (congestive) heart failure: Secondary | ICD-10-CM | POA: Diagnosis present

## 2017-05-10 DIAGNOSIS — Z6839 Body mass index (BMI) 39.0-39.9, adult: Secondary | ICD-10-CM

## 2017-05-10 DIAGNOSIS — I50810 Right heart failure, unspecified: Secondary | ICD-10-CM

## 2017-05-10 DIAGNOSIS — I272 Pulmonary hypertension, unspecified: Secondary | ICD-10-CM | POA: Diagnosis present

## 2017-05-10 DIAGNOSIS — E1165 Type 2 diabetes mellitus with hyperglycemia: Secondary | ICD-10-CM | POA: Diagnosis not present

## 2017-05-10 DIAGNOSIS — I5082 Biventricular heart failure: Secondary | ICD-10-CM | POA: Diagnosis present

## 2017-05-10 DIAGNOSIS — Z888 Allergy status to other drugs, medicaments and biological substances status: Secondary | ICD-10-CM

## 2017-05-10 DIAGNOSIS — I340 Nonrheumatic mitral (valve) insufficiency: Secondary | ICD-10-CM | POA: Diagnosis not present

## 2017-05-10 DIAGNOSIS — Z9049 Acquired absence of other specified parts of digestive tract: Secondary | ICD-10-CM | POA: Diagnosis not present

## 2017-05-10 DIAGNOSIS — I252 Old myocardial infarction: Secondary | ICD-10-CM

## 2017-05-10 DIAGNOSIS — E871 Hypo-osmolality and hyponatremia: Secondary | ICD-10-CM | POA: Diagnosis present

## 2017-05-10 DIAGNOSIS — K761 Chronic passive congestion of liver: Secondary | ICD-10-CM | POA: Diagnosis present

## 2017-05-10 DIAGNOSIS — G4733 Obstructive sleep apnea (adult) (pediatric): Secondary | ICD-10-CM | POA: Diagnosis present

## 2017-05-10 DIAGNOSIS — K7290 Hepatic failure, unspecified without coma: Secondary | ICD-10-CM | POA: Diagnosis present

## 2017-05-10 DIAGNOSIS — R0789 Other chest pain: Secondary | ICD-10-CM | POA: Diagnosis present

## 2017-05-10 DIAGNOSIS — N183 Chronic kidney disease, stage 3 unspecified: Secondary | ICD-10-CM | POA: Diagnosis present

## 2017-05-10 DIAGNOSIS — Z87891 Personal history of nicotine dependence: Secondary | ICD-10-CM | POA: Diagnosis not present

## 2017-05-10 DIAGNOSIS — T801XXA Vascular complications following infusion, transfusion and therapeutic injection, initial encounter: Secondary | ICD-10-CM | POA: Diagnosis not present

## 2017-05-10 DIAGNOSIS — R609 Edema, unspecified: Secondary | ICD-10-CM | POA: Diagnosis not present

## 2017-05-10 DIAGNOSIS — E876 Hypokalemia: Secondary | ICD-10-CM | POA: Diagnosis present

## 2017-05-10 DIAGNOSIS — Z79899 Other long term (current) drug therapy: Secondary | ICD-10-CM

## 2017-05-10 DIAGNOSIS — E1122 Type 2 diabetes mellitus with diabetic chronic kidney disease: Secondary | ICD-10-CM | POA: Diagnosis present

## 2017-05-10 DIAGNOSIS — E1140 Type 2 diabetes mellitus with diabetic neuropathy, unspecified: Secondary | ICD-10-CM | POA: Diagnosis not present

## 2017-05-10 DIAGNOSIS — IMO0002 Reserved for concepts with insufficient information to code with codable children: Secondary | ICD-10-CM | POA: Diagnosis present

## 2017-05-10 DIAGNOSIS — I472 Ventricular tachycardia: Secondary | ICD-10-CM | POA: Diagnosis present

## 2017-05-10 DIAGNOSIS — M1990 Unspecified osteoarthritis, unspecified site: Secondary | ICD-10-CM | POA: Diagnosis present

## 2017-05-10 DIAGNOSIS — I2510 Atherosclerotic heart disease of native coronary artery without angina pectoris: Secondary | ICD-10-CM | POA: Diagnosis present

## 2017-05-10 DIAGNOSIS — I480 Paroxysmal atrial fibrillation: Secondary | ICD-10-CM

## 2017-05-10 DIAGNOSIS — I130 Hypertensive heart and chronic kidney disease with heart failure and stage 1 through stage 4 chronic kidney disease, or unspecified chronic kidney disease: Secondary | ICD-10-CM | POA: Diagnosis present

## 2017-05-10 DIAGNOSIS — Z951 Presence of aortocoronary bypass graft: Secondary | ICD-10-CM | POA: Diagnosis not present

## 2017-05-10 DIAGNOSIS — Z8249 Family history of ischemic heart disease and other diseases of the circulatory system: Secondary | ICD-10-CM

## 2017-05-10 DIAGNOSIS — I509 Heart failure, unspecified: Secondary | ICD-10-CM

## 2017-05-10 DIAGNOSIS — S5010XA Contusion of unspecified forearm, initial encounter: Secondary | ICD-10-CM | POA: Diagnosis present

## 2017-05-10 DIAGNOSIS — Z66 Do not resuscitate: Secondary | ICD-10-CM | POA: Diagnosis present

## 2017-05-10 DIAGNOSIS — I493 Ventricular premature depolarization: Secondary | ICD-10-CM | POA: Diagnosis present

## 2017-05-10 DIAGNOSIS — Z9111 Patient's noncompliance with dietary regimen: Secondary | ICD-10-CM

## 2017-05-10 LAB — CBC
HEMATOCRIT: 44.8 % (ref 36.0–46.0)
HEMOGLOBIN: 14.6 g/dL (ref 12.0–15.0)
MCH: 28.3 pg (ref 26.0–34.0)
MCHC: 32.6 g/dL (ref 30.0–36.0)
MCV: 87 fL (ref 78.0–100.0)
Platelets: 250 10*3/uL (ref 150–400)
RBC: 5.15 MIL/uL — AB (ref 3.87–5.11)
RDW: 17.7 % — ABNORMAL HIGH (ref 11.5–15.5)
WBC: 5.8 10*3/uL (ref 4.0–10.5)

## 2017-05-10 LAB — BASIC METABOLIC PANEL
Anion gap: 9 (ref 5–15)
BUN: 60 mg/dL — ABNORMAL HIGH (ref 6–20)
CHLORIDE: 99 mmol/L — AB (ref 101–111)
CO2: 26 mmol/L (ref 22–32)
Calcium: 8.5 mg/dL — ABNORMAL LOW (ref 8.9–10.3)
Creatinine, Ser: 1.29 mg/dL — ABNORMAL HIGH (ref 0.44–1.00)
GFR calc Af Amer: 49 mL/min — ABNORMAL LOW (ref >60)
GFR calc non Af Amer: 42 mL/min — ABNORMAL LOW (ref >60)
Glucose, Bld: 259 mg/dL — ABNORMAL HIGH (ref 65–99)
Potassium: 4 mmol/L (ref 3.5–5.1)
Sodium: 134 mmol/L — ABNORMAL LOW (ref 135–145)

## 2017-05-10 LAB — I-STAT TROPONIN, ED: Troponin i, poc: 0.04 ng/mL (ref 0.00–0.08)

## 2017-05-10 LAB — BRAIN NATRIURETIC PEPTIDE: B Natriuretic Peptide: 669.7 pg/mL — ABNORMAL HIGH (ref 0.0–100.0)

## 2017-05-10 LAB — CBG MONITORING, ED: Glucose-Capillary: 281 mg/dL — ABNORMAL HIGH (ref 65–99)

## 2017-05-10 MED ORDER — ONDANSETRON HCL 4 MG/2ML IJ SOLN: 4.0000 mg | Freq: Four times a day (QID) | INTRAMUSCULAR | Status: DC | PRN

## 2017-05-10 MED ORDER — INSULIN ASPART 100 UNIT/ML ~~LOC~~ SOLN
0.0000 [IU] | Freq: Every day | SUBCUTANEOUS | Status: DC
  Administered 2017-05-11: 3 [IU] via SUBCUTANEOUS
  Administered 2017-05-12 – 2017-05-20 (×2): 2 [IU] via SUBCUTANEOUS
  Administered 2017-05-22: 3 [IU] via SUBCUTANEOUS
  Administered 2017-05-23 – 2017-05-27 (×2): 2 [IU] via SUBCUTANEOUS
  Administered 2017-05-28: 3 [IU] via SUBCUTANEOUS
  Filled 2017-05-10 (×3): qty 0.05
  Filled 2017-05-10: qty 1
  Filled 2017-05-10 (×3): qty 0.05

## 2017-05-10 MED ORDER — PREGABALIN 100 MG PO CAPS
100.0000 mg | ORAL_CAPSULE | Freq: Two times a day (BID) | ORAL | Status: DC
  Administered 2017-05-11 – 2017-05-30 (×40): 100 mg via ORAL
  Filled 2017-05-10 (×3): qty 1
  Filled 2017-05-10 (×2): qty 2
  Filled 2017-05-10 (×2): qty 1
  Filled 2017-05-10: qty 2
  Filled 2017-05-10 (×3): qty 1
  Filled 2017-05-10: qty 2
  Filled 2017-05-10: qty 1
  Filled 2017-05-10: qty 2
  Filled 2017-05-10 (×2): qty 1
  Filled 2017-05-10: qty 2
  Filled 2017-05-10 (×2): qty 1
  Filled 2017-05-10 (×2): qty 2
  Filled 2017-05-10 (×3): qty 1
  Filled 2017-05-10 (×4): qty 2
  Filled 2017-05-10: qty 1
  Filled 2017-05-10 (×3): qty 2
  Filled 2017-05-10 (×3): qty 1
  Filled 2017-05-10 (×2): qty 2
  Filled 2017-05-10: qty 1
  Filled 2017-05-10 (×2): qty 2
  Filled 2017-05-10: qty 1

## 2017-05-10 MED ORDER — LACTULOSE 10 GM/15ML PO SOLN
30.0000 g | Freq: Two times a day (BID) | ORAL | Status: DC
  Administered 2017-05-11 – 2017-05-25 (×29): 30 g via ORAL
  Filled 2017-05-10 (×29): qty 45

## 2017-05-10 MED ORDER — SODIUM CHLORIDE 0.9% FLUSH
3.0000 mL | INTRAVENOUS | Status: DC | PRN
  Administered 2017-05-14: 3 mL via INTRAVENOUS
  Filled 2017-05-10: qty 3

## 2017-05-10 MED ORDER — LEVOTHYROXINE SODIUM 50 MCG PO TABS
50.0000 ug | ORAL_TABLET | Freq: Every day | ORAL | Status: DC
  Administered 2017-05-11 – 2017-05-30 (×20): 50 ug via ORAL
  Filled 2017-05-10 (×21): qty 1

## 2017-05-10 MED ORDER — ACETAMINOPHEN 325 MG PO TABS
650.0000 mg | ORAL_TABLET | ORAL | Status: DC | PRN
  Administered 2017-05-11 – 2017-05-29 (×11): 650 mg via ORAL
  Filled 2017-05-10 (×12): qty 2

## 2017-05-10 MED ORDER — ASPIRIN EC 81 MG PO TBEC
81.0000 mg | DELAYED_RELEASE_TABLET | Freq: Every day | ORAL | Status: DC
  Administered 2017-05-11 – 2017-05-28 (×18): 81 mg via ORAL
  Filled 2017-05-10 (×20): qty 1

## 2017-05-10 MED ORDER — SODIUM CHLORIDE 0.9% FLUSH
3.0000 mL | Freq: Two times a day (BID) | INTRAVENOUS | Status: DC
  Administered 2017-05-11 – 2017-05-15 (×6): 3 mL via INTRAVENOUS

## 2017-05-10 MED ORDER — RIFAXIMIN 550 MG PO TABS
550.0000 mg | ORAL_TABLET | Freq: Two times a day (BID) | ORAL | Status: DC
  Administered 2017-05-11 – 2017-05-30 (×39): 550 mg via ORAL
  Filled 2017-05-10 (×40): qty 1

## 2017-05-10 MED ORDER — POTASSIUM CHLORIDE CRYS ER 20 MEQ PO TBCR
20.0000 meq | EXTENDED_RELEASE_TABLET | Freq: Two times a day (BID) | ORAL | Status: DC
  Administered 2017-05-11 (×2): 20 meq via ORAL
  Filled 2017-05-10 (×3): qty 1

## 2017-05-10 MED ORDER — METOLAZONE 5 MG PO TABS
5.0000 mg | ORAL_TABLET | Freq: Two times a day (BID) | ORAL | Status: DC
  Administered 2017-05-11 – 2017-05-27 (×34): 5 mg via ORAL
  Filled 2017-05-10 (×2): qty 1
  Filled 2017-05-10 (×2): qty 2
  Filled 2017-05-10: qty 1
  Filled 2017-05-10: qty 2
  Filled 2017-05-10: qty 1
  Filled 2017-05-10: qty 2
  Filled 2017-05-10 (×4): qty 1
  Filled 2017-05-10: qty 2
  Filled 2017-05-10 (×4): qty 1
  Filled 2017-05-10 (×2): qty 2
  Filled 2017-05-10 (×5): qty 1
  Filled 2017-05-10 (×3): qty 2
  Filled 2017-05-10 (×2): qty 1
  Filled 2017-05-10: qty 2
  Filled 2017-05-10 (×4): qty 1

## 2017-05-10 MED ORDER — FUROSEMIDE 10 MG/ML IJ SOLN
80.0000 mg | INTRAMUSCULAR | Status: AC
  Administered 2017-05-10: 80 mg via INTRAVENOUS
  Filled 2017-05-10: qty 8

## 2017-05-10 MED ORDER — ATORVASTATIN CALCIUM 40 MG PO TABS
40.0000 mg | ORAL_TABLET | Freq: Every day | ORAL | Status: DC
  Administered 2017-05-11 – 2017-05-29 (×19): 40 mg via ORAL
  Filled 2017-05-10 (×19): qty 1

## 2017-05-10 MED ORDER — HEPARIN (PORCINE) IN NACL 100-0.45 UNIT/ML-% IJ SOLN
1200.0000 [IU]/h | INTRAMUSCULAR | Status: DC
  Administered 2017-05-10 – 2017-05-11 (×2): 1200 [IU]/h via INTRAVENOUS
  Filled 2017-05-10 (×2): qty 250

## 2017-05-10 MED ORDER — SODIUM CHLORIDE 0.9 % IV SOLN: 250.0000 mL | INTRAVENOUS | Status: DC | PRN

## 2017-05-10 MED ORDER — HEPARIN BOLUS VIA INFUSION
4500.0000 [IU] | Freq: Once | INTRAVENOUS | Status: AC
  Administered 2017-05-10: 4500 [IU] via INTRAVENOUS
  Filled 2017-05-10: qty 4500

## 2017-05-10 MED ORDER — SPIRONOLACTONE 25 MG PO TABS
25.0000 mg | ORAL_TABLET | Freq: Every day | ORAL | Status: DC
  Administered 2017-05-11 – 2017-05-29 (×19): 25 mg via ORAL
  Filled 2017-05-10 (×19): qty 1

## 2017-05-10 MED ORDER — FUROSEMIDE 10 MG/ML IJ SOLN
20.0000 mg/h | INTRAVENOUS | Status: DC
  Administered 2017-05-11 (×2): 20 mg/h via INTRAVENOUS
  Filled 2017-05-10 (×3): qty 25

## 2017-05-10 MED ORDER — MILRINONE LACTATE IN DEXTROSE 20-5 MG/100ML-% IV SOLN
0.2500 ug/kg/min | INTRAVENOUS | Status: DC
  Administered 2017-05-10 – 2017-05-11 (×3): 0.25 ug/kg/min via INTRAVENOUS
  Administered 2017-05-12 – 2017-05-16 (×14): 0.375 ug/kg/min via INTRAVENOUS
  Administered 2017-05-17 – 2017-05-29 (×22): 0.25 ug/kg/min via INTRAVENOUS
  Filled 2017-05-10 (×43): qty 100

## 2017-05-10 MED ORDER — NITROGLYCERIN 2 % TD OINT
1.0000 [in_us] | TOPICAL_OINTMENT | Freq: Once | TRANSDERMAL | Status: DC
  Filled 2017-05-10: qty 1

## 2017-05-10 MED ORDER — INSULIN ASPART 100 UNIT/ML ~~LOC~~ SOLN
0.0000 [IU] | Freq: Three times a day (TID) | SUBCUTANEOUS | Status: DC
  Administered 2017-05-11 – 2017-05-12 (×5): 7 [IU] via SUBCUTANEOUS
  Administered 2017-05-13: 4 [IU] via SUBCUTANEOUS
  Administered 2017-05-13: 7 [IU] via SUBCUTANEOUS
  Administered 2017-05-13: 4 [IU] via SUBCUTANEOUS
  Administered 2017-05-14: 7 [IU] via SUBCUTANEOUS
  Administered 2017-05-14: 4 [IU] via SUBCUTANEOUS
  Administered 2017-05-14 – 2017-05-15 (×3): 7 [IU] via SUBCUTANEOUS
  Administered 2017-05-16: 11 [IU] via SUBCUTANEOUS
  Administered 2017-05-16: 4 [IU] via SUBCUTANEOUS
  Administered 2017-05-17 – 2017-05-20 (×5): 11 [IU] via SUBCUTANEOUS
  Administered 2017-05-21: 4 [IU] via SUBCUTANEOUS
  Administered 2017-05-21 – 2017-05-23 (×5): 7 [IU] via SUBCUTANEOUS
  Administered 2017-05-24: 4 [IU] via SUBCUTANEOUS
  Administered 2017-05-24: 11 [IU] via SUBCUTANEOUS
  Administered 2017-05-25 (×2): 4 [IU] via SUBCUTANEOUS
  Administered 2017-05-26: 0 [IU] via SUBCUTANEOUS
  Administered 2017-05-26 – 2017-05-27 (×2): 7 [IU] via SUBCUTANEOUS
  Administered 2017-05-28: 4 [IU] via SUBCUTANEOUS
  Administered 2017-05-28: 7 [IU] via SUBCUTANEOUS
  Administered 2017-05-29 (×3): 4 [IU] via SUBCUTANEOUS
  Administered 2017-05-30 (×2): 3 [IU] via SUBCUTANEOUS
  Filled 2017-05-10 (×21): qty 0.2

## 2017-05-10 NOTE — Progress Notes (Signed)
ANTICOAGULATION CONSULT NOTE - Initial Consult  Pharmacy Consult for heparin Indication: atrial fibrillation  Allergies  Allergen Reactions  . Lisinopril Nausea And Vomiting, Other (See Comments) and Cough    Coughing, throwing up, waking up coughing  . Metformin And Related Anaphylaxis    Patient Measurements: Height: 5\' 6"  (167.6 cm) Weight: 239 lb 6.7 oz (108.6 kg) IBW/kg (Calculated) : 59.3 Heparin Dosing Weight: 85kg  Vital Signs: Temp: 98.3 F (36.8 C) (06/30 1144) Temp Source: Oral (06/30 1144) BP: 121/84 (06/30 1800) Pulse Rate: 95 (06/30 1800)  Labs:  Recent Labs  05/10/17 1148  HGB 14.6  HCT 44.8  PLT 250  CREATININE 1.29*    Estimated Creatinine Clearance: 54.2 mL/min (A) (by C-G formula based on SCr of 1.29 mg/dL (H)).   Medical History: Past Medical History:  Diagnosis Date  . Arthritis   . CAD (coronary artery disease) 08/2011   NSTEMI with subsequent CABG 08/13/2011 with LIMA-LAD, SVG-diagonal, SVG-OM, SVG-PDA  . Carpal tunnel syndrome on both sides    "post OHS in 08/2011; resolved now" (03/17/2013)  . CHF (congestive heart failure) (HCC)   . Chronic systolic heart failure (HCC)    Chronic systolic CHF  . Diabetes mellitus type 2 in obese (HCC) 08/10/11  . Dyspnea   . Ejection fraction < 50%    EF 20%, October, 2012  //  EF 25% December, 2012  //   EF 35%, echo, September, 2013  . Hyperlipidemia 08/10/11  . Hypertension   . Ischemic cardiomyopathy 08/13/2011   EF 20% in 08/2011, still 25% 10/21/2011  . Mitral regurgitation    Mild by echo 10/21/11  . Myocardial infarction (HCC) 08/2011  . Obesity, morbid (HCC)   . Orthopnoea    "progressively worse over last 3 wks" (03/17/2013)  . Pleural effusion    Requiring L thoracentesis 09/02/11  . Pulmonary hypertension (HCC)    Echo, September, 2013, 72 mmHg.    Medications:  Infusions:  . heparin      Assessment: 65 yof presented to the ED with recurrent HF and chest pressure. To start IV  heparin for afib. Baseline CBC is WNL and she is not on anticoagulation PTA.   Goal of Therapy:  Heparin level 0.3-0.7 units/ml Monitor platelets by anticoagulation protocol: Yes   Plan:  Heparin bolus 4500 units IV x 1 Heparin gtt 1200 units/hr Check a 6 hr heparin level Daily heparin level and CBC  Liela Rylee, Drake Leachachel Lynn 05/10/2017,7:10 PM

## 2017-05-10 NOTE — ED Notes (Signed)
RN attempted IV 2x unsuccessfully. Will have another RN attempt.  

## 2017-05-10 NOTE — ED Notes (Signed)
Notified lab about delay in BNP.

## 2017-05-10 NOTE — ED Provider Notes (Signed)
[facility]-EMERGENCY DEPT Provider Note   CSN: [id number] Arrival date & time: 05/10/17  1137     History   Chief Complaint Chief Complaint  Patient presents with  . Chest Pain  . Shortness of Breath    HPI Christine Knight is a 66 y.o. female who presents with chest pain and shortness of breath. Past medical history significant for coronary artery disease s/p CABG, CHF EF 15-20%, pulmonary HTN, type 2 diabetes, CKD stage 3, hypertension, hyperlipidemia. She states she's been more short of breath over the past week. It is worse with exertion and lying flat. She also states she's gained 30 pounds in the past month but endorses compliance to all her medicines. Last night at about 9 PM when she was cooking dinner she developed acute onset of left-sided chest pressure. She also has been having arm swelling, abdominal swelling, worsening leg swelling. She did not want to come to the hospital so she called her home health nurse who contacted her cardiologist's office. They recommended for her to come to the ED for evaluation. She states that her pain and shortness of breath has gotten worse and she's been in the ED. She also reports a headache and nausea. No fever, cough, wheezing, vomiting, diarrhea, dysuria. She on 80mg  of Torsemide BID and reports adherence to this except for today since she came here.  HPI  Past Medical History:  Diagnosis Date  . Arthritis   . CAD (coronary artery disease) 08/2011   NSTEMI with subsequent CABG 08/13/2011 with LIMA-LAD, SVG-diagonal, SVG-OM, SVG-PDA  . Carpal tunnel syndrome on both sides    "post OHS in 08/2011; resolved now" (03/17/2013)  . CHF (congestive heart failure) (HCC)   . Chronic systolic heart failure (HCC)    Chronic systolic CHF  . Diabetes mellitus type 2 in obese (HCC) 08/10/11  . Dyspnea   . Ejection fraction < 50%    EF 20%, October, 2012  //  EF 25% December, 2012  //   EF 35%, echo, September, 2013  . Hyperlipidemia 08/10/11  . Hypertension    . Ischemic cardiomyopathy 08/13/2011   EF 20% in 08/2011, still 25% 10/21/2011  . Mitral regurgitation    Mild by echo 10/21/11  . Myocardial infarction (HCC) 08/2011  . Obesity, morbid (HCC)   . Orthopnoea    "progressively worse over last 3 wks" (03/17/2013)  . Pleural effusion    Requiring L thoracentesis 09/02/11  . Pulmonary hypertension (HCC)    Echo, September, 2013, 72 mmHg.    Patient Active Problem List   Diagnosis Date Noted  . Cellulitis of right hand 03/18/2017  . Acute on chronic systolic CHF (congestive heart failure) (HCC) 03/04/2017  . OSA (obstructive sleep apnea) 02/25/2017  . RVF (right ventricular failure) (HCC)   . Hepatic encephalopathy (HCC) 01/03/2017  . Systolic CHF, acute on chronic (HCC) 01/02/2017  . Acute on chronic systolic ACC/AHA stage C congestive heart failure (HCC) 12/06/2016  . CAD (coronary artery disease) 12/06/2016  . Suspected sleep apnea 11/21/2016  . Acute on chronic diastolic CHF (congestive heart failure) (HCC) 08/26/2016  . CHF (congestive heart failure) (HCC) 08/26/2016  . CKD (chronic kidney disease) stage 3, GFR 30-59 ml/min 05/02/2016  . Hypokalemia   . Chest tightness   . Mass of lower lobe of left lung 07/25/2015  . Primary gout 07/25/2015  . NSTEMI- declined cath 05/29/2015  . DJD (degenerative joint disease), multiple sites 11/07/2014  . Painful diabetic neuropathy (HCC) 10/10/2014  . Hyperkalemia 06/17/2013  .  Chronic systolic heart failure (HCC) 04/02/2013  . Hypothyroidism 03/18/2013  . Pulmonary hypertension (HCC)   . Hx of CABG 2012 08/30/2011  . Obesity, morbid-BMI 45   . Type 2 diabetes, uncontrolled, with neuropathy (HCC)   . Ischemic cardiomyopathy-30-35% May 2016 08/13/2011  . Hyperlipidemia LDL goal <70 08/10/2011    Past Surgical History:  Procedure Laterality Date  . CARDIAC CATHETERIZATION N/A 12/13/2015   Procedure: Right/Left Heart Cath and Coronary/Graft Angiography;  Surgeon: Peter M SwazilandJordan, MD;   [facility];  Service: Cardiovascular;  Laterality: N/A;  . CHOLECYSTECTOMY    . CORONARY ARTERY BYPASS GRAFT  08/13/11   CABG x4 with LIMA to LAD, SVG to Diag, SVG to OM, SVG to PDA, EVH via both thighs  . LAPAROSCOPY  1980's?   "removed gallstones; not gallbladder" (03/17/2013)    OB History    No data available       Home Medications    Prior to Admission medications   Medication Sig Start Date End Date Taking? Authorizing Provider  acetaminophen (TYLENOL) 650 MG CR tablet Take 650 mg by mouth every 8 (eight) hours as needed (for arthritic pain).     [provider]  aspirin 81 MG tablet Take 1 tablet (81 mg total) by mouth daily. 11/15/13   Luis AbedKatz, Jeffrey D, MD  atorvastatin (LIPITOR) 40 MG tablet Take 1 tablet (40 mg total) by mouth daily. 11/22/16 02/25/22  Graciella Freerillery, [name] Andrew, PA-C  lactulose (CHRONULAC) 10 GM/15ML solution Take 45 mLs (30 g total) by mouth 2 (two) times daily. 01/30/17   Graciella Freerillery, [name] Andrew, PA-C  levothyroxine (SYNTHROID, LEVOTHROID) 50 MCG tablet Take 1 tablet (50 mcg total) by mouth daily before breakfast. 03/13/17   Clegg, Amy D, NP  metolazone (ZAROXOLYN) 2.5 MG tablet Take 1 tablet (2.5 mg total) by mouth as directed. 04/25/17   Graciella Freerillery, [name] Andrew, PA-C  Multiple Vitamins-Minerals (CENTRUM SILVER 50+WOMEN PO) Take 1 tablet by mouth every morning.    [provider]  potassium chloride SA (K-DUR,KLOR-CON) 20 MEQ tablet Take 20 mEq by mouth daily with lunch.    [provider]  pregabalin (LYRICA) 100 MG capsule Take 100 mg by mouth 2 (two) times daily.    [provider]  psyllium (REGULOID) 0.52 g capsule Take 0.52 g by mouth daily as needed.    [provider]  rifaximin (XIFAXAN) 550 MG TABS tablet Take 1 tablet (550 mg total) by mouth 2 (two) times daily. 01/17/17   Little IshikawaSmith, Erin E, NP  spironolactone (ALDACTONE) 25 MG tablet Take 1 tablet (25 mg total) by mouth daily. 11/06/16   Bensimhon,  Bevelyn Bucklesaniel R, MD  torsemide (DEMADEX) 20 MG tablet Take 4 tablets (80 mg total) by mouth 2 (two) times daily. 01/17/17   Little IshikawaSmith, Erin E, NP    Family History Family History  Problem Relation Age of Onset  . Heart disease Father   . Hypertension Maternal Grandmother   . Hypertension Sister   . Cancer Neg Hx   . Diabetes Neg Hx   . Kidney disease Neg Hx     Social History Social History  Substance Use Topics  . Smoking status: Former Smoker    Types: Cigarettes    Quit date: 11/11/1978  . Smokeless tobacco: Never Used     Comment: 03/17/2013 "only a social smoker when I did smoke; ever bought any"  . Alcohol use Yes     Comment: OCCASIONAL     Allergies   Lisinopril  and Metformin and related   Review of Systems Review of Systems  Constitutional: Negative for fever.  Respiratory: Positive for shortness of breath. Negative for cough and wheezing.   Cardiovascular: Positive for chest pain and leg swelling.  Gastrointestinal: Positive for abdominal distention and nausea. Negative for abdominal pain, diarrhea and vomiting.  Neurological: Positive for headaches.  All other systems reviewed and are negative.    Physical Exam Updated Vital Signs BP 103/66   Pulse 92   Temp 98.3 F (36.8 C) (Oral)   Resp 16   Ht 5\' 6"  (1.676 m)   Wt 108.6 kg (239 lb 6.7 oz)   SpO2 98%   BMI 38.64 kg/m   Physical Exam  Constitutional: She is oriented to person, place, and time. She appears well-developed and well-nourished. No distress.  Tearful  HENT:  Head: Normocephalic and atraumatic.  Eyes: Conjunctivae are normal. Pupils are equal, round, and reactive to light. Right eye exhibits no discharge. Left eye exhibits no discharge. No scleral icterus.  Neck: Normal range of motion.  Cardiovascular: Normal rate.  An irregularly irregular rhythm present. Exam reveals no gallop and no friction rub.   No murmur heard. Pulmonary/Chest: Effort normal. No respiratory distress. She has no wheezes.  She has rales (in bilateral bases). She exhibits no tenderness.  Abdominal: She exhibits no distension.  Neurological: She is alert and oriented to person, place, and time.  Skin: Skin is warm and dry.  Psychiatric: She has a normal mood and affect. Her behavior is normal.  Nursing note and vitals reviewed.    ED Treatments / Results  Labs (all labs ordered are listed, but only abnormal results are displayed) Labs Reviewed  BASIC METABOLIC PANEL - Abnormal; Notable for the following:       Result Value   Sodium 134 (*)    Chloride 99 (*)    Glucose, Bld 259 (*)    BUN 60 (*)    Creatinine, Ser 1.29 (*)    Calcium 8.5 (*)    GFR calc non Af Amer 42 (*)    GFR calc Af Amer 49 (*)    All other components within normal limits  CBC - Abnormal; Notable for the following:    RBC 5.15 (*)    RDW 17.7 (*)    All other components within normal limits  BRAIN NATRIURETIC PEPTIDE - Abnormal; Notable for the following:    B Natriuretic Peptide 669.7 (*)    All other components within normal limits  I-STAT TROPOININ, ED    EKG  EKG Interpretation  Date/Time:  Saturday May 10 2017 11:43:33 EDT Ventricular Rate:  91 PR Interval:    QRS Duration: 100 QT Interval:  372 QTC Calculation: 457 R Axis:   152 Text Interpretation:  Atrial fibrillation Right axis deviation Incomplete right bundle branch block Possible Right ventricular hypertrophy Anteroseptal infarct , age undetermined Abnormal ECG A fib new from previous Confirmed by Frederick Peers [phone]) on 05/10/2017 2:53:28 PM       Radiology Dg Chest 2 View  Result Date: 05/10/2017 CLINICAL DATA:  Patient reports left sided chest pain with SOB X 1 week. HX heart attack, diabetes EXAM: CHEST  2 VIEW COMPARISON:  03/04/2017 FINDINGS: There stable changes from prior CABG surgery. Cardiac silhouette is mildly enlarged. No mediastinal or hilar masses. Mild vascular congestion with mild interstitial thickening. There is a small left pleural  effusion, stable from the prior exam. No pneumothorax. Skeletal structures are grossly intact. IMPRESSION: 1. Findings  consistent with mild congestive heart failure with cardiomegaly, vascular congestion and mild interstitial thickening. Small left pleural effusion is stable from the prior exam. Electronically Signed   By: Amie Portland M.D.   On: 05/10/2017 12:10    Procedures Procedures (including critical care time)  Medications Ordered in ED Medications  nitroGLYCERIN (NITROGLYN) 2 % ointment 1 inch (1 inch Topical Refused 05/10/17 1643)  furosemide (LASIX) injection 80 mg (80 mg Intravenous Given 05/10/17 1643)     Initial Impression / Assessment and Plan / ED Course  I have reviewed the triage vital signs and the nursing notes.  Pertinent labs & imaging results that were available during my care of the patient were reviewed by me and considered in my medical decision making (see chart for details).  66 year old female presents with worsening CHF and chest pain. She has new onset A. fib and it's unclear when this started. She is clinically volume overloaded on exam. CBC is unremarkable. BMP remarkable for mild hyponatremia, mild hypochloremia, elevated glucose, elevated SCr (which is better than baseline). EKG shows A.fib. Trop is 0.04 which is around baseline. CXR shows evidence of mild pulmonary edema.  Shared visit with Dr. Clarene Duke. Lasix 80mg  IV given. Nitro ordered for pain. Consult to Cardiology was placed. Patient was signed out to [employer] who will facilitate admission.  Final Clinical Impressions(s) / ED Diagnoses   Final diagnoses:  Acute on chronic systolic congestive heart failure [facility])    New Prescriptions New Prescriptions   No medications on file     Beryle Quant 05/10/17 1737    Little, Ambrose Finland, MD 05/11/17 [phone]

## 2017-05-10 NOTE — ED Notes (Signed)
Pt. Requesting beside commode next to bed. Pt. Educated about not getting up without calling out to RN. Pt. Agreed.

## 2017-05-10 NOTE — H&P (Addendum)
Advanced Heart Failure Team History and Physical Note    Primary Cardiologist:  Jamond Neels  Reason for Admission: Recurrent HF and chest pressure   HPI:    Christine Knight is a 66 y.o. female w/ PMHx significant for morbid obesity, systolic HF due to ICM (10/12 EF 25%; Echo 6/17 EF 15-20%), CAD with h/o NSTEMI s/p CABG Oct 2012, CKD III, severe pulmonary hypertension, PAF and poorly controlled DM 2.  She has had numerous admits over the past 2 years for severe biventricular HF and marked volume overoad. Typically requires milrinone support and high-dose lasix drip and metolazone for removal of 40-60 pounds of fluid.  Most recent admit 4/24 through 03/13/17 with marked volume overload. Diuresed with lasix drip 30 mg per hour + metolazone + milrinone. Transitioned to torsemide 80 mg twice a day + spiro. Discharge weight was 200 pounds. Followed by [facility].  Says she has been eating out a lot recently going to [employer] and other places. Has gained over 40 pounds in the past few weeks. Says her diuretics are working well but still gaining weight. On Monday began to develop DOE and orthopnea. Last night was cooking and developed chest pressure. No associated symptoms. CP pressure and dyspnea persisted so came to ER.   Trop 0.04. BNP 669   Studies:  03/18/13 ECHO EF 20-25% 06/17/2013 ECHO EF 35% RV ok (read formally after visit 35-40%) 03/15/2015 ECHO EF 30-35%, grade 3 DD, Moderate TR, Severely reduced RV, PA peak pressure 81 mm Hg 12/09/2015 ECHO EF 15-20%, Decreased LV diastolic compliance, RV mod reduced, PA peak 57 mmHg, Severe TR  04/14/16 Echo 15-20%  10/2016: EF Peak PA pressure 89 mm hg  EF 35-40% RV dysfunction.    [facility] 12/13/15 Hemodynamics RA 25/25 (23) RV pressure 92/13  RV EDP 26 PA pressure 86/34 (56) PW mean 37 AO Pressure 114/72 (88) LV Pressure 101/12 (26) LV EDP 26 PVR 3.24 Fick CO/CI 5.85 / 2.41L/min Prox LAD lesion, 100% stenosed. Prox Cx lesion, 99%  stenosed. Mid RCA lesion, 80% stenosed. Dist RCA lesion, 100% stenosed. Patent grafts  Review of Systems: [y] = yes, [ ]  = no   General: Weight gain [ y]; Weight loss [ ] ; Anorexia [ ] ; Fatigue [ y]; Fever [ ] ; Chills [ ] ; Weakness Cove.Etienne ]  Cardiac: Chest pain/pressure Cove.Etienne ]; Resting SOB [ y]; Exertional SOB Cove.Etienne ]; Orthopnea [ ] ; Pedal Edema Cove.Etienne ]; Palpitations [ ] ; Syncope [ ] ; Presyncope [ ] ; Paroxysmal nocturnal dyspnea[ ]   Pulmonary: Cough [ ] ; Wheezing[ ] ; Hemoptysis[ ] ; Sputum [ ] ; Snoring Cove.Etienne ]  GI: Vomiting[ ] ; Dysphagia[ ] ; Melena[ ] ; Hematochezia [ ] ; Heartburn[ ] ; Abdominal pain [ ] ; Constipation [ ] ; Diarrhea [ ] ; BRBPR [ ]   GU: Hematuria[ ] ; Dysuria [ ] ; Nocturia[ ]   Vascular: Pain in legs with walking [ ] ; Pain in feet with lying flat [ ] ; Non-healing sores [ ] ; Stroke [ ] ; TIA [ ] ; Slurred speech [ ] ;  Neuro: Headaches[ ] ; Vertigo[ ] ; Seizures[ ] ; Paresthesias[ ] ;Blurred vision [ ] ; Diplopia [ ] ; Vision changes [ ]   Ortho/Skin: Arthritis Cove.Etienne ]; Joint pain [ y]; Muscle pain [ ] ; Joint swelling [ ] ; Back Pain [ ] ; Rash [ ]   Psych: Depression[ ] ; Anxiety[ ]   Heme: Bleeding problems [ ] ; Clotting disorders [ ] ; Anemia Cove.Etienne ]  Endocrine: Diabetes [ y]; Thyroid dysfunction[ y]   Home Medications Prior to Admission medications   Medication Sig Start Date End Date Taking? Authorizing Provider  acetaminophen (TYLENOL) 650 MG CR tablet Take 650 mg by mouth every 8 (eight) hours as needed (for arthritic pain).    Yes [provider]  aspirin 81 MG tablet Take 1 tablet (81 mg total) by mouth daily. 11/15/13  Yes Luis Abed, MD  atorvastatin (LIPITOR) 40 MG tablet Take 1 tablet (40 mg total) by mouth daily. 11/22/16 02/25/22 Yes Tillery, Mariam Dollar, PA-C  lactulose (CHRONULAC) 10 GM/15ML solution Take 45 mLs (30 g total) by mouth 2 (two) times daily. 01/30/17  Yes [name], PA-C  levothyroxine (SYNTHROID, LEVOTHROID) 50 MCG tablet Take 1 tablet (50 mcg total) by mouth  daily before breakfast. 03/13/17  Yes Clegg, Amy D, NP  metolazone (ZAROXOLYN) 2.5 MG tablet Take 1 tablet (2.5 mg total) by mouth as directed. Patient taking differently: Take 2.5 mg by mouth as directed. Nurse contacts patient 04/25/17  Yes [name], PA-C  Multiple Vitamins-Minerals (CENTRUM SILVER 50+WOMEN PO) Take 1 tablet by mouth every morning.   Yes [provider]  pregabalin (LYRICA) 100 MG capsule Take 100 mg by mouth 2 (two) times daily.   Yes [provider]  rifaximin (XIFAXAN) 550 MG TABS tablet Take 1 tablet (550 mg total) by mouth 2 (two) times daily. 01/17/17  Yes Little Ishikawa, NP  spironolactone (ALDACTONE) 25 MG tablet Take 1 tablet (25 mg total) by mouth daily. 11/06/16  Yes Nazir Hacker, Bevelyn Buckles, MD  torsemide (DEMADEX) 20 MG tablet Take 4 tablets (80 mg total) by mouth 2 (two) times daily. 01/17/17  Yes Little Ishikawa, NP    Past Medical History: Past Medical History:  Diagnosis Date  . Arthritis   . CAD (coronary artery disease) 08/2011   NSTEMI with subsequent CABG 08/13/2011 with LIMA-LAD, SVG-diagonal, SVG-OM, SVG-PDA  . Carpal tunnel syndrome on both sides    "post OHS in 08/2011; resolved now" (03/17/2013)  . CHF (congestive heart failure) (HCC)   . Chronic systolic heart failure (HCC)    Chronic systolic CHF  . Diabetes mellitus type 2 in obese (HCC) 08/10/11  . Dyspnea   . Ejection fraction < 50%    EF 20%, October, 2012  //  EF 25% December, 2012  //   EF 35%, echo, September, 2013  . Hyperlipidemia 08/10/11  . Hypertension   . Ischemic cardiomyopathy 08/13/2011   EF 20% in 08/2011, still 25% 10/21/2011  . Mitral regurgitation    Mild by echo 10/21/11  . Myocardial infarction (HCC) 08/2011  . Obesity, morbid (HCC)   . Orthopnoea    "progressively worse over last 3 wks" (03/17/2013)  . Pleural effusion    Requiring L thoracentesis 09/02/11  . Pulmonary hypertension (HCC)    Echo, September, 2013, 72 mmHg.    Past Surgical  History: Past Surgical History:  Procedure Laterality Date  . CARDIAC CATHETERIZATION N/A 12/13/2015   Procedure: Right/Left Heart Cath and Coronary/Graft Angiography;  Surgeon: Peter M Swaziland, MD;  [facility];  Service: Cardiovascular;  Laterality: N/A;  . CHOLECYSTECTOMY    . CORONARY ARTERY BYPASS GRAFT  08/13/11   CABG x4 with LIMA to LAD, SVG to Diag, SVG to OM, SVG to PDA, EVH via both thighs  . LAPAROSCOPY  1980's?   "removed gallstones; not gallbladder" (03/17/2013)    Family History:  Family History  Problem Relation Age of Onset  . Heart disease Father   . Hypertension Maternal Grandmother   . Hypertension Sister   . Cancer Neg Hx   .  Diabetes Neg Hx   . Kidney disease Neg Hx     Social History: Social History   Social History  . Marital status: Divorced    Spouse name: N/A  . Number of children: 3  . 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 Smoking status: Former Smoker    Types: Cigarettes    Quit date: 11/11/1978  . Smokeless tobacco: Never Used     Comment: 03/17/2013 "only a social smoker when I did smoke; ever bought any"  . Alcohol use Yes     Comment: OCCASIONAL  . Drug use: No  . Sexual activity: Not Currently    Birth control/ protection: Post-menopausal   Other Topics Concern  . None   Social History Narrative   Lives with her sister and daughter.  Ambulates independently.    Allergies:  Allergies  Allergen Reactions  . Lisinopril Nausea And Vomiting, Other (See Comments) and Cough    Coughing, throwing up, waking up coughing  . Metformin And Related Anaphylaxis    Objective:    Vital Signs:   Temp:  [98.3 F (36.8 C)] 98.3 F (36.8 C) (06/30 1144) Pulse Rate:  [45-100] 99 (06/30 1630) Resp:  [12-20] 19 (06/30 1630) BP: (102-113)/(60-85) 113/82 (06/30 1630) SpO2:  [94 %-100 %] 98 % (06/30 1630) Weight:  [108.6 kg (239 lb 6.7 oz)] 108.6 kg (239 lb 6.7 oz) (06/30  1312)   Filed Weights   05/10/17 1312  Weight: 108.6 kg (239 lb 6.7 oz)     Physical Exam     General:  Ill-appearing. No respiratory difficulty HEENT: Normal Neck: Supple. Thick JVP to ear . Carotids 2+ bilat; no bruits. No lymphadenopathy or thyromegaly appreciated. Cor: PMI non-palpavle. IRR. 2/6 TR Lungs: Decreased BS crackles at bases Abdomen: Markedly obese Soft, nontender, + distended. No hepatosplenomegaly. No bruits or masses. Good bowel sounds. Extremities: No cyanosis, clubbing, rash, 3+ edema Neuro: Alert & oriented x 3, cranial nerves grossly intact. moves all 4 extremities w/o difficulty. Affect flat   Telemetry   AF 90s   EKG   AF 91. RAD. Anterolateral qs. Non-specific ST abnormalities - Personally reviewed   Labs     Basic Metabolic Panel:  Recent Labs Lab 05/10/17 1148  NA 134*  K 4.0  CL 99*  CO2 26  GLUCOSE 259*  BUN 60*  CREATININE 1.29*  CALCIUM 8.5*    Liver Function Tests: No results for input(s): AST, ALT, ALKPHOS, BILITOT, PROT, ALBUMIN in the last 168 hours. No results for input(s): LIPASE, AMYLASE in the last 168 hours. No results for input(s): AMMONIA in the last 168 hours.  CBC:  Recent Labs Lab 05/10/17 1148  WBC 5.8  HGB 14.6  HCT 44.8  MCV 87.0  PLT 250    Cardiac Enzymes: No results for input(s): CKTOTAL, CKMB, CKMBINDEX, TROPONINI in the last 168 hours.  BNP: BNP (last 3 results)  Recent Labs  03/04/17 1013 04/08/17 1623 05/10/17 1148  BNP 1,209.6* 823.3* 669.7*    ProBNP (last 3 results) No results for input(s): PROBNP in the last 8760 hours.   CBG: No results for input(s): GLUCAP in the last 168 hours.  Coagulation Studies: No results for input(s): LABPROT, INR in the last 72 hours.  Other results:  Imaging: Dg Chest 2 View  Result Date: 05/10/2017 CLINICAL DATA:  Patient reports left sided chest pain with SOB X 1 week. HX heart attack,  diabetes EXAM: CHEST  2 VIEW COMPARISON:  03/04/2017  FINDINGS: There stable changes from prior CABG surgery. Cardiac silhouette is mildly enlarged. No mediastinal or hilar masses. Mild vascular congestion with mild interstitial thickening. There is a small left pleural effusion, stable from the prior exam. No pneumothorax. Skeletal structures are grossly intact. IMPRESSION: 1. Findings consistent with mild congestive heart failure with cardiomegaly, vascular congestion and mild interstitial thickening. Small left pleural effusion is stable from the prior exam. Electronically Signed   By: Amie Portlandavid  Ormond M.D.   On: 05/10/2017 12:10      Patient Profile   Christine Knight is a 66 y.o. female w/ PMHx significant for morbid obesity, systolic HF due to ICM (10/12 EF 25%; Echo 6/17 EF 15-20%), CAD with h/o NSTEMI s/p CABG Oct 2012, CKD III, severe pulmonary hypertension, PAF and poorly controlled DM 2 admitted 6/30 with recurrent biventricular HF with massive volume overload and chest pressure  Assessment/Plan   1. A/C Chronic systolic HF due to ICM EF 15-20% Echo 04/14/2016 - She has recurrent biventricular HF with marked volume overload in setting of dietary noncompliance - Will admit to SDU - Will place PICC. Start milrinone, lasix gtt and zaroxolyn 5 bid - No Ace/ARNi/Entresto with CKD.  - Intolerant bidil with difficulty with headaches, dizziness and hypotension.   2. Chest pressure with h/o CAD s/p NSTEMI and CABG 10/12  - Suspect CP due to volume overload. Initial troponin normal. ECG non-acute  - Severe native CAD with patent grafts on LHC 12/13/15.  - Cycle troponins. Continue ASA 81 mg and statin.  3. PAF  - Rate controlled. Start heparin. Likely transition to NOAC - CHA2DS2-VASc Score = 5  4. Morbid obesity - Stressed importance of portion control, as well as fluid and sodium restriction. Needs to increase activity as tolerated.  as   5. Pulmonary HTN - Likely combination of WHO Group II & III  6. CKD stage - Baseline creatinine 1.6. Creatinine  1.3 today  - Watch with diuresis 7. DM2  - Cover with SSI  8. Obstructive sleep apnea: - had sleep study 12/25/16 and has severe obstructive sleep apnea.  - Needs CPAP 9. Gout  - Per PCP 10. Hepatic encephalopathy - continue lactulose and Xifaxin - check ammonias in am 11. Advanced directives - Completed GOLD DNR form 01/2017.  Confirmed DNR/DNI with her and her daughter - Has refused Hospice  Arvilla MeresBensimhon, Ram Haugan, MD 05/10/2017, 4:57 PM  Advanced Heart Failure Team Pager [phone] (M-F; 7a - 4p)  Please contact [facility] for night-coverage after hours (4p -7a ) and weekends on [email or web address]

## 2017-05-10 NOTE — ED Notes (Signed)
Pt. States she does not want nitro. When asked why she states she does not want to start any new medications. Pt. Educated on nitro and how it could help her chest pressure. Pt. Still refusing. EDP made aware.

## 2017-05-10 NOTE — ED Provider Notes (Signed)
PROGRESS NOTE                                                                                                                 This is a sign-out from PA Gekas at shift change: Lum BabeBarbara Perras is a 66 y.o. female presenting with new onset atrial fibrillation with history of CHF (EF 15%) she is reporting a shortness of breath worsening over the course of a week with active chest pain onset last night at 9 PM. Clinically significantly volume overloaded. She's received 90 mg of Lasix. Plan is to give nitroglycerin for active chest pain, consult cardiology for possible heparin-induced see if they've like to admit her primarily.Please refer to previous note for full HPI, ROS, PMH and PE.   Informed by RN the patient is refusing nitroglycerin. Patient seen and evaluated the bedside, states she doesn't want to have any new medication and that she would prefer pain. I've explained to her that this is not only for her comfort but to ease the stress on her heart. She again declines the nitroglycerin. Dr. Milas KocherBensimon will admit.     Kaylyn Limisciotta, Kirat Mezquita, PA-C 05/10/17 1828    Little, Ambrose Finlandachel Morgan, MD 05/11/17 1128

## 2017-05-10 NOTE — ED Notes (Signed)
Pt. Here for SOB x1 week and chest pain that started yesterday. She told her home health nurses about it. Pt. States the CP is to the left and describes it as pressure. Pt. Hx of MI in 2012 with no stents placed. Pt. Reports she has not had any medications today.

## 2017-05-10 NOTE — ED Notes (Signed)
Requested hospital bed 

## 2017-05-10 NOTE — ED Notes (Signed)
IV team at bedside 

## 2017-05-11 ENCOUNTER — Inpatient Hospital Stay (HOSPITAL_COMMUNITY): Payer: Medicare Other

## 2017-05-11 DIAGNOSIS — R609 Edema, unspecified: Secondary | ICD-10-CM

## 2017-05-11 LAB — BASIC METABOLIC PANEL
ANION GAP: 10 (ref 5–15)
BUN: 54 mg/dL — ABNORMAL HIGH (ref 6–20)
CHLORIDE: 98 mmol/L — AB (ref 101–111)
CO2: 26 mmol/L (ref 22–32)
Calcium: 8.3 mg/dL — ABNORMAL LOW (ref 8.9–10.3)
Creatinine, Ser: 1.33 mg/dL — ABNORMAL HIGH (ref 0.44–1.00)
GFR calc Af Amer: 47 mL/min — ABNORMAL LOW (ref >60)
GFR calc non Af Amer: 41 mL/min — ABNORMAL LOW (ref >60)
Glucose, Bld: 244 mg/dL — ABNORMAL HIGH (ref 65–99)
Potassium: 3.6 mmol/L (ref 3.5–5.1)
Sodium: 134 mmol/L — ABNORMAL LOW (ref 135–145)

## 2017-05-11 LAB — CBC
HEMATOCRIT: 46.5 % — AB (ref 36.0–46.0)
HEMOGLOBIN: 15.3 g/dL — AB (ref 12.0–15.0)
MCH: 28.7 pg (ref 26.0–34.0)
MCHC: 32.9 g/dL (ref 30.0–36.0)
MCV: 87.2 fL (ref 78.0–100.0)
Platelets: 228 10*3/uL (ref 150–400)
RBC: 5.33 MIL/uL — ABNORMAL HIGH (ref 3.87–5.11)
RDW: 17.9 % — ABNORMAL HIGH (ref 11.5–15.5)
WBC: 5.5 10*3/uL (ref 4.0–10.5)

## 2017-05-11 LAB — TROPONIN I: TROPONIN I: 0.05 ng/mL — AB (ref <0.03)

## 2017-05-11 LAB — MRSA PCR SCREENING: MRSA by PCR: NEGATIVE

## 2017-05-11 LAB — GLUCOSE, CAPILLARY
GLUCOSE-CAPILLARY: 210 mg/dL — AB (ref 65–99)
Glucose-Capillary: 229 mg/dL — ABNORMAL HIGH (ref 65–99)
Glucose-Capillary: 224 mg/dL — ABNORMAL HIGH (ref 65–99)
Glucose-Capillary: 131 mg/dL — ABNORMAL HIGH (ref 65–99)

## 2017-05-11 LAB — HEPARIN LEVEL (UNFRACTIONATED): Heparin Unfractionated: 0.62 IU/mL (ref 0.30–0.70)

## 2017-05-11 MED ORDER — SODIUM CHLORIDE 0.9% FLUSH
10.0000 mL | Freq: Two times a day (BID) | INTRAVENOUS | Status: DC
  Administered 2017-05-11 – 2017-05-12 (×3): 10 mL
  Administered 2017-05-13: 20 mL
  Administered 2017-05-13 – 2017-05-17 (×9): 10 mL

## 2017-05-11 MED ORDER — FUROSEMIDE 10 MG/ML IJ SOLN
120.0000 mg | Freq: Three times a day (TID) | INTRAMUSCULAR | Status: DC
  Administered 2017-05-11 – 2017-05-12 (×2): 120 mg via INTRAVENOUS
  Filled 2017-05-11 (×4): qty 12

## 2017-05-11 MED ORDER — SODIUM CHLORIDE 0.9% FLUSH: 10.0000 mL | INTRAVENOUS | Status: DC | PRN

## 2017-05-11 MED ORDER — TRAMADOL HCL 50 MG PO TABS
50.0000 mg | ORAL_TABLET | Freq: Two times a day (BID) | ORAL | Status: DC | PRN
  Administered 2017-05-11 – 2017-05-30 (×6): 50 mg via ORAL
  Filled 2017-05-11 (×7): qty 1

## 2017-05-11 NOTE — Progress Notes (Signed)
Right upper arm swollen , with serosanguinous  drainage in minimal amount, dressing applied , wrapped with kerlix, elevated with pillow. Still complaining of pain when moved. NP aware with order.

## 2017-05-11 NOTE — Progress Notes (Signed)
Advanced HF Team Progress Note  Patient Name: Christine Knight Date of Encounter: 05/11/2017  Primary Cardiologist: D. Leshaun Biebel, MD   Subjective   Still massively volume overloaded and dyspneic/orthopneic.  Stable @ rest.  Biggest complaint today is that her right forearm and hand are very swollen.  IV appears to be infiltrated - has been receiving lasix gtt and heparin via this site.  Inpatient Medications    Scheduled Meds: . aspirin EC  81 mg Oral Daily  . atorvastatin  40 mg Oral q1800  . insulin aspart  0-20 Units Subcutaneous TID WC  . insulin aspart  0-5 Units Subcutaneous QHS  . lactulose  30 g Oral BID  . levothyroxine  50 mcg Oral QAC breakfast  . metolazone  5 mg Oral BID  . potassium chloride  20 mEq Oral BID  . pregabalin  100 mg Oral BID  . rifaximin  550 mg Oral BID  . sodium chloride flush  3 mL Intravenous Q12H  . spironolactone  25 mg Oral Daily   Continuous Infusions: . sodium chloride    . furosemide (LASIX) infusion 20 mg/hr (05/11/17 1400)  . heparin 1,200 Units/hr (05/11/17 1100)  . milrinone 0.25 mcg/kg/min (05/11/17 1400)   PRN Meds: sodium chloride, acetaminophen, ondansetron (ZOFRAN) IV, sodium chloride flush, traMADol   Vital Signs    Vitals:   05/11/17 0700 05/11/17 0800 05/11/17 1200 05/11/17 1248  BP: 98/64 99/72  101/66  Pulse: 77 98  98  Resp: 13 16 16  (!) 7  Temp:  97.7 F (36.5 C)  97.5 F (36.4 C)  TempSrc:  Oral  Oral  SpO2: 94% 94% 97% 99%  Weight:      Height:        Intake/Output Summary (Last 24 hours) at 05/11/17 1426 Last data filed at 05/11/17 1400  Gross per 24 hour  Intake           639.81 ml  Output              950 ml  Net          -310.19 ml   Filed Weights   05/10/17 1312 05/11/17 0040 05/11/17 0305  Weight: 239 lb 6.7 oz (108.6 kg) 241 lb 14.4 oz (109.7 kg) 241 lb 13.5 oz (109.7 kg)    Physical Exam   GEN: obese, in no acute distress.  HEENT: Grossly normal.  Neck: Supple, obese, jvp to jaw, no  carotid bruits, or masses. Cardiac: IR, IR, distant.  2+ RLE edema - forearm/hand/bicep with skin breakdown.  Trace LUE edema.  3+ bilat LE edema to hips. No clubbing, cyanosis.  Radials/DP/PT 2+ and equal bilaterally.  Respiratory:  Respirations regular and unlabored, clear to auscultation bilaterally. GI: Soft, nontender, nondistended, BS + x 4. MS: no deformity or atrophy. Skin: warm and dry, no rash. Neuro:  Strength and sensation are intact. Psych: AAOx3.  Normal affect.  Labs    Chemistry Recent Labs Lab 05/10/17 1148 05/11/17 0215  NA 134* 134*  K 4.0 3.6  CL 99* 98*  CO2 26 26  GLUCOSE 259* 244*  BUN 60* 54*  CREATININE 1.29* 1.33*  CALCIUM 8.5* 8.3*  GFRNONAA 42* 41*  GFRAA 49* 47*  ANIONGAP 9 10     Hematology Recent Labs Lab 05/10/17 1148 05/11/17 0215  WBC 5.8 5.5  RBC 5.15* 5.33*  HGB 14.6 15.3*  HCT 44.8 46.5*  MCV 87.0 87.2  MCH 28.3 28.7  MCHC 32.6 32.9  RDW 17.7*  17.9*  PLT 250 228    Cardiac Enzymes Recent Labs Lab 05/11/17 0215  TROPONINI 0.05*    Recent Labs Lab 05/10/17 1220  TROPIPOC 0.04     BNP Recent Labs Lab 05/10/17 1148  BNP 669.7*     Radiology    Dg Chest 2 View  Result Date: 05/10/2017 CLINICAL DATA:  Patient reports left sided chest pain with SOB X 1 week. HX heart attack, diabetes EXAM: CHEST  2 VIEW COMPARISON:  03/04/2017 FINDINGS: There stable changes from prior CABG surgery. Cardiac silhouette is mildly enlarged. No mediastinal or hilar masses. Mild vascular congestion with mild interstitial thickening. There is a small left pleural effusion, stable from the prior exam. No pneumothorax. Skeletal structures are grossly intact. IMPRESSION: 1. Findings consistent with mild congestive heart failure with cardiomegaly, vascular congestion and mild interstitial thickening. Small left pleural effusion is stable from the prior exam. Electronically Signed   By: Amie Portlandavid  Ormond M.D.   On: 05/10/2017 12:10   Telemetry      afib - 90's to low 100's.  PVC's, up to 3 beats nsvt. Personally Reviewed  Patient Profile     Ms.Osentoski is a 66 y.o. female w/ PMHx significant for morbid obesity, systolic HF due to ICM (10/12 EF 25%; Echo 6/17 EF 15-20%), CAD with h/o NSTEMI s/p CABG Oct 2012, CKD III, severe pulmonary hypertension, PAF, dietary noncompliance, and poorly controlled DM 2, admitted 6/30 with acute on chronic HFrEF and wt gain.  Assessment & Plan    1. Acute on chronic HFrEF/ICM:  EF 15-20% 04/14/2016.  Presented 6/30 with marked volume overload and wt gain (up over 30 lbs since 04/16/17) in the setting of eating out frequently and not reporting wts/symptoms.  Placed on lasix gtt w/ zaroxolyn 5 bid and milrinone.  Only minus 310 ml since admission.  Wt actually up 2 lbs.  Remains markedly volume overloaded.  On exam, her LUE/hand is markedly edematous, indicative of IV infiltration (Lasix and heparin infusing on that side).  This explains poor output.  We are awaiting on PICC placement.  Will d/c that site and switch lasix gtt to lasix 120 IV q8h.  D/c heparin.  Will likely plan on elquis after PICC placement.  Cont spiro.  No  blocker/acei/arb/arni due to soft bp's and low output.  2.  RUE IV infiltration:  Marked swelling.  U/S earlier this am was neg for dvt.  IV now d/c'd. Discussed with vascular surgery - will order splint to keep hand/arm above shoulder.  Apply warm compresses.  May need formal vascular eval if skin breakdown, change in sensation, or lack of improvement.  3.  CAD/Chest pressure/elevated troponin:  Mild trop elevation @ 0.05.  No chest pain this am.  Suspect trop elev nonspecific in setting of CHF.  Known to have severe native CAD with patent grafts on cath 12/2015.  Cont asa/statin.  4.  PAF:  Remains in afib - rate reasonable.  With IV infiltration, will plan to initiate eliquis 5 bid, once PICC placed (CHA2DS2VASc = 5).  Will have to consider rhythm mgmt options given CHF, once volume  stable.  5.  CKD III:  Creat up slightly, though not really diuresing yet.  Follow.  6.  DM II:  Cont SSI.  7.  OSA:  Severe OSA on sleep study 12/25/16.  Needs CPAP.  8.  Morbid obesity:  Needs outpt dietary consultation.  Signed, Nicolasa Duckinghristopher Berge, NP  05/11/2017, 2:26 PM    Patient seen and  examined independently. Gilford Raid, NP note reviewed carefully - agree with his assessment and plan. I have edited the note based on my findings.   She remains massively volume overloaded. Unfortunately she has had a severe IV extravasation in R forearm with IV lasix and heparin and has not gotten much lasix. Her right forearm is very tender to touch but sensory and motor systems intact. Good pulses so doubt compartment syndrome. I have spoken with Dr. Magnus Ivan in [facility]. Will elevate the arms and place warm compresses. Watch closely for development of compartment syndrome  Will change lasix to 120 IV q8 and stop heparin until PICC placed (I called PICC nurse personally). Continue milrinone through PIV. Will start Eliquis once forearm hematoma is stable.    Arvilla Meres, MD  3:21 PM

## 2017-05-11 NOTE — Progress Notes (Signed)
Swelling to the right upper ext. Slightly subsided pulses palpable, right fingers and arm are movable, warm to touch. Maintained on 5 pillows elevation, with small blister on right forearm.denied any pain at this time , continue to monitor.

## 2017-05-11 NOTE — Progress Notes (Signed)
ANTICOAGULATION CONSULT NOTE - Follow Up Consult  Pharmacy Consult for heparin Indication: atrial fibrillation  Labs:  Recent Labs  05/10/17 1148 05/11/17 0215  HGB 14.6 15.3*  HCT 44.8 46.5*  PLT 250 228  HEPARINUNFRC  --  0.62  CREATININE 1.29*  --     Assessment/Plan:  66yo female therapeutic on heparin with initial dosing for Afib. Will continue gtt at current rate and confirm stable with additional level.   Vernard GamblesVeronda Desarae Placide, PharmD, BCPS  05/11/2017,3:03 AM

## 2017-05-11 NOTE — Progress Notes (Signed)
Right upper arm circumference-14 inches, right forearm circumference-12 inches, left upper arm circumference -13 inches, left forearm circumference-10 inches.

## 2017-05-11 NOTE — Progress Notes (Signed)
Right arm is more swollen including the  Right hand and fingers, pulses palpable manage to move arm and fingers,  Ivd/ced at once, elevated with 6 pillows, warm compress applied for 20 min. Ultram po given for pain  . MD aware continue to monitor.

## 2017-05-11 NOTE — Progress Notes (Signed)
Peripherally Inserted Central Catheter/Midline Placement  The IV Nurse has discussed with the patient and/or persons authorized to consent for the patient, the purpose of this procedure and the potential benefits and risks involved with this procedure.  The benefits include less needle sticks, lab draws from the catheter, and the patient may be discharged home with the catheter. Risks include, but not limited to, infection, bleeding, blood clot (thrombus formation), and puncture of an artery; nerve damage and irregular heartbeat and possibility to perform a PICC exchange if needed/ordered by physician.  Alternatives to this procedure were also discussed.  Bard Power PICC patient education guide, fact sheet on infection prevention and patient information card has been provided to patient /or left at bedside.    PICC/Midline Placement Documentation        Maximino GreenlandLumban, Smith Mcnicholas Albarece 05/11/2017, 6:39 PM

## 2017-05-11 NOTE — Progress Notes (Signed)
VASCULAR LAB PRELIMINARY  PRELIMINARY  PRELIMINARY  PRELIMINARY  Right upper extremity venous duplex completed.    Preliminary report:  There is no obvious evidence of DVT or SVT noted in the right upper extremity.  Significant interstitial fluid noted throughout right upper extremity.   Evalyse Stroope, RVT 05/11/2017, 11:47 AM

## 2017-05-12 DIAGNOSIS — T801XXA Vascular complications following infusion, transfusion and therapeutic injection, initial encounter: Secondary | ICD-10-CM

## 2017-05-12 LAB — CBC
HEMATOCRIT: 39.2 % (ref 36.0–46.0)
HEMOGLOBIN: 12.5 g/dL (ref 12.0–15.0)
MCH: 27.8 pg (ref 26.0–34.0)
MCHC: 31.9 g/dL (ref 30.0–36.0)
MCV: 87.1 fL (ref 78.0–100.0)
Platelets: 205 10*3/uL (ref 150–400)
RBC: 4.5 MIL/uL (ref 3.87–5.11)
RDW: 17.7 % — ABNORMAL HIGH (ref 11.5–15.5)
WBC: 4.8 10*3/uL (ref 4.0–10.5)

## 2017-05-12 LAB — GLUCOSE, CAPILLARY
GLUCOSE-CAPILLARY: 220 mg/dL — AB (ref 65–99)
GLUCOSE-CAPILLARY: 235 mg/dL — AB (ref 65–99)
GLUCOSE-CAPILLARY: 244 mg/dL — AB (ref 65–99)
Glucose-Capillary: 138 mg/dL — ABNORMAL HIGH (ref 65–99)
Glucose-Capillary: 209 mg/dL — ABNORMAL HIGH (ref 65–99)

## 2017-05-12 LAB — BASIC METABOLIC PANEL
ANION GAP: 6 (ref 5–15)
BUN: 46 mg/dL — ABNORMAL HIGH (ref 6–20)
CALCIUM: 7.9 mg/dL — AB (ref 8.9–10.3)
CO2: 30 mmol/L (ref 22–32)
Chloride: 97 mmol/L — ABNORMAL LOW (ref 101–111)
Creatinine, Ser: 1.12 mg/dL — ABNORMAL HIGH (ref 0.44–1.00)
GFR calc Af Amer: 58 mL/min — ABNORMAL LOW (ref >60)
GFR, EST NON AFRICAN AMERICAN: 50 mL/min — AB (ref >60)
GLUCOSE: 164 mg/dL — AB (ref 65–99)
Potassium: 3.3 mmol/L — ABNORMAL LOW (ref 3.5–5.1)
Sodium: 133 mmol/L — ABNORMAL LOW (ref 135–145)

## 2017-05-12 LAB — HEMOGLOBIN A1C
Hgb A1c MFr Bld: 8.1 % — ABNORMAL HIGH (ref 4.8–5.6)
MEAN PLASMA GLUCOSE: 186 mg/dL

## 2017-05-12 LAB — MAGNESIUM: Magnesium: 1.7 mg/dL (ref 1.7–2.4)

## 2017-05-12 LAB — COOXEMETRY PANEL
CARBOXYHEMOGLOBIN: 1.9 % — AB (ref 0.5–1.5)
Methemoglobin: 0.6 % (ref 0.0–1.5)
O2 Saturation: 61.7 %
Total hemoglobin: 13.2 g/dL (ref 12.0–16.0)

## 2017-05-12 MED ORDER — APIXABAN 5 MG PO TABS
5.0000 mg | ORAL_TABLET | Freq: Two times a day (BID) | ORAL | Status: DC
Start: 2017-05-12 — End: 2017-05-30
  Administered 2017-05-12 – 2017-05-30 (×37): 5 mg via ORAL
  Filled 2017-05-12 (×38): qty 1

## 2017-05-12 MED ORDER — AMIODARONE LOAD VIA INFUSION
150.0000 mg | Freq: Once | INTRAVENOUS | Status: AC
  Administered 2017-05-12: 150 mg via INTRAVENOUS
  Filled 2017-05-12: qty 83.34

## 2017-05-12 MED ORDER — AMIODARONE HCL IN DEXTROSE 360-4.14 MG/200ML-% IV SOLN
60.0000 mg/h | INTRAVENOUS | Status: AC
  Administered 2017-05-12 (×2): 60 mg/h via INTRAVENOUS
  Filled 2017-05-12 (×2): qty 200

## 2017-05-12 MED ORDER — AMIODARONE HCL IN DEXTROSE 360-4.14 MG/200ML-% IV SOLN: 30.0000 mg/h | INTRAVENOUS | Status: DC

## 2017-05-12 MED ORDER — MAGNESIUM SULFATE 2 GM/50ML IV SOLN
2.0000 g | Freq: Once | INTRAVENOUS | Status: AC
  Administered 2017-05-12: 2 g via INTRAVENOUS
  Filled 2017-05-12: qty 50

## 2017-05-12 MED ORDER — FUROSEMIDE 10 MG/ML IJ SOLN
30.0000 mg/h | INTRAVENOUS | Status: AC
  Administered 2017-05-12 – 2017-05-28 (×36): 30 mg/h via INTRAVENOUS
  Filled 2017-05-12: qty 21
  Filled 2017-05-12: qty 20
  Filled 2017-05-12 (×13): qty 25
  Filled 2017-05-12: qty 2
  Filled 2017-05-12 (×26): qty 25
  Filled 2017-05-12: qty 20
  Filled 2017-05-12 (×6): qty 25
  Filled 2017-05-12: qty 21
  Filled 2017-05-12 (×26): qty 25
  Filled 2017-05-12: qty 20
  Filled 2017-05-12 (×2): qty 25
  Filled 2017-05-12: qty 20
  Filled 2017-05-12: qty 21
  Filled 2017-05-12 (×3): qty 25

## 2017-05-12 MED ORDER — POTASSIUM CHLORIDE CRYS ER 20 MEQ PO TBCR
40.0000 meq | EXTENDED_RELEASE_TABLET | Freq: Three times a day (TID) | ORAL | Status: DC
  Administered 2017-05-12 – 2017-05-28 (×51): 40 meq via ORAL
  Filled 2017-05-12 (×51): qty 2

## 2017-05-12 NOTE — Progress Notes (Signed)
Advanced HF Team Progress Note  Patient Name: Christine Knight Date of Encounter: 05/12/2017  Primary Cardiologist: D. Jackie Russman, MD   Subjective   Yesterday she had RUE IV infiltrate. IV removed. PICC placed on left. Diuresing with 120 mg IV lasix every 8 hours + metolazone 5 mg twice a day. Negative 2.8 liters.    Complaining of RUE pain. Denies SOB.   Inpatient Medications    Scheduled Meds: . aspirin EC  81 mg Oral Daily  . atorvastatin  40 mg Oral q1800  . insulin aspart  0-20 Units Subcutaneous TID WC  . insulin aspart  0-5 Units Subcutaneous QHS  . lactulose  30 g Oral BID  . levothyroxine  50 mcg Oral QAC breakfast  . metolazone  5 mg Oral BID  . potassium chloride  20 mEq Oral BID  . pregabalin  100 mg Oral BID  . rifaximin  550 mg Oral BID  . sodium chloride flush  10-40 mL Intracatheter Q12H  . sodium chloride flush  3 mL Intravenous Q12H  . spironolactone  25 mg Oral Daily   Continuous Infusions: . sodium chloride    . furosemide Stopped (05/12/17 0000)  . milrinone 0.375 mcg/kg/min (05/12/17 0700)   PRN Meds: sodium chloride, acetaminophen, ondansetron (ZOFRAN) IV, sodium chloride flush, sodium chloride flush, traMADol   Vital Signs    Vitals:   05/11/17 2344 05/12/17 0334 05/12/17 0719 05/12/17 0753  BP: 108/68 119/79  91/60  Pulse: 86 (!) 101 (!) 106   Resp: 13 14 16 15   Temp: 98 F (36.7 C) 97.7 F (36.5 C) 98.1 F (36.7 C)   TempSrc: Oral Oral Oral   SpO2: 96% 97% 95%   Weight:  244 lb 11.4 oz (111 kg)    Height:        Intake/Output Summary (Last 24 hours) at 05/12/17 0849 Last data filed at 05/12/17 0700  Gross per 24 hour  Intake           999.58 ml  Output             3500 ml  Net         -2500.42 ml   Filed Weights   05/11/17 0040 05/11/17 0305 05/12/17 0334  Weight: 241 lb 14.4 oz (109.7 kg) 241 lb 13.5 oz (109.7 kg) 244 lb 11.4 oz (111 kg)    Physical Exam  CVP 20 General:  Chronically ill appearing. No resp difficulty.  In bed.  HEENT: normal Neck: supple. JVP to jaw Carotids 2+ bilat; no bruits. No lymphadenopathy or thryomegaly appreciated. Cor: PMI nondisplaced. Irregular rate & rhythm. No rubs, gallops or murmurs. Lungs: clear Abdomen: obese. soft, nontender, nondistended. No hepatosplenomegaly. No bruits or masses. Good bowel sounds. Extremities: no cyanosis, clubbing, rash, R and LLE 2+edema. LUE PICC. RUE tender, edema with small blister noted. R radial pulse 3+  Neuro: alert & orientedx3, cranial nerves grossly intact. moves all 4 extremities w/o difficulty. Affect pleasant  Labs    Chemistry  Recent Labs Lab 05/10/17 1148 05/11/17 0215 05/12/17 0500  NA 134* 134* 133*  K 4.0 3.6 3.3*  CL 99* 98* 97*  CO2 26 26 30   GLUCOSE 259* 244* 164*  BUN 60* 54* 46*  CREATININE 1.29* 1.33* 1.12*  CALCIUM 8.5* 8.3* 7.9*  GFRNONAA 42* 41* 50*  GFRAA 49* 47* 58*  ANIONGAP 9 10 6      Hematology  Recent Labs Lab 05/10/17 1148 05/11/17 0215 05/12/17 0500  WBC 5.8 5.5 4.8  RBC 5.15* 5.33* 4.50  HGB 14.6 15.3* 12.5  HCT 44.8 46.5* 39.2  MCV 87.0 87.2 87.1  MCH 28.3 28.7 27.8  MCHC 32.6 32.9 31.9  RDW 17.7* 17.9* 17.7*  PLT 250 228 205    Cardiac Enzymes  Recent Labs Lab 05/11/17 0215  TROPONINI 0.05*     Recent Labs Lab 05/10/17 1220  TROPIPOC 0.04     BNP  Recent Labs Lab 05/10/17 1148  BNP 669.7*     Radiology    Dg Chest 2 View  Result Date: 05/10/2017 CLINICAL DATA:  Patient reports left sided chest pain with SOB X 1 week. HX heart attack, diabetes EXAM: CHEST  2 VIEW COMPARISON:  03/04/2017 FINDINGS: There stable changes from prior CABG surgery. Cardiac silhouette is mildly enlarged. No mediastinal or hilar masses. Mild vascular congestion with mild interstitial thickening. There is a small left pleural effusion, stable from the prior exam. No pneumothorax. Skeletal structures are grossly intact. IMPRESSION: 1. Findings consistent with mild congestive heart failure  with cardiomegaly, vascular congestion and mild interstitial thickening. Small left pleural effusion is stable from the prior exam. Electronically Signed   By: Amie Portland M.D.   On: 05/10/2017 12:10   Dg Chest Port 1 View  Result Date: 05/11/2017 CLINICAL DATA:  66 year old female status post line placement. EXAM: PORTABLE CHEST 1 VIEW COMPARISON:  Chest radiographs 05/10/2017 and earlier. FINDINGS: Portable AP semi upright view at 1852 hours. Left side PICC line now in place. Tip projects at the cavoatrial junction level. Stable cardiomegaly and mediastinal contours. Prior CABG. Continued left lung base opacity compatible with pleural effusion and atelectasis. Stable pulmonary vascularity. No new pulmonary opacity. IMPRESSION: 1. Left PICC line placed, tip at the cavoatrial junction level. 2. Otherwise stable chest with small left pleural effusion and interstitial edema. Electronically Signed   By: Odessa Fleming M.D.   On: 05/11/2017 19:09   Telemetry      A fib 110-120 PVCs. Personallly reviewed.   Patient Profile     Christine Knight is a 65 y.o. female w/ PMHx significant for morbid obesity, systolic HF due to ICM (10/12 EF 25%; Echo 6/17 EF 15-20%), CAD with h/o NSTEMI s/p CABG Oct 2012, CKD III, severe pulmonary hypertension, PAF, dietary noncompliance, and poorly controlled DM 2, admitted 6/30 with acute on chronic HFrEF and wt gain.  Assessment & Plan    1. Acute on chronic HFrEF/ICM:  EF 15-20% 04/14/2016.  Presented 6/30 with marked volume overload and wt gain (up over 30 lbs since 04/16/17) in the setting of eating out frequently and not reporting wts/symptoms.   CVP ~20. Remains on milrinone 0.375 mcg. Brisk diuresis noted. Negative 2.8 liters. Weights not accurate. Bed weight and not standing.  Add ted hose.   Cont spiro.  No  blocker/acei/arb/arni due to soft bp's and low output. Continue 120 mg IV lasix every 8 hours +metolazone 5 mg twice a day.  Increase potassium to 40 meq three times a day.    2.  RUE IV infiltration:  7/1 [place] was neg for dvt.   Keep RUE elevated. Edema improved. Continue warm  Compresses. Hold off on vascular surgery consult. Will need to see if RUE evolves with tissue loss. This will be evident if  Eschar develops or new blisters.  Decreased swelling today. Palpable radial pulse on right with intact sensation.   3.  CAD/Chest pressure/elevated troponin:  Mild trop elevation @ 0.05.  Suspect trop elev nonspecific in setting of CHF.  Known  to have severe native CAD with patent grafts on cath 12/2015.  Cont asa/statin. No chest pain today.   4.  PAF:  Remains in afib - Rate 110-120s. Continue eliquis 5 mg twice a day.  (CHA2DS2VASc = 5). Add amio drip.   5.  CKD III:  Creatinine stable.   6.  DM II:  Cont SSI. Glucose stable.   7.  OSA:  Severe OSA on sleep study 12/25/16.  Needs CPAP.  8.  Morbid obesity:  Needs outpt dietary consultation.  9. PVCs.- check mag. Add amio as above.   SignedTonye Becket, [name], NP  05/12/2017, 8:49 AM    Patient seen and examined with Tonye BecketAmy Clegg, NP. We discussed all aspects of the encounter. I agree with the assessment and plan as stated above.   Remains markedly fluid overloaded. Poor response to lasix 120IV tid. Will switch to lasix drip. Continue milrinone for RV support.   AF rate up today. Starting IV amio.   R arm IV infiltrate still swollen but improved. No evidence fo compartment syndrome. Keep elevated. + warm compresses.   Arvilla MeresBensimhon, Tyris Eliot, MD  1:53 PM

## 2017-05-12 NOTE — Progress Notes (Signed)
MD notified of pt's c/o of feeling "not well" and very tired.  MD ordered to stop amio gtt at this time.  Will continue to monitor closely and update as needed.

## 2017-05-12 NOTE — Care Management Note (Signed)
Case Management Note  Patient Details  Name: Lum BabeBarbara Arias MRN: [id number] Date of Birth: 1950-12-24  Subjective/Objective:    Pt admitted with HF                Action/Plan:   PTA from home.  Pt is already active with para medicine clinic.  CM submitted benefit check for eliquis   Expected Discharge Date:                  Expected Discharge Plan:  Home w Home Health Services  In-House Referral:     Discharge planning Services  CM Consult  Post Acute Care Choice:    Choice offered to:     DME Arranged:    DME Agency:     HH Arranged:    HH Agency:     Status of Service:     If discussed at [employer] Meetings, dates discussed:    Additional Comments:  Cherylann ParrClaxton, Iylah Dworkin S, RN 05/12/2017, 4:01 PM

## 2017-05-12 NOTE — Discharge Instructions (Signed)

## 2017-05-12 NOTE — Progress Notes (Signed)
Results for [name], [place] (MRN [id number]) as of 05/12/2017 14:01  Ref. Range 05/11/2017 12:45 05/11/2017 16:44 05/11/2017 22:22 05/12/2017 07:43 05/12/2017 12:16  Glucose-Capillary Latest Ref Range: 65 - 99 mg/dL 409210 (H) 811224 (H) 914131 (H) 138 (H) 220 (H)  Noted that postprandial blood sugars have been greater than 180 mg/dl.  If blood sugars continue to be elevated, recommend adding Novolog 5 units TID with meals if patient eats at least 50 % of meals.  May need oral agents when discharged and to see PCP for followup.    Smith MinceKendra Afra Tricarico RN BSN CDE Diabetes Coordinator Pager: [phone]  8am-5pm

## 2017-05-13 ENCOUNTER — Encounter (HOSPITAL_COMMUNITY): Payer: Medicare Other

## 2017-05-13 LAB — GLUCOSE, CAPILLARY
GLUCOSE-CAPILLARY: 192 mg/dL — AB (ref 65–99)
GLUCOSE-CAPILLARY: 210 mg/dL — AB (ref 65–99)
Glucose-Capillary: 186 mg/dL — ABNORMAL HIGH (ref 65–99)
Glucose-Capillary: 194 mg/dL — ABNORMAL HIGH (ref 65–99)

## 2017-05-13 LAB — BASIC METABOLIC PANEL
Anion gap: 8 (ref 5–15)
BUN: 48 mg/dL — ABNORMAL HIGH (ref 6–20)
CHLORIDE: 90 mmol/L — AB (ref 101–111)
CO2: 27 mmol/L (ref 22–32)
CREATININE: 1.28 mg/dL — AB (ref 0.44–1.00)
Calcium: 8.2 mg/dL — ABNORMAL LOW (ref 8.9–10.3)
GFR calc non Af Amer: 43 mL/min — ABNORMAL LOW (ref >60)
GFR, EST AFRICAN AMERICAN: 50 mL/min — AB (ref >60)
Glucose, Bld: 295 mg/dL — ABNORMAL HIGH (ref 65–99)
POTASSIUM: 4.1 mmol/L (ref 3.5–5.1)
SODIUM: 125 mmol/L — AB (ref 135–145)

## 2017-05-13 LAB — CBC
HCT: 36.6 % (ref 36.0–46.0)
Hemoglobin: 11.8 g/dL — ABNORMAL LOW (ref 12.0–15.0)
MCH: 28.1 pg (ref 26.0–34.0)
MCHC: 32.2 g/dL (ref 30.0–36.0)
MCV: 87.1 fL (ref 78.0–100.0)
PLATELETS: 203 10*3/uL (ref 150–400)
RBC: 4.2 MIL/uL (ref 3.87–5.11)
RDW: 17.6 % — AB (ref 11.5–15.5)
WBC: 4.7 10*3/uL (ref 4.0–10.5)

## 2017-05-13 LAB — COOXEMETRY PANEL
Carboxyhemoglobin: 2 % — ABNORMAL HIGH (ref 0.5–1.5)
METHEMOGLOBIN: 0.6 % (ref 0.0–1.5)
O2 SAT: 73.3 %
TOTAL HEMOGLOBIN: 13 g/dL (ref 12.0–16.0)

## 2017-05-13 LAB — AMMONIA: AMMONIA: 61 umol/L — AB (ref 9–35)

## 2017-05-13 LAB — MAGNESIUM: MAGNESIUM: 2.1 mg/dL (ref 1.7–2.4)

## 2017-05-13 MED ORDER — TOLVAPTAN 15 MG PO TABS
15.0000 mg | ORAL_TABLET | Freq: Once | ORAL | Status: AC
  Administered 2017-05-13: 15 mg via ORAL
  Filled 2017-05-13: qty 1

## 2017-05-13 MED ORDER — MAGNESIUM HYDROXIDE 400 MG/5ML PO SUSP
30.0000 mL | Freq: Every day | ORAL | Status: DC | PRN
  Administered 2017-05-13 – 2017-05-19 (×3): 30 mL via ORAL
  Filled 2017-05-13 (×3): qty 30

## 2017-05-13 NOTE — Care Management Note (Signed)
Case Management Note  Patient Details  Name: Lum BabeBarbara Earlywine MRN: [id number] Date of Birth: 1951/05/24  Subjective/Objective:    Pt admitted with HF                Action/Plan:   PTA from home.  Pt is already active with para medicine clinic.  CM submitted benefit check for eliquis   Expected Discharge Date:                  Expected Discharge Plan:  Home w Home Health Services  In-House Referral:     Discharge planning Services  CM Consult  Post Acute Care Choice:    Choice offered to:     DME Arranged:    DME Agency:     HH Arranged:    HH Agency:     Status of Service:     If discussed at [employer] Meetings, dates discussed:    Additional Comments: CM confirmed with [facility] that prescription can be filled at discharge within normal operation hours  CM provided pt eliquis free 30 day card - benefit check submitted - pt has $0 copay and prior auth is not needed.  Pt states she uses [facility] and knows to get medication from [facility] on [address] if she discharges after 6pm during the week or on the weekend.  Pt is independent at home and states she is independently walking in room. Pt educated on daily weights and low salt diet restrictions.   Cherylann ParrClaxton, Ayrianna Mcginniss S, RN 05/13/2017, 11:54 AM

## 2017-05-13 NOTE — Progress Notes (Signed)
Advanced HF Team Progress Note  Patient Name: Christine Knight Date of Encounter: 05/13/2017  Primary Cardiologist: D. Shulamis Wenberg, MD   Subjective   On 7/1/  RUE IV infiltrate.   Yesterday started on lasix drip at 30 mg per hour + metolazone 5 mg twice a day. Sluggish urine output. Sodium trending down.   Says RUE feeling better. Complaining of fatigue.   Inpatient Medications    Scheduled Meds: . apixaban  5 mg Oral BID  . aspirin EC  81 mg Oral Daily  . atorvastatin  40 mg Oral q1800  . insulin aspart  0-20 Units Subcutaneous TID WC  . insulin aspart  0-5 Units Subcutaneous QHS  . lactulose  30 g Oral BID  . levothyroxine  50 mcg Oral QAC breakfast  . metolazone  5 mg Oral BID  . potassium chloride  40 mEq Oral TID  . pregabalin  100 mg Oral BID  . rifaximin  550 mg Oral BID  . sodium chloride flush  10-40 mL Intracatheter Q12H  . sodium chloride flush  3 mL Intravenous Q12H  . spironolactone  25 mg Oral Daily   Continuous Infusions: . sodium chloride    . amiodarone Stopped (05/12/17 1900)  . furosemide (LASIX) infusion 30 mg/hr (05/13/17 0300)  . milrinone 0.375 mcg/kg/min (05/13/17 0655)   PRN Meds: sodium chloride, acetaminophen, ondansetron (ZOFRAN) IV, sodium chloride flush, sodium chloride flush, traMADol   Vital Signs    Vitals:   05/13/17 0012 05/13/17 0300 05/13/17 0322 05/13/17 0608  BP: 125/68  (!) 105/42   Pulse: 79  85   Resp: 11 10 10 12   Temp: 97.4 F (36.3 C)  97.7 F (36.5 C)   TempSrc: Oral  Oral   SpO2: 97%  97%   Weight:   243 lb 14.4 oz (110.6 kg) 234 lb 11.2 oz (106.5 kg)  Height:        Intake/Output Summary (Last 24 hours) at 05/13/17 0811 Last data filed at 05/13/17 0600  Gross per 24 hour  Intake          1426.81 ml  Output             2150 ml  Net          -723.19 ml   Filed Weights   05/12/17 1600 05/13/17 0322 05/13/17 0608  Weight: 241 lb 6.5 oz (109.5 kg) 243 lb 14.4 oz (110.6 kg) 234 lb 11.2 oz (106.5 kg)     Physical Exam  CVP 24 personally checked.  General:  Appears fatigued. No resp difficulty HEENT: normal Neck: supple. JVP to ear. Carotids 2+ bilat; no bruits. No lymphadenopathy or thryomegaly appreciated. Cor: PMI nondisplaced. Irregular rate & rhythm. No rubs, gallops or murmurs. Lungs: Decreased to in the bases.  Abdomen: soft, nontender, nondistended. No hepatosplenomegaly. No bruits or masses. Good bowel sounds. Extremities: no cyanosis, clubbing, rash, R and LLE 3+ edema + ted hose. LUE PICC. RUE ecchymotic forearm with small blister noted.  Neuro: alert & orientedx3, cranial nerves grossly intact. moves all 4 extremities w/o difficulty. Affect pleasant   Labs    Chemistry  Recent Labs Lab 05/11/17 0215 05/12/17 0500 05/13/17 0442  NA 134* 133* 125*  K 3.6 3.3* 4.1  CL 98* 97* 90*  CO2 26 30 27   GLUCOSE 244* 164* 295*  BUN 54* 46* 48*  CREATININE 1.33* 1.12* 1.28*  CALCIUM 8.3* 7.9* 8.2*  GFRNONAA 41* 50* 43*  GFRAA 47* 58* 50*  ANIONGAP 10  6 8     Hematology  Recent Labs Lab 05/11/17 0215 05/12/17 0500 05/13/17 0442  WBC 5.5 4.8 4.7  RBC 5.33* 4.50 4.20  HGB 15.3* 12.5 11.8*  HCT 46.5* 39.2 36.6  MCV 87.2 87.1 87.1  MCH 28.7 27.8 28.1  MCHC 32.9 31.9 32.2  RDW 17.9* 17.7* 17.6*  PLT 228 205 203    Cardiac Enzymes  Recent Labs Lab 05/11/17 0215  TROPONINI 0.05*     Recent Labs Lab 05/10/17 1220  TROPIPOC 0.04     BNP  Recent Labs Lab 05/10/17 1148  BNP 669.7*     Radiology    Dg Chest Port 1 View  Result Date: 05/11/2017 CLINICAL DATA:  66 year old female status post line placement. EXAM: PORTABLE CHEST 1 VIEW COMPARISON:  Chest radiographs 05/10/2017 and earlier. FINDINGS: Portable AP semi upright view at 1852 hours. Left side PICC line now in place. Tip projects at the cavoatrial junction level. Stable cardiomegaly and mediastinal contours. Prior CABG. Continued left lung base opacity compatible with pleural effusion and  atelectasis. Stable pulmonary vascularity. No new pulmonary opacity. IMPRESSION: 1. Left PICC line placed, tip at the cavoatrial junction level. 2. Otherwise stable chest with small left pleural effusion and interstitial edema. Electronically Signed   By: Christine Knight M.D.   On: 05/11/2017 19:09   Telemetry      A fib 80-90s personally reviewed. .   Patient Profile     Christine Knight is a 66 y.o. female w/ PMHx significant for morbid obesity, systolic HF due to ICM (10/12 EF 25%; Echo 6/17 EF 15-20%), CAD with h/o NSTEMI s/p CABG Oct 2012, CKD III, severe pulmonary hypertension, PAF, dietary noncompliance, and poorly controlled DM 2, admitted 6/30 with acute on chronic HFrEF and wt gain.  Assessment & Plan    1. Acute on chronic HFrEF/ICM:  EF 15-20% 04/14/2016.  Presented 6/30 with marked volume overload and wt gain (up over 30 lbs since 04/16/17) in the setting of eating out frequently and not reporting wts/symptoms.   CVP up to 24.   Remains on milrinone 0.375 mcg. On lasix drip at 30 mg per hour. In the past we have used dopamine 1.5 mcg to assist with diuresis. I will discuss with Christine Knight.   Cont spiro.  No  blocker/acei/arb/arni due to soft bp's and low output. 2.  RUE IV infiltration:  7/1 [place] was neg for dvt.  Improving. Decreased edema. Remains ecchymotic.  Continue to elevated and use  warm compresses. Palpable radial pulse on right with intact sensation.   3.  CAD/Chest pressure/elevated troponin:  Mild trop elevation @ 0.05.  Suspect trop elev nonspecific in setting of CHF.  Known to have severe native CAD with patent grafts on cath 12/2015.  Cont asa/statin. No chest pain.    4.  PAF:  Remains in afib - Rate controlled today. Continue eliquis 5 mg twice a day.  (CHA2DS2VASc = 5). Stopped amio last night.    5.  CKD III:  Creatinine trending up 1.1>1.2   6.  DM II:  Cont SSI. Glucose stable.   7.  OSA:  Severe OSA on sleep study 12/25/16.  Needs CPAP.  8.  Morbid obesity: Body mass  index is 37.88 kg/m.  Needs outpt dietary consultation.  9. PVCs.- check mag. Off amio Add amio as above.   10. Hyponatremia: Sodium trending down 133>125. Give dose of tolvaptan 15 mg daily  11. DNR  Signed, Christine Becket, NP  05/13/2017, 8:11 AM  Patient seen and examined with Christine BecketAmy Clegg, NP. We discussed all aspects of the encounter. I agree with the assessment and plan as stated above.   Diuresis remains sluggish. CVP very high.  Will continue milrinone, high-dose lasix drip and metolazone. Give one dose tolvaptan in setting of progressive hyponatremia. Watch renal function closely. Can add dopamine as needed but would like to avoid with AF.   Feels better off amio. Remains in AF but rate controlled.   IV infiltration site improving. Continue elevation and warm compresses.   Arvilla MeresBensimhon, Reola Buckles, MD  4:58 PM

## 2017-05-13 NOTE — Evaluation (Signed)
Physical Therapy Evaluation Patient Details Name: Christine Knight MRN: [id number] DOB: 1951-07-02 Today's Date: 05/13/2017   History of Present Illness  Patient is a 66 y/o female who presents with heart failure and volume overload. CXR-Small left pleural effusion. Recently admitted 4/24-5/3 for fluid overload. PMH includes morbid obesity, systolic HF due to ICM, CAD with hx of NSTEMI s/p CABG Oct 2012, pulmonary HTN, DM.  Clinical Impression  Patient presents with dyspnea on exertion, decreased endurance, RUE swelling and impaired mobility s/p above. Tolerated gait training with supervision for safety. Pt in A-fib with HR up to 130s bpm. Encouraged AROM RUE. Encouraged to use sling for mobility per RN. Pt lives alone PTA and independent. Encouraged ambulation a few times per day with RN. Will follow acutely to maximize independence and mobility prior to return home.     Follow Up Recommendations No PT follow up;Supervision for mobility/OOB    Equipment Recommendations  None recommended by PT    Recommendations for Other Services OT consult     Precautions / Restrictions Precautions Precautions: Fall Precaution Comments: RUE IV infiltrate Required Braces or Orthoses: Sling Restrictions Weight Bearing Restrictions: No      Mobility  Bed Mobility               General bed mobility comments: Sitting in chair upon PT arrival.   Transfers Overall transfer level: Needs assistance Equipment used: None Transfers: Sit to/from Stand Sit to Stand: Min guard         General transfer comment: Min guard for safety. Stood from low chair, increased time and some difficulty.   Ambulation/Gait Ambulation/Gait assistance: Supervision Ambulation Distance (Feet): 250 Feet Assistive device: None Gait Pattern/deviations: Step-through pattern;Decreased stride length;Drifts right/left Gait velocity: decreased   General Gait Details: Slow, mostly steady gait with 3/4 DOE. A-fib 130s  bpm during mobility. 2 standing rest breaks.  Stairs            Wheelchair Mobility    Modified Rankin (Stroke Patients Only)       Balance Overall balance assessment: Needs assistance Sitting-balance support: Feet supported;No upper extremity supported Sitting balance-[name] Scale: Good     Standing balance support: During functional activity Standing balance-[name] Scale: Fair                               Pertinent Vitals/Pain Pain Assessment: Faces Faces Pain Scale: Hurts little more Pain Location: RUE Pain Descriptors / Indicators: Sore;Aching Pain Intervention(s): Monitored during session;Repositioned    Home Living Family/patient expects to be discharged to:: Private residence Living Arrangements: Alone Available Help at Discharge: Family;Friend(s);Available 24 hours/day Type of Home: House Home Access: Level entry     Home Layout: One level Home Equipment: Grab bars - tub/shower      Prior Function Level of Independence: Independent         Comments: drives     Hand Dominance   Dominant Hand: Right    Extremity/Trunk Assessment   Upper Extremity Assessment Upper Extremity Assessment: Defer to OT evaluation    Lower Extremity Assessment Lower Extremity Assessment: Generalized weakness (but functional.)       Communication   Communication: No difficulties  Cognition Arousal/Alertness: Awake/alert Behavior During Therapy: WFL for tasks assessed/performed Overall Cognitive Status: Within Functional Limits for tasks assessed  General Comments General comments (skin integrity, edema, etc.): BP 98/69 pre activity; 121/80 post activity    Exercises     Assessment/Plan    PT Assessment Patient needs continued PT services  PT Problem List Decreased strength;Decreased mobility;Pain;Decreased balance;Cardiopulmonary status limiting activity;Decreased activity  tolerance       PT Treatment Interventions Therapeutic activities;Gait training;Therapeutic exercise;Patient/family education;Balance training;Functional mobility training    PT Goals (Current goals can be found in the Care Plan section)  Acute Rehab PT Goals Patient Stated Goal: to go shopping with my daughter and be able to walk out of here PT Goal Formulation: With patient Time For Goal Achievement: 05/27/17 Potential to Achieve Goals: Good    Frequency Min 3X/week   Barriers to discharge Decreased caregiver support lives alone    Co-evaluation               AM-PAC PT "6 Clicks" Daily Activity  Outcome Measure Difficulty turning over in bed (including adjusting bedclothes, sheets and blankets)?: None Difficulty moving from lying on back to sitting on the side of the bed? : None Difficulty sitting down on and standing up from a chair with arms (e.g., wheelchair, bedside commode, etc,.)?: None Help needed moving to and from a bed to chair (including a wheelchair)?: None Help needed walking in hospital room?: None Help needed climbing 3-5 steps with a railing? : A Little 6 Click Score: 23    End of Session   Activity Tolerance: Patient tolerated treatment well Patient left: in chair;with call bell/phone within reach Nurse Communication: Mobility status PT Visit Diagnosis: Other (comment);Unsteadiness on feet (R26.81) (DOE)    Time: 7829-5621: 1135-1153 PT Time Calculation (min) (ACUTE ONLY): 18 min   Charges:   PT Evaluation $PT Eval Low Complexity: 1 Procedure     PT G Codes:        Mylo RedShauna Bernie Ransford, PT, DPT [phone]    Blake DivineShauna A Ercel Pepitone 05/13/2017, 12:00 PM

## 2017-05-14 LAB — BASIC METABOLIC PANEL
ANION GAP: 6 (ref 5–15)
BUN: 46 mg/dL — ABNORMAL HIGH (ref 6–20)
CALCIUM: 8.2 mg/dL — AB (ref 8.9–10.3)
CO2: 29 mmol/L (ref 22–32)
Chloride: 90 mmol/L — ABNORMAL LOW (ref 101–111)
Creatinine, Ser: 1.32 mg/dL — ABNORMAL HIGH (ref 0.44–1.00)
GFR calc non Af Amer: 41 mL/min — ABNORMAL LOW (ref >60)
GFR, EST AFRICAN AMERICAN: 48 mL/min — AB (ref >60)
Glucose, Bld: 378 mg/dL — ABNORMAL HIGH (ref 65–99)
POTASSIUM: 4.5 mmol/L (ref 3.5–5.1)
SODIUM: 125 mmol/L — AB (ref 135–145)

## 2017-05-14 LAB — CBC
HCT: 36.2 % (ref 36.0–46.0)
Hemoglobin: 11.4 g/dL — ABNORMAL LOW (ref 12.0–15.0)
MCH: 27.5 pg (ref 26.0–34.0)
MCHC: 31.5 g/dL (ref 30.0–36.0)
MCV: 87.4 fL (ref 78.0–100.0)
Platelets: 204 10*3/uL (ref 150–400)
RBC: 4.14 MIL/uL (ref 3.87–5.11)
RDW: 17.6 % — AB (ref 11.5–15.5)
WBC: 4.2 10*3/uL (ref 4.0–10.5)

## 2017-05-14 LAB — GLUCOSE, CAPILLARY
GLUCOSE-CAPILLARY: 209 mg/dL — AB (ref 65–99)
GLUCOSE-CAPILLARY: 203 mg/dL — AB (ref 65–99)
GLUCOSE-CAPILLARY: 191 mg/dL — AB (ref 65–99)
Glucose-Capillary: 154 mg/dL — ABNORMAL HIGH (ref 65–99)

## 2017-05-14 LAB — COOXEMETRY PANEL
CARBOXYHEMOGLOBIN: 1.5 % (ref 0.5–1.5)
METHEMOGLOBIN: 0.9 % (ref 0.0–1.5)
O2 Saturation: 71.9 %
TOTAL HEMOGLOBIN: 12 g/dL (ref 12.0–16.0)

## 2017-05-14 MED ORDER — INSULIN ASPART 100 UNIT/ML ~~LOC~~ SOLN
5.0000 [IU] | Freq: Three times a day (TID) | SUBCUTANEOUS | Status: DC
  Administered 2017-05-14 – 2017-05-29 (×29): 5 [IU] via SUBCUTANEOUS
  Filled 2017-05-14 (×6): qty 0.05

## 2017-05-14 NOTE — Progress Notes (Addendum)
Advanced HF Team Progress Note  Patient Name: Lum BabeBarbara Yadao Date of Encounter: 05/14/2017  Primary Cardiologist: D. Zina Pitzer, MD   Subjective   On 7/1/  RUE IV infiltrate.   Remains on lasix drip at 30 mg per hour + metolazone 5 mg twice a day. Urine output now picking up. ~4L urine output but weight down only 1 pound.  Remains fatigued as shee didn't sleep much due to urinating. RUE feels better. No dyspnea. +bloating. CVP 20. Co-ox 72%   Inpatient Medications    Scheduled Meds: . apixaban  5 mg Oral BID  . aspirin EC  81 mg Oral Daily  . atorvastatin  40 mg Oral q1800  . insulin aspart  0-20 Units Subcutaneous TID WC  . insulin aspart  0-5 Units Subcutaneous QHS  . lactulose  30 g Oral BID  . levothyroxine  50 mcg Oral QAC breakfast  . metolazone  5 mg Oral BID  . potassium chloride  40 mEq Oral TID  . pregabalin  100 mg Oral BID  . rifaximin  550 mg Oral BID  . sodium chloride flush  10-40 mL Intracatheter Q12H  . sodium chloride flush  3 mL Intravenous Q12H  . spironolactone  25 mg Oral Daily   Continuous Infusions: . sodium chloride    . amiodarone Stopped (05/12/17 1900)  . furosemide (LASIX) infusion 30 mg/hr (05/14/17 1000)  . milrinone 0.375 mcg/kg/min (05/14/17 1000)   PRN Meds: sodium chloride, acetaminophen, magnesium hydroxide, ondansetron (ZOFRAN) IV, sodium chloride flush, sodium chloride flush, traMADol   Vital Signs    Vitals:   05/13/17 2322 05/14/17 0507 05/14/17 0537 05/14/17 0600  BP: 99/66  (!) 104/57   Pulse: 92 100    Resp: 19   11  Temp: 98.3 F (36.8 C)     TempSrc: Oral Oral    SpO2: 97%     Weight:   110 kg (242 lb 9.6 oz) 109.6 kg (241 lb 9.6 oz)  Height:        Intake/Output Summary (Last 24 hours) at 05/14/17 1200 Last data filed at 05/14/17 0900  Gross per 24 hour  Intake           1101.6 ml  Output             3805 ml  Net          -2703.4 ml   Filed Weights   05/13/17 0608 05/14/17 0537 05/14/17 0600  Weight:  106.5 kg (234 lb 11.2 oz) 110 kg (242 lb 9.6 oz) 109.6 kg (241 lb 9.6 oz)    Physical Exam  CVP 20 personally checked.  General:  Sitting in [place]. Fatigued  HEENT: normal Neck: supple. JVP to ear. Carotids 2+ bilat; no bruits. No lymphadenopathy or thryomegaly appreciated. Cor: PMI nondisplaced. IRR IRR =s3. 2/6 TR Lungs: Decreased in bases  Abdomen: obese soft NT  +distended. No hepatosplenomegaly. No bruits or masses. Good bowel sounds. Extremities: no cyanosis, clubbing, rash, R and LLE 3-4+ edema + ted hose. LUE PICC. RUE ecchymotic and swollen forearm with small blister noted.  Neuro: alert & oriented x 3, cranial nerves grossly intact. moves all 4 extremities w/o difficulty. Affect pleasant   Labs    Chemistry  Recent Labs Lab 05/12/17 0500 05/13/17 0442 05/14/17 0513  NA 133* 125* 125*  K 3.3* 4.1 4.5  CL 97* 90* 90*  CO2 30 27 29   GLUCOSE 164* 295* 378*  BUN 46* 48* 46*  CREATININE 1.12* 1.28*  1.32*  CALCIUM 7.9* 8.2* 8.2*  GFRNONAA 50* 43* 41*  GFRAA 58* 50* 48*  ANIONGAP 6 8 6      Hematology  Recent Labs Lab 05/12/17 0500 05/13/17 0442 05/14/17 0513  WBC 4.8 4.7 4.2  RBC 4.50 4.20 4.14  HGB 12.5 11.8* 11.4*  HCT 39.2 36.6 36.2  MCV 87.1 87.1 87.4  MCH 27.8 28.1 27.5  MCHC 31.9 32.2 31.5  RDW 17.7* 17.6* 17.6*  PLT 205 203 204    Cardiac Enzymes  Recent Labs Lab 05/11/17 0215  TROPONINI 0.05*     Recent Labs Lab 05/10/17 1220  TROPIPOC 0.04     BNP  Recent Labs Lab 05/10/17 1148  BNP 669.7*     Radiology    No results found. Telemetry    AF 80-90s Personally reviewed   Patient Profile     Ms.Leatherbury is a 66 y.o. female w/ PMHx significant for morbid obesity, systolic HF due to ICM (10/12 EF 25%; Echo 6/17 EF 15-20%), CAD with h/o NSTEMI s/p CABG Oct 2012, CKD III, severe pulmonary hypertension, PAF, dietary noncompliance, and poorly controlled DM 2, admitted 6/30 with acute on chronic HFrEF and wt gain.  Assessment &  Plan    1. Acute on chronic biventricular HF (R>>L):  EF 15-20% 04/14/2016.  Presented 6/30 with marked volume overload and wt gain (up over 30 lbs since 04/16/17) in the setting of eating out frequently and not reporting wts/symptoms.   -- Remains on milrinone 0.375 mcg. On lasix drip at 30 mg per hour.  Will continue.  -- U/o improved but weight still up. Will fluid restrict -- Continue spiro No  blocker/acei/arb/arni due to soft bp's and low output.  2.  RUE IV infiltration:  7/1 [place] was neg for dvt.   -- Continues to improve.    -- Continue elevation and warm compresses. No evidence compartment syndrome   3.  CAD/Chest pressure/elevated troponin:  Mild trop elevation @ 0.05.   -- Not ACS. Likely due to HF .  Known to have severe native CAD with patent grafts on cath 12/2015.  Cont asa/statin. No chest pain.    4.  PAF:   -- Remains in afib - Rate controlled today. Continue eliquis 5 mg twice a day.  (CHA2DS2VASc = 5). -- Amio stopped due to fatigue  5.  CKD III:   --Creatinine trending up slowly 1.1>1.2 -> 1.3. Continue to follow   6.  DM II:  Cont SSI. Glucose stable.   7.  OSA:  Severe OSA on sleep study 12/25/16.  Needs CPAP.  8.  Morbid obesity: Body mass index is 39 kg/m.  Needs outpt dietary consultation.  9. PVCs -- Keep k> 4.0 mg > 2.0  10. Hyponatremia: -- stable at 125. Can repeat tolvaptan as needed. restrict free H2O  11. Hepatic encephalopathy -- controlled on lactulose and xifaxin. Check ammonia in am  12. DNR  Signed, Arvilla Meres, MD  05/14/2017, 12:00 PM

## 2017-05-15 LAB — COOXEMETRY PANEL
Carboxyhemoglobin: 1.5 % (ref 0.5–1.5)
Methemoglobin: 0.9 % (ref 0.0–1.5)
O2 Saturation: 67.9 %
TOTAL HEMOGLOBIN: 11.7 g/dL — AB (ref 12.0–16.0)

## 2017-05-15 LAB — AMMONIA: Ammonia: 49 umol/L — ABNORMAL HIGH (ref 9–35)

## 2017-05-15 LAB — CBC
HEMATOCRIT: 37.3 % (ref 36.0–46.0)
HEMOGLOBIN: 11.6 g/dL — AB (ref 12.0–15.0)
MCH: 27.2 pg (ref 26.0–34.0)
MCHC: 31.1 g/dL (ref 30.0–36.0)
MCV: 87.4 fL (ref 78.0–100.0)
Platelets: 215 10*3/uL (ref 150–400)
RBC: 4.27 MIL/uL (ref 3.87–5.11)
RDW: 17.8 % — ABNORMAL HIGH (ref 11.5–15.5)
WBC: 4.8 10*3/uL (ref 4.0–10.5)

## 2017-05-15 LAB — BASIC METABOLIC PANEL
ANION GAP: 9 (ref 5–15)
BUN: 48 mg/dL — ABNORMAL HIGH (ref 6–20)
CO2: 31 mmol/L (ref 22–32)
Calcium: 8.5 mg/dL — ABNORMAL LOW (ref 8.9–10.3)
Chloride: 87 mmol/L — ABNORMAL LOW (ref 101–111)
Creatinine, Ser: 1.33 mg/dL — ABNORMAL HIGH (ref 0.44–1.00)
GFR, EST AFRICAN AMERICAN: 47 mL/min — AB (ref >60)
GFR, EST NON AFRICAN AMERICAN: 41 mL/min — AB (ref >60)
Glucose, Bld: 355 mg/dL — ABNORMAL HIGH (ref 65–99)
Potassium: 4.3 mmol/L (ref 3.5–5.1)
Sodium: 127 mmol/L — ABNORMAL LOW (ref 135–145)

## 2017-05-15 LAB — GLUCOSE, CAPILLARY
GLUCOSE-CAPILLARY: 132 mg/dL — AB (ref 65–99)
GLUCOSE-CAPILLARY: 232 mg/dL — AB (ref 65–99)
Glucose-Capillary: 202 mg/dL — ABNORMAL HIGH (ref 65–99)
Glucose-Capillary: 135 mg/dL — ABNORMAL HIGH (ref 65–99)

## 2017-05-15 MED ORDER — INSULIN GLARGINE 100 UNIT/ML ~~LOC~~ SOLN
8.0000 [IU] | Freq: Every day | SUBCUTANEOUS | Status: DC
  Administered 2017-05-16 – 2017-05-30 (×14): 8 [IU] via SUBCUTANEOUS
  Filled 2017-05-15 (×16): qty 0.08

## 2017-05-15 NOTE — Progress Notes (Signed)
Physical Therapy Treatment Patient Details Name: Christine Knight MRN: [id number] DOB: Oct 06, 1951 Today's Date: 05/15/2017    History of Present Illness Patient is a 66 y/o female who presents with heart failure and volume overload. CXR-Small left pleural effusion. Recently admitted 4/24-5/3 for fluid overload. PMH includes morbid obesity, systolic HF due to ICM, CAD with hx of NSTEMI s/p CABG Oct 2012, pulmonary HTN, DM.    PT Comments    Progressing with mobility able to achieve most of initial PT goals.  Educated in [place] for managing arthritis.  She reported mild instability due to new unna boots, but no signs of LOB or staggering this session.  New goals for balance and gait set.  Will continue acute level PT prior to d/c home without PT follow up.    Follow Up Recommendations  No PT follow up;Supervision for mobility/OOB     Equipment Recommendations  None recommended by PT    Recommendations for Other Services       Precautions / Restrictions Precautions Precaution Comments: RUE IV infiltrate    Mobility  Bed Mobility               General bed mobility comments: standing in room upon my entry  Transfers Overall transfer level: Needs assistance Equipment used: None Transfers: Sit to/from Stand Sit to Stand: Modified independent (Device/Increase time)            Ambulation/Gait Ambulation/Gait assistance: Supervision Ambulation Distance (Feet): 400 Feet Assistive device: None Gait Pattern/deviations: Step-through pattern;Decreased stride length     General Gait Details: No rest breaks this session, slow steady gait with HR max 141; maintained mostly in 120's, noted DOE 2/4   Stairs            Wheelchair Mobility    Modified Rankin (Stroke Patients Only)       Balance Overall balance assessment: Needs assistance   Sitting balance-[name] Scale: Normal       Standing balance-[name] Scale: Good Standing balance comment: able to walk without  device and maintain semi-straight path despite conversation and environmental scanning                            Cognition Arousal/Alertness: Awake/alert Behavior During Therapy: WFL for tasks assessed/performed Overall Cognitive Status: Within Functional Limits for tasks assessed                                        Exercises General Exercises - Lower Extremity Ankle Circles/Pumps: AROM;Both;5 reps;Seated Quad Sets: AROM;Both;5 reps;Seated Long Arc Quad: AROM;Both;5 reps;Seated Heel Slides: AROM;Both;5 reps;Seated    General Comments        Pertinent Vitals/Pain Faces Pain Scale: Hurts a little bit Pain Location: RUE Pain Descriptors / Indicators: Sore Pain Intervention(s): Monitored during session;Repositioned    Home Living                      Prior Function            PT Goals (current goals can now be found in the care plan section) Progress towards PT goals: Goals met and updated - see care plan    Frequency    Min 3X/week      PT Plan Current plan remains appropriate    Co-evaluation  AM-PAC PT "6 Clicks" Daily Activity  Outcome Measure  Difficulty turning over in bed (including adjusting bedclothes, sheets and blankets)?: None Difficulty moving from lying on back to sitting on the side of the bed? : None Difficulty sitting down on and standing up from a chair with arms (e.g., wheelchair, bedside commode, etc,.)?: None Help needed moving to and from a bed to chair (including a wheelchair)?: None Help needed walking in hospital room?: None Help needed climbing 3-5 steps with a railing? : A Little 6 Click Score: 23    End of Session   Activity Tolerance: Patient tolerated treatment well Patient left: in chair;with call bell/phone within reach   PT Visit Diagnosis: Other (comment);Unsteadiness on feet (R26.81)     Time: 1255-1315 PT Time Calculation (min) (ACUTE ONLY): 20 min  Charges:   $Gait Training: 8-22 mins                    G Codes:       Cyndi , PT [phone] 05/15/2017      05/15/2017, 1:42 PM   

## 2017-05-15 NOTE — Progress Notes (Signed)
[facility]  Patient Status: Active (receiving services up to time of hospitalization)  [facility] is providing the following services: RN  If patient discharges after hours, please call [phone].   Christine FurnishDonna Knight 05/15/2017, 9:55 AM

## 2017-05-15 NOTE — Progress Notes (Signed)
Advanced HF Team Progress Note  Patient Name: Christine Knight Date of Encounter: 05/15/2017  Primary Cardiologist: D. Advik Weatherspoon, MD   Subjective   On 7/1/  RUE IV infiltrate.   Remains on lasix drip at 30 mg per hour + metolazone 5 mg twice a day. Weight coming down. Negative 2.3 liters. CO-OX 68%  RUE improving. Denies SOB.   Inpatient Medications    Scheduled Meds: . apixaban  5 mg Oral BID  . aspirin EC  81 mg Oral Daily  . atorvastatin  40 mg Oral q1800  . insulin aspart  0-20 Units Subcutaneous TID WC  . insulin aspart  0-5 Units Subcutaneous QHS  . insulin aspart  5 Units Subcutaneous TID WC  . lactulose  30 g Oral BID  . levothyroxine  50 mcg Oral QAC breakfast  . metolazone  5 mg Oral BID  . potassium chloride  40 mEq Oral TID  . pregabalin  100 mg Oral BID  . rifaximin  550 mg Oral BID  . sodium chloride flush  10-40 mL Intracatheter Q12H  . sodium chloride flush  3 mL Intravenous Q12H  . spironolactone  25 mg Oral Daily   Continuous Infusions: . sodium chloride    . furosemide (LASIX) infusion 30 mg/hr (05/15/17 0700)  . milrinone 0.375 mcg/kg/min (05/15/17 0900)   PRN Meds: sodium chloride, acetaminophen, magnesium hydroxide, ondansetron (ZOFRAN) IV, sodium chloride flush, sodium chloride flush, traMADol   Vital Signs    Vitals:   05/15/17 0300 05/15/17 0353 05/15/17 0459 05/15/17 0833  BP:  96/60  104/69  Pulse:  (!) 105  (!) 110  Resp: 17 14  16   Temp:  98.9 F (37.2 C)  97.8 F (36.6 C)  TempSrc:  Oral  Oral  SpO2:    95%  Weight:   234 lb 8 oz (106.4 kg)   Height:        Intake/Output Summary (Last 24 hours) at 05/15/17 0930 Last data filed at 05/15/17 0700  Gross per 24 hour  Intake           1451.4 ml  Output             3350 ml  Net          -1898.6 ml   Filed Weights   05/14/17 0537 05/14/17 0600 05/15/17 0459  Weight: 242 lb 9.6 oz (110 kg) 241 lb 9.6 oz (109.6 kg) 234 lb 8 oz (106.4 kg)    Physical Exam  CVP 19  General:   Well appearing. No resp difficulty. Sitting in the chair.  HEENT: normal Neck: supple. no JVD. Carotids 2+ bilat; no bruits. No lymphadenopathy or thryomegaly appreciated. Cor: PMI nondisplaced. Regular rate & rhythm. No rubs, gallops or murmurs. Lungs: clear Abdomen: soft, nontender, nondistended. No hepatosplenomegaly. No bruits or masses. Good bowel sounds. Extremities: no cyanosis, clubbing, rash, R and LLE 3+ edema LUE  Trace edema. RUE radial pulse 2+. LUE PICC Neuro: alert & orientedx3, cranial nerves grossly intact. moves all 4 extremities w/o difficulty. Affect pleasant   Labs    Chemistry  Recent Labs Lab 05/13/17 0442 05/14/17 0513 05/15/17 0506  NA 125* 125* 127*  K 4.1 4.5 4.3  CL 90* 90* 87*  CO2 27 29 31   GLUCOSE 295* 378* 355*  BUN 48* 46* 48*  CREATININE 1.28* 1.32* 1.33*  CALCIUM 8.2* 8.2* 8.5*  GFRNONAA 43* 41* 41*  GFRAA 50* 48* 47*  ANIONGAP 8 6 9  Hematology  Recent Labs Lab 05/13/17 0442 05/14/17 0513 05/15/17 0506  WBC 4.7 4.2 4.8  RBC 4.20 4.14 4.27  HGB 11.8* 11.4* 11.6*  HCT 36.6 36.2 37.3  MCV 87.1 87.4 87.4  MCH 28.1 27.5 27.2  MCHC 32.2 31.5 31.1  RDW 17.6* 17.6* 17.8*  PLT 203 204 215    Cardiac Enzymes  Recent Labs Lab 05/11/17 0215  TROPONINI 0.05*     Recent Labs Lab 05/10/17 1220  TROPIPOC 0.04     BNP  Recent Labs Lab 05/10/17 1148  BNP 669.7*     Radiology    No results found. Telemetry    A fib 90s personally reviewed.    Patient Profile     Christine Knight is a 66 y.o. female w/ PMHx significant for morbid obesity, systolic HF due to ICM (10/12 EF 25%; Echo 6/17 EF 15-20%), CAD with h/o NSTEMI s/p CABG Oct 2012, CKD III, severe pulmonary hypertension, PAF, dietary noncompliance, and poorly controlled DM 2, admitted 6/30 with acute on chronic HFrEF and wt gain.  Assessment & Plan    1. Acute on chronic biventricular HF (R>>L):  EF 15-20% 04/14/2016.  Presented 6/30 with marked volume overload and  wt gain (up over 30 lbs since 04/16/17) in the setting of eating out frequently and not reporting wts/symptoms.   -CO-OX stable.  -- Remains on milrinone 0.375 mcg. On lasix drip at 30 mg per hour.  Will continue.  --Brisk diuresis noted. Continue current regimen. Renal function stable.  -- Continue spiro No  blocker/acei/arb/arni due to soft bp's and low output. --Add unna boots RLE and LLE.   2.  RUE IV infiltration:  7/1 US was neg for dvt.   --Improving daily.  -- Continue elevation and warm compresses. No evidence compartment syndrome   3.  CAD/Chest pressure/elevated troponin:  Mild trop elevation @ 0.05.   -- Not ACS. Likely due to HF .  Known to have severe native CAD with patent grafts on cath 12/2015.  Cont asa/statin. No chest pain.     4.  PAF:   -- Remains in afib - Rate controlled today. Continue eliquis 5 mg twice a day.  (CHA2DS2VASc = 5). -- Amio stopped due to fatigue  5.  CKD III:   --Creatinine unchanged 1.3 today. Follow BMET daily.   6.  DM II:  Cont SSI. Consult diabetes coordinator. Glucose up.    7.  OSA:  Severe OSA on sleep study 12/25/16.  Needs CPAP.  8.  Morbid obesity: Body mass index is 37.85 kg/m.  Needs outpt dietary consultation.  9. PVCs -- Keep k> 4.0 mg > 2.0  10. Hyponatremia: -- sodium 127. Can repeat tolvaptan as needed. restrict free H2O  11. Hepatic encephalopathy -- Ammonia 49. Continue  Lactulose and xifaxin.  12. DNR  Signed, Tonye BecketAmy Clegg, NP  05/15/2017, 9:30 AM    Patient seen and examined with Tonye BecketAmy Clegg, NP. We discussed all aspects of the encounter. I agree with the assessment and plan as stated above.   Remains very tenuous but volume status finally starting to improve on high-dose lasix and milrinone drips + metolazone. Renal function stable.   RUE improved. No signs of compartment syndrome.   Agree with UNNA boots  AF rate controlled. Continue Eliquis for [facility].   Arvilla MeresBensimhon, Nicolaus Andel, MD  9:39 PM

## 2017-05-15 NOTE — Progress Notes (Signed)
Orthopedic Tech Progress Note Patient Details:  Christine Knight 1951/04/11 [id number]  Ortho Devices Type of Ortho Device: Roland RackUnna boot Ortho Device/Splint Location: Bilateral unna boots Ortho Device/Splint Interventions: Application   Saul FordyceJennifer C Bravlio Luca 05/15/2017, 1:07 PM

## 2017-05-15 NOTE — Progress Notes (Addendum)
Inpatient Diabetes Program Recommendations  AACE/ADA: New Consensus Statement on Inpatient Glycemic Control (2015)  Target Ranges:  Prepandial:   less than 140 mg/dL      Peak postprandial:   less than 180 mg/dL (1-2 hours)      Critically ill patients:  140 - 180 mg/dL   Results for [name], Saleema (MRN [id number]) as of 05/15/2017 13:31  Ref. Range 05/14/2017 07:40 05/14/2017 12:49 05/14/2017 17:56 05/14/2017 21:07 05/15/2017 08:26 05/15/2017 13:26  Glucose-Capillary Latest Ref Range: 65 - 99 mg/dL 409209 (H) 811203 (H) 914154 (H) 191 (H) 202 (H) 132 (H)   Review of Glycemic Control  Diabetes history: DM 2 Outpatient Diabetes medications: No medications listed Current orders for Inpatient glycemic control: Novolog Resistant 0-20 units tid + Novolog 5 units tid meal coverage + Novolog HS scale  Inpatient Diabetes Program Recommendations:    Meal coverage added yesterday evening. Glucose improved post prandially. Fasting glucose elevated 212 mg/dl this am.  Note Lab glucose reading in the 300's while finger sticks are 100-200 range.  Consider low dose basal insulin while inpatient. Lantus 8 units Q24 hours.  Reduce Novolog Correction to Moderate due to renal function.  Thanks,  Christena DeemShannon Shaunte Weissinger RN, MSN, [facility] Inpatient Diabetes Coordinator Team Pager [phone] (8a-5p)

## 2017-05-16 LAB — COOXEMETRY PANEL
CARBOXYHEMOGLOBIN: 2.1 % — AB (ref 0.5–1.5)
METHEMOGLOBIN: 0.9 % (ref 0.0–1.5)
O2 SAT: 67.3 %
Total hemoglobin: 11.8 g/dL — ABNORMAL LOW (ref 12.0–16.0)

## 2017-05-16 LAB — GLUCOSE, CAPILLARY
GLUCOSE-CAPILLARY: 200 mg/dL — AB (ref 65–99)
GLUCOSE-CAPILLARY: 219 mg/dL — AB (ref 65–99)
GLUCOSE-CAPILLARY: 278 mg/dL — AB (ref 65–99)
GLUCOSE-CAPILLARY: 142 mg/dL — AB (ref 65–99)

## 2017-05-16 LAB — CBC
HCT: 36.3 % (ref 36.0–46.0)
Hemoglobin: 11.6 g/dL — ABNORMAL LOW (ref 12.0–15.0)
MCH: 27.7 pg (ref 26.0–34.0)
MCHC: 32 g/dL (ref 30.0–36.0)
MCV: 86.6 fL (ref 78.0–100.0)
PLATELETS: 204 10*3/uL (ref 150–400)
RBC: 4.19 MIL/uL (ref 3.87–5.11)
RDW: 17.6 % — ABNORMAL HIGH (ref 11.5–15.5)
WBC: 4.9 10*3/uL (ref 4.0–10.5)

## 2017-05-16 LAB — BASIC METABOLIC PANEL
Anion gap: 7 (ref 5–15)
BUN: 50 mg/dL — AB (ref 6–20)
CO2: 32 mmol/L (ref 22–32)
Calcium: 8.5 mg/dL — ABNORMAL LOW (ref 8.9–10.3)
Chloride: 86 mmol/L — ABNORMAL LOW (ref 101–111)
Creatinine, Ser: 1.32 mg/dL — ABNORMAL HIGH (ref 0.44–1.00)
GFR, EST AFRICAN AMERICAN: 48 mL/min — AB (ref >60)
GFR, EST NON AFRICAN AMERICAN: 41 mL/min — AB (ref >60)
Glucose, Bld: 393 mg/dL — ABNORMAL HIGH (ref 65–99)
POTASSIUM: 3.9 mmol/L (ref 3.5–5.1)
SODIUM: 125 mmol/L — AB (ref 135–145)

## 2017-05-16 NOTE — Progress Notes (Signed)
Advanced HF Team Progress Note  Patient Name: Christine Knight Date of Encounter: 05/16/2017  Primary Cardiologist: D. Nahiara Kretzschmar, MD   Subjective   On 7/1/  RUE IV infiltrate.   Brisk diuresis with milrinone 0.375 mcg + lasix drip at 30 mg per hour + metolazone. Negative 4.9 liters. Weight down 4 pounds overnight. Yesterday ambulated with PT.    Denies  SOB. Feeling better.   Inpatient Medications    Scheduled Meds: . apixaban  5 mg Oral BID  . aspirin EC  81 mg Oral Daily  . atorvastatin  40 mg Oral q1800  . insulin aspart  0-20 Units Subcutaneous TID WC  . insulin aspart  0-5 Units Subcutaneous QHS  . insulin aspart  5 Units Subcutaneous TID WC  . insulin glargine  8 Units Subcutaneous Daily  . lactulose  30 g Oral BID  . levothyroxine  50 mcg Oral QAC breakfast  . metolazone  5 mg Oral BID  . potassium chloride  40 mEq Oral TID  . pregabalin  100 mg Oral BID  . rifaximin  550 mg Oral BID  . sodium chloride flush  10-40 mL Intracatheter Q12H  . sodium chloride flush  3 mL Intravenous Q12H  . spironolactone  25 mg Oral Daily   Continuous Infusions: . sodium chloride    . furosemide (LASIX) infusion 30 mg/hr (05/16/17 0458)  . milrinone 0.375 mcg/kg/min (05/15/17 2354)   PRN Meds: sodium chloride, acetaminophen, magnesium hydroxide, ondansetron (ZOFRAN) IV, sodium chloride flush, sodium chloride flush, traMADol   Vital Signs    Vitals:   05/15/17 1642 05/15/17 1710 05/16/17 0013 05/16/17 0343  BP: 113/66 113/66 (!) 107/47 119/62  Pulse:  100 (!) 108 (!) 112  Resp: 18 16 18 12   Temp: 97.6 F (36.4 C) (!) 96.8 F (36 C) 98.5 F (36.9 C) 98.6 F (37 C)  TempSrc: Oral Axillary Oral Oral  SpO2:  95% 97% 96%  Weight:    230 lb 9.6 oz (104.6 kg)  Height:        Intake/Output Summary (Last 24 hours) at 05/16/17 0749 Last data filed at 05/16/17 0500  Gross per 24 hour  Intake           1528.4 ml  Output             6500 ml  Net          -4971.6 ml   Filed  Weights   05/14/17 0600 05/15/17 0459 05/16/17 0343  Weight: 241 lb 9.6 oz (109.6 kg) 234 lb 8 oz (106.4 kg) 230 lb 9.6 oz (104.6 kg)    Physical Exam  CVP 17-18  General:  Well appearing. No resp difficulty. In the chair HEENT: normal Neck: supple. JVP to jaw. Carotids 2+ bilat; no bruits. No lymphadenopathy or thryomegaly appreciated. Cor: PMI nondisplaced. Irregular rate & rhythm. No rubs, gallops or murmurs. Lungs: clear Abdomen: soft, nontender, nondistended. No hepatosplenomegaly. No bruits or masses. Good bowel sounds. Extremities: no cyanosis, clubbing, rash, R and LLE unna boots. RUE induration upper forearm from IV infiltrate. R radial pulse 3+ LUE PICC Neuro: alert & orientedx3, cranial nerves grossly intact. moves all 4 extremities w/o difficulty. Affect pleasant   Labs    Chemistry  Recent Labs Lab 05/14/17 0513 05/15/17 0506 05/16/17 0454  NA 125* 127* 125*  K 4.5 4.3 3.9  CL 90* 87* 86*  CO2 29 31 32  GLUCOSE 378* 355* 393*  BUN 46* 48* 50*  CREATININE 1.32*  1.33* 1.32*  CALCIUM 8.2* 8.5* 8.5*  GFRNONAA 41* 41* 41*  GFRAA 48* 47* 48*  ANIONGAP 6 9 7      Hematology  Recent Labs Lab 05/14/17 0513 05/15/17 0506 05/16/17 0454  WBC 4.2 4.8 4.9  RBC 4.14 4.27 4.19  HGB 11.4* 11.6* 11.6*  HCT 36.2 37.3 36.3  MCV 87.4 87.4 86.6  MCH 27.5 27.2 27.7  MCHC 31.5 31.1 32.0  RDW 17.6* 17.8* 17.6*  PLT 204 215 204    Cardiac Enzymes  Recent Labs Lab 05/11/17 0215  TROPONINI 0.05*     Recent Labs Lab 05/10/17 1220  TROPIPOC 0.04     BNP  Recent Labs Lab 05/10/17 1148  BNP 669.7*     Radiology    No results found. Telemetry    A fib 90-100s. Personally reviewed.    Patient Profile     Christine Knight is a 66 y.o. female w/ PMHx significant for morbid obesity, systolic HF due to ICM (10/12 EF 25%; Echo 6/17 EF 15-20%), CAD with h/o NSTEMI s/p CABG Oct 2012, CKD III, severe pulmonary hypertension, PAF, dietary noncompliance, and poorly  controlled DM 2, admitted 6/30 with acute on chronic HFrEF and wt gain.  Assessment & Plan    1. Acute on chronic biventricular HF (R>>L):  EF 15-20% 04/14/2016.  Presented 6/30 with marked volume overload and wt gain (up over 30 lbs since 04/16/17) in the setting of eating out frequently and not reporting wts/symptoms.   CO-OX 67%. Continue milrinone 0.375 mcg + lasix drip at 30 mg per hour + metolazone 5 mg twice a day. Brisk diuresis. Negative over 4 liters. Weight trending down. Most recent discharge weight 208 pounds. Prior to admit she was taking torsemide 80/80 mg twice a day.  -- Continue spiro No  blocker/acei/arb/arni due to soft bp's and low output. --Continue unna boots RLE and LLE.   2.  RUE IV infiltration:  7/1 [place] was neg for dvt.   --Improving daily.  -- Continue elevation and warm compresses.   3.  CAD/Chest pressure/elevated troponin:  Mild trop elevation @ 0.05.   -- Not ACS. Likely due to HF .  Known to have severe native CAD with patent grafts on cath 12/2015.  Cont asa/statin. No chest pain.     4.  PAF:   -- Remains in afib - Rate controlled today. Continue eliquis 5 mg twice a day.  (CHA2DS2VASc = 5). -- Amio stopped due to fatigue  5.  CKD III:   --Stable. Creatinine 1.3  Follow BMET daily.   6.  DM II:  Cont SSI. Diabetes Coordinator appreciated.     7.  OSA:  Severe OSA on sleep study 12/25/16.  Needs CPAP.  8.  Morbid obesity: Body mass index is 37.22 kg/m.  Needs outpt dietary consultation.  9. PVCs -- Keep k> 4.0 mg > 2.0  10. Hyponatremia: --sodium down to 125.   restrict free H2O. Give tolvaptan tomorrow if sodium lower.   11. Hepatic encephalopathy -- Ammonia 49 on 05/15/2017. Continue  Lactulose and xifaxin.  12. DNR  Signed, Tonye Becket, NP  05/16/2017, 7:49 AM    Patient seen and examined with Tonye Becket, NP. We discussed all aspects of the encounter. I agree with the assessment and plan as stated above.   She is making progress on current  regimen but remains markedly volume overloaded. Continue current regimen. Watch renal function. Fluid restrict.   Arvilla Meres, MD  3:57 PM

## 2017-05-17 LAB — GLUCOSE, CAPILLARY
GLUCOSE-CAPILLARY: 182 mg/dL — AB (ref 65–99)
GLUCOSE-CAPILLARY: 253 mg/dL — AB (ref 65–99)
GLUCOSE-CAPILLARY: 284 mg/dL — AB (ref 65–99)
Glucose-Capillary: 96 mg/dL (ref 65–99)

## 2017-05-17 LAB — BASIC METABOLIC PANEL
ANION GAP: 8 (ref 5–15)
BUN: 49 mg/dL — ABNORMAL HIGH (ref 6–20)
CO2: 32 mmol/L (ref 22–32)
Calcium: 8.6 mg/dL — ABNORMAL LOW (ref 8.9–10.3)
Chloride: 88 mmol/L — ABNORMAL LOW (ref 101–111)
Creatinine, Ser: 1.32 mg/dL — ABNORMAL HIGH (ref 0.44–1.00)
GFR, EST AFRICAN AMERICAN: 48 mL/min — AB (ref >60)
GFR, EST NON AFRICAN AMERICAN: 41 mL/min — AB (ref >60)
GLUCOSE: 308 mg/dL — AB (ref 65–99)
POTASSIUM: 3.7 mmol/L (ref 3.5–5.1)
SODIUM: 128 mmol/L — AB (ref 135–145)

## 2017-05-17 LAB — CBC
HCT: 37.3 % (ref 36.0–46.0)
Hemoglobin: 11.9 g/dL — ABNORMAL LOW (ref 12.0–15.0)
MCH: 28 pg (ref 26.0–34.0)
MCHC: 31.9 g/dL (ref 30.0–36.0)
MCV: 87.8 fL (ref 78.0–100.0)
PLATELETS: 227 10*3/uL (ref 150–400)
RBC: 4.25 MIL/uL (ref 3.87–5.11)
RDW: 17.8 % — ABNORMAL HIGH (ref 11.5–15.5)
WBC: 4.9 10*3/uL (ref 4.0–10.5)

## 2017-05-17 LAB — COOXEMETRY PANEL
Carboxyhemoglobin: 1.7 % — ABNORMAL HIGH (ref 0.5–1.5)
Methemoglobin: 1.1 % (ref 0.0–1.5)
O2 SAT: 69.4 %
Total hemoglobin: 12.1 g/dL (ref 12.0–16.0)

## 2017-05-17 LAB — MAGNESIUM: MAGNESIUM: 1.8 mg/dL (ref 1.7–2.4)

## 2017-05-17 MED ORDER — MAGNESIUM SULFATE 2 GM/50ML IV SOLN
2.0000 g | Freq: Once | INTRAVENOUS | Status: AC
  Administered 2017-05-17: 2 g via INTRAVENOUS
  Filled 2017-05-17: qty 50

## 2017-05-17 NOTE — Progress Notes (Signed)
Inpatient Diabetes Program Recommendations  AACE/ADA: New Consensus Statement on Inpatient Glycemic Control (2015)  Target Ranges:  Prepandial:   less than 140 mg/dL      Peak postprandial:   less than 180 mg/dL (1-2 hours)      Critically ill patients:  140 - 180 mg/dL   Lab Results  Component Value Date   GLUCAP 142 (H) 05/16/2017   HGBA1C 8.1 (H) 05/11/2017    Review of Glycemic Control  Results for [name], Christine Knight (MRN [id number]) as of 05/17/2017 08:24  Ref. Range 05/15/2017 22:57 05/16/2017 08:00 05/16/2017 12:06 05/16/2017 15:17 05/16/2017 21:20  Glucose-Capillary Latest Ref Range: 65 - 99 mg/dL 782135 (H) 956200 (H) 213219 (H) 278 (H) 142 (H)   Diabetes history: DM 2 Outpatient Diabetes medications: No medications listed  Current orders for Inpatient glycemic control: Novolog resistant 0-20 units tid, Novolog 5 units tid meal coverage,  Novolog HS scale, Lantus 8 units qam  Inpatient Diabetes Program Recommendations:    Meal coverage added yesterday evening. Consider reducing Novolog Correction to Moderate 0-15 units tid due to renal function.   Susette RacerJulie Ladarrius Bogdanski, RN, BA, MHA, CDE Diabetes Coordinator Inpatient Diabetes Program  [phone] (Team Pager) [phone] [facility] Office) 05/17/2017 8:26 AM ,

## 2017-05-17 NOTE — Progress Notes (Signed)
Advanced HF Team Progress Note  Patient Name: Christine BabeBarbara Knight Date of Encounter: 05/17/2017  Primary Cardiologist: D. Jann Ra, MD   Subjective   On 7/1/  RUE IV infiltrate.   Brisk diuresis with milrinone 0.375 mcg + lasix drip at 30 mg per hour + metolazone. Negative 5.2 liters. Weight down 8 pounds overnight. Feeling better. CVP 11. Denies SOB   Inpatient Medications    Scheduled Meds: . apixaban  5 mg Oral BID  . aspirin EC  81 mg Oral Daily  . atorvastatin  40 mg Oral q1800  . insulin aspart  0-20 Units Subcutaneous TID WC  . insulin aspart  0-5 Units Subcutaneous QHS  . insulin aspart  5 Units Subcutaneous TID WC  . insulin glargine  8 Units Subcutaneous Daily  . lactulose  30 g Oral BID  . levothyroxine  50 mcg Oral QAC breakfast  . metolazone  5 mg Oral BID  . potassium chloride  40 mEq Oral TID  . pregabalin  100 mg Oral BID  . rifaximin  550 mg Oral BID  . sodium chloride flush  10-40 mL Intracatheter Q12H  . sodium chloride flush  3 mL Intravenous Q12H  . spironolactone  25 mg Oral Daily   Continuous Infusions: . sodium chloride    . furosemide (LASIX) infusion 30 mg/hr (05/16/17 2350)  . milrinone 0.375 mcg/kg/min (05/16/17 2258)   PRN Meds: sodium chloride, acetaminophen, magnesium hydroxide, ondansetron (ZOFRAN) IV, sodium chloride flush, sodium chloride flush, traMADol   Vital Signs    Vitals:   05/17/17 0211 05/17/17 0232 05/17/17 0354 05/17/17 0400  BP: (!) 89/55 (!) 93/52 97/65 (!) 107/52  Pulse:   (!) 106   Resp: 15 17 17 15   Temp:      TempSrc:      SpO2:   97%   Weight:   101.1 kg (222 lb 12.8 oz)   Height:        Intake/Output Summary (Last 24 hours) at 05/17/17 0717 Last data filed at 05/17/17 0647  Gross per 24 hour  Intake           1252.8 ml  Output             5275 ml  Net          -4022.2 ml   Filed Weights   05/15/17 0459 05/16/17 0343 05/17/17 0354  Weight: 106.4 kg (234 lb 8 oz) 104.6 kg (230 lb 9.6 oz) 101.1 kg (222  lb 12.8 oz)    Physical Exam  CVP 11-12 General:  NAD. Sitting in the chair HEENT: normal Neck: supple. JVP to jaw. Carotids 2+ bilat; no bruits. No lymphadenopathy or thryomegaly appreciated. Cor: PMI nondisplaced. Irregular rate & rhythm. 2/6 TR Lungs: clear no wheeze Abdomen: soft, nontender, + mildly distended. No hepatosplenomegaly. No bruits or masses. Good bowel sounds. Extremities: no cyanosis, clubbing, rash, R and LLE unna boots. 1-2+ edema  RUE induration upper forearm from IV infiltrate improving .+ LUE PICC Neuro: alert & oriented x 3, cranial nerves grossly intact. moves all 4 extremities w/o difficulty. Affect pleasant    Labs    Chemistry  Recent Labs Lab 05/15/17 0506 05/16/17 0454 05/17/17 0433  NA 127* 125* 128*  K 4.3 3.9 3.7  CL 87* 86* 88*  CO2 31 32 32  GLUCOSE 355* 393* 308*  BUN 48* 50* 49*  CREATININE 1.33* 1.32* 1.32*  CALCIUM 8.5* 8.5* 8.6*  GFRNONAA 41* 41* 41*  GFRAA 47* 48* 48*  [facility] 9 7 8      Hematology  Recent Labs Lab 05/15/17 0506 05/16/17 0454 05/17/17 0433  WBC 4.8 4.9 4.9  RBC 4.27 4.19 4.25  HGB 11.6* 11.6* 11.9*  HCT 37.3 36.3 37.3  MCV 87.4 86.6 87.8  MCH 27.2 27.7 28.0  MCHC 31.1 32.0 31.9  RDW 17.8* 17.6* 17.8*  PLT 215 204 227    Cardiac Enzymes  Recent Labs Lab 05/11/17 0215  TROPONINI 0.05*     Recent Labs Lab 05/10/17 1220  TROPIPOC 0.04     BNP  Recent Labs Lab 05/10/17 1148  BNP 669.7*     Radiology    No results found. Telemetry    A fib 90-100s. Personally reviewed.    Patient Profile     Ms.Kalman is a 66 y.o. female w/ PMHx significant for morbid obesity, systolic HF due to ICM (10/12 EF 25%; Echo 6/17 EF 15-20%), CAD with h/o NSTEMI s/p CABG Oct 2012, CKD III, severe pulmonary hypertension, PAF, dietary noncompliance, and poorly controlled DM 2, admitted 6/30 with acute on chronic HFrEF and wt gain.  Assessment & Plan    1. Acute on chronic biventricular HF (R>>L):   EF 15-20% 04/14/2016.  Presented 6/30 with marked volume overload and wt gain (up over 30 lbs since 04/16/17) in the setting of eating out frequently and not reporting wts/symptoms.   CO-OX 69%. Continue milrinone 0.375 mcg + lasix drip at 30 mg per hour + metolazone 5 mg twice a day. Brisk diuresis. Negative over 4 liters. Weight trending down. Most recent discharge weight 208 pounds. Prior to admit she was taking torsemide 80/80 mg twice a day.  -- Volume status improving still about 15 pounds from baseline. Will continue current regimen but decrease milrinone to 0.25 -- Continue spiro  - No  blocker/acei/arb/arni due to soft bp's and low output. --Continue unna boots RLE and LLE.   2.  RUE IV infiltration:  7/1 [place] was neg for dvt.   -- Continues to improve -- Continue elevation and warm compresses.   3.  CAD/Chest pressure/elevated troponin:  Mild trop elevation @ 0.05.   -- Not ACS. Likely due to HF .  Known to have severe native CAD with patent grafts on cath 12/2015.  Cont asa/statin. No chest pain.     4.  PAF:   -- Remains in afib - Rate slighly elevated today. Decrease milrinone to 0.25 -- Continue eliquis 5 mg twice a day.  (CHA2DS2VASc = 5). -- Amio stopped due to fatigue. Can restart as needed  5.  CKD III:   --Stable. Creatinine 1.32  Follow BMET daily.   6.  DM II:  Cont SSI. Diabetes Coordinator appreciated.     7.  OSA:  Severe OSA on sleep study 12/25/16.  Needs CPAP.  8.  Morbid obesity: Body mass index is 35.96 kg/m.  Needs outpt dietary consultation.  9. PVCs -- Keep k> 4.0 mg > 2.0  10. Hyponatremia: --sodium up to 128. Continue restrict free H2O.\  11. Hepatic encephalopathy -- Ammonia 49 on 05/15/2017. Continue  Lactulose and xifaxin.   12. DNR  Signed, Arvilla Meres, MD  05/17/2017, 7:17 AM

## 2017-05-18 LAB — CBC
HEMATOCRIT: 37.2 % (ref 36.0–46.0)
HEMOGLOBIN: 12.2 g/dL (ref 12.0–15.0)
MCH: 28.3 pg (ref 26.0–34.0)
MCHC: 32.8 g/dL (ref 30.0–36.0)
MCV: 86.3 fL (ref 78.0–100.0)
Platelets: 217 10*3/uL (ref 150–400)
RBC: 4.31 MIL/uL (ref 3.87–5.11)
RDW: 17.2 % — AB (ref 11.5–15.5)
WBC: 5.4 10*3/uL (ref 4.0–10.5)

## 2017-05-18 LAB — COOXEMETRY PANEL
Carboxyhemoglobin: 1.7 % — ABNORMAL HIGH (ref 0.5–1.5)
METHEMOGLOBIN: 1 % (ref 0.0–1.5)
O2 Saturation: 66.7 %
TOTAL HEMOGLOBIN: 12.2 g/dL (ref 12.0–16.0)

## 2017-05-18 LAB — BASIC METABOLIC PANEL
Anion gap: 9 (ref 5–15)
BUN: 50 mg/dL — AB (ref 6–20)
CALCIUM: 8.9 mg/dL (ref 8.9–10.3)
CHLORIDE: 85 mmol/L — AB (ref 101–111)
CO2: 34 mmol/L — AB (ref 22–32)
CREATININE: 1.37 mg/dL — AB (ref 0.44–1.00)
GFR calc non Af Amer: 40 mL/min — ABNORMAL LOW (ref >60)
GFR, EST AFRICAN AMERICAN: 46 mL/min — AB (ref >60)
GLUCOSE: 224 mg/dL — AB (ref 65–99)
Potassium: 3.8 mmol/L (ref 3.5–5.1)
Sodium: 128 mmol/L — ABNORMAL LOW (ref 135–145)

## 2017-05-18 LAB — GLUCOSE, CAPILLARY
GLUCOSE-CAPILLARY: 232 mg/dL — AB (ref 65–99)
GLUCOSE-CAPILLARY: 148 mg/dL — AB (ref 65–99)
Glucose-Capillary: 195 mg/dL — ABNORMAL HIGH (ref 65–99)
Glucose-Capillary: 291 mg/dL — ABNORMAL HIGH (ref 65–99)

## 2017-05-18 NOTE — Progress Notes (Signed)
Advanced HF Team Progress Note  Patient Name: Christine Knight Date of Encounter: 05/18/2017  Primary Cardiologist: D. Bensimhon, MD   Subjective   On 7/1/  RUE IV infiltrate.   Continues with brisk diuresis with milrinone 0.375 mcg + lasix drip at 30 mg per hour + metolazone. Negative 4.5 liters but weight unchanged overnight,  CVP 15  Co-ox 67% . Feeling better but still feels bloated and legs tight. RUE improving. Less pain   Inpatient Medications    Scheduled Meds: . apixaban  5 mg Oral BID  . aspirin EC  81 mg Oral Daily  . atorvastatin  40 mg Oral q1800  . insulin aspart  0-20 Units Subcutaneous TID WC  . insulin aspart  0-5 Units Subcutaneous QHS  . insulin aspart  5 Units Subcutaneous TID WC  . insulin glargine  8 Units Subcutaneous Daily  . lactulose  30 g Oral BID  . levothyroxine  50 mcg Oral QAC breakfast  . metolazone  5 mg Oral BID  . potassium chloride  40 mEq Oral TID  . pregabalin  100 mg Oral BID  . rifaximin  550 mg Oral BID  . spironolactone  25 mg Oral Daily   Continuous Infusions: . furosemide (LASIX) infusion 30 mg/hr (05/18/17 0946)  . milrinone 0.25 mcg/kg/min (05/18/17 0946)   PRN Meds: acetaminophen, magnesium hydroxide, ondansetron (ZOFRAN) IV, sodium chloride flush, traMADol   Vital Signs    Vitals:   05/17/17 2025 05/17/17 2345 05/18/17 0421 05/18/17 0740  BP: (!) 108/59 105/65 108/68 (!) 98/56  Pulse: 95 97 (!) 110 67  Resp: 17 18 20 13   Temp: (!) 97.3 F (36.3 C) 98.3 F (36.8 C) 98.3 F (36.8 C) 98.4 F (36.9 C)  TempSrc: Oral Oral Oral Oral  SpO2: 98%     Weight:   100.9 kg (222 lb 6.4 oz)   Height:        Intake/Output Summary (Last 24 hours) at 05/18/17 1048 Last data filed at 05/18/17 0800  Gross per 24 hour  Intake          1568.41 ml  Output             4150 ml  Net         -2581.59 ml   Filed Weights   05/16/17 0343 05/17/17 0354 05/18/17 0421  Weight: 104.6 kg (230 lb 9.6 oz) 101.1 kg (222 lb 12.8 oz) 100.9  kg (222 lb 6.4 oz)    Physical Exam   CVP 15 General:  Sitting in chair NAD  HEENT: normal Neck: supple. JVP to jaw  Carotids 2+ bilat; no bruits. No lymphadenopathy or thryomegaly appreciated. Cor: PMI nondisplaced. IRR iRR. 2/6 TR Lungs: clear no wheeze  Abdomen: obese soft NT + mildly distended. No hepatosplenomegaly. No bruits or masses. Good bowel sounds. Extremities: no cyanosis, clubbing, rash, R and LLE unna boots. 2-3+ edema  RUE induration upper forearm from IV infiltrate improvedimproving .+ LUE PICC Neuro: alert & oriented x 3, cranial nerves grossly intact. moves all 4 extremities w/o difficulty. Affect pleasant   Labs    Chemistry  Recent Labs Lab 05/16/17 0454 05/17/17 0433 05/18/17 0342  NA 125* 128* 128*  K 3.9 3.7 3.8  CL 86* 88* 85*  CO2 32 32 34*  GLUCOSE 393* 308* 224*  BUN 50* 49* 50*  CREATININE 1.32* 1.32* 1.37*  CALCIUM 8.5* 8.6* 8.9  GFRNONAA 41* 41* 40*  GFRAA 48* 48* 46*  ANIONGAP 7 8 9  Hematology  Recent Labs Lab 05/16/17 0454 05/17/17 0433 05/18/17 0342  WBC 4.9 4.9 5.4  RBC 4.19 4.25 4.31  HGB 11.6* 11.9* 12.2  HCT 36.3 37.3 37.2  MCV 86.6 87.8 86.3  MCH 27.7 28.0 28.3  MCHC 32.0 31.9 32.8  RDW 17.6* 17.8* 17.2*  PLT 204 227 217    Cardiac Enzymes No results for input(s): TROPONINI in the last 168 hours.  No results for input(s): TROPIPOC in the last 168 hours.   BNP No results for input(s): BNP, PROBNP in the last 168 hours.   Radiology    No results found. Telemetry    A fib 90-100s. Personally reviewed.    Patient Profile     Christine Knight is a 66 y.o. female w/ PMHx significant for morbid obesity, systolic HF due to ICM (10/12 EF 25%; Echo 6/17 EF 15-20%), CAD with h/o NSTEMI s/p CABG Oct 2012, CKD III, severe pulmonary hypertension, PAF, dietary noncompliance, and poorly controlled DM 2, admitted 6/30 with acute on chronic HFrEF and wt gain.  Assessment & Plan    1. Acute on chronic biventricular HF  (R>>L):  EF 15-20% 04/14/2016.  Presented 6/30 with marked volume overload and wt gain (up over 30 lbs since 04/16/17) in the setting of eating out frequently and not reporting wts/symptoms.   CO-OX 69%. Continue milrinone 0.375 mcg + lasix drip at 30 mg per hour + metolazone 5 mg twice a day. Brisk diuresis. Negative over 4 liters. Weight trending down. Most recent discharge weight 208 pounds. Prior to admit she was taking torsemide 80/80 mg twice a day.  --Remains markedly overloaded.  -- Continues to diurese well but weight still up. Continue current regimen. Co-ox looks good. -- Continue spiro  - No  blocker/acei/arb/arni due to soft bp's and low output. --Continue unna boots RLE and LLE. Will need to change tomorrow  2.  RUE IV infiltration:  7/1 [place] was neg for dvt.   -- R arm still tender but improving -- Continue elevation and warm compresses.   3.  CAD/Chest pressure/elevated troponin:  Mild trop elevation @ 0.05.   -- No further CP. Likely due to HF .  Known to have severe native CAD with patent grafts on cath 12/2015.   -- Cont asa/statin.   4.  PAF:   -- Remains in afib - Rate improved with decrease  milrinone to 0.25 -- Continue eliquis 5 mg twice a day.  (CHA2DS2VASc = 5). -- Amio stopped due to fatigue. Can restart as needed  5.  CKD III:   --Stable. Creatinine 1.37  Follow BMET daily.   6.  DM II:  Cont SSI. Diabetes Coordinator appreciated.     7.  OSA:  Severe OSA on sleep study 12/25/16.  Needs CPAP.  8.  Morbid obesity: Body mass index is 35.9 kg/m.  Needs outpt dietary consultation.  9. PVCs -- Keep k> 4.0 mg > 2.0  10. Hyponatremia: --sodium stable 128. Continue restrict free H2O.  11. Hepatic encephalopathy -- Ammonia 49 on 05/15/2017. Continue  Lactulose and xifaxin. Recheck ammonia in am.   12. DNR  Signed, Arvilla Meres, MD  05/18/2017, 10:48 AM

## 2017-05-19 LAB — CBC
HCT: 38 % (ref 36.0–46.0)
HEMOGLOBIN: 12.4 g/dL (ref 12.0–15.0)
MCH: 28.4 pg (ref 26.0–34.0)
MCHC: 32.6 g/dL (ref 30.0–36.0)
MCV: 87.2 fL (ref 78.0–100.0)
PLATELETS: 213 10*3/uL (ref 150–400)
RBC: 4.36 MIL/uL (ref 3.87–5.11)
RDW: 17 % — ABNORMAL HIGH (ref 11.5–15.5)
WBC: 4.3 10*3/uL (ref 4.0–10.5)

## 2017-05-19 LAB — GLUCOSE, CAPILLARY
GLUCOSE-CAPILLARY: 258 mg/dL — AB (ref 65–99)
GLUCOSE-CAPILLARY: 287 mg/dL — AB (ref 65–99)
Glucose-Capillary: 181 mg/dL — ABNORMAL HIGH (ref 65–99)
Glucose-Capillary: 87 mg/dL (ref 65–99)

## 2017-05-19 LAB — MAGNESIUM: MAGNESIUM: 2.1 mg/dL (ref 1.7–2.4)

## 2017-05-19 LAB — BASIC METABOLIC PANEL
Anion gap: 7 (ref 5–15)
BUN: 52 mg/dL — ABNORMAL HIGH (ref 6–20)
CHLORIDE: 85 mmol/L — AB (ref 101–111)
CO2: 35 mmol/L — ABNORMAL HIGH (ref 22–32)
Calcium: 8.9 mg/dL (ref 8.9–10.3)
Creatinine, Ser: 1.34 mg/dL — ABNORMAL HIGH (ref 0.44–1.00)
GFR, EST AFRICAN AMERICAN: 47 mL/min — AB (ref >60)
GFR, EST NON AFRICAN AMERICAN: 41 mL/min — AB (ref >60)
Glucose, Bld: 175 mg/dL — ABNORMAL HIGH (ref 65–99)
POTASSIUM: 4.2 mmol/L (ref 3.5–5.1)
SODIUM: 127 mmol/L — AB (ref 135–145)

## 2017-05-19 LAB — COOXEMETRY PANEL
CARBOXYHEMOGLOBIN: 1.6 % — AB (ref 0.5–1.5)
METHEMOGLOBIN: 1 % (ref 0.0–1.5)
O2 SAT: 62.4 %
Total hemoglobin: 13.2 g/dL (ref 12.0–16.0)

## 2017-05-19 LAB — AMMONIA: AMMONIA: 48 umol/L — AB (ref 9–35)

## 2017-05-19 MED ORDER — ACETAZOLAMIDE 250 MG PO TABS
250.0000 mg | ORAL_TABLET | Freq: Two times a day (BID) | ORAL | Status: DC
  Administered 2017-05-19 – 2017-05-27 (×18): 250 mg via ORAL
  Filled 2017-05-19 (×19): qty 1

## 2017-05-19 NOTE — Progress Notes (Signed)
Inpatient Diabetes Program Recommendations  AACE/ADA: New Consensus Statement on Inpatient Glycemic Control (2015)  Target Ranges:  Prepandial:   less than 140 mg/dL      Peak postprandial:   less than 180 mg/dL (1-2 hours)      Critically ill patients:  140 - 180 mg/dL   Results for Christine Knight, Christine Knight (MRN [id number]) as of 05/19/2017 10:49  Ref. Range 05/18/2017 07:37 05/18/2017 12:28 05/18/2017 16:08 05/18/2017 21:49 05/19/2017 09:03  Glucose-Capillary Latest Ref Range: 65 - 99 mg/dL 409195 (H) 811232 (H) 914291 (H) 148 (H) 181 (H)   Review of Glycemic Control  Current orders for Inpatient glycemic control: Lantus 8 units daily, Novolog 5 units TID with meals, Novolog 0-20 units TID with meals, Novolog 0-5 units QHS  Inpatient Diabetes Program Recommendations: Correction (SSI): Noted patient refused correction twice on 05/18/17 (8am and 12pm).  Please encourage patient to take Novolog as ordered. Insulin - Meal Coverage: Noted patient refused Novolog meal coverage at 8am on 05/18/17.  Thanks, Orlando PennerMarie Kelley Knoth, RN, MSN, CDE Diabetes Coordinator Inpatient Diabetes Program [phone] (Team Pager from 8am to 5pm)

## 2017-05-19 NOTE — Progress Notes (Signed)
Orthopedic Tech Progress Note Patient Details:  Christine Knight 09/29/1951 [id number]  Ortho Devices Type of Ortho Device: Unna boot Ortho Device/Splint Location: applied unna boots to pt Bilateral legs. pt tolerated unna boot application very well.    Bilateral Ortho Device/Splint Interventions: Application   Alvina ChouWilliams, Jansel Vonstein C 05/19/2017, 4:45 PM

## 2017-05-19 NOTE — Progress Notes (Signed)
Advanced HF Team Progress Note  Patient Name: Christine Knight Date of Encounter: 05/19/2017  Primary Cardiologist: D. Haygen Zebrowski, MD   Subjective   On 7/1/  RUE IV infiltrate.   Negative 1.9 L. Weight down 1 lb on lasix gtt at 30 mg/hr + metolazone.   Feeling OK. Only a small knot remains on RLE from IV infiltrate. Main complaint is fatigue from frequent urination with diuresis  CVP 13-14. Coox 62.4% on milrinone 0.25.   Inpatient Medications    Scheduled Meds: . apixaban  5 mg Oral BID  . aspirin EC  81 mg Oral Daily  . atorvastatin  40 mg Oral q1800  . insulin aspart  0-20 Units Subcutaneous TID WC  . insulin aspart  0-5 Units Subcutaneous QHS  . insulin aspart  5 Units Subcutaneous TID WC  . insulin glargine  8 Units Subcutaneous Daily  . lactulose  30 g Oral BID  . levothyroxine  50 mcg Oral QAC breakfast  . metolazone  5 mg Oral BID  . potassium chloride  40 mEq Oral TID  . pregabalin  100 mg Oral BID  . rifaximin  550 mg Oral BID  . spironolactone  25 mg Oral Daily   Continuous Infusions: . furosemide (LASIX) infusion 30 mg/hr (05/18/17 2359)  . milrinone 0.25 mcg/kg/min (05/18/17 2115)   PRN Meds: acetaminophen, magnesium hydroxide, ondansetron (ZOFRAN) IV, sodium chloride flush, traMADol   Vital Signs    Vitals:   05/18/17 1928 05/18/17 2300 05/19/17 0314 05/19/17 0635  BP: 103/64 97/61 109/65   Pulse: (!) 111 90    Resp: 19 15 15  (!) 23  Temp: 97.7 F (36.5 C) (!) 97.4 F (36.3 C) (!) 97.5 F (36.4 C)   TempSrc: Oral Oral Oral   SpO2:  97% 96%   Weight:    221 lb 12.8 oz (100.6 kg)  Height:        Intake/Output Summary (Last 24 hours) at 05/19/17 0713 Last data filed at 05/19/17 0600  Gross per 24 hour  Intake          1716.31 ml  Output             3625 ml  Net         -1908.69 ml   Filed Weights   05/17/17 0354 05/18/17 0421 05/19/17 0635  Weight: 222 lb 12.8 oz (101.1 kg) 222 lb 6.4 oz (100.9 kg) 221 lb 12.8 oz (100.6 kg)     Physical Exam   CVP 13-14 General: Seated in chair, NAD.  HEENT: Normal Neck: Supple. JVP to jaw. Carotids 2+ bilat; no bruits. No thyromegaly or nodule noted. Cor: PMI nondisplaced. Irr, Irr. 2/6 TR.  Lungs: Distant, clear.  Abdomen: Soft, non-tender,  + mildly distended, no HSM. No bruits or masses. +BS  Extremities: No cyanosis, clubbing, or rash. BLE with 2-3+ edema and + unna boots. + LUE PICC. RUE induration upper forearm from IV infiltrating much improved.  Neuro: Alert & orientedx3, cranial nerves grossly intact. moves all 4 extremities w/o difficulty. Affect pleasant   Labs    Chemistry  Recent Labs Lab 05/17/17 0433 05/18/17 0342 05/19/17 0333  NA 128* 128* 127*  K 3.7 3.8 4.2  CL 88* 85* 85*  CO2 32 34* 35*  GLUCOSE 308* 224* 175*  BUN 49* 50* 52*  CREATININE 1.32* 1.37* 1.34*  CALCIUM 8.6* 8.9 8.9  GFRNONAA 41* 40* 41*  GFRAA 48* 46* 47*  ANIONGAP 8 9 7  Hematology  Recent Labs Lab 05/17/17 0433 05/18/17 0342 05/19/17 0333  WBC 4.9 5.4 4.3  RBC 4.25 4.31 4.36  HGB 11.9* 12.2 12.4  HCT 37.3 37.2 38.0  MCV 87.8 86.3 87.2  MCH 28.0 28.3 28.4  MCHC 31.9 32.8 32.6  RDW 17.8* 17.2* 17.0*  PLT 227 217 213    Cardiac Enzymes No results for input(s): TROPONINI in the last 168 hours.  No results for input(s): TROPIPOC in the last 168 hours.   BNP No results for input(s): BNP, PROBNP in the last 168 hours.   Radiology    No results found. Telemetry    A fib 90-100s. Personally reviewed.    Patient Profile     Christine Knight is a 66 y.o. female w/ PMHx significant for morbid obesity, systolic HF due to ICM (10/12 EF 25%; Echo 6/17 EF 15-20%), CAD with h/o NSTEMI s/p CABG Oct 2012, CKD III, severe pulmonary hypertension, PAF, dietary noncompliance, and poorly controlled DM 2, admitted 6/30 with acute on chronic HFrEF and wt gain.  Assessment & Plan    1. Acute on chronic biventricular HF (R>>L):  EF 15-20% 04/14/2016.  Presented 6/30 with  marked volume overload and wt gain (up over 30 lbs since 04/16/17) in the setting of eating out frequently and not reporting wts/symptoms.   -- Coox 62.5% on milrinone 0.25 mcg/kg/min + lasix gtt at 30 mg/hr + metolazone 5 mg BID.  -- Remains markedly overloaded. Most recent discharge weight 208 lbs.  -- Add acetazolamide 250 mg BID with only marginal diuresis overnight.  -- Continue spiro  -- No  blocker/acei/arb/arni due to soft bp's and low output. --Continue unna boots RLE and LLE. Need to be changed today.   2.  RUE IV infiltration:  7/1 US was neg for dvt.   -- R arm tender but continues to improve. -- Continue elevation and warm compresses.   3.  CAD/Chest pressure/elevated troponin:  Mild trop elevation @ 0.05.   -- Denies any further CP. Likely due to HF .  Known to have severe native CAD with patent grafts on cath 12/2015.   -- Cont ASA and statin.   4.  PAF:   -- Remains in afib - Rate improved with milrinone decreased to 0.25. Stable. -- Continue eliquis 5 mg twice a day.  (CHA2DS2VASc = 5). -- Amio stopped due to fatigue. Can restart as needed.   5.  CKD III:   -- Stable despite aggressive diuresis. Continue to follow.   6.  DM II:   - Cont SSI. Appreciate diabetes coordinator input.   7.  OSA:  Severe OSA on sleep study 12/25/16.   -- Needs CPAP.   8.  Morbid obesity: Body mass index is 35.8 kg/m. -- Needs outpatient dietary consultation.   9. PVCs -- Goal K > 4.0 and Mg > 2.0 -- Recheck Mg today.   10. Hyponatremia: -- Relatively stable at 127. Continue to follow -- Continue free water restriction.   11. Hepatic encephalopathy -- Ammonia 48 this am. Continue Lactulose and xifaxin.   12. DNR  Graciella FreerMichael Andrew Tillery, PA-C  05/19/2017, 7:13 AM    Advanced Heart Failure Team Pager [phone] (M-F; 7a - 4p)  Please contact [facility] for night-coverage after hours (4p -7a ) and weekends on [email or web address]  Patient seen and examined with the above-signed  Advanced Practice Provider and/or Housestaff. I personally reviewed laboratory data, imaging studies and relevant notes. I independently examined the patient and formulated the important aspects  of the plan. I have edited the note to reflect any of my changes or salient points. I have personally discussed the plan with the patient and/or family.  Minimal weight change despite inotropic support and high dose diuretics. Agree with adding diamox. If that doesn;t work may need to add low-dose dopamine.   Arvilla Meres, MD  11:20 AM

## 2017-05-20 LAB — BASIC METABOLIC PANEL
ANION GAP: 8 (ref 5–15)
BUN: 51 mg/dL — AB (ref 6–20)
CHLORIDE: 84 mmol/L — AB (ref 101–111)
CO2: 34 mmol/L — AB (ref 22–32)
Calcium: 8.3 mg/dL — ABNORMAL LOW (ref 8.9–10.3)
Creatinine, Ser: 1.34 mg/dL — ABNORMAL HIGH (ref 0.44–1.00)
GFR calc Af Amer: 47 mL/min — ABNORMAL LOW (ref >60)
GFR calc non Af Amer: 41 mL/min — ABNORMAL LOW (ref >60)
GLUCOSE: 246 mg/dL — AB (ref 65–99)
POTASSIUM: 3.3 mmol/L — AB (ref 3.5–5.1)
Sodium: 126 mmol/L — ABNORMAL LOW (ref 135–145)

## 2017-05-20 LAB — COOXEMETRY PANEL
Carboxyhemoglobin: 2.2 % — ABNORMAL HIGH (ref 0.5–1.5)
Methemoglobin: 0.9 % (ref 0.0–1.5)
O2 SAT: 66.9 %
Total hemoglobin: 12 g/dL (ref 12.0–16.0)

## 2017-05-20 LAB — CBC
HEMATOCRIT: 36.2 % (ref 36.0–46.0)
HEMOGLOBIN: 11.6 g/dL — AB (ref 12.0–15.0)
MCH: 28.1 pg (ref 26.0–34.0)
MCHC: 32 g/dL (ref 30.0–36.0)
MCV: 87.7 fL (ref 78.0–100.0)
PLATELETS: 221 10*3/uL (ref 150–400)
RBC: 4.13 MIL/uL (ref 3.87–5.11)
RDW: 17.1 % — ABNORMAL HIGH (ref 11.5–15.5)
WBC: 4.2 10*3/uL (ref 4.0–10.5)

## 2017-05-20 LAB — GLUCOSE, CAPILLARY
GLUCOSE-CAPILLARY: 264 mg/dL — AB (ref 65–99)
GLUCOSE-CAPILLARY: 115 mg/dL — AB (ref 65–99)
Glucose-Capillary: 170 mg/dL — ABNORMAL HIGH (ref 65–99)
Glucose-Capillary: 205 mg/dL — ABNORMAL HIGH (ref 65–99)

## 2017-05-20 MED ORDER — POTASSIUM CHLORIDE CRYS ER 20 MEQ PO TBCR
40.0000 meq | EXTENDED_RELEASE_TABLET | Freq: Once | ORAL | Status: AC
  Administered 2017-05-20: 40 meq via ORAL
  Filled 2017-05-20: qty 2

## 2017-05-20 NOTE — Care Management Note (Signed)
Case Management Note  Patient Details  Name: Christine BabeBarbara Knight MRN: [id number] Date of Birth: 1951-03-15  Subjective/Objective:    Pt admitted with HF                Action/Plan:   PTA from home.  Pt is already active with para medicine clinic.  CM submitted benefit check for eliquis   Expected Discharge Date:                  Expected Discharge Plan:  Home w Home Health Services  In-House Referral:     Discharge planning Services  CM Consult  Post Acute Care Choice:    Choice offered to:     DME Arranged:    DME Agency:     HH Arranged:    HH Agency:     Status of Service:     If discussed at [employer] Meetings, dates discussed:    Additional Comments: 05/20/2017 Discussed in LOS 7/10 - pt remains appropriate for continued stay.  Pt remains on milrinone and IV lasix drip - may require switch to dobutamine  05/13/17 CM confirmed with [facility] that prescription can be filled at discharge within normal operation hours  CM provided pt eliquis free 30 day card - benefit check submitted - pt has $0 copay and prior auth is not needed.  Pt states she uses [facility] and knows to get medication from [facility] on [address] if she discharges after 6pm during the week or on the weekend.  Pt is independent at home and states she is independently walking in room. Pt educated on daily weights and low salt diet restrictions.   Cherylann ParrClaxton, Alaena Strader S, RN 05/20/2017, 9:36 AM

## 2017-05-20 NOTE — Progress Notes (Signed)
PT Cancellation Note  Patient Details Name: Christine BabeBarbara Chinn MRN: [id number] DOB: 30-Apr-1951   Cancelled Treatment:    Reason Eval/Treat Not Completed: Fatigue/lethargy limiting ability to participate. Pt asleep in chair upon PT arrival. Was not able to rouse enough to participate with therapy at this time. Offered to come back another time and pt slowly nodded "yes". Prior to PT exiting room, pt's UE's were repositioned on pillows as her RUE was dangling off armrest of chair. Will continue to follow.    Marylynn PearsonLaura D Filemon Breton 05/20/2017, 2:54 PM   Conni SlipperLaura Rawleigh Rode, PT, DPT Acute Rehabilitation Services Pager: [phone]

## 2017-05-20 NOTE — Progress Notes (Signed)
Advanced HF Team Progress Note  Patient Name: Christine Knight Date of Encounter: 05/20/2017  Primary Cardiologist: D. Roxie Kreeger, MD   Subjective   On 7/1/  RUE IV infiltrate.   Negative 1.5 L. Down 2 more lbs. Remains on lasix gtt 30, metolazone 5 mg BID, and diamox 250 mg BID added 05/19/17   Feeling better. Unna boots changed yesterday. Breathing improved.  Sleeping in chair with having to use the bathroom so often.   CVP 12-13. Coox 66.9% on milrinone 0.25.   Inpatient Medications    Scheduled Meds: . acetaZOLAMIDE  250 mg Oral BID  . apixaban  5 mg Oral BID  . aspirin EC  81 mg Oral Daily  . atorvastatin  40 mg Oral q1800  . insulin aspart  0-20 Units Subcutaneous TID WC  . insulin aspart  0-5 Units Subcutaneous QHS  . insulin aspart  5 Units Subcutaneous TID WC  . insulin glargine  8 Units Subcutaneous Daily  . lactulose  30 g Oral BID  . levothyroxine  50 mcg Oral QAC breakfast  . metolazone  5 mg Oral BID  . potassium chloride  40 mEq Oral TID  . pregabalin  100 mg Oral BID  . rifaximin  550 mg Oral BID  . spironolactone  25 mg Oral Daily   Continuous Infusions: . furosemide (LASIX) infusion 30 mg/hr (05/20/17 0308)  . milrinone 0.25 mcg/kg/min (05/19/17 2031)   PRN Meds: acetaminophen, magnesium hydroxide, ondansetron (ZOFRAN) IV, sodium chloride flush, traMADol   Vital Signs    Vitals:   05/19/17 1600 05/19/17 1926 05/19/17 2322 05/20/17 0320  BP: 100/60 90/60 (!) 96/54 (!) 99/54  Pulse:  94 90 93  Resp:  18 20 16   Temp:  98.1 F (36.7 C) 98.1 F (36.7 C) 97.6 F (36.4 C)  TempSrc:  Oral Oral Oral  SpO2:  97% 98% 97%  Weight:    219 lb 9.6 oz (99.6 kg)  Height:        Intake/Output Summary (Last 24 hours) at 05/20/17 0736 Last data filed at 05/20/17 0400  Gross per 24 hour  Intake           1798.2 ml  Output             3350 ml  Net          -1551.8 ml   Filed Weights   05/18/17 0421 05/19/17 0635 05/20/17 0320  Weight: 222 lb 6.4 oz  (100.9 kg) 221 lb 12.8 oz (100.6 kg) 219 lb 9.6 oz (99.6 kg)    Physical Exam   CVP 12-13 General: Fatigued appearing. No resp difficulty. HEENT: Normal Neck: Supple. Thick JVP elevated to jaw. Carotids 2+ bilat; no bruits. No thyromegaly or nodule noted. Cor: PMI nondisplaced. IRR, IRR. 2/6 TR.  Lungs: CTAB, normal effort. Abdomen: Obese, non-tender, + distended, no HSM. No bruits or masses. +BS  Extremities: No cyanosis, clubbing, or rash. BLE with 2+ edema. + unna boots. + LUE PICC. RUE induration upper forearm from IV infiltrate much improved. Neuro: Alert & orientedx3, cranial nerves grossly intact. moves all 4 extremities w/o difficulty. Affect pleasant   Labs    Chemistry  Recent Labs Lab 05/18/17 0342 05/19/17 0333 05/20/17 0300  NA 128* 127* 126*  K 3.8 4.2 3.3*  CL 85* 85* 84*  CO2 34* 35* 34*  GLUCOSE 224* 175* 246*  BUN 50* 52* 51*  CREATININE 1.37* 1.34* 1.34*  CALCIUM 8.9 8.9 8.3*  GFRNONAA 40* 41* 41*  GFRAA 46* 47* 47*  ANIONGAP 9 7 8      Hematology  Recent Labs Lab 05/18/17 0342 05/19/17 0333 05/20/17 0300  WBC 5.4 4.3 4.2  RBC 4.31 4.36 4.13  HGB 12.2 12.4 11.6*  HCT 37.2 38.0 36.2  MCV 86.3 87.2 87.7  MCH 28.3 28.4 28.1  MCHC 32.8 32.6 32.0  RDW 17.2* 17.0* 17.1*  PLT 217 213 221    Cardiac Enzymes No results for input(s): TROPONINI in the last 168 hours.  No results for input(s): TROPIPOC in the last 168 hours.   BNP No results for input(s): BNP, PROBNP in the last 168 hours.   Radiology    No results found. Telemetry    A fib 90-100s. Personally reviewed.    Patient Profile     Christine Knight is a 66 y.o. female w/ PMHx significant for morbid obesity, systolic HF due to ICM (10/12 EF 25%; Echo 6/17 EF 15-20%), CAD with h/o NSTEMI s/p CABG Oct 2012, CKD III, severe pulmonary hypertension, PAF, dietary noncompliance, and poorly controlled DM 2, admitted 6/30 with acute on chronic HFrEF and wt gain.  Assessment & Plan    1.  Acute on chronic biventricular HF (R>>L):  EF 15-20% 04/14/2016.  Presented 6/30 with marked volume overload and wt gain (up over 30 lbs since 04/16/17) in the setting of eating out frequently and not reporting wts/symptoms.   -- Coox 66.9%  on milrinone 0.25 mcg/kg/min + lasix gtt at 30 mg/hr + metolazone 5 mg BID.  -- Volume status remains elevated but slowly improving.  -- Continue acetazolamide 250 mg BID. Weight down another 2 lbs.  -- Continue spiro  -- No  blocker/acei/arb/arni due to soft bp's and low output. --Continue unna boots RLE and LLE. Changed 05/19/17  2.  RUE IV infiltration:  7/1 [place] was neg for dvt.   -- R arm continue to improve. Remains slightly tender over old IV site.  -- Continue elevation and warm compresses.   3.  CAD/Chest pressure/elevated troponin:  Mild trop elevation @ 0.05.   -- No further CP. Known to have severe native CAD with patent grafts on cath 12/2015.   -- Cont ASA and statin.   4.  PAF:   -- Remains in afib - Rate stable.  -- Continue eliquis 5 mg twice a day.  (CHA2DS2VASc = 5). -- Amio stopped due to fatigue. Can restart as needed. No change  5.  CKD III:   -- Remains stable with diuresis. Continue to follow.   6.  DM II:   - Cont SSI. Appreciate diabetes coordinator input. No change.   7.  OSA:  Severe OSA on sleep study 12/25/16.   -- Needs CPAP.   8.  Morbid obesity: Body mass index is 35.44 kg/m. -- Needs outpatient dietary consultation. No change.   9. PVCs -- Goal K > 4.0 and Mg > 2.0 -- Mg stable yesterday.  -- Increase K supp today.   10. Hyponatremia: -- 126 this am. Continue to follow and free water restrict.  -- Can consider tolvaptan if gets lower.  -15 11. Hepatic encephalopathy -- Ammonia 48 05/19/17. Continue lactulose and xifaxin.    12. DNR  Diuresing and weight coming down.  Still up around 10-15 lbs. Continue current course. May need to add dopamine, but pt already c/o frequent urination and little rest.  Renal  function stable.   Christine Freer, PA-C  05/20/2017, 7:36 AM    Advanced Heart Failure Team Pager [phone] (  M-F; 7a - 4p)  Please contact [facility] for night-coverage after hours (4p -7a ) and weekends on [email or web address]  Patient seen and examined with the above-signed Advanced Practice Provider and/or Housestaff. I personally reviewed laboratory data, imaging studies and relevant notes. I independently examined the patient and formulated the important aspects of the plan. I have edited the note to reflect any of my changes or salient points. I have personally discussed the plan with the patient and/or family.  CVP remains elevated and dyspneic with minimal exertion. Continues with milrinone and high dose lasix gtt. Weight now seems to be improving more with addition of diamox.  Still about 19 pounds above baseline weight. Will continue high-dose diuretics and inotropic support. Remains in AF but rate better on IV amio.  Arvilla MeresBensimhon, Kye Hedden, MD  7:35 PM

## 2017-05-21 LAB — COOXEMETRY PANEL
Carboxyhemoglobin: 2 % — ABNORMAL HIGH (ref 0.5–1.5)
Methemoglobin: 0.8 % (ref 0.0–1.5)
O2 Saturation: 66.1 %
Total hemoglobin: 12.1 g/dL (ref 12.0–16.0)

## 2017-05-21 LAB — BASIC METABOLIC PANEL WITH GFR
Anion gap: 9 (ref 5–15)
BUN: 59 mg/dL — ABNORMAL HIGH (ref 6–20)
CO2: 36 mmol/L — ABNORMAL HIGH (ref 22–32)
Calcium: 8.8 mg/dL — ABNORMAL LOW (ref 8.9–10.3)
Chloride: 84 mmol/L — ABNORMAL LOW (ref 101–111)
Creatinine, Ser: 1.43 mg/dL — ABNORMAL HIGH (ref 0.44–1.00)
GFR calc Af Amer: 43 mL/min — ABNORMAL LOW
GFR calc non Af Amer: 38 mL/min — ABNORMAL LOW
Glucose, Bld: 182 mg/dL — ABNORMAL HIGH (ref 65–99)
Potassium: 3.5 mmol/L (ref 3.5–5.1)
Sodium: 129 mmol/L — ABNORMAL LOW (ref 135–145)

## 2017-05-21 LAB — GLUCOSE, CAPILLARY
GLUCOSE-CAPILLARY: 225 mg/dL — AB (ref 65–99)
GLUCOSE-CAPILLARY: 82 mg/dL (ref 65–99)
GLUCOSE-CAPILLARY: 162 mg/dL — AB (ref 65–99)
Glucose-Capillary: 194 mg/dL — ABNORMAL HIGH (ref 65–99)
Glucose-Capillary: 193 mg/dL — ABNORMAL HIGH (ref 65–99)

## 2017-05-21 LAB — CBC
HEMATOCRIT: 37 % (ref 36.0–46.0)
Hemoglobin: 11.9 g/dL — ABNORMAL LOW (ref 12.0–15.0)
MCH: 28 pg (ref 26.0–34.0)
MCHC: 32.2 g/dL (ref 30.0–36.0)
MCV: 87.1 fL (ref 78.0–100.0)
Platelets: 227 10*3/uL (ref 150–400)
RBC: 4.25 MIL/uL (ref 3.87–5.11)
RDW: 17 % — AB (ref 11.5–15.5)
WBC: 4 10*3/uL (ref 4.0–10.5)

## 2017-05-21 LAB — MAGNESIUM: Magnesium: 2.3 mg/dL (ref 1.7–2.4)

## 2017-05-21 MED ORDER — AMIODARONE HCL IN DEXTROSE 360-4.14 MG/200ML-% IV SOLN
30.0000 mg/h | INTRAVENOUS | Status: DC
  Administered 2017-05-21 (×2): 30 mg/h via INTRAVENOUS
  Filled 2017-05-21 (×2): qty 200

## 2017-05-21 NOTE — Progress Notes (Signed)
Pt feeling fatigue, stated she felt weak and  like she was taking the wrong medication. Pt VVS, A/O, getting up to use bsc but moving slower than usual. Amio gtt restarted today. Per Bensimhon  MD note gtt can be stopped as needed. MD Hochrein paged to possibly stop gtt but no new orders received. Will continue to monitor.

## 2017-05-21 NOTE — Progress Notes (Signed)
Physical Therapy Treatment Patient Details Name: Christine Knight MRN: [id number] DOB: [birth year] Today's Date: 05/21/2017    History of Present Illness Patient is a 66 y/o female who presents with heart failure and volume overload. CXR-Small left pleural effusion. Recently admitted 4/24-5/3 for fluid overload. PMH includes morbid obesity, systolic HF due to ICM, CAD with hx of NSTEMI s/p CABG Oct 2012, pulmonary HTN, DM.    PT Comments    Pt admitted with above diagnosis. Pt currently with functional limitations due to balance and endurance deficits. Pt was able to ambulate with slight DOE 2/4.  VSS. No device needed with no LOB.  Will continue to follow acutely.   Pt will benefit from skilled PT to increase their independence and safety with mobility to allow discharge to the venue listed below.     Follow Up Recommendations  No PT follow up;Supervision for mobility/OOB     Equipment Recommendations  None recommended by PT    Recommendations for Other Services       Precautions / Restrictions Precautions Precautions: Fall Restrictions Weight Bearing Restrictions: No    Mobility  Bed Mobility               General bed mobility comments: Sitting EOB eating breakfast on arrival.    Transfers Overall transfer level: Needs assistance Equipment used: None Transfers: Sit to/from Stand Sit to Stand: Modified independent (Device/Increase time)         General transfer comment: Min guard for safety.   Ambulation/Gait Ambulation/Gait assistance: Supervision Ambulation Distance (Feet): 425 Feet Assistive device: None Gait Pattern/deviations: Step-through pattern;Decreased stride length Gait velocity: decreased   General Gait Details: No rest breaks this session, slow steady gait with HR max 141; maintained mostly in 120's, noted DOE 2/4   Stairs            Wheelchair Mobility    Modified Rankin (Stroke Patients Only)       Balance Overall balance  assessment: Needs assistance Sitting-balance support: Feet supported;No upper extremity supported Sitting balance-[name] Scale: Normal     Standing balance support: No upper extremity supported;During functional activity Standing balance-[name] Scale: Good Standing balance comment: able to walk without device and maintain semi-straight path despite conversation and environmental scanning                            Cognition Arousal/Alertness: Awake/alert Behavior During Therapy: WFL for tasks assessed/performed Overall Cognitive Status: Within Functional Limits for tasks assessed                                        Exercises General Exercises - Lower Extremity Ankle Circles/Pumps: AROM;Both;5 reps;Seated Long Arc Quad: AROM;Both;5 reps;Seated    General Comments General comments (skin integrity, edema, etc.): VSS with activity.  HR occasionally up to 140 bpm for brief seconds and then back to 120's.       Pertinent Vitals/Pain Pain Assessment: No/denies pain    Home Living                      Prior Function            PT Goals (current goals can now be found in the care plan section) Progress towards PT goals: Progressing toward goals    Frequency    Min 3X/week      PT  Plan Current plan remains appropriate    Co-evaluation              AM-PAC PT "6 Clicks" Daily Activity  Outcome Measure  Difficulty turning over in bed (including adjusting bedclothes, sheets and blankets)?: None Difficulty moving from lying on back to sitting on the side of the bed? : None Difficulty sitting down on and standing up from a chair with arms (e.g., wheelchair, bedside commode, etc,.)?: None Help needed moving to and from a bed to chair (including a wheelchair)?: None Help needed walking in hospital room?: None Help needed climbing 3-5 steps with a railing? : A Little 6 Click Score: 23    End of Session Equipment Utilized During  Treatment: Gait belt Activity Tolerance: Patient limited by fatigue Patient left: in chair;with call bell/phone within reach Nurse Communication: Mobility status PT Visit Diagnosis: Unsteadiness on feet (R26.81)     Time: [id number] PT Time Calculation (min) (ACUTE ONLY): 20 min  Charges:  $Gait Training: 8-22 mins                    G Codes:       Chalmers Iddings,PT Acute Rehabilitation [phone] (pager)    Berline Lopesawn F Brittony Billick 05/21/2017, 11:09 AM

## 2017-05-21 NOTE — Progress Notes (Signed)
Pt very fatigue, asleep sitting in chair with head down drooling. Responds to voice and touch but unable to hold conversations. Pt unable to take medications. Pt is able to wake up and responds with one word answers but then drifts back to sleep. Charge RN called to room to assess pt. MD Hochrein paged to come see patient or stop amio gtt. Md came to see pt. MD gave a verbal ok to stop amio gtt.   2300- Pt awake, a/o, watching tv. Pt stated she does not remember staff being in room trying to wake her or MD coming to see her. Will continue to monitor.

## 2017-05-21 NOTE — Progress Notes (Signed)
Advanced HF Team Progress Note  Patient Name: Christine Knight Date of Encounter: 05/21/2017  Primary Cardiologist: D. Rolinda Impson, MD   Subjective   On 7/1/  RUE IV infiltrate.   Negative 1.9L and down another 3 lbs. Remains on lasix gtt 30 mg/hr, metolazone 5 mg BID, diamox 250 mg BID.   Remains bloated and fatigued.  No orthopnea. Still SOB moving around room. Up most of the night using bathroom.   CVP 17-18 lying in bed. (Was ~ 13 yesterday sitting up in chair)  Coox 66.1% on milrinone 0.25 mcg/kg/min.   Remains ~ 15 lbs from previous baseline.   Inpatient Medications    Scheduled Meds: . acetaZOLAMIDE  250 mg Oral BID  . apixaban  5 mg Oral BID  . aspirin EC  81 mg Oral Daily  . atorvastatin  40 mg Oral q1800  . insulin aspart  0-20 Units Subcutaneous TID WC  . insulin aspart  0-5 Units Subcutaneous QHS  . insulin aspart  5 Units Subcutaneous TID WC  . insulin glargine  8 Units Subcutaneous Daily  . lactulose  30 g Oral BID  . levothyroxine  50 mcg Oral QAC breakfast  . metolazone  5 mg Oral BID  . potassium chloride  40 mEq Oral TID  . pregabalin  100 mg Oral BID  . rifaximin  550 mg Oral BID  . spironolactone  25 mg Oral Daily   Continuous Infusions: . furosemide (LASIX) infusion 30 mg/hr (05/20/17 0308)  . milrinone 0.25 mcg/kg/min (05/20/17 2121)   PRN Meds: acetaminophen, magnesium hydroxide, ondansetron (ZOFRAN) IV, sodium chloride flush, traMADol   Vital Signs    Vitals:   05/21/17 0417 05/21/17 0505 05/21/17 0640 05/21/17 0822  BP:  93/68  (!) 150/137  Pulse: 92   (!) 117  Resp: (!) 26 15  17   Temp: 98.3 F (36.8 C)   98.6 F (37 C)  TempSrc: Oral   Oral  SpO2: 90%   98%  Weight:   216 lb 4.3 oz (98.1 kg)   Height:        Intake/Output Summary (Last 24 hours) at 05/21/17 0836 Last data filed at 05/21/17 0400  Gross per 24 hour  Intake           1051.5 ml  Output             3000 ml  Net          -1948.5 ml   Filed Weights   05/19/17  0635 05/20/17 0320 05/21/17 0640  Weight: 221 lb 12.8 oz (100.6 kg) 219 lb 9.6 oz (99.6 kg) 216 lb 4.3 oz (98.1 kg)    Physical Exam   CVP 17-18 General: Fatigued appearing. No resp difficulty. HEENT: Normal Neck: Supple. Thick, JVP to jaw. Carotids 2+ bilat; no bruits. No thyromegaly or nodule noted. Cor: PMI nondisplaced. Irr, Irr. 2/6 TR.  Lungs: CTAB, normal effort. Abdomen: Soft, non-tender, non-distended, no HSM. No bruits or masses. +BS  Extremities: No cyanosis, clubbing, or rash. BLE 1-2+ edema. + Unna boots.  Neuro: Alert & orientedx3, cranial nerves grossly intact. moves all 4 extremities w/o difficulty. Affect pleasant    Labs    Chemistry  Recent Labs Lab 05/19/17 0333 05/20/17 0300 05/21/17 0423  NA 127* 126* 129*  K 4.2 3.3* 3.5  CL 85* 84* 84*  CO2 35* 34* 36*  GLUCOSE 175* 246* 182*  BUN 52* 51* 59*  CREATININE 1.34* 1.34* 1.43*  CALCIUM 8.9 8.3* 8.8*  GFRNONAA 41* 41* 38*  GFRAA 47* 47* 43*  ANIONGAP 7 8 9      Hematology  Recent Labs Lab 05/19/17 0333 05/20/17 0300 05/21/17 0423  WBC 4.3 4.2 4.0  RBC 4.36 4.13 4.25  HGB 12.4 11.6* 11.9*  HCT 38.0 36.2 37.0  MCV 87.2 87.7 87.1  MCH 28.4 28.1 28.0  MCHC 32.6 32.0 32.2  RDW 17.0* 17.1* 17.0*  PLT 213 221 227    Cardiac Enzymes No results for input(s): TROPONINI in the last 168 hours.  No results for input(s): TROPIPOC in the last 168 hours.   BNP No results for input(s): BNP, PROBNP in the last 168 hours.   Radiology    No results found. Telemetry    Personally reviewed, Afib rates 110s this am.   Patient Profile     Christine Knight is a 66 y.o. female w/ PMHx significant for morbid obesity, systolic HF due to ICM (10/12 EF 25%; Echo 6/17 EF 15-20%), CAD with h/o NSTEMI s/p CABG Oct 2012, CKD III, severe pulmonary hypertension, PAF, dietary noncompliance, and poorly controlled DM 2, admitted 6/30 with acute on chronic HFrEF and wt gain.  Assessment & Plan    1. Acute on chronic  biventricular HF (R>>L):  EF 15-20% 04/14/2016.  Presented 6/30 with marked volume overload and wt gain (up over 30 lbs since 04/16/17) in the setting of eating out frequently and not reporting wts/symptoms.   -- Coox 66.1% on milrionone 0.25 mcg/kg/min + lasix gtt at 30 mg/hr + metolazone 5 mg BID.  -- Volume status elevated but continues to improve.  -- Continue acetazolamide 250 mg BID. Weight down another 3 lbs.   -- Continue spiro  -- No  blocker/acei/arb/arni due to soft bp's and low output. --Continue unna boots RLE and LLE. Changed 05/19/17  2.  RUE IV infiltration:  7/1 [place] was neg for dvt.   -- R arm continues to improve. Remains slightly tender.  -- Continue elevation and warm compresses.   3.  CAD/Chest pressure/elevated troponin:  Mild trop elevation @ 0.05.   -- No change. Known to have severe native CAD with patent grafts on cath 12/2015. -- Cont ASA and statin.   4.  PAF:   -- Remains in afib. Rate stable.   -- Continue eliquis 5 mg twice a day.  (CHA2DS2VASc = 5). -- Amio stopped due to fatigue. Will restart low dose without bolus due to increase rate this am. Can stop as needed.   5.  CKD III:   -- 1.3 -> 1.4. Continue to follow.   6.  DM II:   - Cont SSI. Appreciate diabetes coordinator input. No change  7.  OSA:  Severe OSA on sleep study 12/25/16.   -- Needs CPAP. No change.   8.  Morbid obesity: Body mass index is 34.91 kg/m. -- Needs outpatient dietary consultation. No change.   9. PVCs -- Goal K > 4.0 and Mg > 2.0 -- More PVCs this am. Recheck Mg with increased diuresis.   10. Hyponatremia: -- 129 this am. Continue free water restrict.   11. Hepatic encephalopathy -- Ammonia 48 05/19/17.  - Continue lactulose and xifaxin.     12. DNR  Remains volume overloaded but responding well to combination of lasix gtt, milrinone, diamox, and metolazone. Continue to diurese. Check electrolytes.   Restart amio with more rapid rates this am.   Graciella FreerMichael Andrew Tillery,  PA-C  05/21/2017, 8:36 AM    Advanced Heart Failure Team Pager [phone] (  M-F; 7a - 4p)  Please contact [facility] for night-coverage after hours (4p -7a ) and weekends on [email or web address]  Patient seen and examined with the above-signed Advanced Practice Provider and/or Housestaff. I personally reviewed laboratory data, imaging studies and relevant notes. I independently examined the patient and formulated the important aspects of the plan. I have edited the note to reflect any of my changes or salient points. I have personally discussed the plan with the patient and/or family.  She remains significantly volume overloaded. She is diuresing with mirlinone support and high-dose diuretic regimen but diuresis slower than usual. Long discussion about need to limit input. Will start IV amio due to AF with RVR. Renal function stable. Continue lactulose and xifaxin for hepatic encephalopathy.   Arvilla Meres, MD  8:20 PM

## 2017-05-21 NOTE — Progress Notes (Deleted)
Discharged home by wheelchair, stable, accompanied by daughter. Discharged instructions given to pt.Belongings taken home.

## 2017-05-21 NOTE — Progress Notes (Signed)
Pt had 5 beat run of VTach. MD notified. No new orders received, will continue to monitor.

## 2017-05-21 NOTE — Progress Notes (Signed)
   Asked to come see the patient because she was not responding.  However, she was seated in a chair and aroused easily and followed commands.  She is somnolent and has been treated for hepatic encephalopathy.  However, she is nonfocal in no distress.

## 2017-05-22 LAB — BASIC METABOLIC PANEL
ANION GAP: 11 (ref 5–15)
BUN: 62 mg/dL — ABNORMAL HIGH (ref 6–20)
CALCIUM: 8.6 mg/dL — AB (ref 8.9–10.3)
CO2: 32 mmol/L (ref 22–32)
Chloride: 86 mmol/L — ABNORMAL LOW (ref 101–111)
Creatinine, Ser: 1.49 mg/dL — ABNORMAL HIGH (ref 0.44–1.00)
GFR calc non Af Amer: 36 mL/min — ABNORMAL LOW (ref >60)
GFR, EST AFRICAN AMERICAN: 41 mL/min — AB (ref >60)
Glucose, Bld: 175 mg/dL — ABNORMAL HIGH (ref 65–99)
POTASSIUM: 3 mmol/L — AB (ref 3.5–5.1)
Sodium: 129 mmol/L — ABNORMAL LOW (ref 135–145)

## 2017-05-22 LAB — COOXEMETRY PANEL
Carboxyhemoglobin: 1.5 % (ref 0.5–1.5)
METHEMOGLOBIN: 1 % (ref 0.0–1.5)
O2 Saturation: 64.3 %
TOTAL HEMOGLOBIN: 13.5 g/dL (ref 12.0–16.0)

## 2017-05-22 LAB — GLUCOSE, CAPILLARY
GLUCOSE-CAPILLARY: 206 mg/dL — AB (ref 65–99)
GLUCOSE-CAPILLARY: 239 mg/dL — AB (ref 65–99)
Glucose-Capillary: 149 mg/dL — ABNORMAL HIGH (ref 65–99)
Glucose-Capillary: 253 mg/dL — ABNORMAL HIGH (ref 65–99)

## 2017-05-22 LAB — AMMONIA: Ammonia: 41 umol/L — ABNORMAL HIGH (ref 9–35)

## 2017-05-22 LAB — CBC
HEMATOCRIT: 39.1 % (ref 36.0–46.0)
HEMOGLOBIN: 12.7 g/dL (ref 12.0–15.0)
MCH: 28.2 pg (ref 26.0–34.0)
MCHC: 32.5 g/dL (ref 30.0–36.0)
MCV: 86.7 fL (ref 78.0–100.0)
Platelets: 263 10*3/uL (ref 150–400)
RBC: 4.51 MIL/uL (ref 3.87–5.11)
RDW: 16.8 % — AB (ref 11.5–15.5)
WBC: 4.5 10*3/uL (ref 4.0–10.5)

## 2017-05-22 MED ORDER — DOPAMINE-DEXTROSE 3.2-5 MG/ML-% IV SOLN
2.5000 ug/kg/min | INTRAVENOUS | Status: DC
  Administered 2017-05-22: 2.5 ug/kg/min via INTRAVENOUS
  Filled 2017-05-22: qty 250

## 2017-05-22 MED ORDER — POTASSIUM CHLORIDE 20 MEQ/15ML (10%) PO SOLN
40.0000 meq | Freq: Once | ORAL | Status: AC
  Administered 2017-05-22: 40 meq via ORAL
  Filled 2017-05-22: qty 30

## 2017-05-22 MED ORDER — DOPAMINE-DEXTROSE 3.2-5 MG/ML-% IV SOLN: 2.5000 ug/kg/min | INTRAVENOUS | Status: DC

## 2017-05-22 NOTE — Progress Notes (Addendum)
  Check on patient and pressures remain low 80s and as low as 78.   Will start dopamine 2.5 mcg/kg/min without titrating. Will need to monitor for RVR with her underlying Afib.   Casimiro NeedleMichael [address]"Andy" August[place]illery, [place]PA-C 05/22/2017 3:46 PM

## 2017-05-22 NOTE — Progress Notes (Signed)
Patient's blood pressure in the 70s-80s/50s MAP in the 60s. Patient asymptomatic. RN paused milrinone drip and paged physician. Erin, PA made aware of situation and stated to resume patient's milrinone drip at this time. RN will continue to monitor patient.

## 2017-05-22 NOTE — Progress Notes (Signed)
Advanced HF Team Progress Note  Patient Name: Christine BabeBarbara Knight Date of Encounter: 05/22/2017  Primary Cardiologist: D. Bensimhon, MD   Subjective   On 7/1/  RUE IV infiltrate.   Negative 1.1 L and down another 2 lbs.  Remains on lasix gtt 30 mg/hr, metolazone 5 mg BID, diamox 250 mg BID.  Amio gtt resumed 05/21/17 with increase Afib rates.  Amio stopped last night with AMS and severe fatigue. Similar reaction previously.    A&O x 3 this am. Feeling good. Anxious to go home. Denies orthopnea, but slept in chair last night.   CVP 12-13  Coox 64.3% on milrinone 0.25 mcg/kg/min.   Remains > 10 from previous baseline.   Inpatient Medications    Scheduled Meds: . acetaZOLAMIDE  250 mg Oral BID  . apixaban  5 mg Oral BID  . aspirin EC  81 mg Oral Daily  . atorvastatin  40 mg Oral q1800  . insulin aspart  0-20 Units Subcutaneous TID WC  . insulin aspart  0-5 Units Subcutaneous QHS  . insulin aspart  5 Units Subcutaneous TID WC  . insulin glargine  8 Units Subcutaneous Daily  . lactulose  30 g Oral BID  . levothyroxine  50 mcg Oral QAC breakfast  . metolazone  5 mg Oral BID  . potassium chloride  40 mEq Oral TID  . pregabalin  100 mg Oral BID  . rifaximin  550 mg Oral BID  . spironolactone  25 mg Oral Daily   Continuous Infusions: . amiodarone Stopped (05/21/17 2209)  . furosemide (LASIX) infusion 30 mg/hr (05/22/17 0532)  . milrinone 0.25 mcg/kg/min (05/21/17 2240)   PRN Meds: acetaminophen, magnesium hydroxide, ondansetron (ZOFRAN) IV, sodium chloride flush, traMADol   Vital Signs    Vitals:   05/21/17 1715 05/21/17 2011 05/21/17 2321 05/22/17 0421  BP: 90/62 100/62 94/74 107/69  Pulse:   66 82  Resp: 18 16 (!) 25 20  Temp:  98.1 F (36.7 C) 98 F (36.7 C) 98 F (36.7 C)  TempSrc:  Oral Oral Oral  SpO2:  97% 100% 97%  Weight:    214 lb 8.1 oz (97.3 kg)  Height:        Intake/Output Summary (Last 24 hours) at 05/22/17 0729 Last data filed at 05/22/17 0423  Gross per 24 hour  Intake           805.51 ml  Output             2000 ml  Net         -1194.49 ml   Filed Weights   05/20/17 0320 05/21/17 0640 05/22/17 0421  Weight: 219 lb 9.6 oz (99.6 kg) 216 lb 4.3 oz (98.1 kg) 214 lb 8.1 oz (97.3 kg)    Physical Exam   CVP 12-13 General: Fatigued appearing. NAD.  HEENT: Normal Neck: Supple. Thick, JVP to jaw. Carotids 2+ bilat; no bruits. No thyromegaly or nodule noted. Cor: PMI nondisplaced. IRR, 2/6 TR Lungs: CTAB, normal effort. Abdomen: Soft, non-tender, non-distended, no HSM. No bruits or masses. +BS  Extremities: No cyanosis, clubbing, or rash. BLE 1-2+ edema to knees. + UNNA boots. Neuro: Alert & orientedx3, cranial nerves grossly intact. moves all 4 extremities w/o difficulty. Affect pleasant    Labs    Chemistry  Recent Labs Lab 05/20/17 0300 05/21/17 0423 05/22/17 0525  NA 126* 129* 129*  K 3.3* 3.5 3.0*  CL 84* 84* 86*  CO2 34* 36* 32  GLUCOSE 246* 182* 175*  BUN 51* 59* 62*  CREATININE 1.34* 1.43* 1.49*  CALCIUM 8.3* 8.8* 8.6*  GFRNONAA 41* 38* 36*  GFRAA 47* 43* 41*  ANIONGAP 8 9 11      Hematology  Recent Labs Lab 05/20/17 0300 05/21/17 0423 05/22/17 0525  WBC 4.2 4.0 4.5  RBC 4.13 4.25 4.51  HGB 11.6* 11.9* 12.7  HCT 36.2 37.0 39.1  MCV 87.7 87.1 86.7  MCH 28.1 28.0 28.2  MCHC 32.0 32.2 32.5  RDW 17.1* 17.0* 16.8*  PLT 221 227 263    Cardiac Enzymes No results for input(s): TROPONINI in the last 168 hours.  No results for input(s): TROPIPOC in the last 168 hours.   BNP No results for input(s): BNP, PROBNP in the last 168 hours.   Radiology    No results found.   Telemetry    Personally reviewed reviewed, Afib rates 110s this am.   Patient Profile     Ms.Gill is a 66 y.o. female w/ PMHx significant for morbid obesity, systolic HF due to ICM (10/12 EF 25%; Echo 6/17 EF 15-20%), CAD with h/o NSTEMI s/p CABG Oct 2012, CKD III, severe pulmonary hypertension, PAF, dietary  noncompliance, and poorly controlled DM 2, admitted 6/30 with acute on chronic HFrEF and wt gain.  Assessment & Plan    1. Acute on chronic biventricular HF (R>>L):  EF 15-20% 04/14/2016.  Presented 6/30 with marked volume overload and wt gain (up over 30 lbs since 04/16/17) in the setting of eating out frequently and not reporting wts/symptoms.   -- Coox 64.3% on milrionone 0.25 mcg/kg/min + lasix gtt at 30 mg/hr + metolazone 5 mg BID.  -- Volume status remains elevated but continues to improve.  -- Continue acetazolamide 250 mg BID. Weight down another 2 lbs.    -- Continue spiro  -- No  blocker/acei/arb/arni due to soft bp's and low output. --Continue unna boots RLE and LLE. Changed 05/19/17  2.  RUE IV infiltration:  7/1 [place] was neg for dvt.   -- R arm continues to improve.   -- Continue elevation and warm compresses.   3.  CAD/Chest pressure/elevated troponin:  Mild trop elevation @ 0.05.   -- No change. No CP. Known to have severe native CAD with patent grafts on cath 12/2015.  -- Cont ASA and statin.   4.  PAF:   -- Remains in afib. Rate improved on IV amio. Now off with recurrent severe fatigue. Rate stable.  -- Continue eliquis 5 mg twice a day.  (CHA2DS2VASc = 5).  5.  CKD III:   -- Creatinine relatively stable around 1.4 - Supp K.   6.  DM II:   - Cont SSI. Appreciate diabetes coordinator input. No change.   7.  OSA:  Severe OSA on sleep study 12/25/16.   -- Needs CPAP. No change.   8.  Morbid obesity: Body mass index is 34.62 kg/m. -- Needs outpatient dietary consultation. No change.  9. PVCs -- Goal K > 4.0 and Mg > 2.0 -- Increased burden with 5 beat NSVT overnight. Supp K. Mg stable.   10. Hyponatremia: -- Relatively stable at 129. Continue free water restrict.  11. Hepatic encephalopathy -- Ammonia 48 05/19/17. Will re-send ammonia with perceived AMS overnight. When MD came to check on patient was easily rousable.  - Continue lactulose and xifaxin.     12.  DNR  Continue to diurese. Needs 2-3 more days of diuresis.   [name], PA-C  05/22/2017, 7:29 AM  Advanced Heart Failure Team Pager [phone] (M-F; 7a - 4p)  Please contact [facility] for night-coverage after hours (4p -7a ) and weekends on [email or web address]  Patient seen and examined with the above-signed Advanced Practice Provider and/or Housestaff. I personally reviewed laboratory data, imaging studies and relevant notes. I independently examined the patient and formulated the important aspects of the plan. I have edited the note to reflect any of my changes or salient points. I have personally discussed the plan with the patient and/or family.  Continues to diurese sluggishly despite inotropic support and very high-dose diuretic regimen. Weight still about 10 pounds greater than baseline. Will continue to push diuresis. Unable to tolerate IV amio yesterday. Renal function slightly worse. K 3.0 Will supp.  Continue aggressive diuresis as long as renal function tolerates. Can add low-dose dopamine if needed but would likely accelerate AF.   Arvilla Meres, MD  8:05 AM

## 2017-05-22 NOTE — Progress Notes (Signed)
  Paged for pressures in 80s. Pt asymptomatic.  Milrinone had been stopped.   Discussed with Dr. Gala RomneyBensimhon. If remains hypotensive can start Dopamine this afternoon.  OK to resume milrinone.     Casimiro NeedleMichael [id number]Andy" [place], PA-C 05/22/2017 1:21 PM

## 2017-05-22 NOTE — Care Management Note (Signed)
Case Management Note  Patient Details  Name: Christine BabeBarbara Miramontes MRN: [id number] Date of Birth: September 03, 1951  Subjective/Objective:    Pt admitted with HF                Action/Plan:   PTA from home.  [facility] and PT.  CM submitted benefit check for eliquis   Expected Discharge Date:                  Expected Discharge Plan:  Home w Home Health Services  In-House Referral:     Discharge planning Services  CM Consult  Post Acute Care Choice:    Choice offered to:     DME Arranged:    DME Agency:     HH Arranged:    HH Agency:     Status of Service:     If discussed at [employer] Meetings, dates discussed:    Additional Comments: 05/22/2017  Discussed in LOS 05/22/17 - remains appropriate for stay .  Pt remains volume overloaded.  Per attending pt may need CPAP - requested order  05/20/17 Discussed in LOS 7/10 - pt remains appropriate for continued stay.  Pt remains on milrinone and IV lasix drip - may require switch to dobutamine  05/13/17 CM confirmed with [facility] that prescription can be filled at discharge within normal operation hours  CM provided pt eliquis free 30 day card - benefit check submitted - pt has $0 copay and prior auth is not needed.  Pt states she uses [facility] and knows to get medication from [facility] on [address] if she discharges after 6pm during the week or on the weekend.  Pt is independent at home and states she is independently walking in room. Pt educated on daily weights and low salt diet restrictions.   Cherylann ParrClaxton, Lavaeh Bau S, RN 05/22/2017, 9:14 AM

## 2017-05-23 LAB — BASIC METABOLIC PANEL
Anion gap: 9 (ref 5–15)
BUN: 65 mg/dL — AB (ref 6–20)
CHLORIDE: 83 mmol/L — AB (ref 101–111)
CO2: 34 mmol/L — AB (ref 22–32)
CREATININE: 1.61 mg/dL — AB (ref 0.44–1.00)
Calcium: 9 mg/dL (ref 8.9–10.3)
GFR calc non Af Amer: 33 mL/min — ABNORMAL LOW (ref >60)
GFR, EST AFRICAN AMERICAN: 38 mL/min — AB (ref >60)
GLUCOSE: 196 mg/dL — AB (ref 65–99)
Potassium: 3.6 mmol/L (ref 3.5–5.1)
Sodium: 126 mmol/L — ABNORMAL LOW (ref 135–145)

## 2017-05-23 LAB — COOXEMETRY PANEL
Carboxyhemoglobin: 1.5 % (ref 0.5–1.5)
Methemoglobin: 1.1 % (ref 0.0–1.5)
O2 Saturation: 67.8 %
Total hemoglobin: 13.3 g/dL (ref 12.0–16.0)

## 2017-05-23 LAB — CBC
HEMATOCRIT: 40 % (ref 36.0–46.0)
HEMOGLOBIN: 13 g/dL (ref 12.0–15.0)
MCH: 28.4 pg (ref 26.0–34.0)
MCHC: 32.5 g/dL (ref 30.0–36.0)
MCV: 87.5 fL (ref 78.0–100.0)
Platelets: 260 10*3/uL (ref 150–400)
RBC: 4.57 MIL/uL (ref 3.87–5.11)
RDW: 16.7 % — ABNORMAL HIGH (ref 11.5–15.5)
WBC: 5.1 10*3/uL (ref 4.0–10.5)

## 2017-05-23 LAB — GLUCOSE, CAPILLARY
GLUCOSE-CAPILLARY: 224 mg/dL — AB (ref 65–99)
GLUCOSE-CAPILLARY: 86 mg/dL (ref 65–99)
Glucose-Capillary: 205 mg/dL — ABNORMAL HIGH (ref 65–99)
Glucose-Capillary: 242 mg/dL — ABNORMAL HIGH (ref 65–99)

## 2017-05-23 MED ORDER — AMIODARONE HCL 200 MG PO TABS
200.0000 mg | ORAL_TABLET | Freq: Two times a day (BID) | ORAL | Status: DC
  Administered 2017-05-23 – 2017-05-28 (×12): 200 mg via ORAL
  Filled 2017-05-23 (×13): qty 1

## 2017-05-23 NOTE — Progress Notes (Signed)
Advanced HF Team Progress Note  Patient Name: Christine Knight Date of Encounter: 05/23/2017  Primary Cardiologist: D. Taite Baldassari, MD   Subjective   On 7/1/  RUE IV infiltrate.   Amio resumed 05/21/17 with RVR, but stopped that evening with AMS and severe fatigue. Similar reaction previously.    Negative only 280 cc and weight up 1 lb. Remains on on lasix gtt 30 mg/hr, metolazone 5 mg BID, diamox 250 mg BID.    Dopamine started at 2.5 mcg/kg/min 05/22/17 with SBPs in 70s.   Feeling OK. Feels like she is urinating the same amount, although I/O falling off. Weight up 1 lb. Weighed standing per patient. Denies SOB, lightheadedness or dizziness.   CVP 16-17 this am.  Coox 67.8% on milrinone 0.25 mcg/kg/min.     Inpatient Medications    Scheduled Meds: . acetaZOLAMIDE  250 mg Oral BID  . apixaban  5 mg Oral BID  . aspirin EC  81 mg Oral Daily  . atorvastatin  40 mg Oral q1800  . insulin aspart  0-20 Units Subcutaneous TID WC  . insulin aspart  0-5 Units Subcutaneous QHS  . insulin aspart  5 Units Subcutaneous TID WC  . insulin glargine  8 Units Subcutaneous Daily  . lactulose  30 g Oral BID  . levothyroxine  50 mcg Oral QAC breakfast  . metolazone  5 mg Oral BID  . potassium chloride  40 mEq Oral TID  . pregabalin  100 mg Oral BID  . rifaximin  550 mg Oral BID  . spironolactone  25 mg Oral Daily   Continuous Infusions: . DOPamine 2.5 mcg/kg/min (05/23/17 0400)  . furosemide (LASIX) infusion 30 mg/hr (05/23/17 0400)  . milrinone 0.25 mcg/kg/min (05/23/17 0400)   PRN Meds: acetaminophen, magnesium hydroxide, ondansetron (ZOFRAN) IV, sodium chloride flush, traMADol   Vital Signs    Vitals:   05/22/17 2055 05/22/17 2317 05/23/17 0405 05/23/17 0537  BP: 94/60 91/67 95/61    Pulse:      Resp: 12 15 16    Temp: 97.9 F (36.6 C) (!) 97 F (36.1 C) (!) 97.4 F (36.3 C)   TempSrc: Axillary Axillary Oral   SpO2: 100% 100% 99%   Weight:    215 lb 9.6 oz (97.8 kg)  Height:         Intake/Output Summary (Last 24 hours) at 05/23/17 0734 Last data filed at 05/23/17 0600  Gross per 24 hour  Intake             2067 ml  Output             2350 ml  Net             -283 ml   Filed Weights   05/21/17 0640 05/22/17 0421 05/23/17 0537  Weight: 216 lb 4.3 oz (98.1 kg) 214 lb 8.1 oz (97.3 kg) 215 lb 9.6 oz (97.8 kg)    Physical Exam   CVP 16-17  General: Fatigued appearing. No resp difficulty. HEENT: Normal Neck: Supple. Thick, JVP to jaw. Carotids 2+ bilat; no bruits. No thyromegaly or nodule noted. Cor: PMI nondisplaced. RRR, No M/G/R noted Lungs: CTAB, normal effort. Abdomen: Soft, non-tender, non-distended, no HSM. No bruits or masses. +BS  Extremities: No cyanosis, clubbing, or rash. BLE 1-2+ edema to thighs. + UNNA boots.  Neuro: Alert & orientedx3, cranial nerves grossly intact. moves all 4 extremities w/o difficulty. Affect pleasant    Labs    Chemistry  Recent Labs Lab 05/21/17 0423 05/22/17  0525 05/23/17 0435  NA 129* 129* 126*  K 3.5 3.0* 3.6  CL 84* 86* 83*  CO2 36* 32 34*  GLUCOSE 182* 175* 196*  BUN 59* 62* 65*  CREATININE 1.43* 1.49* 1.61*  CALCIUM 8.8* 8.6* 9.0  GFRNONAA 38* 36* 33*  GFRAA 43* 41* 38*  ANIONGAP 9 11 9      Hematology  Recent Labs Lab 05/21/17 0423 05/22/17 0525 05/23/17 0435  WBC 4.0 4.5 5.1  RBC 4.25 4.51 4.57  HGB 11.9* 12.7 13.0  HCT 37.0 39.1 40.0  MCV 87.1 86.7 87.5  MCH 28.0 28.2 28.4  MCHC 32.2 32.5 32.5  RDW 17.0* 16.8* 16.7*  PLT 227 263 260    Cardiac Enzymes No results for input(s): TROPONINI in the last 168 hours.  No results for input(s): TROPIPOC in the last 168 hours.   BNP No results for input(s): BNP, PROBNP in the last 168 hours.   Radiology    No results found.   Telemetry    Personally reviewed, Afib 80-90s this am.  Patient Profile     Christine Knight is a 66 y.o. female w/ PMHx significant for morbid obesity, systolic HF due to ICM (10/12 EF 25%; Echo 6/17 EF  15-20%), CAD with h/o NSTEMI s/p CABG Oct 2012, CKD III, severe pulmonary hypertension, PAF, dietary noncompliance, and poorly controlled DM 2, admitted 6/30 with acute on chronic HFrEF and wt gain.  Assessment & Plan    1. Acute on chronic biventricular HF (R>>L):  EF 15-20% 04/14/2016.  Presented 6/30 with marked volume overload and wt gain (up over 30 lbs since 04/16/17) in the setting of eating out frequently and not reporting wts/symptoms.   -- Coox 67.8% on milrinone 0.25 mcg/kg/min.  - Volume status remains somewhat elevated and diuresis sluggish despite lasix gtt at 30 mg/hr + metolazone 5 mg BID + diamox 250 mg BID -- Creatinine climbing. Continue to follow closely.  -- Will stop dopamine with no improvement and risk of worsening Afib.  -- Continue spiro  -- No  blocker/acei/arb/arni due to soft bp's and low output. --Continue unna boots RLE and LLE. Changed 05/19/17  2.  RUE IV infiltration:  7/1 [place] was neg for dvt.   -- R arm continues to improve.  - Continue elevation and warm compresses prn.   3.  CAD/Chest pressure/elevated troponin:  Mild trop elevation @ 0.05.   --No CP. No change. Known to have severe native CAD with patent grafts on cath 12/2015.  -- Cont ASA and statin.   4.  PAF:   -- Remains in afib.  Rate stable.  -- Intolerant to IV amiodarone with severe fatigue. Will attempt po amiodarone at lower dose of 200 mg BID.  -- Continue eliquis 5 mg twice a day.  (CHA2DS2VASc = 5). No bleeding. -- Tentatively plan for TEE/DCCV Monday am.   5.  CKD III:   -- Creatinine trending up slowly. Continue to follow.  -- K improved.   6.  DM II:   - Cont SSI. Appreciate diabetes coordinator input. No change.    7.  OSA:  Severe OSA on sleep study 12/25/16.   -- Needs CPAP. No change.   8.  Morbid obesity: Body mass index is 34.8 kg/m. -- Needs outpatient dietary consultation. No change.   9. PVCs -- Goal K > 4.0 and Mg > 2.0 -- Electrolytes stable. K being supped.   10.  Hyponatremia: -- 126 this am. Continue free water restrict. Continue to follow closely.  11. Hepatic encephalopathy -- Ammonia 41 05/22/17  -- Continue lactulose and xifaxin.   12. DNR  Graciella FreerMichael Andrew Tillery, PA-C  05/23/2017, 7:34 AM    Advanced Heart Failure Team Pager [phone] (M-F; 7a - 4p)  Please contact [facility] for night-coverage after hours (4p -7a ) and weekends on [email or web address] Patient seen and examined with the above-signed Advanced Practice Provider and/or Housestaff. I personally reviewed laboratory data, imaging studies and relevant notes. I independently examined the patient and formulated the important aspects of the plan. I have edited the note to reflect any of my changes or salient points. I have personally discussed the plan with the patient and/or family.  No significant response to dopamine. Remains volume overloaded with sluggish urine output and weight up a pound despite very aggressive diuretic therapy. Suspect AF is limiting diuresis. Failed IV amio twice. Will start po amio and plan for possible TEE/DC-CV on Monday (no slots today). Hyponatremia slightly worse. May need a dose of tolvaptan this weekend.   Arvilla MeresBensimhon, Letti Towell, MD  10:34 AM

## 2017-05-23 NOTE — Progress Notes (Signed)
Physical Therapy Treatment Patient Details Name: Christine Knight MRN: [id number] DOB: November 09, 1951 Today's Date: 05/23/2017    History of Present Illness Patient is a 66 y/o female who presents with heart failure and volume overload. CXR-Small left pleural effusion. Recently admitted 4/24-5/3 for fluid overload. PMH includes morbid obesity, systolic HF due to ICM, CAD with hx of NSTEMI s/p CABG Oct 2012, pulmonary HTN, DM.    PT Comments    Patient reports feeling sleepy this AM due to having taken some sleeping aids last night. Reports some arthritic pain in RLE/ankle but eager to ambulate. HR ranged from 90s-135 bpm during activity with 2/4 DOE but no rest breaks needed. Pt getting tired of being in the hospital. BP soft but improved with mobility. Will continue to follow and progress to improve endurance and higher level balance.    Follow Up Recommendations  No PT follow up;Supervision for mobility/OOB     Equipment Recommendations  None recommended by PT    Recommendations for Other Services       Precautions / Restrictions Precautions Precautions: Fall Restrictions Weight Bearing Restrictions: No    Mobility  Bed Mobility               General bed mobility comments: Sitting EOB upon PT arrival.  Transfers Overall transfer level: Needs assistance Equipment used: None Transfers: Sit to/from Stand Sit to Stand: Modified independent (Device/Increase time)         General transfer comment: Stood from EOB x1, from toilet x1. Managing IV pole but sometimes forgets to grab it and lines are pulling.  Ambulation/Gait Ambulation/Gait assistance: Supervision Ambulation Distance (Feet): 350 Feet Assistive device: None Gait Pattern/deviations: Step-through pattern;Decreased stride length;Antalgic;Decreased stance time - right Gait velocity: decreased   General Gait Details: Slow, antaglic gait with pain through RLE. HR up to 135 bpm, mostly btw 112-120 bpm, 2/4  DOE.   Stairs            Wheelchair Mobility    Modified Rankin (Stroke Patients Only)       Balance Overall balance assessment: Needs assistance Sitting-balance support: Feet supported;No upper extremity supported Sitting balance-[name] Scale: Normal     Standing balance support: No upper extremity supported;During functional activity Standing balance-[name] Scale: Good Standing balance comment: able to walk without device and maintain semi-straight path despite conversation and environmental scanning, manages IV pole mos tof time. Able to stand at sink and wash hands wihtout difficulty.                             Cognition Arousal/Alertness: Awake/alert Behavior During Therapy: WFL for tasks assessed/performed Overall Cognitive Status: Within Functional Limits for tasks assessed                                        Exercises      General Comments General comments (skin integrity, edema, etc.): BP soft 93/65 pre activity, 118/58 post activity.      Pertinent Vitals/Pain Pain Assessment: Faces Faces Pain Scale: Hurts little more Pain Location: RLE Pain Descriptors / Indicators: Sore;Aching Pain Intervention(s): Monitored during session    Home Living                      Prior Function            PT Goals (current goals can  now be found in the care plan section) Progress towards PT goals: Progressing toward goals    Frequency    Min 3X/week      PT Plan Current plan remains appropriate    Co-evaluation              AM-PAC PT "6 Clicks" Daily Activity  Outcome Measure  Difficulty turning over in bed (including adjusting bedclothes, sheets and blankets)?: None Difficulty moving from lying on back to sitting on the side of the bed? : None Difficulty sitting down on and standing up from a chair with arms (e.g., wheelchair, bedside commode, etc,.)?: None Help needed moving to and from a bed to chair  (including a wheelchair)?: None Help needed walking in hospital room?: None Help needed climbing 3-5 steps with a railing? : A Little 6 Click Score: 23    End of Session Equipment Utilized During Treatment: Gait belt Activity Tolerance: Patient limited by fatigue Patient left: in bed;with call bell/phone within reach (sitting EOB) Nurse Communication: Mobility status PT Visit Diagnosis: Unsteadiness on feet (R26.81)     Time: 1610-9604 PT Time Calculation (min) (ACUTE ONLY): 20 min  Charges:  $Therapeutic Exercise: 8-22 mins                    G Codes:       Mylo Red, PT, DPT [phone]     Christine Knight 05/23/2017, 9:58 AM

## 2017-05-24 DIAGNOSIS — N179 Acute kidney failure, unspecified: Secondary | ICD-10-CM

## 2017-05-24 LAB — BASIC METABOLIC PANEL
Anion gap: 10 (ref 5–15)
BUN: 67 mg/dL — AB (ref 6–20)
CALCIUM: 8.3 mg/dL — AB (ref 8.9–10.3)
CHLORIDE: 80 mmol/L — AB (ref 101–111)
CO2: 32 mmol/L (ref 22–32)
CREATININE: 1.7 mg/dL — AB (ref 0.44–1.00)
GFR calc non Af Amer: 30 mL/min — ABNORMAL LOW (ref >60)
GFR, EST AFRICAN AMERICAN: 35 mL/min — AB (ref >60)
GLUCOSE: 349 mg/dL — AB (ref 65–99)
Potassium: 3.8 mmol/L (ref 3.5–5.1)
Sodium: 122 mmol/L — ABNORMAL LOW (ref 135–145)

## 2017-05-24 LAB — CBC
HEMATOCRIT: 36.1 % (ref 36.0–46.0)
HEMOGLOBIN: 11.6 g/dL — AB (ref 12.0–15.0)
MCH: 28.4 pg (ref 26.0–34.0)
MCHC: 32.1 g/dL (ref 30.0–36.0)
MCV: 88.5 fL (ref 78.0–100.0)
Platelets: 229 10*3/uL (ref 150–400)
RBC: 4.08 MIL/uL (ref 3.87–5.11)
RDW: 16.7 % — AB (ref 11.5–15.5)
WBC: 3.9 10*3/uL — ABNORMAL LOW (ref 4.0–10.5)

## 2017-05-24 LAB — COOXEMETRY PANEL
Carboxyhemoglobin: 1.4 % (ref 0.5–1.5)
METHEMOGLOBIN: 1 % (ref 0.0–1.5)
O2 Saturation: 64.8 %
TOTAL HEMOGLOBIN: 12.1 g/dL (ref 12.0–16.0)

## 2017-05-24 LAB — GLUCOSE, CAPILLARY
GLUCOSE-CAPILLARY: 122 mg/dL — AB (ref 65–99)
GLUCOSE-CAPILLARY: 103 mg/dL — AB (ref 65–99)
Glucose-Capillary: 166 mg/dL — ABNORMAL HIGH (ref 65–99)
Glucose-Capillary: 264 mg/dL — ABNORMAL HIGH (ref 65–99)

## 2017-05-24 LAB — MAGNESIUM: Magnesium: 2.3 mg/dL (ref 1.7–2.4)

## 2017-05-24 MED ORDER — TOLVAPTAN 15 MG PO TABS
15.0000 mg | ORAL_TABLET | Freq: Once | ORAL | Status: AC
  Administered 2017-05-24: 15 mg via ORAL
  Filled 2017-05-24: qty 1

## 2017-05-24 NOTE — Progress Notes (Signed)
Advanced HF Team Progress Note  Patient Name: Christine BabeBarbara Knight Date of Encounter: 05/24/2017  Primary Cardiologist: D. Brittony Billick, MD   Subjective   On 7/1/  RUE IV infiltrate.   Amio resumed 05/21/17 with RVR, but stopped that evening with AMS and severe fatigue. Similar reaction previously.    Dopamine started at 2.5 mcg/kg/min 05/22/17 with SBPs in 70s. Didn't help much so stopped on 7/13  Continues with sluggish diuresis despite milrinone and high-dose IV Lasix. Weight up 2 pounds. CVP 19. Creatinine rising slowly. Feels weak and fatigued. Dyspneic with mild activity. Heart rate control improved with amiodarone but remains in atrial fibrillation.   Inpatient Medications    Scheduled Meds: . acetaZOLAMIDE  250 mg Oral BID  . amiodarone  200 mg Oral BID  . apixaban  5 mg Oral BID  . aspirin EC  81 mg Oral Daily  . atorvastatin  40 mg Oral q1800  . insulin aspart  0-20 Units Subcutaneous TID WC  . insulin aspart  0-5 Units Subcutaneous QHS  . insulin aspart  5 Units Subcutaneous TID WC  . insulin glargine  8 Units Subcutaneous Daily  . lactulose  30 g Oral BID  . levothyroxine  50 mcg Oral QAC breakfast  . metolazone  5 mg Oral BID  . potassium chloride  40 mEq Oral TID  . pregabalin  100 mg Oral BID  . rifaximin  550 mg Oral BID  . spironolactone  25 mg Oral Daily   Continuous Infusions: . furosemide (LASIX) infusion 30 mg/hr (05/24/17 0800)  . milrinone 0.25 mcg/kg/min (05/24/17 0700)   PRN Meds: acetaminophen, magnesium hydroxide, ondansetron (ZOFRAN) IV, sodium chloride flush, traMADol   Vital Signs    Vitals:   05/24/17 0353 05/24/17 0748 05/24/17 1136 05/24/17 1200  BP: 117/79 106/66 (!) 82/62 100/70  Pulse: 83 89 88 84  Resp: 19 19 16 13   Temp: 98 F (36.7 C) 98.2 F (36.8 C) 98 F (36.7 C)   TempSrc: Oral Oral Oral   SpO2: 95% 94% 94% 96%  Weight: 98.8 kg (217 lb 12.8 oz)     Height:        Intake/Output Summary (Last 24 hours) at 05/24/17  1434 Last data filed at 05/24/17 0800  Gross per 24 hour  Intake          1532.85 ml  Output              850 ml  Net           682.85 ml   Filed Weights   05/22/17 0421 05/23/17 0537 05/24/17 0353  Weight: 97.3 kg (214 lb 8.1 oz) 97.8 kg (215 lb 9.6 oz) 98.8 kg (217 lb 12.8 oz)    Physical Exam   CVP 19 General:  Sitting in chair. Fatigued.  HEENT: normal Neck: supple. JVP to ear. Carotids 2+ bilat; no bruits. No lymphadenopathy or thryomegaly appreciated. Cor: PMI nondisplaced. IRR. IRR 2/6 TR Lungs: crackles at bases Abdomen: soft, nontender, + distended. No hepatosplenomegaly. No bruits or masses. Good bowel sounds. Extremities: no cyanosis, clubbing, rash,3+ edema Neuro: alert & orientedx3, cranial nerves grossly intact. moves all 4 extremities w/o difficulty. Affect pleasant   Labs    Chemistry  Recent Labs Lab 05/22/17 0525 05/23/17 0435 05/24/17 0420  NA 129* 126* 122*  K 3.0* 3.6 3.8  CL 86* 83* 80*  CO2 32 34* 32  GLUCOSE 175* 196* 349*  BUN 62* 65* 67*  CREATININE 1.49* 1.61* 1.70*  CALCIUM 8.6* 9.0 8.3*  GFRNONAA 36* 33* 30*  GFRAA 41* 38* 35*  ANIONGAP 11 9 10      Hematology  Recent Labs Lab 05/22/17 0525 05/23/17 0435 05/24/17 0420  WBC 4.5 5.1 3.9*  RBC 4.51 4.57 4.08  HGB 12.7 13.0 11.6*  HCT 39.1 40.0 36.1  MCV 86.7 87.5 88.5  MCH 28.2 28.4 28.4  MCHC 32.5 32.5 32.1  RDW 16.8* 16.7* 16.7*  PLT 263 260 229    Cardiac Enzymes No results for input(s): TROPONINI in the last 168 hours.  No results for input(s): TROPIPOC in the last 168 hours.   BNP No results for input(s): BNP, PROBNP in the last 168 hours.   Radiology    No results found.   Telemetry    Personally reviewed, Afib 80-90 + PVCs  Patient Profile     Ms.Salais is a 66 y.o. female w/ PMHx significant for morbid obesity, systolic HF due to ICM (10/12 EF 25%; Echo 6/17 EF 15-20%), CAD with h/o NSTEMI s/p CABG Oct 2012, CKD III, severe pulmonary hypertension,  PAF, dietary noncompliance, and poorly controlled DM 2, admitted 6/30 with acute on chronic HFrEF and wt gain.  Assessment & Plan    1. Acute on chronic biventricular HF (R>>L):  EF 15-20% 04/14/2016.  Presented 6/30 with marked volume overload and wt gain (up over 30 lbs since 04/16/17) in the setting of eating out frequently -- Volume status remains markedly elevated despite milrinone and high-dose Lasix drip/diamox/metoalzone. Suspect atrial fibrillation is playing a major role. -- Coox 64.8% on milrinone 0.25 mcg/kg/min this a.m. -- Creatinine rising slowly -- Did not respond to dopamine. -- Plan TEE and cardioversion on Monday to try and facilitate diuresis. -- Given hyponatremia will give a dose of tolvaptan -- No  blocker/acei/arb/arni due to soft bp's and low output. --Continue unna boots RLE and LLE. Changed 05/19/17  2.  RUE IV infiltration:  7/1 [place] was neg for dvt.   -- Much improved  3.  CAD/Chest pressure/elevated troponin:  Mild trop elevation @ 0.05.   --No CP. No change. Known to have severe native CAD with patent grafts on cath 12/2015.  -- Cont ASA and statin.   4.  PAF:   -- Remains in afib.  Rate stable.  -- Intolerant to IV amiodarone with severe fatigue. Now tolerating by mouth amiodarone 200 twice a day - we will continue -- Continue eliquis 5 mg twice a day.  (CHA2DS2VASc = 5). No bleeding. -- Plan for TEE/DCCV Monday am.   5.  CKD III:   -- Creatinine trending up slowly. Continue to follow.  -- K improved.  -- Hopefully will improve with restoration of normal sinus rhythm  6.  DM II:   - Cont SSI. Appreciate diabetes coordinator input. No change.    7.  OSA:  Severe OSA on sleep study 12/25/16.   -- Needs CPAP. No change.   8.  Morbid obesity: Body mass index is 35.15 kg/m. -- Needs outpatient dietary consultation. No change.   9. PVCs -- Goal K > 4.0 and Mg > 2.0 -- Electrolytes stable. K being supped.   10. Hyponatremia: -- 122  this am. Continue  free water restrict.  -- We'll give a dose of tolvaptan today. Discussed with PharmD.   11. Hepatic encephalopathy -- Ammonia 41 05/22/17  -- Continue lactulose and xifaxin.   12. DNR  Arvilla Meres, MD  05/24/2017, 2:34 PM    Advanced Heart Failure Team Pager [phone] (  M-F; 7a - 4p)  Please contact [facility] for night-coverage after hours (4p -7a ) and weekends on [email or web address]

## 2017-05-25 LAB — COOXEMETRY PANEL
CARBOXYHEMOGLOBIN: 1.8 % — AB (ref 0.5–1.5)
METHEMOGLOBIN: 0.6 % (ref 0.0–1.5)
O2 Saturation: 63 %
Total hemoglobin: 12.5 g/dL (ref 12.0–16.0)

## 2017-05-25 LAB — CBC
HCT: 36.8 % (ref 36.0–46.0)
Hemoglobin: 11.8 g/dL — ABNORMAL LOW (ref 12.0–15.0)
MCH: 27.8 pg (ref 26.0–34.0)
MCHC: 32.1 g/dL (ref 30.0–36.0)
MCV: 86.6 fL (ref 78.0–100.0)
Platelets: 258 10*3/uL (ref 150–400)
RBC: 4.25 MIL/uL (ref 3.87–5.11)
RDW: 16.7 % — ABNORMAL HIGH (ref 11.5–15.5)
WBC: 4.1 10*3/uL (ref 4.0–10.5)

## 2017-05-25 LAB — BASIC METABOLIC PANEL
ANION GAP: 9 (ref 5–15)
BUN: 74 mg/dL — ABNORMAL HIGH (ref 6–20)
CALCIUM: 8.5 mg/dL — AB (ref 8.9–10.3)
CO2: 32 mmol/L (ref 22–32)
Chloride: 83 mmol/L — ABNORMAL LOW (ref 101–111)
Creatinine, Ser: 1.75 mg/dL — ABNORMAL HIGH (ref 0.44–1.00)
GFR calc non Af Amer: 29 mL/min — ABNORMAL LOW (ref >60)
GFR, EST AFRICAN AMERICAN: 34 mL/min — AB (ref >60)
Glucose, Bld: 144 mg/dL — ABNORMAL HIGH (ref 65–99)
POTASSIUM: 3.6 mmol/L (ref 3.5–5.1)
SODIUM: 124 mmol/L — AB (ref 135–145)

## 2017-05-25 LAB — MAGNESIUM: MAGNESIUM: 2.5 mg/dL — AB (ref 1.7–2.4)

## 2017-05-25 LAB — GLUCOSE, CAPILLARY
GLUCOSE-CAPILLARY: 161 mg/dL — AB (ref 65–99)
GLUCOSE-CAPILLARY: 197 mg/dL — AB (ref 65–99)
Glucose-Capillary: 135 mg/dL — ABNORMAL HIGH (ref 65–99)
Glucose-Capillary: 197 mg/dL — ABNORMAL HIGH (ref 65–99)

## 2017-05-25 MED ORDER — SODIUM CHLORIDE 0.9% FLUSH
3.0000 mL | Freq: Two times a day (BID) | INTRAVENOUS | Status: DC
  Administered 2017-05-25: 3 mL via INTRAVENOUS

## 2017-05-25 MED ORDER — LACTULOSE 10 GM/15ML PO SOLN
20.0000 g | Freq: Two times a day (BID) | ORAL | Status: DC
  Administered 2017-05-26 – 2017-05-28 (×5): 20 g via ORAL
  Filled 2017-05-25 (×5): qty 30

## 2017-05-25 MED ORDER — POTASSIUM CHLORIDE CRYS ER 20 MEQ PO TBCR
40.0000 meq | EXTENDED_RELEASE_TABLET | Freq: Once | ORAL | Status: AC
  Administered 2017-05-25: 40 meq via ORAL
  Filled 2017-05-25: qty 2

## 2017-05-25 MED ORDER — SODIUM CHLORIDE 0.9% FLUSH: 3.0000 mL | INTRAVENOUS | Status: DC | PRN

## 2017-05-25 MED ORDER — SODIUM CHLORIDE 0.9 % IV SOLN
250.0000 mL | INTRAVENOUS | Status: DC
  Administered 2017-05-26: 08:00:00 via INTRAVENOUS

## 2017-05-25 NOTE — Anesthesia Preprocedure Evaluation (Addendum)
Anesthesia Evaluation  Patient identified by MRN, date of birth, ID band Patient awake    Reviewed: Allergy & Precautions, NPO status , Patient's Chart, lab work & pertinent test results  Airway Mallampati: II  TM Distance: >3 FB Neck ROM: Full    Dental   Pulmonary shortness of breath, sleep apnea , former smoker,    breath sounds clear to auscultation       Cardiovascular hypertension, + CAD, + Past MI and +CHF   Rhythm:Regular Rate:Normal     Neuro/Psych negative neurological ROS     GI/Hepatic negative GI ROS, Neg liver ROS,   Endo/Other  diabetesHypothyroidism   Renal/GU CRF and ARFRenal disease     Musculoskeletal  (+) Arthritis ,   Abdominal   Peds  Hematology  (+) anemia ,   Anesthesia Other Findings   Reproductive/Obstetrics                            Lab Results  Component Value Date   WBC 4.9 05/26/2017   HGB 13.0 05/26/2017   HCT 39.6 05/26/2017   MCV 86.3 05/26/2017   PLT 276 05/26/2017   Lab Results  Component Value Date   CREATININE 1.71 (H) 05/26/2017   BUN 76 (H) 05/26/2017   NA 129 (L) 05/26/2017   K 3.5 05/26/2017   CL 88 (L) 05/26/2017   CO2 31 05/26/2017    Anesthesia Physical Anesthesia Plan  ASA: IV  Anesthesia Plan: MAC   Post-op Pain Management:    Induction: Intravenous  PONV Risk Score and Plan:   Airway Management Planned: Natural Airway and Nasal Cannula  Additional Equipment:   Intra-op Plan:   Post-operative Plan:   Informed Consent: I have reviewed the patients History and Physical, chart, labs and discussed the procedure including the risks, benefits and alternatives for the proposed anesthesia with the patient or authorized representative who has indicated his/her understanding and acceptance.     Plan Discussed with: CRNA  Anesthesia Plan Comments:        Anesthesia Quick Evaluation

## 2017-05-25 NOTE — Progress Notes (Signed)
Advanced HF Team Progress Note  Patient Name: Christine Knight Date of Encounter: 05/25/2017  Primary Cardiologist: D. Soha Thorup, MD   Subjective   On 7/1/  RUE IV infiltrate.   Amio resumed 05/21/17 with RVR, but stopped that evening with AMS and severe fatigue. Similar reaction previously.    Dopamine started at 2.5 mcg/kg/min 05/22/17 with SBPs in 70s. Didn't help much so stopped on 7/13  Feels weak. Dyspneic with trying to ambulate room.  Multiple BMs with lactulose. Continues with sluggish diuresis despite IV lasix and milrinone. Got 1 dose tolvaptan yesterday for sodium of 122. Co-ox 63% CVP 19   Inpatient Medications    Scheduled Meds: . acetaZOLAMIDE  250 mg Oral BID  . amiodarone  200 mg Oral BID  . apixaban  5 mg Oral BID  . aspirin EC  81 mg Oral Daily  . atorvastatin  40 mg Oral q1800  . insulin aspart  0-20 Units Subcutaneous TID WC  . insulin aspart  0-5 Units Subcutaneous QHS  . insulin aspart  5 Units Subcutaneous TID WC  . insulin glargine  8 Units Subcutaneous Daily  . lactulose  30 g Oral BID  . levothyroxine  50 mcg Oral QAC breakfast  . metolazone  5 mg Oral BID  . potassium chloride  40 mEq Oral TID  . pregabalin  100 mg Oral BID  . rifaximin  550 mg Oral BID  . spironolactone  25 mg Oral Daily   Continuous Infusions: . furosemide (LASIX) infusion 30 mg/hr (05/25/17 0700)  . milrinone 0.25 mcg/kg/min (05/25/17 0700)   PRN Meds: acetaminophen, magnesium hydroxide, ondansetron (ZOFRAN) IV, sodium chloride flush, traMADol   Vital Signs    Vitals:   05/25/17 0354 05/25/17 0430 05/25/17 0741 05/25/17 1124  BP: 99/73  114/75 93/68  Pulse:   92 83  Resp: (!) 27  17 17   Temp: (!) 97.3 F (36.3 C)  97.6 F (36.4 C) 98.2 F (36.8 C)  TempSrc: Oral  Oral Oral  SpO2: 93%  94% 100%  Weight:  98 kg (216 lb)    Height:        Intake/Output Summary (Last 24 hours) at 05/25/17 1444 Last data filed at 05/25/17 1130  Gross per 24 hour  Intake            1034.4 ml  Output             2000 ml  Net           -965.6 ml   Filed Weights   05/23/17 0537 05/24/17 0353 05/25/17 0430  Weight: 97.8 kg (215 lb 9.6 oz) 98.8 kg (217 lb 12.8 oz) 98 kg (216 lb)    Physical Exam   CVP 19 General:  Trying to ambulate room. Weak and unsteady  HEENT: normal Neck: supple. JVP to ear . Carotids 2+ bilat; no bruits. No lymphadenopathy or thryomegaly appreciated. Cor: PMI nondisplaced. IRR IRR 2/6 TR Lungs: + crackles at bases  Abdomen: obese soft, nontender, +distended. No hepatosplenomegaly. No bruits or masses. Good bowel sounds. Extremities: no cyanosis, clubbing, rash, 3+ edema Neuro: alert & oriented x 3, cranial nerves grossly intact. moves all 4 extremities w/o difficulty. Affect pleasant   Labs    Chemistry  Recent Labs Lab 05/23/17 0435 05/24/17 0420 05/25/17 0430  NA 126* 122* 124*  K 3.6 3.8 3.6  CL 83* 80* 83*  CO2 34* 32 32  GLUCOSE 196* 349* 144*  BUN 65* 67* 74*  CREATININE  1.61* 1.70* 1.75*  CALCIUM 9.0 8.3* 8.5*  GFRNONAA 33* 30* 29*  GFRAA 38* 35* 34*  ANIONGAP 9 10 9      Hematology  Recent Labs Lab 05/23/17 0435 05/24/17 0420 05/25/17 0430  WBC 5.1 3.9* 4.1  RBC 4.57 4.08 4.25  HGB 13.0 11.6* 11.8*  HCT 40.0 36.1 36.8  MCV 87.5 88.5 86.6  MCH 28.4 28.4 27.8  MCHC 32.5 32.1 32.1  RDW 16.7* 16.7* 16.7*  PLT 260 229 258    Cardiac Enzymes No results for input(s): TROPONINI in the last 168 hours.  No results for input(s): TROPIPOC in the last 168 hours.   BNP No results for input(s): BNP, PROBNP in the last 168 hours.   Radiology    No results found.   Telemetry    Personally reviewed, Afib 80-90 + PVCs  Patient Profile     Christine Knight is a 66 y.o. female w/ PMHx significant for morbid obesity, systolic HF due to ICM (10/12 EF 25%; Echo 6/17 EF 15-20%), CAD with h/o NSTEMI s/p CABG Oct 2012, CKD III, severe pulmonary hypertension, PAF, dietary noncompliance, and poorly controlled DM 2,  admitted 6/30 with acute on chronic HFrEF and wt gain.  Assessment & Plan    1. Acute on chronic biventricular HF (R>>L):  EF 15-20% 04/14/2016.  Presented 6/30 with marked volume overload and wt gain (up over 30 lbs since 04/16/17) in the setting of eating out frequently -- Volume status remains markedly elevated with CVP 19 despite milrinone and high-dose Lasix drip/diamox/metoalzone. Suspect AF is playing a major role here. Will plan for TEE/DC-CV tomorrow. Make NPO.  -- Coox 63% on milrinone 0.25 mcg/kg/min this a.m. Will continue -- Creatinine continues to rise slowly -- Did not respond to dopamine. -- Hyponatremia improved slightly. Got tolvaptan 7/14. Will not redose today -- No  blocker/acei/arb/arni due to soft bp's and low output. --Continue unna boots RLE and LLE. Changed 05/19/17  2.  RUE IV infiltration:  7/1 [place] was neg for dvt.   -- Much improved  3.  CAD/Chest pressure/elevated troponin:  Mild trop elevation @ 0.05.   -- No further CP.Known to have severe native CAD with patent grafts on cath 12/2015.  -- Cont ASA and statin.   4.  PAF:   -- Remains in afib.  Rate stable.  -- Intolerant to IV amiodarone with severe fatigue. Now tolerating by mouth amiodarone 200 bid- we will continue -- Continue eliquis 5 mg twice a day.  (CHA2DS2VASc = 5). No bleeding. -- Plan for TEE/DCCV in the am. We discussed procedure at length and she agrees   5.  Acute on CKD III:   -- Creatinine trending up slowly. Continue to follow.  -- K 3.6. Will sup .  -- Hopefully will improve with restoration of normal sinus rhythm  6.  DM II:   - Cont SSI. Appreciate diabetes coordinator input. No change.    7.  OSA:  Severe OSA on sleep study 12/25/16.   -- Needs CPAP. No change.   8.  Morbid obesity: Body mass index is 34.86 kg/m. -- Needs outpatient dietary consultation. No change.   9. PVCs -- Goal K > 4.0 and Mg > 2.0 -- Electrolytes stable. K being supped.   10. Hyponatremia: -- 124  this  am. Continue free water restrict.  -- Given a dose of tolvaptan 7/14. Can redose as needed. Discussed with PharmD personally.   11. Hepatic encephalopathy -- Ammonia 41 05/22/17  -- Continue lactulose  and xifaxin.  -- Will cut back lactulose with frequent BMs  12. DNR  Overall remaans very tenuous. Hopefully will improve with restoration of NSR.   Arvilla Meres, MD  05/25/2017, 2:44 PM    Advanced Heart Failure Team Pager [phone] (M-F; 7a - 4p)  Please contact [facility] for night-coverage after hours (4p -7a ) and weekends on [email or web address]

## 2017-05-25 NOTE — Progress Notes (Signed)
At 1523 patient had a 28 beat run of VT, patient converted out of arrhythmia without any interventions. Dr. Gala RomneyBensimhon was called, new orders received and implemented. Will continue to monitor.

## 2017-05-25 NOTE — Progress Notes (Signed)
Pt with periods of increased drowsiness today.  Tonight  Pt was sitting on side of bed tearful, daughter at bedside.  Pt stated " how long am I going to be like this"  Pt did not eat her dinner so ordered her a late tray in which she ate well.  Later pt sleepy when entering her room to give her pm medication. Pt sat up on side of bed took medications well. She refused her lactulose.   I spoke with her about her procedure tomorrow and had the consent for her to sign.  She read it and stated "  I wished I would have talked to them more about this"  Pt wanted to talk more with the dr in the am.  Consent not signed. Will continue to monitor.[name], Minnetta Sandora T

## 2017-05-26 ENCOUNTER — Inpatient Hospital Stay (HOSPITAL_COMMUNITY): Payer: Medicare Other | Admitting: Anesthesiology

## 2017-05-26 ENCOUNTER — Encounter (HOSPITAL_COMMUNITY): Payer: Self-pay | Admitting: *Deleted

## 2017-05-26 ENCOUNTER — Inpatient Hospital Stay (HOSPITAL_COMMUNITY): Payer: Medicare Other

## 2017-05-26 ENCOUNTER — Encounter (HOSPITAL_COMMUNITY)
Admission: EM | Disposition: A | Discharge status: Home / Self Care | Payer: Self-pay | Source: Home / Self Care | Attending: Internal Medicine

## 2017-05-26 DIAGNOSIS — I340 Nonrheumatic mitral (valve) insufficiency: Secondary | ICD-10-CM

## 2017-05-26 HISTORY — PX: TEE WITHOUT CARDIOVERSION: SHX5443

## 2017-05-26 LAB — BASIC METABOLIC PANEL
ANION GAP: 10 (ref 5–15)
BUN: 76 mg/dL — ABNORMAL HIGH (ref 6–20)
CALCIUM: 8.7 mg/dL — AB (ref 8.9–10.3)
CO2: 31 mmol/L (ref 22–32)
Chloride: 88 mmol/L — ABNORMAL LOW (ref 101–111)
Creatinine, Ser: 1.71 mg/dL — ABNORMAL HIGH (ref 0.44–1.00)
GFR, EST AFRICAN AMERICAN: 35 mL/min — AB (ref >60)
GFR, EST NON AFRICAN AMERICAN: 30 mL/min — AB (ref >60)
GLUCOSE: 174 mg/dL — AB (ref 65–99)
POTASSIUM: 3.5 mmol/L (ref 3.5–5.1)
SODIUM: 129 mmol/L — AB (ref 135–145)

## 2017-05-26 LAB — COOXEMETRY PANEL
CARBOXYHEMOGLOBIN: 1.2 % (ref 0.5–1.5)
METHEMOGLOBIN: 1 % (ref 0.0–1.5)
O2 Saturation: 54.6 %
TOTAL HEMOGLOBIN: 13.4 g/dL (ref 12.0–16.0)

## 2017-05-26 LAB — CBC
HCT: 39.6 % (ref 36.0–46.0)
Hemoglobin: 13 g/dL (ref 12.0–15.0)
MCH: 28.3 pg (ref 26.0–34.0)
MCHC: 32.8 g/dL (ref 30.0–36.0)
MCV: 86.3 fL (ref 78.0–100.0)
PLATELETS: 276 10*3/uL (ref 150–400)
RBC: 4.59 MIL/uL (ref 3.87–5.11)
RDW: 16.8 % — AB (ref 11.5–15.5)
WBC: 4.9 10*3/uL (ref 4.0–10.5)

## 2017-05-26 LAB — GLUCOSE, CAPILLARY
GLUCOSE-CAPILLARY: 186 mg/dL — AB (ref 65–99)
GLUCOSE-CAPILLARY: 215 mg/dL — AB (ref 65–99)
Glucose-Capillary: 108 mg/dL — ABNORMAL HIGH (ref 65–99)
Glucose-Capillary: 112 mg/dL — ABNORMAL HIGH (ref 65–99)

## 2017-05-26 LAB — MAGNESIUM: MAGNESIUM: 2.4 mg/dL (ref 1.7–2.4)

## 2017-05-26 SURGERY — ECHOCARDIOGRAM, TRANSESOPHAGEAL
Anesthesia: Monitor Anesthesia Care

## 2017-05-26 MED ORDER — FENTANYL CITRATE (PF) 100 MCG/2ML IJ SOLN
INTRAMUSCULAR | Status: DC | PRN
  Administered 2017-05-26: 25 ug via INTRAVENOUS

## 2017-05-26 MED ORDER — PROPOFOL 500 MG/50ML IV EMUL
INTRAVENOUS | Status: DC | PRN
  Administered 2017-05-26: 40 ug/kg/min via INTRAVENOUS

## 2017-05-26 MED ORDER — PROPOFOL 10 MG/ML IV BOLUS
INTRAVENOUS | Status: DC | PRN
  Administered 2017-05-26 (×3): 10 mg via INTRAVENOUS

## 2017-05-26 MED ORDER — BUTAMBEN-TETRACAINE-BENZOCAINE 2-2-14 % EX AERO
INHALATION_SPRAY | CUTANEOUS | Status: DC | PRN
  Administered 2017-05-26: 2 via TOPICAL

## 2017-05-26 MED ORDER — PHENYLEPHRINE HCL 10 MG/ML IJ SOLN
INTRAMUSCULAR | Status: DC | PRN
  Administered 2017-05-26: 80 ug via INTRAVENOUS
  Administered 2017-05-26: 160 ug via INTRAVENOUS
  Administered 2017-05-26: 240 ug via INTRAVENOUS
  Administered 2017-05-26: 120 ug via INTRAVENOUS
  Administered 2017-05-26: 80 ug via INTRAVENOUS
  Administered 2017-05-26: 160 ug via INTRAVENOUS
  Administered 2017-05-26: 120 ug via INTRAVENOUS

## 2017-05-26 MED ORDER — ORAL CARE MOUTH RINSE
15.0000 mL | Freq: Two times a day (BID) | OROMUCOSAL | Status: DC
  Administered 2017-05-26 – 2017-05-30 (×8): 15 mL via OROMUCOSAL

## 2017-05-26 MED ORDER — MIDAZOLAM HCL 5 MG/5ML IJ SOLN
INTRAMUSCULAR | Status: DC | PRN
  Administered 2017-05-26: 0.5 mg via INTRAVENOUS

## 2017-05-26 NOTE — Progress Notes (Signed)
Pt. Had 29 beat run of Vtach. Asymptomatic and vitals at baseline. MD notified and no new orders.

## 2017-05-26 NOTE — Transfer of Care (Signed)
Immediate Anesthesia Transfer of Care Note  Patient: Tonyetta Berko  Procedure(s) Performed: Procedure(s): TRANSESOPHAGEAL ECHOCARDIOGRAM (TEE) (N/A)  Patient Location: PACU and Endoscopy Unit  Anesthesia Type:MAC  Level of Consciousness: awake, alert , oriented and patient cooperative  Airway & Oxygen Therapy: Patient Spontanous Breathing and Patient connected to nasal cannula oxygen  Post-op Assessment: Report given to RN and Post -op Vital signs reviewed and stable  Post vital signs: Reviewed and stable  Last Vitals:  Vitals:   05/26/17 0651 05/26/17 0713  BP: 95/69 123/76  Pulse: 87 88  Resp: 13 12  Temp: 36.8 C 36.7 C    Last Pain:  Vitals:   05/26/17 0713  TempSrc: Oral  PainSc:       Patients Stated Pain Goal: 2 (51/70/01 7494)  Complications: No apparent anesthesia complications

## 2017-05-26 NOTE — Progress Notes (Signed)
Dr made aware of pt's drowsiness and being tearful. Dr Gala RomneyBensimhon had spoken to pt at length about procedure for 7/16, in which she agreed.  When obtaining a consent pt wished she had talked  to Dr more.  Consent not obtained due to pt's wish .  I had taken care of and  noticed differnce in pt from last weekend to this one.  Last weekend pt laughing, walking halls, more alert.  This week Pt more drowsy, not ambulating much in room, tearful. Questioned if wanted a Ammonia level added to am labs. Md will talk to PA in AM and discuss plan.  Will hold off on level at this time and continue to monitor.  [name], Josefina Rynders T

## 2017-05-26 NOTE — Progress Notes (Addendum)
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  Cardioversion canceled.  Will continue to monitor patient.

## 2017-05-26 NOTE — Progress Notes (Addendum)
Advanced HF Team Progress Note  Patient Name: Christine Knight Date of Encounter: 05/26/2017  Primary Cardiologist: D. Bensimhon, MD   Subjective   On 7/1/  RUE IV infiltrate.   Amio resumed 05/21/17 with RVR, but stopped that evening with AMS and severe fatigue. Similar reaction previously.    Dopamine started at 2.5 mcg/kg/min 05/22/17 with SBPs in 70s. Didn't help much so stopped on 7/13  Remains on milrinone 0.25 mcg/kg/min + IV lasix 30 mg hr + metolazone 5 mg BID + Diamox 250 mg BID.   Feels tired. Dyspneic with mild exertion. Did have better diuresis yesterday. Slightly anxious for TEE/DCCV this am.   Coox 54.6% and CVP 16-17.   Inpatient Medications    Scheduled Meds: . [MAR Hold] acetaZOLAMIDE  250 mg Oral BID  . [MAR Hold] amiodarone  200 mg Oral BID  . [MAR Hold] apixaban  5 mg Oral BID  . [MAR Hold] aspirin EC  81 mg Oral Daily  . [MAR Hold] atorvastatin  40 mg Oral q1800  . [MAR Hold] insulin aspart  0-20 Units Subcutaneous TID WC  . [MAR Hold] insulin aspart  0-5 Units Subcutaneous QHS  . [MAR Hold] insulin aspart  5 Units Subcutaneous TID WC  . [MAR Hold] insulin glargine  8 Units Subcutaneous Daily  . [MAR Hold] lactulose  20 g Oral BID  . [MAR Hold] levothyroxine  50 mcg Oral QAC breakfast  . [MAR Hold] metolazone  5 mg Oral BID  . [MAR Hold] potassium chloride  40 mEq Oral TID  . [MAR Hold] pregabalin  100 mg Oral BID  . [MAR Hold] rifaximin  550 mg Oral BID  . [MAR Hold] sodium chloride flush  3 mL Intravenous Q12H  . [MAR Hold] spironolactone  25 mg Oral Daily   Continuous Infusions: . sodium chloride    . furosemide (LASIX) infusion 30 mg/hr (05/26/17 0303)  . milrinone 0.25 mcg/kg/min (05/26/17 0303)   PRN Meds: [MAR Hold] acetaminophen, [MAR Hold] magnesium hydroxide, [MAR Hold] ondansetron (ZOFRAN) IV, [MAR Hold] sodium chloride flush, [MAR Hold] sodium chloride flush, [MAR Hold] traMADol   Vital Signs    Vitals:   05/26/17 0400 05/26/17  0418 05/26/17 0651 05/26/17 0713  BP: (!) 91/57  95/69 123/76  Pulse: 86  87 88  Resp: 13  13 12   Temp: 98 F (36.7 C)  98.2 F (36.8 C) 98 F (36.7 C)  TempSrc: Oral  Axillary Oral  SpO2: 93%  93% 94%  Weight:  211 lb 12.8 oz (96.1 kg)    Height:        Intake/Output Summary (Last 24 hours) at 05/26/17 0800 Last data filed at 05/26/17 0515  Gross per 24 hour  Intake          1160.12 ml  Output             3450 ml  Net         -2289.88 ml   Filed Weights   05/24/17 0353 05/25/17 0430 05/26/17 0418  Weight: 217 lb 12.8 oz (98.8 kg) 216 lb (98 kg) 211 lb 12.8 oz (96.1 kg)    Physical Exam   CVP 16-17  General: Well appearing. No resp difficulty. HEENT: Normal Neck: Supple. JVP to ear. Carotids 2+ bilat; no bruits. No thyromegaly or nodule noted. Cor: PMI nondisplaced. IRR, IRR, 2/6 TR.  Lungs: Bibasilar crackles.  Abdomen: Soft, non-tender, +distended, no HSM. No bruits or masses. +BS  Extremities: No cyanosis, clubbing,or  Rash.  2-3+ BLE edema.   Neuro: Alert & orientedx3, cranial nerves grossly intact. moves all 4 extremities w/o difficulty. Affect pleasant   Labs    Chemistry  Recent Labs Lab 05/24/17 0420 05/25/17 0430 05/26/17 0440  NA 122* 124* 129*  K 3.8 3.6 3.5  CL 80* 83* 88*  CO2 32 32 31  GLUCOSE 349* 144* 174*  BUN 67* 74* 76*  CREATININE 1.70* 1.75* 1.71*  CALCIUM 8.3* 8.5* 8.7*  GFRNONAA 30* 29* 30*  GFRAA 35* 34* 35*  ANIONGAP 10 9 10      Hematology  Recent Labs Lab 05/24/17 0420 05/25/17 0430 05/26/17 0440  WBC 3.9* 4.1 4.9  RBC 4.08 4.25 4.59  HGB 11.6* 11.8* 13.0  HCT 36.1 36.8 39.6  MCV 88.5 86.6 86.3  MCH 28.4 27.8 28.3  MCHC 32.1 32.1 32.8  RDW 16.7* 16.7* 16.8*  PLT 229 258 276    Cardiac Enzymes No results for input(s): TROPONINI in the last 168 hours.  No results for input(s): TROPIPOC in the last 168 hours.   BNP No results for input(s): BNP, PROBNP in the last 168 hours.   Radiology    No results found.    Telemetry    Personally reviewed, Afib 80-90 + PVCs  Patient Profile     Ms.Dougher is a 66 y.o. female w/ PMHx significant for morbid obesity, systolic HF due to ICM (10/12 EF 25%; Echo 6/17 EF 15-20%), CAD with h/o NSTEMI s/p CABG Oct 2012, CKD III, severe pulmonary hypertension, PAF, dietary noncompliance, and poorly controlled DM 2, admitted 6/30 with acute on chronic HFrEF and wt gain.  Assessment & Plan    1. Acute on chronic biventricular HF (R>>L):  EF 15-20% 04/14/2016.  Presented 6/30 with marked volume overload and wt gain (up over 30 lbs since 04/16/17) in the setting of eating out frequently -- Volume status remains markedly elevated with CVP 19 despite milrinone and high-dose Lasix drip/diamox/metoalzone. Suspect AF is playing a major role here. Will plan for TEE/DC-CV tomorrow. Make NPO.  -- Coox 63% on milrinone 0.25 mcg/kg/min this a.m. Will continue -- Creatinine continues to rise slowly -- Did not respond to dopamine. -- Hyponatremia improved slightly. Got tolvaptan 7/14. Will not redose today -- No  blocker/acei/arb/arni due to soft bp's and low output. --Continue unna boots RLE and LLE. Changed 05/19/17  2.  RUE IV infiltration:  7/1 US was neg for dvt.   -- Much improved.   3.  CAD/Chest pressure/elevated troponin:  Mild trop elevation @ 0.05.   -- No further CP.  Known to have severe native CAD with patent grafts on cath 12/2015.  -- Cont ASA and statin.    4.  PAF:   -- Remains in afib.  Rate stable.  -- Intolerant to IV amiodarone with severe fatigue. Now tolerating by mouth amiodarone 200 bid- we will continue -- Continue eliquis 5 mg twice a day.  (CHA2DS2VASc = 5). No bleeding. -- Plan for TEE/DCCV this am. Discussed procedure at length and pt consents.   5.  Acute on CKD III:   -- Creatinine slowly trending up.   -- K 3.5. Will supp.    6.  DM II:   - Cont SSI. Appreciate diabetes coordinator input. No change.    7.  OSA:  Severe OSA on sleep study  12/25/16.   -- Needs CPAP. No change.    8.  Morbid obesity: Body mass index is 34.19 kg/m. -- Needs outpatient dietary consultation. No change.  9. PVCs -- Goal K > 4.0 and Mg > 2.0 -- Electrolytes stable. K being supped.    10. Hyponatremia: -- 129 this am. Continue free water restrict.   -- Given a dose of tolvaptan 7/14. Can redose as needed. Discussed with PharmD personally.   11. Hepatic encephalopathy -- Ammonia 41 05/22/17  -- Continue lactulose and xifaxin.    12. DNR  Overall remains tenuous. Hope she will improve with restoration of NSR.   [name], PA-C  05/26/2017, 8:00 AM    Advanced Heart Failure Team Pager [phone] (M-F; 7a - 4p)  Please contact [facility] for night-coverage after hours (4p -7a ) and weekends on [email or web address]  Patient seen with PA, agree with the above note.    She remains markedly volume overloaded with biventricular failure.  She diuresed reasonably yesterday with current diuretic regimen, would continue today.  Continue current milrinone while diuresing.  Renal function stable.   She is in atrial fibrillation, rate is controlled in 90s.  We did TEE today but there appeared to be amorphous clot in the LA appendage.  Therefore, no DCCV.  Will plan to continue Eliquis, can re-attempt TEE-DCCV in 3-4 weeks.   Continue amiodarone for now.  Marca Ancona 05/26/2017 8:46 AM

## 2017-05-26 NOTE — Progress Notes (Signed)
Pt had run of PVC with HR up to 130s,  BP 70/39, Dr. Sampson GoonFitzgerald notified.  Dr. In room to assess.

## 2017-05-26 NOTE — Progress Notes (Signed)
Physical Therapy Treatment Patient Details Name: Christine Knight MRN: [id number] DOB: [birth year] Today's Date: 05/26/2017    History of Present Illness Patient is a 66 y/o female who presents with heart failure and volume overload. CXR-Small left pleural effusion. Recently admitted 4/24-5/3 for fluid overload. PMH includes morbid obesity, systolic HF due to ICM, CAD with hx of NSTEMI s/p CABG Oct 2012, pulmonary HTN, DM.    PT Comments    Pt cardiopulmonary response and fatigue are limiting her progress today. Pt is supervision for bed mobility and transfers and min guard for 2 feet of ambulation to sit in recliner. Pt requires skilled PT to progress her mobility as well as LE strength and endurance to safely mobilize in her home environment at discharge.    Follow Up Recommendations  No PT follow up;Supervision for mobility/OOB     Equipment Recommendations  None recommended by PT       Precautions / Restrictions Precautions Precautions: Fall Restrictions Weight Bearing Restrictions: No    Mobility  Bed Mobility Overal bed mobility: Needs Assistance Bed Mobility: Sit to Supine       Sit to supine: Supervision   General bed mobility comments: supervision for safety  Transfers Overall transfer level: Needs assistance Equipment used: None Transfers: Sit to/from Stand Sit to Stand: Supervision         General transfer comment: supervision for safety, increased time for powerup  Ambulation/Gait Ambulation/Gait assistance: Min guard Ambulation Distance (Feet): 2 Feet Assistive device: Rolling walker (2 wheeled) Gait Pattern/deviations: Decreased stride length;Decreased stance time - right;Shuffle;Step-to pattern Gait velocity: decreased Gait velocity interpretation: Below normal speed for age/gender General Gait Details: slow, staggering steps to recliner          Balance Overall balance assessment: Needs assistance Sitting-balance support: Feet  supported;No upper extremity supported Sitting balance-[name] Scale: Good     Standing balance support: No upper extremity supported Standing balance-[name] Scale: Good                              Cognition Arousal/Alertness: Awake/alert Behavior During Therapy: WFL for tasks assessed/performed Overall Cognitive Status: Within Functional Limits for tasks assessed                                        Exercises General Exercises - Lower Extremity Long Arc Quad: AROM;Both;10 reps Hip Flexion/Marching: AROM;Both;10 reps;Seated    General Comments General comments (skin integrity, edema, etc.): supine in bed BP 96/61, HR 88 bpm, SaO2 on RA 96%O2, RR 15, PVCs 12, seated EoB, BP 116/89, HR 90 bpm, SaO2 on RA 95%O2, RR 22, PVC 22, with seated exercise PVCs rose to 26, pt stand step transferred to recliner with 24 PVC and HR increased to 135 bpm, after sitting in recliner BP 100/75, HR 98bpm, SaO2 on RA 95%, RR 16, and PVC 20       Pertinent Vitals/Pain Pain Assessment: Faces Faces Pain Scale: Hurts little more Pain Location: RLE Pain Descriptors / Indicators: Sore;Aching Pain Intervention(s): Monitored during session;Limited activity within patient's tolerance           PT Goals (current goals can now be found in the care plan section) Acute Rehab PT Goals PT Goal Formulation: With patient Time For Goal Achievement: 06/09/17 Potential to Achieve Goals: Fair Progress towards PT goals: Progressing toward goals    Frequency  Min 3X/week      PT Plan Current plan remains appropriate       AM-PAC PT "6 Clicks" Daily Activity  Outcome Measure  Difficulty turning over in bed (including adjusting bedclothes, sheets and blankets)?: None Difficulty moving from lying on back to sitting on the side of the bed? : A Little Difficulty sitting down on and standing up from a chair with arms (e.g., wheelchair, bedside commode, etc,.)?: A Little Help  needed moving to and from a bed to chair (including a wheelchair)?: A Little Help needed walking in hospital room?: A Little Help needed climbing 3-5 steps with a railing? : A Lot 6 Click Score: 18    End of Session Equipment Utilized During Treatment: Gait belt Activity Tolerance: Patient tolerated treatment well;Patient limited by fatigue Patient left: in bed;with call bell/phone within reach;with chair alarm set Nurse Communication: Mobility status;Other (comment) PT Visit Diagnosis: Unsteadiness on feet (R26.81)     Time: 6045-40981118-1138 PT Time Calculation (min) (ACUTE ONLY): 20 min  Charges:  $Therapeutic Activity: 8-22 mins                    G Codes:       Jerilyn Gillaspie B. Beverely RisenVan Fleet PT, DPT Acute Rehabilitation  [phone]) [phone] Pager [phone](336) [phone]     Elon Alaslizabeth B Van Fleet 05/26/2017, 1:35 PM

## 2017-05-26 NOTE — Progress Notes (Signed)
  Paged for 29 beats of NSVT/VT.    Mg stable this am. K 3.6 and supp ordered.   Asymptomatic. Continue to follow for now.    Casimiro NeedleMichael [address]"Andy" [place], PA-C 05/26/2017 4:35 PM

## 2017-05-26 NOTE — H&P (View-Only) (Signed)
Advanced HF Team Progress Note  Patient Name: Christine Knight Date of Encounter: 05/25/2017  Primary Cardiologist: D. Salsabeel Gorelick, MD   Subjective   On 7/1/  RUE IV infiltrate.   Amio resumed 05/21/17 with RVR, but stopped that evening with AMS and severe fatigue. Similar reaction previously.    Dopamine started at 2.5 mcg/kg/min 05/22/17 with SBPs in 70s. Didn't help much so stopped on 7/13  Feels weak. Dyspneic with trying to ambulate room.  Multiple BMs with lactulose. Continues with sluggish diuresis despite IV lasix and milrinone. Got 1 dose tolvaptan yesterday for sodium of 122. Co-ox 63% CVP 19   Inpatient Medications    Scheduled Meds: . acetaZOLAMIDE  250 mg Oral BID  . amiodarone  200 mg Oral BID  . apixaban  5 mg Oral BID  . aspirin EC  81 mg Oral Daily  . atorvastatin  40 mg Oral q1800  . insulin aspart  0-20 Units Subcutaneous TID WC  . insulin aspart  0-5 Units Subcutaneous QHS  . insulin aspart  5 Units Subcutaneous TID WC  . insulin glargine  8 Units Subcutaneous Daily  . lactulose  30 g Oral BID  . levothyroxine  50 mcg Oral QAC breakfast  . metolazone  5 mg Oral BID  . potassium chloride  40 mEq Oral TID  . pregabalin  100 mg Oral BID  . rifaximin  550 mg Oral BID  . spironolactone  25 mg Oral Daily   Continuous Infusions: . furosemide (LASIX) infusion 30 mg/hr (05/25/17 0700)  . milrinone 0.25 mcg/kg/min (05/25/17 0700)   PRN Meds: acetaminophen, magnesium hydroxide, ondansetron (ZOFRAN) IV, sodium chloride flush, traMADol   Vital Signs    Vitals:   05/25/17 0354 05/25/17 0430 05/25/17 0741 05/25/17 1124  BP: 99/73  114/75 93/68  Pulse:   92 83  Resp: (!) 27  17 17   Temp: (!) 97.3 F (36.3 C)  97.6 F (36.4 C) 98.2 F (36.8 C)  TempSrc: Oral  Oral Oral  SpO2: 93%  94% 100%  Weight:  98 kg (216 lb)    Height:        Intake/Output Summary (Last 24 hours) at 05/25/17 1444 Last data filed at 05/25/17 1130  Gross per 24 hour  Intake            1034.4 ml  Output             2000 ml  Net           -965.6 ml   Filed Weights   05/23/17 0537 05/24/17 0353 05/25/17 0430  Weight: 97.8 kg (215 lb 9.6 oz) 98.8 kg (217 lb 12.8 oz) 98 kg (216 lb)    Physical Exam   CVP 19 General:  Trying to ambulate room. Weak and unsteady  HEENT: normal Neck: supple. JVP to ear . Carotids 2+ bilat; no bruits. No lymphadenopathy or thryomegaly appreciated. Cor: PMI nondisplaced. IRR IRR 2/6 TR Lungs: + crackles at bases  Abdomen: obese soft, nontender, +distended. No hepatosplenomegaly. No bruits or masses. Good bowel sounds. Extremities: no cyanosis, clubbing, rash, 3+ edema Neuro: alert & oriented x 3, cranial nerves grossly intact. moves all 4 extremities w/o difficulty. Affect pleasant   Labs    Chemistry  Recent Labs Lab 05/23/17 0435 05/24/17 0420 05/25/17 0430  NA 126* 122* 124*  K 3.6 3.8 3.6  CL 83* 80* 83*  CO2 34* 32 32  GLUCOSE 196* 349* 144*  BUN 65* 67* 74*  CREATININE  1.61* 1.70* 1.75*  CALCIUM 9.0 8.3* 8.5*  GFRNONAA 33* 30* 29*  GFRAA 38* 35* 34*  ANIONGAP 9 10 9      Hematology  Recent Labs Lab 05/23/17 0435 05/24/17 0420 05/25/17 0430  WBC 5.1 3.9* 4.1  RBC 4.57 4.08 4.25  HGB 13.0 11.6* 11.8*  HCT 40.0 36.1 36.8  MCV 87.5 88.5 86.6  MCH 28.4 28.4 27.8  MCHC 32.5 32.1 32.1  RDW 16.7* 16.7* 16.7*  PLT 260 229 258    Cardiac Enzymes No results for input(s): TROPONINI in the last 168 hours.  No results for input(s): TROPIPOC in the last 168 hours.   BNP No results for input(s): BNP, PROBNP in the last 168 hours.   Radiology    No results found.   Telemetry    Personally reviewed, Afib 80-90 + PVCs  Patient Profile     Christine Knight is a 66 y.o. female w/ PMHx significant for morbid obesity, systolic HF due to ICM (10/12 EF 25%; Echo 6/17 EF 15-20%), CAD with h/o NSTEMI s/p CABG Oct 2012, CKD III, severe pulmonary hypertension, PAF, dietary noncompliance, and poorly controlled DM 2,  admitted 6/30 with acute on chronic HFrEF and wt gain.  Assessment & Plan    1. Acute on chronic biventricular HF (R>>L):  EF 15-20% 04/14/2016.  Presented 6/30 with marked volume overload and wt gain (up over 30 lbs since 04/16/17) in the setting of eating out frequently -- Volume status remains markedly elevated with CVP 19 despite milrinone and high-dose Lasix drip/diamox/metoalzone. Suspect AF is playing a major role here. Will plan for TEE/DC-CV tomorrow. Make NPO.  -- Coox 63% on milrinone 0.25 mcg/kg/min this a.m. Will continue -- Creatinine continues to rise slowly -- Did not respond to dopamine. -- Hyponatremia improved slightly. Got tolvaptan 7/14. Will not redose today -- No  blocker/acei/arb/arni due to soft bp's and low output. --Continue unna boots RLE and LLE. Changed 05/19/17  2.  RUE IV infiltration:  7/1 US was neg for dvt.   -- Much improved  3.  CAD/Chest pressure/elevated troponin:  Mild trop elevation @ 0.05.   -- No further CP.Known to have severe native CAD with patent grafts on cath 12/2015.  -- Cont ASA and statin.   4.  PAF:   -- Remains in afib.  Rate stable.  -- Intolerant to IV amiodarone with severe fatigue. Now tolerating by mouth amiodarone 200 bid- we will continue -- Continue eliquis 5 mg twice a day.  (CHA2DS2VASc = 5). No bleeding. -- Plan for TEE/DCCV in the am. We discussed procedure at length and she agrees   5.  Acute on CKD III:   -- Creatinine trending up slowly. Continue to follow.  -- K 3.6. Will sup .  -- Hopefully will improve with restoration of normal sinus rhythm  6.  DM II:   - Cont SSI. Appreciate diabetes coordinator input. No change.    7.  OSA:  Severe OSA on sleep study 12/25/16.   -- Needs CPAP. No change.   8.  Morbid obesity: Body mass index is 34.86 kg/m. -- Needs outpatient dietary consultation. No change.   9. PVCs -- Goal K > 4.0 and Mg > 2.0 -- Electrolytes stable. K being supped.   10. Hyponatremia: -- 124  this  am. Continue free water restrict.  -- Given a dose of tolvaptan 7/14. Can redose as needed. Discussed with PharmD personally.   11. Hepatic encephalopathy -- Ammonia 41 05/22/17  -- Continue lactulose  and xifaxin.  -- Will cut back lactulose with frequent BMs  12. DNR  Overall remaans very tenuous. Hopefully will improve with restoration of NSR.   Arvilla Meres, MD  05/25/2017, 2:44 PM    Advanced Heart Failure Team Pager [phone] (M-F; 7a - 4p)  Please contact [facility] for night-coverage after hours (4p -7a ) and weekends on [email or web address]

## 2017-05-26 NOTE — Progress Notes (Deleted)
Physical Therapy Treatment Patient Details Name: Christine Knight MRN: [id number] DOB: 08/21/1951 Today's Date: 05/26/2017    History of Present Illness Patient is a 66 y/o female who presents with heart failure and volume overload. CXR-Small left pleural effusion. Recently admitted 4/24-5/3 for fluid overload. PMH includes morbid obesity, systolic HF due to ICM, CAD with hx of NSTEMI s/p CABG Oct 2012, pulmonary HTN, DM.    PT Comments    Pt is making progress towards her goals but is anxious about her legs being able to hold her up with gait. Pt did not have any knee buckling or LoB today. Pt currently modA for bed mobility, minAx2 for transfers and minA for stability with ambulation of 15 feet with RW. Pt requires skilled PT to progress gait training and to increase LE strength and endurance to safely mobilize in her discharge environment.      Follow Up Recommendations  No PT follow up;Supervision for mobility/OOB     Equipment Recommendations  None recommended by PT       Precautions / Restrictions Precautions Precautions: Fall Restrictions Weight Bearing Restrictions: No    Mobility  Bed Mobility Overal bed mobility: Needs Assistance Bed Mobility: Sit to Supine       Sit to supine: Mod assist   General bed mobility comments: ModA for managing LE back up into bed  Transfers Overall transfer level: Needs assistance Equipment used: None;Rolling walker (2 wheeled) Transfers: Sit to/from Stand Sit to Stand: Min assist;+2 physical assistance         General transfer comment: minAx2 for power up, requiring increased time to come to upright, vc for scooting further forward in chair before beginning powerup  Ambulation/Gait Ambulation/Gait assistance: Min assist Ambulation Distance (Feet): 15 Feet Assistive device: Rolling walker (2 wheeled) Gait Pattern/deviations: Step-through pattern;Decreased stride length;Antalgic;Decreased stance time - right Gait velocity:  decreased Gait velocity interpretation: Below normal speed for age/gender General Gait Details: Slow, antaglic gait with slight instability in R knee with weightbearing          Balance Overall balance assessment: Needs assistance Sitting-balance support: Feet supported;No upper extremity supported Sitting balance-[name] Scale: Good     Standing balance support: No upper extremity supported Standing balance-[name] Scale: Fair Standing balance comment: required RW for steading today                            Cognition Arousal/Alertness: Awake/alert Behavior During Therapy: WFL for tasks assessed/performed Overall Cognitive Status: Within Functional Limits for tasks assessed                                           General Comments General comments (skin integrity, edema, etc.): SaO2 dropped to 83-84%O2 with poor waveform during ambulation with no DOE, after activity BP 107/82, HR 101 bpm, SaO2 on RA 94%O2, RR 25       Pertinent Vitals/Pain Pain Assessment: Faces Faces Pain Scale: Hurts little more Pain Location: R knee Pain Descriptors / Indicators: Sore;Aching Pain Intervention(s): Monitored during session;Limited activity within patient's tolerance           PT Goals (current goals can now be found in the care plan section) Acute Rehab PT Goals PT Goal Formulation: With patient Time For Goal Achievement: 06/09/17 Potential to Achieve Goals: Fair Progress towards PT goals: Progressing toward goals    Frequency  Min 3X/week      PT Plan Current plan remains appropriate       AM-PAC PT "6 Clicks" Daily Activity  Outcome Measure  Difficulty turning over in bed (including adjusting bedclothes, sheets and blankets)?: None Difficulty moving from lying on back to sitting on the side of the bed? : A Lot Difficulty sitting down on and standing up from a chair with arms (e.g., wheelchair, bedside commode, etc,.)?: A Lot Help needed  moving to and from a bed to chair (including a wheelchair)?: A Little Help needed walking in hospital room?: A Little Help needed climbing 3-5 steps with a railing? : A Lot 6 Click Score: 16    End of Session Equipment Utilized During Treatment: Gait belt Activity Tolerance: Patient tolerated treatment well Patient left: in bed;with call bell/phone within reach;with nursing/sitter in room;with family/visitor present Nurse Communication: Mobility status PT Visit Diagnosis: Unsteadiness on feet (R26.81)     Time: 8119-14781138-1151 PT Time Calculation (min) (ACUTE ONLY): 13 min  Charges:  $Gait Training: 8-22 mins                    G Codes:       Christine Knight PT, DPT Acute Rehabilitation  [phone]) [phone] Pager [phone](336) [phone]     Christine Knight 05/26/2017, 12:16 PM

## 2017-05-26 NOTE — Anesthesia Postprocedure Evaluation (Signed)
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    Last Vitals:  Vitals:   05/26/17 0915 05/26/17 0920  BP: (!) 94/52 (!) 90/57  Pulse: 78 89  Resp: 13 15  Temp:      Last Pain:  Vitals:   05/26/17 0853  TempSrc: Oral  PainSc:                  Tiajuana Amass

## 2017-05-26 NOTE — Interval H&P Note (Signed)
History and Physical Interval Note:  05/26/2017 8:38 AM  [name]  has presented today for surgery, with the diagnosis of a fib  The various methods of treatment have been discussed with the patient and family. After consideration of risks, benefits and other options for treatment, the patient has consented to  Procedure(s): TRANSESOPHAGEAL ECHOCARDIOGRAM (TEE) (N/A) as a surgical intervention .  The patient's history has been reviewed, patient examined, no change in status, stable for surgery.  I have reviewed the patient's chart and labs.  Questions were answered to the patient's satisfaction.     Donaldson Richter [employer]

## 2017-05-26 NOTE — CV Procedure (Signed)
Procedure: TEE  Indication: Atrial fibrillation  Sedation: Per anesthesiology  Findings: Please see echo section for full report.  Mildly dilated LV with EF 25-30%, diffuse hypokinesis.  Moderately dilated RV with moderately decreased systolic function. Moderate right atrial enlargement.  Mild left atrial enlargement.  There is thick smoke/amorphous clot in the left atrial appendage.  No PFO/ASD by color doppler.  Moderate tricuspid regurgitation, peak RV-RA gradient 54 mmHg.  Mild to moderate mitral regurgitation.  Trileaflet aortic valve with mild calcification, no AS or AI.  Mild pulmonic insufficiency.  Normal caliber aorta with mild plaque in the descending thoracic aorta.    Impression: Amorphous clot in the left atrial appendage.   Recommend continue Eliquis x 1 month, reassess with TEE for DCCV at that point.    Christine Knight 05/26/2017 8:42 AM

## 2017-05-27 ENCOUNTER — Encounter (HOSPITAL_COMMUNITY): Payer: Self-pay | Admitting: Cardiology

## 2017-05-27 LAB — MAGNESIUM: Magnesium: 2.5 mg/dL — ABNORMAL HIGH (ref 1.7–2.4)

## 2017-05-27 LAB — GLUCOSE, CAPILLARY
GLUCOSE-CAPILLARY: 108 mg/dL — AB (ref 65–99)
Glucose-Capillary: 203 mg/dL — ABNORMAL HIGH (ref 65–99)
Glucose-Capillary: 103 mg/dL — ABNORMAL HIGH (ref 65–99)
Glucose-Capillary: 236 mg/dL — ABNORMAL HIGH (ref 65–99)

## 2017-05-27 LAB — COOXEMETRY PANEL
CARBOXYHEMOGLOBIN: 1.5 % (ref 0.5–1.5)
Methemoglobin: 0.9 % (ref 0.0–1.5)
O2 SAT: 65.8 %
Total hemoglobin: 12.8 g/dL (ref 12.0–16.0)

## 2017-05-27 LAB — CBC
HEMATOCRIT: 38.2 % (ref 36.0–46.0)
Hemoglobin: 12.4 g/dL (ref 12.0–15.0)
MCH: 28.2 pg (ref 26.0–34.0)
MCHC: 32.5 g/dL (ref 30.0–36.0)
MCV: 86.8 fL (ref 78.0–100.0)
Platelets: 246 10*3/uL (ref 150–400)
RBC: 4.4 MIL/uL (ref 3.87–5.11)
RDW: 16.7 % — AB (ref 11.5–15.5)
WBC: 4.4 10*3/uL (ref 4.0–10.5)

## 2017-05-27 LAB — BASIC METABOLIC PANEL
ANION GAP: 12 (ref 5–15)
BUN: 78 mg/dL — ABNORMAL HIGH (ref 6–20)
CALCIUM: 9.2 mg/dL (ref 8.9–10.3)
CO2: 33 mmol/L — AB (ref 22–32)
Chloride: 86 mmol/L — ABNORMAL LOW (ref 101–111)
Creatinine, Ser: 1.6 mg/dL — ABNORMAL HIGH (ref 0.44–1.00)
GFR, EST AFRICAN AMERICAN: 38 mL/min — AB (ref >60)
GFR, EST NON AFRICAN AMERICAN: 33 mL/min — AB (ref >60)
Glucose, Bld: 105 mg/dL — ABNORMAL HIGH (ref 65–99)
Potassium: 3.2 mmol/L — ABNORMAL LOW (ref 3.5–5.1)
Sodium: 131 mmol/L — ABNORMAL LOW (ref 135–145)

## 2017-05-27 MED ORDER — POTASSIUM CHLORIDE CRYS ER 20 MEQ PO TBCR
20.0000 meq | EXTENDED_RELEASE_TABLET | Freq: Once | ORAL | Status: AC
  Administered 2017-05-27: 20 meq via ORAL
  Filled 2017-05-27: qty 1

## 2017-05-27 MED ORDER — TORSEMIDE 100 MG PO TABS: 100.0000 mg | ORAL_TABLET | Freq: Two times a day (BID) | ORAL | Status: DC

## 2017-05-27 MED ORDER — TORSEMIDE 20 MG PO TABS: 80.0000 mg | ORAL_TABLET | Freq: Two times a day (BID) | ORAL | Status: DC

## 2017-05-27 NOTE — Progress Notes (Signed)
Patients bp is soft 87/64 with a map of 73. Christine GlennMichael Tillery at bedside. Told to still give meds given bp is low. Meds given. Will continue to monitor.

## 2017-05-27 NOTE — Progress Notes (Signed)
Pt potassium of 3.2. pvc's noted.  Will give 10 am dose early.  Will continue to monitor. [name], Grete Bosko T

## 2017-05-27 NOTE — Care Management Note (Signed)
Case Management Note  Patient Details  Name: Christine Knight MRN: [id number] Date of Birth: [birth year]  Subjective/Objective:    Pt admitted with HF                Action/Plan:   PTA from home.  [facility] - [facility] and PT.  CM submitted benefit check for eliquis   Expected Discharge Date:                  Expected Discharge Plan:  Home w Home Health Services  In-House Referral:     Discharge planning Services  CM Consult  Post Acute Care Choice:    Choice offered to:     DME Arranged:    DME Agency:     HH Arranged:    HH Agency:     Status of Service:     If discussed at [employer] Meetings, dates discussed:    Additional Comments: 05/27/2017  Discussed in LOS 05/27/17 - pt remains appropriate for continued stay - pt remains on milrinone and lasix drip.  Pt is still volume overload by approx 4 lbs.  Plan is to wean of drips and switch to PO within next 48 hours.  PT recommends no PT followup  05/22/17 Discussed in LOS 05/22/17 - remains appropriate for stay .  Pt remains volume overloaded.  Per attending pt may need CPAP - requested order  05/20/17 Discussed in LOS 7/10 - pt remains appropriate for continued stay.  Pt remains on milrinone and IV lasix drip - may require switch to dobutamine  05/13/17 CM confirmed with [facility] that prescription can be filled at discharge within normal operation hours  CM provided pt eliquis free 30 day card - benefit check submitted - pt has $0 copay and prior auth is not needed.  Pt states she uses [facility] and [facility] [address] if she discharges after 6pm during the week or on the weekend.  Pt is independent at home and states she is independently walking in room. Pt educated on daily weights and low salt diet restrictions.   Cherylann ParrClaxton, Chellsea Beckers S, RN 05/27/2017, 2:05 PM

## 2017-05-27 NOTE — Progress Notes (Signed)
Advanced HF Team Progress Note  Patient Name: Christine Knight Date of Encounter: 05/27/2017  Primary Cardiologist: D. Bensimhon, MD   Subjective   On 7/1/  RUE IV infiltrate.   Amio resumed 05/21/17 with RVR, but stopped that evening with AMS and severe fatigue. Similar reaction previously.   Dopamine started at 2.5 mcg/kg/min 05/22/17 with SBPs in 70s. Didn't help much so stopped on 7/13  TEE 05/26/17 with EF 25-30% with diffuse hypokinesis, moderately dilated RV with moderately decreased systolic function, moderate TR, mild to moderate MR, amorphous LAA thrombus. DCCV aborted.   Remains on milrinone 0.25 mcg/kg/min + IV lasix 30 mg hr + metolazone 5 mg BID + Diamox 250 mg BID.   Feeling tired today.  Had accident and soaked through unna boots with urine. Now off. Refuses replacement.   Coox 65.8% this am on above. CVP 12-13.  Weight at 204 lbs today. Baseline near 200.   Inpatient Medications    Scheduled Meds: . acetaZOLAMIDE  250 mg Oral BID  . amiodarone  200 mg Oral BID  . apixaban  5 mg Oral BID  . aspirin EC  81 mg Oral Daily  . atorvastatin  40 mg Oral q1800  . insulin aspart  0-20 Units Subcutaneous TID WC  . insulin aspart  0-5 Units Subcutaneous QHS  . insulin aspart  5 Units Subcutaneous TID WC  . insulin glargine  8 Units Subcutaneous Daily  . lactulose  20 g Oral BID  . levothyroxine  50 mcg Oral QAC breakfast  . mouth rinse  15 mL Mouth Rinse BID  . metolazone  5 mg Oral BID  . potassium chloride  40 mEq Oral TID  . pregabalin  100 mg Oral BID  . rifaximin  550 mg Oral BID  . spironolactone  25 mg Oral Daily   Continuous Infusions: . furosemide (LASIX) infusion 30 mg/hr (05/27/17 0800)  . milrinone 0.25 mcg/kg/min (05/27/17 0800)   PRN Meds: acetaminophen, magnesium hydroxide, ondansetron (ZOFRAN) IV, sodium chloride flush, traMADol   Vital Signs    Vitals:   05/26/17 1918 05/27/17 0010 05/27/17 0354 05/27/17 0700  BP: (!) 96/57 114/85 104/71     Pulse:  75    Resp: 13 17 13    Temp: 98.7 F (37.1 C) 98.2 F (36.8 C) (!) 97.5 F (36.4 C) 98.3 F (36.8 C)  TempSrc: Oral Oral Oral Oral  SpO2: 98% 100% 99%   Weight:   204 lb 11.2 oz (92.9 kg)   Height:        Intake/Output Summary (Last 24 hours) at 05/27/17 0913 Last data filed at 05/27/17 0800  Gross per 24 hour  Intake          1136.82 ml  Output             3125 ml  Net         -1988.18 ml   Filed Weights   05/25/17 0430 05/26/17 0418 05/27/17 0354  Weight: 216 lb (98 kg) 211 lb 12.8 oz (96.1 kg) 204 lb 11.2 oz (92.9 kg)    Physical Exam   CVP 12-13  General: Well appearing. No resp difficulty. HEENT: Normal Neck: Supple. JVP to ear. Carotids 2+ bilat; no bruits. No thyromegaly or nodule noted. Cor: PMI nondisplaced. IRR, IRR, 2/6 TR.  Lungs: CTAB, normal effort. Abdomen: Soft, non-tender, non-distended, no HSM. No bruits or masses. +BS  Extremities: No cyanosis, clubbing, or rahs. 2+ BLE edema.   Neuro: Alert & orientedx3, cranial  nerves grossly intact. moves all 4 extremities w/o difficulty. Affect pleasant   Labs    Chemistry  Recent Labs Lab 05/25/17 0430 05/26/17 0440 05/27/17 0432  NA 124* 129* 131*  K 3.6 3.5 3.2*  CL 83* 88* 86*  CO2 32 31 33*  GLUCOSE 144* 174* 105*  BUN 74* 76* 78*  CREATININE 1.75* 1.71* 1.60*  CALCIUM 8.5* 8.7* 9.2  GFRNONAA 29* 30* 33*  GFRAA 34* 35* 38*  ANIONGAP 9 10 12      Hematology  Recent Labs Lab 05/25/17 0430 05/26/17 0440 05/27/17 0432  WBC 4.1 4.9 4.4  RBC 4.25 4.59 4.40  HGB 11.8* 13.0 12.4  HCT 36.8 39.6 38.2  MCV 86.6 86.3 86.8  MCH 27.8 28.3 28.2  MCHC 32.1 32.8 32.5  RDW 16.7* 16.8* 16.7*  PLT 258 276 246    Cardiac Enzymes No results for input(s): TROPONINI in the last 168 hours.  No results for input(s): TROPIPOC in the last 168 hours.   BNP No results for input(s): BNP, PROBNP in the last 168 hours.   Radiology    No results found.   Telemetry    Personally reviewed,  Afib 80-90 + PVCs  Patient Profile     Christine Knight is a 66 y.o. female w/ PMHx significant for morbid obesity, systolic HF due to ICM (10/12 EF 25%; Echo 6/17 EF 15-20%), CAD with h/o NSTEMI s/p CABG Oct 2012, CKD III, severe pulmonary hypertension, PAF, dietary noncompliance, and poorly controlled DM 2, admitted 6/30 with acute on chronic HFrEF and wt gain.  Assessment & Plan    1. Acute on chronic biventricular HF (R>>L):  EF 15-20% 04/14/2016.  Presented 6/30 with marked volume overload and wt gain (up over 30 lbs since 04/16/17) in the setting of eating out frequently.  TEE 7/16 with EF 25-30% and moderate RV dysfunction.  -- Volume status remains somewhat elevated on milrinone and high-dose Lasix drip/diamox/metoalzone, but improved. -- Continue diuresis for today.  Will attempt transition off lasix gtt and back to po tomorrow.  -- Suspect AF is playing a major role here. Unable to cardiovert with new LAA thrombus -- Coox 65.8% this am on milrinone 0.25.  -- Creatinine stabilized but BUN continues to rise. Likely nearing as dry as we will get her.  -- Did not respond to dopamine. -- Hyponatremia improved. Got tolvaptan 7/14. No need to re-dose at this point.  -- No  blocker/acei/arb/arni due to soft bp's and low output. --Continue unna boots RLE and LLE. Changed 05/19/17  2.  RUE IV infiltration:  7/1 US was neg for dvt.   -- Much improved.    3.  CAD/Chest pressure/elevated troponin:  Mild trop elevation @ 0.05.   -- No CP. No change to medical management.  Known to have severe native CAD with patent grafts on cath 12/2015.  -- Cont ASA and statin.    4.  PAF:   -- Remains in afib.  Rate stable. Unable to DCCV with new LAA thrombus by TEE 05/26/17. -- Plan to continue Eliquis with re-attempt TEE/DCCV in 3-4 weeks.  -- Intolerant to IV amiodarone with severe fatigue. Now tolerating by mouth amiodarone 200 bid- we will continue -- Continue eliquis 5 mg twice a day.  (CHA2DS2VASc = 5). No  bleeding.  5.  Acute on CKD III:   -- Creatinine improving slowly. BUN continues to trend up.    -- K 3.2 will supp.   6.  DM II:   - Cont  SSI. Appreciate diabetes coordinator input. No change.   7.  OSA:  Severe OSA on sleep study 12/25/16.   -- Needs CPAP. No change.   8.  Morbid obesity: Body mass index is 33.04 kg/m. -- Needs outpatient dietary consultation. No change.   9. PVCs -- Goal K > 4.0 and Mg > 2.0 -- K being supped this am.     10. Hyponatremia: -- 131 this am. Continue free water restrict.   -- Given a dose of tolvaptan 7/14. Can redose as needed. Discussed with PharmD personally.   11. Hepatic encephalopathy -- Ammonia 41 05/22/17  -- Continue lactulose and xifaxin. No change.   12. DNR  Fluid coming off again. CVP down to 12-13. Continue IV diuretics at least one more day. Hope she can go home in 48 hrs or so.   [name], PA-C  05/27/2017, 9:13 AM    Advanced Heart Failure Team Pager [phone] (M-F; 7a - 4p)  Please contact [facility] for night-coverage after hours (4p -7a ) and weekends on [email or web address]  Patient seen with PA, agree with the above note.  She is still volume overloaded but weight and CVP coming down.  CVP 12-13 today.  Good co-ox on milrinone.  - Continue current milrinone gtt and continue current diuretic regimen for one more day.  Probably transition to po and titrate off milrinone tomorrow.   She remains persistently in atrial fibrillation but rate is controlled. Unable to do DCCV due to LA thrombus.   - Continue Eliquis.  - Continue amiodarone.  - Repeat TEE in 3-4 weeks, if thrombus resolved can do DCCV at that time.   Marca Ancona 05/27/2017 1:39 PM

## 2017-05-27 NOTE — Progress Notes (Signed)
Orthopedic Tech Progress Note Patient Details:  Christine Knight Dec 13, 1950 [id number]  Patient ID: Christine Knight, female   DOB: Dec 13, 1950, 66 y.o.   MRN: [id number]   Nikki DomCrawford, [name] 05/27/2017, 8:55 AM Pt's RN Jeanice LimHolly called and stated that pt's unna boots were covered in urine and needed to be removed and replaced. I removed the unna boots and afterwards pt stated that she did not want them reapplied. I immediately reported this to the [employer] Holly.

## 2017-05-27 NOTE — Progress Notes (Signed)
Ortho Tech called for [name] boot weekly change.  Last change was on 7/9.  Ortho tech to floor and unna boots changed.  Assisted pt to BR one assist.  Pt little less drowsy tonight and wanting to walk to bathroom.  Will continue to encourage and monitor. [name], [name] T

## 2017-05-27 NOTE — Progress Notes (Signed)
Orthopedic Tech Progress Note Patient Details:  Christine Knight [birth year] [id number]  Ortho Devices Type of Ortho Device: Roland RackUnna boot Ortho Device/Splint Location: bilateral Ortho Device/Splint Interventions: Application   Irish LackGriffin, Sarra Rachels Ray 05/27/2017, 7:04 AM

## 2017-05-28 LAB — GLUCOSE, CAPILLARY
GLUCOSE-CAPILLARY: 161 mg/dL — AB (ref 65–99)
GLUCOSE-CAPILLARY: 60 mg/dL — AB (ref 65–99)
GLUCOSE-CAPILLARY: 77 mg/dL (ref 65–99)
GLUCOSE-CAPILLARY: 261 mg/dL — AB (ref 65–99)
Glucose-Capillary: 202 mg/dL — ABNORMAL HIGH (ref 65–99)

## 2017-05-28 LAB — COOXEMETRY PANEL
Carboxyhemoglobin: 1.4 % (ref 0.5–1.5)
Methemoglobin: 1 % (ref 0.0–1.5)
O2 SAT: 54.1 %
TOTAL HEMOGLOBIN: 13.5 g/dL (ref 12.0–16.0)

## 2017-05-28 LAB — CBC
HEMATOCRIT: 36.7 % (ref 36.0–46.0)
Hemoglobin: 11.8 g/dL — ABNORMAL LOW (ref 12.0–15.0)
MCH: 28 pg (ref 26.0–34.0)
MCHC: 32.2 g/dL (ref 30.0–36.0)
MCV: 87 fL (ref 78.0–100.0)
PLATELETS: 280 10*3/uL (ref 150–400)
RBC: 4.22 MIL/uL (ref 3.87–5.11)
RDW: 17 % — AB (ref 11.5–15.5)
WBC: 4.4 10*3/uL (ref 4.0–10.5)

## 2017-05-28 LAB — BASIC METABOLIC PANEL
Anion gap: 10 (ref 5–15)
BUN: 76 mg/dL — AB (ref 6–20)
CO2: 32 mmol/L (ref 22–32)
CREATININE: 1.57 mg/dL — AB (ref 0.44–1.00)
Calcium: 8.4 mg/dL — ABNORMAL LOW (ref 8.9–10.3)
Chloride: 85 mmol/L — ABNORMAL LOW (ref 101–111)
GFR calc Af Amer: 39 mL/min — ABNORMAL LOW (ref >60)
GFR, EST NON AFRICAN AMERICAN: 34 mL/min — AB (ref >60)
GLUCOSE: 269 mg/dL — AB (ref 65–99)
Potassium: 3.1 mmol/L — ABNORMAL LOW (ref 3.5–5.1)
Sodium: 127 mmol/L — ABNORMAL LOW (ref 135–145)

## 2017-05-28 LAB — AMMONIA: Ammonia: 64 umol/L — ABNORMAL HIGH (ref 9–35)

## 2017-05-28 MED ORDER — TORSEMIDE 20 MG PO TABS
100.0000 mg | ORAL_TABLET | Freq: Two times a day (BID) | ORAL | Status: DC
  Administered 2017-05-28: 100 mg via ORAL
  Filled 2017-05-28: qty 5

## 2017-05-28 MED ORDER — POTASSIUM CHLORIDE CRYS ER 20 MEQ PO TBCR
40.0000 meq | EXTENDED_RELEASE_TABLET | Freq: Once | ORAL | Status: AC
  Administered 2017-05-28: 40 meq via ORAL
  Filled 2017-05-28: qty 2

## 2017-05-28 MED ORDER — LACTULOSE 10 GM/15ML PO SOLN
30.0000 g | Freq: Three times a day (TID) | ORAL | Status: DC
  Administered 2017-05-28 – 2017-05-30 (×6): 30 g via ORAL
  Filled 2017-05-28 (×7): qty 45

## 2017-05-28 MED ORDER — SODIUM CHLORIDE 0.9% FLUSH: 10.0000 mL | INTRAVENOUS | Status: DC | PRN

## 2017-05-28 MED ORDER — METOLAZONE 5 MG PO TABS
5.0000 mg | ORAL_TABLET | Freq: Once | ORAL | Status: AC
  Administered 2017-05-28: 5 mg via ORAL
  Filled 2017-05-28: qty 1

## 2017-05-28 NOTE — Progress Notes (Signed)
Advanced HF Team Progress Note  Patient Name: Christine BabeBarbara Knight Date of Encounter: 05/28/2017  Primary Cardiologist: D. Bensimhon, MD   Subjective   On 7/1/  RUE IV infiltrate.   Amio resumed 05/21/17 with RVR, but stopped that evening with AMS and severe fatigue. Similar reaction previously.   Dopamine started at 2.5 mcg/kg/min 05/22/17 with SBPs in 70s. Didn't help much so stopped on 7/13  TEE 05/26/17 with EF 25-30% with diffuse hypokinesis, moderately dilated RV with moderately decreased systolic function, moderate TR, mild to moderate MR, amorphous LAA thrombus. DCCV aborted.   Remains on milrinone 0.25 mcg/kg/min + IV lasix 30 mg hr + metolazone 5 mg BID + Diamox 250 mg BID.   Feeling tired this am. Denies SOB. Feels weak in the legs with all of the fluid off.   Coox 54.1% this on above. CVP 9-10. Weight 203 lbs.    Inpatient Medications    Scheduled Meds: . acetaZOLAMIDE  250 mg Oral BID  . amiodarone  200 mg Oral BID  . apixaban  5 mg Oral BID  . aspirin EC  81 mg Oral Daily  . atorvastatin  40 mg Oral q1800  . insulin aspart  0-20 Units Subcutaneous TID WC  . insulin aspart  0-5 Units Subcutaneous QHS  . insulin aspart  5 Units Subcutaneous TID WC  . insulin glargine  8 Units Subcutaneous Daily  . lactulose  20 g Oral BID  . levothyroxine  50 mcg Oral QAC breakfast  . mouth rinse  15 mL Mouth Rinse BID  . metolazone  5 mg Oral BID  . potassium chloride  40 mEq Oral TID  . pregabalin  100 mg Oral BID  . rifaximin  550 mg Oral BID  . spironolactone  25 mg Oral Daily   Continuous Infusions: . furosemide (LASIX) infusion 30 mg/hr (05/28/17 0630)  . milrinone 0.25 mcg/kg/min (05/28/17 0630)   PRN Meds: acetaminophen, magnesium hydroxide, ondansetron (ZOFRAN) IV, sodium chloride flush, sodium chloride flush, traMADol   Vital Signs    Vitals:   05/27/17 1800 05/27/17 2245 05/28/17 0258 05/28/17 0735  BP: 91/64 113/78 97/73 100/68  Pulse:    90  Resp: 13 (!) 23  15 12   Temp:  97.8 F (36.6 C) (!) 97.5 F (36.4 C) 97.6 F (36.4 C)  TempSrc:  Oral Oral Oral  SpO2:  94% 95% 100%  Weight:   203 lb 14.8 oz (92.5 kg)   Height:        Intake/Output Summary (Last 24 hours) at 05/28/17 0857 Last data filed at 05/28/17 0805  Gross per 24 hour  Intake          1544.41 ml  Output             4100 ml  Net         -2555.59 ml   Filed Weights   05/26/17 0418 05/27/17 0354 05/28/17 0258  Weight: 211 lb 12.8 oz (96.1 kg) 204 lb 11.2 oz (92.9 kg) 203 lb 14.8 oz (92.5 kg)    Physical Exam   CVP 9-10  General: fatigued appearing. NAD.  HEENT: Normal Neck: Supple. JVP 9-10. Carotids 2+ bilat; no bruits. No thyromegaly or nodule noted. Cor: PMI nondisplaced. RRR, No M/G/R noted Lungs: CTAB, normal effort. Abdomen: Obese, soft, non-tender, non-distended, no HSM. No bruits or masses. +BS  Extremities: No cyanosis, clubbing, or rash. Trace to 1+ BLE edema.   Neuro: Alert & orientedx3, cranial nerves grossly intact. moves all  4 extremities w/o difficulty. Affect pleasant   Labs    Chemistry  Recent Labs Lab 05/26/17 0440 05/27/17 0432 05/28/17 0450  NA 129* 131* 127*  K 3.5 3.2* 3.1*  CL 88* 86* 85*  CO2 31 33* 32  GLUCOSE 174* 105* 269*  BUN 76* 78* 76*  CREATININE 1.71* 1.60* 1.57*  CALCIUM 8.7* 9.2 8.4*  GFRNONAA 30* 33* 34*  GFRAA 35* 38* 39*  ANIONGAP 10 12 10      Hematology  Recent Labs Lab 05/26/17 0440 05/27/17 0432 05/28/17 0450  WBC 4.9 4.4 4.4  RBC 4.59 4.40 4.22  HGB 13.0 12.4 11.8*  HCT 39.6 38.2 36.7  MCV 86.3 86.8 87.0  MCH 28.3 28.2 28.0  MCHC 32.8 32.5 32.2  RDW 16.8* 16.7* 17.0*  PLT 276 246 280    Cardiac Enzymes No results for input(s): TROPONINI in the last 168 hours.  No results for input(s): TROPIPOC in the last 168 hours.   BNP No results for input(s): BNP, PROBNP in the last 168 hours.   Radiology    No results found.   Telemetry    Personally reviewed, Afib 80-90s with PVCs.    Patient Profile     Christine Knight is a 66 y.o. female w/ PMHx significant for morbid obesity, systolic HF due to ICM (10/12 EF 25%; Echo 6/17 EF 15-20%), CAD with h/o NSTEMI s/p CABG Oct 2012, CKD III, severe pulmonary hypertension, PAF, dietary noncompliance, and poorly controlled DM 2, admitted 6/30 with acute on chronic HFrEF and wt gain.  Assessment & Plan    1. Acute on chronic biventricular HF (R>>L):  EF 15-20% 04/14/2016.  Presented 6/30 with marked volume overload and wt gain (up over 30 lbs since 04/16/17) in the setting of eating out frequently.  TEE 7/16 with EF 25-30% and moderate RV dysfunction.  -- Volume status improved. CVP now 9-10.  Will continue lasix gtt this am, and transition to torsemide this evening.  -- Pt was on torsemide 80 mg BID at home. Will increase to 100 mg BID.  -- Suspect AF is playing a major role here. Unable to cardiovert with new LAA thrombus. Plan re-attempt in 4-6 weeks. -- Coox 54% on milrinone 0.25. Will continue overnight and plan on stopping in am for home.  -- Creatinine and BUN stable.   -- Did not respond to dopamine. -- Hyponatremia improved. Got tolvaptan 7/14. No need to re-dose at this point.  -- No  blocker/acei/arb/arni due to soft bp's and low output. --Continue unna boots RLE and LLE. Changed 05/19/17  2.  RUE IV infiltration:  7/1 [place] was neg for dvt.   -- Much improved. No change.   3.  CAD/Chest pressure/elevated troponin:  Mild trop elevation @ 0.05.   -- No CP. No change to medical management. Known to have severe native CAD with patent grafts on cath 12/2015.  -- Cont ASA and statin.    4.  PAF:   -- Remains in afib.  Rate stable. Unable to DCCV with new LAA thrombus by TEE 05/26/17. -- Plan to continue Eliquis with re-attempt TEE/DCCV in 3-4 weeks.  -- Intolerant to IV amiodarone with severe fatigue. Now tolerating by mouth amiodarone 200 bid- we will continue -- Continue eliquis 5 mg twice a day.  (CHA2DS2VASc = 5). No bleeding.    5.  Acute on CKD III:   -- Creatinine stable. BUN stabilized over past few days. -- K 3.1 will supp.   6.  DM II:   -  Cont SSI. Appreciate diabetes coordinator input. No change.    7.  OSA:  Severe OSA on sleep study 12/25/16.   -- Needs CPAP. No change.   8.  Morbid obesity: Body mass index is 32.91 kg/m. -- Needs outpatient dietary consultation. No change.    9. PVCs -- Goal K > 4.0 and Mg > 2.0 -- K being supped this am.      10. Hyponatremia: -- 131 this am. Continue free water restrict.   -- Given a dose of tolvaptan 7/14. Can redose as needed. Discussed with PharmD personally.   11. Hepatic encephalopathy -- Ammonia 41 05/22/17. Recheck today. Fatigued from being up using the restroom, but mild confusion (Couldn't remember what county she [place] in) -- Continue lactulose and xifaxin. No change.    12. DNR  Transition to po diuretics this evening with torsemide 100 mg BID.   [name], PA-C  05/28/2017, 8:57 AM    Advanced Heart Failure Team Pager [phone] (M-F; 7a - 4p)  Please contact [facility] for night-coverage after hours (4p -7a ) and weekends on [email or web address]  Patient seen with PA, agree with the above note.  Weight down > 30 lbs now and CVP in the 9-10 range.  Creatinine stable.  Still mild volume overload but I think that we can begin transitioning her over to po meds.  - Torsemide 100 mg bid.  - Continue milrinone today, hopefully can come off this tomorrow morning.  - Possibly home on Friday.   She feels "washed out" and "slow."  Ammonia is elevated, hepatic encephalopathy in the setting of congestive hepatopathy may be playing a role in this.  - Continue rifaximin. - Increase lactulose to 30 cc tid.   She remains in atrial fibrillation.  Will need 4 wks Eliquis then TEE-DCCV (LA thrombus).  Rate is reasonable.  Can stop ASA 81 given stable CAD and use of Eliquis.    Marca Ancona 05/28/2017

## 2017-05-28 NOTE — Progress Notes (Signed)
Paged MD regarding patient's somnolence r/t amiodarone.  Received return page to stick with amio PO for now. Will continue to monitor.

## 2017-05-28 NOTE — Progress Notes (Signed)
Patient's CBG 60- given orange juice & PB crackers. Will recheck and hold insulin.

## 2017-05-28 NOTE — Progress Notes (Deleted)
NP paged about elevated Lactic Acid and order received for NS bolus and followed I will continue to monitor.

## 2017-05-28 NOTE — Care Management Note (Signed)
Case Management Note  Patient Details  Name: Lum BabeBarbara Michna MRN: [id number] Date of Birth: 1951/09/04  Subjective/Objective:    Pt admitted with HF                Action/Plan:   PTA from home.  [facility] - [facility] and PT.  CM submitted benefit check for eliquis   Expected Discharge Date:                  Expected Discharge Plan:  Home w Home Health Services  In-House Referral:     Discharge planning Services  CM Consult  Post Acute Care Choice:    Choice offered to:     DME Arranged:    DME Agency:     HH Arranged:    HH Agency:     Status of Service:     If discussed at [employer] Meetings, dates discussed:    Additional Comments: 05/28/2017  Pt is less alert and per bedside nurse mental status has been deteroiating over last week with PO Amio - pt now being recommended for SNF - CSW consulted.  HF team restricted on medications for A fib due to HF.  CM will continue to follow for discharge needs  05/27/17 Discussed in LOS 05/27/17 - pt remains appropriate for continued stay - pt remains on milrinone and lasix drip.  Pt is still volume overload by approx 4 lbs.  Plan is to wean of drips and switch to PO within next 48 hours.  PT recommends no PT followup  05/22/17 Discussed in LOS 05/22/17 - remains appropriate for stay .  Pt remains volume overloaded.  Per attending pt may need CPAP - requested order  05/20/17 Discussed in LOS 7/10 - pt remains appropriate for continued stay.  Pt remains on milrinone and IV lasix drip - may require switch to dobutamine  05/13/17 CM confirmed with [facility] that prescription can be filled at discharge within normal operation hours  CM provided pt eliquis free 30 day card - benefit check submitted - pt has $0 copay and prior auth is not needed.  Pt states she uses [facility] and [facility] [address] if she discharges after 6pm  during the week or on the weekend.  Pt is independent at home and states she is independently walking in room. Pt educated on daily weights and low salt diet restrictions.   Cherylann ParrClaxton, Nerida Boivin S, RN 05/28/2017, 1:36 PM

## 2017-05-28 NOTE — Progress Notes (Signed)
Physical Therapy Treatment Patient Details Name: Christine BabeBarbara Stoutenburg MRN: [id number] DOB: 1951-06-17 Today's Date: 05/28/2017    History of Present Illness Patient is a 66 y/o female who presents with heart failure and volume overload. CXR-Small left pleural effusion. Recently admitted 4/24-5/3 for fluid overload. PMH includes morbid obesity, systolic HF due to ICM, CAD with hx of NSTEMI s/p CABG Oct 2012, pulmonary HTN, DM.    PT Comments    Pt admitted with above diagnosis. Pt currently with functional limitations due to balance and endurance deficits. Pt was able to ambulate today but limited more than past treatments as she needed RW and needed cues and assist for safety.  Pt not as alert and just fatigues quickly.  BP stable with treatment today as well as HR.  Feel that pt may need SNF for therapy prior to d/c home as this PT does not feel she will be safe to go home at d/c without 24 hour care at current level.   Pt will benefit from skilled PT to increase their independence and safety with mobility to allow discharge to the venue listed below.     Follow Up Recommendations  SNF;Supervision/Assistance - 24 hour     Equipment Recommendations  Rolling walker with 5" wheels    Recommendations for Other Services OT consult     Precautions / Restrictions Precautions Precautions: Fall Restrictions Weight Bearing Restrictions: No    Mobility  Bed Mobility Overal bed mobility: Needs Assistance Bed Mobility: Supine to Sit     Supine to sit: Min guard     General bed mobility comments: Needing guard assist for safety coming to EOB as pt lethargic.  Transfers Overall transfer level: Needs assistance Equipment used: Rolling walker (2 wheeled) Transfers: Sit to/from Stand Sit to Stand: Min guard;Min assist         General transfer comment: increased time for powerup, guard assist for steadying as takes incr time due to pt weak.  Ambulation/Gait Ambulation/Gait assistance:  Min guard;Min assist Ambulation Distance (Feet): 110 Feet Assistive device: Rolling walker (2 wheeled) Gait Pattern/deviations: Decreased stride length;Decreased stance time - right;Shuffle;Step-to pattern;Trunk flexed;Wide base of support;Drifts right/left Gait velocity: decreased Gait velocity interpretation: Below normal speed for age/gender General Gait Details: Pt was able to ambulate in hallway needing cues to sequence steps and rW.  Occasional assist and cues to steer rW.     Stairs            Wheelchair Mobility    Modified Rankin (Stroke Patients Only)       Balance Overall balance assessment: Needs assistance Sitting-balance support: Feet supported;No upper extremity supported Sitting balance-[name] Scale: Good     Standing balance support: Bilateral upper extremity supported;During functional activity Standing balance-[name] Scale: Fair Standing balance comment: can stand staticaly without RW but relies on RW with dynamic activity.                             Cognition Arousal/Alertness: Lethargic Behavior During Therapy: Flat affect Overall Cognitive Status: Impaired/Different from baseline Area of Impairment: Problem solving;Safety/judgement;Following commands                       Following Commands: Follows one step commands with increased time Safety/Judgement: Decreased awareness of safety;Decreased awareness of deficits   Problem Solving: Slow processing;Decreased initiation;Difficulty sequencing;Requires verbal cues;Requires tactile cues        Exercises General Exercises - Lower Extremity Ankle Circles/Pumps:  AROM;Both;5 reps;Seated Quad Sets: AROM;Both;5 reps;Seated Long Arc Quad: AROM;Both;10 reps Hip Flexion/Marching: AROM;Both;10 reps;Seated    General Comments        Pertinent Vitals/Pain Pain Assessment: Faces Faces Pain Scale: Hurts even more Pain Location: LLE Pain Descriptors / Indicators: Sore;Aching Pain  Intervention(s): Limited activity within patient's tolerance;Monitored during session;Premedicated before session;Repositioned   83-104 bpm with activity, 104/59 was intiial BP. Sitting 90/59 but not symptomatic.  Standing BP 127/89.  After treatment BP 119/78.   Home Living                      Prior Function            PT Goals (current goals can now be found in the care plan section) Progress towards PT goals: Progressing toward goals    Frequency    Min 3X/week      PT Plan Discharge plan needs to be updated    Co-evaluation              AM-PAC PT "6 Clicks" Daily Activity  Outcome Measure  Difficulty turning over in bed (including adjusting bedclothes, sheets and blankets)?: A Little Difficulty moving from lying on back to sitting on the side of the bed? : A Little Difficulty sitting down on and standing up from a chair with arms (e.g., wheelchair, bedside commode, etc,.)?: A Little Help needed moving to and from a bed to chair (including a wheelchair)?: A Little Help needed walking in hospital room?: A Little Help needed climbing 3-5 steps with a railing? : A Lot 6 Click Score: 17    End of Session Equipment Utilized During Treatment: Gait belt Activity Tolerance: Patient tolerated treatment well;Patient limited by fatigue Patient left: with call bell/phone within reach;with chair alarm set;in chair Nurse Communication: Mobility status PT Visit Diagnosis: Unsteadiness on feet (R26.81)     Time: 1610-9604 PT Time Calculation (min) (ACUTE ONLY): 31 min  Charges:  $Gait Training: 23-37 mins                    G Codes:       Aurelia Gras,PT Acute Rehabilitation [phone] (pager)    Berline Lopes 05/28/2017, 12:09 PM

## 2017-05-29 LAB — AMMONIA: AMMONIA: 66 umol/L — AB (ref 9–35)

## 2017-05-29 LAB — CBC
HCT: 40.3 % (ref 36.0–46.0)
Hemoglobin: 13.1 g/dL (ref 12.0–15.0)
MCH: 28 pg (ref 26.0–34.0)
MCHC: 32.5 g/dL (ref 30.0–36.0)
MCV: 86.1 fL (ref 78.0–100.0)
Platelets: 262 10*3/uL (ref 150–400)
RBC: 4.68 MIL/uL (ref 3.87–5.11)
RDW: 16.8 % — AB (ref 11.5–15.5)
WBC: 4.7 10*3/uL (ref 4.0–10.5)

## 2017-05-29 LAB — COOXEMETRY PANEL
CARBOXYHEMOGLOBIN: 1.4 % (ref 0.5–1.5)
Carboxyhemoglobin: 1.7 % — ABNORMAL HIGH (ref 0.5–1.5)
METHEMOGLOBIN: 0.9 % (ref 0.0–1.5)
Methemoglobin: 0.7 % (ref 0.0–1.5)
O2 Saturation: 63 %
O2 Saturation: 51.5 %
TOTAL HEMOGLOBIN: 13.6 g/dL (ref 12.0–16.0)
Total hemoglobin: 14.3 g/dL (ref 12.0–16.0)

## 2017-05-29 LAB — GLUCOSE, CAPILLARY
GLUCOSE-CAPILLARY: 179 mg/dL — AB (ref 65–99)
GLUCOSE-CAPILLARY: 159 mg/dL — AB (ref 65–99)
Glucose-Capillary: 190 mg/dL — ABNORMAL HIGH (ref 65–99)
Glucose-Capillary: 118 mg/dL — ABNORMAL HIGH (ref 65–99)

## 2017-05-29 LAB — BASIC METABOLIC PANEL
Anion gap: 11 (ref 5–15)
BUN: 80 mg/dL — ABNORMAL HIGH (ref 6–20)
CALCIUM: 9.2 mg/dL (ref 8.9–10.3)
CO2: 33 mmol/L — ABNORMAL HIGH (ref 22–32)
CREATININE: 1.85 mg/dL — AB (ref 0.44–1.00)
Chloride: 85 mmol/L — ABNORMAL LOW (ref 101–111)
GFR, EST AFRICAN AMERICAN: 32 mL/min — AB (ref >60)
GFR, EST NON AFRICAN AMERICAN: 27 mL/min — AB (ref >60)
Glucose, Bld: 192 mg/dL — ABNORMAL HIGH (ref 65–99)
Potassium: 4.1 mmol/L (ref 3.5–5.1)
SODIUM: 129 mmol/L — AB (ref 135–145)

## 2017-05-29 MED ORDER — AMIODARONE HCL 200 MG PO TABS
200.0000 mg | ORAL_TABLET | Freq: Every day | ORAL | Status: DC
  Administered 2017-05-29 – 2017-05-30 (×2): 200 mg via ORAL
  Filled 2017-05-29: qty 1

## 2017-05-29 MED ORDER — MILRINONE LACTATE IN DEXTROSE 20-5 MG/100ML-% IV SOLN
0.1250 ug/kg/min | INTRAVENOUS | Status: DC
  Administered 2017-05-29 – 2017-05-30 (×2): 0.125 ug/kg/min via INTRAVENOUS
  Filled 2017-05-29: qty 100

## 2017-05-29 MED ORDER — TORSEMIDE 20 MG PO TABS
100.0000 mg | ORAL_TABLET | Freq: Two times a day (BID) | ORAL | Status: DC
  Administered 2017-05-29: 100 mg via ORAL
  Filled 2017-05-29: qty 5

## 2017-05-29 NOTE — Progress Notes (Signed)
Advanced HF Team Progress Note  Patient Name: Christine BabeBarbara Knight Date of Encounter: 05/29/2017  Primary Cardiologist: D. Bensimhon, MD   Subjective   On 7/1/  RUE IV infiltrate.   Amio resumed 05/21/17 with RVR, but stopped that evening with AMS and severe fatigue. Similar reaction previously.   Dopamine started at 2.5 mcg/kg/min 05/22/17 with SBPs in 70s. Didn't help much so stopped on 7/13  TEE 05/26/17 with EF 25-30% with diffuse hypokinesis, moderately dilated RV with moderately decreased systolic function, moderate TR, mild to moderate MR, amorphous LAA thrombus. DCCV aborted.   Remains on milrinone 0.25 mcg/kg/min. Transitioned to torsemide last night.   Feeling better this am. Still feels weak in the legs but improving. She is open to considering SNF vs LTAC.   Coox 63.0 on milrinone 0.25 mcg/kg/min. CVP ~9. Weight 201 lbs ( Her baseline).   Inpatient Medications    Scheduled Meds: . amiodarone  200 mg Oral BID  . apixaban  5 mg Oral BID  . aspirin EC  81 mg Oral Daily  . atorvastatin  40 mg Oral q1800  . insulin aspart  0-20 Units Subcutaneous TID WC  . insulin aspart  0-5 Units Subcutaneous QHS  . insulin aspart  5 Units Subcutaneous TID WC  . insulin glargine  8 Units Subcutaneous Daily  . lactulose  30 g Oral TID  . levothyroxine  50 mcg Oral QAC breakfast  . mouth rinse  15 mL Mouth Rinse BID  . pregabalin  100 mg Oral BID  . rifaximin  550 mg Oral BID  . spironolactone  25 mg Oral Daily   Continuous Infusions: . milrinone     PRN Meds: acetaminophen, magnesium hydroxide, ondansetron (ZOFRAN) IV, sodium chloride flush, sodium chloride flush, traMADol   Vital Signs    Vitals:   05/29/17 0300 05/29/17 0314 05/29/17 0440 05/29/17 0700  BP: 111/72   (!) 87/60  Pulse:      Resp: (!) 22 17 13 14   Temp:  98.2 F (36.8 C)  98.2 F (36.8 C)  TempSrc:  Oral  Oral  SpO2:  98% (!) 78% 95%  Weight:  201 lb 1 oz (91.2 kg)    Height:        Intake/Output  Summary (Last 24 hours) at 05/29/17 0915 Last data filed at 05/29/17 0300  Gross per 24 hour  Intake            993.9 ml  Output              500 ml  Net            493.9 ml   Filed Weights   05/27/17 0354 05/28/17 0258 05/29/17 0314  Weight: 204 lb 11.2 oz (92.9 kg) 203 lb 14.8 oz (92.5 kg) 201 lb 1 oz (91.2 kg)    Physical Exam   CVP ~9  General: Fatigued appearing. NAD. Seated in chair.  HEENT: Normal Neck: Supple. JVP 8-9. Carotids 2+ bilat; no bruits. No thyromegaly or nodule noted. Cor: PMI nondisplaced. Irregular. No M/G/R noted.  Lungs: CTAB, normal effort. Abdomen: Obese, soft, non-tender, non-distended, no HSM. No bruits or masses. +BS  Extremities: No cyanosis, clubbing, or rash. No edema.  Neuro: Alert & orientedx3, cranial nerves grossly intact. moves all 4 extremities w/o difficulty. Affect pleasant   Labs    Chemistry  Recent Labs Lab 05/27/17 0432 05/28/17 0450 05/29/17 0435  NA 131* 127* 129*  K 3.2* 3.1* 4.1  CL 86* 85*  85*  CO2 33* 32 33*  GLUCOSE 105* 269* 192*  BUN 78* 76* 80*  CREATININE 1.60* 1.57* 1.85*  CALCIUM 9.2 8.4* 9.2  GFRNONAA 33* 34* 27*  GFRAA 38* 39* 32*  ANIONGAP 12 10 11      Hematology  Recent Labs Lab 05/27/17 0432 05/28/17 0450 05/29/17 0435  WBC 4.4 4.4 4.7  RBC 4.40 4.22 4.68  HGB 12.4 11.8* 13.1  HCT 38.2 36.7 40.3  MCV 86.8 87.0 86.1  MCH 28.2 28.0 28.0  MCHC 32.5 32.2 32.5  RDW 16.7* 17.0* 16.8*  PLT 246 280 262    Cardiac Enzymes No results for input(s): TROPONINI in the last 168 hours.  No results for input(s): TROPIPOC in the last 168 hours.   BNP No results for input(s): BNP, PROBNP in the last 168 hours.   Radiology    No results found.   Telemetry    Personally reviewed, Afib 80-90s.    Patient Profile     Christine Knight is a 66 y.o. female w/ PMHx significant for morbid obesity, systolic HF due to ICM (10/12 EF 25%; Echo 6/17 EF 15-20%), CAD with h/o NSTEMI s/p CABG Oct 2012, CKD III,  severe pulmonary hypertension, PAF, dietary noncompliance, and poorly controlled DM 2, admitted 6/30 with acute on chronic HFrEF and wt gain.  Assessment & Plan    1. Acute on chronic biventricular HF (R>>L):  EF 15-20% 04/14/2016.  Presented 6/30 with marked volume overload and wt gain (up over 30 lbs since 04/16/17) in the setting of eating out frequently.  TEE 7/16 with EF 25-30% and moderate RV dysfunction.  -- Volume status back to baseline. Creatinine up slightly - CVP ~ 9. Hold morning torsemide and restart this pm at 100 mg BID.  -- Suspect AF is playing a major role here. Unable to cardiovert with new LAA thrombus. Plan re-attempt in 4-6 weeks. -- Coox 63% on milrinone 0.25. Will wean to 0.125 as do not plan for home.  -- Creatinine and BUN stable.   -- Did not respond to dopamine. -- Hyponatremia has improved overall. Got tolvaptan 7/14. No need to re-dose at this point.  -- No  blocker/acei/arb/arni due to soft bp's and low output.  2.  RUE IV infiltration:  7/1 [place] was neg for dvt.   -- Resolved.    3.  CAD/Chest pressure/elevated troponin:  Mild trop elevation @ 0.05.   -- Denies CP. No change to medical management. Known to have severe native CAD with patent grafts on cath 12/2015.  -- Cont ASA and statin.    4.  PAF:   -- Remains in afib.  Rate stable. Unable to DCCV with new LAA thrombus by TEE 05/26/17. -- Plan to continue Eliquis with re-attempt TEE/DCCV in 3-4 weeks.  -- Intolerant to IV amiodarone with severe fatigue. Now tolerating by mouth amiodarone 200 bid- we will continue -- Continue eliquis 5 mg twice a day.  (CHA2DS2VASc = 5). No bleeding. No change..   5.  Acute on CKD III:   -- Creatinine elevated today. Holding morning torsemide. Pt is down a total of 40 lbs this admit.  -- K 4.1. Will adjust supp with holding morning torsemide.   6.  DM II:   - Cont SSI. Appreciate diabetes coordinator input. No change.   7.  OSA:  Severe OSA on sleep study 12/25/16.   --  Needs CPAP. No chane.   8.  Morbid obesity: Body mass index is 32.45 kg/m. -- Needs outpatient  dietary consultation. No change.   9. PVCs -- Goal K > 4.0 and Mg > 2.0 Stable. No change   10. Hyponatremia: -- 129 this am. Continue free water restrict. Again counseled on fluid restriction at home.    -- Given a dose of tolvaptan 7/14. Can redose as needed. Discussed with PharmD personally.   11. Hepatic encephalopathy -- Ammonia 66 yesterday. Lactulose increased.  -- Continue lactulose and xifaxin. No change.    12. DNR  Wean milrinone. Hold morning torsemide. Hopefully to SNF vs LTAC tomorrow.   Christine Freer, PA-C  05/29/2017, 9:15 AM    Advanced Heart Failure Team Pager [phone] (M-F; 7a - 4p)  Please contact [facility] for night-coverage after hours (4p -7a ) and weekends on [email or web address]  Patient seen with PA, agree with the above note.  Weight down > 30 lbs now and CVP 9.  Creatinine stable up slightly to 1.85.  - With rise in creatinine, will hold am torsemide then continue torsemide 100 mg bid this pm.   - Good co-ox, decrease milrinone to 0.125 today and repeat co-ox this afternoon.  May stop completely later today.    Still feels bad diffusely, says this never changes while she's in the hospital.  Ammonia is elevated, hepatic encephalopathy in the setting of congestive hepatopathy may be playing a role in this.  She also has had trouble with amiodarone in the past (thinks it makes her feel bad).  - Continue rifaximin and lactulose. - I will cut the amiodarone down to once a day.   She remains in atrial fibrillation.  Will need 4 wks Eliquis then TEE-DCCV (LA thrombus).  Rate is reasonable.  Can stop ASA 81 given stable CAD and use of Eliquis.  As above, will cut down to low dose amiodarone.  Suspect she will have to continue some amiodarone to give her a chance of eventually getting out of afib but will try to minimize dose.   PT recommend SNF or LTAC.  Will  try to arrange, possibly tomorrow.   Christine Knight 05/29/2017 9:30 AM

## 2017-05-29 NOTE — Care Management Note (Addendum)
Case Management Note  Patient Details  Name: Christine BabeBarbara Schewe MRN: [id number] Date of Birth: 01-Jan-1951  Subjective/Objective:    Pt admitted with HF                Action/Plan:   PTA from home.  [facility] - RN and PT.  CM submitted benefit check for eliquis   Expected Discharge Date:                  Expected Discharge Plan:  Home w Home Health Services  In-House Referral:     Discharge planning Services  CM Consult  Post Acute Care Choice:    Choice offered to:     DME Arranged:    DME Agency:     HH Arranged:    HH Agency:     Status of Service:     If discussed at [employer] Meetings, dates discussed:    Additional Comments: 05/29/2017  Pt is currently weaning off of milrinone - all other IV medications discontinued.  LTACH referral requested by both Physician Advisor and attending - both agencies given referral - [facility] declined to offer bed, [facility] will offer bed.  [facility] spoke with pt [facility]  [facility] will accept the pt on the basis; pt needs close monitoring for HF and will likely require reinitiation of inotrope/IV diuretics  05/28/17 Pt is less alert and per bedside nurse mental status has been deteroiating over last week with PO Amio - pt now being recommended for SNF - CSW consulted.  HF team restricted on medications for A fib due to HF.  CM will continue to follow for discharge needs  05/27/17 Discussed in LOS 05/27/17 - pt remains appropriate for continued stay - pt remains on milrinone and lasix drip.  Pt is still volume overload by approx 4 lbs.  Plan is to wean of drips and switch to PO within next 48 hours.  PT recommends no PT followup  05/22/17 Discussed in LOS 05/22/17 - remains appropriate for stay .  Pt remains volume overloaded.  Per attending pt may need CPAP - requested order  05/20/17 Discussed in LOS 7/10 - pt  remains appropriate for continued stay.  Pt remains on milrinone and IV lasix drip - may require switch to dobutamine  05/13/17 CM confirmed with [facility] that prescription can be filled at discharge within normal operation hours  CM provided pt eliquis free 30 day card - benefit check submitted - pt has $0 copay and prior auth is not needed.  Pt states she uses [facility] and knows to get medication from [facility] on [address] if she discharges after 6pm during the week or on the weekend.  Pt is independent at home and states she is independently walking in room. Pt educated on daily weights and low salt diet restrictions.   Cherylann ParrClaxton, Thersa Mohiuddin S, RN 05/29/2017, 3:31 PM

## 2017-05-30 LAB — COOXEMETRY PANEL
CARBOXYHEMOGLOBIN: 1.3 % (ref 0.5–1.5)
Carboxyhemoglobin: 1.3 % (ref 0.5–1.5)
METHEMOGLOBIN: 1 % (ref 0.0–1.5)
Methemoglobin: 0.9 % (ref 0.0–1.5)
O2 SAT: 57.1 %
O2 SAT: 56.4 %
TOTAL HEMOGLOBIN: 13.9 g/dL (ref 12.0–16.0)
TOTAL HEMOGLOBIN: 12.7 g/dL (ref 12.0–16.0)

## 2017-05-30 LAB — GLUCOSE, CAPILLARY
Glucose-Capillary: 125 mg/dL — ABNORMAL HIGH (ref 65–99)
Glucose-Capillary: 134 mg/dL — ABNORMAL HIGH (ref 65–99)
Glucose-Capillary: 89 mg/dL (ref 65–99)

## 2017-05-30 LAB — CBC
HCT: 38.8 % (ref 36.0–46.0)
Hemoglobin: 12.8 g/dL (ref 12.0–15.0)
MCH: 28.6 pg (ref 26.0–34.0)
MCHC: 33 g/dL (ref 30.0–36.0)
MCV: 86.6 fL (ref 78.0–100.0)
PLATELETS: 271 10*3/uL (ref 150–400)
RBC: 4.48 MIL/uL (ref 3.87–5.11)
RDW: 16.7 % — AB (ref 11.5–15.5)
WBC: 4.1 10*3/uL (ref 4.0–10.5)

## 2017-05-30 LAB — BASIC METABOLIC PANEL
ANION GAP: 11 (ref 5–15)
BUN: 85 mg/dL — ABNORMAL HIGH (ref 6–20)
CALCIUM: 9.1 mg/dL (ref 8.9–10.3)
CO2: 33 mmol/L — ABNORMAL HIGH (ref 22–32)
Chloride: 83 mmol/L — ABNORMAL LOW (ref 101–111)
Creatinine, Ser: 1.99 mg/dL — ABNORMAL HIGH (ref 0.44–1.00)
GFR, EST AFRICAN AMERICAN: 29 mL/min — AB (ref >60)
GFR, EST NON AFRICAN AMERICAN: 25 mL/min — AB (ref >60)
Glucose, Bld: 143 mg/dL — ABNORMAL HIGH (ref 65–99)
POTASSIUM: 3.3 mmol/L — AB (ref 3.5–5.1)
Sodium: 127 mmol/L — ABNORMAL LOW (ref 135–145)

## 2017-05-30 MED ORDER — POTASSIUM CHLORIDE CRYS ER 20 MEQ PO TBCR: 20.0000 meq | EXTENDED_RELEASE_TABLET | Freq: Every day | ORAL | Status: DC

## 2017-05-30 MED ORDER — AMIODARONE HCL 200 MG PO TABS: 200.0000 mg | ORAL_TABLET | Freq: Every day | ORAL | 6 refills | Status: AC

## 2017-05-30 MED ORDER — SPIRONOLACTONE 25 MG PO TABS: 25.0000 mg | ORAL_TABLET | Freq: Every day | ORAL | Status: DC

## 2017-05-30 MED ORDER — METOLAZONE 2.5 MG PO TABS: 2.5000 mg | ORAL_TABLET | ORAL | 3 refills | Status: AC

## 2017-05-30 MED ORDER — TORSEMIDE 100 MG PO TABS: 100.0000 mg | ORAL_TABLET | Freq: Two times a day (BID) | ORAL | 6 refills | Status: AC

## 2017-05-30 MED ORDER — APIXABAN 5 MG PO TABS: 5.0000 mg | ORAL_TABLET | Freq: Two times a day (BID) | ORAL | 6 refills | Status: AC

## 2017-05-30 MED ORDER — POTASSIUM CHLORIDE CRYS ER 20 MEQ PO TBCR: 20.0000 meq | EXTENDED_RELEASE_TABLET | Freq: Every day | ORAL | 6 refills | Status: AC

## 2017-05-30 MED ORDER — SODIUM CHLORIDE 0.9 % IV BOLUS (SEPSIS)
250.0000 mL | Freq: Once | INTRAVENOUS | Status: AC
  Administered 2017-05-30: 250 mL via INTRAVENOUS

## 2017-05-30 MED ORDER — LACTULOSE 10 GM/15ML PO SOLN: 30.0000 g | Freq: Three times a day (TID) | ORAL | 6 refills | Status: AC

## 2017-05-30 MED ORDER — POTASSIUM CHLORIDE CRYS ER 20 MEQ PO TBCR
40.0000 meq | EXTENDED_RELEASE_TABLET | Freq: Once | ORAL | Status: AC
  Administered 2017-05-30: 40 meq via ORAL
  Filled 2017-05-30: qty 2

## 2017-05-30 NOTE — Progress Notes (Signed)
Patient discharged via [facility] belongings set aside for patients daughter.

## 2017-05-30 NOTE — Progress Notes (Addendum)
[facility]  Report given to receiving nurse Danise EdgeAshley Gilbert, all questions answered at this time.  Pt. VSS with no s/s of distress noted.  [facility] called awaiting transport.

## 2017-05-30 NOTE — Progress Notes (Signed)
  Pt orthostatic from 100 to 80 with standing with PT.  Torsemide already held.  Given 250 cc of NS.   Casimiro NeedleMichael [address]Andy" Zortmanillery, PA-C 05/30/2017 11:37 AM

## 2017-05-30 NOTE — Progress Notes (Signed)
Physical Therapy Treatment Patient Details Name: Christine Knight MRN: [id number] DOB: September 09, 1951 Today's Date: 05/30/2017    History of Present Illness Patient is a 66 y/o female who presents with heart failure and volume overload. CXR-Small left pleural effusion. Recently admitted 4/24-5/3 for fluid overload. Attempted DCCV on 7/16 but unable to complete due to thrombus and pt will continue on Eliquis and repeat the TEE in 4-6 weeks.  PMH includes morbid obesity, systolic HF due to ICM, CAD with hx of NSTEMI s/p CABG Oct 2012, pulmonary HTN, DM.    PT Comments    Pt admitted with above diagnosis. Pt currently with functional limitations due to balance and endurance deficits as well as dizziness today due to low BP.  Nurse aware. Exercise only today.  [facility] which is appropriate for monitoring.  Will follow acutely.  Pt will benefit from skilled PT to increase their independence and safety with mobility to allow discharge to the venue listed below.     Follow Up Recommendations  Supervision/Assistance - 24 hour;LTACH     Equipment Recommendations  Rolling walker with 5" wheels    Recommendations for Other Services OT consult     Precautions / Restrictions Precautions Precautions: Fall Restrictions Weight Bearing Restrictions: No    Mobility  Bed Mobility Overal bed mobility: Needs Assistance Bed Mobility: Sit to Supine       Sit to supine: Supervision   General bed mobility comments: Pt was able to get LES into bed without assist at end of treatment.  Transfers Overall transfer level: Needs assistance Equipment used: None Transfers: Sit to/from Stand Sit to Stand: Min guard         General transfer comment: increased time for powerup, guard assist for steadying as takes incr time due to pt weak as well as pt dizzy.   Ambulation/Gait Ambulation/Gait assistance: Min guard;Min assist Ambulation Distance (Feet): 3 Feet Assistive device: 1 person  hand held assist Gait Pattern/deviations: Decreased stride length;Decreased stance time - right;Shuffle;Step-to pattern;Wide base of support Gait velocity: decreased Gait velocity interpretation: Below normal speed for age/gender General Gait Details: Pt took a few steps and had to step back to bed as she was dizzy.  BP soft when taken.  Initial BP was 104/81 and when she stated she was dizzy and had to sit it was 97/71.  then took BP again because she stated it was worse and then BP 90/68.  Nursing made aware.  Assisted pt to supine and BP was 91/61.     Stairs            Wheelchair Mobility    Modified Rankin (Stroke Patients Only)       Balance Overall balance assessment: Needs assistance Sitting-balance support: Feet supported;No upper extremity supported Sitting balance-[name] Scale: Good     Standing balance support: During functional activity;Single extremity supported Standing balance-[name] Scale: Poor Standing balance comment: needed HHA assist to stand statically without RW                             Cognition Arousal/Alertness: Awake/alert Behavior During Therapy: Flat affect Overall Cognitive Status: Impaired/Different from baseline Area of Impairment: Problem solving;Safety/judgement;Following commands                       Following Commands: Follows one step commands with increased time Safety/Judgement: Decreased awareness of safety;Decreased awareness of deficits   Problem Solving: Slow  processing;Decreased initiation;Difficulty sequencing;Requires verbal cues;Requires tactile cues        Exercises General Exercises - Lower Extremity Ankle Circles/Pumps: AROM;Both;5 reps;Seated Long Arc Quad: AROM;Both;10 reps Hip Flexion/Marching: AROM;Both;Seated;5 reps    General Comments        Pertinent Vitals/Pain Pain Assessment: No/denies pain    Home Living                      Prior Function            PT Goals  (current goals can now be found in the care plan section) Progress towards PT goals: Not progressing toward goals - comment (dizziness due to low BP)    Frequency    Min 3X/week      PT Plan Discharge plan needs to be updated    Co-evaluation              AM-PAC PT "6 Clicks" Daily Activity  Outcome Measure  Difficulty turning over in bed (including adjusting bedclothes, sheets and blankets)?: A Little Difficulty moving from lying on back to sitting on the side of the bed? : A Little Difficulty sitting down on and standing up from a chair with arms (e.g., wheelchair, bedside commode, etc,.)?: A Little Help needed moving to and from a bed to chair (including a wheelchair)?: A Little Help needed walking in hospital room?: A Little Help needed climbing 3-5 steps with a railing? : A Lot 6 Click Score: 17    End of Session Equipment Utilized During Treatment: Gait belt Activity Tolerance: Patient limited by fatigue (limited by dizziness and low BP) Patient left: with call bell/phone within reach;in bed Nurse Communication: Mobility status PT Visit Diagnosis: Unsteadiness on feet (R26.81)     Time: 1610-96041012-1026 PT Time Calculation (min) (ACUTE ONLY): 14 min  Charges:  $Therapeutic Exercise: 8-22 mins                    G Codes:       Izzy Courville,PT Acute Rehabilitation [phone] (pager)    Berline Lopesawn F Zebedee Segundo 05/30/2017, 10:59 AM

## 2017-05-30 NOTE — Clinical Social Work Note (Signed)
CSW consulted for "Skilled nursing facility placement." Pt accepted by [facility] for LTACH. RNCM following for disposition. CSW signing off as no further Social Work needs identified. Please reconsult if new Social Work needs arise.   Corlis HoveJeneya Kherington Meraz, LCSWA, LCASA Clinical Social Work [phone]

## 2017-05-30 NOTE — Progress Notes (Signed)
Patient complaining of lightheadedness and dizziness while working with PT.  Pt. BP 97/61 pt. Continues to be symptomatic.  Contacted PA Tillery received an order for NS bolus.  Will continue to follow up.

## 2017-05-30 NOTE — Progress Notes (Signed)
Advanced HF Team Progress Note  Patient Name: Christine Knight Date of Encounter: 05/30/2017  Primary Cardiologist: D. Bensimhon, MD   Subjective   On 7/1/  RUE IV infiltrate.   Amio resumed 05/21/17 with RVR, but stopped that evening with AMS and severe fatigue. Similar reaction previously.   Dopamine started at 2.5 mcg/kg/min 05/22/17 with SBPs in 70s. Didn't help much so stopped on 7/13  TEE 05/26/17 with EF 25-30% with diffuse hypokinesis, moderately dilated RV with moderately decreased systolic function, moderate TR, mild to moderate MR, amorphous LAA thrombus. DCCV aborted.   Coox 57.1% on milrinone 0.125 mcg/kg/min.  CVP 9-10 Weight 200 lbs.   Frustrated with length of stay. Wants to leave hospital. Still willing to go to SNF. + lightheadedness this am.   Inpatient Medications    Scheduled Meds: . amiodarone  200 mg Oral Daily  . apixaban  5 mg Oral BID  . atorvastatin  40 mg Oral q1800  . insulin aspart  0-20 Units Subcutaneous TID WC  . insulin aspart  0-5 Units Subcutaneous QHS  . insulin aspart  5 Units Subcutaneous TID WC  . insulin glargine  8 Units Subcutaneous Daily  . lactulose  30 g Oral TID  . levothyroxine  50 mcg Oral QAC breakfast  . mouth rinse  15 mL Mouth Rinse BID  . pregabalin  100 mg Oral BID  . rifaximin  550 mg Oral BID  . [START ON 05/31/2017] spironolactone  25 mg Oral Daily   Continuous Infusions: . milrinone 0.125 mcg/kg/min (05/30/17 0326)   PRN Meds: acetaminophen, magnesium hydroxide, ondansetron (ZOFRAN) IV, sodium chloride flush, sodium chloride flush, traMADol   Vital Signs    Vitals:   05/30/17 0300 05/30/17 0328 05/30/17 0545 05/30/17 0700  BP: 102/76   (!) 101/59  Pulse:    82  Resp: 13 17 16 18   Temp:  (!) 97.5 F (36.4 C)  98.4 F (36.9 C)  TempSrc:  Oral  Oral  SpO2: 99% 99% 95% 100%  Weight:  200 lb 6.4 oz (90.9 kg)    Height:        Intake/Output Summary (Last 24 hours) at 05/30/17 0821 Last data filed at  05/30/17 0716  Gross per 24 hour  Intake           951.62 ml  Output             2350 ml  Net         -1398.38 ml   Filed Weights   05/28/17 0258 05/29/17 0314 05/30/17 0328  Weight: 203 lb 14.8 oz (92.5 kg) 201 lb 1 oz (91.2 kg) 200 lb 6.4 oz (90.9 kg)    Physical Exam   CVP 9-10  General: Fatigued.  No resp difficulty. HEENT: Normal Neck: Supple. Thick, JVP ~9 Carotids 2+ bilat; no bruits. No thyromegaly or nodule noted. Cor: PMI nondisplaced. Irregular. No M/G/R noted Lungs: CTAB, normal effort. Abdomen: Soft, non-tender, non-distended, no HSM. No bruits or masses. +BS  Extremities: No cyanosis, clubbing, or rash. R and LLE no edema.  Neuro: Alert & orientedx3, cranial nerves grossly intact. moves all 4 extremities w/o difficulty. Affect pleasant   Labs    Chemistry  Recent Labs Lab 05/28/17 0450 05/29/17 0435 05/30/17 0438  NA 127* 129* 127*  K 3.1* 4.1 3.3*  CL 85* 85* 83*  CO2 32 33* 33*  GLUCOSE 269* 192* 143*  BUN 76* 80* 85*  CREATININE 1.57* 1.85* 1.99*  CALCIUM 8.4* 9.2  9.1  GFRNONAA 34* 27* 25*  GFRAA 39* 32* 29*  ANIONGAP 10 11 11      Hematology  Recent Labs Lab 05/28/17 0450 05/29/17 0435 05/30/17 0438  WBC 4.4 4.7 4.1  RBC 4.22 4.68 4.48  HGB 11.8* 13.1 12.8  HCT 36.7 40.3 38.8  MCV 87.0 86.1 86.6  MCH 28.0 28.0 28.6  MCHC 32.2 32.5 33.0  RDW 17.0* 16.8* 16.7*  PLT 280 262 271    Cardiac Enzymes No results for input(s): TROPONINI in the last 168 hours.  No results for input(s): TROPIPOC in the last 168 hours.   BNP No results for input(s): BNP, PROBNP in the last 168 hours.   Radiology    No results found.   Telemetry    Personally reviewed, A fib 80-90s     Patient Profile     Christine Knight is a 66 y.o. female w/ PMHx significant for morbid obesity, systolic HF due to ICM (10/12 EF 25%; Echo 6/17 EF 15-20%), CAD with h/o NSTEMI s/p CABG Oct 2012, CKD III, severe pulmonary hypertension, PAF, dietary noncompliance, and  poorly controlled DM 2, admitted 6/30 with acute on chronic HFrEF and wt gain.  Assessment & Plan    1. Acute on chronic biventricular HF (R>>L):  EF 15-20% 04/14/2016.  Presented 6/30 with marked volume overload and wt gain (up over 30 lbs since 04/16/17) in the setting of eating out frequently.  TEE 7/16 with EF 25-30% and moderate RV dysfunction.  -- Volume status back to baseline or lower. Creatinine trending up.  - CVP 8-9. Hold torsemide today for home.  -- Suspect AF is playing a major role here. Unable to cardiovert with new LAA thrombus. Plan re-attempt in 4-6 weeks. -- Coox 57% on milrinone 0.125. Have no planned for home with poor compliance. Will stop and recheck coox.  -- Creatinine and BUN stable.   -- Did not respond to dopamine. -- Hyponatremia has improved overall. Got tolvaptan 7/14. No need to re-dose at this point.  -- No  blocker/acei/arb/arni due to soft bp's and low output.  2.  RUE IV infiltration:  7/1 [place] was neg for dvt.   -- Resolved.     3.  CAD/Chest pressure/elevated troponin:  Mild trop elevation @ 0.05.   -- Denies CP. No change to medical management. Known to have severe native CAD with patent grafts on cath 12/2015.  -- Cont ASA and statin.    4.  PAF:   -- Remains in afib.  Rate stable. Unable to DCCV with new LAA thrombus by TEE 05/26/17. -- Plan to continue Eliquis with re-attempt TEE/DCCV in 3-4 weeks.  -- Intolerant to IV amiodarone with severe fatigue. Now tolerating by mouth amiodarone 200 bid- we will continue -- Continue eliquis 5 mg twice a day.  (CHA2DS2VASc = 5). No bleeding. No change.    5.  Acute on CKD III:   -- Creatinine elevated again. Hold torsemide today. Pt is down a total of 40 lbs this admit.  -- Will supp K.    6.  DM II:   - Cont SSI. Appreciate diabetes coordinator input. No change.   7.  OSA:  Severe OSA on sleep study 12/25/16.   -- Needs CPAP. No change.   8.  Morbid obesity: Body mass index is 32.35 kg/m. -- Needs  outpatient dietary consultation. No change.    9. PVCs -- Goal K > 4.0 and Mg > 2.0 Stable. No change.   10. Hyponatremia: -- 127  this am. Continue free water restrict. Again counseled on fluid restriction at home.    -- Given a dose of tolvaptan 7/14. Can redose as needed. Discussed with PharmD personally.   11. Hepatic encephalopathy -- Ammonia 66 05/29/17. Lactulose increased.  -- Continue lactulose and xifaxin. No change.     12. DNR  Stop milrinone. Hold torsemide. Hopefully to SNF this afternoon. Will need BMET next week if out today.   [name], PA-C  05/30/2017, 8:21 AM    Advanced Heart Failure Team Pager [phone] (M-F; 7a - 4p)  Please contact [facility] for night-coverage after hours (4p -7a ) and weekends on [email or web address]  Patient seen with PA, agree with the above note.  CVP remains 9-10 with mild rise in creatinine to 1.99.  She feels about the same. With her RV failure, I think that CVP 9-10 will be our goal.  - I will stop milrinone today.  - Hold torsemide today and allow her to equilibrate with rise in creatinine.   - Resume torsemide tomorrow, would use 100 mg bid.   She has been accepted at [facility], she should be able to go today.  We will leave PICC in in case they want to restart milrinone.   Marca Ancona 05/30/2017 11:50 AM

## 2017-05-30 NOTE — Care Management Note (Addendum)
Case Management Note  Patient Details  Name: Christine Knight MRN: [id number] Date of Birth: 1951-11-02  Subjective/Objective:    Pt admitted with HF                Action/Plan:   PTA from home.  [facility] and PT.  CM submitted benefit check for eliquis   Expected Discharge Date:  05/30/17               Expected Discharge Plan:  Home w Home Health Services  In-House Referral:     Discharge planning Services  CM Consult  Post Acute Care Choice:    Choice offered to:     DME Arranged:    DME Agency:     HH Arranged:    HH Agency:     Status of Service:     If discussed at [employer] Meetings, dates discussed:    Additional Comments: 05/30/2017  Pt remains in [facility].  Pt to discharge to [facility] today via [facility].  Bedside nurse provided transport packet along with specifics for [facility].  Dr Nelson ChimesAmin is the accepted doctor, Room 202 and report to be called to [phone].   05/29/17 Pt is currently weaning off of milrinone - all other IV medications discontinued.  LTACH referral requested by both Physician Advisor and attending - both agencies given referral - [facility] declined to offer bed, [facility] will offer bed.  CM spoke with pt (alert and oriented) [facility] and is in agreement with [facility].  [facility] will accept the pt on the basis; pt needs close monitoring for HF and will likely require reinitiation of inotrope/IV diuretics  05/28/17 Pt is less alert and per bedside nurse mental status has been deteroiating over last week with PO Amio - pt now being recommended for SNF - CSW consulted.  HF team restricted on medications for A fib due to HF.  CM will continue to follow for discharge needs  05/27/17 Discussed in LOS 05/27/17 - pt remains appropriate for continued stay - pt remains on milrinone and lasix drip.  Pt is still volume overload by  approx 4 lbs.  Plan is to wean of drips and switch to PO within next 48 hours.  PT recommends no PT followup  05/22/17 Discussed in LOS 05/22/17 - remains appropriate for stay .  Pt remains volume overloaded.  Per attending pt may need CPAP - requested order  05/20/17 Discussed in LOS 7/10 - pt remains appropriate for continued stay.  Pt remains on milrinone and IV lasix drip - may require switch to dobutamine  05/13/17 CM confirmed with [facility] that prescription can be filled at discharge within normal operation hours  CM provided pt eliquis free 30 day card - benefit check submitted - pt has $0 copay and prior auth is not needed.  Pt states she uses [facility] and knows to get [facility] [address] if she discharges after 6pm during the week or on the weekend.  Pt is independent at home and states she is independently walking in room. Pt educated on daily weights and low salt diet restrictions.   Cherylann ParrClaxton, Christine Errickson S, RN 05/30/2017, 2:46 PM

## 2017-05-30 NOTE — Discharge Summary (Addendum)
Advanced Heart Failure Discharge Note  Discharge Summary   Patient ID: Christine Knight MRN: [id number], DOB/AGE: 1951-01-31 66 y.o. Admit date: 05/10/2017 D/C date:     05/30/2017   Primary Discharge Diagnoses:  1. Acute on chronic systolic CHF 2. RUE IV infiltration 3. CAD 4. Paroxysmal Afib 5. ARF on CKD III 6. DMII 7. OSA 8. Morbid obesity 9 PVCs 10. Hyponatremia 11. Hepatic Encephalopathy 12. Hypokalemia 13. DNR  Hospital Course:   Christine Knight is a 66 y.o. female w/ PMHx significant for morbid obesity, systolic HF due to ICM (04/2016 EF 15-20%), CAD with h/o NSTEM s/p CABG 08/2011, CKD III, severe pulmonary hypertension, PAF, and poorly controlled DM2.   Pt admitted 05/10/17 with marked volume overload in setting of marked dietary non-compliance and questionable medication compliance. She had gained 40 lbs in ~ 3 weeks. Failed titration of outpatient diuretics.   Pt placed on high dose lasix gtt at 30 mg/hr along with metolazone 5 mg BID and milrinone 0.25 mcg/kg/min to augment diuresis. CVP and Coox followed throughout admission. Pt had chest pressure on admission with mild troponin elevated that did not trend up. Suspected to be related to her marked volume overload.   Pt initially diuresed well, but renal function began to climb in setting of increased afib burden.  Pt placed on IV amiodarone on 2 separate occasions. She developed lethargy and soft BP on IV amio both times, so  this was abandoned and placed on po amiodarone.  Pt remained in Afib and TEE/DCCV planned for 05/26/17.  Unfortunately, TEE 05/26/17 was significant for LAA thrombus, preventing cardioversion. She had only recently been started on Eliquis this admission, so was not adequately anti-coagulated. Plans to continue anti-coag and repeat TEE/DCCV in the near future (3-4 weeks)  Hospital course also complicated by RUE IV infiltration. [place] 05/11/17 negative for DVT.  Pt initially had skin breakdown that improved  with gentle care and warm compresses for swelling. Pt had no permanent loss of function or sensation.   Pt was hypokalemic on multiple occasions with aggressive diuresis, which improved with supplementation.  Pt also received one dose of Tolvaptan 05/24/17.  Pt ammonia noted to increase with lethargy, so patients chronic lactulose increased with improvement.   In disposition discussions with Case Management, SNF vs LTACH was suggested. LTACH agreed to take patient due to her chronic illness with frequent exacerbations. LTACH requested PICC line be left in place for consideration of additional invasive monitoring and/or milrinone.   Overall patient diuresed 34.3 L and down 41 lbs from highest weight this admission. Was considered  dry with mild orthostasis on day of discharge. Diuretics held and given gentle IVF. Considered stable for discharge with improved symptoms and relatively stable CKD.   Pt will be discharged in stable condition to [facility]. She will have follow up as below.   [facility] should check BMET TOMORROW, 05/31/17 to follow Potassium after supp today.   Discharge Weight Range: 200 lbs Discharge Vitals: Blood pressure 91/64, pulse 81, temperature 98.1 F (36.7 C), temperature source Oral, resp. rate 16, height 5\' 6"  (1.676 m), weight 200 lb 6.4 oz (90.9 kg), SpO2 97 %.  Labs: Lab Results  Component Value Date   WBC 4.1 05/30/2017   HGB 12.8 05/30/2017   HCT 38.8 05/30/2017   MCV 86.6 05/30/2017   PLT 271 05/30/2017     Recent Labs Lab 05/30/17 0438  NA 127*  K 3.3*  CL 83*  CO2 33*  BUN 85*  CREATININE 1.99*  CALCIUM 9.1  GLUCOSE 143*   Lab Results  Component Value Date   CHOL 136 12/09/2015   HDL 26 (L) 12/09/2015   LDLCALC 94 12/09/2015   TRIG 82 12/09/2015   BNP (last 3 results)  Recent Labs  03/04/17 1013 04/08/17 1623 05/10/17 1148  BNP 1,209.6* 823.3* 669.7*    ProBNP (last 3 results) No results for input(s): PROBNP in the last 8760  hours.   Diagnostic Studies/Procedures   No results found.  Discharge Medications   Allergies as of 05/30/2017      Reactions   Lisinopril Nausea And Vomiting, Other (See Comments), Cough   Coughing, throwing up, waking up coughing   Metformin And Related Anaphylaxis   Amiodarone Nausea Only      Medication List    STOP taking these medications   aspirin EC 81 MG tablet     TAKE these medications   acetaminophen 650 MG CR tablet Commonly known as:  TYLENOL Take 650 mg by mouth every 8 (eight) hours as needed (for arthritic pain).   amiodarone 200 MG tablet Commonly known as:  PACERONE Take 1 tablet (200 mg total) by mouth daily.   apixaban 5 MG Tabs tablet Commonly known as:  ELIQUIS Take 1 tablet (5 mg total) by mouth 2 (two) times daily.   atorvastatin 40 MG tablet Commonly known as:  LIPITOR Take 1 tablet (40 mg total) by mouth daily.   CENTRUM SILVER 50+WOMEN PO Take 1 tablet by mouth every morning.   lactulose 10 GM/15ML solution Commonly known as:  CHRONULAC Take 45 mLs (30 g total) by mouth 3 (three) times daily. What changed:  when to take this   levothyroxine 50 MCG tablet Commonly known as:  SYNTHROID, LEVOTHROID Take 1 tablet (50 mcg total) by mouth daily before breakfast.   metolazone 2.5 MG tablet Commonly known as:  ZAROXOLYN Take 1 tablet (2.5 mg total) by mouth as directed. As needed ONLY. As directed by AHF clinic. What changed:  additional instructions   potassium chloride SA 20 MEQ tablet Commonly known as:  K-DUR,KLOR-CON Take 1 tablet (20 mEq total) by mouth daily.   pregabalin 100 MG capsule Commonly known as:  LYRICA Take 100 mg by mouth 2 (two) times daily.   rifaximin 550 MG Tabs tablet Commonly known as:  XIFAXAN Take 1 tablet (550 mg total) by mouth 2 (two) times daily.   spironolactone 25 MG tablet Commonly known as:  ALDACTONE Take 1 tablet (25 mg total) by mouth daily.   torsemide 100 MG tablet Commonly known as:   DEMADEX Take 1 tablet (100 mg total) by mouth 2 (two) times daily. What changed:  medication strength  how much to take       Disposition   The patient will be discharged in stable condition to [facility]. Discharge Instructions    (HEART FAILURE PATIENTS) Call MD:  Anytime you have any of the following symptoms: 1) 3 pound weight gain in 24 hours or 5 pounds in 1 week 2) shortness of breath, with or without a dry hacking cough 3) swelling in the hands, feet or stomach 4) if you have to sleep on extra pillows at night in order to breathe.    Complete by:  As directed    Diet - low sodium heart healthy    Complete by:  As directed    Heart Failure patients record your daily weight using the same scale at the same time of day  Complete by:  As directed    Increase activity slowly    Complete by:  As directed      Follow-up Information    [facility] Follow up on 06/19/2017.   Specialty:  Cardiology Why:  at 0830 for post hospital follow up. Please bring all of your medications or a list to your appointment. Code for parking is 7001. Contact information: [address] [id number] [place] [id number] [phone]            Duration of Discharge Encounter: Greater than 35 minutes   Signed, Luane SchoolMichael Andrew Erykah Lippert, PA-C 05/30/2017, 2:05 PM

## 2017-06-02 ENCOUNTER — Telehealth (HOSPITAL_COMMUNITY): Payer: Self-pay | Admitting: *Deleted

## 2017-06-02 NOTE — Telephone Encounter (Signed)
Patient's son Lamonte RicherMaurice Quick called asking to speak to Tonye BecketAmy Clegg, NP about hospice care.  I called him back and told him that Amy would be out all week and he said he had been speaking with her about hospice care for his mom.  He stated he is ready for us to place referral for hospice.   [facility].

## 2017-06-16 ENCOUNTER — Telehealth (HOSPITAL_COMMUNITY): Payer: Self-pay | Admitting: Vascular Surgery

## 2017-06-16 NOTE — Telephone Encounter (Signed)
Left pt message that appt 06/19/17 @ 830  Has been change to 06/20/17 @ 8:30 ASKED PT TO CALL BACK TO CONFIRM APPT

## 2017-06-19 ENCOUNTER — Encounter (HOSPITAL_COMMUNITY): Payer: Medicare Other

## 2017-06-19 ENCOUNTER — Telehealth (HOSPITAL_COMMUNITY): Payer: Self-pay

## 2017-06-19 NOTE — Telephone Encounter (Signed)
[facility] to clarify medication/allergy. Patient on 200 mg amiodarone once daily but his it listed as an allergy. Clarified this is an intolerance due to reported nausea by the patient however patient has been taking medication per our records.  Ave FilterBradley, Jaron Czarnecki Genevea, RN

## 2017-06-20 ENCOUNTER — Encounter (HOSPITAL_COMMUNITY): Payer: Medicare Other

## 2017-07-21 ENCOUNTER — Telehealth (HOSPITAL_COMMUNITY): Payer: Self-pay | Admitting: *Deleted

## 2017-07-24 ENCOUNTER — Encounter (HOSPITAL_COMMUNITY): Payer: Self-pay | Admitting: *Deleted

## 2017-07-24 NOTE — Progress Notes (Signed)
DC completed and signed by Dr Gala RomneyBensimhon, given to [facility]

## 2017-08-11 NOTE — Telephone Encounter (Signed)
Pam called to report patients time of death today at 12:50pm.   Routed to Dr.Bensimhon

## 2017-08-11 DEATH — deceased

## 2017-10-29 IMAGING — CR DG CHEST 2V
2 series · 2 of 2 positions shown · non-contrast
Comparison: 03/04/2017

CLINICAL DATA: Patient reports left sided chest pain with SOB X 1
week. HX heart attack, diabetes

EXAM:
CHEST  2 VIEW

[chest pa]
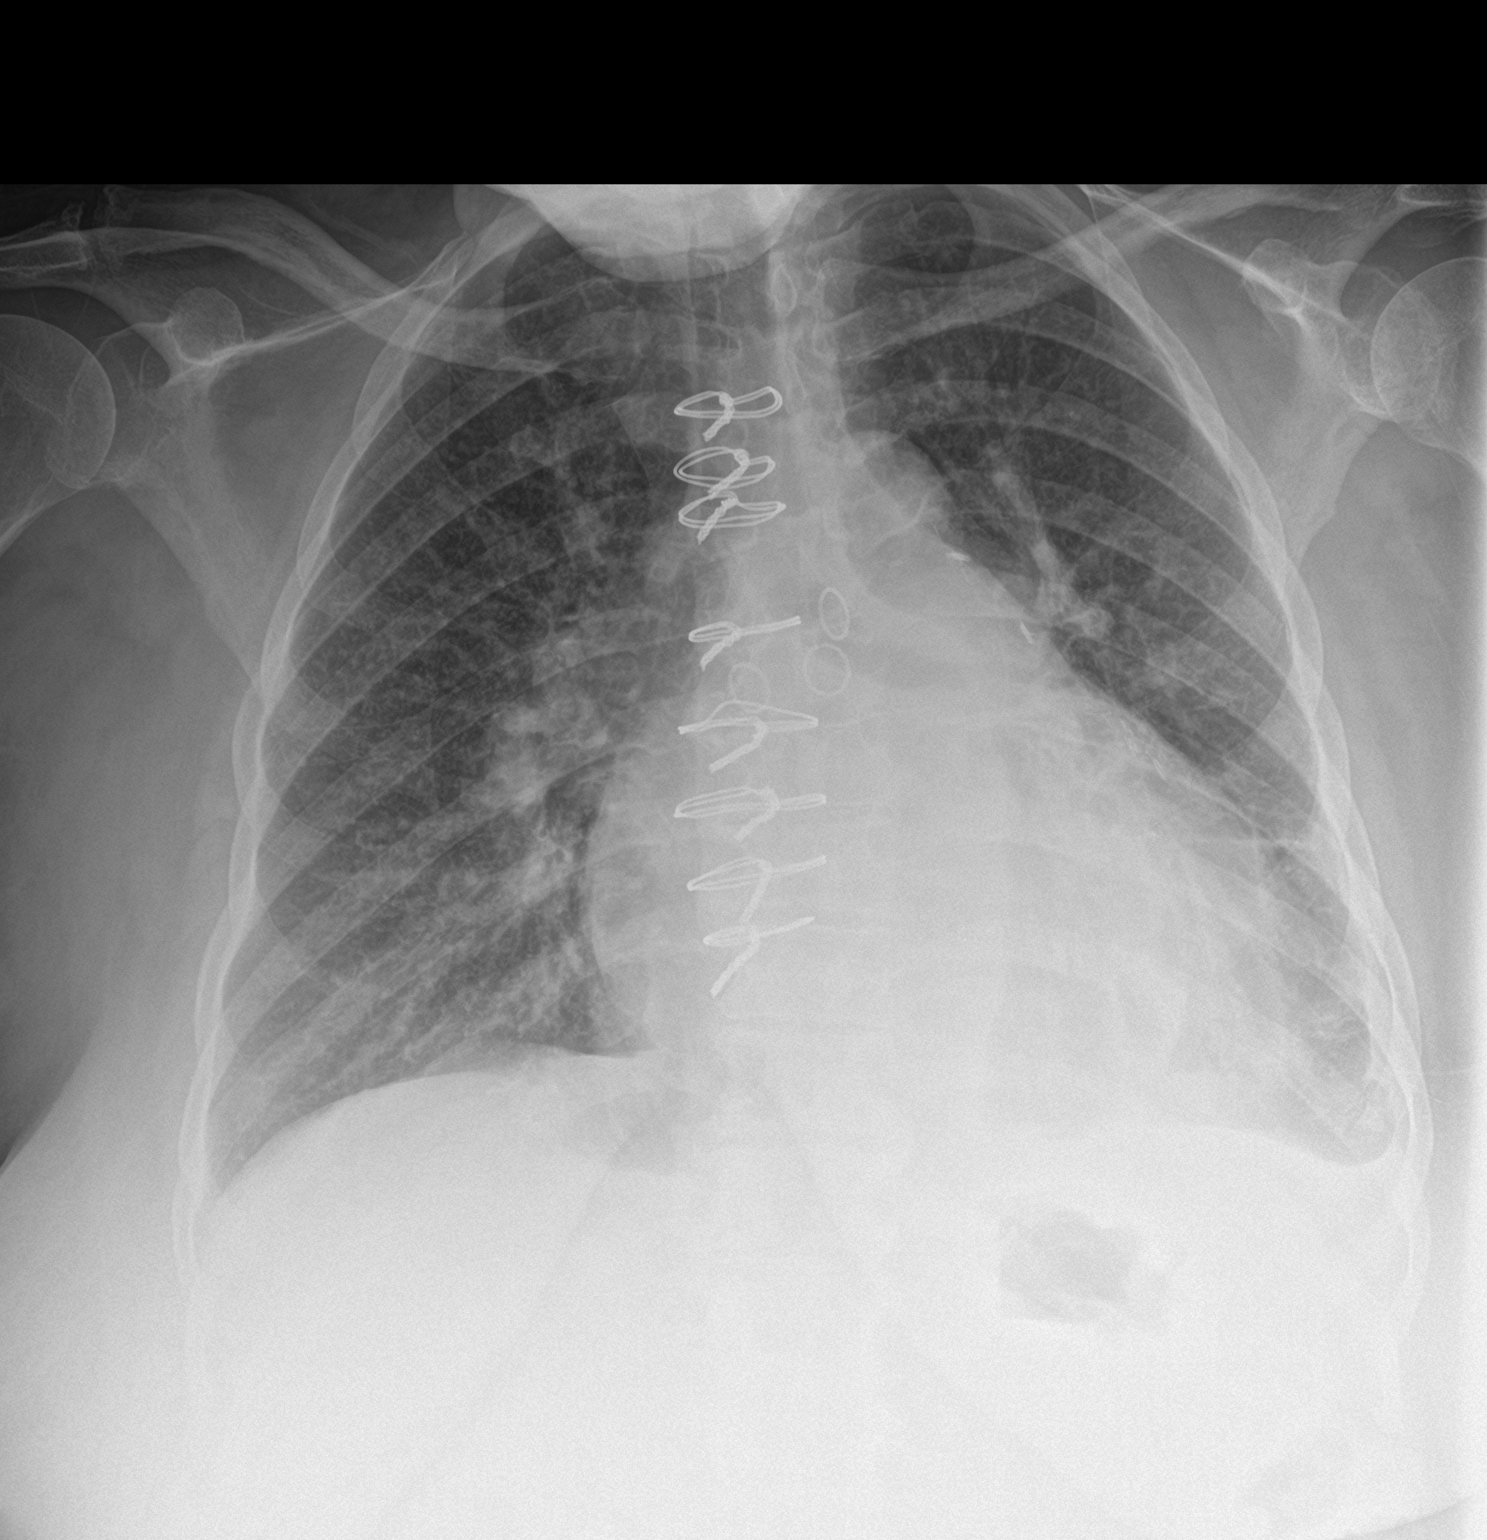

[chest lat]
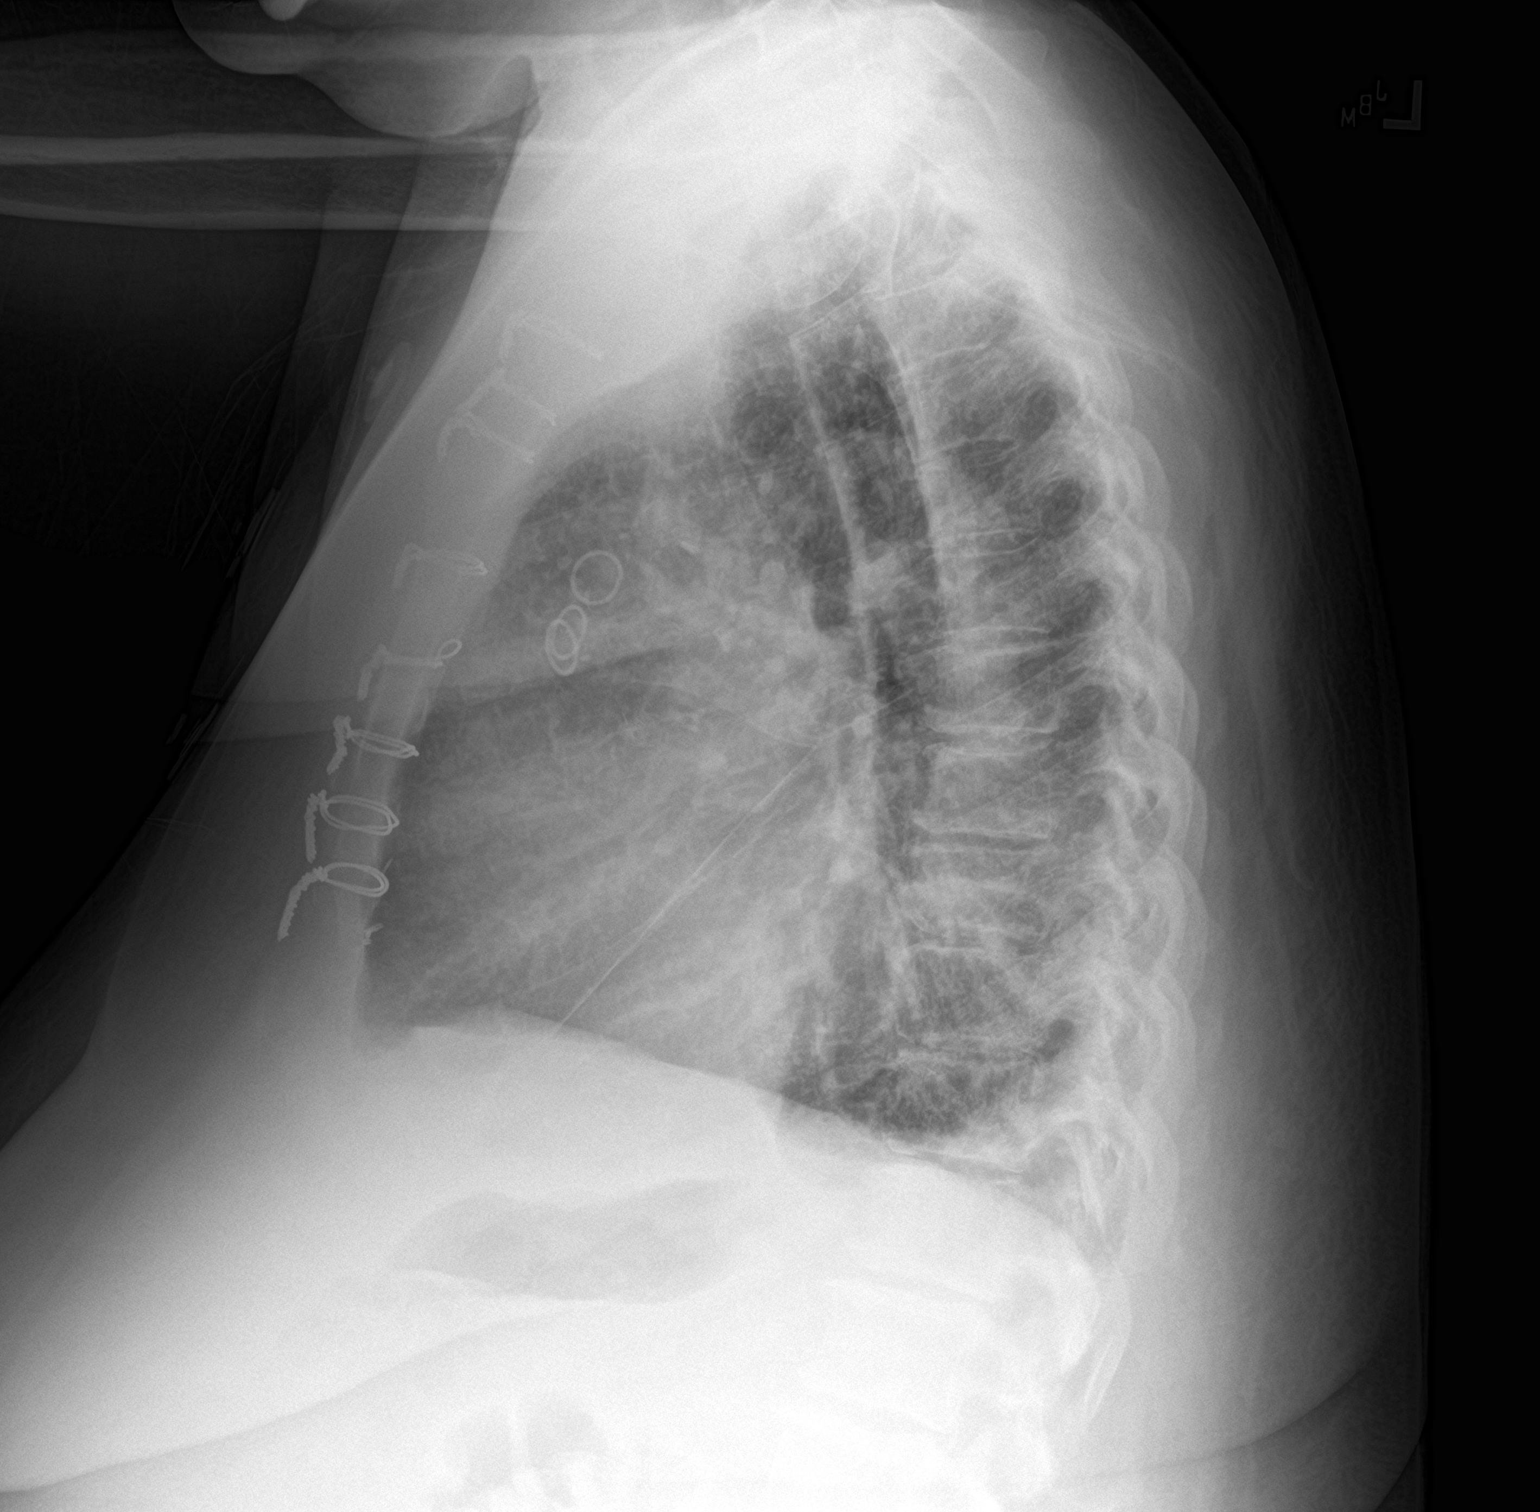

[2 of 2 positions shown; findings below may reference images not displayed]

FINDINGS: There stable changes from prior CABG surgery. Cardiac silhouette is
mildly enlarged.

No mediastinal or hilar masses.

Mild vascular congestion with mild interstitial thickening. There is
a small left pleural effusion, stable from the prior exam.

No pneumothorax.

Skeletal structures are grossly intact.
IMPRESSION: 1. Findings consistent with mild congestive heart failure with
cardiomegaly, vascular congestion and mild interstitial thickening.
Small left pleural effusion is stable from the prior exam.
